# Patient Record
Sex: Male | Born: 1940
Health system: Southern US, Community
[De-identification: ages and names within clinical notes are randomized; demographics above are authoritative.]

## PROBLEM LIST (undated history)

## (undated) DIAGNOSIS — E876 Hypokalemia: Secondary | ICD-10-CM

## (undated) DIAGNOSIS — M1990 Unspecified osteoarthritis, unspecified site: Secondary | ICD-10-CM

## (undated) DIAGNOSIS — Z8679 Personal history of other diseases of the circulatory system: Secondary | ICD-10-CM

## (undated) DIAGNOSIS — I10 Essential (primary) hypertension: Secondary | ICD-10-CM

## (undated) DIAGNOSIS — K759 Inflammatory liver disease, unspecified: Secondary | ICD-10-CM

## (undated) DIAGNOSIS — M109 Gout, unspecified: Secondary | ICD-10-CM

## (undated) DIAGNOSIS — K219 Gastro-esophageal reflux disease without esophagitis: Secondary | ICD-10-CM

## (undated) DIAGNOSIS — G629 Polyneuropathy, unspecified: Secondary | ICD-10-CM

## (undated) DIAGNOSIS — N179 Acute kidney failure, unspecified: Secondary | ICD-10-CM

## (undated) DIAGNOSIS — N189 Chronic kidney disease, unspecified: Secondary | ICD-10-CM

## (undated) DIAGNOSIS — F419 Anxiety disorder, unspecified: Secondary | ICD-10-CM

## (undated) DIAGNOSIS — E119 Type 2 diabetes mellitus without complications: Secondary | ICD-10-CM

## (undated) DIAGNOSIS — M519 Unspecified thoracic, thoracolumbar and lumbosacral intervertebral disc disorder: Secondary | ICD-10-CM

## (undated) DIAGNOSIS — I480 Paroxysmal atrial fibrillation: Secondary | ICD-10-CM

## (undated) DIAGNOSIS — N400 Enlarged prostate without lower urinary tract symptoms: Secondary | ICD-10-CM

## (undated) DIAGNOSIS — G4730 Sleep apnea, unspecified: Secondary | ICD-10-CM

## (undated) DIAGNOSIS — E860 Dehydration: Secondary | ICD-10-CM

## (undated) DIAGNOSIS — D649 Anemia, unspecified: Secondary | ICD-10-CM

## (undated) DIAGNOSIS — E785 Hyperlipidemia, unspecified: Secondary | ICD-10-CM

## (undated) DIAGNOSIS — H269 Unspecified cataract: Secondary | ICD-10-CM

## (undated) DIAGNOSIS — J45909 Unspecified asthma, uncomplicated: Secondary | ICD-10-CM

## (undated) DIAGNOSIS — T7840XA Allergy, unspecified, initial encounter: Secondary | ICD-10-CM

## (undated) DIAGNOSIS — I509 Heart failure, unspecified: Secondary | ICD-10-CM

## (undated) DIAGNOSIS — I2510 Atherosclerotic heart disease of native coronary artery without angina pectoris: Secondary | ICD-10-CM

## (undated) DIAGNOSIS — J449 Chronic obstructive pulmonary disease, unspecified: Secondary | ICD-10-CM

## (undated) DIAGNOSIS — L03211 Cellulitis of face: Secondary | ICD-10-CM

## (undated) DIAGNOSIS — IMO0001 Reserved for inherently not codable concepts without codable children: Secondary | ICD-10-CM

## (undated) DIAGNOSIS — M353 Polymyalgia rheumatica: Secondary | ICD-10-CM

## (undated) DIAGNOSIS — C649 Malignant neoplasm of unspecified kidney, except renal pelvis: Secondary | ICD-10-CM

## (undated) DIAGNOSIS — E669 Obesity, unspecified: Secondary | ICD-10-CM

## (undated) HISTORY — DX: Essential (primary) hypertension: I10

## (undated) HISTORY — PX: CARDIAC CATHETERIZATION: SHX172

## (undated) HISTORY — DX: Anemia, unspecified: D64.9

## (undated) HISTORY — DX: Unspecified thoracic, thoracolumbar and lumbosacral intervertebral disc disorder: M51.9

## (undated) HISTORY — DX: Type 2 diabetes mellitus without complications: E11.9

## (undated) HISTORY — PX: APPENDECTOMY: SHX54

## (undated) HISTORY — DX: Hyperlipidemia, unspecified: E78.5

## (undated) HISTORY — DX: Obesity, unspecified: E66.9

## (undated) HISTORY — DX: Gout, unspecified: M10.9

## (undated) HISTORY — DX: Unspecified asthma, uncomplicated: J45.909

## (undated) HISTORY — DX: Unspecified osteoarthritis, unspecified site: M19.90

## (undated) HISTORY — DX: Allergy, unspecified, initial encounter: T78.40XA

## (undated) HISTORY — DX: Heart failure, unspecified: I50.9

## (undated) HISTORY — DX: Hypokalemia: E87.6

## (undated) HISTORY — DX: Chronic obstructive pulmonary disease, unspecified: J44.9

## (undated) HISTORY — DX: Acute kidney failure, unspecified: N17.9

## (undated) HISTORY — DX: Personal history of other diseases of the circulatory system: Z86.79

## (undated) HISTORY — DX: Gastro-esophageal reflux disease without esophagitis: K21.9

## (undated) HISTORY — PX: TRANSURETHRAL RESECTION OF PROSTATE: SHX73

## (undated) HISTORY — DX: Polyneuropathy, unspecified: G62.9

## (undated) HISTORY — DX: Paroxysmal atrial fibrillation: I48.0

## (undated) HISTORY — DX: Polymyalgia rheumatica: M35.3

## (undated) HISTORY — DX: Benign prostatic hyperplasia without lower urinary tract symptoms: N40.0

## (undated) HISTORY — PX: HERNIA REPAIR: SHX51

## (undated) HISTORY — DX: Dehydration: E86.0

## (undated) HISTORY — DX: Cellulitis of face: L03.211

## (undated) HISTORY — DX: Unspecified cataract: H26.9

## (undated) HISTORY — PX: PROSTATE SURGERY: SHX751

---

## 1995-03-17 HISTORY — PX: CORONARY ARTERY BYPASS GRAFT: SHX141

## 2008-07-01 ENCOUNTER — Emergency Department (HOSPITAL_COMMUNITY): Admission: EM | Admit: 2008-07-01 | Discharge: 2008-07-01 | Payer: Self-pay | Admitting: Emergency Medicine

## 2008-07-02 ENCOUNTER — Emergency Department (HOSPITAL_COMMUNITY): Admission: EM | Admit: 2008-07-02 | Discharge: 2008-07-02 | Payer: Self-pay | Admitting: Emergency Medicine

## 2008-11-02 ENCOUNTER — Emergency Department (HOSPITAL_COMMUNITY): Admission: EM | Admit: 2008-11-02 | Discharge: 2008-11-02 | Payer: Self-pay | Admitting: Emergency Medicine

## 2008-11-02 ENCOUNTER — Encounter (INDEPENDENT_AMBULATORY_CARE_PROVIDER_SITE_OTHER): Payer: Self-pay | Admitting: Emergency Medicine

## 2008-11-02 ENCOUNTER — Ambulatory Visit: Payer: Self-pay | Admitting: Vascular Surgery

## 2010-06-19 ENCOUNTER — Emergency Department (HOSPITAL_COMMUNITY)
Admission: EM | Admit: 2010-06-19 | Discharge: 2010-06-20 | Disposition: A | Discharge status: Home / Self Care | Payer: Medicare Other | Attending: Emergency Medicine | Admitting: Emergency Medicine

## 2010-06-19 ENCOUNTER — Emergency Department (HOSPITAL_COMMUNITY): Payer: Medicare Other

## 2010-06-19 DIAGNOSIS — I10 Essential (primary) hypertension: Secondary | ICD-10-CM | POA: Insufficient documentation

## 2010-06-19 DIAGNOSIS — M129 Arthropathy, unspecified: Secondary | ICD-10-CM | POA: Insufficient documentation

## 2010-06-19 DIAGNOSIS — IMO0001 Reserved for inherently not codable concepts without codable children: Secondary | ICD-10-CM | POA: Insufficient documentation

## 2010-06-19 DIAGNOSIS — R0602 Shortness of breath: Secondary | ICD-10-CM | POA: Insufficient documentation

## 2010-06-19 DIAGNOSIS — R05 Cough: Secondary | ICD-10-CM | POA: Insufficient documentation

## 2010-06-19 DIAGNOSIS — R0982 Postnasal drip: Secondary | ICD-10-CM | POA: Insufficient documentation

## 2010-06-19 DIAGNOSIS — D869 Sarcoidosis, unspecified: Secondary | ICD-10-CM | POA: Insufficient documentation

## 2010-06-19 DIAGNOSIS — R059 Cough, unspecified: Secondary | ICD-10-CM | POA: Insufficient documentation

## 2010-06-19 DIAGNOSIS — E119 Type 2 diabetes mellitus without complications: Secondary | ICD-10-CM | POA: Insufficient documentation

## 2010-06-20 ENCOUNTER — Emergency Department (HOSPITAL_COMMUNITY): Payer: Medicare Other

## 2010-06-20 LAB — GLUCOSE, CAPILLARY: Glucose-Capillary: 135 mg/dL — ABNORMAL HIGH (ref 70–99)

## 2010-06-21 LAB — COMPREHENSIVE METABOLIC PANEL
ALT: 19 U/L (ref 0–53)
AST: 18 U/L (ref 0–37)
Albumin: 3.7 g/dL (ref 3.5–5.2)
Alkaline Phosphatase: 102 U/L (ref 39–117)
BUN: 15 mg/dL (ref 6–23)
CO2: 27 mEq/L (ref 19–32)
Calcium: 9.2 mg/dL (ref 8.4–10.5)
Chloride: 101 mEq/L (ref 96–112)
Creatinine, Ser: 1.39 mg/dL (ref 0.4–1.5)
GFR calc Af Amer: >60 mL/min (ref >60)
GFR calc non Af Amer: 51 mL/min — ABNORMAL LOW (ref >60)
Glucose, Bld: 228 mg/dL — ABNORMAL HIGH (ref 70–99)
Potassium: 3.5 mEq/L (ref 3.5–5.1)
Sodium: 136 mEq/L (ref 135–145)
Total Bilirubin: 1.1 mg/dL (ref 0.3–1.2)
Total Protein: 7.5 g/dL (ref 6.0–8.3)

## 2010-06-21 LAB — DIFFERENTIAL
Basophils Absolute: 0 10*3/uL (ref 0.0–0.1)
Basophils Relative: 0 % (ref 0–1)
Eosinophils Absolute: 0.2 10*3/uL (ref 0.0–0.7)
Eosinophils Relative: 2 % (ref 0–5)
Lymphocytes Relative: 14 % (ref 12–46)
Lymphs Abs: 1.1 10*3/uL (ref 0.7–4.0)
Monocytes Absolute: 0.7 10*3/uL (ref 0.1–1.0)
Monocytes Relative: 9 % (ref 3–12)
Neutro Abs: 5.8 10*3/uL (ref 1.7–7.7)
Neutrophils Relative %: 75 % (ref 43–77)

## 2010-06-21 LAB — CBC
HCT: 32.8 % — ABNORMAL LOW (ref 39.0–52.0)
Hemoglobin: 11.6 g/dL — ABNORMAL LOW (ref 13.0–17.0)
MCHC: 35.4 g/dL (ref 30.0–36.0)
MCV: 95.4 fL (ref 78.0–100.0)
Platelets: 272 10*3/uL (ref 150–400)
RBC: 3.43 MIL/uL — ABNORMAL LOW (ref 4.22–5.81)
RDW: 12.2 % (ref 11.5–15.5)
WBC: 7.8 10*3/uL (ref 4.0–10.5)

## 2010-06-21 LAB — POCT CARDIAC MARKERS
CKMB, poc: <1 ng/mL — ABNORMAL LOW (ref 1.0–8.0)
Myoglobin, poc: 71.8 ng/mL (ref 12–200)
Troponin i, poc: <0.05 ng/mL (ref 0.00–0.09)

## 2010-06-21 LAB — CK: Total CK: 32 U/L (ref 7–232)

## 2010-06-25 LAB — RAPID URINE DRUG SCREEN, HOSP PERFORMED
Amphetamines: NOT DETECTED
Barbiturates: NOT DETECTED
Benzodiazepines: POSITIVE — AB
Cocaine: NOT DETECTED
Opiates: POSITIVE — AB
Tetrahydrocannabinol: NOT DETECTED

## 2010-06-25 LAB — POCT I-STAT, CHEM 8
BUN: 27 mg/dL — ABNORMAL HIGH (ref 6–23)
Calcium, Ion: 1.14 mmol/L (ref 1.12–1.32)
Chloride: 103 mEq/L (ref 96–112)
Creatinine, Ser: 1.6 mg/dL — ABNORMAL HIGH (ref 0.4–1.5)
Glucose, Bld: 239 mg/dL — ABNORMAL HIGH (ref 70–99)
HCT: 36 % — ABNORMAL LOW (ref 39.0–52.0)
Hemoglobin: 12.2 g/dL — ABNORMAL LOW (ref 13.0–17.0)
Potassium: 3.2 mEq/L — ABNORMAL LOW (ref 3.5–5.1)
Sodium: 138 mEq/L (ref 135–145)
TCO2: 24 mmol/L (ref 0–100)

## 2010-06-25 LAB — GLUCOSE, CAPILLARY
Glucose-Capillary: 195 mg/dL — ABNORMAL HIGH (ref 70–99)
Glucose-Capillary: 219 mg/dL — ABNORMAL HIGH (ref 70–99)

## 2010-06-25 LAB — ETHANOL: Alcohol, Ethyl (B): <5 mg/dL (ref 0–10)

## 2011-01-26 ENCOUNTER — Ambulatory Visit: Payer: Non-veteran care | Attending: Rheumatology

## 2011-01-26 DIAGNOSIS — M2550 Pain in unspecified joint: Secondary | ICD-10-CM | POA: Insufficient documentation

## 2011-01-26 DIAGNOSIS — M542 Cervicalgia: Secondary | ICD-10-CM | POA: Insufficient documentation

## 2011-01-26 DIAGNOSIS — R5381 Other malaise: Secondary | ICD-10-CM | POA: Insufficient documentation

## 2011-01-26 DIAGNOSIS — M25519 Pain in unspecified shoulder: Secondary | ICD-10-CM | POA: Insufficient documentation

## 2011-01-26 DIAGNOSIS — IMO0001 Reserved for inherently not codable concepts without codable children: Secondary | ICD-10-CM | POA: Insufficient documentation

## 2011-01-29 ENCOUNTER — Ambulatory Visit: Payer: Non-veteran care

## 2011-02-09 ENCOUNTER — Ambulatory Visit: Payer: Non-veteran care

## 2011-02-23 ENCOUNTER — Ambulatory Visit: Payer: Non-veteran care | Attending: Rheumatology | Admitting: Physical Therapy

## 2011-02-23 DIAGNOSIS — M25519 Pain in unspecified shoulder: Secondary | ICD-10-CM | POA: Insufficient documentation

## 2011-02-23 DIAGNOSIS — M2550 Pain in unspecified joint: Secondary | ICD-10-CM | POA: Insufficient documentation

## 2011-02-23 DIAGNOSIS — IMO0001 Reserved for inherently not codable concepts without codable children: Secondary | ICD-10-CM | POA: Insufficient documentation

## 2011-02-23 DIAGNOSIS — R5381 Other malaise: Secondary | ICD-10-CM | POA: Insufficient documentation

## 2011-02-23 DIAGNOSIS — M542 Cervicalgia: Secondary | ICD-10-CM | POA: Insufficient documentation

## 2011-02-26 ENCOUNTER — Ambulatory Visit: Payer: Non-veteran care

## 2011-03-11 ENCOUNTER — Ambulatory Visit: Payer: Non-veteran care | Admitting: Physical Therapy

## 2011-03-13 ENCOUNTER — Ambulatory Visit: Payer: Non-veteran care | Admitting: Physical Therapy

## 2011-03-23 ENCOUNTER — Ambulatory Visit: Payer: Non-veteran care | Attending: Rheumatology | Admitting: Physical Therapy

## 2011-03-23 DIAGNOSIS — M25519 Pain in unspecified shoulder: Secondary | ICD-10-CM | POA: Insufficient documentation

## 2011-03-23 DIAGNOSIS — M2550 Pain in unspecified joint: Secondary | ICD-10-CM | POA: Insufficient documentation

## 2011-03-23 DIAGNOSIS — M542 Cervicalgia: Secondary | ICD-10-CM | POA: Insufficient documentation

## 2011-03-23 DIAGNOSIS — IMO0001 Reserved for inherently not codable concepts without codable children: Secondary | ICD-10-CM | POA: Insufficient documentation

## 2011-03-23 DIAGNOSIS — R5381 Other malaise: Secondary | ICD-10-CM | POA: Insufficient documentation

## 2011-03-26 ENCOUNTER — Ambulatory Visit: Payer: Non-veteran care | Admitting: Physical Therapy

## 2011-12-19 ENCOUNTER — Ambulatory Visit: Payer: Medicare Other

## 2011-12-19 ENCOUNTER — Ambulatory Visit (INDEPENDENT_AMBULATORY_CARE_PROVIDER_SITE_OTHER): Payer: Medicare Other | Admitting: Emergency Medicine

## 2011-12-19 VITALS — BP 122/70 | HR 80 | Temp 97.9°F | Resp 18 | Ht 69.5 in | Wt 217.4 lb

## 2011-12-19 DIAGNOSIS — E1169 Type 2 diabetes mellitus with other specified complication: Secondary | ICD-10-CM | POA: Insufficient documentation

## 2011-12-19 DIAGNOSIS — Z794 Long term (current) use of insulin: Secondary | ICD-10-CM | POA: Insufficient documentation

## 2011-12-19 DIAGNOSIS — G629 Polyneuropathy, unspecified: Secondary | ICD-10-CM

## 2011-12-19 DIAGNOSIS — R079 Chest pain, unspecified: Secondary | ICD-10-CM

## 2011-12-19 DIAGNOSIS — I2510 Atherosclerotic heart disease of native coronary artery without angina pectoris: Secondary | ICD-10-CM

## 2011-12-19 DIAGNOSIS — E1142 Type 2 diabetes mellitus with diabetic polyneuropathy: Secondary | ICD-10-CM | POA: Insufficient documentation

## 2011-12-19 DIAGNOSIS — M353 Polymyalgia rheumatica: Secondary | ICD-10-CM

## 2011-12-19 DIAGNOSIS — E139 Other specified diabetes mellitus without complications: Secondary | ICD-10-CM

## 2011-12-19 DIAGNOSIS — M79676 Pain in unspecified toe(s): Secondary | ICD-10-CM

## 2011-12-19 DIAGNOSIS — E119 Type 2 diabetes mellitus without complications: Secondary | ICD-10-CM

## 2011-12-19 DIAGNOSIS — M79609 Pain in unspecified limb: Secondary | ICD-10-CM

## 2011-12-19 HISTORY — DX: Atherosclerotic heart disease of native coronary artery without angina pectoris: I25.10

## 2011-12-19 HISTORY — DX: Type 2 diabetes mellitus with other specified complication: E11.69

## 2011-12-19 HISTORY — DX: Polyneuropathy, unspecified: G62.9

## 2011-12-19 LAB — POCT CBC
Granulocyte percent: 74.6 %G (ref 37–80)
HCT, POC: 43.4 % — AB (ref 43.5–53.7)
Hemoglobin: 13.5 g/dL — AB (ref 14.1–18.1)
Lymph, poc: 2.1 (ref 0.6–3.4)
MCH, POC: 30 pg (ref 27–31.2)
MCHC: 31.1 g/dL — AB (ref 31.8–35.4)
MCV: 96.4 fL (ref 80–97)
MID (cbc): 0.9 (ref 0–0.9)
MPV: 7.4 fL (ref 0–99.8)
POC Granulocyte: 8.8 — AB (ref 2–6.9)
POC LYMPH PERCENT: 17.4 %L (ref 10–50)
POC MID %: 8 %M (ref 0–12)
Platelet Count, POC: 350 10*3/uL (ref 142–424)
RBC: 4.5 M/uL — AB (ref 4.69–6.13)
RDW, POC: 17.6 %
WBC: 11.8 10*3/uL — AB (ref 4.6–10.2)

## 2011-12-19 LAB — GLUCOSE, POCT (MANUAL RESULT ENTRY): POC Glucose: 111 mg/dl — AB (ref 70–99)

## 2011-12-19 LAB — POCT SEDIMENTATION RATE: POCT SED RATE: 39 mm/hr — AB (ref 0–22)

## 2011-12-19 NOTE — Progress Notes (Deleted)
  Subjective:    Patient ID: Jacob [name], male    DOB: 08-08-1940, 71 y.o.   MRN: [name]  HPI    Review of Systems     Objective:   Physical Exam        Assessment & Plan:

## 2011-12-19 NOTE — Progress Notes (Signed)
  Subjective:    Patient ID: Jacob [name], male    DOB: [birth year], 71 y.o.   MRN: [name]  HPI patient presents with a one-week history of intermittent chest discomfort. Patient states he is fine during the day he is fine with exertion but he lays down at night he usually wakes up around 3 AM with a discomfort in his chest this is more of a chest tightness soreness that improves if he gets up and walks around. The pain only occurs when he lays down . He also has had significant pain in his right great toe but has increased his diuretics recently and feels this may be a correlation .    Review of Systems     Objective:   Physical Exam alert cooperative and not in any distress. Chest was clear. Cardiac was regular rate without murmurs rubs or gallops. Abdomen is soft and nontender to there is significant tenderness over the MTP joint of the right great toe  Results for orders placed in visit on 12/19/11  POCT CBC      Component Value Range   WBC 11.8 (*) 4.6 - 10.2 K/uL   Lymph, poc 2.1  0.6 - 3.4   POC LYMPH PERCENT 17.4  10 - 50 %L   MID (cbc) 0.9  0 - 0.9   POC MID % 8.0  0 - 12 %M   POC Granulocyte 8.8 (*) 2 - 6.9   Granulocyte percent 74.6  37 - 80 %G   RBC 4.50 (*) 4.69 - 6.13 M/uL   Hemoglobin 13.5 (*) 14.1 - 18.1 g/dL   HCT, POC 43.4 (*) 43.5 - 53.7 %   MCV 96.4  80 - 97 fL   MCH, POC 30.0  27 - 31.2 pg   MCHC 31.1 (*) 31.8 - 35.4 g/dL   RDW, POC 17.6     Platelet Count, POC 350  142 - 424 K/uL   MPV 7.4  0 - 99.8 fL  GLUCOSE, POCT (MANUAL RESULT ENTRY)      Component Value Range   POC Glucose 111 (*) 70 - 99 mg/dl    [facility] reading (PRIMARY) by  Dr.Alizeh Madril heart size is upper limits of normal rule out pericardial effusion EKG sinus arrhythmia     Assessment & Plan:  Patient symptoms are most consistent with pericarditis. Will discuss with him the possibility of him seeing the cardiologist the first week and having an echo to see if this is the diagnosis. He is going  to increase his prednisone to 20 mg a day. He is going to hold off on his diuretics. I will call him with his uric acid results. I discussed with him the possibility of going ahead to see the cardiologist but he would like to see how he does over the next couple of days and then if he continues to have trouble he would like to see Dr. Tollie Eth

## 2011-12-29 ENCOUNTER — Telehealth: Payer: Self-pay | Admitting: Radiology

## 2011-12-29 NOTE — Telephone Encounter (Signed)
Patient came in for his lab results, he wants so he can take to [place], he did not keep cardiology appt and he feels better does not want this, he advised he is trying to get away from the [place] system but is getting the results to advise them.

## 2012-02-14 ENCOUNTER — Ambulatory Visit (INDEPENDENT_AMBULATORY_CARE_PROVIDER_SITE_OTHER): Payer: Medicare Other | Admitting: Emergency Medicine

## 2012-02-14 VITALS — BP 166/94 | HR 125 | Temp 100.0°F | Resp 17 | Ht 69.0 in | Wt 210.0 lb

## 2012-02-14 DIAGNOSIS — J018 Other acute sinusitis: Secondary | ICD-10-CM

## 2012-02-14 DIAGNOSIS — N3000 Acute cystitis without hematuria: Secondary | ICD-10-CM

## 2012-02-14 DIAGNOSIS — R109 Unspecified abdominal pain: Secondary | ICD-10-CM

## 2012-02-14 DIAGNOSIS — K3189 Other diseases of stomach and duodenum: Secondary | ICD-10-CM

## 2012-02-14 DIAGNOSIS — N3090 Cystitis, unspecified without hematuria: Secondary | ICD-10-CM

## 2012-02-14 DIAGNOSIS — R822 Biliuria: Secondary | ICD-10-CM

## 2012-02-14 LAB — POCT URINALYSIS DIPSTICK
Blood, UA: NEGATIVE
Glucose, UA: NEGATIVE
Ketones, UA: 15
Leukocytes, UA: NEGATIVE
Nitrite, UA: NEGATIVE
Protein, UA: >=300
Spec Grav, UA: 1.025
Urobilinogen, UA: 4
pH, UA: 7

## 2012-02-14 LAB — COMPREHENSIVE METABOLIC PANEL
ALT: 29 U/L (ref 0–53)
AST: 19 U/L (ref 0–37)
Albumin: 3.7 g/dL (ref 3.5–5.2)
Alkaline Phosphatase: 91 U/L (ref 39–117)
BUN: 19 mg/dL (ref 6–23)
CO2: 30 mEq/L (ref 19–32)
Calcium: 9.4 mg/dL (ref 8.4–10.5)
Chloride: 100 mEq/L (ref 96–112)
Creat: 1.47 mg/dL — ABNORMAL HIGH (ref 0.50–1.35)
Glucose, Bld: 167 mg/dL — ABNORMAL HIGH (ref 70–99)
Potassium: 4.4 mEq/L (ref 3.5–5.3)
Sodium: 137 mEq/L (ref 135–145)
Total Bilirubin: 2.1 mg/dL — ABNORMAL HIGH (ref 0.3–1.2)
Total Protein: 6 g/dL (ref 6.0–8.3)

## 2012-02-14 LAB — POCT UA - MICROSCOPIC ONLY
Casts, Ur, LPF, POC: NEGATIVE
Crystals, Ur, HPF, POC: NEGATIVE
Mucus, UA: POSITIVE
Yeast, UA: NEGATIVE

## 2012-02-14 MED ORDER — LEVOFLOXACIN 500 MG PO TABS: 500.0000 mg | ORAL_TABLET | Freq: Every day | ORAL | Status: AC

## 2012-02-14 NOTE — Progress Notes (Signed)
[facility] [address], [place] [address] [phone]  Date:  02/14/2012   Name:  Jacob [name]   DOB:  Aug 27, 1940   MRN:  [name]  PCP:  Youlanda Roys, MD    Chief Complaint: Urinary Tract Infection, Sinusitis, Ear Fullness and Dizziness   History of Present Illness:  Jacob [name] is a 71 y.o. very pleasant male patient who presents with the following:  Multiple complaints.  History of urethral stricture following foley catheterization and has recurrent UTI.  Has dysuria and cloudy urine currently. Has nasal congestion and post nasal drainage  And has purulent nasal drainage.  Has sore throat.  No fever or chills  Non productive cough.  Ear pressure and congestion and dizziness when he blows his nose.  No wheezing or shortness of breath.  No nausea or vomiting.  Patient Active Problem List  Diagnosis  . CAD (coronary artery disease)  . Diabetes 1.5, managed as type 1  . Neuropathy    Past Medical History  Diagnosis Date  . Allergy   . Asthma   . Diabetes mellitus without complication     Past Surgical History  Procedure Date  . Appendectomy   . Hernia repair   . Prostate surgery     History  Substance Use Topics  . Smoking status: Former Research scientist (life sciences)  . Smokeless tobacco: Not on file  . Alcohol Use: Not on file    History reviewed. No pertinent family history.  Allergies  Allergen Reactions  . Metformin And Related     Medication list has been reviewed and updated.  Current Outpatient Prescriptions on File Prior to Visit  Medication Sig Dispense Refill  . amLODipine (NORVASC) 10 MG tablet Take 10 mg by mouth daily.      Marland Kitchen aspirin 81 MG tablet Take 81 mg by mouth daily.      . finasteride (PROSCAR) 5 MG tablet Take 5 mg by mouth daily.      . fluticasone (VERAMYST) 27.5 MCG/SPRAY nasal spray Place 2 sprays into the nose 2 (two) times daily.      . folic acid (FOLVITE) 1 MG tablet Take 1 mg by mouth daily.      Marland Kitchen gabapentin  (NEURONTIN) 300 MG capsule Take 300 mg by mouth 3 (three) times daily.      Marland Kitchen glipiZIDE (GLUCOTROL) 10 MG tablet Take 10 mg by mouth 2 (two) times daily before a meal.      . hydrochlorothiazide (HYDRODIURIL) 25 MG tablet Take 25 mg by mouth daily.      Marland Kitchen HYDROcodone-acetaminophen (NORCO/VICODIN) 5-325 MG per tablet Take 1 tablet by mouth every 6 (six) hours as needed.      . hydroxychloroquine (PLAQUENIL) 200 MG tablet Take by mouth daily.      . INSULIN GLARGINE [place] Inject 100 mg into the skin.      Marland Kitchen losartan (COZAAR) 100 MG tablet Take 100 mg by mouth daily.      . methotrexate (RHEUMATREX) 2.5 MG tablet Take 2.5 mg by mouth once a week. Caution:Chemotherapy. Protect from light.      . montelukast (SINGULAIR) 10 MG tablet Take 10 mg by mouth at bedtime.      . predniSONE (DELTASONE) 5 MG tablet Take 5 mg by mouth daily.      . Tamsulosin HCl (FLOMAX) 0.4 MG CAPS Take by mouth.        Review of Systems:  As per HPI, otherwise negative.  Physical Examination: Filed Vitals:   02/14/12 1105  BP: 166/94  Pulse: 125  Temp: 100 F (37.8 C)  Resp: 17   Filed Vitals:   02/14/12 1105  Height: 5\' 9"  (1.753 m)  Weight: 210 lb (95.255 kg)   Body mass index is 31.01 kg/(m^2). Ideal Body Weight: Weight in (lb) to have BMI = 25: 168.9   GEN: WDWN, NAD, Non-toxic, A & O x 3  No rash or shortness of breath. HEENT: Atraumatic, Normocephalic. Neck supple. No masses, No LAD.  Oropharynx negative Ears and Nose: No external deformity.  TM negative  Purulent nasal drainage CV: RRR, No M/G/R. No JVD. No thrill. No extra heart sounds. PULM: CTA B, no wheezes, crackles, rhonchi. No retractions. No resp. distress. No accessory muscle use. ABD: S, NT, ND, +BS. No rebound. No HSM. EXTR: No c/c/e NEURO Normal gait.  PSYCH: Normally interactive. Conversant. Not depressed or anxious appearing.  Calm demeanor.    Assessment and Plan: Sinusitis Bilirubinuria Cystitis levaquin Labs Urine  culture Follow up in one week   Roselee Culver, MD  Results for orders placed in visit on 02/14/12  POCT UA - MICROSCOPIC ONLY      Component Value Range   WBC, Ur, HPF, POC TNTC     RBC, urine, microscopic TNTC     Bacteria, U Microscopic 1+     Mucus, UA positive     Epithelial cells, urine per micros 3-8     Crystals, Ur, HPF, POC neg     Casts, Ur, LPF, POC neg     Yeast, UA neg    POCT URINALYSIS DIPSTICK      Component Value Range   Color, UA orange     Clarity, UA clear     Glucose, UA neg     Bilirubin, UA moderate     Ketones, UA 15     Spec Grav, UA 1.025     Blood, UA neg     pH, UA 7.0     Protein, UA >=300     Urobilinogen, UA 4.0     Nitrite, UA neg     Leukocytes, UA Negative

## 2012-02-15 ENCOUNTER — Telehealth: Payer: Self-pay

## 2012-02-15 LAB — URINE CULTURE
Colony Count: NO GROWTH
Organism ID, Bacteria: NO GROWTH

## 2012-02-15 NOTE — Telephone Encounter (Signed)
Please review labs. 

## 2012-02-15 NOTE — Telephone Encounter (Signed)
PATIENTS WIFE CALLED IN REGARDS TO LAB RESULTS. PATIENT NOTIFIED THAT IT STILL NEEDS TO BE REVIEWED BY A PHYSICIAN PER [facility]. THANK YOU!

## 2012-02-17 ENCOUNTER — Telehealth: Payer: Self-pay

## 2012-02-17 NOTE — Telephone Encounter (Signed)
Pt is needing to talk with someone about his lab results  Best number to call back [phone]

## 2012-02-18 ENCOUNTER — Telehealth: Payer: Self-pay | Admitting: *Deleted

## 2012-02-18 NOTE — Telephone Encounter (Signed)
Per Dr. Ouida Sills pt should RTC.  Spoke with pt and he will RTC on Sunday

## 2012-02-18 NOTE — Telephone Encounter (Signed)
Per Dr. Ouida Sills labs were okay and blood sugar was a little high.  Pt states that he is having lower abdominal pain.  His urine culture was negative.  He wanted to know if that could be coming from the antibiotic since the urine culture was negative?  His urine looked pretty yucky here in the office.

## 2012-02-18 NOTE — Telephone Encounter (Signed)
Dr Ouida Sills, can you please comment on pt's labs so that we may call him?

## 2012-03-25 ENCOUNTER — Ambulatory Visit (INDEPENDENT_AMBULATORY_CARE_PROVIDER_SITE_OTHER): Payer: Medicare Other | Admitting: Emergency Medicine

## 2012-03-25 VITALS — BP 126/69 | HR 94 | Temp 98.2°F | Resp 16 | Ht 71.0 in | Wt 223.0 lb

## 2012-03-25 DIAGNOSIS — R809 Proteinuria, unspecified: Secondary | ICD-10-CM

## 2012-03-25 DIAGNOSIS — R82998 Other abnormal findings in urine: Secondary | ICD-10-CM

## 2012-03-25 DIAGNOSIS — J329 Chronic sinusitis, unspecified: Secondary | ICD-10-CM

## 2012-03-25 DIAGNOSIS — R8281 Pyuria: Secondary | ICD-10-CM

## 2012-03-25 LAB — POCT URINALYSIS DIPSTICK
Blood, UA: NEGATIVE
Glucose, UA: NEGATIVE
Leukocytes, UA: NEGATIVE
Nitrite, UA: NEGATIVE
Protein, UA: >=300
Spec Grav, UA: >=1.03
Urobilinogen, UA: 1
pH, UA: 5

## 2012-03-25 LAB — POCT UA - MICROSCOPIC ONLY
Casts, Ur, LPF, POC: NEGATIVE
Crystals, Ur, HPF, POC: NEGATIVE
Epithelial cells, urine per micros: NEGATIVE
Mucus, UA: NEGATIVE
Yeast, UA: NEGATIVE

## 2012-03-25 MED ORDER — AMOXICILLIN-POT CLAVULANATE 875-125 MG PO TABS: 1.0000 | ORAL_TABLET | Freq: Two times a day (BID) | ORAL | Status: DC

## 2012-03-25 NOTE — Patient Instructions (Addendum)
Please do not take your methotrexate until you talk to your rheumatologist and get the OK. that you can take methotrexate and Augmentin together.Sinusitis Sinusitis is redness, soreness, and swelling (inflammation) of the paranasal sinuses. Paranasal sinuses are air pockets within the bones of your face (beneath the eyes, the middle of the forehead, or above the eyes). In healthy paranasal sinuses, mucus is able to drain out, and air is able to circulate through them by way of your nose. However, when your paranasal sinuses are inflamed, mucus and air can become trapped. This can allow bacteria and other germs to grow and cause infection. Sinusitis can develop quickly and last only a short time (acute) or continue over a long period (chronic). Sinusitis that lasts for more than 12 weeks is considered chronic.  CAUSES  Causes of sinusitis include:  Allergies.  Structural abnormalities, such as displacement of the cartilage that separates your nostrils (deviated septum), which can decrease the air flow through your nose and sinuses and affect sinus drainage.  Functional abnormalities, such as when the small hairs (cilia) that line your sinuses and help remove mucus do not work properly or are not present. SYMPTOMS  Symptoms of acute and chronic sinusitis are the same. The primary symptoms are pain and pressure around the affected sinuses. Other symptoms include:  Upper toothache.  Earache.  Headache.  Bad breath.  Decreased sense of smell and taste.  A cough, which worsens when you are lying flat.  Fatigue.  Fever.  Thick drainage from your nose, which often is green and may contain pus (purulent).  Swelling and warmth over the affected sinuses. DIAGNOSIS  Your caregiver will perform a physical exam. During the exam, your caregiver may:  Look in your nose for signs of abnormal growths in your nostrils (nasal polyps).  Tap over the affected sinus to check for signs of  infection.  View the inside of your sinuses (endoscopy) with a special imaging device with a light attached (endoscope), which is inserted into your sinuses. If your caregiver suspects that you have chronic sinusitis, one or more of the following tests may be recommended:  Allergy tests.  Nasal culture A sample of mucus is taken from your nose and sent to a lab and screened for bacteria.  Nasal cytology A sample of mucus is taken from your nose and examined by your caregiver to determine if your sinusitis is related to an allergy. TREATMENT  Most cases of acute sinusitis are related to a viral infection and will resolve on their own within 10 days. Sometimes medicines are prescribed to help relieve symptoms (pain medicine, decongestants, nasal steroid sprays, or saline sprays).  However, for sinusitis related to a bacterial infection, your caregiver will prescribe antibiotic medicines. These are medicines that will help kill the bacteria causing the infection.  Rarely, sinusitis is caused by a fungal infection. In theses cases, your caregiver will prescribe antifungal medicine. For some cases of chronic sinusitis, surgery is needed. Generally, these are cases in which sinusitis recurs more than 3 times per year, despite other treatments. HOME CARE INSTRUCTIONS   Drink plenty of water. Water helps thin the mucus so your sinuses can drain more easily.  Use a humidifier.  Inhale steam 3 to 4 times a day (for example, sit in the bathroom with the shower running).  Apply a warm, moist washcloth to your face 3 to 4 times a day, or as directed by your caregiver.  Use saline nasal sprays to help moisten and  clean your sinuses.  Take over-the-counter or prescription medicines for pain, discomfort, or fever only as directed by your caregiver. SEEK IMMEDIATE MEDICAL CARE IF:  You have increasing pain or severe headaches.  You have nausea, vomiting, or drowsiness.  You have swelling around your  face.  You have vision problems.  You have a stiff neck.  You have difficulty breathing. MAKE SURE YOU:   Understand these instructions.  Will watch your condition.  Will get help right away if you are not doing well or get worse. Document Released: 03/02/2005 Document Revised: 05/25/2011 Document Reviewed: 03/17/2011 [facility] Patient Information 2013 [place].

## 2012-03-25 NOTE — Progress Notes (Signed)
  Subjective:    Patient ID: Jacob [name], male    DOB: 06/10/1940, 72 y.o.   MRN: [name]  HPI Pt is complaining of a sinus infection x 1 month. He is getting a lot of mucus out in the mornings. He gets these quite frequently. He is having a lot of nasal drainage and facial pain. In the mornings he has a lot of dried blood from his nose for about an hour, then he gets fresh blood draining out.    Review of Systems     Objective:   Physical Exam HEENT exam reveals nasal congestion with tenderness over the maxillary area. Posterior pharynx is normal his chest is clear.    Results for orders placed in visit on 03/25/12  POCT UA - MICROSCOPIC ONLY      Component Value Range   WBC, Ur, HPF, POC 1-3     RBC, urine, microscopic 1-3     Bacteria, U Microscopic trace     Mucus, UA neg     Epithelial cells, urine per micros neg     Crystals, Ur, HPF, POC neg     Casts, Ur, LPF, POC neg     Yeast, UA neg    POCT URINALYSIS DIPSTICK      Component Value Range   Color, UA yellow     Clarity, UA clear     Glucose, UA neg     Bilirubin, UA small     Ketones, UA trace     Spec Grav, UA >=1.030     Blood, UA neg     pH, UA 5.0     Protein, UA >=300     Urobilinogen, UA 1.0     Nitrite, UA neg     Leukocytes, UA Negative       Assessment & Plan:  Patient has signs and symptoms of acute sinusitis. We'll treat with Augmentin. He is going to check with his rheumatologist about taking methotrexate and Augmentin together.

## 2012-04-13 ENCOUNTER — Ambulatory Visit: Payer: Medicare Other

## 2012-04-13 ENCOUNTER — Ambulatory Visit (INDEPENDENT_AMBULATORY_CARE_PROVIDER_SITE_OTHER): Payer: Medicare Other | Admitting: Family Medicine

## 2012-04-13 VITALS — BP 126/74 | HR 90 | Temp 98.1°F | Resp 16 | Ht 71.0 in | Wt 222.8 lb

## 2012-04-13 DIAGNOSIS — J3489 Other specified disorders of nose and nasal sinuses: Secondary | ICD-10-CM

## 2012-04-13 DIAGNOSIS — M069 Rheumatoid arthritis, unspecified: Secondary | ICD-10-CM

## 2012-04-13 DIAGNOSIS — L0292 Furuncle, unspecified: Secondary | ICD-10-CM

## 2012-04-13 HISTORY — DX: Rheumatoid arthritis, unspecified: M06.9

## 2012-04-13 MED ORDER — DOXYCYCLINE HYCLATE 100 MG PO TABS: 100.0000 mg | ORAL_TABLET | Freq: Two times a day (BID) | ORAL | Status: DC

## 2012-04-13 NOTE — Progress Notes (Signed)
[facility] [address], [place] [address] [phone]  Date:  04/13/2012   Name:  Jacob [name]   DOB:  December 16, 1940   MRN:  [name]  PCP:  Youlanda Roys, MD    Chief Complaint: Facial Pain   History of Present Illness:  Jacob [name] is a 72 y.o. very pleasant male patient who presents with the following:  He is here today with sinus symptoms.  He was here on 1/10 with sinus congestion for about one month, he was treated with augmentin. However, he did not feel that he ever got better, and over the last 2 or 3 days he notes more tenderness in the left side of his face under his left eye.  He also has a "boil" on the right side of his face which has been present for the last 3 days.  He is using hot compresses for this.  He does report a history of MRSA, and has needed to have boils lanced in the past  He does not note any fever.  He does note a slight cough.  He feels that his vision is about the same as normal- he does see an eye doctor regularly.  He notes that he feels sore behind the left eye, but the globe itself seems normal.  He has RA as well.    Patient Active Problem List  Diagnosis  . CAD (coronary artery disease)  . Diabetes 1.5, managed as type 1  . Neuropathy    Past Medical History  Diagnosis Date  . Allergy   . Asthma   . Diabetes mellitus without complication     Past Surgical History  Procedure Date  . Appendectomy   . Hernia repair   . Prostate surgery     History  Substance Use Topics  . Smoking status: Former Research scientist (life sciences)  . Smokeless tobacco: Not on file  . Alcohol Use: Not on file    No family history on file.  Allergies  Allergen Reactions  . Ace Inhibitors   . Amoxicillin   . Augmentin (Amoxicillin-Pot Clavulanate)   . Ciprocinonide (Fluocinolone)   . Flunisolide   . Metformin And Related   . Sertraline   . Sulindac   . Terazosin     Medication list has been reviewed and updated.  Current Outpatient  Prescriptions on File Prior to Visit  Medication Sig Dispense Refill  . amLODipine (NORVASC) 10 MG tablet Take 10 mg by mouth daily.      Marland Kitchen aspirin 81 MG tablet Take 81 mg by mouth daily.      . finasteride (PROSCAR) 5 MG tablet Take 5 mg by mouth daily.      . fluticasone (VERAMYST) 27.5 MCG/SPRAY nasal spray Place 2 sprays into the nose 2 (two) times daily.      . folic acid (FOLVITE) 1 MG tablet Take 1 mg by mouth daily.      Marland Kitchen gabapentin (NEURONTIN) 300 MG capsule Take 300 mg by mouth 3 (three) times daily.      Marland Kitchen glipiZIDE (GLUCOTROL) 10 MG tablet Take 10 mg by mouth 2 (two) times daily before a meal.      . hydrochlorothiazide (HYDRODIURIL) 25 MG tablet Take 25 mg by mouth daily.      Marland Kitchen HYDROcodone-acetaminophen (NORCO/VICODIN) 5-325 MG per tablet Take 1 tablet by mouth every 6 (six) hours as needed.      . hydroxychloroquine (PLAQUENIL) 200 MG tablet Take by mouth daily.      Marland Kitchen  INSULIN GLARGINE [place] Inject 100 mg into the skin.      Marland Kitchen losartan (COZAAR) 100 MG tablet Take 100 mg by mouth daily.      . methotrexate (RHEUMATREX) 2.5 MG tablet Take 2.5 mg by mouth once a week. Caution:Chemotherapy. Protect from light.      . predniSONE (DELTASONE) 5 MG tablet Take 5 mg by mouth daily.      . Tamsulosin HCl (FLOMAX) 0.4 MG CAPS Take by mouth.      Marland Kitchen amoxicillin-clavulanate (AUGMENTIN) 875-125 MG per tablet Take 1 tablet by mouth 2 (two) times daily.  20 tablet  0  . montelukast (SINGULAIR) 10 MG tablet Take 10 mg by mouth at bedtime.        Review of Systems:  As per HPI- otherwise negative.   Physical Examination: Filed Vitals:   04/13/12 0855  BP: 126/74  Pulse: 90  Temp: 98.1 F (36.7 C)  Resp: 16   Filed Vitals:   04/13/12 0855  Height: 5\' 11"  (1.803 m)  Weight: 222 lb 12.8 oz (101.061 kg)   Body mass index is 31.07 kg/(m^2). Ideal Body Weight: Weight in (lb) to have BMI = 25: 178.9   GEN: WDWN, NAD, Non-toxic, A & O x 3 HEENT: Atraumatic, Normocephalic. Neck supple.  No masses, No LAD. Bilateral TM wnl, oropharynx normal.  PEERL,EOMI.   He has no upper teeth- wears a complete maxillary denture.  Notes some tenderness under his left eye over the maxillary sinus.  The globe in normal in appearance.  No tenderness over his temple/ temporal artery There is a small boil along the right jawline.  It is not fluctuant and does not need I and D at this time  Ears and Nose: No external deformity. CV: RRR, No M/G/R. No JVD. No thrill. No extra heart sounds. PULM: CTA B, no wheezes, crackles, rhonchi. No retractions. No resp. distress. No accessory muscle use. EXTR: No c/c/e NEURO Normal gait.  PSYCH: Normally interactive. Conversant. Not depressed or anxious appearing.  Calm demeanor.   [facility] reading (PRIMARY) by  Dr. Lorelei Pont. Sinus film: negative PARANASAL SINUSES - COMPLETE 3 + VIEW  Comparison: None.  Findings: There is a mucoperiosteal thickening involving the maxillary sinuses but no air-fluid levels to suggest acute sinusitis. The frontal, ethmoid and sphenoid sinuses appear clear.  IMPRESSION:  1. Suspect mucoperiosteal thickening involving both maxillary sinuses but no air-fluid levels. 2. The remaining paranasal sinuses appear clear.  Assessment and Plan: 1. Sinus pain  DG Sinuses Complete, doxycycline (VIBRA-TABS) 100 MG tablet  2. Rheumatoid arthritis    3. Boil  doxycycline (VIBRA-TABS) 100 MG tablet   Suspect persistent sinusitis;  Will treat with doxycycline.  However, as his symptoms have been persistent cautioned him that we need to follow- up closely if not better, and consider a CT as the next step. He agreed to call me if not improved by Friday am, and to seek care sooner if worse.    Lamar Blinks, MD

## 2012-04-13 NOTE — Patient Instructions (Addendum)
Please start on the antiobiotic as directed.  If you are not better in the next 2 days please call and we will do a CT scan. Let me know sooner if worse, or if you start to lose any vision in your left eye.

## 2012-05-21 ENCOUNTER — Ambulatory Visit (INDEPENDENT_AMBULATORY_CARE_PROVIDER_SITE_OTHER): Payer: Medicare Other | Admitting: Family Medicine

## 2012-05-21 VITALS — BP 160/80 | HR 100 | Temp 98.5°F | Resp 18 | Ht 71.0 in | Wt 218.0 lb

## 2012-05-21 DIAGNOSIS — E1149 Type 2 diabetes mellitus with other diabetic neurological complication: Secondary | ICD-10-CM

## 2012-05-21 DIAGNOSIS — R112 Nausea with vomiting, unspecified: Secondary | ICD-10-CM

## 2012-05-21 DIAGNOSIS — IMO0001 Reserved for inherently not codable concepts without codable children: Secondary | ICD-10-CM

## 2012-05-21 DIAGNOSIS — E1140 Type 2 diabetes mellitus with diabetic neuropathy, unspecified: Secondary | ICD-10-CM

## 2012-05-21 DIAGNOSIS — E1142 Type 2 diabetes mellitus with diabetic polyneuropathy: Secondary | ICD-10-CM

## 2012-05-21 DIAGNOSIS — R42 Dizziness and giddiness: Secondary | ICD-10-CM

## 2012-05-21 LAB — POCT URINALYSIS DIPSTICK
Blood, UA: NEGATIVE
Glucose, UA: NEGATIVE
Ketones, UA: 15
Leukocytes, UA: NEGATIVE
Nitrite, UA: NEGATIVE
Protein, UA: 300
Spec Grav, UA: >=1.03
Urobilinogen, UA: 2
pH, UA: 6.5

## 2012-05-21 LAB — POCT CBC
Granulocyte percent: 80.3 %G — AB (ref 37–80)
HCT, POC: 44.2 % (ref 43.5–53.7)
Hemoglobin: 13.9 g/dL — AB (ref 14.1–18.1)
Lymph, poc: 1.4 (ref 0.6–3.4)
MCH, POC: 30.5 pg (ref 27–31.2)
MCHC: 31.4 g/dL — AB (ref 31.8–35.4)
MCV: 96.9 fL (ref 80–97)
MID (cbc): 0.6 (ref 0–0.9)
MPV: 7.6 fL (ref 0–99.8)
POC Granulocyte: 8.1 — AB (ref 2–6.9)
POC LYMPH PERCENT: 14 %L (ref 10–50)
POC MID %: 5.7 %M (ref 0–12)
Platelet Count, POC: 285 10*3/uL (ref 142–424)
RBC: 4.56 M/uL — AB (ref 4.69–6.13)
RDW, POC: 15.5 %
WBC: 10.1 10*3/uL (ref 4.6–10.2)

## 2012-05-21 LAB — POCT UA - MICROSCOPIC ONLY
Casts, Ur, LPF, POC: NEGATIVE
Crystals, Ur, HPF, POC: NEGATIVE
Mucus, UA: POSITIVE
Yeast, UA: NEGATIVE

## 2012-05-21 MED ORDER — ONDANSETRON 4 MG PO TBDP
4.0000 mg | ORAL_TABLET | Freq: Once | ORAL | Status: AC
  Administered 2012-05-21: 4 mg via ORAL

## 2012-05-21 MED ORDER — GABAPENTIN 300 MG PO CAPS: 300.0000 mg | ORAL_CAPSULE | Freq: Three times a day (TID) | ORAL | Status: DC

## 2012-05-21 MED ORDER — ONDANSETRON 4 MG PO TBDP: ORAL_TABLET | ORAL | Status: DC

## 2012-05-21 NOTE — Progress Notes (Signed)
Subjective: Patient started getting his Thursday. He had nausea and vomiting. Then nauseated since. He has not been able to eat much but he has been a chronic drinking feels like he is urinating adequately. He is diabetic, and his sugar was 180 this morning. He formally been going to the [place] in [place], but is in the process of transferring his care to Dr. Nyoka Cowden here. He has been out of his gabapentin 4 days, and wondered whether that could of triggered some of his symptoms. He has had some dizziness. Glucose 180 this am  Objective: Pleasant gentleman in no major distress. His TMs are normal. Throat clear. Mucous membranes moist. Neck supple without significant nodes. Chest is clear to auscultation. Heart regular without murmurs gallops or arrhythmias, a little bit tachycardic. Abdomen has bowel sounds present. Is soft. Some generalized upper abdominal tenderness to a mild degree, which he did not realize he had until I palpated him. Extremities normal.  Assessment: Nausea and vomiting Type 2 diabetes mellitus Abrupt discontinuation of gabapentin  Plan: Check CBC and UA Results for orders placed in visit on 05/21/12  POCT CBC      Result Value Range   WBC 10.1  4.6 - 10.2 K/uL   Lymph, poc 1.4  0.6 - 3.4   POC LYMPH PERCENT 14.0  10 - 50 %L   MID (cbc) 0.6  0 - 0.9   POC MID % 5.7  0 - 12 %M   POC Granulocyte 8.1 (*) 2 - 6.9   Granulocyte percent 80.3 (*) 37 - 80 %G   RBC 4.56 (*) 4.69 - 6.13 M/uL   Hemoglobin 13.9 (*) 14.1 - 18.1 g/dL   HCT, POC 44.2  43.5 - 53.7 %   MCV 96.9  80 - 97 fL   MCH, POC 30.5  27 - 31.2 pg   MCHC 31.4 (*) 31.8 - 35.4 g/dL   RDW, POC 15.5     Platelet Count, POC 285  142 - 424 K/uL   MPV 7.6  0 - 99.8 fL  POCT UA - MICROSCOPIC ONLY      Result Value Range   WBC, Ur, HPF, POC 1-5     RBC, urine, microscopic 2-4     Bacteria, U Microscopic 1+     Mucus, UA pos     Epithelial cells, urine per micros 0-1     Crystals, Ur, HPF, POC neg     Casts, Ur, LPF,  POC neg     Yeast, UA neg    POCT URINALYSIS DIPSTICK      Result Value Range   Color, UA yellow     Clarity, UA clear     Glucose, UA neg     Bilirubin, UA small     Ketones, UA 15     Spec Grav, UA >=1.030     Blood, UA neg     pH, UA 6.5     Protein, UA 300     Urobilinogen, UA 2.0     Nitrite, UA neg     Leukocytes, UA Negative     Assessment: Nausea and vomiting Diabetes Dizziness Peripheral neuropathy secondary to diabetes

## 2012-05-21 NOTE — Patient Instructions (Addendum)
Drink plenty of fluids and to you are urinating very frequently. If the dizziness gets worse or other symptoms get worse please return.

## 2012-05-23 ENCOUNTER — Ambulatory Visit (INDEPENDENT_AMBULATORY_CARE_PROVIDER_SITE_OTHER): Payer: Medicare Other | Admitting: Family Medicine

## 2012-05-23 VITALS — BP 130/90 | HR 94 | Temp 98.2°F | Resp 16 | Ht 69.0 in | Wt 219.0 lb

## 2012-05-23 DIAGNOSIS — R51 Headache: Secondary | ICD-10-CM

## 2012-05-23 MED ORDER — KETOROLAC TROMETHAMINE 30 MG/ML IJ SOLN
30.0000 mg | Freq: Once | INTRAMUSCULAR | Status: AC
  Administered 2012-05-23: 30 mg via INTRAMUSCULAR

## 2012-05-23 NOTE — Progress Notes (Addendum)
JAXXEN KORTAN is a 72 y.o. male who presents to Urgent Care today with complaints of headaches:  1.  Headaches:  Present now for 2 days, basically since last seen at Urgent Care for nausea and vomiting.  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   Describes headache as Right sided head, no changes in vision.  Headache focused upon Right eye.  Some mild nausea still but no further vomiting.  No visual auras.  No gait instability or trouble with balance.  Does not have a strong history of headaches. No pulsatile tinnitus.  Has had some sinus congestion which started on Saturday, after leaving Urgent Care here.  Does have some rhinorrhea and ocular drainage from Right eye present today.  Took Tylenol three times today without relief of his headache.   Strong history of RA for which he takes Prednisone, which is tapering down, methotrexate and Plaquenil.  Notably, he states Rheumatologist at [facility] was working him up for PMR, but he now has new Rheumatologist (last 3 months) who changed diagnosis to RA and thus reason for Prednisone taper.  Down to 4 mg daily currently.  Still has many bottles of 1 and 5 mg at home.  Currently denies any shoulder or hip girdle weakness or pain.  Does endorse BL knee pain.  Denies arm pain or jaw claudication.  CBGs have dropped today to 65, which is low for him.  Usually his CBGs are 70 - 140 with occasional 200s.    PMH reviewed.  Past Medical History  Diagnosis Date  . Allergy   . Asthma   . Diabetes mellitus without complication    Past Surgical History  Procedure Laterality Date  . Appendectomy    . Hernia repair    . Prostate surgery      Medications reviewed. Current Outpatient Prescriptions  Medication Sig Dispense Refill  . amLODipine (NORVASC) 10 MG tablet Take 10 mg by mouth daily.      Marland Kitchen aspirin 81 MG tablet Take 81 mg by mouth daily.      . finasteride (PROSCAR) 5 MG tablet Take 5 mg by mouth daily.      .  fluticasone (VERAMYST) 27.5 MCG/SPRAY nasal spray Place 2 sprays into the nose 2 (two) times daily.      . folic acid (FOLVITE) 1 MG tablet Take 1 mg by mouth daily.      Marland Kitchen gabapentin (NEURONTIN) 300 MG capsule Take 1 capsule (300 mg total) by mouth 3 (three) times daily.  90 capsule  3  . glipiZIDE (GLUCOTROL) 10 MG tablet Take 10 mg by mouth 2 (two) times daily before a meal.      . hydrochlorothiazide (HYDRODIURIL) 25 MG tablet Take 25 mg by mouth daily.      Marland Kitchen HYDROcodone-acetaminophen (NORCO/VICODIN) 5-325 MG per tablet Take 1 tablet by mouth every 6 (six) hours as needed.      . hydroxychloroquine (PLAQUENIL) 200 MG tablet Take by mouth daily.      Marland Kitchen losartan (COZAAR) 100 MG tablet Take 100 mg by mouth daily.      . methotrexate (RHEUMATREX) 2.5 MG tablet Take 2.5 mg by mouth once a week. Caution:Chemotherapy. Protect from light.      . ondansetron (ZOFRAN ODT) 4 MG disintegrating tablet Take one every 6 hours as needed for nausea  20 tablet  0  . predniSONE (DELTASONE) 5 MG tablet Take 5 mg by mouth daily.      . Tamsulosin HCl (FLOMAX)  0.4 MG CAPS Take by mouth.      . doxycycline (VIBRA-TABS) 100 MG tablet Take 1 tablet (100 mg total) by mouth 2 (two) times daily.  20 tablet  0  . INSULIN GLARGINE [place] Inject 100 mg into the skin.      . montelukast (SINGULAIR) 10 MG tablet Take 10 mg by mouth at bedtime.       No current facility-administered medications for this visit.    ROS as above otherwise neg.  No chest pain, palpitations, SOB, Fever, Chills.   Physical Exam:  BP 130/90  Pulse 94  Temp(Src) 98.2 F (36.8 C) (Oral)  Resp 16  Ht 5\' 9"  (1.753 m)  Wt 219 lb (99.338 kg)  BMI 32.33 kg/m2  SpO2 98% Gen:  Patient sitting on exam table, appears stated age in no acute distress Head: Normocephalic atraumatic Eyes: EOMI, PERRL, sclera and conjunctiva non-erythematous. Fundoscopy with clear-cup-to disc margins and no paplledema BL.  Ears:  Canals clear bilaterally.  TMs pearly  gray bilaterally without erythema or bulging.   Nose:  Nasal turbinates grossly enlarged bilaterally. Some exudates noted. TTP along Right ethmoid and maxillary sinus.   Mouth: Mucosa membranes moist. Tonsils +2, nonenlarged, non-erythematous. Neck: No cervical lymphadenopathy noted Heart:  RRR, no murmurs auscultated. Pulm:  Clear to auscultation bilaterally with good air movement.  No wheezes or rales noted.   Neuro:  CN II - XII intact.  Strength and sensation 5/5 BL upper and lower extremities.  Sensation intact throughout.  DTRs +2 throughout.  Romberg negative, no pronator drift.  No difficulty ambulating, no trouble with balance.  Speech is coherent, linear and reflects coherent thought process.     Assessment and Plan:  1.  Headache: - Possibly cluster headache, however I am worried by his age and fact that prior Rheumatologist was concerned for PMR, prompting me to increase his Prednisone back to 40 mg burst to treat for possible temporal arteritis. - States he has plenty of Prednisone at home and no need for refill.  Declines methylprednisone shot here - Also with plenty of hydrocodone which he can take for relief of HA.  Has not tried this yet.  - Provided 30 mg of Toradol which improved his headache somewhat after 20 minutes, but pain still present. - Obtaining sed rate and CRP today to assess for inflammation. - No neuro deficits currently or by history. Less likely to be intracranial lesion. - Plan to call patient with results of sed rate and CRP and, if needed, referral urgently to vascular surgeon for temporal artery biopsy.  - If pain does not remit, low threshold for intracranial imaging.

## 2012-05-23 NOTE — Patient Instructions (Signed)
Take 40 mg (8 pills) of the prednisone tonight.    We will call you tomorrow with the lab test results.    If the pain is really bad, you can take the hydrocodone tonight for relief.

## 2012-05-25 ENCOUNTER — Telehealth: Payer: Self-pay | Admitting: Family Medicine

## 2012-05-25 LAB — COMPREHENSIVE METABOLIC PANEL
ALT: 34 U/L (ref 0–53)
AST: 25 U/L (ref 0–37)
Albumin: 4 g/dL (ref 3.5–5.2)
Alkaline Phosphatase: 101 U/L (ref 39–117)
BUN: 15 mg/dL (ref 6–23)
CO2: 33 mEq/L — ABNORMAL HIGH (ref 19–32)
Calcium: 9.3 mg/dL (ref 8.4–10.5)
Chloride: 101 mEq/L (ref 96–112)
Creat: 1.4 mg/dL — ABNORMAL HIGH (ref 0.50–1.35)
Glucose, Bld: 60 mg/dL — ABNORMAL LOW (ref 70–99)
Potassium: 3.8 mEq/L (ref 3.5–5.3)
Sodium: 141 mEq/L (ref 135–145)
Total Bilirubin: 1.2 mg/dL (ref 0.3–1.2)
Total Protein: 6.6 g/dL (ref 6.0–8.3)

## 2012-05-25 LAB — C-REACTIVE PROTEIN: CRP: 6.8 mg/dL — ABNORMAL HIGH (ref <0.60)

## 2012-05-25 LAB — SEDIMENTATION RATE: Sed Rate: 28 mm/hr — ABNORMAL HIGH (ref 0–16)

## 2012-05-25 NOTE — Telephone Encounter (Signed)
Called and spoke with patient on his mobile phone after leaving home message.  Headache resolved later that night, before he took prednisone.  He had some food to eat and this resolved headache and the blurry vision which he was having.  Still on the increased dosage of Prednisone.    I discussed with him that labs showed elevation consistent with inflammation and that I think he should get back in to the Rheumatologist sooner than next month (which is when his next appt is scheduled) as he had such a bad headache.  He is to continue the increased dosage of Prednisone, despite the fact that this improved before taking his prednisone.  Concerns are that previous rheumatologist was concerned that he had PMR and I was concerned for temporal arteritis.  Patient will call Rheum to attempt to reschedule.

## 2012-06-11 ENCOUNTER — Encounter: Payer: Self-pay | Admitting: Emergency Medicine

## 2012-06-11 ENCOUNTER — Ambulatory Visit (INDEPENDENT_AMBULATORY_CARE_PROVIDER_SITE_OTHER): Payer: Medicare Other | Admitting: Emergency Medicine

## 2012-06-11 VITALS — BP 135/68 | HR 81 | Temp 98.5°F | Resp 18 | Wt 223.0 lb

## 2012-06-11 DIAGNOSIS — L0293 Carbuncle, unspecified: Secondary | ICD-10-CM

## 2012-06-11 DIAGNOSIS — L989 Disorder of the skin and subcutaneous tissue, unspecified: Secondary | ICD-10-CM

## 2012-06-11 MED ORDER — MUPIROCIN 2 % EX OINT: TOPICAL_OINTMENT | Freq: Three times a day (TID) | CUTANEOUS | Status: DC

## 2012-06-11 MED ORDER — DOXYCYCLINE HYCLATE 100 MG PO CAPS: 100.0000 mg | ORAL_CAPSULE | Freq: Two times a day (BID) | ORAL | Status: DC

## 2012-06-11 NOTE — Progress Notes (Signed)
  Subjective:    Patient ID: Jacob [name], male    DOB: 1940/10/24, 72 y.o.   MRN: [name]  HPI 72 year old male presents with: lesions on neck and nose. Has past history of lesions but these appeared 2 months ago. Had been using neosporin over the lesions and has not resolved. The [facility] lanced the lesions and suspected MRSA, but did not confirm with testing. He has been dealing with headaches and nausea recently- (followed by rheumatologist at [facility]) but the headaches have ceased.    Review of Systems     Objective:   Physical Exam examination of the face reveals 3 boils on the face. There are 2 on the left side of the face. There is one over the lateral malar area and one at the angle of the jaw. On the right side at gain of the jaw is a 1 x 2 cm abscess area. Patient is alert and cooperative and not ill-appearing        Assessment & Plan:  These appear to be boils probably MRSA. He has a history of temporal arteritis and is currently on chronic prednisone therapy

## 2012-06-11 NOTE — Patient Instructions (Addendum)
Staphylococcal Infections  Staphylococcus aureus (Staph) is a germ that may cause infections, especially on broken skin or wounds. Methicillin is a drug sometimes used to treat Staph infections. If the germ is resistant to methicillin, it is called MRSA. Methicillin and some other drugs may not work to treat the infection. However, there are other antibiotic drugs that may be used that will treat the infection. Your caregiver may do a culture from your wound, skin, or other site. This will tell him/her that you have MRSA present. Sometimes healthy people carry MRSA, but it may also cause an infection.  Staph infections, including MRSA can spread from one person to another by contact with an infected person. You may prevent spreading an MRSA infection to those you live with or others around you by following these steps:  Keep infections and pus or drainage material covered with clean dry bandages. Follow your caregiver's instructions on proper care of the wound. Pus from infected wounds can contain MRSA and spread the germ (bacteria) to others.  Advise your family and other close contacts to wash their hands frequently with soap and warm water. This should be done especially if they change your bandages or touch the infected wound or infectious materials.  Avoid sharing personal items (towels, washcloth, razor, clothing, or uniforms) that may have had contact with the infected wound.  Wash linens and clothes that become soiled, with hot water and laundry detergent. Drying clothes in a hot dryer, rather than air-drying, also helps kill bacteria in clothes.  Tell any healthcare providers who treat you that you have an MRSA infection. In the hospital steps will be taken to prevent the spread of MRSA.  Ask your caregiver about return to school or return to work if you have a Staph or MRSA infection.  If your caregiver has given you a follow-up appointment, it is very important to keep that appointment.  Not keeping the appointment could result in a chronic or permanent injury, pain, and disability. If there is any problem keeping the appointment, you must call back to this facility for assistance. Staph and MRSA infections can become very serious and you should contact your caregiver if your infection gets worse. To fight the infection, follow your doctor's instructions for wound care and take all medicines as prescribed. SEEK MEDICAL CARE IF:   You have increased pus coming from the wound.  You have a fever.  You notice a bad smell coming from the wound or dressing. SEEK IMMEDIATE MEDICAL CARE IF:  You have redness, red streaks, swelling, or increasing pain in the wound. Document Released: 05/23/2002 Document Revised: 05/25/2011 Document Reviewed: 10/17/2007 [facility] Patient Information 2013 [place].

## 2012-06-13 LAB — WOUND CULTURE: Gram Stain: NONE SEEN

## 2012-08-02 ENCOUNTER — Ambulatory Visit: Payer: Medicare Other

## 2012-08-02 ENCOUNTER — Ambulatory Visit (INDEPENDENT_AMBULATORY_CARE_PROVIDER_SITE_OTHER): Payer: Medicare Other | Admitting: Family Medicine

## 2012-08-02 VITALS — BP 160/76 | HR 90 | Temp 98.1°F | Resp 18 | Ht 69.0 in | Wt 220.2 lb

## 2012-08-02 DIAGNOSIS — J441 Chronic obstructive pulmonary disease with (acute) exacerbation: Secondary | ICD-10-CM

## 2012-08-02 MED ORDER — ALBUTEROL SULFATE (2.5 MG/3ML) 0.083% IN NEBU
2.5000 mg | INHALATION_SOLUTION | Freq: Once | RESPIRATORY_TRACT | Status: AC
  Administered 2012-08-02: 2.5 mg via RESPIRATORY_TRACT

## 2012-08-02 MED ORDER — GUAIFENESIN-CODEINE 100-10 MG/5ML PO SYRP: 5.0000 mL | ORAL_SOLUTION | Freq: Three times a day (TID) | ORAL | Status: DC | PRN

## 2012-08-02 MED ORDER — IPRATROPIUM BROMIDE 0.02 % IN SOLN
0.5000 mg | Freq: Once | RESPIRATORY_TRACT | Status: AC
  Administered 2012-08-02: 0.5 mg via RESPIRATORY_TRACT

## 2012-08-02 NOTE — Patient Instructions (Addendum)
Thank you for coming in today. Take 50mg  of prednisone daily for 5 days.  Use the nebulizer every 6 hours for several days and then as needed.  Please come back Monday for follow up.  Call or go to the emergency room if you get worse, have trouble breathing, have chest pains, or palpitations.

## 2012-08-02 NOTE — Progress Notes (Addendum)
Jacob [name] is a 72 y.o. male who presents to [facility] today for cough, wheezing, congestion, sore throat and runny nose for 1.5 weeks. Patient notes he cough productive to clear sputum. He's been using his albuterol nebulizer which has helped a bit. He denies any chest pains palpitations shortness of breath or syncope. This is consistent with prior episodes of COPD exacerbation. He denies any leg swelling fevers or chills additionally.     PMH: Reviewed COPD, CAD, diabetes, polymyalgia rheumatica well controlled on 8 mg of prednisone daily History  Substance Use Topics  . Smoking status: Former Research scientist (life sciences)  . Smokeless tobacco: Not on file  . Alcohol Use: No   ROS as above  Medications reviewed. Current Outpatient Prescriptions  Medication Sig Dispense Refill  . amLODipine (NORVASC) 10 MG tablet Take 10 mg by mouth daily.      . finasteride (PROSCAR) 5 MG tablet Take 5 mg by mouth daily.      . folic acid (FOLVITE) 1 MG tablet Take 1 mg by mouth daily.      Marland Kitchen gabapentin (NEURONTIN) 300 MG capsule Take 1 capsule (300 mg total) by mouth 3 (three) times daily.  90 capsule  3  . glipiZIDE (GLUCOTROL) 10 MG tablet Take 10 mg by mouth 2 (two) times daily before a meal.      . hydrochlorothiazide (HYDRODIURIL) 25 MG tablet Take 25 mg by mouth daily.      Marland Kitchen HYDROcodone-acetaminophen (NORCO/VICODIN) 5-325 MG per tablet Take 1 tablet by mouth every 6 (six) hours as needed.      . hydroxychloroquine (PLAQUENIL) 200 MG tablet Take by mouth daily.      . INSULIN GLARGINE [place] Inject 100 mg into the skin.      Marland Kitchen losartan (COZAAR) 100 MG tablet Take 100 mg by mouth daily.      . methotrexate (RHEUMATREX) 2.5 MG tablet Take 2.5 mg by mouth once a week. Caution:Chemotherapy. Protect from light.      . predniSONE (DELTASONE) 5 MG tablet Take 5 mg by mouth daily.      . Tamsulosin HCl (FLOMAX) 0.4 MG CAPS Take by mouth.      Marland Kitchen aspirin 81 MG tablet Take 81 mg by mouth daily.      Marland Kitchen doxycycline (VIBRAMYCIN) 100 MG  capsule Take 1 capsule (100 mg total) by mouth 2 (two) times daily.  20 capsule  0  . fluticasone (VERAMYST) 27.5 MCG/SPRAY nasal spray Place 2 sprays into the nose 2 (two) times daily.      . mupirocin ointment (BACTROBAN) 2 % Apply topically 3 (three) times daily.  22 g  0  . ondansetron (ZOFRAN ODT) 4 MG disintegrating tablet Take one every 6 hours as needed for nausea  20 tablet  0   No current facility-administered medications for this visit.    Exam:  BP 160/76  Pulse 90  Temp(Src) 98.1 F (36.7 C) (Oral)  Resp 18  Ht 5\' 9"  (1.753 m)  Wt 220 lb 3.2 oz (99.882 kg)  BMI 32.5 kg/m2  SpO2 97% Gen: Well NAD HEENT: EOMI,  MMM Lungs: Normal work of breathing. Diffuse expiratory wheezing throughout with no rales. Rhonchorous breath sounds present.  Heart: RRR no MRG Abd: NABS, NT, ND Exts: Non edematous BL  LE, warm and well perfused.   Patient had no subjective improvement in wheezing with albuterol/Atrovent treatment.   Two-view chest x-ray preliminary results. Increased lung markings with hardware from CABG. No acute infiltrates noted.  Twelve-lead  EKG: Normal sinus rhythm at 94 beats per minute. No significant change from previous EKG. No ST segment elevations.  Assessment and Plan: 72 y.o. male with COPD exacerbation.  Will increase prednisone to 50 mg daily for 5 days. Will continue to use at home albuterol nebulizer. Will use codeine containing cough medicine for cough suppressant. We'll followup in 6 days or sooner if needed. Discussed warning signs or symptoms. Please see discharge instructions. Patient expresses understanding.  I have reviewed and agree with documentation. Robert P. Laney Pastor, M.D.

## 2012-08-08 ENCOUNTER — Ambulatory Visit (INDEPENDENT_AMBULATORY_CARE_PROVIDER_SITE_OTHER): Payer: Medicare Other | Admitting: Family Medicine

## 2012-08-08 ENCOUNTER — Ambulatory Visit: Payer: Medicare Other

## 2012-08-08 VITALS — BP 110/68 | HR 86 | Temp 98.5°F | Resp 17 | Ht 69.5 in | Wt 220.0 lb

## 2012-08-08 DIAGNOSIS — E119 Type 2 diabetes mellitus without complications: Secondary | ICD-10-CM

## 2012-08-08 DIAGNOSIS — J441 Chronic obstructive pulmonary disease with (acute) exacerbation: Secondary | ICD-10-CM

## 2012-08-08 DIAGNOSIS — J209 Acute bronchitis, unspecified: Secondary | ICD-10-CM

## 2012-08-08 LAB — POCT CBC
Granulocyte percent: 78.9 %G (ref 37–80)
HCT, POC: 39.7 % — AB (ref 43.5–53.7)
Hemoglobin: 12.4 g/dL — AB (ref 14.1–18.1)
Lymph, poc: 1.5 (ref 0.6–3.4)
MCH, POC: 30.1 pg (ref 27–31.2)
MCHC: 31.2 g/dL — AB (ref 31.8–35.4)
MCV: 96.4 fL (ref 80–97)
MID (cbc): 0.7 (ref 0–0.9)
MPV: 8 fL (ref 0–99.8)
POC Granulocyte: 8 — AB (ref 2–6.9)
POC LYMPH PERCENT: 14.5 %L (ref 10–50)
POC MID %: 6.6 %M (ref 0–12)
Platelet Count, POC: 294 10*3/uL (ref 142–424)
RBC: 4.12 M/uL — AB (ref 4.69–6.13)
RDW, POC: 15.6 %
WBC: 10.2 10*3/uL (ref 4.6–10.2)

## 2012-08-08 LAB — GLUCOSE, POCT (MANUAL RESULT ENTRY): POC Glucose: 178 mg/dl — AB (ref 70–99)

## 2012-08-08 MED ORDER — IPRATROPIUM BROMIDE 0.02 % IN SOLN
0.5000 mg | Freq: Once | RESPIRATORY_TRACT | Status: AC
  Administered 2012-08-08: 0.5 mg via RESPIRATORY_TRACT

## 2012-08-08 MED ORDER — GUAIFENESIN-CODEINE 100-10 MG/5ML PO SYRP: 5.0000 mL | ORAL_SOLUTION | Freq: Four times a day (QID) | ORAL | Status: DC | PRN

## 2012-08-08 MED ORDER — IPRATROPIUM BROMIDE 0.02 % IN SOLN: 500.0000 ug | Freq: Four times a day (QID) | RESPIRATORY_TRACT | Status: DC

## 2012-08-08 MED ORDER — ALBUTEROL SULFATE (2.5 MG/3ML) 0.083% IN NEBU
2.5000 mg | INHALATION_SOLUTION | Freq: Once | RESPIRATORY_TRACT | Status: AC
  Administered 2012-08-08: 2.5 mg via RESPIRATORY_TRACT

## 2012-08-08 MED ORDER — AZITHROMYCIN 250 MG PO TABS: ORAL_TABLET | ORAL | Status: DC

## 2012-08-08 NOTE — Progress Notes (Signed)
[place], [place]  [address]   [phone]  Subjective:    Patient ID: Jacob [name], male    DOB: [birth year], 72 y.o.   MRN: [id number]  HPI This 72 y.o. male presents for evaluation of SOB.  Evaluated on 08/02/12 by Dr. Dorena Bodo; diagnosed with COPD exacerbation; s/p CXR negative; EKG without acute changes; prescribed Prednisone 50mg  daily x 5 days.  Presenting for recheck.  No better from last week.  Got worse last night.  Coughed all night long.  No fever/chills/sweats.  Mild headache.  +rhinorrhea; +nasal congestion; clear with mild yellow.  Last week, sputum clear; now sputum yellow and thick.  +sore throat; no ear pain; +ear congestion.  +wheezing a lot unchanged from last week; nebulizer four times per day.  Nebulizer only has Albuterol in it at home.  Scheduled for dental surgery tomorrow.  Still SOB; not able to walk around complex; able to walk around apartment without difficulty.  No v/d.  Last treatment at home last night.   Previously maintained on three daily inhalers but continued to worsen; all inhalers stopped one year ago and switched to nebulizer with much improvement.     Review of Systems  Constitutional: Negative for fever, chills, diaphoresis and fatigue.  HENT: Positive for congestion, sore throat, rhinorrhea, voice change and postnasal drip. Negative for ear pain, trouble swallowing and sinus pressure.   Respiratory: Positive for cough, shortness of breath and wheezing.   Cardiovascular: Negative for chest pain, palpitations and leg swelling.  Gastrointestinal: Negative for nausea, vomiting and diarrhea.    Past Medical History  Diagnosis Date  . Allergy   . Asthma   . Diabetes mellitus without complication   . Cataract   . Arthritis     Past Surgical History  Procedure Laterality Date  . Appendectomy    . Hernia repair    . Prostate surgery    . Coronary artery bypass graft      Prior to Admission medications   Medication Sig Start Date  End Date Taking? Authorizing Provider  amLODipine (NORVASC) 10 MG tablet Take 10 mg by mouth daily.   Yes Historical Provider, MD  finasteride (PROSCAR) 5 MG tablet Take 5 mg by mouth daily.   Yes Historical Provider, MD  fluticasone (VERAMYST) 27.5 MCG/SPRAY nasal spray Place 2 sprays into the nose 2 (two) times daily.   Yes Historical Provider, MD  folic acid (FOLVITE) 1 MG tablet Take 1 mg by mouth daily.   Yes Historical Provider, MD  gabapentin (NEURONTIN) 300 MG capsule Take 1 capsule (300 mg total) by mouth 3 (three) times daily. 05/21/12  Yes Posey Boyer, MD  glipiZIDE (GLUCOTROL) 10 MG tablet Take 10 mg by mouth 2 (two) times daily before a meal.   Yes Historical Provider, MD  guaiFENesin-codeine (ROBITUSSIN AC) 100-10 MG/5ML syrup Take 5 mLs by mouth 3 (three) times daily as needed for cough. (sugar free) 08/02/12  Yes Gregor Hams, MD  hydrochlorothiazide (HYDRODIURIL) 25 MG tablet Take 25 mg by mouth daily.   Yes Historical Provider, MD  HYDROcodone-acetaminophen (NORCO/VICODIN) 5-325 MG per tablet Take 1 tablet by mouth every 6 (six) hours as needed.   Yes Historical Provider, MD  hydroxychloroquine (PLAQUENIL) 200 MG tablet Take by mouth daily.   Yes Historical Provider, MD  INSULIN GLARGINE [place] Inject 100 mg into the skin.   Yes Historical Provider, MD  losartan (COZAAR) 100 MG tablet Take 100 mg by mouth daily.  Yes Historical Provider, MD  methotrexate (RHEUMATREX) 2.5 MG tablet Take 2.5 mg by mouth once a week. Caution:Chemotherapy. Protect from light.   Yes Historical Provider, MD  mupirocin ointment (BACTROBAN) 2 % Apply topically 3 (three) times daily. 06/11/12  Yes Darlyne Russian, MD  ondansetron (ZOFRAN ODT) 4 MG disintegrating tablet Take one every 6 hours as needed for nausea 05/21/12  Yes Posey Boyer, MD  predniSONE (DELTASONE) 5 MG tablet Take 5 mg by mouth daily.   Yes Historical Provider, MD  Tamsulosin HCl (FLOMAX) 0.4 MG CAPS Take by mouth.   Yes Historical Provider, MD   aspirin 81 MG tablet Take 81 mg by mouth daily.    Historical Provider, MD    Allergies  Allergen Reactions  . Ace Inhibitors   . Amoxicillin   . Augmentin (Amoxicillin-Pot Clavulanate)   . Ciprocinonide (Fluocinolone)   . Flunisolide   . Metformin And Related   . Sertraline   . Sulindac   . Terazosin     History   Social History  . Marital Status: Married    Spouse Name: N/A    Number of Children: N/A  . Years of Education: N/A   Occupational History  . Not on file.   Social History Main Topics  . Smoking status: Former Research scientist (life sciences)  . Smokeless tobacco: Not on file  . Alcohol Use: No  . Drug Use: No  . Sexually Active: Yes    Birth Control/ Protection: None   Other Topics Concern  . Not on file   Social History Narrative  . No narrative on file    History reviewed. No pertinent family history.     Objective:   Physical Exam  Nursing note reviewed. Constitutional: He is oriented to person, place, and time. He appears well-developed and well-nourished. No distress.  HENT:  Head: Normocephalic and atraumatic.  Right Ear: External ear normal.  Left Ear: External ear normal.  Nose: Nose normal.  Mouth/Throat: Oropharynx is clear and moist.  Eyes: Conjunctivae and EOM are normal. Pupils are equal, round, and reactive to light.  Neck: Normal range of motion. Neck supple.  Cardiovascular: Normal rate, regular rhythm and normal heart sounds.  Exam reveals no gallop and no friction rub.   No murmur heard. No LE edema.  Pulmonary/Chest: He has no wheezes. He has rhonchi. He has no rales.  No tachypnea; no accessory muscle use.  Speaking complete sentences.  Good air movement.  Lymphadenopathy:    He has no cervical adenopathy.  Neurological: He is alert and oriented to person, place, and time.  Skin: He is not diaphoretic.  Psychiatric: He has a normal mood and affect. His behavior is normal.    Results for orders placed in visit on 08/08/12  POCT CBC       Result Value Range   WBC 10.2  4.6 - 10.2 K/uL   Lymph, poc 1.5  0.6 - 3.4   POC LYMPH PERCENT 14.5  10 - 50 %L   MID (cbc) 0.7  0 - 0.9   POC MID % 6.6  0 - 12 %M   POC Granulocyte 8.0 (*) 2 - 6.9   Granulocyte percent 78.9  37 - 80 %G   RBC 4.12 (*) 4.69 - 6.13 M/uL   Hemoglobin 12.4 (*) 14.1 - 18.1 g/dL   HCT, POC 39.7 (*) 43.5 - 53.7 %   MCV 96.4  80 - 97 fL   MCH, POC 30.1  27 - 31.2 pg  MCHC 31.2 (*) 31.8 - 35.4 g/dL   RDW, POC 15.6     Platelet Count, POC 294  142 - 424 K/uL   MPV 8.0  0 - 99.8 fL  GLUCOSE, POCT (MANUAL RESULT ENTRY)      Result Value Range   POC Glucose 178 (*) 70 - 99 mg/dl   [facility] reading (PRIMARY) by  Dr. Tamala Julian.  CXR: NAD; sternotomy wires present.   ALBUTEROL/ATROVENT NEBULIZER ADMINISTERED IN OFFICE.  POST-NEBULIZER PULSE OXIMETRY OF 98%. IMPROVED AIR FLOW AFTER NEBULIZER WITH DECREASED RHONCHI.     Assessment & Plan:  COPD exacerbation - Plan: POCT CBC, POCT glucose (manual entry), DG Chest 2 View, albuterol (PROVENTIL) (2.5 MG/3ML) 0.083% nebulizer solution 2.5 mg, ipratropium (ATROVENT) nebulizer solution 0.5 mg  Acute bronchitis - Plan: POCT CBC, POCT glucose (manual entry), DG Chest 2 View, albuterol (PROVENTIL) (2.5 MG/3ML) 0.083% nebulizer solution 2.5 mg, ipratropium (ATROVENT) nebulizer solution 0.5 mg  Diabetes - Plan: POCT glucose (manual entry)  1. COPD exacerbation: persistent/unchanged from last visit.  Increase Prednisone to 50mg  daily x 5 days ago.  Rx for Atrovent nebulizer to add to Albuterol nebulizer and advised to continue qid.  Advised to delay dental procedure for next two weeks.  RTC if no improvement in one week and sooner if acutely worsens. 2. Acute Bronchitis: New.  Rx for Zithromax 500mg  daily x 5days.  Rx for Robitussin [facility] refill provided.  Cannot tolerate Mucinex DM due to GI side effects. 3. DMII: worsening with higher dose of Prednisone; advised to monitor sugars closely.   Meds ordered this encounter  Medications    . albuterol (PROVENTIL) (2.5 MG/3ML) 0.083% nebulizer solution 2.5 mg    Sig:   . ipratropium (ATROVENT) nebulizer solution 0.5 mg    Sig:   . azithromycin (ZITHROMAX) 250 MG tablet    Sig: Two tablets daily x 5 days    Dispense:  10 tablet    Refill:  0  . ipratropium (ATROVENT) 0.02 % nebulizer solution    Sig: Take 2.5 mLs (500 mcg total) by nebulization 4 (four) times daily.    Dispense:  75 mL    Refill:  12  . guaiFENesin-codeine (ROBITUSSIN AC) 100-10 MG/5ML syrup    Sig: Take 5 mLs by mouth 4 (four) times daily as needed for cough. (sugar free)    Dispense:  240 mL    Refill:  0

## 2012-08-08 NOTE — Patient Instructions (Addendum)
1.  PLEASE RESCHEDULE DENTAL SURGERY FOR TWO WEEKS FROM NOW.   2. RETURN IF NO IMPROVEMENT IN 7-10 DAYS.

## 2012-08-17 ENCOUNTER — Ambulatory Visit (INDEPENDENT_AMBULATORY_CARE_PROVIDER_SITE_OTHER): Payer: Medicare Other | Admitting: Family Medicine

## 2012-08-17 VITALS — BP 138/72 | HR 84 | Temp 98.1°F | Resp 18 | Ht 70.0 in | Wt 219.0 lb

## 2012-08-17 DIAGNOSIS — J309 Allergic rhinitis, unspecified: Secondary | ICD-10-CM

## 2012-08-17 DIAGNOSIS — J209 Acute bronchitis, unspecified: Secondary | ICD-10-CM

## 2012-08-17 DIAGNOSIS — J441 Chronic obstructive pulmonary disease with (acute) exacerbation: Secondary | ICD-10-CM

## 2012-08-17 DIAGNOSIS — E119 Type 2 diabetes mellitus without complications: Secondary | ICD-10-CM

## 2012-08-17 LAB — POCT CBC
Granulocyte percent: 77.3 %G (ref 37–80)
HCT, POC: 41.7 % — AB (ref 43.5–53.7)
Hemoglobin: 12.9 g/dL — AB (ref 14.1–18.1)
Lymph, poc: 1.9 (ref 0.6–3.4)
MCH, POC: 30.2 pg (ref 27–31.2)
MCHC: 30.9 g/dL — AB (ref 31.8–35.4)
MCV: 97.7 fL — AB (ref 80–97)
MID (cbc): 0.7 (ref 0–0.9)
MPV: 7.7 fL (ref 0–99.8)
POC Granulocyte: 9 — AB (ref 2–6.9)
POC LYMPH PERCENT: 16.8 %L (ref 10–50)
POC MID %: 5.9 %M (ref 0–12)
Platelet Count, POC: 238 10*3/uL (ref 142–424)
RBC: 4.27 M/uL — AB (ref 4.69–6.13)
RDW, POC: 17.4 %
WBC: 11.6 10*3/uL — AB (ref 4.6–10.2)

## 2012-08-17 LAB — GLUCOSE, POCT (MANUAL RESULT ENTRY): POC Glucose: 98 mg/dl (ref 70–99)

## 2012-08-17 MED ORDER — PREDNISONE 5 MG PO TABS: 5.0000 mg | ORAL_TABLET | Freq: Every day | ORAL | Status: DC

## 2012-08-17 MED ORDER — IPRATROPIUM BROMIDE 0.03 % NA SOLN: 2.0000 | Freq: Two times a day (BID) | NASAL | Status: DC

## 2012-08-17 MED ORDER — LEVOFLOXACIN 750 MG PO TABS: 750.0000 mg | ORAL_TABLET | Freq: Every day | ORAL | Status: DC

## 2012-08-17 NOTE — Progress Notes (Signed)
[place], [place]  [address]   [phone]  Subjective:    Patient ID: Jacob [name], male    DOB: [birth year], 72 y.o.   MRN: [name]  HPI This 72 y.o. male presents for evaluation of persistent COPD exacerbation.  Prescribed Zithromax, Prednisone 50mg  x 5 days; completed five days ago.  Doing nebulizer four times per day.  No fever/chills/sweats.  Mild headache.  Scratchy throat.  +rhinorrhea/nasal congestion clear.  +wheezing a lot yesterday; +coughing more; slight yellow mucous production large amounts.   Still waking up choking and could not breath.  +PND chronic.   S/p allergy testing but maintained on Prednisone at time of allergy testing.  Using Afrin qhs for past several weeks with improvement of chronic nasal congestion.  No antihistamines currently.  Taking Singulair daily with improvement in some symptoms.  Scheduled for dental procedure on Monday in five days.  No chest pain; +leg swelling; started diuretic a few days ago with improvement.    Sugars increased to 250 with increased Prednisone dose but now improved.   Review of Systems  Constitutional: Negative for fever, chills, diaphoresis and fatigue.  HENT: Positive for congestion, sore throat, rhinorrhea, sneezing, voice change and postnasal drip. Negative for ear pain, trouble swallowing and sinus pressure.   Respiratory: Positive for cough, shortness of breath and wheezing. Negative for stridor.   Cardiovascular: Positive for leg swelling. Negative for chest pain and palpitations.  Endocrine: Negative for cold intolerance, heat intolerance, polydipsia, polyphagia and polyuria.  Neurological: Negative for headaches.   Past Medical History  Diagnosis Date  . Allergy   . Asthma   . Diabetes mellitus without complication   . Cataract   . Arthritis   . Polymyalgia rheumatica     maintained on Prednisone, Plaquenil.  Marland Kitchen COPD (chronic obstructive pulmonary disease)    Current Outpatient Prescriptions on File  Prior to Visit  Medication Sig Dispense Refill  . amLODipine (NORVASC) 10 MG tablet Take 10 mg by mouth daily.      Marland Kitchen aspirin 81 MG tablet Take 81 mg by mouth daily.      Marland Kitchen azithromycin (ZITHROMAX) 250 MG tablet Two tablets daily x 5 days  10 tablet  0  . fluticasone (VERAMYST) 27.5 MCG/SPRAY nasal spray Place 2 sprays into the nose 2 (two) times daily.      . folic acid (FOLVITE) 1 MG tablet Take 1 mg by mouth daily.      Marland Kitchen gabapentin (NEURONTIN) 300 MG capsule Take 1 capsule (300 mg total) by mouth 3 (three) times daily.  90 capsule  3  . glipiZIDE (GLUCOTROL) 10 MG tablet Take 10 mg by mouth 2 (two) times daily before a meal.      . guaiFENesin-codeine (ROBITUSSIN AC) 100-10 MG/5ML syrup Take 5 mLs by mouth 4 (four) times daily as needed for cough. (sugar free)  240 mL  0  . hydrochlorothiazide (HYDRODIURIL) 25 MG tablet Take 25 mg by mouth daily.      Marland Kitchen HYDROcodone-acetaminophen (NORCO/VICODIN) 5-325 MG per tablet Take 1 tablet by mouth every 6 (six) hours as needed.      . hydroxychloroquine (PLAQUENIL) 200 MG tablet Take by mouth daily.      . INSULIN GLARGINE [place] Inject 100 mg into the skin.      Marland Kitchen ipratropium (ATROVENT) 0.02 % nebulizer solution Take 2.5 mLs (500 mcg total) by nebulization 4 (four) times daily.  75 mL  12  . losartan (COZAAR) 100  MG tablet Take 100 mg by mouth daily.      . methotrexate (RHEUMATREX) 2.5 MG tablet Take 2.5 mg by mouth once a week. Caution:Chemotherapy. Protect from light.      . mupirocin ointment (BACTROBAN) 2 % Apply topically 3 (three) times daily.  22 g  0  . Tamsulosin HCl (FLOMAX) 0.4 MG CAPS Take by mouth.      . finasteride (PROSCAR) 5 MG tablet Take 5 mg by mouth daily.      . ondansetron (ZOFRAN ODT) 4 MG disintegrating tablet Take one every 6 hours as needed for nausea  20 tablet  0   No current facility-administered medications on file prior to visit.       Objective:   Physical Exam  Nursing note and vitals reviewed. Constitutional: He  is oriented to person, place, and time. He appears well-developed and well-nourished.  HENT:  Head: Normocephalic and atraumatic.  Right Ear: External ear normal.  Left Ear: External ear normal.  Nose: Nose normal.  Mouth/Throat: Oropharynx is clear and moist. No oropharyngeal exudate.  Eyes: Conjunctivae are normal. Pupils are equal, round, and reactive to light.  Neck: Normal range of motion. Neck supple.  Cardiovascular: Normal rate, regular rhythm and normal heart sounds.   No murmur heard. Trace pitting edema B.  Frequent PVCs  Pulmonary/Chest: Effort normal and breath sounds normal. No respiratory distress. He has no decreased breath sounds. He has no wheezes. He has no rhonchi. He has no rales.  Harsh barking cough with congestion.  Lymphadenopathy:    He has no cervical adenopathy.  Neurological: He is alert and oriented to person, place, and time.  Skin: He is not diaphoretic.  Psychiatric: He has a normal mood and affect. His behavior is normal.    Results for orders placed in visit on 08/17/12  POCT CBC      Result Value Range   WBC 11.6 (*) 4.6 - 10.2 K/uL   Lymph, poc 1.9  0.6 - 3.4   POC LYMPH PERCENT 16.8  10 - 50 %L   MID (cbc) 0.7  0 - 0.9   POC MID % 5.9  0 - 12 %M   POC Granulocyte 9.0 (*) 2 - 6.9   Granulocyte percent 77.3  37 - 80 %G   RBC 4.27 (*) 4.69 - 6.13 M/uL   Hemoglobin 12.9 (*) 14.1 - 18.1 g/dL   HCT, POC 41.7 (*) 43.5 - 53.7 %   MCV 97.7 (*) 80 - 97 fL   MCH, POC 30.2  27 - 31.2 pg   MCHC 30.9 (*) 31.8 - 35.4 g/dL   RDW, POC 17.4     Platelet Count, POC 238  142 - 424 K/uL   MPV 7.7  0 - 99.8 fL  GLUCOSE, POCT (MANUAL RESULT ENTRY)      Result Value Range   POC Glucose 98  70 - 99 mg/dl       Assessment & Plan:  COPD exacerbation - Plan: predniSONE (DELTASONE) 5 MG tablet, POCT CBC  Acute bronchitis - Plan: levofloxacin (LEVAQUIN) 750 MG tablet  Allergic rhinitis - Plan: ipratropium (ATROVENT) 0.03 % nasal spray  Type II or  unspecified type diabetes mellitus without mention of complication, not stated as uncontrolled - Plan: POCT glucose (manual entry)   1.  COPD Exacerbation: recurrent; increase Prednisone to 50mg  daily x 5 days and then 30 mg daily x 5 days and then decrease to 10mg  daily. 2.  Acute Bronchitis:  Recurrent/persistent;  s/p Zithromax; rx for Levaquin provided. 3.  Allergic Rhinitis: worsening; continue Singulair; rx for Atrovent nasal spray. 4. DMII: stable; monitor sugars closely with increased Prednisone dose. 5. PMR:  Stable; chronic Prednisone thus increased risk of infection.  Meds ordered this encounter  Medications  . predniSONE (DELTASONE) 5 MG tablet    Sig: Take 1-2 tablets (5-10 mg total) by mouth daily.    Dispense:  60 tablet    Refill:  3  . levofloxacin (LEVAQUIN) 750 MG tablet    Sig: Take 1 tablet (750 mg total) by mouth daily.    Dispense:  7 tablet    Refill:  0  . ipratropium (ATROVENT) 0.03 % nasal spray    Sig: Place 2 sprays into the nose 2 (two) times daily.    Dispense:  30 mL    Refill:  5

## 2012-08-17 NOTE — Patient Instructions (Addendum)
1.  INCREASE PREDNISONE TO 50MG  DAILY FOR FIVE DAYS THEN 30MG  DAILY FOR FIVE DAYS THEN DECREASE TO 10MG  DAILY.

## 2012-09-25 ENCOUNTER — Other Ambulatory Visit: Payer: Self-pay | Admitting: Family Medicine

## 2012-09-26 ENCOUNTER — Telehealth: Payer: Self-pay

## 2012-10-06 ENCOUNTER — Ambulatory Visit (INDEPENDENT_AMBULATORY_CARE_PROVIDER_SITE_OTHER): Payer: Medicare Other | Admitting: Family Medicine

## 2012-10-06 VITALS — BP 134/62 | HR 104 | Temp 98.0°F | Resp 17 | Ht 69.0 in | Wt 214.0 lb

## 2012-10-06 DIAGNOSIS — I4949 Other premature depolarization: Secondary | ICD-10-CM

## 2012-10-06 DIAGNOSIS — H109 Unspecified conjunctivitis: Secondary | ICD-10-CM

## 2012-10-06 DIAGNOSIS — R000 Tachycardia, unspecified: Secondary | ICD-10-CM

## 2012-10-06 DIAGNOSIS — I493 Ventricular premature depolarization: Secondary | ICD-10-CM

## 2012-10-06 DIAGNOSIS — J0100 Acute maxillary sinusitis, unspecified: Secondary | ICD-10-CM

## 2012-10-06 MED ORDER — LEVOFLOXACIN 750 MG PO TABS: 750.0000 mg | ORAL_TABLET | Freq: Every day | ORAL | Status: DC

## 2012-10-06 MED ORDER — GUAIFENESIN-CODEINE 100-10 MG/5ML PO SYRP
5.0000 mL | ORAL_SOLUTION | Freq: Three times a day (TID) | ORAL | Status: DC | PRN
Start: 2012-10-06 — End: 2013-03-05

## 2012-10-06 MED ORDER — ERYTHROMYCIN 5 MG/GM OP OINT: TOPICAL_OINTMENT | Freq: Every day | OPHTHALMIC | Status: DC

## 2012-10-06 MED ORDER — GUAIFENESIN-CODEINE 100-10 MG/5ML PO SYRP: 5.0000 mL | ORAL_SOLUTION | Freq: Three times a day (TID) | ORAL | Status: DC | PRN

## 2012-10-06 NOTE — Patient Instructions (Addendum)

## 2012-10-06 NOTE — Progress Notes (Signed)
[place], [place]  [address]   [phone]  Subjective:    Patient ID: Jacob [name], male    DOB: [birth year], 72 y.o.   MRN: [name]  HPI This 72 y.o. male presents for evaluation of sinus congestion.  Onset of congestion two weeks ago and worsening.  Using eye wash and using allergy drops.  No fever/chills/sweats.  No headaches; +sinus pressure facial.  Horrible nasal congestion.  Using netti pot wash 2-3 times per day; at nighttime, congestion is horrible.  Drainage bloody thick.  +PND.  +coughing; using cough medication.  Taking Singulair 10mg  daily; Atrovent nasal spray without improvement.  Using Flonase/Veramyst without improvement.  Afrin works really well; has been using Afrin but stopped 4 days; quit using it when had teeth retracted; reused it last week.    Restarted nebulizers last week.  No wheezing; gray mucous and loose.  No SOB.    2. Tachycardia: went to urologist earlier in week; heart rate was 109; cannot feel rapid heart rate.  Denies chest pain,palpitations, SOB, leg swelling, diaphoresis. No history of irregular heart rhythm.  Admits to excessive stress; also recently ill as outlined above.    Review of Systems  Constitutional: Negative for fever, chills, diaphoresis, activity change, appetite change and fatigue.  HENT: Positive for congestion, rhinorrhea, dental problem, postnasal drip and sinus pressure. Negative for ear pain, sore throat, drooling, mouth sores, trouble swallowing and voice change.   Respiratory: Positive for cough. Negative for shortness of breath, wheezing and stridor.   Cardiovascular: Negative for chest pain, palpitations and leg swelling.  Skin: Negative for rash.  Neurological: Negative for dizziness, weakness and light-headedness.    Past Medical History  Diagnosis Date  . Allergy   . Asthma   . Diabetes mellitus without complication   . Cataract   . Arthritis   . Polymyalgia rheumatica     maintained on Prednisone,  Plaquenil.  Marland Kitchen COPD (chronic obstructive pulmonary disease)     Past Surgical History  Procedure Laterality Date  . Appendectomy    . Hernia repair    . Prostate surgery    . Coronary artery bypass graft      Prior to Admission medications   Medication Sig Start Date End Date Taking? Authorizing Provider  amLODipine (NORVASC) 10 MG tablet Take 10 mg by mouth daily.   Yes Historical Provider, MD  aspirin 81 MG tablet Take 81 mg by mouth daily.   Yes Historical Provider, MD  azithromycin (ZITHROMAX) 250 MG tablet Two tablets daily x 5 days 08/08/12  Yes Wardell Honour, MD  CHERATUSSIN AC 100-10 MG/5ML syrup TAKE ONE TEASPOONFUL BY MOUTH 4 TIMES DAILY AS NEEDED FOR COUGH 09/25/12  Yes Wardell Honour, MD  finasteride (PROSCAR) 5 MG tablet Take 5 mg by mouth daily.   Yes Historical Provider, MD  fluticasone (VERAMYST) 27.5 MCG/SPRAY nasal spray Place 2 sprays into the nose 2 (two) times daily.   Yes Historical Provider, MD  folic acid (FOLVITE) 1 MG tablet Take 1 mg by mouth daily.   Yes Historical Provider, MD  gabapentin (NEURONTIN) 300 MG capsule Take 1 capsule (300 mg total) by mouth 3 (three) times daily. 05/21/12  Yes Posey Boyer, MD  glipiZIDE (GLUCOTROL) 10 MG tablet Take 10 mg by mouth 2 (two) times daily before a meal.   Yes Historical Provider, MD  guaiFENesin-codeine (ROBITUSSIN AC) 100-10 MG/5ML syrup Take 5 mLs by mouth 4 (four) times daily as needed  for cough. (sugar free) 08/08/12  Yes Wardell Honour, MD  hydrochlorothiazide (HYDRODIURIL) 25 MG tablet Take 25 mg by mouth daily.   Yes Historical Provider, MD  HYDROcodone-acetaminophen (NORCO/VICODIN) 5-325 MG per tablet Take 1 tablet by mouth every 6 (six) hours as needed.   Yes Historical Provider, MD  hydroxychloroquine (PLAQUENIL) 200 MG tablet Take by mouth daily.   Yes Historical Provider, MD  INSULIN GLARGINE  Inject 100 mg into the skin.   Yes Historical Provider, MD  ipratropium (ATROVENT) 0.02 % nebulizer solution Take  2.5 mLs (500 mcg total) by nebulization 4 (four) times daily. 08/08/12  Yes Wardell Honour, MD  ipratropium (ATROVENT) 0.03 % nasal spray Place 2 sprays into the nose 2 (two) times daily. 08/17/12  Yes Wardell Honour, MD  levofloxacin (LEVAQUIN) 750 MG tablet Take 1 tablet (750 mg total) by mouth daily. 08/17/12  Yes Wardell Honour, MD  losartan (COZAAR) 100 MG tablet Take 100 mg by mouth daily.   Yes Historical Provider, MD  methotrexate (RHEUMATREX) 2.5 MG tablet Take 2.5 mg by mouth once a week. Caution:Chemotherapy. Protect from light.   Yes Historical Provider, MD  mupirocin ointment (BACTROBAN) 2 % Apply topically 3 (three) times daily. 06/11/12  Yes Darlyne Russian, MD  ondansetron (ZOFRAN ODT) 4 MG disintegrating tablet Take one every 6 hours as needed for nausea 05/21/12  Yes Posey Boyer, MD  predniSONE (DELTASONE) 5 MG tablet Take 1-2 tablets (5-10 mg total) by mouth daily. 08/17/12  Yes Wardell Honour, MD  Tamsulosin HCl (FLOMAX) 0.4 MG CAPS Take by mouth.   Yes Historical Provider, MD    Allergies  Allergen Reactions  . Ace Inhibitors   . Amoxicillin   . Augmentin (Amoxicillin-Pot Clavulanate)   . Ciprocinonide (Fluocinolone)   . Flunisolide   . Metformin And Related   . Sertraline   . Sulindac   . Terazosin     History   Social History  . Marital Status: Married    Spouse Name: N/A    Number of Children: N/A  . Years of Education: N/A   Occupational History  . Not on file.   Social History Main Topics  . Smoking status: Former Research scientist (life sciences)  . Smokeless tobacco: Not on file  . Alcohol Use: No  . Drug Use: No  . Sexually Active: Yes    Birth Control/ Protection: None   Other Topics Concern  . Not on file   Social History Narrative  . No narrative on file    History reviewed. No pertinent family history.     Objective:   Physical Exam  Nursing note and vitals reviewed. Constitutional: He is oriented to person, place, and time. He appears well-developed and  well-nourished. No distress.  HENT:  Head: Normocephalic and atraumatic.  Right Ear: External ear normal.  Left Ear: External ear normal.  Nose: Right sinus exhibits maxillary sinus tenderness. Right sinus exhibits no frontal sinus tenderness. Left sinus exhibits maxillary sinus tenderness. Left sinus exhibits no frontal sinus tenderness.  Mouth/Throat: Oropharynx is clear and moist.  Eyes: Conjunctivae are normal. Pupils are equal, round, and reactive to light.  Neck: Normal range of motion. Neck supple. No thyromegaly present.  Cardiovascular: An irregularly irregular rhythm present. Tachycardia present.   Pulmonary/Chest: Effort normal and breath sounds normal. No respiratory distress. He has no wheezes.  Neurological: He is alert and oriented to person, place, and time.  Skin: Skin is warm and dry. He is not diaphoretic.  Psychiatric: He has a normal mood and affect. His behavior is normal.    EKG:  NSR; PVCs, PACs.    Assessment & Plan:  Tachycardia - Plan: EKG 12-Lead  Maxillary sinusitis, acute - Plan: levofloxacin (LEVAQUIN) 750 MG tablet  Conjunctivitis - Plan: erythromycin ophthalmic ointment  PVC's (premature ventricular contractions)   1.  Acute Maxillary Sinusitis:  New. Rx for Levaquin 750mg  one daily x 7 days.  Continue Veramyst, Atrovent, nasal irrigation bid.  Rx for Cheratussin. 2.  Conjunctivitis:  New.  Rx for erythromycin ointment provided. 3.  Tachycardia:  Mild; asymptomatic; currently sick and with stressors. 4.  PVCs:  Asymptomatic.    Meds ordered this encounter  Medications  . levofloxacin (LEVAQUIN) 750 MG tablet    Sig: Take 1 tablet (750 mg total) by mouth daily.    Dispense:  7 tablet    Refill:  0  . erythromycin ophthalmic ointment    Sig: Place into both eyes at bedtime.    Dispense:  3.5 g    Refill:  0  . DISCONTD: guaiFENesin-codeine (CHERATUSSIN AC) 100-10 MG/5ML syrup    Sig: Take 5 mLs by mouth 3 (three) times daily as needed for  cough.    Dispense:  240 mL    Refill:  0  . guaiFENesin-codeine (CHERATUSSIN AC) 100-10 MG/5ML syrup    Sig: Take 5 mLs by mouth 3 (three) times daily as needed for cough.    Dispense:  240 mL    Refill:  0

## 2012-12-26 ENCOUNTER — Ambulatory Visit: Payer: Non-veteran care | Attending: Nurse Practitioner

## 2012-12-26 DIAGNOSIS — M545 Low back pain, unspecified: Secondary | ICD-10-CM | POA: Insufficient documentation

## 2012-12-26 DIAGNOSIS — IMO0001 Reserved for inherently not codable concepts without codable children: Secondary | ICD-10-CM | POA: Insufficient documentation

## 2012-12-26 DIAGNOSIS — R293 Abnormal posture: Secondary | ICD-10-CM | POA: Insufficient documentation

## 2012-12-26 DIAGNOSIS — R5381 Other malaise: Secondary | ICD-10-CM | POA: Insufficient documentation

## 2012-12-29 ENCOUNTER — Ambulatory Visit: Payer: Non-veteran care | Admitting: Physical Therapy

## 2013-01-09 ENCOUNTER — Ambulatory Visit: Payer: Non-veteran care | Admitting: Physical Therapy

## 2013-01-11 ENCOUNTER — Ambulatory Visit: Payer: Non-veteran care | Admitting: Physical Therapy

## 2013-01-12 ENCOUNTER — Ambulatory Visit: Payer: Non-veteran care | Admitting: Physical Therapy

## 2013-01-18 ENCOUNTER — Ambulatory Visit: Payer: Non-veteran care | Attending: Nurse Practitioner | Admitting: Physical Therapy

## 2013-01-18 DIAGNOSIS — IMO0001 Reserved for inherently not codable concepts without codable children: Secondary | ICD-10-CM | POA: Insufficient documentation

## 2013-01-18 DIAGNOSIS — R293 Abnormal posture: Secondary | ICD-10-CM | POA: Insufficient documentation

## 2013-01-18 DIAGNOSIS — M545 Low back pain, unspecified: Secondary | ICD-10-CM | POA: Insufficient documentation

## 2013-01-18 DIAGNOSIS — R5381 Other malaise: Secondary | ICD-10-CM | POA: Insufficient documentation

## 2013-01-23 ENCOUNTER — Other Ambulatory Visit: Payer: Self-pay

## 2013-01-23 ENCOUNTER — Encounter: Payer: Non-veteran care | Admitting: Physical Therapy

## 2013-01-23 DIAGNOSIS — E1140 Type 2 diabetes mellitus with diabetic neuropathy, unspecified: Secondary | ICD-10-CM

## 2013-01-23 DIAGNOSIS — J309 Allergic rhinitis, unspecified: Secondary | ICD-10-CM

## 2013-01-23 MED ORDER — IPRATROPIUM BROMIDE 0.03 % NA SOLN: 2.0000 | Freq: Two times a day (BID) | NASAL | Status: DC

## 2013-01-23 MED ORDER — GABAPENTIN 300 MG PO CAPS: 300.0000 mg | ORAL_CAPSULE | Freq: Three times a day (TID) | ORAL | Status: DC

## 2013-01-25 ENCOUNTER — Ambulatory Visit: Payer: Non-veteran care | Admitting: Physical Therapy

## 2013-01-26 ENCOUNTER — Encounter: Payer: Non-veteran care | Admitting: Physical Therapy

## 2013-02-01 ENCOUNTER — Ambulatory Visit: Payer: Non-veteran care | Admitting: Physical Therapy

## 2013-02-06 ENCOUNTER — Encounter: Payer: Non-veteran care | Admitting: Physical Therapy

## 2013-02-08 ENCOUNTER — Ambulatory Visit: Payer: Non-veteran care | Admitting: Physical Therapy

## 2013-02-10 ENCOUNTER — Other Ambulatory Visit: Payer: Self-pay | Admitting: Family Medicine

## 2013-02-15 ENCOUNTER — Ambulatory Visit: Payer: Non-veteran care | Admitting: Physical Therapy

## 2013-02-22 ENCOUNTER — Ambulatory Visit: Payer: Non-veteran care | Attending: Nurse Practitioner

## 2013-02-22 ENCOUNTER — Encounter: Payer: Non-veteran care | Admitting: Physical Therapy

## 2013-02-22 DIAGNOSIS — M545 Low back pain, unspecified: Secondary | ICD-10-CM | POA: Insufficient documentation

## 2013-02-22 DIAGNOSIS — R5381 Other malaise: Secondary | ICD-10-CM | POA: Insufficient documentation

## 2013-02-22 DIAGNOSIS — R293 Abnormal posture: Secondary | ICD-10-CM | POA: Insufficient documentation

## 2013-02-22 DIAGNOSIS — IMO0001 Reserved for inherently not codable concepts without codable children: Secondary | ICD-10-CM | POA: Insufficient documentation

## 2013-03-01 NOTE — Telephone Encounter (Signed)
error 

## 2013-03-05 ENCOUNTER — Ambulatory Visit (INDEPENDENT_AMBULATORY_CARE_PROVIDER_SITE_OTHER): Payer: Medicare Other | Admitting: Family Medicine

## 2013-03-05 ENCOUNTER — Ambulatory Visit: Payer: Medicare Other

## 2013-03-05 VITALS — BP 140/76 | HR 65 | Temp 98.6°F | Resp 16 | Ht 70.0 in | Wt 224.0 lb

## 2013-03-05 DIAGNOSIS — R0781 Pleurodynia: Secondary | ICD-10-CM

## 2013-03-05 DIAGNOSIS — J441 Chronic obstructive pulmonary disease with (acute) exacerbation: Secondary | ICD-10-CM

## 2013-03-05 DIAGNOSIS — S2341XA Sprain of ribs, initial encounter: Secondary | ICD-10-CM

## 2013-03-05 DIAGNOSIS — R079 Chest pain, unspecified: Secondary | ICD-10-CM

## 2013-03-05 LAB — POCT CBC
Granulocyte percent: 74.2 %G (ref 37–80)
HCT, POC: 45 % (ref 43.5–53.7)
Hemoglobin: 14.2 g/dL (ref 14.1–18.1)
Lymph, poc: 2 (ref 0.6–3.4)
MCH, POC: 30 pg (ref 27–31.2)
MCHC: 31.6 g/dL — AB (ref 31.8–35.4)
MCV: 94.9 fL (ref 80–97)
MID (cbc): 0.7 (ref 0–0.9)
MPV: 7.4 fL (ref 0–99.8)
POC Granulocyte: 7.8 — AB (ref 2–6.9)
POC LYMPH PERCENT: 19.5 %L (ref 10–50)
POC MID %: 6.3 %M (ref 0–12)
Platelet Count, POC: 261 10*3/uL (ref 142–424)
RBC: 4.74 M/uL (ref 4.69–6.13)
RDW, POC: 17.3 %
WBC: 10.5 10*3/uL — AB (ref 4.6–10.2)

## 2013-03-05 LAB — POCT SEDIMENTATION RATE: POCT SED RATE: 62 mm/hr — AB (ref 0–22)

## 2013-03-05 MED ORDER — GUAIFENESIN-CODEINE 100-10 MG/5ML PO SYRP: 5.0000 mL | ORAL_SOLUTION | Freq: Four times a day (QID) | ORAL | Status: DC | PRN

## 2013-03-05 MED ORDER — METHOCARBAMOL 500 MG PO TABS: 500.0000 mg | ORAL_TABLET | Freq: Four times a day (QID) | ORAL | Status: DC | PRN

## 2013-03-05 NOTE — Progress Notes (Addendum)
Subjective:    Patient ID: Jacob [name], male    DOB: August 17, 1940, 72 y.o.   MRN: [name] This chart was scribed for Jacob Honour, MD by Rolanda Lundborg, ED Scribe. This patient was seen in room 10 and the patient's care was started at 2:36 PM.  Chief Complaint  Patient presents with   LUQ pain    2 days     HPI HPI Comments: Jacob [name] is a 72 y.o. male with a h/o DM type II, CAD, and COPD who presents to the [facility] complaining of unchanged left upper rib pain onset 2 days ago, especially when he moves or gets in or out of the car. The pain is worse with deep breathing. He states it feels similar to his pain from Shingles except that it lasts much longer. Pt states he had a cough last week that has resolved. He reports feeling cold the last few days but no chills. He reports numbness and tingling that is baseline. He states both his ankles started swelling one week ago but it resolved. He denies fever, CP, SOB, nausea, vomiting, diarrhea, constipation, urinary symptoms, abdominal pain. He denies sick contacts. He denies sitting in the car for long periods. He denies doing any lifting recently.  Took Tylenol two nights ago and slept a bit better.    Pt states he has stopped taking methotrexate and Plaquenil because he states he does not have arthritis. He denies feeling any different after coming off these medications.  PCP Youlanda Roys, MD  Past Medical History  Diagnosis Date   Allergy    Asthma    Diabetes mellitus without complication    Cataract    Arthritis    Polymyalgia rheumatica     maintained on Prednisone, Plaquenil.   COPD (chronic obstructive pulmonary disease)    Current Outpatient Prescriptions on File Prior to Visit  Medication Sig Dispense Refill   amLODipine (NORVASC) 10 MG tablet Take 10 mg by mouth daily.       erythromycin ophthalmic ointment Place into both eyes at bedtime.  3.5 g  0   finasteride (PROSCAR) 5 MG  tablet Take 5 mg by mouth daily.       fluticasone (VERAMYST) 27.5 MCG/SPRAY nasal spray Place 2 sprays into the nose 2 (two) times daily.       gabapentin (NEURONTIN) 300 MG capsule Take 1 capsule (300 mg total) by mouth 3 (three) times daily. PATIENT NEEDS OFFICE VISIT FOR ADDITIONAL REFILLS  90 capsule  0   glipiZIDE (GLUCOTROL) 10 MG tablet Take 10 mg by mouth 2 (two) times daily before a meal.       hydrochlorothiazide (HYDRODIURIL) 25 MG tablet Take 25 mg by mouth daily.       hydroxychloroquine (PLAQUENIL) 200 MG tablet Take by mouth daily.       INSULIN GLARGINE [place] Inject 100 mg into the skin.       ipratropium (ATROVENT) 0.02 % nebulizer solution Take 2.5 mLs (500 mcg total) by nebulization 4 (four) times daily.  75 mL  12   ipratropium (ATROVENT) 0.03 % nasal spray Place 2 sprays into the nose 2 (two) times daily.  30 mL  6   losartan (COZAAR) 100 MG tablet Take 100 mg by mouth daily.       predniSONE (DELTASONE) 5 MG tablet Take 1-2 tablets (5-10 mg total) by mouth daily.  60 tablet  3   Tamsulosin HCl (FLOMAX) 0.4 MG  CAPS Take by mouth.       No current facility-administered medications on file prior to visit.   Allergies  Allergen Reactions   Ace Inhibitors    Amoxicillin    Augmentin [Amoxicillin-Pot Clavulanate]    Ciprocinonide [Fluocinolone]    Flunisolide    Metformin And Related    Sertraline    Sulindac    Terazosin     Review of Systems  Constitutional: Negative for fever and chills.  Respiratory: Negative for cough.   Cardiovascular: Negative for chest pain and leg swelling.  Gastrointestinal: Negative for nausea, vomiting, abdominal pain, diarrhea, constipation and blood in stool.  Genitourinary: Negative for dysuria, urgency, frequency, hematuria, decreased urine volume, enuresis and difficulty urinating.  Musculoskeletal: Positive for arthralgias (left upper ribs).  Neurological: Positive for numbness (baseline).       Objective:    Physical Exam  Nursing note and vitals reviewed. Constitutional: He is oriented to person, place, and time. He appears well-developed and well-nourished. No distress.  HENT:  Head: Normocephalic and atraumatic.  Eyes: Conjunctivae and EOM are normal. Pupils are equal, round, and reactive to light.  Neck: Neck supple.  Cardiovascular: Normal rate and regular rhythm.   No murmur heard. Pulmonary/Chest: Effort normal and breath sounds normal. No respiratory distress. He has no wheezes. He has no rales.  Abdominal: Soft. Bowel sounds are normal. He exhibits no distension. There is no tenderness. There is no rebound and no guarding.  Musculoskeletal:       Cervical back: He exhibits decreased range of motion. He exhibits no tenderness and no bony tenderness.       Thoracic back: He exhibits normal range of motion, no tenderness and no bony tenderness.       Lumbar back: He exhibits decreased range of motion. He exhibits no tenderness.  +TTP L lateral and anterior ribs distally inferior to nipple region of chest on L.  Pain with laying supine, lateral bending, rotating side to side.  Lymphadenopathy:    He has no cervical adenopathy.  Neurological: He is alert and oriented to person, place, and time.  Skin: Skin is warm and dry. He is not diaphoretic.  Psychiatric: He has a normal mood and affect. His behavior is normal.     Filed Vitals:   03/05/13 1410  BP: 140/76  Pulse: 65  Temp: 98.6 F (37 C)  TempSrc: Axillary  Resp: 16  Height: 5\' 10"  (1.778 m)  Weight: 224 lb (101.606 kg)  SpO2: 98%   [facility] reading (PRIMARY) by [name], MD.  L RIBS WITH CHEST: NAD; +STERNOTOMY WIRES; NO FRACTURE OF RIB.  NO INFILTRATE.   Results for orders placed in visit on 03/05/13  POCT CBC      Result Value Range   WBC 10.5 (*) 4.6 - 10.2 K/uL   Lymph, poc 2.0  0.6 - 3.4   POC LYMPH PERCENT 19.5  10 - 50 %L   MID (cbc) 0.7  0 - 0.9   POC MID % 6.3  0 - 12 %M   POC Granulocyte 7.8 (*) 2 - 6.9     Granulocyte percent 74.2  37 - 80 %G   RBC 4.74  4.69 - 6.13 M/uL   Hemoglobin 14.2  14.1 - 18.1 g/dL   HCT, POC 45.0  43.5 - 53.7 %   MCV 94.9  80 - 97 fL   MCH, POC 30.0  27 - 31.2 pg   MCHC 31.6 (*) 31.8 - 35.4 g/dL  RDW, POC 17.3     Platelet Count, POC 261  142 - 424 K/uL   MPV 7.4  0 - 99.8 fL       Assessment & Plan:   1. Rib pain on left side   2. Sprain, ribs, initial encounter   3. COPD exacerbation    1. L rib pain/strain:  New. Rx or Robaxin provided.  Recommend rest, heat, avoid heavy lifting for two weeks.   2.  COPD: stable; refill of Robitussin AC provided.  Meds ordered this encounter  Medications   methocarbamol (ROBAXIN) 500 MG tablet    Sig: Take 1 tablet (500 mg total) by mouth every 6 (six) hours as needed for muscle spasms.    Dispense:  30 tablet    Refill:  0   guaiFENesin-codeine (ROBITUSSIN AC) 100-10 MG/5ML syrup    Sig: Take 5 mLs by mouth 4 (four) times daily as needed for cough. (sugar free)    Dispense:  240 mL    Refill:  0    I personally performed the services described in this documentation, which was scribed in my presence.  The recorded information has been reviewed and is accurate.  [name], M.D.  [facility] [address] [place], [place]  [id number] [phone] phone [phone] fax

## 2013-03-05 NOTE — Patient Instructions (Signed)
1. Avoid lifting for the next two weeks.   2.  Take Tylenol for pain during the day.   3.  Return if worsens.

## 2013-07-12 ENCOUNTER — Ambulatory Visit (INDEPENDENT_AMBULATORY_CARE_PROVIDER_SITE_OTHER): Payer: Medicare Other | Admitting: Family Medicine

## 2013-07-12 ENCOUNTER — Ambulatory Visit: Payer: Medicare Other

## 2013-07-12 VITALS — BP 132/88 | HR 95 | Temp 98.6°F | Resp 16 | Ht 68.0 in | Wt 221.4 lb

## 2013-07-12 DIAGNOSIS — J0100 Acute maxillary sinusitis, unspecified: Secondary | ICD-10-CM

## 2013-07-12 DIAGNOSIS — R05 Cough: Secondary | ICD-10-CM

## 2013-07-12 DIAGNOSIS — J45901 Unspecified asthma with (acute) exacerbation: Secondary | ICD-10-CM

## 2013-07-12 DIAGNOSIS — J309 Allergic rhinitis, unspecified: Secondary | ICD-10-CM

## 2013-07-12 DIAGNOSIS — E119 Type 2 diabetes mellitus without complications: Secondary | ICD-10-CM

## 2013-07-12 DIAGNOSIS — R059 Cough, unspecified: Secondary | ICD-10-CM

## 2013-07-12 DIAGNOSIS — J441 Chronic obstructive pulmonary disease with (acute) exacerbation: Secondary | ICD-10-CM

## 2013-07-12 LAB — POCT CBC
Granulocyte percent: 81.8 %G — AB (ref 37–80)
HCT, POC: 46.7 % (ref 43.5–53.7)
Hemoglobin: 14.5 g/dL (ref 14.1–18.1)
Lymph, poc: 1 (ref 0.6–3.4)
MCH, POC: 29.7 pg (ref 27–31.2)
MCHC: 31 g/dL — AB (ref 31.8–35.4)
MCV: 95.4 fL (ref 80–97)
MID (cbc): 0.5 (ref 0–0.9)
MPV: 7.6 fL (ref 0–99.8)
POC Granulocyte: 6.5 (ref 2–6.9)
POC LYMPH PERCENT: 12.2 %L (ref 10–50)
POC MID %: 6 %M (ref 0–12)
Platelet Count, POC: 249 10*3/uL (ref 142–424)
RBC: 4.89 M/uL (ref 4.69–6.13)
RDW, POC: 16.4 %
WBC: 8 10*3/uL (ref 4.6–10.2)

## 2013-07-12 LAB — GLUCOSE, POCT (MANUAL RESULT ENTRY): POC Glucose: 166 mg/dl — AB (ref 70–99)

## 2013-07-12 MED ORDER — AMOXICILLIN-POT CLAVULANATE 875-125 MG PO TABS: 1.0000 | ORAL_TABLET | Freq: Two times a day (BID) | ORAL | Status: DC

## 2013-07-12 MED ORDER — GUAIFENESIN-CODEINE 100-10 MG/5ML PO SYRP: 5.0000 mL | ORAL_SOLUTION | Freq: Four times a day (QID) | ORAL | Status: DC | PRN

## 2013-07-12 MED ORDER — PREDNISONE 20 MG PO TABS: ORAL_TABLET | ORAL | Status: DC

## 2013-07-12 NOTE — Patient Instructions (Signed)
1.  Use nebulizer four times daily.

## 2013-07-12 NOTE — Progress Notes (Addendum)
Subjective:    Patient ID: Jacob [name], male    DOB: 11-04-1940, 73 y.o.   MRN: [id number]  HPI This chart was scribed for Sanjay Broadfoot-MD, by Lovena Le Day, Scribe. This patient was seen in room 5 and the patient's care was started at 2:19 PM.  HPI Comments: DREXLER FALCON is a 73 y.o. male who presents to the [facility] for a constant, gradually worsened cough, nasal congestion and wheezing, onset x 5 days ago.   He reports along with this illness he has not been sleeping well b/c he has chronic back pain and has been worried about whether or not he should have back surgery and has a hx of scoliosis, stenosis and disc problems.   Over the past x5 days with his he has been w/a harsh cough. He has clear d/c from his nose, rhinorrhea, watery/itchy eyes although he reports he had a normal allergy test w/an allergist but found out since he was on prednisone while he had the test, it might have affected his results. He has been using his Nebulizer x3 times/day. Saturday night, he had an episode in which he became more SOB associated w/a coughing fit which took about 10 minutes to subside. His wife was in the house and called 911 but did not transport b/c he felt better 10 minutes after it started. He last used his nebulizer this AM about x5 hours ago. He did not use his inhaler during his coughing fit on Saturday night. He reports associated left ear pain, sore throat, sweats and HA. He reports that he did have some chills and dry heaving this morning. He denies fever. He has been using a Neti pot at home for his congestion about 4 or 5 times per day. He also tried using Robitussin last PM w/mild relief as well as nasal spray.  Scant sputum production.  +SOB really walking around.  Cannot tolerate Mucinex.  He is still working and states it helps him stay more active. He checked his BS this AM and it was 93.   Past Medical History  Diagnosis Date  . Allergy   . Asthma   .  Diabetes mellitus without complication   . Cataract   . Arthritis   . Polymyalgia rheumatica     maintained on Prednisone, Plaquenil.  Marland Kitchen COPD (chronic obstructive pulmonary disease)     Allergies  Allergen Reactions  . Ace Inhibitors   . Amoxicillin   . Ciprocinonide [Fluocinolone]   . Flunisolide   . Metformin And Related   . Sertraline   . Sulindac   . Terazosin     Meds ordered this encounter  Medications  . predniSONE (DELTASONE) 20 MG tablet    Sig: Three tablets daily x 2 days then two tablets daily x 5 days then one tablet daily x 5 days    Dispense:  21 tablet    Refill:  0  . amoxicillin-clavulanate (AUGMENTIN) 875-125 MG per tablet    Sig: Take 1 tablet by mouth 2 (two) times daily.    Dispense:  20 tablet    Refill:  0  . guaiFENesin-codeine (ROBITUSSIN AC) 100-10 MG/5ML syrup    Sig: Take 5 mLs by mouth 4 (four) times daily as needed for cough. (sugar free)    Dispense:  480 mL    Refill:  0  . montelukast (SINGULAIR) 10 MG tablet    Sig: Take 10 mg by mouth at bedtime.   History  Social History  . Marital Status: Married    Spouse Name: N/A    Number of Children: N/A  . Years of Education: N/A   Occupational History  . Not on file.   Social History Main Topics  . Smoking status: Former Research scientist (life sciences)  . Smokeless tobacco: Not on file  . Alcohol Use: No  . Drug Use: No  . Sexual Activity: Yes    Birth Control/ Protection: None   Other Topics Concern  . Not on file   Social History Narrative  . No narrative on file    Review of Systems  Constitutional: Positive for chills. Negative for fever.  HENT: Positive for congestion, ear pain, postnasal drip, rhinorrhea, sneezing and sore throat.   Respiratory: Positive for cough, shortness of breath and wheezing. Negative for stridor.   Cardiovascular: Negative for chest pain and leg swelling.  Gastrointestinal: Negative for nausea and vomiting.  Musculoskeletal: Negative for back pain.  Skin: Negative  for rash.  All other systems reviewed and are negative.     Objective:   Physical Exam  Nursing note and vitals reviewed. Constitutional: He is oriented to person, place, and time. He appears well-developed and well-nourished. No distress.  HENT:  Head: Normocephalic and atraumatic.  Right Ear: External ear normal.  Left Ear: External ear normal.  Nose: No mucosal edema or rhinorrhea. Right sinus exhibits no maxillary sinus tenderness and no frontal sinus tenderness. Left sinus exhibits no maxillary sinus tenderness and no frontal sinus tenderness.  Mouth/Throat: Mucous membranes are normal. Oropharyngeal exudate present. No posterior oropharyngeal edema, posterior oropharyngeal erythema or tonsillar abscesses.  Eyes: Conjunctivae are normal. Right eye exhibits no discharge. Left eye exhibits no discharge.  Neck: Normal range of motion.  Cardiovascular: Normal rate, regular rhythm and normal heart sounds.  Exam reveals no gallop and no friction rub.   No murmur heard. Pulmonary/Chest: Effort normal and breath sounds normal. No accessory muscle usage. Not tachypneic. No respiratory distress. He has no wheezes. He has no rales.  Musculoskeletal: Normal range of motion. He exhibits no edema.  Lymphadenopathy:    He has no cervical adenopathy.  Neurological: He is alert and oriented to person, place, and time.  Skin: Skin is warm and dry. He is not diaphoretic.  Psychiatric: He has a normal mood and affect. Thought content normal.   Triage Vitals: BP 132/88  Pulse 95  Temp(Src) 98.6 F (37 C) (Oral)  Resp 16  Ht 5\' 8"  (1.727 m)  Wt 221 lb 6.4 oz (100.426 kg)  BMI 33.67 kg/m2  SpO2 96%     Results for orders placed in visit on 07/12/13  POCT CBC      Result Value Ref Range   WBC 8.0  4.6 - 10.2 K/uL   Lymph, poc 1.0  0.6 - 3.4   POC LYMPH PERCENT 12.2  10 - 50 %L   MID (cbc) 0.5  0 - 0.9   POC MID % 6.0  0 - 12 %M   POC Granulocyte 6.5  2 - 6.9   Granulocyte percent 81.8 (*)  37 - 80 %G   RBC 4.89  4.69 - 6.13 M/uL   Hemoglobin 14.5  14.1 - 18.1 g/dL   HCT, POC 46.7  43.5 - 53.7 %   MCV 95.4  80 - 97 fL   MCH, POC 29.7  27 - 31.2 pg   MCHC 31.0 (*) 31.8 - 35.4 g/dL   RDW, POC 16.4     Platelet Count,  POC 249  142 - 424 K/uL   MPV 7.6  0 - 99.8 fL  GLUCOSE, POCT (MANUAL RESULT ENTRY)      Result Value Ref Range   POC Glucose 166 (*) 70 - 99 mg/dl   [facility] reading (PRIMARY) by  Dr. Tamala Julian. CXR: NAD   Assessment & Plan:  Cough - Plan: DG Chest 2 View, POCT CBC, POCT glucose (manual entry)  Asthma with acute exacerbation - Plan: DG Chest 2 View, POCT CBC, POCT glucose (manual entry)  Type II or unspecified type diabetes mellitus without mention of complication, not stated as uncontrolled - Plan: POCT glucose (manual entry)  COPD exacerbation - Plan: guaiFENesin-codeine (ROBITUSSIN AC) 100-10 MG/5ML syrup  Acute maxillary sinusitis  Allergic rhinitis, cause unspecified  1. Asthma exacerbation:  New.  Rx for Prednisone taper; increase nebulizer to qid use.   2.  Acute sinusitis maxillary:  New. Rx for Augmentin provided; rx for Robitussin with codeine also provided; continue allergy treatment.  3.  Allergic Rhinitis: uncontrolled; continue current allergy treatment. 4. DMII: controlled; monitor sugars closely while taking increased Prednisone taper.  Meds ordered this encounter  Medications  . predniSONE (DELTASONE) 20 MG tablet    Sig: Three tablets daily x 2 days then two tablets daily x 5 days then one tablet daily x 5 days    Dispense:  21 tablet    Refill:  0  . amoxicillin-clavulanate (AUGMENTIN) 875-125 MG per tablet    Sig: Take 1 tablet by mouth 2 (two) times daily.    Dispense:  20 tablet    Refill:  0  . guaiFENesin-codeine (ROBITUSSIN AC) 100-10 MG/5ML syrup    Sig: Take 5 mLs by mouth 4 (four) times daily as needed for cough. (sugar free)    Dispense:  480 mL    Refill:  0  . montelukast (SINGULAIR) 10 MG tablet    Sig: Take 10 mg by  mouth at bedtime.    I personally performed the services described in this documentation, which was scribed in my presence. The recorded information has been reviewed and is accurate.   [name], M.D.  [facility] [address] [place], [place]  [address] [phone] phone [phone] fax

## 2013-07-16 ENCOUNTER — Ambulatory Visit (INDEPENDENT_AMBULATORY_CARE_PROVIDER_SITE_OTHER): Payer: Medicare Other | Admitting: Family Medicine

## 2013-07-16 ENCOUNTER — Ambulatory Visit: Payer: Medicare Other

## 2013-07-16 VITALS — BP 134/70 | HR 72 | Temp 97.9°F | Resp 18 | Ht 68.5 in | Wt 220.8 lb

## 2013-07-16 DIAGNOSIS — J0100 Acute maxillary sinusitis, unspecified: Secondary | ICD-10-CM

## 2013-07-16 DIAGNOSIS — J45901 Unspecified asthma with (acute) exacerbation: Secondary | ICD-10-CM

## 2013-07-16 DIAGNOSIS — R0989 Other specified symptoms and signs involving the circulatory and respiratory systems: Secondary | ICD-10-CM

## 2013-07-16 DIAGNOSIS — R0600 Dyspnea, unspecified: Secondary | ICD-10-CM

## 2013-07-16 DIAGNOSIS — R0609 Other forms of dyspnea: Secondary | ICD-10-CM

## 2013-07-16 DIAGNOSIS — E119 Type 2 diabetes mellitus without complications: Secondary | ICD-10-CM

## 2013-07-16 MED ORDER — ALBUTEROL SULFATE (2.5 MG/3ML) 0.083% IN NEBU
5.0000 mg | INHALATION_SOLUTION | Freq: Once | RESPIRATORY_TRACT | Status: AC
  Administered 2013-07-16: 5 mg via RESPIRATORY_TRACT

## 2013-07-16 MED ORDER — IPRATROPIUM BROMIDE 0.02 % IN SOLN
0.5000 mg | Freq: Once | RESPIRATORY_TRACT | Status: AC
  Administered 2013-07-16: 0.5 mg via RESPIRATORY_TRACT

## 2013-07-16 MED ORDER — FLUTICASONE-SALMETEROL 100-50 MCG/DOSE IN AEPB: 1.0000 | INHALATION_SPRAY | Freq: Two times a day (BID) | RESPIRATORY_TRACT | Status: DC

## 2013-07-16 MED ORDER — ALBUTEROL SULFATE HFA 108 (90 BASE) MCG/ACT IN AERS: 2.0000 | INHALATION_SPRAY | Freq: Four times a day (QID) | RESPIRATORY_TRACT | Status: DC | PRN

## 2013-07-16 NOTE — Progress Notes (Addendum)
Subjective:  This chart was scribed for [name], MD by Donato Schultz, Medical Scribe. This patient was seen in Room 14 and the patient's care was started at 8:40 AM.   Patient ID: Jacob [name], male    DOB: [birth year], 73 y.o.   MRN: [id number]  HPI HPI Comments: Jacob [name] is a 73 y.o. male who presents to the [facility] for a recheck for asthma exacerbation that he was diagnosed with 5 days ago at [facility].  The patient had a chest x-ray performed that was negative, a CBC that was normal.  He was prescribed Prednisone taper, Augmentin, Guaifenesin with codeine, and was told to increase his nebulizer to four times a day.  The patient states that he felt that his symptoms had resolved on Friday but returned yesterday at his grandson's baseball game when he experienced SOB/DOE and watery eyes.  He states that he was unable to sleep last night because due to wheezing.  He states that laying down aggravates his symptoms.  The patient denies nausea, fever, ear pain, vomiting, sore throat, sinus pressure, and diarrhea as associated symptoms.  He lists rhinorrhea consisting of milky, white mucous, cough, wheezing, chest congestion, and nasal congestion as associated symptoms.  The patient states that he is down to 40 mg of Prednisone and is using his nebulizer four times a day.  The patient states that his last nebulizer was last night.  He denies using an inhaler and states that he last used an inhaler 2 years ago; he would like an inhaler to travel with.  The patient states that his sugars are between 200 and 250 since he started taking Prednisone.  The patient's oxygen level was 95% today.  The patient denies taking Advair or Symbicort currently; [facility].  The patient states that his doctor at the [facility] is Dr. Lasandra Beech but he denies having a PCP that he sees regularly.  He states that goes to the [facility] in [place] every 6 months; his  next appointment is 08/03/13.    Walking around the house without DOE.  [place] SOB [place]field.     Past Medical History  Diagnosis Date  . Allergy   . Asthma   . Diabetes mellitus without complication   . Cataract   . Arthritis   . Polymyalgia rheumatica     maintained on Prednisone, Plaquenil.  Marland Kitchen COPD (chronic obstructive pulmonary disease)    Past Surgical History  Procedure Laterality Date  . Appendectomy    . Hernia repair    . Prostate surgery    . Coronary artery bypass graft     History reviewed. No pertinent family history. History   Social History  . Marital Status: Married    Spouse Name: N/A    Number of Children: N/A  . Years of Education: N/A   Occupational History  . Not on file.   Social History Main Topics  . Smoking status: Former Research scientist (life sciences)  . Smokeless tobacco: Not on file  . Alcohol Use: No  . Drug Use: No  . Sexual Activity: Yes    Birth Control/ Protection: None   Other Topics Concern  . Not on file   Social History Narrative  . No narrative on file   Allergies  Allergen Reactions  . Ace Inhibitors   . Amoxicillin   . Ciprocinonide [Fluocinolone]   . Flunisolide   . Metformin And  Related   . Sertraline   . Sulindac   . Terazosin    History   Social History  . Marital Status: Married    Spouse Name: N/A    Number of Children: N/A  . Years of Education: N/A   Occupational History  . Not on file.   Social History Main Topics  . Smoking status: Former Research scientist (life sciences)  . Smokeless tobacco: Not on file  . Alcohol Use: No  . Drug Use: No  . Sexual Activity: Yes    Birth Control/ Protection: None   Other Topics Concern  . Not on file   Social History Narrative  . No narrative on file    Review of Systems  Constitutional: Negative for fever, chills and diaphoresis.  HENT: Positive for congestion, postnasal drip and rhinorrhea. Negative for ear pain, sinus pressure, sore throat and trouble swallowing.   Respiratory:  Positive for cough, shortness of breath and wheezing.   Cardiovascular: Negative for chest pain and leg swelling.  Gastrointestinal: Negative for nausea, vomiting and diarrhea.  Psychiatric/Behavioral: Positive for sleep disturbance.  All other systems reviewed and are negative.    Objective:  Physical Exam  Nursing note and vitals reviewed. Constitutional: He is oriented to person, place, and time. He appears well-developed and well-nourished. No distress.  HENT:  Head: Normocephalic and atraumatic.  Right Ear: External ear normal.  Left Ear: External ear normal.  Mouth/Throat: No oropharyngeal exudate.  Eyes: Conjunctivae and EOM are normal. Pupils are equal, round, and reactive to light.  Neck: Normal range of motion.  Cardiovascular: Normal rate, regular rhythm and normal heart sounds.  Exam reveals no gallop and no friction rub.   No murmur heard. No pitting edema BLE.  Pulmonary/Chest: Effort normal. No respiratory distress. He has wheezes (mild). He has rhonchi (scattered). He has no rales.  Good air movement.    Musculoskeletal: Normal range of motion.  Lymphadenopathy:    He has no cervical adenopathy.  Neurological: He is alert and oriented to person, place, and time.  Skin: Skin is warm and dry. He is not diaphoretic.  Psychiatric: He has a normal mood and affect. His behavior is normal.   Before albuterol treatment, peak flow was 200. (predicted 540). ALBUTEROL 5MG /ATROVENT 0.5MG  NEBULIZER ADMINISTERED IN OFFICE. Post-nebulizer peak flow:  225, 225, 225. [facility] preliminary x-ray report read by Dr. Tamala Julian: CXR: NAD.    BP 134/70  Pulse 72  Temp(Src) 97.9 F (36.6 C) (Oral)  Resp 18  Ht 5' 8.5" (1.74 m)  Wt 220 lb 12.8 oz (100.154 kg)  BMI 33.08 kg/m2  SpO2 95%  PF 225 L/min Assessment & Plan:   Dyspnea - Plan: DG Chest 2 View, albuterol (PROVENTIL) (2.5 MG/3ML) 0.083% nebulizer solution 5 mg, ipratropium (ATROVENT) nebulizer solution 0.5 mg  Asthma with  acute exacerbation - Plan: DG Chest 2 View, albuterol (PROVENTIL) (2.5 MG/3ML) 0.083% nebulizer solution 5 mg, ipratropium (ATROVENT) nebulizer solution 0.5 mg  Diabetes mellitus, type 2  Acute maxillary sinusitis  1. Asthma exacerbation: worsening yesterday when [place].  S/p Albuterol/Atrovent nebulizer in office; rx for Albuterol FHA and Advair diskus provided.  Increase Prednisone to 60mg  today and then resume 40mg  daily.  Continue Augmentin.  RTC for acute worsening.  Continue Singulair daily and Atrovent nasal spray. 2.  Acute sinusitis: improving with Augmentin. 3.  DMII: stalbe; monitor sugars closely with Prednisone taper.  I personally performed the services described in this documentation, which was scribed in my presence.  The recorded  information has been reviewed and is accurate.  [name], M.D.  [facility] [address] [place], [place]  [phone] phone [phone] fax

## 2013-07-27 ENCOUNTER — Ambulatory Visit (INDEPENDENT_AMBULATORY_CARE_PROVIDER_SITE_OTHER): Payer: Medicare Other | Admitting: Family Medicine

## 2013-07-27 VITALS — BP 142/78 | HR 84 | Temp 98.1°F | Resp 18 | Ht 68.0 in | Wt 224.6 lb

## 2013-07-27 DIAGNOSIS — J45901 Unspecified asthma with (acute) exacerbation: Secondary | ICD-10-CM

## 2013-07-27 DIAGNOSIS — M7989 Other specified soft tissue disorders: Secondary | ICD-10-CM

## 2013-07-27 DIAGNOSIS — J0100 Acute maxillary sinusitis, unspecified: Secondary | ICD-10-CM

## 2013-07-27 DIAGNOSIS — I493 Ventricular premature depolarization: Secondary | ICD-10-CM

## 2013-07-27 DIAGNOSIS — J309 Allergic rhinitis, unspecified: Secondary | ICD-10-CM

## 2013-07-27 DIAGNOSIS — I4949 Other premature depolarization: Secondary | ICD-10-CM

## 2013-07-27 LAB — POCT CBC
Granulocyte percent: 85.6 %G — AB (ref 37–80)
HCT, POC: 44.4 % (ref 43.5–53.7)
Hemoglobin: 13.9 g/dL — AB (ref 14.1–18.1)
Lymph, poc: 1.2 (ref 0.6–3.4)
MCH, POC: 29.4 pg (ref 27–31.2)
MCHC: 31.3 g/dL — AB (ref 31.8–35.4)
MCV: 93.9 fL (ref 80–97)
MID (cbc): 0.5 (ref 0–0.9)
MPV: 8.1 fL (ref 0–99.8)
POC Granulocyte: 10 — AB (ref 2–6.9)
POC LYMPH PERCENT: 10.4 %L (ref 10–50)
POC MID %: 4 %M (ref 0–12)
Platelet Count, POC: 241 10*3/uL (ref 142–424)
RBC: 4.73 M/uL (ref 4.69–6.13)
RDW, POC: 16.3 %
WBC: 11.7 10*3/uL — AB (ref 4.6–10.2)

## 2013-07-27 LAB — POCT URINALYSIS DIPSTICK
Bilirubin, UA: NEGATIVE
Glucose, UA: NEGATIVE
Ketones, UA: NEGATIVE
Nitrite, UA: NEGATIVE
Protein, UA: 100
Spec Grav, UA: 1.015
Urobilinogen, UA: 0.2
pH, UA: 7

## 2013-07-27 MED ORDER — DOXYCYCLINE HYCLATE 100 MG PO CAPS: 100.0000 mg | ORAL_CAPSULE | Freq: Two times a day (BID) | ORAL | Status: DC

## 2013-07-27 MED ORDER — FLUTICASONE-SALMETEROL 100-50 MCG/DOSE IN AEPB: 1.0000 | INHALATION_SPRAY | Freq: Two times a day (BID) | RESPIRATORY_TRACT | Status: DC

## 2013-07-27 MED ORDER — FUROSEMIDE 20 MG PO TABS: 20.0000 mg | ORAL_TABLET | Freq: Every day | ORAL | Status: DC | PRN

## 2013-07-27 MED ORDER — MONTELUKAST SODIUM 10 MG PO TABS: 10.0000 mg | ORAL_TABLET | Freq: Every day | ORAL | Status: DC

## 2013-07-27 MED ORDER — CETIRIZINE HCL 10 MG PO TABS: 10.0000 mg | ORAL_TABLET | Freq: Every day | ORAL | Status: DC

## 2013-07-27 NOTE — Progress Notes (Signed)
Subjective:    Patient ID: Jacob [name], male    DOB: 10-22-1940, 73 y.o.   MRN: [name] This chart was scribed for Wardell Honour, MD by Anastasia Pall, ED Scribe. This patient was seen in room 4 and the patient's care was started at 2:23 PM.  07/27/2013  Follow-up and Medication Refill  HPI Jacob [name] is a 73 y.o. male   Pt reports being a little better since his last visit. He states he has not filled his Advair, but sees his [place] MD 05/26. He reports he started using Mucinex, blue box, 4 pills a day without any complications. He states his wheezing started improving some. He reports having finished his Augmentin. He reports using 2 puffs of his nasal spray daily. He does not take Claritin due to unsuccessful results. He denies having tried Zyrtec and Allegra. He is on Prednisone 8 mg.    He states he worked yesterday, went on a breathing machine last night, and this morning felt stopped up with nasal congestion. He reports having a coughing spell this morning that was very productive of thick, lumpy sputum. He states otherwise he coughs only intermittently, mostly at night. He denies any fever, diaphoresis, headache, sore throat, ear pain, rhinorrhea, and chest pain in the past 4 days. He reports having chills intermittently at baseline. He denies any increased SOB or fatigue recently.   He reports increased LE swelling in his ankles, onset 7-8 days ago, worse yesterday. He states he has been out of his fluid pills 9 days ago.   PCP - Youlanda Roys, MD  Review of Systems  Constitutional: Negative for fever, chills, diaphoresis and fatigue.  HENT: Positive for congestion. Negative for ear pain, rhinorrhea, sore throat and trouble swallowing.   Respiratory: Positive for cough (productive thick, lumpy sputum) and wheezing. Negative for chest tightness and shortness of breath.   Cardiovascular: Positive for leg swelling (bilateral ankles). Negative for chest pain.  Neurological: Negative  for headaches.   Past Medical History  Diagnosis Date   Allergy    Asthma    Diabetes mellitus without complication    Cataract    Arthritis    Polymyalgia rheumatica     maintained on Prednisone, Plaquenil.   COPD (chronic obstructive pulmonary disease)    Allergies  Allergen Reactions   Ace Inhibitors    Amoxicillin    Ciprocinonide [Fluocinolone]    Flunisolide    Metformin And Related    Sertraline    Sulindac    Terazosin    Current Outpatient Prescriptions  Medication Sig Dispense Refill   albuterol (PROVENTIL HFA;VENTOLIN HFA) 108 (90 BASE) MCG/ACT inhaler Inhale 2 puffs into the lungs every 6 (six) hours as needed for wheezing or shortness of breath.  1 Inhaler  2   albuterol (PROVENTIL) (2.5 MG/3ML) 0.083% nebulizer solution Take 2.5 mg by nebulization every 6 (six) hours as needed for wheezing or shortness of breath.       amLODipine (NORVASC) 10 MG tablet Take 10 mg by mouth daily.       erythromycin ophthalmic ointment Place into both eyes at bedtime.  3.5 g  0   finasteride (PROSCAR) 5 MG tablet Take 5 mg by mouth daily.       fluticasone (VERAMYST) 27.5 MCG/SPRAY nasal spray Place 2 sprays into the nose 2 (two) times daily.       Fluticasone-Salmeterol (ADVAIR) 100-50 MCG/DOSE AEPB Inhale 1 puff into the lungs 2 (two) times daily.  3 each  3   gabapentin (NEURONTIN) 300 MG capsule Take 1 capsule (300 mg total) by mouth 3 (three) times daily. PATIENT NEEDS OFFICE VISIT FOR ADDITIONAL REFILLS  90 capsule  0   glipiZIDE (GLUCOTROL) 10 MG tablet Take 10 mg by mouth 2 (two) times daily before a meal.       hydrochlorothiazide (HYDRODIURIL) 25 MG tablet Take 25 mg by mouth daily.       hydroxychloroquine (PLAQUENIL) 200 MG tablet Take by mouth daily.       INSULIN GLARGINE [place] Inject 100 mg into the skin.       ipratropium (ATROVENT) 0.02 % nebulizer solution Take 2.5 mLs (500 mcg total) by nebulization 4 (four) times daily.  75 mL  12    ipratropium (ATROVENT) 0.03 % nasal spray Place 2 sprays into the nose 2 (two) times daily.  30 mL  6   losartan (COZAAR) 100 MG tablet Take 100 mg by mouth daily.       methocarbamol (ROBAXIN) 500 MG tablet Take 1 tablet (500 mg total) by mouth every 6 (six) hours as needed for muscle spasms.  30 tablet  0   montelukast (SINGULAIR) 10 MG tablet Take 1 tablet (10 mg total) by mouth at bedtime.  90 tablet  3   predniSONE (DELTASONE) 20 MG tablet Three tablets daily x 2 days then two tablets daily x 5 days then one tablet daily x 5 days  21 tablet  0   predniSONE (DELTASONE) 5 MG tablet Take 1-2 tablets (5-10 mg total) by mouth daily.  60 tablet  3   Tamsulosin HCl (FLOMAX) 0.4 MG CAPS Take by mouth.       amoxicillin-clavulanate (AUGMENTIN) 875-125 MG per tablet Take 1 tablet by mouth 2 (two) times daily.  20 tablet  0   cetirizine (ZYRTEC) 10 MG tablet Take 1 tablet (10 mg total) by mouth daily.  30 tablet  11   doxycycline (VIBRAMYCIN) 100 MG capsule Take 1 capsule (100 mg total) by mouth 2 (two) times daily.  20 capsule  0   furosemide (LASIX) 20 MG tablet Take 1 tablet (20 mg total) by mouth daily as needed.  30 tablet  3   guaiFENesin-codeine (ROBITUSSIN AC) 100-10 MG/5ML syrup Take 5 mLs by mouth 4 (four) times daily as needed for cough. (sugar free)  480 mL  0   No current facility-administered medications for this visit.       Objective:    BP 142/78   Pulse 84   Temp(Src) 98.1 F (36.7 C) (Oral)   Resp 18   Ht 5\' 8"  (1.727 m)   Wt 224 lb 9.6 oz (101.878 kg)   BMI 34.16 kg/m2   SpO2 97% Physical Exam  Nursing note and vitals reviewed. Constitutional: He is oriented to person, place, and time. He appears well-developed and well-nourished. No distress.  HENT:  Head: Normocephalic and atraumatic.  Right Ear: Hearing, tympanic membrane, external ear and ear canal normal.  Left Ear: Hearing, tympanic membrane, external ear and ear canal normal.  Nose: No rhinorrhea.    Mouth/Throat: Uvula is midline, oropharynx is clear and moist and mucous membranes are normal.  Healing eschar bilateral nares.   Eyes: Conjunctivae and EOM are normal. No scleral icterus.  Neck: Normal range of motion. Neck supple. No thyromegaly present.  Cardiovascular: Normal rate, regular rhythm and normal heart sounds.   No murmur heard. Pulmonary/Chest: Effort normal. No respiratory distress. He has no decreased breath sounds. He has wheezes (  scant expiratory wheezes). He has no rhonchi. He has no rales. He exhibits no tenderness.  Musculoskeletal: Normal range of motion. He exhibits edema.  1+ bilateral pitting edema bilateral LE mid calf   Lymphadenopathy:    He has no cervical adenopathy.  Neurological: He is alert and oriented to person, place, and time.  Skin: Skin is warm and dry.  Psychiatric: He has a normal mood and affect. His behavior is normal.   Results for orders placed in visit on 07/27/13  POCT CBC      Result Value Ref Range   WBC 11.7 (*) 4.6 - 10.2 K/uL   Lymph, poc 1.2  0.6 - 3.4   POC LYMPH PERCENT 10.4  10 - 50 %L   MID (cbc) 0.5  0 - 0.9   POC MID % 4.0  0 - 12 %M   POC Granulocyte 10.0 (*) 2 - 6.9   Granulocyte percent 85.6 (*) 37 - 80 %G   RBC 4.73  4.69 - 6.13 M/uL   Hemoglobin 13.9 (*) 14.1 - 18.1 g/dL   HCT, POC 44.4  43.5 - 53.7 %   MCV 93.9  80 - 97 fL   MCH, POC 29.4  27 - 31.2 pg   MCHC 31.3 (*) 31.8 - 35.4 g/dL   RDW, POC 16.3     Platelet Count, POC 241  142 - 424 K/uL   MPV 8.1  0 - 99.8 fL  POCT URINALYSIS DIPSTICK      Result Value Ref Range   Color, UA yellow     Clarity, UA clear     Glucose, UA neg     Bilirubin, UA neg     Ketones, UA neg     Spec Grav, UA 1.015     Blood, UA trace-lysed     pH, UA 7.0     Protein, UA 100     Urobilinogen, UA 0.2     Nitrite, UA neg     Leukocytes, UA Trace      EKG: NSR; NO PVCS OR ARRYTHMIA.  PEAK FLOWS: 450, 450, 450    Assessment & Plan:  Leg swelling - Plan: furosemide  (LASIX) 20 MG tablet, EKG 12-Lead, Comprehensive metabolic panel, POCT CBC, POCT urinalysis dipstick  Asthma with acute exacerbation  Allergic rhinitis, cause unspecified  PVC (premature ventricular contraction)  Acute maxillary sinusitis   1.  Acute maxillary sinusitis:  Improving; s/p Augmentin; rx for Doxycycline provided.  Continue nasal spray. 2.  Asthma exacerbation: improved; rx for Advair and Singulair provided to fill through [place]. 3.  Allergic Rhinitis: recent worsening; continue nasal spray and Singulair; rx for Zyrtec provided.  4.  PVCs: New on exam; NSR on EKG. 5.  Leg swelling: chronic with recent worsening.  Rx for Furosemide 20mg  daily PRN provided; obtain CMET.  EKG stable.  Meds ordered this encounter  Medications   furosemide (LASIX) 20 MG tablet    Sig: Take 1 tablet (20 mg total) by mouth daily as needed.    Dispense:  30 tablet    Refill:  3   doxycycline (VIBRAMYCIN) 100 MG capsule    Sig: Take 1 capsule (100 mg total) by mouth 2 (two) times daily.    Dispense:  20 capsule    Refill:  0   Fluticasone-Salmeterol (ADVAIR) 100-50 MCG/DOSE AEPB    Sig: Inhale 1 puff into the lungs 2 (two) times daily.    Dispense:  3 each    Refill:  3  montelukast (SINGULAIR) 10 MG tablet    Sig: Take 1 tablet (10 mg total) by mouth at bedtime.    Dispense:  90 tablet    Refill:  3   cetirizine (ZYRTEC) 10 MG tablet    Sig: Take 1 tablet (10 mg total) by mouth daily.    Dispense:  30 tablet    Refill:  11    No Follow-up on file.    I personally performed the services described in this documentation, which was scribed in my presence.  The recorded information has been reviewed and is accurate.  [name], M.D.  [facility] [address] [place], [place]  [address] [phone] phone [phone] fax

## 2013-07-28 LAB — COMPREHENSIVE METABOLIC PANEL
ALT: 28 U/L (ref 0–53)
AST: 14 U/L (ref 0–37)
Albumin: 3.6 g/dL (ref 3.5–5.2)
Alkaline Phosphatase: 99 U/L (ref 39–117)
BUN: 24 mg/dL — ABNORMAL HIGH (ref 6–23)
CO2: 33 mEq/L — ABNORMAL HIGH (ref 19–32)
Calcium: 9.5 mg/dL (ref 8.4–10.5)
Chloride: 96 mEq/L (ref 96–112)
Creat: 1.19 mg/dL (ref 0.50–1.35)
Glucose, Bld: 138 mg/dL — ABNORMAL HIGH (ref 70–99)
Potassium: 4.5 mEq/L (ref 3.5–5.3)
Sodium: 137 mEq/L (ref 135–145)
Total Bilirubin: 0.7 mg/dL (ref 0.2–1.2)
Total Protein: 6.6 g/dL (ref 6.0–8.3)

## 2013-09-09 ENCOUNTER — Ambulatory Visit (INDEPENDENT_AMBULATORY_CARE_PROVIDER_SITE_OTHER): Payer: Medicare Other | Admitting: Family Medicine

## 2013-09-09 VITALS — BP 112/74 | HR 59 | Temp 98.0°F | Resp 24 | Ht 69.0 in | Wt 225.1 lb

## 2013-09-09 DIAGNOSIS — M353 Polymyalgia rheumatica: Secondary | ICD-10-CM | POA: Insufficient documentation

## 2013-09-09 DIAGNOSIS — J45901 Unspecified asthma with (acute) exacerbation: Secondary | ICD-10-CM

## 2013-09-09 DIAGNOSIS — E119 Type 2 diabetes mellitus without complications: Secondary | ICD-10-CM

## 2013-09-09 DIAGNOSIS — R109 Unspecified abdominal pain: Secondary | ICD-10-CM

## 2013-09-09 DIAGNOSIS — J3489 Other specified disorders of nose and nasal sinuses: Secondary | ICD-10-CM

## 2013-09-09 DIAGNOSIS — J4531 Mild persistent asthma with (acute) exacerbation: Secondary | ICD-10-CM

## 2013-09-09 DIAGNOSIS — J0100 Acute maxillary sinusitis, unspecified: Secondary | ICD-10-CM

## 2013-09-09 DIAGNOSIS — J0101 Acute recurrent maxillary sinusitis: Secondary | ICD-10-CM

## 2013-09-09 DIAGNOSIS — J340 Abscess, furuncle and carbuncle of nose: Secondary | ICD-10-CM

## 2013-09-09 HISTORY — DX: Polymyalgia rheumatica: M35.3

## 2013-09-09 LAB — POCT UA - MICROSCOPIC ONLY
Bacteria, U Microscopic: NEGATIVE
Casts, Ur, LPF, POC: NEGATIVE
Crystals, Ur, HPF, POC: NEGATIVE
Epithelial cells, urine per micros: NEGATIVE
Mucus, UA: NEGATIVE
RBC, urine, microscopic: NEGATIVE
WBC, Ur, HPF, POC: NEGATIVE
Yeast, UA: NEGATIVE

## 2013-09-09 LAB — CBC WITH DIFFERENTIAL/PLATELET
Basophils Absolute: 0 10*3/uL (ref 0.0–0.1)
Basophils Relative: 0 % (ref 0–1)
Eosinophils Absolute: 0.2 10*3/uL (ref 0.0–0.7)
Eosinophils Relative: 2 % (ref 0–5)
HCT: 40.1 % (ref 39.0–52.0)
Hemoglobin: 13.6 g/dL (ref 13.0–17.0)
Lymphocytes Relative: 13 % (ref 12–46)
Lymphs Abs: 1.6 10*3/uL (ref 0.7–4.0)
MCH: 29.3 pg (ref 26.0–34.0)
MCHC: 33.9 g/dL (ref 30.0–36.0)
MCV: 86.4 fL (ref 78.0–100.0)
Monocytes Absolute: 1 10*3/uL (ref 0.1–1.0)
Monocytes Relative: 8 % (ref 3–12)
Neutro Abs: 9.3 10*3/uL — ABNORMAL HIGH (ref 1.7–7.7)
Neutrophils Relative %: 77 % (ref 43–77)
Platelets: 270 10*3/uL (ref 150–400)
RBC: 4.64 MIL/uL (ref 4.22–5.81)
RDW: 16.4 % — ABNORMAL HIGH (ref 11.5–15.5)
WBC: 12.1 10*3/uL — ABNORMAL HIGH (ref 4.0–10.5)

## 2013-09-09 LAB — COMPREHENSIVE METABOLIC PANEL
ALT: 16 U/L (ref 0–53)
AST: 15 U/L (ref 0–37)
Albumin: 3.7 g/dL (ref 3.5–5.2)
Alkaline Phosphatase: 88 U/L (ref 39–117)
BUN: 20 mg/dL (ref 6–23)
CO2: 32 mEq/L (ref 19–32)
Calcium: 9.4 mg/dL (ref 8.4–10.5)
Chloride: 99 mEq/L (ref 96–112)
Creat: 1.33 mg/dL (ref 0.50–1.35)
Glucose, Bld: 187 mg/dL — ABNORMAL HIGH (ref 70–99)
Potassium: 4.4 mEq/L (ref 3.5–5.3)
Sodium: 141 mEq/L (ref 135–145)
Total Bilirubin: 0.6 mg/dL (ref 0.2–1.2)
Total Protein: 6.8 g/dL (ref 6.0–8.3)

## 2013-09-09 LAB — POCT URINALYSIS DIPSTICK
Bilirubin, UA: NEGATIVE
Blood, UA: NEGATIVE
Glucose, UA: NEGATIVE
Ketones, UA: NEGATIVE
Leukocytes, UA: NEGATIVE
Nitrite, UA: NEGATIVE
Protein, UA: >=300
Spec Grav, UA: 1.015
Urobilinogen, UA: 0.2
pH, UA: 6

## 2013-09-09 MED ORDER — AMOXICILLIN-POT CLAVULANATE 875-125 MG PO TABS: 1.0000 | ORAL_TABLET | Freq: Two times a day (BID) | ORAL | Status: DC

## 2013-09-09 NOTE — Patient Instructions (Signed)
Call in ten days if sinus pressure/pain is not improved with Augmentin therapy.  Sinusitis Sinusitis is redness, soreness, and swelling (inflammation) of the paranasal sinuses. Paranasal sinuses are air pockets within the bones of your face (beneath the eyes, the middle of the forehead, or above the eyes). In healthy paranasal sinuses, mucus is able to drain out, and air is able to circulate through them by way of your nose. However, when your paranasal sinuses are inflamed, mucus and air can become trapped. This can allow bacteria and other germs to grow and cause infection. Sinusitis can develop quickly and last only a short time (acute) or continue over a long period (chronic). Sinusitis that lasts for more than 12 weeks is considered chronic.  CAUSES  Causes of sinusitis include:  Allergies.  Structural abnormalities, such as displacement of the cartilage that separates your nostrils (deviated septum), which can decrease the air flow through your nose and sinuses and affect sinus drainage.  Functional abnormalities, such as when the small hairs (cilia) that line your sinuses and help remove mucus do not work properly or are not present. SYMPTOMS  Symptoms of acute and chronic sinusitis are the same. The primary symptoms are pain and pressure around the affected sinuses. Other symptoms include:  Upper toothache.  Earache.  Headache.  Bad breath.  Decreased sense of smell and taste.  A cough, which worsens when you are lying flat.  Fatigue.  Fever.  Thick drainage from your nose, which often is green and may contain pus (purulent).  Swelling and warmth over the affected sinuses. DIAGNOSIS  Your caregiver will perform a physical exam. During the exam, your caregiver may:  Look in your nose for signs of abnormal growths in your nostrils (nasal polyps).  Tap over the affected sinus to check for signs of infection.  View the inside of your sinuses (endoscopy) with a special  imaging device with a light attached (endoscope), which is inserted into your sinuses. If your caregiver suspects that you have chronic sinusitis, one or more of the following tests may be recommended:  Allergy tests.  Nasal culture--A sample of mucus is taken from your nose and sent to a lab and screened for bacteria.  Nasal cytology--A sample of mucus is taken from your nose and examined by your caregiver to determine if your sinusitis is related to an allergy. TREATMENT  Most cases of acute sinusitis are related to a viral infection and will resolve on their own within 10 days. Sometimes medicines are prescribed to help relieve symptoms (pain medicine, decongestants, nasal steroid sprays, or saline sprays).  However, for sinusitis related to a bacterial infection, your caregiver will prescribe antibiotic medicines. These are medicines that will help kill the bacteria causing the infection.  Rarely, sinusitis is caused by a fungal infection. In theses cases, your caregiver will prescribe antifungal medicine. For some cases of chronic sinusitis, surgery is needed. Generally, these are cases in which sinusitis recurs more than 3 times per year, despite other treatments. HOME CARE INSTRUCTIONS   Drink plenty of water. Water helps thin the mucus so your sinuses can drain more easily.  Use a humidifier.  Inhale steam 3 to 4 times a day (for example, sit in the bathroom with the shower running).  Apply a warm, moist washcloth to your face 3 to 4 times a day, or as directed by your caregiver.  Use saline nasal sprays to help moisten and clean your sinuses.  Take over-the-counter or prescription medicines for pain,  discomfort, or fever only as directed by your caregiver. SEEK IMMEDIATE MEDICAL CARE IF:  You have increasing pain or severe headaches.  You have nausea, vomiting, or drowsiness.  You have swelling around your face.  You have vision problems.  You have a stiff neck.  You  have difficulty breathing. MAKE SURE YOU:   Understand these instructions.  Will watch your condition.  Will get help right away if you are not doing well or get worse. Document Released: 03/02/2005 Document Revised: 05/25/2011 Document Reviewed: 03/17/2011 [facility] Patient Information 2015 [place], [place]. This information is not intended to replace advice given to you by your health care provider. Make sure you discuss any questions you have with your health care provider.

## 2013-09-09 NOTE — Progress Notes (Signed)
Subjective:  This chart was scribed for Jacob Forts, MD by Donato Schultz, Medical Scribe. This patient was seen in Room 3 and the patient's care was started at 11:51 AM.   Patient ID: Jacob [name], male    DOB: 1940/06/05, 73 y.o.   MRN: [id number]  HPI HPI Comments Jacob [name] is a 73 y.o. male with a history of DM and COPD and PMR who presents to the [facility] complaining of constant sinusitis that started 6 days ago.  He states that when he woke up last Sunday morning, his lower lip was covered in fever blisters and the next morning his eyes had a lot of crust in them and his nose was very congested; he was also suffering with abdominal pain; he has suffered with frequent loose stools since onset.    The patient was seen on 5/14 by Dr. Tamala Julian for maxillary sinusitis and was treated with Doxycycline and was prescribed Augmentin on 07/12/13.  He states that the symptoms he is experiencing now are different than those he experienced the last time he came to [facility].  He states that his mucous is tinged with blood when he blows his nose.  The patient states that he has taken Prednisone, Mucinex, and two different nasal spray twice a day with no relief to his symptoms.  He states that his has increased his Prednisone dosage from 8mg  to 10mg .  The patient states that he uses Mupirocin two or three times daily for a persistent ulcer in his L nare x 2 years; Dr. Everlene Farrier prescribed the [place]; he restarted Bactroban this week.  He states that he takes 2 puffs of Symbacort twice a day.  He states that he has not had a breathing treatment today but normally does them 2-4 times a day.  He denies fever, wheezing, chills, diaphoresis, myalgias, sore throat, vomiting, ear pain, nocturia, polyuria, hematuria, dysuria, and rhinorrhea as associated symptoms.  He lists mild cough, mild abdominal pain, diarrhea, and nausea as associated symptoms.  He states that his sugars were 143 this morning.        The patient states that the swelling in his legs has decreased since he was prescribed a fluid pill at his last visit.  He took Lasix x 5 days with resolution of swelling; no current use of Lasix in past month.  Past Medical History  Diagnosis Date  . Allergy   . Asthma   . Diabetes mellitus without complication   . Cataract   . Arthritis   . Polymyalgia rheumatica     maintained on Prednisone, Plaquenil.  Marland Kitchen COPD (chronic obstructive pulmonary disease)    Past Surgical History  Procedure Laterality Date  . Appendectomy    . Hernia repair    . Prostate surgery    . Coronary artery bypass graft     History reviewed. No pertinent family history. History   Social History  . Marital Status: Married    Spouse Name: N/A    Number of Children: N/A  . Years of Education: N/A   Occupational History  . Not on file.   Social History Main Topics  . Smoking status: Former Research scientist (life sciences)  . Smokeless tobacco: Never Used  . Alcohol Use: No  . Drug Use: No  . Sexual Activity: Yes    Birth Control/ Protection: None   Other Topics Concern  . Not on file   Social History Narrative  . No narrative on file  Allergies  Allergen Reactions  . Ace Inhibitors   . Ciprocinonide [Fluocinolone]   . Flunisolide   . Metformin And Related   . Sertraline   . Sulindac   . Terazosin     Review of Systems  Constitutional: Negative for fever, chills, diaphoresis and fatigue.  HENT: Positive for congestion, postnasal drip and sinus pressure. Negative for ear pain, facial swelling, nosebleeds, rhinorrhea, sore throat, trouble swallowing and voice change.   Respiratory: Positive for cough and wheezing. Negative for shortness of breath.   Cardiovascular: Negative for leg swelling.  Gastrointestinal: Positive for nausea and abdominal pain. Negative for vomiting, diarrhea, constipation, blood in stool and anal bleeding.  Endocrine: Negative for polyuria.  Genitourinary: Negative for dysuria,  urgency, frequency, hematuria, enuresis and difficulty urinating.  Musculoskeletal: Negative for myalgias.  Skin: Negative for rash.  Neurological: Positive for headaches. Negative for dizziness and light-headedness.  All other systems reviewed and are negative.    Objective:  Physical Exam  Nursing note and vitals reviewed. Constitutional: He is oriented to person, place, and time. He appears well-developed and well-nourished. No distress.  HENT:  Head: Normocephalic and atraumatic.  Right Ear: Hearing, tympanic membrane, external ear and ear canal normal.  Left Ear: Hearing, tympanic membrane, external ear and ear canal normal.  Nose: Rhinorrhea present. Right sinus exhibits maxillary sinus tenderness. Right sinus exhibits no frontal sinus tenderness. Left sinus exhibits maxillary sinus tenderness. Left sinus exhibits no frontal sinus tenderness.  Mouth/Throat: Oropharynx is clear and moist. No oropharyngeal exudate, posterior oropharyngeal edema, posterior oropharyngeal erythema or tonsillar abscesses.  Ulcer on the septal aspect of the left nare with bloody residual drainage.   Eyes: EOM are normal.  Neck: Normal range of motion. Neck supple.  Cardiovascular: Normal rate, regular rhythm, normal heart sounds and intact distal pulses.  Exam reveals no gallop and no friction rub.   No murmur heard. Pulmonary/Chest: Effort normal. No respiratory distress. He has wheezes in the right upper field, the right middle field, the right lower field, the left upper field, the left middle field and the left lower field. He has no rales.  Scattered expiratory wheezes throughout.   Abdominal: Soft. He exhibits no distension and no mass. There is tenderness in the epigastric area and suprapubic area. There is no rebound and no guarding.  Mild bilateral lower suprapubic and epigastric tenderness.   Musculoskeletal: Normal range of motion. He exhibits no edema.  No lower extremity swelling.   Lymphadenopathy:    He has no cervical adenopathy.  Neurological: He is alert and oriented to person, place, and time.  Skin: Skin is warm and dry. He is not diaphoretic.  Psychiatric: He has a normal mood and affect. His behavior is normal.   Results for orders placed in visit on 09/09/13  POCT URINALYSIS DIPSTICK      Result Value Ref Range   Color, UA yellow     Clarity, UA clear     Glucose, UA neg     Bilirubin, UA neg     Ketones, UA neg     Spec Grav, UA 1.015     Blood, UA neg     pH, UA 6.0     Protein, UA >=300     Urobilinogen, UA 0.2     Nitrite, UA neg     Leukocytes, UA Negative    POCT UA - MICROSCOPIC ONLY      Result Value Ref Range   WBC, Ur, HPF, POC neg  RBC, urine, microscopic neg     Bacteria, U Microscopic neg     Mucus, UA neg     Epithelial cells, urine per micros neg     Crystals, Ur, HPF, POC neg     Casts, Ur, LPF, POC neg     Yeast, UA neg       BP 112/74  Pulse 59  Temp(Src) 98 F (36.7 C) (Oral)  Resp 24  Ht 5\' 9"  (1.753 m)  Wt 225 lb 2 oz (102.116 kg)  BMI 33.23 kg/m2  SpO2 96% Assessment & Plan:  Acute recurrent maxillary sinusitis - Plan: CBC with Differential  Nasal septum ulceration  Type II or unspecified type diabetes mellitus without mention of complication, not stated as uncontrolled  Abdominal pain, unspecified site - Plan: CBC with Differential, POCT urinalysis dipstick, POCT UA - Microscopic Only, Comprehensive metabolic panel  PMR (polymyalgia rheumatica)  Asthma with acute exacerbation, mild persistent  1. Acute recurrent maxillary sinusitis:  New. Rx for Augmentin provided; continue nasal sprays, Mucinex, Zyrtec daily. If no improvement with Augmentin, call for rx for Doxycycline. 2.  Nasal septal ulcer:  New.  L sided; continue Bactroban x 5 days; rx for Augmentin; Doxycycline may be beneficial for MRSA coverage. If persists more than two weeks, recommend E[facility] evaluation at [facility]. 3.  Abdominal pain:  New.  Associated with frequent loose stools.  Obtain CBC, CMET; u/a normal. 4. PMR: worsening this week with increase in daily Prednisone dose from 8mg  to 10mg . 5.  Asthma exacerbation:  New; mild; continue qid Albuterol nebulizer; continue Symbicort.  Meds ordered this encounter  Medications  . amoxicillin-clavulanate (AUGMENTIN) 875-125 MG per tablet    Sig: Take 1 tablet by mouth 2 (two) times daily.    Dispense:  20 tablet    Refill:  0   I personally performed the services described in this documentation, which was scribed in my presence.  The recorded information has been reviewed and is accurate.  Jacob [name], M.D.  [facility] [address] [place], [place]  [address] [phone] phone [phone] fax

## 2013-09-15 ENCOUNTER — Other Ambulatory Visit: Payer: Self-pay | Admitting: Family Medicine

## 2013-11-22 ENCOUNTER — Telehealth: Payer: Self-pay | Admitting: *Deleted

## 2013-11-22 NOTE — Telephone Encounter (Signed)
Phoned to schedule appt for Annual Medicare Wellness Exam & spoke with spouse.  Stated patient doesn't see Dr. Debbe Mounts any longer (saw him at the [place]), but that patient would need to call back/either me call him back at a later time to schedule with Dr. Tamala Julian.  Composing and sending letter to patient

## 2013-11-29 ENCOUNTER — Ambulatory Visit (INDEPENDENT_AMBULATORY_CARE_PROVIDER_SITE_OTHER): Payer: Medicare Other | Admitting: Emergency Medicine

## 2013-11-29 VITALS — BP 146/86 | HR 61 | Temp 97.3°F | Resp 18 | Ht 70.0 in | Wt 230.2 lb

## 2013-11-29 DIAGNOSIS — J0100 Acute maxillary sinusitis, unspecified: Secondary | ICD-10-CM

## 2013-11-29 DIAGNOSIS — J0101 Acute recurrent maxillary sinusitis: Secondary | ICD-10-CM

## 2013-11-29 DIAGNOSIS — R42 Dizziness and giddiness: Secondary | ICD-10-CM

## 2013-11-29 MED ORDER — AMOXICILLIN-POT CLAVULANATE 875-125 MG PO TABS: 1.0000 | ORAL_TABLET | Freq: Two times a day (BID) | ORAL | Status: DC

## 2013-11-29 NOTE — Patient Instructions (Signed)

## 2013-11-29 NOTE — Progress Notes (Signed)
   Subjective:    Patient ID: Jacob [name], male    DOB: 05/20/1940, 73 y.o.   MRN: [name]  HPI 73 year old male here sinus congestion and dizziness. Pt has had the sinus congestion for about a week. Has hx of sinus infections. Has a lot of post nasal drainage. The dizziness has been going on around a month. Minor cough. Mild sore throat. No otalgia. Pt feels he "cannot walk 50 ft without becoming dizzy." Pt says the dizziness has become worse. When he gets dizzy he feels like he is going to pass out. Pt has not passed out from the dizziness. The dizziness always happens while he is being active. Pt feels a "flutter" in his chest. No chest pain. No SOB. No pain in jaw. Dr. Wynonia Lawman is his cardiologist. Pt has not seen his cardiologist in years.      Review of Systems  HENT: Positive for congestion, postnasal drip, rhinorrhea, sinus pressure and sore throat. Negative for ear discharge and ear pain.   Respiratory: Positive for cough. Negative for shortness of breath and wheezing.   Cardiovascular: Negative for chest pain.  Neurological: Positive for dizziness.       Objective:   Physical Exam  Constitutional: He appears well-developed and well-nourished.  HENT:  Right Ear: External ear normal.  Left Ear: External ear normal.  There is significant nasal congestion and puffiness of the nasal sinuses  Eyes: Pupils are equal, round, and reactive to light.  Neck: No thyromegaly present.  Cardiovascular: Normal rate and regular rhythm.  Exam reveals no gallop and no friction rub.   No murmur heard. Pulmonary/Chest: Effort normal and breath sounds normal. He has no wheezes. He has no rales.  Abdominal: Soft. He exhibits no distension. There is no tenderness.    EKG normal sinus rhythm no acute changes      Assessment & Plan:  Would treat sinusitis with Augmentin.  EKG is normal. Referral made to Dr. Wynonia Lawman to be sure he does not have an arrhythmia causing his dizziness with ambulation.  He does not have any true vertiginous symptoms.

## 2014-01-09 ENCOUNTER — Encounter: Payer: Self-pay | Admitting: Family Medicine

## 2014-01-16 ENCOUNTER — Telehealth: Payer: Self-pay

## 2014-01-16 NOTE — Telephone Encounter (Signed)
Spoke to [place]- she is going to have pt come in to have labs drawn first thing in the morning. He will then follow his usual routine with his breakfast/coffee and insulin. Advised her he can bring his breakfast bar with him to have after his blood work is drawn.

## 2014-01-16 NOTE — Telephone Encounter (Signed)
[name] (VIVIAN) WANTS SOMEONE TO CALL HER REGARDING HER HUSBAND'S APPOINTMENT ON NOV. 11, 2015 WITH DR. Tamala Julian. HIS APPOINTMENT IS AT 2:15 THAT AFTERNOON. WILL HE BE ABLE TO TAKE HIS DIABETIC MEDICATION AND GET HIS SHOT THAT MORNING BEFORE HE COMES IN? BEST PHONE [phone] ([place])  [place]

## 2014-01-24 ENCOUNTER — Encounter: Payer: Self-pay | Admitting: Family Medicine

## 2014-01-24 ENCOUNTER — Ambulatory Visit (INDEPENDENT_AMBULATORY_CARE_PROVIDER_SITE_OTHER): Payer: Medicare Other | Admitting: Family Medicine

## 2014-01-24 VITALS — BP 154/70 | HR 73 | Temp 97.8°F | Resp 16 | Ht 70.0 in | Wt 231.0 lb

## 2014-01-24 DIAGNOSIS — E7800 Pure hypercholesterolemia, unspecified: Secondary | ICD-10-CM

## 2014-01-24 DIAGNOSIS — Z1211 Encounter for screening for malignant neoplasm of colon: Secondary | ICD-10-CM

## 2014-01-24 DIAGNOSIS — E1121 Type 2 diabetes mellitus with diabetic nephropathy: Secondary | ICD-10-CM

## 2014-01-24 DIAGNOSIS — I10 Essential (primary) hypertension: Secondary | ICD-10-CM

## 2014-01-24 DIAGNOSIS — J4540 Moderate persistent asthma, uncomplicated: Secondary | ICD-10-CM

## 2014-01-24 DIAGNOSIS — Z0000 Encounter for general adult medical examination without abnormal findings: Secondary | ICD-10-CM

## 2014-01-24 DIAGNOSIS — Z026 Encounter for examination for insurance purposes: Secondary | ICD-10-CM

## 2014-01-24 DIAGNOSIS — I25810 Atherosclerosis of coronary artery bypass graft(s) without angina pectoris: Secondary | ICD-10-CM

## 2014-01-24 DIAGNOSIS — Z23 Encounter for immunization: Secondary | ICD-10-CM

## 2014-01-24 DIAGNOSIS — I5030 Unspecified diastolic (congestive) heart failure: Secondary | ICD-10-CM

## 2014-01-24 DIAGNOSIS — J0101 Acute recurrent maxillary sinusitis: Secondary | ICD-10-CM

## 2014-01-24 DIAGNOSIS — Z125 Encounter for screening for malignant neoplasm of prostate: Secondary | ICD-10-CM

## 2014-01-24 LAB — LIPID PANEL
Cholesterol: 207 mg/dL — ABNORMAL HIGH (ref 0–200)
HDL: 51 mg/dL (ref >39)
LDL Cholesterol: 133 mg/dL — ABNORMAL HIGH (ref 0–99)
Total CHOL/HDL Ratio: 4.1 Ratio
Triglycerides: 114 mg/dL (ref <150)
VLDL: 23 mg/dL (ref 0–40)

## 2014-01-24 LAB — COMPLETE METABOLIC PANEL WITH GFR
ALT: 18 U/L (ref 0–53)
AST: 15 U/L (ref 0–37)
Albumin: 3.4 g/dL — ABNORMAL LOW (ref 3.5–5.2)
Alkaline Phosphatase: 74 U/L (ref 39–117)
BUN: 21 mg/dL (ref 6–23)
CO2: 30 mEq/L (ref 19–32)
Calcium: 8.9 mg/dL (ref 8.4–10.5)
Chloride: 103 mEq/L (ref 96–112)
Creat: 1.36 mg/dL — ABNORMAL HIGH (ref 0.50–1.35)
GFR, Est African American: 59 mL/min — ABNORMAL LOW
GFR, Est Non African American: 51 mL/min — ABNORMAL LOW
Glucose, Bld: 112 mg/dL — ABNORMAL HIGH (ref 70–99)
Potassium: 4.3 mEq/L (ref 3.5–5.3)
Sodium: 143 mEq/L (ref 135–145)
Total Bilirubin: 0.8 mg/dL (ref 0.2–1.2)
Total Protein: 6.1 g/dL (ref 6.0–8.3)

## 2014-01-24 LAB — CBC WITH DIFFERENTIAL/PLATELET
Basophils Absolute: 0 10*3/uL (ref 0.0–0.1)
Basophils Relative: 0 % (ref 0–1)
Eosinophils Absolute: 0.3 10*3/uL (ref 0.0–0.7)
Eosinophils Relative: 3 % (ref 0–5)
HCT: 40.1 % (ref 39.0–52.0)
Hemoglobin: 13.6 g/dL (ref 13.0–17.0)
Lymphocytes Relative: 25 % (ref 12–46)
Lymphs Abs: 2.1 10*3/uL (ref 0.7–4.0)
MCH: 30 pg (ref 26.0–34.0)
MCHC: 33.9 g/dL (ref 30.0–36.0)
MCV: 88.3 fL (ref 78.0–100.0)
Monocytes Absolute: 0.9 10*3/uL (ref 0.1–1.0)
Monocytes Relative: 11 % (ref 3–12)
Neutro Abs: 5.2 10*3/uL (ref 1.7–7.7)
Neutrophils Relative %: 61 % (ref 43–77)
Platelets: 242 10*3/uL (ref 150–400)
RBC: 4.54 MIL/uL (ref 4.22–5.81)
RDW: 16.7 % — ABNORMAL HIGH (ref 11.5–15.5)
WBC: 8.5 10*3/uL (ref 4.0–10.5)

## 2014-01-24 MED ORDER — AMOXICILLIN-POT CLAVULANATE 875-125 MG PO TABS: 1.0000 | ORAL_TABLET | Freq: Two times a day (BID) | ORAL | Status: DC

## 2014-01-24 MED ORDER — GUAIFENESIN-CODEINE 100-10 MG/5ML PO SYRP: 5.0000 mL | ORAL_SOLUTION | Freq: Three times a day (TID) | ORAL | Status: DC | PRN

## 2014-01-24 NOTE — Progress Notes (Signed)
Subjective:    Patient ID: Jacob [name], male    DOB: 03/05/1941, 73 y.o.   MRN: [name]  01/24/2014  Annual Exam; Asthma; Diabetes; and Hypertension   HPI This 73 y.o. male presents for Annual Wellness Exam and Complete Physical Examination and follow-up of chronic medical conditions.   Last physical:    Scheduled tomorrow at [facility] in [place]. Colonoscopy:  7 or 8 years ago; then scheduled last year again but physician cancelled procedure and pt never rescheduled. TDAP:  2015 Pneumovax:  7 or 8 years ago.      [facility]avax:  [facility] won't [facility].   Influenza:  12/14/2013 Eye exam:  2015; Dr. Idolina Primer; +glasses.  +cataracts. Dental exam:  Dentures.  DMII: well controlled; most recent HgbA1c at [place] three months ago; 6.8?  Patient reports good compliance with medication, good tolerance to medication, and good symptom control.    BPH: followed by urology at [facility]; s/p evaluation every six months.  S/p TURP in past.    Asthma:  Major issue for patient.  S/p pulmonology evaluation three years ago; diagnosed with COPD and asthma.   Previously prescribed Spiriva.  Kept getting worse on inhalers.  Went back to [place] on a Saturday; saw a physician.  Started on a pill for asthma.  Went back to see lung specialist who felt was better.  Improved for past year.  Then started declining again and presented to ED.  Has been using nebulizer with Duoneb bid instead of tid or qid.  Compliance with Symbicort.  No previous sleep study.  NO URINARY LEAKAGE; nocturia x 3.  +weak urinary stream.  Sinus congestion:  Onset two weeks ago.  +facial pain B.  +rhinorrhea; +nasal congestion.  No ST or ear pain. +cough; +wheezing at times.    HTN: Patient reports good compliance with medication, good tolerance to medication, and good symptom control.   Denies CP/palptations; +SOB; +leg swelling that is becoming worse.  Denies orthopnea.    Polymyalgia rheumatica: followed by rheumatology through [facility] every four months.  Maintained  on Prednisone.  DDD lumbar spine: followed by ortho at [facility].  Will not undergo surgery.    CAD: just followed up with Dr. Wynonia Lawman after recent visit with Dr. Everlene Farrier; had arrhythmia during that visit.  Underwent echo, stress testing, Holter monitor.  +echo with EF 50%; moderate LVH; grade 2 diastolic dysfunction.  Advanced Directives: yes; DNR/DNI.  Wife is HCPOA.  Children aware of wishes.   Review of Systems See CMA ROS listed.   Past Medical History  Diagnosis Date  . Allergy   . Asthma   . Diabetes mellitus without complication   . Cataract   . Arthritis   . Polymyalgia rheumatica     maintained on Prednisone, Plaquenil. Followed by rhuematology every 4 months/[name].  Marland Kitchen COPD (chronic obstructive pulmonary disease)   . Anemia   . Hypertension   . BPH (benign prostatic hyperplasia)    Past Surgical History  Procedure Laterality Date  . Appendectomy    . Hernia repair    . Coronary artery bypass graft  03/17/1995    Wynonia Lawman; followed every six months.  . Prostate surgery      TURP at [facility].   Allergies  Allergen Reactions  . Ace Inhibitors   . Ciprocinonide [Fluocinolone]   . Flunisolide   . Metformin And Related   . Sertraline   . Sulindac   . Terazosin    Current Outpatient Prescriptions  Medication Sig Dispense Refill  .  albuterol (PROVENTIL HFA;VENTOLIN HFA) 108 (90 BASE) MCG/ACT inhaler Inhale 2 puffs into the lungs every 6 (six) hours as needed for wheezing or shortness of breath. 1 Inhaler 2  . albuterol (PROVENTIL) (2.5 MG/3ML) 0.083% nebulizer solution Take 2.5 mg by nebulization every 6 (six) hours as needed for wheezing or shortness of breath.    Marland Kitchen amLODipine (NORVASC) 10 MG tablet Take 10 mg by mouth daily.    . cetirizine (ZYRTEC) 10 MG tablet Take 1 tablet (10 mg total) by mouth daily. 30 tablet 11  . Cholecalciferol 1000 UNITS capsule Take 1,000 Units by mouth daily.    Marland Kitchen erythromycin ophthalmic ointment Place into both eyes at bedtime. 3.5 g 0  . finasteride  (PROSCAR) 5 MG tablet Take 5 mg by mouth daily.    . fluticasone (VERAMYST) 27.5 MCG/SPRAY nasal spray Place 2 sprays into the nose 2 (two) times daily.    . Fluticasone-Salmeterol (ADVAIR) 100-50 MCG/DOSE AEPB Inhale 1 puff into the lungs 2 (two) times daily. 3 each 3  . furosemide (LASIX) 20 MG tablet Take 1 tablet (20 mg total) by mouth daily as needed. 30 tablet 3  . gabapentin (NEURONTIN) 300 MG capsule Take 1 capsule (300 mg total) by mouth 3 (three) times daily. PATIENT NEEDS OFFICE VISIT FOR ADDITIONAL REFILLS 90 capsule 0  . glipiZIDE (GLUCOTROL) 10 MG tablet Take 10 mg by mouth 2 (two) times daily before a meal.    . hydrochlorothiazide (HYDRODIURIL) 25 MG tablet Take 25 mg by mouth daily.    . INSULIN GLARGINE [place] Inject 100 mg into the skin.    Marland Kitchen ipratropium (ATROVENT) 0.02 % nebulizer solution USE 1 AMPULE IN NEBULIZER 4 TIMES A DAY 90 mL 5  . ipratropium (ATROVENT) 0.03 % nasal spray Place 2 sprays into the nose 2 (two) times daily. 30 mL 6  . losartan (COZAAR) 100 MG tablet Take 100 mg by mouth daily.    . montelukast (SINGULAIR) 10 MG tablet Take 1 tablet (10 mg total) by mouth at bedtime. 90 tablet 3  . oxyCODONE-acetaminophen (PERCOCET/ROXICET) 5-325 MG per tablet Take by mouth every 4 (four) hours as needed for severe pain.    . predniSONE (DELTASONE) 20 MG tablet Three tablets daily x 2 days then two tablets daily x 5 days then one tablet daily x 5 days 21 tablet 0  . predniSONE (DELTASONE) 5 MG tablet Take 1-2 tablets (5-10 mg total) by mouth daily. 60 tablet 3  . Tamsulosin HCl (FLOMAX) 0.4 MG CAPS Take by mouth.    Marland Kitchen amoxicillin-clavulanate (AUGMENTIN) 875-125 MG per tablet Take 1 tablet by mouth 2 (two) times daily. 20 tablet 0  . doxycycline (VIBRAMYCIN) 100 MG capsule Take 1 capsule (100 mg total) by mouth 2 (two) times daily. 20 capsule 0  . guaiFENesin-codeine (ROBITUSSIN AC) 100-10 MG/5ML syrup Take 5 mLs by mouth 3 (three) times daily as needed for cough. 473 mL 0    No current facility-administered medications for this visit.       Objective:    BP 154/70 mmHg  Pulse 73  Temp(Src) 97.8 F (36.6 C) (Oral)  Resp 16  Ht 5\' 10"  (1.778 m)  Wt 231 lb (104.781 kg)  BMI 33.15 kg/m2  SpO2 96% Physical Exam  Constitutional: He is oriented to person, place, and time. He appears well-developed and well-nourished. No distress.  obese  HENT:  Head: Normocephalic and atraumatic.  Right Ear: External ear normal.  Left Ear: External ear normal.  Nose: Mucosal edema  and rhinorrhea present. Right sinus exhibits maxillary sinus tenderness and frontal sinus tenderness. Left sinus exhibits maxillary sinus tenderness and frontal sinus tenderness.  Mouth/Throat: Oropharynx is clear and moist.  Eyes: Conjunctivae and EOM are normal. Pupils are equal, round, and reactive to light.  Neck: Normal range of motion. Neck supple. Carotid bruit is not present. No thyromegaly present.  Cardiovascular: Normal rate, regular rhythm, normal heart sounds and intact distal pulses.  Exam reveals no gallop and no friction rub.   No murmur heard. Pulmonary/Chest: Effort normal and breath sounds normal. He has no wheezes. He has no rales.  Abdominal: Soft. Bowel sounds are normal. He exhibits no distension and no mass. There is no tenderness. There is no rebound and no guarding.  Musculoskeletal:       Right shoulder: Normal.       Left shoulder: Normal.       Cervical back: Normal.  Lymphadenopathy:    He has no cervical adenopathy.  Neurological: He is alert and oriented to person, place, and time. He has normal reflexes. No cranial nerve deficit. He exhibits normal muscle tone. Coordination normal.  Skin: Skin is warm and dry. No rash noted. He is not diaphoretic. No erythema. No pallor.  Psychiatric: He has a normal mood and affect. His behavior is normal. Judgment and thought content normal.        Assessment & Plan:   1. Routine general medical examination at a  health care facility   2. Elevated cholesterol   3. Essential hypertension   4. Screening for prostate cancer   5. Need for prophylactic vaccination against Streptococcus pneumoniae (pneumococcus)   6. Type 2 diabetes mellitus with diabetic nephropathy   7. Acute recurrent maxillary sinusitis   8. Extrinsic asthma, moderate persistent, uncomplicated   9. Coronary artery disease involving coronary bypass graft of native heart without angina pectoris   10. CHF with left ventricular diastolic dysfunction, NYHA class 2   11. Screening for colon cancer      1.  Annual Wellness Examination Subsequent and Complete Physical Examination:  Anticipatory guidance --- weight loss, exercise.  Colonoscopy overdue; hemosure cards provided.  Immunizations reviewed; s/p Prevnar-13 in office.  Moderate fall risk due to lower back pain and neuropathy.  No evidence of depression.  Independent with ADLs.  Has advanced directives; DNR/DNI.  Ambulates independently most of the time. 2.  Hyperlipidemia: stable; obtain labs. 3.  HTN: moderately controlled; obtain labs; continue current medications. 4.  DMII with nephropathy and neuropathy: stable; obtain labs with urine microalbumin; obtain most recent diabetic eye exam from Dr. Idolina Primer.   Continue Gabapentin for neuropathy. 5.  Acute maxillary sinusitis: Recurrent; refill of Augmentin and Robitussin AC provided. 6.  Asthma and COPD: moderately to poorly controlled on Symbicort, Singulair, Duoneb bid.  No longer smoking.  S/p pulmonology evaluation three years ago that was not beneficial to patient; consider referral to pulmonology for second opinion.  S/p recent cardiology consultation with negative stress test by report.  Deconditioning may also be contributing to symptoms of DOE. 7.  CAD: stable; s/p follow-up with Dr. Wynonia Lawman in the past month with 2D-echo and stress testing. 8. CHF diastolic: New.  Recent echo with grade 2 diastolic dysfunction which is etiology to leg  swelling; recommend daily Lasix therapy.    Meds ordered this encounter  Medications  . amoxicillin-clavulanate (AUGMENTIN) 875-125 MG per tablet    Sig: Take 1 tablet by mouth 2 (two) times daily.    Dispense:  20 tablet    Refill:  0  . guaiFENesin-codeine (ROBITUSSIN AC) 100-10 MG/5ML syrup    Sig: Take 5 mLs by mouth 3 (three) times daily as needed for cough.    Dispense:  473 mL    Refill:  0    Return in about 6 months (around 07/25/2014) for recheck.    [name], M.D.  [facility] [address] [place],   [id number] [phone] phone [phone] fax

## 2014-01-24 NOTE — Patient Instructions (Signed)
Keeping you healthy  Get these tests  Blood pressure- Have your blood pressure checked once a year by your healthcare provider.  Normal blood pressure is 120/80  Weight- Have your body mass index (BMI) calculated to screen for obesity.  BMI is a measure of body fat based on height and weight. You can also calculate your own BMI at [email or web address].com/bmi/.  Cholesterol- Have your cholesterol checked every year.  Diabetes- Have your blood sugar checked regularly if you have high blood pressure, high cholesterol, have a family history of diabetes or if you are overweight.  Screening for Colon Cancer- Colonoscopy starting at age 50.  Screening may begin sooner depending on your family history and other health conditions. Follow up colonoscopy as directed by your Gastroenterologist.  Screening for Prostate Cancer- Both blood work (PSA) and a rectal exam help screen for Prostate Cancer.  Screening begins at age 40 with African-American men and at age 50 with Caucasian men.  Screening may begin sooner depending on your family history.  Take these medicines  Aspirin- One aspirin daily can help prevent Heart disease and Stroke.  Flu shot- Every fall.  Tetanus- Every 10 years.  Zostavax- Once after the age of 60 to prevent Shingles.  Pneumonia shot- Once after the age of 65; if you are younger than 65, ask your healthcare provider if you need a Pneumonia shot.  Take these steps  Don't smoke- If you do smoke, talk to your doctor about quitting.  For tips on how to quit, go to www.smokefree.gov or call 1-800-QUIT-NOW.  Be physically active- Exercise 5 days a week for at least 30 minutes.  If you are not already physically active start slow and gradually work up to 30 minutes of moderate physical activity.  Examples of moderate activity include walking briskly, mowing the yard, dancing, swimming, bicycling, etc.  Eat a healthy diet- Eat a variety of healthy food such as fruits, vegetables, low  fat milk, low fat cheese, yogurt, lean meant, poultry, fish, beans, tofu, etc. For more information go to www.thenutritionsource.org  Drink alcohol in moderation- Limit alcohol intake to less than two drinks a day. Never drink and drive.  Dentist- Brush and floss twice daily; visit your dentist twice a year.  Depression- Your emotional health is as important as your physical health. If you're feeling down, or losing interest in things you would normally enjoy please talk to your healthcare provider.  Eye exam- Visit your eye doctor every year.  Safe sex- If you may be exposed to a sexually transmitted infection, use a condom.  Seat belts- Seat belts can save your life; always wear one.  Smoke/Carbon Monoxide detectors- These detectors need to be installed on the appropriate level of your home.  Replace batteries at least once a year.  Skin cancer- When out in the sun, cover up and use sunscreen 15 SPF or higher.  Violence- If anyone is threatening you, please tell your healthcare provider.  Living Will/ Health care power of attorney- Speak with your healthcare provider and family. 

## 2014-01-24 NOTE — Progress Notes (Signed)
   Subjective:    Patient ID: Jacob [name], male    DOB: May 03, 1940, 73 y.o.   MRN: [name]  HPI    Review of Systems  Constitutional: Positive for diaphoresis and fatigue.  HENT: Positive for congestion, sinus pressure and sneezing.   Eyes: Positive for itching.  Respiratory: Positive for cough, shortness of breath, wheezing and stridor.   Cardiovascular: Positive for palpitations and leg swelling.  Gastrointestinal: Negative.   Endocrine: Positive for heat intolerance, polydipsia and polyphagia.  Genitourinary: Negative.   Musculoskeletal: Positive for myalgias, back pain, neck pain and neck stiffness.  Skin: Positive for color change.  Allergic/Immunologic: Positive for environmental allergies.  Neurological: Positive for dizziness, weakness, numbness and headaches.  Hematological: Bruises/bleeds easily.  Psychiatric/Behavioral: Negative.        Objective:   Physical Exam        Assessment & Plan:

## 2014-01-25 ENCOUNTER — Telehealth: Payer: Self-pay | Admitting: *Deleted

## 2014-01-25 LAB — HEMOGLOBIN A1C
Hgb A1c MFr Bld: 7.4 % — ABNORMAL HIGH (ref <5.7)
Mean Plasma Glucose: 166 mg/dL — ABNORMAL HIGH (ref <117)

## 2014-01-25 LAB — TSH: TSH: 3.161 u[IU]/mL (ref 0.350–4.500)

## 2014-01-25 LAB — MICROALBUMIN, URINE: Microalb, Ur: 122.1 mg/dL — ABNORMAL HIGH (ref <2.0)

## 2014-01-25 LAB — PSA, MEDICARE: PSA: 0.57 ng/mL (ref <=4.00)

## 2014-01-25 NOTE — Telephone Encounter (Signed)
Phoned Kathlee Nations at Dr. Tarri Abernethy office to request diabetic eye exam report (patient had informed Dr. Tamala Julian during CPE yesterday he had completed it, etc)....  Since Dr. Tamala Julian isn't listed as a provider, a signed release authorization was requested.  Faxed request at [phone] for most recent DEE

## 2014-01-25 NOTE — Telephone Encounter (Signed)
Lilia Pro from Dr. Trish Fountain office phoned when she received fax & stated patient HAD to sign release form.  Will attempt to contact patient.   Phoned patient's home voicemail & [facility] or stop by with my name, title, location and request for his signature on documents.

## 2014-01-26 NOTE — Telephone Encounter (Signed)
[name] came in & signed release form and release & data request faxed to Dr. Trish Fountain office.   Awaiting fax from optometry

## 2014-01-28 ENCOUNTER — Encounter: Payer: Self-pay | Admitting: Family Medicine

## 2014-01-28 DIAGNOSIS — I5030 Unspecified diastolic (congestive) heart failure: Secondary | ICD-10-CM | POA: Insufficient documentation

## 2014-01-29 NOTE — Telephone Encounter (Signed)
Phoned to follow up with Jacob [name] at Dr. Trish Fountain office---stated she attempted to fax documentation Friday, but it would not transmit-mailed to my attention---should be receiving in the next couple of days.Marland KitchenMarland Kitchen

## 2014-01-31 NOTE — Telephone Encounter (Signed)
Received diabetic eye exam report via [employer], updated health maintenance

## 2014-02-01 LAB — IFOBT (OCCULT BLOOD): IFOBT: NEGATIVE

## 2014-02-04 MED ORDER — LOVASTATIN 20 MG PO TABS: 20.0000 mg | ORAL_TABLET | Freq: Every day | ORAL | Status: DC

## 2014-02-04 NOTE — Addendum Note (Signed)
Addended by: Wardell Honour on: 02/04/2014 01:27 PM   Modules accepted: Orders

## 2014-02-23 ENCOUNTER — Telehealth: Payer: Self-pay

## 2014-02-23 NOTE — Telephone Encounter (Signed)
LMVM for patient to get his flu shot.

## 2014-03-18 ENCOUNTER — Ambulatory Visit: Payer: PRIVATE HEALTH INSURANCE

## 2014-03-22 ENCOUNTER — Encounter: Payer: Self-pay | Admitting: Family Medicine

## 2014-03-22 ENCOUNTER — Ambulatory Visit (INDEPENDENT_AMBULATORY_CARE_PROVIDER_SITE_OTHER): Payer: PRIVATE HEALTH INSURANCE | Admitting: Family Medicine

## 2014-03-22 ENCOUNTER — Ambulatory Visit (INDEPENDENT_AMBULATORY_CARE_PROVIDER_SITE_OTHER): Payer: PRIVATE HEALTH INSURANCE

## 2014-03-22 VITALS — BP 148/82 | HR 82 | Temp 98.2°F | Resp 18 | Ht 70.0 in | Wt 236.2 lb

## 2014-03-22 DIAGNOSIS — J441 Chronic obstructive pulmonary disease with (acute) exacerbation: Secondary | ICD-10-CM

## 2014-03-22 DIAGNOSIS — J0101 Acute recurrent maxillary sinusitis: Secondary | ICD-10-CM

## 2014-03-22 DIAGNOSIS — E1121 Type 2 diabetes mellitus with diabetic nephropathy: Secondary | ICD-10-CM

## 2014-03-22 LAB — POCT CBC
Granulocyte percent: 84.6 %G — AB (ref 37–80)
HCT, POC: 42.3 % — AB (ref 43.5–53.7)
Hemoglobin: 13.7 g/dL — AB (ref 14.1–18.1)
Lymph, poc: 1.4 (ref 0.6–3.4)
MCH, POC: 29.6 pg (ref 27–31.2)
MCHC: 32.4 g/dL (ref 31.8–35.4)
MCV: 91.5 fL (ref 80–97)
MID (cbc): 0.3 (ref 0–0.9)
MPV: 6.8 fL (ref 0–99.8)
POC Granulocyte: 9.1 — AB (ref 2–6.9)
POC LYMPH PERCENT: 12.6 %L (ref 10–50)
POC MID %: 2.8 %M (ref 0–12)
Platelet Count, POC: 227 10*3/uL (ref 142–424)
RBC: 4.62 M/uL — AB (ref 4.69–6.13)
RDW, POC: 16.8 %
WBC: 10.8 10*3/uL — AB (ref 4.6–10.2)

## 2014-03-22 LAB — GLUCOSE, POCT (MANUAL RESULT ENTRY): POC Glucose: 318 mg/dl — AB (ref 70–99)

## 2014-03-22 MED ORDER — PREDNISONE 20 MG PO TABS: ORAL_TABLET | ORAL | Status: DC

## 2014-03-22 MED ORDER — AMOXICILLIN-POT CLAVULANATE 875-125 MG PO TABS: 1.0000 | ORAL_TABLET | Freq: Two times a day (BID) | ORAL | Status: DC

## 2014-03-22 MED ORDER — GUAIFENESIN-CODEINE 100-10 MG/5ML PO SYRP: 5.0000 mL | ORAL_SOLUTION | Freq: Four times a day (QID) | ORAL | Status: DC | PRN

## 2014-03-22 MED ORDER — ALBUTEROL SULFATE (2.5 MG/3ML) 0.083% IN NEBU
5.0000 mg | INHALATION_SOLUTION | Freq: Once | RESPIRATORY_TRACT | Status: AC
  Administered 2014-03-22: 5 mg via RESPIRATORY_TRACT

## 2014-03-22 MED ORDER — METHYLPREDNISOLONE ACETATE 80 MG/ML IJ SUSP
120.0000 mg | Freq: Once | INTRAMUSCULAR | Status: AC
  Administered 2014-03-22: 120 mg via INTRAMUSCULAR

## 2014-03-22 NOTE — Patient Instructions (Signed)
1. Increase nebulizer treatment to four times daily for next week.

## 2014-03-22 NOTE — Progress Notes (Addendum)
Subjective:  This chart was scribed for Jacob Forts, MD by Jacob [name], ED Scribe. This Patient was seen in room 11 and the patients care was started at 5:10 PM   Patient ID: Jacob [name], male    DOB: [birth year], 74 y.o.   MRN: [name]  03/22/2014  Cough; Nasal Congestion; Headache; and Wheezing   HPI   HPI Comments: Jacob [name] is a 74 y.o. male with PMHx of asthma, COPD, diabetes, and polymyalgia rheumatica who presents to the [facility] complaining of cough onset 2 weeks prior. Pt states that he has associated HA, congestion, sore throat, post nasal drip and wheezing. Pt states that he has tried increasing his prednisone to 30mg  daily with no relief. Pt states that he has also used his nebulizer tid with no relief. Pt states that his symptoms are worse at night when trying to sleep. Pt denies fever, chills, ear pain or any new diaphoresis. Pt states that while on prednisone his blood sugar has been ranging from 350-500. Pt states that after lowering his dose of prednisone his sugars have come back down to the 100s.   Denies chest pain, DOE, orthopnea.  Chronic leg swelling is at baseline without recent worsening.    Pt states that he has recently had medications changes to his insulin and metformin. Pt states that adjustments to insulin have been occurring at the [place].   Pt states that his kidney function is borderline. Pt states that he has had a recent increase in protein in his urine. Pt states that his [place] provider and Kidney specialist are having conflicting views on whether he should be on insulin or metformin.  Pt reports that the nephrologist recommends that he restart Metformin so pt is currently taking Metformin 250mg  bid.  Nephrologist recommends decreasing or changing insulin dose.  Due to changes in management, sugars have been fluctuating.   Review of Systems  Constitutional: Negative for fever, chills and diaphoresis.  HENT: Positive for  congestion, postnasal drip, rhinorrhea, sinus pressure and sore throat. Negative for ear pain.   Respiratory: Positive for cough and wheezing. Negative for shortness of breath.   Cardiovascular: Positive for leg swelling. Negative for chest pain and palpitations.  Gastrointestinal: Negative for nausea, vomiting, abdominal pain and rectal pain.  Neurological: Positive for headaches.    Past Medical History  Diagnosis Date  . Allergy   . Asthma   . Diabetes mellitus without complication   . Cataract   . Arthritis   . Polymyalgia rheumatica     maintained on Prednisone, Plaquenil. Followed by rhuematology every 4 months/Jacob [name].  Marland Kitchen COPD (chronic obstructive pulmonary disease)   . Anemia   . Hypertension   . BPH (benign prostatic hyperplasia)    Past Surgical History  Procedure Laterality Date  . Appendectomy    . Hernia repair    . Coronary artery bypass graft  03/17/1995    Jacob [name]; followed every six months.  . Prostate surgery      TURP at [facility].   Allergies  Allergen Reactions  . Ace Inhibitors   . Ciprocinonide [Fluocinolone]   . Flunisolide   . Metformin And Related   . Sertraline   . Sulindac   . Terazosin         Objective:    BP 148/82 mmHg  Pulse 82  Temp(Src) 98.2 F (36.8 C) (Oral)  Resp 18  Ht 5\' 10"  (1.778 m)  Wt 236 lb 3.2 oz (107.14 kg)  BMI 33.89 kg/m2  SpO2 94%   Physical Exam  Constitutional: He is oriented to person, place, and time. He appears well-developed and well-nourished. No distress.  HENT:  Head: Normocephalic and atraumatic.  Right Ear: External ear normal.  Left Ear: External ear normal.  Nose: Mucosal edema and rhinorrhea present. Right sinus exhibits maxillary sinus tenderness and frontal sinus tenderness. Left sinus exhibits maxillary sinus tenderness and frontal sinus tenderness.  Mouth/Throat: No oropharyngeal exudate.  Eyes: Conjunctivae and EOM are normal.  Neck: Neck supple. No tracheal deviation present.  Cardiovascular:  Normal rate, regular rhythm and normal heart sounds.  Exam reveals no gallop and no friction rub.   No murmur heard. Pulmonary/Chest: Effort normal. No respiratory distress. He has wheezes. He has rhonchi.  Diffuse rhonchi and wheezing throughout. moderate air movement.  Musculoskeletal: Normal range of motion. He exhibits edema.  2+ pitting edema bilateral lower extremities.   Neurological: He is alert and oriented to person, place, and time.  Skin: Skin is warm and dry. He is not diaphoretic.  Psychiatric: He has a normal mood and affect. His behavior is normal.  Nursing note and vitals reviewed.  Results for orders placed or performed in visit on 03/22/14  POCT CBC  Result Value Ref Range   WBC 10.8 (A) 4.6 - 10.2 K/uL   Lymph, poc 1.4 0.6 - 3.4   POC LYMPH PERCENT 12.6 10 - 50 %L   MID (cbc) 0.3 0 - 0.9   POC MID % 2.8 0 - 12 %M   POC Granulocyte 9.1 (A) 2 - 6.9   Granulocyte percent 84.6 (A) 37 - 80 %G   RBC 4.62 (A) 4.69 - 6.13 M/uL   Hemoglobin 13.7 (A) 14.1 - 18.1 g/dL   HCT, POC 42.3 (A) 43.5 - 53.7 %   MCV 91.5 80 - 97 fL   MCH, POC 29.6 27 - 31.2 pg   MCHC 32.4 31.8 - 35.4 g/dL   RDW, POC 16.8 %   Platelet Count, POC 227 142 - 424 K/uL   MPV 6.8 0 - 99.8 fL  POCT glucose (manual entry)  Result Value Ref Range   POC Glucose 318 (A) 70 - 99 mg/dl   [facility] reading (PRIMARY) by  Dr. Tamala [name].  CXR: NAD  ALBUTEROL NEBULIZER 5MG  ADMINISTERED.  DEPOMEDROL 120MG  IM ADMINISTERED.    Assessment & Plan:   1. COPD with acute exacerbation   2. Type 2 diabetes mellitus with diabetic nephropathy   3. Acute recurrent maxillary sinusitis     1.  COPD: worsening with acute exacerbation.  S/p Depomedrol and Albuterol nebulizer in office; rx for Prednisone provided; increase Alubterol to qid scheduled for one week and then PRN. 2. Acute maxillary sinusitis:  Recurrent.  Rx for Augmentin provided; rx for cough syrup also provided. 3.  DMII: uncontrolled lately due to acute  infection, prednisone increase, and adjustments in medication; continue SSI to cover elevated sugars. 4. Nephropathy diabetic: New.  Undergoing nephrology consultation at the current time.  Question restarting Metformin and changing insulin; will need to obtain records.   Meds ordered this encounter  Medications  . metFORMIN (GLUCOPHAGE) 500 MG tablet    Sig: Take 250 mg by mouth 2 (two) times daily with a meal.  . albuterol (PROVENTIL) (2.5 MG/3ML) 0.083% nebulizer solution 5 mg    Sig:   . methylPREDNISolone acetate (DEPO-MEDROL) injection 120 mg    Sig:   . amoxicillin-clavulanate (AUGMENTIN) 875-125 MG per tablet    Sig: Take  1 tablet by mouth 2 (two) times daily.    Dispense:  20 tablet    Refill:  0  . predniSONE (DELTASONE) 20 MG tablet    Sig: Three tablets daily x 2 days, then two tablets daily x 5 days then one tablet daily x 3 days    Dispense:  19 tablet    Refill:  0  . guaiFENesin-codeine (ROBITUSSIN AC) 100-10 MG/5ML syrup    Sig: Take 5 mLs by mouth 4 (four) times daily as needed for cough.    Dispense:  473 mL    Refill:  0    SUGAR FREE    No Follow-up on file.     I personally performed the services described in this documentation, which was scribed in my presence. The recorded information has been reviewed and considered.   Kristi Elayne Guerin, M.D. [facility] [address] [place], [place]  [address] [phone] phone [phone] fax

## 2014-03-27 ENCOUNTER — Ambulatory Visit: Payer: Non-veteran care

## 2014-03-29 ENCOUNTER — Ambulatory Visit (INDEPENDENT_AMBULATORY_CARE_PROVIDER_SITE_OTHER): Payer: PRIVATE HEALTH INSURANCE

## 2014-03-29 ENCOUNTER — Ambulatory Visit (INDEPENDENT_AMBULATORY_CARE_PROVIDER_SITE_OTHER): Payer: PRIVATE HEALTH INSURANCE | Admitting: Family Medicine

## 2014-03-29 VITALS — BP 126/88 | HR 73 | Temp 98.0°F | Resp 18 | Ht 70.0 in | Wt 240.2 lb

## 2014-03-29 DIAGNOSIS — J988 Other specified respiratory disorders: Secondary | ICD-10-CM

## 2014-03-29 DIAGNOSIS — R059 Cough, unspecified: Secondary | ICD-10-CM

## 2014-03-29 DIAGNOSIS — I5030 Unspecified diastolic (congestive) heart failure: Secondary | ICD-10-CM

## 2014-03-29 DIAGNOSIS — R05 Cough: Secondary | ICD-10-CM

## 2014-03-29 DIAGNOSIS — M7989 Other specified soft tissue disorders: Secondary | ICD-10-CM

## 2014-03-29 DIAGNOSIS — J22 Unspecified acute lower respiratory infection: Secondary | ICD-10-CM

## 2014-03-29 DIAGNOSIS — N289 Disorder of kidney and ureter, unspecified: Secondary | ICD-10-CM

## 2014-03-29 DIAGNOSIS — E1142 Type 2 diabetes mellitus with diabetic polyneuropathy: Secondary | ICD-10-CM

## 2014-03-29 DIAGNOSIS — J441 Chronic obstructive pulmonary disease with (acute) exacerbation: Secondary | ICD-10-CM

## 2014-03-29 LAB — POCT CBC
Granulocyte percent: 91 %G — AB (ref 37–80)
HCT, POC: 45 % (ref 43.5–53.7)
Hemoglobin: 14.2 g/dL (ref 14.1–18.1)
Lymph, poc: 0.7 (ref 0.6–3.4)
MCH, POC: 29.5 pg (ref 27–31.2)
MCHC: 31.7 g/dL — AB (ref 31.8–35.4)
MCV: 93.2 fL (ref 80–97)
MID (cbc): 0.2 (ref 0–0.9)
MPV: 7 fL (ref 0–99.8)
POC Granulocyte: 9.7 — AB (ref 2–6.9)
POC LYMPH PERCENT: 6.9 %L — AB (ref 10–50)
POC MID %: 2.1 %M (ref 0–12)
Platelet Count, POC: 226 10*3/uL (ref 142–424)
RBC: 4.83 M/uL (ref 4.69–6.13)
RDW, POC: 18 %
WBC: 10.7 10*3/uL — AB (ref 4.6–10.2)

## 2014-03-29 LAB — GLUCOSE, POCT (MANUAL RESULT ENTRY): POC Glucose: 345 mg/dl — AB (ref 70–99)

## 2014-03-29 MED ORDER — FUROSEMIDE 20 MG PO TABS: 20.0000 mg | ORAL_TABLET | Freq: Two times a day (BID) | ORAL | Status: DC | PRN

## 2014-03-29 MED ORDER — LEVOFLOXACIN 750 MG PO TABS: 750.0000 mg | ORAL_TABLET | Freq: Every day | ORAL | Status: DC

## 2014-03-29 NOTE — Progress Notes (Addendum)
Subjective:  This chart was scribed for [name], MD by Mercy Moore, Medial Scribe. This patient was seen in room 12 and the patient's care was started at 5:07 PM.    Patient ID: Jacob [name], male    DOB: 1940/04/22, 74 y.o.   MRN: [name]  03/29/2014  Bronchitis and Asthma   HPI HPI Comments: Jacob [name] is a 74 y.o. male who presents to the [facility] for one week follow-up complaining of worsening symptoms last night after a few days of improvement. Patient was seen one week ago for COPD/asthma exacerbation. Patient discharged with Augmentin, Prednisone and Robitussin with Codeine. Patient reports that he felt that he was choking last night and administered breathing treatment. Patient reports "loosening up" of his chest congestion after his treatments. Patient reports cold chills the following morning, but is uncertain of fever. Patient reports administration of 4 nebulizer treatments per day at home when he is not working and 3 nebulizer treatments/day when he is. Patient reports that he went to work the day that his symptoms worsened and he experienced episode of headache and tinnitus, during which he had to sit/take a short break. Patient reports having to administer several treatments the previous nights; stating that he kept waking up during the night because it was hard for him to breath. Patient reports mild sore throat, ringing ears, chest soreness/tightness with coughing, shortness of breath, and wheezing. Patient reports that his blood sugar level measured at 410 this afternoon. Patient then took 15 more units of insulin after the reading. Patient reports "swelling all over" specifically at his gums, hands and feet for the past few weeks; pt is currently taking Lasix 20mg  once daily; weight has increased in past three weeks. Denies chest pain, palpitations. Patient reports abdminal pain and nausea today, but denies vomiting. Patient attributes  discomfort to the Prednisone.  Head congestion has improved from visit last week.     Review of Systems  Constitutional: Positive for chills and fatigue. Negative for fever and diaphoresis.  HENT: Positive for congestion, postnasal drip, rhinorrhea, tinnitus and voice change. Negative for ear pain, sinus pressure, sore throat and trouble swallowing.   Respiratory: Positive for cough, chest tightness, shortness of breath and wheezing.   Cardiovascular: Positive for leg swelling. Negative for chest pain and palpitations.  Gastrointestinal: Positive for nausea and abdominal pain. Negative for vomiting.  Skin: Negative for rash.  Neurological: Positive for headaches.    Past Medical History  Diagnosis Date  . Allergy   . Asthma   . Diabetes mellitus without complication   . Cataract   . Arthritis   . Polymyalgia rheumatica     maintained on Prednisone, Plaquenil. Followed by rhuematology every 4 months/[name].  Marland Kitchen COPD (chronic obstructive pulmonary disease)   . Anemia   . Hypertension   . BPH (benign prostatic hyperplasia)    Past Surgical History  Procedure Laterality Date  . Appendectomy    . Hernia repair    . Coronary artery bypass graft  03/17/1995    Wynonia Lawman; followed every six months.  . Prostate surgery      TURP at [facility]   Allergies  Allergen Reactions  . Ace Inhibitors   . Ciprocinonide [Fluocinolone]   . Flunisolide   . Metformin And Related   . Sertraline   . Sulindac   . Terazosin    Current Outpatient Prescriptions  Medication Sig Dispense Refill  . albuterol (PROVENTIL HFA;VENTOLIN HFA) 108 (90 BASE)  MCG/ACT inhaler Inhale 2 puffs into the lungs every 6 (six) hours as needed for wheezing or shortness of breath. 1 Inhaler 2  . albuterol (PROVENTIL) (2.5 MG/3ML) 0.083% nebulizer solution Take 2.5 mg by nebulization every 6 (six) hours as needed for wheezing or shortness of breath.    Marland Kitchen amLODipine (NORVASC) 10 MG tablet Take 10 mg by mouth daily.    Marland Kitchen  amoxicillin-clavulanate (AUGMENTIN) 875-125 MG per tablet Take 1 tablet by mouth 2 (two) times daily. 20 tablet 0  . cetirizine (ZYRTEC) 10 MG tablet Take 1 tablet (10 mg total) by mouth daily. 30 tablet 11  . Cholecalciferol 1000 UNITS capsule Take 1,000 Units by mouth daily.    Marland Kitchen doxycycline (VIBRAMYCIN) 100 MG capsule Take 1 capsule (100 mg total) by mouth 2 (two) times daily. 20 capsule 0  . erythromycin ophthalmic ointment Place into both eyes at bedtime. 3.5 g 0  . finasteride (PROSCAR) 5 MG tablet Take 5 mg by mouth daily.    . fluticasone (VERAMYST) 27.5 MCG/SPRAY nasal spray Place 2 sprays into the nose 2 (two) times daily.    . Fluticasone-Salmeterol (ADVAIR) 100-50 MCG/DOSE AEPB Inhale 1 puff into the lungs 2 (two) times daily. 3 each 3  . furosemide (LASIX) 20 MG tablet Take 1 tablet (20 mg total) by mouth 2 (two) times daily as needed. 60 tablet 3  . gabapentin (NEURONTIN) 300 MG capsule Take 1 capsule (300 mg total) by mouth 3 (three) times daily. PATIENT NEEDS OFFICE VISIT FOR ADDITIONAL REFILLS 90 capsule 0  . glipiZIDE (GLUCOTROL) 10 MG tablet Take 10 mg by mouth 2 (two) times daily before a meal.    . guaiFENesin-codeine (ROBITUSSIN AC) 100-10 MG/5ML syrup Take 5 mLs by mouth 4 (four) times daily as needed for cough. 473 mL 0  . hydrochlorothiazide (HYDRODIURIL) 25 MG tablet Take 25 mg by mouth daily.    . INSULIN GLARGINE Hartrandt Inject 100 mg into the skin.    Marland Kitchen ipratropium (ATROVENT) 0.02 % nebulizer solution USE 1 AMPULE IN NEBULIZER 4 TIMES A DAY 90 mL 5  . ipratropium (ATROVENT) 0.03 % nasal spray Place 2 sprays into the nose 2 (two) times daily. 30 mL 6  . losartan (COZAAR) 100 MG tablet Take 100 mg by mouth daily.    Marland Kitchen lovastatin (MEVACOR) 20 MG tablet Take 1 tablet (20 mg total) by mouth at bedtime. 90 tablet 1  . metFORMIN (GLUCOPHAGE) 500 MG tablet Take 250 mg by mouth 2 (two) times daily with a meal.    . montelukast (SINGULAIR) 10 MG tablet Take 1 tablet (10 mg total) by  mouth at bedtime. 90 tablet 3  . oxyCODONE-acetaminophen (PERCOCET/ROXICET) 5-325 MG per tablet Take by mouth every 4 (four) hours as needed for severe pain.    . predniSONE (DELTASONE) 20 MG tablet Three tablets daily x 2 days, then two tablets daily x 5 days then one tablet daily x 3 days 19 tablet 0  . predniSONE (DELTASONE) 5 MG tablet Take 1-2 tablets (5-10 mg total) by mouth daily. 60 tablet 3  . Tamsulosin HCl (FLOMAX) 0.4 MG CAPS Take by mouth.    . levofloxacin (LEVAQUIN) 750 MG tablet Take 1 tablet (750 mg total) by mouth daily. 7 tablet 0   No current facility-administered medications for this visit.       Objective:    BP 126/88 mmHg  Pulse 73  Temp(Src) 98 F (36.7 C) (Oral)  Resp 18  Ht 5\' 10"  (1.778 m)  Wt 240 lb 3.2 oz (108.954 kg)  BMI 34.47 kg/m2  SpO2 93% Physical Exam  Constitutional: He is oriented to person, place, and time. He appears well-developed and well-nourished. No distress.  HENT:  Head: Normocephalic and atraumatic.  Right Ear: External ear normal.  Left Ear: External ear normal.  Nose: Mucosal edema and rhinorrhea present. Right sinus exhibits no maxillary sinus tenderness and no frontal sinus tenderness. Left sinus exhibits no maxillary sinus tenderness and no frontal sinus tenderness.  Mouth/Throat: Oropharynx is clear and moist. No oropharyngeal exudate.  Eyes: EOM are normal.  Neck: Neck supple. No tracheal deviation present.  Cardiovascular: Normal rate, regular rhythm and normal heart sounds.  Exam reveals no gallop and no friction rub.   No murmur heard. Pulmonary/Chest: Effort normal and breath sounds normal. No respiratory distress. He has no wheezes. He has no rales.  Lungs clear bilaterally. Good air movement. No wheezing.   Musculoskeletal: He exhibits edema.  2+ pitting edema to proximal calves bilaterally.  Lymphadenopathy:    He has no cervical adenopathy.  Neurological: He is alert and oriented to person, place, and time.    Skin: Skin is warm and dry. He is not diaphoretic.  Psychiatric: He has a normal mood and affect. His behavior is normal.  Nursing note and vitals reviewed.  Results for orders placed or performed in visit on 03/29/14  Comprehensive metabolic panel  Result Value Ref Range   Sodium 137 135 - 145 mEq/L   Potassium 5.2 3.5 - 5.3 mEq/L   Chloride 99 96 - 112 mEq/L   CO2 29 19 - 32 mEq/L   Glucose, Bld 314 (H) 70 - 99 mg/dL   BUN 32 (H) 6 - 23 mg/dL   Creat 1.41 (H) 0.50 - 1.35 mg/dL   Total Bilirubin 0.7 0.2 - 1.2 mg/dL   Alkaline Phosphatase 78 39 - 117 U/L   AST 13 0 - 37 U/L   ALT 25 0 - 53 U/L   Total Protein 6.1 6.0 - 8.3 g/dL   Albumin 3.4 (L) 3.5 - 5.2 g/dL   Calcium 9.2 8.4 - 10.5 mg/dL  POCT CBC  Result Value Ref Range   WBC 10.7 (A) 4.6 - 10.2 K/uL   Lymph, poc 0.7 0.6 - 3.4   POC LYMPH PERCENT 6.9 (A) 10 - 50 %L   MID (cbc) 0.2 0 - 0.9   POC MID % 2.1 0 - 12 %M   POC Granulocyte 9.7 (A) 2 - 6.9   Granulocyte percent 91.0 (A) 37 - 80 %G   RBC 4.83 4.69 - 6.13 M/uL   Hemoglobin 14.2 14.1 - 18.1 g/dL   HCT, POC 45.0 43.5 - 53.7 %   MCV 93.2 80 - 97 fL   MCH, POC 29.5 27 - 31.2 pg   MCHC 31.7 (A) 31.8 - 35.4 g/dL   RDW, POC 18.0 %   Platelet Count, POC 226 142 - 424 K/uL   MPV 7.0 0 - 99.8 fL  POCT glucose (manual entry)  Result Value Ref Range   POC Glucose 345 (A) 70 - 99 mg/dl   [facility] reading (PRIMARY) by  Dr. Tamala Julian. CXR: NAD  EKG: NSR; no acute changes.    Assessment & Plan:   1. Leg swelling   2. Cough   3. COPD exacerbation   4. Type 2 diabetes mellitus with diabetic polyneuropathy   5. Renal insufficiency   6. Lower respiratory infection   7. CHF with left ventricular diastolic dysfunction,  NYHA class 2     1.  COPD exacerbation: improving from last week; complete Prednisone course and then wean to baseline daily Prednisone dose for PMR.  Continue scheduled nebulizer treatments qid for next week; avoid cold weather exposure which seems to acutely  worsen symptoms. 2.  Lower respiratory infection: persistent with significant sputum production; add Levaquin 750mg  daily. 3.  Leg swelling: worsening; increase Lasix to 20mg  bid for one week and then decrease down to once daily with resolution of swelling.  Stable EKG.  Known Grade II diastolic dysfunction. 4. Renal insufficiency: stable; obtain labs; followed by nephrology at this time for proteinuria. 5. DMII: uncontrolled due to acute infection and increase Prednisone use; continue SSI for elevated readings.    Meds ordered this encounter  Medications  . levofloxacin (LEVAQUIN) 750 MG tablet    Sig: Take 1 tablet (750 mg total) by mouth daily.    Dispense:  7 tablet    Refill:  0  . furosemide (LASIX) 20 MG tablet    Sig: Take 1 tablet (20 mg total) by mouth 2 (two) times daily as needed.    Dispense:  60 tablet    Refill:  3    No Follow-up on file.    I personally performed the services described in this documentation, which was scribed in my presence. The recorded information has been reviewed and considered.   Kemarion Abbey Elayne Guerin, M.D. [facility] [address] [place], [place]  [address] [phone] phone [phone] fax

## 2014-03-29 NOTE — Patient Instructions (Signed)
1. START LEVOFLOXACIN/LEVAQUIN DAILY. 2.  INCREASE FUROSEMIDE 20MG  TO ONE PILL TWICE DAILY WHILE HAVING LEG SWELLING; WHEN LEG SWELLING IMPROVES, DECREASE TO FUROSEMIDE 20MG  ONE PILL DAILY.

## 2014-03-30 LAB — COMPREHENSIVE METABOLIC PANEL
ALT: 25 U/L (ref 0–53)
AST: 13 U/L (ref 0–37)
Albumin: 3.4 g/dL — ABNORMAL LOW (ref 3.5–5.2)
Alkaline Phosphatase: 78 U/L (ref 39–117)
BUN: 32 mg/dL — ABNORMAL HIGH (ref 6–23)
CO2: 29 mEq/L (ref 19–32)
Calcium: 9.2 mg/dL (ref 8.4–10.5)
Chloride: 99 mEq/L (ref 96–112)
Creat: 1.41 mg/dL — ABNORMAL HIGH (ref 0.50–1.35)
Glucose, Bld: 314 mg/dL — ABNORMAL HIGH (ref 70–99)
Potassium: 5.2 mEq/L (ref 3.5–5.3)
Sodium: 137 mEq/L (ref 135–145)
Total Bilirubin: 0.7 mg/dL (ref 0.2–1.2)
Total Protein: 6.1 g/dL (ref 6.0–8.3)

## 2014-04-02 ENCOUNTER — Ambulatory Visit: Payer: Non-veteran care

## 2014-04-16 ENCOUNTER — Ambulatory Visit: Payer: Non-veteran care

## 2014-04-19 ENCOUNTER — Ambulatory Visit: Payer: Non-veteran care

## 2014-04-30 ENCOUNTER — Encounter: Payer: Non-veteran care | Admitting: Physical Therapy

## 2014-05-03 ENCOUNTER — Encounter: Payer: Non-veteran care | Admitting: Physical Therapy

## 2014-05-14 ENCOUNTER — Encounter: Payer: Non-veteran care | Admitting: Physical Therapy

## 2014-05-17 ENCOUNTER — Encounter: Payer: Non-veteran care | Admitting: Physical Therapy

## 2014-05-28 ENCOUNTER — Ambulatory Visit: Payer: No Typology Code available for payment source | Attending: Physician Assistant

## 2014-05-28 ENCOUNTER — Encounter: Payer: Non-veteran care | Admitting: Physical Therapy

## 2014-05-28 DIAGNOSIS — Z79899 Other long term (current) drug therapy: Secondary | ICD-10-CM | POA: Insufficient documentation

## 2014-05-28 DIAGNOSIS — J45909 Unspecified asthma, uncomplicated: Secondary | ICD-10-CM | POA: Insufficient documentation

## 2014-05-28 DIAGNOSIS — M545 Low back pain, unspecified: Secondary | ICD-10-CM

## 2014-05-28 DIAGNOSIS — Z7952 Long term (current) use of systemic steroids: Secondary | ICD-10-CM | POA: Insufficient documentation

## 2014-05-28 DIAGNOSIS — M25659 Stiffness of unspecified hip, not elsewhere classified: Secondary | ICD-10-CM

## 2014-05-28 DIAGNOSIS — J449 Chronic obstructive pulmonary disease, unspecified: Secondary | ICD-10-CM | POA: Diagnosis not present

## 2014-05-28 DIAGNOSIS — E119 Type 2 diabetes mellitus without complications: Secondary | ICD-10-CM | POA: Insufficient documentation

## 2014-05-28 DIAGNOSIS — M353 Polymyalgia rheumatica: Secondary | ICD-10-CM | POA: Insufficient documentation

## 2014-05-28 DIAGNOSIS — I10 Essential (primary) hypertension: Secondary | ICD-10-CM | POA: Diagnosis not present

## 2014-05-28 DIAGNOSIS — R6889 Other general symptoms and signs: Secondary | ICD-10-CM | POA: Diagnosis not present

## 2014-05-28 NOTE — Patient Instructions (Signed)
Perform all exercises below:  Hold _20___ seconds. Repeat _3___ times.  Do __3__ sessions per day. CAUTION: Movement should be gentle, steady and slow.  Perform all exercises below:  Hold _20___ seconds. Repeat _3___ times.  Do __3__ sessions per day. CAUTION: Movement should be gentle, steady and slow.  Knee to Chest  Lying supine, bend involved knee to chest. Perform with each leg.  Copyright  VHI. All rights reserved.   Lumbar Rotation: Caudal - Bilateral (Supine)  Feet and knees together, arms outstretched, rotate knees left, turning head in opposite direction, until stretch is felt.         HIP: Hamstrings - Short Sitting   Rest leg on raised surface. Keep knee straight. Lift chest. Hold _20__ seconds. _3__ reps per set, _3__ sets per day.  Copyright  VHI. All rights reserved.

## 2014-05-28 NOTE — Therapy (Signed)
[facility] [address] [place], [place], [address] Phone: [phone]   Fax:  [phone]  Physical Therapy Evaluation  Patient Details  Name: Jacob DILLAHUNT MRN: [name] Date of Birth: 12-07-1940 Referring Provider:  Desiree Hane, PA  Encounter Date: 05/28/2014      PT End of Session - 05/28/14 0940    Visit Number 1   Number of Visits 10  Medicare   Date for PT Re-Evaluation 07/23/14   Authorization Type VA   Authorization Time Period 05/28/14-07/27/14   Authorization - Visit Number 1   Authorization - Number of Visits 12   PT Start Time [phone]      Past Medical History  Diagnosis Date  . Allergy   . Asthma   . Diabetes mellitus without complication   . Cataract   . Arthritis   . Polymyalgia rheumatica     maintained on Prednisone, Plaquenil. Followed by rhuematology every 4 months/[name].  Marland Kitchen COPD (chronic obstructive pulmonary disease)   . Anemia   . Hypertension   . BPH (benign prostatic hyperplasia)     Past Surgical History  Procedure Laterality Date  . Appendectomy    . Hernia repair    . Coronary artery bypass graft  03/17/1995    Wynonia Lawman; followed every six months.  . Prostate surgery      TURP at [facility].    There were no vitals filed for this visit.  Visit Diagnosis:  Right-sided low back pain without sciatica - Plan: PT plan of care cert/re-cert  Hip stiffness, unspecified laterality - Plan: PT plan of care cert/re-cert  Activity intolerance - Plan: PT plan of care cert/re-cert      Subjective Assessment - 05/28/14 0930    Symptoms Pt is a 74 y.o. male who presents to PT with complaints of LBP and spinal stenosis.  Pt reports that he needs to have decompression surgery and would like to avoid surgery.     Pertinent History no surgical history.  Pt reports that he has been sick and inactive due to breathing difficulties over the past 6 weeks.   Limitations Standing;Walking   How long  can you stand comfortably? 5 minutes max   How long can you walk comfortably? 5 minutes max   Diagnostic tests Pt reports stenosis and DDD- imaging done 3-4 years ago per pt report   Patient Stated Goals stand and walk longer without rest, reduce pain   Currently in Pain? Yes   Pain Score 9   3-10/10 range over the past week   Pain Location Back   Pain Orientation Lower;Right   Pain Type Chronic pain   Pain Radiating Towards both legs   Pain Onset More than a month ago   Aggravating Factors  standing and walking, driving   Pain Relieving Factors sitting, lay on the floor, heat/ice, medication   Effect of Pain on Daily Activities limited standing and walking   Multiple Pain Sites No            [facility] PT Assessment - 05/28/14 0001    Assessment   Medical Diagnosis LBP (M54.5)   Onset Date 05/28/07  chronic since 1976   Prior Therapy many years ago at this clinic   Precautions   Precautions None   Restrictions   Weight Bearing Restrictions No   Balance Screen   Has the patient fallen in the past 6 months No   Has the patient had a decrease in activity level  because of a fear of falling?  No   Is the patient reluctant to leave their home because of a fear of falling?  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   Cognition   Overall Cognitive Status Within Functional Limits for tasks assessed   Observation/Other Assessments   Focus on Therapeutic Outcomes (FOTO)  62% limitaiton   Posture/Postural Control   Posture/Postural Control No significant limitations   ROM / Strength   AROM /  PROM / Strength AROM;PROM;Strength   AROM   Overall AROM Comments Lumbar AROM: flexion is full, extension limited by 90% with pain, sidebending limited by 25% with pain to the Lt.   AROM Assessment Site Lumbar   PROM   Overall PROM  Deficits   Overall PROM Comments Hamstring flexibility limited by 30%, hip IR limited by 25% bilaterally   PROM Assessment Site Hip   Strength   Overall Strength Within functional limits for tasks performed   Overall Strength Comments 4+/5 hip and knee strength bilaterally, DF 5/5   Strength Assessment Site Hip;Knee   Palpation   Palpation Pt with palpable tenderness over bilateral lumbar paraspinals, SI joints and with PA glides L1-5.   Ambulation/Gait   Ambulation/Gait Yes   Ambulation/Gait Assistance 6: Modified independent (Device/Increase time)   Gait Pattern Within Functional Limits   Ambulation Surface Level                   [facility] Adult PT Treatment/Exercise - 05/28/14 0001    Exercises   Exercises Knee/Hip;Lumbar   Lumbar Exercises: Stretches   Active Hamstring Stretch 3 reps;20 seconds   Single Knee to Chest Stretch 3 reps;20 seconds   Lower Trunk Rotation 3 reps;20 seconds   Modalities   Modalities Moist Heat   Moist Heat Therapy   Number Minutes Moist Heat 15 Minutes   Moist Heat Location Other (comment)  Lumbar                 PT Education - 05/28/14 1005    Education provided Yes   Education Details HEP   Person(s) Educated Patient   Methods Explanation;Demonstration;Handout   Comprehension Verbalized understanding;Returned demonstration          PT Short Term Goals - 05/28/14 0953    PT SHORT TERM GOAL #1   Title be independent in initial HEP   Time 4   Period Weeks   Status New   PT SHORT TERM GOAL #2   Title stand and walk for 10 minutes without limitation   Time 4   Period Weeks   Status New   PT SHORT TERM GOAL #3   Title report  < or = to 6/10 LBP with standing and walking   Time 4    Period Weeks   Status New           PT Long Term Goals - 05/28/14 [id number]    PT LONG TERM GOAL #1   Title be independent in final HEP   Time 8   Period  Weeks   Status New   PT LONG TERM GOAL #2   Title reduce FOTO to < or = to 47% limitation   Time 8   Period Weeks   Status New   PT LONG TERM GOAL #4   Title stand and walk for 20 minutes without limitation   Time 8   Period Weeks   Status New   PT LONG TERM GOAL #5   Title report < or  = to 5/10 LBP with standing and walking   Time 8   Period Weeks   Status New               Plan - 2014/06/03 0956    Clinical Impression Statement Pt presents chronic LBP that was exaccerbated by 6 weeks of illness and limited activity.  Pt presents with stiffness in the lumbar spine and hips.  Pt will benefit from core stab, flexibility and modalities for pain to improve standing function.   Pt will benefit from skilled therapeutic intervention in order to improve on the following deficits Decreased range of motion;Difficulty walking;Improper body mechanics;Decreased endurance;Decreased activity tolerance;Pain;Decreased mobility   Rehab Potential Good   PT Frequency 2x / week   PT Duration 8 weeks   PT Treatment/Interventions ADLs/Self Care Home Management;Moist Heat;Therapeutic activities;Therapeutic exercise;Ultrasound;Manual techniques;Cryotherapy;Neuromuscular re-education;Electrical Stimulation   PT Next Visit Plan Body mechanics education, flexibility, endurance tasks, modalities   Consulted and Agree with Plan of Care Patient          G-Codes - Jun 03, 2014 0919    Functional Assessment Tool Used FOTO: 62% limitation   Functional Limitation Other PT subsequent   Other PT Secondary Current Status [id number]) At least 60 percent but less than 80 percent impaired, limited or restricted   Other PT Secondary Goal Status [id number]) At least 40 percent but less than 60 percent impaired, limited or restricted       Problem List Patient  Active Problem List   Diagnosis Date Noted  . CHF with left ventricular diastolic dysfunction, NYHA class 2 01/28/2014  . PMR (polymyalgia rheumatica) 09/09/2013  . COPD exacerbation 08/02/2012  . Rheumatoid arthritis 04/13/2012  . CAD (coronary artery disease) 12/19/2011  . Diabetes mellitus, type 2 12/19/2011  . Neuropathy 12/19/2011    Castulo Scarpelli Jun 03, 2014, 10:19 AM  [facility] [address], [place], [place], [address] Phone: [phone]   Fax:  [phone]

## 2014-05-31 ENCOUNTER — Ambulatory Visit: Payer: No Typology Code available for payment source

## 2014-05-31 ENCOUNTER — Encounter: Payer: Self-pay | Admitting: Physical Therapy

## 2014-05-31 ENCOUNTER — Ambulatory Visit: Payer: No Typology Code available for payment source | Admitting: Physical Therapy

## 2014-05-31 DIAGNOSIS — M545 Low back pain, unspecified: Secondary | ICD-10-CM

## 2014-05-31 DIAGNOSIS — M25659 Stiffness of unspecified hip, not elsewhere classified: Secondary | ICD-10-CM

## 2014-05-31 DIAGNOSIS — R6889 Other general symptoms and signs: Secondary | ICD-10-CM

## 2014-05-31 NOTE — Patient Instructions (Signed)
Posture - Standing   Good posture is important. Avoid slouching and forward head thrust. Maintain curve in low back and align ears over shoulders, hips over ankles.  Pull your belly button in toward your back bone. Posture Tips DO: - stand tall and erect - keep chin tucked in - keep head and shoulders in alignment - check posture regularly in mirror or large window - pull head back against headrest in car seat;  Change your position often.  Sit with lumbar support. DON'T: - slouch or slump while watching TV or reading - sit, stand or lie in one position  for too long;  Sitting is especially hard on the spine so if you sit at a desk/use the computer, then stand up often! Copyright  VHI. All rights reserved.  Posture - Sitting  Sit upright, head facing forward. Try using a roll to support lower back. Keep shoulders relaxed, and avoid rounded back. Keep hips level with knees. Avoid crossing legs for long periods. Copyright  VHI. All rights reserved.  Chronic neck strain can develop because of poor posture and faulty work habits  Postural strain related to slumped sitting and forward head posture is a leading cause of headaches, neck and upper back pain  General strengthening and flexibility exercises are helpful in the treatment of neck pain.  Most importantly, you should learn to correct the posture that may be contributing to chronic pain.   Change positions frequently  Change your work or home environment to improve posture and mechanics.    

## 2014-05-31 NOTE — Therapy (Signed)
[facility] [address], [place], [place], [address] Phone: [phone]   Fax:  [phone]  Physical Therapy Treatment  Patient Details  Name: Jacob [name] MRN: [id number] Date of Birth: Jul 15, 1940 Referring Provider:  Wardell Honour, MD  Encounter Date: 05/31/2014      PT End of Session - 05/31/14 1402    Visit Number 2   Number of Visits 10   Date for PT Re-Evaluation 07/23/14   Authorization Type VA   Authorization Time Period 05/28/14-07/27/14   Authorization - Visit Number 2   Authorization - Number of Visits 12   Activity Tolerance Patient tolerated treatment well   Behavior During Therapy [facility] for tasks assessed/performed      Past Medical History  Diagnosis Date  . Allergy   . Asthma   . Diabetes mellitus without complication   . Cataract   . Arthritis   . Polymyalgia rheumatica     maintained on Prednisone, Plaquenil. Followed by rhuematology every 4 months/[name].  Marland Kitchen COPD (chronic obstructive pulmonary disease)   . Anemia   . Hypertension   . BPH (benign prostatic hyperplasia)     Past Surgical History  Procedure Laterality Date  . Appendectomy    . Hernia repair    . Coronary artery bypass graft  03/17/1995    Wynonia Lawman; followed every six months.  . Prostate surgery      TURP at [facility].    There were no vitals filed for this visit.  Visit Diagnosis:  Right-sided low back pain without sciatica  Hip stiffness, unspecified laterality  Activity intolerance                     [facility] Adult PT Treatment/Exercise - 05/31/14 0001    Lumbar Exercises: Aerobic   UBE (Upper Arm Bike) 6 (3/3) L 1   Lumbar Exercises: Supine   Glut Set 20 reps;5 seconds   Glut Set Limitations weak contraction   Bridge Limitations  x 2 discontinued due to incr of pain   Other Supine Lumbar Exercises Multifidi strength red t-band into bil horizontal abd, over head narrow grip, over head wide grip  using red t-band   Meeks exercise routine    Modalities   Modalities Moist Heat;Electrical Stimulation   Moist Heat Therapy   Number Minutes Moist Heat 15 Minutes   Moist Heat Location Other (comment)  lumbar   Electrical Stimulation   Electrical Stimulation Location Low back   Electrical Stimulation Action IFC   Electrical Stimulation Parameters 80-15-Hz   Electrical Stimulation Goals Pain                PT Education - 05/31/14 1002    Education provided Yes   Education Details [employer] standing and stiting, Meeks overhead Pull narrow grip, wide grip, double arm, Sash,    Methods Explanation;Demonstration;Handout   Comprehension Verbalized understanding;Returned demonstration          PT Short Term Goals - 05/28/14 0953    PT SHORT TERM GOAL #1   Title be independent in initial HEP   Time 4   Period Weeks   Status New   PT SHORT TERM GOAL #2   Title stand and walk for 10 minutes without limitation   Time 4   Period Weeks   Status New   PT SHORT TERM GOAL #3   Title report  < or = to 6/10 LBP with standing and walking   Time 4  Period Weeks   Status New           PT Long Term Goals - 05/28/14 [id number]    PT LONG TERM GOAL #1   Title be independent in final HEP   Time 8   Period Weeks   Status New   PT LONG TERM GOAL #2   Title reduce FOTO to < or = to 47% limitation   Time 8   Period Weeks   Status New   PT LONG TERM GOAL #4   Title stand and walk for 20 minutes without limitation   Time 8   Period Weeks   Status New   PT LONG TERM GOAL #5   Title report < or  = to 5/10 LBP with standing and walking   Time 8   Period Weeks   Status New               Plan - 05/31/14 1402    Clinical Impression Statement Pt with chronic back pain and stiffness in lumbar spine and hips. He will benefit from skilled PT   Pt will benefit from skilled therapeutic intervention in order to improve on the following deficits Decreased range of  motion;Difficulty walking;Improper body mechanics;Decreased endurance;Decreased activity tolerance;Pain;Decreased mobility   Rehab Potential Good   PT Frequency 2x / week   PT Duration 8 weeks   PT Treatment/Interventions ADLs/Self Care Home Management;Moist Heat;Therapeutic activities;Therapeutic exercise;Ultrasound;Manual techniques;Cryotherapy;Neuromuscular re-education;Electrical Stimulation   PT Next Visit Plan Body mechanics education, flexibility, endurance tasks, modalities   Consulted and Agree with Plan of Care Patient        Problem List Patient Active Problem List   Diagnosis Date Noted  . CHF with left ventricular diastolic dysfunction, NYHA class 2 01/28/2014  . PMR (polymyalgia rheumatica) 09/09/2013  . COPD exacerbation 08/02/2012  . Rheumatoid arthritis 04/13/2012  . CAD (coronary artery disease) 12/19/2011  . Diabetes mellitus, type 2 12/19/2011  . Neuropathy 12/19/2011    NAUMANN-HOUEGNIFIO,Mekenna Finau PTA 05/31/2014, 2:05 PM  [facility] [address], [place], [place], [address] Phone: [phone]   Fax:  [phone]

## 2014-06-11 ENCOUNTER — Ambulatory Visit: Payer: No Typology Code available for payment source | Admitting: Physical Therapy

## 2014-06-11 ENCOUNTER — Encounter: Payer: Self-pay | Admitting: Physical Therapy

## 2014-06-11 DIAGNOSIS — M545 Low back pain, unspecified: Secondary | ICD-10-CM

## 2014-06-11 DIAGNOSIS — M25659 Stiffness of unspecified hip, not elsewhere classified: Secondary | ICD-10-CM

## 2014-06-11 DIAGNOSIS — R6889 Other general symptoms and signs: Secondary | ICD-10-CM

## 2014-06-11 NOTE — Therapy (Signed)
[facility] [address] [place], [place], [address] Phone: [phone]   Fax:  [phone]  Physical Therapy Treatment  Patient Details  Name: Jacob [name] MRN: [id number] Date of Birth: 1940/08/19 Referring Provider:  Wardell Honour, MD  Encounter Date: 06/11/2014      PT End of Session - 06/11/14 1348    Visit Number 3   Number of Visits 10   Date for PT Re-Evaluation 07/23/14   Authorization Type VA   Authorization Time Period 05/28/14-07/27/14   Authorization - Number of Visits 12   PT Start Time 0929   PT Stop Time 1033   PT Time Calculation (min) 64 min   Activity Tolerance Patient tolerated treatment well   Behavior During Therapy [facility] for tasks assessed/performed      Past Medical History  Diagnosis Date  . Allergy   . Asthma   . Diabetes mellitus without complication   . Cataract   . Arthritis   . Polymyalgia rheumatica     maintained on Prednisone, Plaquenil. Followed by rhuematology every 4 months/[name].  Marland Kitchen COPD (chronic obstructive pulmonary disease)   . Anemia   . Hypertension   . BPH (benign prostatic hyperplasia)     Past Surgical History  Procedure Laterality Date  . Appendectomy    . Hernia repair    . Coronary artery bypass graft  03/17/1995    Wynonia Lawman; followed every six months.  . Prostate surgery      TURP at [facility].    There were no vitals filed for this visit.  Visit Diagnosis:  No diagnosis found.      Subjective Assessment - 06/11/14 0947    Symptoms Pt is a 74 y.o. male who presents to PT with complaints of LBP and spinal stenosis.  Pt reports that he needs to have decompression surgery and would like to avoid surgery.     Pertinent History no surgical history.  Pt reports that he has been sick and inactive due to breathing difficulties over the past 6 weeks.   Limitations Standing;Walking   How long can you stand comfortably? 5 min max   How long can you walk  comfortably? 5 minutes max   Diagnostic tests Pt reports stenosis and DDD- imaging done 3-4 years ago per pt report   Patient Stated Goals stand and walk longer without rest, reduce pain   Currently in Pain? Yes   Pain Score 8   no complains of pain with supine position   Pain Location Back   Pain Orientation Right   Pain Descriptors / Indicators Cramping  pt describes it as pinching a nerv, have to stop until it resolvest   Pain Radiating Towards both legs, R LE > L LE   Pain Onset More than a month ago   Aggravating Factors  standing, walking and driving   Pain Relieving Factors neutral sitting, supine, heat/ice, medication   Effect of Pain on Daily Activities limits standing and walking, Pt will stop his parttime job April 22 due to not more able to sit painfree for a prolonged time   Multiple Pain Sites No   Multiple Pain Sites No                       [facility] Adult PT Treatment/Exercise - 06/11/14 0001    Lumbar Exercises: Stretches   Active Hamstring Stretch 3 reps;20 seconds  in sitting   Single Knee to Chest Stretch  3 reps;20 seconds   Lower Trunk Rotation 3 reps;20 seconds   Lumbar Exercises: Aerobic   Stationary Bike 10 level 3   Lumbar Exercises: Standing   Other Standing Lumbar Exercises opposite UE/LE sliding UE up and down  for stretching front of body    Lumbar Exercises: Supine   Glut Set 20 reps;5 seconds   Glut Set Limitations weak contraction   Other Supine Lumbar Exercises Multifidi strength red t-band into bil horizontal abd, over head narrow grip, over head wide grip using red t-band   Meeks exercise routine    Modalities   Modalities Moist Heat;Electrical Stimulation   Moist Heat Therapy   Number Minutes Moist Heat 15 Minutes   Moist Heat Location Other (comment)  lumbar   Electrical Stimulation   Electrical Stimulation Location Low back   Electrical Stimulation Action IFC   Electrical Stimulation Parameters 80-150Hz    Electrical  Stimulation Goals Pain                  PT Short Term Goals - 06/11/14 1347    PT SHORT TERM GOAL #1   Title be independent in initial HEP   Time 4   Period Weeks   Status On-going   PT SHORT TERM GOAL #2   Title stand and walk for 10 minutes without limitation   Time 4   Period Weeks   Status On-going   PT SHORT TERM GOAL #3   Title report  < or = to 6/10 LBP with standing and walking   Time 4   Period Weeks   Status On-going           PT Long Term Goals - 06/11/14 1347    PT LONG TERM GOAL #1   Title be independent in final HEP   Time 8   Period Weeks   Status On-going   PT LONG TERM GOAL #2   Title reduce FOTO to < or = to 47% limitation   Time 8   Period Weeks   Status On-going   PT LONG TERM GOAL #4   Title stand and walk for 20 minutes without limitation   Time 8   Period Weeks   Status On-going   PT LONG TERM GOAL #5   Title report < or  = to 5/10 LBP with standing and walking   Time 8   Period Weeks   Status On-going               Plan - 06/11/14 1352    Clinical Impression Statement Pt will benefit from skilled PT. due to chronic back pain and stiffness in lumbar spine and hips.   Pt will benefit from skilled therapeutic intervention in order to improve on the following deficits Decreased range of motion;Difficulty walking;Improper body mechanics;Decreased endurance;Decreased activity tolerance;Pain;Decreased mobility   Rehab Potential Good   PT Frequency 2x / week   PT Duration 8 weeks   PT Treatment/Interventions ADLs/Self Care Home Management;Moist Heat;Therapeutic activities;Therapeutic exercise;Ultrasound;Manual techniques;Cryotherapy;Neuromuscular re-education;Electrical Stimulation   PT Next Visit Plan  flexibility, endurance tasks, modalities   Consulted and Agree with Plan of Care Patient        Problem List Patient Active Problem List   Diagnosis Date Noted  . CHF with left ventricular diastolic dysfunction, NYHA  class 2 01/28/2014  . PMR (polymyalgia rheumatica) 09/09/2013  . COPD exacerbation 08/02/2012  . Rheumatoid arthritis 04/13/2012  . CAD (coronary artery disease) 12/19/2011  . Diabetes mellitus, type 2 12/19/2011  .  Neuropathy 12/19/2011    NAUMANN-HOUEGNIFIO,Nethan Caudillo PTA 06/11/2014, 1:53 PM  [facility] [address], [place], [place], [address] Phone: [phone]   Fax:  [phone]

## 2014-06-14 ENCOUNTER — Encounter: Payer: Self-pay | Admitting: Physical Therapy

## 2014-06-14 ENCOUNTER — Ambulatory Visit: Payer: No Typology Code available for payment source | Admitting: Physical Therapy

## 2014-06-14 DIAGNOSIS — R6889 Other general symptoms and signs: Secondary | ICD-10-CM

## 2014-06-14 DIAGNOSIS — M25659 Stiffness of unspecified hip, not elsewhere classified: Secondary | ICD-10-CM

## 2014-06-14 DIAGNOSIS — M545 Low back pain, unspecified: Secondary | ICD-10-CM

## 2014-06-14 NOTE — Therapy (Signed)
[facility] [address], [place], [place], [address] Phone: [phone]   Fax:  [phone]  Physical Therapy Treatment  Patient Details  Name: Jacob [name] MRN: [name] Date of Birth: 21-Mar-1940 Referring Provider:  Desiree Hane, PA  Encounter Date: 06/14/2014      PT End of Session - 06/14/14 0949    Visit Number 4   Number of Visits 10  VA   Date for PT Re-Evaluation 07/23/14   Authorization Type VA   Authorization - Visit Number 3   Authorization - Number of Visits 12   PT Start Time 0927   PT Stop Time 1029   PT Time Calculation (min) 62 min   Activity Tolerance Patient tolerated treatment well   Behavior During Therapy [facility] for tasks assessed/performed      Past Medical History  Diagnosis Date  . Allergy   . Asthma   . Diabetes mellitus without complication   . Cataract   . Arthritis   . Polymyalgia rheumatica     maintained on Prednisone, Plaquenil. Followed by rhuematology every 4 months/[name].  Marland Kitchen COPD (chronic obstructive pulmonary disease)   . Anemia   . Hypertension   . BPH (benign prostatic hyperplasia)     Past Surgical History  Procedure Laterality Date  . Appendectomy    . Hernia repair    . Coronary artery bypass graft  03/17/1995    Wynonia Lawman; followed every six months.  . Prostate surgery      TURP at [facility].    There were no vitals filed for this visit.  Visit Diagnosis:  Right-sided low back pain without sciatica  Hip stiffness, unspecified laterality  Activity intolerance      Subjective Assessment - 06/14/14 0937    Symptoms Pt with antalgic gait today hip is shifted to left, pain rated as 7/10   Pertinent History no surgical history.  Pt reports that he has been sick and inactive due to breathing difficulties over the past 6 weeks.   Limitations Sitting;Standing   Currently in Pain? Yes   Pain Score 7                        [facility] Adult PT  Treatment/Exercise - 06/14/14 0001    Lumbar Exercises: Stretches   Active Hamstring Stretch 3 reps;20 seconds  in supine using strap, while on MHP   Single Knee to Chest Stretch 3 reps;20 seconds  while on HMP   Lower Trunk Rotation 3 reps;20 seconds  on HMP   Lumbar Exercises: Aerobic   Stationary Bike no bike today due to discomfort   Lumbar Exercises: Standing   Other Standing Lumbar Exercises Mc Kenzie lat shift to R 2 x10 with 5sec hold   Lumbar Exercises: Seated   Other Seated Lumbar Exercises green physioball ant/post lat/med pelvic tilts, posture awarness    Other Seated Lumbar Exercises green ball red t-band horizontal abd, D2 2 x 10 each    Lumbar Exercises: Supine   Glut Set 20 reps;5 seconds   Glut Set Limitations weak contraction   Modalities   Modalities Moist Heat;Electrical Stimulation   Moist Heat Therapy   Number Minutes Moist Heat 20 Minutes   Moist Heat Location Other (comment)  lumbar   Electrical Stimulation   Electrical Stimulation Location Low back   Electrical Stimulation Action IFC   Electrical Stimulation Parameters 80-150   Electrical Stimulation Goals Pain  PT Education - 06/14/14 1018    Education provided Yes   Education Details Lynden Oxford lat shift to right   Person(s) Educated Patient   Methods Explanation;Demonstration;Handout   Comprehension Returned demonstration          PT Short Term Goals - 06/11/14 1347    PT SHORT TERM GOAL #1   Title be independent in initial HEP   Time 4   Period Weeks   Status On-going   PT SHORT TERM GOAL #2   Title stand and walk for 10 minutes without limitation   Time 4   Period Weeks   Status On-going   PT SHORT TERM GOAL #3   Title report  < or = to 6/10 LBP with standing and walking   Time 4   Period Weeks   Status On-going           PT Long Term Goals - 06/11/14 1347    PT LONG TERM GOAL #1   Title be independent in final HEP   Time 8   Period Weeks   Status  On-going   PT LONG TERM GOAL #2   Title reduce FOTO to < or = to 47% limitation   Time 8   Period Weeks   Status On-going   PT LONG TERM GOAL #4   Title stand and walk for 20 minutes without limitation   Time 8   Period Weeks   Status On-going   PT LONG TERM GOAL #5   Title report < or  = to 5/10 LBP with standing and walking   Time 8   Period Weeks   Status On-going               Plan - 06/14/14 0954    Clinical Impression Statement Patient with antalgic gait, pt is reducing his prednisone dosage in accordane with MD (taking since >5 years), what causes all joints to hurt   Pt will benefit from skilled therapeutic intervention in order to improve on the following deficits Decreased range of motion;Difficulty walking;Improper body mechanics;Decreased endurance;Decreased activity tolerance;Pain;Decreased mobility   PT Frequency 2x / week   PT Duration 8 weeks   PT Treatment/Interventions ADLs/Self Care Home Management;Moist Heat;Therapeutic activities;Therapeutic exercise;Ultrasound;Manual techniques;Cryotherapy;Neuromuscular re-education;Electrical Stimulation   PT Next Visit Plan  flexibility, endurance tasks, modalities   Consulted and Agree with Plan of Care Patient        Problem List Patient Active Problem List   Diagnosis Date Noted  . CHF with left ventricular diastolic dysfunction, NYHA class 2 01/28/2014  . PMR (polymyalgia rheumatica) 09/09/2013  . COPD exacerbation 08/02/2012  . Rheumatoid arthritis 04/13/2012  . CAD (coronary artery disease) 12/19/2011  . Diabetes mellitus, type 2 12/19/2011  . Neuropathy 12/19/2011    NAUMANN-HOUEGNIFIO,Ikechukwu Cerny 06/14/2014, 10:33 AM  [facility] [address], [place], [place], [address] Phone: [phone]   Fax:  [phone]

## 2014-06-14 NOTE — Patient Instructions (Addendum)
Wall Lean Stretch   With right  hand against wall, slowly stretch hips toward wall, other arm supporting trunk. Hold _5 up to 20___ seconds. Relax. Repeat __10 or 3 with long holds. Thoracolumbar Side-Bend: Manual (Standing)   Right  hand on hip, other hand at hip crest level, gently press hands together, shifting hips to left . Hold 5 sec or 20___ seconds. Relax. Repeat 10  Or 3  times per set. Do _1 - 3  sets per session. Do __3__ sessions per day.  Copyright  VHI. All rights reserved.

## 2014-06-25 ENCOUNTER — Ambulatory Visit: Payer: Non-veteran care | Attending: Physician Assistant | Admitting: Physical Therapy

## 2014-06-25 ENCOUNTER — Encounter: Payer: Self-pay | Admitting: Physical Therapy

## 2014-06-25 DIAGNOSIS — R6889 Other general symptoms and signs: Secondary | ICD-10-CM | POA: Diagnosis not present

## 2014-06-25 DIAGNOSIS — E119 Type 2 diabetes mellitus without complications: Secondary | ICD-10-CM | POA: Diagnosis not present

## 2014-06-25 DIAGNOSIS — J45909 Unspecified asthma, uncomplicated: Secondary | ICD-10-CM | POA: Insufficient documentation

## 2014-06-25 DIAGNOSIS — M545 Low back pain, unspecified: Secondary | ICD-10-CM

## 2014-06-25 DIAGNOSIS — Z7952 Long term (current) use of systemic steroids: Secondary | ICD-10-CM | POA: Diagnosis not present

## 2014-06-25 DIAGNOSIS — Z79899 Other long term (current) drug therapy: Secondary | ICD-10-CM | POA: Insufficient documentation

## 2014-06-25 DIAGNOSIS — M353 Polymyalgia rheumatica: Secondary | ICD-10-CM | POA: Diagnosis not present

## 2014-06-25 DIAGNOSIS — I10 Essential (primary) hypertension: Secondary | ICD-10-CM | POA: Insufficient documentation

## 2014-06-25 DIAGNOSIS — M25659 Stiffness of unspecified hip, not elsewhere classified: Secondary | ICD-10-CM

## 2014-06-25 DIAGNOSIS — J449 Chronic obstructive pulmonary disease, unspecified: Secondary | ICD-10-CM | POA: Insufficient documentation

## 2014-06-25 NOTE — Therapy (Signed)
[facility] [address] [place], [place], [address] Phone: [phone]   Fax:  [phone]  Physical Therapy Treatment  Patient Details  Name: Jacob [name] MRN: [name] Date of Birth: 12-11-1940 Referring Provider:  Wardell Honour, MD  Encounter Date: 06/25/2014      PT End of Session - 06/25/14 0938    Visit Number 5   Number of Visits 10   Date for PT Re-Evaluation 07/23/14   Authorization Type VA   Authorization Time Period 05/28/14-07/27/14   Authorization - Visit Number 5   Authorization - Number of Visits 12   PT Start Time 0930   PT Stop Time 1032   PT Time Calculation (min) 62 min   Activity Tolerance Patient tolerated treatment well   Behavior During Therapy [facility] for tasks assessed/performed      Past Medical History  Diagnosis Date  . Allergy   . Asthma   . Diabetes mellitus without complication   . Cataract   . Arthritis   . Polymyalgia rheumatica     maintained on Prednisone, Plaquenil. Followed by rhuematology every 4 months/[name].  Marland Kitchen COPD (chronic obstructive pulmonary disease)   . Anemia   . Hypertension   . BPH (benign prostatic hyperplasia)     Past Surgical History  Procedure Laterality Date  . Appendectomy    . Hernia repair    . Coronary artery bypass graft  03/17/1995    Wynonia Lawman; followed every six months.  . Prostate surgery      TURP at [facility].    There were no vitals filed for this visit.  Visit Diagnosis:  Right-sided low back pain without sciatica  Hip stiffness, unspecified laterality  Activity intolerance      Subjective Assessment - 06/25/14 0932    Subjective Weekend was terrible after working last week, low back was hurting up to 8/10, this morning is not bad rated as 4/10, but as the day goes on it gets worse   Pertinent History no surgical history.  Pt reports that he has been sick and inactive due to breathing difficulties over the past 6 weeks.   How long  can you stand comfortably? 5 min max   How long can you walk comfortably? 5 minutes max   Diagnostic tests Pt reports stenosis and DDD- imaging done 3-4 years ago per pt report   Currently in Pain? Yes   Pain Score 4    Pain Location Back   Pain Orientation Right   Pain Descriptors / Indicators Cramping   Pain Type Chronic pain   Pain Radiating Towards buttocks and bil LE   Pain Onset More than a month ago   Aggravating Factors  standing, walking and driving   Pain Relieving Factors neutral sitting, supine, heat/ice, medication   Effect of Pain on Daily Activities llimits stadnding and walking, pt will stop his parttime job on April 22                       [facility] Adult PT Treatment/Exercise - 06/25/14 0001    Lumbar Exercises: Stretches   Active Hamstring Stretch 3 reps;20 seconds  while on HMP   Single Knee to Chest Stretch 3 reps;20 seconds  while on HMP   Lower Trunk Rotation 3 reps;20 seconds  while on HMP   Lumbar Exercises: Aerobic   UBE (Upper Arm Bike) 6 (3/3) L 2   Lumbar Exercises: Standing   Other Standing Lumbar  Exercises Lynden Oxford lat shift to R 3x10 with 5sec hold  sitting down after each set   Lumbar Exercises: Supine   Glut Set 20 reps;5 seconds   Glut Set Limitations weak contraction   Other Supine Lumbar Exercises Multifidi strength 3x10, red t-band into bil horizontal abd, over head narrow grip, over head wide grip using red t-band   while over HMP   Modalities   Modalities Moist Heat;Electrical Stimulation   Moist Heat Therapy   Number Minutes Moist Heat 20 Minutes   Moist Heat Location Other (comment)   Electrical Stimulation   Electrical Stimulation Location Low back   Electrical Stimulation Action IFC   Electrical Stimulation Parameters 80-150   Electrical Stimulation Goals Pain                  PT Short Term Goals - 06/25/14 0946    PT SHORT TERM GOAL #1   Title be independent in initial HEP   Period Weeks   Status  On-going   PT SHORT TERM GOAL #2   Title stand and walk for 10 minutes without limitations   Time 4   Status On-going   PT SHORT TERM GOAL #3   Title report  < or = to 6/10 LBP with standing and walking   Time 4   Period Weeks   Status On-going           PT Long Term Goals - 06/25/14 0949    PT LONG TERM GOAL #1   Title be independent in final HEP   Time 8   Period Weeks   Status On-going   PT LONG TERM GOAL #2   Title reduce FOTO to < or = to 47% limitation   Time 8   Period Weeks   Status On-going   PT LONG TERM GOAL #4   Title stand and walk for 20 minutes without limitation   Time 8   Period Weeks   Status On-going   PT LONG TERM GOAL #5   Title report < or  = to 5/10 LBP with standing and walking   Time 8   Period Weeks   Status On-going               Plan - 06/25/14 0941    Clinical Impression Statement patient with antalgic gait, hip shifting to left side, patient reports had to increase his prednisone dosage to his regular intake.   Pt will benefit from skilled therapeutic intervention in order to improve on the following deficits Decreased range of motion;Difficulty walking;Improper body mechanics;Decreased endurance;Decreased activity tolerance;Pain;Decreased mobility   Rehab Potential Good   PT Frequency 2x / week   PT Duration 8 weeks   PT Treatment/Interventions ADLs/Self Care Home Management;Moist Heat;Therapeutic activities;Therapeutic exercise;Ultrasound;Manual techniques;Cryotherapy;Neuromuscular re-education;Electrical Stimulation   PT Next Visit Plan  continue with Lynden Oxford, lumbar and hip flexibility, modalities   Consulted and Agree with Plan of Care Patient        Problem List Patient Active Problem List   Diagnosis Date Noted  . CHF with left ventricular diastolic dysfunction, NYHA class 2 01/28/2014  . PMR (polymyalgia rheumatica) 09/09/2013  . COPD exacerbation 08/02/2012  . Rheumatoid arthritis 04/13/2012  . CAD (coronary  artery disease) 12/19/2011  . Diabetes mellitus, type 2 12/19/2011  . Neuropathy 12/19/2011    NAUMANN-HOUEGNIFIO,Kay Shippy PTA 06/25/2014, 10:04 AM  [facility] [address], [place], [place], [address] Phone: [phone]   Fax:  [phone]      

## 2014-06-28 ENCOUNTER — Encounter: Payer: Non-veteran care | Admitting: Physical Therapy

## 2014-07-05 ENCOUNTER — Other Ambulatory Visit: Payer: Self-pay | Admitting: Family Medicine

## 2014-07-09 ENCOUNTER — Encounter: Payer: Self-pay | Admitting: Physical Therapy

## 2014-07-09 ENCOUNTER — Ambulatory Visit: Payer: Non-veteran care | Admitting: Physical Therapy

## 2014-07-09 DIAGNOSIS — M545 Low back pain, unspecified: Secondary | ICD-10-CM

## 2014-07-09 DIAGNOSIS — R6889 Other general symptoms and signs: Secondary | ICD-10-CM

## 2014-07-09 DIAGNOSIS — M25659 Stiffness of unspecified hip, not elsewhere classified: Secondary | ICD-10-CM

## 2014-07-09 NOTE — Therapy (Signed)
[facility] [address], [place], [place], [address] Phone: [phone]   Fax:  [phone]  Physical Therapy Treatment  Patient Details  Name: Jacob [name] MRN: [name] Date of Birth: 10-15-1940 Referring Provider:  Wardell Honour, MD  Encounter Date: 07/09/2014      PT End of Session - 07/09/14 0951    Visit Number 6   Number of Visits 10   Date for PT Re-Evaluation 07/23/14   Authorization Type VA   Authorization Time Period 05/28/14-07/27/14   Authorization - Visit Number 6   Authorization - Number of Visits 12   PT Start Time 0931   PT Stop Time 1015   PT Time Calculation (min) 44 min   Activity Tolerance Patient tolerated treatment well   Behavior During Therapy [facility] for tasks assessed/performed      Past Medical History  Diagnosis Date  . Allergy   . Asthma   . Diabetes mellitus without complication   . Cataract   . Arthritis   . Polymyalgia rheumatica     maintained on Prednisone, Plaquenil. Followed by rhuematology every 4 months/[name].  Marland Kitchen COPD (chronic obstructive pulmonary disease)   . Anemia   . Hypertension   . BPH (benign prostatic hyperplasia)     Past Surgical History  Procedure Laterality Date  . Appendectomy    . Hernia repair    . Coronary artery bypass graft  03/17/1995    Wynonia Lawman; followed every six months.  . Prostate surgery      TURP at [facility].    There were no vitals filed for this visit.  Visit Diagnosis:  Right-sided low back pain without sciatica  Hip stiffness, unspecified laterality  Activity intolerance      Subjective Assessment - 07/09/14 0939    Subjective The back feels a litlle bit better   Pertinent History no surgical history.  Pt reports that he has been sick and inactive due to breathing difficulties over the past 6 weeks.   Limitations Sitting;Standing   How long can you stand comfortably? 5 min max   How long can you walk comfortably? 5 minutes  max   Diagnostic tests Pt reports stenosis and DDD- imaging done 3-4 years ago per pt report   Patient Stated Goals stand and walk longer without rest, reduce pain   Currently in Pain? Yes   Pain Score 2    Pain Location Back   Pain Orientation Right   Pain Descriptors / Indicators Cramping   Pain Type Chronic pain   Pain Radiating Towards buttoacks and bil LE   Pain Onset More than a month ago   Pain Frequency Constant   Aggravating Factors  standing walking and driving   Pain Relieving Factors moist heat, neutral position, meds   Effect of Pain on Daily Activities limited standing and walking, pt had his last day with his part time job last week   Multiple Pain Sites No                         [facility] Adult PT Treatment/Exercise - 07/09/14 0001    Lumbar Exercises: Stretches   Active Hamstring Stretch 3 reps;20 seconds   Single Knee to Chest Stretch 3 reps;20 seconds   Lower Trunk Rotation --  not practiced today, pt with severe cough neded to sit up   Lumbar Exercises: Aerobic   UBE (Upper Arm Bike) 6 (3/3) L 2   Lumbar  Exercises: Standing   Other Standing Lumbar Exercises opposite UE/LE sliding UE up and down  each side x 10 each   Other Standing Lumbar Exercises Mc Kenzie lat shift to R 1x10 with 5sec hold   Lumbar Exercises: Seated   Other Seated Lumbar Exercises green physioball ant/post lat/med pelvic tilts, posture awarness   2 x 10 each   Other Seated Lumbar Exercises green ball red t-band horizontal abd, D2 3 x 10 each   with focus on abdominal activation   Lumbar Exercises: Supine   Glut Set 20 reps;5 seconds;Other (comment)  practiced in sitting today, due to cough in supine   Glut Set Limitations weak contraction  practiced in sitting on table and on green physio ball   Other Supine Lumbar Exercises Multifidi strength 1x10, red t-band into bil horizontal abd, over head narrow grip, over head wide grip using red t-band    Modalities   Modalities  --  pt needs to leave early today - no modalities today                  PT Short Term Goals - 07/09/14 0955    PT SHORT TERM GOAL #1   Title be independent in initial HEP   Time 4   Period Weeks   Status On-going   PT SHORT TERM GOAL #2   Title stand and walk for 10 minutes without limitations   Time 4   Period Weeks   Status On-going   PT SHORT TERM GOAL #3   Title report  < or = to 6/10 LBP with standing and walking   Time 4   Period Weeks   Status On-going           PT Long Term Goals - 07/09/14 [id number]    PT LONG TERM GOAL #1   Title be independent in final HEP   Time 8   Period Weeks   Status On-going   PT LONG TERM GOAL #2   Title reduce FOTO to < or = to 47% limitation   Time 8   Period Weeks   Status On-going               Plan - 07/09/14 [id number]    Clinical Impression Statement patient with antalgic gait, hip shifting to left side.   Pt will benefit from skilled therapeutic intervention in order to improve on the following deficits Decreased range of motion;Difficulty walking;Improper body mechanics;Decreased endurance;Decreased activity tolerance;Pain;Decreased mobility   Rehab Potential Good   PT Frequency 2x / week   PT Duration 8 weeks   PT Treatment/Interventions ADLs/Self Care Home Management;Moist Heat;Therapeutic activities;Therapeutic exercise;Ultrasound;Manual techniques;Cryotherapy;Neuromuscular re-education;Electrical Stimulation   PT Next Visit Plan  continue with Lynden Oxford, lumbar and hip flexibility, modalities   Consulted and Agree with Plan of Care Patient        Problem List Patient Active Problem List   Diagnosis Date Noted  . CHF with left ventricular diastolic dysfunction, NYHA class 2 01/28/2014  . PMR (polymyalgia rheumatica) 09/09/2013  . COPD exacerbation 08/02/2012  . Rheumatoid arthritis(714.0) 04/13/2012  . CAD (coronary artery disease) 12/19/2011  . Diabetes mellitus, type 2 12/19/2011  . Neuropathy  12/19/2011    NAUMANN-HOUEGNIFIO,Kiera Hussey PTA 07/09/2014, 10:13 AM  [facility] [address], [place], [place], [address] Phone: [phone]   Fax:  [phone]

## 2014-07-12 ENCOUNTER — Encounter: Payer: Non-veteran care | Admitting: Physical Therapy

## 2014-07-16 ENCOUNTER — Ambulatory Visit: Payer: Non-veteran care | Attending: Physician Assistant | Admitting: Physical Therapy

## 2014-07-16 ENCOUNTER — Encounter: Payer: Self-pay | Admitting: Physical Therapy

## 2014-07-16 DIAGNOSIS — E119 Type 2 diabetes mellitus without complications: Secondary | ICD-10-CM | POA: Insufficient documentation

## 2014-07-16 DIAGNOSIS — J45909 Unspecified asthma, uncomplicated: Secondary | ICD-10-CM | POA: Insufficient documentation

## 2014-07-16 DIAGNOSIS — J449 Chronic obstructive pulmonary disease, unspecified: Secondary | ICD-10-CM | POA: Insufficient documentation

## 2014-07-16 DIAGNOSIS — M545 Low back pain, unspecified: Secondary | ICD-10-CM

## 2014-07-16 DIAGNOSIS — Z79899 Other long term (current) drug therapy: Secondary | ICD-10-CM | POA: Diagnosis not present

## 2014-07-16 DIAGNOSIS — M353 Polymyalgia rheumatica: Secondary | ICD-10-CM | POA: Insufficient documentation

## 2014-07-16 DIAGNOSIS — R6889 Other general symptoms and signs: Secondary | ICD-10-CM | POA: Insufficient documentation

## 2014-07-16 DIAGNOSIS — Z7952 Long term (current) use of systemic steroids: Secondary | ICD-10-CM | POA: Diagnosis not present

## 2014-07-16 DIAGNOSIS — M25659 Stiffness of unspecified hip, not elsewhere classified: Secondary | ICD-10-CM | POA: Insufficient documentation

## 2014-07-16 DIAGNOSIS — I10 Essential (primary) hypertension: Secondary | ICD-10-CM | POA: Insufficient documentation

## 2014-07-16 NOTE — Therapy (Signed)
[facility] [address], [place], [place], [address] Phone: [phone]   Fax:  [phone]  Physical Therapy Treatment  Patient Details  Name: Jacob [name] MRN: [id number] Date of Birth: [birth year] Referring Provider:  Wardell Honour, MD  Encounter Date: 07/16/2014      PT End of Session - 07/16/14 1009    Visit Number 7   Number of Visits 10   Date for PT Re-Evaluation 07/23/14   Authorization Type VA   Authorization Time Period 05/28/14-07/27/14   Authorization - Visit Number 7   Authorization - Number of Visits 12   PT Start Time 0931   PT Stop Time 1025   PT Time Calculation (min) 54 min   Activity Tolerance Patient tolerated treatment well   Behavior During Therapy [facility] for tasks assessed/performed      Past Medical History  Diagnosis Date  . Allergy   . Asthma   . Diabetes mellitus without complication   . Cataract   . Arthritis   . Polymyalgia rheumatica     maintained on Prednisone, Plaquenil. Followed by rhuematology every 4 months/[name].  Marland Kitchen COPD (chronic obstructive pulmonary disease)   . Anemia   . Hypertension   . BPH (benign prostatic hyperplasia)     Past Surgical History  Procedure Laterality Date  . Appendectomy    . Hernia repair    . Coronary artery bypass graft  03/17/1995    Wynonia Lawman; followed every six months.  . Prostate surgery      TURP at [facility].    There were no vitals filed for this visit.  Visit Diagnosis:  Right-sided low back pain without sciatica  Activity intolerance  Hip stiffness, unspecified laterality      Subjective Assessment - 07/16/14 0935    Subjective Still hurts when I walk mainly. Pt coughing a lot due to bronchitis.    Currently in Pain? Yes   Pain Score 3    Pain Location Back  Buttocks area   Pain Orientation Right;Left;Lower   Pain Descriptors / Indicators Aching   Pain Type Chronic pain   Aggravating Factors  Walking and standing   Pain  Relieving Factors Heat, sitting, meds   Multiple Pain Sites No                         [facility] Adult PT Treatment/Exercise - 07/16/14 0001    Lumbar Exercises: Seated   Other Seated Lumbar Exercises Side bend LT stretch 3x 10 sec   Other Seated Lumbar Exercises green ball red t-band horizontal abd, D2 3 x 10 each   with focus on abdominal activation   Lumbar Exercises: Supine   Other Supine Lumbar Exercises Multifidus in sitting; red band lifts 2x 10    Knee/Hip Exercises: Standing   Forward Step Up Both;1 set;10 reps;Hand Hold: 1   Rebounder Wt shift 3 ways with abs contracted   Walking with Sports Cord 20# fwd and bkwdr 10x   Moist Heat Therapy   Number Minutes Moist Heat 15 Minutes   Moist Heat Location --  Lumbar    Electrical Stimulation   Electrical Stimulation Location Lumbar   Electrical Stimulation Action IFC   Electrical Stimulation Parameters 80-150 HZ   Electrical Stimulation Goals Pain                  PT Short Term Goals - 07/16/14 1011    PT SHORT TERM GOAL #1  Title be independent in initial HEP   Time 4   Period Weeks   Status On-going   PT SHORT TERM GOAL #2   Title stand and walk for 10 minutes without limitations   Time 4   Period Weeks   Status On-going  Patient had good day this Saturday, walking for "long time". Will see if this is consistent.    PT SHORT TERM GOAL #3   Title report  < or = to 6/10 LBP with standing and walking   Time 4   Period Weeks   Status On-going  Starting to improve           PT Long Term Goals - 07/09/14 0956    PT LONG TERM GOAL #1   Title be independent in final HEP   Time 8   Period Weeks   Status On-going   PT LONG TERM GOAL #2   Title reduce FOTO to < or = to 47% limitation   Time 8   Period Weeks   Status On-going               Plan - 07/16/14 1010    Clinical Impression Statement Better posture and gait when he doesn't hurt. Looked good today.    Pt will benefit  from skilled therapeutic intervention in order to improve on the following deficits Decreased range of motion;Difficulty walking;Improper body mechanics;Decreased endurance;Decreased activity tolerance;Pain;Decreased mobility   Rehab Potential Good   PT Frequency 2x / week   PT Duration 8 weeks   PT Treatment/Interventions ADLs/Self Care Home Management;Moist Heat;Therapeutic activities;Therapeutic exercise;Ultrasound;Manual techniques;Cryotherapy;Neuromuscular re-education;Electrical Stimulation   PT Next Visit Plan Core stabs, LE strength, modalities   Consulted and Agree with Plan of Care Patient        Problem List Patient Active Problem List   Diagnosis Date Noted  . CHF with left ventricular diastolic dysfunction, NYHA class 2 01/28/2014  . PMR (polymyalgia rheumatica) 09/09/2013  . COPD exacerbation 08/02/2012  . Rheumatoid arthritis(714.0) 04/13/2012  . CAD (coronary artery disease) 12/19/2011  . Diabetes mellitus, type 2 12/19/2011  . Neuropathy 12/19/2011    Cambrie Sonnenfeld, PTA 07/16/2014, 10:13 AM  [facility] [address] [place], [place], [address] Phone: [phone]   Fax:  [phone]

## 2014-07-19 ENCOUNTER — Ambulatory Visit: Payer: Non-veteran care | Admitting: Physical Therapy

## 2014-07-19 ENCOUNTER — Encounter: Payer: Self-pay | Admitting: Physical Therapy

## 2014-07-19 DIAGNOSIS — M545 Low back pain, unspecified: Secondary | ICD-10-CM

## 2014-07-19 DIAGNOSIS — R6889 Other general symptoms and signs: Secondary | ICD-10-CM

## 2014-07-19 DIAGNOSIS — M25659 Stiffness of unspecified hip, not elsewhere classified: Secondary | ICD-10-CM

## 2014-07-19 NOTE — Therapy (Signed)
[facility] [address], [place], [place], [address] Phone: [phone]   Fax:  [phone]  Physical Therapy Treatment  Patient Details  Name: Jacob [name] MRN: [name] Date of Birth: 1941-01-03 Referring Provider:  Wardell Honour, MD  Encounter Date: 07/19/2014      PT End of Session - 07/19/14 1121    Visit Number 8   Number of Visits 10   Date for PT Re-Evaluation 07/23/14   Authorization Type VA   Authorization Time Period 05/28/14-07/27/14   Authorization - Visit Number 8   Authorization - Number of Visits 12   PT Start Time 1100   PT Stop Time 1201   PT Time Calculation (min) 61 min   Activity Tolerance Patient tolerated treatment well   Behavior During Therapy [facility] for tasks assessed/performed      Past Medical History  Diagnosis Date  . Allergy   . Asthma   . Diabetes mellitus without complication   . Cataract   . Arthritis   . Polymyalgia rheumatica     maintained on Prednisone, Plaquenil. Followed by rhuematology every 4 months/[name].  Marland Kitchen COPD (chronic obstructive pulmonary disease)   . Anemia   . Hypertension   . BPH (benign prostatic hyperplasia)     Past Surgical History  Procedure Laterality Date  . Appendectomy    . Hernia repair    . Coronary artery bypass graft  03/17/1995    Wynonia Lawman; followed every six months.  . Prostate surgery      TURP at [facility].    There were no vitals filed for this visit.  Visit Diagnosis:  Right-sided low back pain without sciatica  Activity intolerance  Hip stiffness, unspecified laterality      Subjective Assessment - 07/19/14 1113    Subjective Pt will see MD due to bronchitis, today feeling sore due to arthritis achting up   Pertinent History no surgical history.  Pt reports that he has been sick and inactive due to breathing difficulties over the past 6 weeks.   Limitations Sitting;Standing   How long can you stand comfortably? 5 min max    How long can you walk comfortably? 5 minutes max   Diagnostic tests Pt reports stenosis and DDD- imaging done 3-4 years ago per pt report   Patient Stated Goals stand and walk longer without rest, reduce pain   Currently in Pain? Yes   Pain Score 2    Pain Location Back   Pain Orientation Right;Left;Lower   Pain Descriptors / Indicators Aching   Pain Type Chronic pain   Pain Onset More than a month ago   Aggravating Factors  walking, standing   Pain Relieving Factors heat, sitting, meds   Effect of Pain on Daily Activities limited standing and walking   Multiple Pain Sites Yes  not related to treatment diagnosis, due to arthritis                         [facility] Adult PT Treatment/Exercise - 07/19/14 0001    Lumbar Exercises: Stretches   Active Hamstring Stretch 3 reps;20 seconds   Single Knee to Chest Stretch 3 reps;20 seconds   Double Knee to Chest Stretch 3 reps;20 seconds   Lower Trunk Rotation 3 reps;20 seconds   Lumbar Exercises: Aerobic   UBE (Upper Arm Bike) 6 (3/3) L 2   Lumbar Exercises: Standing   Other Standing Lumbar Exercises opposite UE/LE sliding UE up  and down  each side x 10   Other Standing Lumbar Exercises Mc Kenzie lat shift to R 1x10 with 5sec hold   Lumbar Exercises: Supine   Glut Set 20 reps;5 seconds;Other (comment)  in sitting   Knee/Hip Exercises: Standing   Rebounder Wt shift 3 ways with abs contracted   Walking with Sports Cord 25# fwd/reverse and bkwdr/reverse 10x   Modalities   Modalities Moist Heat   Moist Heat Therapy   Number Minutes Moist Heat 15 Minutes   Moist Heat Location --  back   Electrical Stimulation   Electrical Stimulation Location Lumbar   Electrical Stimulation Action IFC   Electrical Stimulation Parameters 80-150Hz    Electrical Stimulation Goals Pain                  PT Short Term Goals - 07/16/14 1011    PT SHORT TERM GOAL #1   Title be independent in initial HEP   Time 4   Period Weeks    Status On-going   PT SHORT TERM GOAL #2   Title stand and walk for 10 minutes without limitations   Time 4   Period Weeks   Status On-going  Patient had good day this Saturday, walking for "long time". Will see if this is consistent.    PT SHORT TERM GOAL #3   Title report  < or = to 6/10 LBP with standing and walking   Time 4   Period Weeks   Status On-going  Starting to improve           PT Long Term Goals - 07/09/14 0956    PT LONG TERM GOAL #1   Title be independent in final HEP   Time 8   Period Weeks   Status On-going   PT LONG TERM GOAL #2   Title reduce FOTO to < or = to 47% limitation   Time 8   Period Weeks   Status On-going               Plan - 07/19/14 1126    Clinical Impression Statement Pelvic shift present but imporving, pt with swelling in both hands due to arthritis.   Pt will benefit from skilled therapeutic intervention in order to improve on the following deficits Decreased range of motion;Difficulty walking;Improper body mechanics;Decreased endurance;Decreased activity tolerance;Pain;Decreased mobility   Rehab Potential Good   PT Frequency 2x / week   PT Duration 8 weeks   PT Treatment/Interventions ADLs/Self Care Home Management;Moist Heat;Therapeutic activities;Therapeutic exercise;Ultrasound;Manual techniques;Cryotherapy;Neuromuscular re-education;Electrical Stimulation   PT Next Visit Plan D/C after Friday 13th, pt has eyesurgery this month,   PT Home Exercise Plan continue with current exercise program   Consulted and Agree with Plan of Care Patient        Problem List Patient Active Problem List   Diagnosis Date Noted  . CHF with left ventricular diastolic dysfunction, NYHA class 2 01/28/2014  . PMR (polymyalgia rheumatica) 09/09/2013  . COPD exacerbation 08/02/2012  . Rheumatoid arthritis(714.0) 04/13/2012  . CAD (coronary artery disease) 12/19/2011  . Diabetes mellitus, type 2 12/19/2011  . Neuropathy 12/19/2011     NAUMANN-HOUEGNIFIO,Pesach Frisch PTA 07/19/2014, 11:47 AM  [facility] [address], [place], [place], [address] Phone: [phone]   Fax:  [phone]

## 2014-07-23 ENCOUNTER — Ambulatory Visit: Payer: Non-veteran care | Admitting: Physical Therapy

## 2014-07-23 ENCOUNTER — Encounter: Payer: Self-pay | Admitting: Physical Therapy

## 2014-07-23 DIAGNOSIS — M545 Low back pain, unspecified: Secondary | ICD-10-CM

## 2014-07-23 DIAGNOSIS — M25659 Stiffness of unspecified hip, not elsewhere classified: Secondary | ICD-10-CM

## 2014-07-23 DIAGNOSIS — R6889 Other general symptoms and signs: Secondary | ICD-10-CM

## 2014-07-23 NOTE — Therapy (Signed)
[facility] [address], [place], [place], [address] Phone: [phone]   Fax:  [phone]  Physical Therapy Treatment  Patient Details  Name: Jacob [name] MRN: [name] Date of Birth: 1940/10/24 Referring Provider:  Wardell Honour, MD  Encounter Date: 07/23/2014      PT End of Session - 07/23/14 1006    Visit Number 9   Number of Visits 10   Date for PT Re-Evaluation 07/26/14   Authorization Type VA   Authorization Time Period 05/28/14-07/27/14   Authorization - Visit Number 8   Authorization - Number of Visits 12   PT Start Time 0928   PT Stop Time 1026   PT Time Calculation (min) 58 min   Activity Tolerance Patient tolerated treatment well   Behavior During Therapy [facility] for tasks assessed/performed      Past Medical History  Diagnosis Date  . Allergy   . Asthma   . Diabetes mellitus without complication   . Cataract   . Arthritis   . Polymyalgia rheumatica     maintained on Prednisone, Plaquenil. Followed by rhuematology every 4 months/[name].  Marland Kitchen COPD (chronic obstructive pulmonary disease)   . Anemia   . Hypertension   . BPH (benign prostatic hyperplasia)     Past Surgical History  Procedure Laterality Date  . Appendectomy    . Hernia repair    . Coronary artery bypass graft  03/17/1995    Wynonia Lawman; followed every six months.  . Prostate surgery      TURP at [facility].    There were no vitals filed for this visit.  Visit Diagnosis:  Right-sided low back pain without sciatica  Activity intolerance  Hip stiffness, unspecified laterality      Subjective Assessment - 07/23/14 0927    Subjective Pt is now on increased Prenisone and Antibiotica to help with his bronchitis, his back is feeling better today   How long can you stand comfortably? 10 minutes   How long can you walk comfortably? 10 minutes, when using walker or grocery store pt can walk 2minutes ?has been to [facility]   Diagnostic tests  Pt reports stenosis and DDD- imaging done 3-4 years ago per pt report   Patient Stated Goals stand and walk longer without rest, reduce pain   Currently in Pain? Yes   Pain Score 4   no pain with sitting or supine, but with standing or walking without AD    Pain Location Back   Pain Orientation Right;Left;Mid   Pain Descriptors / Indicators Aching   Pain Radiating Towards buttocks and bil LE   Pain Onset More than a month ago   Pain Frequency Constant   Aggravating Factors  walking and standing   Pain Relieving Factors heat, meds, resting in sitting or supine   Effect of Pain on Daily Activities limited standing and walking   Multiple Pain Sites No            [facility] PT Assessment - 07/23/14 0001    Observation/Other Assessments   Focus on Therapeutic Outcomes (FOTO)  53%limitations                     [facility] Adult PT Treatment/Exercise - 07/23/14 0001    Lumbar Exercises: Stretches   Active Hamstring Stretch 3 reps;20 seconds  standing at stairs, and sitting   Single Knee to Chest Stretch 3 reps;20 seconds   Double Knee to Chest Stretch 3 reps;20 seconds  Lower Trunk Rotation 3 reps;20 seconds   Lumbar Exercises: Aerobic   UBE (Upper Arm Bike) 6 (3/3) L 2   Lumbar Exercises: Standing   Other Standing Lumbar Exercises Mc Kenzie lat shift to R 1x10 with 5sec hold   Lumbar Exercises: Supine   Glut Set 10 reps;5 seconds;Other (comment)  in supine and sitting, pt with imporved contraction strength   Other Supine Lumbar Exercises Multifidus in sitting; red band lifts 1x 10   Multifidius in supine red t-band 1x10   Knee/Hip Exercises: Standing   Walking with Sports Cord 25# fwd/reverse and bkwdr/reverse 10x   Modalities   Modalities Moist Heat   Moist Heat Therapy   Number Minutes Moist Heat 15 Minutes   Moist Heat Location Other (comment)  back   Electrical Stimulation   Electrical Stimulation Location Lumbar   Electrical Stimulation Action IFC   Electrical  Stimulation Parameters 80-150Hz    Electrical Stimulation Goals Pain                  PT Short Term Goals - 07/23/14 1004    PT SHORT TERM GOAL #1   Title be independent in initial HEP   Time 4   Period Weeks   Status Achieved   PT SHORT TERM GOAL #2   Title stand and walk for 10 minutes without limitations   Time 4   Period Weeks   Status Achieved   PT SHORT TERM GOAL #3   Title report  < or = to 6/10 LBP with standing and walking   Time 4   Period Weeks   Status Achieved           PT Long Term Goals - 07/23/14 1005    PT LONG TERM GOAL #1   Title be independent in final HEP   Time 8   Period Weeks   Status On-going   PT LONG TERM GOAL #2   Title reduce FOTO to < or = to 47% limitation  as of 07/23/14 pt scored 53%limitatations   Time 8   Period Weeks   Status On-going   PT LONG TERM GOAL #4   Title stand and walk for 20 minutes without limitation  pt able to stand and wlak for 64minutes   Time 8   Period Weeks   Status On-going   PT LONG TERM GOAL #5   Title report < or  = to 5/10 LBP with standing and walking   Time 8   Period Weeks   Status Achieved               Plan - 07/23/14 1008    Clinical Impression Statement Pt with less pelvic shift to left, pt will have MRI due to constant bronchitis and fluid in his lungs, pt with coughing thru PT session   Pt will benefit from skilled therapeutic intervention in order to improve on the following deficits Decreased range of motion;Difficulty walking;Improper body mechanics;Decreased endurance;Decreased activity tolerance;Pain;Decreased mobility   Rehab Potential Good   PT Frequency 2x / week   PT Duration 8 weeks   PT Treatment/Interventions ADLs/Self Care Home Management;Moist Heat;Therapeutic activities;Therapeutic exercise;Ultrasound;Manual techniques;Cryotherapy;Neuromuscular re-education;Electrical Stimulation   PT Next Visit Plan D/C next visti, due to eye surgery coming up   PT Home  Exercise Plan continue with current exercise program   Consulted and Agree with Plan of Care Patient        Problem List Patient Active Problem List   Diagnosis Date Noted  .  CHF with left ventricular diastolic dysfunction, NYHA class 2 01/28/2014  . PMR (polymyalgia rheumatica) 09/09/2013  . COPD exacerbation 08/02/2012  . Rheumatoid arthritis(714.0) 04/13/2012  . CAD (coronary artery disease) 12/19/2011  . Diabetes mellitus, type 2 12/19/2011  . Neuropathy 12/19/2011    NAUMANN-HOUEGNIFIO,Batul Diego PTA 07/23/2014, 10:14 AM  [facility] [address], [place], [place], [address] Phone: [phone]   Fax:  [phone]

## 2014-07-25 ENCOUNTER — Ambulatory Visit: Payer: Medicare Other | Admitting: Family Medicine

## 2014-07-26 ENCOUNTER — Encounter: Payer: Self-pay | Admitting: Physical Therapy

## 2014-07-26 ENCOUNTER — Ambulatory Visit: Payer: Non-veteran care | Admitting: Physical Therapy

## 2014-07-26 DIAGNOSIS — M25659 Stiffness of unspecified hip, not elsewhere classified: Secondary | ICD-10-CM

## 2014-07-26 DIAGNOSIS — M545 Low back pain, unspecified: Secondary | ICD-10-CM

## 2014-07-26 DIAGNOSIS — R6889 Other general symptoms and signs: Secondary | ICD-10-CM

## 2014-07-26 NOTE — Therapy (Signed)
[facility] [address] [place], [place], [address] Phone: [phone]   Fax:  [phone]  Physical Therapy Treatment  Patient Details  Name: Jacob [name] MRN: [id number] Date of Birth: 1941/03/09 Referring Provider:  Desiree Hane, PA  Encounter Date: 07/26/2014      PT End of Session - 07/26/14 1603    Visit Number 10   Date for PT Re-Evaluation 07/26/14   Authorization Type VA   Authorization Time Period 05/28/14-07/27/14   Authorization - Number of Visits 12   PT Start Time 3578   PT Stop Time 1602   PT Time Calculation (min) 32 min   Activity Tolerance Patient tolerated treatment well   Behavior During Therapy [facility] for tasks assessed/performed      Past Medical History  Diagnosis Date  . Allergy   . Asthma   . Diabetes mellitus without complication   . Cataract   . Arthritis   . Polymyalgia rheumatica     maintained on Prednisone, Plaquenil. Followed by rhuematology every 4 months/[name].  Marland Kitchen COPD (chronic obstructive pulmonary disease)   . Anemia   . Hypertension   . BPH (benign prostatic hyperplasia)     Past Surgical History  Procedure Laterality Date  . Appendectomy    . Hernia repair    . Coronary artery bypass graft  03/17/1995    Wynonia Lawman; followed every six months.  . Prostate surgery      TURP at [facility].    There were no vitals filed for this visit.  Visit Diagnosis:  Right-sided low back pain without sciatica  Activity intolerance  Hip stiffness, unspecified laterality      Subjective Assessment - 07/26/14 1532    Subjective June 6th I will have eye surgery.     Pertinent History no surgical history.  Pt reports that he has been sick and inactive due to breathing difficulties over the past 6 weeks.   Limitations Sitting;Standing   How long can you stand comfortably? 10 minutes   How long can you walk comfortably? 10 minutes, when using walker or grocery store pt can walk 59mnutes  ?[facility]  Diagnostic tests Pt reports stenosis and DDD- imaging done 3-4 years ago per pt report   Patient Stated Goals stand and walk longer without rest, reduce pain   Currently in Pain? No/denies            [facility] - [facility]    Assessment   Medical Diagnosis LBP (M54.5)   Onset Date 05/28/07  chronic since 1976   Observation/Other Assessments   Focus on Therapeutic Outcomes (FOTO)  53%limitations   AROM   Overall AROM Comments lumbar ext decreased by 90% due to pain, and left sidebending decreased by 25%   Strength   Overall Strength Comments bil. hip flexion 5/5, bil. knee 5/5                     [facility] Adult PT Treatment/Exercise - 07/26/14 0001    Lumbar Exercises: Stretches   Active Hamstring Stretch 3 reps;20 seconds  standing at stairs, and sitting   Single Knee to Chest Stretch 3 reps;20 seconds   Double Knee to Chest Stretch 3 reps;20 seconds   Lower Trunk Rotation 3 reps;20 seconds   Lumbar Exercises: Aerobic   UBE (Upper Arm Bike) 6 (3/3) L 2   Lumbar Exercises: Standing   Other Standing Lumbar Exercises Mc Kenzie lat shift to  R 1x10 with 5sec hold   Lumbar Exercises: Seated   Other Seated Lumbar Exercises shoulder horizontal abd, diagonal, bil. shoulder flexion with red band 10 times each   Knee/Hip Exercises: Standing   Walking with Sports Cord 25# fwd/reverse and bkwdr/reverse 10x                PT Education - 29-Jul-2014 1603    Education provided Yes   Education Details reviewed patient HEP and her verbally understands   Person(s) Educated Patient   Methods Explanation   Comprehension Verbalized understanding          PT Short Term Goals - 07/23/14 1004    PT SHORT TERM GOAL #1   Title be independent in initial HEP   Time 4   Period Weeks   Status Achieved   PT SHORT TERM GOAL #2   Title stand and walk for 10 minutes without limitations   Time 4   Period Weeks   Status Achieved   PT SHORT  TERM GOAL #3   Title report  < or = to 6/10 LBP with standing and walking   Time 4   Period Weeks   Status Achieved           PT Long Term Goals - 07-29-2014 1545    PT LONG TERM GOAL #1   Title be independent in final HEP   Time 8   Period Weeks   Status Achieved   PT LONG TERM GOAL #2   Title reduce FOTO to < or = to 47% limitation   Time 8   Period Weeks   Status Not Met  53% limitation   PT LONG TERM GOAL #4   Title stand and walk for 20 minutes without limitation   Time 8   Period Weeks   Status Not Met  15 min due to back pain   PT LONG TERM GOAL #5   Title report < or  = to 5/10 LBP with standing and walking   Period Weeks   Status Achieved               Plan - 2014-07-29 1604    Clinical Impression Statement Patient is a 74 year old male with right sided low back pain without sciatica.  Patient has no change with his trunk ROM.  Patient has increased bil. hip and knee strength to 5/5.  Patient has met LTG number 1,and %.  Patient is only able to walk and stand for 15 min due to pain.  Patient FOTO score is 53% limitation instead of 47% limitation.  Patient wanted to be dischaged due to going on vacation then will have have Cataract surgery on the left then 3 weeks later on the right.    Pt will benefit from skilled therapeutic intervention in order to improve on the following deficits Decreased range of motion;Difficulty walking;Improper body mechanics;Decreased endurance;Decreased activity tolerance;Pain;Decreased mobility   Rehab Potential Good   PT Treatment/Interventions ADLs/Self Care Home Management;Moist Heat;Therapeutic activities;Therapeutic exercise;Ultrasound;Manual techniques;Cryotherapy;Neuromuscular re-education;Electrical Stimulation   PT Next Visit Plan Discharge to HEP   PT Home Exercise Plan HEP   Consulted and Agree with Plan of Care Patient          G-Codes - 07-29-14 1535    Functional Assessment Tool Used 53% FOTO limitation    Functional Limitation Other PT subsequent   Other PT Secondary Goal Status (C7893) At least 40 percent but less than 60 percent impaired, limited or restricted  Other PT Secondary Discharge Status [id number]) At least 40 percent but less than 60 percent impaired, limited or restricted      Problem List Patient Active Problem List   Diagnosis Date Noted  . CHF with left ventricular diastolic dysfunction, NYHA class 2 01/28/2014  . PMR (polymyalgia rheumatica) 09/09/2013  . COPD exacerbation 08/02/2012  . Rheumatoid arthritis(714.0) 04/13/2012  . CAD (coronary artery disease) 12/19/2011  . Diabetes mellitus, type 2 12/19/2011  . Neuropathy 12/19/2011    Andrell Bergeson,PT 07/26/2014, 4:13 PM  [facility] [address], [place], [place], [address] Phone: [phone]   Fax:  [phone] PHYSICAL THERAPY DISCHARGE SUMMARY  Visits from Start of Care: 10  Current functional level related to goals / functional outcomes: See above goals for update.  Patient has met LTG # 1 and 5.    Remaining deficits: Patient can only stand or walk for 15 min.  Patient FOTO score is 53% limitation instead of 47% limitation.    Education / Equipment: HEP Plan: Patient agrees to discharge.  Patient goals were partially met. Patient is being discharged due to the patient's request.  Thank you for the referral. Earlie Counts, PT 07/26/2014 4:11 PM ?????

## 2014-08-06 ENCOUNTER — Encounter: Payer: Non-veteran care | Admitting: Physical Therapy

## 2014-08-09 ENCOUNTER — Encounter: Payer: Non-veteran care | Admitting: Physical Therapy

## 2014-11-21 ENCOUNTER — Other Ambulatory Visit: Payer: Self-pay | Admitting: Ophthalmology

## 2014-11-21 MED ORDER — TETRACAINE HCL 0.5 % OP SOLN: 1.0000 [drp] | OPHTHALMIC | Status: AC

## 2014-11-21 NOTE — H&P (Signed)
History & Physical:   DATE:   11-16-14  NAME:  Jacob [name], Jacob [name]      [id number]       HISTORY OF PRESENT ILLNESS: Chief Eye Complaints blurry vision  OD    BS 134mg % this am. POST OP  phaco emulsion cataract extraction  with  intraocular lens implant OS  .  Va is not better, c/o vision is blurry per pt. Patient c/o OU itching and watering. Patient has been using artificial tears QID OU.     HPI: EYES: Reports symptoms of vision disturbances.        LOCATION:   BOTH EYES        QUALITY/COURSE:   Reports condition is worsening.        INTENSITY/SEVERITY:    Reports measurement ( or degree) as mild.      DURATION:   Reports the general length of symptoms to be years.                  ACTIVE PROBLEMS: Dry eye syndrome   ICD10: H04.129  ICD9: 375.15  Onset: 09/11/2014 11:00   Benign prostatic hypertrophy with urinary obstruction   ICD10: N40.1  ICD9: 600.01  Onset: 07/26/2014 10:31  Initial Date:    Polymyalgia rheumatica   ICD10:   ICD9: 725  Onset: 07/26/2014 10:31  Initial Date:    Asthma, intrinsic, stable   ICD10: J45.909  ICD9: 493.10  Onset: 07/26/2014 09:37  Initial Date: 09/11/2014  11:00    Diabetes, Type 2  ICD10: E11.9  ICD9: 250.00  Onset: 07/26/2014 08:44  Initial Date:   Benign hypertension   ICD10: I10  ICD9: 401.1  Onset: 07/26/2014 08:44  Initial Date:    Starter - Active Problems:  SURGERIES: 08/20/14 CE w IOL OS SLT OD 2010  MEDICATIONS: Furosemide (Lasix):   20 mg tablet  SIG-  1 each    in the AM     Simvastatin (Zocor):    40 mg tablet   SIG-   1 each     once a day       Claritin (Loratadine):    10 mg tablet    SIG-   1 each     once a day as needed for allergies              Singulair (Montelukast):   4 mg granule  SIG-  1 tab(s)   once a day (in the evening)    Symbicort  (Budesonide + Formoterol):   160 mcg-4.5 mcg/inh (aerosol with adapter)  SIG-  2 puff(s)   2 times a day    Albuterol (Proventil, Ventolin)  2 mg tablet  SIG-  1 each    every 6 hours    Ipratropium Bromide Nasal: Strength-  SIG-  Dose-  Freq-     Ceftin (Cefuroxime):   250 mg tablet  SIG-  1 tab(s)   2 times a day    Amlodipine (Norvasc):   10 mg tablet  SIG-  1 each   once a day    Losartan Potassium: Strength-  SIG-  Dose-  Freq-     Metformin (Glucophage):   500 mg tablet  SIG-  1 each    2 times a day     Metoprolol  (Lopressor):   50 mg tablet  SIG-  1 each   2 times a day  Allopurinol (Zyloprim):   100 mg tablet  SIG-  1 each    once a day     Aspirin:  81 mg tablet  SIG-  1 each   once a day    Prednisone (Deltasone/Orasone): 10 mg tablet  SIG-  dose pak tab(s)   Freq-     Glipizide (Glucotrol):   5 mg tablet  SIG-  1 each    2 times a day     NovoLog (Insulin) FlexPen:  100 units/mL solution  SIG-   as directed   3 times a day   Finasteride, 5 mg Performed Date/Time: 07/26/2014 08:49 [id number] Ordering Provider: Marylynn Pearson, Jr. Related Dxs-  Order Due:  Consultant:  Modifiers-    Flomax (Tamsulosin):   0.4 mg capsule  SIG-  1 each   once a day    Gabapentin: 300 mg capsule SIG-  Dose-  Freq- ROS:   GEN- Constitutional: HENT: GEN - Endocrine: Reports symptoms of diabetes.    LUNGS/Respiratory:  HEART/Cardiovascular: Reports symptoms of hypertension.    ABD/Gastrointestinal:  GU  Musculoskeletal (BJE): NEURO/Neurological: PSYCH/Psychiatric:    Is the pt oriented to time, place, person? YES   Mood normal     TOBACCO: Never smoker   ICD10: Z87.898 ICD9: V13.89 Onset: 07/26/2014 08:56 Initial Date:   Tobacco use:     Tobacco cessation:  SOCIAL HISTORY: Automotive engineer List - Social History  FAMILY HISTORY   Family History - 1st Degree Relatives:  Son alive and well.  ALLERGIES:Drug Allergies.  No Known.    ADMITTING DIAGNOSIS: Combined forms of age-related cataract, right eye   ICD10: H25.811  ICD9:   Onset: 07/26/2014 10:25  Initial Date:   POOR PUPIL DILATION ON FLOMAX  Puckering of macula, left eye   ICD10: H35.372   ICD9:   Onset: 10/02/2014 10:11  Initial Date:    Primary open-angle glaucoma, moderate stage   ICD10: H40.11X2  ICD9:   Onset: 09/11/2014 12:51  Initial Date:    Pseudophakia   ICD10: Z96.1 ICD9: 446.0 Onset: 08/28/2014 09:59   Dry eye syndrome of bilateral lacrimal glands   ICD10: H04.123  ICD9:   Onset: 07/26/2014 10:43  Initial Date:    Polymyalgia rheumatica   ICD10:   ICD9: 725  Onset: 07/26/2014 10:30  Initial Date:      Diabetes, Type 2  ICD10: E11.9  ICD9: 250.00  Onset: 07/26/2014 10:40  Initial Date:  SURGICAL TREATMENT PLAN: phaco emulsion cataract extraction   intraocular lens implant  OD  Risk and benefits of surgery have been reviewed with the patient and the patient agrees to proceed with the surgical procedure.      refered  to a retina specialist for macula pucker OS      REFRACTION today   start alphagan p  OU TID   GREY TOP USE 2 TIMES A DAY IN THE LEFT EYE discontinue when out  PINK TOP 2 TIMES A DAY IN THE LEFT EYE discontinue when out   STOP GTTS AS THEY RUN OUT    Actions:   Handouts: New Handout, What is a cataract?.    ___________________________ Marylynn Pearson, Brooke Bonito Starter - Inactive Problems:

## 2014-11-26 ENCOUNTER — Encounter (HOSPITAL_COMMUNITY): Payer: Self-pay

## 2014-11-26 ENCOUNTER — Encounter (HOSPITAL_COMMUNITY)
Admission: RE | Admit: 2014-11-26 | Discharge: 2014-11-26 | Disposition: A | Discharge status: Home / Self Care | Payer: No Typology Code available for payment source | Source: Ambulatory Visit | Attending: Ophthalmology | Admitting: Ophthalmology

## 2014-11-26 DIAGNOSIS — H04123 Dry eye syndrome of bilateral lacrimal glands: Secondary | ICD-10-CM | POA: Diagnosis not present

## 2014-11-26 DIAGNOSIS — E119 Type 2 diabetes mellitus without complications: Secondary | ICD-10-CM | POA: Diagnosis not present

## 2014-11-26 DIAGNOSIS — Z951 Presence of aortocoronary bypass graft: Secondary | ICD-10-CM | POA: Diagnosis not present

## 2014-11-26 DIAGNOSIS — Z794 Long term (current) use of insulin: Secondary | ICD-10-CM | POA: Diagnosis not present

## 2014-11-26 DIAGNOSIS — Z7982 Long term (current) use of aspirin: Secondary | ICD-10-CM | POA: Diagnosis not present

## 2014-11-26 DIAGNOSIS — I10 Essential (primary) hypertension: Secondary | ICD-10-CM | POA: Diagnosis not present

## 2014-11-26 DIAGNOSIS — H269 Unspecified cataract: Secondary | ICD-10-CM | POA: Diagnosis present

## 2014-11-26 DIAGNOSIS — I2510 Atherosclerotic heart disease of native coronary artery without angina pectoris: Secondary | ICD-10-CM | POA: Diagnosis not present

## 2014-11-26 DIAGNOSIS — H268 Other specified cataract: Secondary | ICD-10-CM | POA: Diagnosis not present

## 2014-11-26 HISTORY — DX: Inflammatory liver disease, unspecified: K75.9

## 2014-11-26 HISTORY — DX: Chronic kidney disease, unspecified: N18.9

## 2014-11-26 HISTORY — DX: Anxiety disorder, unspecified: F41.9

## 2014-11-26 HISTORY — DX: Sleep apnea, unspecified: G47.30

## 2014-11-26 HISTORY — DX: Atherosclerotic heart disease of native coronary artery without angina pectoris: I25.10

## 2014-11-26 HISTORY — DX: Reserved for inherently not codable concepts without codable children: IMO0001

## 2014-11-26 LAB — CBC
HCT: 45.8 % (ref 39.0–52.0)
Hemoglobin: 15 g/dL (ref 13.0–17.0)
MCH: 30.9 pg (ref 26.0–34.0)
MCHC: 32.8 g/dL (ref 30.0–36.0)
MCV: 94.4 fL (ref 78.0–100.0)
Platelets: 176 10*3/uL (ref 150–400)
RBC: 4.85 MIL/uL (ref 4.22–5.81)
RDW: 15.6 % — ABNORMAL HIGH (ref 11.5–15.5)
WBC: 11.4 10*3/uL — ABNORMAL HIGH (ref 4.0–10.5)

## 2014-11-26 LAB — COMPREHENSIVE METABOLIC PANEL
ALT: 18 U/L (ref 17–63)
AST: 16 U/L (ref 15–41)
Albumin: 3.6 g/dL (ref 3.5–5.0)
Alkaline Phosphatase: 84 U/L (ref 38–126)
Anion gap: 9 (ref 5–15)
BUN: 19 mg/dL (ref 6–20)
CO2: 26 mmol/L (ref 22–32)
Calcium: 9.7 mg/dL (ref 8.9–10.3)
Chloride: 108 mmol/L (ref 101–111)
Creatinine, Ser: 1.27 mg/dL — ABNORMAL HIGH (ref 0.61–1.24)
GFR calc Af Amer: >60 mL/min (ref >60)
GFR calc non Af Amer: 54 mL/min — ABNORMAL LOW (ref >60)
Glucose, Bld: 93 mg/dL (ref 65–99)
Potassium: 4.8 mmol/L (ref 3.5–5.1)
Sodium: 143 mmol/L (ref 135–145)
Total Bilirubin: 0.7 mg/dL (ref 0.3–1.2)
Total Protein: 7.1 g/dL (ref 6.5–8.1)

## 2014-11-26 LAB — GLUCOSE, CAPILLARY: Glucose-Capillary: 94 mg/dL (ref 65–99)

## 2014-11-26 NOTE — Progress Notes (Signed)
Pt. Stated that he last saw Dr. Wynonia Lawman about 3 months ago. Requested OV, EKG and cardiac testing. Received information from Dr. Thurman Coyer office and last OV was November 2015.  Called office to verify and they stated he had an appointment  Scheduled in May but he cancelled it.

## 2014-11-26 NOTE — Pre-Procedure Instructions (Signed)
MEGUEL HELMING  11/26/2014      [id number] [place], [place] [facility] [place] [address] [place] [address] Phone: [phone] Fax: [phone]    Your procedure is scheduled on 11-28-2014  Wednesday   Report to [facility] Admitting at 9:30 A.M.   Call this number if you have problems the morning of surgery:  [phone]   Remember:  Do not eat food or drink liquids after midnight.   Take these medicines the morning of surgery with A SIP OF WATER Albuterol inhaler if needed,veramyst nasal spray   Why is it important to control my blood sugar before and after surgery?   Improving blood sugar levels before and after surgery helps healing and can limit problems.  A way of improving blood sugar control is eating a healthy diet by:  - Eating less sugar and carbohydrates  - Increasing activity/exercise  - Talk with your doctor about reaching your blood sugar goals  High blood sugars (greater than 180 mg/dL) can raise your risk of infections and slow down your recovery so you will need to focus on controlling your diabetes during the weeks before surgery.  Make sure that the doctor who takes care of your diabetes knows about your planned surgery including the date and location.  How do I manage my blood sugars before surgery?   Check your blood sugar at least 4 times a day, 2 days before surgery to make sure that they are not too high or low.   Check your blood sugar the morning of your surgery when you wake up and every 2   hours until you get to the Short-Stay unit.  If your blood sugar is less than 70 mg/dL, you will need to treat for low blood sugar by:  Treat a low blood sugar (less than 70 mg/dL) with 1/2 cup of clear juice (cranberry or apple), 4 glucose tablets, OR glucose gel.  Recheck blood sugar in 15 minutes after treatment (to make sure it is greater than 70 mg/dL).  If blood sugar is not greater than 70 mg/dL on re-check, call  [phone] for further instructions.   Report your blood sugar to the Short-Stay nurse when you get to Short-Stay.  References:  [facility], 2007 "How to Manage your Diabetes Before and After Surgery".  What do I do about my diabetes medications?   Do not take oral diabetes medicines (pills) the morning of surgery.    THE NIGHT BEFORE SURGERY, take 42 units of  Novolog 70/30 Insulin.    THE MORNING OF SURGERY, take 0 units of Novolog 70/30 Insulin.    Do not take other diabetes injectables the day of surgery including Byetta, Victoza, Bydureon, and Trulicity.       For patients with "Insulin Pumps":  Contact your diabetes doctor for specific instructions before surgery.   Decrease basal insulin rates by 20% at midnight the night before surgery.  Note that if your surgery is planned to be longer than 2 hours, your insulin pump will be removed and intravenous (IV) insulin will be started and managed by the nurses and anesthesiologist.  You will be able to restart your insulin pump once you are awake and able to manage it.  Make sure to bring insulin pump supplies to the hospital with you in case your site needs to be changed.           Do not wear jewelry,  Do not wear lotions, powders, or perfumes.  You may not wear deodorant.  Do not shave 48 hours prior to surgery.  Men may shave face and neck.  Do not bring valuables to the hospital.   [facility] is not responsible for any belongings or valuables.  Contacts, dentures or bridgework may not be worn into surgery.  Leave your suitcase in the car.  After surgery it may be brought to your room.    Patients discharged the day of surgery will not be allowed to drive home.    Special instructions:  See attached sheet "Preparing for Surgery" for instructions on CHG shower      Please read over the following fact sheets that you were given. Pain Booklet and Surgical Site  Infection Prevention

## 2014-11-27 LAB — HEMOGLOBIN A1C
Hgb A1c MFr Bld: 7.6 % — ABNORMAL HIGH (ref 4.8–5.6)
Mean Plasma Glucose: 171 mg/dL

## 2014-11-27 NOTE — Progress Notes (Signed)
I spoke with Jacob [name] and informed him of time change and new arrival time of 0800,  Patient stated he will be here.

## 2014-11-27 NOTE — Progress Notes (Signed)
Anesthesia Chart Review: Patient is a 74 year old male scheduled for right eye cataract surgery on 11/28/14 by Dr. Venetia Maxon.  History includes former smoker, CAD s/p CABG ~ '97, DM2, asthma, polymyalgia rheumatica, COPD, anemia, BPH s/p TURP, HTN, OSA, exertional dyspnea, hepatitis (type not specified), CKD, hernia repair. PCP is listed as Dr. [name].  Meds include prednisone, Zocor, Kdur, Lopressor, Singulair, Victoza, lovastatin, losartan, Atrovent, Novology 70/30, Novolin R, glipizide, Neurontin, Lasix, Zyrtec, amlodipine, albuterol, Symbicort, Proscar, Advair.  Cardiologist is Dr. Wynonia Lawman, last visit 01/22/14.   12/14/13 Nuclear stress test (Dr. Wynonia Lawman): normal Lexiscan Myoview scan with no evidence of ischemia or infarction. Mildly reduced quantitative gated SPECT EF of 48% with mild global hypokinesis. Low risk scan. Continue medical management recommended.   12/11/13 Echo (Dr. Wynonia Lawman): moderate concentric LVH with lower limits of normal systolic function, EF [id number]. Moderate LAE. Doppler evidence of grade 2 diastolic dysfunction. Trivial pericardial effusion.   By cardiology notes, 11/2013 event monitor showed PVCs and PACs.  03/29/14 EKG: SR with sinus arrhythmia.   03/29/14 CXR: IMPRESSION: Cardiomegaly without acute cardiopulmonary disease.  Preoperative labs noted. A1C 7.6.   He had cardiology follow-up with testing within the past year. If no acute changes or new CV symptomology then I would anticipate that he could proceed as planned.  [name] Hugh [facility] Short Stay Center/Anesthesiology Phone [phone] 11/27/2014 11:44 AM

## 2014-11-28 ENCOUNTER — Ambulatory Visit (HOSPITAL_COMMUNITY): Payer: No Typology Code available for payment source | Admitting: Vascular Surgery

## 2014-11-28 ENCOUNTER — Ambulatory Visit (HOSPITAL_COMMUNITY)
Admission: RE | Admit: 2014-11-28 | Discharge: 2014-11-28 | Disposition: A | Discharge status: Home / Self Care | Payer: No Typology Code available for payment source | Source: Ambulatory Visit | Attending: Ophthalmology | Admitting: Ophthalmology

## 2014-11-28 ENCOUNTER — Ambulatory Visit (HOSPITAL_COMMUNITY): Payer: No Typology Code available for payment source | Admitting: Certified Registered"

## 2014-11-28 ENCOUNTER — Encounter (HOSPITAL_COMMUNITY)
Admission: RE | Disposition: A | Discharge status: Home / Self Care | Payer: Self-pay | Source: Ambulatory Visit | Attending: Ophthalmology

## 2014-11-28 ENCOUNTER — Encounter (HOSPITAL_COMMUNITY): Payer: Self-pay | Admitting: *Deleted

## 2014-11-28 DIAGNOSIS — H268 Other specified cataract: Secondary | ICD-10-CM | POA: Insufficient documentation

## 2014-11-28 DIAGNOSIS — Z951 Presence of aortocoronary bypass graft: Secondary | ICD-10-CM | POA: Insufficient documentation

## 2014-11-28 DIAGNOSIS — I10 Essential (primary) hypertension: Secondary | ICD-10-CM | POA: Insufficient documentation

## 2014-11-28 DIAGNOSIS — E119 Type 2 diabetes mellitus without complications: Secondary | ICD-10-CM | POA: Insufficient documentation

## 2014-11-28 DIAGNOSIS — Z794 Long term (current) use of insulin: Secondary | ICD-10-CM | POA: Insufficient documentation

## 2014-11-28 DIAGNOSIS — I2510 Atherosclerotic heart disease of native coronary artery without angina pectoris: Secondary | ICD-10-CM | POA: Insufficient documentation

## 2014-11-28 DIAGNOSIS — Z7982 Long term (current) use of aspirin: Secondary | ICD-10-CM | POA: Insufficient documentation

## 2014-11-28 DIAGNOSIS — H04123 Dry eye syndrome of bilateral lacrimal glands: Secondary | ICD-10-CM | POA: Insufficient documentation

## 2014-11-28 HISTORY — PX: CATARACT EXTRACTION W/PHACO: SHX586

## 2014-11-28 HISTORY — DX: Malignant neoplasm of unspecified kidney, except renal pelvis: C64.9

## 2014-11-28 LAB — GLUCOSE, CAPILLARY
Glucose-Capillary: 159 mg/dL — ABNORMAL HIGH (ref 65–99)
Glucose-Capillary: 111 mg/dL — ABNORMAL HIGH (ref 65–99)

## 2014-11-28 SURGERY — PHACOEMULSIFICATION, CATARACT, WITH IOL INSERTION
Anesthesia: Monitor Anesthesia Care | Site: Eye | Laterality: Right

## 2014-11-28 MED ORDER — EPINEPHRINE HCL 1 MG/ML IJ SOLN
INTRAMUSCULAR | Status: AC
  Filled 2014-11-28: qty 1

## 2014-11-28 MED ORDER — ACETYLCHOLINE CHLORIDE 20 MG IO SOLR
INTRAOCULAR | Status: AC
  Filled 2014-11-28: qty 1

## 2014-11-28 MED ORDER — FENTANYL CITRATE (PF) 250 MCG/5ML IJ SOLN
INTRAMUSCULAR | Status: AC
  Filled 2014-11-28: qty 5

## 2014-11-28 MED ORDER — LIDOCAINE-EPINEPHRINE 2 %-1:100000 IJ SOLN
INTRAMUSCULAR | Status: AC
  Filled 2014-11-28: qty 1

## 2014-11-28 MED ORDER — BSS IO SOLN
INTRAOCULAR | Status: AC
  Filled 2014-11-28: qty 15

## 2014-11-28 MED ORDER — NA CHONDROIT SULF-NA HYALURON 40-30 MG/ML IO SOLN
INTRAOCULAR | Status: AC
  Filled 2014-11-28: qty 0.5

## 2014-11-28 MED ORDER — 0.9 % SODIUM CHLORIDE (POUR BTL) OPTIME
TOPICAL | Status: DC | PRN
  Administered 2014-11-28: 200 mL

## 2014-11-28 MED ORDER — LIDOCAINE HCL 2 % IJ SOLN
INTRAMUSCULAR | Status: AC
  Filled 2014-11-28: qty 20

## 2014-11-28 MED ORDER — TOBRAMYCIN 0.3 % OP OINT
TOPICAL_OINTMENT | OPHTHALMIC | Status: DC | PRN
  Administered 2014-11-28: 1 via OPHTHALMIC

## 2014-11-28 MED ORDER — HYDROMORPHONE HCL 1 MG/ML IJ SOLN: 0.2500 mg | INTRAMUSCULAR | Status: DC | PRN

## 2014-11-28 MED ORDER — BSS IO SOLN
INTRAOCULAR | Status: AC
  Filled 2014-11-28: qty 500

## 2014-11-28 MED ORDER — FENTANYL CITRATE (PF) 100 MCG/2ML IJ SOLN
INTRAMUSCULAR | Status: DC | PRN
  Administered 2014-11-28: 50 ug via INTRAVENOUS

## 2014-11-28 MED ORDER — OXYCODONE-ACETAMINOPHEN 5-325 MG PO TABS
1.0000 | ORAL_TABLET | ORAL | Status: AC | PRN
  Administered 2014-11-28: 1 via ORAL

## 2014-11-28 MED ORDER — PROPOFOL 10 MG/ML IV BOLUS
INTRAVENOUS | Status: AC
  Filled 2014-11-28: qty 20

## 2014-11-28 MED ORDER — PROPOFOL INFUSION 10 MG/ML OPTIME
INTRAVENOUS | Status: DC | PRN
  Administered 2014-11-28: 50 ug/kg/min via INTRAVENOUS

## 2014-11-28 MED ORDER — GATIFLOXACIN 0.5 % OP SOLN
1.0000 [drp] | OPHTHALMIC | Status: AC | PRN
  Administered 2014-11-28 (×3): 1 [drp] via OPHTHALMIC
  Filled 2014-11-28: qty 2.5

## 2014-11-28 MED ORDER — NA CHONDROIT SULF-NA HYALURON 40-30 MG/ML IO SOLN
INTRAOCULAR | Status: DC | PRN
  Administered 2014-11-28: 0.5 mL via INTRAOCULAR

## 2014-11-28 MED ORDER — PHENYLEPHRINE HCL 2.5 % OP SOLN
1.0000 [drp] | OPHTHALMIC | Status: AC
  Administered 2014-11-28 (×3): 1 [drp] via OPHTHALMIC
  Filled 2014-11-28: qty 2

## 2014-11-28 MED ORDER — SODIUM HYALURONATE 10 MG/ML IO SOLN
INTRAOCULAR | Status: AC
  Filled 2014-11-28: qty 0.85

## 2014-11-28 MED ORDER — METOPROLOL TARTRATE 12.5 MG HALF TABLET
ORAL_TABLET | ORAL | Status: AC
  Filled 2014-11-28: qty 2

## 2014-11-28 MED ORDER — BUPIVACAINE HCL (PF) 0.75 % IJ SOLN
INTRAMUSCULAR | Status: AC
  Filled 2014-11-28: qty 10

## 2014-11-28 MED ORDER — TOBRAMYCIN-DEXAMETHASONE 0.3-0.1 % OP OINT
TOPICAL_OINTMENT | OPHTHALMIC | Status: AC
  Filled 2014-11-28: qty 3.5

## 2014-11-28 MED ORDER — PROMETHAZINE HCL 25 MG/ML IJ SOLN: 6.2500 mg | INTRAMUSCULAR | Status: DC | PRN

## 2014-11-28 MED ORDER — LIDOCAINE-EPINEPHRINE 2 %-1:100000 IJ SOLN
INTRAMUSCULAR | Status: DC | PRN
  Administered 2014-11-28: 11:00:00 via RETROBULBAR

## 2014-11-28 MED ORDER — OXYCODONE-ACETAMINOPHEN 5-325 MG PO TABS
ORAL_TABLET | ORAL | Status: AC
  Administered 2014-11-28: 1 via ORAL
  Filled 2014-11-28: qty 1

## 2014-11-28 MED ORDER — STERILE WATER FOR IRRIGATION IR SOLN
Status: DC | PRN
  Administered 2014-11-28: 200 mL

## 2014-11-28 MED ORDER — EPINEPHRINE HCL 1 MG/ML IJ SOLN
INTRAOCULAR | Status: DC | PRN
  Administered 2014-11-28: 10:00:00

## 2014-11-28 MED ORDER — ROCURONIUM BROMIDE 50 MG/5ML IV SOLN
INTRAVENOUS | Status: AC
  Filled 2014-11-28: qty 1

## 2014-11-28 MED ORDER — SODIUM HYALURONATE 10 MG/ML IO SOLN
INTRAOCULAR | Status: DC | PRN
  Administered 2014-11-28: 0.85 mL via INTRAOCULAR

## 2014-11-28 MED ORDER — GENTAMICIN SULFATE 40 MG/ML IJ SOLN
INTRAMUSCULAR | Status: AC
  Filled 2014-11-28: qty 2

## 2014-11-28 MED ORDER — HYALURONIDASE HUMAN 150 UNIT/ML IJ SOLN
INTRAMUSCULAR | Status: AC
  Filled 2014-11-28: qty 1

## 2014-11-28 MED ORDER — OXYCODONE HCL 5 MG PO TABS: 5.0000 mg | ORAL_TABLET | ORAL | Status: DC | PRN

## 2014-11-28 MED ORDER — TETRACAINE HCL 0.5 % OP SOLN
OPHTHALMIC | Status: AC
  Filled 2014-11-28: qty 2

## 2014-11-28 MED ORDER — ACETYLCHOLINE CHLORIDE 20 MG IO SOLR
INTRAOCULAR | Status: DC | PRN
  Administered 2014-11-28: 20 mg via INTRAOCULAR

## 2014-11-28 MED ORDER — METOPROLOL TARTRATE 25 MG PO TABS
25.0000 mg | ORAL_TABLET | Freq: Once | ORAL | Status: AC
  Administered 2014-11-28: 25 mg via ORAL
  Filled 2014-11-28: qty 1

## 2014-11-28 MED ORDER — SODIUM CHLORIDE 0.9 % IV SOLN
INTRAVENOUS | Status: DC
  Administered 2014-11-28: 09:00:00 via INTRAVENOUS

## 2014-11-28 MED ORDER — CYCLOPENTOLATE HCL 1 % OP SOLN
1.0000 [drp] | OPHTHALMIC | Status: AC
  Administered 2014-11-28 (×3): 1 [drp] via OPHTHALMIC
  Filled 2014-11-28: qty 2

## 2014-11-28 MED ORDER — TROPICAMIDE 1 % OP SOLN
1.0000 [drp] | OPHTHALMIC | Status: AC
  Administered 2014-11-28 (×3): 1 [drp] via OPHTHALMIC
  Filled 2014-11-28: qty 3

## 2014-11-28 MED ORDER — DEXAMETHASONE SODIUM PHOSPHATE 10 MG/ML IJ SOLN
INTRAMUSCULAR | Status: AC
  Filled 2014-11-28: qty 1

## 2014-11-28 MED ORDER — LIDOCAINE HCL (CARDIAC) 20 MG/ML IV SOLN
INTRAVENOUS | Status: AC
  Filled 2014-11-28: qty 5

## 2014-11-28 MED ORDER — PHENYLEPHRINE 40 MCG/ML (10ML) SYRINGE FOR IV PUSH (FOR BLOOD PRESSURE SUPPORT)
PREFILLED_SYRINGE | INTRAVENOUS | Status: AC
  Filled 2014-11-28: qty 10

## 2014-11-28 MED ORDER — PILOCARPINE HCL 4 % OP SOLN
OPHTHALMIC | Status: AC
  Filled 2014-11-28: qty 15

## 2014-11-28 SURGICAL SUPPLY — 40 items
APL SRG 3 HI ABS STRL LF PLS (MISCELLANEOUS) ×1
APPLICATOR COTTON TIP 6IN STRL (MISCELLANEOUS) ×2 IMPLANT
APPLICATOR DR MATTHEWS STRL (MISCELLANEOUS) ×2 IMPLANT
BLADE KERATOME 2.75 (BLADE) ×2 IMPLANT
CANNULA ANTERIOR CHAMBER 27GA (MISCELLANEOUS) ×2 IMPLANT
CORDS BIPOLAR (ELECTRODE) IMPLANT
COVER MAYO STAND STRL (DRAPES) ×2 IMPLANT
DRAPE OPHTHALMIC 40X48 W POUCH (DRAPES) ×2 IMPLANT
DRAPE RETRACTOR (MISCELLANEOUS) ×2 IMPLANT
GLOVE BIO SURGEON STRL SZ8 (GLOVE) ×2 IMPLANT
GLOVE BIOGEL PI IND STRL 6.5 (GLOVE) IMPLANT
GLOVE BIOGEL PI IND STRL 7.0 (GLOVE) IMPLANT
GLOVE BIOGEL PI INDICATOR 6.5 (GLOVE) ×1
GLOVE BIOGEL PI INDICATOR 7.0 (GLOVE) ×1
GLOVE SURG SS PI 7.0 STRL IVOR (GLOVE) ×1 IMPLANT
GOWN STRL REUS W/ TWL LRG LVL3 (GOWN DISPOSABLE) ×2 IMPLANT
GOWN STRL REUS W/TWL LRG LVL3 (GOWN DISPOSABLE) ×4
KIT BASIN OR (CUSTOM PROCEDURE TRAY) ×2 IMPLANT
KIT ROOM TURNOVER OR (KITS) ×2 IMPLANT
LENS IOL ACRSF IQ PC 22.5 (Intraocular Lens) IMPLANT
LENS IOL ACRYSOF IQ POST 22.5 (Intraocular Lens) ×2 IMPLANT
NDL 18GX1X1/2 (RX/OR ONLY) (NEEDLE) ×1 IMPLANT
NDL 25GX 5/8IN NON SAFETY (NEEDLE) ×1 IMPLANT
NDL FILTER BLUNT 18X1 1/2 (NEEDLE) ×1 IMPLANT
NEEDLE 18GX1X1/2 (RX/OR ONLY) (NEEDLE) ×2 IMPLANT
NEEDLE 25GX 5/8IN NON SAFETY (NEEDLE) ×4 IMPLANT
NEEDLE FILTER BLUNT 18X 1/2SAF (NEEDLE) ×1
NEEDLE FILTER BLUNT 18X1 1/2 (NEEDLE) ×1 IMPLANT
NS IRRIG 1000ML POUR BTL (IV SOLUTION) ×2 IMPLANT
PACK CATARACT CUSTOM (CUSTOM PROCEDURE TRAY) ×2 IMPLANT
PAD ARMBOARD 7.5X6 YLW CONV (MISCELLANEOUS) ×2 IMPLANT
PAK PIK CVS CATARACT (OPHTHALMIC) ×2 IMPLANT
RING MALYGIN (MISCELLANEOUS) ×1 IMPLANT
SUT ETHILON 10 0 CS140 6 (SUTURE) ×1 IMPLANT
SUT SILK 6 0 G 6 (SUTURE) IMPLANT
SYR TB 1ML LUER SLIP (SYRINGE) ×2 IMPLANT
TIP ABS 45DEG FLARED 0.9MM (TIP) ×2 IMPLANT
TOWEL OR 17X26 10 PK STRL BLUE (TOWEL DISPOSABLE) ×2 IMPLANT
WATER STERILE IRR 1000ML POUR (IV SOLUTION) ×2 IMPLANT
WIPE INSTRUMENT VISIWIPE 73X73 (MISCELLANEOUS) ×2 IMPLANT

## 2014-11-28 NOTE — Discharge Instructions (Signed)
The patient should sleep on his back or left side avoid heavy lifting bending or straining. Not rub the eye. Sleep with the plastic eye she'll on the eye. He may remove the eye patch today at 4:00 PM. He should instilled the eyedrops that were given to him at the office this afternoon and use them again tomorrow morning.

## 2014-11-28 NOTE — Interval H&P Note (Signed)
History and Physical Interval Note:  11/28/2014 10:24 AM  Jacob [name]  has presented today for surgery, with the diagnosis of COMBINED FORMS OF AGE RELATED CATARAC  The various methods of treatment have been discussed with the patient and family. After consideration of risks, benefits and other options for treatment, the patient has consented to  Procedure(s): CATARACT EXTRACTION PHACO AND INTRAOCULAR LENS PLACEMENT ([place]) RIGHT  (Right) as a surgical intervention .  The patient's history has been reviewed, patient examined, no change in status, stable for surgery.  I have reviewed the patient's chart and labs.  Questions were answered to the patient's satisfaction.     [name]

## 2014-11-28 NOTE — Interval H&P Note (Signed)
History and Physical Interval Note:  11/28/2014 10:23 AM  Jacob [name]  has presented today for surgery, with the diagnosis of COMBINED FORMS OF AGE RELATED CATARAC  The various methods of treatment have been discussed with the patient and family. After consideration of risks, benefits and other options for treatment, the patient has consented to  Procedure(s): CATARACT EXTRACTION PHACO AND INTRAOCULAR LENS PLACEMENT ([place]) RIGHT  (Right) as a surgical intervention .  The patient's history has been reviewed, patient examined, no change in status, stable for surgery.  I have reviewed the patient's chart and labs.  Questions were answered to the patient's satisfaction.     Vaughan Garfinkle

## 2014-11-28 NOTE — Op Note (Signed)
Preoperative diagnosis: Visually significant complex cataract right eye inpatient on Flomax requires special devices to maintain pupillary dilation Postop diagnosis: Same Procedure: Phacoemulsification with intraocular lens implant Complications: None Anesthesia: 2% Xylocaine with epinephrine in a 50-50 mixture 0.75% Marcaine with ample [place] Assistant: Unknown Foley Procedure: The patient was transported to the operating room where he was given a peribulbar block with the aforementioned local and aesthetic agent. The patient's face was then prepped and draped in the usual sterile fashion. With the operating microscope in position in the surgeon sitting temporally a Weck-Cel sponges used to fixate the globe it was noted pupil was only dilated to 5 mm. A 15 blade was used to enter through superior clear cornea and Viscoat was injected in the anterior chamber with an additional Weck-Cel in place a 2.75 mm keratome blade was used in a stepwise fashion through temporal clear cornea to into the anterior chamber. The mother Luken ring was placed to expand the pupil to 6.25 mm, a bent 25-gauge needle was used to incise anterior capsule and a continuous tear curvilinear capsulorrhexis was formed. BSS was used to hydrodissect the hydrodelineate the nucleus. The nucleus was noted to move within the capsular bag it was noted that the patient's respirations where very deep causing the eye and told head to continue to move throughout the entire case adding additional potential, locations to the surgical procedure. The phacoemulsification unit was then used to remove the epinucleus and the nucleus was sculpted centrally the nucleus was divided into 3 quadrants and then the patient about removed his head all instruments were removed from the eye the Viscoat was injected to elevate the nucleus a fake emulsification unit was then used to remove all nuclear fragments following this the irrigation-aspiration device was then used to  strip cortical fibers from the posterior capsule as the patient continued to move his head back and forth the assistant had to rest her hand on the 4 head and attempted to keep the patient still. The intraocular lens implant was examined and noted to have no defects the lens was an Alcon AcrySof SN 110 WF IQ lens 22.5 dpt SN number [id number] the lens is placed in the lens injector and injected in the eye position with a Kuglen hook it was noted that there was iris prolapse at this point additional viscoelastic was injected the my Luken ring was removed the iris was redeposited within the eye 2 interrupted 10-0 nylon sutures were then placed to secure the incision BSS was injected in the eye as well as Miochol the viscoelastic was removed from the eye. The eye was pressurized and there being no leakage all instruments were removed. A patch and Fox U were placed after Tobradex ointment had been applied to the eye and the patient returned to recovery area in stable condition. Marylynn Pearson Junior M.D.

## 2014-11-28 NOTE — Anesthesia Preprocedure Evaluation (Addendum)
Anesthesia Evaluation  Patient identified by MRN, date of birth, ID band Patient awake    Reviewed: Allergy & Precautions, NPO status , Patient's Chart, lab work & pertinent test results  History of Anesthesia Complications Negative for: history of anesthetic complications  Airway Mallampati: II  TM Distance: >3 FB Neck ROM: Full    Dental  (+) Edentulous Upper, Edentulous Lower, Dental Advisory Given   Pulmonary asthma , sleep apnea and Continuous Positive Airway Pressure Ventilation , COPD, former smoker,    Pulmonary exam normal        Cardiovascular hypertension, Pt. on medications + CAD and + CABG  Normal cardiovascular exam  EF 50% 2015   Neuro/Psych PSYCHIATRIC DISORDERS Anxiety negative neurological ROS     GI/Hepatic Neg liver ROS, (+) Hepatitis -  Endo/Other  diabetes  Renal/GU Renal InsufficiencyRenal disease     Musculoskeletal   Abdominal   Peds  Hematology   Anesthesia Other Findings   Reproductive/Obstetrics                           Anesthesia Physical Anesthesia Plan  ASA: III  Anesthesia Plan: MAC   Post-op Pain Management:    Induction:   Airway Management Planned: Simple Face Mask  Additional Equipment:   Intra-op Plan:   Post-operative Plan:   Informed Consent: I have reviewed the patients History and Physical, chart, labs and discussed the procedure including the risks, benefits and alternatives for the proposed anesthesia with the patient or authorized representative who has indicated his/her understanding and acceptance.   Dental advisory given  Plan Discussed with: Anesthesiologist, Surgeon and CRNA  Anesthesia Plan Comments:         Anesthesia Quick Evaluation

## 2014-11-28 NOTE — Anesthesia Procedure Notes (Signed)
Procedure Name: MAC Date/Time: 11/28/2014 10:40 AM Performed by: Lavell Luster Pre-anesthesia Checklist: Patient identified, Emergency Drugs available, Suction available, Patient being monitored and Timeout performed Patient Re-evaluated:Patient Re-evaluated prior to inductionOxygen Delivery Method: Nasal cannula Preoxygenation: Pre-oxygenation with 100% oxygen Intubation Type: IV induction Placement Confirmation: positive ETCO2 Dental Injury: Teeth and Oropharynx as per pre-operative assessment

## 2014-11-28 NOTE — Transfer of Care (Signed)
Immediate Anesthesia Transfer of Care Note  Patient: Jacob [name]  Procedure(s) Performed: Procedure(s): CATARACT EXTRACTION PHACO AND INTRAOCULAR LENS PLACEMENT (IOC) RIGHT  (Right)  Patient Location: PACU  Anesthesia Type:MAC  Level of Consciousness: awake, alert  and oriented  Airway & Oxygen Therapy: Patient connected to nasal cannula oxygen  Post-op Assessment: Post -op Vital signs reviewed and stable  Post vital signs: stable  Last Vitals:  Filed Vitals:   11/28/14 0811  BP: 127/85  Pulse: 77  Temp: 36.5 C  Resp: 20    Complications: No apparent anesthesia complications

## 2014-11-28 NOTE — H&P (View-Only) (Signed)
History & Physical:   DATE:   11-16-14  NAME:  Jacob [name], Jacob [name]      [id number]       HISTORY OF PRESENT ILLNESS: Chief Eye Complaints blurry vision  OD    BS 134mg % this am. POST OP  phaco emulsion cataract extraction  with  intraocular lens implant OS  .  Va is not better, c/o vision is blurry per pt. Patient c/o OU itching and watering. Patient has been using artificial tears QID OU.     HPI: EYES: Reports symptoms of vision disturbances.        LOCATION:   BOTH EYES        QUALITY/COURSE:   Reports condition is worsening.        INTENSITY/SEVERITY:    Reports measurement ( or degree) as mild.      DURATION:   Reports the general length of symptoms to be years.                  ACTIVE PROBLEMS: Dry eye syndrome   ICD10: H04.129  ICD9: 375.15  Onset: 09/11/2014 11:00   Benign prostatic hypertrophy with urinary obstruction   ICD10: N40.1  ICD9: 600.01  Onset: 07/26/2014 10:31  Initial Date:    Polymyalgia rheumatica   ICD10:   ICD9: 725  Onset: 07/26/2014 10:31  Initial Date:    Asthma, intrinsic, stable   ICD10: J45.909  ICD9: 493.10  Onset: 07/26/2014 09:37  Initial Date: 09/11/2014  11:00    Diabetes, Type 2  ICD10: E11.9  ICD9: 250.00  Onset: 07/26/2014 08:44  Initial Date:   Benign hypertension   ICD10: I10  ICD9: 401.1  Onset: 07/26/2014 08:44  Initial Date:    Starter - Active Problems:  SURGERIES: 08/20/14 CE w IOL OS SLT OD 2010  MEDICATIONS: Furosemide (Lasix):   20 mg tablet  SIG-  1 each    in the AM     Simvastatin (Zocor):    40 mg tablet   SIG-   1 each     once a day       Claritin (Loratadine):    10 mg tablet    SIG-   1 each     once a day as needed for allergies              Singulair (Montelukast):   4 mg granule  SIG-  1 tab(s)   once a day (in the evening)    Symbicort  (Budesonide + Formoterol):   160 mcg-4.5 mcg/inh (aerosol with adapter)  SIG-  2 puff(s)   2 times a day    Albuterol (Proventil, Ventolin)  2 mg tablet  SIG-  1 each    every 6 hours    Ipratropium Bromide Nasal: Strength-  SIG-  Dose-  Freq-     Ceftin (Cefuroxime):   250 mg tablet  SIG-  1 tab(s)   2 times a day    Amlodipine (Norvasc):   10 mg tablet  SIG-  1 each   once a day    Losartan Potassium: Strength-  SIG-  Dose-  Freq-     Metformin (Glucophage):   500 mg tablet  SIG-  1 each    2 times a day     Metoprolol  (Lopressor):   50 mg tablet  SIG-  1 each   2 times a day  Allopurinol (Zyloprim):   100 mg tablet  SIG-  1 each    once a day     Aspirin:  81 mg tablet  SIG-  1 each   once a day    Prednisone (Deltasone/Orasone): 10 mg tablet  SIG-  dose pak tab(s)   Freq-     Glipizide (Glucotrol):   5 mg tablet  SIG-  1 each    2 times a day     NovoLog (Insulin) FlexPen:  100 units/mL solution  SIG-   as directed   3 times a day   Finasteride, 5 mg Performed Date/Time: 07/26/2014 08:49 [id number] Ordering Provider: Marylynn Pearson, Jr. Related Dxs-  Order Due:  Consultant:  Modifiers-    Flomax (Tamsulosin):   0.4 mg capsule  SIG-  1 each   once a day    Gabapentin: 300 mg capsule SIG-  Dose-  Freq- ROS:   GEN- Constitutional: HENT: GEN - Endocrine: Reports symptoms of diabetes.    LUNGS/Respiratory:  HEART/Cardiovascular: Reports symptoms of hypertension.    ABD/Gastrointestinal:  GU  Musculoskeletal (BJE): NEURO/Neurological: PSYCH/Psychiatric:    Is the pt oriented to time, place, person? YES   Mood normal     TOBACCO: Never smoker   ICD10: Z87.898 ICD9: V13.89 Onset: 07/26/2014 08:56 Initial Date:   Tobacco use:     Tobacco cessation:  SOCIAL HISTORY: Automotive engineer List - Social History  FAMILY HISTORY   Family History - 1st Degree Relatives:  Son alive and well.  ALLERGIES:Drug Allergies.  No Known.    ADMITTING DIAGNOSIS: Combined forms of age-related cataract, right eye   ICD10: H25.811  ICD9:   Onset: 07/26/2014 10:25  Initial Date:   POOR PUPIL DILATION ON FLOMAX  Puckering of macula, left eye   ICD10: H35.372   ICD9:   Onset: 10/02/2014 10:11  Initial Date:    Primary open-angle glaucoma, moderate stage   ICD10: H40.11X2  ICD9:   Onset: 09/11/2014 12:51  Initial Date:    Pseudophakia   ICD10: Z96.1 ICD9: 446.0 Onset: 08/28/2014 09:59   Dry eye syndrome of bilateral lacrimal glands   ICD10: H04.123  ICD9:   Onset: 07/26/2014 10:43  Initial Date:    Polymyalgia rheumatica   ICD10:   ICD9: 725  Onset: 07/26/2014 10:30  Initial Date:      Diabetes, Type 2  ICD10: E11.9  ICD9: 250.00  Onset: 07/26/2014 10:40  Initial Date:  SURGICAL TREATMENT PLAN: phaco emulsion cataract extraction   intraocular lens implant  OD  Risk and benefits of surgery have been reviewed with the patient and the patient agrees to proceed with the surgical procedure.      refered  to a retina specialist for macula pucker OS      REFRACTION today   start alphagan p  OU TID   GREY TOP USE 2 TIMES A DAY IN THE LEFT EYE discontinue when out  PINK TOP 2 TIMES A DAY IN THE LEFT EYE discontinue when out   STOP GTTS AS THEY RUN OUT    Actions:   Handouts: New Handout, What is a cataract?.    ___________________________ Marylynn Pearson, Brooke Bonito Starter - Inactive Problems:

## 2014-11-28 NOTE — Anesthesia Postprocedure Evaluation (Signed)
Anesthesia Post Note  Patient: Jacob [name]  Procedure(s) Performed: Procedure(s) (LRB): CATARACT EXTRACTION PHACO AND INTRAOCULAR LENS PLACEMENT (IOC) RIGHT  (Right)  Anesthesia type: MAC  Patient location: PACU  Post pain: Pain level controlled  Post assessment: Patient's Cardiovascular Status Stable  Last Vitals:  Filed Vitals:   11/28/14 1230  BP: 164/83  Pulse: 75  Temp:   Resp:     Post vital signs: Reviewed and stable  Level of consciousness: sedated  Complications: No apparent anesthesia complications

## 2014-11-29 ENCOUNTER — Encounter (HOSPITAL_COMMUNITY): Payer: Self-pay | Admitting: Ophthalmology

## 2015-01-09 ENCOUNTER — Other Ambulatory Visit: Payer: Self-pay | Admitting: Family Medicine

## 2015-02-14 ENCOUNTER — Other Ambulatory Visit: Payer: Self-pay | Admitting: Family Medicine

## 2015-03-04 ENCOUNTER — Other Ambulatory Visit: Payer: Self-pay | Admitting: Family Medicine

## 2015-03-04 NOTE — Telephone Encounter (Signed)
Patient is no longer coming to [facility]

## 2015-03-06 ENCOUNTER — Other Ambulatory Visit: Payer: Self-pay | Admitting: Urology

## 2015-03-06 DIAGNOSIS — N2889 Other specified disorders of kidney and ureter: Secondary | ICD-10-CM

## 2015-03-27 ENCOUNTER — Ambulatory Visit
Admission: RE | Admit: 2015-03-27 | Discharge: 2015-03-27 | Disposition: A | Discharge status: Home / Self Care | Payer: PPO | Source: Ambulatory Visit | Attending: Urology | Admitting: Urology

## 2015-03-27 DIAGNOSIS — N2889 Other specified disorders of kidney and ureter: Secondary | ICD-10-CM | POA: Diagnosis not present

## 2015-03-27 NOTE — Consult Note (Signed)
Chief Complaint: Patient was seen in consultation today for  Chief Complaint  Patient presents with  . Advice Only    Consult for Right Renal Mass     at the request of Herrick,Benjamin W  Referring Physician(s): Herrick,Benjamin W  History of Present Illness: Jacob [name] is a 75 y.o. male with multiple comorbidities including advanced COPD, coronary bypass, and morbid obesity. He underwent a CT of the abdomen and pelvis in May 2016 for other symptoms. This demonstrated a 3 x 3.6 cm solid enhancing right lower pole renal mass compatible with a renal carcinoma. No associated hydronephrosis or obstruction. He denies flank pain, dysuria or hematuria. He currently is asymptomatic. He presents to discuss treatment options including percutaneous image guided cryoablation for renal cell carcinoma.  Past Medical History  Diagnosis Date  . Allergy   . Asthma   . Diabetes mellitus without complication   . Cataract   . Arthritis   . Polymyalgia rheumatica     maintained on Prednisone, Plaquenil. Followed by rhuematology every 4 months/[name].  Marland Kitchen COPD (chronic obstructive pulmonary disease)   . Anemia   . Hypertension   . BPH (benign prostatic hyperplasia)   . Sleep apnea     CPAP   Trying to use  . Shortness of breath dyspnea     with exertion  . Anxiety   . Chronic kidney disease     chronic  kidney failure  kidney function at 42%  . Coronary artery disease     CABG  7 bypasses  . Hepatitis     many years ago  . Cancer of kidney     Past Surgical History  Procedure Laterality Date  . Appendectomy    . Hernia repair    . Prostate surgery      TURP at [facility].  Marland Kitchen Coronary artery bypass graft  03/17/1995    Wynonia Lawman; followed every six months.  . Cataract extraction w/phaco Right 11/28/2014    Procedure: CATARACT EXTRACTION PHACO AND INTRAOCULAR LENS PLACEMENT (IOC) RIGHT ;  Surgeon: Marylynn Pearson, MD;  Location: [place];  Service: Ophthalmology;  Laterality: Right;     Allergies: Ace inhibitors; Ciprocinonide; Flunisolide; Metformin and related; Sertraline; Sulindac; and Terazosin  Medications: Prior to Admission medications   Medication Sig Start Date End Date Taking? Authorizing Provider  albuterol (PROVENTIL HFA;VENTOLIN HFA) 108 (90 BASE) MCG/ACT inhaler Inhale 2 puffs into the lungs every 6 (six) hours as needed for wheezing or shortness of breath. 07/16/13  Yes Wardell Honour, MD  allopurinol (ZYLOPRIM) 300 MG tablet Take 300 mg by mouth daily with lunch.   Yes Historical Provider, MD  amLODipine (NORVASC) 10 MG tablet Take 10 mg by mouth daily with supper.    Yes Historical Provider, MD  aspirin EC 81 MG tablet Take 81 mg by mouth daily with lunch.   Yes Historical Provider, MD  budesonide-formoterol (SYMBICORT) 160-4.5 MCG/ACT inhaler Inhale 2 puffs into the lungs 2 (two) times daily.   Yes Historical Provider, MD  calcitRIOL (ROCALTROL) 0.25 MCG capsule Take 0.25 mcg by mouth every Monday, Wednesday, and Friday.   Yes Historical Provider, MD  finasteride (PROSCAR) 5 MG tablet Take 5 mg by mouth at bedtime.    Yes Historical Provider, MD  furosemide (LASIX) 20 MG tablet Take 1 tablet (20 mg total) by mouth 2 (two) times daily. , as needed. NO MORE REFILLS WITHOUT OFFICE VISIT - 2ND NOTICE 02/18/15  Yes Wardell Honour,  MD  gabapentin (NEURONTIN) 300 MG capsule Take 1 capsule (300 mg total) by mouth 3 (three) times daily. PATIENT NEEDS OFFICE VISIT FOR ADDITIONAL REFILLS Patient taking differently: Take 300 mg by mouth at bedtime.  01/23/13  Yes Wendie Agreste, MD  insulin aspart protamine- aspart (NOVOLOG MIX 70/30) (70-30) 100 UNIT/ML injection Inject 60-70 Units into the skin 2 (two) times daily with a meal. Inject 70 units subcutaneously with breakfast and 60 units with supper   Yes Historical Provider, MD  ipratropium-albuterol (DUONEB) 0.5-2.5 (3) MG/3ML SOLN Take 3 mLs by nebulization every 6 (six) hours as needed (shortness of breath/wheezing).  Reported on 03/27/2015   Yes Historical Provider, MD  Liraglutide (VICTOZA) 18 MG/3ML SOPN Inject 1.2 mg into the skin daily at 3 pm.    Yes Historical Provider, MD  losartan (COZAAR) 100 MG tablet Take 100 mg by mouth at bedtime.    Yes Historical Provider, MD  metoprolol (LOPRESSOR) 50 MG tablet Take 25 mg by mouth 2 (two) times daily.   Yes Historical Provider, MD  montelukast (SINGULAIR) 10 MG tablet Take 1 tablet (10 mg total) by mouth at bedtime. 07/27/13  Yes Wardell Honour, MD  Multiple Vitamins-Minerals (PRESERVISION AREDS 2 PO) Take by mouth.   Yes Historical Provider, MD  potassium chloride SA (K-DUR,KLOR-CON) 20 MEQ tablet Take 20 mEq by mouth daily.   Yes Historical Provider, MD  risedronate (ACTONEL) 35 MG tablet Take 35 mg by mouth every 7 (seven) days. with water on empty stomach, nothing by mouth or lie down for next 30 minutes.   Yes Historical Provider, MD  simvastatin (ZOCOR) 80 MG tablet Take 40 mg by mouth daily with supper.   Yes Historical Provider, MD  sodium chloride 0.9 % nebulizer solution Take 3 mLs by nebulization every 6 (six) hours as needed for wheezing.   Yes Historical Provider, MD  Tamsulosin HCl (FLOMAX) 0.4 MG CAPS Take 0.4 mg by mouth at bedtime.    Yes Historical Provider, MD  cetirizine (ZYRTEC) 10 MG tablet Take 1 tablet (10 mg total) by mouth daily. Patient not taking: Reported on 11/26/2014 07/27/13   Wardell Honour, MD  Fluticasone-Salmeterol (ADVAIR) 100-50 MCG/DOSE AEPB Inhale 1 puff into the lungs 2 (two) times daily. Patient not taking: Reported on 11/26/2014 07/27/13   Wardell Honour, MD  glipiZIDE (GLUCOTROL) 10 MG tablet Take 10 mg by mouth 2 (two) times daily before a meal. Reported on 03/27/2015    Historical Provider, MD  guaiFENesin-codeine (ROBITUSSIN AC) 100-10 MG/5ML syrup Take 5 mLs by mouth 4 (four) times daily as needed for cough. Patient not taking: Reported on 05/28/2014 03/22/14   Wardell Honour, MD  insulin regular (NOVOLIN R,HUMULIN R) 100  units/mL injection Inject 10-15 Units into the skin 3 (three) times daily as needed for high blood sugar (CBG >300). Reported on 03/27/2015    Historical Provider, MD  ipratropium (ATROVENT) 0.02 % nebulizer solution USE 1 AMPULE IN NEBULIZER 4 TIMES A DAY Patient not taking: Reported on 11/26/2014 09/15/13   Wardell Honour, MD  ipratropium (ATROVENT) 0.03 % nasal spray Place 2 sprays into the nose 2 (two) times daily. Patient taking differently: Place 2 sprays into the nose 3 (three) times daily.  01/23/13   Wardell Honour, MD  lovastatin (MEVACOR) 20 MG tablet Take 1 tablet (20 mg total) by mouth at bedtime. Patient not taking: Reported on 05/28/2014 02/04/14   Wardell Honour, MD  oxyCODONE-acetaminophen (PERCOCET/ROXICET) 5-325 MG per tablet  Take 1 tablet by mouth 2 (two) times daily as needed for severe pain. Reported on 03/27/2015    Historical Provider, MD  predniSONE (DELTASONE) 1 MG tablet Take 4 mg by mouth See admin instructions. Take  #4 1 mg tablets (4 mg)with #2 5 mg tablets (10 mg) by mouth daily with breakfast for a 14 mg dose until 12/01/14, taper down by 1 mg every 7 days until discontinued by MD    Historical Provider, MD  predniSONE (DELTASONE) 20 MG tablet Three tablets daily x 2 days, then two tablets daily x 5 days then one tablet daily x 3 days Patient not taking: Reported on 05/28/2014 03/22/14   Wardell Honour, MD  predniSONE (DELTASONE) 5 MG tablet Take 1-2 tablets (5-10 mg total) by mouth daily. Patient taking differently: Take 10 mg by mouth See admin instructions. Take #2 5 mg tablets (10 mg) with  #4 1 mg tablets (4 mg) by mouth daily with breakfast for a 14 mg dose until 12/01/14, taper down by 1 mg every 7 days until discontinued by MD 08/17/12   Wardell Honour, MD  vitamin C (ASCORBIC ACID) 500 MG tablet Take 500 mg by mouth daily with lunch. Reported on 03/27/2015    Historical Provider, MD     No family history on file.  Social History   Social History  . Marital Status:  Married    Spouse Name: N/A  . Number of Children: N/A  . Years of Education: N/A   Occupational History  . Driver    Social History Main Topics  . Smoking status: Former Smoker    Quit date: 03/16/1957  . Smokeless tobacco: Never Used  . Alcohol Use: No  . Drug Use: No  . Sexual Activity: Yes    Birth Control/ Protection: None   Other Topics Concern  . Not on file   Social History Narrative   Marital status: Married x 55 years.      Children: 3 children; 5 grandchildren; 6 gg.      Lives: with wife.        Employment: retired; [employer].  Working every other week; 45 hours.      Tobacco:  Quit at age 64.      Alcohol:  None      Exercise: none      ADLs: indepedent with ADLs;  Uses cane and has a walker.  Drives.      Advanced Directives:  +Living Will.  DNR/DNI.  Wife is HCPOA.     Review of Systems: A 12 point ROS discussed and pertinent positives are indicated in the HPI above.  All other systems are negative.  Review of Systems  Constitutional: Negative for fever, diaphoresis, activity change, fatigue and unexpected weight change.  Respiratory: Negative for chest tightness.   Cardiovascular: Negative for chest pain.  Gastrointestinal: Negative for abdominal pain and abdominal distention.  Genitourinary: Negative for dysuria, frequency, hematuria and flank pain.    Vital Signs: BP 148/85 mmHg  Pulse 73  Temp(Src) 97.9 F (36.6 C) (Oral)  Resp 14  Ht 5\' 11"  (1.803 m)  Wt 241 lb (109.317 kg)  BMI 33.63 kg/m2  SpO2 98%  Physical Exam  Constitutional: He appears well-developed and well-nourished. No distress.  Elderly male in no distress. He walks with a cane. Moderately obese.  Cardiovascular: Normal rate and regular rhythm.   No murmur heard. Pulmonary/Chest: Effort normal. No respiratory distress. He has wheezes. He has no rales.  He exhibits no tenderness.  Abdominal: Soft. Bowel sounds are normal. He exhibits no distension. There  is no tenderness.  Obese abdominal body habitus.  Skin: Skin is warm and dry. No rash noted. He is not diaphoretic. No erythema.  Psychiatric: He has a normal mood and affect. His behavior is normal. Judgment and thought content normal.    Mallampati Score:   3  Imaging: No results found.  Labs:  CBC:  Recent Labs  03/29/14 1737 11/26/14 1425  WBC 10.7* 11.4*  HGB 14.2 15.0  HCT 45.0 45.8  PLT  --  176    COAGS: No results for input(s): INR, APTT in the last 8760 hours.  BMP:  Recent Labs  03/29/14 1812 11/26/14 1425  NA 137 143  K 5.2 4.8  CL 99 108  CO2 29 26  GLUCOSE 314* 93  BUN 32* 19  CALCIUM 9.2 9.7  CREATININE 1.41* 1.27*  GFRNONAA  --  54*  GFRAA  --  >60    LIVER FUNCTION TESTS:  Recent Labs  03/29/14 1812 11/26/14 1425  BILITOT 0.7 0.7  AST 13 16  ALT 25 18  ALKPHOS 78 84  PROT 6.1 7.1  ALBUMIN 3.4* 3.6    TUMOR MARKERS: No results for input(s): AFPTM, CEA, CA199, CHROMGRNA in the last 8760 hours.  Assessment and Plan:  75 year old male with significant comorbidities including COPD and coronary artery disease. Because of this, he has poor exercise intolerance. He was found to have a solid enhancing right renal mass compatible with a right renal cell carcinoma. This was diagnosed in May 2016 on a CT related to other symptoms. Lesion measures 3 x 3.6 cm. It is slightly exophytic in the right kidney lower pole and posterior lateral in position. The lesion is amenable to percutaneous CT-guided cryoablation. The procedure, risks, benefits and alternatives were reviewed. This does require anesthesia. After our discussion I think they have a clear understanding of the procedure and risks. He would like to proceed with the treatment. This will be scheduled in the next few weeks at [facility].  Plan: 3.6 cm right renal cell carcinoma. Schedule for CT-guided cryoablation under general anesthesia  hospital in the next few  weeks.  Thank you for this interesting consult.  I greatly enjoyed meeting HEARLD MICHALK and look forward to participating in their care.  A copy of this report was sent to the requesting provider on this date.  SignedGreggory Keen 03/27/2015, 10:39 AM   I spent a total of  30 Minutes   in face to face in clinical consultation, greater than 50% of which was counseling/coordinating care for this patient with a right renal carcinoma.

## 2015-04-05 ENCOUNTER — Other Ambulatory Visit: Payer: Self-pay | Admitting: Radiology

## 2015-05-06 ENCOUNTER — Encounter (HOSPITAL_COMMUNITY): Payer: PPO

## 2015-05-10 ENCOUNTER — Encounter (HOSPITAL_COMMUNITY): Admission: RE | Payer: Self-pay | Source: Ambulatory Visit

## 2015-05-10 ENCOUNTER — Ambulatory Visit (HOSPITAL_COMMUNITY): Admission: RE | Admit: 2015-05-10 | Payer: PPO | Source: Ambulatory Visit | Admitting: Interventional Radiology

## 2015-05-10 ENCOUNTER — Other Ambulatory Visit (HOSPITAL_COMMUNITY): Payer: PPO

## 2015-05-10 SURGERY — RADIO FREQUENCY ABLATION
Anesthesia: General | Laterality: Right

## 2015-07-12 DIAGNOSIS — N2889 Other specified disorders of kidney and ureter: Secondary | ICD-10-CM | POA: Insufficient documentation

## 2015-07-12 DIAGNOSIS — I10 Essential (primary) hypertension: Secondary | ICD-10-CM | POA: Insufficient documentation

## 2015-07-12 HISTORY — DX: Essential (primary) hypertension: I10

## 2015-07-12 HISTORY — DX: Other specified disorders of kidney and ureter: N28.89

## 2015-07-23 ENCOUNTER — Encounter (HOSPITAL_COMMUNITY): Payer: Self-pay

## 2015-07-23 ENCOUNTER — Emergency Department (HOSPITAL_COMMUNITY)
Admission: EM | Admit: 2015-07-23 | Discharge: 2015-07-24 | Disposition: A | Discharge status: Home / Self Care | Payer: PPO | Attending: Emergency Medicine | Admitting: Emergency Medicine

## 2015-07-23 DIAGNOSIS — Y998 Other external cause status: Secondary | ICD-10-CM | POA: Insufficient documentation

## 2015-07-23 DIAGNOSIS — Y9389 Activity, other specified: Secondary | ICD-10-CM | POA: Insufficient documentation

## 2015-07-23 DIAGNOSIS — M5126 Other intervertebral disc displacement, lumbar region: Secondary | ICD-10-CM | POA: Diagnosis not present

## 2015-07-23 DIAGNOSIS — E119 Type 2 diabetes mellitus without complications: Secondary | ICD-10-CM | POA: Insufficient documentation

## 2015-07-23 DIAGNOSIS — I2510 Atherosclerotic heart disease of native coronary artery without angina pectoris: Secondary | ICD-10-CM | POA: Diagnosis not present

## 2015-07-23 DIAGNOSIS — Y9289 Other specified places as the place of occurrence of the external cause: Secondary | ICD-10-CM | POA: Diagnosis not present

## 2015-07-23 DIAGNOSIS — R110 Nausea: Secondary | ICD-10-CM | POA: Diagnosis not present

## 2015-07-23 DIAGNOSIS — J449 Chronic obstructive pulmonary disease, unspecified: Secondary | ICD-10-CM | POA: Diagnosis not present

## 2015-07-23 DIAGNOSIS — R251 Tremor, unspecified: Secondary | ICD-10-CM | POA: Diagnosis not present

## 2015-07-23 DIAGNOSIS — N189 Chronic kidney disease, unspecified: Secondary | ICD-10-CM | POA: Insufficient documentation

## 2015-07-23 DIAGNOSIS — S3992XA Unspecified injury of lower back, initial encounter: Secondary | ICD-10-CM | POA: Insufficient documentation

## 2015-07-23 DIAGNOSIS — W1839XA Other fall on same level, initial encounter: Secondary | ICD-10-CM | POA: Insufficient documentation

## 2015-07-23 DIAGNOSIS — S8992XA Unspecified injury of left lower leg, initial encounter: Secondary | ICD-10-CM | POA: Diagnosis not present

## 2015-07-23 DIAGNOSIS — I129 Hypertensive chronic kidney disease with stage 1 through stage 4 chronic kidney disease, or unspecified chronic kidney disease: Secondary | ICD-10-CM | POA: Insufficient documentation

## 2015-07-23 DIAGNOSIS — M47816 Spondylosis without myelopathy or radiculopathy, lumbar region: Secondary | ICD-10-CM | POA: Diagnosis not present

## 2015-07-23 DIAGNOSIS — C649 Malignant neoplasm of unspecified kidney, except renal pelvis: Secondary | ICD-10-CM

## 2015-07-23 HISTORY — DX: Malignant neoplasm of unspecified kidney, except renal pelvis: C64.9

## 2015-07-23 MED ORDER — ONDANSETRON 4 MG PO TBDP
ORAL_TABLET | ORAL | Status: AC
  Filled 2015-07-23: qty 1

## 2015-07-23 MED ORDER — ONDANSETRON 4 MG PO TBDP
4.0000 mg | ORAL_TABLET | Freq: Once | ORAL | Status: AC | PRN
  Administered 2015-07-23: 4 mg via ORAL

## 2015-07-23 NOTE — ED Notes (Signed)
Pt reports he fell last Saturday and is having pain to lower back and left leg. He states the pain is so bad it feels like he has lost feeling. Pt able to feel this RN touch his left leg and left foot and he is able to wiggle his toes.  Pt noted to have tremors. He reports nausea as well.

## 2015-07-23 NOTE — ED Notes (Signed)
Pt was called three times, without a response. ER waiting room, ER peds and ER sub-waiting were checked. Pt will be called again.

## 2015-07-24 ENCOUNTER — Inpatient Hospital Stay (HOSPITAL_COMMUNITY)
Admission: AD | Admit: 2015-07-24 | Discharge: 2015-08-09 | DRG: 518 | Disposition: A | Discharge status: Skilled Nursing Facility | Payer: PPO | Source: Other Acute Inpatient Hospital | Attending: Neurosurgery | Admitting: Neurosurgery

## 2015-07-24 DIAGNOSIS — J449 Chronic obstructive pulmonary disease, unspecified: Secondary | ICD-10-CM | POA: Diagnosis present

## 2015-07-24 DIAGNOSIS — R058 Other specified cough: Secondary | ICD-10-CM

## 2015-07-24 DIAGNOSIS — J690 Pneumonitis due to inhalation of food and vomit: Secondary | ICD-10-CM | POA: Diagnosis not present

## 2015-07-24 DIAGNOSIS — Z85528 Personal history of other malignant neoplasm of kidney: Secondary | ICD-10-CM

## 2015-07-24 DIAGNOSIS — I2510 Atherosclerotic heart disease of native coronary artery without angina pectoris: Secondary | ICD-10-CM | POA: Diagnosis present

## 2015-07-24 DIAGNOSIS — N401 Enlarged prostate with lower urinary tract symptoms: Secondary | ICD-10-CM | POA: Diagnosis present

## 2015-07-24 DIAGNOSIS — E669 Obesity, unspecified: Secondary | ICD-10-CM | POA: Diagnosis present

## 2015-07-24 DIAGNOSIS — K5900 Constipation, unspecified: Secondary | ICD-10-CM | POA: Diagnosis not present

## 2015-07-24 DIAGNOSIS — E11649 Type 2 diabetes mellitus with hypoglycemia without coma: Secondary | ICD-10-CM | POA: Diagnosis not present

## 2015-07-24 DIAGNOSIS — E860 Dehydration: Secondary | ICD-10-CM | POA: Diagnosis present

## 2015-07-24 DIAGNOSIS — I255 Ischemic cardiomyopathy: Secondary | ICD-10-CM | POA: Diagnosis not present

## 2015-07-24 DIAGNOSIS — E1122 Type 2 diabetes mellitus with diabetic chronic kidney disease: Secondary | ICD-10-CM | POA: Diagnosis present

## 2015-07-24 DIAGNOSIS — Z4789 Encounter for other orthopedic aftercare: Secondary | ICD-10-CM | POA: Diagnosis not present

## 2015-07-24 DIAGNOSIS — N179 Acute kidney failure, unspecified: Secondary | ICD-10-CM | POA: Diagnosis present

## 2015-07-24 DIAGNOSIS — Z7952 Long term (current) use of systemic steroids: Secondary | ICD-10-CM

## 2015-07-24 DIAGNOSIS — R0602 Shortness of breath: Secondary | ICD-10-CM

## 2015-07-24 DIAGNOSIS — G4733 Obstructive sleep apnea (adult) (pediatric): Secondary | ICD-10-CM | POA: Diagnosis present

## 2015-07-24 DIAGNOSIS — Y95 Nosocomial condition: Secondary | ICD-10-CM | POA: Diagnosis not present

## 2015-07-24 DIAGNOSIS — N184 Chronic kidney disease, stage 4 (severe): Secondary | ICD-10-CM | POA: Diagnosis present

## 2015-07-24 DIAGNOSIS — M545 Low back pain: Secondary | ICD-10-CM | POA: Diagnosis present

## 2015-07-24 DIAGNOSIS — M5416 Radiculopathy, lumbar region: Secondary | ICD-10-CM

## 2015-07-24 DIAGNOSIS — R338 Other retention of urine: Secondary | ICD-10-CM | POA: Diagnosis present

## 2015-07-24 DIAGNOSIS — I25810 Atherosclerosis of coronary artery bypass graft(s) without angina pectoris: Secondary | ICD-10-CM | POA: Diagnosis not present

## 2015-07-24 DIAGNOSIS — R1312 Dysphagia, oropharyngeal phase: Secondary | ICD-10-CM | POA: Diagnosis not present

## 2015-07-24 DIAGNOSIS — M353 Polymyalgia rheumatica: Secondary | ICD-10-CM | POA: Diagnosis present

## 2015-07-24 DIAGNOSIS — D649 Anemia, unspecified: Secondary | ICD-10-CM | POA: Diagnosis not present

## 2015-07-24 DIAGNOSIS — D860 Sarcoidosis of lung: Secondary | ICD-10-CM | POA: Diagnosis present

## 2015-07-24 DIAGNOSIS — I517 Cardiomegaly: Secondary | ICD-10-CM | POA: Diagnosis not present

## 2015-07-24 DIAGNOSIS — Z888 Allergy status to other drugs, medicaments and biological substances status: Secondary | ICD-10-CM

## 2015-07-24 DIAGNOSIS — I248 Other forms of acute ischemic heart disease: Secondary | ICD-10-CM | POA: Diagnosis present

## 2015-07-24 DIAGNOSIS — R05 Cough: Secondary | ICD-10-CM

## 2015-07-24 DIAGNOSIS — W1839XA Other fall on same level, initial encounter: Secondary | ICD-10-CM | POA: Diagnosis present

## 2015-07-24 DIAGNOSIS — Z9889 Other specified postprocedural states: Secondary | ICD-10-CM | POA: Diagnosis not present

## 2015-07-24 DIAGNOSIS — M5126 Other intervertebral disc displacement, lumbar region: Secondary | ICD-10-CM | POA: Diagnosis present

## 2015-07-24 DIAGNOSIS — Z01810 Encounter for preprocedural cardiovascular examination: Secondary | ICD-10-CM | POA: Diagnosis not present

## 2015-07-24 DIAGNOSIS — F419 Anxiety disorder, unspecified: Secondary | ICD-10-CM | POA: Diagnosis present

## 2015-07-24 DIAGNOSIS — M109 Gout, unspecified: Secondary | ICD-10-CM | POA: Diagnosis present

## 2015-07-24 DIAGNOSIS — Z8709 Personal history of other diseases of the respiratory system: Secondary | ICD-10-CM | POA: Diagnosis not present

## 2015-07-24 DIAGNOSIS — J189 Pneumonia, unspecified organism: Secondary | ICD-10-CM | POA: Diagnosis not present

## 2015-07-24 DIAGNOSIS — R4182 Altered mental status, unspecified: Secondary | ICD-10-CM

## 2015-07-24 DIAGNOSIS — I129 Hypertensive chronic kidney disease with stage 1 through stage 4 chronic kidney disease, or unspecified chronic kidney disease: Secondary | ICD-10-CM | POA: Diagnosis present

## 2015-07-24 DIAGNOSIS — M4806 Spinal stenosis, lumbar region: Secondary | ICD-10-CM | POA: Diagnosis present

## 2015-07-24 DIAGNOSIS — R0902 Hypoxemia: Secondary | ICD-10-CM | POA: Diagnosis not present

## 2015-07-24 DIAGNOSIS — Z87891 Personal history of nicotine dependence: Secondary | ICD-10-CM

## 2015-07-24 DIAGNOSIS — R000 Tachycardia, unspecified: Secondary | ICD-10-CM | POA: Diagnosis not present

## 2015-07-24 DIAGNOSIS — E785 Hyperlipidemia, unspecified: Secondary | ICD-10-CM | POA: Diagnosis present

## 2015-07-24 DIAGNOSIS — R296 Repeated falls: Secondary | ICD-10-CM | POA: Diagnosis present

## 2015-07-24 DIAGNOSIS — I5030 Unspecified diastolic (congestive) heart failure: Secondary | ICD-10-CM

## 2015-07-24 DIAGNOSIS — M961 Postlaminectomy syndrome, not elsewhere classified: Secondary | ICD-10-CM | POA: Diagnosis not present

## 2015-07-24 DIAGNOSIS — J188 Other pneumonia, unspecified organism: Secondary | ICD-10-CM | POA: Diagnosis not present

## 2015-07-24 DIAGNOSIS — Z6832 Body mass index (BMI) 32.0-32.9, adult: Secondary | ICD-10-CM | POA: Diagnosis not present

## 2015-07-24 DIAGNOSIS — Z862 Personal history of diseases of the blood and blood-forming organs and certain disorders involving the immune mechanism: Secondary | ICD-10-CM

## 2015-07-24 DIAGNOSIS — Z452 Encounter for adjustment and management of vascular access device: Secondary | ICD-10-CM

## 2015-07-24 DIAGNOSIS — N289 Disorder of kidney and ureter, unspecified: Secondary | ICD-10-CM | POA: Diagnosis not present

## 2015-07-24 DIAGNOSIS — M48061 Spinal stenosis, lumbar region without neurogenic claudication: Secondary | ICD-10-CM | POA: Diagnosis present

## 2015-07-24 DIAGNOSIS — Z419 Encounter for procedure for purposes other than remedying health state, unspecified: Secondary | ICD-10-CM

## 2015-07-24 DIAGNOSIS — Z01811 Encounter for preprocedural respiratory examination: Secondary | ICD-10-CM

## 2015-07-24 DIAGNOSIS — Z7951 Long term (current) use of inhaled steroids: Secondary | ICD-10-CM

## 2015-07-24 DIAGNOSIS — R2681 Unsteadiness on feet: Secondary | ICD-10-CM | POA: Diagnosis not present

## 2015-07-24 DIAGNOSIS — Z79899 Other long term (current) drug therapy: Secondary | ICD-10-CM

## 2015-07-24 DIAGNOSIS — Z794 Long term (current) use of insulin: Secondary | ICD-10-CM

## 2015-07-24 DIAGNOSIS — M47816 Spondylosis without myelopathy or radiculopathy, lumbar region: Secondary | ICD-10-CM | POA: Diagnosis not present

## 2015-07-24 DIAGNOSIS — R262 Difficulty in walking, not elsewhere classified: Secondary | ICD-10-CM | POA: Diagnosis not present

## 2015-07-24 DIAGNOSIS — M6281 Muscle weakness (generalized): Secondary | ICD-10-CM | POA: Diagnosis not present

## 2015-07-24 DIAGNOSIS — Z7982 Long term (current) use of aspirin: Secondary | ICD-10-CM

## 2015-07-24 DIAGNOSIS — R748 Abnormal levels of other serum enzymes: Secondary | ICD-10-CM | POA: Diagnosis not present

## 2015-07-24 DIAGNOSIS — Z951 Presence of aortocoronary bypass graft: Secondary | ICD-10-CM

## 2015-07-24 HISTORY — DX: Other intervertebral disc displacement, lumbar region: M51.26

## 2015-07-24 MED ORDER — CYCLOSPORINE 0.05 % OP EMUL
1.0000 [drp] | Freq: Two times a day (BID) | OPHTHALMIC | Status: DC
  Administered 2015-07-25 – 2015-08-09 (×31): 1 [drp] via OPHTHALMIC
  Filled 2015-07-24 (×32): qty 1

## 2015-07-24 MED ORDER — PILOCARPINE HCL 2 % OP SOLN
1.0000 [drp] | Freq: Three times a day (TID) | OPHTHALMIC | Status: DC
  Administered 2015-07-25 – 2015-08-09 (×39): 1 [drp] via OPHTHALMIC
  Filled 2015-07-24 (×2): qty 15

## 2015-07-24 MED ORDER — ASPIRIN EC 81 MG PO TBEC
81.0000 mg | DELAYED_RELEASE_TABLET | Freq: Every day | ORAL | Status: DC
  Administered 2015-07-25 – 2015-08-09 (×16): 81 mg via ORAL
  Filled 2015-07-24 (×16): qty 1

## 2015-07-24 MED ORDER — RISEDRONATE SODIUM 150 MG PO TABS: 150.0000 mg | ORAL_TABLET | ORAL | Status: DC

## 2015-07-24 MED ORDER — POTASSIUM CHLORIDE IN NACL 20-0.9 MEQ/L-% IV SOLN
INTRAVENOUS | Status: DC
  Administered 2015-07-25 – 2015-07-31 (×5): via INTRAVENOUS
  Filled 2015-07-24 (×17): qty 1000

## 2015-07-24 MED ORDER — ONDANSETRON HCL 4 MG/2ML IJ SOLN
4.0000 mg | Freq: Four times a day (QID) | INTRAMUSCULAR | Status: DC | PRN
  Administered 2015-08-02 – 2015-08-03 (×2): 4 mg via INTRAVENOUS
  Filled 2015-07-24 (×2): qty 2

## 2015-07-24 MED ORDER — ACETAMINOPHEN 325 MG PO TABS
650.0000 mg | ORAL_TABLET | Freq: Four times a day (QID) | ORAL | Status: DC | PRN
  Administered 2015-07-31 – 2015-08-03 (×2): 650 mg via ORAL
  Filled 2015-07-24 (×2): qty 2

## 2015-07-24 MED ORDER — LOSARTAN POTASSIUM 50 MG PO TABS
100.0000 mg | ORAL_TABLET | Freq: Every day | ORAL | Status: DC
  Administered 2015-07-25 – 2015-08-03 (×10): 100 mg via ORAL
  Filled 2015-07-24 (×11): qty 2

## 2015-07-24 MED ORDER — PREDNISONE 5 MG PO TABS: 5.0000 mg | ORAL_TABLET | Freq: Every day | ORAL | Status: DC

## 2015-07-24 MED ORDER — PROSIGHT PO TABS
2.0000 | ORAL_TABLET | Freq: Two times a day (BID) | ORAL | Status: DC
  Administered 2015-07-25 – 2015-08-09 (×29): 2 via ORAL
  Filled 2015-07-24 (×31): qty 2

## 2015-07-24 MED ORDER — ONDANSETRON HCL 4 MG PO TABS: 4.0000 mg | ORAL_TABLET | Freq: Four times a day (QID) | ORAL | Status: DC | PRN

## 2015-07-24 MED ORDER — ALBUTEROL SULFATE HFA 108 (90 BASE) MCG/ACT IN AERS: 2.0000 | INHALATION_SPRAY | Freq: Four times a day (QID) | RESPIRATORY_TRACT | Status: DC | PRN

## 2015-07-24 MED ORDER — PREDNISONE 1 MG PO TABS
2.0000 mg | ORAL_TABLET | Freq: Every day | ORAL | Status: DC
  Filled 2015-07-24: qty 2

## 2015-07-24 MED ORDER — ASPIRIN EC 81 MG PO TBEC: 81.0000 mg | DELAYED_RELEASE_TABLET | Freq: Every day | ORAL | Status: DC

## 2015-07-24 MED ORDER — WHITE PETROLATUM GEL
Status: AC
  Administered 2015-07-24: 23:00:00
  Filled 2015-07-24: qty 1

## 2015-07-24 MED ORDER — AMLODIPINE BESYLATE 10 MG PO TABS
10.0000 mg | ORAL_TABLET | Freq: Every day | ORAL | Status: DC
  Administered 2015-07-25 – 2015-08-08 (×14): 10 mg via ORAL
  Filled 2015-07-24 (×14): qty 1

## 2015-07-24 MED ORDER — SODIUM CHLORIDE 0.9% FLUSH: 3.0000 mL | INTRAVENOUS | Status: DC | PRN

## 2015-07-24 MED ORDER — MAGNESIUM CITRATE PO SOLN
1.0000 | Freq: Once | ORAL | Status: AC | PRN
  Administered 2015-07-27: 1 via ORAL
  Filled 2015-07-24: qty 296

## 2015-07-24 MED ORDER — OXYCODONE HCL 5 MG PO TABS
5.0000 mg | ORAL_TABLET | ORAL | Status: DC | PRN
  Administered 2015-07-25 – 2015-08-09 (×39): 5 mg via ORAL
  Filled 2015-07-24 (×40): qty 1

## 2015-07-24 MED ORDER — FINASTERIDE 5 MG PO TABS
5.0000 mg | ORAL_TABLET | Freq: Every day | ORAL | Status: DC
  Administered 2015-07-25 – 2015-08-08 (×16): 5 mg via ORAL
  Filled 2015-07-24 (×16): qty 1

## 2015-07-24 MED ORDER — HYDROMORPHONE HCL 1 MG/ML IJ SOLN
0.5000 mg | INTRAMUSCULAR | Status: DC | PRN
  Administered 2015-07-25 – 2015-08-02 (×11): 0.5 mg via INTRAVENOUS
  Filled 2015-07-24 (×12): qty 1

## 2015-07-24 MED ORDER — IPRATROPIUM-ALBUTEROL 0.5-2.5 (3) MG/3ML IN SOLN
3.0000 mL | Freq: Four times a day (QID) | RESPIRATORY_TRACT | Status: DC | PRN
  Administered 2015-07-25: 3 mL via RESPIRATORY_TRACT
  Filled 2015-07-24: qty 3

## 2015-07-24 MED ORDER — TIZANIDINE HCL 4 MG PO TABS
4.0000 mg | ORAL_TABLET | Freq: Three times a day (TID) | ORAL | Status: DC | PRN
  Administered 2015-07-25 – 2015-08-07 (×13): 4 mg via ORAL
  Filled 2015-07-24 (×15): qty 1

## 2015-07-24 MED ORDER — GABAPENTIN 300 MG PO CAPS
300.0000 mg | ORAL_CAPSULE | Freq: Three times a day (TID) | ORAL | Status: DC
  Administered 2015-07-25 – 2015-08-09 (×46): 300 mg via ORAL
  Filled 2015-07-24 (×46): qty 1

## 2015-07-24 MED ORDER — LIRAGLUTIDE 18 MG/3ML ~~LOC~~ SOPN
1.2000 mg | PEN_INJECTOR | Freq: Every day | SUBCUTANEOUS | Status: DC
  Administered 2015-07-28 – 2015-08-09 (×9): 1.2 mg via SUBCUTANEOUS

## 2015-07-24 MED ORDER — METOPROLOL TARTRATE 25 MG PO TABS
25.0000 mg | ORAL_TABLET | Freq: Two times a day (BID) | ORAL | Status: DC
  Administered 2015-07-25 – 2015-08-03 (×19): 25 mg via ORAL
  Filled 2015-07-24 (×19): qty 1

## 2015-07-24 MED ORDER — SODIUM CHLORIDE 0.9 % IN NEBU
3.0000 mL | INHALATION_SOLUTION | Freq: Four times a day (QID) | RESPIRATORY_TRACT | Status: DC | PRN
Start: 2015-07-24 — End: 2015-07-24

## 2015-07-24 MED ORDER — ALLOPURINOL 100 MG PO TABS
300.0000 mg | ORAL_TABLET | Freq: Every day | ORAL | Status: DC
Start: 2015-07-25 — End: 2015-08-09
  Administered 2015-07-25 – 2015-08-09 (×14): 300 mg via ORAL
  Filled 2015-07-24 (×14): qty 3

## 2015-07-24 MED ORDER — ACETAMINOPHEN 650 MG RE SUPP: 650.0000 mg | Freq: Four times a day (QID) | RECTAL | Status: DC | PRN

## 2015-07-24 MED ORDER — PREDNISONE 5 MG PO TABS
7.0000 mg | ORAL_TABLET | Freq: Every day | ORAL | Status: DC
  Administered 2015-07-25 – 2015-07-30 (×6): 7 mg via ORAL
  Filled 2015-07-24 (×7): qty 2

## 2015-07-24 MED ORDER — INSULIN ASPART 100 UNIT/ML ~~LOC~~ SOLN
0.0000 [IU] | Freq: Three times a day (TID) | SUBCUTANEOUS | Status: DC
  Administered 2015-07-25: 20 [IU] via SUBCUTANEOUS
  Administered 2015-07-25 – 2015-07-26 (×2): 15 [IU] via SUBCUTANEOUS
  Administered 2015-07-26 (×2): 11 [IU] via SUBCUTANEOUS
  Administered 2015-07-28: 7 [IU] via SUBCUTANEOUS
  Administered 2015-07-30: 10 [IU] via SUBCUTANEOUS
  Administered 2015-07-31: 7 [IU] via SUBCUTANEOUS
  Administered 2015-07-31: 20 [IU] via SUBCUTANEOUS
  Administered 2015-07-31: 4 [IU] via SUBCUTANEOUS
  Administered 2015-08-01: 7 [IU] via SUBCUTANEOUS
  Administered 2015-08-01: 4 [IU] via SUBCUTANEOUS
  Administered 2015-08-02 – 2015-08-03 (×3): 3 [IU] via SUBCUTANEOUS
  Administered 2015-08-04: 20 [IU] via SUBCUTANEOUS

## 2015-07-24 MED ORDER — MOMETASONE FURO-FORMOTEROL FUM 200-5 MCG/ACT IN AERO
2.0000 | INHALATION_SPRAY | Freq: Two times a day (BID) | RESPIRATORY_TRACT | Status: DC
  Administered 2015-07-25 – 2015-07-27 (×5): 2 via RESPIRATORY_TRACT
  Filled 2015-07-24: qty 8.8

## 2015-07-24 MED ORDER — SODIUM CHLORIDE 0.9 % IV SOLN: 250.0000 mL | INTRAVENOUS | Status: DC | PRN

## 2015-07-24 MED ORDER — BISACODYL 10 MG RE SUPP
10.0000 mg | Freq: Every day | RECTAL | Status: DC | PRN
  Administered 2015-07-28 – 2015-08-05 (×3): 10 mg via RECTAL
  Filled 2015-07-24 (×3): qty 1

## 2015-07-24 MED ORDER — SODIUM CHLORIDE 0.9% FLUSH
3.0000 mL | Freq: Two times a day (BID) | INTRAVENOUS | Status: DC
  Administered 2015-07-25 – 2015-07-31 (×5): 3 mL via INTRAVENOUS

## 2015-07-24 MED ORDER — MONTELUKAST SODIUM 10 MG PO TABS
10.0000 mg | ORAL_TABLET | Freq: Every day | ORAL | Status: DC
  Administered 2015-07-25 – 2015-08-08 (×16): 10 mg via ORAL
  Filled 2015-07-24 (×16): qty 1

## 2015-07-24 MED ORDER — POTASSIUM CHLORIDE CRYS ER 20 MEQ PO TBCR
20.0000 meq | EXTENDED_RELEASE_TABLET | Freq: Every day | ORAL | Status: DC
  Administered 2015-07-25 – 2015-07-30 (×5): 20 meq via ORAL
  Filled 2015-07-24 (×6): qty 1

## 2015-07-24 MED ORDER — ATORVASTATIN CALCIUM 40 MG PO TABS
40.0000 mg | ORAL_TABLET | Freq: Every day | ORAL | Status: DC
  Administered 2015-07-25 – 2015-08-08 (×14): 40 mg via ORAL
  Filled 2015-07-24 (×14): qty 1

## 2015-07-24 MED ORDER — TAMSULOSIN HCL 0.4 MG PO CAPS
0.4000 mg | ORAL_CAPSULE | Freq: Every day | ORAL | Status: DC
  Administered 2015-07-25 – 2015-08-08 (×15): 0.4 mg via ORAL
  Filled 2015-07-24 (×16): qty 1

## 2015-07-24 MED ORDER — SENNOSIDES-DOCUSATE SODIUM 8.6-50 MG PO TABS
1.0000 | ORAL_TABLET | Freq: Every evening | ORAL | Status: DC | PRN
  Administered 2015-07-26 – 2015-07-31 (×2): 1 via ORAL
  Filled 2015-07-24 (×2): qty 1

## 2015-07-24 MED ORDER — CALCITRIOL 0.25 MCG PO CAPS
0.2500 ug | ORAL_CAPSULE | ORAL | Status: DC
  Administered 2015-07-26 – 2015-08-09 (×7): 0.25 ug via ORAL
  Filled 2015-07-24 (×7): qty 1

## 2015-07-24 MED ORDER — INSULIN ASPART PROT & ASPART (70-30 MIX) 100 UNIT/ML ~~LOC~~ SUSP
75.0000 [IU] | Freq: Two times a day (BID) | SUBCUTANEOUS | Status: DC
  Administered 2015-07-25 (×2): 75 [IU] via SUBCUTANEOUS
  Filled 2015-07-24: qty 10

## 2015-07-24 NOTE — H&P (Signed)
Jacob [name] is an 75 y.o. male.   Chief Complaint: pain weakness lower extremities, lower back HPI: whom has been displaying lower extremity weakness since last week ~tuesday. Had a kidney ablation this past Monday for ca. Has fallen a few times in the past week. [place] this morning while in the bathroom lodging himself between the toilet and bathtub. His daughter extricated him thought it took an hour. Taken to the [place], then transferred to [facility]. Mri showed severe lumbar stenosis with associated L3/4 hnp. Plan had been to transfer him to [facility] for surgery. However family had wanted him transferred to [facility] for any procedures.  Past Medical History  Diagnosis Date  . Allergy   . Asthma   . Diabetes mellitus without complication ([place])   . Cataract   . Arthritis   . Polymyalgia rheumatica ([facility])     maintained on Prednisone, Plaquenil. Followed by rhuematology every 4 months/[name].  Marland Kitchen COPD (chronic obstructive pulmonary disease) ([place])   . Anemia   . Hypertension   . BPH (benign prostatic hyperplasia)   . Sleep apnea     CPAP   Trying to use  . Shortness of breath dyspnea     with exertion  . Anxiety   . Chronic kidney disease     chronic  kidney failure  kidney function at 42%  . Coronary artery disease     CABG  7 bypasses  . Hepatitis     many years ago  . Cancer of kidney [facility])     Past Surgical History  Procedure Laterality Date  . Appendectomy    . Hernia repair    . Prostate surgery      TURP at [facility].  Marland Kitchen Coronary artery bypass graft  03/17/1995    Wynonia Lawman; followed every six months.  . Cataract extraction w/phaco Right 11/28/2014    Procedure: CATARACT EXTRACTION PHACO AND INTRAOCULAR LENS PLACEMENT (IOC) RIGHT ;  Surgeon: Marylynn Pearson, MD;  Location: [place];  Service: Ophthalmology;  Laterality: Right;    No family history on file. Social History:  reports that he quit smoking about 58 years ago. He has never used smokeless tobacco. He reports that he does not  drink alcohol or use illicit drugs.  Allergies:  Allergies  Allergen Reactions  . Ace Inhibitors Other (See Comments)    Probably nausea and vomiting per patient   . Ciprocinonide [Fluocinolone] Other (See Comments)    Probably nausea and vomiting per patient  . Flunisolide Other (See Comments)    Probably nausea and vomiting per patient   . Metformin And Related Other (See Comments)    Probably nausea and vomiting per patient   . Sertraline Other (See Comments)    Probably nausea and vomiting per patient   . Sulindac Other (See Comments)    Probably nausea and vomiting per patient   . Terazosin Other (See Comments)    Probably nausea and vomiting per patient     Medications Prior to Admission  Medication Sig Dispense Refill  . albuterol (PROVENTIL HFA;VENTOLIN HFA) 108 (90 BASE) MCG/ACT inhaler Inhale 2 puffs into the lungs every 6 (six) hours as needed for wheezing or shortness of breath. 1 Inhaler 2  . allopurinol (ZYLOPRIM) 300 MG tablet Take 300 mg by mouth daily with lunch.    Marland Kitchen amLODipine (NORVASC) 10 MG tablet Take 10 mg by mouth daily with supper.     Marland Kitchen aspirin EC 81 MG tablet Take 81 mg by mouth daily  with lunch.    . Brinzolamide-Brimonidine (SIMBRINZA) 1-0.2 % SUSP Apply 1 drop to eye 2 (two) times daily.    . budesonide-formoterol (SYMBICORT) 160-4.5 MCG/ACT inhaler Inhale 2 puffs into the lungs 2 (two) times daily.    . calcitRIOL (ROCALTROL) 0.25 MCG capsule Take 0.25 mcg by mouth every Monday, Wednesday, and Friday.    . cetirizine (ZYRTEC) 10 MG tablet Take 1 tablet (10 mg total) by mouth daily. (Patient not taking: Reported on 11/26/2014) 30 tablet 11  . cycloSPORINE (RESTASIS) 0.05 % ophthalmic emulsion Place 1 drop into both eyes 2 (two) times daily.    . finasteride (PROSCAR) 5 MG tablet Take 5 mg by mouth at bedtime.     . Fluticasone-Salmeterol (ADVAIR) 100-50 MCG/DOSE AEPB Inhale 1 puff into the lungs 2 (two) times daily. (Patient not taking: Reported on  11/26/2014) 3 each 3  . furosemide (LASIX) 20 MG tablet Take 1 tablet (20 mg total) by mouth 2 (two) times daily. , as needed. NO MORE REFILLS WITHOUT OFFICE VISIT - 2ND NOTICE (Patient not taking: Reported on 04/25/2015) 30 tablet 0  . gabapentin (NEURONTIN) 300 MG capsule Take 1 capsule (300 mg total) by mouth 3 (three) times daily. PATIENT NEEDS OFFICE VISIT FOR ADDITIONAL REFILLS (Patient taking differently: Take 300 mg by mouth 3 (three) times daily. ) 90 capsule 0  . insulin aspart protamine- aspart (NOVOLOG MIX 70/30) (70-30) 100 UNIT/ML injection Inject 75 Units into the skin 2 (two) times daily.     Marland Kitchen ipratropium-albuterol (DUONEB) 0.5-2.5 (3) MG/3ML SOLN Take 3 mLs by nebulization every 6 (six) hours as needed (shortness of breath/wheezing). Reported on 03/27/2015    . Liraglutide (VICTOZA) 18 MG/3ML SOPN Inject 1.2 mg into the skin daily at 3 pm.     . losartan (COZAAR) 100 MG tablet Take 100 mg by mouth at bedtime.     . metoprolol (LOPRESSOR) 50 MG tablet Take 25 mg by mouth 2 (two) times daily.    . montelukast (SINGULAIR) 10 MG tablet Take 1 tablet (10 mg total) by mouth at bedtime. 90 tablet 3  . Multiple Vitamins-Minerals (PRESERVISION AREDS 2 PO) Take 1 tablet by mouth 2 (two) times daily.     . pilocarpine (PILOCAR) 2 % ophthalmic solution Place 1 drop into both eyes 3 (three) times daily.    . potassium chloride SA (K-DUR,KLOR-CON) 20 MEQ tablet Take 20 mEq by mouth daily.    . predniSONE (DELTASONE) 1 MG tablet Take 2 mg by mouth daily with breakfast.    . predniSONE (DELTASONE) 5 MG tablet Take 5 mg by mouth daily with breakfast.    . risedronate (ACTONEL) 150 MG tablet Take 150 mg by mouth every 30 (thirty) days. 18 th of every month.    . simvastatin (ZOCOR) 80 MG tablet Take 40 mg by mouth daily with supper.    . sodium chloride 0.9 % nebulizer solution Take 3 mLs by nebulization every 6 (six) hours as needed for wheezing.    . Tamsulosin HCl (FLOMAX) 0.4 MG CAPS Take 0.4 mg by  mouth at bedtime.     Marland Kitchen tiZANidine (ZANAFLEX) 4 MG capsule Take 4 mg by mouth 3 (three) times daily as needed for muscle spasms.      No results found for this or any previous visit (from the past 48 hour(s)). No results found.  Review of Systems  HENT: Negative.   Eyes: Negative.   Respiratory: Positive for shortness of breath and wheezing.   Cardiovascular: Negative.  Gastrointestinal: Negative.   Genitourinary:       Renal cell cA  Musculoskeletal: Positive for back pain and falls.  Skin: Negative.   Neurological: Positive for focal weakness and weakness.  Endo/Heme/Allergies: Bruises/bleeds easily.  Psychiatric/Behavioral: Negative.     Blood pressure 179/99, pulse 124, temperature 98.6 F (37 C), temperature source Oral, resp. rate 18, height 5\' 11"  (1.803 m), weight 105.824 kg (233 lb 4.8 oz), SpO2 97 %. Physical Exam  Constitutional: He is oriented to person, place, and time. He appears well-developed and well-nourished.  HENT:  Head: Normocephalic and atraumatic.  Right Ear: External ear normal.  Left Ear: External ear normal.  Eyes: Conjunctivae and EOM are normal. Pupils are equal, round, and reactive to light.  Neck: Normal range of motion. Neck supple.  Cardiovascular: Normal rate and regular rhythm.   Respiratory: Effort normal.  GI: Soft. Bowel sounds are normal.  Musculoskeletal: Normal range of motion.  Neurological: He is alert and oriented to person, place, and time. He displays no atrophy and no tremor. A sensory deficit is present. No cranial nerve deficit. He exhibits normal muscle tone. He displays no seizure activity. He displays no Babinski's sign on the right side. He displays no Babinski's sign on the left side.  Weakness left lower extremity greater than right All muscle groups tested, hip flexors, extensors, quadriceps, hamstrings, dorsiflexors, and plantar flexors. 4- on left, 4+ on right.  Normal strength in upper extremities.  Mildly decreased  proprioception right great toe, normal in upper extremities Light touch intact No clonus, no hoffman's Gait not assessed.     Assessment/Plan Mr. Riofrio has by his daughter's report has fairly severe COPD. I have requested that she gather his records from the [place], and epic may have more information. But I will have pulmonary consulted to assess his fitness for surgery. This would be a lumbar laminectomy and discetomy to decompress the spinal canal at L3/4  Bentlie Withem L, MD 07/24/2015, 11:32 PM

## 2015-07-24 NOTE — Progress Notes (Addendum)
Patient arrived to 5C02 AAOx4. Pt's daughter at bedside and claims patient fell last evening in the bathroom between the toilet and the wall. Patient lethargic at this time. Patient came with Foley catheter in place, right AC IV. He is on 2L o2 and complaining of 10/10 pain in his back. Vitals taken and MD paged. Waiting for orders. Will continue to monitor. Aadya Kindler, Rande Brunt, RN   In report I was told patient has last received 4mg  Morphine at 2030.

## 2015-07-25 ENCOUNTER — Inpatient Hospital Stay (HOSPITAL_COMMUNITY): Payer: PPO

## 2015-07-25 ENCOUNTER — Encounter (HOSPITAL_COMMUNITY): Payer: Self-pay | Admitting: *Deleted

## 2015-07-25 LAB — CBC WITH DIFFERENTIAL/PLATELET
Basophils Absolute: 0 10*3/uL (ref 0.0–0.1)
Basophils Relative: 0 %
Eosinophils Absolute: 0 10*3/uL (ref 0.0–0.7)
Eosinophils Relative: 0 %
HCT: 41.6 % (ref 39.0–52.0)
Hemoglobin: 13.7 g/dL (ref 13.0–17.0)
Lymphocytes Relative: 4 %
Lymphs Abs: 0.5 10*3/uL — ABNORMAL LOW (ref 0.7–4.0)
MCH: 30.6 pg (ref 26.0–34.0)
MCHC: 32.9 g/dL (ref 30.0–36.0)
MCV: 92.9 fL (ref 78.0–100.0)
Monocytes Absolute: 0.3 10*3/uL (ref 0.1–1.0)
Monocytes Relative: 2 %
Neutro Abs: 11.9 10*3/uL — ABNORMAL HIGH (ref 1.7–7.7)
Neutrophils Relative %: 94 %
Platelets: 177 10*3/uL (ref 150–400)
RBC: 4.48 MIL/uL (ref 4.22–5.81)
RDW: 15.5 % (ref 11.5–15.5)
WBC: 12.7 10*3/uL — ABNORMAL HIGH (ref 4.0–10.5)

## 2015-07-25 LAB — COMPREHENSIVE METABOLIC PANEL
ALT: 17 U/L (ref 17–63)
AST: 23 U/L (ref 15–41)
Albumin: 3.4 g/dL — ABNORMAL LOW (ref 3.5–5.0)
Alkaline Phosphatase: 73 U/L (ref 38–126)
Anion gap: 12 (ref 5–15)
BUN: 26 mg/dL — ABNORMAL HIGH (ref 6–20)
CO2: 24 mmol/L (ref 22–32)
Calcium: 10.1 mg/dL (ref 8.9–10.3)
Chloride: 101 mmol/L (ref 101–111)
Creatinine, Ser: 1.91 mg/dL — ABNORMAL HIGH (ref 0.61–1.24)
GFR calc Af Amer: 38 mL/min — ABNORMAL LOW (ref >60)
GFR calc non Af Amer: 33 mL/min — ABNORMAL LOW (ref >60)
Glucose, Bld: 361 mg/dL — ABNORMAL HIGH (ref 65–99)
Potassium: 4.7 mmol/L (ref 3.5–5.1)
Sodium: 137 mmol/L (ref 135–145)
Total Bilirubin: 1.9 mg/dL — ABNORMAL HIGH (ref 0.3–1.2)
Total Protein: 6.9 g/dL (ref 6.5–8.1)

## 2015-07-25 LAB — GLUCOSE, CAPILLARY
Glucose-Capillary: 420 mg/dL — ABNORMAL HIGH (ref 65–99)
Glucose-Capillary: 421 mg/dL — ABNORMAL HIGH (ref 65–99)
Glucose-Capillary: 309 mg/dL — ABNORMAL HIGH (ref 65–99)
Glucose-Capillary: 309 mg/dL — ABNORMAL HIGH (ref 65–99)
Glucose-Capillary: 388 mg/dL — ABNORMAL HIGH (ref 65–99)

## 2015-07-25 LAB — APTT: aPTT: 31 seconds (ref 24–37)

## 2015-07-25 LAB — PROTIME-INR
INR: 1.06 (ref 0.00–1.49)
Prothrombin Time: 14 seconds (ref 11.6–15.2)

## 2015-07-25 MED ORDER — INSULIN ASPART PROT & ASPART (70-30 MIX) 100 UNIT/ML ~~LOC~~ SUSP
80.0000 [IU] | Freq: Two times a day (BID) | SUBCUTANEOUS | Status: DC
  Administered 2015-07-26 – 2015-08-01 (×8): 80 [IU] via SUBCUTANEOUS
  Filled 2015-07-25 (×4): qty 10

## 2015-07-25 NOTE — Progress Notes (Signed)
Patient ID: Jacob [name], male   DOB: [birth year], 75 y.o.   MRN: [name] BP 154/83 mmHg  Pulse 77  Temp(Src) 98.2 F (36.8 C) (Oral)  Resp 18  Ht 5\' 11"  (1.803 m)  Wt 105.824 kg (233 lb 4.8 oz)  BMI 32.55 kg/m2  SpO2 100% Alert and oriented x 4 Pulmonary has started their evaluation Jacob [name] is comfortable today May be out of bed.  Remains weak lower extremities

## 2015-07-25 NOTE — Consult Note (Signed)
Name: Jacob [name] MRN: [name] DOB: 10-Feb-1941    ADMISSION DATE:  07/24/2015 CONSULTATION DATE:  5/11  REFERRING MD :  Dr. Christella [name]  CHIEF COMPLAINT:  fall  BRIEF PATIENT DESCRIPTION: 75 year old male with COPD, pulmonary sarcoid, and OSA admitted to [facility] after fall. Requires lumbar laminectomy and discectomy. Pulmonary consulted for pre-op assessment.   SIGNIFICANT EVENTS    STUDIES:  MRI L spine 5/9 > Multilevel degenerative changes of the spine with varying degrees of central canal and foraminal stenosis as described above. Most notably there is marked central canal stenosis L3/L4 secondary to disc bulge and superimposed disc extrusion. Additionally, moderate central canal stenosis L4/L5 with a right foraminal disc extrusion resulting in moderate right foraminal stenosis. L5/S1 small central disc extrusion with mild to moderate central canal stenosis.   HISTORY OF PRESENT ILLNESS:  75 year old male with PMH as below, which patient reports includes COPD, Pulmonary sarcoidosis by biopsy years ago and he has never had a flare, and OSA for which he had been trying CPAP. At baseline (before starting to have back pain due to present complaint about 1.5 weeks ago) he was able to ambulate around house a grocery store without difficulty. He does not wear oxygen at home. He does take prednisone, but this is for polymyalgia rheumatica, and not any pulmonary issue. 05/2014 he was diagnosed with renal carcinoma, for which he underwent biopsy and ablation 07/23/2015. During this process, a physician apparently told him that because of his lung disease he would never come off of the ventilator.   Approximately 5/2 he began to experience lower extremity weakness which has caused him to fall a few times in that one week period. 5/10 he fell in between the bathroom and toilet, which took him an hour to get up from with his daughter's help. They presented to [facility] then to [facility] for MRI, which discovered  several areas of spinal stenosis, most notably L3-S1. Family requested that he be transferred to [facility] for any procedures. He was seen by neurosurgery who are recommending lumbar laminectomy and discetomy to decompress the spinal canal at L3/4. PCCM has been consulted for pre-op evaluation.    PAST MEDICAL HISTORY :   has a past medical history of Allergy; Asthma; Diabetes mellitus without complication ([name]); Cataract; Arthritis; Polymyalgia rheumatica (HCC); COPD (chronic obstructive pulmonary disease) ([place]); Anemia; Hypertension; BPH (benign prostatic hyperplasia); Sleep apnea; Shortness of breath dyspnea; Anxiety; Chronic kidney disease; Coronary artery disease; Hepatitis; and Cancer of kidney ([place]).  has past surgical history that includes Appendectomy; Hernia repair; Prostate surgery; Coronary artery bypass graft (03/17/1995); and Cataract extraction w/PHACO (Right, 11/28/2014). Prior to Admission medications   Medication Sig Start Date End Date Taking? Authorizing Provider  albuterol (PROVENTIL HFA;VENTOLIN HFA) 108 (90 BASE) MCG/ACT inhaler Inhale 2 puffs into the lungs every 6 (six) hours as needed for wheezing or shortness of breath. 07/16/13  Yes Wardell Honour, MD  allopurinol (ZYLOPRIM) 300 MG tablet Take 300 mg by mouth daily with lunch.   Yes Historical Provider, MD  amLODipine (NORVASC) 10 MG tablet Take 10 mg by mouth daily with supper.    Yes Historical Provider, MD  aspirin EC 81 MG tablet Take 81 mg by mouth daily with lunch.   Yes Historical Provider, MD  Brinzolamide-Brimonidine [facility]) 1-0.2 % SUSP Place 1 drop into both eyes 2 (two) times daily.    Yes Historical Provider, MD  budesonide-formoterol (SYMBICORT) 160-4.5 MCG/ACT inhaler Inhale 2 puffs into the  lungs 2 (two) times daily.   Yes Historical Provider, MD  calcitRIOL (ROCALTROL) 0.25 MCG capsule Take 0.25 mcg by mouth every Monday, Wednesday, and Friday.   Yes Historical Provider, MD  cycloSPORINE (RESTASIS) 0.05 %  ophthalmic emulsion Place 1 drop into both eyes 2 (two) times daily.   Yes Historical Provider, MD  finasteride (PROSCAR) 5 MG tablet Take 5 mg by mouth at bedtime.    Yes Historical Provider, MD  gabapentin (NEURONTIN) 300 MG capsule Take 1 capsule (300 mg total) by mouth 3 (three) times daily. PATIENT NEEDS OFFICE VISIT FOR ADDITIONAL REFILLS Patient taking differently: Take 300 mg by mouth 3 (three) times daily.  01/23/13  Yes Wendie Agreste, MD  insulin aspart protamine- aspart (NOVOLOG MIX 70/30) (70-30) 100 UNIT/ML injection Inject 75 Units into the skin 2 (two) times daily before a meal.    Yes Historical Provider, MD  ipratropium-albuterol (DUONEB) 0.5-2.5 (3) MG/3ML SOLN Take 3 mLs by nebulization every 6 (six) hours as needed (shortness of breath/wheezing). Reported on 03/27/2015   Yes Historical Provider, MD  Liraglutide (VICTOZA) 18 MG/3ML SOPN Inject 1.2 mg into the skin daily at 3 pm.    Yes Historical Provider, MD  losartan (COZAAR) 100 MG tablet Take 100 mg by mouth every morning.    Yes Historical Provider, MD  metoprolol (LOPRESSOR) 50 MG tablet Take 25 mg by mouth 2 (two) times daily.   Yes Historical Provider, MD  montelukast (SINGULAIR) 10 MG tablet Take 1 tablet (10 mg total) by mouth at bedtime. 07/27/13  Yes Wardell Honour, MD  Multiple Vitamins-Minerals (PRESERVISION AREDS 2 PO) Take 1 tablet by mouth 2 (two) times daily.    Yes Historical Provider, MD  pilocarpine (PILOCAR) 2 % ophthalmic solution Place 1 drop into both eyes 3 (three) times daily.   Yes Historical Provider, MD  potassium chloride SA (K-DUR,KLOR-CON) 20 MEQ tablet Take 20 mEq by mouth daily.   Yes Historical Provider, MD  predniSONE (DELTASONE) 1 MG tablet Take 2 mg by mouth daily with breakfast. IN CONJUNCTION WITH ONE 5 MG TABLET TO EQUAL A TOTAL OF 7 MILLIGRAMS   Yes Historical Provider, MD  predniSONE (DELTASONE) 5 MG tablet Take 5 mg by mouth daily with breakfast. IN CONJUNCTION WITH TWO 1 MG TABLETS TO  EQUAL A TOTAL OF 7 MILLIGRAMS   Yes Historical Provider, MD  simvastatin (ZOCOR) 80 MG tablet Take 40 mg by mouth daily with supper.   Yes Historical Provider, MD  sodium chloride 0.9 % nebulizer solution Take 3 mLs by nebulization every 6 (six) hours as needed for wheezing.   Yes Historical Provider, MD  Tamsulosin HCl (FLOMAX) 0.4 MG CAPS Take 0.4 mg by mouth at bedtime.    Yes Historical Provider, MD  tiZANidine (ZANAFLEX) 4 MG capsule Take 4 mg by mouth 3 (three) times daily as needed for muscle spasms.   Yes Historical Provider, MD  furosemide (LASIX) 20 MG tablet Take 1 tablet (20 mg total) by mouth 2 (two) times daily. , as needed. NO MORE REFILLS WITHOUT OFFICE VISIT - 2ND NOTICE Patient not taking: Reported on 04/25/2015 02/18/15   Wardell Honour, MD  oxyCODONE (ROXICODONE) 15 MG immediate release tablet Take 15 mg by mouth every 4 (four) hours as needed.    Historical Provider, MD   Allergies  Allergen Reactions  . Ace Inhibitors Other (See Comments)    Probably nausea and vomiting per patient   . Actonel [Risedronate] Nausea And Vomiting  . Ciprocinonide [  Fluocinolone] Other (See Comments)    Probably nausea and vomiting per patient  . Flunisolide Other (See Comments)    Probably nausea and vomiting per patient   . Metformin And Related Other (See Comments)    Probably nausea and vomiting per patient   . Sertraline Other (See Comments)    Probably nausea and vomiting per patient   . Sulindac Other (See Comments)    Probably nausea and vomiting per patient   . Terazosin Other (See Comments)    Probably nausea and vomiting per patient     FAMILY HISTORY:  family history is not on file. SOCIAL HISTORY:  reports that he quit smoking about 58 years ago. He has never used smokeless tobacco. He reports that he does not drink alcohol or use illicit drugs.  REVIEW OF SYSTEMS:   Bolds are positive  Constitutional: weight loss, gain, night sweats, Fevers, chills, fatigue .    HEENT: headaches, Sore throat, sneezing, nasal congestion, post nasal drip, Difficulty swallowing, Tooth/dental problems, visual complaints visual changes, ear ache CV:  chest pain, radiates: ,Orthopnea, PND, swelling in lower extremities, dizziness, palpitations, syncope.  GI  heartburn, indigestion, abdominal pain, nausea, vomiting, diarrhea, change in bowel habits, loss of appetite, bloody stools.  Resp: cough, productive: , hemoptysis, dyspnea, chest pain, pleuritic.  Skin: rash or itching or icterus GU: dysuria, change in color of urine, urgency or frequency. flank pain, hematuria  MS: joint pain or swelling. decreased range of motion  Psych: change in mood or affect. depression or anxiety.  Neuro: difficulty with speech, weakness, numbness, ataxia   SUBJECTIVE:   VITAL SIGNS: Temp:  [97.8 F (36.6 C)-98.8 F (37.1 C)] 98.2 F (36.8 C) (05/11 1713) Pulse Rate:  [72-124] 77 (05/11 1713) Resp:  [14-18] 18 (05/11 1713) BP: (135-179)/(64-99) 154/83 mmHg (05/11 1713) SpO2:  [96 %-100 %] 100 % (05/11 1713) Weight:  [105.824 kg (233 lb 4.8 oz)] 105.824 kg (233 lb 4.8 oz) (05/10 2217)  PHYSICAL EXAMINATION: General:  Elderly male in NAD Neuro:  Mild confusion which he attributes to pain medication HEENT:  [place]/AT, PERRL, no JVD Cardiovascular:  RRR, no MRG Lungs:  Clear bilateral breath sounds Abdomen:  Soft, non-tender, non-distended Musculoskeletal:  No acute deformity Skin:  Grossly intact   Recent Labs Lab 07/25/15 0005  NA 137  K 4.7  CL 101  CO2 24  BUN 26*  CREATININE 1.91*  GLUCOSE 361*    Recent Labs Lab 07/25/15 0005  HGB 13.7  HCT 41.6  WBC 12.7*  PLT 177   No results found.  ASSESSMENT / PLAN:  Discussion: It is difficult to truly determine the extent of his pulmonary disease. Available records from [place] do not contain pulmonology reports.  We have no PFTs to go off of. He reports COPD and asthma. Unclear whether or not he actually has COPD because he  only smoked for 6 years in his teens. A remote history of sarcoidosis without ever having a flare, and hospital records indicate OSA. At baseline it sounds as though the patient is not overly limited by pulmonary symptoms. Will obtain spirometry and CXR to better assess. I have explained this to the patient and his family.   COPD without acute exacerbation Pulmonary sarcoidosis  Asthma without acute exacerbation Obstructive sleep apnea - keep SpO2 > 88% - Nocturnal CPAP as tolerated - Assess CXR - Assess spirometry - Continue home Symbicort, prn albuterol, prn duoneb - Family attempting to obtain PFT results from OSH  Severe lumbar  stenosis  - per neurosurgery   Georgann Housekeeper, AGACNP-BC [facility] Pulmonology/Critical Care Pager [phone] or [phone]  07/25/2015 7:19 PM

## 2015-07-25 NOTE — Progress Notes (Signed)
Results for KIMI, KARSTENS (MRN [id number]) as of 07/25/2015 12:58  Ref. Range 07/25/2015 06:04 07/25/2015 06:46 07/25/2015 11:35  Glucose-Capillary Latest Ref Range: 65-99 mg/dL 420 (H) 421 (H) 309 (H)  Noted that blood sugars continue to be elevated. Recommend increasing 70/30 insulin to 80 units BID and continuing Novolog RESISTANT scale TID and adding Novolog HS scale. If patient has surgery, may need to start on glucostabilizer for surgery and change to Lantus for transition off drip until patient is able to eat.  Lantus dose in comparison to home 70/30 dose would be 40 units BID. Will continue to monitor blood sugars while in the hospital. Harvel Ricks RN BSN CDE

## 2015-07-25 NOTE — Care Management Note (Signed)
Case Management Note  Patient Details  Name: Jacob [name] MRN: [id number] Date of Birth: May 10, 1940  Subjective/Objective:  Pt admitted with a fall and herniated nucleus pulposus. He is from home with his wife.                   Action/Plan: Awaiting pulmonary consult for possible surgery. CM following for discharge disposition.   Expected Discharge Date:                  Expected Discharge Plan:     In-House Referral:     Discharge planning Services     Post Acute Care Choice:    Choice offered to:     DME Arranged:    DME Agency:     HH Arranged:    HH Agency:     Status of Service:  In process, will continue to follow  Medicare Important Message Given:    Date Medicare IM Given:    Medicare IM give by:    Date Additional Medicare IM Given:    Additional Medicare Important Message give by:     If discussed at [place] of Stay Meetings, dates discussed:    Additional Comments:  Pollie Friar, RN 07/25/2015, 10:44 AM

## 2015-07-25 NOTE — Progress Notes (Signed)
CBG 420 this am. MD paged. Kinnie Kaupp, Rande Brunt, RN

## 2015-07-26 ENCOUNTER — Other Ambulatory Visit: Payer: Self-pay

## 2015-07-26 ENCOUNTER — Inpatient Hospital Stay (HOSPITAL_COMMUNITY): Payer: PPO

## 2015-07-26 DIAGNOSIS — I255 Ischemic cardiomyopathy: Secondary | ICD-10-CM

## 2015-07-26 LAB — GLUCOSE, CAPILLARY
Glucose-Capillary: 327 mg/dL — ABNORMAL HIGH (ref 65–99)
Glucose-Capillary: 266 mg/dL — ABNORMAL HIGH (ref 65–99)
Glucose-Capillary: 181 mg/dL — ABNORMAL HIGH (ref 65–99)
Glucose-Capillary: 157 mg/dL — ABNORMAL HIGH (ref 65–99)

## 2015-07-26 LAB — SPIROMETRY WITH GRAPH
FEF 25-75 Pre: 2.6 L/sec
FEF2575-%Pred-Pre: 113 %
FEV1-%Pred-Pre: 62 %
FEV1-Pre: 1.98 L
FEV1FVC-%Pred-Pre: 118 %
FEV6-%Pred-Pre: 56 %
FEV6-Pre: 2.32 L
FEV6FVC-%Pred-Pre: 106 %
FVC-%Pred-Pre: 52 %
FVC-Pre: 2.32 L
Pre FEV1/FVC ratio: 85 %
Pre FEV6/FVC Ratio: 100 %

## 2015-07-26 MED ORDER — ALBUTEROL SULFATE (2.5 MG/3ML) 0.083% IN NEBU: 2.5000 mg | INHALATION_SOLUTION | RESPIRATORY_TRACT | Status: DC | PRN

## 2015-07-26 MED ORDER — IPRATROPIUM-ALBUTEROL 0.5-2.5 (3) MG/3ML IN SOLN
3.0000 mL | Freq: Four times a day (QID) | RESPIRATORY_TRACT | Status: DC
  Administered 2015-07-26 (×3): 3 mL via RESPIRATORY_TRACT
  Filled 2015-07-26: qty 3
  Filled 2015-07-26: qty 6

## 2015-07-26 MED ORDER — IPRATROPIUM-ALBUTEROL 0.5-2.5 (3) MG/3ML IN SOLN
3.0000 mL | Freq: Three times a day (TID) | RESPIRATORY_TRACT | Status: DC
  Administered 2015-07-27: 3 mL via RESPIRATORY_TRACT
  Filled 2015-07-26: qty 3

## 2015-07-26 NOTE — Progress Notes (Signed)
Patient refusing CPAP.

## 2015-07-26 NOTE — Progress Notes (Signed)
Patient ID: Jacob [name], male   DOB: 1940-06-23, 74 y.o.   MRN: [name] BP 105/50 mmHg  Pulse 57  Temp(Src) 98.2 F (36.8 C) (Oral)  Resp 18  Ht 5\' 11"  (1.803 m)  Wt 105.824 kg (233 lb 4.8 oz)  BMI 32.55 kg/m2  SpO2 98% Appreciate pulmonary and their quick response Will plan on or early next week. No neurologic changes. Weak in lower extremities.

## 2015-07-26 NOTE — Progress Notes (Signed)
CM met with the patient and he states that his VA insurance should be the primary insurance for the visit. Card copied and faxed to the financial counseling department. CM will continue to follow.

## 2015-07-26 NOTE — Progress Notes (Signed)
  Echocardiogram 2D Echocardiogram has been performed.  Jacob [name] 07/26/2015, 12:15 PM

## 2015-07-27 ENCOUNTER — Inpatient Hospital Stay (HOSPITAL_COMMUNITY): Payer: PPO

## 2015-07-27 DIAGNOSIS — Z01811 Encounter for preprocedural respiratory examination: Secondary | ICD-10-CM

## 2015-07-27 DIAGNOSIS — G4733 Obstructive sleep apnea (adult) (pediatric): Secondary | ICD-10-CM

## 2015-07-27 DIAGNOSIS — I25810 Atherosclerosis of coronary artery bypass graft(s) without angina pectoris: Secondary | ICD-10-CM

## 2015-07-27 DIAGNOSIS — E1142 Type 2 diabetes mellitus with diabetic polyneuropathy: Secondary | ICD-10-CM

## 2015-07-27 DIAGNOSIS — Z794 Long term (current) use of insulin: Secondary | ICD-10-CM

## 2015-07-27 DIAGNOSIS — R748 Abnormal levels of other serum enzymes: Secondary | ICD-10-CM

## 2015-07-27 DIAGNOSIS — N289 Disorder of kidney and ureter, unspecified: Secondary | ICD-10-CM

## 2015-07-27 DIAGNOSIS — M5126 Other intervertebral disc displacement, lumbar region: Secondary | ICD-10-CM

## 2015-07-27 HISTORY — DX: Obstructive sleep apnea (adult) (pediatric): G47.33

## 2015-07-27 LAB — CBC WITH DIFFERENTIAL/PLATELET
Basophils Absolute: 0 10*3/uL (ref 0.0–0.1)
Basophils Relative: 0 %
Eosinophils Absolute: 0.1 10*3/uL (ref 0.0–0.7)
Eosinophils Relative: 1 %
HCT: 38.5 % — ABNORMAL LOW (ref 39.0–52.0)
Hemoglobin: 12.2 g/dL — ABNORMAL LOW (ref 13.0–17.0)
Lymphocytes Relative: 9 %
Lymphs Abs: 0.8 10*3/uL (ref 0.7–4.0)
MCH: 30.3 pg (ref 26.0–34.0)
MCHC: 31.7 g/dL (ref 30.0–36.0)
MCV: 95.5 fL (ref 78.0–100.0)
Monocytes Absolute: 0.8 10*3/uL (ref 0.1–1.0)
Monocytes Relative: 8 %
Neutro Abs: 7.7 10*3/uL (ref 1.7–7.7)
Neutrophils Relative %: 82 %
Platelets: 204 10*3/uL (ref 150–400)
RBC: 4.03 MIL/uL — ABNORMAL LOW (ref 4.22–5.81)
RDW: 15.6 % — ABNORMAL HIGH (ref 11.5–15.5)
WBC: 9.3 10*3/uL (ref 4.0–10.5)

## 2015-07-27 LAB — GLUCOSE, CAPILLARY
Glucose-Capillary: 75 mg/dL (ref 65–99)
Glucose-Capillary: 78 mg/dL (ref 65–99)
Glucose-Capillary: 73 mg/dL (ref 65–99)
Glucose-Capillary: 88 mg/dL (ref 65–99)

## 2015-07-27 LAB — BASIC METABOLIC PANEL
Anion gap: 8 (ref 5–15)
BUN: 37 mg/dL — ABNORMAL HIGH (ref 6–20)
CO2: 24 mmol/L (ref 22–32)
Calcium: 9 mg/dL (ref 8.9–10.3)
Chloride: 105 mmol/L (ref 101–111)
Creatinine, Ser: 1.71 mg/dL — ABNORMAL HIGH (ref 0.61–1.24)
GFR calc Af Amer: 43 mL/min — ABNORMAL LOW (ref >60)
GFR calc non Af Amer: 37 mL/min — ABNORMAL LOW (ref >60)
Glucose, Bld: 148 mg/dL — ABNORMAL HIGH (ref 65–99)
Potassium: 4.3 mmol/L (ref 3.5–5.1)
Sodium: 137 mmol/L (ref 135–145)

## 2015-07-27 LAB — ECHOCARDIOGRAM COMPLETE
Height: 71 in
Weight: 3732.8 oz

## 2015-07-27 LAB — MAGNESIUM: Magnesium: 1.9 mg/dL (ref 1.7–2.4)

## 2015-07-27 LAB — TROPONIN I
Troponin I: 0.14 ng/mL — ABNORMAL HIGH (ref <0.031)
Troponin I: 0.14 ng/mL — ABNORMAL HIGH (ref <0.031)

## 2015-07-27 LAB — PHOSPHORUS: Phosphorus: 3.2 mg/dL (ref 2.5–4.6)

## 2015-07-27 MED ORDER — IPRATROPIUM-ALBUTEROL 0.5-2.5 (3) MG/3ML IN SOLN
3.0000 mL | Freq: Four times a day (QID) | RESPIRATORY_TRACT | Status: DC
  Administered 2015-07-27 – 2015-07-29 (×9): 3 mL via RESPIRATORY_TRACT
  Filled 2015-07-27 (×7): qty 3

## 2015-07-27 MED ORDER — ALPRAZOLAM 0.5 MG PO TABS
0.5000 mg | ORAL_TABLET | Freq: Three times a day (TID) | ORAL | Status: DC | PRN
  Administered 2015-07-28 – 2015-07-30 (×6): 0.5 mg via ORAL
  Filled 2015-07-27 (×7): qty 1

## 2015-07-27 MED ORDER — BUDESONIDE 0.25 MG/2ML IN SUSP
0.2500 mg | Freq: Two times a day (BID) | RESPIRATORY_TRACT | Status: DC
  Administered 2015-07-27 – 2015-08-09 (×22): 0.25 mg via RESPIRATORY_TRACT
  Filled 2015-07-27 (×23): qty 2

## 2015-07-27 MED ORDER — IPRATROPIUM-ALBUTEROL 0.5-2.5 (3) MG/3ML IN SOLN: 3.0000 mL | RESPIRATORY_TRACT | Status: DC | PRN

## 2015-07-27 NOTE — Consult Note (Signed)
Reason for Consult: Preop cardiovascular evaluation  Requesting Physician: Christella Noa  Cardiologist: Wynonia Lawman  HPI: This is a 75 y.o. male with a past medical history significant for coronary artery disease requiring 7 vessel bypass surgery almost 20 years ago, but without any new requirements for revascularization since then. He bears a diagnosis of diastolic dysfunction and has treated obstructive sleep apnea as well as a history of pulmonary sarcoidosis and chronic lung disease. He has severe lumbar spine stenosis and will require surgical correction on Monday.   He also has polymyalgia rheumatica on low-dose prednisone, type 2 diabetes mellitus with neuropathy, hyperlipidemia systemic hypertension, chronic kidney disease and prostatism. He had a recent ablation procedure for renal cancer.  He denies angina pectoris in several years. He had some atypical chest pain about 5 years ago and underwent cardiac catheterization at the [facility]. He reports that "all his grafts were open". Within the last couple of years he has had a "chemical stress test" with Dr. Wynonia Lawman that showed good results. He has preserved left ventricular systolic function. Diastolic dysfunction was reported on one of his echocardiograms but he has not had overt congestive heart failure. His echocardiogram today shows normal left ventricular regional wall motion, normal left ventricular ejection fraction and does not really show major signs of diastolic dysfunction other than left atrial enlargement. He is not able to perform intense physical activity due to his back problems. He also has some degree of exertional dyspnea which is attributed merrily to his lung problems.  Electrocardiogram shows sinus rhythm with generalized low voltage, probably due to obesity, questionable inferior Q waves (in lead III and F but not in lead 2) and delayed anterior R-wave progression. There are no repolarization abnormalities to suggest  ischemia. Cardiac enzymes have been checked (chest pain was not a complaint) and show a "plateau" and minimal elevation in cardiac troponin I, not consistent with an acute coronary syndrome.  PMHx:  Past Medical History  Diagnosis Date  . Allergy   . Asthma   . Diabetes mellitus without complication ([place])   . Cataract   . Arthritis   . Polymyalgia rheumatica (HCC)     maintained on Prednisone, Plaquenil. Followed by rhuematology every 4 months/[name].  Marland Kitchen COPD (chronic obstructive pulmonary disease) ([place])   . Anemia   . Hypertension   . BPH (benign prostatic hyperplasia)   . Sleep apnea     CPAP   Trying to use  . Shortness of breath dyspnea     with exertion  . Anxiety   . Chronic kidney disease     chronic  kidney failure  kidney function at 42%  . Coronary artery disease     CABG  7 bypasses  . Hepatitis     many years ago  . Cancer of kidney [facility])    Past Surgical History  Procedure Laterality Date  . Appendectomy    . Hernia repair    . Prostate surgery      TURP at [facility].  Marland Kitchen Coronary artery bypass graft  03/17/1995    Wynonia Lawman; followed every six months.  . Cataract extraction w/phaco Right 11/28/2014    Procedure: CATARACT EXTRACTION PHACO AND INTRAOCULAR LENS PLACEMENT (IOC) RIGHT ;  Surgeon: Marylynn Pearson, MD;  Location: [facility];  Service: Ophthalmology;  Laterality: Right;    FAMHx: History reviewed. No pertinent family history of premature cardiac illness, heart failure, serious arrhythmia, etc.  SOCHx:  reports that he quit smoking about 56  years ago. He has never used smokeless tobacco. He reports that he does not drink alcohol or use illicit drugs.  ALLERGIES: Allergies  Allergen Reactions  . Ace Inhibitors Other (See Comments)    Probably nausea and vomiting per patient   . Actonel [Risedronate] Nausea And Vomiting  . Ciprocinonide [Fluocinolone] Other (See Comments)    Probably nausea and vomiting per patient  . Flunisolide Other (See Comments)    Probably  nausea and vomiting per patient   . Metformin And Related Other (See Comments)    Probably nausea and vomiting per patient   . Sertraline Other (See Comments)    Probably nausea and vomiting per patient   . Sulindac Other (See Comments)    Probably nausea and vomiting per patient   . Terazosin Other (See Comments)    Probably nausea and vomiting per patient     ROS: Pertinent items noted in HPI and remainder of comprehensive ROS otherwise negative.   HOME MEDICATIONS: Prescriptions prior to admission  Medication Sig Dispense Refill Last Dose  . albuterol (PROVENTIL HFA;VENTOLIN HFA) 108 (90 BASE) MCG/ACT inhaler Inhale 2 puffs into the lungs every 6 (six) hours as needed for wheezing or shortness of breath. 1 Inhaler 2 07/20/2015 at am  . allopurinol (ZYLOPRIM) 300 MG tablet Take 300 mg by mouth daily with lunch.   07/22/2015 at am  . amLODipine (NORVASC) 10 MG tablet Take 10 mg by mouth daily with supper.    07/22/2015 at am  . aspirin EC 81 MG tablet Take 81 mg by mouth daily with lunch.   07/22/2015 at am  . Brinzolamide-Brimonidine [facility]) 1-0.2 % SUSP Place 1 drop into both eyes 2 (two) times daily.    07/22/2015 at am  . budesonide-formoterol (SYMBICORT) 160-4.5 MCG/ACT inhaler Inhale 2 puffs into the lungs 2 (two) times daily.   07/22/2015 at am  . calcitRIOL (ROCALTROL) 0.25 MCG capsule Take 0.25 mcg by mouth every Monday, Wednesday, and Friday.   07/22/2015 at am  . cycloSPORINE (RESTASIS) 0.05 % ophthalmic emulsion Place 1 drop into both eyes 2 (two) times daily.   07/22/2015 at am  . finasteride (PROSCAR) 5 MG tablet Take 5 mg by mouth at bedtime.    07/21/2015 at pm  . gabapentin (NEURONTIN) 300 MG capsule Take 1 capsule (300 mg total) by mouth 3 (three) times daily. PATIENT NEEDS OFFICE VISIT FOR ADDITIONAL REFILLS (Patient taking differently: Take 300 mg by mouth 3 (three) times daily. ) 90 capsule 0 07/22/2015 at am  . insulin aspart protamine- aspart (NOVOLOG MIX 70/30) (70-30) 100  UNIT/ML injection Inject 75 Units into the skin 2 (two) times daily before a meal.    07/22/2015 at am  . ipratropium-albuterol (DUONEB) 0.5-2.5 (3) MG/3ML SOLN Take 3 mLs by nebulization every 6 (six) hours as needed (shortness of breath/wheezing). Reported on 03/27/2015   07/22/2015 at am  . Liraglutide (VICTOZA) 18 MG/3ML SOPN Inject 1.2 mg into the skin daily at 3 pm.    07/22/2015 at 1500  . losartan (COZAAR) 100 MG tablet Take 100 mg by mouth every morning.    07/22/2015 at am  . metoprolol (LOPRESSOR) 50 MG tablet Take 25 mg by mouth 2 (two) times daily.   07/22/2015 at am  . montelukast (SINGULAIR) 10 MG tablet Take 1 tablet (10 mg total) by mouth at bedtime. 90 tablet 3 07/22/2015 at pm  . Multiple Vitamins-Minerals (PRESERVISION AREDS 2 PO) Take 1 tablet by mouth 2 (two) times daily.  07/22/2015 at am  . pilocarpine (PILOCAR) 2 % ophthalmic solution Place 1 drop into both eyes 3 (three) times daily.   07/22/2015 at am  . potassium chloride SA (K-DUR,KLOR-CON) 20 MEQ tablet Take 20 mEq by mouth daily.   07/22/2015 at am  . predniSONE (DELTASONE) 1 MG tablet Take 2 mg by mouth daily with breakfast. IN CONJUNCTION WITH ONE 5 MG TABLET TO EQUAL A TOTAL OF 7 MILLIGRAMS   07/22/2015 at am  . predniSONE (DELTASONE) 5 MG tablet Take 5 mg by mouth daily with breakfast. IN CONJUNCTION WITH TWO 1 MG TABLETS TO EQUAL A TOTAL OF 7 MILLIGRAMS   07/22/2015 at am  . simvastatin (ZOCOR) 80 MG tablet Take 40 mg by mouth daily with supper.   07/21/2015 at pm  . sodium chloride 0.9 % nebulizer solution Take 3 mLs by nebulization every 6 (six) hours as needed for wheezing.   07/22/2015 at am  . Tamsulosin HCl (FLOMAX) 0.4 MG CAPS Take 0.4 mg by mouth at bedtime.    07/21/2015 at pm  . tiZANidine (ZANAFLEX) 4 MG capsule Take 4 mg by mouth 3 (three) times daily as needed for muscle spasms.   07/22/2015 at pm  . furosemide (LASIX) 20 MG tablet Take 1 tablet (20 mg total) by mouth 2 (two) times daily. , as needed. NO MORE REFILLS WITHOUT OFFICE  VISIT - 2ND NOTICE (Patient not taking: Reported on 04/25/2015) 30 tablet 0 Taking  . oxyCODONE (ROXICODONE) 15 MG immediate release tablet Take 15 mg by mouth every 4 (four) hours as needed.       HOSPITAL MEDICATIONS: I have reviewed the patient's current medications.  VITALS: Blood pressure 149/71, pulse 73, temperature 98.1 F (36.7 C), temperature source Oral, resp. rate 18, height 5\' 11"  (1.803 m), weight 105.824 kg (233 lb 4.8 oz), SpO2 98 %.  PHYSICAL EXAM:  General: Alert, oriented x3, no distress Head: no evidence of trauma, PERRL, EOMI, no exophtalmos or lid lag, no myxedema, no xanthelasma; normal ears, nose and oropharynx Neck: Normal jugular venous pulsations and no hepatojugular reflux; brisk carotid pulses without delay and no carotid bruits Chest: clear to auscultation, no signs of consolidation by percussion or palpation, normal fremitus, symmetrical and full respiratory excursions Cardiovascular: normal position and quality of the apical impulse, regular rhythm, normal first heart sound and normal second heart sound, no rubs or gallops, no murmur Abdomen: no tenderness or distention, no masses by palpation, no abnormal pulsatility or arterial bruits, normal bowel sounds, no hepatosplenomegaly Extremities: no clubbing, cyanosis;  no edema; 2+ radial, ulnar and brachial pulses bilaterally; 2+ right femoral, posterior tibial and dorsalis pedis pulses; 2+ left femoral, posterior tibial and dorsalis pedis pulses; no subclavian or femoral bruits Neurological: grossly nonfocal   LABS  CBC  Recent Labs  07/25/15 0005  WBC 12.7*  NEUTROABS 11.9*  HGB 13.7  HCT 41.6  MCV 92.9  PLT 123XX123   Basic Metabolic Panel  Recent Labs  07/25/15 0005  NA 137  K 4.7  CL 101  CO2 24  GLUCOSE 361*  BUN 26*  CREATININE 1.91*  CALCIUM 10.1   Liver Function Tests  Recent Labs  07/25/15 0005  AST 23  ALT 17  ALKPHOS 73  BILITOT 1.9*  PROT 6.9  ALBUMIN 3.4*   No results  for input(s): LIPASE, AMYLASE in the last 72 hours. Cardiac Enzymes  Recent Labs  07/27/15 0309 07/27/15 0650  TROPONINI 0.14* 0.14*     IMAGING: Dg Chest [facility]  1 View  07/25/2015  CLINICAL DATA:  Preop respiratory exam for lumbar spine disc herniation. Coronary artery disease, COPD, and chronic kidney disease. EXAM: PORTABLE CHEST 1 VIEW COMPARISON:  03/29/2014 FINDINGS: Mild cardiomegaly stable. No evidence pulmonary infiltrate or edema. No evidence pleural effusion. Tiny calcified granuloma in the right lung base remains stable. Prior CABG again noted. IMPRESSION: Stable mild cardiomegaly.  No active lung disease. Electronically Signed   By: Earle Gell M.D.   On: 07/25/2015 21:55   Mr Outside Films Spine  07/26/2015  This examination belongs to an outside facility and is stored here for comparison purposes only.  Contact the originating outside institution for any associated report or interpretation.   ECG: Normal sinus rhythm, nondiagnostic Q waves in lead III and F, but not in lead 2, delayed R-wave progression across anterior precordium and generalized low voltage  TELEMETRY: NSR  IMPRESSION/RECOMMENDATION: 1. Low to moderate risk for perioperative cardiovascular complications. His cardiac risk is slightly increased due to the presence of coronary artery disease and multiple comorbid conditions including age, diabetes mellitus and renal insufficiency. Conversely, he does not have any high-risk features based on his symptom review, ECG and echocardiogram. He reports a relatively recent nuclear perfusion study that was "normal".  I do not think it is necessary to perform additional cardiac testing before the planned surgery. It is important that his metoprolol be continued without interruption throughout the perioperative period. 2. Abnormal troponin is a marginal abnormality and without the typical "rise and fall" of a true coronary event. I don't think he has had a recent acute  coronary syndrome. While the abnormal troponin may have adverse prognostic applications in the long run, I don't think it will change our current management. 3. HTN the pressure is slightly increased but this can be educated to pain and anxiety. Continue his usual antihypertensive medications, with particular care to avoid discontinuation of his beta blocker 4. Acute on chronic renal insufficiency is likely due to relative hypovolemia after his fall and should be monitored carefully. Avoid diuretics and nonsteroidal anti-inflammatory drugs 5. Obstructive sleep apnea/sarcoidosis/COPD please refer to Dr. Golden Pop notes 6. Chronic steroid use/possible adrenal suppression Hypotension in the postoperative period could be due to relative adrenal insufficiency and he may require increased doses of hydrocortisone.  Time Spent Directly with Patient: 40 minutes  Sanda Klein, MD, [facility] [phone] office [phone] pager   07/27/2015, 3:43 PM

## 2015-07-27 NOTE — Consult Note (Signed)
Name: Jacob [name] MRN: [id number] DOB: [birth year]    ADMISSION DATE:  07/24/2015 CONSULTATION DATE:  5/11  REFERRING MD :  Dr. Christella Noa  CHIEF COMPLAINT:  fall  BRIEF PATIENT DESCRIPTION: 75 year old male with COPD, pulmonary sarcoid, and OSA admitted to [facility] after fall. Requires lumbar laminectomy and discectomy. Pulmonary consulted for pre-op assessment.   SIGNIFICANT EVENTS    STUDIES:  MRI L spine 5/9 > Multilevel degenerative changes of the spine with varying degrees of central canal and foraminal stenosis as described above. Most notably there is marked central canal stenosis L3/L4 secondary to disc bulge and superimposed disc extrusion. Additionally, moderate central canal stenosis L4/L5 with a right foraminal disc extrusion resulting in moderate right foraminal stenosis. L5/S1 small central disc extrusion with mild to moderate central canal stenosis.   BRIEF:  75 year old male with PMH as below, which patient reports includes COPD, Pulmonary sarcoidosis by biopsy years ago and he has never had a flare, and OSA for which he had been trying CPAP. At baseline (before starting to have back pain due to present complaint about 1.5 weeks ago) he was able to ambulate around house a grocery store without difficulty. He does not wear oxygen at home. He does take prednisone, but this is for polymyalgia rheumatica, and not any pulmonary issue. 05/2014 he was diagnosed with renal carcinoma, for which he underwent biopsy and ablation 07/23/2015. During this process, a physician apparently told him that because of his lung disease he would never come off of the ventilator.   Approximately 5/2 he began to experience lower extremity weakness which has caused him to fall a few times in that one week period. 5/10 he fell in between the bathroom and toilet, which took him an hour to get up from with his daughter's help. They presented to [facility] then to [facility] for MRI, which discovered several areas of spinal  stenosis, most notably L3-S1. Family requested that he be transferred to [facility] for any procedures. He was seen by neurosurgery who are recommending lumbar laminectomy and discetomy to decompress the spinal canal at L3/4. PCCM has been consulted for pre-op evaluation.   SUBJECTIVE:  07/27/15 - feels that without scheduled nebs he has more dyspnea. Deemed Moderate RISK for postop resp compications from spinal surgery. Has mild trop leak. EKG pending. Repeat labs 07/27/2015 pending. Arlyce Harman 5/12 - fe1 2L/62% Ratio 85 - restrictive pattern  VITAL SIGNS: Temp:  [97.3 F (36.3 C)-98.2 F (36.8 C)] 98.1 F (36.7 C) (05/13 1020) Pulse Rate:  [57-97] 80 (05/13 1020) Resp:  [18] 18 (05/13 1020) BP: (95-124)/(42-74) 116/58 mmHg (05/13 1020) SpO2:  [95 %-100 %] 96 % (05/13 1020)  PHYSICAL EXAMINATION: General:  Elderly male in NAD Neuro:  Oriented x3. Alert HEENT:  [place]/AT, PERRL, no JVD Cardiovascular:  RRR, no MRG Lungs:  Clear bilateral breath sounds Abdomen:  Soft, non-tender, non-distended Musculoskeletal:  No acute deformity Skin:  Grossly intact  PULMONARY No results for input(s): PHART, PCO2ART, PO2ART, HCO3, TCO2, O2SAT in the last 168 hours.  Invalid input(s): PCO2, PO2  CBC  Recent Labs Lab 07/25/15 0005  HGB 13.7  HCT 41.6  WBC 12.7*  PLT 177    COAGULATION  Recent Labs Lab 07/25/15 0005  INR 1.06    CARDIAC   Recent Labs Lab 07/27/15 0309 07/27/15 0650  TROPONINI 0.14* 0.14*   No results for input(s): PROBNP in the last 168 hours.   CHEMISTRY  Recent Labs Lab 07/25/15 0005  NA 137  K 4.7  CL 101  CO2 24  GLUCOSE 361*  BUN 26*  CREATININE 1.91*  CALCIUM 10.1   Estimated Creatinine Clearance: 41.4 mL/min (by C-G formula based on Cr of 1.91).   LIVER  Recent Labs Lab 07/25/15 0005  AST 23  ALT 17  ALKPHOS 73  BILITOT 1.9*  PROT 6.9  ALBUMIN 3.4*  INR 1.06     INFECTIOUS No results for input(s): LATICACIDVEN, PROCALCITON in the  last 168 hours.   ENDOCRINE CBG (last 3)   Recent Labs  07/26/15 2259 07/27/15 0614 07/27/15 1150  GLUCAP 157* 75 78         IMAGING x48h  - image(s) personally visualized  -   highlighted in bold Dg Chest Port 1 View  07/25/2015  CLINICAL DATA:  Preop respiratory exam for lumbar spine disc herniation. Coronary artery disease, COPD, and chronic kidney disease. EXAM: PORTABLE CHEST 1 VIEW COMPARISON:  03/29/2014 FINDINGS: Mild cardiomegaly stable. No evidence pulmonary infiltrate or edema. No evidence pleural effusion. Tiny calcified granuloma in the right lung base remains stable. Prior CABG again noted. IMPRESSION: Stable mild cardiomegaly.  No active lung disease. Electronically Signed   By: Earle Gell M.D.   On: 07/25/2015 21:55   Mr Outside Films Spine  07/26/2015  This examination belongs to an outside facility and is stored here for comparison purposes only.  Contact the originating outside institution for any associated report or interpretation.       ASSESSMENT / PLAN: Severe lumbar stenosis  - per neurosurgery  Preoperative resp exam  - moderate rrisk for complications - main would be HCAP, Pulmnary edema and DVT/PE - optimize sleep apnea and "copd" mgmt with nebs  Restrictive v Obsst Lung patter  - chagne to nebs - continue singulair - get HRCT chst  Troponin leak  - get EKG  - Dr Caryl Comes cards called for peropartive cardiac assessment - await echo results   Renal insuff  - rechec bmet 07/27/2015    PCCM will round from remote 07/28/15   Dr. Brand Males, M.D., F.C.C.P Pulmonary and Critical Care Medicine Staff Physician [place] [facility] Pager: [phone], If no answer or between  15:00h - 7:00h: call [phone]  07/27/2015 12:36 PM

## 2015-07-27 NOTE — Progress Notes (Signed)
Patient ID: Jacob [name], male   DOB: 07/21/1940, 75 y.o.   MRN: [name] Doing well condition of back and left leg pain  Neurologic stable baseline weaknes 5/5 left iliopsoas and quad  Plan OR per Dr. Cyndy Freeze Monday

## 2015-07-27 NOTE — Progress Notes (Signed)
Remote access chart review     Recent Labs Lab 07/27/15 0309  TROPONINI 0.14*    Trop high  Plan  - await echo report  - repeat troponins 07/27/2015 - get 12 lead -   - depending on this cards consult pre-op might be needed  Dr. Brand Males, M.D., [facility].C.P Pulmonary and Critical Care Medicine Staff Physician [place] [facility] Pager: [phone], If no answer or between  15:00h - 7:00h: call [phone]  07/27/2015 6:43 AM

## 2015-07-27 NOTE — Progress Notes (Addendum)
Patient called RN to to the room, he reported having panic attacks, he states that he has panic attacks frequently, the last time was when he's wife fell and and when the scan showed that she broke her neck. He said that  he has been to mental health counseling at the [place] [facility]-states that he thinks the counseling was "stupid", he states that the people in counseling were "crazy kids with PTSD".  Patient is very tearful this moment, he states that he is worried about the surgery but knows that "I have no choice, I cant live like this without the surgery".   He requested cold towels, cold towels given to patient; applied and re-applied to neck, head and face. RN sits with patient and encouraged him to share what he was thinking as he continued to weep. Patient states that he worried because everything might go wrong with his surgery. He is encouraged to think positive. When sked if he had any questions for the doctor about the surgery, he said "No, I just need to turn my mind around". Family members walks in the room this moment-Will continue to monitor.   **Patient reported that Xanax worked for him in the past** *MD notified, PRN xanax ordered*

## 2015-07-27 NOTE — Progress Notes (Signed)
Patient stated he doesn't want to wear the hospital provided CPAP .  Patient stated he would have his family bring in the home CPAP and mask.  RT advised patient that pulmonary doctor wants him to wear the CPAP.  Pt staed he would have to bring it in tomorrow and if he changed his mind throughout the night he would advise the RN and Respiratory would come and apply hospital provided CPAP.

## 2015-07-28 LAB — GLUCOSE, CAPILLARY
Glucose-Capillary: 54 mg/dL — ABNORMAL LOW (ref 65–99)
Glucose-Capillary: 141 mg/dL — ABNORMAL HIGH (ref 65–99)
Glucose-Capillary: 46 mg/dL — ABNORMAL LOW (ref 65–99)
Glucose-Capillary: 211 mg/dL — ABNORMAL HIGH (ref 65–99)
Glucose-Capillary: 193 mg/dL — ABNORMAL HIGH (ref 65–99)
Glucose-Capillary: 177 mg/dL — ABNORMAL HIGH (ref 65–99)

## 2015-07-28 LAB — CBC WITH DIFFERENTIAL/PLATELET
Basophils Absolute: 0 10*3/uL (ref 0.0–0.1)
Basophils Relative: 0 %
Eosinophils Absolute: 0.2 10*3/uL (ref 0.0–0.7)
Eosinophils Relative: 2 %
HCT: 37.7 % — ABNORMAL LOW (ref 39.0–52.0)
Hemoglobin: 12.4 g/dL — ABNORMAL LOW (ref 13.0–17.0)
Lymphocytes Relative: 17 %
Lymphs Abs: 1.8 10*3/uL (ref 0.7–4.0)
MCH: 31.5 pg (ref 26.0–34.0)
MCHC: 32.9 g/dL (ref 30.0–36.0)
MCV: 95.7 fL (ref 78.0–100.0)
Monocytes Absolute: 1 10*3/uL (ref 0.1–1.0)
Monocytes Relative: 10 %
Neutro Abs: 7.1 10*3/uL (ref 1.7–7.7)
Neutrophils Relative %: 71 %
Platelets: 206 10*3/uL (ref 150–400)
RBC: 3.94 MIL/uL — ABNORMAL LOW (ref 4.22–5.81)
RDW: 15.5 % (ref 11.5–15.5)
WBC: 10 10*3/uL (ref 4.0–10.5)

## 2015-07-28 LAB — BASIC METABOLIC PANEL
Anion gap: 9 (ref 5–15)
BUN: 33 mg/dL — ABNORMAL HIGH (ref 6–20)
CO2: 27 mmol/L (ref 22–32)
Calcium: 9.2 mg/dL (ref 8.9–10.3)
Chloride: 110 mmol/L (ref 101–111)
Creatinine, Ser: 1.54 mg/dL — ABNORMAL HIGH (ref 0.61–1.24)
GFR calc Af Amer: 49 mL/min — ABNORMAL LOW (ref >60)
GFR calc non Af Amer: 42 mL/min — ABNORMAL LOW (ref >60)
Glucose, Bld: 55 mg/dL — ABNORMAL LOW (ref 65–99)
Potassium: 4 mmol/L (ref 3.5–5.1)
Sodium: 146 mmol/L — ABNORMAL HIGH (ref 135–145)

## 2015-07-28 LAB — PHOSPHORUS: Phosphorus: 3.1 mg/dL (ref 2.5–4.6)

## 2015-07-28 LAB — MAGNESIUM: Magnesium: 2.3 mg/dL (ref 1.7–2.4)

## 2015-07-28 MED ORDER — FLEET ENEMA 7-19 GM/118ML RE ENEM
1.0000 | ENEMA | Freq: Once | RECTAL | Status: AC
Start: 2015-07-28 — End: 2015-08-05
  Administered 2015-08-05: 1 via RECTAL
  Filled 2015-07-28: qty 1

## 2015-07-28 MED ORDER — DEXTROSE 50 % IV SOLN
INTRAVENOUS | Status: AC
  Filled 2015-07-28: qty 50

## 2015-07-28 MED ORDER — DEXTROSE 50 % IV SOLN
25.0000 mL | Freq: Once | INTRAVENOUS | Status: AC
  Administered 2015-07-28: 25 mL via INTRAVENOUS

## 2015-07-28 NOTE — Progress Notes (Signed)
Patient's daughter brought patient's home CPAP machine in.  No tears or any damage noted.  Gave patient bottle of sterile H20 for when he was ready to use his CPAP.  RN aware.

## 2015-07-28 NOTE — Progress Notes (Signed)
Patient has home CPAP and states he doesn't need any assistance applying.  RT setup home CPAP and mask for patient and added sterile  water to chamber.  RT will continue to monitor

## 2015-07-28 NOTE — Progress Notes (Signed)
No acute events Constipation AVSS Awake and alert Neuro stable Escalate bowel stim OR tomorrow

## 2015-07-28 NOTE — Progress Notes (Signed)
[id number] am:   CBG 54, asymptomatic at time, graham crackers and 4 ounces of orange juice administered. [id number] am:   CBG  46, diaphoretic and anxious, 25 ml D50 administered. Request for pain medication deferred at present time due to hypoglycemia  0728 am    CBG 141

## 2015-07-29 ENCOUNTER — Other Ambulatory Visit: Payer: Self-pay | Admitting: Neurosurgery

## 2015-07-29 LAB — GLUCOSE, CAPILLARY
Glucose-Capillary: 39 mg/dL — CL (ref 65–99)
Glucose-Capillary: 48 mg/dL — ABNORMAL LOW (ref 65–99)
Glucose-Capillary: 75 mg/dL (ref 65–99)
Glucose-Capillary: 94 mg/dL (ref 65–99)
Glucose-Capillary: 167 mg/dL — ABNORMAL HIGH (ref 65–99)
Glucose-Capillary: 221 mg/dL — ABNORMAL HIGH (ref 65–99)

## 2015-07-29 LAB — CBC WITH DIFFERENTIAL/PLATELET
Basophils Absolute: 0 10*3/uL (ref 0.0–0.1)
Basophils Relative: 0 %
Eosinophils Absolute: 0.3 10*3/uL (ref 0.0–0.7)
Eosinophils Relative: 3 %
HCT: 39.4 % (ref 39.0–52.0)
Hemoglobin: 12.6 g/dL — ABNORMAL LOW (ref 13.0–17.0)
Lymphocytes Relative: 13 %
Lymphs Abs: 1.3 10*3/uL (ref 0.7–4.0)
MCH: 30.5 pg (ref 26.0–34.0)
MCHC: 32 g/dL (ref 30.0–36.0)
MCV: 95.4 fL (ref 78.0–100.0)
Monocytes Absolute: 1.1 10*3/uL — ABNORMAL HIGH (ref 0.1–1.0)
Monocytes Relative: 12 %
Neutro Abs: 6.8 10*3/uL (ref 1.7–7.7)
Neutrophils Relative %: 72 %
Platelets: 185 10*3/uL (ref 150–400)
RBC: 4.13 MIL/uL — ABNORMAL LOW (ref 4.22–5.81)
RDW: 15.8 % — ABNORMAL HIGH (ref 11.5–15.5)
WBC: 9.4 10*3/uL (ref 4.0–10.5)

## 2015-07-29 LAB — BASIC METABOLIC PANEL
Anion gap: 8 (ref 5–15)
BUN: 25 mg/dL — ABNORMAL HIGH (ref 6–20)
CO2: 26 mmol/L (ref 22–32)
Calcium: 9 mg/dL (ref 8.9–10.3)
Chloride: 107 mmol/L (ref 101–111)
Creatinine, Ser: 1.49 mg/dL — ABNORMAL HIGH (ref 0.61–1.24)
GFR calc Af Amer: 51 mL/min — ABNORMAL LOW (ref >60)
GFR calc non Af Amer: 44 mL/min — ABNORMAL LOW (ref >60)
Glucose, Bld: 52 mg/dL — ABNORMAL LOW (ref 65–99)
Potassium: 4.1 mmol/L (ref 3.5–5.1)
Sodium: 141 mmol/L (ref 135–145)

## 2015-07-29 LAB — PHOSPHORUS: Phosphorus: 3 mg/dL (ref 2.5–4.6)

## 2015-07-29 LAB — MAGNESIUM: Magnesium: 2.2 mg/dL (ref 1.7–2.4)

## 2015-07-29 MED ORDER — IPRATROPIUM-ALBUTEROL 0.5-2.5 (3) MG/3ML IN SOLN
3.0000 mL | Freq: Three times a day (TID) | RESPIRATORY_TRACT | Status: DC
  Administered 2015-07-30: 3 mL via RESPIRATORY_TRACT
  Filled 2015-07-29: qty 3

## 2015-07-29 MED ORDER — DEXTROSE 50 % IV SOLN: 25.0000 mL | Freq: Once | INTRAVENOUS | Status: DC

## 2015-07-29 MED ORDER — IPRATROPIUM-ALBUTEROL 0.5-2.5 (3) MG/3ML IN SOLN
3.0000 mL | Freq: Four times a day (QID) | RESPIRATORY_TRACT | Status: DC | PRN
  Administered 2015-08-03 – 2015-08-08 (×2): 3 mL via RESPIRATORY_TRACT
  Filled 2015-07-29 (×3): qty 3

## 2015-07-29 MED ORDER — DEXTROSE 50 % IV SOLN
INTRAVENOUS | Status: AC
  Administered 2015-07-29: 25 mL
  Filled 2015-07-29: qty 50

## 2015-07-29 NOTE — Progress Notes (Signed)
Name: Jacob [name] MRN: [id number] DOB: [birth year]    ADMISSION DATE:  07/24/2015 CONSULTATION DATE:  5/11  REFERRING MD :  Dr. Christella Noa  CHIEF COMPLAINT:  fall  BRIEF PATIENT DESCRIPTION: 75 year old male with COPD, pulmonary sarcoid, and OSA admitted to [facility] after fall. Requires lumbar laminectomy and discectomy. Pulmonary consulted for pre-op assessment.   SIGNIFICANT EVENTS    STUDIES:  MRI L spine 5/9 > Multilevel degenerative changes of the spine with varying degrees of central canal and foraminal stenosis as described above. Most notably there is marked central canal stenosis L3/L4 secondary to disc bulge and superimposed disc extrusion. Additionally, moderate central canal stenosis L4/L5 with a right foraminal disc extrusion resulting in moderate right foraminal stenosis  SUBJECTIVE:  Awaiting surgery   VITAL SIGNS: Temp:  [97.9 F (36.6 C)-98.4 F (36.9 C)] 97.9 F (36.6 C) (05/15 0435) Pulse Rate:  [78-102] 88 (05/15 0435) Resp:  [16-20] 20 (05/15 0435) BP: (130-170)/(61-85) 144/61 mmHg (05/15 0435) SpO2:  [92 %-98 %] 92 % (05/15 0720)  PHYSICAL EXAMINATION: General:  Elderly male in NAD Neuro:  Oriented x3. Alert HEENT:  [place]/AT, PERRL, no JVD Cardiovascular:  RRR, no MRG Lungs:  Clear bilateral breath sounds Abdomen:  Soft, non-tender, non-distended Musculoskeletal:  No acute deformity Skin:  Grossly intact  PULMONARY No results for input(s): PHART, PCO2ART, PO2ART, HCO3, TCO2, O2SAT in the last 168 hours.  Invalid input(s): PCO2, PO2  CBC  Recent Labs Lab 07/27/15 1812 07/28/15 0405 07/29/15 0317  HGB 12.2* 12.4* 12.6*  HCT 38.5* 37.7* 39.4  WBC 9.3 10.0 9.4  PLT 204 206 185    COAGULATION  Recent Labs Lab 07/25/15 0005  INR 1.06    CARDIAC    Recent Labs Lab 07/27/15 0309 07/27/15 0650  TROPONINI 0.14* 0.14*   No results for input(s): PROBNP in the last 168 hours.   CHEMISTRY  Recent Labs Lab 07/25/15 0005  07/27/15 1812 07/28/15 0405 07/29/15 0317  NA 137 137 146* 141  K 4.7 4.3 4.0 4.1  CL 101 105 110 107  CO2 24 24 27 26   GLUCOSE 361* 148* 55* 52*  BUN 26* 37* 33* 25*  CREATININE 1.91* 1.71* 1.54* 1.49*  CALCIUM 10.1 9.0 9.2 9.0  MG  --  1.9 2.3 2.2  PHOS  --  3.2 3.1 3.0   Estimated Creatinine Clearance: 53 mL/min (by C-G formula based on Cr of 1.49).   LIVER  Recent Labs Lab 07/25/15 0005  AST 23  ALT 17  ALKPHOS 73  BILITOT 1.9*  PROT 6.9  ALBUMIN 3.4*  INR 1.06     INFECTIOUS No results for input(s): LATICACIDVEN, PROCALCITON in the last 168 hours.   ENDOCRINE CBG (last 3)   Recent Labs  07/29/15 0718 07/29/15 0759 07/29/15 1136  GLUCAP 48* 75 94         IMAGING x48h  - image(s) personally visualized  -   highlighted in bold Ct Chest High Resolution  07/28/2015  CLINICAL DATA:  75 year old male with history of sarcoidosis and COPD. History of asthma. EXAM: CT CHEST WITHOUT CONTRAST TECHNIQUE: Multidetector CT imaging of the chest was performed following the standard protocol without intravenous contrast. High resolution imaging of the lungs, as well as inspiratory and expiratory imaging, was performed. COMPARISON:  Chest CT 06/20/2010. FINDINGS: Mediastinum/Lymph Nodes: Heart size is mildly enlarged. There is no significant pericardial fluid, thickening or pericardial calcification. There is atherosclerosis of the thoracic aorta, the great vessels of the mediastinum  and the coronary arteries, including calcified atherosclerotic plaque in the left main, left anterior descending, left circumflex and right coronary arteries. Status post median sternotomy for CABG, including LIMA to the LAD. No pathologically enlarged mediastinal or hilar lymph nodes. Please note that accurate exclusion of hilar adenopathy is limited on noncontrast CT scans. Esophagus is unremarkable in appearance. No axillary lymphadenopathy. Lungs/Pleura: Large calcified granuloma with  surrounding tiny calcified granulomas in the base of the right lower lobe. High-resolution images demonstrate linear areas of architectural distortion in the dependent portions of the lower lobes of the lungs bilaterally which are new compared to the prior study. This is associated with some very mild cylindrical bronchiectasis in the basal segments of the lower lobes dependently. There are no other more widespread areas of ground-glass attenuation, subpleural reticulation, traction bronchiectasis or frank honeycombing. Inspiratory and expiratory imaging demonstrates mild air trapping, indicative of small airways disease. No acute consolidative airspace disease. No pleural effusions. No suspicious appearing pulmonary nodules or masses. Upper abdomen: A tiny calcified granuloma in the right lobe of the liver. Atherosclerosis. Musculoskeletal: There are no aggressive appearing lytic or blastic lesions noted in the visualized portions of the skeleton. IMPRESSION: 1. Interval development of what appear to be areas of post infectious or inflammatory scarring in the lower lobes of the lungs bilaterally. This is associated with some very mild cylindrical bronchiectasis in the basal segments of the lower lobes of the lungs as well. 2. The above imaging findings are not typical imaging manifestations of sarcoidosis. Additionally, there is no lymphadenopathy or other typical manifestations of sarcoidosis in the thorax. 3. No evidence to suggest other interstitial lung disease. 4. Mild air trapping, indicative of mild small airways disease. 5. Small calcified granulomas in the right lower lobe and right lobe of the liver again noted. 6. Atherosclerosis, including left main and 3 vessel coronary artery disease. Status post median sternotomy for CABG, including LIMA to the LAD. Electronically Signed   By: Vinnie Langton M.D.   On: 07/28/2015 07:14        ASSESSMENT / PLAN: Severe lumbar stenosis  - per  neurosurgery  Restrictive Lung disease  H/o pulmonary Sarcoid OSA ->moderate risk for complications - main would be HCAP, Pulmnary edema and DVT/PE -> CT findings showing new cylindrical BTX in dependent bases w/ calcified granuloma in right LL.  Plan - cont nebs - continue Singulair - CPAP at HS  Troponin leak  -post op tele and BB  Renal insuff-->improved Plan Renal dose meds    Erick Colace ACNP-BC [place] Pager # [phone] OR # [phone] if no answer   07/29/2015 11:50 AM

## 2015-07-29 NOTE — Care Management Note (Signed)
Case Management Note  Patient Details  Name: Jacob [name] MRN: [name] Date of Birth: March 13, 1941  Subjective/Objective:                    Action/Plan: Plan is for patient to go to OR today. CM following for discharge needs.   Expected Discharge Date:                  Expected Discharge Plan:     In-House Referral:     Discharge planning Services     Post Acute Care Choice:    Choice offered to:     DME Arranged:    DME Agency:     HH Arranged:    HH Agency:     Status of Service:  In process, will continue to follow  Medicare Important Message Given:    Date Medicare IM Given:    Medicare IM give by:    Date Additional Medicare IM Given:    Additional Medicare Important Message give by:     If discussed at [place] of Stay Meetings, dates discussed:    Additional Comments:  Pollie Friar, RN 07/29/2015, 11:14 AM

## 2015-07-29 NOTE — Progress Notes (Addendum)
Inpatient Diabetes Program Recommendations  AACE/ADA: New Consensus Statement on Inpatient Glycemic Control (2015)  Target Ranges:  Prepandial:   less than 140 mg/dL      Peak postprandial:   less than 180 mg/dL (1-2 hours)      Critically ill patients:  140 - 180 mg/dL   Review of Glycemic ControlResults for KEIVEN, ACHUFF (MRN [name]) as of 07/29/2015 14:59  Ref. Range 07/28/2015 06:43 07/28/2015 07:28 07/28/2015 11:58 07/28/2015 16:42 07/28/2015 21:46 07/29/2015 06:17 07/29/2015 07:18 07/29/2015 07:59 07/29/2015 11:36  Glucose-Capillary Latest Ref Range: 65-99 mg/dL 46 (L) 141 (H) 211 (H) 193 (H) 177 (H) 39 (LL) 48 (L) 75 94   Diabetes history: Type 2 diabetes Outpatient Diabetes medications: Novolog 70/30 75 units bid Current orders for Inpatient glycemic control:  Novolog 70/30 80 units bid, Novolog resistant tid with meals  Inpatient Diabetes Program Recommendations:    Spoke with RN and she states that patient's wife reports patient has been having increased hypoglycemia events at home prior to admit.  He is NPO for surgery today. Blood sugar was low this morning.  Consider d/c of Novolog 70/30 while patient is in the hospital to prevent hypoglycemia.  Consider weight based insulin regimen instead. May consider Lantus 30 units (0.3 units/kg) daily and Novolog meal coverage 6 units tid with meals (hold if patient eats less than 50%).   Thanks, Adah Perl, RN, BC-ADM Inpatient Diabetes Coordinator Pager [phone] (8a-5p)

## 2015-07-29 NOTE — Progress Notes (Addendum)
Hypoglycemic Event  CBG: 39  Treatment: D50 IV 25 mL  Symptoms: Hungry  Follow-up CBG: Time: 0705 CBG Result: 48  Possible Reasons for Event: Inadequate meal intake  Comments/MD notified: N/A    Jacob [name]    Repeat hypoglycemic protocol in 15 mins

## 2015-07-29 NOTE — Care Management Note (Signed)
Case Management Note  Patient Details  Name: RAHKIM RABALAIS MRN: [id number] Date of Birth: [birth year]  Subjective/Objective:                    Action/Plan: CM met with patient and his family. Patient states his hospital stay should be under his [employer] insurance. CM made copies of his card and faxed them to financial counseling. Will continue to follow.   Expected Discharge Date:                  Expected Discharge Plan:     In-House Referral:     Discharge planning Services     Post Acute Care Choice:    Choice offered to:     DME Arranged:    DME Agency:     HH Arranged:    HH Agency:     Status of Service:  In process, will continue to follow  Medicare Important Message Given:    Date Medicare IM Given:    Medicare IM give by:    Date Additional Medicare IM Given:    Additional Medicare Important Message give by:     If discussed at [place] of Stay Meetings, dates discussed:    Additional Comments:  Pollie Friar, RN 07/29/2015, 11:15 AM

## 2015-07-29 NOTE — Progress Notes (Signed)
Hypoglycemic Event  CBG: 75   Treatment: D50 IV 25 mL  Symptoms: None  Follow-up CBG: Time:n/a CBG Result:n/a  Possible Reasons for Event: Inadequate meal intake  Comments/MD notified:none    Jacob [name]

## 2015-07-29 NOTE — Progress Notes (Signed)
Subjective:  Well known to me.  No c/o SOB or chest pain.  For surgery later today.  Cufrrently up on bedside commode and shaking.   Objective:  Vital Signs in the last 24 hours: BP 144/61 mmHg  Pulse 88  Temp(Src) 97.9 F (36.6 C) (Oral)  Resp 20  Ht 5\' 11"  (1.803 m)  Wt 105.824 kg (233 lb 4.8 oz)  BMI 32.55 kg/m2  SpO2 92%  Physical Exam: Tremulous appearing WM in NAD Lungs:  Clear Cardiac:  Regular rhythm, normal S1 and S2, no S3 Abdomen:  Soft, nontender, no masses Extremities:  No edema present  Intake/Output from previous day: 05/14 0701 - 05/15 0700 In: -  Out: 2700 [Urine:2700]  Weight Filed Weights   07/24/15 2217  Weight: 105.824 kg (233 lb 4.8 oz)    Lab Results: Basic Metabolic Panel:  Recent Labs  07/28/15 0405 07/29/15 0317  NA 146* 141  K 4.0 4.1  CL 110 107  CO2 27 26  GLUCOSE 55* 52*  BUN 33* 25*  CREATININE 1.54* 1.49*   CBC:  Recent Labs  07/28/15 0405 07/29/15 0317  WBC 10.0 9.4  NEUTROABS 7.1 6.8  HGB 12.4* 12.6*  HCT 37.7* 39.4  MCV 95.7 95.4  PLT 206 185    Recent Labs  07/27/15 0309 07/27/15 0650  TROPONINI 0.14* 0.14*   Assessment/Plan:  Should be OK for surgery later today.  Keep on telemetry post op and continue beta blocker.      Kerry Hough  MD [facility]  07/29/2015, 9:17 AM

## 2015-07-29 NOTE — Progress Notes (Signed)
Patient ID: Jacob [name], male   DOB: [birth year], 75 y.o.   MRN: [id number] BP 139/83 mmHg  Pulse 105  Temp(Src) 98.6 F (37 C) (Oral)  Resp 21  Ht 5\' 11"  (1.803 m)  Wt 105.824 kg (233 lb 4.8 oz)  BMI 32.55 kg/m2  SpO2 94% Alert and oriented x 4 Speech clear and fluent Due to emergency case we will have to postpone the case until tomorrow.

## 2015-07-30 ENCOUNTER — Inpatient Hospital Stay (HOSPITAL_COMMUNITY): Payer: PPO | Admitting: Certified Registered"

## 2015-07-30 ENCOUNTER — Inpatient Hospital Stay (HOSPITAL_COMMUNITY): Payer: PPO

## 2015-07-30 ENCOUNTER — Encounter (HOSPITAL_COMMUNITY)
Admission: AD | Disposition: A | Discharge status: Skilled Nursing Facility | Payer: Self-pay | Source: Other Acute Inpatient Hospital | Attending: Neurosurgery

## 2015-07-30 ENCOUNTER — Encounter (HOSPITAL_COMMUNITY): Payer: Self-pay | Admitting: Certified Registered Nurse Anesthetist

## 2015-07-30 HISTORY — PX: LUMBAR LAMINECTOMY/DECOMPRESSION MICRODISCECTOMY: SHX5026

## 2015-07-30 LAB — GLUCOSE, CAPILLARY
Glucose-Capillary: 288 mg/dL — ABNORMAL HIGH (ref 65–99)
Glucose-Capillary: 319 mg/dL — ABNORMAL HIGH (ref 65–99)
Glucose-Capillary: 327 mg/dL — ABNORMAL HIGH (ref 65–99)
Glucose-Capillary: 216 mg/dL — ABNORMAL HIGH (ref 65–99)

## 2015-07-30 LAB — BASIC METABOLIC PANEL
Anion gap: 12 (ref 5–15)
BUN: 26 mg/dL — ABNORMAL HIGH (ref 6–20)
CO2: 26 mmol/L (ref 22–32)
Calcium: 9 mg/dL (ref 8.9–10.3)
Chloride: 100 mmol/L — ABNORMAL LOW (ref 101–111)
Creatinine, Ser: 1.63 mg/dL — ABNORMAL HIGH (ref 0.61–1.24)
GFR calc Af Amer: 46 mL/min — ABNORMAL LOW (ref >60)
GFR calc non Af Amer: 40 mL/min — ABNORMAL LOW (ref >60)
Glucose, Bld: 310 mg/dL — ABNORMAL HIGH (ref 65–99)
Potassium: 4.5 mmol/L (ref 3.5–5.1)
Sodium: 138 mmol/L (ref 135–145)

## 2015-07-30 LAB — CBC WITH DIFFERENTIAL/PLATELET
Basophils Absolute: 0 10*3/uL (ref 0.0–0.1)
Basophils Relative: 0 %
Eosinophils Absolute: 0.3 10*3/uL (ref 0.0–0.7)
Eosinophils Relative: 3 %
HCT: 39.6 % (ref 39.0–52.0)
Hemoglobin: 12.7 g/dL — ABNORMAL LOW (ref 13.0–17.0)
Lymphocytes Relative: 10 %
Lymphs Abs: 0.9 10*3/uL (ref 0.7–4.0)
MCH: 30.7 pg (ref 26.0–34.0)
MCHC: 32.1 g/dL (ref 30.0–36.0)
MCV: 95.7 fL (ref 78.0–100.0)
Monocytes Absolute: 0.9 10*3/uL (ref 0.1–1.0)
Monocytes Relative: 10 %
Neutro Abs: 7.7 10*3/uL (ref 1.7–7.7)
Neutrophils Relative %: 77 %
Platelets: 190 10*3/uL (ref 150–400)
RBC: 4.14 MIL/uL — ABNORMAL LOW (ref 4.22–5.81)
RDW: 15.9 % — ABNORMAL HIGH (ref 11.5–15.5)
WBC: 9.9 10*3/uL (ref 4.0–10.5)

## 2015-07-30 LAB — PHOSPHORUS: Phosphorus: 3.1 mg/dL (ref 2.5–4.6)

## 2015-07-30 LAB — MAGNESIUM: Magnesium: 2 mg/dL (ref 1.7–2.4)

## 2015-07-30 SURGERY — LUMBAR LAMINECTOMY/DECOMPRESSION MICRODISCECTOMY 1 LEVEL
Anesthesia: General | Site: Back

## 2015-07-30 MED ORDER — PHENYLEPHRINE HCL 10 MG/ML IJ SOLN
INTRAMUSCULAR | Status: DC | PRN
  Administered 2015-07-30: 80 ug via INTRAVENOUS
  Administered 2015-07-30: 120 ug via INTRAVENOUS
  Administered 2015-07-30 (×3): 80 ug via INTRAVENOUS

## 2015-07-30 MED ORDER — BUPIVACAINE HCL (PF) 0.5 % IJ SOLN
INTRAMUSCULAR | Status: DC | PRN
  Administered 2015-07-30: 30 mL

## 2015-07-30 MED ORDER — SUGAMMADEX SODIUM 200 MG/2ML IV SOLN
INTRAVENOUS | Status: DC | PRN
  Administered 2015-07-30: 200 mg via INTRAVENOUS

## 2015-07-30 MED ORDER — SODIUM CHLORIDE 0.9% FLUSH: 3.0000 mL | INTRAVENOUS | Status: DC | PRN

## 2015-07-30 MED ORDER — FENTANYL CITRATE (PF) 250 MCG/5ML IJ SOLN
INTRAMUSCULAR | Status: AC
  Filled 2015-07-30: qty 5

## 2015-07-30 MED ORDER — EPHEDRINE SULFATE 50 MG/ML IJ SOLN
INTRAMUSCULAR | Status: DC | PRN
  Administered 2015-07-30: 10 mg via INTRAVENOUS

## 2015-07-30 MED ORDER — ONDANSETRON HCL 4 MG/2ML IJ SOLN
INTRAMUSCULAR | Status: AC
  Filled 2015-07-30: qty 2

## 2015-07-30 MED ORDER — PROPOFOL 10 MG/ML IV BOLUS
INTRAVENOUS | Status: AC
  Filled 2015-07-30: qty 20

## 2015-07-30 MED ORDER — ONDANSETRON HCL 4 MG/2ML IJ SOLN
INTRAMUSCULAR | Status: DC | PRN
  Administered 2015-07-30: 4 mg via INTRAVENOUS

## 2015-07-30 MED ORDER — OXYCODONE HCL 5 MG/5ML PO SOLN: 5.0000 mg | Freq: Once | ORAL | Status: DC | PRN

## 2015-07-30 MED ORDER — SUCCINYLCHOLINE CHLORIDE 20 MG/ML IJ SOLN
INTRAMUSCULAR | Status: DC | PRN
  Administered 2015-07-30: 100 mg via INTRAVENOUS

## 2015-07-30 MED ORDER — INSULIN ASPART 100 UNIT/ML ~~LOC~~ SOLN
SUBCUTANEOUS | Status: AC
  Filled 2015-07-30: qty 1

## 2015-07-30 MED ORDER — PHENYLEPHRINE HCL 10 MG/ML IJ SOLN
10.0000 mg | INTRAVENOUS | Status: DC | PRN
  Administered 2015-07-30: 20 ug/min via INTRAVENOUS

## 2015-07-30 MED ORDER — 0.9 % SODIUM CHLORIDE (POUR BTL) OPTIME
TOPICAL | Status: DC | PRN
  Administered 2015-07-30: 1000 mL

## 2015-07-30 MED ORDER — FENTANYL CITRATE (PF) 100 MCG/2ML IJ SOLN
INTRAMUSCULAR | Status: DC | PRN
  Administered 2015-07-30 (×2): 50 ug via INTRAVENOUS

## 2015-07-30 MED ORDER — OXYCODONE HCL 5 MG PO TABS: 5.0000 mg | ORAL_TABLET | Freq: Once | ORAL | Status: DC | PRN

## 2015-07-30 MED ORDER — OXYCODONE-ACETAMINOPHEN 5-325 MG PO TABS
1.0000 | ORAL_TABLET | ORAL | Status: DC | PRN
  Administered 2015-07-31: 1 via ORAL
  Administered 2015-07-31: 2 via ORAL
  Administered 2015-07-31 – 2015-08-01 (×3): 1 via ORAL
  Filled 2015-07-30 (×4): qty 1
  Filled 2015-07-30: qty 2

## 2015-07-30 MED ORDER — LIDOCAINE HCL (CARDIAC) 20 MG/ML IV SOLN
INTRAVENOUS | Status: DC | PRN
  Administered 2015-07-30: 60 mg via INTRAVENOUS

## 2015-07-30 MED ORDER — PHENOL 1.4 % MT LIQD: 1.0000 | OROMUCOSAL | Status: DC | PRN

## 2015-07-30 MED ORDER — PHENYLEPHRINE 40 MCG/ML (10ML) SYRINGE FOR IV PUSH (FOR BLOOD PRESSURE SUPPORT)
PREFILLED_SYRINGE | INTRAVENOUS | Status: AC
  Filled 2015-07-30: qty 10

## 2015-07-30 MED ORDER — HYDROMORPHONE HCL 1 MG/ML IJ SOLN: 0.2500 mg | INTRAMUSCULAR | Status: DC | PRN

## 2015-07-30 MED ORDER — CEFAZOLIN SODIUM 1 G IJ SOLR
INTRAMUSCULAR | Status: DC | PRN
  Administered 2015-07-30: 2 g via INTRAMUSCULAR

## 2015-07-30 MED ORDER — DEXAMETHASONE SODIUM PHOSPHATE 4 MG/ML IJ SOLN
4.0000 mg | Freq: Four times a day (QID) | INTRAMUSCULAR | Status: AC
Start: 2015-07-31 — End: 2015-07-31
  Administered 2015-07-30: 4 mg via INTRAVENOUS
  Filled 2015-07-30: qty 1

## 2015-07-30 MED ORDER — SODIUM CHLORIDE 0.9 % IV SOLN: 250.0000 mL | INTRAVENOUS | Status: DC

## 2015-07-30 MED ORDER — ROCURONIUM BROMIDE 50 MG/5ML IV SOLN
INTRAVENOUS | Status: AC
  Filled 2015-07-30: qty 1

## 2015-07-30 MED ORDER — THROMBIN 5000 UNITS EX SOLR
CUTANEOUS | Status: DC | PRN
  Administered 2015-07-30 (×2): 5000 [IU] via TOPICAL

## 2015-07-30 MED ORDER — MENTHOL 3 MG MT LOZG: 1.0000 | LOZENGE | OROMUCOSAL | Status: DC | PRN

## 2015-07-30 MED ORDER — SODIUM CHLORIDE 0.9 % IJ SOLN
INTRAMUSCULAR | Status: AC
  Filled 2015-07-30: qty 10

## 2015-07-30 MED ORDER — ARTIFICIAL TEARS OP OINT
TOPICAL_OINTMENT | OPHTHALMIC | Status: DC | PRN
  Administered 2015-07-30: 1 via OPHTHALMIC

## 2015-07-30 MED ORDER — SUGAMMADEX SODIUM 200 MG/2ML IV SOLN
INTRAVENOUS | Status: AC
Start: 2015-07-30 — End: 2015-07-30
  Filled 2015-07-30: qty 2

## 2015-07-30 MED ORDER — LIDOCAINE-EPINEPHRINE 0.5 %-1:200000 IJ SOLN
INTRAMUSCULAR | Status: DC | PRN
  Administered 2015-07-30: 13 mL via INTRADERMAL

## 2015-07-30 MED ORDER — LACTATED RINGERS IV SOLN
INTRAVENOUS | Status: DC | PRN
  Administered 2015-07-30 (×2): via INTRAVENOUS

## 2015-07-30 MED ORDER — VECURONIUM BROMIDE 10 MG IV SOLR
INTRAVENOUS | Status: DC | PRN
  Administered 2015-07-30: 4 mg via INTRAVENOUS
  Administered 2015-07-30: 2 mg via INTRAVENOUS

## 2015-07-30 MED ORDER — SODIUM CHLORIDE 0.9% FLUSH
3.0000 mL | Freq: Two times a day (BID) | INTRAVENOUS | Status: DC
  Administered 2015-08-02: 3 mL via INTRAVENOUS

## 2015-07-30 MED ORDER — BISACODYL 5 MG PO TBEC
5.0000 mg | DELAYED_RELEASE_TABLET | Freq: Every day | ORAL | Status: DC | PRN
  Administered 2015-08-03 – 2015-08-05 (×2): 5 mg via ORAL
  Filled 2015-07-30 (×2): qty 1

## 2015-07-30 MED ORDER — PROPOFOL 10 MG/ML IV BOLUS
INTRAVENOUS | Status: DC | PRN
Start: 2015-07-30 — End: 2015-07-30
  Administered 2015-07-30: 100 mg via INTRAVENOUS

## 2015-07-30 MED ORDER — ALBUTEROL SULFATE HFA 108 (90 BASE) MCG/ACT IN AERS
INHALATION_SPRAY | RESPIRATORY_TRACT | Status: DC | PRN
  Administered 2015-07-30: 8 via RESPIRATORY_TRACT

## 2015-07-30 MED ORDER — PHENYLEPHRINE HCL 10 MG/ML IJ SOLN
INTRAMUSCULAR | Status: AC
  Filled 2015-07-30: qty 1

## 2015-07-30 MED ORDER — VECURONIUM BROMIDE 10 MG IV SOLR
INTRAVENOUS | Status: AC
  Filled 2015-07-30: qty 10

## 2015-07-30 MED ORDER — ONDANSETRON HCL 4 MG/2ML IJ SOLN: 4.0000 mg | Freq: Four times a day (QID) | INTRAMUSCULAR | Status: DC | PRN

## 2015-07-30 MED ORDER — HEMOSTATIC AGENTS (NO CHARGE) OPTIME
TOPICAL | Status: DC | PRN
  Administered 2015-07-30: 1 via TOPICAL

## 2015-07-30 MED ORDER — MAGNESIUM CITRATE PO SOLN
1.0000 | Freq: Once | ORAL | Status: AC | PRN
  Administered 2015-08-02: 1 via ORAL
  Filled 2015-07-30 (×2): qty 296

## 2015-07-30 MED ORDER — DEXAMETHASONE 4 MG PO TABS
4.0000 mg | ORAL_TABLET | Freq: Four times a day (QID) | ORAL | Status: AC
  Administered 2015-07-31 (×2): 4 mg via ORAL
  Filled 2015-07-30 (×2): qty 1

## 2015-07-30 MED ORDER — ALBUTEROL SULFATE HFA 108 (90 BASE) MCG/ACT IN AERS
INHALATION_SPRAY | RESPIRATORY_TRACT | Status: AC
  Filled 2015-07-30: qty 6.7

## 2015-07-30 SURGICAL SUPPLY — 52 items
APL SKNCLS STERI-STRIP NONHPOA (GAUZE/BANDAGES/DRESSINGS)
BAG DECANTER FOR FLEXI CONT (MISCELLANEOUS) ×2 IMPLANT
BENZOIN TINCTURE PRP APPL 2/3 (GAUZE/BANDAGES/DRESSINGS) IMPLANT
BLADE CLIPPER SURG (BLADE) IMPLANT
BUR MATCHSTICK NEURO 3.0 LAGG (BURR) ×2 IMPLANT
CANISTER SUCT 3000ML PPV (MISCELLANEOUS) ×2 IMPLANT
DECANTER SPIKE VIAL GLASS SM (MISCELLANEOUS) ×2 IMPLANT
DRAPE LAPAROTOMY 100X72X124 (DRAPES) ×2 IMPLANT
DRAPE MICROSCOPE LEICA (MISCELLANEOUS) ×2 IMPLANT
DRAPE POUCH INSTRU U-SHP 10X18 (DRAPES) ×2 IMPLANT
DRAPE SURG 17X23 STRL (DRAPES) ×2 IMPLANT
DURAPREP 26ML APPLICATOR (WOUND CARE) ×2 IMPLANT
ELECT REM PT RETURN 9FT ADLT (ELECTROSURGICAL) ×2
ELECTRODE REM PT RTRN 9FT ADLT (ELECTROSURGICAL) ×1 IMPLANT
GAUZE SPONGE 4X4 12PLY STRL (GAUZE/BANDAGES/DRESSINGS) IMPLANT
GAUZE SPONGE 4X4 16PLY XRAY LF (GAUZE/BANDAGES/DRESSINGS) IMPLANT
GLOVE BIO SURGEON STRL SZ 6.5 (GLOVE) ×3 IMPLANT
GLOVE BIOGEL PI IND STRL 6.5 (GLOVE) IMPLANT
GLOVE BIOGEL PI INDICATOR 6.5 (GLOVE) ×1
GLOVE ECLIPSE 6.5 STRL STRAW (GLOVE) ×2 IMPLANT
GLOVE ECLIPSE 8.0 STRL XLNG CF (GLOVE) ×1 IMPLANT
GLOVE EXAM NITRILE LRG STRL (GLOVE) IMPLANT
GLOVE EXAM NITRILE MD LF STRL (GLOVE) IMPLANT
GLOVE EXAM NITRILE XL STR (GLOVE) IMPLANT
GLOVE EXAM NITRILE XS STR PU (GLOVE) IMPLANT
GLOVE INDICATOR 8.5 STRL (GLOVE) ×1 IMPLANT
GOWN STRL REUS W/ TWL LRG LVL3 (GOWN DISPOSABLE) ×2 IMPLANT
GOWN STRL REUS W/ TWL XL LVL3 (GOWN DISPOSABLE) IMPLANT
GOWN STRL REUS W/TWL 2XL LVL3 (GOWN DISPOSABLE) ×1 IMPLANT
GOWN STRL REUS W/TWL LRG LVL3 (GOWN DISPOSABLE) ×4
GOWN STRL REUS W/TWL XL LVL3 (GOWN DISPOSABLE)
KIT BASIN OR (CUSTOM PROCEDURE TRAY) ×2 IMPLANT
KIT ROOM TURNOVER OR (KITS) ×2 IMPLANT
LIQUID BAND (GAUZE/BANDAGES/DRESSINGS) ×2 IMPLANT
NDL HYPO 25X1 1.5 SAFETY (NEEDLE) ×1 IMPLANT
NDL SPNL 18GX3.5 QUINCKE PK (NEEDLE) IMPLANT
NEEDLE HYPO 25X1 1.5 SAFETY (NEEDLE) ×2 IMPLANT
NEEDLE SPNL 18GX3.5 QUINCKE PK (NEEDLE) IMPLANT
NS IRRIG 1000ML POUR BTL (IV SOLUTION) ×2 IMPLANT
PACK LAMINECTOMY NEURO (CUSTOM PROCEDURE TRAY) ×2 IMPLANT
PAD ARMBOARD 7.5X6 YLW CONV (MISCELLANEOUS) ×6 IMPLANT
RUBBERBAND STERILE (MISCELLANEOUS) ×4 IMPLANT
SPONGE LAP 4X18 X RAY DECT (DISPOSABLE) IMPLANT
SPONGE SURGIFOAM ABS GEL SZ50 (HEMOSTASIS) ×2 IMPLANT
STRIP CLOSURE SKIN 1/2X4 (GAUZE/BANDAGES/DRESSINGS) IMPLANT
SUT VIC AB 0 CT1 18XCR BRD8 (SUTURE) ×1 IMPLANT
SUT VIC AB 0 CT1 8-18 (SUTURE) ×4
SUT VIC AB 2-0 CT1 18 (SUTURE) ×2 IMPLANT
SUT VIC AB 3-0 SH 8-18 (SUTURE) ×2 IMPLANT
TOWEL OR 17X24 6PK STRL BLUE (TOWEL DISPOSABLE) ×2 IMPLANT
TOWEL OR 17X26 10 PK STRL BLUE (TOWEL DISPOSABLE) ×2 IMPLANT
WATER STERILE IRR 1000ML POUR (IV SOLUTION) ×2 IMPLANT

## 2015-07-30 NOTE — Progress Notes (Signed)
Subjective:  Surgery postponed yesterday.  Currently no c/o SOB or chest pain   Objective:  Vital Signs in the last 24 hours: BP 150/74 mmHg  Pulse 103  Temp(Src) 98.4 F (36.9 C) (Oral)  Resp 20  Ht 5\' 11"  (1.803 m)  Wt 105.824 kg (233 lb 4.8 oz)  BMI 32.55 kg/m2  SpO2 93%  Physical Exam: Pleasant WM in NAD Lungs:  Clear Cardiac:  Regular rhythm, normal S1 and S2, no S3 Abdomen:  Soft, nontender, no masses Extremities:  No edema present  Intake/Output from previous day: 05/15 0701 - 05/16 0700 In: -  Out: 2202 [Urine:2200; Stool:2]  Weight Filed Weights   07/24/15 2217  Weight: 105.824 kg (233 lb 4.8 oz)    Lab Results: Basic Metabolic Panel:  Recent Labs  07/29/15 0317 07/30/15 0635  NA 141 138  K 4.1 4.5  CL 107 100*  CO2 26 26  GLUCOSE 52* 310*  BUN 25* 26*  CREATININE 1.49* 1.63*   CBC:  Recent Labs  07/29/15 0317 07/30/15 0635  WBC 9.4 9.9  NEUTROABS 6.8 7.7  HGB 12.6* 12.7*  HCT 39.4 39.6  MCV 95.4 95.7  PLT 185 190    Assessment/Plan: Cardiac stable for surgery still.  Mild increase in creatinine need to watch Troponin elevation nonspecific   Rec:  Keep on telemetry post op and continue beta blocker.      Kerry Hough  MD [facility]  07/30/2015, 1:11 PM

## 2015-07-30 NOTE — Anesthesia Procedure Notes (Signed)
Procedure Name: Intubation Date/Time: 07/30/2015 6:42 PM Performed by: Rejeana Brock L Pre-anesthesia Checklist: Patient identified, Timeout performed, Emergency Drugs available, Suction available and Patient being monitored Patient Re-evaluated:Patient Re-evaluated prior to inductionOxygen Delivery Method: Circle system utilized Preoxygenation: Pre-oxygenation with 100% oxygen Intubation Type: IV induction Ventilation: Mask ventilation without difficulty and Oral airway inserted - appropriate to patient size Laryngoscope Size: Mac and 4 Grade View: Grade I Tube type: Oral Tube size: 7.5 mm Number of attempts: 1 Airway Equipment and Method: Stylet Placement Confirmation: ETT inserted through vocal cords under direct vision,  positive ETCO2 and breath sounds checked- equal and bilateral Secured at: 22 cm Tube secured with: Tape Dental Injury: Teeth and Oropharynx as per pre-operative assessment

## 2015-07-30 NOTE — Anesthesia Preprocedure Evaluation (Signed)
Anesthesia Evaluation  Patient identified by MRN, date of birth, ID band Patient awake    Reviewed: Allergy & Precautions, NPO status , Patient's Chart, lab work & pertinent test results  Airway Mallampati: II   Neck ROM: full    Dental   Pulmonary shortness of breath, asthma , sleep apnea , COPD, former smoker,    breath sounds clear to auscultation       Cardiovascular hypertension, + CAD and + CABG   Rhythm:regular Rate:Normal  CABG x32yrs ago.  No recent CP.  Recent stress test no ischemia.  Cleared by cardiology.   Neuro/Psych PSYCHIATRIC DISORDERS Anxiety    GI/Hepatic (+) Hepatitis -  Endo/Other  diabetes, Type 2obese  Renal/GU Renal InsufficiencyRenal disease     Musculoskeletal  (+) Arthritis ,   Abdominal   Peds  Hematology   Anesthesia Other Findings   Reproductive/Obstetrics                             Anesthesia Physical Anesthesia Plan  ASA: III  Anesthesia Plan: General   Post-op Pain Management:    Induction: Intravenous  Airway Management Planned: Oral ETT  Additional Equipment:   Intra-op Plan:   Post-operative Plan: Extubation in OR  Informed Consent: I have reviewed the patients History and Physical, chart, labs and discussed the procedure including the risks, benefits and alternatives for the proposed anesthesia with the patient or authorized representative who has indicated his/her understanding and acceptance.     Plan Discussed with: CRNA, Anesthesiologist and Surgeon  Anesthesia Plan Comments:         Anesthesia Quick Evaluation

## 2015-07-30 NOTE — Progress Notes (Signed)
CM spoke with [place] CSW at [place], [place] and informed her of the patients admission and surgery scheduled for today. Jacob [name] stated they were aware of his admission and to [facility] any discharge needs. CM will continue to follow for d/c needs.

## 2015-07-30 NOTE — Op Note (Signed)
07/24/2015 - 07/30/2015  9:24 PM  PATIENT:  Jacob [name]  75 y.o. male with severe pain and weakness bilaterally lower extremities. MRI shows severe lumbar stenosis and a large HNP at L3/4 bilaterally. I have recommended operative decompression to relieve the spinal canal.   PRE-OPERATIVE DIAGNOSIS:  Lumbar Herniated Nucleus Pulposus, Lumbar three-four  POST-OPERATIVE DIAGNOSIS:  Lumbar Herniated Nucleus Pulposus, Lumbar three-four  PROCEDURE:  Procedure(s): LUMBAR LAMINECTOMY DISCECTOMY L3/4 bilateral  SURGEON:   Surgeon(s): Ashok Pall, MD Kristeen Miss, MD  ASSISTANTS:Elsner, Mallie Mussel  ANESTHESIA:   general  EBL:  Total I/O In: 1600 [I.V.:1600] Out: 360 [Urine:60; Blood:300]  BLOOD ADMINISTERED:none  CELL SAVER GIVEN:none  COUNT:per nursing  DRAINS: none   SPECIMEN:  No Specimen  DICTATION: [name] was taken to the operating room, intubated and placed under a general anesthetic without difficulty. He was positioned prone on a Wilson frame with all pressure points padded. His back was prepped and draped in a sterile manner. I opened the skin with a 10 blade and carried the dissection down to the thoracolumbar fascia. I used both sharp dissection and the monopolar cautery to expose the lamina of L3, and L4 bilaterally. I confirmed my location with an intraoperative xray.  I used the drill, Kerrison punches, and curettes to perform a semihemilaminectomies of L3. We used the punches to remove the ligamentum flavum to expose the thecal sac. I brought the microscope into the operative field and with Dr.Elsner's assistance we started our decompression of the spinal canal, thecal sac and L3, and L4 root(s). I cauterized epidural veins overlying the disc space then divided them sharply. I opened the disc space bilaterally with a 15 blade and proceeded with the discectomy. We used pituitary rongeurs, curettes, and other instruments to remove disc material. After the discectomy was  completed we inspected the L4 nerve roots and felt they were well decompressed. I explored rostrally, laterally, medially, and caudally and was satisfied with the decompression. I irrigated the wound, then closed in layers. I approximated the thoracolumbar fascia, subcutaneous, and subcuticular planes with vicryl sutures. I used dermabond for a sterile dressing.   PLAN OF CARE: Admit to inpatient   PATIENT DISPOSITION:  PACU - hemodynamically stable.   Delay start of Pharmacological VTE agent (>24hrs) due to surgical blood loss or risk of bleeding:  yes

## 2015-07-30 NOTE — Progress Notes (Signed)
Name: Jacob [name] MRN: [name] DOB: 1940-10-20    ADMISSION DATE:  07/24/2015 CONSULTATION DATE:  5/11  REFERRING MD :  Dr. Christella Noa  CHIEF COMPLAINT:  fall  BRIEF PATIENT DESCRIPTION: 75 year old male with COPD, pulmonary sarcoid, and OSA admitted to [facility] after fall. Requires lumbar laminectomy and discectomy. Pulmonary consulted for pre-op assessment.   SIGNIFICANT EVENTS    STUDIES:  MRI L spine 5/9 > Multilevel degenerative changes of the spine with varying degrees of central canal and foraminal stenosis as described above. Most notably there is marked central canal stenosis L3/L4 secondary to disc bulge and superimposed disc extrusion. Additionally, moderate central canal stenosis L4/L5 with a right foraminal disc extrusion resulting in moderate right foraminal stenosis  SUBJECTIVE:  Awaiting surgery   VITAL SIGNS: Temp:  [98.4 F (36.9 C)-99.1 F (37.3 C)] 98.4 F (36.9 C) (05/16 0910) Pulse Rate:  [80-105] 103 (05/16 0910) Resp:  [18-21] 20 (05/16 0910) BP: (130-159)/(66-86) 150/74 mmHg (05/16 0910) SpO2:  [92 %-99 %] 93 % (05/16 0922) Room air   PHYSICAL EXAMINATION: General:  Elderly male in NAD, up in chair  Neuro:  Oriented x3. Alert HEENT:  [place]/AT, PERRL, no JVD Cardiovascular:  RRR, no MRG Lungs:  Faint basilar rales, no wheeze. No accessory use  Abdomen:  Soft, non-tender, non-distended Musculoskeletal:  No acute deformity Skin:  Grossly intact  PULMONARY No results for input(s): PHART, PCO2ART, PO2ART, HCO3, TCO2, O2SAT in the last 168 hours.  Invalid input(s): PCO2, PO2  CBC  Recent Labs Lab 07/28/15 0405 07/29/15 0317 07/30/15 0635  HGB 12.4* 12.6* 12.7*  HCT 37.7* 39.4 39.6  WBC 10.0 9.4 9.9  PLT 206 185 190    COAGULATION  Recent Labs Lab 07/25/15 0005  INR 1.06    CARDIAC    Recent Labs Lab 07/27/15 0309 07/27/15 0650  TROPONINI 0.14* 0.14*   No results for input(s): PROBNP in the last 168  hours.   CHEMISTRY  Recent Labs Lab 07/25/15 0005 07/27/15 1812 07/28/15 0405 07/29/15 0317 07/30/15 0635  NA 137 137 146* 141 138  K 4.7 4.3 4.0 4.1 4.5  CL 101 105 110 107 100*  CO2 24 24 27 26 26   GLUCOSE 361* 148* 55* 52* 310*  BUN 26* 37* 33* 25* 26*  CREATININE 1.91* 1.71* 1.54* 1.49* 1.63*  CALCIUM 10.1 9.0 9.2 9.0 9.0  MG  --  1.9 2.3 2.2 2.0  PHOS  --  3.2 3.1 3.0 3.1   Estimated Creatinine Clearance: 48.5 mL/min (by C-G formula based on Cr of 1.63).   LIVER  Recent Labs Lab 07/25/15 0005  AST 23  ALT 17  ALKPHOS 73  BILITOT 1.9*  PROT 6.9  ALBUMIN 3.4*  INR 1.06     INFECTIOUS No results for input(s): LATICACIDVEN, PROCALCITON in the last 168 hours.   ENDOCRINE CBG (last 3)   Recent Labs  07/29/15 1616 07/29/15 2120 07/30/15 0550  GLUCAP 167* 221* 288*         IMAGING x48h  - image(s) personally visualized  -   highlighted in bold No results found.      ASSESSMENT / PLAN: Severe lumbar stenosis -->for surgery 5/16 - per neurosurgery  Restrictive Lung disease  H/o pulmonary Sarcoid OSA ->moderate risk for complications - main would be HCAP, Pulmonary edema and DVT/PE -> CT findings showing new cylindrical BTX in dependent bases w/ calcified granuloma in right LL.  Plan - cont nebs - add IS and flutter  -  continue Singulair - CPAP at HS  Troponin leak  -post op tele and BB  Renal insuff-->improved Plan Renal dose meds   We will be available as needed.    Erick Colace ACNP-BC [place] Pager # [phone] OR # [phone] if no answer  07/30/2015 10:59 AM

## 2015-07-30 NOTE — Transfer of Care (Signed)
Immediate Anesthesia Transfer of Care Note  Patient: Jacob [name]  Procedure(s) Performed: Procedure(s) with comments: LUMBAR LAMINECTOMY DISCECTOMY  (N/A) - LUMBAR LAMINECTOMY DISCECTOMY   Patient Location: PACU  Anesthesia Type:General  Level of Consciousness: awake  Airway & Oxygen Therapy: Patient Spontanous Breathing and Patient connected to face mask oxygen  Post-op Assessment: Report given to RN, Post -op Vital signs reviewed and stable and Patient moving all extremities X 4  Post vital signs: Reviewed and stable  Last Vitals:  Filed Vitals:   07/30/15 2145 07/30/15 2205  BP: 141/78 142/72  Pulse: 93 92  Temp:  36.6 C  Resp: 18 16    Last Pain:  Filed Vitals:   07/30/15 2208  PainSc: 0-No pain         Complications: No apparent anesthesia complications

## 2015-07-31 ENCOUNTER — Encounter (HOSPITAL_COMMUNITY): Payer: Self-pay | Admitting: Neurosurgery

## 2015-07-31 LAB — GLUCOSE, CAPILLARY
Glucose-Capillary: 353 mg/dL — ABNORMAL HIGH (ref 65–99)
Glucose-Capillary: 213 mg/dL — ABNORMAL HIGH (ref 65–99)
Glucose-Capillary: 171 mg/dL — ABNORMAL HIGH (ref 65–99)
Glucose-Capillary: 90 mg/dL (ref 65–99)

## 2015-07-31 LAB — POCT I-STAT 4, (NA,K, GLUC, HGB,HCT)
Glucose, Bld: 299 mg/dL — ABNORMAL HIGH (ref 65–99)
HCT: 33 % — ABNORMAL LOW (ref 39.0–52.0)
Hemoglobin: 11.2 g/dL — ABNORMAL LOW (ref 13.0–17.0)
Potassium: 5.3 mmol/L — ABNORMAL HIGH (ref 3.5–5.1)
Sodium: 137 mmol/L (ref 135–145)

## 2015-07-31 MED ORDER — SODIUM CHLORIDE 0.9% FLUSH
10.0000 mL | INTRAVENOUS | Status: DC | PRN
Start: 2015-07-31 — End: 2015-08-09
  Administered 2015-08-01: 20 mL
  Administered 2015-08-02 – 2015-08-06 (×2): 10 mL
  Filled 2015-07-31 (×3): qty 40

## 2015-07-31 MED ORDER — DIAZEPAM 5 MG PO TABS
5.0000 mg | ORAL_TABLET | Freq: Four times a day (QID) | ORAL | Status: DC | PRN
  Administered 2015-08-01 – 2015-08-09 (×9): 5 mg via ORAL
  Filled 2015-07-31 (×9): qty 1

## 2015-07-31 MED ORDER — SODIUM CHLORIDE 0.9 % IV SOLN
INTRAVENOUS | Status: DC
  Administered 2015-08-01: 02:00:00 via INTRAVENOUS

## 2015-07-31 NOTE — Progress Notes (Signed)
OT Cancellation Note  Patient Details Name: Jacob [name] MRN: [id number] DOB: [birth year]   Cancelled Treatment:     pt lethargic and will assess at a more appropriate time. Family requesting SNF placement. Please notify SW of need for placement  Vonita Moss   OTR/L Pager: [phone] Office: [phone] .  07/31/2015, 1:47 PM

## 2015-07-31 NOTE — Evaluation (Signed)
Physical Therapy Evaluation Patient Details Name: Jacob [name] MRN: [name] DOB: November 29, 1940 Today's Date: 07/31/2015   History of Present Illness  Jacob [name] 75 y.o. male with severe pain and weakness bilaterally lower extremities. MRI shows severe lumbar stenosis and a large HNP at L3/4 bilaterally. Pt underwent L3/4 bilateral lumbar laminectomy on 5/16.  Clinical Impression  Pt extremely lethargic, most likely due to recent administration of dilaudid. Pt required maximal assist to attain EOB sitting. Unable to advance to OOB mobility this date due to lethargy. Pt to benefit from ST-SNF to achieve safe mod I level of function to return home with spouse.    Follow Up Recommendations SNF;Supervision/Assistance - 24 hour    Equipment Recommendations  None recommended by PT    Recommendations for Other Services       Precautions / Restrictions Precautions Precautions: Fall;Back Precaution Booklet Issued: No Precaution Comments: pt extremely lethargic and unable to comperhend explanation of precautions Restrictions Weight Bearing Restrictions: No      Mobility  Bed Mobility Overal bed mobility: +2 for physical assistance;Needs Assistance Bed Mobility: Rolling;Sidelying to Sit;Sit to Sidelying Rolling: Max assist Sidelying to sit: Max assist     Sit to sidelying: Max assist;+2 for physical assistance General bed mobility comments: extremely labored, decreased voluntary efforts due to delayed processing due to lethargy from medication  Transfers                 General transfer comment: unable due to pt's lethargy and inability to even sit EOB  Ambulation/Gait                Stairs            Wheelchair Mobility    Modified Rankin (Stroke Patients Only)       Balance Overall balance assessment: Needs assistance Sitting-balance support: Feet supported;Single extremity supported Sitting balance-Leahy Scale: Poor Sitting balance -  Comments: pt unable to maintain midline, pt leaning posteriorly and to the Right, mod/maxA to maintain EOB balance                                     Pertinent Vitals/Pain Pain Assessment: 0-10 Pain Score: 5  Pain Location: operative site Pain Intervention(s): Premedicated before session (pt received dilaudid 45 min PTA)    Home Living Family/patient expects to be discharged to:: Skilled nursing facility Living Arrangements: Spouse/significant other               Additional Comments: per granddaughter pt lives with spouse in retirement community but its not handicapped accessible and there is no assistance    Prior Function Level of Independence: Needs assistance   Gait / Transfers Assistance Needed: began using RW in the last few months  ADL's / Homemaking Assistance Needed: indep  Comments: was driving     Hand Dominance   Dominant Hand: Right    Extremity/Trunk Assessment   Upper Extremity Assessment: Generalized weakness           Lower Extremity Assessment: Generalized weakness      Cervical / Trunk Assessment:  (recent back surgery)  Communication   Communication: Expressive difficulties (due to lethargy)  Cognition Arousal/Alertness: Lethargic;Suspect due to medications (just received dilaudid) Behavior During Therapy: Flat affect Overall Cognitive Status:  (pt extremely lethargic but able to answer questions )  General Comments General comments (skin integrity, edema, etc.): bilat LE with forearm edema    Exercises        Assessment/Plan    PT Assessment Patient needs continued PT services  PT Diagnosis Difficulty walking;Generalized weakness   PT Problem List Decreased strength;Decreased range of motion;Decreased activity tolerance;Decreased balance;Decreased coordination;Decreased mobility;Decreased cognition;Decreased knowledge of use of DME;Decreased safety awareness  PT Treatment  Interventions DME instruction;Gait training;Stair training;Functional mobility training;Therapeutic activities;Therapeutic exercise;Balance training   PT Goals (Current goals can be found in the Care Plan section) Acute Rehab PT Goals Patient Stated Goal: didn't state PT Goal Formulation: With patient/family Time For Goal Achievement: 08/07/15 Potential to Achieve Goals: Good    Frequency Min 5X/week   Barriers to discharge Decreased caregiver support spouse home but unable to physically assist pt    Co-evaluation               End of Session   Activity Tolerance: Patient limited by lethargy Patient left: in bed;with call bell/phone within reach;with bed alarm set;with family/visitor present;with nursing/sitter in room Nurse Communication: Mobility status         Time: [id number] PT Time Calculation (min) (ACUTE ONLY): 23 min   Charges:   PT Evaluation $PT Eval Moderate Complexity: 1 Procedure PT Treatments $Therapeutic Activity: 8-22 mins   PT G Codes:        Kingsley Callander 07/31/2015, 12:14 PM   Kittie Plater, PT, DPT Pager #: [phone] Office #: [phone]

## 2015-08-01 LAB — BASIC METABOLIC PANEL
Anion gap: 11 (ref 5–15)
BUN: 27 mg/dL — ABNORMAL HIGH (ref 6–20)
CO2: 25 mmol/L (ref 22–32)
Calcium: 8.5 mg/dL — ABNORMAL LOW (ref 8.9–10.3)
Chloride: 102 mmol/L (ref 101–111)
Creatinine, Ser: 1.54 mg/dL — ABNORMAL HIGH (ref 0.61–1.24)
GFR calc Af Amer: 49 mL/min — ABNORMAL LOW (ref >60)
GFR calc non Af Amer: 42 mL/min — ABNORMAL LOW (ref >60)
Glucose, Bld: 154 mg/dL — ABNORMAL HIGH (ref 65–99)
Potassium: 4 mmol/L (ref 3.5–5.1)
Sodium: 138 mmol/L (ref 135–145)

## 2015-08-01 LAB — GLUCOSE, CAPILLARY
Glucose-Capillary: 111 mg/dL — ABNORMAL HIGH (ref 65–99)
Glucose-Capillary: 208 mg/dL — ABNORMAL HIGH (ref 65–99)
Glucose-Capillary: 190 mg/dL — ABNORMAL HIGH (ref 65–99)
Glucose-Capillary: 29 mg/dL — CL (ref 65–99)
Glucose-Capillary: 110 mg/dL — ABNORMAL HIGH (ref 65–99)

## 2015-08-01 MED ORDER — DEXTROSE 50 % IV SOLN
INTRAVENOUS | Status: AC
  Administered 2015-08-01: 50 mL
  Filled 2015-08-01: qty 50

## 2015-08-01 MED ORDER — SODIUM CHLORIDE 0.9 % IV SOLN
INTRAVENOUS | Status: DC
  Administered 2015-08-01: 21:00:00 via INTRAVENOUS

## 2015-08-01 NOTE — Progress Notes (Signed)
Hypoglycemic Event  CBG: 29  Treatment: D50 IV 50 mL   Symptoms: None  Follow-up CBG: Time:2250 CBG Result:110  Possible Reasons for Event: Medication regimen: Yes  Comments/MD notified:Yes    Lisabeth Devoid

## 2015-08-01 NOTE — Evaluation (Signed)
Occupational Therapy Evaluation Patient Details Name: Jacob [name] MRN: [name] DOB: 1940/04/28 Today's Date: 08/01/2015    History of Present Illness DELMAR BOTH 75 y.o. male with severe pain and weakness bilaterally lower extremities. MRI shows severe lumbar stenosis and a large HNP at L3/4 bilaterally. Pt underwent L3/4 bilateral lumbar laminectomy on 5/16.   Clinical Impression   Pt reports he was independent with ADLs PTA. Currently pt overall min assist for functional mobility, min guard for ADLs in sitting, and mod-max assist for LB ADLs. Pt fatigues very quickly with short distance functional mobility in room and requires max verbal cues throughout for posture. Recommend SNF for follow up in order to maximize independence and safety with ADLs and functional mobility prior to return home. Pt would benefit from continued skilled OT to address established goals.    Follow Up Recommendations  SNF;Supervision/Assistance - 24 hour    Equipment Recommendations  Other (comment) (TBD at SNF)    Recommendations for Other Services       Precautions / Restrictions Precautions Precautions: Fall;Back Precaution Comments: Pt able to recall 1/3 back precautions. Reviewed all precautions with pt. Restrictions Weight Bearing Restrictions: No      Mobility Bed Mobility Overal bed mobility: Needs Assistance Bed Mobility: Rolling;Sidelying to Sit;Sit to Sidelying Rolling: Min assist Sidelying to sit: Min assist;HOB elevated     Sit to sidelying: Min assist;HOB elevated General bed mobility comments: HOB elevated with use of bed rail. Increased time required. Max +2 to pull up in bed.  Transfers Overall transfer level: Needs assistance Equipment used: Rolling walker (2 wheeled) Transfers: Sit to/from Stand Sit to Stand: Min assist Stand pivot transfers: Min assist       General transfer comment: Min assist to boost up from EOB and for balance in standing. VCs for hand  placement and technique.    Balance Overall balance assessment: Needs assistance Sitting-balance support: Feet supported;No upper extremity supported Sitting balance-Leahy Scale: Good     Standing balance support: Bilateral upper extremity supported Standing balance-Leahy Scale: Poor Standing balance comment: RW for support                            ADL Overall ADL's : Needs assistance/impaired Eating/Feeding: Set up;Sitting   Grooming: Min guard;Sitting   Upper Body Bathing: Min guard;Sitting   Lower Body Bathing: Moderate assistance;Sit to/from stand   Upper Body Dressing : Min guard;Sitting   Lower Body Dressing: Maximal assistance;Sit to/from stand   Toilet Transfer: Minimal assistance;Ambulation;BSC;RW Toilet Transfer Details (indicate cue type and reason): Simulated by sit to stand from chair Toileting- Clothing Manipulation and Hygiene: Maximal assistance;Sit to/from stand       Functional mobility during ADLs: Minimal assistance;Rolling walker General ADL Comments: Pt fatigues quickly with short distance ambulation in room; consistent verbal cues for standing up tall and leaning anteriorly during mobility. Discussed short term SNF placement; pt is agreeable,     Vision     Perception     Praxis      Pertinent Vitals/Pain Pain Assessment: 0-10 Pain Score: 5  Pain Location: back Pain Descriptors / Indicators: Aching;Grimacing;Guarding;Operative site guarding Pain Intervention(s): Limited activity within patient's tolerance;Monitored during session     Hand Dominance Right   Extremity/Trunk Assessment Upper Extremity Assessment Upper Extremity Assessment: Generalized weakness   Lower Extremity Assessment Lower Extremity Assessment: Defer to PT evaluation   Cervical / Trunk Assessment Cervical / Trunk Assessment: Other exceptions Cervical /  Trunk Exceptions: s/p lumbar sx   Communication Communication Communication: No difficulties    Cognition Arousal/Alertness: Awake/alert Behavior During Therapy: WFL for tasks assessed/performed Overall Cognitive Status: Within Functional Limits for tasks assessed       Memory: Decreased recall of precautions             General Comments       Exercises       Shoulder Instructions      Home Living Family/patient expects to be discharged to:: Skilled nursing facility                                        Prior Functioning/Environment Level of Independence: Independent with assistive device(s)  Gait / Transfers Assistance Needed: began using RW in the last few months          OT Diagnosis: Generalized weakness;Acute pain   OT Problem List: Decreased strength;Decreased activity tolerance;Impaired balance (sitting and/or standing);Decreased safety awareness;Decreased knowledge of use of DME or AE;Decreased knowledge of precautions;Obesity;Pain   OT Treatment/Interventions: Self-care/ADL training;Energy conservation;DME and/or AE instruction;Therapeutic activities;Patient/family education;Balance training    OT Goals(Current goals can be found in the care plan section) Acute Rehab OT Goals Patient Stated Goal: increase mobility/independence OT Goal Formulation: With patient Time For Goal Achievement: 08/15/15 Potential to Achieve Goals: Good ADL Goals Pt Will Perform Grooming: with supervision;standing Pt Will Perform Lower Body Bathing: with supervision;sit to/from stand (with or without AE) Pt Will Perform Lower Body Dressing: with supervision;sit to/from stand (with or without AE) Pt Will Transfer to Toilet: with supervision;ambulating;bedside commode (BSC over toilet) Pt Will Perform Toileting - Clothing Manipulation and hygiene: with supervision;sit to/from stand (with or without AE) Additional ADL Goal #1: Pt will independently verbally recall 3/3 back precautions and maintain during ADL.  OT Frequency: Min 2X/week   Barriers to D/C:  Decreased caregiver support  wife unable to assist physically at home       Co-evaluation              End of Session Equipment Utilized During Treatment: Gait belt;Rolling walker Nurse Communication: Mobility status  Activity Tolerance: Patient limited by fatigue Patient left: in bed;with call bell/phone within reach;with bed alarm set;with family/visitor present   Time: [id number] OT Time Calculation (min): 25 min Charges:  OT General Charges $OT Visit: 1 Procedure OT Evaluation $OT Eval Moderate Complexity: 1 Procedure OT Treatments $Therapeutic Activity: 8-22 mins G-Codes:     Binnie Kand M.S., OTR/L [phone]  08/01/2015, 3:24 PM

## 2015-08-01 NOTE — Progress Notes (Signed)
Patient ID: Jacob [name], male   DOB: 02-12-1941, 75 y.o.   MRN: [name] BP 151/62 mmHg  Pulse 74  Temp(Src) 98.4 F (36.9 C) (Oral)  Resp 16  Ht 5\' 11"  (1.803 m)  Wt 105.824 kg (233 lb 4.8 oz)  BMI 32.55 kg/m2  SpO2 98% Alert and oriented x 4 Moving all extremities well Wound is clean dry and without signs of infection Complaining of spasms in lower extremities Has valium and tizanidine ordered Labs ordered tomorrow Continue pt, ot.

## 2015-08-01 NOTE — Anesthesia Postprocedure Evaluation (Signed)
Anesthesia Post Note  Patient: Jacob [name]  Procedure(s) Performed: Procedure(s) (LRB): LUMBAR LAMINECTOMY DISCECTOMY  (N/A)  Patient location during evaluation: PACU Anesthesia Type: General Level of consciousness: awake and alert Pain management: pain level controlled Vital Signs Assessment: post-procedure vital signs reviewed and stable Respiratory status: spontaneous breathing, nonlabored ventilation, respiratory function stable and patient connected to nasal cannula oxygen Cardiovascular status: blood pressure returned to baseline and stable Postop Assessment: no signs of nausea or vomiting Anesthetic complications: no    Last Vitals:  Filed Vitals:   08/01/15 0136 08/01/15 0537  BP: 135/76 148/79  Pulse: 78 82  Temp: 36.4 C 36.3 C  Resp: 16 16    Last Pain:  Filed Vitals:   08/01/15 0538  PainSc: 7                  Tyiana Hill S

## 2015-08-01 NOTE — Progress Notes (Signed)
Subjective:  Tolerated OR OK.  Currently no c/o SOB or chest pain. Only c/o is back pain.   Objective:  Vital Signs in the last 24 hours: BP 148/79 mmHg  Pulse 82  Temp(Src) 97.4 F (36.3 C) (Oral)  Resp 16  Ht 5\' 11"  (1.803 m)  Wt 105.824 kg (233 lb 4.8 oz)  BMI 32.55 kg/m2  SpO2 97%  Physical Exam: Pleasant WM in NAD Lungs:  Clear Cardiac:  Regular rhythm, normal S1 and S2, no S3  Extremities:  No edema present  Intake/Output from previous day: 05/17 0701 - 05/18 0700 In: 600  Out: -   Weight Filed Weights   07/24/15 2217  Weight: 105.824 kg (233 lb 4.8 oz)    Lab Results: Basic Metabolic Panel:  Recent Labs  07/30/15 0635 07/30/15 1936  NA 138 137  K 4.5 5.3*  CL 100*  --   CO2 26  --   GLUCOSE 310* 299*  BUN 26*  --   CREATININE 1.63*  --    CBC:  Recent Labs  07/30/15 0635 07/30/15 1936  WBC 9.9  --   NEUTROABS 7.7  --   HGB 12.7* 11.2*  HCT 39.6 33.0*  MCV 95.7  --   PLT 190  --     Assessment/Plan: 1. CAD stable and tolerated OR well 2. Mild renal insufficiency needs recheck  Rec:  Recheck renal function.  Might reduce IV fluids some since taking pos.  Check EKG.    Kerry Hough  MD [facility] Cardiology  08/01/2015, 8:40 AM

## 2015-08-01 NOTE — Progress Notes (Signed)
Physical Therapy Treatment Patient Details Name: Jacob [name] MRN: [name] DOB: April 19, 1940 Today's Date: 08/01/2015    History of Present Illness Jacob [name] 75 y.o. male with severe pain and weakness bilaterally lower extremities. MRI shows severe lumbar stenosis and a large HNP at L3/4 bilaterally. Pt underwent L3/4 bilateral lumbar laminectomy on 5/16.    PT Comments    Pt more alert today and able to participate. He was sitting in recliner upon arrival. Pt reported his left knee buckled this AM while ambulating to recliner from the bathroom this AM resulting in a near fall. This made him fearful of ambulating increased distances. Pt stood with RW and completed 10 minisquats and 10 reps of marching in place with min guard assist. He would report he felt his legs getting weak, requiring sitting rest breaks. Depending on length of acute care stay, pt may progress to being able to return home with HHPT. At this time, SNF is still the appropriate d/c plan.    Follow Up Recommendations  SNF;Supervision/Assistance - 24 hour     Equipment Recommendations  None recommended by PT    Recommendations for Other Services       Precautions / Restrictions Precautions Precautions: Fall;Back Precaution Comments: Educated pt on 3/3 back precautions. Reviewed handout.    Mobility  Bed Mobility Overal bed mobility: Needs Assistance   Rolling: Min assist Sidelying to sit: Min assist       General bed mobility comments: +rail, pt more alert and able to participate today.  Transfers Overall transfer level: Needs assistance Equipment used: Rolling walker (2 wheeled) Transfers: Sit to/from [facility] Sit to Stand: Min assist Stand pivot transfers: Min assist       General transfer comment: verbal cues for sequencing, safety and hand placement  Ambulation/Gait Ambulation/Gait assistance: Min assist;+2 safety/equipment Ambulation Distance (Feet): 10  Feet Assistive device: Rolling walker (2 wheeled) Gait Pattern/deviations: Step-through pattern;Decreased stride length Gait velocity: decreased Gait velocity interpretation: Below normal speed for age/gender General Gait Details: Chair follow for safety due to knee buckling   Stairs            Wheelchair Mobility    Modified Rankin (Stroke Patients Only)       Balance Overall balance assessment: Needs assistance Sitting-balance support: No upper extremity supported;Feet supported Sitting balance-Leahy Scale: Good     Standing balance support: Bilateral upper extremity supported;During functional activity Standing balance-Leahy Scale: Poor Standing balance comment: Pt heavily reliant on RW during stance.                    Cognition Arousal/Alertness: Awake/alert Behavior During Therapy: WFL for tasks assessed/performed Overall Cognitive Status: Within Functional Limits for tasks assessed       Memory: Decreased recall of precautions              Exercises      General Comments        Pertinent Vitals/Pain Pain Assessment: 0-10 Pain Score: 5  Pain Location: sx site Pain Descriptors / Indicators: Sore Pain Intervention(s): Monitored during session    Home Living                      Prior Function            PT Goals (current goals can now be found in the care plan section) Acute Rehab PT Goals Patient Stated Goal: home PT Goal Formulation: With patient Time For Goal Achievement: 08/07/15 Potential  to Achieve Goals: Good Progress towards PT goals: Progressing toward goals    Frequency  Min 5X/week    PT Plan Current plan remains appropriate    Co-evaluation             End of Session Equipment Utilized During Treatment: Gait belt Activity Tolerance: Patient tolerated treatment well Patient left: in chair;with call bell/phone within reach;with chair alarm set     Time: [id number] PT Time Calculation (min)  (ACUTE ONLY): 25 min  Charges:  $Gait Training: 8-22 mins $Therapeutic Activity: 8-22 mins                    G Codes:      Lorriane Shire 08/01/2015, 11:59 AM

## 2015-08-02 LAB — GLUCOSE, CAPILLARY
Glucose-Capillary: 75 mg/dL (ref 65–99)
Glucose-Capillary: 53 mg/dL — ABNORMAL LOW (ref 65–99)
Glucose-Capillary: 120 mg/dL — ABNORMAL HIGH (ref 65–99)
Glucose-Capillary: 77 mg/dL (ref 65–99)
Glucose-Capillary: 64 mg/dL — ABNORMAL LOW (ref 65–99)
Glucose-Capillary: 70 mg/dL (ref 65–99)
Glucose-Capillary: 130 mg/dL — ABNORMAL HIGH (ref 65–99)
Glucose-Capillary: 42 mg/dL — CL (ref 65–99)
Glucose-Capillary: 77 mg/dL (ref 65–99)

## 2015-08-02 LAB — BASIC METABOLIC PANEL
Anion gap: 9 (ref 5–15)
BUN: 23 mg/dL — ABNORMAL HIGH (ref 6–20)
CO2: 29 mmol/L (ref 22–32)
Calcium: 8.4 mg/dL — ABNORMAL LOW (ref 8.9–10.3)
Chloride: 104 mmol/L (ref 101–111)
Creatinine, Ser: 1.51 mg/dL — ABNORMAL HIGH (ref 0.61–1.24)
GFR calc Af Amer: 50 mL/min — ABNORMAL LOW (ref >60)
GFR calc non Af Amer: 43 mL/min — ABNORMAL LOW (ref >60)
Glucose, Bld: 60 mg/dL — ABNORMAL LOW (ref 65–99)
Potassium: 3.7 mmol/L (ref 3.5–5.1)
Sodium: 142 mmol/L (ref 135–145)

## 2015-08-02 MED ORDER — DEXTROSE 50 % IV SOLN
INTRAVENOUS | Status: AC
  Administered 2015-08-02: 25 mL
  Filled 2015-08-02: qty 50

## 2015-08-02 MED ORDER — DEXTROSE-NACL 5-0.9 % IV SOLN
INTRAVENOUS | Status: DC
  Administered 2015-08-02 – 2015-08-03 (×2): via INTRAVENOUS

## 2015-08-02 MED ORDER — INSULIN ASPART PROT & ASPART (70-30 MIX) 100 UNIT/ML ~~LOC~~ SUSP
40.0000 [IU] | Freq: Two times a day (BID) | SUBCUTANEOUS | Status: DC
  Administered 2015-08-02 – 2015-08-05 (×4): 40 [IU] via SUBCUTANEOUS
  Filled 2015-08-02: qty 10

## 2015-08-02 MED ORDER — DEXTROSE 50 % IV SOLN
INTRAVENOUS | Status: AC
  Administered 2015-08-02: 50 mL
  Filled 2015-08-02: qty 50

## 2015-08-02 NOTE — Progress Notes (Signed)
Physical Therapy Treatment Patient Details Name: Jacob [name] MRN: [id number] DOB: May 11, 1940 Today's Date: 08/02/2015    History of Present Illness Jacob [name] 75 y.o. male with severe pain and weakness bilaterally lower extremities. MRI shows severe lumbar stenosis and a large HNP at L3/4 bilaterally. Pt underwent L3/4 bilateral lumbar laminectomy on 5/16.    PT Comments    Pt reports he is not feeling well and is ill-appearing, reporting nausea and fatigue. Pt agreeable to OOB to chair. Unable to progress gait today.  Follow Up Recommendations  SNF;Supervision/Assistance - 24 hour     Equipment Recommendations  None recommended by PT    Recommendations for Other Services       Precautions / Restrictions Precautions Precautions: Fall;Back Precaution Comments: Reviewed 3/3 back precautions.    Mobility  Bed Mobility     Rolling: Min assist Sidelying to sit: HOB elevated;Mod assist       General bed mobility comments: increased time to complete at above assist level  Transfers   Equipment used: Rolling walker (2 wheeled)   Sit to Stand: +2 safety/equipment;Min assist Stand pivot transfers: +2 safety/equipment;Mod assist       General transfer comment: increased time to complete. Verbal cues for hand placement and sequencing.  Ambulation/Gait             General Gait Details: unable to address ambulation due to pt not feeling well.   Stairs            Wheelchair Mobility    Modified Rankin (Stroke Patients Only)       Balance                                    Cognition Arousal/Alertness: Awake/alert Behavior During Therapy: WFL for tasks assessed/performed Overall Cognitive Status: Within Functional Limits for tasks assessed       Memory: Decreased recall of precautions              Exercises      General Comments        Pertinent Vitals/Pain Pain Assessment: 0-10 Pain Score: 5  Pain Location:  back Pain Descriptors / Indicators: Sore;Grimacing;Guarding;Operative site guarding Pain Intervention(s): Limited activity within patient's tolerance;Monitored during session;Repositioned;Premedicated before session    Home Living                      Prior Function            PT Goals (current goals can now be found in the care plan section) Acute Rehab PT Goals Patient Stated Goal: increase mobility/independence PT Goal Formulation: With patient Time For Goal Achievement: 08/07/15 Potential to Achieve Goals: Good Progress towards PT goals: Progressing toward goals    Frequency  Min 5X/week    PT Plan Current plan remains appropriate    Co-evaluation             End of Session Equipment Utilized During Treatment: Gait belt Activity Tolerance: Patient limited by fatigue Patient left: in chair;with call bell/phone within reach;with chair alarm set     Time: [id number] PT Time Calculation (min) (ACUTE ONLY): 22 min  Charges:  $Therapeutic Activity: 8-22 mins                    G Codes:      Lorriane Shire 08/02/2015, 12:37 PM

## 2015-08-02 NOTE — Clinical Social Work Note (Addendum)
CSW met with patient and his family to discuss SNF placement.  Patient has [employer] ([id number]) as his main insurance and [employer] as his secondary insurance.  Patient's family gave [facility] admissions copy of his insurance card to update in the system.  CSW informed patient and his family that his [employer] insurance should pay for SNF.  Patient and his family would like a SNF in [facility], patient has been faxed out to SNFs awaiting bed offers.  CSW to continue to follow patient's progress.  Jones Broom. [place], MSW, [place] 08/02/2015 6:07 PM

## 2015-08-02 NOTE — Clinical Social Work Note (Signed)
Clinical Social Work Assessment  Patient Details  Name: Jacob [name] MRN: [name] Date of Birth: Aug 10, 1940  Date of referral:  08/02/15               Reason for consult:  Facility Placement                Permission sought to share information with:  Family Supports Permission granted to share information::  Yes, Verbal Permission Granted  Name::     Cadarius, Ryals [phone] or [phone] or Dove,Debra Daughter [phone]  Agency::  SNF admissions  Relationship::     Contact Information:     Housing/Transportation Living arrangements for the past 2 months:  [place] of Information:  Patient, Spouse, Adult Children Patient Interpreter Needed:  None Criminal Activity/Legal Involvement Pertinent to Current Situation/Hospitalization:  No - Comment as needed Significant Relationships:  Adult Children, Spouse Lives with:  Spouse Do you feel safe going back to the place where you live?  No (Patient needs some short term rehab before he can return back home.) Need for family participation in patient care:  Yes (Comment) (Patient requests to have family help with decision making.)  Care giving concerns:  Patient and his family feel he needs some short term rehab before he can go home.   Social Worker assessment / plan: Patient is a 75 year old male who is married and lives with his wife.  [name] and daughter were at bedside, patient states he has never been to SNF before for short term rehab.  CSW explained to patient and his family what to expect when going to SNF for rehab.  CSW explained to patient and his family about how insurance will pay for stay at [facility].  Patient's family would like a SNF in [place] area.  Patient does have [employer] insurance along with [facility].  Patient's [employer] advantage is the primary insurance plan.  CSW asked family if they had any questions and they said no.  CSW explained to them what the role is of CSW  to help find placement for patient.  Employment status:  Retired Forensic scientist:  IT sales professional, Programmer, applications PT Recommendations:  [place] / Referral to community resources:     Patient/Family's Response to care:  Patient and family in agreement to going to SNF for short term rehab.  Patient/Family's Understanding of and Emotional Response to Diagnosis, Current Treatment, and Prognosis:  Patient and family agreeable to SNF for rehab.  Emotional Assessment Appearance:  Appears stated age Attitude/Demeanor/Rapport:    Affect (typically observed):  Appropriate, Pleasant, Calm, Stable Orientation:  Oriented to Self, Oriented to Place, Oriented to  Time, Oriented to Situation Alcohol / Substance use:  Not Applicable Psych involvement (Current and /or in the community):  No (Comment)  Discharge Needs  Concerns to be addressed:  Lack of Support Readmission within the last 30 days:  No Current discharge risk:  Lack of support system Barriers to Discharge:  Ship broker, Continued Medical Work up   Anell Barr 08/02/2015, 5:54 PM

## 2015-08-02 NOTE — Clinical Social Work Placement (Signed)
   CLINICAL SOCIAL WORK PLACEMENT  NOTE  Date:  08/02/2015  Patient Details  Name: Jacob [name] MRN: [name] Date of Birth: [birth year]  Clinical Social Work is seeking post-discharge placement for this patient at the [place] level of care (*CSW will initial, date and re-position this form in  chart as items are completed):  Yes   Patient/family provided with [facility] Work Department's list of facilities offering this level of care within the geographic area requested by the patient (or if unable, by the patient's family).  Yes   Patient/family informed of their freedom to choose among providers that offer the needed level of care, that participate in Medicare, Medicaid or managed care program needed by the patient, have an available bed and are willing to accept the patient.  Yes   Patient/family informed of [facility]'s ownership interest in [facility] and [facility], as well as of the fact that they are under no obligation to receive care at these facilities.  PASRR submitted to EDS on 08/02/15     PASRR number received on 08/02/15     Existing PASRR number confirmed on       FL2 transmitted to all facilities in geographic area requested by pt/family on 08/02/15     FL2 transmitted to all facilities within larger geographic area on 08/02/15     Patient informed that his/her managed care company has contracts with or will negotiate with certain facilities, including the following:            Patient/family informed of bed offers received.  Patient chooses bed at       Physician recommends and patient chooses bed at      Patient to be transferred to   on  .  Patient to be transferred to facility by       Patient family notified on   of transfer.  Name of family member notified:        PHYSICIAN Please sign FL2     Additional Comment:    _______________________________________________ Ross Ludwig,  LCSWA 08/02/2015, 5:58 PM

## 2015-08-02 NOTE — Progress Notes (Signed)
Hypoglycemic Event  CBG: 53  Treatment: D50 IV 50 mL  Symptoms: None  Follow-up CBG: [id number] CBG Result:120  Possible Reasons for Event: Medication regimen: and and Change in activity  Comments/MD notified:yes    Adjei-Sakyi, Zelphia [place]

## 2015-08-02 NOTE — Progress Notes (Addendum)
Inpatient Diabetes Program Recommendations  AACE/ADA: New Consensus Statement on Inpatient Glycemic Control (2015)  Target Ranges:  Prepandial:   less than 140 mg/dL      Peak postprandial:   less than 180 mg/dL (1-2 hours)      Critically ill patients:  140 - 180 mg/dL   Results for Jacob [name], Jacob [name] (MRN [name]) as of 08/02/2015 08:34  Ref. Range 08/01/2015 06:32 08/01/2015 11:06 08/01/2015 16:23 08/01/2015 22:16 08/01/2015 22:55 08/02/2015 02:03 08/02/2015 04:52 08/02/2015 05:53  Glucose-Capillary Latest Ref Range: 65-99 mg/dL 111 (H) 208 (H) 190 (H) 29 (LL) 110 (H) 75 53 (L) 120 (H)   Review of Glycemic Control  Diabetes history: DM 2 Outpatient Diabetes medications: 70/30 75 units BID, Victoza 1.2 Daily Current orders for Inpatient glycemic control: 70/30 80 units BID  Inpatient Diabetes Program Recommendations: Insulin - Basal: Patient had hypoglycemia yesterday of 29 and 50's this am. Please consider decreasing 70/30 dose to 40 Units BID. Note Patient only had one dose of 70/30 yesterday  Thanks,  Tama Headings RN, MSN, [facility] Inpatient Diabetes Coordinator Team Pager [phone] (8a-5p)

## 2015-08-02 NOTE — Progress Notes (Signed)
Pt with no signs of hypoglycemia. Pt BS was checked by tech and reported to nurse 64 at 12:44pm, this nurse administered orange juice, crackers, and lunch then rechecked BS was 70.  Pt family at bedside. Pt with no noted distress or signs of hypoglycemia. When Md arrived to unit, this nurse notified him that BS was low prior to lunch along with intervention. This nurse then suggested to Md if pt can be put on D5. New order placed by Md.  Md aware of diabetic coordinator recommendation.  BS at this time 130. Pt sitting up encouraged to eat dinner. No family at bedside.  Md was also notified that family requested call with concerns related to pain medication. Will continue to monitor.

## 2015-08-02 NOTE — NC FL2 (Signed)
[place] LEVEL OF CARE SCREENING TOOL     IDENTIFICATION  Patient Name: Jacob [name] Birthdate: 04-08-1940 Sex: male Admission Date (Current Location): 07/24/2015  [facility] and [place] Number:  Herbalist and Address:  [facility], [place] [address], [place], [place] [address]      Provider Number: [id number]  Attending Physician Name and Address:  Ashok Pall, MD  Relative Name and Phone Number:  Anirudh, Givins [phone] or [phone] or Dove,Debra Daughter [phone]    Current Level of Care: Hospital Recommended Level of Care: [place] Prior Approval Number:    Date Approved/Denied:   PASRR Number: [id number]  Discharge Plan: SNF    Current Diagnoses: Patient Active Problem List   Diagnosis Date Noted  . Preoperative respiratory examination 07/27/2015  . OSA (obstructive sleep apnea) 07/27/2015  . Renal insufficiency 07/27/2015  . HNP (herniated nucleus pulposus), lumbar 07/24/2015  . Right kidney mass   . CHF with left ventricular diastolic dysfunction, NYHA class 2 ([place]) 01/28/2014  . PMR (polymyalgia rheumatica) ([place]) 09/09/2013  . Rheumatoid arthritis(714.0) 04/13/2012  . CAD (coronary artery disease) 12/19/2011  . Diabetes mellitus, type 2 ([place]) 12/19/2011  . Neuropathy ([place]) 12/19/2011    Orientation RESPIRATION BLADDER Height & Weight     Self, Time, Situation, Place    Continent Weight: 233 lb 4.8 oz (105.824 kg) Height:  5\' 11"  (180.3 cm)  BEHAVIORAL SYMPTOMS/MOOD NEUROLOGICAL BOWEL NUTRITION STATUS      Continent Diet (Carb Modified)  AMBULATORY STATUS COMMUNICATION OF NEEDS Skin   Limited Assist Verbally Surgical wounds                       Personal Care Assistance Level of Assistance  Bathing, Dressing Bathing Assistance: Limited assistance   Dressing Assistance: Limited assistance     Functional Limitations Info  Sight, Hearing, Speech Sight Info:  Adequate Hearing Info: Adequate Speech Info: Adequate    SPECIAL CARE FACTORS FREQUENCY  PT (By licensed PT), OT (By licensed OT)     PT Frequency: 5x a week OT Frequency: 5x a week            Contractures      Additional Factors Info  Allergies, Code Status, Insulin Sliding Scale Code Status Info: Full Code Allergies Info: ACE INHIBITORS, ACTONEL, CIPROCINONIDE, FLUNISOLIDE, METFORMIN AND RELATED, SERTRALINE, SULINDAC, TERAZOSIN   Insulin Sliding Scale Info: 3x a day       Current Medications (08/02/2015):  This is the current hospital active medication list Current Facility-Administered Medications  Medication Dose Route Frequency Provider Last Rate Last Dose  . 0.9 %  sodium chloride infusion  250 mL Intravenous Continuous Ashok Pall, MD   250 mL at 07/30/15 2230  . acetaminophen (TYLENOL) tablet 650 mg  650 mg Oral Q6H PRN Ashok Pall, MD   650 mg at 07/31/15 2226   Or  . acetaminophen (TYLENOL) suppository 650 mg  650 mg Rectal Q6H PRN Ashok Pall, MD      . allopurinol (ZYLOPRIM) tablet 300 mg  300 mg Oral Q lunch Ashok Pall, MD   300 mg at 08/02/15 1107  . amLODipine (NORVASC) tablet 10 mg  10 mg Oral Q supper Ashok Pall, MD   10 mg at 08/02/15 1744  . aspirin EC tablet 81 mg  81 mg Oral Daily Ashok Pall, MD   81 mg at 08/02/15 1000  . atorvastatin (LIPITOR) tablet 40 mg  40 mg  Oral q1800 Ashok Pall, MD   40 mg at 08/02/15 1744  . bisacodyl (DULCOLAX) EC tablet 5 mg  5 mg Oral Daily PRN Ashok Pall, MD      . bisacodyl (DULCOLAX) suppository 10 mg  10 mg Rectal Daily PRN Ashok Pall, MD   10 mg at 07/28/15 1701  . budesonide (PULMICORT) nebulizer solution 0.25 mg  0.25 mg Nebulization BID Brand Males, MD   0.25 mg at 08/02/15 0912  . calcitRIOL (ROCALTROL) capsule 0.25 mcg  0.25 mcg Oral Q M,W,F Ashok Pall, MD   0.25 mcg at 08/02/15 1000  . cycloSPORINE (RESTASIS) 0.05 % ophthalmic emulsion 1 drop  1 drop Both Eyes BID Ashok Pall, MD   1 drop at  08/02/15 1000  . dextrose 5 %-0.9 % sodium chloride infusion   Intravenous Continuous Ashok Pall, MD 50 mL/hr at 08/02/15 1545    . diazepam (VALIUM) tablet 5 mg  5 mg Oral Q6H PRN Ashok Pall, MD   5 mg at 08/02/15 0315  . finasteride (PROSCAR) tablet 5 mg  5 mg Oral QHS Ashok Pall, MD   5 mg at 08/01/15 2241  . gabapentin (NEURONTIN) capsule 300 mg  300 mg Oral TID Ashok Pall, MD   300 mg at 08/02/15 1600  . insulin aspart (novoLOG) injection 0-20 Units  0-20 Units Subcutaneous TID WC Ashok Pall, MD   3 Units at 08/02/15 1744  . insulin aspart protamine- aspart (NOVOLOG MIX 70/30) injection 40 Units  40 Units Subcutaneous BID WC Ashok Pall, MD   40 Units at 08/02/15 1745  . ipratropium-albuterol (DUONEB) 0.5-2.5 (3) MG/3ML nebulizer solution 3 mL  3 mL Nebulization Q6H PRN Ashok Pall, MD      . Liraglutide SOPN 1.2 mg  1.2 mg Subcutaneous Q1500 Ashok Pall, MD   1.2 mg at 08/02/15 1502  . losartan (COZAAR) tablet 100 mg  100 mg Oral QHS Ashok Pall, MD   100 mg at 08/01/15 2243  . menthol-cetylpyridinium (CEPACOL) lozenge 3 mg  1 lozenge Oral PRN Ashok Pall, MD       Or  . phenol (CHLORASEPTIC) mouth spray 1 spray  1 spray Mouth/Throat PRN Ashok Pall, MD      . metoprolol tartrate (LOPRESSOR) tablet 25 mg  25 mg Oral BID Ashok Pall, MD   25 mg at 08/02/15 1000  . montelukast (SINGULAIR) tablet 10 mg  10 mg Oral QHS Ashok Pall, MD   10 mg at 08/01/15 2243  . multivitamin (PROSIGHT) tablet 2 tablet  2 tablet Oral BID Ashok Pall, MD   2 tablet at 08/02/15 1000  . ondansetron (ZOFRAN) tablet 4 mg  4 mg Oral Q6H PRN Ashok Pall, MD       Or  . ondansetron (ZOFRAN) injection 4 mg  4 mg Intravenous Q6H PRN Ashok Pall, MD      . oxyCODONE (Oxy IR/ROXICODONE) immediate release tablet 5 mg  5 mg Oral Q4H PRN Ashok Pall, MD   5 mg at 08/02/15 0315  . pilocarpine (PILOCAR) 2 % ophthalmic solution 1 drop  1 drop Both Eyes TID Ashok Pall, MD   1 drop at 08/02/15 1510  .  senna-docusate (Senokot-S) tablet 1 tablet  1 tablet Oral QHS PRN Ashok Pall, MD   1 tablet at 07/31/15 2226  . sodium chloride flush (NS) 0.9 % injection 10-40 mL  10-40 mL Intracatheter PRN Ashok Pall, MD   10 mL at 08/02/15 0551  . sodium chloride flush (NS) 0.9 %  injection 3 mL  3 mL Intravenous Q12H Ashok Pall, MD   3 mL at 07/30/15 2230  . sodium chloride flush (NS) 0.9 % injection 3 mL  3 mL Intravenous PRN Ashok Pall, MD      . sodium phosphate (FLEET) 7-19 GM/118ML enema 1 enema  1 enema Rectal Once Kevan Ny Ditty, MD      . tamsulosin [facility]) capsule 0.4 mg  0.4 mg Oral QHS Ashok Pall, MD   0.4 mg at 08/01/15 2243  . tiZANidine (ZANAFLEX) tablet 4 mg  4 mg Oral TID PRN Ashok Pall, MD   4 mg at 08/02/15 [id number]     Discharge Medications: Please see discharge summary for a list of discharge medications.  Relevant Imaging Results:  Relevant Lab Results:   Additional Information SSN [id number] Patient has [employer] as his primary insurance member# [id number] [employer] as his secondary insurance, I have copy of his insurance card  [place], Jones Broom, LCSWA

## 2015-08-02 NOTE — Progress Notes (Signed)
Patient ID: Jacob [name], male   DOB: 25-Aug-1940, 75 y.o.   MRN: [id number] BP 164/82 mmHg  Pulse 90  Temp(Src) 98 F (36.7 C) (Oral)  Resp 20  Ht 5\' 11"  (1.803 m)  Wt 105.824 kg (233 lb 4.8 oz)  BMI 32.55 kg/m2  SpO2 96% Patient with hypoglycemia. I was not notified on either occasion in the past 24 hours.  Have changed the 70/30 per diabetes coordinator recommendation In chair currently. Declined pt earlier, felt too bad to participate.  Have stopped all iv narcotics, he has oxycodone, valium and tizanidine order for pain and  Will change iv to d5ns.

## 2015-08-02 NOTE — Progress Notes (Signed)
CBG 42. Alert and oriented.  Administered 33ml of Dextrose 50%. Rechecked 77. Will continue to monitor.

## 2015-08-03 ENCOUNTER — Inpatient Hospital Stay (HOSPITAL_COMMUNITY): Payer: PPO

## 2015-08-03 ENCOUNTER — Encounter (HOSPITAL_COMMUNITY): Payer: Self-pay | Admitting: Internal Medicine

## 2015-08-03 DIAGNOSIS — J189 Pneumonia, unspecified organism: Secondary | ICD-10-CM | POA: Diagnosis not present

## 2015-08-03 DIAGNOSIS — R4182 Altered mental status, unspecified: Secondary | ICD-10-CM

## 2015-08-03 DIAGNOSIS — J449 Chronic obstructive pulmonary disease, unspecified: Secondary | ICD-10-CM | POA: Insufficient documentation

## 2015-08-03 LAB — BLOOD GAS, ARTERIAL
Acid-Base Excess: 2.3 mmol/L — ABNORMAL HIGH (ref 0.0–2.0)
Bicarbonate: 26.7 mEq/L — ABNORMAL HIGH (ref 20.0–24.0)
Drawn by: 105521
O2 Content: 4 L/min
O2 Saturation: 90.9 %
Patient temperature: 98.6
TCO2: 28 mmol/L (ref 0–100)
pCO2 arterial: 44.4 mmHg (ref 35.0–45.0)
pH, Arterial: 7.396 (ref 7.350–7.450)
pO2, Arterial: 59.8 mmHg — ABNORMAL LOW (ref 80.0–100.0)

## 2015-08-03 LAB — COMPREHENSIVE METABOLIC PANEL
ALT: 19 U/L (ref 17–63)
AST: 19 U/L (ref 15–41)
Albumin: 2.4 g/dL — ABNORMAL LOW (ref 3.5–5.0)
Alkaline Phosphatase: 82 U/L (ref 38–126)
Anion gap: 9 (ref 5–15)
BUN: 19 mg/dL (ref 6–20)
CO2: 28 mmol/L (ref 22–32)
Calcium: 8.3 mg/dL — ABNORMAL LOW (ref 8.9–10.3)
Chloride: 103 mmol/L (ref 101–111)
Creatinine, Ser: 1.73 mg/dL — ABNORMAL HIGH (ref 0.61–1.24)
GFR calc Af Amer: 43 mL/min — ABNORMAL LOW (ref >60)
GFR calc non Af Amer: 37 mL/min — ABNORMAL LOW (ref >60)
Glucose, Bld: 135 mg/dL — ABNORMAL HIGH (ref 65–99)
Potassium: 3.9 mmol/L (ref 3.5–5.1)
Sodium: 140 mmol/L (ref 135–145)
Total Bilirubin: 1.5 mg/dL — ABNORMAL HIGH (ref 0.3–1.2)
Total Protein: 5.7 g/dL — ABNORMAL LOW (ref 6.5–8.1)

## 2015-08-03 LAB — CBC WITH DIFFERENTIAL/PLATELET
Basophils Absolute: 0 10*3/uL (ref 0.0–0.1)
Basophils Relative: 0 %
Eosinophils Absolute: 0.3 10*3/uL (ref 0.0–0.7)
Eosinophils Relative: 2 %
HCT: 37.1 % — ABNORMAL LOW (ref 39.0–52.0)
Hemoglobin: 11.6 g/dL — ABNORMAL LOW (ref 13.0–17.0)
Lymphocytes Relative: 8 %
Lymphs Abs: 1 10*3/uL (ref 0.7–4.0)
MCH: 29.7 pg (ref 26.0–34.0)
MCHC: 31.3 g/dL (ref 30.0–36.0)
MCV: 95.1 fL (ref 78.0–100.0)
Monocytes Absolute: 0.9 10*3/uL (ref 0.1–1.0)
Monocytes Relative: 7 %
Neutro Abs: 10.7 10*3/uL — ABNORMAL HIGH (ref 1.7–7.7)
Neutrophils Relative %: 83 %
Platelets: 249 10*3/uL (ref 150–400)
RBC: 3.9 MIL/uL — ABNORMAL LOW (ref 4.22–5.81)
RDW: 15.3 % (ref 11.5–15.5)
WBC: 12.9 10*3/uL — ABNORMAL HIGH (ref 4.0–10.5)

## 2015-08-03 LAB — GLUCOSE, CAPILLARY
Glucose-Capillary: 65 mg/dL (ref 65–99)
Glucose-Capillary: 133 mg/dL — ABNORMAL HIGH (ref 65–99)
Glucose-Capillary: 93 mg/dL (ref 65–99)
Glucose-Capillary: 116 mg/dL — ABNORMAL HIGH (ref 65–99)
Glucose-Capillary: 139 mg/dL — ABNORMAL HIGH (ref 65–99)
Glucose-Capillary: 125 mg/dL — ABNORMAL HIGH (ref 65–99)
Glucose-Capillary: 187 mg/dL — ABNORMAL HIGH (ref 65–99)
Glucose-Capillary: 209 mg/dL — ABNORMAL HIGH (ref 65–99)

## 2015-08-03 LAB — BRAIN NATRIURETIC PEPTIDE: B Natriuretic Peptide: 265.8 pg/mL — ABNORMAL HIGH (ref 0.0–100.0)

## 2015-08-03 LAB — TROPONIN I
Troponin I: 0.04 ng/mL — ABNORMAL HIGH
Troponin I: 0.42 ng/mL — ABNORMAL HIGH

## 2015-08-03 LAB — TSH: TSH: 1.2 u[IU]/mL (ref 0.350–4.500)

## 2015-08-03 LAB — CORTISOL: Cortisol, Plasma: 18.5 ug/dL

## 2015-08-03 LAB — SEDIMENTATION RATE: Sed Rate: 78 mm/hr — ABNORMAL HIGH (ref 0–16)

## 2015-08-03 LAB — VITAMIN B12: Vitamin B-12: 415 pg/mL (ref 180–914)

## 2015-08-03 MED ORDER — METHYLPREDNISOLONE SODIUM SUCC 125 MG IJ SOLR
60.0000 mg | Freq: Two times a day (BID) | INTRAMUSCULAR | Status: AC
  Administered 2015-08-03 – 2015-08-04 (×2): 60 mg via INTRAVENOUS
  Filled 2015-08-03 (×2): qty 2

## 2015-08-03 MED ORDER — VANCOMYCIN HCL 10 G IV SOLR
2000.0000 mg | Freq: Once | INTRAVENOUS | Status: AC
  Administered 2015-08-03: 2000 mg via INTRAVENOUS
  Filled 2015-08-03: qty 2000

## 2015-08-03 MED ORDER — PIPERACILLIN-TAZOBACTAM 3.375 G IVPB
3.3750 g | Freq: Three times a day (TID) | INTRAVENOUS | Status: DC
  Administered 2015-08-03 – 2015-08-06 (×8): 3.375 g via INTRAVENOUS
  Filled 2015-08-03 (×11): qty 50

## 2015-08-03 MED ORDER — METOPROLOL TARTRATE 50 MG PO TABS
50.0000 mg | ORAL_TABLET | Freq: Two times a day (BID) | ORAL | Status: DC
  Administered 2015-08-03 – 2015-08-09 (×12): 50 mg via ORAL
  Filled 2015-08-03 (×12): qty 1

## 2015-08-03 MED ORDER — SENNA 8.6 MG PO TABS
1.0000 | ORAL_TABLET | Freq: Every day | ORAL | Status: DC
  Administered 2015-08-03 – 2015-08-09 (×6): 8.6 mg via ORAL
  Filled 2015-08-03 (×6): qty 1

## 2015-08-03 MED ORDER — LEVOFLOXACIN IN D5W 500 MG/100ML IV SOLN
500.0000 mg | INTRAVENOUS | Status: DC
  Administered 2015-08-03: 500 mg via INTRAVENOUS
  Filled 2015-08-03 (×2): qty 100

## 2015-08-03 MED ORDER — DEXTROSE 50 % IV SOLN
INTRAVENOUS | Status: AC
  Administered 2015-08-03: 25 mL
  Filled 2015-08-03: qty 50

## 2015-08-03 MED ORDER — PREDNISONE 5 MG PO TABS
10.0000 mg | ORAL_TABLET | Freq: Every day | ORAL | Status: DC
  Administered 2015-08-04 – 2015-08-05 (×2): 10 mg via ORAL
  Filled 2015-08-03 (×2): qty 2

## 2015-08-03 MED ORDER — VANCOMYCIN HCL 10 G IV SOLR
1250.0000 mg | INTRAVENOUS | Status: DC
  Administered 2015-08-04 – 2015-08-05 (×2): 1250 mg via INTRAVENOUS
  Filled 2015-08-03 (×3): qty 1250

## 2015-08-03 MED ORDER — IPRATROPIUM-ALBUTEROL 0.5-2.5 (3) MG/3ML IN SOLN: 3.0000 mL | Freq: Four times a day (QID) | RESPIRATORY_TRACT | Status: DC

## 2015-08-03 MED ORDER — PIPERACILLIN-TAZOBACTAM 3.375 G IVPB 30 MIN
3.3750 g | Freq: Once | INTRAVENOUS | Status: AC
  Administered 2015-08-03: 3.375 g via INTRAVENOUS
  Filled 2015-08-03: qty 50

## 2015-08-03 MED ORDER — IPRATROPIUM-ALBUTEROL 0.5-2.5 (3) MG/3ML IN SOLN
3.0000 mL | Freq: Two times a day (BID) | RESPIRATORY_TRACT | Status: DC
  Administered 2015-08-03 – 2015-08-09 (×11): 3 mL via RESPIRATORY_TRACT
  Filled 2015-08-03 (×12): qty 3

## 2015-08-03 MED ORDER — BISACODYL 10 MG RE SUPP: 10.0000 mg | Freq: Every day | RECTAL | Status: DC | PRN

## 2015-08-03 NOTE — Progress Notes (Signed)
PT Cancellation Note  Patient Details Name: Jacob [name] MRN: [name] DOB: [birth year]   Cancelled Treatment:    Reason Eval/Treat Not Completed: Patient not medically ready--Per RN. PT will check on pt later as time allows.    [place] April, PTA Pager: [phone]   08/03/2015, 11:54 AM

## 2015-08-03 NOTE — Progress Notes (Signed)
Patient ID: Jacob [name], male   DOB: 1940/09/08, 75 y.o.   MRN: [name] Patient with SOB. Not lucid. Bilateral wheezing. Episodes of hypoglicemia. Spoke with son who lives in [place], [place]. Hospitalist service called for help.

## 2015-08-03 NOTE — Consult Note (Signed)
TRH H&P   Patient Demographics:    Jacob [name], is a 75 y.o. male  MRN: [id number]   DOB - 22-Oct-1940  Admit Date - 07/24/2015  Outpatient Primary MD for the patient is [name], MD  Referring MD/NP/PA:  Leeroy Cha (neurosurgery)  No chief complaint on file.     HPI:    Jacob [name]  is a 75 y.o. male, w recent back surgery  apparently altered mental status this am. Pt is less responsive and has had difficulty with hypoglycemia over the past 24 hours.  Neurosurgery requested that we evaluate the patient for AMS as well as hypoglycemia.  CXR shows ? Multifocal pneumonia.  Abg confirms hypoxia.  He has had poor po intake over the past 24 hrs and this may explain his hypoglycemia.      Review of systems:    In addition to the HPI above, No Fever-chills, No Headache, No changes with Vision or hearing, No problems swallowing food or Liquids, No Chest pain, or  Shortness of Breath,  + cough No Abdominal pain, No Nausea or Vommitting, Bowel movements are regular, No Blood in stool or Urine, No dysuria, No new skin rashes or bruises, No new joints pains-aches,  No new weakness, tingling, numbness in any extremity, No recent weight gain or loss, No polyuria, polydypsia or polyphagia, No significant Mental Stressors.  A full 10 point Review of Systems was done, except as stated above, all other Review of Systems were negative.   With Past History of the following :    Past Medical History  Diagnosis Date  . Allergy   . Asthma   . Diabetes mellitus without complication ([place])   . Cataract   . Arthritis   . Polymyalgia rheumatica (HCC)     maintained on Prednisone, Plaquenil. Followed by rhuematology every 4 months/Krysia Zahradnik.  Marland Kitchen COPD (chronic obstructive pulmonary disease) ([place])   . Anemia   . Hypertension   . BPH (benign prostatic hyperplasia)   . Sleep apnea    CPAP   Trying to use  . Shortness of breath dyspnea     with exertion  . Anxiety   . Chronic kidney disease     chronic  kidney failure  kidney function at 42%  . Coronary artery disease     CABG  7 bypasses  . Hepatitis     many years ago  . Cancer of kidney [facility])       Past Surgical History  Procedure Laterality Date  . Appendectomy    . Hernia repair    . Prostate surgery      [facility]URP at [facility].  Marland Kitchen Coronary artery bypass graft  03/17/1995    Wynonia Lawman; followed every six months.  . Cataract extraction w/phaco Right 11/28/2014    Procedure: CATARACT EXTRACTION PHACO AND INTRAOCULAR LENS PLACEMENT (IOC) RIGHT ;  Surgeon: Marylynn Pearson, MD;  Location:  [facility] OR;  Service: Ophthalmology;  Laterality: Right;  . Lumbar laminectomy/decompression microdiscectomy N/A 07/30/2015    Procedure: LUMBAR LAMINECTOMY DISCECTOMY ;  Surgeon: Ashok Pall, MD;  Location: [facility];  Service: Neurosurgery;  Laterality: N/A;  LUMBAR LAMINECTOMY DISCECTOMY       Social History:     Social History  Substance Use Topics  . Smoking status: Former Smoker    Quit date: 03/16/1957  . Smokeless tobacco: Never Used  . Alcohol Use: No       Family History :    History reviewed. No pertinent family history.    Home Medications:   Prior to Admission medications   Medication Sig Start Date End Date Taking? Authorizing Provider  albuterol (PROVENTIL HFA;VENTOLIN HFA) 108 (90 BASE) MCG/ACT inhaler Inhale 2 puffs into the lungs every 6 (six) hours as needed for wheezing or shortness of breath. 07/16/13  Yes Wardell Honour, MD  allopurinol (ZYLOPRIM) 300 MG tablet Take 300 mg by mouth daily with lunch.   Yes Historical Provider, MD  amLODipine (NORVASC) 10 MG tablet Take 10 mg by mouth daily with supper.    Yes Historical Provider, MD  aspirin EC 81 MG tablet Take 81 mg by mouth daily with lunch.   Yes Historical Provider, MD  Brinzolamide-Brimonidine [facility]) 1-0.2 % SUSP Place 1 drop into both eyes 2 (two)  times daily.    Yes Historical Provider, MD  budesonide-formoterol (SYMBICORT) 160-4.5 MCG/ACT inhaler Inhale 2 puffs into the lungs 2 (two) times daily.   Yes Historical Provider, MD  calcitRIOL (ROCALTROL) 0.25 MCG capsule Take 0.25 mcg by mouth every Monday, Wednesday, and Friday.   Yes Historical Provider, MD  cycloSPORINE (RESTASIS) 0.05 % ophthalmic emulsion Place 1 drop into both eyes 2 (two) times daily.   Yes Historical Provider, MD  finasteride (PROSCAR) 5 MG tablet Take 5 mg by mouth at bedtime.    Yes Historical Provider, MD  gabapentin (NEURONTIN) 300 MG capsule Take 1 capsule (300 mg total) by mouth 3 (three) times daily. PATIENT NEEDS OFFICE VISIT FOR ADDITIONAL REFILLS Patient taking differently: Take 300 mg by mouth 3 (three) times daily.  01/23/13  Yes Wendie Agreste, MD  insulin aspart protamine- aspart (NOVOLOG MIX 70/30) (70-30) 100 UNIT/ML injection Inject 75 Units into the skin 2 (two) times daily before a meal.    Yes Historical Provider, MD  ipratropium-albuterol (DUONEB) 0.5-2.5 (3) MG/3ML SOLN Take 3 mLs by nebulization every 6 (six) hours as needed (shortness of breath/wheezing). Reported on 03/27/2015   Yes Historical Provider, MD  Liraglutide (VICTOZA) 18 MG/3ML SOPN Inject 1.2 mg into the skin daily at 3 pm.    Yes Historical Provider, MD  losartan (COZAAR) 100 MG tablet Take 100 mg by mouth every morning.    Yes Historical Provider, MD  metoprolol (LOPRESSOR) 50 MG tablet Take 25 mg by mouth 2 (two) times daily.   Yes Historical Provider, MD  montelukast (SINGULAIR) 10 MG tablet Take 1 tablet (10 mg total) by mouth at bedtime. 07/27/13  Yes Wardell Honour, MD  Multiple Vitamins-Minerals (PRESERVISION AREDS 2 PO) Take 1 tablet by mouth 2 (two) times daily.    Yes Historical Provider, MD  pilocarpine (PILOCAR) 2 % ophthalmic solution Place 1 drop into both eyes 3 (three) times daily.   Yes Historical Provider, MD  potassium chloride SA (K-DUR,KLOR-CON) 20 MEQ tablet Take  20 mEq by mouth daily.   Yes Historical Provider, MD  predniSONE (DELTASONE) 1 MG tablet Take 2  mg by mouth daily with breakfast. IN CONJUNCTION WITH ONE 5 MG TABLET TO EQUAL A TOTAL OF 7 MILLIGRAMS   Yes Historical Provider, MD  predniSONE (DELTASONE) 5 MG tablet Take 5 mg by mouth daily with breakfast. IN CONJUNCTION WITH TWO 1 MG TABLETS TO EQUAL A TOTAL OF 7 MILLIGRAMS   Yes Historical Provider, MD  simvastatin (ZOCOR) 80 MG tablet Take 40 mg by mouth daily with supper.   Yes Historical Provider, MD  sodium chloride 0.9 % nebulizer solution Take 3 mLs by nebulization every 6 (six) hours as needed for wheezing.   Yes Historical Provider, MD  Tamsulosin HCl (FLOMAX) 0.4 MG CAPS Take 0.4 mg by mouth at bedtime.    Yes Historical Provider, MD  tiZANidine (ZANAFLEX) 4 MG capsule Take 4 mg by mouth 3 (three) times daily as needed for muscle spasms.   Yes Historical Provider, MD  furosemide (LASIX) 20 MG tablet Take 1 tablet (20 mg total) by mouth 2 (two) times daily. , as needed. NO MORE REFILLS WITHOUT OFFICE VISIT - 2ND NOTICE Patient not taking: Reported on 04/25/2015 02/18/15   Wardell Honour, MD  oxyCODONE (ROXICODONE) 15 MG immediate release tablet Take 15 mg by mouth every 4 (four) hours as needed.    Historical Provider, MD     Allergies:     Allergies  Allergen Reactions  . Ace Inhibitors Other (See Comments)    Probably nausea and vomiting per patient   . Actonel [Risedronate] Nausea And Vomiting  . Ciprocinonide [Fluocinolone] Other (See Comments)    Probably nausea and vomiting per patient  . Flunisolide Other (See Comments)    Probably nausea and vomiting per patient   . Metformin And Related Other (See Comments)    Probably nausea and vomiting per patient   . Sertraline Other (See Comments)    Probably nausea and vomiting per patient   . Sulindac Other (See Comments)    Probably nausea and vomiting per patient   . Terazosin Other (See Comments)    Probably nausea and  vomiting per patient      Physical Exam:   Vitals  Blood pressure 147/66, pulse 116, temperature 99.5 F (37.5 C), temperature source Oral, resp. rate 20, height 5' 11"  (1.803 m), weight 105.824 kg (233 lb 4.8 oz), SpO2 92 %.   1. General axox3,  lying in bed in NAD,  Able to follow commands  2. Normal affect and insight, Not Suicidal or Homicidal, Awake Alert, Oriented X 3.  3. No F.N deficits, ALL C.Nerves Intact, Strength 5/5 all 4 extremities, Sensation intact all 4 extremities, Plantars down going.  4. Ears and Eyes appear Normal, Conjunctivae clear, PERRLA. Moist Oral Mucosa.  5. Supple Neck, No JVD, No cervical lymphadenopathy appriciated, No Carotid Bruits.  6. Symmetrical Chest wall movement, Good air movement bilaterally, slight crackle left lung base, no wheeze  7. RRR, No Gallops, Rubs or Murmurs, No Parasternal Heave.  8. Positive Bowel Sounds, Abdomen Soft, No tenderness, No organomegaly appriciated,No rebound -guarding or rigidity.  9.  No Cyanosis, Normal Skin Turgor, No Skin Rash or Bruise. Incision c/d/i,   10. Good muscle tone,  joints appear normal , no effusions, Normal ROM.  11. No Palpable Lymph Nodes in Neck or Axillae   Data Review:    CBC  Recent Labs Lab 07/27/15 1812 07/28/15 0405 07/29/15 0317 07/30/15 0635 07/30/15 1936 08/03/15 1030  WBC 9.3 10.0 9.4 9.9  --  12.9*  HGB 12.2* 12.4* 12.6* 12.7*  11.2* 11.6*  HCT 38.5* 37.7* 39.4 39.6 33.0* 37.1*  PLT 204 206 185 190  --  249  MCV 95.5 95.7 95.4 95.7  --  95.1  MCH 30.3 31.5 30.5 30.7  --  29.7  MCHC 31.7 32.9 32.0 32.1  --  31.3  RDW 15.6* 15.5 15.8* 15.9*  --  15.3  LYMPHSABS 0.8 1.8 1.3 0.9  --  PENDING  MONOABS 0.8 1.0 1.1* 0.9  --  PENDING  EOSABS 0.1 0.2 0.3 0.3  --  PENDING  BASOSABS 0.0 0.0 0.0 0.0  --  PENDING   ------------------------------------------------------------------------------------------------------------------  Chemistries   Recent Labs Lab  07/27/15 1812 07/28/15 0405 07/29/15 0317 07/30/15 0635 07/30/15 1936 08/01/15 0927 08/02/15 0439  NA 137 146* 141 138 137 138 142  K 4.3 4.0 4.1 4.5 5.3* 4.0 3.7  CL 105 110 107 100*  --  102 104  CO2 24 27 26 26   --  25 29  GLUCOSE 148* 55* 52* 310* 299* 154* 60*  BUN 37* 33* 25* 26*  --  27* 23*  CREATININE 1.71* 1.54* 1.49* 1.63*  --  1.54* 1.51*  CALCIUM 9.0 9.2 9.0 9.0  --  8.5* 8.4*  MG 1.9 2.3 2.2 2.0  --   --   --    ------------------------------------------------------------------------------------------------------------------ estimated creatinine clearance is 52.3 mL/min (by C-G formula based on Cr of 1.51). ------------------------------------------------------------------------------------------------------------------ No results for input(s): TSH, T4TOTAL, T3FREE, THYROIDAB in the last 72 hours.  Invalid input(s): FREET3  Coagulation profile No results for input(s): INR, PROTIME in the last 168 hours. ------------------------------------------------------------------------------------------------------------------- No results for input(s): DDIMER in the last 72 hours. -------------------------------------------------------------------------------------------------------------------  Cardiac Enzymes No results for input(s): CKMB, TROPONINI, MYOGLOBIN in the last 168 hours.  Invalid input(s): CK ------------------------------------------------------------------------------------------------------------------ No results found for: BNP   ---------------------------------------------------------------------------------------------------------------  Urinalysis    Component Value Date/Time   BILIRUBINUR neg 09/09/2013 1233   PROTEINUR >=300 09/09/2013 1233   UROBILINOGEN 0.2 09/09/2013 1233   NITRITE neg 09/09/2013 1233   LEUKOCYTESUR Negative 09/09/2013 1233     ----------------------------------------------------------------------------------------------------------------   Imaging Results:    Dg Chest Port 1 View  08/03/2015  CLINICAL DATA:  Productive cough EXAM: PORTABLE CHEST 1 VIEW COMPARISON:  07/30/2015 FINDINGS: Cardiomegaly with pulmonary vascular congestion. Patchy opacities in the right upper lobe, left lower lobe, and right infrahilar region. Differential considerations include multifocal pneumonia versus asymmetric interstitial edema. No definite pleural effusions.  No pneumothorax. Postsurgical changes related to prior CABG. Right IJ venous catheter terminates in the mid SVC. Median sternotomy. IMPRESSION: Cardiomegaly with pulmonary vascular congestion. Multifocal patchy opacities, suspicious for multifocal pneumonia, less likely asymmetric interstitial edema. Electronically Signed   By: Julian Hy M.D.   On: 08/03/2015 11:13      Assessment & Plan:    Active Problems:   HNP (herniated nucleus pulposus), lumbar   Preoperative respiratory examination   OSA (obstructive sleep apnea)   Renal insufficiency   Altered mental status   HCAP (healthcare-associated pneumonia)    1. AMS secondary to Hcap Blood culture x2 Cbc, cmp, b12, esr, tsh, abg Check CT brain, check CXR Start vanco , zosyn iv pharmacy to dose Continue on levaquin  2. Tachycardia,  Trop i q6h x3,  Check tsh, consider echocardiogram if persistent  3. Hypoglycemia Hold novolog 70/30 Cont ssi,  Cont D51/2 ns    DVT Prophylaxis  SCD  AM Labs Ordered, also please review Full Orders  Family Communication: Admission, patients condition and plan of care including tests  being ordered have been discussed with the patient  who indicate understanding and agree with the plan and Code Status.  Code Status Full code  Condition GUARDED     Admission status: inpatient  Time spent in minutes : 45   Jani Gravel M.D on 08/03/2015 at 11:32 AM  Between  7am to 7pm - Pager - [phone]. After 7pm go to [email or web address] - password [facility]  [facility] - Office  [phone]

## 2015-08-03 NOTE — Progress Notes (Signed)
Subjective:  Not seen yesterday.  Evidently became hypoglycemic and also confused ? Narcotics.  Low O2.  Not currently SOB and no chest pain. EKG earlier today shows sinus tachycardia and ST depression laterally   Objective:  Vital Signs in the last 24 hours: BP 145/57 mmHg  Pulse 102  Temp(Src) 99.1 F (37.3 C) (Oral)  Resp 20  Ht 5\' 11"  (1.803 m)  Wt 105.824 kg (233 lb 4.8 oz)  BMI 32.55 kg/m2  SpO2 92%  Physical Exam: Pleasant WM in NAD, currently oriented Lungs:  Clear Cardiac:  Regular rhythm, normal S1 and S2, no S3  Extremities:  No edema present  Intake/Output from previous day: 05/19 0701 - 05/20 0700 In: 60 [P.O.:60] Out: 1300 [Urine:1300]  Weight Filed Weights   07/24/15 2217  Weight: 105.824 kg (233 lb 4.8 oz)    Lab Results: Basic Metabolic Panel:  Recent Labs  08/02/15 0439 08/03/15 1030  NA 142 140  K 3.7 3.9  CL 104 103  CO2 29 28  GLUCOSE 60* 135*  BUN 23* 19  CREATININE 1.51* 1.73*   CBC:  Recent Labs  08/03/15 1030  WBC 12.9*  NEUTROABS 10.7*  HGB 11.6*  HCT 37.1*  MCV 95.1  PLT 249    Assessment/Plan: 1. Hypoglycemia 2. Tachycardia with new ST depression ? Dehydration 3. CAD with prior CABG 4. Worsening renal insufficiency  5.  Possible adrenal isufficiency was not started back on Prednisone that he had been on  Prior to admission  Rec:  Recheck renal function.  Consider dose of solumedrol.  Increase beta blocker and cycle troponin.     Kerry Hough  MD [facility]  08/03/2015, 2:32 PM

## 2015-08-03 NOTE — Progress Notes (Signed)
Pharmacy Antibiotic Note  Jacob [name] is a 75 y.o. male admitted on 07/24/2015 with pneumonia.  Pharmacy has been consulted for vancomycin/zosyn dosing. Pt also on levofloxacin  Plan: Vancomycin 2000 mg x 1 then 1250 mg q 24h Zosyn IV 3.375g x 1, then Zosyn IV 3.375g q8h (4hr infusion) Continue levoflxoacin F/u renal function, clinical course, VT as SS  Height: 5\' 11"  (180.3 cm) Weight: 233 lb 4.8 oz (105.824 kg) IBW/kg (Calculated) : 75.3  Temp (24hrs), Avg:99 F (37.2 C), Min:98 F (36.7 C), Max:99.9 F (37.7 C)   Recent Labs Lab 07/27/15 1812 07/28/15 0405 07/29/15 0317 07/30/15 0635 08/01/15 0927 08/02/15 0439 08/03/15 1030  WBC 9.3 10.0 9.4 9.9  --   --  12.9*  CREATININE 1.71* 1.54* 1.49* 1.63* 1.54* 1.51* 1.73*    Estimated Creatinine Clearance: 45.7 mL/min (by C-G formula based on Cr of 1.73).    Allergies  Allergen Reactions  . Ace Inhibitors Other (See Comments)    Probably nausea and vomiting per patient   . Actonel [Risedronate] Nausea And Vomiting  . Ciprocinonide [Fluocinolone] Other (See Comments)    Probably nausea and vomiting per patient  . Flunisolide Other (See Comments)    Probably nausea and vomiting per patient   . Metformin And Related Other (See Comments)    Probably nausea and vomiting per patient   . Sertraline Other (See Comments)    Probably nausea and vomiting per patient   . Sulindac Other (See Comments)    Probably nausea and vomiting per patient   . Terazosin Other (See Comments)    Probably nausea and vomiting per patient     Antimicrobials this admission: 5/20 vanc 5/20 zosyn 5/20 levofloxacin  Microbiology results: BCx >> sent  Thank you for allowing pharmacy to be a part of this patient's care.  Angela Burke, PharmD Pharmacy Resident Pager: [phone] 08/03/2015 11:52 AM

## 2015-08-03 NOTE — Progress Notes (Addendum)
CBG 65. Oriented. Easily arousal from sleep. Administered 67ml of 50% Dextrose. Rechecked 133. Will continue to monitor. MD made aware.

## 2015-08-03 NOTE — Procedures (Signed)
Pt refuses to wear cpap will inform RT if anything changes. RT will continue to monitor.

## 2015-08-04 ENCOUNTER — Inpatient Hospital Stay (HOSPITAL_COMMUNITY): Payer: PPO

## 2015-08-04 DIAGNOSIS — Z8709 Personal history of other diseases of the respiratory system: Secondary | ICD-10-CM

## 2015-08-04 LAB — GLUCOSE, CAPILLARY
Glucose-Capillary: 375 mg/dL — ABNORMAL HIGH (ref 65–99)
Glucose-Capillary: 344 mg/dL — ABNORMAL HIGH (ref 65–99)
Glucose-Capillary: 291 mg/dL — ABNORMAL HIGH (ref 65–99)
Glucose-Capillary: 252 mg/dL — ABNORMAL HIGH (ref 65–99)

## 2015-08-04 LAB — COMPREHENSIVE METABOLIC PANEL
ALT: 21 U/L (ref 17–63)
AST: 18 U/L (ref 15–41)
Albumin: 1.9 g/dL — ABNORMAL LOW (ref 3.5–5.0)
Alkaline Phosphatase: 71 U/L (ref 38–126)
Anion gap: 12 (ref 5–15)
BUN: 33 mg/dL — ABNORMAL HIGH (ref 6–20)
CO2: 26 mmol/L (ref 22–32)
Calcium: 8 mg/dL — ABNORMAL LOW (ref 8.9–10.3)
Chloride: 97 mmol/L — ABNORMAL LOW (ref 101–111)
Creatinine, Ser: 2.06 mg/dL — ABNORMAL HIGH (ref 0.61–1.24)
GFR calc Af Amer: 35 mL/min — ABNORMAL LOW (ref >60)
GFR calc non Af Amer: 30 mL/min — ABNORMAL LOW (ref >60)
Glucose, Bld: 393 mg/dL — ABNORMAL HIGH (ref 65–99)
Potassium: 4.5 mmol/L (ref 3.5–5.1)
Sodium: 135 mmol/L (ref 135–145)
Total Bilirubin: 1.6 mg/dL — ABNORMAL HIGH (ref 0.3–1.2)
Total Protein: 5 g/dL — ABNORMAL LOW (ref 6.5–8.1)

## 2015-08-04 LAB — CBC WITH DIFFERENTIAL/PLATELET
Basophils Absolute: 0 10*3/uL (ref 0.0–0.1)
Basophils Relative: 0 %
Eosinophils Absolute: 0 10*3/uL (ref 0.0–0.7)
Eosinophils Relative: 0 %
HCT: 32.3 % — ABNORMAL LOW (ref 39.0–52.0)
Hemoglobin: 10.2 g/dL — ABNORMAL LOW (ref 13.0–17.0)
Lymphocytes Relative: 4 %
Lymphs Abs: 0.5 10*3/uL — ABNORMAL LOW (ref 0.7–4.0)
MCH: 29.3 pg (ref 26.0–34.0)
MCHC: 31.6 g/dL (ref 30.0–36.0)
MCV: 92.8 fL (ref 78.0–100.0)
Monocytes Absolute: 0.1 10*3/uL (ref 0.1–1.0)
Monocytes Relative: 1 %
Neutro Abs: 10.5 10*3/uL — ABNORMAL HIGH (ref 1.7–7.7)
Neutrophils Relative %: 95 %
Platelets: 241 10*3/uL (ref 150–400)
RBC: 3.48 MIL/uL — ABNORMAL LOW (ref 4.22–5.81)
RDW: 15 % (ref 11.5–15.5)
WBC: 11.1 10*3/uL — ABNORMAL HIGH (ref 4.0–10.5)

## 2015-08-04 LAB — RPR: RPR Ser Ql: NONREACTIVE

## 2015-08-04 LAB — TROPONIN I: Troponin I: 0.32 ng/mL — ABNORMAL HIGH (ref <0.031)

## 2015-08-04 MED ORDER — PHENOL 1.4 % MT LIQD
1.0000 | OROMUCOSAL | Status: DC | PRN
Start: 2015-08-04 — End: 2015-08-09
  Filled 2015-08-04: qty 177

## 2015-08-04 MED ORDER — INSULIN ASPART 100 UNIT/ML ~~LOC~~ SOLN: 3.0000 [IU] | Freq: Three times a day (TID) | SUBCUTANEOUS | Status: DC

## 2015-08-04 MED ORDER — INSULIN ASPART 100 UNIT/ML ~~LOC~~ SOLN
0.0000 [IU] | Freq: Three times a day (TID) | SUBCUTANEOUS | Status: DC
  Administered 2015-08-04: 11 [IU] via SUBCUTANEOUS
  Administered 2015-08-04: 15 [IU] via SUBCUTANEOUS
  Administered 2015-08-05: 11 [IU] via SUBCUTANEOUS
  Administered 2015-08-05: 7 [IU] via SUBCUTANEOUS
  Administered 2015-08-05 – 2015-08-06 (×2): 3 [IU] via SUBCUTANEOUS
  Administered 2015-08-06: 4 [IU] via SUBCUTANEOUS
  Administered 2015-08-07: 7 [IU] via SUBCUTANEOUS
  Administered 2015-08-08: 4 [IU] via SUBCUTANEOUS
  Administered 2015-08-08 – 2015-08-09 (×3): 7 [IU] via SUBCUTANEOUS
  Administered 2015-08-09: 3 [IU] via SUBCUTANEOUS

## 2015-08-04 MED ORDER — SODIUM CHLORIDE 0.9 % IV SOLN
INTRAVENOUS | Status: DC
  Administered 2015-08-04: 11:00:00 via INTRAVENOUS

## 2015-08-04 MED ORDER — INSULIN ASPART 100 UNIT/ML ~~LOC~~ SOLN
0.0000 [IU] | Freq: Every day | SUBCUTANEOUS | Status: DC
  Administered 2015-08-04 – 2015-08-07 (×2): 3 [IU] via SUBCUTANEOUS
  Administered 2015-08-08: 2 [IU] via SUBCUTANEOUS

## 2015-08-04 NOTE — Progress Notes (Signed)
Patient ID: Jacob [name], male   DOB: 02/01/1941, 75 y.o.   MRN: [name] Awake, clinically much better. Help of hospitalist and cardiology service appreciated. Dr Christella Noa will be back tomorrow

## 2015-08-04 NOTE — Progress Notes (Signed)
Pt refuses CPAP for tonight. RT will continue to monitor.

## 2015-08-04 NOTE — Progress Notes (Signed)
Subjective:  Much better today. Mild elevation of trop that may represent demand ischemia.  No chest pain.  Had sweats last night.  Given solumedrol yesterday since off of prednisone.   Objective:  Vital Signs in the last 24 hours: BP 104/57 mmHg  Pulse 84  Temp(Src) 98.1 F (36.7 C) (Oral)  Resp 18  Ht 5\' 11"  (1.803 m)  Wt 105.824 kg (233 lb 4.8 oz)  BMI 32.55 kg/m2  SpO2 98%  Physical Exam: Pleasant WM in NAD, currently oriented Lungs:  Clear Cardiac:  Regular rhythm, normal S1 and S2, no S3  Extremities:  No edema present  Intake/Output from previous day: 05/20 0701 - 05/21 0700 In: -  Out: 900 [Urine:900]  Weight Filed Weights   07/24/15 2217  Weight: 105.824 kg (233 lb 4.8 oz)    Lab Results: Basic Metabolic Panel:  Recent Labs  08/03/15 1030 08/04/15 0635  NA 140 135  K 3.9 4.5  CL 103 97*  CO2 28 26  GLUCOSE 135* 393*  BUN 19 33*  CREATININE 1.73* 2.06*   CBC:  Recent Labs  08/03/15 1030 08/04/15 0635  WBC 12.9* 11.1*  NEUTROABS 10.7* 10.5*  HGB 11.6* 10.2*  HCT 37.1* 32.3*  MCV 95.1 92.8  PLT 249 241    Assessment/Plan: 1. Hypoglycemia resolved 2. Tachycardia with new ST depression ? Dehydration -  No new EKG will order 3. CAD with prior CABG 4. Worsening renal insufficiency  5.  Possible adrenal isufficiency was not started back on Prednisone that he had been on  Prior to admission back on it now 6. pmeumonia 7. Demand ischemia  Rec:  ARB held.  Check EKG and place on telemetry.    Kerry Hough  MD [facility]  08/04/2015, 10:36 AM

## 2015-08-04 NOTE — Progress Notes (Signed)
PT refused Flutter Valve- states he has one at home and would prefer to have a family member bring it to him at the hospital.

## 2015-08-04 NOTE — Progress Notes (Signed)
Speech Language Pathology Treatment:    Patient Details Name: Jacob [name] MRN: [id number] DOB: 02/18/1941 Today's Date: 08/04/2015 Time:  -       Swallow assessment orders received. To be seen 5/22    Orbie Pyo Kendal Raffo M.Ed [employer] [phone]

## 2015-08-04 NOTE — Progress Notes (Signed)
Consult PROGRESS NOTE                                                                                                                                                                                                             Patient Demographics:    Jacob [name], is a 75 y.o. male, DOB - 22-Sep-1940, GR:[id number]  Admit date - 07/24/2015   Admitting Physician Ashok Pall, MD  Outpatient Primary MD for the patient is SMITH,KRISTI, MD  LOS - 11  No chief complaint on file.      Brief Narrative  Jacob [name] is a 76 y.o. male,  With history of asthma, type 2 diabetes mellitus on insulin, COPD, obstructive sleep apnea not compliant with CPAP, CAD, EKG stage IV baseline creatinine close to 1.7-1.9, BPH, hypertension, polymyalgia rheumatica on steroids who was admitted to neurosurgery on 07/24/2015 for a L3-4 laminectomy and discectomy by neurosurgery. Hospitalist team was consulted on 08/03/2015 when patient became less responsive with evidence of hypoglycemia and HCAP. Of note cardiology and pulmonary have also seen the patient this admission.   Subjective:    Jacob [name] today has, No headache, No chest pain, No abdominal pain - No Nausea, No new weakness tingling or numbness, No Cough - SOB.     Assessment  & Plan :    Hospitalist consult progress note    1. HCAP. Could have aspirated when he was less responsive due to hypoglycemia, continue empiric antibiotics which currently are vancomycin and Zosyn, have stopped Levaquin, if clinically improves we'll taper down rapidly and gave him only on Unasyn/Augmentin. Continue supportive care with oxygen, nebulizer treatments and flutter valve for pulmonary toiletry. We will have speech therapy evaluate him one time although this morning mentation is much better and I do not think he has any risk  for going aspiration.   2. DM type II with hypoglycemia. Likely due to lack of prednisone, note he is chronically on prednisone along with poor oral intake. Hypoglycemia has resolved, he has been replaced on prednisone, continue Lantus along with sliding scale and monitor CBGs.  Lab Results  Component Value Date   HGBA1C 7.6* 11/26/2014   CBG (last 3)   Recent Labs  08/03/15 1909 08/03/15 2103 08/04/15 0634  GLUCAP 187* 209* 375*    3.History of CAD status post CABG. Currently chest pain-free, cardiology following,  continue aspirin, statin and beta blocker for secondary prevention. Troponin mildly elevated but flat trend and with non-ACS pattern. Cardiology on board. Likely mild demand ischemia from pneumonia.  4. Polymyalgia rheumatica. Chronically on prednisone which has been resumed, if blood pressure drops will consider stress dose steroids.  5. ARF on CKD stage IV. Baseline creatinine anywhere between 1.7 and 1.9. Hold ARB,  repeat BMP in the morning.  6. Lumbar discectomy. Defer management to primary team which is neurosurgery.  7. Dyslipidemia. Continue home dose statin.  8. Gout. On home dose allopurinol.  9. BPH with history of urinary retention requiring self cath intermittently. On Flomax. Since his operation he has been on Foley catheter. We'll defer removal of Foley catheter to primary team.      DVT Prophylaxis  : Have ordered SCDs, chemical prophylaxis deferred to primary team which is neurosurgery    Lab Results  Component Value Date   PLT 241 08/04/2015    Inpatient Medications  Scheduled Meds: . allopurinol  300 mg Oral Q lunch  . amLODipine  10 mg Oral Q supper  . aspirin EC  81 mg Oral Daily  . atorvastatin  40 mg Oral q1800  . budesonide (PULMICORT) nebulizer solution  0.25 mg Nebulization BID  . calcitRIOL  0.25 mcg Oral Q M,W,F  . cycloSPORINE  1 drop Both Eyes BID  . finasteride  5 mg Oral QHS  . gabapentin  300 mg Oral TID  . insulin  aspart  0-20 Units Subcutaneous TID WC  . insulin aspart  0-5 Units Subcutaneous QHS  . insulin aspart protamine- aspart  40 Units Subcutaneous BID WC  . ipratropium-albuterol  3 mL Nebulization BID  . Liraglutide  1.2 mg Subcutaneous Q1500  . metoprolol  50 mg Oral BID  . montelukast  10 mg Oral QHS  . multivitamin  2 tablet Oral BID  . pilocarpine  1 drop Both Eyes TID  . piperacillin-tazobactam (ZOSYN)  IV  3.375 g Intravenous Q8H  . predniSONE  10 mg Oral Q breakfast  . senna  1 tablet Oral Daily  . sodium phosphate  1 enema Rectal Once  . tamsulosin  0.4 mg Oral QHS  . vancomycin  1,250 mg Intravenous Q24H   Continuous Infusions:  PRN Meds:.acetaminophen **OR** [DISCONTINUED] acetaminophen, bisacodyl, bisacodyl, diazepam, ipratropium-albuterol, [DISCONTINUED] ondansetron **OR** ondansetron (ZOFRAN) IV, oxyCODONE, senna-docusate, sodium chloride flush, tiZANidine  Antibiotics  :    Anti-infectives    Start     Dose/Rate Route Frequency Ordered Stop   08/04/15 1200  vancomycin (VANCOCIN) 1,250 mg in sodium chloride 0.9 % 250 mL IVPB     1,250 mg 166.7 mL/hr over 90 Minutes Intravenous Every 24 hours 08/03/15 1152     08/03/15 2200  piperacillin-tazobactam (ZOSYN) IVPB 3.375 g     3.375 g 12.5 mL/hr over 240 Minutes Intravenous Every 8 hours 08/03/15 1152     08/03/15 1200  levofloxacin (LEVAQUIN) IVPB 500 mg  Status:  Discontinued     500 mg 100 mL/hr over 60 Minutes Intravenous Every 24 hours 08/03/15 1130 08/04/15 0807   08/03/15 1200  vancomycin (VANCOCIN) 2,000 mg in sodium chloride 0.9 % 500 mL IVPB     2,000 mg 250 mL/hr over 120 Minutes Intravenous  Once 08/03/15 1152 08/03/15 1436   08/03/15 1200  piperacillin-tazobactam (ZOSYN) IVPB 3.375 g     3.375 g 100 mL/hr over 30 Minutes Intravenous  Once 08/03/15 1152 08/03/15 1306  Objective:   Filed Vitals:   08/04/15 0100 08/04/15 0519 08/04/15 0914 08/04/15 0930  BP: 107/64 105/59  104/57  Pulse: 94 87  84   Temp: 97.5 F (36.4 C) 97.7 F (36.5 C)  98.1 F (36.7 C)  TempSrc: Oral Oral  Oral  Resp: 20 20  18   Height:      Weight:      SpO2: 97% 95% 98% 98%    Wt Readings from Last 3 Encounters:  07/24/15 105.824 kg (233 lb 4.8 oz)  03/27/15 109.317 kg (241 lb)  11/28/14 107.956 kg (238 lb)     Intake/Output Summary (Last 24 hours) at 08/04/15 1022 Last data filed at 08/03/15 1636  Gross per 24 hour  Intake      0 ml  Output    900 ml  Net   -900 ml     Physical Exam  Awake Alert, Oriented X 3, No new F.N deficits, Normal affect [place].AT,PERRAL Supple Neck,No JVD, No cervical lymphadenopathy appriciated.  Symmetrical Chest wall movement, Good air movement bilaterally, Few rales RRR,No Gallops,Rubs or new Murmurs, No Parasternal Heave +ve B.Sounds, Abd Soft, No tenderness, No organomegaly appriciated, No rebound - guarding or rigidity. Has a Foley catheter. No Cyanosis, Clubbing or edema, No new Rash or bruise      Data Review:    CBC  Recent Labs Lab 07/29/15 0317 07/30/15 0635 07/30/15 1936 08/03/15 1030 08/04/15 0635  WBC 9.4 9.9  --  12.9* 11.1*  HGB 12.6* 12.7* 11.2* 11.6* 10.2*  HCT 39.4 39.6 33.0* 37.1* 32.3*  PLT 185 190  --  249 241  MCV 95.4 95.7  --  95.1 92.8  MCH 30.5 30.7  --  29.7 29.3  MCHC 32.0 32.1  --  31.3 31.6  RDW 15.8* 15.9*  --  15.3 15.0  LYMPHSABS 1.3 0.9  --  1.0 0.5*  MONOABS 1.1* 0.9  --  0.9 0.1  EOSABS 0.3 0.3  --  0.3 0.0  BASOSABS 0.0 0.0  --  0.0 0.0    Chemistries   Recent Labs Lab 07/29/15 0317 07/30/15 0635 07/30/15 1936 08/01/15 0927 08/02/15 0439 08/03/15 1030 08/04/15 0635  NA 141 138 137 138 142 140 135  K 4.1 4.5 5.3* 4.0 3.7 3.9 4.5  CL 107 100*  --  102 104 103 97*  CO2 26 26  --  25 29 28 26   GLUCOSE 52* 310* 299* 154* 60* 135* 393*  BUN 25* 26*  --  27* 23* 19 33*  CREATININE 1.49* 1.63*  --  1.54* 1.51* 1.73* 2.06*  CALCIUM 9.0 9.0  --  8.5* 8.4* 8.3* 8.0*  MG 2.2 2.0  --   --   --   --   --     AST  --   --   --   --   --  19 18  ALT  --   --   --   --   --  19 21  ALKPHOS  --   --   --   --   --  82 71  BILITOT  --   --   --   --   --  1.5* 1.6*   ------------------------------------------------------------------------------------------------------------------ No results for input(s): CHOL, HDL, LDLCALC, TRIG, CHOLHDL, LDLDIRECT in the last 72 hours.  Lab Results  Component Value Date   HGBA1C 7.6* 11/26/2014   ------------------------------------------------------------------------------------------------------------------  Recent Labs  08/03/15 1045  TSH 1.200   ------------------------------------------------------------------------------------------------------------------  Recent Labs  08/03/15 1030  VITAMINB12 415    Coagulation profile No results for input(s): INR, PROTIME in the last 168 hours.  No results for input(s): DDIMER in the last 72 hours.  Cardiac Enzymes  Recent Labs Lab 08/03/15 1203 08/03/15 1830 08/04/15 0032  TROPONINI 0.04* 0.42* 0.32*   ------------------------------------------------------------------------------------------------------------------    Component Value Date/Time   BNP 265.8* 08/03/2015 1203    Micro Results No results found for this or any previous visit (from the past 240 hour(s)).  Radiology Reports X-ray Chest Pa Or Ap  07/30/2015  CLINICAL DATA:  Central line placement EXAM: CHEST 1 VIEW COMPARISON:  07/25/2015 FINDINGS: Postoperative changes in the mediastinum. Placement of a right central venous catheter with tip over the upper SVC region. The catheter demonstrate somewhat lateral orientation but this is likely due to patient rotation. No pneumothorax. Cardiac enlargement without significant vascular congestion. No focal airspace disease or consolidation in the lungs. No blunting of costophrenic angles. Calcified and tortuous aorta. Calcified granuloma in the right lung base. IMPRESSION: Right central  venous catheter projects over the upper SVC region. No pneumothorax. Cardiac enlargement. Electronically Signed   By: Lucienne Capers M.D.   On: 07/30/2015 21:57   Dg Lumbar Spine 2-3 Views  07/30/2015  CLINICAL DATA:  Lumbar laminectomy and decompression microdiskectomy L1 level EXAM: LUMBAR SPINE - 2-3 VIEW COMPARISON:  Portable exam 1852 hours labeled as #1 is compared to MR of 07/24/2015 FINDINGS: No numbering system is identified on MR. Current cross-table lateral radiographic exam is therefore labeled assuming 5 lumbar vertebra. This numbering system should be correlated with any numbering system utilized on previous imaging to ensure consistency. Metallic probe via dorsal approach projects dorsal to the L4-L5 disc space at the inferior margin of the L4 spinous process. IMPRESSION: Dorsal localization of the inferior aspect of the spinous process of L4, dorsal to the L4-L5 disc space level. Electronically Signed   By: Lavonia Dana M.D.   On: 07/30/2015 21:55   Ct Head Wo Contrast  08/03/2015  CLINICAL DATA:  Is altered mental status.  Recent back surgery. EXAM: CT HEAD WITHOUT CONTRAST TECHNIQUE: Contiguous axial images were obtained from the base of the skull through the vertex without intravenous contrast. COMPARISON:  None. FINDINGS: Ventricles are normal in size, for this patient's age, and normal in configuration. There are no parenchymal masses or mass effect. There is no evidence of a cortical infarct. Mild periventricular white matter hypoattenuation is noted consistent with chronic microvascular ischemic change. Small focal hypoattenuation area in the left base a ganglia suggests old lacune infarct. There are no extra-axial masses or abnormal fluid collections. There is no intracranial hemorrhage. Visualized sinuses and mastoid air cells are clear. IMPRESSION: 1. No acute intracranial abnormalities. 2. Age related volume loss. Mild chronic microvascular ischemic change. Electronically Signed    By: Lajean Manes M.D.   On: 08/03/2015 15:16   Ct Chest High Resolution  07/28/2015  CLINICAL DATA:  75 year old male with history of sarcoidosis and COPD. History of asthma. EXAM: CT CHEST WITHOUT CONTRAST TECHNIQUE: Multidetector CT imaging of the chest was performed following the standard protocol without intravenous contrast. High resolution imaging of the lungs, as well as inspiratory and expiratory imaging, was performed. COMPARISON:  Chest CT 06/20/2010. FINDINGS: Mediastinum/Lymph Nodes: Heart size is mildly enlarged. There is no significant pericardial fluid, thickening or pericardial calcification. There is atherosclerosis of the thoracic aorta, the great vessels of the mediastinum and the coronary arteries, including calcified atherosclerotic plaque in the left  main, left anterior descending, left circumflex and right coronary arteries. Status post median sternotomy for CABG, including LIMA to the LAD. No pathologically enlarged mediastinal or hilar lymph nodes. Please note that accurate exclusion of hilar adenopathy is limited on noncontrast CT scans. Esophagus is unremarkable in appearance. No axillary lymphadenopathy. Lungs/Pleura: Large calcified granuloma with surrounding tiny calcified granulomas in the base of the right lower lobe. High-resolution images demonstrate linear areas of architectural distortion in the dependent portions of the lower lobes of the lungs bilaterally which are new compared to the prior study. This is associated with some very mild cylindrical bronchiectasis in the basal segments of the lower lobes dependently. There are no other more widespread areas of ground-glass attenuation, subpleural reticulation, traction bronchiectasis or frank honeycombing. Inspiratory and expiratory imaging demonstrates mild air trapping, indicative of small airways disease. No acute consolidative airspace disease. No pleural effusions. No suspicious appearing pulmonary nodules or masses.  Upper abdomen: A tiny calcified granuloma in the right lobe of the liver. Atherosclerosis. Musculoskeletal: There are no aggressive appearing lytic or blastic lesions noted in the visualized portions of the skeleton. IMPRESSION: 1. Interval development of what appear to be areas of post infectious or inflammatory scarring in the lower lobes of the lungs bilaterally. This is associated with some very mild cylindrical bronchiectasis in the basal segments of the lower lobes of the lungs as well. 2. The above imaging findings are not typical imaging manifestations of sarcoidosis. Additionally, there is no lymphadenopathy or other typical manifestations of sarcoidosis in the thorax. 3. No evidence to suggest other interstitial lung disease. 4. Mild air trapping, indicative of mild small airways disease. 5. Small calcified granulomas in the right lower lobe and right lobe of the liver again noted. 6. Atherosclerosis, including left main and 3 vessel coronary artery disease. Status post median sternotomy for CABG, including LIMA to the LAD. Electronically Signed   By: Vinnie Langton M.D.   On: 07/28/2015 07:14   Dg Chest Port 1 View  08/03/2015  CLINICAL DATA:  Productive cough EXAM: PORTABLE CHEST 1 VIEW COMPARISON:  07/30/2015 FINDINGS: Cardiomegaly with pulmonary vascular congestion. Patchy opacities in the right upper lobe, left lower lobe, and right infrahilar region. Differential considerations include multifocal pneumonia versus asymmetric interstitial edema. No definite pleural effusions.  No pneumothorax. Postsurgical changes related to prior CABG. Right IJ venous catheter terminates in the mid SVC. Median sternotomy. IMPRESSION: Cardiomegaly with pulmonary vascular congestion. Multifocal patchy opacities, suspicious for multifocal pneumonia, less likely asymmetric interstitial edema. Electronically Signed   By: Julian Hy M.D.   On: 08/03/2015 11:13   Dg Chest Port 1 View  07/25/2015  CLINICAL  DATA:  Preop respiratory exam for lumbar spine disc herniation. Coronary artery disease, COPD, and chronic kidney disease. EXAM: PORTABLE CHEST 1 VIEW COMPARISON:  03/29/2014 FINDINGS: Mild cardiomegaly stable. No evidence pulmonary infiltrate or edema. No evidence pleural effusion. Tiny calcified granuloma in the right lung base remains stable. Prior CABG again noted. IMPRESSION: Stable mild cardiomegaly.  No active lung disease. Electronically Signed   By: Earle Gell M.D.   On: 07/25/2015 21:55   Mr Outside Films Spine  07/26/2015  This examination belongs to an outside facility and is stored here for comparison purposes only.  Contact the originating outside institution for any associated report or interpretation.   Time Spent in minutes  30   Lala Lund K M.D on 08/04/2015 at 10:22 AM  Between 7am to 7pm - Pager - [phone]  After 7pm go  to [email or web address] - password [facility]  [facility] -  Office  [phone]

## 2015-08-04 NOTE — Progress Notes (Signed)
Physical Therapy Treatment Patient Details Name: Jacob [name] MRN: [name] DOB: 29-Aug-1940 Today's Date: 08/04/2015    History of Present Illness Jacob [name] 75 y.o. male with severe pain and weakness bilaterally lower extremities. MRI shows severe lumbar stenosis and a large HNP at L3/4 bilaterally. Pt underwent L3/4 bilateral lumbar laminectomy on 5/16. Bout with AMS, fever 5/20; Noted pna on chest x-ray    PT Comments    Much improved activity tolerance, and able to get up bed to chair; Used supplemental O2 with O2 sats staying at or above 92%;  Continue to recommend post-acute rehab to maximize independence and safety with mobility prior to dc home.  Follow Up Recommendations  SNF;Supervision/Assistance - 24 hour     Equipment Recommendations  None recommended by PT    Recommendations for Other Services       Precautions / Restrictions Precautions Precautions: Fall;Back Precaution Booklet Issued: No Precaution Comments: Reviewed 3/3 back precautions.    Mobility  Bed Mobility Overal bed mobility: Needs Assistance Bed Mobility: Rolling;Sidelying to Sit Rolling: Min assist Sidelying to sit: HOB elevated;Mod assist       General bed mobility comments: increased time to complete at above assist level  Transfers Overall transfer level: Needs assistance Equipment used: Rolling walker (2 wheeled) Transfers: Sit to/from Stand Sit to Stand: +2 safety/equipment;Min assist         General transfer comment: Cues for prepositioning, safety, and hand placement; Overall good rise, min assist to steady; heavy cues to control descent from stand to sit with good results  Ambulation/Gait Ambulation/Gait assistance: Min guard;+2 safety/equipment Ambulation Distance (Feet):  (Pivotal steps bed to chair) Assistive device: Rolling walker (2 wheeled) Gait Pattern/deviations: Shuffle     General Gait Details: Unsteady, with heavy use of RW for stability   Stairs            Wheelchair Mobility    Modified Rankin (Stroke Patients Only)       Balance     Sitting balance-Leahy Scale: Good       Standing balance-Leahy Scale: Poor                      Cognition Arousal/Alertness: Awake/alert Behavior During Therapy: WFL for tasks assessed/performed Overall Cognitive Status: Within Functional Limits for tasks assessed       Memory: Decreased recall of precautions              Exercises      General Comments        Pertinent Vitals/Pain Pain Assessment: Faces Faces Pain Scale: Hurts little more Pain Location: Reports leg spasms are bothering hime th emost Pain Descriptors / Indicators: Spasm Pain Intervention(s): Limited activity within patient's tolerance;Monitored during session;Repositioned    Home Living                      Prior Function            PT Goals (current goals can now be found in the care plan section) Acute Rehab PT Goals Patient Stated Goal: increase mobility/independence PT Goal Formulation: With patient Time For Goal Achievement: 08/07/15 Potential to Achieve Goals: Good Progress towards PT goals: Progressing toward goals (slowly-but good session after bout with AMS yesterday)    Frequency  Min 5X/week    PT Plan Current plan remains appropriate    Co-evaluation             End of Session Equipment Utilized During  Treatment: Gait belt Activity Tolerance: Patient tolerated treatment well Patient left: in chair;with call bell/phone within reach;with chair alarm set     Time: [id number] PT Time Calculation (min) (ACUTE ONLY): 18 min  Charges:  $Therapeutic Activity: 8-22 mins                    G Codes:      Quin Hoop 08/04/2015, 9:51 AM  Roney Marion, [place] Pager [phone] Office [phone]

## 2015-08-05 ENCOUNTER — Inpatient Hospital Stay (HOSPITAL_COMMUNITY): Payer: PPO

## 2015-08-05 LAB — CBC
HCT: 31.2 % — ABNORMAL LOW (ref 39.0–52.0)
Hemoglobin: 10 g/dL — ABNORMAL LOW (ref 13.0–17.0)
MCH: 29.8 pg (ref 26.0–34.0)
MCHC: 32.1 g/dL (ref 30.0–36.0)
MCV: 92.9 fL (ref 78.0–100.0)
Platelets: 256 10*3/uL (ref 150–400)
RBC: 3.36 MIL/uL — ABNORMAL LOW (ref 4.22–5.81)
RDW: 14.7 % (ref 11.5–15.5)
WBC: 19.1 10*3/uL — ABNORMAL HIGH (ref 4.0–10.5)

## 2015-08-05 LAB — BASIC METABOLIC PANEL
Anion gap: 8 (ref 5–15)
BUN: 42 mg/dL — ABNORMAL HIGH (ref 6–20)
CO2: 28 mmol/L (ref 22–32)
Calcium: 8.2 mg/dL — ABNORMAL LOW (ref 8.9–10.3)
Chloride: 100 mmol/L — ABNORMAL LOW (ref 101–111)
Creatinine, Ser: 2.02 mg/dL — ABNORMAL HIGH (ref 0.61–1.24)
GFR calc Af Amer: 35 mL/min — ABNORMAL LOW (ref >60)
GFR calc non Af Amer: 31 mL/min — ABNORMAL LOW (ref >60)
Glucose, Bld: 273 mg/dL — ABNORMAL HIGH (ref 65–99)
Potassium: 3.9 mmol/L (ref 3.5–5.1)
Sodium: 136 mmol/L (ref 135–145)

## 2015-08-05 LAB — GLUCOSE, CAPILLARY
Glucose-Capillary: 283 mg/dL — ABNORMAL HIGH (ref 65–99)
Glucose-Capillary: 234 mg/dL — ABNORMAL HIGH (ref 65–99)
Glucose-Capillary: 126 mg/dL — ABNORMAL HIGH (ref 65–99)
Glucose-Capillary: 144 mg/dL — ABNORMAL HIGH (ref 65–99)
Glucose-Capillary: 128 mg/dL — ABNORMAL HIGH (ref 65–99)

## 2015-08-05 LAB — HEMOGLOBIN A1C
Hgb A1c MFr Bld: 7.4 % — ABNORMAL HIGH (ref 4.8–5.6)
Mean Plasma Glucose: 166 mg/dL

## 2015-08-05 MED ORDER — INSULIN ASPART 100 UNIT/ML ~~LOC~~ SOLN
5.0000 [IU] | Freq: Three times a day (TID) | SUBCUTANEOUS | Status: DC
  Administered 2015-08-05 – 2015-08-09 (×8): 5 [IU] via SUBCUTANEOUS

## 2015-08-05 MED ORDER — INSULIN ASPART PROT & ASPART (70-30 MIX) 100 UNIT/ML ~~LOC~~ SUSP
50.0000 [IU] | Freq: Two times a day (BID) | SUBCUTANEOUS | Status: DC
  Administered 2015-08-05 – 2015-08-06 (×2): 50 [IU] via SUBCUTANEOUS

## 2015-08-05 MED ORDER — FLEET ENEMA 7-19 GM/118ML RE ENEM
1.0000 | ENEMA | Freq: Every day | RECTAL | Status: DC | PRN
  Filled 2015-08-05: qty 1

## 2015-08-05 MED ORDER — INSULIN ASPART PROT & ASPART (70-30 MIX) 100 UNIT/ML ~~LOC~~ SUSP
10.0000 [IU] | Freq: Once | SUBCUTANEOUS | Status: AC
  Administered 2015-08-05: 10 [IU] via SUBCUTANEOUS

## 2015-08-05 MED ORDER — INSULIN ASPART 100 UNIT/ML ~~LOC~~ SOLN: 7.0000 [IU] | Freq: Three times a day (TID) | SUBCUTANEOUS | Status: DC

## 2015-08-05 MED ORDER — PREDNISONE 5 MG PO TABS
7.0000 mg | ORAL_TABLET | Freq: Every day | ORAL | Status: DC
  Administered 2015-08-06 – 2015-08-09 (×4): 7 mg via ORAL
  Filled 2015-08-05 (×4): qty 2

## 2015-08-05 NOTE — Clinical Social Work Note (Signed)
Clinical Social Worker met with patient at bedside in regards to post-acute placement for SNF. CSW presented bed offers and explained that SNF will likely be covered by [employer] ([employer]) and not [facility] (not sure if patient understands).  Patient gave CSW permission to contact pt's dtr, Hilda Blades. Pt's dtr, Hilda Blades contacted CSW in regards to insurance and confirmed that pt does have H[employer]. Pt and family interested in [facility]. CSW has contacted facility representative who has reported that currently she does not have a Medicare bed available. CSW shared information with patient's dtr who stated she plans to contact [facility] as patient is a former employee and follow up with CSW later today.  No further concerns reported by family at this time.   CSW remains available as needed.   Glendon Axe, MSW, [place] [phone] 08/05/2015 12:18 PM

## 2015-08-05 NOTE — Progress Notes (Signed)
Physical Therapy Treatment Patient Details Name: Jacob [name] MRN: [id number] DOB: August 21, 1940 Today's Date: 08/05/2015    History of Present Illness ANDREAUS TEZAK 75 y.o. male with severe pain and weakness bilaterally lower extremities. MRI shows severe lumbar stenosis and a large HNP at L3/4 bilaterally. Pt underwent L3/4 bilateral lumbar laminectomy on 5/16. Bout with AMS, fever 5/20; Noted pna on chest x-ray    PT Comments    Pt able to tolerate ambulation this date. Pt con't to have bilat LE weakness and is at increased risk of falling. PT to follow acutely to progress ambulation tolerance.  Follow Up Recommendations  SNF;Supervision/Assistance - 24 hour     Equipment Recommendations  None recommended by PT    Recommendations for Other Services       Precautions / Restrictions Precautions Precautions: Fall;Back Precaution Booklet Issued: Yes (comment) Precaution Comments: able to recall 2/3, re-educated pt  Restrictions Weight Bearing Restrictions: No    Mobility  Bed Mobility Overal bed mobility: Needs Assistance Bed Mobility: Rolling;Sidelying to Sit;Sit to Supine Rolling: Min assist Sidelying to sit: Min assist;Mod assist   Sit to supine: Mod assist   General bed mobility comments: assist for trunk elevation and LE management back into bed but pt able to initiate transfer and assist  Transfers Overall transfer level: Needs assistance Equipment used: Rolling walker (2 wheeled) Transfers: Sit to/from Stand Sit to Stand: Min assist         General transfer comment: v/c's to push up from bed  Ambulation/Gait Ambulation/Gait assistance: Min assist;+2 safety/equipment (with close chair follow) Ambulation Distance (Feet): 30 Feet (x2) Assistive device: Rolling walker (2 wheeled) Gait Pattern/deviations: Step-through pattern;Decreased stride length Gait velocity: dec Gait velocity interpretation: Below normal speed for age/gender General Gait Details:  unsteady, L LE weakness/instability, pt constantly stating "i just feel like they're going to give out"   Stairs            Wheelchair Mobility    Modified Rankin (Stroke Patients Only)       Balance Overall balance assessment: Needs assistance Sitting-balance support: Feet supported Sitting balance-Leahy Scale: Good     Standing balance support: Bilateral upper extremity supported Standing balance-Leahy Scale: Poor Standing balance comment: relies heavily on RW for support                    Cognition Arousal/Alertness: Awake/alert Behavior During Therapy: WFL for tasks assessed/performed Overall Cognitive Status: Within Functional Limits for tasks assessed                      Exercises General Exercises - Lower Extremity Ankle Circles/Pumps: AROM;Both;10 reps;Seated Long Arc Quad: AROM;Both;Seated;20 reps Hip Flexion/Marching: AROM;10 reps;Seated;Left    General Comments        Pertinent Vitals/Pain Pain Assessment: 0-10 Pain Score: 2  Pain Location: L LE "feels like a toothache" from hip to knee Pain Intervention(s): Monitored during session    Home Living                      Prior Function            PT Goals (current goals can now be found in the care plan section) Acute Rehab PT Goals Patient Stated Goal: "get my bowels going" Progress towards PT goals: Progressing toward goals    Frequency  Min 5X/week    PT Plan Current plan remains appropriate    Co-evaluation  End of Session Equipment Utilized During Treatment: Gait belt Activity Tolerance: Patient tolerated treatment well Patient left: in bed;with call bell/phone within reach;with bed alarm set;with nursing/sitter in room     Time: 1416-1445 PT Time Calculation (min) (ACUTE ONLY): 29 min  Charges:  $Gait Training: 8-22 mins $Therapeutic Exercise: 8-22 mins                    G Codes:      Kingsley Callander 08/05/2015, 3:10 PM    Kittie Plater, PT, DPT Pager #: [phone] Office #: [phone]

## 2015-08-05 NOTE — Care Management Note (Signed)
Case Management Note  Patient Details  Name: Jacob [name] MRN: [name] Date of Birth: October 17, 1940  Subjective/Objective:                    Action/Plan: Patient with HCAP on IV antibiotics. CM following for d/c needs.   Expected Discharge Date:                  Expected Discharge Plan:     In-House Referral:     Discharge planning Services     Post Acute Care Choice:    Choice offered to:     DME Arranged:    DME Agency:     HH Arranged:    HH Agency:     Status of Service:  In process, will continue to follow  Medicare Important Message Given:    Date Medicare IM Given:    Medicare IM give by:    Date Additional Medicare IM Given:    Additional Medicare Important Message give by:     If discussed at [place] of Stay Meetings, dates discussed:    Additional Comments:  Pollie Friar, RN 08/05/2015, 10:33 AM

## 2015-08-05 NOTE — Evaluation (Signed)
Clinical/Bedside Swallow Evaluation Patient Details  Name: ZIKORA DANKERS MRN: [name] Date of Birth: Sep 18, 1940  Today's Date: 08/05/2015 Time: SLP Start Time (ACUTE ONLY): 0940 SLP Stop Time (ACUTE ONLY): 0951 SLP Time Calculation (min) (ACUTE ONLY): 11 min  Past Medical History:  Past Medical History  Diagnosis Date  . Allergy   . Asthma   . Diabetes mellitus without complication ([place])   . Cataract   . Arthritis   . Polymyalgia rheumatica (HCC)     maintained on Prednisone, Plaquenil. Followed by rhuematology every 4 months/[name].  Marland Kitchen COPD (chronic obstructive pulmonary disease) ([place])   . Anemia   . Hypertension   . BPH (benign prostatic hyperplasia)   . Sleep apnea     CPAP   Trying to use  . Shortness of breath dyspnea     with exertion  . Anxiety   . Chronic kidney disease     chronic  kidney failure  kidney function at 42%  . Coronary artery disease     CABG  7 bypasses  . Hepatitis     many years ago  . Cancer of kidney [facility])    Past Surgical History:  Past Surgical History  Procedure Laterality Date  . Appendectomy    . Hernia repair    . Prostate surgery      TURP at [facility].  Marland Kitchen Coronary artery bypass graft  03/17/1995    Wynonia Lawman; followed every six months.  . Cataract extraction w/phaco Right 11/28/2014    Procedure: CATARACT EXTRACTION PHACO AND INTRAOCULAR LENS PLACEMENT (IOC) RIGHT ;  Surgeon: Marylynn Pearson, MD;  Location: [place];  Service: Ophthalmology;  Laterality: Right;  . Lumbar laminectomy/decompression microdiscectomy N/A 07/30/2015    Procedure: LUMBAR LAMINECTOMY DISCECTOMY ;  Surgeon: Ashok Pall, MD;  Location: [place] [facility];  Service: Neurosurgery;  Laterality: N/A;  LUMBAR LAMINECTOMY DISCECTOMY    HPI:  75 y.o. male with severe pain and weakness bilaterally lower extremities. MRI shows severe lumbar stenosis and a large HNP at L3/4 bilaterally. Pt underwent L3/4 bilateral lumbar laminectomy on 5/16. Bout with AMS, fever 5/20. PMH: DM, polymyalgia  rheumatica, COPD and GERD. Noted pna on chest x-ray and ST consulted.   Assessment / Plan / Recommendation Clinical Impression  Pt reports having a barium esophagram several months back at the [place] and found "esophagus is small". Both oral and pharyngeal phases of swallow are within functional limits. Suspect primary esophageal dysphagia. Explained mildly increased aspiration risk with pt's having COPD. Educated re: reflux precautions. He reports plans for second esophagram before this admission "because the doctor never followed up from the first test." No further ST needed.     Aspiration Risk   (mild-mod)    Diet Recommendation Regular;Thin liquid   Liquid Administration via: Cup;Straw Medication Administration: Whole meds with liquid Supervision: Patient able to self feed Compensations: Slow rate;Small sips/bites Postural Changes: Seated upright at 90 degrees;Remain upright for at least 30 minutes after po intake    Other  Recommendations Oral Care Recommendations: Oral care BID   Follow up Recommendations  None    Frequency and Duration            Prognosis        Swallow Study   General HPI: 75 y.o. male with severe pain and weakness bilaterally lower extremities. MRI shows severe lumbar stenosis and a large HNP at L3/4 bilaterally. Pt underwent L3/4 bilateral lumbar laminectomy on 5/16. Bout with AMS, fever 5/20. PMH: DM, polymyalgia rheumatica, COPD  and GERD. Noted pna on chest x-ray and ST consulted. Type of Study: Bedside Swallow Evaluation Previous Swallow Assessment:  (none) Diet Prior to this Study: Regular;Thin liquids Temperature Spikes Noted: No Respiratory Status: Room air History of Recent Intubation: No Behavior/Cognition: Alert;Cooperative;Pleasant mood Oral Cavity Assessment: Within Functional Limits Oral Care Completed by SLP: No Oral Cavity - Dentition: Dentures, top;Dentures, bottom Vision: Functional for self-feeding Self-Feeding Abilities: Able to  feed self Patient Positioning: Upright in bed Baseline Vocal Quality: Normal Volitional Cough: Strong Volitional Swallow: Able to elicit    Oral/Motor/Sensory Function Overall Oral Motor/Sensory Function: Within functional limits   Ice Chips Ice chips: Not tested   Thin Liquid Thin Liquid: Within functional limits Presentation: Cup;Straw    Nectar Thick Nectar Thick Liquid: Not tested   Honey Thick Honey Thick Liquid: Not tested   Puree Puree: Not tested   Solid   GO   Solid: Within functional limits        Houston Siren 08/05/2015,10:03 AM  Orbie Pyo Colvin Caroli[employer] [phone]

## 2015-08-05 NOTE — Progress Notes (Signed)
Consult PROGRESS NOTE                                                                                                                                                                                                             Patient Demographics:    Jacob [name], is a 75 y.o. male, DOB - Oct 01, 1940, [id number]  Admit date - 07/24/2015   Admitting Physician Ashok Pall, MD  Outpatient Primary MD for the patient is SMITH,KRISTI, MD  LOS - 12  No chief complaint on file.      Brief Narrative  Jacob [name] is a 75 y.o. male,  With history of asthma, type 2 diabetes mellitus on insulin, COPD, obstructive sleep apnea not compliant with CPAP, CAD, EKG stage IV baseline creatinine close to 1.7-1.9, BPH, hypertension, polymyalgia rheumatica on steroids who was admitted to neurosurgery on 07/24/2015 for a L3-4 laminectomy and discectomy by neurosurgery. Hospitalist team was consulted on 08/03/2015 when patient became less responsive with evidence of hypoglycemia and HCAP. Of note cardiology and pulmonary have also seen the patient this admission.   Subjective:    [name] today has, No headache, No chest pain, No abdominal pain - No Nausea, No new weakness tingling or numbness, No Cough - SOB.     Assessment  & Plan :    Hospitalist consult progress note    1. HCAP. Could have aspirated when he was less responsive due to hypoglycemia, continue empiric antibiotics which currently are vancomycin and Zosyn, have stopped Levaquin, Since still has leukocytosis on 08/05/2015 Will check another chest x-ray, however clinically better, leukocytosis could have been due to recent IV steroid exposure.   Continue supportive care with oxygen, nebulizer treatments and flutter valve for pulmonary toiletry. We will have speech therapy evaluate him one time  although this morning mentation is much better and I do not think he has any risk for going aspiration.   2. DM type II with hypoglycemia. Likely due to lack of prednisone, note he is chronically on prednisone along with poor oral intake. Hypoglycemia has resolved and now sugars are on the higher side, he has been replaced on prednisone, have adjusted his 70/30 does along with added pre-meal NovoLog sliding scale.  Lab Results  Component Value Date   HGBA1C 7.4* 08/04/2015   CBG (last 3)   Recent Labs  08/04/15  1622 08/04/15 2212 08/05/15 0653  GLUCAP 291* 252* 283*    3.History of CAD status post CABG. Currently chest pain-free, cardiology following, continue aspirin, statin and beta blocker for secondary prevention. Troponin mildly elevated but flat trend and with non-ACS pattern. Cardiology on board. Likely mild demand ischemia from pneumonia.  4. Polymyalgia rheumatica. Chronically on prednisone which has been resumed, if blood pressure drops will consider stress dose steroids.  5. ARF on CKD stage IV. Baseline creatinine anywhere between 1.7 and 1.9. ARB was discontinued on 08/04/2015,  repeat BMP in the morning.  6. Lumbar discectomy. Defer management to primary team which is neurosurgery.  7. Dyslipidemia. Continue home dose statin.  8. Gout. On home dose allopurinol.  9. BPH with history of urinary retention requiring self cath intermittently. On Flomax. Since his operation he has been on Foley catheter. We'll defer removal of Foley catheter to primary team.      DVT Prophylaxis  : Have ordered SCDs, chemical prophylaxis deferred to primary team which is neurosurgery    Lab Results  Component Value Date   PLT 256 08/05/2015    Inpatient Medications  Scheduled Meds: . allopurinol  300 mg Oral Q lunch  . amLODipine  10 mg Oral Q supper  . aspirin EC  81 mg Oral Daily  . atorvastatin  40 mg Oral q1800  . budesonide (PULMICORT) nebulizer solution  0.25 mg  Nebulization BID  . calcitRIOL  0.25 mcg Oral Q M,W,F  . cycloSPORINE  1 drop Both Eyes BID  . finasteride  5 mg Oral QHS  . gabapentin  300 mg Oral TID  . insulin aspart  0-20 Units Subcutaneous TID WC  . insulin aspart  0-5 Units Subcutaneous QHS  . insulin aspart  5 Units Subcutaneous TID WC  . insulin aspart protamine- aspart  10 Units Subcutaneous Once  . insulin aspart protamine- aspart  50 Units Subcutaneous BID WC  . ipratropium-albuterol  3 mL Nebulization BID  . Liraglutide  1.2 mg Subcutaneous Q1500  . metoprolol  50 mg Oral BID  . montelukast  10 mg Oral QHS  . multivitamin  2 tablet Oral BID  . pilocarpine  1 drop Both Eyes TID  . piperacillin-tazobactam (ZOSYN)  IV  3.375 g Intravenous Q8H  . [START ON 08/06/2015] predniSONE  7 mg Oral Q breakfast  . senna  1 tablet Oral Daily  . sodium phosphate  1 enema Rectal Once  . tamsulosin  0.4 mg Oral QHS  . vancomycin  1,250 mg Intravenous Q24H   Continuous Infusions:  PRN Meds:.acetaminophen **OR** [DISCONTINUED] acetaminophen, bisacodyl, bisacodyl, diazepam, ipratropium-albuterol, [DISCONTINUED] ondansetron **OR** ondansetron (ZOFRAN) IV, oxyCODONE, phenol, senna-docusate, sodium chloride flush, tiZANidine  Antibiotics  :    Anti-infectives    Start     Dose/Rate Route Frequency Ordered Stop   08/04/15 1200  vancomycin (VANCOCIN) 1,250 mg in sodium chloride 0.9 % 250 mL IVPB     1,250 mg 166.7 mL/hr over 90 Minutes Intravenous Every 24 hours 08/03/15 1152     08/03/15 2200  piperacillin-tazobactam (ZOSYN) IVPB 3.375 g     3.375 g 12.5 mL/hr over 240 Minutes Intravenous Every 8 hours 08/03/15 1152     08/03/15 1200  levofloxacin (LEVAQUIN) IVPB 500 mg  Status:  Discontinued     500 mg 100 mL/hr over 60 Minutes Intravenous Every 24 hours 08/03/15 1130 08/04/15 0807   08/03/15 1200  vancomycin (VANCOCIN) 2,000 mg in sodium chloride 0.9 % 500 mL IVPB  2,000 mg 250 mL/hr over 120 Minutes Intravenous  Once 08/03/15 1152  08/03/15 1436   08/03/15 1200  piperacillin-tazobactam (ZOSYN) IVPB 3.375 g     3.375 g 100 mL/hr over 30 Minutes Intravenous  Once 08/03/15 1152 08/03/15 1306         Objective:   Filed Vitals:   08/04/15 2130 08/05/15 0138 08/05/15 0700 08/05/15 0957  BP: 130/66 127/68 144/80 108/61  Pulse: 93 88 90 81  Temp: 98 F (36.7 C) 97.7 F (36.5 C) 98 F (36.7 C) 97.5 F (36.4 C)  TempSrc: Oral Oral Oral Oral  Resp: 18 18 18 18   Height:      Weight:      SpO2: 99% 94% 98% 99%    Wt Readings from Last 3 Encounters:  07/24/15 105.824 kg (233 lb 4.8 oz)  03/27/15 109.317 kg (241 lb)  11/28/14 107.956 kg (238 lb)     Intake/Output Summary (Last 24 hours) at 08/05/15 1116 Last data filed at 08/05/15 0827  Gross per 24 hour  Intake    480 ml  Output    700 ml  Net   -220 ml     Physical Exam  Awake Alert, Oriented X 3, No new F.N deficits, Normal affect Arnold.AT,PERRAL Supple Neck,No JVD, No cervical lymphadenopathy appriciated.  Symmetrical Chest wall movement, Good air movement bilaterally, Few rales RRR,No Gallops,Rubs or new Murmurs, No Parasternal Heave +ve B.Sounds, Abd Soft, No tenderness, No organomegaly appriciated, No rebound - guarding or rigidity. Has a Foley catheter. No Cyanosis, Clubbing or edema, No new Rash or bruise      Data Review:    CBC  Recent Labs Lab 07/30/15 0635 07/30/15 1936 08/03/15 1030 08/04/15 0635 08/05/15 0512  WBC 9.9  --  12.9* 11.1* 19.1*  HGB 12.7* 11.2* 11.6* 10.2* 10.0*  HCT 39.6 33.0* 37.1* 32.3* 31.2*  PLT 190  --  249 241 256  MCV 95.7  --  95.1 92.8 92.9  MCH 30.7  --  29.7 29.3 29.8  MCHC 32.1  --  31.3 31.6 32.1  RDW 15.9*  --  15.3 15.0 14.7  LYMPHSABS 0.9  --  1.0 0.5*  --   MONOABS 0.9  --  0.9 0.1  --   EOSABS 0.3  --  0.3 0.0  --   BASOSABS 0.0  --  0.0 0.0  --     Chemistries   Recent Labs Lab 07/30/15 0635  08/01/15 0927 08/02/15 0439 08/03/15 1030 08/04/15 0635 08/05/15 0512  NA 138  < >  138 142 140 135 136  K 4.5  < > 4.0 3.7 3.9 4.5 3.9  CL 100*  --  102 104 103 97* 100*  CO2 26  --  25 29 28 26 28   GLUCOSE 310*  < > 154* 60* 135* 393* 273*  BUN 26*  --  27* 23* 19 33* 42*  CREATININE 1.63*  --  1.54* 1.51* 1.73* 2.06* 2.02*  CALCIUM 9.0  --  8.5* 8.4* 8.3* 8.0* 8.2*  MG 2.0  --   --   --   --   --   --   AST  --   --   --   --  19 18  --   ALT  --   --   --   --  19 21  --   ALKPHOS  --   --   --   --  82 71  --   BILITOT  --   --   --   --  1.5* 1.6*  --   < > = values in this interval not displayed. ------------------------------------------------------------------------------------------------------------------ No results for input(s): CHOL, HDL, LDLCALC, TRIG, CHOLHDL, LDLDIRECT in the last 72 hours.  Lab Results  Component Value Date   HGBA1C 7.4* 08/04/2015   ------------------------------------------------------------------------------------------------------------------  Recent Labs  08/03/15 1045  TSH 1.200   ------------------------------------------------------------------------------------------------------------------  Recent Labs  08/03/15 1030  VITAMINB12 415    Coagulation profile No results for input(s): INR, PROTIME in the last 168 hours.  No results for input(s): DDIMER in the last 72 hours.  Cardiac Enzymes  Recent Labs Lab 08/03/15 1203 08/03/15 1830 08/04/15 0032  TROPONINI 0.04* 0.42* 0.32*   ------------------------------------------------------------------------------------------------------------------    Component Value Date/Time   BNP 265.8* 08/03/2015 1203    Micro Results Recent Results (from the past 240 hour(s))  Culture, blood (routine x 2)     Status: None (Preliminary result)   Collection Time: 08/03/15 12:05 PM  Result Value Ref Range Status   Specimen Description BLOOD LEFT ANTECUBITAL  Final   Special Requests BOTTLES DRAWN AEROBIC AND ANAEROBIC 10CC  Final   Culture NO GROWTH < 24 HOURS  Final    Report Status PENDING  Incomplete  Culture, blood (routine x 2)     Status: None (Preliminary result)   Collection Time: 08/03/15 12:12 PM  Result Value Ref Range Status   Specimen Description BLOOD RIGHT ANTECUBITAL  Final   Special Requests BOTTLES DRAWN AEROBIC AND ANAEROBIC 10CC  Final   Culture NO GROWTH < 24 HOURS  Final   Report Status PENDING  Incomplete    Radiology Reports X-ray Chest Pa Or Ap  07/30/2015  CLINICAL DATA:  Central line placement EXAM: CHEST 1 VIEW COMPARISON:  07/25/2015 FINDINGS: Postoperative changes in the mediastinum. Placement of a right central venous catheter with tip over the upper SVC region. The catheter demonstrate somewhat lateral orientation but this is likely due to patient rotation. No pneumothorax. Cardiac enlargement without significant vascular congestion. No focal airspace disease or consolidation in the lungs. No blunting of costophrenic angles. Calcified and tortuous aorta. Calcified granuloma in the right lung base. IMPRESSION: Right central venous catheter projects over the upper SVC region. No pneumothorax. Cardiac enlargement. Electronically Signed   By: Lucienne Capers M.D.   On: 07/30/2015 21:57   Dg Lumbar Spine 2-3 Views  07/30/2015  CLINICAL DATA:  Lumbar laminectomy and decompression microdiskectomy L1 level EXAM: LUMBAR SPINE - 2-3 VIEW COMPARISON:  Portable exam 1852 hours labeled as #1 is compared to MR of 07/24/2015 FINDINGS: No numbering system is identified on MR. Current cross-table lateral radiographic exam is therefore labeled assuming 5 lumbar vertebra. This numbering system should be correlated with any numbering system utilized on previous imaging to ensure consistency. Metallic probe via dorsal approach projects dorsal to the L4-L5 disc space at the inferior margin of the L4 spinous process. IMPRESSION: Dorsal localization of the inferior aspect of the spinous process of L4, dorsal to the L4-L5 disc space level. Electronically  Signed   By: Lavonia Dana M.D.   On: 07/30/2015 21:55   Ct Head Wo Contrast  08/03/2015  CLINICAL DATA:  Is altered mental status.  Recent back surgery. EXAM: CT HEAD WITHOUT CONTRAST TECHNIQUE: Contiguous axial images were obtained from the base of the skull through the vertex without intravenous contrast. COMPARISON:  None. FINDINGS: Ventricles are normal in size, for this patient's age, and normal in configuration. There are no parenchymal masses or mass effect. There is no evidence of a  cortical infarct. Mild periventricular white matter hypoattenuation is noted consistent with chronic microvascular ischemic change. Small focal hypoattenuation area in the left base a ganglia suggests old lacune infarct. There are no extra-axial masses or abnormal fluid collections. There is no intracranial hemorrhage. Visualized sinuses and mastoid air cells are clear. IMPRESSION: 1. No acute intracranial abnormalities. 2. Age related volume loss. Mild chronic microvascular ischemic change. Electronically Signed   By: Lajean Manes M.D.   On: 08/03/2015 15:16   Ct Chest High Resolution  07/28/2015  CLINICAL DATA:  75 year old male with history of sarcoidosis and COPD. History of asthma. EXAM: CT CHEST WITHOUT CONTRAST TECHNIQUE: Multidetector CT imaging of the chest was performed following the standard protocol without intravenous contrast. High resolution imaging of the lungs, as well as inspiratory and expiratory imaging, was performed. COMPARISON:  Chest CT 06/20/2010. FINDINGS: Mediastinum/Lymph Nodes: Heart size is mildly enlarged. There is no significant pericardial fluid, thickening or pericardial calcification. There is atherosclerosis of the thoracic aorta, the great vessels of the mediastinum and the coronary arteries, including calcified atherosclerotic plaque in the left main, left anterior descending, left circumflex and right coronary arteries. Status post median sternotomy for CABG, including LIMA to the LAD.  No pathologically enlarged mediastinal or hilar lymph nodes. Please note that accurate exclusion of hilar adenopathy is limited on noncontrast CT scans. Esophagus is unremarkable in appearance. No axillary lymphadenopathy. Lungs/Pleura: Large calcified granuloma with surrounding tiny calcified granulomas in the base of the right lower lobe. High-resolution images demonstrate linear areas of architectural distortion in the dependent portions of the lower lobes of the lungs bilaterally which are new compared to the prior study. This is associated with some very mild cylindrical bronchiectasis in the basal segments of the lower lobes dependently. There are no other more widespread areas of ground-glass attenuation, subpleural reticulation, traction bronchiectasis or frank honeycombing. Inspiratory and expiratory imaging demonstrates mild air trapping, indicative of small airways disease. No acute consolidative airspace disease. No pleural effusions. No suspicious appearing pulmonary nodules or masses. Upper abdomen: A tiny calcified granuloma in the right lobe of the liver. Atherosclerosis. Musculoskeletal: There are no aggressive appearing lytic or blastic lesions noted in the visualized portions of the skeleton. IMPRESSION: 1. Interval development of what appear to be areas of post infectious or inflammatory scarring in the lower lobes of the lungs bilaterally. This is associated with some very mild cylindrical bronchiectasis in the basal segments of the lower lobes of the lungs as well. 2. The above imaging findings are not typical imaging manifestations of sarcoidosis. Additionally, there is no lymphadenopathy or other typical manifestations of sarcoidosis in the thorax. 3. No evidence to suggest other interstitial lung disease. 4. Mild air trapping, indicative of mild small airways disease. 5. Small calcified granulomas in the right lower lobe and right lobe of the liver again noted. 6. Atherosclerosis, including  left main and 3 vessel coronary artery disease. Status post median sternotomy for CABG, including LIMA to the LAD. Electronically Signed   By: Vinnie Langton M.D.   On: 07/28/2015 07:14   Dg Chest Port 1 View  08/04/2015  CLINICAL DATA:  Shortness of breath EXAM: PORTABLE CHEST 1 VIEW COMPARISON:  Chest radiograph from one day prior. FINDINGS: Right internal jugular central venous catheter terminates in the upper third of the superior vena cava. Sternotomy wires appear aligned and intact. CABG clips overlie the mediastinum. Stable cardiomediastinal silhouette with mild cardiomegaly. No pneumothorax. No pleural effusion. Calcified right basilar granuloma is stable. Mild residual  pulmonary edema, nearly resolved. Mild bibasilar atelectasis. IMPRESSION: 1. Stable mild cardiomegaly with nearly resolved mild residual pulmonary edema. 2. Mild bibasilar atelectasis. Electronically Signed   By: Ilona Sorrel M.D.   On: 08/04/2015 11:53   Dg Chest Port 1 View  08/03/2015  CLINICAL DATA:  Productive cough EXAM: PORTABLE CHEST 1 VIEW COMPARISON:  07/30/2015 FINDINGS: Cardiomegaly with pulmonary vascular congestion. Patchy opacities in the right upper lobe, left lower lobe, and right infrahilar region. Differential considerations include multifocal pneumonia versus asymmetric interstitial edema. No definite pleural effusions.  No pneumothorax. Postsurgical changes related to prior CABG. Right IJ venous catheter terminates in the mid SVC. Median sternotomy. IMPRESSION: Cardiomegaly with pulmonary vascular congestion. Multifocal patchy opacities, suspicious for multifocal pneumonia, less likely asymmetric interstitial edema. Electronically Signed   By: Julian Hy M.D.   On: 08/03/2015 11:13   Dg Chest Port 1 View  07/25/2015  CLINICAL DATA:  Preop respiratory exam for lumbar spine disc herniation. Coronary artery disease, COPD, and chronic kidney disease. EXAM: PORTABLE CHEST 1 VIEW COMPARISON:  03/29/2014  FINDINGS: Mild cardiomegaly stable. No evidence pulmonary infiltrate or edema. No evidence pleural effusion. Tiny calcified granuloma in the right lung base remains stable. Prior CABG again noted. IMPRESSION: Stable mild cardiomegaly.  No active lung disease. Electronically Signed   By: Earle Gell M.D.   On: 07/25/2015 21:55   Mr Outside Films Spine  07/26/2015  This examination belongs to an outside facility and is stored here for comparison purposes only.  Contact the originating outside institution for any associated report or interpretation.   Time Spent in minutes  30   Eavan Gonterman K M.D on 08/05/2015 at 11:16 AM  Between 7am to 7pm - Pager - [phone]  After 7pm go to [email or web address] - password [facility]  [facility] -  Office  [phone]

## 2015-08-05 NOTE — Progress Notes (Signed)
Patient ID: Jacob [name], male   DOB: April 11, 1940, 75 y.o.   MRN: [name] BP 108/61 mmHg  Pulse 81  Temp(Src) 97.5 F (36.4 C) (Oral)  Resp 18  Ht 5\' 11"  (1.803 m)  Wt 105.824 kg (233 lb 4.8 oz)  BMI 32.55 kg/m2  SpO2 99% Doing better today, O2 sats improved Moving all extremities CBG (last 3)   Recent Labs  08/04/15 1622 08/04/15 2212 08/05/15 0653  GLUCAP 291* 252* 283*    cbg's running high, but preferable to hypoglycemia. Neurologically stable, wound is clean, no signs of infection.

## 2015-08-05 NOTE — Progress Notes (Signed)
Inpatient Diabetes Program Recommendations  AACE/ADA: New Consensus Statement on Inpatient Glycemic Control (2015)  Target Ranges:  Prepandial:   less than 140 mg/dL      Peak postprandial:   less than 180 mg/dL (1-2 hours)      Critically ill patients:  140 - 180 mg/dL   Results for Jacob [name], Jacob [name] (MRN [name]) as of 08/05/2015 11:43  Ref. Range 08/04/2015 06:34 08/04/2015 11:23 08/04/2015 16:22 08/04/2015 22:12  Glucose-Capillary Latest Ref Range: 65-99 mg/dL 375 (H) 344 (H) 291 (H) 252 (H)   Results for Jacob [name], Jacob [name] (MRN [name]) as of 08/05/2015 11:43  Ref. Range 08/05/2015 06:53  Glucose-Capillary Latest Ref Range: 65-99 mg/dL 283 (H)    Home DM Meds: 70/30 insulin- 75 units bidwc       Victoza 1.2 mg daily  Current Insulin Orders: 70/30 insulin- 50 units bidwc      Novolog Resistant Correction Scale/ SSI (0-20 units) TID AC + HS      Novolog 5 units tidwc      Victoza 1.2 mg daily      -Note patient with consistently elevated glucose levels.  Last dose IV Solumedrol given yesterday at ~3am.  Patient now getting Prednisone 7 mg daily.  -Ate 100% of breakfast this AM.  -Also note 70/30 insulin increased this AM (will get increased dose tonight at supper) and Novolog 5 units tidwc (meal coverage) also started today.     MD- Please consider discontinuation of Novolog 5 units tidwc (meal coverage) as meal coverage not recommended to be given concurrently with 70/30 insulin.    Recommend further upward adjustment of 70/30 insulin instead with concurrent use of Novolog Correction Scale/ SSI to help determine how much more 70/30 insulin patient will need.      --Will follow patient during hospitalization--  Wyn Quaker RN, MSN, CDE Diabetes Coordinator Inpatient Glycemic Control Team Team Pager: [phone] (8a-5p)

## 2015-08-05 NOTE — Progress Notes (Signed)
Subjective:  No complaints of shortness of breath or chest pain.  Complains of pain involving his right upper thigh  Objective:  Vital Signs in the last 24 hours: BP 144/80 mmHg  Pulse 90  Temp(Src) 98 F (36.7 C) (Oral)  Resp 18  Ht 5\' 11"  (1.803 m)  Wt 105.824 kg (233 lb 4.8 oz)  BMI 32.55 kg/m2  SpO2 98%  Physical Exam: Pleasant WM in NAD, currently oriented Lungs:  Clear Cardiac:  Regular rhythm, normal S1 and S2, no S3 Extremities:  No edema present  Intake/Output from previous day: 05/21 0701 - 05/22 0700 In: -  Out: 700 [Urine:700]  Weight Filed Weights   07/24/15 2217  Weight: 105.824 kg (233 lb 4.8 oz)    Lab Results: Basic Metabolic Panel:  Recent Labs  08/04/15 0635 08/05/15 0512  NA 135 136  K 4.5 3.9  CL 97* 100*  CO2 26 28  GLUCOSE 393* 273*  BUN 33* 42*  CREATININE 2.06* 2.02*   CBC:  Recent Labs  08/03/15 1030 08/04/15 0635 08/05/15 0512  WBC 12.9* 11.1* 19.1*  NEUTROABS 10.7* 10.5*  --   HGB 11.6* 10.2* 10.0*  HCT 37.1* 32.3* 31.2*  MCV 95.1 92.8 92.9  PLT 249 241 256    Assessment/Plan: 1. Demand ischemia-his previous tachycardia and ST depression has resolved on his recent EKG 2. CAD with prior CABG 3.  Acute on chronic renal failure 4.  Aspiration pneumonia 5.  Restarted on stress doses of steroids and will cut back to his home dose now  Rec:  Continue rehabilitation and treatment for pneumonia.  Reduce prednisone to home dose.   Kerry Hough  MD [facility]  08/05/2015, 8:44 AM

## 2015-08-05 NOTE — Progress Notes (Signed)
Small bowel movement from suppository this morning; administered fleet enema with moderate results this afternoon; patient passing gas now and stool was hard formed; continue with treatment for constipation.

## 2015-08-05 NOTE — Clinical Social Work Note (Signed)
Patient is not medically stable for d/c. CSW to present bed offers for SNF placement. Patient has reported that [employer] is primary insurance. Per [facility], patient is not service connected.   CSW remains available as needed.  Glendon Axe, MSW, LCSWA [phone] 08/05/2015 11:35 AM

## 2015-08-06 LAB — BASIC METABOLIC PANEL
Anion gap: 6 (ref 5–15)
BUN: 37 mg/dL — ABNORMAL HIGH (ref 6–20)
CO2: 30 mmol/L (ref 22–32)
Calcium: 8.6 mg/dL — ABNORMAL LOW (ref 8.9–10.3)
Chloride: 104 mmol/L (ref 101–111)
Creatinine, Ser: 1.65 mg/dL — ABNORMAL HIGH (ref 0.61–1.24)
GFR calc Af Amer: 45 mL/min — ABNORMAL LOW (ref >60)
GFR calc non Af Amer: 39 mL/min — ABNORMAL LOW (ref >60)
Glucose, Bld: 106 mg/dL — ABNORMAL HIGH (ref 65–99)
Potassium: 3.6 mmol/L (ref 3.5–5.1)
Sodium: 140 mmol/L (ref 135–145)

## 2015-08-06 LAB — CBC
HCT: 29.9 % — ABNORMAL LOW (ref 39.0–52.0)
Hemoglobin: 9.5 g/dL — ABNORMAL LOW (ref 13.0–17.0)
MCH: 29.5 pg (ref 26.0–34.0)
MCHC: 31.8 g/dL (ref 30.0–36.0)
MCV: 92.9 fL (ref 78.0–100.0)
Platelets: 292 10*3/uL (ref 150–400)
RBC: 3.22 MIL/uL — ABNORMAL LOW (ref 4.22–5.81)
RDW: 14.8 % (ref 11.5–15.5)
WBC: 15.8 10*3/uL — ABNORMAL HIGH (ref 4.0–10.5)

## 2015-08-06 LAB — GLUCOSE, CAPILLARY
Glucose-Capillary: 108 mg/dL — ABNORMAL HIGH (ref 65–99)
Glucose-Capillary: 169 mg/dL — ABNORMAL HIGH (ref 65–99)
Glucose-Capillary: 148 mg/dL — ABNORMAL HIGH (ref 65–99)
Glucose-Capillary: 46 mg/dL — ABNORMAL LOW (ref 65–99)
Glucose-Capillary: 49 mg/dL — ABNORMAL LOW (ref 65–99)
Glucose-Capillary: 60 mg/dL — ABNORMAL LOW (ref 65–99)
Glucose-Capillary: 76 mg/dL (ref 65–99)

## 2015-08-06 MED ORDER — DOCUSATE SODIUM 100 MG PO CAPS
200.0000 mg | ORAL_CAPSULE | Freq: Two times a day (BID) | ORAL | Status: DC
  Administered 2015-08-06 – 2015-08-09 (×7): 200 mg via ORAL
  Filled 2015-08-06 (×7): qty 2

## 2015-08-06 MED ORDER — POLYETHYLENE GLYCOL 3350 17 G PO PACK
17.0000 g | PACK | Freq: Two times a day (BID) | ORAL | Status: DC
  Administered 2015-08-06 – 2015-08-08 (×6): 17 g via ORAL
  Filled 2015-08-06 (×7): qty 1

## 2015-08-06 MED ORDER — AMOXICILLIN-POT CLAVULANATE 500-125 MG PO TABS
1.0000 | ORAL_TABLET | Freq: Two times a day (BID) | ORAL | Status: DC
  Administered 2015-08-06 – 2015-08-09 (×7): 500 mg via ORAL
  Filled 2015-08-06 (×7): qty 1

## 2015-08-06 MED ORDER — BISACODYL 10 MG RE SUPP
10.0000 mg | Freq: Every day | RECTAL | Status: DC
  Administered 2015-08-07: 10 mg via RECTAL
  Filled 2015-08-06 (×2): qty 1

## 2015-08-06 NOTE — Consult Note (Signed)
   [name]   08/06/2015  Jacob [name] 14-Sep-1940 [name]  Patient screened for potential [employer] [facility] Management services. Initially the patient was listed as Jacob [name] for benefits.  Patient is eligible for [place] with his [facility] plan. Patient is a s/p Lumbar Laminectomy with HCAP. This is the patient's first admission in 6 months.  Electronic medical record reveals patient's discharge plan is to a skilled nursing facility for rehab and medications.  Currently,  there were no identifiable [facility] care management needs at this time for community as the patient will be going to rehab.  If patient's post hospital needs change or disposition changes with community needs, please place a [facility] Care Management consult. For questions please contact:   Jacob Brood, RN BSN [facility] Liaison  [phone] business mobile phone Toll free office [phone]

## 2015-08-06 NOTE — Progress Notes (Signed)
Patient is prone to hypoglycemia which he is not symptomatic but does state that when he is symptomatic it is severe, may consider a consult with a diabetic coordinator to determine if current glycemic control is appropriate.

## 2015-08-06 NOTE — Progress Notes (Signed)
Physical Therapy Treatment Patient Details Name: Jacob [name] MRN: [name] DOB: 05-24-1940 Today's Date: 08/06/2015    History of Present Illness Jacob [name] 75 y.o. male with severe pain and weakness bilaterally lower extremities. MRI shows severe lumbar stenosis and a large HNP at L3/4 bilaterally. Pt underwent L3/4 bilateral lumbar laminectomy on 5/16. Bout with AMS, fever 5/20; Noted pna on chest x-ray    PT Comments    Pt with improved ambulation tolerance but continues to have bilat LE L >R weakness and numbness limiting ambulation tolerance. Pt remains appropriate for SNF upon d/c.  Follow Up Recommendations  SNF;Supervision/Assistance - 24 hour     Equipment Recommendations  None recommended by PT    Recommendations for Other Services       Precautions / Restrictions Precautions Precautions: Fall;Back Precaution Booklet Issued: Yes (comment) Precaution Comments: re-educated pt on precautions, only able to recall 1/3 Restrictions Weight Bearing Restrictions: No    Mobility  Bed Mobility Overal bed mobility: Needs Assistance Bed Mobility: Rolling;Sidelying to Sit Rolling: Supervision Sidelying to sit: Min guard       General bed mobility comments: increased time, v/c's for sequencing, minimal assist for trunk  Transfers Overall transfer level: Needs assistance Equipment used: Rolling walker (2 wheeled) Transfers: Sit to/from Stand Sit to Stand: Min assist         General transfer comment: v/c's to push up from bed  Ambulation/Gait Ambulation/Gait assistance: Min assist;+2 safety/equipment Ambulation Distance (Feet): 30 Feet (x1, 50x1) Assistive device: Rolling walker (2 wheeled) Gait Pattern/deviations: Step-to pattern;Decreased stride length;Shuffle Gait velocity: dec Gait velocity interpretation: Below normal speed for age/gender General Gait Details: bilat knee weakness, pt with increased L LE pain   Stairs            Wheelchair  Mobility    Modified Rankin (Stroke Patients Only)       Balance Overall balance assessment: Needs assistance Sitting-balance support: Feet supported;No upper extremity supported Sitting balance-Leahy Scale: Good     Standing balance support: No upper extremity supported;During functional activity Standing balance-Leahy Scale: Fair Standing balance comment: pt attempted to pull up undwear in standing pt required assist to both complete task and maintain balance                    Cognition Arousal/Alertness: Awake/alert Behavior During Therapy: WFL for tasks assessed/performed Overall Cognitive Status: Within Functional Limits for tasks assessed                      Exercises General Exercises - Lower Extremity Ankle Circles/Pumps: AROM;Both;10 reps;Seated Gluteal Sets: AROM;Both;10 reps;Seated Long Arc Quad: AROM;Both;Seated;20 reps Hip Flexion/Marching: AROM;10 reps;Seated;Left    General Comments        Pertinent Vitals/Pain Pain Assessment: 0-10 Pain Score: 2  Pain Location: L LE Pain Intervention(s): Monitored during session    Home Living                      Prior Function            PT Goals (current goals can now be found in the care plan section) Progress towards PT goals: Progressing toward goals    Frequency  Min 5X/week    PT Plan Current plan remains appropriate    Co-evaluation             End of Session Equipment Utilized During Treatment: Gait belt Activity Tolerance: Patient tolerated treatment well Patient left: in chair;with  call bell/phone within reach;with chair alarm set;with family/visitor present     Time: [id number] PT Time Calculation (min) (ACUTE ONLY): 24 min  Charges:  $Gait Training: 8-22 mins $Therapeutic Exercise: 8-22 mins                    G Codes:      Kingsley Callander 08/06/2015, 2:45 PM   Kittie Plater, PT, DPT Pager #: [phone] Office #: [phone]

## 2015-08-06 NOTE — Progress Notes (Signed)
Patient ID: Jacob [name], male   DOB: 06/05/1940, 75 y.o.   MRN: [name] BP 160/73 mmHg  Pulse 81  Temp(Src) 98 F (36.7 C) (Oral)  Resp 18  Ht 5\' 11"  (1.803 m)  Wt 105.824 kg (233 lb 4.8 oz)  BMI 32.55 kg/m2  SpO2 95% Alert and oriented x 4 Moving well, much better than last week. Do believe that in 24-48 hours he would be a good candidate for rehab, not snf.  Wound looks good. Continue with PT

## 2015-08-06 NOTE — Progress Notes (Signed)
Subjective:  No complaints of shortness of breath or chest pain.  Plans are to go to rehab for a while.  Renal function improved.  Objective:  Vital Signs in the last 24 hours: BP 134/68 mmHg  Pulse 75  Temp(Src) 98 F (36.7 C) (Oral)  Resp 20  Ht 5\' 11"  (1.803 m)  Wt 105.824 kg (233 lb 4.8 oz)  BMI 32.55 kg/m2  SpO2 95%  Physical Exam: Pleasant WM in NAD, currently oriented Lungs:  Clear Cardiac:  Regular rhythm, normal S1 and S2, no S3 Extremities:  No edema present  Intake/Output from previous day: 05/22 0701 - 05/23 0700 In: 480 [P.O.:480] Out: 1375 [Urine:1375]  Weight Filed Weights   07/24/15 2217  Weight: 105.824 kg (233 lb 4.8 oz)    Lab Results: Basic Metabolic Panel:  Recent Labs  08/05/15 0512 08/06/15 0445  NA 136 140  K 3.9 3.6  CL 100* 104  CO2 28 30  GLUCOSE 273* 106*  BUN 42* 37*  CREATININE 2.02* 1.65*   CBC:  Recent Labs  08/03/15 1030 08/04/15 0635 08/05/15 0512 08/06/15 0445  WBC 12.9* 11.1* 19.1* 15.8*  NEUTROABS 10.7* 10.5*  --   --   HGB 11.6* 10.2* 10.0* 9.5*  HCT 37.1* 32.3* 31.2* 29.9*  MCV 95.1 92.8 92.9 92.9  PLT 249 241 256 292    Assessment/Plan: 1. Demand ischemia-his previous tachycardia and ST depression has resolved on his recent EKG 2. CAD with prior CABG 3.  Acute on chronic renal failure improved today.  4.  Aspiration pneumonia better  Rec:  OK from card viewpoint to go to rehab.  Needs catheter out as he does SIC at home. Needs central line out prior to d/c. Continue prior cardiac meds.    Kerry Hough  MD [facility]  08/06/2015, 8:58 AM

## 2015-08-06 NOTE — Progress Notes (Signed)
Hypoglycemic Event  CBG: 49  Treatment: 28 grams of carbohydrate (5 bite size kit katz), repeated once   Symptoms: Patient is not symptomatic Follow-up CBG: Time: 2130, 2200 CBG Result:60, 76  Possible Reasons for Event: Poor dietary intake, possibly to high of insulin dose  Comments/MD notified: Still hypoglycemic will give a another 28 grams of carbohydrate, patient was ok after that  Azell Der

## 2015-08-06 NOTE — Clinical Social Work Note (Signed)
CSW awaiting bed offer from [facility]. Family aware no bed currently available at [facility]. Family's first preference: [facility], facility rep making bed arrangements.   CSW remains available as needed.   Glendon Axe, MSW, LCSWA [phone] 08/06/2015 4:26 PM

## 2015-08-06 NOTE — Progress Notes (Signed)
Consult PROGRESS NOTE                                                                                                                                                                                                             Patient Demographics:    Jacob [name], is a 75 y.o. male, DOB - 03-23-1940, GR:[id number]  Admit date - 07/24/2015   Admitting Physician Ashok Pall, MD  Outpatient Primary MD for the patient is SMITH,KRISTI, MD  LOS - 13  No chief complaint on file.      Brief Narrative  Jacob [name] is a 75 y.o. male,  With history of asthma, type 2 diabetes mellitus on insulin, COPD, obstructive sleep apnea not compliant with CPAP, CAD, EKG stage IV baseline creatinine close to 1.7-1.9, BPH, hypertension, polymyalgia rheumatica on steroids who was admitted to neurosurgery on 07/24/2015 for a L3-4 laminectomy and discectomy by neurosurgery. Hospitalist team was consulted on 08/03/2015 when patient became less responsive with evidence of hypoglycemia and HCAP. Of note cardiology and pulmonary have also seen the patient this admission.   Subjective:    Jacob [name] today has, No headache, No chest pain, No abdominal pain - No Nausea, No new weakness tingling or numbness, No Cough - SOB.     Assessment  & Plan :    Hospitalist consult progress note    1. HCAP. Could have aspirated when he was less responsive due to hypoglycemia, improved with empiric antibiotics which currently are vancomycin and Zosyn, clinically much better, leukocytosis could have been due to recent IV steroid exposure + infection. Will taper to Oral Augmentin for 4 more days, supportive Rx with Nebs as needed, continue Flutter valve.    2. DM type II with hypoglycemia. Likely due to lack of prednisone, note he is chronically on prednisone along with poor oral  intake. Hypoglycemia has resolved and now sugars are on the higher side, he has been replaced on prednisone, have adjusted his 70/30 does along with added pre-meal NovoLog sliding scale.  Lab Results  Component Value Date   HGBA1C 7.4* 08/04/2015   CBG (last 3)   Recent Labs  08/05/15 1807 08/05/15 2234 08/06/15 0606  GLUCAP 144* 128* 108*    3.History of CAD status post CABG. Currently chest pain-free, cardiology following, continue aspirin, statin and beta blocker for secondary prevention. Troponin mildly  elevated but flat trend and with non-ACS pattern. Cardiology on board. Likely mild demand ischemia from pneumonia.  4. Polymyalgia rheumatica. Chronically on prednisone which has been resumed, if blood pressure drops will consider stress dose steroids.  5. ARF on CKD stage IV. Baseline creatinine anywhere between 1.7 and 1.9. ARB was discontinued on 08/04/2015, at baseline now.  6. Lumbar discectomy. Defer management to primary team which is neurosurgery.  7. Dyslipidemia. Continue home dose statin.  8. Gout. On home dose allopurinol.  9. BPH with history of urinary retention requiring self cath intermittently. On Flomax. Since his operation he has been on Foley catheter. We'll defer removal of Foley catheter to primary team.      DVT Prophylaxis  : Have ordered SCDs, chemical prophylaxis deferred to primary team which is neurosurgery    Lab Results  Component Value Date   PLT 292 08/06/2015    Inpatient Medications  Scheduled Meds: . allopurinol  300 mg Oral Q lunch  . amLODipine  10 mg Oral Q supper  . amoxicillin-clavulanate  1 tablet Oral Q8H  . aspirin EC  81 mg Oral Daily  . atorvastatin  40 mg Oral q1800  . bisacodyl  10 mg Rectal Daily  . budesonide (PULMICORT) nebulizer solution  0.25 mg Nebulization BID  . calcitRIOL  0.25 mcg Oral Q M,W,F  . cycloSPORINE  1 drop Both Eyes BID  . docusate sodium  200 mg Oral BID  . finasteride  5 mg Oral QHS  .  gabapentin  300 mg Oral TID  . insulin aspart  0-20 Units Subcutaneous TID WC  . insulin aspart  0-5 Units Subcutaneous QHS  . insulin aspart  5 Units Subcutaneous TID WC  . insulin aspart protamine- aspart  50 Units Subcutaneous BID WC  . ipratropium-albuterol  3 mL Nebulization BID  . Liraglutide  1.2 mg Subcutaneous Q1500  . metoprolol  50 mg Oral BID  . montelukast  10 mg Oral QHS  . multivitamin  2 tablet Oral BID  . pilocarpine  1 drop Both Eyes TID  . polyethylene glycol  17 g Oral BID  . predniSONE  7 mg Oral Q breakfast  . senna  1 tablet Oral Daily  . tamsulosin  0.4 mg Oral QHS   Continuous Infusions:  PRN Meds:.acetaminophen **OR** [DISCONTINUED] acetaminophen, diazepam, ipratropium-albuterol, [DISCONTINUED] ondansetron **OR** ondansetron (ZOFRAN) IV, oxyCODONE, phenol, senna-docusate, sodium chloride flush, sodium phosphate, tiZANidine  Antibiotics  :    Anti-infectives    Start     Dose/Rate Route Frequency Ordered Stop   08/06/15 0945  amoxicillin-clavulanate (AUGMENTIN) 500-125 MG per tablet 500 mg     1 tablet Oral Every 8 hours 08/06/15 0939     08/04/15 1200  vancomycin (VANCOCIN) 1,250 mg in sodium chloride 0.9 % 250 mL IVPB  Status:  Discontinued     1,250 mg 166.7 mL/hr over 90 Minutes Intravenous Every 24 hours 08/03/15 1152 08/06/15 0939   08/03/15 2200  piperacillin-tazobactam (ZOSYN) IVPB 3.375 g  Status:  Discontinued     3.375 g 12.5 mL/hr over 240 Minutes Intravenous Every 8 hours 08/03/15 1152 08/06/15 0939   08/03/15 1200  levofloxacin (LEVAQUIN) IVPB 500 mg  Status:  Discontinued     500 mg 100 mL/hr over 60 Minutes Intravenous Every 24 hours 08/03/15 1130 08/04/15 0807   08/03/15 1200  vancomycin (VANCOCIN) 2,000 mg in sodium chloride 0.9 % 500 mL IVPB     2,000 mg 250 mL/hr over 120 Minutes Intravenous  Once 08/03/15 1152 08/03/15 1436   08/03/15 1200  piperacillin-tazobactam (ZOSYN) IVPB 3.375 g     3.375 g 100 mL/hr over 30 Minutes  Intravenous  Once 08/03/15 1152 08/03/15 1306         Objective:   Filed Vitals:   08/05/15 2143 08/05/15 2152 08/06/15 0103 08/06/15 0511  BP:  132/63 122/67 134/68  Pulse:  85 86 75  Temp:  97.5 F (36.4 C) 97.8 F (36.6 C) 98 F (36.7 C)  TempSrc:  Oral Oral Oral  Resp:  20 20 20   Height:      Weight:      SpO2: 98% 96% 96% 95%    Wt Readings from Last 3 Encounters:  07/24/15 105.824 kg (233 lb 4.8 oz)  03/27/15 109.317 kg (241 lb)  11/28/14 107.956 kg (238 lb)     Intake/Output Summary (Last 24 hours) at 08/06/15 0939 Last data filed at 08/06/15 0825  Gross per 24 hour  Intake    250 ml  Output    725 ml  Net   -475 ml     Physical Exam  Awake Alert, Oriented X 3, No new F.N deficits, Normal affect [place].AT,PERRAL Supple Neck,No JVD, No cervical lymphadenopathy appriciated.  Symmetrical Chest wall movement, Good air movement bilaterally, No rales RRR,No Gallops,Rubs or new Murmurs, No Parasternal Heave, L IJ C Line. +ve B.Sounds, Abd Soft, No tenderness, No organomegaly appriciated, No rebound - guarding or rigidity. Has a Foley catheter. No Cyanosis, Clubbing or edema, No new Rash or bruise      Data Review:    CBC  Recent Labs Lab 07/30/15 1936 08/03/15 1030 08/04/15 0635 08/05/15 0512 08/06/15 0445  WBC  --  12.9* 11.1* 19.1* 15.8*  HGB 11.2* 11.6* 10.2* 10.0* 9.5*  HCT 33.0* 37.1* 32.3* 31.2* 29.9*  PLT  --  249 241 256 292  MCV  --  95.1 92.8 92.9 92.9  MCH  --  29.7 29.3 29.8 29.5  MCHC  --  31.3 31.6 32.1 31.8  RDW  --  15.3 15.0 14.7 14.8  LYMPHSABS  --  1.0 0.5*  --   --   MONOABS  --  0.9 0.1  --   --   EOSABS  --  0.3 0.0  --   --   BASOSABS  --  0.0 0.0  --   --     Chemistries   Recent Labs Lab 08/02/15 0439 08/03/15 1030 08/04/15 0635 08/05/15 0512 08/06/15 0445  NA 142 140 135 136 140  K 3.7 3.9 4.5 3.9 3.6  CL 104 103 97* 100* 104  CO2 29 28 26 28 30   GLUCOSE 60* 135* 393* 273* 106*  BUN 23* 19 33* 42* 37*   CREATININE 1.51* 1.73* 2.06* 2.02* 1.65*  CALCIUM 8.4* 8.3* 8.0* 8.2* 8.6*  AST  --  19 18  --   --   ALT  --  19 21  --   --   ALKPHOS  --  82 71  --   --   BILITOT  --  1.5* 1.6*  --   --    ------------------------------------------------------------------------------------------------------------------ No results for input(s): CHOL, HDL, LDLCALC, TRIG, CHOLHDL, LDLDIRECT in the last 72 hours.  Lab Results  Component Value Date   HGBA1C 7.4* 08/04/2015   ------------------------------------------------------------------------------------------------------------------  Recent Labs  08/03/15 1045  TSH 1.200   ------------------------------------------------------------------------------------------------------------------  Recent Labs  08/03/15 1030  VITAMINB12 415    Coagulation profile No results for input(s):  INR, PROTIME in the last 168 hours.  No results for input(s): DDIMER in the last 72 hours.  Cardiac Enzymes  Recent Labs Lab 08/03/15 1203 08/03/15 1830 08/04/15 0032  TROPONINI 0.04* 0.42* 0.32*   ------------------------------------------------------------------------------------------------------------------    Component Value Date/Time   BNP 265.8* 08/03/2015 1203    Micro Results Recent Results (from the past 240 hour(s))  Culture, blood (routine x 2)     Status: None (Preliminary result)   Collection Time: 08/03/15 12:05 PM  Result Value Ref Range Status   Specimen Description BLOOD LEFT ANTECUBITAL  Final   Special Requests BOTTLES DRAWN AEROBIC AND ANAEROBIC 10CC  Final   Culture NO GROWTH 2 DAYS  Final   Report Status PENDING  Incomplete  Culture, blood (routine x 2)     Status: None (Preliminary result)   Collection Time: 08/03/15 12:12 PM  Result Value Ref Range Status   Specimen Description BLOOD RIGHT ANTECUBITAL  Final   Special Requests BOTTLES DRAWN AEROBIC AND ANAEROBIC 10CC  Final   Culture NO GROWTH 2 DAYS  Final   Report  Status PENDING  Incomplete    Radiology Reports X-ray Chest Pa Or Ap  07/30/2015  CLINICAL DATA:  Central line placement EXAM: CHEST 1 VIEW COMPARISON:  07/25/2015 FINDINGS: Postoperative changes in the mediastinum. Placement of a right central venous catheter with tip over the upper SVC region. The catheter demonstrate somewhat lateral orientation but this is likely due to patient rotation. No pneumothorax. Cardiac enlargement without significant vascular congestion. No focal airspace disease or consolidation in the lungs. No blunting of costophrenic angles. Calcified and tortuous aorta. Calcified granuloma in the right lung base. IMPRESSION: Right central venous catheter projects over the upper SVC region. No pneumothorax. Cardiac enlargement. Electronically Signed   By: Lucienne Capers M.D.   On: 07/30/2015 21:57   Dg Chest 2 View  08/05/2015  CLINICAL DATA:  Community acquired pneumonia EXAM: CHEST  2 VIEW COMPARISON:  08/04/2015 FINDINGS: Cardiomegaly again noted. Right IJ central line is unchanged in position. Status post median sternotomy. There is patchy residual right perihilar asymmetric edema or infiltrate. Bony thorax is stable. Degenerative changes mid and lower thoracic spine. IMPRESSION: Right perihilar residual patchy asymmetric edema or infiltrate. No pulmonary edema. Status post CABG. Stable right IJ central line position PICC Electronically Signed   By: Lahoma Crocker M.D.   On: 08/05/2015 13:15   Dg Lumbar Spine 2-3 Views  07/30/2015  CLINICAL DATA:  Lumbar laminectomy and decompression microdiskectomy L1 level EXAM: LUMBAR SPINE - 2-3 VIEW COMPARISON:  Portable exam 1852 hours labeled as #1 is compared to MR of 07/24/2015 FINDINGS: No numbering system is identified on MR. Current cross-table lateral radiographic exam is therefore labeled assuming 5 lumbar vertebra. This numbering system should be correlated with any numbering system utilized on previous imaging to ensure consistency.  Metallic probe via dorsal approach projects dorsal to the L4-L5 disc space at the inferior margin of the L4 spinous process. IMPRESSION: Dorsal localization of the inferior aspect of the spinous process of L4, dorsal to the L4-L5 disc space level. Electronically Signed   By: Lavonia Dana M.D.   On: 07/30/2015 21:55   Ct Head Wo Contrast  08/03/2015  CLINICAL DATA:  Is altered mental status.  Recent back surgery. EXAM: CT HEAD WITHOUT CONTRAST TECHNIQUE: Contiguous axial images were obtained from the base of the skull through the vertex without intravenous contrast. COMPARISON:  None. FINDINGS: Ventricles are normal in size, for this patient's age,  and normal in configuration. There are no parenchymal masses or mass effect. There is no evidence of a cortical infarct. Mild periventricular white matter hypoattenuation is noted consistent with chronic microvascular ischemic change. Small focal hypoattenuation area in the left base a ganglia suggests old lacune infarct. There are no extra-axial masses or abnormal fluid collections. There is no intracranial hemorrhage. Visualized sinuses and mastoid air cells are clear. IMPRESSION: 1. No acute intracranial abnormalities. 2. Age related volume loss. Mild chronic microvascular ischemic change. Electronically Signed   By: Lajean Manes M.D.   On: 08/03/2015 15:16   Ct Chest High Resolution  07/28/2015  CLINICAL DATA:  75 year old male with history of sarcoidosis and COPD. History of asthma. EXAM: CT CHEST WITHOUT CONTRAST TECHNIQUE: Multidetector CT imaging of the chest was performed following the standard protocol without intravenous contrast. High resolution imaging of the lungs, as well as inspiratory and expiratory imaging, was performed. COMPARISON:  Chest CT 06/20/2010. FINDINGS: Mediastinum/Lymph Nodes: Heart size is mildly enlarged. There is no significant pericardial fluid, thickening or pericardial calcification. There is atherosclerosis of the thoracic aorta,  the great vessels of the mediastinum and the coronary arteries, including calcified atherosclerotic plaque in the left main, left anterior descending, left circumflex and right coronary arteries. Status post median sternotomy for CABG, including LIMA to the LAD. No pathologically enlarged mediastinal or hilar lymph nodes. Please note that accurate exclusion of hilar adenopathy is limited on noncontrast CT scans. Esophagus is unremarkable in appearance. No axillary lymphadenopathy. Lungs/Pleura: Large calcified granuloma with surrounding tiny calcified granulomas in the base of the right lower lobe. High-resolution images demonstrate linear areas of architectural distortion in the dependent portions of the lower lobes of the lungs bilaterally which are new compared to the prior study. This is associated with some very mild cylindrical bronchiectasis in the basal segments of the lower lobes dependently. There are no other more widespread areas of ground-glass attenuation, subpleural reticulation, traction bronchiectasis or frank honeycombing. Inspiratory and expiratory imaging demonstrates mild air trapping, indicative of small airways disease. No acute consolidative airspace disease. No pleural effusions. No suspicious appearing pulmonary nodules or masses. Upper abdomen: A tiny calcified granuloma in the right lobe of the liver. Atherosclerosis. Musculoskeletal: There are no aggressive appearing lytic or blastic lesions noted in the visualized portions of the skeleton. IMPRESSION: 1. Interval development of what appear to be areas of post infectious or inflammatory scarring in the lower lobes of the lungs bilaterally. This is associated with some very mild cylindrical bronchiectasis in the basal segments of the lower lobes of the lungs as well. 2. The above imaging findings are not typical imaging manifestations of sarcoidosis. Additionally, there is no lymphadenopathy or other typical manifestations of sarcoidosis  in the thorax. 3. No evidence to suggest other interstitial lung disease. 4. Mild air trapping, indicative of mild small airways disease. 5. Small calcified granulomas in the right lower lobe and right lobe of the liver again noted. 6. Atherosclerosis, including left main and 3 vessel coronary artery disease. Status post median sternotomy for CABG, including LIMA to the LAD. Electronically Signed   By: Vinnie Langton M.D.   On: 07/28/2015 07:14   Dg Chest Port 1 View  08/04/2015  CLINICAL DATA:  Shortness of breath EXAM: PORTABLE CHEST 1 VIEW COMPARISON:  Chest radiograph from one day prior. FINDINGS: Right internal jugular central venous catheter terminates in the upper third of the superior vena cava. Sternotomy wires appear aligned and intact. CABG clips overlie the mediastinum. Stable  cardiomediastinal silhouette with mild cardiomegaly. No pneumothorax. No pleural effusion. Calcified right basilar granuloma is stable. Mild residual pulmonary edema, nearly resolved. Mild bibasilar atelectasis. IMPRESSION: 1. Stable mild cardiomegaly with nearly resolved mild residual pulmonary edema. 2. Mild bibasilar atelectasis. Electronically Signed   By: Ilona Sorrel M.D.   On: 08/04/2015 11:53   Dg Chest Port 1 View  08/03/2015  CLINICAL DATA:  Productive cough EXAM: PORTABLE CHEST 1 VIEW COMPARISON:  07/30/2015 FINDINGS: Cardiomegaly with pulmonary vascular congestion. Patchy opacities in the right upper lobe, left lower lobe, and right infrahilar region. Differential considerations include multifocal pneumonia versus asymmetric interstitial edema. No definite pleural effusions.  No pneumothorax. Postsurgical changes related to prior CABG. Right IJ venous catheter terminates in the mid SVC. Median sternotomy. IMPRESSION: Cardiomegaly with pulmonary vascular congestion. Multifocal patchy opacities, suspicious for multifocal pneumonia, less likely asymmetric interstitial edema. Electronically Signed   By: Julian Hy M.D.   On: 08/03/2015 11:13   Dg Chest Port 1 View  07/25/2015  CLINICAL DATA:  Preop respiratory exam for lumbar spine disc herniation. Coronary artery disease, COPD, and chronic kidney disease. EXAM: PORTABLE CHEST 1 VIEW COMPARISON:  03/29/2014 FINDINGS: Mild cardiomegaly stable. No evidence pulmonary infiltrate or edema. No evidence pleural effusion. Tiny calcified granuloma in the right lung base remains stable. Prior CABG again noted. IMPRESSION: Stable mild cardiomegaly.  No active lung disease. Electronically Signed   By: Earle Gell M.D.   On: 07/25/2015 21:55   Mr Outside Films Spine  07/26/2015  This examination belongs to an outside facility and is stored here for comparison purposes only.  Contact the originating outside institution for any associated report or interpretation.   Time Spent in minutes  30   Lala Lund K M.D on 08/06/2015 at 9:39 AM  Between 7am to 7pm - Pager - [phone]  After 7pm go to [email or web address] - password [facility]  [facility] -  Office  [phone]

## 2015-08-07 DIAGNOSIS — M48061 Spinal stenosis, lumbar region without neurogenic claudication: Secondary | ICD-10-CM | POA: Diagnosis present

## 2015-08-07 DIAGNOSIS — M4806 Spinal stenosis, lumbar region: Principal | ICD-10-CM

## 2015-08-07 DIAGNOSIS — M5416 Radiculopathy, lumbar region: Secondary | ICD-10-CM

## 2015-08-07 DIAGNOSIS — J189 Pneumonia, unspecified organism: Secondary | ICD-10-CM

## 2015-08-07 DIAGNOSIS — R4182 Altered mental status, unspecified: Secondary | ICD-10-CM

## 2015-08-07 HISTORY — DX: Spinal stenosis, lumbar region without neurogenic claudication: M48.061

## 2015-08-07 LAB — BASIC METABOLIC PANEL
Anion gap: 9 (ref 5–15)
BUN: 27 mg/dL — ABNORMAL HIGH (ref 6–20)
CO2: 30 mmol/L (ref 22–32)
Calcium: 8.9 mg/dL (ref 8.9–10.3)
Chloride: 101 mmol/L (ref 101–111)
Creatinine, Ser: 1.56 mg/dL — ABNORMAL HIGH (ref 0.61–1.24)
GFR calc Af Amer: 48 mL/min — ABNORMAL LOW (ref >60)
GFR calc non Af Amer: 42 mL/min — ABNORMAL LOW (ref >60)
Glucose, Bld: 91 mg/dL (ref 65–99)
Potassium: 3.5 mmol/L (ref 3.5–5.1)
Sodium: 140 mmol/L (ref 135–145)

## 2015-08-07 LAB — GLUCOSE, CAPILLARY
Glucose-Capillary: 112 mg/dL — ABNORMAL HIGH (ref 65–99)
Glucose-Capillary: 105 mg/dL — ABNORMAL HIGH (ref 65–99)
Glucose-Capillary: 245 mg/dL — ABNORMAL HIGH (ref 65–99)
Glucose-Capillary: 252 mg/dL — ABNORMAL HIGH (ref 65–99)

## 2015-08-07 MED ORDER — POTASSIUM CHLORIDE CRYS ER 20 MEQ PO TBCR
20.0000 meq | EXTENDED_RELEASE_TABLET | Freq: Once | ORAL | Status: AC
  Administered 2015-08-07: 20 meq via ORAL
  Filled 2015-08-07: qty 1

## 2015-08-07 MED ORDER — PANTOPRAZOLE SODIUM 40 MG PO TBEC
40.0000 mg | DELAYED_RELEASE_TABLET | Freq: Every day | ORAL | Status: DC
  Administered 2015-08-07 – 2015-08-09 (×3): 40 mg via ORAL
  Filled 2015-08-07 (×3): qty 1

## 2015-08-07 MED ORDER — INSULIN ASPART PROT & ASPART (70-30 MIX) 100 UNIT/ML ~~LOC~~ SUSP
10.0000 [IU] | Freq: Two times a day (BID) | SUBCUTANEOUS | Status: DC
  Administered 2015-08-08 – 2015-08-09 (×2): 10 [IU] via SUBCUTANEOUS

## 2015-08-07 MED ORDER — GLUCERNA SHAKE PO LIQD
237.0000 mL | Freq: Three times a day (TID) | ORAL | Status: DC
  Administered 2015-08-07 – 2015-08-09 (×7): 237 mL via ORAL
  Filled 2015-08-07 (×10): qty 237

## 2015-08-07 MED ORDER — ALUM & MAG HYDROXIDE-SIMETH 200-200-20 MG/5ML PO SUSP
15.0000 mL | Freq: Four times a day (QID) | ORAL | Status: DC | PRN
  Administered 2015-08-07: 15 mL via ORAL
  Filled 2015-08-07: qty 30

## 2015-08-07 MED ORDER — GLUCERNA PO LIQD: 237.0000 mL | Freq: Three times a day (TID) | ORAL | Status: DC

## 2015-08-07 MED ORDER — INSULIN ASPART PROT & ASPART (70-30 MIX) 100 UNIT/ML ~~LOC~~ SUSP: 35.0000 [IU] | Freq: Two times a day (BID) | SUBCUTANEOUS | Status: DC

## 2015-08-07 MED ORDER — SUCRALFATE 1 GM/10ML PO SUSP
1.0000 g | Freq: Three times a day (TID) | ORAL | Status: DC
Start: 2015-08-07 — End: 2015-08-09
  Administered 2015-08-07 – 2015-08-09 (×9): 1 g via ORAL
  Filled 2015-08-07 (×8): qty 10

## 2015-08-07 MED ORDER — MAGIC MOUTHWASH
5.0000 mL | Freq: Four times a day (QID) | ORAL | Status: DC
  Administered 2015-08-07 – 2015-08-09 (×9): 5 mL via ORAL
  Filled 2015-08-07 (×4): qty 5

## 2015-08-07 NOTE — Progress Notes (Signed)
Physical Therapy Note    08/07/15 1200  PT Visit Information  Last PT Received On 08/07/15  General Comments  General comments (skin integrity, edema, etc.) Spoke extensively with patient regarding CIR vs [facility]. Pt very upset due to being in 10/10 pain. Educated pt on needing to ask RN for pain medication, pt agreed he waited to long and shouldve taken pain medicine during the night. Family very concerned about pt staying here due to length of commute. Patient reports "I want to go to your rehab because Dr. Christella Noa says its the best"  Discussed expectations of CIR vs SNF. Pt currently in too much pain to attempt ambulation. PT to return this PM to attempt ambulation once pt has had pain medication.   PT Time Calculation  PT Start Time (ACUTE ONLY) 1146  PT Stop Time (ACUTE ONLY) 1212  PT Time Calculation (min) (ACUTE ONLY) 26 min  PT General Charges  $$ ACUTE PT VISIT 1 Procedure  PT Treatments  $Self Care/Home Management 8-22    Kittie Plater, PT, DPT Pager #: [phone] Office #: [phone]

## 2015-08-07 NOTE — Progress Notes (Signed)
Inquired to patient if he had a predisposing activity before pain increased. Per reports, patient has not been asking for pain medication and refuses when offered. Pt states he does not believe that activity caused pain to increase, just that he is "stubborn and won't ask for medication". Will restart IV and request IV pain medications. Wendee Copp

## 2015-08-07 NOTE — Progress Notes (Signed)
Inpatient Diabetes Program Recommendations  AACE/ADA: New Consensus Statement on Inpatient Glycemic Control (2015)  Target Ranges:  Prepandial:   less than 140 mg/dL      Peak postprandial:   less than 180 mg/dL (1-2 hours)      Critically ill patients:  140 - 180 mg/dL   Results for Jacob [name], Jacob [name] (MRN [id number]) as of 08/07/2015 09:24  Ref. Range 08/06/2015 17:03 08/06/2015 20:51 08/06/2015 20:53 08/06/2015 20:58 08/06/2015 21:35 08/06/2015 22:00 08/07/2015 06:32  Glucose-Capillary Latest Ref Range: 65-99 mg/dL 148 (H) 49 (L) 17 (LL) 46 (L) 60 (L) 76 112 (H)   Review of Glycemic Control  Diabetes history: Dm 2 Outpatient Diabetes medications: Victoza 1.2 mg Daily, 70/30 75 units BID Current orders for Inpatient glycemic control: 70/30 50 units BID, Novolog Resistant + HS scale + Novolog 5 units TID.  Inpatient Diabetes Program Recommendations: Insulin - Basal: Patient had hypoglycemia yesterday of 17 mg/dl. Please consider decreasing 70/30 dose to 35 units BID. Noted patient was on IV solumedrol and now PO prednisone dose is decreasing.  Thanks,  Tama Headings RN, MSN, [facility] Inpatient Diabetes Coordinator Team Pager [phone] (8a-5p)

## 2015-08-07 NOTE — Progress Notes (Signed)
Physical Therapy Treatment Patient Details Name: Jacob [name] MRN: [id number] DOB: Mar 31, 1940 Today's Date: 08/07/2015    History of Present Illness ELLINGTON KANEKO 75 y.o. male with severe pain and weakness bilaterally lower extremities. MRI shows severe lumbar stenosis and a large HNP at L3/4 bilaterally. Pt underwent L3/4 bilateral lumbar laminectomy on 5/16. Bout with AMS, fever 5/20; Noted pna on chest x-ray    PT Comments    Pt progressing well towards goals. Remains to have bilat LE weakness and onset of L radiating pain with amb. Spoke extensively regarding d/c plan. Pt desires to go to [facility]. Anticipate pt will need increased time to progress to safe mod I level of function and agree ST-SNF is appropriate. Acute PT to con't to follow.  Follow Up Recommendations  SNF;Supervision/Assistance - 24 hour     Equipment Recommendations  None recommended by PT    Recommendations for Other Services       Precautions / Restrictions Precautions Precautions: Fall;Back Precaution Booklet Issued: Yes (comment) Precaution Comments: re-educated pt on precautions, poor recall Restrictions Weight Bearing Restrictions: No    Mobility  Bed Mobility Overal bed mobility: Needs Assistance           Sit to sidelying: Min assist General bed mobility comments: assist for LEs to adhere to precautions and decrease strain on back  Transfers Overall transfer level: Needs assistance Equipment used: Rolling walker (2 wheeled) Transfers: Sit to/from Stand Sit to Stand: Min guard         General transfer comment: v/c's to push up from bed  Ambulation/Gait Ambulation/Gait assistance: Min assist;+2 safety/equipment Ambulation Distance (Feet): 20 Feet (x1, 40x1, 15x1) Assistive device: Rolling walker (2 wheeled) Gait Pattern/deviations: Step-through pattern (decreased step height) Gait velocity: dec   General Gait Details: bilat knee weakness, pt with increased L LE pain,  required freq seated rest breaks   Stairs            Wheelchair Mobility    Modified Rankin (Stroke Patients Only)       Balance Overall balance assessment: Needs assistance         Standing balance support: During functional activity Standing balance-Leahy Scale: Fair                      Cognition Arousal/Alertness: Awake/alert Behavior During Therapy: WFL for tasks assessed/performed Overall Cognitive Status: Within Functional Limits for tasks assessed                      Exercises      General Comments        Pertinent Vitals/Pain Pain Assessment: 0-10 Pain Score: 3  Pain Location: low back and L LE Pain Intervention(s): Monitored during session    Home Living                      Prior Function            PT Goals (current goals can now be found in the care plan section) Progress towards PT goals: Progressing toward goals    Frequency  Min 5X/week    PT Plan Current plan remains appropriate    Co-evaluation             End of Session Equipment Utilized During Treatment: Gait belt Activity Tolerance: Patient tolerated treatment well Patient left: in bed;with call bell/phone within reach;with bed alarm set     Time: [id number] PT Time Calculation (  min) (ACUTE ONLY): 29 min  Charges:  $Gait Training: 23-37 mins                    G Codes:      Kingsley Callander 08/07/2015, 4:46 PM   Kittie Plater, PT, DPT Pager #: [phone] Office #: [phone]

## 2015-08-07 NOTE — Progress Notes (Signed)
Subjective:  He looks worse this morning and his overall complaining of pain in his back and lower legs.  States he has not been given pain medicine and looks miserable.  No complaints of shortness of breath or chest pain.  Objective:  Vital Signs in the last 24 hours: BP 164/80 mmHg  Pulse 87  Temp(Src) 98.9 F (37.2 C) (Oral)  Resp 20  Ht 5\' 11"  (1.803 m)  Wt 105.824 kg (233 lb 4.8 oz)  BMI 32.55 kg/m2  SpO2 98%  Physical Exam: Pleasant WM in NAD, currently oriented, appears uncomfortable and complaining of pain Lungs:  Clear Cardiac:  Regular rhythm, normal S1 and S2, no S3 Extremities:  No edema present  Intake/Output from previous day: 05/23 0701 - 05/24 0700 In: 250 [P.O.:250] Out: 475 [Urine:475]  Weight Filed Weights   07/24/15 2217  Weight: 105.824 kg (233 lb 4.8 oz)    Lab Results: Basic Metabolic Panel:  Recent Labs  08/06/15 0445 08/07/15 0604  NA 140 140  K 3.6 3.5  CL 104 101  CO2 30 30  GLUCOSE 106* 91  BUN 37* 27*  CREATININE 1.65* 1.56*   CBC:  Recent Labs  08/05/15 0512 08/06/15 0445  WBC 19.1* 15.8*  HGB 10.0* 9.5*  HCT 31.2* 29.9*  MCV 92.9 92.9  PLT 256 292    Assessment/Plan: 1. Demand ischemia-his previous tachycardia and ST depression has resolved on his recent EKG 2. CAD with prior CABG 3.  Acute on chronic renal failure improved today.  4.  Aspiration pneumonia better 5.  Significant postoperative pain not adequately controlled  Rec:  Cardiac status is stable and his renal function is improving.  Telemetry shows him to be in sinus rhythm.  Needs adequate pain control and rehabilitation.  Kerry Hough  MD [facility] Cardiology  08/07/2015, 9:13 AM

## 2015-08-07 NOTE — Progress Notes (Signed)
CSW will continue to follow for possible SNF transfer when stable.  Per MD note hopeful for CIR placement if pt can tolerate.  Domenica Reamer, [place] Social Worker [phone]

## 2015-08-07 NOTE — Progress Notes (Signed)
Consult PROGRESS NOTE                                                                                                                                                                                                             Patient Demographics:    Jacob [name], is a 75 y.o. male, DOB - Jul 27, 1940, [id number]  Admit date - 07/24/2015   Admitting Physician Ashok Pall, MD  Outpatient Primary MD for the patient is SMITH,KRISTI, MD  LOS - 14  No chief complaint on file.      Brief Narrative  Jacob [name] is a 75 y.o. male,  With history of asthma, type 2 diabetes mellitus on insulin, COPD, obstructive sleep apnea not compliant with CPAP, CAD, EKG stage IV baseline creatinine close to 1.7-1.9, BPH, hypertension, polymyalgia rheumatica on steroids who was admitted to neurosurgery on 07/24/2015 for a L3-4 laminectomy and discectomy by neurosurgery. Hospitalist team was consulted on 08/03/2015 when patient became less responsive with evidence of hypoglycemia and HCAP. Of note cardiology and pulmonary have also seen the patient this admission.   Subjective:    Jacob [name] today has, No headache, No chest pain, No abdominal pain - No Nausea, No new weakness tingling or numbness, No Cough - SOB, Had an episode of hypoglycemia yesterday evening.   Assessment  & Plan :    Hospitalist consult progress note    1. HCAP. Could have aspirated when he was less responsive due to hypoglycemia, improved with empiric antibiotics which currently are vancomycin and Zosyn, clinically much better, leukocytosis could have been due to recent IV steroid exposure + infection. Will taper to Oral Augmentin for 3 more days, supportive Rx with Nebs as needed, continue Flutter valve.    2. DM type II with hypoglycemia. Likely due to lack of prednisone, note he is  chronically on prednisone along with poor oral intake, he has been replaced on prednisone, and with significant hypoglycemia yesterday evening, will decrease his Novolin 70/30 to 35 twice a day, continue with insulin sliding scale and pre-meal coverage.  Lab Results  Component Value Date   HGBA1C 7.4* 08/04/2015   CBG (last 3)   Recent Labs  08/06/15 2200 08/07/15 0632 08/07/15 1116  GLUCAP 76 112* 105*    3.History of CAD status post CABG. Currently chest pain-free, cardiology following, continue aspirin, statin and beta  blocker for secondary prevention. Troponin mildly elevated but flat trend and with non-ACS pattern. Cardiology on board. Likely mild demand ischemia from pneumonia.  4. Polymyalgia rheumatica. Chronically on prednisone which has been resumed, if blood pressure drops will consider stress dose steroids.  5. ARF on CKD stage IV. Baseline creatinine anywhere between 1.7 and 1.9. ARB was discontinued on 08/04/2015, at baseline now.  6. Lumbar discectomy. Defer management to primary team which is neurosurgery.  7. Dyslipidemia. Continue home dose statin.  8. Gout. On home dose allopurinol.  9. BPH with history of urinary retention requiring self cath intermittently. On Flomax.      DVT Prophylaxis  : Have ordered SCDs, chemical prophylaxis deferred to primary team which is neurosurgery    Lab Results  Component Value Date   PLT 292 08/06/2015    Inpatient Medications  Scheduled Meds: . allopurinol  300 mg Oral Q lunch  . amLODipine  10 mg Oral Q supper  . amoxicillin-clavulanate  1 tablet Oral Q12H  . aspirin EC  81 mg Oral Daily  . atorvastatin  40 mg Oral q1800  . bisacodyl  10 mg Rectal Daily  . budesonide (PULMICORT) nebulizer solution  0.25 mg Nebulization BID  . calcitRIOL  0.25 mcg Oral Q M,W,F  . cycloSPORINE  1 drop Both Eyes BID  . docusate sodium  200 mg Oral BID  . finasteride  5 mg Oral QHS  . gabapentin  300 mg Oral TID  . insulin aspart   0-20 Units Subcutaneous TID WC  . insulin aspart  0-5 Units Subcutaneous QHS  . insulin aspart  5 Units Subcutaneous TID WC  . insulin aspart protamine- aspart  35 Units Subcutaneous BID WC  . ipratropium-albuterol  3 mL Nebulization BID  . Liraglutide  1.2 mg Subcutaneous Q1500  . metoprolol  50 mg Oral BID  . montelukast  10 mg Oral QHS  . multivitamin  2 tablet Oral BID  . pilocarpine  1 drop Both Eyes TID  . polyethylene glycol  17 g Oral BID  . predniSONE  7 mg Oral Q breakfast  . senna  1 tablet Oral Daily  . tamsulosin  0.4 mg Oral QHS   Continuous Infusions:  PRN Meds:.acetaminophen **OR** [DISCONTINUED] acetaminophen, diazepam, ipratropium-albuterol, [DISCONTINUED] ondansetron **OR** ondansetron (ZOFRAN) IV, oxyCODONE, phenol, senna-docusate, sodium chloride flush, sodium phosphate, tiZANidine  Antibiotics  :    Anti-infectives    Start     Dose/Rate Route Frequency Ordered Stop   08/06/15 1000  amoxicillin-clavulanate (AUGMENTIN) 500-125 MG per tablet 500 mg     1 tablet Oral Every 12 hours 08/06/15 0939     08/04/15 1200  vancomycin (VANCOCIN) 1,250 mg in sodium chloride 0.9 % 250 mL IVPB  Status:  Discontinued     1,250 mg 166.7 mL/hr over 90 Minutes Intravenous Every 24 hours 08/03/15 1152 08/06/15 0939   08/03/15 2200  piperacillin-tazobactam (ZOSYN) IVPB 3.375 g  Status:  Discontinued     3.375 g 12.5 mL/hr over 240 Minutes Intravenous Every 8 hours 08/03/15 1152 08/06/15 0939   08/03/15 1200  levofloxacin (LEVAQUIN) IVPB 500 mg  Status:  Discontinued     500 mg 100 mL/hr over 60 Minutes Intravenous Every 24 hours 08/03/15 1130 08/04/15 0807   08/03/15 1200  vancomycin (VANCOCIN) 2,000 mg in sodium chloride 0.9 % 500 mL IVPB     2,000 mg 250 mL/hr over 120 Minutes Intravenous  Once 08/03/15 1152 08/03/15 1436   08/03/15 1200  piperacillin-tazobactam (ZOSYN)  IVPB 3.375 g     3.375 g 100 mL/hr over 30 Minutes Intravenous  Once 08/03/15 1152 08/03/15 1306          Objective:   Filed Vitals:   08/06/15 2100 08/06/15 2109 08/07/15 0103 08/07/15 0537  BP: 132/62  141/76 164/80  Pulse: 85  80 87  Temp: 97.3 F (36.3 C)  98.6 F (37 C) 98.9 F (37.2 C)  TempSrc: Oral  Oral Oral  Resp: 18  18 20   Height:      Weight:      SpO2: 96% 96% 98% 98%    Wt Readings from Last 3 Encounters:  07/24/15 105.824 kg (233 lb 4.8 oz)  03/27/15 109.317 kg (241 lb)  11/28/14 107.956 kg (238 lb)     Intake/Output Summary (Last 24 hours) at 08/07/15 1126 Last data filed at 08/06/15 1800  Gross per 24 hour  Intake      0 ml  Output     75 ml  Net    -75 ml     Physical Exam  Awake Alert, Oriented X 3, No new F.N deficits, Normal affect Staples.AT,PERRAL Supple Neck,No JVD, No cervical lymphadenopathy appriciated.  Symmetrical Chest wall movement, Good air movement bilaterally, No rales RRR,No Gallops,Rubs or new Murmurs, No Parasternal Heave,  +ve B.Sounds, Abd Soft, No tenderness, No organomegaly appriciated, No rebound - guarding or rigidity.  No Cyanosis, Clubbing or edema, No new Rash or bruise      Data Review:    CBC  Recent Labs Lab 08/03/15 1030 08/04/15 0635 08/05/15 0512 08/06/15 0445  WBC 12.9* 11.1* 19.1* 15.8*  HGB 11.6* 10.2* 10.0* 9.5*  HCT 37.1* 32.3* 31.2* 29.9*  PLT 249 241 256 292  MCV 95.1 92.8 92.9 92.9  MCH 29.7 29.3 29.8 29.5  MCHC 31.3 31.6 32.1 31.8  RDW 15.3 15.0 14.7 14.8  LYMPHSABS 1.0 0.5*  --   --   MONOABS 0.9 0.1  --   --   EOSABS 0.3 0.0  --   --   BASOSABS 0.0 0.0  --   --     Chemistries   Recent Labs Lab 08/03/15 1030 08/04/15 0635 08/05/15 0512 08/06/15 0445 08/07/15 0604  NA 140 135 136 140 140  K 3.9 4.5 3.9 3.6 3.5  CL 103 97* 100* 104 101  CO2 28 26 28 30 30   GLUCOSE 135* 393* 273* 106* 91  BUN 19 33* 42* 37* 27*  CREATININE 1.73* 2.06* 2.02* 1.65* 1.56*  CALCIUM 8.3* 8.0* 8.2* 8.6* 8.9  AST 19 18  --   --   --   ALT 19 21  --   --   --   ALKPHOS 82 71  --   --   --   BILITOT  1.5* 1.6*  --   --   --    ------------------------------------------------------------------------------------------------------------------ No results for input(s): CHOL, HDL, LDLCALC, TRIG, CHOLHDL, LDLDIRECT in the last 72 hours.  Lab Results  Component Value Date   HGBA1C 7.4* 08/04/2015   ------------------------------------------------------------------------------------------------------------------ No results for input(s): TSH, T4TOTAL, T3FREE, THYROIDAB in the last 72 hours.  Invalid input(s): FREET3 ------------------------------------------------------------------------------------------------------------------ No results for input(s): VITAMINB12, FOLATE, FERRITIN, TIBC, IRON, RETICCTPCT in the last 72 hours.  Coagulation profile No results for input(s): INR, PROTIME in the last 168 hours.  No results for input(s): DDIMER in the last 72 hours.  Cardiac Enzymes  Recent Labs Lab 08/03/15 1203 08/03/15 1830 08/04/15 0032  TROPONINI 0.04* 0.42* 0.32*   ------------------------------------------------------------------------------------------------------------------  Component Value Date/Time   BNP 265.8* 08/03/2015 1203    Micro Results Recent Results (from the past 240 hour(s))  Culture, blood (routine x 2)     Status: None (Preliminary result)   Collection Time: 08/03/15 12:05 PM  Result Value Ref Range Status   Specimen Description BLOOD LEFT ANTECUBITAL  Final   Special Requests BOTTLES DRAWN AEROBIC AND ANAEROBIC 10CC  Final   Culture NO GROWTH 3 DAYS  Final   Report Status PENDING  Incomplete  Culture, blood (routine x 2)     Status: None (Preliminary result)   Collection Time: 08/03/15 12:12 PM  Result Value Ref Range Status   Specimen Description BLOOD RIGHT ANTECUBITAL  Final   Special Requests BOTTLES DRAWN AEROBIC AND ANAEROBIC 10CC  Final   Culture NO GROWTH 3 DAYS  Final   Report Status PENDING  Incomplete    Radiology Reports X-ray Chest  Pa Or Ap  07/30/2015  CLINICAL DATA:  Central line placement EXAM: CHEST 1 VIEW COMPARISON:  07/25/2015 FINDINGS: Postoperative changes in the mediastinum. Placement of a right central venous catheter with tip over the upper SVC region. The catheter demonstrate somewhat lateral orientation but this is likely due to patient rotation. No pneumothorax. Cardiac enlargement without significant vascular congestion. No focal airspace disease or consolidation in the lungs. No blunting of costophrenic angles. Calcified and tortuous aorta. Calcified granuloma in the right lung base. IMPRESSION: Right central venous catheter projects over the upper SVC region. No pneumothorax. Cardiac enlargement. Electronically Signed   By: Lucienne Capers M.D.   On: 07/30/2015 21:57   Dg Chest 2 View  08/05/2015  CLINICAL DATA:  Community acquired pneumonia EXAM: CHEST  2 VIEW COMPARISON:  08/04/2015 FINDINGS: Cardiomegaly again noted. Right IJ central line is unchanged in position. Status post median sternotomy. There is patchy residual right perihilar asymmetric edema or infiltrate. Bony thorax is stable. Degenerative changes mid and lower thoracic spine. IMPRESSION: Right perihilar residual patchy asymmetric edema or infiltrate. No pulmonary edema. Status post CABG. Stable right IJ central line position PICC Electronically Signed   By: Lahoma Crocker M.D.   On: 08/05/2015 13:15   Dg Lumbar Spine 2-3 Views  07/30/2015  CLINICAL DATA:  Lumbar laminectomy and decompression microdiskectomy L1 level EXAM: LUMBAR SPINE - 2-3 VIEW COMPARISON:  Portable exam 1852 hours labeled as #1 is compared to MR of 07/24/2015 FINDINGS: No numbering system is identified on MR. Current cross-table lateral radiographic exam is therefore labeled assuming 5 lumbar vertebra. This numbering system should be correlated with any numbering system utilized on previous imaging to ensure consistency. Metallic probe via dorsal approach projects dorsal to the L4-L5  disc space at the inferior margin of the L4 spinous process. IMPRESSION: Dorsal localization of the inferior aspect of the spinous process of L4, dorsal to the L4-L5 disc space level. Electronically Signed   By: Lavonia Dana M.D.   On: 07/30/2015 21:55   Ct Head Wo Contrast  08/03/2015  CLINICAL DATA:  Is altered mental status.  Recent back surgery. EXAM: CT HEAD WITHOUT CONTRAST TECHNIQUE: Contiguous axial images were obtained from the base of the skull through the vertex without intravenous contrast. COMPARISON:  None. FINDINGS: Ventricles are normal in size, for this patient's age, and normal in configuration. There are no parenchymal masses or mass effect. There is no evidence of a cortical infarct. Mild periventricular white matter hypoattenuation is noted consistent with chronic microvascular ischemic change. Small focal hypoattenuation area in the left base a  ganglia suggests old lacune infarct. There are no extra-axial masses or abnormal fluid collections. There is no intracranial hemorrhage. Visualized sinuses and mastoid air cells are clear. IMPRESSION: 1. No acute intracranial abnormalities. 2. Age related volume loss. Mild chronic microvascular ischemic change. Electronically Signed   By: Lajean Manes M.D.   On: 08/03/2015 15:16   Ct Chest High Resolution  07/28/2015  CLINICAL DATA:  75 year old male with history of sarcoidosis and COPD. History of asthma. EXAM: CT CHEST WITHOUT CONTRAST TECHNIQUE: Multidetector CT imaging of the chest was performed following the standard protocol without intravenous contrast. High resolution imaging of the lungs, as well as inspiratory and expiratory imaging, was performed. COMPARISON:  Chest CT 06/20/2010. FINDINGS: Mediastinum/Lymph Nodes: Heart size is mildly enlarged. There is no significant pericardial fluid, thickening or pericardial calcification. There is atherosclerosis of the thoracic aorta, the great vessels of the mediastinum and the coronary arteries,  including calcified atherosclerotic plaque in the left main, left anterior descending, left circumflex and right coronary arteries. Status post median sternotomy for CABG, including LIMA to the LAD. No pathologically enlarged mediastinal or hilar lymph nodes. Please note that accurate exclusion of hilar adenopathy is limited on noncontrast CT scans. Esophagus is unremarkable in appearance. No axillary lymphadenopathy. Lungs/Pleura: Large calcified granuloma with surrounding tiny calcified granulomas in the base of the right lower lobe. High-resolution images demonstrate linear areas of architectural distortion in the dependent portions of the lower lobes of the lungs bilaterally which are new compared to the prior study. This is associated with some very mild cylindrical bronchiectasis in the basal segments of the lower lobes dependently. There are no other more widespread areas of ground-glass attenuation, subpleural reticulation, traction bronchiectasis or frank honeycombing. Inspiratory and expiratory imaging demonstrates mild air trapping, indicative of small airways disease. No acute consolidative airspace disease. No pleural effusions. No suspicious appearing pulmonary nodules or masses. Upper abdomen: A tiny calcified granuloma in the right lobe of the liver. Atherosclerosis. Musculoskeletal: There are no aggressive appearing lytic or blastic lesions noted in the visualized portions of the skeleton. IMPRESSION: 1. Interval development of what appear to be areas of post infectious or inflammatory scarring in the lower lobes of the lungs bilaterally. This is associated with some very mild cylindrical bronchiectasis in the basal segments of the lower lobes of the lungs as well. 2. The above imaging findings are not typical imaging manifestations of sarcoidosis. Additionally, there is no lymphadenopathy or other typical manifestations of sarcoidosis in the thorax. 3. No evidence to suggest other interstitial lung  disease. 4. Mild air trapping, indicative of mild small airways disease. 5. Small calcified granulomas in the right lower lobe and right lobe of the liver again noted. 6. Atherosclerosis, including left main and 3 vessel coronary artery disease. Status post median sternotomy for CABG, including LIMA to the LAD. Electronically Signed   By: Vinnie Langton M.D.   On: 07/28/2015 07:14   Dg Chest Port 1 View  08/04/2015  CLINICAL DATA:  Shortness of breath EXAM: PORTABLE CHEST 1 VIEW COMPARISON:  Chest radiograph from one day prior. FINDINGS: Right internal jugular central venous catheter terminates in the upper third of the superior vena cava. Sternotomy wires appear aligned and intact. CABG clips overlie the mediastinum. Stable cardiomediastinal silhouette with mild cardiomegaly. No pneumothorax. No pleural effusion. Calcified right basilar granuloma is stable. Mild residual pulmonary edema, nearly resolved. Mild bibasilar atelectasis. IMPRESSION: 1. Stable mild cardiomegaly with nearly resolved mild residual pulmonary edema. 2. Mild bibasilar atelectasis. Electronically  Signed   By: Ilona Sorrel M.D.   On: 08/04/2015 11:53   Dg Chest Port 1 View  08/03/2015  CLINICAL DATA:  Productive cough EXAM: PORTABLE CHEST 1 VIEW COMPARISON:  07/30/2015 FINDINGS: Cardiomegaly with pulmonary vascular congestion. Patchy opacities in the right upper lobe, left lower lobe, and right infrahilar region. Differential considerations include multifocal pneumonia versus asymmetric interstitial edema. No definite pleural effusions.  No pneumothorax. Postsurgical changes related to prior CABG. Right IJ venous catheter terminates in the mid SVC. Median sternotomy. IMPRESSION: Cardiomegaly with pulmonary vascular congestion. Multifocal patchy opacities, suspicious for multifocal pneumonia, less likely asymmetric interstitial edema. Electronically Signed   By: Julian Hy M.D.   On: 08/03/2015 11:13   Dg Chest Port 1  View  07/25/2015  CLINICAL DATA:  Preop respiratory exam for lumbar spine disc herniation. Coronary artery disease, COPD, and chronic kidney disease. EXAM: PORTABLE CHEST 1 VIEW COMPARISON:  03/29/2014 FINDINGS: Mild cardiomegaly stable. No evidence pulmonary infiltrate or edema. No evidence pleural effusion. Tiny calcified granuloma in the right lung base remains stable. Prior CABG again noted. IMPRESSION: Stable mild cardiomegaly.  No active lung disease. Electronically Signed   By: Earle Gell M.D.   On: 07/25/2015 21:55   Mr Outside Films Spine  07/26/2015  This examination belongs to an outside facility and is stored here for comparison purposes only.  Contact the originating outside institution for any associated report or interpretation.   Time Spent in minutes  25 minutes   Ferguson Gertner M.D on 08/07/2015 at 11:26 AM  Between 7am to 7pm - Pager - [phone]  After 7pm go to [email or web address] - password [facility]  [facility] -  Office  [phone]

## 2015-08-07 NOTE — Progress Notes (Addendum)
Patients pain is not being controled with current OXY IR 5 mg, he has gotten up several times tonight to use bathroom and around 0430 he was in severe pain I have also given him a 5 mg valium. He has no IV access and in report I was told attending was aware but his Zofran is IV medication no PO order he is having spells of nausea this morning. If he does not go to CIR today perhaps review medications. Will continue to monitor.

## 2015-08-07 NOTE — Progress Notes (Signed)
Patient ID: SUMTER GOZMAN, male   DOB: May 27, 1940, 75 y.o.   MRN: [name] BP 139/60 mmHg  Pulse 93  Temp(Src) 98 F (36.7 C) (Oral)  Resp 20  Ht 5\' 11"  (1.803 m)  Wt 105.824 kg (233 lb 4.8 oz)  BMI 32.55 kg/m2  SpO2 96% Moves all extremities Will work on rehab-states he is ok with a snf.  Wound is clean, dry, without signs of infection.

## 2015-08-07 NOTE — Progress Notes (Signed)
Pt refusing IV pain medication. Notified Dr. Lacy Duverney office that IV pain medication order no longer needed. Pt and family spoke to charge nurse and are now asking for oral pain medication around the clock. Will administer as patient requests as long as MD orders are followed. Wendee Copp

## 2015-08-07 NOTE — Consult Note (Signed)
Physical Medicine and Rehabilitation Consult  Reason for Consult: Lumbar stenosis Referring Physician: Dr. Waldron Labs   HPI: Jacob [name] is a 75 y.o. male with history of CAD s/p CABG, OSA, OSA, COPD, pulmonary sarcoidosis, renal cancer s/p recent ablation, PMR on chronic steriods and back pain with lumbar stenosis with L3/4 HNP who has had problems with gait and multiple falls. He was admitted via OSH on 07/24/15 with recurrent fall, back pain and BLE weakness.  PCCM and cardiology consulted for pre-op clearance and felt that he would be moderate risk for pulmonary complications. He elected to undergo Lumbar lam with diskectomy  L3/4 by Dr. Christella Noa on 07/30/15.  Post op has been limited by pain and has had poor po intake. He has been refusing CPAP use and developed and developed lethargy with MS changes on 05/19 due to hypoglycemia and hypoxia.  He was found to have HCAP and placed on broad spectrum antibiotics for treatment.  Speech therapy consulted for input on swallow and aspiration/reflux  precautions reinforced due history of primary esophageal dysphagia and high risk due to COPD hx.  Therapy ongoing and patient continues to be limited BLE weakness and anxiety. MD recommending CIR for follow up therapy.   Patient states that he was having pulmonary issues for about a month  Prior to hospital admission.  Review of Systems  HENT: Negative for hearing loss.   Eyes: Negative for blurred vision and double vision.  Respiratory: Positive for hemoptysis and shortness of breath. Negative for cough.   Cardiovascular: Negative for chest pain and palpitations.  Gastrointestinal: Positive for heartburn, nausea and constipation.  Genitourinary: Negative for dysuria and urgency.  Musculoskeletal: Positive for myalgias, back pain and falls.       Back pain radiating to hips and BLE  Skin: Negative for itching and rash.  Neurological: Positive for sensory change and weakness. Negative for  dizziness, tingling and headaches.  Psychiatric/Behavioral: The patient has insomnia (due to pain). The patient is not nervous/anxious.       Past Medical History  Diagnosis Date  . Allergy   . Asthma   . Diabetes mellitus without complication ([place])   . Cataract   . Arthritis   . Polymyalgia rheumatica ([facility])     maintained on Prednisone, Plaquenil. Followed by rhuematology every 4 months/[name].  Marland Kitchen COPD (chronic obstructive pulmonary disease) ([place])   . Anemia   . Hypertension   . BPH (benign prostatic hyperplasia)   . Sleep apnea     CPAP   Trying to use  . Shortness of breath dyspnea     with exertion  . Anxiety   . Chronic kidney disease     chronic  kidney failure  kidney function at 42%  . Coronary artery disease     CABG  7 bypasses  . Hepatitis     many years ago  . Cancer of kidney [facility])     Past Surgical History  Procedure Laterality Date  . Appendectomy    . Hernia repair    . Prostate surgery      TURP at [facility].  Marland Kitchen Coronary artery bypass graft  03/17/1995    Wynonia Lawman; followed every six months.  . Cataract extraction w/phaco Right 11/28/2014    Procedure: CATARACT EXTRACTION PHACO AND INTRAOCULAR LENS PLACEMENT (IOC) RIGHT ;  Surgeon: Marylynn Pearson, MD;  Location: [place];  Service: Ophthalmology;  Laterality: Right;  . Lumbar laminectomy/decompression microdiscectomy N/A 07/30/2015    Procedure: LUMBAR  LAMINECTOMY DISCECTOMY ;  Surgeon: Ashok Pall, MD;  Location: [place] NEUR[facility] ORS;  Service: Neurosurgery;  Laterality: N/A;  LUMBAR LAMINECTOMY DISCECTOMY     History reviewed. No pertinent family history.    Social History:  Married. Lives in an independent living community. Wife unable to drive due to recent cardiac issues. He reports that he quit smoking about 58 years ago. He has never used smokeless tobacco. He reports that he does not drink alcohol or use illicit drugs.    Allergies  Allergen Reactions  . Ace Inhibitors Other (See Comments)    Probably nausea and  vomiting per patient   . Actonel [Risedronate] Nausea And Vomiting  . Ciprocinonide [Fluocinolone] Other (See Comments)    Probably nausea and vomiting per patient  . Flunisolide Other (See Comments)    Probably nausea and vomiting per patient   . Metformin And Related Other (See Comments)    Probably nausea and vomiting per patient   . Sertraline Other (See Comments)    Probably nausea and vomiting per patient   . Sulindac Other (See Comments)    Probably nausea and vomiting per patient   . Terazosin Other (See Comments)    Probably nausea and vomiting per patient     Medications Prior to Admission  Medication Sig Dispense Refill  . albuterol (PROVENTIL HFA;VENTOLIN HFA) 108 (90 BASE) MCG/ACT inhaler Inhale 2 puffs into the lungs every 6 (six) hours as needed for wheezing or shortness of breath. 1 Inhaler 2  . allopurinol (ZYLOPRIM) 300 MG tablet Take 300 mg by mouth daily with lunch.    Marland Kitchen amLODipine (NORVASC) 10 MG tablet Take 10 mg by mouth daily with supper.     Marland Kitchen aspirin EC 81 MG tablet Take 81 mg by mouth daily with lunch.    . Brinzolamide-Brimonidine (SIMBRINZA) 1-0.2 % SUSP Place 1 drop into both eyes 2 (two) times daily.     . budesonide-formoterol (SYMBICORT) 160-4.5 MCG/ACT inhaler Inhale 2 puffs into the lungs 2 (two) times daily.    . calcitRIOL (ROCALTROL) 0.25 MCG capsule Take 0.25 mcg by mouth every Monday, Wednesday, and Friday.    . cycloSPORINE (RESTASIS) 0.05 % ophthalmic emulsion Place 1 drop into both eyes 2 (two) times daily.    . finasteride (PROSCAR) 5 MG tablet Take 5 mg by mouth at bedtime.     . gabapentin (NEURONTIN) 300 MG capsule Take 1 capsule (300 mg total) by mouth 3 (three) times daily. PATIENT NEEDS OFFICE VISIT FOR ADDITIONAL REFILLS (Patient taking differently: Take 300 mg by mouth 3 (three) times daily. ) 90 capsule 0  . insulin aspart protamine- aspart (NOVOLOG MIX 70/30) (70-30) 100 UNIT/ML injection Inject 75 Units into the skin 2 (two)  times daily before a meal.     . ipratropium-albuterol (DUONEB) 0.5-2.5 (3) MG/3ML SOLN Take 3 mLs by nebulization every 6 (six) hours as needed (shortness of breath/wheezing). Reported on 03/27/2015    . Liraglutide (VICTOZA) 18 MG/3ML SOPN Inject 1.2 mg into the skin daily at 3 pm.     . losartan (COZAAR) 100 MG tablet Take 100 mg by mouth every morning.     . metoprolol (LOPRESSOR) 50 MG tablet Take 25 mg by mouth 2 (two) times daily.    . montelukast (SINGULAIR) 10 MG tablet Take 1 tablet (10 mg total) by mouth at bedtime. 90 tablet 3  . Multiple Vitamins-Minerals (PRESERVISION AREDS 2 PO) Take 1 tablet by mouth 2 (two) times daily.     Marland Kitchen  pilocarpine (PILOCAR) 2 % ophthalmic solution Place 1 drop into both eyes 3 (three) times daily.    . potassium chloride SA (K-DUR,KLOR-CON) 20 MEQ tablet Take 20 mEq by mouth daily.    . predniSONE (DELTASONE) 1 MG tablet Take 2 mg by mouth daily with breakfast. IN CONJUNCTION WITH ONE 5 MG TABLET TO EQUAL A TOTAL OF 7 MILLIGRAMS    . predniSONE (DELTASONE) 5 MG tablet Take 5 mg by mouth daily with breakfast. IN CONJUNCTION WITH TWO 1 MG TABLETS TO EQUAL A TOTAL OF 7 MILLIGRAMS    . simvastatin (ZOCOR) 80 MG tablet Take 40 mg by mouth daily with supper.    . sodium chloride 0.9 % nebulizer solution Take 3 mLs by nebulization every 6 (six) hours as needed for wheezing.    . Tamsulosin HCl (FLOMAX) 0.4 MG CAPS Take 0.4 mg by mouth at bedtime.     Marland Kitchen tiZANidine (ZANAFLEX) 4 MG capsule Take 4 mg by mouth 3 (three) times daily as needed for muscle spasms.    . furosemide (LASIX) 20 MG tablet Take 1 tablet (20 mg total) by mouth 2 (two) times daily. , as needed. NO MORE REFILLS WITHOUT OFFICE VISIT - 2ND NOTICE (Patient not taking: Reported on 04/25/2015) 30 tablet 0  . oxyCODONE (ROXICODONE) 15 MG immediate release tablet Take 15 mg by mouth every 4 (four) hours as needed.      Home: Home Living Family/patient expects to be discharged to:: Skilled nursing  facility Living Arrangements: Spouse/significant other Additional Comments: per granddaughter pt lives with spouse in retirement community but its not handicapped accessible and there is no assistance  Functional History: Prior Function Level of Independence: Independent with assistive device(s) Gait / Transfers Assistance Needed: began using RW in the last few months ADL's / Homemaking Assistance Needed: indep Comments: was driving Functional Status:  Mobility: Bed Mobility Overal bed mobility: Needs Assistance Bed Mobility: Rolling, Sidelying to Sit Rolling: Supervision Sidelying to sit: Min guard Sit to supine: Mod assist Sit to sidelying: Min assist, HOB elevated General bed mobility comments: increased time, v/c's for sequencing, minimal assist for trunk Transfers Overall transfer level: Needs assistance Equipment used: Rolling walker (2 wheeled) Transfers: Sit to/from Stand Sit to Stand: Min assist Stand pivot transfers: +2 safety/equipment, Mod assist General transfer comment: v/c's to push up from bed Ambulation/Gait Ambulation/Gait assistance: Min assist, +2 safety/equipment Ambulation Distance (Feet): 30 Feet (x1, 50x1) Assistive device: Rolling walker (2 wheeled) Gait Pattern/deviations: Step-to pattern, Decreased stride length, Shuffle General Gait Details: bilat knee weakness, pt with increased L LE pain Gait velocity: dec Gait velocity interpretation: Below normal speed for age/gender    ADL: ADL Overall ADL's : Needs assistance/impaired Eating/Feeding: Set up, Sitting Grooming: Min guard, Sitting Upper Body Bathing: Min guard, Sitting Lower Body Bathing: Moderate assistance, Sit to/from stand Upper Body Dressing : Min guard, Sitting Lower Body Dressing: Maximal assistance, Sit to/from stand Toilet Transfer: Minimal assistance, Ambulation, BSC, RW Toilet Transfer Details (indicate cue type and reason): Simulated by sit to stand from chair Toileting-  Clothing Manipulation and Hygiene: Maximal assistance, Sit to/from stand Functional mobility during ADLs: Minimal assistance, Rolling walker General ADL Comments: Pt fatigues quickly with short distance ambulation in room; consistent verbal cues for standing up tall and leaning anteriorly during mobility. Discussed short term SNF placement; pt is agreeable,  Cognition: Cognition Overall Cognitive Status: Within Functional Limits for tasks assessed Orientation Level: Oriented X4 Cognition Arousal/Alertness: Awake/alert Behavior During Therapy: WFL for tasks assessed/performed Overall Cognitive  Status: Within Functional Limits for tasks assessed Memory: Decreased recall of precautions   Blood pressure 164/80, pulse 87, temperature 98.9 F (37.2 C), temperature source Oral, resp. rate 20, height 5\' 11"  (1.803 m), weight 105.824 kg (233 lb 4.8 oz), SpO2 98 %. Physical Exam  Nursing note and vitals reviewed. Constitutional: He is oriented to person, place, and time. He appears well-developed and well-nourished. No distress.  HENT:  Head: Normocephalic and atraumatic.  Eyes: Conjunctivae and EOM are normal. Pupils are equal, round, and reactive to light.  Neck: Normal range of motion. Neck supple. No tracheal deviation present. No thyromegaly present.  Cardiovascular: Normal rate and regular rhythm.   Respiratory: Effort normal and breath sounds normal. No respiratory distress. He has no wheezes.  GI: Soft. Bowel sounds are normal. He exhibits no distension. There is no tenderness.  Musculoskeletal: He exhibits no edema or tenderness.  Neurological: He is alert and oriented to person, place, and time.  Speech clear. Able to follow basic commands without difficulty. No pain with SLR.  Skin: Skin is warm and dry. He is not diaphoretic.  Psychiatric: He has a normal mood and affect. His speech is normal and behavior is normal. Cognition and memory are normal.    Results for orders placed or  performed during the hospital encounter of 07/24/15 (from the past 24 hour(s))  Glucose, capillary     Status: Abnormal   Collection Time: 08/06/15 11:31 AM  Result Value Ref Range   Glucose-Capillary 169 (H) 65 - 99 mg/dL   Comment 1 Notify RN   Glucose, capillary     Status: Abnormal   Collection Time: 08/06/15  5:03 PM  Result Value Ref Range   Glucose-Capillary 148 (H) 65 - 99 mg/dL   Comment 1 Notify RN    Comment 2 Document in Chart   Glucose, capillary     Status: Abnormal   Collection Time: 08/06/15  8:51 PM  Result Value Ref Range   Glucose-Capillary 49 (L) 65 - 99 mg/dL   Comment 1 Notify RN    Comment 2 Document in Chart   Glucose, capillary     Status: Abnormal   Collection Time: 08/06/15  8:53 PM  Result Value Ref Range   Glucose-Capillary 17 (LL) 65 - 99 mg/dL   Comment 1 Notify RN    Comment 2 Document in Chart   Glucose, capillary     Status: Abnormal   Collection Time: 08/06/15  8:58 PM  Result Value Ref Range   Glucose-Capillary 46 (L) 65 - 99 mg/dL   Comment 1 Notify RN    Comment 2 Document in Chart   Glucose, capillary     Status: Abnormal   Collection Time: 08/06/15  9:35 PM  Result Value Ref Range   Glucose-Capillary 60 (L) 65 - 99 mg/dL   Comment 1 Notify RN    Comment 2 Document in Chart   Glucose, capillary     Status: None   Collection Time: 08/06/15 10:00 PM  Result Value Ref Range   Glucose-Capillary 76 65 - 99 mg/dL  Basic metabolic panel     Status: Abnormal   Collection Time: 08/07/15  6:04 AM  Result Value Ref Range   Sodium 140 135 - 145 mmol/L   Potassium 3.5 3.5 - 5.1 mmol/L   Chloride 101 101 - 111 mmol/L   CO2 30 22 - 32 mmol/L   Glucose, Bld 91 65 - 99 mg/dL   BUN 27 (H) 6 - 20  mg/dL   Creatinine, Ser 1.56 (H) 0.61 - 1.24 mg/dL   Calcium 8.9 8.9 - 10.3 mg/dL   GFR calc non Af Amer 42 (L) >60 mL/min   GFR calc Af Amer 48 (L) >60 mL/min   Anion gap 9 5 - 15  Glucose, capillary     Status: Abnormal   Collection Time: 08/07/15   6:32 AM  Result Value Ref Range   Glucose-Capillary 112 (H) 65 - 99 mg/dL   Comment 1 Notify RN    Comment 2 Document in Chart    Dg Chest 2 View  08/05/2015  CLINICAL DATA:  Community acquired pneumonia EXAM: CHEST  2 VIEW COMPARISON:  08/04/2015 FINDINGS: Cardiomegaly again noted. Right IJ central line is unchanged in position. Status post median sternotomy. There is patchy residual right perihilar asymmetric edema or infiltrate. Bony thorax is stable. Degenerative changes mid and lower thoracic spine. IMPRESSION: Right perihilar residual patchy asymmetric edema or infiltrate. No pulmonary edema. Status post CABG. Stable right IJ central line position PICC Electronically Signed   By: Lahoma Crocker M.D.   On: 08/05/2015 13:15    Assessment/Plan: Diagnosis: Lumbar radiculopathy status post  L3-L4 laminectomy and discectomy on 07/30/2015 1. Does the need for close, 24 hr/day medical supervision in concert with the patient's rehab needs make it unreasonable for this patient to be served in a less intensive setting? Yes 2. Co-Morbidities requiring supervision/potential complications: Severe COPD,, CAD, CABG history, pulmonary sarcoid, polymyalgia rheumatica on chronic steroids 3. Due to bladder management, bowel management, safety, skin/wound care, disease management, medication administration, pain management and patient education, does the patient require 24 hr/day rehab nursing? Yes 4. Does the patient require coordinated care of a physician, rehab nurse, PT (1-2 hrs/day, 5 days/week) and OT (1-2 hrs/day, 5 days/week) to address physical and functional deficits in the context of the above medical diagnosis(es)? Yes Addressing deficits in the following areas: balance, endurance, locomotion, strength, transferring, bowel/bladder control, bathing, dressing, feeding, grooming and toileting 5. Can the patient actively participate in an intensive therapy program of at least 3 hrs of therapy per day at least  5 days per week? Yes 6. The potential for patient to make measurable gains while on inpatient rehab is excellent 7. Anticipated functional outcomes upon discharge from inpatient rehab are modified independent  with PT, modified independent with OT, n/a with SLP. 8. Estimated rehab length of stay to reach the above functional goals is: 7d 9. Does the patient have adequate social supports and living environment to accommodate these discharge functional goals? Yes 10. Anticipated D/C setting: Home 11. Anticipated post D/C treatments: [place] therapy 12. Overall Rehab/Functional Prognosis: excellent  RECOMMENDATIONS: This patient's condition is appropriate for continued rehabilitative care in the following setting: CIR Patient has agreed to participate in recommended program. Yes Note that insurance prior authorization may be required for reimbursement for recommended care.  Comment: Patient was severely deconditioned after pulmonary issues evenprior to admission Needs more intensive medical monitoring during rehabilitative process. Has had pain issues. Needs to be off of IV pain medication prior to rehabilitation.    08/07/2015

## 2015-08-08 ENCOUNTER — Other Ambulatory Visit: Payer: Self-pay

## 2015-08-08 LAB — GLUCOSE, CAPILLARY
Glucose-Capillary: 17 mg/dL — CL (ref 65–99)
Glucose-Capillary: 219 mg/dL — ABNORMAL HIGH (ref 65–99)
Glucose-Capillary: 223 mg/dL — ABNORMAL HIGH (ref 65–99)
Glucose-Capillary: 194 mg/dL — ABNORMAL HIGH (ref 65–99)
Glucose-Capillary: 237 mg/dL — ABNORMAL HIGH (ref 65–99)

## 2015-08-08 LAB — CULTURE, BLOOD (ROUTINE X 2)
Culture: NO GROWTH
Culture: NO GROWTH

## 2015-08-08 MED ORDER — RISAQUAD PO CAPS
2.0000 | ORAL_CAPSULE | Freq: Every day | ORAL | Status: DC
  Administered 2015-08-08 – 2015-08-09 (×2): 2 via ORAL
  Filled 2015-08-08 (×2): qty 2

## 2015-08-08 NOTE — Progress Notes (Signed)
Rehab admissions - I met with patient.  He wants to go to [facility].  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  Case manager and social worker are aware of patient request to go to [facility] SNF.  He does not want to consider inpatient rehab at this time.  Call me for questions.  #[phone]

## 2015-08-08 NOTE — Progress Notes (Signed)
Patient ID: Jacob [name], male   DOB: 1940/07/31, 75 y.o.   MRN: [name] BP 149/75 mmHg  Pulse 90  Temp(Src) 98.2 F (36.8 C) (Oral)  Resp 18  Ht 5\' 11"  (1.803 m)  Wt 105.824 kg (233 lb 4.8 oz)  BMI 32.55 kg/m2  SpO2 96% Alert and oriented x 4, speech is clear and fluent Moving all extremities well Will discharge tomorrow.

## 2015-08-08 NOTE — Progress Notes (Signed)
Consult PROGRESS NOTE                                                                                                                                                                                                             Patient Demographics:    Jacob [name], is a 75 y.o. male, DOB - Dec 08, 1940, [id number]  Admit date - 07/24/2015   Admitting Physician Ashok Pall, MD  Outpatient Primary MD for the patient is SMITH,KRISTI, MD  LOS - 15  No chief complaint on file.      Brief Narrative  Jacob [name] is a 75 y.o. male,  With history of asthma, type 2 diabetes mellitus on insulin, COPD, obstructive sleep apnea not compliant with CPAP, CAD, EKG stage IV baseline creatinine close to 1.7-1.9, BPH, hypertension, polymyalgia rheumatica on steroids who was admitted to neurosurgery on 07/24/2015 for a L3-4 laminectomy and discectomy by neurosurgery. Hospitalist team was consulted on 08/03/2015 when patient became less responsive with evidence of hypoglycemia and HCAP. Of note cardiology and pulmonary have also seen the patient this admission.   Subjective:    Jacob [name] has, No headache, No chest pain, No abdominal pain - No Nausea, No new weakness tingling or numbness, No Cough - SOB, Had an episode of hypoglycemia yesterday evening.   Assessment  & Plan :    Hospitalist consult progress note    1. HCAP. Could have aspirated when he was less responsive due to hypoglycemia, improved with empiric antibiotics which currently are vancomycin and Zosyn, clinically much better, leukocytosis could have been due to recent IV steroid exposure + infection. Will taper to Oral Augmentin for 2 more days, supportive Rx with Nebs as needed, continue Flutter valve.    2. DM type II with hypoglycemia. Likely due to lack of prednisone, note he is  chronically on prednisone along with poor oral intake, he has been replaced on prednisone, Novolin 70/30 has been lowered by primary team to 10 units twice a day, CBG slightly elevated this a.m in low 200's range, but it appears he did not receive his Novolin 70/30 this a.m., discussed with nurse, she will give now.  Lab Results  Component Value Date   HGBA1C 7.4* 08/04/2015   CBG (last 3)   Recent Labs  08/07/15 2153 08/08/15 0620 08/08/15 1056  GLUCAP 252* 219* 223*  3.History of CAD status post CABG. Currently chest pain-free, cardiology following, continue aspirin, statin and beta blocker for secondary prevention. Troponin mildly elevated but flat trend and with non-ACS pattern. Cardiology on board. Likely mild demand ischemia from pneumonia.  4. Polymyalgia rheumatica. Chronically on prednisone which has been resumed, if blood pressure drops will consider stress dose steroids.  5. ARF on CKD stage IV. Baseline creatinine anywhere between 1.7 and 1.9. ARB was discontinued on 08/04/2015, at baseline now.  6. Lumbar discectomy. Defer management to primary team which is neurosurgery.  7. Dyslipidemia. Continue home dose statin.  8. Gout. On home dose allopurinol.  9. BPH with history of urinary retention requiring self cath intermittently. On Flomax.      DVT Prophylaxis  : Have ordered SCDs, chemical prophylaxis deferred to primary team which is neurosurgery    Lab Results  Component Value Date   PLT 292 08/06/2015    Inpatient Medications  Scheduled Meds: . acidophilus  2 capsule Oral Daily  . allopurinol  300 mg Oral Q lunch  . amLODipine  10 mg Oral Q supper  . amoxicillin-clavulanate  1 tablet Oral Q12H  . aspirin EC  81 mg Oral Daily  . atorvastatin  40 mg Oral q1800  . bisacodyl  10 mg Rectal Daily  . budesonide (PULMICORT) nebulizer solution  0.25 mg Nebulization BID  . calcitRIOL  0.25 mcg Oral Q M,W,F  . cycloSPORINE  1 drop Both Eyes BID  . docusate  sodium  200 mg Oral BID  . feeding supplement (GLUCERNA SHAKE)  237 mL Oral TID BM  . finasteride  5 mg Oral QHS  . gabapentin  300 mg Oral TID  . insulin aspart  0-20 Units Subcutaneous TID WC  . insulin aspart  0-5 Units Subcutaneous QHS  . insulin aspart  5 Units Subcutaneous TID WC  . insulin aspart protamine- aspart  10 Units Subcutaneous BID WC  . ipratropium-albuterol  3 mL Nebulization BID  . Liraglutide  1.2 mg Subcutaneous Q1500  . magic mouthwash  5 mL Oral QID  . metoprolol  50 mg Oral BID  . montelukast  10 mg Oral QHS  . multivitamin  2 tablet Oral BID  . pantoprazole  40 mg Oral Daily  . pilocarpine  1 drop Both Eyes TID  . polyethylene glycol  17 g Oral BID  . predniSONE  7 mg Oral Q breakfast  . senna  1 tablet Oral Daily  . sucralfate  1 g Oral TID WC & HS  . tamsulosin  0.4 mg Oral QHS   Continuous Infusions:  PRN Meds:.acetaminophen **OR** [DISCONTINUED] acetaminophen, alum & mag hydroxide-simeth, diazepam, ipratropium-albuterol, [DISCONTINUED] ondansetron **OR** ondansetron (ZOFRAN) IV, oxyCODONE, phenol, senna-docusate, sodium chloride flush, sodium phosphate, tiZANidine  Antibiotics  :    Anti-infectives    Start     Dose/Rate Route Frequency Ordered Stop   08/06/15 1000  amoxicillin-clavulanate (AUGMENTIN) 500-125 MG per tablet 500 mg     1 tablet Oral Every 12 hours 08/06/15 0939     08/04/15 1200  vancomycin (VANCOCIN) 1,250 mg in sodium chloride 0.9 % 250 mL IVPB  Status:  Discontinued     1,250 mg 166.7 mL/hr over 90 Minutes Intravenous Every 24 hours 08/03/15 1152 08/06/15 0939   08/03/15 2200  piperacillin-tazobactam (ZOSYN) IVPB 3.375 g  Status:  Discontinued     3.375 g 12.5 mL/hr over 240 Minutes Intravenous Every 8 hours 08/03/15 1152 08/06/15 0939   08/03/15 1200  levofloxacin (LEVAQUIN)  IVPB 500 mg  Status:  Discontinued     500 mg 100 mL/hr over 60 Minutes Intravenous Every 24 hours 08/03/15 1130 08/04/15 0807   08/03/15 1200  vancomycin  (VANCOCIN) 2,000 mg in sodium chloride 0.9 % 500 mL IVPB     2,000 mg 250 mL/hr over 120 Minutes Intravenous  Once 08/03/15 1152 08/03/15 1436   08/03/15 1200  piperacillin-tazobactam (ZOSYN) IVPB 3.375 g     3.375 g 100 mL/hr over 30 Minutes Intravenous  Once 08/03/15 1152 08/03/15 1306         Objective:   Filed Vitals:   08/08/15 0514 08/08/15 0636 08/08/15 0823 08/08/15 0917  BP:  160/73  152/77  Pulse: 78 86  89  Temp:  98.7 F (37.1 C)  97.7 F (36.5 C)  TempSrc:  Oral  Oral  Resp: 18 20  18   Height:      Weight:      SpO2:  96% 95% 96%    Wt Readings from Last 3 Encounters:  07/24/15 105.824 kg (233 lb 4.8 oz)  03/27/15 109.317 kg (241 lb)  11/28/14 107.956 kg (238 lb)     Intake/Output Summary (Last 24 hours) at 08/08/15 1124 Last data filed at 08/08/15 0840  Gross per 24 hour  Intake    360 ml  Output    500 ml  Net   -140 ml     Physical Exam  Awake Alert, Oriented X 3,  Supple Neck,No JVD,  Symmetrical Chest wall movement, Good air movement bilaterally,  RRR,No Gallops,Rubs or new Murmurs,   +ve B.Sounds, Abd Soft, No tenderness, No organomegaly appriciated, No rebound - guarding or rigidity.  No Cyanosis, Clubbing or edema, No new Rash or bruise      Data Review:    CBC  Recent Labs Lab 08/03/15 1030 08/04/15 0635 08/05/15 0512 08/06/15 0445  WBC 12.9* 11.1* 19.1* 15.8*  HGB 11.6* 10.2* 10.0* 9.5*  HCT 37.1* 32.3* 31.2* 29.9*  PLT 249 241 256 292  MCV 95.1 92.8 92.9 92.9  MCH 29.7 29.3 29.8 29.5  MCHC 31.3 31.6 32.1 31.8  RDW 15.3 15.0 14.7 14.8  LYMPHSABS 1.0 0.5*  --   --   MONOABS 0.9 0.1  --   --   EOSABS 0.3 0.0  --   --   BASOSABS 0.0 0.0  --   --     Chemistries   Recent Labs Lab 08/03/15 1030 08/04/15 0635 08/05/15 0512 08/06/15 0445 08/07/15 0604  NA 140 135 136 140 140  K 3.9 4.5 3.9 3.6 3.5  CL 103 97* 100* 104 101  CO2 28 26 28 30 30   GLUCOSE 135* 393* 273* 106* 91  BUN 19 33* 42* 37* 27*  CREATININE  1.73* 2.06* 2.02* 1.65* 1.56*  CALCIUM 8.3* 8.0* 8.2* 8.6* 8.9  AST 19 18  --   --   --   ALT 19 21  --   --   --   ALKPHOS 82 71  --   --   --   BILITOT 1.5* 1.6*  --   --   --    ------------------------------------------------------------------------------------------------------------------ No results for input(s): CHOL, HDL, LDLCALC, TRIG, CHOLHDL, LDLDIRECT in the last 72 hours.  Lab Results  Component Value Date   HGBA1C 7.4* 08/04/2015   ------------------------------------------------------------------------------------------------------------------ No results for input(s): TSH, T4TOTAL, T3FREE, THYROIDAB in the last 72 hours.  Invalid input(s): FREET3 ------------------------------------------------------------------------------------------------------------------ No results for input(s): VITAMINB12, FOLATE, FERRITIN, TIBC, IRON, RETICCTPCT in the  last 72 hours.  Coagulation profile No results for input(s): INR, PROTIME in the last 168 hours.  No results for input(s): DDIMER in the last 72 hours.  Cardiac Enzymes  Recent Labs Lab 08/03/15 1203 08/03/15 1830 08/04/15 0032  TROPONINI 0.04* 0.42* 0.32*   ------------------------------------------------------------------------------------------------------------------    Component Value Date/Time   BNP 265.8* 08/03/2015 1203    Micro Results Recent Results (from the past 240 hour(s))  Culture, blood (routine x 2)     Status: None (Preliminary result)   Collection Time: 08/03/15 12:05 PM  Result Value Ref Range Status   Specimen Description BLOOD LEFT ANTECUBITAL  Final   Special Requests BOTTLES DRAWN AEROBIC AND ANAEROBIC 10CC  Final   Culture NO GROWTH 4 DAYS  Final   Report Status PENDING  Incomplete  Culture, blood (routine x 2)     Status: None (Preliminary result)   Collection Time: 08/03/15 12:12 PM  Result Value Ref Range Status   Specimen Description BLOOD RIGHT ANTECUBITAL  Final   Special  Requests BOTTLES DRAWN AEROBIC AND ANAEROBIC 10CC  Final   Culture NO GROWTH 4 DAYS  Final   Report Status PENDING  Incomplete    Radiology Reports X-ray Chest Pa Or Ap  07/30/2015  CLINICAL DATA:  Central line placement EXAM: CHEST 1 VIEW COMPARISON:  07/25/2015 FINDINGS: Postoperative changes in the mediastinum. Placement of a right central venous catheter with tip over the upper SVC region. The catheter demonstrate somewhat lateral orientation but this is likely due to patient rotation. No pneumothorax. Cardiac enlargement without significant vascular congestion. No focal airspace disease or consolidation in the lungs. No blunting of costophrenic angles. Calcified and tortuous aorta. Calcified granuloma in the right lung base. IMPRESSION: Right central venous catheter projects over the upper SVC region. No pneumothorax. Cardiac enlargement. Electronically Signed   By: Lucienne Capers M.D.   On: 07/30/2015 21:57   Dg Chest 2 View  08/05/2015  CLINICAL DATA:  Community acquired pneumonia EXAM: CHEST  2 VIEW COMPARISON:  08/04/2015 FINDINGS: Cardiomegaly again noted. Right IJ central line is unchanged in position. Status post median sternotomy. There is patchy residual right perihilar asymmetric edema or infiltrate. Bony thorax is stable. Degenerative changes mid and lower thoracic spine. IMPRESSION: Right perihilar residual patchy asymmetric edema or infiltrate. No pulmonary edema. Status post CABG. Stable right IJ central line position PICC Electronically Signed   By: Lahoma Crocker M.D.   On: 08/05/2015 13:15   Dg Lumbar Spine 2-3 Views  07/30/2015  CLINICAL DATA:  Lumbar laminectomy and decompression microdiskectomy L1 level EXAM: LUMBAR SPINE - 2-3 VIEW COMPARISON:  Portable exam 1852 hours labeled as #1 is compared to MR of 07/24/2015 FINDINGS: No numbering system is identified on MR. Current cross-table lateral radiographic exam is therefore labeled assuming 5 lumbar vertebra. This numbering system  should be correlated with any numbering system utilized on previous imaging to ensure consistency. Metallic probe via dorsal approach projects dorsal to the L4-L5 disc space at the inferior margin of the L4 spinous process. IMPRESSION: Dorsal localization of the inferior aspect of the spinous process of L4, dorsal to the L4-L5 disc space level. Electronically Signed   By: Lavonia Dana M.D.   On: 07/30/2015 21:55   Ct Head Wo Contrast  08/03/2015  CLINICAL DATA:  Is altered mental status.  Recent back surgery. EXAM: CT HEAD WITHOUT CONTRAST TECHNIQUE: Contiguous axial images were obtained from the base of the skull through the vertex without intravenous contrast. COMPARISON:  None.  FINDINGS: Ventricles are normal in size, for this patient's age, and normal in configuration. There are no parenchymal masses or mass effect. There is no evidence of a cortical infarct. Mild periventricular white matter hypoattenuation is noted consistent with chronic microvascular ischemic change. Small focal hypoattenuation area in the left base a ganglia suggests old lacune infarct. There are no extra-axial masses or abnormal fluid collections. There is no intracranial hemorrhage. Visualized sinuses and mastoid air cells are clear. IMPRESSION: 1. No acute intracranial abnormalities. 2. Age related volume loss. Mild chronic microvascular ischemic change. Electronically Signed   By: Lajean Manes M.D.   On: 08/03/2015 15:16   Ct Chest High Resolution  07/28/2015  CLINICAL DATA:  75 year old male with history of sarcoidosis and COPD. History of asthma. EXAM: CT CHEST WITHOUT CONTRAST TECHNIQUE: Multidetector CT imaging of the chest was performed following the standard protocol without intravenous contrast. High resolution imaging of the lungs, as well as inspiratory and expiratory imaging, was performed. COMPARISON:  Chest CT 06/20/2010. FINDINGS: Mediastinum/Lymph Nodes: Heart size is mildly enlarged. There is no significant  pericardial fluid, thickening or pericardial calcification. There is atherosclerosis of the thoracic aorta, the great vessels of the mediastinum and the coronary arteries, including calcified atherosclerotic plaque in the left main, left anterior descending, left circumflex and right coronary arteries. Status post median sternotomy for CABG, including LIMA to the LAD. No pathologically enlarged mediastinal or hilar lymph nodes. Please note that accurate exclusion of hilar adenopathy is limited on noncontrast CT scans. Esophagus is unremarkable in appearance. No axillary lymphadenopathy. Lungs/Pleura: Large calcified granuloma with surrounding tiny calcified granulomas in the base of the right lower lobe. High-resolution images demonstrate linear areas of architectural distortion in the dependent portions of the lower lobes of the lungs bilaterally which are new compared to the prior study. This is associated with some very mild cylindrical bronchiectasis in the basal segments of the lower lobes dependently. There are no other more widespread areas of ground-glass attenuation, subpleural reticulation, traction bronchiectasis or frank honeycombing. Inspiratory and expiratory imaging demonstrates mild air trapping, indicative of small airways disease. No acute consolidative airspace disease. No pleural effusions. No suspicious appearing pulmonary nodules or masses. Upper abdomen: A tiny calcified granuloma in the right lobe of the liver. Atherosclerosis. Musculoskeletal: There are no aggressive appearing lytic or blastic lesions noted in the visualized portions of the skeleton. IMPRESSION: 1. Interval development of what appear to be areas of post infectious or inflammatory scarring in the lower lobes of the lungs bilaterally. This is associated with some very mild cylindrical bronchiectasis in the basal segments of the lower lobes of the lungs as well. 2. The above imaging findings are not typical imaging  manifestations of sarcoidosis. Additionally, there is no lymphadenopathy or other typical manifestations of sarcoidosis in the thorax. 3. No evidence to suggest other interstitial lung disease. 4. Mild air trapping, indicative of mild small airways disease. 5. Small calcified granulomas in the right lower lobe and right lobe of the liver again noted. 6. Atherosclerosis, including left main and 3 vessel coronary artery disease. Status post median sternotomy for CABG, including LIMA to the LAD. Electronically Signed   By: Vinnie Langton M.D.   On: 07/28/2015 07:14   Dg Chest Port 1 View  08/04/2015  CLINICAL DATA:  Shortness of breath EXAM: PORTABLE CHEST 1 VIEW COMPARISON:  Chest radiograph from one day prior. FINDINGS: Right internal jugular central venous catheter terminates in the upper third of the superior vena cava. Sternotomy wires  appear aligned and intact. CABG clips overlie the mediastinum. Stable cardiomediastinal silhouette with mild cardiomegaly. No pneumothorax. No pleural effusion. Calcified right basilar granuloma is stable. Mild residual pulmonary edema, nearly resolved. Mild bibasilar atelectasis. IMPRESSION: 1. Stable mild cardiomegaly with nearly resolved mild residual pulmonary edema. 2. Mild bibasilar atelectasis. Electronically Signed   By: Ilona Sorrel M.D.   On: 08/04/2015 11:53   Dg Chest Port 1 View  08/03/2015  CLINICAL DATA:  Productive cough EXAM: PORTABLE CHEST 1 VIEW COMPARISON:  07/30/2015 FINDINGS: Cardiomegaly with pulmonary vascular congestion. Patchy opacities in the right upper lobe, left lower lobe, and right infrahilar region. Differential considerations include multifocal pneumonia versus asymmetric interstitial edema. No definite pleural effusions.  No pneumothorax. Postsurgical changes related to prior CABG. Right IJ venous catheter terminates in the mid SVC. Median sternotomy. IMPRESSION: Cardiomegaly with pulmonary vascular congestion. Multifocal patchy opacities,  suspicious for multifocal pneumonia, less likely asymmetric interstitial edema. Electronically Signed   By: Julian Hy M.D.   On: 08/03/2015 11:13   Dg Chest Port 1 View  07/25/2015  CLINICAL DATA:  Preop respiratory exam for lumbar spine disc herniation. Coronary artery disease, COPD, and chronic kidney disease. EXAM: PORTABLE CHEST 1 VIEW COMPARISON:  03/29/2014 FINDINGS: Mild cardiomegaly stable. No evidence pulmonary infiltrate or edema. No evidence pleural effusion. Tiny calcified granuloma in the right lung base remains stable. Prior CABG again noted. IMPRESSION: Stable mild cardiomegaly.  No active lung disease. Electronically Signed   By: Earle Gell M.D.   On: 07/25/2015 21:55   Mr [employer] Spine  07/26/2015  This examination belongs to an outside facility and is stored here for comparison purposes only.  Contact the originating outside institution for any associated report or interpretation.   Time Spent in minutes  20 minutes   ELGERGAWY, DAWOOD M.D on 08/08/2015 at 11:24 AM  Between 7am to 7pm - Pager - [phone]  After 7pm go to [email or web address] - password [facility]  [facility] -  Office  [phone]

## 2015-08-08 NOTE — Progress Notes (Signed)
Patient refused home CPAP for tonight.

## 2015-08-08 NOTE — Clinical Social Work Note (Signed)
CSW spoke with patient and his family, they have agreed to go to [facility] for SNF for short term rehab.  CSW notified [facility] and they can accept patient on Friday once he is medically ready for discharge and orders have been received.  CSW to continue to follow patient's progress throughout discharge planning.  Jones Broom. [place], MSW, [place] 08/08/2015 4:03 PM

## 2015-08-08 NOTE — Care Management Note (Signed)
Case Management Note  Patient Details  Name: Jacob [name] MRN: [name] Date of Birth: [birth year]  Subjective/Objective:                    Action/Plan: Plan is for patient to d/c to SNF. CM continuing to follow for d/c needs.   Expected Discharge Date:                  Expected Discharge Plan:     In-House Referral:     Discharge planning Services     Post Acute Care Choice:    Choice offered to:     DME Arranged:    DME Agency:     HH Arranged:    HH Agency:     Status of Service:  In process, will continue to follow  Medicare Important Message Given:    Date Medicare IM Given:    Medicare IM give by:    Date Additional Medicare IM Given:    Additional Medicare Important Message give by:     If discussed at [place] of Stay Meetings, dates discussed:    Additional Comments:  Pollie Friar, RN 08/08/2015, 2:04 PM

## 2015-08-08 NOTE — Progress Notes (Signed)
Subjective:  Feels much better this morning.  Pain is under better control.  Had some mild dyspnea last night and was given a breathing treatment.  Breathing is fine this morning and no shortness of breath or chest pain.  Objective:  Vital Signs in the last 24 hours: BP 160/73 mmHg  Pulse 86  Temp(Src) 98.7 F (37.1 C) (Oral)  Resp 20  Ht 5\' 11"  (1.803 m)  Wt 105.824 kg (233 lb 4.8 oz)  BMI 32.55 kg/m2  SpO2 95%  Physical Exam: Pleasant WM in NAD, currently in no acute distress Lungs:  Clear Cardiac:  Regular rhythm, normal S1 and S2, no S3 Extremities:  No edema present  Intake/Output from previous day: 05/24 0701 - 05/25 0700 In: 240 [P.O.:240] Out: 500 [Urine:500]  Weight Filed Weights   07/24/15 2217  Weight: 105.824 kg (233 lb 4.8 oz)    Lab Results: Basic Metabolic Panel:  Recent Labs  08/06/15 0445 08/07/15 0604  NA 140 140  K 3.6 3.5  CL 104 101  CO2 30 30  GLUCOSE 106* 91  BUN 37* 27*  CREATININE 1.65* 1.56*   CBC:  Recent Labs  08/06/15 0445  WBC 15.8*  HGB 9.5*  HCT 29.9*  MCV 92.9  PLT 292    Assessment/Plan: 1. Demand ischemia-his previous tachycardia and ST depression has resolved on his recent EKG 2. CAD with prior CABG 3.  Acute on chronic renal failure improved today.  4.  Aspiration pneumonia better 5.  Significant postoperative pain Better controlled    Cardiac status is stable and his renal function is improving.  Awaiting transfer to rehabilitation   W. Doristine Church  MD [facility]  08/08/2015, 9:18 AM

## 2015-08-08 NOTE — Progress Notes (Signed)
Physical Therapy Treatment Patient Details Name: Jacob [name] MRN: [id number] DOB: June 23, 1940 Today's Date: 08/08/2015    History of Present Illness GLENN POLKINGHORN 75 y.o. male with severe pain and weakness bilaterally lower extremities. MRI shows severe lumbar stenosis and a large HNP at L3/4 bilaterally. Pt underwent L3/4 bilateral lumbar laminectomy on 5/16. Bout with AMS, fever 5/20; Noted pna on chest x-ray    PT Comments    Patient progressing well towards PT goals. Improved ambulation distance today but continues to require frequent seated rest breaks due to BLE weakness. Cues to recall back precautions. Eager to return to PLOF. Appropriate for ST SNF. Will follow acutely.   Follow Up Recommendations  SNF;Supervision/Assistance - 24 hour     Equipment Recommendations  None recommended by PT    Recommendations for Other Services       Precautions / Restrictions Precautions Precautions: Fall;Back Precaution Booklet Issued: No Precaution Comments: re-educated pt on precautions, poor recall Restrictions Weight Bearing Restrictions: No    Mobility  Bed Mobility               General bed mobility comments: UP in chair upon PT arrival.  Transfers Overall transfer level: Needs assistance Equipment used: Rolling walker (2 wheeled) Transfers: Sit to/from Stand Sit to Stand: Min guard         General transfer comment: Good demo of technique. Stood from chair x3.  Ambulation/Gait Ambulation/Gait assistance: Min assist;+2 safety/equipment Ambulation Distance (Feet): 75 Feet (+ 50' + 85') Assistive device: Rolling walker (2 wheeled) Gait Pattern/deviations: Step-through pattern;Decreased stride length;Trunk flexed Gait velocity: dec Gait velocity interpretation: Below normal speed for age/gender General Gait Details: Cues to increase stride length; BLE weakness requiring 3 seated rest breaks. + dizziness.   Stairs            Wheelchair Mobility     Modified Rankin (Stroke Patients Only)       Balance Overall balance assessment: Needs assistance Sitting-balance support: Feet supported;No upper extremity supported Sitting balance-Leahy Scale: Good     Standing balance support: During functional activity Standing balance-Leahy Scale: Fair                      Cognition Arousal/Alertness: Awake/alert Behavior During Therapy: WFL for tasks assessed/performed Overall Cognitive Status: Within Functional Limits for tasks assessed       Memory: Decreased recall of precautions              Exercises      General Comments        Pertinent Vitals/Pain Pain Assessment: Faces Faces Pain Scale: Hurts a little bit Pain Location: back Pain Descriptors / Indicators: Sore Pain Intervention(s): Monitored during session    Home Living                      Prior Function            PT Goals (current goals can now be found in the care plan section) Progress towards PT goals: Progressing toward goals    Frequency  Min 5X/week    PT Plan Current plan remains appropriate    Co-evaluation             End of Session Equipment Utilized During Treatment: Gait belt Activity Tolerance: Patient tolerated treatment well Patient left: in chair;with call bell/phone within reach;with chair alarm set;Other (comment) (RT in room.)     Time: 0800-0823 PT Time Calculation (min) (ACUTE ONLY): 23  min  Charges:  $Gait Training: 23-37 mins                    G Codes:      Lacie Draft 08/08/2015, 8:49 AM Wray Kearns, PT, DPT [phone]

## 2015-08-08 NOTE — Consult Note (Signed)
   [facility] CM Inpatient Consult   08/08/2015  Jacob [name] [birth year] [name]  Follow up - Referral received.  Patient evaluated for community based chronic disease management services with [facility] as a benefit of patient's Health Team [employer]. Spoke with patient at bedside to explain [place] [facility] services. Consent form signed and copy of information given in folder.  Patient is planning to go to [facility] for his rehab.  He states his wife broke her neck a week prior to him being admitted and they both now are unable to drive but "is hopeful that by the time he returns home their transportation will be figured out."  Patient will receive post rehab follow up call and will be evaluated for monthly home visits for assessments and disease process education.  Left contact information and [facility] literature at bedside.  Of note, [facility] Care Management services does not replace or interfere with any services that are arranged by inpatient case management or social work.  For additional questions or referrals please contact:    Natividad Brood, RN BSN [facility] Liaison  [phone] business mobile phone Toll free office [phone]

## 2015-08-09 DIAGNOSIS — R2681 Unsteadiness on feet: Secondary | ICD-10-CM | POA: Diagnosis not present

## 2015-08-09 DIAGNOSIS — N289 Disorder of kidney and ureter, unspecified: Secondary | ICD-10-CM | POA: Diagnosis not present

## 2015-08-09 DIAGNOSIS — M961 Postlaminectomy syndrome, not elsewhere classified: Secondary | ICD-10-CM | POA: Diagnosis not present

## 2015-08-09 DIAGNOSIS — R262 Difficulty in walking, not elsewhere classified: Secondary | ICD-10-CM | POA: Diagnosis not present

## 2015-08-09 DIAGNOSIS — Z4789 Encounter for other orthopedic aftercare: Secondary | ICD-10-CM | POA: Diagnosis not present

## 2015-08-09 DIAGNOSIS — R1312 Dysphagia, oropharyngeal phase: Secondary | ICD-10-CM | POA: Diagnosis not present

## 2015-08-09 DIAGNOSIS — J189 Pneumonia, unspecified organism: Secondary | ICD-10-CM | POA: Diagnosis not present

## 2015-08-09 DIAGNOSIS — M6281 Muscle weakness (generalized): Secondary | ICD-10-CM | POA: Diagnosis not present

## 2015-08-09 DIAGNOSIS — M4806 Spinal stenosis, lumbar region: Secondary | ICD-10-CM | POA: Diagnosis not present

## 2015-08-09 LAB — GLUCOSE, CAPILLARY
Glucose-Capillary: 207 mg/dL — ABNORMAL HIGH (ref 65–99)
Glucose-Capillary: 135 mg/dL — ABNORMAL HIGH (ref 65–99)

## 2015-08-09 MED ORDER — RISAQUAD PO CAPS: 2.0000 | ORAL_CAPSULE | Freq: Every day | ORAL | Status: DC

## 2015-08-09 MED ORDER — OXYCODONE-ACETAMINOPHEN 5-325 MG PO TABS: 1.0000 | ORAL_TABLET | Freq: Four times a day (QID) | ORAL | Status: DC | PRN

## 2015-08-09 MED ORDER — GLUCERNA SHAKE PO LIQD: 237.0000 mL | Freq: Three times a day (TID) | ORAL | Status: DC

## 2015-08-09 MED ORDER — INSULIN ASPART PROT & ASPART (70-30 MIX) 100 UNIT/ML ~~LOC~~ SUSP: SUBCUTANEOUS | Status: DC

## 2015-08-09 MED ORDER — CYCLOBENZAPRINE HCL 10 MG PO TABS: 10.0000 mg | ORAL_TABLET | Freq: Three times a day (TID) | ORAL | Status: DC | PRN

## 2015-08-09 MED ORDER — AMOXICILLIN-POT CLAVULANATE 500-125 MG PO TABS: 1.0000 | ORAL_TABLET | Freq: Two times a day (BID) | ORAL | Status: DC

## 2015-08-09 MED ORDER — IPRATROPIUM-ALBUTEROL 0.5-2.5 (3) MG/3ML IN SOLN: 3.0000 mL | Freq: Four times a day (QID) | RESPIRATORY_TRACT | Status: DC | PRN

## 2015-08-09 MED ORDER — DOCUSATE SODIUM 100 MG PO CAPS: 200.0000 mg | ORAL_CAPSULE | Freq: Two times a day (BID) | ORAL | Status: DC

## 2015-08-09 MED ORDER — ACETAMINOPHEN 325 MG PO TABS: 650.0000 mg | ORAL_TABLET | Freq: Four times a day (QID) | ORAL | Status: DC | PRN

## 2015-08-09 MED ORDER — OXYCODONE HCL 5 MG PO TABS: 5.0000 mg | ORAL_TABLET | ORAL | Status: DC | PRN

## 2015-08-09 MED ORDER — METOPROLOL TARTRATE 50 MG PO TABS: 50.0000 mg | ORAL_TABLET | Freq: Two times a day (BID) | ORAL | Status: DC

## 2015-08-09 MED ORDER — INSULIN ASPART 100 UNIT/ML ~~LOC~~ SOLN: 0.0000 [IU] | Freq: Three times a day (TID) | SUBCUTANEOUS | Status: DC

## 2015-08-09 NOTE — Care Management Note (Signed)
Case Management Note  Patient Details  Name: Jacob [name] MRN: [id number] Date of Birth: 22-Apr-1940  Subjective/Objective:                    Action/Plan: Plan is for patient to discharge to [facility] today. No further needs per CM.   Expected Discharge Date:                  Expected Discharge Plan:  Skilled Nursing Facility  In-House Referral:  Clinical Social Work  Discharge planning Services  CM Consult  Post Acute Care Choice:    Choice offered to:     DME Arranged:    DME Agency:     HH Arranged:    Singer Agency:     Status of Service:  Completed, signed off  Medicare Important Message Given:    Date Medicare IM Given:    Medicare IM give by:    Date Additional Medicare IM Given:    Additional Medicare Important Message give by:     If discussed at [place] of Stay Meetings, dates discussed:    Additional Comments:  Pollie Friar, RN 08/09/2015, 11:54 AM

## 2015-08-09 NOTE — Progress Notes (Signed)
Consult PROGRESS NOTE                                                                                                                                                                                                             Patient Demographics:    Jacob [name], is a 75 y.o. male, DOB - [birth year], [id number]  Admit date - 07/24/2015   Admitting Physician Ashok Pall, MD  Outpatient Primary MD for the patient is SMITH,KRISTI, MD  LOS - 16  No chief complaint on file.      Brief Narrative  Jacob [name] is a 75 y.o. male,  With history of asthma, type 2 diabetes mellitus on insulin, COPD, obstructive sleep apnea not compliant with CPAP, CAD, EKG stage IV baseline creatinine close to 1.7-1.9, BPH, hypertension, polymyalgia rheumatica on steroids who was admitted to neurosurgery on 07/24/2015 for a L3-4 laminectomy and discectomy by neurosurgery. Hospitalist team was consulted on 08/03/2015 when patient became less responsive with evidence of hypoglycemia and HCAP. Of note cardiology and pulmonary have also seen the patient this admission. - agent has been improving, plan is to discharge [name]   Subjective:    Jacob [name] has, No headache, No chest pain, No abdominal pain - No Nausea, No new weakness tingling or numbness, No Cough - SOB, No further hypoglycemic episodes, reports poor appetite, but was able to drink 3 Glucerna yesterday.   Assessment  & Plan :    Hospitalist consult progress note    1. HCAP. Could have aspirated when he was less responsive due to hypoglycemia, improved with empiric antibiotics which currently are vancomycin and Zosyn, clinically much better, leukocytosis could have been due to recent IV steroid exposure + infection. - Continue Augmentin for one more day on discharge, continue probiotics , and will  discharge him with incentive spirometry and flutter valve.    2. DM type II with hypoglycemia.  - Patient is on large dose Novolin 70/30 at home (50-75 units BID), with recurrent episodes of hypoglycemia during hospital stay, requiring much lower dose, I think this is most likely related to his poor appetite, but it seems is picking up, he drank 3 Glucerna yesterday, would discharge on Novolin 70/30 , 15 units Qam, Novolin 70/30 10 units QPM , and insulin sliding scale.  Lab Results  Component Value Date   HGBA1C  7.4* 08/04/2015   CBG (last 3)   Recent Labs  08/08/15 1639 08/08/15 2139 08/09/15 0614  GLUCAP 194* 237* 207*    3.History of CAD status post CABG. Currently chest pain-free, cardiology following, continue aspirin, statin and beta blocker for secondary prevention. Troponin mildly elevated but flat trend and with non-ACS pattern. Cardiology on board. Likely mild demand ischemia from pneumonia.  4. Polymyalgia rheumatica. Chronically on prednisone which has been resumed,  5. ARF on CKD stage IV. Baseline creatinine anywhere between 1.7 and 1.9. ARB was discontinued on 08/04/2015, at baseline now, continue to hold ARB on discharge.  6. Lumbar discectomy. Defer management to primary team which is neurosurgery.  7. Dyslipidemia. Continue home dose statin.  8. Gout. On home dose allopurinol.  9. BPH with history of urinary retention requiring self cath intermittently. On Flomax.      DVT Prophylaxis  : Have ordered SCDs, chemical prophylaxis deferred to primary team which is neurosurgery    Lab Results  Component Value Date   PLT 292 08/06/2015    Inpatient Medications  Scheduled Meds: . acidophilus  2 capsule Oral Daily  . allopurinol  300 mg Oral Q lunch  . amLODipine  10 mg Oral Q supper  . amoxicillin-clavulanate  1 tablet Oral Q12H  . aspirin EC  81 mg Oral Daily  . atorvastatin  40 mg Oral q1800  . bisacodyl  10 mg Rectal Daily  . budesonide (PULMICORT)  nebulizer solution  0.25 mg Nebulization BID  . calcitRIOL  0.25 mcg Oral Q M,W,F  . cycloSPORINE  1 drop Both Eyes BID  . docusate sodium  200 mg Oral BID  . feeding supplement (GLUCERNA SHAKE)  237 mL Oral TID BM  . finasteride  5 mg Oral QHS  . gabapentin  300 mg Oral TID  . insulin aspart  0-20 Units Subcutaneous TID WC  . insulin aspart  0-5 Units Subcutaneous QHS  . insulin aspart  5 Units Subcutaneous TID WC  . insulin aspart protamine- aspart  10 Units Subcutaneous BID WC  . ipratropium-albuterol  3 mL Nebulization BID  . Liraglutide  1.2 mg Subcutaneous Q1500  . magic mouthwash  5 mL Oral QID  . metoprolol  50 mg Oral BID  . montelukast  10 mg Oral QHS  . multivitamin  2 tablet Oral BID  . pantoprazole  40 mg Oral Daily  . pilocarpine  1 drop Both Eyes TID  . polyethylene glycol  17 g Oral BID  . predniSONE  7 mg Oral Q breakfast  . senna  1 tablet Oral Daily  . sucralfate  1 g Oral TID WC & HS  . tamsulosin  0.4 mg Oral QHS   Continuous Infusions:  PRN Meds:.acetaminophen **OR** [DISCONTINUED] acetaminophen, alum & mag hydroxide-simeth, diazepam, ipratropium-albuterol, [DISCONTINUED] ondansetron **OR** ondansetron (ZOFRAN) IV, oxyCODONE, phenol, senna-docusate, sodium chloride flush, sodium phosphate, tiZANidine  Antibiotics  :    Anti-infectives    Start     Dose/Rate Route Frequency Ordered Stop   08/06/15 1000  amoxicillin-clavulanate (AUGMENTIN) 500-125 MG per tablet 500 mg     1 tablet Oral Every 12 hours 08/06/15 0939     08/04/15 1200  vancomycin (VANCOCIN) 1,250 mg in sodium chloride 0.9 % 250 mL IVPB  Status:  Discontinued     1,250 mg 166.7 mL/hr over 90 Minutes Intravenous Every 24 hours 08/03/15 1152 08/06/15 0939   08/03/15 2200  piperacillin-tazobactam (ZOSYN) IVPB 3.375 g  Status:  Discontinued  3.375 g 12.5 mL/hr over 240 Minutes Intravenous Every 8 hours 08/03/15 1152 08/06/15 0939   08/03/15 1200  levofloxacin (LEVAQUIN) IVPB 500 mg  Status:   Discontinued     500 mg 100 mL/hr over 60 Minutes Intravenous Every 24 hours 08/03/15 1130 08/04/15 0807   08/03/15 1200  vancomycin (VANCOCIN) 2,000 mg in sodium chloride 0.9 % 500 mL IVPB     2,000 mg 250 mL/hr over 120 Minutes Intravenous  Once 08/03/15 1152 08/03/15 1436   08/03/15 1200  piperacillin-tazobactam (ZOSYN) IVPB 3.375 g     3.375 g 100 mL/hr over 30 Minutes Intravenous  Once 08/03/15 1152 08/03/15 1306         Objective:   Filed Vitals:   08/09/15 0515 08/09/15 0925 08/09/15 0926 08/09/15 0938  BP: 141/70   120/44  Pulse: 90   79  Temp: 98.9 F (37.2 C)   98.5 F (36.9 C)  TempSrc: Oral   Oral  Resp: 20   20  Height:      Weight:      SpO2: 92% 97% 97% 98%    Wt Readings from Last 3 Encounters:  07/24/15 105.824 kg (233 lb 4.8 oz)  03/27/15 109.317 kg (241 lb)  11/28/14 107.956 kg (238 lb)    No intake or output data in the 24 hours ending 08/09/15 1037   Physical Exam  Awake Alert, Oriented X 3,  Supple Neck,No JVD,  Symmetrical Chest wall movement, Good air movement bilaterally,  RRR,No Gallops,Rubs or new Murmurs,   +ve B.Sounds, Abd Soft, No tenderness, No organomegaly appriciated, No rebound - guarding or rigidity.  No Cyanosis, Clubbing or edema, No new Rash or bruise      Data Review:    CBC  Recent Labs Lab 08/03/15 1030 08/04/15 0635 08/05/15 0512 08/06/15 0445  WBC 12.9* 11.1* 19.1* 15.8*  HGB 11.6* 10.2* 10.0* 9.5*  HCT 37.1* 32.3* 31.2* 29.9*  PLT 249 241 256 292  MCV 95.1 92.8 92.9 92.9  MCH 29.7 29.3 29.8 29.5  MCHC 31.3 31.6 32.1 31.8  RDW 15.3 15.0 14.7 14.8  LYMPHSABS 1.0 0.5*  --   --   MONOABS 0.9 0.1  --   --   EOSABS 0.3 0.0  --   --   BASOSABS 0.0 0.0  --   --     Chemistries   Recent Labs Lab 08/03/15 1030 08/04/15 0635 08/05/15 0512 08/06/15 0445 08/07/15 0604  NA 140 135 136 140 140  K 3.9 4.5 3.9 3.6 3.5  CL 103 97* 100* 104 101  CO2 28 26 28 30 30   GLUCOSE 135* 393* 273* 106* 91  BUN 19  33* 42* 37* 27*  CREATININE 1.73* 2.06* 2.02* 1.65* 1.56*  CALCIUM 8.3* 8.0* 8.2* 8.6* 8.9  AST 19 18  --   --   --   ALT 19 21  --   --   --   ALKPHOS 82 71  --   --   --   BILITOT 1.5* 1.6*  --   --   --    ------------------------------------------------------------------------------------------------------------------ No results for input(s): CHOL, HDL, LDLCALC, TRIG, CHOLHDL, LDLDIRECT in the last 72 hours.  Lab Results  Component Value Date   HGBA1C 7.4* 08/04/2015   ------------------------------------------------------------------------------------------------------------------ No results for input(s): TSH, T4TOTAL, T3FREE, THYROIDAB in the last 72 hours.  Invalid input(s): FREET3 ------------------------------------------------------------------------------------------------------------------ No results for input(s): VITAMINB12, FOLATE, FERRITIN, TIBC, IRON, RETICCTPCT in the last 72 hours.  Coagulation profile No results  for input(s): INR, PROTIME in the last 168 hours.  No results for input(s): DDIMER in the last 72 hours.  Cardiac Enzymes  Recent Labs Lab 08/03/15 1203 08/03/15 1830 08/04/15 0032  TROPONINI 0.04* 0.42* 0.32*   ------------------------------------------------------------------------------------------------------------------    Component Value Date/Time   BNP 265.8* 08/03/2015 1203    Micro Results Recent Results (from the past 240 hour(s))  Culture, blood (routine x 2)     Status: None   Collection Time: 08/03/15 12:05 PM  Result Value Ref Range Status   Specimen Description BLOOD LEFT ANTECUBITAL  Final   Special Requests BOTTLES DRAWN AEROBIC AND ANAEROBIC 10CC  Final   Culture NO GROWTH 5 DAYS  Final   Report Status 08/08/2015 FINAL  Final  Culture, blood (routine x 2)     Status: None   Collection Time: 08/03/15 12:12 PM  Result Value Ref Range Status   Specimen Description BLOOD RIGHT ANTECUBITAL  Final   Special Requests BOTTLES  DRAWN AEROBIC AND ANAEROBIC 10CC  Final   Culture NO GROWTH 5 DAYS  Final   Report Status 08/08/2015 FINAL  Final    Radiology Reports X-ray Chest Pa Or Ap  07/30/2015  CLINICAL DATA:  Central line placement EXAM: CHEST 1 VIEW COMPARISON:  07/25/2015 FINDINGS: Postoperative changes in the mediastinum. Placement of a right central venous catheter with tip over the upper SVC region. The catheter demonstrate somewhat lateral orientation but this is likely due to patient rotation. No pneumothorax. Cardiac enlargement without significant vascular congestion. No focal airspace disease or consolidation in the lungs. No blunting of costophrenic angles. Calcified and tortuous aorta. Calcified granuloma in the right lung base. IMPRESSION: Right central venous catheter projects over the upper SVC region. No pneumothorax. Cardiac enlargement. Electronically Signed   By: Lucienne Capers M.D.   On: 07/30/2015 21:57   Dg Chest 2 View  08/05/2015  CLINICAL DATA:  Community acquired pneumonia EXAM: CHEST  2 VIEW COMPARISON:  08/04/2015 FINDINGS: Cardiomegaly again noted. Right IJ central line is unchanged in position. Status post median sternotomy. There is patchy residual right perihilar asymmetric edema or infiltrate. Bony thorax is stable. Degenerative changes mid and lower thoracic spine. IMPRESSION: Right perihilar residual patchy asymmetric edema or infiltrate. No pulmonary edema. Status post CABG. Stable right IJ central line position PICC Electronically Signed   By: Lahoma Crocker M.D.   On: 08/05/2015 13:15   Dg Lumbar Spine 2-3 Views  07/30/2015  CLINICAL DATA:  Lumbar laminectomy and decompression microdiskectomy L1 level EXAM: LUMBAR SPINE - 2-3 VIEW COMPARISON:  Portable exam 1852 hours labeled as #1 is compared to MR of 07/24/2015 FINDINGS: No numbering system is identified on MR. Current cross-table lateral radiographic exam is therefore labeled assuming 5 lumbar vertebra. This numbering system should be  correlated with any numbering system utilized on previous imaging to ensure consistency. Metallic probe via dorsal approach projects dorsal to the L4-L5 disc space at the inferior margin of the L4 spinous process. IMPRESSION: Dorsal localization of the inferior aspect of the spinous process of L4, dorsal to the L4-L5 disc space level. Electronically Signed   By: Lavonia Dana M.D.   On: 07/30/2015 21:55   Ct Head Wo Contrast  08/03/2015  CLINICAL DATA:  Is altered mental status.  Recent back surgery. EXAM: CT HEAD WITHOUT CONTRAST TECHNIQUE: Contiguous axial images were obtained from the base of the skull through the vertex without intravenous contrast. COMPARISON:  None. FINDINGS: Ventricles are normal in size, for this patient's age,  and normal in configuration. There are no parenchymal masses or mass effect. There is no evidence of a cortical infarct. Mild periventricular white matter hypoattenuation is noted consistent with chronic microvascular ischemic change. Small focal hypoattenuation area in the left base a ganglia suggests old lacune infarct. There are no extra-axial masses or abnormal fluid collections. There is no intracranial hemorrhage. Visualized sinuses and mastoid air cells are clear. IMPRESSION: 1. No acute intracranial abnormalities. 2. Age related volume loss. Mild chronic microvascular ischemic change. Electronically Signed   By: Lajean Manes M.D.   On: 08/03/2015 15:16   Ct Chest High Resolution  07/28/2015  CLINICAL DATA:  75 year old male with history of sarcoidosis and COPD. History of asthma. EXAM: CT CHEST WITHOUT CONTRAST TECHNIQUE: Multidetector CT imaging of the chest was performed following the standard protocol without intravenous contrast. High resolution imaging of the lungs, as well as inspiratory and expiratory imaging, was performed. COMPARISON:  Chest CT 06/20/2010. FINDINGS: Mediastinum/Lymph Nodes: Heart size is mildly enlarged. There is no significant pericardial fluid,  thickening or pericardial calcification. There is atherosclerosis of the thoracic aorta, the great vessels of the mediastinum and the coronary arteries, including calcified atherosclerotic plaque in the left main, left anterior descending, left circumflex and right coronary arteries. Status post median sternotomy for CABG, including LIMA to the LAD. No pathologically enlarged mediastinal or hilar lymph nodes. Please note that accurate exclusion of hilar adenopathy is limited on noncontrast CT scans. Esophagus is unremarkable in appearance. No axillary lymphadenopathy. Lungs/Pleura: Large calcified granuloma with surrounding tiny calcified granulomas in the base of the right lower lobe. High-resolution images demonstrate linear areas of architectural distortion in the dependent portions of the lower lobes of the lungs bilaterally which are new compared to the prior study. This is associated with some very mild cylindrical bronchiectasis in the basal segments of the lower lobes dependently. There are no other more widespread areas of ground-glass attenuation, subpleural reticulation, traction bronchiectasis or frank honeycombing. Inspiratory and expiratory imaging demonstrates mild air trapping, indicative of small airways disease. No acute consolidative airspace disease. No pleural effusions. No suspicious appearing pulmonary nodules or masses. Upper abdomen: A tiny calcified granuloma in the right lobe of the liver. Atherosclerosis. Musculoskeletal: There are no aggressive appearing lytic or blastic lesions noted in the visualized portions of the skeleton. IMPRESSION: 1. Interval development of what appear to be areas of post infectious or inflammatory scarring in the lower lobes of the lungs bilaterally. This is associated with some very mild cylindrical bronchiectasis in the basal segments of the lower lobes of the lungs as well. 2. The above imaging findings are not typical imaging manifestations of sarcoidosis.  Additionally, there is no lymphadenopathy or other typical manifestations of sarcoidosis in the thorax. 3. No evidence to suggest other interstitial lung disease. 4. Mild air trapping, indicative of mild small airways disease. 5. Small calcified granulomas in the right lower lobe and right lobe of the liver again noted. 6. Atherosclerosis, including left main and 3 vessel coronary artery disease. Status post median sternotomy for CABG, including LIMA to the LAD. Electronically Signed   By: Vinnie Langton M.D.   On: 07/28/2015 07:14   Dg Chest Port 1 View  08/04/2015  CLINICAL DATA:  Shortness of breath EXAM: PORTABLE CHEST 1 VIEW COMPARISON:  Chest radiograph from one day prior. FINDINGS: Right internal jugular central venous catheter terminates in the upper third of the superior vena cava. Sternotomy wires appear aligned and intact. CABG clips overlie the mediastinum. Stable  cardiomediastinal silhouette with mild cardiomegaly. No pneumothorax. No pleural effusion. Calcified right basilar granuloma is stable. Mild residual pulmonary edema, nearly resolved. Mild bibasilar atelectasis. IMPRESSION: 1. Stable mild cardiomegaly with nearly resolved mild residual pulmonary edema. 2. Mild bibasilar atelectasis. Electronically Signed   By: Ilona Sorrel M.D.   On: 08/04/2015 11:53   Dg Chest Port 1 View  08/03/2015  CLINICAL DATA:  Productive cough EXAM: PORTABLE CHEST 1 VIEW COMPARISON:  07/30/2015 FINDINGS: Cardiomegaly with pulmonary vascular congestion. Patchy opacities in the right upper lobe, left lower lobe, and right infrahilar region. Differential considerations include multifocal pneumonia versus asymmetric interstitial edema. No definite pleural effusions.  No pneumothorax. Postsurgical changes related to prior CABG. Right IJ venous catheter terminates in the mid SVC. Median sternotomy. IMPRESSION: Cardiomegaly with pulmonary vascular congestion. Multifocal patchy opacities, suspicious for multifocal  pneumonia, less likely asymmetric interstitial edema. Electronically Signed   By: Julian Hy M.D.   On: 08/03/2015 11:13   Dg Chest Port 1 View  07/25/2015  CLINICAL DATA:  Preop respiratory exam for lumbar spine disc herniation. Coronary artery disease, COPD, and chronic kidney disease. EXAM: PORTABLE CHEST 1 VIEW COMPARISON:  03/29/2014 FINDINGS: Mild cardiomegaly stable. No evidence pulmonary infiltrate or edema. No evidence pleural effusion. Tiny calcified granuloma in the right lung base remains stable. Prior CABG again noted. IMPRESSION: Stable mild cardiomegaly.  No active lung disease. Electronically Signed   By: Earle Gell M.D.   On: 07/25/2015 21:55   Mr Outside Films Spine  07/26/2015  This examination belongs to an outside facility and is stored here for comparison purposes only.  Contact the originating outside institution for any associated report or interpretation.   Time Spent in minutes  25 minutes   Lucille Witts M.D on 08/09/2015 at 10:37 AM  Between 7am to 7pm - Pager - [phone]  After 7pm go to [email or web address] - password [facility]  [facility] -  Office  [phone]

## 2015-08-09 NOTE — Discharge Summary (Signed)
Physician Discharge Summary  Patient ID: Jacob [name] MRN: [name] DOB/AGE: Aug 07, 1940 75 y.o.  Admit date: 07/24/2015 Discharge date: 08/09/2015  Admission Diagnoses:LumbAr stenosis, lumbAr hnp with radiculopathy  Discharge Diagnoses:  Active Problems:   HNP (herniated nucleus pulposus), lumbar   Preoperative respiratory examination   OSA (obstructive sleep apnea)   Renal insufficiency   Altered mental status   HCAP (healthcare-associated pneumonia)   History of COPD   Spinal stenosis of lumbar region with radiculopathy   Discharged Condition: fair  Hospital Course: Jacob [name] was transferred from [facility] secondary to weakness and pain in the lower extremities. He had been admitted there after falling in the bathtub the day of his admission and was evaluated with an MRI showing a large hnp at L3/4 and severe lumbar stenosis. He had a history of severe COPD, and multiple other medical problems. He was seen and evaluated by the pulmonary service, and cleared for surgery. He underwent aLUMBAR LAMINECTOMY DISCECTOMY L3/4 bilateral. His postoperative course was quite rocky. He was clearly improved neurologically but deteriorated overall. He was seen by Cardiology, and the medical service. He was placed back on steroids, developed pneumonia for which he was treated, and ARF. He is significantly better now. He is working with pt and ot. His wound is clean, dry, and without signs of infection. He can ambulate with assistance, but continues to need PT.    Treatments: surgery: LUMBAR LAMINECTOMY DISCECTOMY L3/4 bilateral   Discharge Exam: Blood pressure 137/52, pulse 85, temperature 98.6 F (37 C), temperature source Oral, resp. rate 20, height 5\' 11"  (1.803 m), weight 105.824 kg (233 lb 4.8 oz), SpO2 95 %. General appearance: alert, cooperative, appears older than stated age and mild distress Neurologic: Mental status: Alert, oriented, thought content appropriate Cranial  nerves: normal Motor: weak in the lower extremities 4/5  Disposition: 01-Home or Self Care Lumbar Herniated Nucleus Pulposus, Lumbar three-four    Medication List    STOP taking these medications        furosemide 20 MG tablet  Commonly known as:  LASIX     losartan 100 MG tablet  Commonly known as:  COZAAR      TAKE these medications        acetaminophen 325 MG tablet  Commonly known as:  TYLENOL  Take 2 tablets (650 mg total) by mouth every 6 (six) hours as needed for mild pain (or Fever >/= 101).     acidophilus Caps capsule  Take 2 capsules by mouth daily.     albuterol 108 (90 Base) MCG/ACT inhaler  Commonly known as:  PROVENTIL HFA;VENTOLIN HFA  Inhale 2 puffs into the lungs every 6 (six) hours as needed for wheezing or shortness of breath.     allopurinol 300 MG tablet  Commonly known as:  ZYLOPRIM  Take 300 mg by mouth daily with lunch.     amLODipine 10 MG tablet  Commonly known as:  NORVASC  Take 10 mg by mouth daily with supper.     amoxicillin-clavulanate 500-125 MG tablet  Commonly known as:  AUGMENTIN  Take 1 tablet (500 mg total) by mouth every 12 (twelve) hours. This take on 5/26, 5/27, then stop after that     aspirin EC 81 MG tablet  Take 81 mg by mouth daily with lunch.     budesonide-formoterol 160-4.5 MCG/ACT inhaler  Commonly known as:  SYMBICORT  Inhale 2 puffs into the lungs 2 (two) times daily.     calcitRIOL 0.25 MCG  capsule  Commonly known as:  ROCALTROL  Take 0.25 mcg by mouth every Monday, Wednesday, and Friday.     cyclobenzaprine 10 MG tablet  Commonly known as:  FLEXERIL  Take 1 tablet (10 mg total) by mouth 3 (three) times daily as needed for muscle spasms.     cycloSPORINE 0.05 % ophthalmic emulsion  Commonly known as:  RESTASIS  Place 1 drop into both eyes 2 (two) times daily.     docusate sodium 100 MG capsule  Commonly known as:  COLACE  Take 2 capsules (200 mg total) by mouth 2 (two) times daily.     feeding  supplement (GLUCERNA SHAKE) Liqd  Take 237 mLs by mouth 3 (three) times daily between meals.     finasteride 5 MG tablet  Commonly known as:  PROSCAR  Take 5 mg by mouth at bedtime.     gabapentin 300 MG capsule  Commonly known as:  NEURONTIN  Take 1 capsule (300 mg total) by mouth 3 (three) times daily. PATIENT NEEDS OFFICE VISIT FOR ADDITIONAL REFILLS     insulin aspart 100 UNIT/ML injection  Commonly known as:  novoLOG  Inject 0-20 Units into the skin 3 (three) times daily with meals.     insulin aspart protamine- aspart (70-30) 100 UNIT/ML injection  Commonly known as:  NOVOLOG MIX 70/30  Please take 15 units subcutaneous every morning, and 10 units subcutaneous every evening.     ipratropium-albuterol 0.5-2.5 (3) MG/3ML Soln  Commonly known as:  DUONEB  Take 3 mLs by nebulization every 6 (six) hours as needed (shortness of breath/wheezing). Reported on 03/27/2015     ipratropium-albuterol 0.5-2.5 (3) MG/3ML Soln  Commonly known as:  DUONEB  Take 3 mLs by nebulization every 6 (six) hours as needed.     metoprolol 50 MG tablet  Commonly known as:  LOPRESSOR  Take 1 tablet (50 mg total) by mouth 2 (two) times daily.     montelukast 10 MG tablet  Commonly known as:  SINGULAIR  Take 1 tablet (10 mg total) by mouth at bedtime.     oxyCODONE 5 MG immediate release tablet  Commonly known as:  Oxy IR/ROXICODONE  Take 1 tablet (5 mg total) by mouth every 4 (four) hours as needed for severe pain.     pilocarpine 2 % ophthalmic solution  Commonly known as:  PILOCAR  Place 1 drop into both eyes 3 (three) times daily.     potassium chloride SA 20 MEQ tablet  Commonly known as:  K-DUR,KLOR-CON  Take 20 mEq by mouth daily.     predniSONE 5 MG tablet  Commonly known as:  DELTASONE  Take 5 mg by mouth daily with breakfast. IN CONJUNCTION WITH TWO 1 MG TABLETS TO EQUAL A TOTAL OF 7 MILLIGRAMS     predniSONE 1 MG tablet  Commonly known as:  DELTASONE  Take 2 mg by mouth daily  with breakfast. IN CONJUNCTION WITH ONE 5 MG TABLET TO EQUAL A TOTAL OF 7 MILLIGRAMS     PRESERVISION AREDS 2 PO  Take 1 tablet by mouth 2 (two) times daily.     SIMBRINZA 1-0.2 % Susp  Generic drug:  Brinzolamide-Brimonidine  Place 1 drop into both eyes 2 (two) times daily.     simvastatin 80 MG tablet  Commonly known as:  ZOCOR  Take 40 mg by mouth daily with supper.     sodium chloride 0.9 % nebulizer solution  Take 3 mLs by nebulization every 6 (six) hours  as needed for wheezing.     tamsulosin 0.4 MG Caps capsule  Commonly known as:  FLOMAX  Take 0.4 mg by mouth at bedtime.     tiZANidine 4 MG capsule  Commonly known as:  ZANAFLEX  Take 4 mg by mouth 3 (three) times daily as needed for muscle spasms.     VICTOZA 18 MG/3ML Sopn  Generic drug:  Liraglutide  Inject 1.2 mg into the skin daily at 3 pm.           Follow-up Information    Follow up with Nithya Meriweather L, MD In 2 weeks.   Specialty:  Neurosurgery   Why:  after discharge from rehab. please call the office to make an appointment   Contact information:   [address] [place] [address] [phone]       Signed: Winfield Cunas 08/09/2015, 2:26 PM

## 2015-08-09 NOTE — Progress Notes (Signed)
Occupational Therapy Treatment Patient Details Name: BUCHANAN HOUSEN MRN: [id number] DOB: 13-Jan-1941 Today's Date: 08/09/2015    History of present illness ELYA REUTHER 75 y.o. male with severe pain and weakness bilaterally lower extremities. MRI shows severe lumbar stenosis and a large HNP at L3/4 bilaterally. Pt underwent L3/4 bilateral lumbar laminectomy on 5/16. Bout with AMS, fever 5/20; Noted pna on chest x-ray   OT comments  Pt making gradual progress toward OT goals; limited by pain and fatigue. Pt currently min guard initially with functional mobility, then min assist with bil LE fatigue. Pt able to complete grooming activities standing at the sink with min guard assist but required verbal cues throughout for posture and maintaining back precautions. D/c plan remains appropriate. Will continue to follow acutely.   Follow Up Recommendations  SNF;Supervision/Assistance - 24 hour    Equipment Recommendations  Other (comment) (TBD at SNF)    Recommendations for Other Services      Precautions / Restrictions Precautions Precautions: Fall;Back Precaution Booklet Issued: No Precaution Comments: Pt able to recall 2/3 back precautions; reviewed all. Difficulty maintaining precautions during functional activities; consistent VCs required. Restrictions Weight Bearing Restrictions: No       Mobility Bed Mobility               General bed mobility comments: Pt OOB in chair upon arrival.  Transfers Overall transfer level: Needs assistance Equipment used: Rolling walker (2 wheeled) Transfers: Sit to/from Stand Sit to Stand: Min guard         General transfer comment: Min guard for safety; no physical assist required. Sit to stand from chair x 1. VCs for hand placement.    Balance Overall balance assessment: Needs assistance Sitting-balance support: Feet supported;No upper extremity supported Sitting balance-Leahy Scale: Good     Standing balance support: No upper  extremity supported;During functional activity Standing balance-Leahy Scale: Fair                     ADL Overall ADL's : Needs assistance/impaired     Grooming: Min guard;Standing;Oral care;Wash/dry face Grooming Details (indicate cue type and reason): Cueing to maintain back precautions; pt bending with grooming activities, resting forearms on sink.                             Functional mobility during ADLs: Min guard;Minimal assistance;Rolling walker (Min guard initially, Min assist with fatigue) General ADL Comments: Continues to fatigue quickly with significant bil LE weakness with fatigue. Consistent cues required for back precautions during functional activities.      Vision                     Perception     Praxis      Cognition   Behavior During Therapy: WFL for tasks assessed/performed Overall Cognitive Status: Within Functional Limits for tasks assessed       Memory: Decreased recall of precautions               Extremity/Trunk Assessment               Exercises     Shoulder Instructions       General Comments      Pertinent Vitals/ Pain       Pain Assessment: 0-10 Pain Score: 7  Pain Location: back Pain Descriptors / Indicators: Aching;Grimacing Pain Intervention(s): Monitored during session;Patient requesting pain meds-RN notified  Home Living  Prior Functioning/Environment              Frequency Min 2X/week     Progress Toward Goals  OT Goals(current goals can now be found in the care plan section)  Progress towards OT goals: Progressing toward goals  Acute Rehab OT Goals Patient Stated Goal: d/c to SNF today OT Goal Formulation: With patient  Plan Discharge plan remains appropriate    Co-evaluation                 End of Session Equipment Utilized During Treatment: Gait belt;Rolling walker   Activity Tolerance Patient  limited by pain;Patient limited by fatigue   Patient Left in chair;with call bell/phone within reach   Nurse Communication Patient requests pain meds;Mobility status        Time: [id number] OT Time Calculation (min): 18 min  Charges: OT General Charges $OT Visit: 1 Procedure OT Treatments $Self Care/Home Management : 8-22 mins  Binnie Kand M.S., OTR/L Pager: [phone]  08/09/2015, 9:20 AM

## 2015-08-09 NOTE — Clinical Social Work Note (Addendum)
Patient to be d/c'ed today to [facility].  Patient and family agreeable to plans will transport via personal car RN to call report to [facility] [phone].  Evette Cristal, MSW, [place]

## 2015-08-09 NOTE — Discharge Instructions (Signed)
.  kc °

## 2015-08-09 NOTE — Progress Notes (Signed)
D/C orders received, pt for D/C'd to SNF today.  IV and telemetry D/C.  Rx and D/C instructions given with verbalized understanding.  Family at bedside to assist with D/C.  Staff brought pt downstairs via wheelchair.

## 2015-08-13 ENCOUNTER — Encounter: Payer: Self-pay | Admitting: Adult Health

## 2015-08-13 ENCOUNTER — Non-Acute Institutional Stay (SKILLED_NURSING_FACILITY): Payer: PPO | Admitting: Adult Health

## 2015-08-13 DIAGNOSIS — E1142 Type 2 diabetes mellitus with diabetic polyneuropathy: Secondary | ICD-10-CM

## 2015-08-13 DIAGNOSIS — M5416 Radiculopathy, lumbar region: Secondary | ICD-10-CM

## 2015-08-13 DIAGNOSIS — G4733 Obstructive sleep apnea (adult) (pediatric): Secondary | ICD-10-CM | POA: Diagnosis not present

## 2015-08-13 DIAGNOSIS — I25810 Atherosclerosis of coronary artery bypass graft(s) without angina pectoris: Secondary | ICD-10-CM | POA: Diagnosis not present

## 2015-08-13 DIAGNOSIS — N184 Chronic kidney disease, stage 4 (severe): Secondary | ICD-10-CM

## 2015-08-13 DIAGNOSIS — Z794 Long term (current) use of insulin: Secondary | ICD-10-CM

## 2015-08-13 DIAGNOSIS — M109 Gout, unspecified: Secondary | ICD-10-CM

## 2015-08-13 DIAGNOSIS — M353 Polymyalgia rheumatica: Secondary | ICD-10-CM

## 2015-08-13 DIAGNOSIS — R1084 Generalized abdominal pain: Secondary | ICD-10-CM | POA: Diagnosis not present

## 2015-08-13 DIAGNOSIS — M4806 Spinal stenosis, lumbar region: Secondary | ICD-10-CM

## 2015-08-13 DIAGNOSIS — N400 Enlarged prostate without lower urinary tract symptoms: Secondary | ICD-10-CM

## 2015-08-13 DIAGNOSIS — J449 Chronic obstructive pulmonary disease, unspecified: Secondary | ICD-10-CM | POA: Diagnosis not present

## 2015-08-13 DIAGNOSIS — E785 Hyperlipidemia, unspecified: Secondary | ICD-10-CM

## 2015-08-13 DIAGNOSIS — K5901 Slow transit constipation: Secondary | ICD-10-CM

## 2015-08-13 DIAGNOSIS — I10 Essential (primary) hypertension: Secondary | ICD-10-CM | POA: Diagnosis not present

## 2015-08-13 DIAGNOSIS — G629 Polyneuropathy, unspecified: Secondary | ICD-10-CM

## 2015-08-13 DIAGNOSIS — M48061 Spinal stenosis, lumbar region without neurogenic claudication: Secondary | ICD-10-CM

## 2015-08-13 DIAGNOSIS — E43 Unspecified severe protein-calorie malnutrition: Secondary | ICD-10-CM

## 2015-08-13 DIAGNOSIS — J189 Pneumonia, unspecified organism: Secondary | ICD-10-CM

## 2015-08-13 DIAGNOSIS — D62 Acute posthemorrhagic anemia: Secondary | ICD-10-CM

## 2015-08-13 DIAGNOSIS — R5381 Other malaise: Secondary | ICD-10-CM | POA: Diagnosis not present

## 2015-08-13 DIAGNOSIS — J45909 Unspecified asthma, uncomplicated: Secondary | ICD-10-CM

## 2015-08-13 DIAGNOSIS — E876 Hypokalemia: Secondary | ICD-10-CM

## 2015-08-13 NOTE — Progress Notes (Signed)
Patient ID: Jacob [name], male   DOB: 07-Sep-1940, 75 y.o.   MRN: [id number]    DATE:  08/13/2015   MRN:  [id number]  BIRTHDAY: 1941-01-29  Facility:  Nursing Home Location:  [place] Room Number: [id number]  LEVEL OF CARE:  SNF [phone])  Contact Information    Name Relation Home Work Mobile   [place] Spouse [phone]  [phone]   [facility] Daughter   [phone]   [place] Pierini Daughter   [phone]       Code Status History    Date Active Date Inactive Code Status Order ID Comments User Context   07/24/2015 11:27 PM 08/09/2015  6:22 PM Full Code [id number]  Ashok Pall, MD Inpatient    Advance Directive Documentation        Most Recent Value   Type of Advance Directive  Out of facility DNR (pink MOST or yellow form)   Pre-existing out of facility DNR order (yellow form or pink MOST form)     "MOST" Form in Place?         Chief Complaint  Patient presents with  . Hospitalization Follow-up    HISTORY OF PRESENT ILLNESS:  This is a 75 year old male who has been admitted to [facility] on 08/09/15 from [facility]. He has PMH of OSA, COPD, CAD, diabetes mellitus, neuropathy, PMR and CHF. He had a fall in the back tab and was transferred to [facility]. MRI showed a large herniated nucleus pulposus at L3-4 and severe lumbar stenosis. He was then transferred to [facility] secondary to weakness and pain in the lower extremities. He had lumbar laminectomy discectomy L3-4 bilateral on 07/30/15. His hospital stay was complicated by pneumonia for which she was placed on steroids and ARF.  He is seen in his room today and complained of not moving his bowels since Friday, 4 days ago. He verbalized that his NovoLog mix 70/30 dosage is not the one that he takes at home and wanted to be on his home dosage. He said that his pain is well controlled.  He has been admitted for a short-term rehabilitation.  PAST  MEDICAL HISTORY:  Past Medical History  Diagnosis Date  . Allergy   . Asthma   . Diabetes mellitus without complication ([place])   . Cataract   . Arthritis   . Polymyalgia rheumatica (HCC)     maintained on Prednisone, Plaquenil. Followed by rhuematology every 4 months/[name].  Marland Kitchen COPD (chronic obstructive pulmonary disease) ([place])   . Anemia   . Hypertension   . BPH (benign prostatic hyperplasia)   . Sleep apnea     CPAP   Trying to use  . Shortness of breath dyspnea     with exertion  . Anxiety   . Chronic kidney disease     chronic  kidney failure  kidney function at 42%  . Coronary artery disease     CABG  7 bypasses  . Hepatitis     many years ago  . Cancer of kidney [facility])      CURRENT MEDICATIONS: Reviewed  Patient's Medications  New Prescriptions   No medications on file  Previous Medications   ACETAMINOPHEN (TYLENOL) 325 MG TABLET    Take 2 tablets (650 mg total) by mouth every 6 (six) hours as needed for mild pain (or Fever >/= 101).   ACIDOPHILUS (RISAQUAD) CAPS CAPSULE    Take 2 capsules by mouth daily.   ALBUTEROL (PROVENTIL HFA;VENTOLIN  HFA) 108 (90 BASE) MCG/ACT INHALER    Inhale 2 puffs into the lungs every 6 (six) hours as needed for wheezing or shortness of breath.   ALLOPURINOL (ZYLOPRIM) 300 MG TABLET    Take 300 mg by mouth daily with lunch.   AMLODIPINE (NORVASC) 10 MG TABLET    Take 10 mg by mouth daily with supper.    ASPIRIN EC 81 MG TABLET    Take 81 mg by mouth daily with lunch.   BRINZOLAMIDE-BRIMONIDINE (SIMBRINZA) 1-0.2 % SUSP    Place 1 drop into both eyes 2 (two) times daily.    BUDESONIDE-FORMOTEROL (SYMBICORT) 160-4.5 MCG/ACT INHALER    Inhale 2 puffs into the lungs 2 (two) times daily.   CALCITRIOL (ROCALTROL) 0.25 MCG CAPSULE    Take 0.25 mcg by mouth every Monday, Wednesday, and Friday.   CYCLOBENZAPRINE (FLEXERIL) 10 MG TABLET    Take 1 tablet (10 mg total) by mouth 3 (three) times daily as needed for muscle spasms.   CYCLOSPORINE (RESTASIS)  0.05 % OPHTHALMIC EMULSION    Place 1 drop into both eyes 2 (two) times daily.   DOCUSATE SODIUM (COLACE) 100 MG CAPSULE    Take 100 mg by mouth 2 (two) times daily.   FEEDING SUPPLEMENT, GLUCERNA SHAKE, (GLUCERNA SHAKE) LIQD    Take 237 mLs by mouth 3 (three) times daily between meals.   FINASTERIDE (PROSCAR) 5 MG TABLET    Take 5 mg by mouth at bedtime.    GABAPENTIN (NEURONTIN) 300 MG CAPSULE    Take 1 capsule (300 mg total) by mouth 3 (three) times daily. PATIENT NEEDS OFFICE VISIT FOR ADDITIONAL REFILLS   INSULIN LISPRO (HUMALOG) 100 UNIT/ML INJECTION    Inject 0-6 Units into the skin 3 (three) times daily before meals. 0-200 = 0 units, 201-250 = 2 units, 251-300 = 4 units, 301-350 = 6 units, 350 or greater notify provider   IPRATROPIUM-ALBUTEROL (DUONEB) 0.5-2.5 (3) MG/3ML SOLN    Take 3 mLs by nebulization every 6 (six) hours as needed (shortness of breath/wheezing). Reported on 03/27/2015   LIRAGLUTIDE (VICTOZA) 18 MG/3ML SOPN    Inject 1.2 mg into the skin daily at 3 pm.    METOPROLOL (LOPRESSOR) 50 MG TABLET    Take 1 tablet (50 mg total) by mouth 2 (two) times daily.   MONTELUKAST (SINGULAIR) 10 MG TABLET    Take 1 tablet (10 mg total) by mouth at bedtime.   MULTIPLE VITAMINS-MINERALS (PRESERVISION AREDS 2 PO)    Take 1 tablet by mouth 2 (two) times daily.    OXYCODONE (OXY IR/ROXICODONE) 5 MG IMMEDIATE RELEASE TABLET    Take 1 tablet (5 mg total) by mouth every 4 (four) hours as needed for severe pain.   PILOCARPINE (PILOCAR) 2 % OPHTHALMIC SOLUTION    Place 1 drop into both eyes 3 (three) times daily.   POTASSIUM CHLORIDE SA (K-DUR,KLOR-CON) 20 MEQ TABLET    Take 20 mEq by mouth daily.   PREDNISONE (DELTASONE) 1 MG TABLET    Take 2 mg by mouth daily with breakfast. IN CONJUNCTION WITH ONE 5 MG TABLET TO EQUAL A TOTAL OF 7 MILLIGRAMS   PREDNISONE (DELTASONE) 5 MG TABLET    Take 5 mg by mouth daily with breakfast. IN CONJUNCTION WITH TWO 1 MG TABLETS TO EQUAL A TOTAL OF 7 MILLIGRAMS    SIMVASTATIN (ZOCOR) 80 MG TABLET    Take 80 mg by mouth daily with supper.    SODIUM CHLORIDE 0.9 % NEBULIZER SOLUTION  Take 3 mLs by nebulization every 6 (six) hours as needed for wheezing.   TAMSULOSIN HCL (FLOMAX) 0.4 MG CAPS    Take 0.4 mg by mouth at bedtime.    TIZANIDINE (ZANAFLEX) 4 MG CAPSULE    Take 4 mg by mouth 3 (three) times daily as needed for muscle spasms.  Modified Medications   No medications on file  Discontinued Medications   AMOXICILLIN-CLAVULANATE (AUGMENTIN) 500-125 MG TABLET    Take 1 tablet (500 mg total) by mouth every 12 (twelve) hours. This take on 5/26, 5/27, then stop after that   DOCUSATE SODIUM (COLACE) 100 MG CAPSULE    Take 2 capsules (200 mg total) by mouth 2 (two) times daily.   INSULIN ASPART (NOVOLOG) 100 UNIT/ML INJECTION    Inject 0-20 Units into the skin 3 (three) times daily with meals.   INSULIN ASPART PROTAMINE- ASPART (NOVOLOG MIX 70/30) (70-30) 100 UNIT/ML INJECTION    Please take 15 units subcutaneous every morning, and 10 units subcutaneous every evening.   IPRATROPIUM-ALBUTEROL (DUONEB) 0.5-2.5 (3) MG/3ML SOLN    Take 3 mLs by nebulization every 6 (six) hours as needed.   OXYCODONE-ACETAMINOPHEN (ROXICET) 5-325 MG TABLET    Take 1 tablet by mouth every 6 (six) hours as needed for severe pain.     Allergies  Allergen Reactions  . Ace Inhibitors Other (See Comments)    Probably nausea and vomiting per patient   . Actonel [Risedronate] Nausea And Vomiting  . Ciprocinonide [Fluocinolone] Other (See Comments)    Probably nausea and vomiting per patient  . Flunisolide Other (See Comments)    Probably nausea and vomiting per patient   . Metformin And Related Other (See Comments)    Probably nausea and vomiting per patient   . Sertraline Other (See Comments)    Probably nausea and vomiting per patient   . Sulindac Other (See Comments)    Probably nausea and vomiting per patient   . Terazosin Other (See Comments)    Probably nausea and  vomiting per patient      REVIEW OF SYSTEMS:  GENERAL: no change in appetite, no fatigue, no weight changes, no fever, chills or weakness EYES: Denies change in vision, dry eyes, eye pain, itching or discharge EARS: Denies change in hearing, ringing in ears, or earache NOSE: Denies nasal congestion or epistaxis MOUTH and THROAT: Denies oral discomfort, gingival pain or bleeding, pain from teeth or hoarseness   RESPIRATORY: no cough, SOB, DOE, wheezing, hemoptysis CARDIAC: no chest pain, or palpitations, +edema GI: no abdominal pain, diarrhea, heart burn, nausea or vomiting, +constipation GU: Denies dysuria, frequency, hematuria, incontinence, or discharge PSYCHIATRIC: Denies feeling of depression or anxiety. No report of hallucinations, insomnia, paranoia, or agitation    PHYSICAL EXAMINATION  GENERAL APPEARANCE: Well nourished. In no acute distress. Obese SKIN:  Midline lower back incision is covered with honeycomb dressing, dry, no redness HEAD: Normal in size and contour. No evidence of trauma EYES: Lids open and close normally. No blepharitis, entropion or ectropion. PERRL. Conjunctivae are clear and sclerae are white. Lenses are without opacity EARS: Pinnae are normal. Patient hears normal voice tunes of the examiner MOUTH and THROAT: Lips are without lesions. Oral mucosa is moist and without lesions. Tongue is normal in shape, size, and color and without lesions NECK: supple, trachea midline, no neck masses, no thyroid tenderness, no thyromegaly LYMPHATICS: no LAN in the neck, no supraclavicular LAN RESPIRATORY: breathing is even & unlabored, BS CTAB CARDIAC: RRR, no murmur,no extra  heart sounds, BLE edema 2+ GI: abdomen soft, normal BS, no masses, no tenderness, no hepatomegaly, no splenomegaly EXTREMITIES:  Able to move X 4 extremities PSYCHIATRIC: Alert and oriented X 3. Affect and behavior are appropriate  LABS/RADIOLOGY: Labs reviewed: Basic Metabolic Panel:  Recent  Labs  07/28/15 0405 07/29/15 0317 07/30/15 0635  08/05/15 0512 08/06/15 0445 08/07/15 0604  NA 146* 141 138  < > 136 140 140  K 4.0 4.1 4.5  < > 3.9 3.6 3.5  CL 110 107 100*  < > 100* 104 101  CO2 27 26 26   < > 28 30 30   GLUCOSE 55* 52* 310*  < > 273* 106* 91  BUN 33* 25* 26*  < > 42* 37* 27*  CREATININE 1.54* 1.49* 1.63*  < > 2.02* 1.65* 1.56*  CALCIUM 9.2 9.0 9.0  < > 8.2* 8.6* 8.9  MG 2.3 2.2 2.0  --   --   --   --   PHOS 3.1 3.0 3.1  --   --   --   --   < > = values in this interval not displayed. Liver Function Tests:  Recent Labs  07/25/15 0005 08/03/15 1030 08/04/15 0635  AST 23 19 18   ALT 17 19 21   ALKPHOS 73 82 71  BILITOT 1.9* 1.5* 1.6*  PROT 6.9 5.7* 5.0*  ALBUMIN 3.4* 2.4* 1.9*   CBC:  Recent Labs  07/30/15 0635  08/03/15 1030 08/04/15 0635 08/05/15 0512 08/06/15 0445  WBC 9.9  --  12.9* 11.1* 19.1* 15.8*  NEUTROABS 7.7  --  10.7* 10.5*  --   --   HGB 12.7*  < > 11.6* 10.2* 10.0* 9.5*  HCT 39.6  < > 37.1* 32.3* 31.2* 29.9*  MCV 95.7  --  95.1 92.8 92.9 92.9  PLT 190  --  249 241 256 292  < > = values in this interval not displayed.  Cardiac Enzymes:  Recent Labs  08/03/15 1203 08/03/15 1830 08/04/15 0032  TROPONINI 0.04* 0.42* 0.32*   CBG:  Recent Labs  08/08/15 2139 08/09/15 0614 08/09/15 1113  GLUCAP 237* 207* 135*      X-ray Chest Pa Or Ap  07/30/2015  CLINICAL DATA:  Central line placement EXAM: CHEST 1 VIEW COMPARISON:  07/25/2015 FINDINGS: Postoperative changes in the mediastinum. Placement of a right central venous catheter with tip over the upper SVC region. The catheter demonstrate somewhat lateral orientation but this is likely due to patient rotation. No pneumothorax. Cardiac enlargement without significant vascular congestion. No focal airspace disease or consolidation in the lungs. No blunting of costophrenic angles. Calcified and tortuous aorta. Calcified granuloma in the right lung base. IMPRESSION: Right central  venous catheter projects over the upper SVC region. No pneumothorax. Cardiac enlargement. Electronically Signed   By: Lucienne Capers M.D.   On: 07/30/2015 21:57   Dg Chest 2 View  08/05/2015  CLINICAL DATA:  Community acquired pneumonia EXAM: CHEST  2 VIEW COMPARISON:  08/04/2015 FINDINGS: Cardiomegaly again noted. Right IJ central line is unchanged in position. Status post median sternotomy. There is patchy residual right perihilar asymmetric edema or infiltrate. Bony thorax is stable. Degenerative changes mid and lower thoracic spine. IMPRESSION: Right perihilar residual patchy asymmetric edema or infiltrate. No pulmonary edema. Status post CABG. Stable right IJ central line position PICC Electronically Signed   By: Lahoma Crocker M.D.   On: 08/05/2015 13:15   Dg Lumbar Spine 2-3 Views  07/30/2015  CLINICAL DATA:  Lumbar laminectomy and  decompression microdiskectomy L1 level EXAM: LUMBAR SPINE - 2-3 VIEW COMPARISON:  Portable exam 1852 hours labeled as #1 is compared to MR of 07/24/2015 FINDINGS: No numbering system is identified on MR. Current cross-table lateral radiographic exam is therefore labeled assuming 5 lumbar vertebra. This numbering system should be correlated with any numbering system utilized on previous imaging to ensure consistency. Metallic probe via dorsal approach projects dorsal to the L4-L5 disc space at the inferior margin of the L4 spinous process. IMPRESSION: Dorsal localization of the inferior aspect of the spinous process of L4, dorsal to the L4-L5 disc space level. Electronically Signed   By: Lavonia Dana M.D.   On: 07/30/2015 21:55   Ct Head Wo Contrast  08/03/2015  CLINICAL DATA:  Is altered mental status.  Recent back surgery. EXAM: CT HEAD WITHOUT CONTRAST TECHNIQUE: Contiguous axial images were obtained from the base of the skull through the vertex without intravenous contrast. COMPARISON:  None. FINDINGS: Ventricles are normal in size, for this patient's age, and normal in  configuration. There are no parenchymal masses or mass effect. There is no evidence of a cortical infarct. Mild periventricular white matter hypoattenuation is noted consistent with chronic microvascular ischemic change. Small focal hypoattenuation area in the left base a ganglia suggests old lacune infarct. There are no extra-axial masses or abnormal fluid collections. There is no intracranial hemorrhage. Visualized sinuses and mastoid air cells are clear. IMPRESSION: 1. No acute intracranial abnormalities. 2. Age related volume loss. Mild chronic microvascular ischemic change. Electronically Signed   By: Lajean Manes M.D.   On: 08/03/2015 15:16   Ct Chest High Resolution  07/28/2015  CLINICAL DATA:  75 year old male with history of sarcoidosis and COPD. History of asthma. EXAM: CT CHEST WITHOUT CONTRAST TECHNIQUE: Multidetector CT imaging of the chest was performed following the standard protocol without intravenous contrast. High resolution imaging of the lungs, as well as inspiratory and expiratory imaging, was performed. COMPARISON:  Chest CT 06/20/2010. FINDINGS: Mediastinum/Lymph Nodes: Heart size is mildly enlarged. There is no significant pericardial fluid, thickening or pericardial calcification. There is atherosclerosis of the thoracic aorta, the great vessels of the mediastinum and the coronary arteries, including calcified atherosclerotic plaque in the left main, left anterior descending, left circumflex and right coronary arteries. Status post median sternotomy for CABG, including LIMA to the LAD. No pathologically enlarged mediastinal or hilar lymph nodes. Please note that accurate exclusion of hilar adenopathy is limited on noncontrast CT scans. Esophagus is unremarkable in appearance. No axillary lymphadenopathy. Lungs/Pleura: Large calcified granuloma with surrounding tiny calcified granulomas in the base of the right lower lobe. High-resolution images demonstrate linear areas of architectural  distortion in the dependent portions of the lower lobes of the lungs bilaterally which are new compared to the prior study. This is associated with some very mild cylindrical bronchiectasis in the basal segments of the lower lobes dependently. There are no other more widespread areas of ground-glass attenuation, subpleural reticulation, traction bronchiectasis or frank honeycombing. Inspiratory and expiratory imaging demonstrates mild air trapping, indicative of small airways disease. No acute consolidative airspace disease. No pleural effusions. No suspicious appearing pulmonary nodules or masses. Upper abdomen: A tiny calcified granuloma in the right lobe of the liver. Atherosclerosis. Musculoskeletal: There are no aggressive appearing lytic or blastic lesions noted in the visualized portions of the skeleton. IMPRESSION: 1. Interval development of what appear to be areas of post infectious or inflammatory scarring in the lower lobes of the lungs bilaterally. This is associated with some  very mild cylindrical bronchiectasis in the basal segments of the lower lobes of the lungs as well. 2. The above imaging findings are not typical imaging manifestations of sarcoidosis. Additionally, there is no lymphadenopathy or other typical manifestations of sarcoidosis in the thorax. 3. No evidence to suggest other interstitial lung disease. 4. Mild air trapping, indicative of mild small airways disease. 5. Small calcified granulomas in the right lower lobe and right lobe of the liver again noted. 6. Atherosclerosis, including left main and 3 vessel coronary artery disease. Status post median sternotomy for CABG, including LIMA to the LAD. Electronically Signed   By: Vinnie Langton M.D.   On: 07/28/2015 07:14   Dg Chest Port 1 View  08/04/2015  CLINICAL DATA:  Shortness of breath EXAM: PORTABLE CHEST 1 VIEW COMPARISON:  Chest radiograph from one day prior. FINDINGS: Right internal jugular central venous catheter terminates  in the upper third of the superior vena cava. Sternotomy wires appear aligned and intact. CABG clips overlie the mediastinum. Stable cardiomediastinal silhouette with mild cardiomegaly. No pneumothorax. No pleural effusion. Calcified right basilar granuloma is stable. Mild residual pulmonary edema, nearly resolved. Mild bibasilar atelectasis. IMPRESSION: 1. Stable mild cardiomegaly with nearly resolved mild residual pulmonary edema. 2. Mild bibasilar atelectasis. Electronically Signed   By: Ilona Sorrel M.D.   On: 08/04/2015 11:53   Dg Chest Port 1 View  08/03/2015  CLINICAL DATA:  Productive cough EXAM: PORTABLE CHEST 1 VIEW COMPARISON:  07/30/2015 FINDINGS: Cardiomegaly with pulmonary vascular congestion. Patchy opacities in the right upper lobe, left lower lobe, and right infrahilar region. Differential considerations include multifocal pneumonia versus asymmetric interstitial edema. No definite pleural effusions.  No pneumothorax. Postsurgical changes related to prior CABG. Right IJ venous catheter terminates in the mid SVC. Median sternotomy. IMPRESSION: Cardiomegaly with pulmonary vascular congestion. Multifocal patchy opacities, suspicious for multifocal pneumonia, less likely asymmetric interstitial edema. Electronically Signed   By: Julian Hy M.D.   On: 08/03/2015 11:13   Dg Chest Port 1 View  07/25/2015  CLINICAL DATA:  Preop respiratory exam for lumbar spine disc herniation. Coronary artery disease, COPD, and chronic kidney disease. EXAM: PORTABLE CHEST 1 VIEW COMPARISON:  03/29/2014 FINDINGS: Mild cardiomegaly stable. No evidence pulmonary infiltrate or edema. No evidence pleural effusion. Tiny calcified granuloma in the right lung base remains stable. Prior CABG again noted. IMPRESSION: Stable mild cardiomegaly.  No active lung disease. Electronically Signed   By: Earle Gell M.D.   On: 07/25/2015 21:55   Mr Outside Films Spine  07/26/2015  This examination belongs to an outside  facility and is stored here for comparison purposes only.  Contact the originating outside institution for any associated report or interpretation.   ASSESSMENT/PLAN:  Physical deconditioning - for rehabilitation  Lumbar stenosis with Herniated nucleus pulposus @ L3-4 S/P laminectomy and discectomy - for rehabilitation; continue tizanidine 4 mg 1 capsule by mouth 3 times a day when necessary for muscle spasm, discontinue Cyclobenzaprine ; oxycodone 5 mg IR 1 tab by mouth every 4 hours when necessary; follow-up with Dr. Ashok Pall, neurosurgeon  Gout - continue allopurinol 300 mg 1 tab by mouth daily  Hypertension - continue amlodipine 10 mg 1 tab by mouth daily, Lopressor 50 mg 1 tab by mouth twice a day  HCAP - recently finished Augmentin 500-125 mg 1 tab by mouth every 12 hours on 5/26 and 5/27  COPD - no SOB; continue albuterol 2 puffs in the lungs every 6 hours when necessary, Symbicort 160-4 0.5 g/ACT  inhaler 2 puffs into the lungs twice a day  Asthma - continue Singulair 10 mg 1 tab by mouth daily at bedtime  Hypokalemia - continue KCL 20 meq 1 tab by mouth daily Lab Results  Component Value Date   K 3.5 08/07/2015    Polymyalgia rheumatica - continue prednisone 5 mg + prednisone 2 mg = 7 mg PO daily  Hyperlipidemia - start Zocor 40 mg 1 tab by mouth daily at bedtime Lab Results  Component Value Date   CHOL 207* 01/24/2014   HDL 51 01/24/2014   LDLCALC 133* 01/24/2014   TRIG 114 01/24/2014   CHOLHDL 4.1 01/24/2014    BPH - continue Flomax 0.4 mg 1 capsule by mouth daily at bedtime and Proscar 5 mg 1 tab by mouth daily at bedtime  Diabetes mellitus, type II with neuropathy - continue Victoza 18 mg/3 mL inject 1.2 mg into the skin daily at 3 PM, change NovoLog mix 70/30 40 units subcutaneous every morning and supper and [place] sliding scale subcutaneous 3 times a day Lab Results  Component Value Date   HGBA1C 7.4* 08/04/2015    Constipation - start senna S2  tabs by mouth twice a day, MiraLAX 17 g by mouth twice a day, lactulose 10 g/15 mL give 20 g/30 mL by mouth twice a day 3 days then when necessary and discontinue Colace  Protein calorie malnutrition, severe - ALB 1.9; start Procel 2 scoops by mouth twice a day   Anemia, acute blood loss - hemoglobin 9.5; check CBC  Chronic kidney disease, stage IV - creatinine 1.56; check BMP; continue Rocaltrol 0.25 g 1 capsule by mouth every Monday, Wednesday and Friday  Neuropathy - continue Neurontin 300 mg 1 capsule by mouth 3 times a day  OSA - continue CPAP at at bedtime  CAD - stable; continue aspirin 81 mg daily      Goals of care:  Short-term rehabilitation     Durenda Age, NP [place] [phone]

## 2015-08-14 ENCOUNTER — Non-Acute Institutional Stay (SKILLED_NURSING_FACILITY): Payer: PPO | Admitting: Internal Medicine

## 2015-08-14 ENCOUNTER — Encounter: Payer: Self-pay | Admitting: Internal Medicine

## 2015-08-14 DIAGNOSIS — Z8709 Personal history of other diseases of the respiratory system: Secondary | ICD-10-CM

## 2015-08-14 DIAGNOSIS — M353 Polymyalgia rheumatica: Secondary | ICD-10-CM | POA: Diagnosis not present

## 2015-08-14 DIAGNOSIS — IMO0001 Reserved for inherently not codable concepts without codable children: Secondary | ICD-10-CM

## 2015-08-14 DIAGNOSIS — N400 Enlarged prostate without lower urinary tract symptoms: Secondary | ICD-10-CM | POA: Diagnosis not present

## 2015-08-14 DIAGNOSIS — G4733 Obstructive sleep apnea (adult) (pediatric): Secondary | ICD-10-CM | POA: Diagnosis not present

## 2015-08-14 DIAGNOSIS — E1322 Other specified diabetes mellitus with diabetic chronic kidney disease: Secondary | ICD-10-CM

## 2015-08-14 DIAGNOSIS — D62 Acute posthemorrhagic anemia: Secondary | ICD-10-CM

## 2015-08-14 DIAGNOSIS — J189 Pneumonia, unspecified organism: Secondary | ICD-10-CM

## 2015-08-14 DIAGNOSIS — G629 Polyneuropathy, unspecified: Secondary | ICD-10-CM | POA: Diagnosis not present

## 2015-08-14 DIAGNOSIS — M1A9XX0 Chronic gout, unspecified, without tophus (tophi): Secondary | ICD-10-CM

## 2015-08-14 DIAGNOSIS — R5381 Other malaise: Secondary | ICD-10-CM

## 2015-08-14 DIAGNOSIS — N184 Chronic kidney disease, stage 4 (severe): Secondary | ICD-10-CM

## 2015-08-14 DIAGNOSIS — N189 Chronic kidney disease, unspecified: Secondary | ICD-10-CM

## 2015-08-14 DIAGNOSIS — E876 Hypokalemia: Secondary | ICD-10-CM

## 2015-08-14 DIAGNOSIS — K5901 Slow transit constipation: Secondary | ICD-10-CM

## 2015-08-14 DIAGNOSIS — M5126 Other intervertebral disc displacement, lumbar region: Secondary | ICD-10-CM | POA: Diagnosis not present

## 2015-08-14 DIAGNOSIS — R1310 Dysphagia, unspecified: Secondary | ICD-10-CM

## 2015-08-14 DIAGNOSIS — E46 Unspecified protein-calorie malnutrition: Secondary | ICD-10-CM

## 2015-08-14 DIAGNOSIS — I129 Hypertensive chronic kidney disease with stage 1 through stage 4 chronic kidney disease, or unspecified chronic kidney disease: Secondary | ICD-10-CM

## 2015-08-14 DIAGNOSIS — D72829 Elevated white blood cell count, unspecified: Secondary | ICD-10-CM

## 2015-08-14 DIAGNOSIS — I10 Essential (primary) hypertension: Secondary | ICD-10-CM

## 2015-08-14 LAB — CBC AND DIFFERENTIAL
HCT: 33 % — AB (ref 41–53)
Hemoglobin: 10.1 g/dL — AB (ref 13.5–17.5)
Neutrophils Absolute: 6 /uL
Platelets: 341 10*3/uL (ref 150–399)
WBC: 8.6 10^3/mL

## 2015-08-14 LAB — BASIC METABOLIC PANEL
BUN: 19 mg/dL (ref 4–21)
Creatinine: 1.7 mg/dL — AB (ref 0.6–1.3)
Glucose: 86 mg/dL
Potassium: 4.6 mmol/L (ref 3.4–5.3)
Sodium: 142 mmol/L (ref 137–147)

## 2015-08-14 NOTE — Progress Notes (Signed)
LOCATION: [facility]  PCP: [name], MD   Code Status: DNR  Goals of care: Advanced Directive information Advanced Directives 08/13/2015  Does patient have an advance directive? Yes  Type of Advance Directive Out of facility DNR (pink MOST or yellow form)  Does patient want to make changes to advanced directive? No - Patient declined  Copy of advanced directive(s) in chart? Yes  Would patient like information on creating an advanced directive? -     Extended Emergency Contact Information Primary Emergency Contact: Rhines,Vivian Address: [address]          [place], [place] [address] [place] Phone: [phone] Mobile Phone: [phone] Relation: Spouse Secondary Emergency Contact: Dove,Debra Address: [address]          [place], [place] [address] [place] of [place] Mobile Phone: [phone] Relation: Daughter   Allergies  Allergen Reactions  . Ace Inhibitors Other (See Comments)    Probably nausea and vomiting per patient   . Actonel [Risedronate] Nausea And Vomiting  . Ciprocinonide [Fluocinolone] Other (See Comments)    Probably nausea and vomiting per patient  . Flunisolide Other (See Comments)    Probably nausea and vomiting per patient   . Metformin And Related Other (See Comments)    Probably nausea and vomiting per patient   . Sertraline Other (See Comments)    Probably nausea and vomiting per patient   . Sulindac Other (See Comments)    Probably nausea and vomiting per patient   . Terazosin Other (See Comments)    Probably nausea and vomiting per patient     Chief Complaint  Patient presents with  . New Admit To SNF    New Admission     HPI:  Patient is a 75 y.o. male seen today for short term rehabilitation post hospital admission from 07/24/15-08/09/15 post fall with large herniated nucleus pulposus at L3-4 with severe lumbar stenosis and radiculopathy. He underwent lumbar laminectomy and disectomy at L3-4.  Post operative course was complicated with pneumonia, COPD exacerbation and acute renal failure. He was started on antibiotics and steroids and his losartan and and lasix were discontinued. He has PMH of OSA, COPD, CAD, diabetes mellitus, neuropathy, PMR and CHF.    Review of Systems:  Constitutional: Negative for fever, chills, diaphoresis. Feels weak and tired.  HENT: Negative for headache, congestion, nasal discharge, hearing loss, sore throat. Positive for difficulty swallowing.   Eyes: Negative for blurred vision, double vision and discharge.  Respiratory: Negative for shortness of breath and wheezing. Positive for cough with yellow phlegm.   Cardiovascular: Negative for chest pain, palpitations, leg swelling.  Gastrointestinal: Negative for heartburn, nausea, vomiting, abdominal pain, diarrhea and constipation. Positive for poor appetite. Last bowel movement was yesterday.   Genitourinary: Negative for dysuria and flank pain. Self cath himself once a month.  Musculoskeletal: Negative for back pain, fall in the facility.  Skin: Negative for itching, rash.  Neurological: Positive for dizziness. Psychiatric/Behavioral: Negative for depression    Past Medical History  Diagnosis Date  . Allergy   . Asthma   . Diabetes mellitus without complication ([facility])   . Cataract   . Arthritis   . Polymyalgia rheumatica (H[facility])     maintained on Prednisone, Plaquenil. Followed by rhuematology every 4 months/[name].  Marland Kitchen COPD (chronic obstructive pulmonary disease) ([facility])   . Anemia   . Hypertension   . BPH (benign prostatic hyperplasia)   . Sleep apnea     CPAP  Trying to use  . Shortness of breath dyspnea     with exertion  . Anxiety   . Chronic kidney disease     chronic  kidney failure  kidney function at 42%  . Coronary artery disease     CABG  7 bypasses  . Hepatitis     many years ago  . Cancer of kidney [facility])    Past Surgical History  Procedure Laterality Date  . Appendectomy    .  Hernia repair    . Prostate surgery      TURP at [facility].  Marland Kitchen Coronary artery bypass graft  03/17/1995    Wynonia Lawman; followed every six months.  . Cataract extraction w/phaco Right 11/28/2014    Procedure: CATARACT EXTRACTION PHACO AND INTRAOCULAR LENS PLACEMENT (IOC) RIGHT ;  Surgeon: Marylynn Pearson, MD;  Location: [facility];  Service: Ophthalmology;  Laterality: Right;  . Lumbar laminectomy/decompression microdiscectomy N/A 07/30/2015    Procedure: LUMBAR LAMINECTOMY DISCECTOMY ;  Surgeon: Ashok Pall, MD;  Location: [facility];  Service: Neurosurgery;  Laterality: N/A;  LUMBAR LAMINECTOMY DISCECTOMY    Social History:   reports that he quit smoking about 58 years ago. He has never used smokeless tobacco. He reports that he does not drink alcohol or use illicit drugs.  No family history on file.  Medications:   Medication List       This list is accurate as of: 08/14/15  2:39 PM.  Always use your most recent med list.               acetaminophen 325 MG tablet  Commonly known as:  TYLENOL  Take 2 tablets (650 mg total) by mouth every 6 (six) hours as needed for mild pain (or Fever >/= 101).     acidophilus Caps capsule  Take 2 capsules by mouth daily.     albuterol 108 (90 Base) MCG/ACT inhaler  Commonly known as:  PROVENTIL HFA;VENTOLIN HFA  Inhale 2 puffs into the lungs every 6 (six) hours as needed for wheezing or shortness of breath.     allopurinol 300 MG tablet  Commonly known as:  ZYLOPRIM  Take 300 mg by mouth daily with lunch.     amLODipine 10 MG tablet  Commonly known as:  NORVASC  Take 10 mg by mouth daily with supper.     aspirin EC 81 MG tablet  Take 81 mg by mouth daily with lunch.     bisacodyl 10 MG suppository  Commonly known as:  DULCOLAX  Place 10 mg rectally as needed for moderate constipation (Stop date 08/15/15).     budesonide-formoterol 160-4.5 MCG/ACT inhaler  Commonly known as:  SYMBICORT  Inhale 2 puffs into the lungs 2 (two) times daily.      calcitRIOL 0.25 MCG capsule  Commonly known as:  ROCALTROL  Take 0.25 mcg by mouth every Monday, Wednesday, and Friday.     cyclobenzaprine 10 MG tablet  Commonly known as:  FLEXERIL  Take 1 tablet (10 mg total) by mouth 3 (three) times daily as needed for muscle spasms.     cycloSPORINE 0.05 % ophthalmic emulsion  Commonly known as:  RESTASIS  Place 1 drop into both eyes 2 (two) times daily.     feeding supplement (GLUCERNA SHAKE) Liqd  Take 237 mLs by mouth 3 (three) times daily between meals.     finasteride 5 MG tablet  Commonly known as:  PROSCAR  Take 5 mg by mouth at bedtime.  gabapentin 300 MG capsule  Commonly known as:  NEURONTIN  Take 1 capsule (300 mg total) by mouth 3 (three) times daily. PATIENT NEEDS OFFICE VISIT FOR ADDITIONAL REFILLS     HUMALOG 100 UNIT/ML injection  Generic drug:  insulin lispro  Inject 20 Units into the skin 3 (three) times daily before meals. 0-200 = 0 units, 201-250 = 2 units, 251-300 = 4 units, 301-350 = 6 units, 350 or greater notify provider     insulin aspart protamine- aspart (70-30) 100 UNIT/ML injection  Commonly known as:  NOVOLOG MIX 70/30  Inject 40 Units into the skin daily with breakfast.     ipratropium-albuterol 0.5-2.5 (3) MG/3ML Soln  Commonly known as:  DUONEB  Take 3 mLs by nebulization every 6 (six) hours as needed (shortness of breath/wheezing). Reported on 03/27/2015     lactulose 10 GM/15ML solution  Commonly known as:  CHRONULAC  Take by mouth 2 (two) times daily. Take 30 mL     magnesium hydroxide 400 MG/5ML suspension  Commonly known as:  MILK OF MAGNESIA  Take 45 mLs by mouth daily as needed for mild constipation (Stop date 08/15/15).     metoprolol 50 MG tablet  Commonly known as:  LOPRESSOR  Take 1 tablet (50 mg total) by mouth 2 (two) times daily.     montelukast 10 MG tablet  Commonly known as:  SINGULAIR  Take 1 tablet (10 mg total) by mouth at bedtime.     oxyCODONE 5 MG immediate release tablet    Commonly known as:  Oxy IR/ROXICODONE  Take 1 tablet (5 mg total) by mouth every 4 (four) hours as needed for severe pain.     pilocarpine 2 % ophthalmic solution  Commonly known as:  PILOCAR  Place 1 drop into both eyes 3 (three) times daily.     polyethylene glycol packet  Commonly known as:  MIRALAX / GLYCOLAX  Take 17 g by mouth 2 (two) times daily.     potassium chloride SA 20 MEQ tablet  Commonly known as:  K-DUR,KLOR-CON  Take 20 mEq by mouth daily.     predniSONE 5 MG tablet  Commonly known as:  DELTASONE  Take 5 mg by mouth daily with breakfast. IN CONJUNCTION WITH TWO 1 MG TABLETS TO EQUAL A TOTAL OF 7 MILLIGRAMS     predniSONE 1 MG tablet  Commonly known as:  DELTASONE  Take 2 mg by mouth daily with breakfast. IN CONJUNCTION WITH ONE 5 MG TABLET TO EQUAL A TOTAL OF 7 MILLIGRAMS     PRESERVISION AREDS 2 PO  Take 1 tablet by mouth 2 (two) times daily.     PROCEL Powd  Take 2 scoop by mouth 2 (two) times daily.     senna 8.6 MG tablet  Commonly known as:  SENOKOT  Take 2 tablets by mouth 2 (two) times daily.     SIMBRINZA 1-0.2 % Susp  Generic drug:  Brinzolamide-Brimonidine  Place 1 drop into both eyes 2 (two) times daily.     simvastatin 40 MG tablet  Commonly known as:  ZOCOR  Take 40 mg by mouth daily.     sodium chloride 0.9 % nebulizer solution  Take 3 mLs by nebulization every 6 (six) hours as needed for wheezing.     tamsulosin 0.4 MG Caps capsule  Commonly known as:  FLOMAX  Take 0.4 mg by mouth at bedtime.     tiZANidine 4 MG capsule  Commonly known as:  ZANAFLEX  Take 4 mg by mouth 3 (three) times daily as needed for muscle spasms.     VICTOZA 18 MG/3ML Sopn  Generic drug:  Liraglutide  Inject 1.2 mg into the skin daily at 3 pm.        Immunizations: Immunization History  Administered Date(s) Administered  . PPD Test 08/09/2015  . Pneumococcal Conjugate-13 01/24/2014     Physical Exam: Filed Vitals:   08/14/15 1425  BP: 141/64   Pulse: 80  Temp: 98.7 F (37.1 C)  TempSrc: Oral  Resp: 20  Height: 5\' 11"  (1.803 m)  Weight: 233 lb (105.688 kg)  SpO2: 96%   Body mass index is 32.51 kg/(m^2).  General- elderly male, obese, in no acute distress Head- normocephalic, atraumatic Nose- no maxillary or frontal sinus tenderness, no nasal discharge Throat- moist mucus membrane  Eyes- PERRLA, EOMI, no pallor, no icterus, no discharge, normal conjunctiva, normal sclera Neck- no cervical lymphadenopathy Cardiovascular- normal s1,s2, no murmur, trace leg edema Respiratory- bilateral poor air movement, + wheeze, no rhonchi, no crackles, no use of accessory muscles Abdomen- bowel sounds present, soft, non tender Musculoskeletal- able to move all 4 extremities, generalized weakness Neurological- alert and oriented to person, place and time Skin- warm and dry, surgical incision to lumbar region with honeycomb dressing Psychiatry- normal mood and affect    Labs reviewed: Basic Metabolic Panel:  Recent Labs  07/28/15 0405 07/29/15 0317 07/30/15 0635  08/05/15 0512 08/06/15 0445 08/07/15 0604  NA 146* 141 138  < > 136 140 140  K 4.0 4.1 4.5  < > 3.9 3.6 3.5  CL 110 107 100*  < > 100* 104 101  CO2 27 26 26   < > 28 30 30   GLUCOSE 55* 52* 310*  < > 273* 106* 91  BUN 33* 25* 26*  < > 42* 37* 27*  CREATININE 1.54* 1.49* 1.63*  < > 2.02* 1.65* 1.56*  CALCIUM 9.2 9.0 9.0  < > 8.2* 8.6* 8.9  MG 2.3 2.2 2.0  --   --   --   --   PHOS 3.1 3.0 3.1  --   --   --   --   < > = values in this interval not displayed. Liver Function Tests:  Recent Labs  07/25/15 0005 08/03/15 1030 08/04/15 0635  AST 23 19 18   ALT 17 19 21   ALKPHOS 73 82 71  BILITOT 1.9* 1.5* 1.6*  PROT 6.9 5.7* 5.0*  ALBUMIN 3.4* 2.4* 1.9*   No results for input(s): LIPASE, AMYLASE in the last 8760 hours. No results for input(s): AMMONIA in the last 8760 hours. CBC:  Recent Labs  07/30/15 0635  08/03/15 1030 08/04/15 0635 08/05/15 0512  08/06/15 0445  WBC 9.9  --  12.9* 11.1* 19.1* 15.8*  NEUTROABS 7.7  --  10.7* 10.5*  --   --   HGB 12.7*  < > 11.6* 10.2* 10.0* 9.5*  HCT 39.6  < > 37.1* 32.3* 31.2* 29.9*  MCV 95.7  --  95.1 92.8 92.9 92.9  PLT 190  --  249 241 256 292  < > = values in this interval not displayed. Cardiac Enzymes:  Recent Labs  08/03/15 1203 08/03/15 1830 08/04/15 0032  TROPONINI 0.04* 0.42* 0.32*   BNP: Invalid input(s): POCBNP CBG:  Recent Labs  08/08/15 2139 08/09/15 0614 08/09/15 1113  GLUCAP 237* 207* 135*    Radiological Exams: X-ray Chest Pa Or Ap  07/30/2015  CLINICAL DATA:  Central line placement EXAM: CHEST  1 VIEW COMPARISON:  07/25/2015 FINDINGS: Postoperative changes in the mediastinum. Placement of a right central venous catheter with tip over the upper SVC region. The catheter demonstrate somewhat lateral orientation but this is likely due to patient rotation. No pneumothorax. Cardiac enlargement without significant vascular congestion. No focal airspace disease or consolidation in the lungs. No blunting of costophrenic angles. Calcified and tortuous aorta. Calcified granuloma in the right lung base. IMPRESSION: Right central venous catheter projects over the upper SVC region. No pneumothorax. Cardiac enlargement. Electronically Signed   By: Lucienne Capers M.D.   On: 07/30/2015 21:57   Dg Chest 2 View  08/05/2015  CLINICAL DATA:  Community acquired pneumonia EXAM: CHEST  2 VIEW COMPARISON:  08/04/2015 FINDINGS: Cardiomegaly again noted. Right IJ central line is unchanged in position. Status post median sternotomy. There is patchy residual right perihilar asymmetric edema or infiltrate. Bony thorax is stable. Degenerative changes mid and lower thoracic spine. IMPRESSION: Right perihilar residual patchy asymmetric edema or infiltrate. No pulmonary edema. Status post CABG. Stable right IJ central line position PICC Electronically Signed   By: Lahoma Crocker M.D.   On: 08/05/2015 13:15    Dg Lumbar Spine 2-3 Views  07/30/2015  CLINICAL DATA:  Lumbar laminectomy and decompression microdiskectomy L1 level EXAM: LUMBAR SPINE - 2-3 VIEW COMPARISON:  Portable exam 1852 hours labeled as #1 is compared to MR of 07/24/2015 FINDINGS: No numbering system is identified on MR. Current cross-table lateral radiographic exam is therefore labeled assuming 5 lumbar vertebra. This numbering system should be correlated with any numbering system utilized on previous imaging to ensure consistency. Metallic probe via dorsal approach projects dorsal to the L4-L5 disc space at the inferior margin of the L4 spinous process. IMPRESSION: Dorsal localization of the inferior aspect of the spinous process of L4, dorsal to the L4-L5 disc space level. Electronically Signed   By: Lavonia Dana M.D.   On: 07/30/2015 21:55   Ct Head Wo Contrast  08/03/2015  CLINICAL DATA:  Is altered mental status.  Recent back surgery. EXAM: CT HEAD WITHOUT CONTRAST TECHNIQUE: Contiguous axial images were obtained from the base of the skull through the vertex without intravenous contrast. COMPARISON:  None. FINDINGS: Ventricles are normal in size, for this patient's age, and normal in configuration. There are no parenchymal masses or mass effect. There is no evidence of a cortical infarct. Mild periventricular white matter hypoattenuation is noted consistent with chronic microvascular ischemic change. Small focal hypoattenuation area in the left base a ganglia suggests old lacune infarct. There are no extra-axial masses or abnormal fluid collections. There is no intracranial hemorrhage. Visualized sinuses and mastoid air cells are clear. IMPRESSION: 1. No acute intracranial abnormalities. 2. Age related volume loss. Mild chronic microvascular ischemic change. Electronically Signed   By: Lajean Manes M.D.   On: 08/03/2015 15:16   Ct Chest High Resolution  07/28/2015  CLINICAL DATA:  75 year old male with history of sarcoidosis and COPD.  History of asthma. EXAM: CT CHEST WITHOUT CONTRAST TECHNIQUE: Multidetector CT imaging of the chest was performed following the standard protocol without intravenous contrast. High resolution imaging of the lungs, as well as inspiratory and expiratory imaging, was performed. COMPARISON:  Chest CT 06/20/2010. FINDINGS: Mediastinum/Lymph Nodes: Heart size is mildly enlarged. There is no significant pericardial fluid, thickening or pericardial calcification. There is atherosclerosis of the thoracic aorta, the great vessels of the mediastinum and the coronary arteries, including calcified atherosclerotic plaque in the left main, left anterior descending, left circumflex and  right coronary arteries. Status post median sternotomy for CABG, including LIMA to the LAD. No pathologically enlarged mediastinal or hilar lymph nodes. Please note that accurate exclusion of hilar adenopathy is limited on noncontrast CT scans. Esophagus is unremarkable in appearance. No axillary lymphadenopathy. Lungs/Pleura: Large calcified granuloma with surrounding tiny calcified granulomas in the base of the right lower lobe. High-resolution images demonstrate linear areas of architectural distortion in the dependent portions of the lower lobes of the lungs bilaterally which are new compared to the prior study. This is associated with some very mild cylindrical bronchiectasis in the basal segments of the lower lobes dependently. There are no other more widespread areas of ground-glass attenuation, subpleural reticulation, traction bronchiectasis or frank honeycombing. Inspiratory and expiratory imaging demonstrates mild air trapping, indicative of small airways disease. No acute consolidative airspace disease. No pleural effusions. No suspicious appearing pulmonary nodules or masses. Upper abdomen: A tiny calcified granuloma in the right lobe of the liver. Atherosclerosis. Musculoskeletal: There are no aggressive appearing lytic or blastic  lesions noted in the visualized portions of the skeleton. IMPRESSION: 1. Interval development of what appear to be areas of post infectious or inflammatory scarring in the lower lobes of the lungs bilaterally. This is associated with some very mild cylindrical bronchiectasis in the basal segments of the lower lobes of the lungs as well. 2. The above imaging findings are not typical imaging manifestations of sarcoidosis. Additionally, there is no lymphadenopathy or other typical manifestations of sarcoidosis in the thorax. 3. No evidence to suggest other interstitial lung disease. 4. Mild air trapping, indicative of mild small airways disease. 5. Small calcified granulomas in the right lower lobe and right lobe of the liver again noted. 6. Atherosclerosis, including left main and 3 vessel coronary artery disease. Status post median sternotomy for CABG, including LIMA to the LAD. Electronically Signed   By: Vinnie Langton M.D.   On: 07/28/2015 07:14   Dg Chest Port 1 View  08/04/2015  CLINICAL DATA:  Shortness of breath EXAM: PORTABLE CHEST 1 VIEW COMPARISON:  Chest radiograph from one day prior. FINDINGS: Right internal jugular central venous catheter terminates in the upper third of the superior vena cava. Sternotomy wires appear aligned and intact. CABG clips overlie the mediastinum. Stable cardiomediastinal silhouette with mild cardiomegaly. No pneumothorax. No pleural effusion. Calcified right basilar granuloma is stable. Mild residual pulmonary edema, nearly resolved. Mild bibasilar atelectasis. IMPRESSION: 1. Stable mild cardiomegaly with nearly resolved mild residual pulmonary edema. 2. Mild bibasilar atelectasis. Electronically Signed   By: Ilona Sorrel M.D.   On: 08/04/2015 11:53   Dg Chest Port 1 View  08/03/2015  CLINICAL DATA:  Productive cough EXAM: PORTABLE CHEST 1 VIEW COMPARISON:  07/30/2015 FINDINGS: Cardiomegaly with pulmonary vascular congestion. Patchy opacities in the right upper lobe,  left lower lobe, and right infrahilar region. Differential considerations include multifocal pneumonia versus asymmetric interstitial edema. No definite pleural effusions.  No pneumothorax. Postsurgical changes related to prior CABG. Right IJ venous catheter terminates in the mid SVC. Median sternotomy. IMPRESSION: Cardiomegaly with pulmonary vascular congestion. Multifocal patchy opacities, suspicious for multifocal pneumonia, less likely asymmetric interstitial edema. Electronically Signed   By: Julian Hy M.D.   On: 08/03/2015 11:13   Dg Chest Port 1 View  07/25/2015  CLINICAL DATA:  Preop respiratory exam for lumbar spine disc herniation. Coronary artery disease, COPD, and chronic kidney disease. EXAM: PORTABLE CHEST 1 VIEW COMPARISON:  03/29/2014 FINDINGS: Mild cardiomegaly stable. No evidence pulmonary infiltrate or edema. No evidence  pleural effusion. Tiny calcified granuloma in the right lung base remains stable. Prior CABG again noted. IMPRESSION: Stable mild cardiomegaly.  No active lung disease. Electronically Signed   By: Earle Gell M.D.   On: 07/25/2015 21:55   Mr Outside Films Spine  07/26/2015  This examination belongs to an outside facility and is stored here for comparison purposes only.  Contact the originating outside institution for any associated report or interpretation.   Assessment/Plan  Physical deconditioning Will have him work with physical therapy and occupational therapy team to help with gait training and muscle strengthening exercises.fall precautions. Skin care. Encourage to be out of bed.   Lumbar stenosis with radiculopathy S/p laminectomy and discectomy. Continue oxyIR 5 mg q4h prn pain and tizanidine 4 mg tid prn muscle spasm. Back precautions. Has follow up with neurosurgery. Will have patient work with PT/OT as tolerated to regain strength and restore function.  Fall precautions are in place.  Protein calorie malnutrition Monitor po intake. Continue  glucerna  Leukocytosis Was treated for pneumonia. Currently on prednisone. This could be contributing some. Afebrile. Check cbc  Blood loss anemia Post op, monitor cbc  HCAP Add incentive spirometer. Has completed his augmentin course.  Dysphagia To work with SLP team, aspiration precautions  DM with ckd Monitor cbg. Continue novolog mix 70/30 40 u bid. Continue victoza. D/c SSI per pt request Lab Results  Component Value Date   HGBA1C 7.4* 08/04/2015    HTN Stable, monitor BP. Continue amlodipine and lopressor. Monitor bmp  COPD Add duoneb q6h prn. Continue symbicort and prednisone  Constipation Had bowel movement yesterday. D/c lactulose. Continue senokot s and miralax for now  PMR Stable, continue chronic prednisone 7 mg daily  ckd stage 4 Monitor bmp, continue calcitriol  Gout continue allopurinol 300 mg daily  Neuropathy Continue neurontin and change to 300 mg bid form tid for now  Hypokalemia continue KCL 20 meq daily  BPH continue Flomax and Proscar   OSA Continue CPAP   Goals of care: short term rehabilitation   Labs/tests ordered: cbc, cmp  Family/ staff Communication: reviewed care plan with patient and nursing supervisor    Blanchie Serve, MD Internal Medicine [place], [place] [address] Cell Phone (Monday-Friday 8 am - 5 pm): [phone] On Call: [phone] and follow prompts after 5 pm and on weekends Office Phone: [phone] Office Fax: [phone]

## 2015-08-15 DIAGNOSIS — R2681 Unsteadiness on feet: Secondary | ICD-10-CM | POA: Diagnosis not present

## 2015-08-15 DIAGNOSIS — J189 Pneumonia, unspecified organism: Secondary | ICD-10-CM | POA: Diagnosis not present

## 2015-08-15 DIAGNOSIS — M6281 Muscle weakness (generalized): Secondary | ICD-10-CM | POA: Diagnosis not present

## 2015-08-15 DIAGNOSIS — R488 Other symbolic dysfunctions: Secondary | ICD-10-CM | POA: Diagnosis not present

## 2015-08-15 DIAGNOSIS — M961 Postlaminectomy syndrome, not elsewhere classified: Secondary | ICD-10-CM | POA: Diagnosis not present

## 2015-08-15 DIAGNOSIS — R262 Difficulty in walking, not elsewhere classified: Secondary | ICD-10-CM | POA: Diagnosis not present

## 2015-08-15 DIAGNOSIS — R1312 Dysphagia, oropharyngeal phase: Secondary | ICD-10-CM | POA: Diagnosis not present

## 2015-08-15 DIAGNOSIS — M4806 Spinal stenosis, lumbar region: Secondary | ICD-10-CM | POA: Diagnosis not present

## 2015-08-15 DIAGNOSIS — Z4789 Encounter for other orthopedic aftercare: Secondary | ICD-10-CM | POA: Diagnosis not present

## 2015-08-20 LAB — BASIC METABOLIC PANEL
BUN: 19 mg/dL (ref 4–21)
Creatinine: 1.6 mg/dL — AB (ref 0.6–1.3)
Glucose: 59 mg/dL
Potassium: 4.2 mmol/L (ref 3.4–5.3)
Sodium: 143 mmol/L (ref 137–147)

## 2015-08-20 LAB — CBC AND DIFFERENTIAL
HCT: 39 % — AB (ref 41–53)
Hemoglobin: 12.1 g/dL — AB (ref 13.5–17.5)
Neutrophils Absolute: 5 /uL
Platelets: 322 10*3/uL (ref 150–399)
WBC: 7.8 10^3/mL

## 2015-08-21 ENCOUNTER — Encounter: Payer: Self-pay | Admitting: Adult Health

## 2015-08-21 NOTE — Progress Notes (Signed)
Patient ID: Jacob [name], male   DOB: 23-Oct-1940, 75 y.o.   MRN: [name]   This encounter was created in error - please disregard.

## 2015-08-23 ENCOUNTER — Telehealth: Payer: Self-pay | Admitting: *Deleted

## 2015-08-23 DIAGNOSIS — I129 Hypertensive chronic kidney disease with stage 1 through stage 4 chronic kidney disease, or unspecified chronic kidney disease: Secondary | ICD-10-CM | POA: Diagnosis not present

## 2015-08-23 DIAGNOSIS — M961 Postlaminectomy syndrome, not elsewhere classified: Secondary | ICD-10-CM | POA: Diagnosis not present

## 2015-08-23 DIAGNOSIS — N189 Chronic kidney disease, unspecified: Secondary | ICD-10-CM | POA: Diagnosis not present

## 2015-08-23 DIAGNOSIS — I2510 Atherosclerotic heart disease of native coronary artery without angina pectoris: Secondary | ICD-10-CM | POA: Diagnosis not present

## 2015-08-23 DIAGNOSIS — E43 Unspecified severe protein-calorie malnutrition: Secondary | ICD-10-CM | POA: Diagnosis not present

## 2015-08-23 DIAGNOSIS — J449 Chronic obstructive pulmonary disease, unspecified: Secondary | ICD-10-CM | POA: Diagnosis not present

## 2015-08-23 DIAGNOSIS — Z8701 Personal history of pneumonia (recurrent): Secondary | ICD-10-CM | POA: Diagnosis not present

## 2015-08-23 DIAGNOSIS — M5126 Other intervertebral disc displacement, lumbar region: Secondary | ICD-10-CM | POA: Diagnosis not present

## 2015-08-23 DIAGNOSIS — E1122 Type 2 diabetes mellitus with diabetic chronic kidney disease: Secondary | ICD-10-CM | POA: Diagnosis not present

## 2015-08-23 DIAGNOSIS — M353 Polymyalgia rheumatica: Secondary | ICD-10-CM | POA: Diagnosis not present

## 2015-08-23 DIAGNOSIS — M4806 Spinal stenosis, lumbar region: Secondary | ICD-10-CM | POA: Diagnosis not present

## 2015-08-23 DIAGNOSIS — G4733 Obstructive sleep apnea (adult) (pediatric): Secondary | ICD-10-CM | POA: Diagnosis not present

## 2015-08-23 DIAGNOSIS — Z951 Presence of aortocoronary bypass graft: Secondary | ICD-10-CM | POA: Diagnosis not present

## 2015-08-23 DIAGNOSIS — R1312 Dysphagia, oropharyngeal phase: Secondary | ICD-10-CM | POA: Diagnosis not present

## 2015-08-23 DIAGNOSIS — M4805 Spinal stenosis, thoracolumbar region: Secondary | ICD-10-CM | POA: Diagnosis not present

## 2015-08-23 DIAGNOSIS — Z483 Aftercare following surgery for neoplasm: Secondary | ICD-10-CM | POA: Diagnosis not present

## 2015-08-23 NOTE — Telephone Encounter (Signed)
[facility] called about patients blood sugar.  He stated that patient called and said that blood sugar was 40 and later on it was 200.  He has not been here in over a year.  Would you like for him to come in for and OV?

## 2015-08-26 NOTE — Telephone Encounter (Signed)
SPoke with wife, he does have a PCP at the [facility].

## 2015-08-26 NOTE — Telephone Encounter (Signed)
Call patient for update.  He also receives care from the [facility] system.  Has he established with another primary care office other than [name] who is outside of the [facility] system or is he still receiving most care from the [facility]?

## 2015-08-28 ENCOUNTER — Encounter (HOSPITAL_COMMUNITY): Payer: Self-pay | Admitting: Internal Medicine

## 2015-08-28 ENCOUNTER — Inpatient Hospital Stay (HOSPITAL_COMMUNITY)
Admission: AD | Admit: 2015-08-28 | Discharge: 2015-09-09 | DRG: 856 | Disposition: A | Discharge status: Skilled Nursing Facility | Payer: PPO | Source: Other Acute Inpatient Hospital | Attending: Internal Medicine | Admitting: Internal Medicine

## 2015-08-28 ENCOUNTER — Telehealth: Payer: Self-pay

## 2015-08-28 ENCOUNTER — Telehealth: Payer: Self-pay | Admitting: Internal Medicine

## 2015-08-28 DIAGNOSIS — I129 Hypertensive chronic kidney disease with stage 1 through stage 4 chronic kidney disease, or unspecified chronic kidney disease: Secondary | ICD-10-CM | POA: Diagnosis not present

## 2015-08-28 DIAGNOSIS — M4626 Osteomyelitis of vertebra, lumbar region: Secondary | ICD-10-CM | POA: Diagnosis present

## 2015-08-28 DIAGNOSIS — E1142 Type 2 diabetes mellitus with diabetic polyneuropathy: Secondary | ICD-10-CM

## 2015-08-28 DIAGNOSIS — Z7982 Long term (current) use of aspirin: Secondary | ICD-10-CM

## 2015-08-28 DIAGNOSIS — M5126 Other intervertebral disc displacement, lumbar region: Secondary | ICD-10-CM | POA: Diagnosis present

## 2015-08-28 DIAGNOSIS — I25810 Atherosclerosis of coronary artery bypass graft(s) without angina pectoris: Secondary | ICD-10-CM | POA: Diagnosis not present

## 2015-08-28 DIAGNOSIS — E876 Hypokalemia: Secondary | ICD-10-CM | POA: Diagnosis present

## 2015-08-28 DIAGNOSIS — Z8614 Personal history of Methicillin resistant Staphylococcus aureus infection: Secondary | ICD-10-CM

## 2015-08-28 DIAGNOSIS — Z6829 Body mass index (BMI) 29.0-29.9, adult: Secondary | ICD-10-CM

## 2015-08-28 DIAGNOSIS — Z888 Allergy status to other drugs, medicaments and biological substances status: Secondary | ICD-10-CM

## 2015-08-28 DIAGNOSIS — R4587 Impulsiveness: Secondary | ICD-10-CM | POA: Diagnosis present

## 2015-08-28 DIAGNOSIS — E1122 Type 2 diabetes mellitus with diabetic chronic kidney disease: Secondary | ICD-10-CM | POA: Diagnosis present

## 2015-08-28 DIAGNOSIS — M4806 Spinal stenosis, lumbar region: Secondary | ICD-10-CM | POA: Diagnosis present

## 2015-08-28 DIAGNOSIS — J449 Chronic obstructive pulmonary disease, unspecified: Secondary | ICD-10-CM

## 2015-08-28 DIAGNOSIS — Z8701 Personal history of pneumonia (recurrent): Secondary | ICD-10-CM | POA: Diagnosis not present

## 2015-08-28 DIAGNOSIS — G4733 Obstructive sleep apnea (adult) (pediatric): Secondary | ICD-10-CM | POA: Diagnosis present

## 2015-08-28 DIAGNOSIS — I130 Hypertensive heart and chronic kidney disease with heart failure and stage 1 through stage 4 chronic kidney disease, or unspecified chronic kidney disease: Secondary | ICD-10-CM | POA: Diagnosis present

## 2015-08-28 DIAGNOSIS — N184 Chronic kidney disease, stage 4 (severe): Secondary | ICD-10-CM | POA: Diagnosis present

## 2015-08-28 DIAGNOSIS — M545 Low back pain, unspecified: Secondary | ICD-10-CM

## 2015-08-28 DIAGNOSIS — Y838 Other surgical procedures as the cause of abnormal reaction of the patient, or of later complication, without mention of misadventure at the time of the procedure: Secondary | ICD-10-CM | POA: Diagnosis present

## 2015-08-28 DIAGNOSIS — E119 Type 2 diabetes mellitus without complications: Secondary | ICD-10-CM | POA: Diagnosis not present

## 2015-08-28 DIAGNOSIS — Z981 Arthrodesis status: Secondary | ICD-10-CM | POA: Diagnosis not present

## 2015-08-28 DIAGNOSIS — E860 Dehydration: Secondary | ICD-10-CM | POA: Diagnosis present

## 2015-08-28 DIAGNOSIS — M069 Rheumatoid arthritis, unspecified: Secondary | ICD-10-CM | POA: Diagnosis present

## 2015-08-28 DIAGNOSIS — R4182 Altered mental status, unspecified: Secondary | ICD-10-CM | POA: Diagnosis not present

## 2015-08-28 DIAGNOSIS — T814XXA Infection following a procedure, initial encounter: Principal | ICD-10-CM | POA: Diagnosis present

## 2015-08-28 DIAGNOSIS — E43 Unspecified severe protein-calorie malnutrition: Secondary | ICD-10-CM | POA: Diagnosis not present

## 2015-08-28 DIAGNOSIS — Z7951 Long term (current) use of inhaled steroids: Secondary | ICD-10-CM | POA: Diagnosis not present

## 2015-08-28 DIAGNOSIS — M5416 Radiculopathy, lumbar region: Secondary | ICD-10-CM

## 2015-08-28 DIAGNOSIS — Z01811 Encounter for preprocedural respiratory examination: Secondary | ICD-10-CM

## 2015-08-28 DIAGNOSIS — N189 Chronic kidney disease, unspecified: Secondary | ICD-10-CM | POA: Diagnosis not present

## 2015-08-28 DIAGNOSIS — Z794 Long term (current) use of insulin: Secondary | ICD-10-CM

## 2015-08-28 DIAGNOSIS — M4646 Discitis, unspecified, lumbar region: Secondary | ICD-10-CM | POA: Diagnosis present

## 2015-08-28 DIAGNOSIS — Z419 Encounter for procedure for purposes other than remedying health state, unspecified: Secondary | ICD-10-CM

## 2015-08-28 DIAGNOSIS — E1169 Type 2 diabetes mellitus with other specified complication: Secondary | ICD-10-CM

## 2015-08-28 DIAGNOSIS — F4323 Adjustment disorder with mixed anxiety and depressed mood: Secondary | ICD-10-CM | POA: Diagnosis present

## 2015-08-28 DIAGNOSIS — M353 Polymyalgia rheumatica: Secondary | ICD-10-CM

## 2015-08-28 DIAGNOSIS — C649 Malignant neoplasm of unspecified kidney, except renal pelvis: Secondary | ICD-10-CM | POA: Diagnosis present

## 2015-08-28 DIAGNOSIS — Z951 Presence of aortocoronary bypass graft: Secondary | ICD-10-CM | POA: Diagnosis not present

## 2015-08-28 DIAGNOSIS — N401 Enlarged prostate with lower urinary tract symptoms: Secondary | ICD-10-CM | POA: Diagnosis present

## 2015-08-28 DIAGNOSIS — Z9889 Other specified postprocedural states: Secondary | ICD-10-CM | POA: Diagnosis not present

## 2015-08-28 DIAGNOSIS — F4329 Adjustment disorder with other symptoms: Secondary | ICD-10-CM | POA: Diagnosis present

## 2015-08-28 DIAGNOSIS — E1140 Type 2 diabetes mellitus with diabetic neuropathy, unspecified: Secondary | ICD-10-CM | POA: Diagnosis present

## 2015-08-28 DIAGNOSIS — Z66 Do not resuscitate: Secondary | ICD-10-CM | POA: Diagnosis present

## 2015-08-28 DIAGNOSIS — Z7952 Long term (current) use of systemic steroids: Secondary | ICD-10-CM

## 2015-08-28 DIAGNOSIS — Z8249 Family history of ischemic heart disease and other diseases of the circulatory system: Secondary | ICD-10-CM | POA: Diagnosis not present

## 2015-08-28 DIAGNOSIS — N2889 Other specified disorders of kidney and ureter: Secondary | ICD-10-CM

## 2015-08-28 DIAGNOSIS — I5030 Unspecified diastolic (congestive) heart failure: Secondary | ICD-10-CM | POA: Diagnosis not present

## 2015-08-28 DIAGNOSIS — Z418 Encounter for other procedures for purposes other than remedying health state: Secondary | ICD-10-CM

## 2015-08-28 DIAGNOSIS — R778 Other specified abnormalities of plasma proteins: Secondary | ICD-10-CM | POA: Diagnosis present

## 2015-08-28 DIAGNOSIS — G629 Polyneuropathy, unspecified: Secondary | ICD-10-CM

## 2015-08-28 DIAGNOSIS — M459 Ankylosing spondylitis of unspecified sites in spine: Secondary | ICD-10-CM

## 2015-08-28 DIAGNOSIS — R338 Other retention of urine: Secondary | ICD-10-CM | POA: Diagnosis present

## 2015-08-28 DIAGNOSIS — R7989 Other specified abnormal findings of blood chemistry: Secondary | ICD-10-CM | POA: Diagnosis present

## 2015-08-28 DIAGNOSIS — I5032 Chronic diastolic (congestive) heart failure: Secondary | ICD-10-CM | POA: Diagnosis present

## 2015-08-28 DIAGNOSIS — Z483 Aftercare following surgery for neoplasm: Secondary | ICD-10-CM | POA: Diagnosis not present

## 2015-08-28 DIAGNOSIS — I2510 Atherosclerotic heart disease of native coronary artery without angina pectoris: Secondary | ICD-10-CM | POA: Diagnosis present

## 2015-08-28 DIAGNOSIS — Z8709 Personal history of other diseases of the respiratory system: Secondary | ICD-10-CM

## 2015-08-28 DIAGNOSIS — Z87891 Personal history of nicotine dependence: Secondary | ICD-10-CM

## 2015-08-28 DIAGNOSIS — M79605 Pain in left leg: Secondary | ICD-10-CM | POA: Diagnosis not present

## 2015-08-28 DIAGNOSIS — J189 Pneumonia, unspecified organism: Secondary | ICD-10-CM

## 2015-08-28 DIAGNOSIS — E118 Type 2 diabetes mellitus with unspecified complications: Secondary | ICD-10-CM | POA: Diagnosis not present

## 2015-08-28 DIAGNOSIS — G9340 Encephalopathy, unspecified: Secondary | ICD-10-CM | POA: Diagnosis present

## 2015-08-28 DIAGNOSIS — M4647 Discitis, unspecified, lumbosacral region: Secondary | ICD-10-CM | POA: Diagnosis not present

## 2015-08-28 DIAGNOSIS — N289 Disorder of kidney and ureter, unspecified: Secondary | ICD-10-CM

## 2015-08-28 DIAGNOSIS — M48061 Spinal stenosis, lumbar region without neurogenic claudication: Secondary | ICD-10-CM | POA: Diagnosis present

## 2015-08-28 DIAGNOSIS — E1165 Type 2 diabetes mellitus with hyperglycemia: Secondary | ICD-10-CM | POA: Diagnosis present

## 2015-08-28 DIAGNOSIS — Z79899 Other long term (current) drug therapy: Secondary | ICD-10-CM

## 2015-08-28 DIAGNOSIS — R1312 Dysphagia, oropharyngeal phase: Secondary | ICD-10-CM | POA: Diagnosis not present

## 2015-08-28 DIAGNOSIS — M961 Postlaminectomy syndrome, not elsewhere classified: Secondary | ICD-10-CM | POA: Diagnosis not present

## 2015-08-28 DIAGNOSIS — R630 Anorexia: Secondary | ICD-10-CM | POA: Diagnosis present

## 2015-08-28 DIAGNOSIS — R945 Abnormal results of liver function studies: Secondary | ICD-10-CM

## 2015-08-28 DIAGNOSIS — F4320 Adjustment disorder, unspecified: Secondary | ICD-10-CM | POA: Diagnosis not present

## 2015-08-28 DIAGNOSIS — M79604 Pain in right leg: Secondary | ICD-10-CM | POA: Diagnosis not present

## 2015-08-28 DIAGNOSIS — M4640 Discitis, unspecified, site unspecified: Secondary | ICD-10-CM | POA: Insufficient documentation

## 2015-08-28 LAB — POCT I-STAT 3, ART BLOOD GAS (G3+)
Acid-base deficit: 2 mmol/L (ref 0.0–2.0)
Bicarbonate: 23.5 mEq/L (ref 20.0–24.0)
O2 Saturation: 97 %
Patient temperature: 97.3
TCO2: 25 mmol/L (ref 0–100)
pCO2 arterial: 39.2 mmHg (ref 35.0–45.0)
pH, Arterial: 7.382 (ref 7.350–7.450)
pO2, Arterial: 93 mmHg (ref 80.0–100.0)

## 2015-08-28 LAB — GLUCOSE, CAPILLARY: Glucose-Capillary: 233 mg/dL — ABNORMAL HIGH (ref 65–99)

## 2015-08-28 MED ORDER — CETYLPYRIDINIUM CHLORIDE 0.05 % MT LIQD
7.0000 mL | Freq: Two times a day (BID) | OROMUCOSAL | Status: DC
  Administered 2015-08-29 – 2015-09-09 (×19): 7 mL via OROMUCOSAL

## 2015-08-28 MED ORDER — INSULIN ASPART 100 UNIT/ML ~~LOC~~ SOLN
0.0000 [IU] | SUBCUTANEOUS | Status: DC
  Administered 2015-08-28 – 2015-08-29 (×2): 3 [IU] via SUBCUTANEOUS

## 2015-08-28 NOTE — H&P (Addendum)
Jacob [name] [id number] DOB: 12-02-1940 DOA: 08/28/2015     PCP: [name], MD  also has received care at [facility] Specialists: Neurosurgery [place].  Patient coming from:   home Lives  With family    Chief Complaint:  Transferred for acute encephalopathy  HPI: Jacob [name] is a 75 y.o. male with medical history significant of lumbar stenosis, COPD, rheumatoid arthritis, hypertension, sleep apnea, CAD, DM 2, neuropathy, PMR,      Presented with worsening encephalopathy for the past 24 hours has been doing worse overall. He was discharged to home on June 8th. On 10th of June he started to get progressively worse. He was seen in office by Dr. Christella Noa and was doing better. But started to threaten family he had psych eval at [facility] and was told to go to PCP. Today they went to ER at [facility] care. He was seen in [place] ED. patient had extensive workup chest x-ray was unremarkable UA was concentrated but no evidence of UTI. CT scan of the head was non-acute. U tox positive for benzos and opioids. Troponin was slightly elevated but no EKG changes suggestive of ischemia. They were planing to do MRI to see what is going on he was given something for anxiety and have been lethargic since.   where he have gone imaging of his back which was inconclusive for infection versus surgical changes but one does not appear to be infected. He was noted to have elevated LFTs ultrasound was done showing no evidence of cholecystitis. Creatinine was found to be 1.3 which is above his baseline. Chest x-ray while in the facility was unremarkable. Patient was accepted in transfer to stepdown to [facility] Family reports that he has not been eating and drinking for past 3-4 days. He has not taken much of his home medications he did have a dose of Percocet this morning and Flexeril.   Regarding pertinent Chronic problems: In May patient had a fall in the bathtub MRI of the back show large herniated nucleus  pulposus at L3-4 with severe lumbar stenosis. He has undergone lumbar laminectomy discectomy at the level L3/4 by Dr. Christella Noa In May this was complicated by pneumonia acute renal failure she was discharged to nursing home [facility] place and 26th of May Blood pressure at baseline controlled with amlodipine and Lopressor While in the nursing home he finished Augmentin for his age Pneumonia She has history of polymyalgia rheumatica for which she takes prednisone 7 mg daily Last echogram done in May 2017 showing preserved EF no evidence of diastolic dysfunction  Hospitalist was called for admission for acute encephalopathy  Review of Systems:    Pertinent positives include: Confusion  Constitutional:  No weight loss, night sweats, Fevers, chills, fatigue, weight loss  HEENT:  No headaches, Difficulty swallowing,Tooth/dental problems,Sore throat,  No sneezing, itching, ear ache, nasal congestion, post nasal drip,  Cardio-vascular:  No chest pain, Orthopnea, PND, anasarca, dizziness, palpitations.no Bilateral lower extremity swelling  GI:  No heartburn, indigestion, abdominal pain, nausea, vomiting, diarrhea, change in bowel habits, loss of appetite, melena, blood in stool, hematemesis Resp:  no shortness of breath at rest. No dyspnea on exertion, No excess mucus, no productive cough, No non-productive cough, No coughing up of blood.No change in color of mucus.No wheezing. Skin:  no rash or lesions. No jaundice GU:  no dysuria, change in color of urine, no urgency or frequency. No straining to urinate.  No flank pain.  Musculoskeletal:  No joint pain or  no joint swelling. No decreased range of motion. No back pain.  Psych:  No change in mood or affect. No depression or anxiety. No memory loss.  Neuro: no localizing neurological complaints, no tingling, no weakness, no double vision, no gait abnormality, no slurred speech   As per HPI otherwise 10 point review of systems negative.   Past  Medical History: Past Medical History  Diagnosis Date  . Allergy   . Asthma   . Diabetes mellitus without complication ([place])   . Cataract   . Arthritis   . Polymyalgia rheumatica (HCC)     maintained on Prednisone, Plaquenil. Followed by rhuematology every 4 months/[name].  Marland Kitchen COPD (chronic obstructive pulmonary disease) ([place])   . Anemia   . Hypertension   . BPH (benign prostatic hyperplasia)   . Sleep apnea     CPAP   Trying to use  . Shortness of breath dyspnea     with exertion  . Anxiety   . Chronic kidney disease     chronic  kidney failure  kidney function at 42%  . Coronary artery disease     CABG  7 bypasses  . Hepatitis     many years ago  . Cancer of kidney [facility])    Past Surgical History  Procedure Laterality Date  . Appendectomy    . Hernia repair    . Prostate surgery      TURP at [facility].  Marland Kitchen Coronary artery bypass graft  03/17/1995    Wynonia Lawman; followed every six months.  . Cataract extraction w/phaco Right 11/28/2014    Procedure: CATARACT EXTRACTION PHACO AND INTRAOCULAR LENS PLACEMENT (IOC) RIGHT ;  Surgeon: Marylynn Pearson, MD;  Location: [place];  Service: Ophthalmology;  Laterality: Right;  . Lumbar laminectomy/decompression microdiscectomy N/A 07/30/2015    Procedure: LUMBAR LAMINECTOMY DISCECTOMY ;  Surgeon: Ashok Pall, MD;  Location: [place] NEURO ORS;  Service: Neurosurgery;  Laterality: N/A;  LUMBAR LAMINECTOMY DISCECTOMY      Social History:  Ambulatory  Walker or cane     reports that he quit smoking about 58 years ago. He has never used smokeless tobacco. He reports that he does not drink alcohol or use illicit drugs.  Allergies:   Allergies  Allergen Reactions  . Ace Inhibitors Other (See Comments)    Probably nausea and vomiting per patient   . Actonel [Risedronate] Nausea And Vomiting  . Ciprocinonide [Fluocinolone] Other (See Comments)    Probably nausea and vomiting per patient  . Flunisolide Other (See Comments)    Probably nausea and vomiting  per patient   . Metformin And Related Other (See Comments)    Probably nausea and vomiting per patient   . Sertraline Other (See Comments)    Probably nausea and vomiting per patient   . Sulindac Other (See Comments)    Probably nausea and vomiting per patient   . Terazosin Other (See Comments)    Probably nausea and vomiting per patient        Family History:    Family History  Problem Relation Age of Onset  . Hypertension Other     Medications: Prior to Admission medications   Medication Sig Start Date End Date Taking? Authorizing Provider  acetaminophen (TYLENOL) 325 MG tablet Take 2 tablets (650 mg total) by mouth every 6 (six) hours as needed for mild pain (or Fever >/= 101). 08/09/15   Silver Huguenin Elgergawy, MD  acidophilus (RISAQUAD) CAPS capsule Take 2 capsules by mouth daily. 08/09/15  Albertine Patricia, MD  albuterol (PROVENTIL HFA;VENTOLIN HFA) 108 (90 BASE) MCG/ACT inhaler Inhale 2 puffs into the lungs every 6 (six) hours as needed for wheezing or shortness of breath. 07/16/13   Wardell Honour, MD  allopurinol (ZYLOPRIM) 300 MG tablet Take 300 mg by mouth daily with lunch.    Historical Provider, MD  amLODipine (NORVASC) 10 MG tablet Take 10 mg by mouth daily with supper.     Historical Provider, MD  aspirin EC 81 MG tablet Take 81 mg by mouth daily with lunch.    Historical Provider, MD  Brinzolamide-Brimonidine [facility]) 1-0.2 % SUSP Place 1 drop into both eyes 2 (two) times daily.     Historical Provider, MD  budesonide-formoterol (SYMBICORT) 160-4.5 MCG/ACT inhaler Inhale 2 puffs into the lungs 2 (two) times daily.    Historical Provider, MD  calcitRIOL (ROCALTROL) 0.25 MCG capsule Take 0.25 mcg by mouth every Monday, Wednesday, and Friday.    Historical Provider, MD  cyclobenzaprine (FLEXERIL) 10 MG tablet Take 1 tablet (10 mg total) by mouth 3 (three) times daily as needed for muscle spasms. 08/09/15   Ashok Pall, MD  cycloSPORINE (RESTASIS) 0.05 % ophthalmic  emulsion Place 1 drop into both eyes 2 (two) times daily.    Historical Provider, MD  feeding supplement, GLUCERNA SHAKE, (GLUCERNA SHAKE) LIQD Take 237 mLs by mouth 3 (three) times daily between meals. 08/09/15   Silver Huguenin Elgergawy, MD  finasteride (PROSCAR) 5 MG tablet Take 5 mg by mouth at bedtime.     Historical Provider, MD  gabapentin (NEURONTIN) 300 MG capsule Take 1 capsule (300 mg total) by mouth 3 (three) times daily. PATIENT NEEDS OFFICE VISIT FOR ADDITIONAL REFILLS 01/23/13   Wendie Agreste, MD  insulin aspart protamine- aspart (NOVOLOG MIX 70/30) (70-30) 100 UNIT/ML injection Inject 40 Units into the skin 2 (two) times daily with a meal. Take a.m. & with supper    Historical Provider, MD  ipratropium-albuterol (DUONEB) 0.5-2.5 (3) MG/3ML SOLN Take 3 mLs by nebulization every 8 (eight) hours. Reported on 03/27/2015    Historical Provider, MD  Liraglutide (VICTOZA) 18 MG/3ML SOPN Inject 1.2 mg into the skin daily at 3 pm.     Historical Provider, MD  metoprolol (LOPRESSOR) 50 MG tablet Take 1 tablet (50 mg total) by mouth 2 (two) times daily. 08/09/15   Silver Huguenin Elgergawy, MD  montelukast (SINGULAIR) 10 MG tablet Take 1 tablet (10 mg total) by mouth at bedtime. 07/27/13   Wardell Honour, MD  Multiple Vitamins-Minerals (PRESERVISION AREDS 2 PO) Take 1 tablet by mouth 2 (two) times daily.     Historical Provider, MD  oxyCODONE (OXY IR/ROXICODONE) 5 MG immediate release tablet Take 1 tablet (5 mg total) by mouth every 4 (four) hours as needed for severe pain. 08/09/15   Silver Huguenin Elgergawy, MD  pilocarpine (PILOCAR) 2 % ophthalmic solution Place 1 drop into both eyes 3 (three) times daily.    Historical Provider, MD  polyethylene glycol (MIRALAX / GLYCOLAX) packet Take 17 g by mouth 2 (two) times daily.    Historical Provider, MD  potassium chloride SA (K-DUR,KLOR-CON) 20 MEQ tablet Take 20 mEq by mouth daily.    Historical Provider, MD  predniSONE (DELTASONE) 1 MG tablet Take 2 mg by mouth daily  with breakfast. IN CONJUNCTION WITH ONE 5 MG TABLET TO EQUAL A TOTAL OF 7 MILLIGRAMS    Historical Provider, MD  predniSONE (DELTASONE) 5 MG tablet Take 5 mg by mouth daily with breakfast.  IN CONJUNCTION WITH TWO 1 MG TABLETS TO EQUAL A TOTAL OF 7 MILLIGRAMS    Historical Provider, MD  sennosides-docusate sodium (SENOKOT-S) 8.6-50 MG tablet Take 2 tablets by mouth 2 (two) times daily.    Historical Provider, MD  simvastatin (ZOCOR) 40 MG tablet Take 40 mg by mouth daily.    Historical Provider, MD  sodium chloride 0.9 % nebulizer solution Take 3 mLs by nebulization every 6 (six) hours as needed for wheezing.    Historical Provider, MD  Tamsulosin HCl (FLOMAX) 0.4 MG CAPS Take 0.4 mg by mouth at bedtime.     Historical Provider, MD  tiZANidine (ZANAFLEX) 4 MG capsule Take 4 mg by mouth 3 (three) times daily as needed for muscle spasms.    Historical Provider, MD    Physical Exam: Patient Vitals for the past 24 hrs:  BP Temp Temp src Pulse Resp SpO2 Height Weight  08/28/15 2325 (!) 160/86 mmHg 97.5 F (36.4 C) Oral (!) 113 (!) 22 100 % - -  08/28/15 2300 (!) 160/86 mmHg - - (!) 114 20 96 % - -  08/28/15 2245 (!) 167/84 mmHg - - (!) 112 19 96 % - -  08/28/15 2230 (!) 170/136 mmHg - - (!) 112 18 98 % - -  08/28/15 2220 (!) 172/92 mmHg 97.3 F (36.3 C) Oral (!) 113 (!) 26 98 % 5\' 11"  (1.803 m) 95.9 kg (211 lb 6.7 oz)  08/28/15 2215 (!) 170/89 mmHg - - (!) 119 (!) 22 96 % - -    1. General:  in No Acute distress 2. Psychological: Somnolent and  not Oriented 3. Head/ENT:   Moist   Mucous Membranes                          Head Non traumatic, neck supple                           Poor Dentition 4. SKIN:  decreased Skin turgor,  Skin clean Dry and intact no rash 5. Heart: Regular rate and rhythm no Murmur, Rub or gallop 6. Lungs:  Clear to auscultation bilaterally, no wheezes or crackles   7. Abdomen: Soft, non-tender, Non distended 8. Lower extremities: no clubbing, cyanosis, or edema 9.  Neurologically Grossly intact, moving all 4 extremities equally 10. MSK: Normal range of motion   body mass index is 29.5 kg/(m^2).  Labs on Admission:   Labs on Admission: I have personally reviewed following labs and imaging studies  CBC: No results for input(s): WBC, NEUTROABS, HGB, HCT, MCV, PLT in the last 168 hours. Basic Metabolic Panel: No results for input(s): NA, K, CL, CO2, GLUCOSE, BUN, CREATININE, CALCIUM, MG, PHOS in the last 168 hours. GFR: Estimated Creatinine Clearance: 47.1 mL/min (by C-G formula based on Cr of 1.6). Liver Function Tests: No results for input(s): AST, ALT, ALKPHOS, BILITOT, PROT, ALBUMIN in the last 168 hours. No results for input(s): LIPASE, AMYLASE in the last 168 hours. No results for input(s): AMMONIA in the last 168 hours. Coagulation Profile: No results for input(s): INR, PROTIME in the last 168 hours. Cardiac Enzymes: No results for input(s): CKTOTAL, CKMB, CKMBINDEX, TROPONINI in the last 168 hours. BNP (last 3 results) No results for input(s): PROBNP in the last 8760 hours. HbA1C: No results for input(s): HGBA1C in the last 72 hours. CBG:  Recent Labs Lab 08/28/15 2241  GLUCAP 233*   Lipid Profile: No  results for input(s): CHOL, HDL, LDLCALC, TRIG, CHOLHDL, LDLDIRECT in the last 72 hours. Thyroid Function Tests: No results for input(s): TSH, T4TOTAL, FREET4, T3FREE, THYROIDAB in the last 72 hours. Anemia Panel: No results for input(s): VITAMINB12, FOLATE, FERRITIN, TIBC, IRON, RETICCTPCT in the last 72 hours. Urine analysis:    Component Value Date/Time   BILIRUBINUR neg 09/09/2013 1233   PROTEINUR >=300 09/09/2013 1233   UROBILINOGEN 0.2 09/09/2013 1233   NITRITE neg 09/09/2013 1233   LEUKOCYTESUR Negative 09/09/2013 1233   Sepsis Labs: @LABRCNTIP (procalcitonin:4,lacticidven:4) )No results found for this or any previous visit (from the past 240 hour(s)).     UA ordered  Lab Results  Component Value Date   HGBA1C  7.4* 08/04/2015    Estimated Creatinine Clearance: 47.1 mL/min (by C-G formula based on Cr of 1.6).  BNP (last 3 results) No results for input(s): PROBNP in the last 8760 hours.   ECG REPORT  Independently reviewed Rate: 113  Rhythm: sinus tachycardia ST&T Change: No acute ischemic changes   QTC 474  Filed Weights   08/28/15 2220  Weight: 95.9 kg (211 lb 6.7 oz)     Cultures:    Component Value Date/Time   SDES BLOOD RIGHT ANTECUBITAL 08/03/2015 1212   SPECREQUEST BOTTLES DRAWN AEROBIC AND ANAEROBIC 10CC 08/03/2015 1212   CULT NO GROWTH 5 DAYS 08/03/2015 1212   REPTSTATUS 08/08/2015 FINAL 08/03/2015 1212     Radiological Exams on Admission: No results found.  Chart has been reviewed    Assessment/Plan   75 y.o. male with medical history significant of lumbar stenosis Status post laminectomy, COPD, rheumatoid arthritis, hypertension, sleep apnea, CAD, DM 2, neuropathy, PMR, Here with acute encephalopathy in a setting of dehydration   Present on Admission:  . Acute encephalopathy  - the setting of dehydration and polypharmacy. We'll hold sedating medications mental status change has been tolerated after patient was given likely benzodiazepine for  MRI administration.  Ammonia level unremarkable ABG showing no evidence of hypercarbia. Outside facility imaging showed no evidence of acute CVA. UA showed no evidence of UTI Will rehydrate and see if patient improves  . CAD (coronary artery disease) - continue home medications. Restart metoprolol if unable to tolerate by mouth we'll switch to IV.  Marland Kitchen CHF with left ventricular diastolic dysfunction, NYHA class 2 ([place]) last echogram was done in May 2017 show no evidence of CHF. Patient at that time appeared to be slightly volume depleted. He is currently appears to be clinically dehydrated. We'll administer IV fluids avoid fluid overload  . OSA (obstructive sleep apnea) patient does not use a C Pap machine at baseline  .  Rheumatoid arthritis ([place]) restart steroids while unable to tolerate by mouth we'll give IV to avoid adrenal insufficiency  . Spinal stenosis of lumbar region with radiculopathy status post laminectomy questionable changes on MRI. Otherwise no evidence of infection. Will notify neurosurgery patient is here the morning  . Renal cancer ([place]) status post ablation, stable   elevated troponin patient wasn't endorsing any chest pain most likely secondary to demand ischemia. We will restart home medications continue to cycle cardiac enzymes. Repeat echogram to evaluate defers any new findings including wall motion abnormality. If continues to rise would need cardiology consult Tachycardia likely secondary to dehydration and not taking home medications improved with labetalol Diabetes mellitus Will order sliding scale and Lantus at 5 units Elevated LFTs outside hospital. Obtain ultrasound that showed no evidence of gallstone disease or cholecystitis possible dehydration related Other plan as  per orders.  DVT prophylaxis:  SCD    Code Status:  FULL CODE  as per  family   Family Communication:   Family   at  Bedside  plan of care was discussed with  Daughter [name] [phone],    Disposition Plan:    Back to current facility when stable                            Consults called: ER provider discussed case with Dr. Ellene Route with Neurosurgery  Admission status:  inpatient     Level of care  SDU      [place] 08/29/2015, 1:35 AM   [facility]  Pager [phone]   after 2 AM please page floor coverage PA If 7AM-7PM, please contact the day team taking care of the patient  [email or web address]  Password [email or web address]

## 2015-08-28 NOTE — Telephone Encounter (Signed)
Arleta Creek with [place] called in to get verbal orders from Dr. Tamala Julian about continuing patient's occupational therapy she needs 1 visit this week and 2 visits next week. Please call her back at [phone]. Thank you

## 2015-08-28 NOTE — Telephone Encounter (Signed)
Called by Jule Ser for transfer: 75 yo M with encephalopathy, surgery on 16th of last month with laminectomy.  Declining since then but much worse over past 24 hours, got huge work work up:  Scan of back = radiology says could be infected or could be just post surgical changes.  No erythema of wound site, does have some fluctuance, I did ask them to give NS a call and heads up about patient.  LFTs elevated with alk phos of 500, slight elevations of AST/ALT (70 something), US gallbladder is negative.  Has CKD stage 3 that actually looks better than baseline today, creat is 1.3 (was 1.6 and 1.7 during surgery last month).  CXR negative.  EDP thinks that most likely all of this is just due to polypharmacy, he personally dosent think wound looks infected, is calling NS and patient transfer to [facility] SDU.

## 2015-08-29 ENCOUNTER — Inpatient Hospital Stay (HOSPITAL_COMMUNITY): Payer: PPO

## 2015-08-29 ENCOUNTER — Telehealth: Payer: Self-pay

## 2015-08-29 ENCOUNTER — Ambulatory Visit (HOSPITAL_COMMUNITY): Payer: PPO

## 2015-08-29 ENCOUNTER — Encounter (HOSPITAL_COMMUNITY): Payer: Self-pay | Admitting: Internal Medicine

## 2015-08-29 DIAGNOSIS — R945 Abnormal results of liver function studies: Secondary | ICD-10-CM

## 2015-08-29 DIAGNOSIS — M5126 Other intervertebral disc displacement, lumbar region: Secondary | ICD-10-CM | POA: Diagnosis not present

## 2015-08-29 DIAGNOSIS — R778 Other specified abnormalities of plasma proteins: Secondary | ICD-10-CM | POA: Diagnosis present

## 2015-08-29 DIAGNOSIS — I5030 Unspecified diastolic (congestive) heart failure: Secondary | ICD-10-CM

## 2015-08-29 DIAGNOSIS — R7989 Other specified abnormal findings of blood chemistry: Secondary | ICD-10-CM

## 2015-08-29 DIAGNOSIS — G9340 Encephalopathy, unspecified: Secondary | ICD-10-CM

## 2015-08-29 DIAGNOSIS — I2510 Atherosclerotic heart disease of native coronary artery without angina pectoris: Secondary | ICD-10-CM | POA: Diagnosis not present

## 2015-08-29 DIAGNOSIS — E118 Type 2 diabetes mellitus with unspecified complications: Secondary | ICD-10-CM | POA: Diagnosis not present

## 2015-08-29 DIAGNOSIS — M459 Ankylosing spondylitis of unspecified sites in spine: Secondary | ICD-10-CM

## 2015-08-29 DIAGNOSIS — R4182 Altered mental status, unspecified: Secondary | ICD-10-CM | POA: Diagnosis not present

## 2015-08-29 DIAGNOSIS — F4323 Adjustment disorder with mixed anxiety and depressed mood: Secondary | ICD-10-CM | POA: Diagnosis not present

## 2015-08-29 HISTORY — DX: Other specified abnormalities of plasma proteins: R77.8

## 2015-08-29 HISTORY — DX: Other specified abnormal findings of blood chemistry: R79.89

## 2015-08-29 HISTORY — DX: Encephalopathy, unspecified: G93.40

## 2015-08-29 LAB — ECHOCARDIOGRAM COMPLETE
E decel time: 155 msec
E/e' ratio: 7.91
FS: 24 % — AB (ref 28–44)
Height: 71 in
IVS/LV PW RATIO, ED: 1.03
LA ID, A-P, ES: 40 mm
LA diam end sys: 40 mm
LA diam index: 1.85 cm/m2
LA vol A4C: 54.4 ml
LV E/e' medial: 7.91
LV E/e'average: 7.91
LV PW d: 11.8 mm — AB (ref 0.6–1.1)
LV dias vol index: 36 mL/m2
LV dias vol: 77 mL (ref 62–150)
LV e' LATERAL: 10.8 cm/s
LV sys vol index: 22 mL/m2
LV sys vol: 47 mL (ref 21–61)
LVOT area: 4.52 cm2
LVOT diameter: 24 mm
MV Dec: 155
MV Peak grad: 3 mmHg
MV pk A vel: 119 m/s
MV pk E vel: 85.4 m/s
Simpson's disk: 39
Stroke v: 30 ml
TAPSE: 14.7 mm
TDI e' lateral: 10.8
TDI e' medial: 9.36
Weight: 3376 oz

## 2015-08-29 LAB — CBC WITH DIFFERENTIAL/PLATELET
Basophils Absolute: 0.1 10*3/uL (ref 0.0–0.1)
Basophils Relative: 1 %
Eosinophils Absolute: 0.5 10*3/uL (ref 0.0–0.7)
Eosinophils Relative: 8 %
HCT: 37.5 % — ABNORMAL LOW (ref 39.0–52.0)
Hemoglobin: 11.8 g/dL — ABNORMAL LOW (ref 13.0–17.0)
Lymphocytes Relative: 21 %
Lymphs Abs: 1.3 10*3/uL (ref 0.7–4.0)
MCH: 29.9 pg (ref 26.0–34.0)
MCHC: 31.5 g/dL (ref 30.0–36.0)
MCV: 95.2 fL (ref 78.0–100.0)
Monocytes Absolute: 0.7 10*3/uL (ref 0.1–1.0)
Monocytes Relative: 11 %
Neutro Abs: 3.8 10*3/uL (ref 1.7–7.7)
Neutrophils Relative %: 59 %
Platelets: 254 10*3/uL (ref 150–400)
RBC: 3.94 MIL/uL — ABNORMAL LOW (ref 4.22–5.81)
RDW: 15 % (ref 11.5–15.5)
WBC: 6.4 10*3/uL (ref 4.0–10.5)

## 2015-08-29 LAB — COMPREHENSIVE METABOLIC PANEL
ALT: 72 U/L — ABNORMAL HIGH (ref 17–63)
ALT: 61 U/L (ref 17–63)
AST: 39 U/L (ref 15–41)
AST: 31 U/L (ref 15–41)
Albumin: 2.8 g/dL — ABNORMAL LOW (ref 3.5–5.0)
Albumin: 2.6 g/dL — ABNORMAL LOW (ref 3.5–5.0)
Alkaline Phosphatase: 461 U/L — ABNORMAL HIGH (ref 38–126)
Alkaline Phosphatase: 407 U/L — ABNORMAL HIGH (ref 38–126)
Anion gap: 11 (ref 5–15)
Anion gap: 9 (ref 5–15)
BUN: 13 mg/dL (ref 6–20)
BUN: 12 mg/dL (ref 6–20)
CO2: 24 mmol/L (ref 22–32)
CO2: 25 mmol/L (ref 22–32)
Calcium: 9.2 mg/dL (ref 8.9–10.3)
Calcium: 9 mg/dL (ref 8.9–10.3)
Chloride: 103 mmol/L (ref 101–111)
Chloride: 106 mmol/L (ref 101–111)
Creatinine, Ser: 1.61 mg/dL — ABNORMAL HIGH (ref 0.61–1.24)
Creatinine, Ser: 1.44 mg/dL — ABNORMAL HIGH (ref 0.61–1.24)
GFR calc Af Amer: 47 mL/min — ABNORMAL LOW (ref >60)
GFR calc Af Amer: 53 mL/min — ABNORMAL LOW (ref >60)
GFR calc non Af Amer: 40 mL/min — ABNORMAL LOW (ref >60)
GFR calc non Af Amer: 46 mL/min — ABNORMAL LOW (ref >60)
Glucose, Bld: 237 mg/dL — ABNORMAL HIGH (ref 65–99)
Glucose, Bld: 184 mg/dL — ABNORMAL HIGH (ref 65–99)
Potassium: 3.5 mmol/L (ref 3.5–5.1)
Potassium: 3.5 mmol/L (ref 3.5–5.1)
Sodium: 138 mmol/L (ref 135–145)
Sodium: 140 mmol/L (ref 135–145)
Total Bilirubin: 1.4 mg/dL — ABNORMAL HIGH (ref 0.3–1.2)
Total Bilirubin: 1.2 mg/dL (ref 0.3–1.2)
Total Protein: 6.3 g/dL — ABNORMAL LOW (ref 6.5–8.1)
Total Protein: 5.9 g/dL — ABNORMAL LOW (ref 6.5–8.1)

## 2015-08-29 LAB — PREALBUMIN: Prealbumin: 11.3 mg/dL — ABNORMAL LOW (ref 18–38)

## 2015-08-29 LAB — GLUCOSE, CAPILLARY
Glucose-Capillary: 203 mg/dL — ABNORMAL HIGH (ref 65–99)
Glucose-Capillary: 223 mg/dL — ABNORMAL HIGH (ref 65–99)
Glucose-Capillary: 284 mg/dL — ABNORMAL HIGH (ref 65–99)
Glucose-Capillary: 375 mg/dL — ABNORMAL HIGH (ref 65–99)

## 2015-08-29 LAB — AMMONIA: Ammonia: 28 umol/L (ref 9–35)

## 2015-08-29 LAB — CBC
HCT: 36.2 % — ABNORMAL LOW (ref 39.0–52.0)
Hemoglobin: 11.3 g/dL — ABNORMAL LOW (ref 13.0–17.0)
MCH: 29.8 pg (ref 26.0–34.0)
MCHC: 31.2 g/dL (ref 30.0–36.0)
MCV: 95.5 fL (ref 78.0–100.0)
Platelets: 250 10*3/uL (ref 150–400)
RBC: 3.79 MIL/uL — ABNORMAL LOW (ref 4.22–5.81)
RDW: 15 % (ref 11.5–15.5)
WBC: 6.3 10*3/uL (ref 4.0–10.5)

## 2015-08-29 LAB — TROPONIN I
Troponin I: 0.05 ng/mL — ABNORMAL HIGH (ref <0.031)
Troponin I: 0.04 ng/mL — ABNORMAL HIGH (ref <0.031)
Troponin I: 0.04 ng/mL — ABNORMAL HIGH (ref <0.031)

## 2015-08-29 LAB — SEDIMENTATION RATE: Sed Rate: 90 mm/hr — ABNORMAL HIGH (ref 0–16)

## 2015-08-29 LAB — TSH: TSH: 1.709 u[IU]/mL (ref 0.350–4.500)

## 2015-08-29 LAB — C-REACTIVE PROTEIN: CRP: 17.1 mg/dL — ABNORMAL HIGH (ref <1.0)

## 2015-08-29 LAB — PHOSPHORUS: Phosphorus: 3.8 mg/dL (ref 2.5–4.6)

## 2015-08-29 LAB — MAGNESIUM: Magnesium: 1.6 mg/dL — ABNORMAL LOW (ref 1.7–2.4)

## 2015-08-29 LAB — MRSA PCR SCREENING: MRSA by PCR: NEGATIVE

## 2015-08-29 MED ORDER — LABETALOL HCL 5 MG/ML IV SOLN
10.0000 mg | INTRAVENOUS | Status: DC | PRN
  Administered 2015-08-29 (×2): 10 mg via INTRAVENOUS
  Filled 2015-08-29 (×2): qty 4

## 2015-08-29 MED ORDER — SODIUM CHLORIDE 0.9 % IV SOLN
INTRAVENOUS | Status: DC
  Administered 2015-08-29 – 2015-08-31 (×2): via INTRAVENOUS
  Administered 2015-09-01: 1000 mL via INTRAVENOUS
  Administered 2015-09-03 – 2015-09-08 (×7): via INTRAVENOUS

## 2015-08-29 MED ORDER — AMLODIPINE BESYLATE 10 MG PO TABS
10.0000 mg | ORAL_TABLET | Freq: Every day | ORAL | Status: DC
  Administered 2015-08-29 – 2015-09-08 (×10): 10 mg via ORAL
  Filled 2015-08-29 (×10): qty 1

## 2015-08-29 MED ORDER — TIZANIDINE HCL 4 MG PO TABS
4.0000 mg | ORAL_TABLET | Freq: Three times a day (TID) | ORAL | Status: DC
  Filled 2015-08-29 (×3): qty 1

## 2015-08-29 MED ORDER — TAMSULOSIN HCL 0.4 MG PO CAPS
0.4000 mg | ORAL_CAPSULE | Freq: Every day | ORAL | Status: DC
  Administered 2015-08-29 – 2015-09-08 (×10): 0.4 mg via ORAL
  Filled 2015-08-29 (×10): qty 1

## 2015-08-29 MED ORDER — LIRAGLUTIDE 18 MG/3ML ~~LOC~~ SOPN
1.2000 mg | PEN_INJECTOR | Freq: Every day | SUBCUTANEOUS | Status: DC
  Filled 2015-08-29: qty 3

## 2015-08-29 MED ORDER — HYDROCODONE-ACETAMINOPHEN 5-325 MG PO TABS: 1.0000 | ORAL_TABLET | ORAL | Status: DC | PRN

## 2015-08-29 MED ORDER — POLYETHYLENE GLYCOL 3350 17 G PO PACK
17.0000 g | PACK | Freq: Two times a day (BID) | ORAL | Status: DC
  Administered 2015-08-29 – 2015-09-09 (×18): 17 g via ORAL
  Filled 2015-08-29 (×18): qty 1

## 2015-08-29 MED ORDER — MOMETASONE FURO-FORMOTEROL FUM 200-5 MCG/ACT IN AERO
2.0000 | INHALATION_SPRAY | Freq: Two times a day (BID) | RESPIRATORY_TRACT | Status: DC
  Administered 2015-08-29 – 2015-09-09 (×21): 2 via RESPIRATORY_TRACT
  Filled 2015-08-29: qty 8.8

## 2015-08-29 MED ORDER — LIRAGLUTIDE 18 MG/3ML ~~LOC~~ SOPN: 1.2000 mg | PEN_INJECTOR | Freq: Every day | SUBCUTANEOUS | Status: DC

## 2015-08-29 MED ORDER — INSULIN ASPART 100 UNIT/ML ~~LOC~~ SOLN
0.0000 [IU] | SUBCUTANEOUS | Status: DC
  Administered 2015-08-29: 11 [IU] via SUBCUTANEOUS
  Administered 2015-08-29: 15 [IU] via SUBCUTANEOUS
  Administered 2015-08-30: 5 [IU] via SUBCUTANEOUS
  Administered 2015-08-30: 3 [IU] via SUBCUTANEOUS

## 2015-08-29 MED ORDER — SENNOSIDES-DOCUSATE SODIUM 8.6-50 MG PO TABS
2.0000 | ORAL_TABLET | Freq: Two times a day (BID) | ORAL | Status: DC
  Administered 2015-08-29 – 2015-09-09 (×21): 2 via ORAL
  Filled 2015-08-29 (×21): qty 2

## 2015-08-29 MED ORDER — METOPROLOL TARTRATE 50 MG PO TABS
50.0000 mg | ORAL_TABLET | Freq: Two times a day (BID) | ORAL | Status: DC
  Administered 2015-08-29 – 2015-09-02 (×8): 50 mg via ORAL
  Filled 2015-08-29 (×8): qty 1

## 2015-08-29 MED ORDER — ASPIRIN EC 81 MG PO TBEC
81.0000 mg | DELAYED_RELEASE_TABLET | Freq: Every day | ORAL | Status: DC
  Administered 2015-08-29 – 2015-09-09 (×10): 81 mg via ORAL
  Filled 2015-08-29 (×12): qty 1

## 2015-08-29 MED ORDER — SODIUM CHLORIDE 0.9% FLUSH
3.0000 mL | Freq: Two times a day (BID) | INTRAVENOUS | Status: DC
  Administered 2015-08-29 – 2015-09-02 (×5): 3 mL via INTRAVENOUS

## 2015-08-29 MED ORDER — GADOBENATE DIMEGLUMINE 529 MG/ML IV SOLN
20.0000 mL | Freq: Once | INTRAVENOUS | Status: AC
  Administered 2015-08-29: 20 mL via INTRAVENOUS

## 2015-08-29 MED ORDER — CALCITRIOL 0.25 MCG PO CAPS
0.2500 ug | ORAL_CAPSULE | ORAL | Status: DC
  Administered 2015-08-30 – 2015-09-09 (×5): 0.25 ug via ORAL
  Filled 2015-08-29 (×7): qty 1

## 2015-08-29 MED ORDER — LIRAGLUTIDE 18 MG/3ML ~~LOC~~ SOPN
1.2000 mg | PEN_INJECTOR | Freq: Every day | SUBCUTANEOUS | Status: DC
Start: 2015-08-30 — End: 2015-09-02
  Administered 2015-08-30 – 2015-09-01 (×3): 1.2 mg via SUBCUTANEOUS
  Filled 2015-08-29: qty 3

## 2015-08-29 MED ORDER — SIMVASTATIN 40 MG PO TABS: 40.0000 mg | ORAL_TABLET | Freq: Every day | ORAL | Status: DC

## 2015-08-29 MED ORDER — IPRATROPIUM-ALBUTEROL 0.5-2.5 (3) MG/3ML IN SOLN
3.0000 mL | Freq: Three times a day (TID) | RESPIRATORY_TRACT | Status: DC
  Administered 2015-08-29 – 2015-09-02 (×12): 3 mL via RESPIRATORY_TRACT
  Filled 2015-08-29 (×13): qty 3

## 2015-08-29 MED ORDER — MIDAZOLAM HCL 2 MG/2ML IJ SOLN
2.0000 mg | Freq: Once | INTRAMUSCULAR | Status: AC | PRN
  Administered 2015-08-29: 2 mg via INTRAVENOUS
  Filled 2015-08-29: qty 2

## 2015-08-29 MED ORDER — ACETAMINOPHEN 650 MG RE SUPP: 650.0000 mg | Freq: Four times a day (QID) | RECTAL | Status: DC | PRN

## 2015-08-29 MED ORDER — ALLOPURINOL 100 MG PO TABS
300.0000 mg | ORAL_TABLET | Freq: Every day | ORAL | Status: DC
  Administered 2015-08-29 – 2015-09-09 (×11): 300 mg via ORAL
  Filled 2015-08-29: qty 1
  Filled 2015-08-29 (×4): qty 3
  Filled 2015-08-29: qty 1
  Filled 2015-08-29: qty 3
  Filled 2015-08-29: qty 1
  Filled 2015-08-29: qty 3
  Filled 2015-08-29 (×3): qty 1

## 2015-08-29 MED ORDER — SODIUM CHLORIDE 0.9 % IV SOLN
INTRAVENOUS | Status: AC
  Administered 2015-08-29: 01:00:00 via INTRAVENOUS

## 2015-08-29 MED ORDER — ONDANSETRON HCL 4 MG/2ML IJ SOLN
4.0000 mg | Freq: Four times a day (QID) | INTRAMUSCULAR | Status: DC | PRN
  Administered 2015-08-30 – 2015-09-02 (×3): 4 mg via INTRAVENOUS
  Filled 2015-08-29 (×2): qty 2

## 2015-08-29 MED ORDER — GLUCERNA SHAKE PO LIQD
237.0000 mL | Freq: Three times a day (TID) | ORAL | Status: DC
  Administered 2015-08-30 – 2015-09-09 (×20): 237 mL via ORAL
  Filled 2015-08-29 (×21): qty 237

## 2015-08-29 MED ORDER — INSULIN GLARGINE 100 UNIT/ML ~~LOC~~ SOLN
5.0000 [IU] | Freq: Every day | SUBCUTANEOUS | Status: DC
  Administered 2015-08-29: 5 [IU] via SUBCUTANEOUS
  Filled 2015-08-29 (×2): qty 0.05

## 2015-08-29 MED ORDER — ACETAMINOPHEN 325 MG PO TABS
650.0000 mg | ORAL_TABLET | Freq: Four times a day (QID) | ORAL | Status: DC | PRN
  Administered 2015-08-29 – 2015-09-04 (×4): 650 mg via ORAL
  Filled 2015-08-29 (×4): qty 2

## 2015-08-29 MED ORDER — ONDANSETRON HCL 4 MG PO TABS
4.0000 mg | ORAL_TABLET | Freq: Four times a day (QID) | ORAL | Status: DC | PRN
  Administered 2015-09-01 – 2015-09-07 (×2): 4 mg via ORAL
  Filled 2015-08-29 (×2): qty 1

## 2015-08-29 MED ORDER — ATORVASTATIN CALCIUM 10 MG PO TABS
20.0000 mg | ORAL_TABLET | Freq: Every day | ORAL | Status: DC
  Administered 2015-08-29 – 2015-09-08 (×10): 20 mg via ORAL
  Filled 2015-08-29: qty 1
  Filled 2015-08-29 (×2): qty 2
  Filled 2015-08-29 (×2): qty 1
  Filled 2015-08-29: qty 2
  Filled 2015-08-29: qty 1
  Filled 2015-08-29: qty 2
  Filled 2015-08-29 (×2): qty 1

## 2015-08-29 MED ORDER — PILOCARPINE HCL 2 % OP SOLN
1.0000 [drp] | Freq: Three times a day (TID) | OPHTHALMIC | Status: DC
  Administered 2015-08-29 – 2015-09-09 (×30): 1 [drp] via OPHTHALMIC
  Filled 2015-08-29 (×2): qty 15

## 2015-08-29 MED ORDER — FINASTERIDE 5 MG PO TABS
5.0000 mg | ORAL_TABLET | Freq: Every day | ORAL | Status: DC
  Administered 2015-08-29 – 2015-09-08 (×10): 5 mg via ORAL
  Filled 2015-08-29 (×10): qty 1

## 2015-08-29 MED ORDER — IPRATROPIUM-ALBUTEROL 0.5-2.5 (3) MG/3ML IN SOLN
3.0000 mL | Freq: Three times a day (TID) | RESPIRATORY_TRACT | Status: DC
  Administered 2015-08-29: 3 mL via RESPIRATORY_TRACT
  Filled 2015-08-29: qty 3

## 2015-08-29 MED ORDER — MONTELUKAST SODIUM 10 MG PO TABS
10.0000 mg | ORAL_TABLET | Freq: Every day | ORAL | Status: DC
  Administered 2015-08-29 – 2015-09-08 (×10): 10 mg via ORAL
  Filled 2015-08-29 (×10): qty 1

## 2015-08-29 MED ORDER — METHYLPREDNISOLONE SODIUM SUCC 40 MG IJ SOLR
10.0000 mg | Freq: Every day | INTRAMUSCULAR | Status: DC
  Administered 2015-08-29 – 2015-08-30 (×2): 10 mg via INTRAVENOUS
  Filled 2015-08-29 (×2): qty 1

## 2015-08-29 MED ORDER — CYCLOSPORINE 0.05 % OP EMUL
1.0000 [drp] | Freq: Two times a day (BID) | OPHTHALMIC | Status: DC
  Administered 2015-08-29 – 2015-09-09 (×21): 1 [drp] via OPHTHALMIC
  Filled 2015-08-29 (×27): qty 1

## 2015-08-29 MED ORDER — SODIUM CHLORIDE 0.9 % IV BOLUS (SEPSIS)
500.0000 mL | Freq: Once | INTRAVENOUS | Status: AC
  Administered 2015-08-29: 500 mL via INTRAVENOUS

## 2015-08-29 MED ORDER — FENTANYL CITRATE (PF) 100 MCG/2ML IJ SOLN
25.0000 ug | Freq: Once | INTRAMUSCULAR | Status: DC | PRN
  Filled 2015-08-29: qty 2

## 2015-08-29 MED ORDER — PREDNISONE 5 MG PO TABS
7.0000 mg | ORAL_TABLET | Freq: Every day | ORAL | Status: DC
  Administered 2015-08-29 – 2015-09-09 (×12): 7 mg via ORAL
  Filled 2015-08-29 (×16): qty 2

## 2015-08-29 MED ORDER — INSULIN GLARGINE 100 UNIT/ML ~~LOC~~ SOLN
10.0000 [IU] | Freq: Every day | SUBCUTANEOUS | Status: DC
  Administered 2015-08-29: 10 [IU] via SUBCUTANEOUS
  Filled 2015-08-29 (×2): qty 0.1

## 2015-08-29 NOTE — Telephone Encounter (Signed)
Verbal OK for occupational therapy for patient yet looks like patient readmitted.

## 2015-08-29 NOTE — Progress Notes (Signed)
PROGRESS NOTE    Jacob [name]  [id number] DOB: 01-12-1941 DOA: 08/28/2015 PCP: [name], MD   Brief Narrative:  75 y.o. WM PMHx Anxiety, Lumbar stenosis S/P laminectomy and Discectomy May 2017, COPD, RA, HTN, OSA, CAD native artery S/P CABG 7 vessel, DM Type 2 uncontrolled with complication, DM neuropathy, PMR, CKD, Cancer of kidney, Hepatitis   Presented with worsening encephalopathy for the past 24 hours has been doing worse overall. He was discharged to home on June 8th. On 10th of June he started to get progressively worse. He was seen in office by Dr. Christella Noa and was doing better. But started to threaten family he had psych eval at [facility] and was told to go to PCP. Today they went to ER at [facility] care. He was seen in [place] ED. patient had extensive workup chest x-ray was unremarkable UA was concentrated but no evidence of UTI. CT scan of the head was non-acute. U tox positive for benzos and opioids. Troponin was slightly elevated but no EKG changes suggestive of ischemia. They were planing to do MRI to see what is going on he was given something for anxiety and have been lethargic since. where he have gone imaging of his back which was inconclusive for infection versus surgical changes but one does not appear to be infected. He was noted to have elevated LFTs ultrasound was done showing no evidence of cholecystitis. Creatinine was found to be 1.3 which is above his baseline. Chest x-ray while in the facility was unremarkable. Patient was accepted in transfer to stepdown to [facility] Family reports that he has not been eating and drinking for past 3-4 days. He has not taken much of his home medications he did have a dose of Percocet this morning and Flexeril.   Regarding pertinent Chronic problems: In May patient had a fall in the bathtub MRI of the back show large herniated nucleus pulposus at L3-4 with severe lumbar stenosis. He has undergone lumbar laminectomy discectomy at  the level L3/4 by Dr. Christella Noa In May this was complicated by pneumonia acute renal failure she was discharged to nursing home [facility] place and 26th of May Blood pressure at baseline controlled with amlodipine and Lopressor While in the nursing home he finished Augmentin for his age Pneumonia She has history of polymyalgia rheumatica for which she takes prednisone 7 mg daily Last echogram done in May 2017 showing preserved EF no evidence of diastolic dysfunction   Assessment & Plan:   Active Problems:   CAD (coronary artery disease)   Diabetes mellitus, type 2 (HCC)   Neuropathy (HCC)   Rheumatoid arthritis ([place])   CHF with left ventricular diastolic dysfunction, NYHA class 2 (HCC)   OSA (obstructive sleep apnea)   Altered mental status   History of COPD   Spinal stenosis of lumbar region with radiculopathy   Renal cancer (HCC)   Acute encephalopathy   Elevated troponin   Elevated LFTs   CAD in native artery   Uncontrolled type 2 diabetes mellitus with complication (HCC)   Rheumatoid arthritis involving vertebra with positive rheumatoid factor ([place])   Spinal stenosis of lumbar region   Acute encephalopathy  - the setting of dehydration and polypharmacy.  -Ammonia level unremarkable ABG unremarkable -MRI brain; nondiagnostic see results below -Hold narcotics, Zanaflex;,all sedating medication.  -if patient experiences pain/muscle spasms contact on call physician for medication -Normal saline 111ml/hr patient's mucous membranes clearly exhibit signs consistent with dehydration  CAD (coronary artery disease)native artery  -Metoprolol 50  mg BID -Labetalol IV PRN   CHF with left ventricular diastolic dysfunction, NYHA class 2  - last echogram was done in May 2017 show no evidence of CHF. -repeat echocardiogram pending  -strict in and out -Daily weight Filed Weights   08/28/15 2220  Weight: 95.9 kg (211 lb 6.7 oz)   OSA (obstructive sleep apnea)  -patient states has  CPAP machine at home but unable to use secondary to claustrophobia. -Titrate O2 to maintain SPO2> 93%    Rheumatoid arthritis  -prednisone 7 mg daily - Spinal stenosis of lumbar region with radiculopathy status post laminectomy  -questionable changes on MRI. Infection vs expected post surgical changes? Will await neurosurgery input. -patient stated had extreme pain at home currently asymptomatic  Renal cancer  -S/P Ablation, stable   Elevated troponin  -asymptomatic most likely secondary to demand ischemia.   Diabetes mellitus type 2 uncontrolled with complications -[id number] units daily -Moderate SSI  Elevated LFTs - outside hospital. Obtained ultrasound that showed no evidence of gallstone disease or cholecystitis possible dehydration related    DVT prophylaxis: SCD Code Status: Full Family Communication; none Disposition Plan: resolution of acute encephalopathy//increased  L-spine pain    Consultants:  Neurosurgery  Pending; spoke with Dr. Ashok Pall office    Procedures/Significant Events:  6/15 MRI brain;negative acute CVA-mild to moderate  Chronic small vessel ischemic disease 6/15 L spine W/WO contrast;-Postoperative changes at the L3-L4 level with superimposed large cephalad disc extrusion causing severe spinal stenosis, as well as abnormal fluid signal in the L3-L4 disc space associated with mild L4 superior endplate fracture with L3 and L4 endplate edema/inflammation.  -Mild associated psoas muscle inflammation. -Discitis/ osteomyelitis at L3-L4?? -Small postoperative fluid collection overlying the L3 spinous process, favor seroma.  Cultures NA  Antimicrobials: NA   Devices    LINES / TUBES:  NA    Continuous Infusions: . sodium chloride       Subjective: 6/15 A/O 3 (does not know where). States recalls threatening his wife as well as threatening to harm himself secondary to excruciating L-spine pain. Does not recall how much additional  medication he used.     Objective: Filed Vitals:   08/29/15 0821 08/29/15 0841 08/29/15 1336 08/29/15 1600  BP:  156/82 157/87 166/97  Pulse:  105 109 114  Temp:  99.2 F (37.3 C) 99 F (37.2 C) 97.4 F (36.3 C)  TempSrc:  Oral Oral Oral  Resp:  20 22 27   Height:      Weight:      SpO2: 100% 98% 98% 99%    Intake/Output Summary (Last 24 hours) at 08/29/15 1634 Last data filed at 08/29/15 0700  Gross per 24 hour  Intake 505.83 ml  Output      0 ml  Net 505.83 ml   Filed Weights   08/28/15 2220  Weight: 95.9 kg (211 lb 6.7 oz)    Examination:  General:A/O 3 (does not know where),sleepy but follows all commands, No acute respiratory distress Eyes: negative scleral hemorrhage, negative anisocoria, negative icterus ENT: Negative Runny nose, negative gingival bleeding, Neck:  Negative scars, masses, torticollis, lymphadenopathy, JVD Lungs: Clear to auscultation bilaterally without wheezes or crackles Cardiovascular: Regular rate and rhythm without murmur gallop or rub normal S1 and S2 Abdomen: negative abdominal pain, nondistended, positive soft, bowel sounds, no rebound, no ascites, no appreciable mass Extremities: No significant cyanosis, clubbing, or edema bilateral lower extremities Skin: well-healed incision medial aspect of LLE Psychiatric:  Negative depression, negative anxiety, negative fatigue,  negative mania  Central nervous system:  Cranial nerves II through XII intact, tongue/uvula midline, all extremities muscle strength 5/5, sensation intact throughout,negative dysarthria, negative expressive aphasia, negative receptive aphasia.      Data Reviewed: Care during the described time interval was provided by me .  I have reviewed this patient's available data, including medical history, events of note, physical examination, and all test results as part of my evaluation. I have personally reviewed and interpreted all radiology studies.  CBC:  Recent Labs Lab  2015-09-08 2312 08/29/15 1034  WBC 6.4 6.3  NEUTROABS 3.8  --   HGB 11.8* 11.3*  HCT 37.5* 36.2*  MCV 95.2 95.5  PLT 254 [id number]   Basic Metabolic Panel:  Recent Labs Lab 09-08-2015 2312 08/29/15 1034  NA 138 140  K 3.5 3.5  CL 103 106  CO2 24 25  GLUCOSE 237* 184*  BUN 13 12  CREATININE 1.61* 1.44*  CALCIUM 9.2 9.0  MG  --  1.6*  PHOS  --  3.8   GFR: Estimated Creatinine Clearance: 52.3 mL/min (by C-G formula based on Cr of 1.44). Liver Function Tests:  Recent Labs Lab September 08, 2015 2312 08/29/15 1034  AST 39 31  ALT 72* 61  ALKPHOS 461* 407*  BILITOT 1.4* 1.2  PROT 6.3* 5.9*  ALBUMIN 2.8* 2.6*   No results for input(s): LIPASE, AMYLASE in the last 168 hours.  Recent Labs Lab 2015/09/08 2313  AMMONIA 28   Coagulation Profile: No results for input(s): INR, PROTIME in the last 168 hours. Cardiac Enzymes:  Recent Labs Lab 2015/09/08 2312 08/29/15 1034  TROPONINI 0.05* 0.04*  0.04*   BNP (last 3 results) No results for input(s): PROBNP in the last 8760 hours. HbA1C: No results for input(s): HGBA1C in the last 72 hours. CBG:  Recent Labs Lab 2015-09-08 2241 08/29/15 0503 08/29/15 1329  GLUCAP 233* 203* 223*   Lipid Profile: No results for input(s): CHOL, HDL, LDLCALC, TRIG, CHOLHDL, LDLDIRECT in the last 72 hours. Thyroid Function Tests:  Recent Labs  08/29/15 1034  TSH 1.709   Anemia Panel: No results for input(s): VITAMINB12, FOLATE, FERRITIN, TIBC, IRON, RETICCTPCT in the last 72 hours. Urine analysis:    Component Value Date/Time   BILIRUBINUR neg 09/09/2013 1233   PROTEINUR >=300 09/09/2013 1233   UROBILINOGEN 0.2 09/09/2013 1233   NITRITE neg 09/09/2013 1233   LEUKOCYTESUR Negative 09/09/2013 1233   Sepsis Labs: @LABRCNTIP (procalcitonin:4,lacticidven:4)  ) Recent Results (from the past 240 hour(s))  MRSA PCR Screening     Status: None   Collection Time: 08/29/15  2:20 PM  Result Value Ref Range Status   MRSA by PCR NEGATIVE NEGATIVE  Final    Comment:        The GeneXpert MRSA Assay (FDA approved for NASAL specimens only), is one component of a comprehensive MRSA colonization surveillance program. It is not intended to diagnose MRSA infection nor to guide or monitor treatment for MRSA infections.          Radiology Studies: Mr Brain Wo Contrast  08/29/2015  CLINICAL DATA:  Altered mental status. Patient threatening himself and others. Lethargy. Evaluation for stroke. EXAM: MRI HEAD WITHOUT CONTRAST TECHNIQUE: Multiplanar, multiecho pulse sequences of the brain and surrounding structures were obtained without intravenous contrast. COMPARISON:  08/03/2015 head CT FINDINGS: The study is mildly motion degraded. There is no evidence of acute infarct, intracranial hemorrhage, mass, midline shift, or extra-axial fluid collection. Cerebral atrophy is within normal limits for age. Patchy cerebral white matter  T2 hyperintensities are nonspecific but compatible with mild-to-moderate chronic small vessel ischemic disease. Prior bilateral cataract extraction is noted. Paranasal sinuses and mastoid air cells are clear. Major intracranial vascular flow voids are preserved. IMPRESSION: 1. No acute intracranial abnormality. 2. Mild-to-moderate chronic small vessel ischemic disease. Electronically Signed   By: Logan Bores M.D.   On: 08/29/2015 13:03   Mr Lumbar Spine W Wo Contrast  08/29/2015  CLINICAL DATA:  75 year old male with lumbar back pain radiating to the left lower extremity status post lumbar spine surgery 07/30/2015. Subsequent encounter. EXAM: MRI LUMBAR SPINE WITHOUT AND WITH CONTRAST TECHNIQUE: Multiplanar and multiecho pulse sequences of the lumbar spine were obtained without and with intravenous contrast. CONTRAST:  56mL MULTIHANCE GADOBENATE DIMEGLUMINE 529 MG/ML IV SOLN COMPARISON:  Outside preoperative lumbar MRI 07/24/2015. Intraoperative radiographs 07/30/2015. FINDINGS: Segmentation:  Normal as seen on the  intraoperative radiographs. Alignment: Interval mild L4 superior endplate deformity with mild (5-10%) loss of L4 vertebral body height. Associated mild endplate edema there are and at the adjacent L3 inferior endplate. Postoperative changes at this level further described below. Stable vertebral height and alignment elsewhere. Vertebrae: Endplate marrow edema at L3 and L4 as stated. Stable and normal bone marrow signal elsewhere. Conus medullaris: Extends to the L1-L2 level and appears normal. No lower thoracic spinal cord signal abnormality. Paraspinal and other soft tissues: Postoperative changes in the posterior paraspinal soft tissues from the L2 spinous process level to the L4 spinous process level. Medial bilateral erector spinae muscle edema and enhancement. Enhancement tracks from the skin surface to the epidural space at L3-L4. There is a a small 15 mm fluid collection overlying the L3 spinous process. See also L3-L4 disc space findings below. No definite prevertebral soft tissue inflammation. There is mild right greater than left medial psoas muscle inflammation and enhancement (series 6, image 1). Disc levels: The disc space levels L1-L2 and above are stable. The L4-L5 and L5-S1 levels are stable. L2-L3:  Stable. L3-L4: Large cephalad disc extrusion with disc fragment encompassing 7 x 11 x 24 mm (AP by transverse by CC). See series 9, image 12 and series 4, image 8. The L3-L4 disc space now appears to be fluid-filled. The endplates are mildly edematous and enhancing. Moderate facet and ligament flavum hypertrophy Re demonstrated. Postoperative changes possibly to the ligament flavum. Subsequent severe spinal stenosis (series 9, image 14). IMPRESSION: 1. Postoperative changes at the L3-L4 level with superimposed large cephalad disc extrusion (series 4, image 8) causing severe spinal stenosis, as well as abnormal fluid signal in the L3-L4 disc space associated with mild L4 superior endplate fracture with L3  and L4 endplate edema/inflammation. Mild associated psoas muscle inflammation. 2. The appearance is such that discitis osteomyelitis at L3-L4 cannot be excluded. 3. There is also a small postoperative fluid collection overlying the L3 spinous process, favor seroma. Electronically Signed   By: Genevie Ann M.D.   On: 08/29/2015 13:39        Scheduled Meds: . allopurinol  300 mg Oral Q lunch  . amLODipine  10 mg Oral Q supper  . antiseptic oral rinse  7 mL Mouth Rinse BID  . aspirin EC  81 mg Oral Q lunch  . atorvastatin  20 mg Oral q1800  . [START ON 08/30/2015] calcitRIOL  0.25 mcg Oral Q M,W,F  . cycloSPORINE  1 drop Both Eyes BID  . feeding supplement (GLUCERNA SHAKE)  237 mL Oral TID BM  . finasteride  5 mg Oral QHS  .  insulin aspart  0-15 Units Subcutaneous Q4H  . insulin glargine  10 Units Subcutaneous QHS  . ipratropium-albuterol  3 mL Nebulization TID  . Liraglutide  1.2 mg Subcutaneous Q1500  . methylPREDNISolone (SOLU-MEDROL) injection  10 mg Intravenous Q breakfast  . metoprolol  50 mg Oral BID  . mometasone-formoterol  2 puff Inhalation BID  . montelukast  10 mg Oral QHS  . pilocarpine  1 drop Both Eyes TID  . polyethylene glycol  17 g Oral BID  . predniSONE  7 mg Oral Q breakfast  . senna-docusate  2 tablet Oral BID  . sodium chloride flush  3 mL Intravenous Q12H  . tamsulosin  0.4 mg Oral QHS   Continuous Infusions: . sodium chloride       LOS: 1 day    Time spent: 40 minutes    WOODS, Geraldo Docker, MD [facility] Pager [phone]   If 7PM-7AM, please contact night-coverage [email or web address] Password [facility] 08/29/2015, 4:34 PM

## 2015-08-29 NOTE — Telephone Encounter (Signed)
Herbert Deaner, physical therapist, is calling to request a verbal order for 1 visit after being discharged from [facility]. Clair Gulling states that someone who works for his office got the order authorized but he wants to confirm. Please call!  [phone]

## 2015-08-29 NOTE — Telephone Encounter (Signed)
Left detailed VM on Jim Hoffman's VM regarding verbal order. Verbal order was given by Dr. Tamala Julian on 08/28/15.

## 2015-08-29 NOTE — Progress Notes (Addendum)
Initial Nutrition Assessment  DOCUMENTATION CODES:   Not applicable  INTERVENTION:   -Glucerna Shake po TID, each supplement provides 220 kcal and 10 grams of protein  NUTRITION DIAGNOSIS:   Predicted suboptimal nutrient intake related to poor appetite, lethargy/confusion as evidenced by percent weight loss.  GOAL:   Patient will meet greater than or equal to 90% of their needs  MONITOR:   PO intake, Supplement acceptance, Labs, Weight trends, Skin, I & O's  REASON FOR ASSESSMENT:   Consult, Malnutrition Screening Tool Assessment of nutrition requirement/status  ASSESSMENT:   75 y.o. male with medical history significant of lumbar stenosis Status post laminectomy, COPD, rheumatoid arthritis, hypertension, sleep apnea, CAD, DM 2, neuropathy, PMR, Here with acute encephalopathy in a setting of dehydration   Pt admitted with acute encephalopathy and dehydration.   Attempted to examine pt x 2, however, pt out of room for procedures at times of visit. Unable to complete Nutrition-Focused physical exam at this time.   Observed breakfast tray at bedside. Solid foods were untouched. Pt consumed approximately 25% of orange juice.   Per H&P, pt has not been eating or drinking over the past 3-4 days PTA. Reviewed wt hx; noted pt has experienced a 9.4% wt loss over the past month and 12.4% wt loss over the past 5 months. UBW appears to be around 240#.    RD suspects pt may have some degree of malnutrition, however, unable to confirm at this time. Due to decreased PO intake, RD will add supplements in attempt to improve nutritional intake.   Labs reviewed: CBGS: 223.   Diet Order:  Diet Carb Modified Fluid consistency:: Thin; Room service appropriate?: Yes  Skin:  Reviewed, no issues  Last BM:  PTA  Height:   Ht Readings from Last 1 Encounters:  08/28/15 5\' 11"  (1.803 m)    Weight:   Wt Readings from Last 1 Encounters:  08/28/15 211 lb 6.7 oz (95.9 kg)    Ideal Body  Weight:  78.2 kg  BMI:  Body mass index is 29.5 kg/(m^2).  Estimated Nutritional Needs:   Kcal:  2100-2300  Protein:  115-130 grams  Fluid:  2.1-2.3 L  EDUCATION NEEDS:   No education needs identified at this time  Loie Jahr A. Jimmye Norman, RD, LDN, CDE Pager: [phone] After hours Pager: [phone]

## 2015-08-29 NOTE — Care Management Note (Signed)
Case Management Note  Patient Details  Name: Jacob [name] MRN: [name] Date of Birth: 1940-04-13  Subjective/Objective:  Pt admitted with altered mental status. He is from home with family. Pt has [place] insurance. CM spoke with [place] at [facility] and informed her of his admission. Pts CSW through the [place] is Anitra Lauth and her pager is [phone] for discharge needs.                    Action/Plan: Awaiting medical work up. CM following for discharge disposition.   Expected Discharge Date:                  Expected Discharge Plan:     In-House Referral:     Discharge planning Services  CM Consult  Post Acute Care Choice:    Choice offered to:     DME Arranged:    DME Agency:     HH Arranged:    HH Agency:     Status of Service:  In process, will continue to follow  Medicare Important Message Given:    Date Medicare IM Given:    Medicare IM give by:    Date Additional Medicare IM Given:    Additional Medicare Important Message give by:     If discussed at [place] of Stay Meetings, dates discussed:    Additional Comments:  Pollie Friar, RN 08/29/2015, 10:52 AM

## 2015-08-29 NOTE — Telephone Encounter (Signed)
Ruth advised

## 2015-08-29 NOTE — Progress Notes (Signed)
  Echocardiogram 2D Echocardiogram has been performed.  [name] M 08/29/2015, 3:49 PM

## 2015-08-30 ENCOUNTER — Inpatient Hospital Stay (HOSPITAL_COMMUNITY): Payer: PPO

## 2015-08-30 DIAGNOSIS — M79604 Pain in right leg: Secondary | ICD-10-CM | POA: Diagnosis not present

## 2015-08-30 DIAGNOSIS — M353 Polymyalgia rheumatica: Secondary | ICD-10-CM

## 2015-08-30 DIAGNOSIS — Z7952 Long term (current) use of systemic steroids: Secondary | ICD-10-CM

## 2015-08-30 DIAGNOSIS — R7989 Other specified abnormal findings of blood chemistry: Secondary | ICD-10-CM | POA: Diagnosis not present

## 2015-08-30 DIAGNOSIS — Z9889 Other specified postprocedural states: Secondary | ICD-10-CM

## 2015-08-30 DIAGNOSIS — I5030 Unspecified diastolic (congestive) heart failure: Secondary | ICD-10-CM | POA: Diagnosis not present

## 2015-08-30 DIAGNOSIS — R4182 Altered mental status, unspecified: Secondary | ICD-10-CM | POA: Diagnosis not present

## 2015-08-30 DIAGNOSIS — M79605 Pain in left leg: Secondary | ICD-10-CM | POA: Diagnosis not present

## 2015-08-30 DIAGNOSIS — M4646 Discitis, unspecified, lumbar region: Secondary | ICD-10-CM

## 2015-08-30 DIAGNOSIS — E119 Type 2 diabetes mellitus without complications: Secondary | ICD-10-CM | POA: Diagnosis not present

## 2015-08-30 DIAGNOSIS — F4323 Adjustment disorder with mixed anxiety and depressed mood: Secondary | ICD-10-CM | POA: Diagnosis not present

## 2015-08-30 DIAGNOSIS — E118 Type 2 diabetes mellitus with unspecified complications: Secondary | ICD-10-CM | POA: Diagnosis not present

## 2015-08-30 DIAGNOSIS — F4320 Adjustment disorder, unspecified: Secondary | ICD-10-CM | POA: Diagnosis not present

## 2015-08-30 DIAGNOSIS — G9340 Encephalopathy, unspecified: Secondary | ICD-10-CM | POA: Diagnosis not present

## 2015-08-30 DIAGNOSIS — I2510 Atherosclerotic heart disease of native coronary artery without angina pectoris: Secondary | ICD-10-CM | POA: Diagnosis not present

## 2015-08-30 DIAGNOSIS — M069 Rheumatoid arthritis, unspecified: Secondary | ICD-10-CM

## 2015-08-30 LAB — CBC
HCT: 34.8 % — ABNORMAL LOW (ref 39.0–52.0)
Hemoglobin: 10.9 g/dL — ABNORMAL LOW (ref 13.0–17.0)
MCH: 29.2 pg (ref 26.0–34.0)
MCHC: 31.3 g/dL (ref 30.0–36.0)
MCV: 93.3 fL (ref 78.0–100.0)
Platelets: 248 10*3/uL (ref 150–400)
RBC: 3.73 MIL/uL — ABNORMAL LOW (ref 4.22–5.81)
RDW: 14.6 % (ref 11.5–15.5)
WBC: 6 10*3/uL (ref 4.0–10.5)

## 2015-08-30 LAB — COMPREHENSIVE METABOLIC PANEL
ALT: 53 U/L (ref 17–63)
AST: 25 U/L (ref 15–41)
Albumin: 2.4 g/dL — ABNORMAL LOW (ref 3.5–5.0)
Alkaline Phosphatase: 384 U/L — ABNORMAL HIGH (ref 38–126)
Anion gap: 8 (ref 5–15)
BUN: 16 mg/dL (ref 6–20)
CO2: 26 mmol/L (ref 22–32)
Calcium: 9.1 mg/dL (ref 8.9–10.3)
Chloride: 106 mmol/L (ref 101–111)
Creatinine, Ser: 1.35 mg/dL — ABNORMAL HIGH (ref 0.61–1.24)
GFR calc Af Amer: 58 mL/min — ABNORMAL LOW (ref >60)
GFR calc non Af Amer: 50 mL/min — ABNORMAL LOW (ref >60)
Glucose, Bld: 228 mg/dL — ABNORMAL HIGH (ref 65–99)
Potassium: 3.5 mmol/L (ref 3.5–5.1)
Sodium: 140 mmol/L (ref 135–145)
Total Bilirubin: 0.8 mg/dL (ref 0.3–1.2)
Total Protein: 5.8 g/dL — ABNORMAL LOW (ref 6.5–8.1)

## 2015-08-30 LAB — TROPONIN I: Troponin I: 0.03 ng/mL (ref <0.031)

## 2015-08-30 LAB — GLUCOSE, CAPILLARY
Glucose-Capillary: 314 mg/dL — ABNORMAL HIGH (ref 65–99)
Glucose-Capillary: 209 mg/dL — ABNORMAL HIGH (ref 65–99)
Glucose-Capillary: 187 mg/dL — ABNORMAL HIGH (ref 65–99)
Glucose-Capillary: 213 mg/dL — ABNORMAL HIGH (ref 65–99)
Glucose-Capillary: 194 mg/dL — ABNORMAL HIGH (ref 65–99)
Glucose-Capillary: 251 mg/dL — ABNORMAL HIGH (ref 65–99)

## 2015-08-30 LAB — HEPATITIS PANEL, ACUTE
HCV Ab: <0.1 s/co ratio (ref 0.0–0.9)
Hep A IgM: NEGATIVE
Hep B C IgM: NEGATIVE
Hepatitis B Surface Ag: NEGATIVE

## 2015-08-30 LAB — MAGNESIUM: Magnesium: 1.7 mg/dL (ref 1.7–2.4)

## 2015-08-30 LAB — HEMOGLOBIN A1C
Hgb A1c MFr Bld: 6.8 % — ABNORMAL HIGH (ref 4.8–5.6)
Mean Plasma Glucose: 148 mg/dL

## 2015-08-30 LAB — PROTIME-INR
INR: 1.02 (ref 0.00–1.49)
Prothrombin Time: 13.6 seconds (ref 11.6–15.2)

## 2015-08-30 MED ORDER — ONDANSETRON HCL 4 MG/2ML IJ SOLN
INTRAMUSCULAR | Status: AC
  Filled 2015-08-30: qty 2

## 2015-08-30 MED ORDER — FENTANYL CITRATE (PF) 100 MCG/2ML IJ SOLN
INTRAMUSCULAR | Status: AC | PRN
  Administered 2015-08-30 (×2): 25 ug via INTRAVENOUS

## 2015-08-30 MED ORDER — FENTANYL CITRATE (PF) 100 MCG/2ML IJ SOLN
INTRAMUSCULAR | Status: AC
  Filled 2015-08-30: qty 2

## 2015-08-30 MED ORDER — VANCOMYCIN HCL IN DEXTROSE 750-5 MG/150ML-% IV SOLN
750.0000 mg | Freq: Two times a day (BID) | INTRAVENOUS | Status: DC
  Administered 2015-08-31 – 2015-09-05 (×11): 750 mg via INTRAVENOUS
  Filled 2015-08-30 (×15): qty 150

## 2015-08-30 MED ORDER — CYCLOBENZAPRINE HCL 10 MG PO TABS
5.0000 mg | ORAL_TABLET | Freq: Three times a day (TID) | ORAL | Status: DC | PRN
  Administered 2015-08-30: 5 mg via ORAL
  Filled 2015-08-30: qty 1

## 2015-08-30 MED ORDER — BUPIVACAINE HCL (PF) 0.25 % IJ SOLN
INTRAMUSCULAR | Status: AC
  Filled 2015-08-30: qty 30

## 2015-08-30 MED ORDER — DEXTROSE 5 % IV SOLN
2.0000 g | INTRAVENOUS | Status: DC
  Administered 2015-08-30 – 2015-09-08 (×9): 2 g via INTRAVENOUS
  Filled 2015-08-30 (×12): qty 2

## 2015-08-30 MED ORDER — MIDAZOLAM HCL 2 MG/2ML IJ SOLN
INTRAMUSCULAR | Status: AC | PRN
  Administered 2015-08-30 (×2): 1 mg via INTRAVENOUS

## 2015-08-30 MED ORDER — INSULIN ASPART 100 UNIT/ML ~~LOC~~ SOLN
0.0000 [IU] | SUBCUTANEOUS | Status: DC
  Administered 2015-08-30: 4 [IU] via SUBCUTANEOUS
  Administered 2015-08-30: 7 [IU] via SUBCUTANEOUS
  Administered 2015-08-30: 11 [IU] via SUBCUTANEOUS
  Administered 2015-08-31: 4 [IU] via SUBCUTANEOUS
  Administered 2015-08-31: 18 [IU] via SUBCUTANEOUS
  Administered 2015-08-31: 3 [IU] via SUBCUTANEOUS
  Administered 2015-08-31: 4 [IU] via SUBCUTANEOUS
  Administered 2015-08-31: 3 [IU] via SUBCUTANEOUS
  Administered 2015-08-31: 7 [IU] via SUBCUTANEOUS
  Administered 2015-09-01: 3 [IU] via SUBCUTANEOUS
  Administered 2015-09-01: 11 [IU] via SUBCUTANEOUS
  Administered 2015-09-01: 3 [IU] via SUBCUTANEOUS
  Administered 2015-09-01: 7 [IU] via SUBCUTANEOUS
  Administered 2015-09-02: 3 [IU] via SUBCUTANEOUS
  Administered 2015-09-02: 7 [IU] via SUBCUTANEOUS
  Administered 2015-09-02 (×2): 3 [IU] via SUBCUTANEOUS
  Administered 2015-09-03 (×4): 4 [IU] via SUBCUTANEOUS
  Administered 2015-09-04 (×2): 7 [IU] via SUBCUTANEOUS
  Administered 2015-09-04: 3 [IU] via SUBCUTANEOUS

## 2015-08-30 MED ORDER — OXYCODONE HCL 5 MG PO TABS
5.0000 mg | ORAL_TABLET | Freq: Four times a day (QID) | ORAL | Status: DC | PRN
  Administered 2015-08-30: 5 mg via ORAL
  Filled 2015-08-30: qty 1

## 2015-08-30 MED ORDER — INSULIN GLARGINE 100 UNIT/ML ~~LOC~~ SOLN
15.0000 [IU] | Freq: Every day | SUBCUTANEOUS | Status: DC
  Administered 2015-08-30 – 2015-08-31 (×2): 15 [IU] via SUBCUTANEOUS
  Filled 2015-08-30 (×3): qty 0.15

## 2015-08-30 MED ORDER — SODIUM CHLORIDE 0.9 % IV SOLN
INTRAVENOUS | Status: AC
  Administered 2015-08-30: 16:00:00 via INTRAVENOUS

## 2015-08-30 MED ORDER — ONDANSETRON HCL 4 MG/2ML IJ SOLN
INTRAMUSCULAR | Status: AC
  Administered 2015-08-30: 4 mg via INTRAVENOUS
  Filled 2015-08-30: qty 2

## 2015-08-30 MED ORDER — VANCOMYCIN HCL IN DEXTROSE 1-5 GM/200ML-% IV SOLN
1000.0000 mg | Freq: Once | INTRAVENOUS | Status: AC
  Administered 2015-08-30: 1000 mg via INTRAVENOUS
  Filled 2015-08-30: qty 200

## 2015-08-30 MED ORDER — MIDAZOLAM HCL 2 MG/2ML IJ SOLN
INTRAMUSCULAR | Status: AC
  Filled 2015-08-30: qty 4

## 2015-08-30 NOTE — Consult Note (Signed)
Chief Complaint: Patient was seen in consultation today for Lumbar 3-4 disc aspiration at the request of Dr Dia Crawford  Referring Physician(s): Dr Dia Crawford  Supervising Physician: Luanne Bras  Patient Status: Inpatient  History of Present Illness: Jacob [name] is a 75 y.o. male   L3-4 diskectomy 07/30/2015 Presented with AMS and back pain 08/29/15 MRI 6/15:  IMPRESSION: 1. Postoperative changes at the L3-L4 level with superimposed large cephalad disc extrusion (series 4, image 8) causing severe spinal stenosis, as well as abnormal fluid signal in the L3-L4 disc space associated with mild L4 superior endplate fracture with L3 and L4 endplate edema/inflammation. Mild associated psoas muscle inflammation. 2. The appearance is such that discitis osteomyelitis at L3-L4 cannot be excluded. 3. There is also a small postoperative fluid collection overlying the L3 spinous process, favor seroma.  Request to perform L3-4 disc aspiration Reviewed with Dr Estanislado Pandy- approves procedure Scheduled now for same   Past Medical History  Diagnosis Date  . Allergy   . Asthma   . Diabetes mellitus without complication ([place])   . Cataract   . Arthritis   . Polymyalgia rheumatica (HCC)     maintained on Prednisone, Plaquenil. Followed by rhuematology every 4 months/[name].  Marland Kitchen COPD (chronic obstructive pulmonary disease) ([place])   . Anemia   . Hypertension   . BPH (benign prostatic hyperplasia)   . Sleep apnea     CPAP   Trying to use  . Shortness of breath dyspnea     with exertion  . Anxiety   . Chronic kidney disease     chronic  kidney failure  kidney function at 42%  . Coronary artery disease     CABG  7 bypasses  . Hepatitis     many years ago  . Cancer of kidney [facility])     Past Surgical History  Procedure Laterality Date  . Appendectomy    . Hernia repair    . Prostate surgery      TURP at [facility].  Marland Kitchen Coronary artery bypass graft  03/17/1995    Wynonia Lawman;  followed every six months.  . Cataract extraction w/phaco Right 11/28/2014    Procedure: CATARACT EXTRACTION PHACO AND INTRAOCULAR LENS PLACEMENT (IOC) RIGHT ;  Surgeon: Marylynn Pearson, MD;  Location: [place];  Service: Ophthalmology;  Laterality: Right;  . Lumbar laminectomy/decompression microdiscectomy N/A 07/30/2015    Procedure: LUMBAR LAMINECTOMY DISCECTOMY ;  Surgeon: Ashok Pall, MD;  Location: [place] [facility];  Service: Neurosurgery;  Laterality: N/A;  LUMBAR LAMINECTOMY DISCECTOMY     Allergies: Ace inhibitors; Actonel; Ciprocinonide; Flunisolide; Metformin and related; Sertraline; Sulindac; and Terazosin  Medications: Prior to Admission medications   Medication Sig Start Date End Date Taking? Authorizing Provider  acetaminophen (TYLENOL) 325 MG tablet Take 2 tablets (650 mg total) by mouth every 6 (six) hours as needed for mild pain (or Fever >/= 101). 08/09/15   Silver Huguenin Elgergawy, MD  acidophilus (RISAQUAD) CAPS capsule Take 2 capsules by mouth daily. 08/09/15   Silver Huguenin Elgergawy, MD  albuterol (PROVENTIL HFA;VENTOLIN HFA) 108 (90 BASE) MCG/ACT inhaler Inhale 2 puffs into the lungs every 6 (six) hours as needed for wheezing or shortness of breath. 07/16/13   Wardell Honour, MD  allopurinol (ZYLOPRIM) 300 MG tablet Take 300 mg by mouth daily with lunch.    Historical Provider, MD  amLODipine (NORVASC) 10 MG tablet Take 10 mg by mouth daily with supper.     Historical Provider, MD  aspirin  EC 81 MG tablet Take 81 mg by mouth daily with lunch.    Historical Provider, MD  Brinzolamide-Brimonidine [facility]) 1-0.2 % SUSP Place 1 drop into both eyes 2 (two) times daily.     Historical Provider, MD  budesonide-formoterol (SYMBICORT) 160-4.5 MCG/ACT inhaler Inhale 2 puffs into the lungs 2 (two) times daily.    Historical Provider, MD  calcitRIOL (ROCALTROL) 0.25 MCG capsule Take 0.25 mcg by mouth every Monday, Wednesday, and Friday.    Historical Provider, MD  cyclobenzaprine (FLEXERIL) 10 MG tablet  Take 1 tablet (10 mg total) by mouth 3 (three) times daily as needed for muscle spasms. 08/09/15   Ashok Pall, MD  cycloSPORINE (RESTASIS) 0.05 % ophthalmic emulsion Place 1 drop into both eyes 2 (two) times daily.    Historical Provider, MD  feeding supplement, GLUCERNA SHAKE, (GLUCERNA SHAKE) LIQD Take 237 mLs by mouth 3 (three) times daily between meals. 08/09/15   Silver Huguenin Elgergawy, MD  finasteride (PROSCAR) 5 MG tablet Take 5 mg by mouth at bedtime.     Historical Provider, MD  gabapentin (NEURONTIN) 300 MG capsule Take 1 capsule (300 mg total) by mouth 3 (three) times daily. PATIENT NEEDS OFFICE VISIT FOR ADDITIONAL REFILLS 01/23/13   Wendie Agreste, MD  insulin aspart protamine- aspart (NOVOLOG MIX 70/30) (70-30) 100 UNIT/ML injection Inject 40 Units into the skin 2 (two) times daily with a meal. Take a.m. & with supper    Historical Provider, MD  ipratropium-albuterol (DUONEB) 0.5-2.5 (3) MG/3ML SOLN Take 3 mLs by nebulization every 8 (eight) hours. Reported on 03/27/2015    Historical Provider, MD  Liraglutide (VICTOZA) 18 MG/3ML SOPN Inject 1.2 mg into the skin daily at 3 pm.     Historical Provider, MD  metoprolol (LOPRESSOR) 50 MG tablet Take 1 tablet (50 mg total) by mouth 2 (two) times daily. 08/09/15   Silver Huguenin Elgergawy, MD  montelukast (SINGULAIR) 10 MG tablet Take 1 tablet (10 mg total) by mouth at bedtime. 07/27/13   Wardell Honour, MD  Multiple Vitamins-Minerals (PRESERVISION AREDS 2 PO) Take 1 tablet by mouth 2 (two) times daily.     Historical Provider, MD  oxyCODONE (OXY IR/ROXICODONE) 5 MG immediate release tablet Take 1 tablet (5 mg total) by mouth every 4 (four) hours as needed for severe pain. 08/09/15   Silver Huguenin Elgergawy, MD  pilocarpine (PILOCAR) 2 % ophthalmic solution Place 1 drop into both eyes 3 (three) times daily.    Historical Provider, MD  polyethylene glycol (MIRALAX / GLYCOLAX) packet Take 17 g by mouth 2 (two) times daily.    Historical Provider, MD  potassium  chloride SA (K-DUR,KLOR-CON) 20 MEQ tablet Take 20 mEq by mouth daily.    Historical Provider, MD  predniSONE (DELTASONE) 1 MG tablet Take 2 mg by mouth daily with breakfast. IN CONJUNCTION WITH ONE 5 MG TABLET TO EQUAL A TOTAL OF 7 MILLIGRAMS    Historical Provider, MD  predniSONE (DELTASONE) 5 MG tablet Take 5 mg by mouth daily with breakfast. IN CONJUNCTION WITH TWO 1 MG TABLETS TO EQUAL A TOTAL OF 7 MILLIGRAMS    Historical Provider, MD  sennosides-docusate sodium (SENOKOT-S) 8.6-50 MG tablet Take 2 tablets by mouth 2 (two) times daily.    Historical Provider, MD  simvastatin (ZOCOR) 40 MG tablet Take 40 mg by mouth daily.    Historical Provider, MD  sodium chloride 0.9 % nebulizer solution Take 3 mLs by nebulization every 6 (six) hours as needed for wheezing.  Historical Provider, MD  Tamsulosin HCl (FLOMAX) 0.4 MG CAPS Take 0.4 mg by mouth at bedtime.     Historical Provider, MD  tiZANidine (ZANAFLEX) 4 MG capsule Take 4 mg by mouth 3 (three) times daily as needed for muscle spasms.    Historical Provider, MD     Family History  Problem Relation Age of Onset  . Hypertension Other     Social History   Social History  . Marital Status: Married    Spouse Name: N/A  . Number of Children: N/A  . Years of Education: N/A   Occupational History  . Driver    Social History Main Topics  . Smoking status: Former Smoker    Quit date: 03/16/1957  . Smokeless tobacco: Never Used  . Alcohol Use: No  . Drug Use: No  . Sexual Activity: Yes    Birth Control/ Protection: None   Other Topics Concern  . None   Social History Narrative   Marital status: Married x 55 years.      Children: 3 children; 5 grandchildren; 6 gg.      Lives: with wife.        Employment: retired; [employer].  Working every other week; 45 hours.      Tobacco:  Quit at age 54.      Alcohol:  None      Exercise: none      ADLs: indepedent with ADLs;  Uses cane and has a walker.  Drives.       Advanced Directives:  +Living Will.  DNR/DNI.  Wife is HCPOA.    Review of Systems: A 12 point ROS discussed and pertinent positives are indicated in the HPI above.  All other systems are negative.  Review of Systems  Constitutional: Positive for activity change and appetite change. Negative for fever.  Musculoskeletal: Positive for back pain and gait problem.  Neurological: Positive for weakness.  Psychiatric/Behavioral: Positive for confusion. Negative for behavioral problems.    Vital Signs: BP 141/70 mmHg  Pulse 84  Temp(Src) 97.3 F (36.3 C) (Oral)  Resp 18  Ht 5\' 11"  (1.803 m)  Wt 215 lb 9.8 oz (97.8 kg)  BMI 30.08 kg/m2  SpO2 96%  Physical Exam  Cardiovascular: Normal rate, regular rhythm and normal heart sounds.   Pulmonary/Chest: Effort normal and breath sounds normal. He has no wheezes.  Abdominal: Soft. Bowel sounds are normal.  Musculoskeletal: Normal range of motion.  Neurological: He is alert.  Skin: Skin is warm and dry.  Psychiatric: He has a normal mood and affect.  Some confusion Consented with wife and daughter via phone  Nursing note and vitals reviewed.   Mallampati Score:  MD Evaluation Airway: WNL Heart: WNL Abdomen: WNL Chest/ Lungs: WNL ASA  Classification: 3 Mallampati/Airway Score: One  Imaging: Dg Chest 2 View  08/05/2015  CLINICAL DATA:  Community acquired pneumonia EXAM: CHEST  2 VIEW COMPARISON:  08/04/2015 FINDINGS: Cardiomegaly again noted. Right IJ central line is unchanged in position. Status post median sternotomy. There is patchy residual right perihilar asymmetric edema or infiltrate. Bony thorax is stable. Degenerative changes mid and lower thoracic spine. IMPRESSION: Right perihilar residual patchy asymmetric edema or infiltrate. No pulmonary edema. Status post CABG. Stable right IJ central line position PICC Electronically Signed   By: Lahoma Crocker M.D.   On: 08/05/2015 13:15   Ct Head Wo Contrast  08/03/2015  CLINICAL DATA:   Is altered mental status.  Recent back surgery. EXAM: CT  HEAD WITHOUT CONTRAST TECHNIQUE: Contiguous axial images were obtained from the base of the skull through the vertex without intravenous contrast. COMPARISON:  None. FINDINGS: Ventricles are normal in size, for this patient's age, and normal in configuration. There are no parenchymal masses or mass effect. There is no evidence of a cortical infarct. Mild periventricular white matter hypoattenuation is noted consistent with chronic microvascular ischemic change. Small focal hypoattenuation area in the left base a ganglia suggests old lacune infarct. There are no extra-axial masses or abnormal fluid collections. There is no intracranial hemorrhage. Visualized sinuses and mastoid air cells are clear. IMPRESSION: 1. No acute intracranial abnormalities. 2. Age related volume loss. Mild chronic microvascular ischemic change. Electronically Signed   By: Lajean Manes M.D.   On: 08/03/2015 15:16   Mr Brain Wo Contrast  08/29/2015  CLINICAL DATA:  Altered mental status. Patient threatening himself and others. Lethargy. Evaluation for stroke. EXAM: MRI HEAD WITHOUT CONTRAST TECHNIQUE: Multiplanar, multiecho pulse sequences of the brain and surrounding structures were obtained without intravenous contrast. COMPARISON:  08/03/2015 head CT FINDINGS: The study is mildly motion degraded. There is no evidence of acute infarct, intracranial hemorrhage, mass, midline shift, or extra-axial fluid collection. Cerebral atrophy is within normal limits for age. Patchy cerebral white matter T2 hyperintensities are nonspecific but compatible with mild-to-moderate chronic small vessel ischemic disease. Prior bilateral cataract extraction is noted. Paranasal sinuses and mastoid air cells are clear. Major intracranial vascular flow voids are preserved. IMPRESSION: 1. No acute intracranial abnormality. 2. Mild-to-moderate chronic small vessel ischemic disease. Electronically Signed    By: Logan Bores M.D.   On: 08/29/2015 13:03   Mr Lumbar Spine W Wo Contrast  08/30/2015  ADDENDUM REPORT: 08/30/2015 09:27 ADDENDUM: Study discussed by telephone with Imelda Pillow, Medical Assitant for Dr. Ashok Pall on 08/30/2015 at 0912 hours. Electronically Signed   By: Genevie Ann M.D.   On: 08/30/2015 09:27  08/30/2015  CLINICAL DATA:  75 year old male with lumbar back pain radiating to the left lower extremity status post lumbar spine surgery 07/30/2015. Subsequent encounter. EXAM: MRI LUMBAR SPINE WITHOUT AND WITH CONTRAST TECHNIQUE: Multiplanar and multiecho pulse sequences of the lumbar spine were obtained without and with intravenous contrast. CONTRAST:  9mL MULTIHANCE GADOBENATE DIMEGLUMINE 529 MG/ML IV SOLN COMPARISON:  Outside preoperative lumbar MRI 07/24/2015. Intraoperative radiographs 07/30/2015. FINDINGS: Segmentation:  Normal as seen on the intraoperative radiographs. Alignment: Interval mild L4 superior endplate deformity with mild (5-10%) loss of L4 vertebral body height. Associated mild endplate edema there are and at the adjacent L3 inferior endplate. Postoperative changes at this level further described below. Stable vertebral height and alignment elsewhere. Vertebrae: Endplate marrow edema at L3 and L4 as stated. Stable and normal bone marrow signal elsewhere. Conus medullaris: Extends to the L1-L2 level and appears normal. No lower thoracic spinal cord signal abnormality. Paraspinal and other soft tissues: Postoperative changes in the posterior paraspinal soft tissues from the L2 spinous process level to the L4 spinous process level. Medial bilateral erector spinae muscle edema and enhancement. Enhancement tracks from the skin surface to the epidural space at L3-L4. There is a a small 15 mm fluid collection overlying the L3 spinous process. See also L3-L4 disc space findings below. No definite prevertebral soft tissue inflammation. There is mild right greater than left medial psoas  muscle inflammation and enhancement (series 6, image 1). Disc levels: The disc space levels L1-L2 and above are stable. The L4-L5 and L5-S1 levels are stable. L2-L3:  Stable. L3-L4: Large cephalad  disc extrusion with disc fragment encompassing 7 x 11 x 24 mm (AP by transverse by CC). See series 9, image 12 and series 4, image 8. The L3-L4 disc space now appears to be fluid-filled. The endplates are mildly edematous and enhancing. Moderate facet and ligament flavum hypertrophy Re demonstrated. Postoperative changes possibly to the ligament flavum. Subsequent severe spinal stenosis (series 9, image 14). IMPRESSION: 1. Postoperative changes at the L3-L4 level with superimposed large cephalad disc extrusion (series 4, image 8) causing severe spinal stenosis, as well as abnormal fluid signal in the L3-L4 disc space associated with mild L4 superior endplate fracture with L3 and L4 endplate edema/inflammation. Mild associated psoas muscle inflammation. 2. The appearance is such that discitis osteomyelitis at L3-L4 cannot be excluded. 3. There is also a small postoperative fluid collection overlying the L3 spinous process, favor seroma. Electronically Signed: By: Genevie Ann M.D. On: 08/29/2015 13:39   Dg Chest Port 1 View  08/04/2015  CLINICAL DATA:  Shortness of breath EXAM: PORTABLE CHEST 1 VIEW COMPARISON:  Chest radiograph from one day prior. FINDINGS: Right internal jugular central venous catheter terminates in the upper third of the superior vena cava. Sternotomy wires appear aligned and intact. CABG clips overlie the mediastinum. Stable cardiomediastinal silhouette with mild cardiomegaly. No pneumothorax. No pleural effusion. Calcified right basilar granuloma is stable. Mild residual pulmonary edema, nearly resolved. Mild bibasilar atelectasis. IMPRESSION: 1. Stable mild cardiomegaly with nearly resolved mild residual pulmonary edema. 2. Mild bibasilar atelectasis. Electronically Signed   By: Ilona Sorrel M.D.   On:  08/04/2015 11:53   Dg Chest Port 1 View  08/03/2015  CLINICAL DATA:  Productive cough EXAM: PORTABLE CHEST 1 VIEW COMPARISON:  07/30/2015 FINDINGS: Cardiomegaly with pulmonary vascular congestion. Patchy opacities in the right upper lobe, left lower lobe, and right infrahilar region. Differential considerations include multifocal pneumonia versus asymmetric interstitial edema. No definite pleural effusions.  No pneumothorax. Postsurgical changes related to prior CABG. Right IJ venous catheter terminates in the mid SVC. Median sternotomy. IMPRESSION: Cardiomegaly with pulmonary vascular congestion. Multifocal patchy opacities, suspicious for multifocal pneumonia, less likely asymmetric interstitial edema. Electronically Signed   By: Julian Hy M.D.   On: 08/03/2015 11:13    Labs:  CBC:  Recent Labs  08/20/15 08/28/15 2312 08/29/15 1034 08/30/15 0310  WBC 7.8 6.4 6.3 6.0  HGB 12.1* 11.8* 11.3* 10.9*  HCT 39* 37.5* 36.2* 34.8*  PLT 322 254 250 248    COAGS:  Recent Labs  07/25/15 0005  INR 1.06  APTT 31    BMP:  Recent Labs  08/07/15 0604  08/20/15 08/28/15 2312 08/29/15 1034 08/30/15 0310  NA 140  < > 143 138 140 140  K 3.5  < > 4.2 3.5 3.5 3.5  CL 101  --   --  103 106 106  CO2 30  --   --  24 25 26   GLUCOSE 91  --   --  237* 184* 228*  BUN 27*  < > 19 13 12 16   CALCIUM 8.9  --   --  9.2 9.0 9.1  CREATININE 1.56*  < > 1.6* 1.61* 1.44* 1.35*  GFRNONAA 42*  --   --  40* 46* 50*  GFRAA 48*  --   --  47* 53* 58*  < > = values in this interval not displayed.  LIVER FUNCTION TESTS:  Recent Labs  08/04/15 0635 08/28/15 2312 08/29/15 1034 08/30/15 0310  BILITOT 1.6* 1.4* 1.2 0.8  AST 18  39 31 25  ALT 21 72* 61 53  ALKPHOS 71 461* 407* 384*  PROT 5.0* 6.3* 5.9* 5.8*  ALBUMIN 1.9* 2.8* 2.6* 2.4*    TUMOR MARKERS: No results for input(s): AFPTM, CEA, CA199, CHROMGRNA in the last 8760 hours.  Assessment and Plan:  Back pain AMS Hx L3-4 discectomy  07/30/15 MR reveals recurrent discitis Scheduled for disc aspiration in IR Risks and Benefits discussed with the patient including, but not limited to bleeding, infection, damage to adjacent structures or low yield requiring additional tests. All of the patient's questions were answered, patient is agreeable to proceed. Consent signed and in chart.   Thank you for this interesting consult.  I greatly enjoyed meeting VERNA EANS and look forward to participating in their care.  A copy of this report was sent to the requesting provider on this date.  Electronically Signed: Monia Sabal A 08/30/2015, 1:25 PM   I spent a total of 40 Minutes    in face to face in clinical consultation, greater than 50% of which was counseling/coordinating care for L3-4 disc aspiration

## 2015-08-30 NOTE — Consult Note (Signed)
[facility]         Reason for Consult: Discitis Osteomyelitis    Referring Physician: [facility]  Active Problems:   CAD (coronary artery disease)   Diabetes mellitus, type 2 ([place])   Neuropathy ([place])   Rheumatoid arthritis ([place])   CHF with left ventricular diastolic dysfunction, NYHA class 2 (HCC)   OSA (obstructive sleep apnea)   Altered mental status   History of COPD   Spinal stenosis of lumbar region with radiculopathy   Renal cancer (HCC)   Acute encephalopathy   Elevated troponin   Elevated LFTs   CAD in native artery   Uncontrolled type 2 diabetes mellitus with complication (HCC)   Rheumatoid arthritis involving vertebra with positive rheumatoid factor ([place])   Spinal stenosis of lumbar region  HPI: Jacob [name] is a 75 y.o. male with PMHx of herniated lumbar disc at L3-L4 s/p lumbar laminectomy and discectomy at L3-L4 on 07/30/15, T2DM, COPd, OSA, h/o rectal cancer, RA, PMR on chronic prednisone and CAD who presented to the ED on 6/14 with worsening encephalopathy and aggression. Given back pain and recent surgery, MRI of the lumbar spine was obtained which showed an abnormal fluid signal in the L3-L4 disc space concerning for discitis osteomyelitis at L3-L4. Patient has had low grade temperatures since admission 99-100, but has been without leukocytosis. CRP elevated at 17.1. Patient was evaluated by neurosurgery who was concerned for discitis and recommended empiric antibiotics and CT guided disc aspiration by IR. ID was consulted for further recommendations.   Patient was seen and examined this afternoon. Patient states he feels well overall. He admits to intermittent nausea and vomiting, but denies fever, chills, abdominal pain, back pain or diarrhea.   Past Medical History  Diagnosis Date  . Allergy   . Asthma   . Diabetes mellitus without complication ([place])   . Cataract   . Arthritis   . Polymyalgia rheumatica (HCC)    maintained on Prednisone, Plaquenil. Followed by rhuematology every 4 months/[name].  Marland Kitchen COPD (chronic obstructive pulmonary disease) ([place])   . Anemia   . Hypertension   . BPH (benign prostatic hyperplasia)   . Sleep apnea     CPAP   Trying to use  . Shortness of breath dyspnea     with exertion  . Anxiety   . Chronic kidney disease     chronic  kidney failure  kidney function at 42%  . Coronary artery disease     CABG  7 bypasses  . Hepatitis     many years ago  . Cancer of kidney ([place])     Allergies:  Allergies  Allergen Reactions  . Ace Inhibitors Other (See Comments)    Probably nausea and vomiting per patient   . Actonel [Risedronate] Nausea And Vomiting  . Ciprocinonide [Fluocinolone] Other (See Comments)    Probably nausea and vomiting per patient  . Flunisolide Other (See Comments)    Probably nausea and vomiting per patient   . Metformin And Related Other (See Comments)    Probably nausea and vomiting per patient   . Sertraline Other (See Comments)    Probably nausea and vomiting per patient   . Sulindac Other (See Comments)    Probably nausea and vomiting per patient   . Terazosin Other (See Comments)    Probably nausea and vomiting per patient     Current antibiotics: None  Cultures: BCx 6/16 pending Body Fluid Cx 6/16 pending  MEDICATIONS: . allopurinol  300 mg Oral Q lunch  . amLODipine  10 mg Oral Q supper  . antiseptic oral rinse  7 mL Mouth Rinse BID  . aspirin EC  81 mg Oral Q lunch  . atorvastatin  20 mg Oral q1800  . bupivacaine (PF)      . calcitRIOL  0.25 mcg Oral Q M,W,F  . cycloSPORINE  1 drop Both Eyes BID  . feeding supplement (GLUCERNA SHAKE)  237 mL Oral TID BM  . fentaNYL      . finasteride  5 mg Oral QHS  . insulin aspart  0-20 Units Subcutaneous Q4H  . insulin glargine  15 Units Subcutaneous QHS  . ipratropium-albuterol  3 mL Nebulization TID  . Liraglutide  1.2 mg Subcutaneous Q1500  . methylPREDNISolone (SOLU-MEDROL)  injection  10 mg Intravenous Q breakfast  . metoprolol  50 mg Oral BID  . midazolam      . mometasone-formoterol  2 puff Inhalation BID  . montelukast  10 mg Oral QHS  . ondansetron      . pilocarpine  1 drop Both Eyes TID  . polyethylene glycol  17 g Oral BID  . predniSONE  7 mg Oral Q breakfast  . senna-docusate  2 tablet Oral BID  . sodium chloride flush  3 mL Intravenous Q12H  . tamsulosin  0.4 mg Oral QHS    Social History  Substance Use Topics  . Smoking status: Former Smoker    Quit date: 03/16/1957  . Smokeless tobacco: Never Used  . Alcohol Use: No    Family History  Problem Relation Age of Onset  . Hypertension Other    Review of Systems: General: Denies fever, chills, fatigue, change in appetite and diaphoresis.  Respiratory: Denies SOB, cough.   Cardiovascular: Denies chest pain and palpitations.  Gastrointestinal: Admits to intermittent nausea, vomiting. Denies abdominal pain, diarrhea Genitourinary: Denies dysuria, urgency, frequency Musculoskeletal: Denies myalgias, back pain.  Skin: Denies rash and wounds.  Neurological: Admits to weakness in bilateral lower extremities L>R. Denies dizziness, headaches, lightheadedness, numbness Psychiatric/Behavioral: Denies mood changes, confusion, nervousness, sleep disturbance and agitation.  OBJECTIVE: Filed Vitals:   08/30/15 1452 08/30/15 1456 08/30/15 1500 08/30/15 1504  BP: 153/80 141/83  143/81  Pulse: 89 86 91 90  Temp:      TempSrc:      Resp: 15 14 18 20   Height:      Weight:      SpO2: 100% 100% 99% 100%   General: Vital signs reviewed.  Patient is elderly, in no acute distress and cooperative with exam.  Head: Normocephalic and atraumatic. Eyes: Conjunctivae normal, no scleral icterus.  Neck: Supple, trachea midline.  Cardiovascular: RRR, S1 normal, S2 normal. Pulmonary/Chest: Inspiratory crackles in left lower lung field, no wheezes, or rhonchi. Abdominal: Soft, non-tender, non-distended, BS  + Extremities: No lower extremity edema bilaterally Neurological: A&O x3, unable to answer all questions appropriately, slow, wandering speech Skin: Warm, dry and intact. No rashes or erythema. Psychiatric: Intermittently tearful on exam. Cognition and memory are abnormal.    LABS: Results for orders placed or performed during the hospital encounter of 08/28/15 (from the past 48 hour(s))  Glucose, capillary     Status: Abnormal   Collection Time: 08/28/15 10:41 PM  Result Value Ref Range   Glucose-Capillary 233 (H) 65 - 99 mg/dL   Comment 1 Capillary Specimen   CBC with Differential/Platelet     Status: Abnormal   Collection Time: 08/28/15 11:12 PM  Result Value Ref Range   WBC 6.4 4.0 - 10.5 K/uL   RBC 3.94 (L) 4.22 - 5.81 MIL/uL   Hemoglobin 11.8 (L) 13.0 - 17.0 g/dL   HCT 37.5 (L) 39.0 - 52.0 %   MCV 95.2 78.0 - 100.0 fL   MCH 29.9 26.0 - 34.0 pg   MCHC 31.5 30.0 - 36.0 g/dL   RDW 15.0 11.5 - 15.5 %   Platelets 254 150 - 400 K/uL   Neutrophils Relative % 59 %   Lymphocytes Relative 21 %   Monocytes Relative 11 %   Eosinophils Relative 8 %   Basophils Relative 1 %   Neutro Abs 3.8 1.7 - 7.7 K/uL   Lymphs Abs 1.3 0.7 - 4.0 K/uL   Monocytes Absolute 0.7 0.1 - 1.0 K/uL   Eosinophils Absolute 0.5 0.0 - 0.7 K/uL   Basophils Absolute 0.1 0.0 - 0.1 K/uL   WBC Morphology ATYPICAL LYMPHOCYTES   Comprehensive metabolic panel     Status: Abnormal   Collection Time: 08/28/15 11:12 PM  Result Value Ref Range   Sodium 138 135 - 145 mmol/L   Potassium 3.5 3.5 - 5.1 mmol/L   Chloride 103 101 - 111 mmol/L   CO2 24 22 - 32 mmol/L   Glucose, Bld 237 (H) 65 - 99 mg/dL   BUN 13 6 - 20 mg/dL   Creatinine, Ser 1.61 (H) 0.61 - 1.24 mg/dL   Calcium 9.2 8.9 - 10.3 mg/dL   Total Protein 6.3 (L) 6.5 - 8.1 g/dL   Albumin 2.8 (L) 3.5 - 5.0 g/dL   AST 39 15 - 41 U/L   ALT 72 (H) 17 - 63 U/L   Alkaline Phosphatase 461 (H) 38 - 126 U/L   Total Bilirubin 1.4 (H) 0.3 - 1.2 mg/dL   GFR calc non  Af Amer 40 (L) >60 mL/min   GFR calc Af Amer 47 (L) >60 mL/min    Comment: (NOTE) The eGFR has been calculated using the CKD EPI equation. This calculation has not been validated in all clinical situations. eGFR's persistently <60 mL/min signify possible Chronic Kidney Disease.    Anion gap 11 5 - 15  Troponin I (q 6hr x 3)     Status: Abnormal   Collection Time: 08/28/15 11:12 PM  Result Value Ref Range   Troponin I 0.05 (H) <0.031 ng/mL    Comment:        PERSISTENTLY INCREASED TROPONIN VALUES IN THE RANGE OF 0.04-0.49 ng/mL CAN BE SEEN IN:       -UNSTABLE ANGINA       -CONGESTIVE HEART FAILURE       -MYOCARDITIS       -CHEST TRAUMA       -ARRYHTHMIAS       -LATE PRESENTING MYOCARDIAL INFARCTION       -COPD   CLINICAL FOLLOW-UP RECOMMENDED.   Ammonia     Status: None   Collection Time: 08/28/15 11:13 PM  Result Value Ref Range   Ammonia 28 9 - 35 umol/L  I-STAT 3, arterial blood gas (G3+)     Status: None   Collection Time: 08/28/15 11:14 PM  Result Value Ref Range   pH, Arterial 7.382 7.350 - 7.450   pCO2 arterial 39.2 35.0 - 45.0 mmHg   pO2, Arterial 93.0 80.0 - 100.0 mmHg   Bicarbonate 23.5 20.0 - 24.0 mEq/L   TCO2 25 0 - 100 mmol/L   O2 Saturation 97.0 %   Acid-base deficit 2.0 0.0 -  2.0 mmol/L   Patient temperature 97.3 F    Collection site RADIAL, ALLEN'S TEST ACCEPTABLE    Drawn by RT    Sample type ARTERIAL   Glucose, capillary     Status: Abnormal   Collection Time: 08/29/15  5:03 AM  Result Value Ref Range   Glucose-Capillary 203 (H) 65 - 99 mg/dL   Comment 1 Capillary Specimen   Troponin I (q 6hr x 3)     Status: Abnormal   Collection Time: 08/29/15 10:34 AM  Result Value Ref Range   Troponin I 0.04 (H) <0.031 ng/mL    Comment:        PERSISTENTLY INCREASED TROPONIN VALUES IN THE RANGE OF 0.04-0.49 ng/mL CAN BE SEEN IN:       -UNSTABLE ANGINA       -CONGESTIVE HEART FAILURE       -MYOCARDITIS       -CHEST TRAUMA       -ARRYHTHMIAS       -LATE  PRESENTING MYOCARDIAL INFARCTION       -COPD   CLINICAL FOLLOW-UP RECOMMENDED.   Troponin I (q 6hr x 3)     Status: Abnormal   Collection Time: 08/29/15 10:34 AM  Result Value Ref Range   Troponin I 0.04 (H) <0.031 ng/mL    Comment:        PERSISTENTLY INCREASED TROPONIN VALUES IN THE RANGE OF 0.04-0.49 ng/mL CAN BE SEEN IN:       -UNSTABLE ANGINA       -CONGESTIVE HEART FAILURE       -MYOCARDITIS       -CHEST TRAUMA       -ARRYHTHMIAS       -LATE PRESENTING MYOCARDIAL INFARCTION       -COPD   CLINICAL FOLLOW-UP RECOMMENDED.   Hemoglobin A1c     Status: Abnormal   Collection Time: 08/29/15 10:34 AM  Result Value Ref Range   Hgb A1c MFr Bld 6.8 (H) 4.8 - 5.6 %    Comment: (NOTE)         Pre-diabetes: 5.7 - 6.4         Diabetes: >6.4         Glycemic control for adults with diabetes: <7.0    Mean Plasma Glucose 148 mg/dL    Comment: (NOTE) Performed At: [facility] [place], [place] [id number] Lindon Romp MD TJ:[id number]   Magnesium     Status: Abnormal   Collection Time: 08/29/15 10:34 AM  Result Value Ref Range   Magnesium 1.6 (L) 1.7 - 2.4 mg/dL  Phosphorus     Status: None   Collection Time: 08/29/15 10:34 AM  Result Value Ref Range   Phosphorus 3.8 2.5 - 4.6 mg/dL  Comprehensive metabolic panel     Status: Abnormal   Collection Time: 08/29/15 10:34 AM  Result Value Ref Range   Sodium 140 135 - 145 mmol/L   Potassium 3.5 3.5 - 5.1 mmol/L   Chloride 106 101 - 111 mmol/L   CO2 25 22 - 32 mmol/L   Glucose, Bld 184 (H) 65 - 99 mg/dL   BUN 12 6 - 20 mg/dL   Creatinine, Ser 1.44 (H) 0.61 - 1.24 mg/dL   Calcium 9.0 8.9 - 10.3 mg/dL   Total Protein 5.9 (L) 6.5 - 8.1 g/dL   Albumin 2.6 (L) 3.5 - 5.0 g/dL   AST 31 15 - 41 U/L   ALT 61 17 - 63 U/L   Alkaline Phosphatase  407 (H) 38 - 126 U/L   Total Bilirubin 1.2 0.3 - 1.2 mg/dL   GFR calc non Af Amer 46 (L) >60 mL/min   GFR calc Af Amer 53 (L) >60 mL/min    Comment: (NOTE) The eGFR  has been calculated using the CKD EPI equation. This calculation has not been validated in all clinical situations. eGFR's persistently <60 mL/min signify possible Chronic Kidney Disease.    Anion gap 9 5 - 15  CBC     Status: Abnormal   Collection Time: 08/29/15 10:34 AM  Result Value Ref Range   WBC 6.3 4.0 - 10.5 K/uL   RBC 3.79 (L) 4.22 - 5.81 MIL/uL   Hemoglobin 11.3 (L) 13.0 - 17.0 g/dL   HCT 36.2 (L) 39.0 - 52.0 %   MCV 95.5 78.0 - 100.0 fL   MCH 29.8 26.0 - 34.0 pg   MCHC 31.2 30.0 - 36.0 g/dL   RDW 15.0 11.5 - 15.5 %   Platelets 250 150 - 400 K/uL  Hepatitis panel, acute     Status: None   Collection Time: 08/29/15 10:34 AM  Result Value Ref Range   Hepatitis B Surface Ag Negative Negative   HCV Ab <0.1 0.0 - 0.9 s/co ratio    Comment: (NOTE)                                  Negative:     < 0.8                             Indeterminate: 0.8 - 0.9                                  Positive:     > 0.9 The CDC recommends that a positive HCV antibody result be followed up with a HCV Nucleic Acid Amplification test ([id number]). Performed At: [facility] [place], [place] [id number] Lindon Romp MD SA:[id number]    Hep A IgM Negative Negative   Hep B C IgM Negative Negative  Prealbumin     Status: Abnormal   Collection Time: 08/29/15 10:34 AM  Result Value Ref Range   Prealbumin 11.3 (L) 18 - 38 mg/dL  Sedimentation rate     Status: Abnormal   Collection Time: 08/29/15 10:34 AM  Result Value Ref Range   Sed Rate 90 (H) 0 - 16 mm/hr  C-reactive protein     Status: Abnormal   Collection Time: 08/29/15 10:34 AM  Result Value Ref Range   CRP 17.1 (H) <1.0 mg/dL  TSH     Status: None   Collection Time: 08/29/15 10:34 AM  Result Value Ref Range   TSH 1.709 0.350 - 4.500 uIU/mL  Glucose, capillary     Status: Abnormal   Collection Time: 08/29/15  1:29 PM  Result Value Ref Range   Glucose-Capillary 223 (H) 65 - 99 mg/dL  MRSA PCR Screening      Status: None   Collection Time: 08/29/15  2:20 PM  Result Value Ref Range   MRSA by PCR NEGATIVE NEGATIVE    Comment:        The GeneXpert MRSA Assay (FDA approved for NASAL specimens only), is one component of a comprehensive MRSA colonization surveillance program. It is not intended to diagnose MRSA  infection nor to guide or monitor treatment for MRSA infections.   Glucose, capillary     Status: Abnormal   Collection Time: 08/29/15  4:28 PM  Result Value Ref Range   Glucose-Capillary 284 (H) 65 - 99 mg/dL   Comment 1 Notify RN   Glucose, capillary     Status: Abnormal   Collection Time: 08/29/15  7:47 PM  Result Value Ref Range   Glucose-Capillary 375 (H) 65 - 99 mg/dL  Glucose, capillary     Status: Abnormal   Collection Time: 08/29/15 11:21 PM  Result Value Ref Range   Glucose-Capillary 314 (H) 65 - 99 mg/dL   Comment 1 Capillary Specimen   Troponin I     Status: None   Collection Time: 08/30/15  3:10 AM  Result Value Ref Range   Troponin I 0.03 <0.031 ng/mL    Comment:        NO INDICATION OF MYOCARDIAL INJURY.   Comprehensive metabolic panel     Status: Abnormal   Collection Time: 08/30/15  3:10 AM  Result Value Ref Range   Sodium 140 135 - 145 mmol/L   Potassium 3.5 3.5 - 5.1 mmol/L   Chloride 106 101 - 111 mmol/L   CO2 26 22 - 32 mmol/L   Glucose, Bld 228 (H) 65 - 99 mg/dL   BUN 16 6 - 20 mg/dL   Creatinine, Ser 1.35 (H) 0.61 - 1.24 mg/dL   Calcium 9.1 8.9 - 10.3 mg/dL   Total Protein 5.8 (L) 6.5 - 8.1 g/dL   Albumin 2.4 (L) 3.5 - 5.0 g/dL   AST 25 15 - 41 U/L   ALT 53 17 - 63 U/L   Alkaline Phosphatase 384 (H) 38 - 126 U/L   Total Bilirubin 0.8 0.3 - 1.2 mg/dL   GFR calc non Af Amer 50 (L) >60 mL/min   GFR calc Af Amer 58 (L) >60 mL/min    Comment: (NOTE) The eGFR has been calculated using the CKD EPI equation. This calculation has not been validated in all clinical situations. eGFR's persistently <60 mL/min signify possible Chronic  Kidney Disease.    Anion gap 8 5 - 15  CBC     Status: Abnormal   Collection Time: 08/30/15  3:10 AM  Result Value Ref Range   WBC 6.0 4.0 - 10.5 K/uL   RBC 3.73 (L) 4.22 - 5.81 MIL/uL   Hemoglobin 10.9 (L) 13.0 - 17.0 g/dL   HCT 34.8 (L) 39.0 - 52.0 %   MCV 93.3 78.0 - 100.0 fL   MCH 29.2 26.0 - 34.0 pg   MCHC 31.3 30.0 - 36.0 g/dL   RDW 14.6 11.5 - 15.5 %   Platelets 248 150 - 400 K/uL  Magnesium     Status: None   Collection Time: 08/30/15  3:10 AM  Result Value Ref Range   Magnesium 1.7 1.7 - 2.4 mg/dL  Glucose, capillary     Status: Abnormal   Collection Time: 08/30/15  4:50 AM  Result Value Ref Range   Glucose-Capillary 209 (H) 65 - 99 mg/dL   Comment 1 Notify RN   Glucose, capillary     Status: Abnormal   Collection Time: 08/30/15  7:22 AM  Result Value Ref Range   Glucose-Capillary 187 (H) 65 - 99 mg/dL   Comment 1 Capillary Specimen   Glucose, capillary     Status: Abnormal   Collection Time: 08/30/15 11:38 AM  Result Value Ref Range   Glucose-Capillary 213 (H)  65 - 99 mg/dL   Comment 1 Capillary Specimen   Protime-INR     Status: None   Collection Time: 08/30/15  1:17 PM  Result Value Ref Range   Prothrombin Time 13.6 11.6 - 15.2 seconds   INR 1.02 0.00 - 1.49    MICRO:  IMAGING: Mr Brain Wo Contrast  08/29/2015  CLINICAL DATA:  Altered mental status. Patient threatening himself and others. Lethargy. Evaluation for stroke. EXAM: MRI HEAD WITHOUT CONTRAST TECHNIQUE: Multiplanar, multiecho pulse sequences of the brain and surrounding structures were obtained without intravenous contrast. COMPARISON:  08/03/2015 head CT FINDINGS: The study is mildly motion degraded. There is no evidence of acute infarct, intracranial hemorrhage, mass, midline shift, or extra-axial fluid collection. Cerebral atrophy is within normal limits for age. Patchy cerebral white matter T2 hyperintensities are nonspecific but compatible with mild-to-moderate chronic small vessel ischemic  disease. Prior bilateral cataract extraction is noted. Paranasal sinuses and mastoid air cells are clear. Major intracranial vascular flow voids are preserved. IMPRESSION: 1. No acute intracranial abnormality. 2. Mild-to-moderate chronic small vessel ischemic disease. Electronically Signed   By: Logan Bores M.D.   On: 08/29/2015 13:03   Mr Lumbar Spine W Wo Contrast  08/30/2015  ADDENDUM REPORT: 08/30/2015 09:27 ADDENDUM: Study discussed by telephone with Imelda Pillow, Medical Assitant for Dr. Ashok Pall on 08/30/2015 at 0912 hours. Electronically Signed   By: Genevie Ann M.D.   On: 08/30/2015 09:27  08/30/2015  CLINICAL DATA:  75 year old male with lumbar back pain radiating to the left lower extremity status post lumbar spine surgery 07/30/2015. Subsequent encounter. EXAM: MRI LUMBAR SPINE WITHOUT AND WITH CONTRAST TECHNIQUE: Multiplanar and multiecho pulse sequences of the lumbar spine were obtained without and with intravenous contrast. CONTRAST:  62m MULTIHANCE GADOBENATE DIMEGLUMINE 529 MG/ML IV SOLN COMPARISON:  Outside preoperative lumbar MRI 07/24/2015. Intraoperative radiographs 07/30/2015. FINDINGS: Segmentation:  Normal as seen on the intraoperative radiographs. Alignment: Interval mild L4 superior endplate deformity with mild (5-10%) loss of L4 vertebral body height. Associated mild endplate edema there are and at the adjacent L3 inferior endplate. Postoperative changes at this level further described below. Stable vertebral height and alignment elsewhere. Vertebrae: Endplate marrow edema at L3 and L4 as stated. Stable and normal bone marrow signal elsewhere. Conus medullaris: Extends to the L1-L2 level and appears normal. No lower thoracic spinal cord signal abnormality. Paraspinal and other soft tissues: Postoperative changes in the posterior paraspinal soft tissues from the L2 spinous process level to the L4 spinous process level. Medial bilateral erector spinae muscle edema and enhancement.  Enhancement tracks from the skin surface to the epidural space at L3-L4. There is a a small 15 mm fluid collection overlying the L3 spinous process. See also L3-L4 disc space findings below. No definite prevertebral soft tissue inflammation. There is mild right greater than left medial psoas muscle inflammation and enhancement (series 6, image 1). Disc levels: The disc space levels L1-L2 and above are stable. The L4-L5 and L5-S1 levels are stable. L2-L3:  Stable. L3-L4: Large cephalad disc extrusion with disc fragment encompassing 7 x 11 x 24 mm (AP by transverse by CC). See series 9, image 12 and series 4, image 8. The L3-L4 disc space now appears to be fluid-filled. The endplates are mildly edematous and enhancing. Moderate facet and ligament flavum hypertrophy Re demonstrated. Postoperative changes possibly to the ligament flavum. Subsequent severe spinal stenosis (series 9, image 14). IMPRESSION: 1. Postoperative changes at the L3-L4 level with superimposed large cephalad disc extrusion (series  4, image 8) causing severe spinal stenosis, as well as abnormal fluid signal in the L3-L4 disc space associated with mild L4 superior endplate fracture with L3 and L4 endplate edema/inflammation. Mild associated psoas muscle inflammation. 2. The appearance is such that discitis osteomyelitis at L3-L4 cannot be excluded. 3. There is also a small postoperative fluid collection overlying the L3 spinous process, favor seroma. Electronically Signed: By: Genevie Ann M.D. On: 08/29/2015 13:39    Assessment/Plan:   Jacob [name] is a 75 yo male with PMHx of a herniated lumbar disc at L3-L4 s/p lumbar laminectomy and discectomy at L3-L4 on 07/30/15, T2DM, RA, PMR on chronic prednisone admitted on 6/14 with encephalopathy and concern for discitis.  Post-Surgical Discitis: Patient underwent a lumbar laminectomy and discectomy at L3-L4 on 07/30/15 for herniated lumbar disc. Given back pain and recent surgery, MRI of the lumbar spine was  obtained which showed an abnormal fluid signal in the L3-L4 disc space concerning for discitis osteomyelitis at L3-L4. Patient has had low grade temperatures since admission 99-100, but has been without leukocytosis. CRP elevated at 17.1. Patient was evaluated by neurosurgery who was concerned for discitis. Patient was seen by IR who plans on CT guided disc aspiration today.  -CT guided disc aspiration and culture -Follow culture results  -Start empiric antibiotics after disc aspiration with vancomycin and ceftriaxone to cover staph, strep and gram-negative bacilli  -Narrow based on culture results -Will likely need 6 weeks of IV therapy  Controlled T2DM: Last HgbA1c 6.8 on 08/29/15.  -Per primary  PMR and RA: Patient is on chronic steroid therapy predisposing him to immunosuppression.   Martyn Malay, DO PGY-2 Internal Medicine Resident Pager # [phone] 08/30/2015 3:24 PM

## 2015-08-30 NOTE — Procedures (Signed)
S/P L3 L4 fluoro guided disc aspiration . Xpasses with retrieval of approx 1.5 ml of thick bloody fluid

## 2015-08-30 NOTE — Care Management Note (Signed)
Case Management Note  Patient Details  Name: Jacob [name] MRN: [name] Date of Birth: May 02, 1940  Subjective/Objective:                    Action/Plan: Patient was [facility] with [facility] after his release from SNF rehab. Mary with [facility] aware of his admission and will follow his progress toward discharge. CM continuing to follow.   Expected Discharge Date:                  Expected Discharge Plan:  [place]  In-House Referral:     Discharge planning Services  CM Consult  Post Acute Care Choice:    Choice offered to:     DME Arranged:    DME Agency:     HH Arranged:    [place] Agency:     Status of Service:  In process, will continue to follow  Medicare Important Message Given:  Yes Date Medicare IM Given:    Medicare IM give by:    Date Additional Medicare IM Given:    Additional Medicare Important Message give by:     If discussed at [place] of Stay Meetings, dates discussed:    Additional Comments:  Pollie Friar, RN 08/30/2015, 10:46 AM

## 2015-08-30 NOTE — Care Management Important Message (Signed)
Important Message  Patient Details  Name: JAEVIN CANAL MRN: [name] Date of Birth: March 15, 1941   Medicare Important Message Given:  Yes    Loann Quill 08/30/2015, 9:19 AM

## 2015-08-30 NOTE — Progress Notes (Signed)
Patient of Dr. Lacy Duverney s/p L3-4 discectomy on 07/30/15.  He has had recurrence of leg pain and altered mental status.  He had an MRI which shows recurrent, cranially extruded disc fragment at L3-4.  He also has fluid in the disc space.  He is moving his legs well.  Given the elevated ESR and CRP as well as the disc space fluid I suspect this may be discitis.  I had a discussion with the patient and his family and offered to re-operate to remove the extruded fragment and to culture the disc space.  They are not enthusiastic about this.  In the absence of being able to disprove discitis I think this warrants empiric antibiotic treatment.  I have spoken with Dr. Sherral Hammers and suggested ID consult.  The family seems to be agreeable to CT guided aspiration of disc space fluid for culture if ID thinks this is necessary.  Please feel free to call with questions.

## 2015-08-30 NOTE — Progress Notes (Signed)
Pharmacy Antibiotic Note  Jacob [name] is a 75 y.o. male admitted on 08/28/2015 with discitis.  Pharmacy has been consulted for Vancomycin and Ceftriaxone dosing.  Plan: Ceftriaxone 2 grams iv Q 24 hours Vancomycin 1 gram iv x 1 dose now then 750 mg iv Q 24 hours  Height: 5\' 11"  (180.3 cm) Weight: 215 lb 9.8 oz (97.8 kg) IBW/kg (Calculated) : 75.3  Temp (24hrs), Avg:97.8 F (36.6 C), Min:97.3 F (36.3 C), Max:98.7 F (37.1 C)   Recent Labs Lab 08/28/15 2312 08/29/15 1034 08/30/15 0310  WBC 6.4 6.3 6.0  CREATININE 1.61* 1.44* 1.35*    Estimated Creatinine Clearance: 56.4 mL/min (by C-G formula based on Cr of 1.35).    Allergies  Allergen Reactions  . Ace Inhibitors Other (See Comments)    Probably nausea and vomiting per patient   . Actonel [Risedronate] Nausea And Vomiting  . Ciprocinonide [Fluocinolone] Other (See Comments)    Probably nausea and vomiting per patient  . Flunisolide Other (See Comments)    Probably nausea and vomiting per patient   . Metformin And Related Other (See Comments)    Probably nausea and vomiting per patient   . Sertraline Other (See Comments)    Probably nausea and vomiting per patient   . Sulindac Other (See Comments)    Probably nausea and vomiting per patient   . Terazosin Other (See Comments)    Probably nausea and vomiting per patient      Thank you for allowing pharmacy to be a part of this patient's care. Anette Guarneri, PharmD [phone]  08/30/2015 3:36 PM

## 2015-08-30 NOTE — Sedation Documentation (Signed)
dsg to back intact, dry

## 2015-08-30 NOTE — Progress Notes (Signed)
PROGRESS NOTE    Jacob [name]  [id number] DOB: 25-Mar-1940 DOA: 08/28/2015 PCP: [name], MD   Brief Narrative:  75 y.o. WM PMHx Anxiety, Lumbar stenosis S/P laminectomy and Discectomy May 2017, COPD, RA, HTN, OSA, CAD native artery S/P CABG 7 vessel, DM Type 2 uncontrolled with complication, DM neuropathy, PMR, CKD, Cancer of kidney, Hepatitis   Presented with worsening encephalopathy for the past 24 hours has been doing worse overall. He was discharged to home on June 8th. On 10th of June he started to get progressively worse. He was seen in office by Dr. Christella Noa and was doing better. But started to threaten family he had psych eval at [facility] and was told to go to PCP. Today they went to ER at [facility] care. He was seen in [place] ED. patient had extensive workup chest x-ray was unremarkable UA was concentrated but no evidence of UTI. CT scan of the head was non-acute. U tox positive for benzos and opioids. Troponin was slightly elevated but no EKG changes suggestive of ischemia. They were planing to do MRI to see what is going on he was given something for anxiety and have been lethargic since. where he have gone imaging of his back which was inconclusive for infection versus surgical changes but one does not appear to be infected. He was noted to have elevated LFTs ultrasound was done showing no evidence of cholecystitis. Creatinine was found to be 1.3 which is above his baseline. Chest x-ray while in the facility was unremarkable. Patient was accepted in transfer to stepdown to [facility] Family reports that he has not been eating and drinking for past 3-4 days. He has not taken much of his home medications he did have a dose of Percocet this morning and Flexeril.   Regarding pertinent Chronic problems: In May patient had a fall in the bathtub MRI of the back show large herniated nucleus pulposus at L3-4 with severe lumbar stenosis. He has undergone lumbar laminectomy discectomy at  the level L3/4 by Dr. Christella Noa In May this was complicated by pneumonia acute renal failure she was discharged to nursing home [facility] place and 26th of May Blood pressure at baseline controlled with amlodipine and Lopressor While in the nursing home he finished Augmentin for his age Pneumonia She has history of polymyalgia rheumatica for which she takes prednisone 7 mg daily Last echogram done in May 2017 showing preserved EF no evidence of diastolic dysfunction   Assessment & Plan:   Active Problems:   CAD (coronary artery disease)   Diabetes mellitus, type 2 (HCC)   Neuropathy (HCC)   Rheumatoid arthritis ([place])   CHF with left ventricular diastolic dysfunction, NYHA class 2 (HCC)   OSA (obstructive sleep apnea)   Altered mental status   History of COPD   Spinal stenosis of lumbar region with radiculopathy   Renal cancer (HCC)   Acute encephalopathy   Elevated troponin   Elevated LFTs   CAD in native artery   Uncontrolled type 2 diabetes mellitus with complication (HCC)   Rheumatoid arthritis involving vertebra with positive rheumatoid factor ([place])   Spinal stenosis of lumbar region   Controlled diabetes mellitus type 2 with complications ([place])   Acute encephalopathy  - the setting of dehydration and polypharmacy.  -Ammonia level,ABG, brain MRI unremarkable -After holding narcotics, Zanaflex;,all sedating medication patient's mentation is clear.  -Normal saline 122ml/hr, still not consuming adequate PO fluids   CAD (coronary artery disease)native artery  -Metoprolol 50 mg BID -Labetalol IV  PRN   CHF with left ventricular diastolic dysfunction, NYHA class 2  - last echogram was done in May 2017 show no evidence of CHF. -repeat Echocardiogram; essentially normal see results below   -strict in and out since admission +3.2 L -Daily weight Filed Weights   08/28/15 2220 08/29/15 1621  Weight: 95.9 kg (211 lb 6.7 oz) 97.8 kg (215 lb 9.8 oz)   OSA (obstructive sleep apnea)   -patient states has CPAP machine at home but unable to use secondary to claustrophobia. -Titrate O2 to maintain SPO2> 93%   Rheumatoid arthritis  -prednisone 7 mg daily  Spinal stenosis of lumbar region with radiculopathy status post laminectomy  -questionable changes on MRI. Infection vs expected post surgical changes? Will await neurosurgery input. -patient stated had extreme pain at home currently asymptomatic -6/16 spoke with Dr[facility] neurosurgery. After reviewing case he also concerned that could be early infection. He Spoke with family offered to perform surgery to ensure no infection, however patient and family declined. Did agree if necessary would allow needle aspiration of fluid. -Requested IR aspirate fluid for culture -spoke with Dr. Carlyle Basques ID concurs with plan recommend starting patient on ceftriaxone and vancomycin once aspirate obtained  Renal cancer  -S/P Ablation, stable   Elevated troponin  -asymptomatic most likely secondary to demand ischemia.   Diabetes mellitus type 2 controlled with complications -[id number] hemoglobin A1c= 6.8 -Increase Lantus15 units daily -Increase Resistant SSI  Elevated LFTs - outside hospital. Obtained ultrasound that showed no evidence of gallstone disease or cholecystitis possible dehydration related  Pain management  -OxyIR 5 mg QID PRN -Flexeril 5 mg TID PRN     DVT prophylaxis: SCD Code Status: Full Family Communication; none Disposition Plan: resolution of acute encephalopathy//increased  L-spine pain    Consultants:  Dr. Kevan Ny Ditty Neurosurgery   Dr. Carlyle Basques ID   Procedures/Significant Events:  6/15 MRI brain;negative acute CVA-mild to moderate  Chronic small vessel ischemic disease 6/15 L spine W/WO contrast;-Postoperative changes at the L3-L4 level with superimposed large cephalad disc extrusion causing severe spinal stenosis, as well as abnormal fluid signal in the L3-L4 disc  space associated with mild L4 superior endplate fracture with L3 and L4 endplate edema/inflammation.  -Mild associated psoas muscle inflammation. -Discitis/ osteomyelitis at L3-L4?? -Small postoperative fluid collection overlying the L3 spinous process, favor seroma. 6/15 echocardiogram;Left ventricle: mildly dilated.-LVEF= 55% - 60%.- (grade 1 diastolic dysfunction).   Cultures 6/16 blood pending  6/16 L-spine aspirated fluid pending    Antimicrobials: Ceftriaxone 6/16>> Vancomycin 6/16>>   Devices    LINES / TUBES:  NA    Continuous Infusions: . sodium chloride 100 mL/hr at 08/30/15 0700  . sodium chloride 50 mL/hr at 08/30/15 1530     Subjective: 6/16 A/O 4, Patient was able to confirm that he and his family spoke with neurosurgery this a.m. and was offered surgery, but declined. Currently rates his pain at 5/10, which is improved from admission.      Objective: Filed Vitals:   08/30/15 1600 08/30/15 1615 08/30/15 1630 08/30/15 1700  BP: 141/71 142/72 143/65 148/74  Pulse: 90 86 92 91  Temp:      TempSrc:      Resp: 18 17 16 15   Height:      Weight:      SpO2: 100% 100% 100% 100%    Intake/Output Summary (Last 24 hours) at 08/30/15 1816 Last data filed at 08/30/15 1800  Gross per 24 hour  Intake [id number]  ml  Output    800 ml  Net 2768.33 ml   Filed Weights   08/28/15 2220 08/29/15 1621  Weight: 95.9 kg (211 lb 6.7 oz) 97.8 kg (215 lb 9.8 oz)    Examination:  General:A/O 4, follows all commands, No acute respiratory distress Eyes: negative scleral hemorrhage, negative anisocoria, negative icterus ENT: Negative Runny nose, negative gingival bleeding, Neck:  Negative scars, masses, torticollis, lymphadenopathy, JVD Lungs: Clear to auscultation bilaterally without wheezes or crackles Cardiovascular: Regular rate and rhythm without murmur gallop or rub normal S1 and S2 Abdomen: negative abdominal pain, nondistended, positive soft, bowel sounds,  no rebound, no ascites, no appreciable mass Extremities: No significant cyanosis, clubbing, or edema bilateral lower extremities Skin: well-healed incision medial aspect of LLE Psychiatric:  Negative depression, negative anxiety, negative fatigue, negative mania  Central nervous system:  Cranial nerves II through XII intact, tongue/uvula midline, all extremities muscle strength 5/5, sensation intact throughout,negative dysarthria, negative expressive aphasia, negative receptive aphasia.      Data Reviewed: Care during the described time interval was provided by me .  I have reviewed this patient's available data, including medical history, events of note, physical examination, and all test results as part of my evaluation. I have personally reviewed and interpreted all radiology studies.  CBC:  Recent Labs Lab 08/28/15 2312 08/29/15 1034 08/30/15 0310  WBC 6.4 6.3 6.0  NEUTROABS 3.8  --   --   HGB 11.8* 11.3* 10.9*  HCT 37.5* 36.2* 34.8*  MCV 95.2 95.5 93.3  PLT 254 250 [id number]   Basic Metabolic Panel:  Recent Labs Lab 08/28/15 2312 08/29/15 1034 08/30/15 0310  NA 138 140 140  K 3.5 3.5 3.5  CL 103 106 106  CO2 24 25 26   GLUCOSE 237* 184* 228*  BUN 13 12 16   CREATININE 1.61* 1.44* 1.35*  CALCIUM 9.2 9.0 9.1  MG  --  1.6* 1.7  PHOS  --  3.8  --    GFR: Estimated Creatinine Clearance: 56.4 mL/min (by C-G formula based on Cr of 1.35). Liver Function Tests:  Recent Labs Lab 08/28/15 2312 08/29/15 1034 08/30/15 0310  AST 39 31 25  ALT 72* 61 53  ALKPHOS 461* 407* 384*  BILITOT 1.4* 1.2 0.8  PROT 6.3* 5.9* 5.8*  ALBUMIN 2.8* 2.6* 2.4*   No results for input(s): LIPASE, AMYLASE in the last 168 hours.  Recent Labs Lab 08/28/15 2313  AMMONIA 28   Coagulation Profile:  Recent Labs Lab 08/30/15 1317  INR 1.02   Cardiac Enzymes:  Recent Labs Lab 08/28/15 2312 08/29/15 1034 08/30/15 0310  TROPONINI 0.05* 0.04*  0.04* 0.03   BNP (last 3 results) No  results for input(s): PROBNP in the last 8760 hours. HbA1C:  Recent Labs  08/29/15 1034  HGBA1C 6.8*   CBG:  Recent Labs Lab 08/29/15 2321 08/30/15 0450 08/30/15 0722 08/30/15 1138 08/30/15 1541  GLUCAP 314* 209* 187* 213* 194*   Lipid Profile: No results for input(s): CHOL, HDL, LDLCALC, TRIG, CHOLHDL, LDLDIRECT in the last 72 hours. Thyroid Function Tests:  Recent Labs  08/29/15 1034  TSH 1.709   Anemia Panel: No results for input(s): VITAMINB12, FOLATE, FERRITIN, TIBC, IRON, RETICCTPCT in the last 72 hours. Urine analysis:    Component Value Date/Time   BILIRUBINUR neg 09/09/2013 1233   PROTEINUR >=300 09/09/2013 1233   UROBILINOGEN 0.2 09/09/2013 1233   NITRITE neg 09/09/2013 1233   LEUKOCYTESUR Negative 09/09/2013 1233   Sepsis Labs: @LABRCNTIP (procalcitonin:4,lacticidven:4)  ) Recent Results (  from the past 240 hour(s))  MRSA PCR Screening     Status: None   Collection Time: 08/29/15  2:20 PM  Result Value Ref Range Status   MRSA by PCR NEGATIVE NEGATIVE Final    Comment:        The GeneXpert MRSA Assay (FDA approved for NASAL specimens only), is one component of a comprehensive MRSA colonization surveillance program. It is not intended to diagnose MRSA infection nor to guide or monitor treatment for MRSA infections.   Body fluid culture     Status: None (Preliminary result)   Collection Time: 08/30/15  5:17 PM  Result Value Ref Range Status   Specimen Description FLUID  Final   Special Requests INTERVERTEBRAL DISC  Final   Gram Stain PENDING  Incomplete   Culture PENDING  Incomplete   Report Status PENDING  Incomplete         Radiology Studies: Mr Brain Wo Contrast  08/29/2015  CLINICAL DATA:  Altered mental status. Patient threatening himself and others. Lethargy. Evaluation for stroke. EXAM: MRI HEAD WITHOUT CONTRAST TECHNIQUE: Multiplanar, multiecho pulse sequences of the brain and surrounding structures were obtained without  intravenous contrast. COMPARISON:  08/03/2015 head CT FINDINGS: The study is mildly motion degraded. There is no evidence of acute infarct, intracranial hemorrhage, mass, midline shift, or extra-axial fluid collection. Cerebral atrophy is within normal limits for age. Patchy cerebral white matter T2 hyperintensities are nonspecific but compatible with mild-to-moderate chronic small vessel ischemic disease. Prior bilateral cataract extraction is noted. Paranasal sinuses and mastoid air cells are clear. Major intracranial vascular flow voids are preserved. IMPRESSION: 1. No acute intracranial abnormality. 2. Mild-to-moderate chronic small vessel ischemic disease. Electronically Signed   By: Logan Bores M.D.   On: 08/29/2015 13:03   Mr Lumbar Spine W Wo Contrast  08/30/2015  ADDENDUM REPORT: 08/30/2015 09:27 ADDENDUM: Study discussed by telephone with Imelda Pillow, Medical Assitant for Dr. Ashok Pall on 08/30/2015 at 0912 hours. Electronically Signed   By: Genevie Ann M.D.   On: 08/30/2015 09:27  08/30/2015  CLINICAL DATA:  75 year old male with lumbar back pain radiating to the left lower extremity status post lumbar spine surgery 07/30/2015. Subsequent encounter. EXAM: MRI LUMBAR SPINE WITHOUT AND WITH CONTRAST TECHNIQUE: Multiplanar and multiecho pulse sequences of the lumbar spine were obtained without and with intravenous contrast. CONTRAST:  42mL MULTIHANCE GADOBENATE DIMEGLUMINE 529 MG/ML IV SOLN COMPARISON:  Outside preoperative lumbar MRI 07/24/2015. Intraoperative radiographs 07/30/2015. FINDINGS: Segmentation:  Normal as seen on the intraoperative radiographs. Alignment: Interval mild L4 superior endplate deformity with mild (5-10%) loss of L4 vertebral body height. Associated mild endplate edema there are and at the adjacent L3 inferior endplate. Postoperative changes at this level further described below. Stable vertebral height and alignment elsewhere. Vertebrae: Endplate marrow edema at L3 and L4 as  stated. Stable and normal bone marrow signal elsewhere. Conus medullaris: Extends to the L1-L2 level and appears normal. No lower thoracic spinal cord signal abnormality. Paraspinal and other soft tissues: Postoperative changes in the posterior paraspinal soft tissues from the L2 spinous process level to the L4 spinous process level. Medial bilateral erector spinae muscle edema and enhancement. Enhancement tracks from the skin surface to the epidural space at L3-L4. There is a a small 15 mm fluid collection overlying the L3 spinous process. See also L3-L4 disc space findings below. No definite prevertebral soft tissue inflammation. There is mild right greater than left medial psoas muscle inflammation and enhancement (series 6, image 1). Disc levels:  The disc space levels L1-L2 and above are stable. The L4-L5 and L5-S1 levels are stable. L2-L3:  Stable. L3-L4: Large cephalad disc extrusion with disc fragment encompassing 7 x 11 x 24 mm (AP by transverse by CC). See series 9, image 12 and series 4, image 8. The L3-L4 disc space now appears to be fluid-filled. The endplates are mildly edematous and enhancing. Moderate facet and ligament flavum hypertrophy Re demonstrated. Postoperative changes possibly to the ligament flavum. Subsequent severe spinal stenosis (series 9, image 14). IMPRESSION: 1. Postoperative changes at the L3-L4 level with superimposed large cephalad disc extrusion (series 4, image 8) causing severe spinal stenosis, as well as abnormal fluid signal in the L3-L4 disc space associated with mild L4 superior endplate fracture with L3 and L4 endplate edema/inflammation. Mild associated psoas muscle inflammation. 2. The appearance is such that discitis osteomyelitis at L3-L4 cannot be excluded. 3. There is also a small postoperative fluid collection overlying the L3 spinous process, favor seroma. Electronically Signed: By: Genevie Ann M.D. On: 08/29/2015 13:39        Scheduled Meds: . allopurinol  300  mg Oral Q lunch  . amLODipine  10 mg Oral Q supper  . antiseptic oral rinse  7 mL Mouth Rinse BID  . aspirin EC  81 mg Oral Q lunch  . atorvastatin  20 mg Oral q1800  . bupivacaine (PF)      . calcitRIOL  0.25 mcg Oral Q M,W,F  . cefTRIAXone (ROCEPHIN)  IV  2 g Intravenous Q24H  . cycloSPORINE  1 drop Both Eyes BID  . feeding supplement (GLUCERNA SHAKE)  237 mL Oral TID BM  . fentaNYL      . finasteride  5 mg Oral QHS  . insulin aspart  0-20 Units Subcutaneous Q4H  . insulin glargine  15 Units Subcutaneous QHS  . ipratropium-albuterol  3 mL Nebulization TID  . Liraglutide  1.2 mg Subcutaneous Q1500  . methylPREDNISolone (SOLU-MEDROL) injection  10 mg Intravenous Q breakfast  . metoprolol  50 mg Oral BID  . midazolam      . mometasone-formoterol  2 puff Inhalation BID  . montelukast  10 mg Oral QHS  . ondansetron      . pilocarpine  1 drop Both Eyes TID  . polyethylene glycol  17 g Oral BID  . predniSONE  7 mg Oral Q breakfast  . senna-docusate  2 tablet Oral BID  . sodium chloride flush  3 mL Intravenous Q12H  . tamsulosin  0.4 mg Oral QHS  . vancomycin  1,000 mg Intravenous Once  . [START ON 08/31/2015] vancomycin  750 mg Intravenous Q12H   Continuous Infusions: . sodium chloride 100 mL/hr at 08/30/15 0700  . sodium chloride 50 mL/hr at 08/30/15 1530     LOS: 2 days    Time spent: 40 minutes    WOODS, Geraldo Docker, MD [facility] Pager [phone]   If 7PM-7AM, please contact night-coverage [email or web address] Password [facility] 08/30/2015, 6:16 PM

## 2015-08-30 NOTE — Progress Notes (Signed)
[facility]  Patient Status: Active (receiving services up to time of hospitalization)  [facility] is providing the following services: RN, PT, OT and ST  If patient discharges after hours, please call [phone].   Jacob [name] 08/30/2015, 11:06 AM

## 2015-08-30 NOTE — Progress Notes (Signed)
Nutrition Follow-up  DOCUMENTATION CODES:   Not applicable  INTERVENTION:   -Continue Glucerna Shake po TID, each supplement provides 220 kcal and 10 grams of protein -Modify diet order to allow chopped meats with extra gravy  NUTRITION DIAGNOSIS:   Predicted suboptimal nutrient intake related to poor appetite, lethargy/confusion as evidenced by percent weight loss.  Ongoing  GOAL:   Patient will meet greater than or equal to 90% of their needs  Progressing  MONITOR:   PO intake, Supplement acceptance, Labs, Weight trends, Skin, I & O's  REASON FOR ASSESSMENT:   Consult, Malnutrition Screening Tool Assessment of nutrition requirement/status  ASSESSMENT:   75 y.o. male with medical history significant of lumbar stenosis Status post laminectomy, COPD, rheumatoid arthritis, hypertension, sleep apnea, CAD, DM 2, neuropathy, PMR, Here with acute encephalopathy in a setting of dehydration   Case discussed with RN. She confirms pt with poor oral intake and refused Glucerna Shake this morning.   Hx obtained from pt, pt wife, and pt daughter at bedside. Pt daughter reports pt has experienced a general decline in health over the past month, since undergoing and recovering from back surgery. She shares that pt was discharged to SNF after previous hospitalization, where he stayed for approximately 2 weeks and then returned home. Pt was making progress with rehab and home health PT up until one week ago, where pt was unable to get up and ambulate.   Pt reports he does not feel like eating. Family shares that pt also has difficulty tolerating tough meats, like dry chicken. Pt was consuming Glucerna supplements PTA and is amenable to continuing here. Meal completion 10-25%. Pt reports he only consumed peaches this morning.   Pt daughter reports pt's UBW is 240# and pt has experienced a 35# wt loss over the past month. Per documented wt hx, pt has experienced a 7.7% wt loss over the past  month, which is significant for time frame.   Nutrition-Focused physical exam completed. Findings are mild fat depletion, no muscle depletion, and no edema.   Labs reviewed: CBGS: 284-375.   Diet Order:  Diet Carb Modified Fluid consistency:: Thin; Room service appropriate?: Yes  Skin:  Reviewed, no issues  Last BM:  PTA  Height:   Ht Readings from Last 1 Encounters:  08/28/15 5\' 11"  (1.803 m)    Weight:   Wt Readings from Last 1 Encounters:  08/29/15 215 lb 9.8 oz (97.8 kg)    Ideal Body Weight:  78.2 kg  BMI:  Body mass index is 30.08 kg/(m^2).  Estimated Nutritional Needs:   Kcal:  2100-2300  Protein:  115-130 grams  Fluid:  2.1-2.3 L  EDUCATION NEEDS:   No education needs identified at this time  Dacey Milberger A. Jimmye Norman, RD, LDN, CDE Pager: [phone] After hours Pager: [phone]

## 2015-08-31 DIAGNOSIS — M79604 Pain in right leg: Secondary | ICD-10-CM

## 2015-08-31 DIAGNOSIS — E119 Type 2 diabetes mellitus without complications: Secondary | ICD-10-CM | POA: Diagnosis not present

## 2015-08-31 DIAGNOSIS — I2510 Atherosclerotic heart disease of native coronary artery without angina pectoris: Secondary | ICD-10-CM | POA: Diagnosis not present

## 2015-08-31 DIAGNOSIS — M353 Polymyalgia rheumatica: Secondary | ICD-10-CM | POA: Diagnosis not present

## 2015-08-31 DIAGNOSIS — F4320 Adjustment disorder, unspecified: Secondary | ICD-10-CM | POA: Diagnosis not present

## 2015-08-31 DIAGNOSIS — Z9889 Other specified postprocedural states: Secondary | ICD-10-CM | POA: Diagnosis not present

## 2015-08-31 DIAGNOSIS — F4323 Adjustment disorder with mixed anxiety and depressed mood: Secondary | ICD-10-CM | POA: Diagnosis not present

## 2015-08-31 DIAGNOSIS — M79605 Pain in left leg: Secondary | ICD-10-CM

## 2015-08-31 DIAGNOSIS — E118 Type 2 diabetes mellitus with unspecified complications: Secondary | ICD-10-CM | POA: Diagnosis not present

## 2015-08-31 DIAGNOSIS — R7989 Other specified abnormal findings of blood chemistry: Secondary | ICD-10-CM | POA: Diagnosis not present

## 2015-08-31 DIAGNOSIS — G9340 Encephalopathy, unspecified: Secondary | ICD-10-CM | POA: Diagnosis not present

## 2015-08-31 DIAGNOSIS — I5030 Unspecified diastolic (congestive) heart failure: Secondary | ICD-10-CM | POA: Diagnosis not present

## 2015-08-31 DIAGNOSIS — R4182 Altered mental status, unspecified: Secondary | ICD-10-CM | POA: Diagnosis not present

## 2015-08-31 DIAGNOSIS — M4646 Discitis, unspecified, lumbar region: Secondary | ICD-10-CM | POA: Diagnosis not present

## 2015-08-31 LAB — GLUCOSE, CAPILLARY
Glucose-Capillary: 104 mg/dL — ABNORMAL HIGH (ref 65–99)
Glucose-Capillary: 115 mg/dL — ABNORMAL HIGH (ref 65–99)
Glucose-Capillary: 145 mg/dL — ABNORMAL HIGH (ref 65–99)
Glucose-Capillary: 162 mg/dL — ABNORMAL HIGH (ref 65–99)
Glucose-Capillary: 196 mg/dL — ABNORMAL HIGH (ref 65–99)
Glucose-Capillary: 227 mg/dL — ABNORMAL HIGH (ref 65–99)
Glucose-Capillary: 359 mg/dL — ABNORMAL HIGH (ref 65–99)

## 2015-08-31 LAB — CBC
HCT: 32.1 % — ABNORMAL LOW (ref 39.0–52.0)
Hemoglobin: 10.2 g/dL — ABNORMAL LOW (ref 13.0–17.0)
MCH: 29.7 pg (ref 26.0–34.0)
MCHC: 31.8 g/dL (ref 30.0–36.0)
MCV: 93.6 fL (ref 78.0–100.0)
Platelets: 244 10*3/uL (ref 150–400)
RBC: 3.43 MIL/uL — ABNORMAL LOW (ref 4.22–5.81)
RDW: 14.6 % (ref 11.5–15.5)
WBC: 8.8 10*3/uL (ref 4.0–10.5)

## 2015-08-31 LAB — COMPREHENSIVE METABOLIC PANEL
ALT: 44 U/L (ref 17–63)
AST: 27 U/L (ref 15–41)
Albumin: 2.4 g/dL — ABNORMAL LOW (ref 3.5–5.0)
Alkaline Phosphatase: 341 U/L — ABNORMAL HIGH (ref 38–126)
Anion gap: 5 (ref 5–15)
BUN: 16 mg/dL (ref 6–20)
CO2: 27 mmol/L (ref 22–32)
Calcium: 9 mg/dL (ref 8.9–10.3)
Chloride: 109 mmol/L (ref 101–111)
Creatinine, Ser: 1.29 mg/dL — ABNORMAL HIGH (ref 0.61–1.24)
GFR calc Af Amer: >60 mL/min (ref >60)
GFR calc non Af Amer: 53 mL/min — ABNORMAL LOW (ref >60)
Glucose, Bld: 163 mg/dL — ABNORMAL HIGH (ref 65–99)
Potassium: 3.5 mmol/L (ref 3.5–5.1)
Sodium: 141 mmol/L (ref 135–145)
Total Bilirubin: 0.6 mg/dL (ref 0.3–1.2)
Total Protein: 5.5 g/dL — ABNORMAL LOW (ref 6.5–8.1)

## 2015-08-31 LAB — MAGNESIUM: Magnesium: 1.7 mg/dL (ref 1.7–2.4)

## 2015-08-31 MED ORDER — SODIUM CHLORIDE 0.9% FLUSH
10.0000 mL | Freq: Two times a day (BID) | INTRAVENOUS | Status: DC
  Administered 2015-08-31: 10 mL
  Administered 2015-08-31: 20 mL
  Administered 2015-09-01 – 2015-09-09 (×6): 10 mL

## 2015-08-31 MED ORDER — SODIUM CHLORIDE 0.9% FLUSH
10.0000 mL | INTRAVENOUS | Status: DC | PRN
  Administered 2015-09-06 – 2015-09-09 (×3): 10 mL
  Filled 2015-08-31 (×3): qty 40

## 2015-08-31 MED ORDER — OXYCODONE HCL 5 MG PO TABS
5.0000 mg | ORAL_TABLET | Freq: Four times a day (QID) | ORAL | Status: DC | PRN
  Administered 2015-08-31 – 2015-09-02 (×5): 5 mg via ORAL
  Filled 2015-08-31 (×6): qty 1

## 2015-08-31 MED ORDER — HYDROCODONE-ACETAMINOPHEN 5-325 MG PO TABS
1.0000 | ORAL_TABLET | ORAL | Status: DC | PRN
  Administered 2015-08-31: 1 via ORAL
  Filled 2015-08-31: qty 1

## 2015-08-31 MED ORDER — CYCLOBENZAPRINE HCL 10 MG PO TABS: 5.0000 mg | ORAL_TABLET | Freq: Four times a day (QID) | ORAL | Status: DC | PRN

## 2015-08-31 NOTE — Progress Notes (Signed)
[facility]    Date of Admission:  08/28/2015   Total days of antibiotics 2        Day 2 ceftriaxone        Day 2 vanco          ID: Jacob [name] is a 75 y.o. male with hx of L3-L4 discecteomy on 07/30/15 who had no difficulty with incision healing, though has had increasing bilateral leg pain with AMS due to pain meds. His repeat MRI showed disc fragment at L3-4 that could account for some of discomfort but also fluid/inflammation in the area that could be early discitis PPD#1 IR guided aspirate on 6/16  Active Problems:   CAD (coronary artery disease)   Diabetes mellitus, type 2 (HCC)   Neuropathy (HCC)   Rheumatoid arthritis ([place])   CHF with left ventricular diastolic dysfunction, NYHA class 2 (HCC)   OSA (obstructive sleep apnea)   Altered mental status   History of COPD   Spinal stenosis of lumbar region with radiculopathy   Renal cancer ([place])   Acute encephalopathy   Elevated troponin   Elevated LFTs   CAD in native artery   Uncontrolled type 2 diabetes mellitus with complication (HCC)   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 He tolerates flexeril and oxycodone at home per his daughter's report, and she states that temazepam/other benzo have made him encephalopathic. He reports remote hx of mrsa infection  Still has bilateral leg pain, no fevers  24hr event: Ir aspirate as well as picc line placement this morning  Medications:  . allopurinol  300 mg Oral Q lunch  . amLODipine  10 mg Oral Q supper  . antiseptic oral rinse  7 mL Mouth Rinse BID  . aspirin EC  81 mg Oral Q lunch  . atorvastatin  20 mg Oral q1800  . calcitRIOL  0.25 mcg Oral Q M,W,F  . cefTRIAXone (ROCEPHIN)  IV  2 g Intravenous Q24H  . cycloSPORINE  1 drop Both Eyes BID    . feeding supplement (GLUCERNA SHAKE)  237 mL Oral TID BM  . finasteride  5 mg Oral QHS  . insulin aspart  0-20 Units Subcutaneous Q4H  . insulin glargine  15 Units Subcutaneous QHS  . ipratropium-albuterol  3 mL Nebulization TID  . Liraglutide  1.2 mg Subcutaneous Q1500  . metoprolol  50 mg Oral BID  . mometasone-formoterol  2 puff Inhalation BID  . montelukast  10 mg Oral QHS  . pilocarpine  1 drop Both Eyes TID  . polyethylene glycol  17 g Oral BID  . predniSONE  7 mg Oral Q breakfast  . senna-docusate  2 tablet Oral BID  . sodium chloride flush  10-40 mL Intracatheter Q12H  . sodium chloride flush  3 mL Intravenous Q12H  . tamsulosin  0.4 mg Oral QHS  . vancomycin  750 mg Intravenous Q12H    Objective: Vital signs in last 24 hours: Temp:  [97.1 F (36.2 C)-98 F (36.7 C)] 97.5 F (36.4 C) (06/17 1151) Pulse Rate:  [84-104] 95 (06/17 0900) Resp:  [14-28] 18 (06/17 0900) BP: (81-158)/(43-94) 132/75 mmHg (06/17 0900) SpO2:  [94 %-100 %] 95 % (06/17 0900) Weight:  [211 lb 6.7 oz (95.9 kg)] 211 lb 6.7 oz (95.9 kg) (  06/17 0500) Physical Exam  Constitutional: He is oriented to person, place, and time. He appears well-developed and well-nourished. No distress.  HENT:  Mouth/Throat: Oropharynx is clear and moist. No oropharyngeal exudate.  Cardiovascular: Normal rate, regular rhythm and normal heart sounds. Exam reveals no gallop and no friction rub.  No murmur heard.  Pulmonary/Chest: Effort normal and breath sounds normal. No respiratory distress. He has no wheezes.  Abdominal: Soft. Bowel sounds are normal. He exhibits no distension. There is no tenderness.  Lymphadenopathy:  He has no cervical adenopathy.  Neurological: He is alert and oriented to person, place, and time. Did limited leg movement, restrained from full exam due to pain Skin: Skin is warm and dry. No rash noted. No erythema.  Psychiatric: He has a normal mood and affect. His behavior is normal.     Lab  Results  Recent Labs  08/30/15 0310 08/31/15 0315  WBC 6.0 8.8  HGB 10.9* 10.2*  HCT 34.8* 32.1*  NA 140 141  K 3.5 3.5  CL 106 109  CO2 26 27  BUN 16 16  CREATININE 1.35* 1.29*   Liver Panel  Recent Labs  08/30/15 0310 08/31/15 0315  PROT 5.8* 5.5*  ALBUMIN 2.4* 2.4*  AST 25 27  ALT 53 44  ALKPHOS 384* 341*  BILITOT 0.8 0.6   Sedimentation Rate  Recent Labs  08/29/15 1034  ESRSEDRATE 90*   C-Reactive Protein  Recent Labs  08/29/15 1034  CRP 17.1*    Microbiology: 6/16 blood cx ngtd 6/16 body fluid ngtd Studies/Results: FINDINGS: Segmentation: Normal as seen on the intraoperative radiographs.  Alignment: Interval mild L4 superior endplate deformity with mild (5-10%) loss of L4 vertebral body height. Associated mild endplate edema there are and at the adjacent L3 inferior endplate. Postoperative changes at this level further described below. Stable vertebral height and alignment elsewhere.  Vertebrae: Endplate marrow edema at L3 and L4 as stated. Stable and normal bone marrow signal elsewhere.  Conus medullaris: Extends to the L1-L2 level and appears normal. No lower thoracic spinal cord signal abnormality.  Paraspinal and other soft tissues: Postoperative changes in the posterior paraspinal soft tissues from the L2 spinous process level to the L4 spinous process level. Medial bilateral erector spinae muscle edema and enhancement. Enhancement tracks from the skin surface to the epidural space at L3-L4. There is a a small 15 mm fluid collection overlying the L3 spinous process. See also L3-L4 disc space findings below.  No definite prevertebral soft tissue inflammation. There is mild right greater than left medial psoas muscle inflammation and enhancement (series 6, image 1).  Disc levels:  The disc space levels L1-L2 and above are stable. The L4-L5 and L5-S1 levels are stable.  L2-L3: Stable.  L3-L4: Large cephalad disc  extrusion with disc fragment encompassing 7 x 11 x 24 mm (AP by transverse by CC). See series 9, image 12 and series 4, image 8. The L3-L4 disc space now appears to be fluid-filled. The endplates are mildly edematous and enhancing. Moderate facet and ligament flavum hypertrophy Re demonstrated. Postoperative changes possibly to the ligament flavum. Subsequent severe spinal stenosis (series 9, image 14).  IMPRESSION: 1. Postoperative changes at the L3-L4 level with superimposed large cephalad disc extrusion (series 4, image 8) causing severe spinal stenosis, as well as abnormal fluid signal in the L3-L4 disc space associated with mild L4 superior endplate fracture with L3 and L4 endplate edema/inflammation. Mild associated psoas muscle inflammation. 2. The appearance is such that discitis osteomyelitis  at L3-L4 cannot be excluded. 3. There is also a small postoperative fluid collection overlying the L3 spinous process, favor seroma.  Assessment/Plan:  possible early discitis/roughly 30 days from initial discectomy = he appeared to have good incisional healing, no signs of drainage or erythema. i wonderi if all the MRI findings could be due to disc fragment. Nonetheless, it would be prudent to cover for early infection. patient is currently on vancomycin and ceftriaxone empirically. We spent majority of the time discussing the merit of having repeat surgery in order to remove disc fragment that is likely causing worsening pain.   Leg/back pain = avoid benzos as much as possible. If still AMS sans benzo, may consider switching to robaxin instead of flexeril.  Will follow up on culture results  Spent 16min with patient and family with greater than 50% of time counseling on post surgical site infection and managment  Persephone Schriever, [facility] Cell: [phone] Pager: [phone]  08/31/2015, 1:29 PM

## 2015-08-31 NOTE — Progress Notes (Signed)
Patient ID: Jacob [name], male   DOB: 1940-08-26, 75 y.o.   MRN: [id number] Subjective:  The patient is alert, he is a bit confused. His wife is at the bedside.  Objective: Vital signs in last 24 hours: Temp:  [97.3 F (36.3 C)-98 F (36.7 C)] 97.9 F (36.6 C) (06/17 0420) Pulse Rate:  [84-104] 84 (06/17 0420) Resp:  [14-28] 15 (06/17 0420) BP: (128-158)/(65-94) 133/76 mmHg (06/17 0420) SpO2:  [94 %-100 %] 100 % (06/17 0420) Weight:  [95.9 kg (211 lb 6.7 oz)] 95.9 kg (211 lb 6.7 oz) (06/17 0500)  Intake/Output from previous day: 06/16 0701 - 06/17 0700 In: 2550 [P.O.:350; I.V.:1850; IV Piggyback:350] Out: 900 [Urine:900] Intake/Output this shift:    Physical exam the patient is alert but confused. He is moving all 4 extremities well. He is in no apparent distress. His dressing is clean and dry.  Lab Results:  Recent Labs  08/30/15 0310 08/31/15 0315  WBC 6.0 8.8  HGB 10.9* 10.2*  HCT 34.8* 32.1*  PLT 248 244   BMET  Recent Labs  08/30/15 0310 08/31/15 0315  NA 140 141  K 3.5 3.5  CL 106 109  CO2 26 27  GLUCOSE 228* 163*  BUN 16 16  CREATININE 1.35* 1.29*  CALCIUM 9.1 9.0    Studies/Results: Mr Brain Wo Contrast  08/29/2015  CLINICAL DATA:  Altered mental status. Patient threatening himself and others. Lethargy. Evaluation for stroke. EXAM: MRI HEAD WITHOUT CONTRAST TECHNIQUE: Multiplanar, multiecho pulse sequences of the brain and surrounding structures were obtained without intravenous contrast. COMPARISON:  08/03/2015 head CT FINDINGS: The study is mildly motion degraded. There is no evidence of acute infarct, intracranial hemorrhage, mass, midline shift, or extra-axial fluid collection. Cerebral atrophy is within normal limits for age. Patchy cerebral white matter T2 hyperintensities are nonspecific but compatible with mild-to-moderate chronic small vessel ischemic disease. Prior bilateral cataract extraction is noted. Paranasal sinuses and mastoid air cells  are clear. Major intracranial vascular flow voids are preserved. IMPRESSION: 1. No acute intracranial abnormality. 2. Mild-to-moderate chronic small vessel ischemic disease. Electronically Signed   By: Logan Bores M.D.   On: 08/29/2015 13:03   Mr Lumbar Spine W Wo Contrast  08/30/2015  ADDENDUM REPORT: 08/30/2015 09:27 ADDENDUM: Study discussed by telephone with Imelda Pillow, Medical Assitant for Dr. Ashok Pall on 08/30/2015 at 0912 hours. Electronically Signed   By: Genevie Ann M.D.   On: 08/30/2015 09:27  08/30/2015  CLINICAL DATA:  75 year old male with lumbar back pain radiating to the left lower extremity status post lumbar spine surgery 07/30/2015. Subsequent encounter. EXAM: MRI LUMBAR SPINE WITHOUT AND WITH CONTRAST TECHNIQUE: Multiplanar and multiecho pulse sequences of the lumbar spine were obtained without and with intravenous contrast. CONTRAST:  65mL MULTIHANCE GADOBENATE DIMEGLUMINE 529 MG/ML IV SOLN COMPARISON:  Outside preoperative lumbar MRI 07/24/2015. Intraoperative radiographs 07/30/2015. FINDINGS: Segmentation:  Normal as seen on the intraoperative radiographs. Alignment: Interval mild L4 superior endplate deformity with mild (5-10%) loss of L4 vertebral body height. Associated mild endplate edema there are and at the adjacent L3 inferior endplate. Postoperative changes at this level further described below. Stable vertebral height and alignment elsewhere. Vertebrae: Endplate marrow edema at L3 and L4 as stated. Stable and normal bone marrow signal elsewhere. Conus medullaris: Extends to the L1-L2 level and appears normal. No lower thoracic spinal cord signal abnormality. Paraspinal and other soft tissues: Postoperative changes in the posterior paraspinal soft tissues from the L2 spinous process level to the L4 spinous  process level. Medial bilateral erector spinae muscle edema and enhancement. Enhancement tracks from the skin surface to the epidural space at L3-L4. There is a a small 15 mm  fluid collection overlying the L3 spinous process. See also L3-L4 disc space findings below. No definite prevertebral soft tissue inflammation. There is mild right greater than left medial psoas muscle inflammation and enhancement (series 6, image 1). Disc levels: The disc space levels L1-L2 and above are stable. The L4-L5 and L5-S1 levels are stable. L2-L3:  Stable. L3-L4: Large cephalad disc extrusion with disc fragment encompassing 7 x 11 x 24 mm (AP by transverse by CC). See series 9, image 12 and series 4, image 8. The L3-L4 disc space now appears to be fluid-filled. The endplates are mildly edematous and enhancing. Moderate facet and ligament flavum hypertrophy Re demonstrated. Postoperative changes possibly to the ligament flavum. Subsequent severe spinal stenosis (series 9, image 14). IMPRESSION: 1. Postoperative changes at the L3-L4 level with superimposed large cephalad disc extrusion (series 4, image 8) causing severe spinal stenosis, as well as abnormal fluid signal in the L3-L4 disc space associated with mild L4 superior endplate fracture with L3 and L4 endplate edema/inflammation. Mild associated psoas muscle inflammation. 2. The appearance is such that discitis osteomyelitis at L3-L4 cannot be excluded. 3. There is also a small postoperative fluid collection overlying the L3 spinous process, favor seroma. Electronically Signed: By: Genevie Ann M.D. On: 08/29/2015 13:39    Assessment/Plan: Postop day #3: Patient's confusion is likely multifactorial and will likely resolve with time. We will continue observation in the ICU.  LOS: 3 days     Kaelea Gathright D 08/31/2015, 7:35 AM

## 2015-08-31 NOTE — Progress Notes (Signed)
Patient ID: Jacob [name], male   DOB: Sep 23, 1940, 75 y.o.   MRN: [id number] Subjective:  The previous note was dictated on the wrong patient so please disregard it. The patient is alert and pleasant. By report he was a bit confused last night after he took the Flexeril.  Objective: Vital signs in last 24 hours: Temp:  [97.3 F (36.3 C)-98 F (36.7 C)] 97.9 F (36.6 C) (06/17 0420) Pulse Rate:  [84-104] 84 (06/17 0420) Resp:  [14-28] 15 (06/17 0420) BP: (128-158)/(65-94) 133/76 mmHg (06/17 0420) SpO2:  [94 %-100 %] 100 % (06/17 0420) Weight:  [95.9 kg (211 lb 6.7 oz)] 95.9 kg (211 lb 6.7 oz) (06/17 0500)  Intake/Output from previous day: 06/16 0701 - 06/17 0700 In: 2550 [P.O.:350; I.V.:1850; IV Piggyback:350] Out: 900 [Urine:900] Intake/Output this shift:    Physical exam patient is alert and oriented. He is moving his lower extremities well.  Lab Results:  Recent Labs  08/30/15 0310 08/31/15 0315  WBC 6.0 8.8  HGB 10.9* 10.2*  HCT 34.8* 32.1*  PLT 248 244   BMET  Recent Labs  08/30/15 0310 08/31/15 0315  NA 140 141  K 3.5 3.5  CL 106 109  CO2 26 27  GLUCOSE 228* 163*  BUN 16 16  CREATININE 1.35* 1.29*  CALCIUM 9.1 9.0    Studies/Results: Mr Brain Wo Contrast  08/29/2015  CLINICAL DATA:  Altered mental status. Patient threatening himself and others. Lethargy. Evaluation for stroke. EXAM: MRI HEAD WITHOUT CONTRAST TECHNIQUE: Multiplanar, multiecho pulse sequences of the brain and surrounding structures were obtained without intravenous contrast. COMPARISON:  08/03/2015 head CT FINDINGS: The study is mildly motion degraded. There is no evidence of acute infarct, intracranial hemorrhage, mass, midline shift, or extra-axial fluid collection. Cerebral atrophy is within normal limits for age. Patchy cerebral white matter T2 hyperintensities are nonspecific but compatible with mild-to-moderate chronic small vessel ischemic disease. Prior bilateral cataract extraction is  noted. Paranasal sinuses and mastoid air cells are clear. Major intracranial vascular flow voids are preserved. IMPRESSION: 1. No acute intracranial abnormality. 2. Mild-to-moderate chronic small vessel ischemic disease. Electronically Signed   By: Logan Bores M.D.   On: 08/29/2015 13:03   Mr Lumbar Spine W Wo Contrast  08/30/2015  ADDENDUM REPORT: 08/30/2015 09:27 ADDENDUM: Study discussed by telephone with Imelda Pillow, Medical Assitant for Dr. Ashok Pall on 08/30/2015 at 0912 hours. Electronically Signed   By: Genevie Ann M.D.   On: 08/30/2015 09:27  08/30/2015  CLINICAL DATA:  75 year old male with lumbar back pain radiating to the left lower extremity status post lumbar spine surgery 07/30/2015. Subsequent encounter. EXAM: MRI LUMBAR SPINE WITHOUT AND WITH CONTRAST TECHNIQUE: Multiplanar and multiecho pulse sequences of the lumbar spine were obtained without and with intravenous contrast. CONTRAST:  49mL MULTIHANCE GADOBENATE DIMEGLUMINE 529 MG/ML IV SOLN COMPARISON:  Outside preoperative lumbar MRI 07/24/2015. Intraoperative radiographs 07/30/2015. FINDINGS: Segmentation:  Normal as seen on the intraoperative radiographs. Alignment: Interval mild L4 superior endplate deformity with mild (5-10%) loss of L4 vertebral body height. Associated mild endplate edema there are and at the adjacent L3 inferior endplate. Postoperative changes at this level further described below. Stable vertebral height and alignment elsewhere. Vertebrae: Endplate marrow edema at L3 and L4 as stated. Stable and normal bone marrow signal elsewhere. Conus medullaris: Extends to the L1-L2 level and appears normal. No lower thoracic spinal cord signal abnormality. Paraspinal and other soft tissues: Postoperative changes in the posterior paraspinal soft tissues from the L2 spinous process  level to the L4 spinous process level. Medial bilateral erector spinae muscle edema and enhancement. Enhancement tracks from the skin surface to the  epidural space at L3-L4. There is a a small 15 mm fluid collection overlying the L3 spinous process. See also L3-L4 disc space findings below. No definite prevertebral soft tissue inflammation. There is mild right greater than left medial psoas muscle inflammation and enhancement (series 6, image 1). Disc levels: The disc space levels L1-L2 and above are stable. The L4-L5 and L5-S1 levels are stable. L2-L3:  Stable. L3-L4: Large cephalad disc extrusion with disc fragment encompassing 7 x 11 x 24 mm (AP by transverse by CC). See series 9, image 12 and series 4, image 8. The L3-L4 disc space now appears to be fluid-filled. The endplates are mildly edematous and enhancing. Moderate facet and ligament flavum hypertrophy Re demonstrated. Postoperative changes possibly to the ligament flavum. Subsequent severe spinal stenosis (series 9, image 14). IMPRESSION: 1. Postoperative changes at the L3-L4 level with superimposed large cephalad disc extrusion (series 4, image 8) causing severe spinal stenosis, as well as abnormal fluid signal in the L3-L4 disc space associated with mild L4 superior endplate fracture with L3 and L4 endplate edema/inflammation. Mild associated psoas muscle inflammation. 2. The appearance is such that discitis osteomyelitis at L3-L4 cannot be excluded. 3. There is also a small postoperative fluid collection overlying the L3 spinous process, favor seroma. Electronically Signed: By: Genevie Ann M.D. On: 08/29/2015 13:39    Assessment/Plan: Discitis: We are awaiting the needle aspiration results. He is being treated empirically with antibiotics.  LOS: 3 days     Vista Sawatzky D 08/31/2015, 7:37 AM

## 2015-08-31 NOTE — Progress Notes (Signed)
Peripherally Inserted Central Catheter/Midline Placement  The IV Nurse has discussed with the patient and/or persons authorized to consent for the patient, the purpose of this procedure and the potential benefits and risks involved with this procedure.  The benefits include less needle sticks, lab draws from the catheter and patient may be discharged home with the catheter.  Risks include, but not limited to, infection, bleeding, blood clot (thrombus formation), and puncture of an artery; nerve damage and irregular heat beat.  Alternatives to this procedure were also discussed.  Also spoke with wife on phone at pt's request.  Wife also in agreement.  PICC/Midline Placement Documentation  PICC Single Lumen 08/31/15 PICC Right Brachial 39 cm 0 cm (Active)  Indication for Insertion or Continuance of Line Home intravenous therapies (PICC only) 08/31/2015  8:52 AM  Exposed Catheter (cm) 0 cm 08/31/2015  8:52 AM  Site Assessment Clean;Dry;Intact 08/31/2015  8:52 AM  Line Status Flushed;Saline locked;Blood return noted 08/31/2015  8:52 AM  Dressing Type Transparent 08/31/2015  8:52 AM  Dressing Status Clean;Dry;Intact 08/31/2015  8:52 AM  Dressing Change Due 09/07/15 08/31/2015  8:52 AM       Gordan Payment 08/31/2015, 8:53 AM

## 2015-08-31 NOTE — Progress Notes (Signed)
PROGRESS NOTE    Jacob [name]  [id number] DOB: [birth year] DOA: 08/28/2015 PCP: [name], MD   Brief Narrative:  75 y.o. WM PMHx Anxiety, Lumbar stenosis S/P laminectomy and Discectomy May 2017, COPD, RA, HTN, OSA, CAD native artery S/P CABG 7 vessel, DM Type 2 uncontrolled with complication, DM neuropathy, PMR, CKD, Cancer of kidney, Hepatitis   Presented with worsening encephalopathy for the past 24 hours has been doing worse overall. He was discharged to home on June 8th. On 10th of June he started to get progressively worse. He was seen in office by Dr. Christella Noa and was doing better. But started to threaten family he had psych eval at [facility] and was told to go to PCP. Today they went to ER at [facility] care. He was seen in [place] ED. patient had extensive workup chest x-ray was unremarkable UA was concentrated but no evidence of UTI. CT scan of the head was non-acute. U tox positive for benzos and opioids. Troponin was slightly elevated but no EKG changes suggestive of ischemia. They were planing to do MRI to see what is going on he was given something for anxiety and have been lethargic since. where he have gone imaging of his back which was inconclusive for infection versus surgical changes but one does not appear to be infected. He was noted to have elevated LFTs ultrasound was done showing no evidence of cholecystitis. Creatinine was found to be 1.3 which is above his baseline. Chest x-ray while in the facility was unremarkable. Patient was accepted in transfer to stepdown to [facility] Family reports that he has not been eating and drinking for past 3-4 days. He has not taken much of his home medications he did have a dose of Percocet this morning and Flexeril.   Regarding pertinent Chronic problems: In May patient had a fall in the bathtub MRI of the back show large herniated nucleus pulposus at L3-4 with severe lumbar stenosis. He has undergone lumbar laminectomy discectomy at  the level L3/4 by Dr. Christella Noa In May this was complicated by pneumonia acute renal failure she was discharged to nursing home [facility] place and 26th of May Blood pressure at baseline controlled with amlodipine and Lopressor While in the nursing home he finished Augmentin for his age Pneumonia She has history of polymyalgia rheumatica for which she takes prednisone 7 mg daily Last echogram done in May 2017 showing preserved EF no evidence of diastolic dysfunction   Assessment & Plan:   Active Problems:   CAD (coronary artery disease)   Diabetes mellitus, type 2 (HCC)   Neuropathy (HCC)   Rheumatoid arthritis ([place])   CHF with left ventricular diastolic dysfunction, NYHA class 2 (HCC)   OSA (obstructive sleep apnea)   Altered mental status   History of COPD   Spinal stenosis of lumbar region with radiculopathy   Renal cancer (HCC)   Acute encephalopathy   Elevated troponin   Elevated LFTs   CAD in native artery   Uncontrolled type 2 diabetes mellitus with complication (HCC)   Rheumatoid arthritis involving vertebra with positive rheumatoid factor ([place])   Spinal stenosis of lumbar region   Controlled diabetes mellitus type 2 with complications (HCC)   Acute encephalopathy  - the setting of dehydration and polypharmacy.  -Resolved -Care for use of narcotics and other stating medication  -Normal saline 52ml/hrs   CAD (coronary artery disease)native artery  -Metoprolol 50 mg BID -Labetalol IV PRN   CHF with left ventricular diastolic dysfunction, NYHA class  2  - last echogram was done in May 2017 show no evidence of CHF. -repeat Echocardiogram; essentially normal see results below   -strict in and out since admission +3.8 L -Daily weight Filed Weights   08/28/15 2220 08/29/15 1621 08/31/15 0500  Weight: 95.9 kg (211 lb 6.7 oz) 97.8 kg (215 lb 9.8 oz) 95.9 kg (211 lb 6.7 oz)   OSA (obstructive sleep apnea)  -patient states has CPAP machine at home but unable to use  secondary to claustrophobia. -Titrate O2 to maintain SPO2> 93%   Rheumatoid arthritis  -prednisone 7 mg daily  Spinal stenosis of lumbar region with radiculopathy status post laminectomy  -questionable changes on MRI. Infection vs expected post surgical changes? Will await neurosurgery input. -patient stated had extreme pain at home currently asymptomatic -6/16 spoke with Dr[facility]Benjamin [facility] neurosurgery. After reviewing case he also concerned that could be early infection. He Spoke with family offered to perform surgery to ensure no infection, however patient and family declined. Did agree if necessary would allow needle aspiration of fluid. -Requested IR aspirate fluid for culture -spoke with Dr. Carlyle Basques ID concurs with plan recommend starting patient on ceftriaxone and vancomycin once aspirate obtained -Per ID 6 weeks antibiotics. PICC line placed  Renal cancer  -S/P Ablation, stable   CKD (baseline Cr~1.49-2.0) Lab Results  Component Value Date   CREATININE 1.29* 08/31/2015   CREATININE 1.35* 08/30/2015   CREATININE 1.44* 08/29/2015  -At baseline  Elevated troponin  -asymptomatic most likely secondary to demand ischemia.   Diabetes mellitus type 2 controlled with complications -[id number] hemoglobin A1c= 6.8 -Increase Lantus15 units daily -Increase Resistant SSI  Elevated LFTs - outside hospital. Obtained ultrasound that showed no evidence of gallstone disease or cholecystitis possible dehydration related  Pain management  -OxyIR 5 mg QID PRN -Flexeril 5 mg TID PRN  Goals of care -Patient has requested that we have a family meeting to discuss short-term/long-term goals of care on Sunday 6/18 at 0900     DVT prophylaxis: SCD Code Status: Full Family Communication; none Disposition Plan: resolution of acute encephalopathy//increased  L-spine pain    Consultants:  Dr. Kevan Ny [facility] Neuro[facility]   Dr. Carlyle Basques ID   Procedures/Significant  Events:  6/15 MRI brain;negative acute CVA-mild to moderate  Chronic small vessel ischemic disease 6/15 L spine W/WO contrast;-Postoperative changes at the L3-L4 level with superimposed large cephalad disc extrusion causing severe spinal stenosis, as well as abnormal fluid signal in the L3-L4 disc space associated with mild L4 superior endplate fracture with L3 and L4 endplate edema/inflammation.  -Mild associated psoas muscle inflammation. -Discitis/ osteomyelitis at L3-L4?? -Small postoperative fluid collection overlying the L3 spinous process, favor seroma. 6/15 echocardiogram;Left ventricle: mildly dilated.-LVEF= 55% - 60%.- (grade 1 diastolic dysfunction).   Cultures 6/16 blood pending  6/16 L-spine aspirated fluid NGTD    Antimicrobials: Ceftriaxone 6/16>> Vancomycin 6/16>>   Devices    LINES / TUBES:  PIC midline 6/17>>    Continuous Infusions: . sodium chloride 1,000 mL (08/31/15 1854)     Subjective: 6/17 A/O 4, Patient appears to be changes mind on neurosurgery  vs long-term antibiotic. Will discuss at goals of care meeting in a.m.     Objective: Filed Vitals:   08/31/15 1435 08/31/15 1600 08/31/15 1624 08/31/15 1633  BP:  147/78  147/78  Pulse:  98    Temp:   97.8 F (36.6 C)   TempSrc:   Oral   Resp:  18    Height:  Weight:      SpO2: 100% 94%      Intake/Output Summary (Last 24 hours) at 08/31/15 1900 Last data filed at 08/31/15 1624  Gross per 24 hour  Intake    950 ml  Output   1400 ml  Net   -450 ml   Filed Weights   08/28/15 2220 08/29/15 1621 08/31/15 0500  Weight: 95.9 kg (211 lb 6.7 oz) 97.8 kg (215 lb 9.8 oz) 95.9 kg (211 lb 6.7 oz)    Examination:  General:A/O 4, follows all commands, No acute respiratory distress Eyes: negative scleral hemorrhage, negative anisocoria, negative icterus ENT: Negative Runny nose, negative gingival bleeding, Neck:  Negative scars, masses, torticollis, lymphadenopathy, JVD Lungs: Clear  to auscultation bilaterally without wheezes or crackles Cardiovascular: Regular rate and rhythm without murmur gallop or rub normal S1 and S2 Abdomen: negative abdominal pain, nondistended, positive soft, bowel sounds, no rebound, no ascites, no appreciable mass Extremities: No significant cyanosis, clubbing, or edema bilateral lower extremities Skin: well-healed incision medial aspect of LLE Psychiatric:  Negative depression, negative anxiety, negative fatigue, negative mania  Central nervous system:  Cranial nerves II through XII intact, tongue/uvula midline, all extremities muscle strength 5/5, sensation intact throughout,negative dysarthria, negative expressive aphasia, negative receptive aphasia.      Data Reviewed: Care during the described time interval was provided by me .  I have reviewed this patient's available data, including medical history, events of note, physical examination, and all test results as part of my evaluation. I have personally reviewed and interpreted all radiology studies.  CBC:  Recent Labs Lab 08/28/15 2312 08/29/15 1034 08/30/15 0310 08/31/15 0315  WBC 6.4 6.3 6.0 8.8  NEUTROABS 3.8  --   --   --   HGB 11.8* 11.3* 10.9* 10.2*  HCT 37.5* 36.2* 34.8* 32.1*  MCV 95.2 95.5 93.3 93.6  PLT 254 250 248 [id number]   Basic Metabolic Panel:  Recent Labs Lab 08/28/15 2312 08/29/15 1034 08/30/15 0310 08/31/15 0315  NA 138 140 140 141  K 3.5 3.5 3.5 3.5  CL 103 106 106 109  CO2 24 25 26 27   GLUCOSE 237* 184* 228* 163*  BUN 13 12 16 16   CREATININE 1.61* 1.44* 1.35* 1.29*  CALCIUM 9.2 9.0 9.1 9.0  MG  --  1.6* 1.7 1.7  PHOS  --  3.8  --   --    GFR: Estimated Creatinine Clearance: 58.4 mL/min (by C-G formula based on Cr of 1.29). Liver Function Tests:  Recent Labs Lab 08/28/15 2312 08/29/15 1034 08/30/15 0310 08/31/15 0315  AST 39 31 25 27   ALT 72* 61 53 44  ALKPHOS 461* 407* 384* 341*  BILITOT 1.4* 1.2 0.8 0.6  PROT 6.3* 5.9* 5.8* 5.5*    ALBUMIN 2.8* 2.6* 2.4* 2.4*   No results for input(s): LIPASE, AMYLASE in the last 168 hours.  Recent Labs Lab 08/28/15 2313  AMMONIA 28   Coagulation Profile:  Recent Labs Lab 08/30/15 1317  INR 1.02   Cardiac Enzymes:  Recent Labs Lab 08/28/15 2312 08/29/15 1034 08/30/15 0310  TROPONINI 0.05* 0.04*  0.04* 0.03   BNP (last 3 results) No results for input(s): PROBNP in the last 8760 hours. HbA1C:  Recent Labs  08/29/15 1034  HGBA1C 6.8*   CBG:  Recent Labs Lab 08/30/15 2355 08/31/15 0419 08/31/15 0823 08/31/15 1149 08/31/15 1621  GLUCAP 227* 145* 104* 115* 196*   Lipid Profile: No results for input(s): CHOL, HDL, LDLCALC, TRIG, CHOLHDL, LDLDIRECT in the  last 72 hours. Thyroid Function Tests:  Recent Labs  08/29/15 1034  TSH 1.709   Anemia Panel: No results for input(s): VITAMINB12, FOLATE, FERRITIN, TIBC, IRON, RETICCTPCT in the last 72 hours. Urine analysis:    Component Value Date/Time   BILIRUBINUR neg 09/09/2013 1233   PROTEINUR >=300 09/09/2013 1233   UROBILINOGEN 0.2 09/09/2013 1233   NITRITE neg 09/09/2013 1233   LEUKOCYTESUR Negative 09/09/2013 1233   Sepsis Labs: @LABRCNTIP (procalcitonin:4,lacticidven:4)  ) Recent Results (from the past 240 hour(s))  MRSA PCR Screening     Status: None   Collection Time: 08/29/15  2:20 PM  Result Value Ref Range Status   MRSA by PCR NEGATIVE NEGATIVE Final    Comment:        The GeneXpert MRSA Assay (FDA approved for NASAL specimens only), is one component of a comprehensive MRSA colonization surveillance program. It is not intended to diagnose MRSA infection nor to guide or monitor treatment for MRSA infections.   Culture, blood (routine x 2)     Status: None (Preliminary result)   Collection Time: 08/30/15  1:25 PM  Result Value Ref Range Status   Specimen Description BLOOD RIGHT ANTECUBITAL  Final   Special Requests IN PEDIATRIC BOTTLE 2CC  Final   Culture NO GROWTH 1 DAY  Final    Report Status PENDING  Incomplete  Culture, blood (routine x 2)     Status: None (Preliminary result)   Collection Time: 08/30/15  1:30 PM  Result Value Ref Range Status   Specimen Description BLOOD RIGHT ANTECUBITAL  Final   Special Requests IN PEDIATRIC BOTTLE 3CC  Final   Culture NO GROWTH 1 DAY  Final   Report Status PENDING  Incomplete  Body fluid culture     Status: None (Preliminary result)   Collection Time: 08/30/15  5:17 PM  Result Value Ref Range Status   Specimen Description FLUID  Final   Special Requests INTERVERTEBRAL DISC  Final   Gram Stain   Final    RARE WBC PRESENT,BOTH PMN AND MONONUCLEAR NO ORGANISMS SEEN    Culture NO GROWTH < 24 HOURS  Final   Report Status PENDING  Incomplete         Radiology Studies: No results found.      Scheduled Meds: . allopurinol  300 mg Oral Q lunch  . amLODipine  10 mg Oral Q supper  . antiseptic oral rinse  7 mL Mouth Rinse BID  . aspirin EC  81 mg Oral Q lunch  . atorvastatin  20 mg Oral q1800  . calcitRIOL  0.25 mcg Oral Q M,W,F  . cefTRIAXone (ROCEPHIN)  IV  2 g Intravenous Q24H  . cycloSPORINE  1 drop Both Eyes BID  . feeding supplement (GLUCERNA SHAKE)  237 mL Oral TID BM  . finasteride  5 mg Oral QHS  . insulin aspart  0-20 Units Subcutaneous Q4H  . insulin glargine  15 Units Subcutaneous QHS  . ipratropium-albuterol  3 mL Nebulization TID  . Liraglutide  1.2 mg Subcutaneous Q1500  . metoprolol  50 mg Oral BID  . mometasone-formoterol  2 puff Inhalation BID  . montelukast  10 mg Oral QHS  . pilocarpine  1 drop Both Eyes TID  . polyethylene glycol  17 g Oral BID  . predniSONE  7 mg Oral Q breakfast  . senna-docusate  2 tablet Oral BID  . sodium chloride flush  10-40 mL Intracatheter Q12H  . sodium chloride flush  3 mL Intravenous Q12H  .  tamsulosin  0.4 mg Oral QHS  . vancomycin  750 mg Intravenous Q12H   Continuous Infusions: . sodium chloride 1,000 mL (08/31/15 1854)     LOS: 3 days     Time spent: 40 minutes    WOODS, Geraldo Docker, MD [facility] Pager [phone]   If 7PM-7AM, please contact night-coverage [email or web address] Password [email or web address] 08/31/2015, 7:00 PM

## 2015-08-31 NOTE — Progress Notes (Signed)
Making rounds on patients found patient sitting at end of bed with all clothes off, pulled IV out of shoulder, all leads off and he stated some young girl took him to the [employer]. "Thanks so much for your help." Replaced clothes, reoriented patient, IV restarted and leads replaced. Lights left on in the room and TV turned on. Patient took Flexeril 5mg  before bed last night. Woke up completely disoriented. Will continue to monitor closely.

## 2015-09-01 DIAGNOSIS — I5030 Unspecified diastolic (congestive) heart failure: Secondary | ICD-10-CM | POA: Diagnosis not present

## 2015-09-01 DIAGNOSIS — R4182 Altered mental status, unspecified: Secondary | ICD-10-CM | POA: Diagnosis not present

## 2015-09-01 DIAGNOSIS — F4323 Adjustment disorder with mixed anxiety and depressed mood: Secondary | ICD-10-CM | POA: Diagnosis not present

## 2015-09-01 DIAGNOSIS — F4329 Adjustment disorder with other symptoms: Secondary | ICD-10-CM

## 2015-09-01 DIAGNOSIS — E118 Type 2 diabetes mellitus with unspecified complications: Secondary | ICD-10-CM | POA: Diagnosis not present

## 2015-09-01 DIAGNOSIS — R7989 Other specified abnormal findings of blood chemistry: Secondary | ICD-10-CM | POA: Diagnosis not present

## 2015-09-01 DIAGNOSIS — I2510 Atherosclerotic heart disease of native coronary artery without angina pectoris: Secondary | ICD-10-CM | POA: Diagnosis not present

## 2015-09-01 DIAGNOSIS — G9340 Encephalopathy, unspecified: Secondary | ICD-10-CM | POA: Diagnosis not present

## 2015-09-01 HISTORY — DX: Adjustment disorder with other symptoms: F43.29

## 2015-09-01 LAB — CBC
HCT: 34 % — ABNORMAL LOW (ref 39.0–52.0)
Hemoglobin: 10.6 g/dL — ABNORMAL LOW (ref 13.0–17.0)
MCH: 29 pg (ref 26.0–34.0)
MCHC: 31.2 g/dL (ref 30.0–36.0)
MCV: 93.2 fL (ref 78.0–100.0)
Platelets: 250 10*3/uL (ref 150–400)
RBC: 3.65 MIL/uL — ABNORMAL LOW (ref 4.22–5.81)
RDW: 14.7 % (ref 11.5–15.5)
WBC: 6.6 10*3/uL (ref 4.0–10.5)

## 2015-09-01 LAB — COMPREHENSIVE METABOLIC PANEL
ALT: 45 U/L (ref 17–63)
AST: 29 U/L (ref 15–41)
Albumin: 2.7 g/dL — ABNORMAL LOW (ref 3.5–5.0)
Alkaline Phosphatase: 355 U/L — ABNORMAL HIGH (ref 38–126)
Anion gap: 8 (ref 5–15)
BUN: 10 mg/dL (ref 6–20)
CO2: 28 mmol/L (ref 22–32)
Calcium: 9.2 mg/dL (ref 8.9–10.3)
Chloride: 104 mmol/L (ref 101–111)
Creatinine, Ser: 1.14 mg/dL (ref 0.61–1.24)
GFR calc Af Amer: >60 mL/min (ref >60)
GFR calc non Af Amer: >60 mL/min (ref >60)
Glucose, Bld: 60 mg/dL — ABNORMAL LOW (ref 65–99)
Potassium: 2.8 mmol/L — ABNORMAL LOW (ref 3.5–5.1)
Sodium: 140 mmol/L (ref 135–145)
Total Bilirubin: 0.7 mg/dL (ref 0.3–1.2)
Total Protein: 5.8 g/dL — ABNORMAL LOW (ref 6.5–8.1)

## 2015-09-01 LAB — POTASSIUM: Potassium: 3.2 mmol/L — ABNORMAL LOW (ref 3.5–5.1)

## 2015-09-01 LAB — GLUCOSE, CAPILLARY
Glucose-Capillary: 45 mg/dL — ABNORMAL LOW (ref 65–99)
Glucose-Capillary: 50 mg/dL — ABNORMAL LOW (ref 65–99)
Glucose-Capillary: 64 mg/dL — ABNORMAL LOW (ref 65–99)
Glucose-Capillary: 98 mg/dL (ref 65–99)
Glucose-Capillary: 121 mg/dL — ABNORMAL HIGH (ref 65–99)
Glucose-Capillary: 134 mg/dL — ABNORMAL HIGH (ref 65–99)
Glucose-Capillary: 254 mg/dL — ABNORMAL HIGH (ref 65–99)
Glucose-Capillary: 225 mg/dL — ABNORMAL HIGH (ref 65–99)

## 2015-09-01 LAB — MAGNESIUM
Magnesium: 1.6 mg/dL — ABNORMAL LOW (ref 1.7–2.4)
Magnesium: 1.7 mg/dL (ref 1.7–2.4)

## 2015-09-01 MED ORDER — POTASSIUM CHLORIDE 10 MEQ/100ML IV SOLN
10.0000 meq | INTRAVENOUS | Status: AC
  Administered 2015-09-01 (×4): 10 meq via INTRAVENOUS
  Filled 2015-09-01 (×4): qty 100

## 2015-09-01 MED ORDER — METHOCARBAMOL 500 MG PO TABS
500.0000 mg | ORAL_TABLET | Freq: Three times a day (TID) | ORAL | Status: DC | PRN
  Filled 2015-09-01 (×2): qty 1

## 2015-09-01 MED ORDER — MAGNESIUM SULFATE IN D5W 1-5 GM/100ML-% IV SOLN
1.0000 g | Freq: Once | INTRAVENOUS | Status: AC
  Administered 2015-09-01: 1 g via INTRAVENOUS
  Filled 2015-09-01: qty 100

## 2015-09-01 MED ORDER — CYCLOBENZAPRINE HCL 10 MG PO TABS
5.0000 mg | ORAL_TABLET | Freq: Three times a day (TID) | ORAL | Status: DC
  Administered 2015-09-01 – 2015-09-02 (×4): 5 mg via ORAL
  Filled 2015-09-01 (×4): qty 1

## 2015-09-01 MED ORDER — MEGESTROL ACETATE 400 MG/10ML PO SUSP
800.0000 mg | Freq: Every day | ORAL | Status: DC
  Administered 2015-09-01 – 2015-09-04 (×4): 800 mg via ORAL
  Filled 2015-09-01 (×5): qty 20

## 2015-09-01 MED ORDER — INSULIN GLARGINE 100 UNIT/ML ~~LOC~~ SOLN
12.0000 [IU] | Freq: Every day | SUBCUTANEOUS | Status: DC
  Administered 2015-09-01: 12 [IU] via SUBCUTANEOUS
  Filled 2015-09-01 (×2): qty 0.12

## 2015-09-01 MED ORDER — LORAZEPAM 0.5 MG PO TABS
0.5000 mg | ORAL_TABLET | Freq: Two times a day (BID) | ORAL | Status: DC
  Administered 2015-09-01 – 2015-09-02 (×3): 0.5 mg via ORAL
  Filled 2015-09-01 (×3): qty 1

## 2015-09-01 MED ORDER — TRAZODONE HCL 100 MG PO TABS
100.0000 mg | ORAL_TABLET | Freq: Every day | ORAL | Status: DC
  Administered 2015-09-01 – 2015-09-08 (×7): 100 mg via ORAL
  Filled 2015-09-01: qty 1
  Filled 2015-09-01 (×2): qty 2
  Filled 2015-09-01 (×2): qty 1
  Filled 2015-09-01: qty 2
  Filled 2015-09-01: qty 1

## 2015-09-01 MED ORDER — MAGNESIUM OXIDE 400 (241.3 MG) MG PO TABS
400.0000 mg | ORAL_TABLET | Freq: Two times a day (BID) | ORAL | Status: AC
  Administered 2015-09-01 (×2): 400 mg via ORAL
  Filled 2015-09-01 (×2): qty 1

## 2015-09-01 MED ORDER — PAROXETINE HCL 20 MG PO TABS
20.0000 mg | ORAL_TABLET | Freq: Every day | ORAL | Status: DC
  Filled 2015-09-01: qty 1

## 2015-09-01 NOTE — Progress Notes (Signed)
Patient c/o back pain radiating to left hip and leg pain. Oxycodone given at 2300 with slight confusion from the medication. Rogue Bussing, NP switched Flexeril to Robaxin to try for releif, however patient decided not to take it from fear of confusion. "I'm meeting with my family today and the physican and I don't want to be confused." Heat packs applied to left hip and patient moving it around from spot to spot. Waiting for K Pad to arrive to floor. Will continue to monitor.

## 2015-09-01 NOTE — Consult Note (Signed)
[facility] Consult   Reason for Consult: Threatening to harm self Referring Physician:  Dr. Clydene Laming Patient Identification: Jacob [name] MRN:  [id number] Principal Diagnosis: Adjustment disorder with mixed emotional features Diagnosis:   Patient Active Problem List   Diagnosis Date Noted  . Adjustment disorder with mixed emotional features [F43.23] 09/01/2015    Priority: High  . Controlled diabetes mellitus type 2 with complications ([place]) [B34.1]   . Acute encephalopathy [G93.40] 08/29/2015  . Elevated troponin [R79.89] 08/29/2015  . Elevated LFTs [R79.89] 08/29/2015  . CAD in native artery [I25.10]   . Uncontrolled type 2 diabetes mellitus with complication (HCC) [P37.9, E11.65]   . Rheumatoid arthritis involving vertebra with positive rheumatoid factor (HCC) [M45.9]   . Spinal stenosis of lumbar region [M48.06]   . Renal cancer ([place]) [C64.9] 08/28/2015  . Spinal stenosis of lumbar region with radiculopathy [M48.06, M54.16] 08/07/2015  . Altered mental status [R41.82] 08/03/2015  . HCAP (healthcare-associated pneumonia) [J18.9] 08/03/2015  . History of COPD [Z87.09]   . Preoperative respiratory examination [Z01.811] 07/27/2015  . OSA (obstructive sleep apnea) [G47.33] 07/27/2015  . Renal insufficiency [N28.9] 07/27/2015  . HNP (herniated nucleus pulposus), lumbar [M51.26] 07/24/2015  . Right kidney mass [N28.89]   . CHF with left ventricular diastolic dysfunction, NYHA class 2 ([place]) [I50.30] 01/28/2014  . PMR (polymyalgia rheumatica) (HCC) [M35.3] 09/09/2013  . Rheumatoid arthritis ([place]) [M06.9] 04/13/2012  . CAD (coronary artery disease) [I25.10] 12/19/2011  . Diabetes mellitus, type 2 ([place]) [E11.9] 12/19/2011  . Neuropathy ([place]) [G62.9] 12/19/2011    Total Time spent with patient: 50 minutes  Subjective:   Jacob [name] is a 75 y.o. male patient admitted with altered mental status.  HPI:  Thanks for asking me to do a psychiatric consultation on Jacob [name],  75 y.o. man with medical history significant of lumbar stenosis, COPD, rheumatoid arthritis, hypertension, sleep apnea, CAD, DM 2, neuropathy, PMR who denies prior history of mental illness. Today, patient is alert and oriented to place, time , person and situation. He reports that he made the statement of threatening to harm himself yesterday out of being frustrated with his medical conditions. He reports recurrent back pain that has not responded to treatment. He endorsed being agitated, irritable and combative. However, he states that he is calm down now. He denies depressive symptoms, SI/HI, psychosis, delusional thinking but reports ongoing difficulty sleeping due to body pain.  Past Psychiatric History: denies  Risk to Self: Is patient at risk for suicide?: No Risk to Others:   Prior Inpatient Therapy:   Prior Outpatient Therapy:    Past Medical History:  Past Medical History  Diagnosis Date  . Allergy   . Asthma   . Diabetes mellitus without complication ([place])   . Cataract   . Arthritis   . Polymyalgia rheumatica (HCC)     maintained on Prednisone, Plaquenil. Followed by rhuematology every 4 months/Jacob [name].  Marland Kitchen COPD (chronic obstructive pulmonary disease) ([place])   . Anemia   . Hypertension   . BPH (benign prostatic hyperplasia)   . Sleep apnea     CPAP   Trying to use  . Shortness of breath dyspnea     with exertion  . Anxiety   . Chronic kidney disease     chronic  kidney failure  kidney function at 42%  . Coronary artery disease     CABG  7 bypasses  . Hepatitis     many years ago  . Cancer of  kidney [facility])     Past Surgical History  Procedure Laterality Date  . Appendectomy    . Hernia repair    . Prostate surgery      TURP at [facility].  Marland Kitchen Coronary artery bypass graft  03/17/1995    Jacob [name]; followed every six months.  . Cataract extraction w/phaco Right 11/28/2014    Procedure: CATARACT EXTRACTION PHACO AND INTRAOCULAR LENS PLACEMENT (IOC) RIGHT ;  Surgeon:  Jacob Pearson, MD;  Location: [facility];  Service: Ophthalmology;  Laterality: Right;  . Lumbar laminectomy/decompression microdiscectomy N/A 07/30/2015    Procedure: LUMBAR LAMINECTOMY DISCECTOMY ;  Surgeon: Jacob Pall, MD;  Location: [facility];  Service: Neurosurgery;  Laterality: N/A;  LUMBAR LAMINECTOMY DISCECTOMY    Family History:  Family History  Problem Relation Age of Onset  . Hypertension Other    Family Psychiatric  History:  Social History:  History  Alcohol Use No     History  Drug Use No    Social History   Social History  . Marital Status: Married    Spouse Name: N/A  . Number of Children: N/A  . Years of Education: N/A   Occupational History  . Driver    Social History Main Topics  . Smoking status: Former Smoker    Quit date: 03/16/1957  . Smokeless tobacco: Never Used  . Alcohol Use: No  . Drug Use: No  . Sexual Activity: Yes    Birth Control/ Protection: None   Other Topics Concern  . None   Social History Narrative   Marital status: Married x 55 years.      Children: 3 children; 5 grandchildren; 6 gg.      Lives: with wife.        Employment: retired; [employer].  Working every other week; 45 hours.      Tobacco:  Quit at age 77.      Alcohol:  None      Exercise: none      ADLs: indepedent with ADLs;  Uses cane and has a walker.  Drives.      Advanced Directives:  +Living Will.  DNR/DNI.  Wife is HCPOA.   Additional Social History:    Allergies:   Allergies  Allergen Reactions  . Ace Inhibitors Other (See Comments)    Probably nausea and vomiting per patient   . Actonel [Risedronate] Nausea And Vomiting  . Ciprocinonide [Fluocinolone] Other (See Comments)    Probably nausea and vomiting per patient  . Flunisolide Other (See Comments)    Probably nausea and vomiting per patient   . Metformin And Related Other (See Comments)    Probably nausea and vomiting per patient   . Sertraline Other (See Comments)     Probably nausea and vomiting per patient   . Sulindac Other (See Comments)    Probably nausea and vomiting per patient   . Terazosin Other (See Comments)    Probably nausea and vomiting per patient     Labs:  Results for orders placed or performed during the hospital encounter of 08/28/15 (from the past 48 hour(s))  Culture, blood (routine x 2)     Status: None (Preliminary result)   Collection Time: 08/30/15  1:30 PM  Result Value Ref Range   Specimen Description BLOOD RIGHT ANTECUBITAL    Special Requests IN PEDIATRIC BOTTLE 3CC    Culture NO GROWTH 1 DAY    Report Status PENDING   Glucose, capillary     Status:  Abnormal   Collection Time: 08/30/15  3:41 PM  Result Value Ref Range   Glucose-Capillary 194 (H) 65 - 99 mg/dL   Comment 1 Capillary Specimen   Body fluid culture     Status: None (Preliminary result)   Collection Time: 08/30/15  5:17 PM  Result Value Ref Range   Specimen Description FLUID    Special Requests INTERVERTEBRAL DISC    Gram Stain      RARE WBC PRESENT,BOTH PMN AND MONONUCLEAR NO ORGANISMS SEEN    Culture NO GROWTH 2 DAYS    Report Status PENDING   Glucose, capillary     Status: Abnormal   Collection Time: 08/30/15  8:20 PM  Result Value Ref Range   Glucose-Capillary 251 (H) 65 - 99 mg/dL  Glucose, capillary     Status: Abnormal   Collection Time: 08/30/15 11:55 PM  Result Value Ref Range   Glucose-Capillary 227 (H) 65 - 99 mg/dL  Comprehensive metabolic panel     Status: Abnormal   Collection Time: 08/31/15  3:15 AM  Result Value Ref Range   Sodium 141 135 - 145 mmol/L   Potassium 3.5 3.5 - 5.1 mmol/L   Chloride 109 101 - 111 mmol/L   CO2 27 22 - 32 mmol/L   Glucose, Bld 163 (H) 65 - 99 mg/dL   BUN 16 6 - 20 mg/dL   Creatinine, Ser 1.29 (H) 0.61 - 1.24 mg/dL   Calcium 9.0 8.9 - 10.3 mg/dL   Total Protein 5.5 (L) 6.5 - 8.1 g/dL   Albumin 2.4 (L) 3.5 - 5.0 g/dL   AST 27 15 - 41 U/L   ALT 44 17 - 63 U/L   Alkaline Phosphatase 341 (H) 38 -  126 U/L   Total Bilirubin 0.6 0.3 - 1.2 mg/dL   GFR calc non Af Amer 53 (L) >60 mL/min   GFR calc Af Amer >60 >60 mL/min    Comment: (NOTE) The eGFR has been calculated using the CKD EPI equation. This calculation has not been validated in all clinical situations. eGFR's persistently <60 mL/min signify possible Chronic Kidney Disease.    Anion gap 5 5 - 15  CBC     Status: Abnormal   Collection Time: 08/31/15  3:15 AM  Result Value Ref Range   WBC 8.8 4.0 - 10.5 K/uL   RBC 3.43 (L) 4.22 - 5.81 MIL/uL   Hemoglobin 10.2 (L) 13.0 - 17.0 g/dL   HCT 32.1 (L) 39.0 - 52.0 %   MCV 93.6 78.0 - 100.0 fL   MCH 29.7 26.0 - 34.0 pg   MCHC 31.8 30.0 - 36.0 g/dL   RDW 14.6 11.5 - 15.5 %   Platelets 244 150 - 400 K/uL  Magnesium     Status: None   Collection Time: 08/31/15  3:15 AM  Result Value Ref Range   Magnesium 1.7 1.7 - 2.4 mg/dL  Glucose, capillary     Status: Abnormal   Collection Time: 08/31/15  4:19 AM  Result Value Ref Range   Glucose-Capillary 145 (H) 65 - 99 mg/dL   Comment 1 Notify RN   Glucose, capillary     Status: Abnormal   Collection Time: 08/31/15  8:23 AM  Result Value Ref Range   Glucose-Capillary 104 (H) 65 - 99 mg/dL   Comment 1 Notify RN   Glucose, capillary     Status: Abnormal   Collection Time: 08/31/15 11:49 AM  Result Value Ref Range   Glucose-Capillary 115 (H) 65 -  99 mg/dL   Comment 1 Notify RN   Glucose, capillary     Status: Abnormal   Collection Time: 08/31/15  4:21 PM  Result Value Ref Range   Glucose-Capillary 196 (H) 65 - 99 mg/dL   Comment 1 Notify RN   Glucose, capillary     Status: Abnormal   Collection Time: 08/31/15  7:37 PM  Result Value Ref Range   Glucose-Capillary 359 (H) 65 - 99 mg/dL  Glucose, capillary     Status: Abnormal   Collection Time: 08/31/15 11:25 PM  Result Value Ref Range   Glucose-Capillary 162 (H) 65 - 99 mg/dL  Glucose, capillary     Status: Abnormal   Collection Time: 09/01/15  3:17 AM  Result Value Ref Range    Glucose-Capillary 45 (L) 65 - 99 mg/dL  Glucose, capillary     Status: Abnormal   Collection Time: 09/01/15  3:40 AM  Result Value Ref Range   Glucose-Capillary 50 (L) 65 - 99 mg/dL  Comprehensive metabolic panel     Status: Abnormal   Collection Time: 09/01/15  4:12 AM  Result Value Ref Range   Sodium 140 135 - 145 mmol/L   Potassium 2.8 (L) 3.5 - 5.1 mmol/L    Comment: DELTA CHECK NOTED   Chloride 104 101 - 111 mmol/L   CO2 28 22 - 32 mmol/L   Glucose, Bld 60 (L) 65 - 99 mg/dL   BUN 10 6 - 20 mg/dL   Creatinine, Ser 1.14 0.61 - 1.24 mg/dL   Calcium 9.2 8.9 - 10.3 mg/dL   Total Protein 5.8 (L) 6.5 - 8.1 g/dL   Albumin 2.7 (L) 3.5 - 5.0 g/dL   AST 29 15 - 41 U/L   ALT 45 17 - 63 U/L   Alkaline Phosphatase 355 (H) 38 - 126 U/L   Total Bilirubin 0.7 0.3 - 1.2 mg/dL   GFR calc non Af Amer >60 >60 mL/min   GFR calc Af Amer >60 >60 mL/min    Comment: (NOTE) The eGFR has been calculated using the CKD EPI equation. This calculation has not been validated in all clinical situations. eGFR's persistently <60 mL/min signify possible Chronic Kidney Disease.    Anion gap 8 5 - 15  CBC     Status: Abnormal   Collection Time: 09/01/15  4:12 AM  Result Value Ref Range   WBC 6.6 4.0 - 10.5 K/uL   RBC 3.65 (L) 4.22 - 5.81 MIL/uL   Hemoglobin 10.6 (L) 13.0 - 17.0 g/dL   HCT 34.0 (L) 39.0 - 52.0 %   MCV 93.2 78.0 - 100.0 fL   MCH 29.0 26.0 - 34.0 pg   MCHC 31.2 30.0 - 36.0 g/dL   RDW 14.7 11.5 - 15.5 %   Platelets 250 150 - 400 K/uL  Magnesium     Status: Abnormal   Collection Time: 09/01/15  4:12 AM  Result Value Ref Range   Magnesium 1.6 (L) 1.7 - 2.4 mg/dL  Glucose, capillary     Status: Abnormal   Collection Time: 09/01/15  4:12 AM  Result Value Ref Range   Glucose-Capillary 64 (L) 65 - 99 mg/dL  Glucose, capillary     Status: None   Collection Time: 09/01/15  5:52 AM  Result Value Ref Range   Glucose-Capillary 98 65 - 99 mg/dL  Glucose, capillary     Status: Abnormal    Collection Time: 09/01/15  8:01 AM  Result Value Ref Range   Glucose-Capillary 121 (H)  65 - 99 mg/dL   Comment 1 Capillary Specimen   Glucose, capillary     Status: Abnormal   Collection Time: 09/01/15 11:46 AM  Result Value Ref Range   Glucose-Capillary 134 (H) 65 - 99 mg/dL   Comment 1 Capillary Specimen     Current Facility-Administered Medications  Medication Dose Route Frequency Provider Last Rate Last Dose  . 0.9 %  sodium chloride infusion   Intravenous Continuous Allie Bossier, MD 50 mL/hr at 09/01/15 1200    . acetaminophen (TYLENOL) tablet 650 mg  650 mg Oral Q6H PRN Toy Baker, MD   650 mg at 09/01/15 1043   Or  . acetaminophen (TYLENOL) suppository 650 mg  650 mg Rectal Q6H PRN Toy Baker, MD      . allopurinol (ZYLOPRIM) tablet 300 mg  300 mg Oral Q lunch Toy Baker, MD   300 mg at 09/01/15 1230  . amLODipine (NORVASC) tablet 10 mg  10 mg Oral Q supper Toy Baker, MD   10 mg at 08/31/15 1633  . antiseptic oral rinse (CPC / CETYLPYRIDINIUM CHLORIDE 0.05%) solution 7 mL  7 mL Mouth Rinse BID Etta Quill, DO   7 mL at 08/31/15 1000  . aspirin EC tablet 81 mg  81 mg Oral Q lunch Toy Baker, MD   81 mg at 09/01/15 1230  . atorvastatin (LIPITOR) tablet 20 mg  20 mg Oral q1800 Allie Bossier, MD   20 mg at 08/31/15 1633  . calcitRIOL (ROCALTROL) capsule 0.25 mcg  0.25 mcg Oral Q M,W,F Toy Baker, MD   0.25 mcg at 08/30/15 1156  . cefTRIAXone (ROCEPHIN) 2 g in dextrose 5 % 50 mL IVPB  2 g Intravenous Q24H Allie Bossier, MD   2 g at 08/31/15 1633  . cyclobenzaprine (FLEXERIL) tablet 5 mg  5 mg Oral TID Allie Bossier, MD   5 mg at 09/01/15 1108  . cycloSPORINE (RESTASIS) 0.05 % ophthalmic emulsion 1 drop  1 drop Both Eyes BID Toy Baker, MD   1 drop at 09/01/15 1053  . feeding supplement (GLUCERNA SHAKE) (GLUCERNA SHAKE) liquid 237 mL  237 mL Oral TID BM Allie Bossier, MD   237 mL at 09/01/15 1038  . finasteride (PROSCAR)  tablet 5 mg  5 mg Oral QHS Toy Baker, MD   5 mg at 08/31/15 2146  . insulin aspart (novoLOG) injection 0-20 Units  0-20 Units Subcutaneous Q4H Allie Bossier, MD   3 Units at 09/01/15 1225  . insulin glargine (LANTUS) injection 15 Units  15 Units Subcutaneous QHS Allie Bossier, MD   15 Units at 08/31/15 2147  . ipratropium-albuterol (DUONEB) 0.5-2.5 (3) MG/3ML nebulizer solution 3 mL  3 mL Nebulization TID Toy Baker, MD   3 mL at 09/01/15 0827  . labetalol (NORMODYNE,TRANDATE) injection 10 mg  10 mg Intravenous Q2H PRN Toy Baker, MD   10 mg at 08/29/15 0504  . Liraglutide SOPN 1.2 mg  1.2 mg Subcutaneous Q1500 Allie Bossier, MD   1.2 mg at 08/31/15 1500  . LORazepam (ATIVAN) tablet 0.5 mg  0.5 mg Oral BID Allie Bossier, MD   0.5 mg at 09/01/15 1108  . magnesium oxide (MAG-OX) tablet 400 mg  400 mg Oral BID Allie Bossier, MD   400 mg at 09/01/15 1043  . megestrol (MEGACE) 400 MG/10ML suspension 800 mg  800 mg Oral Daily Allie Bossier, MD      . metoprolol (LOPRESSOR) tablet  50 mg  50 mg Oral BID Toy Baker, MD   50 mg at 09/01/15 1042  . mometasone-formoterol (DULERA) 200-5 MCG/ACT inhaler 2 puff  2 puff Inhalation BID Toy Baker, MD   2 puff at 09/01/15 0826  . montelukast (SINGULAIR) tablet 10 mg  10 mg Oral QHS Toy Baker, MD   10 mg at 08/31/15 2146  . ondansetron (ZOFRAN) tablet 4 mg  4 mg Oral Q6H PRN Toy Baker, MD   4 mg at 09/01/15 1044   Or  . ondansetron (ZOFRAN) injection 4 mg  4 mg Intravenous Q6H PRN Toy Baker, MD   4 mg at 09/01/15 0540  . oxyCODONE (Oxy IR/ROXICODONE) immediate release tablet 5 mg  5 mg Oral Q6H PRN Allie Bossier, MD   5 mg at 09/01/15 1042  . pilocarpine (PILOCAR) 2 % ophthalmic solution 1 drop  1 drop Both Eyes TID Toy Baker, MD   1 drop at 09/01/15 1050  . polyethylene glycol (MIRALAX / GLYCOLAX) packet 17 g  17 g Oral BID Toy Baker, MD   17 g at 09/01/15 1048  . predniSONE  (DELTASONE) tablet 7 mg  7 mg Oral Q breakfast Allie Bossier, MD   7 mg at 09/01/15 0856  . senna-docusate (Senokot-S) tablet 2 tablet  2 tablet Oral BID Toy Baker, MD   2 tablet at 09/01/15 1042  . sodium chloride flush (NS) 0.9 % injection 10-40 mL  10-40 mL Intracatheter Q12H Allie Bossier, MD   10 mL at 09/01/15 1046  . sodium chloride flush (NS) 0.9 % injection 10-40 mL  10-40 mL Intracatheter PRN Allie Bossier, MD      . sodium chloride flush (NS) 0.9 % injection 3 mL  3 mL Intravenous Q12H Toy Baker, MD   3 mL at 09/01/15 1046  . tamsulosin (FLOMAX) capsule 0.4 mg  0.4 mg Oral QHS Toy Baker, MD   0.4 mg at 08/31/15 2146  . traZODone (DESYREL) tablet 100 mg  100 mg Oral QHS Jhett Fretwell, MD      . vancomycin (VANCOCIN) IVPB 750 mg/150 ml premix  750 mg Intravenous Q12H Allie Bossier, MD   750 mg at 09/01/15 0403    Musculoskeletal: Strength & Muscle Tone: within normal limits Gait & Station: not tested Patient leans: N/A  Psychiatric Specialty Exam: Physical Exam  Psychiatric: His speech is normal. Judgment and thought content normal. His mood appears anxious. He is agitated, aggressive and combative. Cognition and memory are normal.    Review of Systems  Constitutional: Positive for malaise/fatigue.  HENT: Negative.   Eyes: Negative.   Cardiovascular: Negative.   Gastrointestinal: Negative.   Genitourinary: Negative.   Musculoskeletal: Positive for myalgias and back pain.  Skin: Negative.   Psychiatric/Behavioral: The patient is nervous/anxious and has insomnia.     Blood pressure 146/85, pulse 94, temperature 97.8 F (36.6 C), temperature source Oral, resp. rate 15, height 5' 11"  (1.803 m), weight 97.7 kg (215 lb 6.2 oz), SpO2 95 %.Body mass index is 30.05 kg/(m^2).  General Appearance: Casual  Eye Contact:  Good  Speech:  Clear and Coherent  Volume:  Normal  Mood:  Euthymic  Affect:  Appropriate  Thought Process:  Coherent and  Descriptions of Associations: Intact  Orientation:  Full (Time, Place, and Person)  Thought Content:  Negative  Suicidal Thoughts:  No  Homicidal Thoughts:  No  Memory:  Immediate;   Fair Recent;   Good Remote;   Good  Judgement:  Fair  Insight:  Fair  Psychomotor Activity:  Normal  Concentration:  Concentration: Fair and Attention Span: Fair  Recall:  Good  Fund of Knowledge:  Good  Language:  Good  Akathisia:  No  Handed:  Right  AIMS (if indicated):     Assets:  Communication Skills Desire for Improvement  ADL's:  Intact  Cognition:  WNL  Sleep:   poor     Treatment Plan Summary: Plan/Recommendations: -Discontinue Paxil -Start Trazodone 176m qhs for sleep/aggression/depression -Re-consult psych consult service if necessary in the future.  Disposition: No evidence of imminent risk to self or others at present.   Patient does not meet criteria for psychiatric inpatient admission.  ACorena Pilgrim MD 09/01/2015 1:29 PM

## 2015-09-01 NOTE — Progress Notes (Signed)
PROGRESS NOTE    Jacob [name]  [id number] DOB: [birth year] DOA: 08/28/2015 PCP: [name], MD   Brief Narrative:  75 y.o. WM PMHx Anxiety, Lumbar stenosis S/P laminectomy and Discectomy May 2017 by Dr Ashok Pall., COPD, RA, HTN, OSA, CAD native artery S/P CABG 7 vessel, DM Type 2 uncontrolled with complication, DM neuropathy, PMR, CKD, Cancer of kidney, Hepatitis   Presented with worsening encephalopathy for the past 24 hours has been doing worse overall. He was discharged to home on June 8th. On 10th of June he started to get progressively worse. He was seen in office by Dr. Christella Noa and was doing better. But started to threaten family he had psych eval at [facility] and was told to go to PCP. Today they went to ER at [facility] care. He was seen in [place] ED. patient had extensive workup chest x-ray was unremarkable UA was concentrated but no evidence of UTI. CT scan of the head was non-acute. U tox positive for benzos and opioids. Troponin was slightly elevated but no EKG changes suggestive of ischemia. They were planing to do MRI to see what is going on he was given something for anxiety and have been lethargic since. where he have gone imaging of his back which was inconclusive for infection versus surgical changes but one does not appear to be infected. He was noted to have elevated LFTs ultrasound was done showing no evidence of cholecystitis. Creatinine was found to be 1.3 which is above his baseline. Chest x-ray while in the facility was unremarkable. Patient was accepted in transfer to stepdown to [facility] Family reports that he has not been eating and drinking for past 3-4 days. He has not taken much of his home medications he did have a dose of Percocet this morning and Flexeril.   Regarding pertinent Chronic problems: In May patient had a fall in the bathtub MRI of the back show large herniated nucleus pulposus at L3-4 with severe lumbar stenosis. He has undergone lumbar  laminectomy discectomy at the level L3/4 by Dr. Christella Noa In May this was complicated by pneumonia acute renal failure she was discharged to nursing home [facility] and 26th of May Blood pressure at baseline controlled with amlodipine and Lopressor While in the nursing home he finished Augmentin for his age Pneumonia She has history of polymyalgia rheumatica for which she takes prednisone 7 mg daily Last echogram done in May 2017 showing preserved EF no evidence of diastolic dysfunction   Assessment & Plan:   Active Problems:   CAD (coronary artery disease)   Diabetes mellitus, type 2 (HCC)   Neuropathy (HCC)   Rheumatoid arthritis ([place])   CHF with left ventricular diastolic dysfunction, NYHA class 2 (HCC)   OSA (obstructive sleep apnea)   Altered mental status   History of COPD   Spinal stenosis of lumbar region with radiculopathy   Renal cancer (HCC)   Acute encephalopathy   Elevated troponin   Elevated LFTs   CAD in native artery   Uncontrolled type 2 diabetes mellitus with complication (HCC)   Rheumatoid arthritis involving vertebra with positive rheumatoid factor ([place])   Spinal stenosis of lumbar region   Controlled diabetes mellitus type 2 with complications ([place])   Acute encephalopathy  - the setting of dehydration and polypharmacy.  -Resolved -Care when increasing narcotics and other sedating medication  -Normal saline 25ml/hrs   Depression with threatening violence against family and self -Most likely secondary to intractable back pain and recent surgery. Per family  very depressed about current condition especially with wife also being ill. -6/18 consulted psychiatry; pending -Will await psychiatry recommendations  Anxiety -Start Ativan 0.5 mg BID -Start Paxil 20 mg daily  CAD (coronary artery disease)native artery  -Metoprolol 50 mg BID -Labetalol IV PRN   CHF with left ventricular diastolic dysfunction, NYHA class 2  - last echogram was done in May 2017  show no evidence of CHF. -repeat Echocardiogram; essentially normal see results below   -strict in and out since admission + 2.0 L -Daily weight Filed Weights   08/29/15 1621 08/31/15 0500 09/01/15 0400  Weight: 97.8 kg (215 lb 9.8 oz) 95.9 kg (211 lb 6.7 oz) 97.7 kg (215 lb 6.2 oz)   OSA (obstructive sleep apnea)  -patient states has CPAP machine at home but unable to use secondary to claustrophobia. -Titrate O2 to maintain SPO2> 93%   Rheumatoid arthritis  -prednisone 7 mg daily  Spinal stenosis of lumbar region with radiculopathy status post laminectomy  -questionable changes on MRI. Infection vs expected post surgical changes? Will await neurosurgery input. -patient stated had extreme pain at home currently asymptomatic -6/16 spoke with Dr.Benjamin [facility] neurosurgery. After reviewing case he also concerned that could be early infection. He Spoke with family offered to perform surgery to ensure no infection, however patient and family declined. Did agree if necessary would allow needle aspiration of fluid. -Requested IR aspirate fluid for culture -spoke with Dr. Carlyle Basques ID concurs with plan recommend starting patient on ceftriaxone and vancomycin once aspirate obtained -Per ID 6 weeks antibiotics. PICC line placed -Air mattress ensure on rotation at all times -6/18 following family conference requests that Dr Ashok Pall.[facility], speak with them concerning timing possible future surgery. Contact Dr Ashok Pall. Monday 6/19 after 0 900  Renal cancer  -S/P Ablation, stable   CKD (baseline Cr~1.49-2.0) Lab Results  Component Value Date   CREATININE 1.14 09/01/2015   CREATININE 1.29* 08/31/2015   CREATININE 1.35* 08/30/2015  -At baseline  Elevated troponin  -asymptomatic most likely secondary to demand ischemia.   Diabetes mellitus type 2 controlled with complications -[id number] hemoglobin A1c= 6.8 -Decrease Lantus12 units daily -Resistant  SSI  Elevated LFTs - outside hospital. Obtained ultrasound that showed no evidence of gallstone disease or cholecystitis possible dehydration related  Hypokalemia -Potassium goal> 4 -Potassium IV 40 mEq  Hypomagnesemia -Magnesium goal> 2 -Magnesium oxide 400 mg 2  Pain management  -OxyIR 5 mg QID PRN -Flexeril 5 mg TID PRN  Anorexia -Megace 800 mg daily   Goals of care -Patient has requested that we have a family meeting to discuss short-term/long-term goals of care on Sunday 6/18 at 0900     DVT prophylaxis: SCD Code Status: Full Family Communication; none Disposition Plan: resolution of acute encephalopathy//increased  L-spine pain    Consultants:  Dr. Kevan Ny [facility] Neurosurgery   Dr. Carlyle Basques ID   Procedures/Significant Events:  5/16 LUMBAR LAMINECTOMY DISCECTOMY L3/4 bilateral 6/15 MRI brain;negative acute CVA-mild to moderate  Chronic small vessel ischemic disease 6/15 L spine W/WO contrast;-Postoperative changes at the L3-L4 level with superimposed large cephalad disc extrusion causing severe spinal stenosis, as well as abnormal fluid signal in the L3-L4 disc space associated with mild L4 superior endplate fracture with L3 and L4 endplate edema/inflammation.  -Mild associated psoas muscle inflammation. -Discitis/ osteomyelitis at L3-L4?? -Small postoperative fluid collection overlying the L3 spinous process, favor seroma. 6/15 echocardiogram;Left ventricle: mildly dilated.-LVEF= 55% - 60%.- (grade 1 diastolic dysfunction).  Cultures 6/16 blood right AC 2 NGTD pending  6/16 L-spine aspirated fluid NGTD    Antimicrobials: Ceftriaxone 6/16>> Vancomycin 6/16>>   Devices    LINES / TUBES:  PIC midline 6/17>>    Continuous Infusions: . sodium chloride 1,000 mL (08/31/15 1854)     Subjective: 6/18 A/O 4, after speaking with family at this morning's goal of care meeting Patient changed mind on neurosurgery  vs long-term  antibiotic. Would like to see neurosurgeon again to discuss options.      Objective: Filed Vitals:   09/01/15 0800 09/01/15 0802 09/01/15 0826 09/01/15 0827  BP: 164/87     Pulse: 96 97    Temp:  97.7 F (36.5 C)    TempSrc:  Oral    Resp: 19 18    Height:      Weight:      SpO2: 96% 99% 98% 99%    Intake/Output Summary (Last 24 hours) at 09/01/15 0902 Last data filed at 09/01/15 0600  Gross per 24 hour  Intake    600 ml  Output   2400 ml  Net  -1800 ml   Filed Weights   08/29/15 1621 08/31/15 0500 09/01/15 0400  Weight: 97.8 kg (215 lb 9.8 oz) 95.9 kg (211 lb 6.7 oz) 97.7 kg (215 lb 6.2 oz)    Examination:  General:A/O 4, follows all commands, No acute respiratory distress Eyes: negative scleral hemorrhage, negative anisocoria, negative icterus ENT: Negative Runny nose, negative gingival bleeding, Neck:  Negative scars, masses, torticollis, lymphadenopathy, JVD Lungs: Clear to auscultation bilaterally without wheezes or crackles Cardiovascular: Regular rate and rhythm without murmur gallop or rub normal S1 and S2 Abdomen: negative abdominal pain, nondistended, positive soft, bowel sounds, no rebound, no ascites, no appreciable mass Extremities: No significant cyanosis, clubbing, or edema bilateral lower extremities Skin: well-healed incision medial aspect of LLE Psychiatric:  Negative depression, negative anxiety, negative fatigue, negative mania  Central nervous system:  Cranial nerves II through XII intact, tongue/uvula midline, all extremities muscle strength 5/5, sensation intact throughout,negative dysarthria, negative expressive aphasia, negative receptive aphasia.      Data Reviewed: Care during the described time interval was provided by me .  I have reviewed this patient's available data, including medical history, events of note, physical examination, and all test results as part of my evaluation. I have personally reviewed and interpreted all radiology  studies.  CBC:  Recent Labs Lab 08/28/15 2312 08/29/15 1034 08/30/15 0310 08/31/15 0315 09/01/15 0412  WBC 6.4 6.3 6.0 8.8 6.6  NEUTROABS 3.8  --   --   --   --   HGB 11.8* 11.3* 10.9* 10.2* 10.6*  HCT 37.5* 36.2* 34.8* 32.1* 34.0*  MCV 95.2 95.5 93.3 93.6 93.2  PLT 254 250 248 244 [id number]   Basic Metabolic Panel:  Recent Labs Lab 08/28/15 2312 08/29/15 1034 08/30/15 0310 08/31/15 0315 09/01/15 0412  NA 138 140 140 141 140  K 3.5 3.5 3.5 3.5 2.8*  CL 103 106 106 109 104  CO2 24 25 26 27 28   GLUCOSE 237* 184* 228* 163* 60*  BUN 13 12 16 16 10   CREATININE 1.61* 1.44* 1.35* 1.29* 1.14  CALCIUM 9.2 9.0 9.1 9.0 9.2  MG  --  1.6* 1.7 1.7 1.6*  PHOS  --  3.8  --   --   --    GFR: Estimated Creatinine Clearance: 66.8 mL/min (by C-G formula based on Cr of 1.14). Liver Function Tests:  Recent Labs Lab 08/28/15 2312  08/29/15 1034 08/30/15 0310 08/31/15 0315 09/01/15 0412  AST 39 31 25 27 29   ALT 72* 61 53 44 45  ALKPHOS 461* 407* 384* 341* 355*  BILITOT 1.4* 1.2 0.8 0.6 0.7  PROT 6.3* 5.9* 5.8* 5.5* 5.8*  ALBUMIN 2.8* 2.6* 2.4* 2.4* 2.7*   No results for input(s): LIPASE, AMYLASE in the last 168 hours.  Recent Labs Lab 08/28/15 2313  AMMONIA 28   Coagulation Profile:  Recent Labs Lab 08/30/15 1317  INR 1.02   Cardiac Enzymes:  Recent Labs Lab 08/28/15 2312 08/29/15 1034 08/30/15 0310  TROPONINI 0.05* 0.04*  0.04* 0.03   BNP (last 3 results) No results for input(s): PROBNP in the last 8760 hours. HbA1C:  Recent Labs  08/29/15 1034  HGBA1C 6.8*   CBG:  Recent Labs Lab 09/01/15 0317 09/01/15 0340 09/01/15 0412 09/01/15 0552 09/01/15 0801  GLUCAP 45* 50* 64* 98 121*   Lipid Profile: No results for input(s): CHOL, HDL, LDLCALC, TRIG, CHOLHDL, LDLDIRECT in the last 72 hours. Thyroid Function Tests:  Recent Labs  08/29/15 1034  TSH 1.709   Anemia Panel: No results for input(s): VITAMINB12, FOLATE, FERRITIN, TIBC, IRON, RETICCTPCT  in the last 72 hours. Urine analysis:    Component Value Date/Time   BILIRUBINUR neg 09/09/2013 1233   PROTEINUR >=300 09/09/2013 1233   UROBILINOGEN 0.2 09/09/2013 1233   NITRITE neg 09/09/2013 1233   LEUKOCYTESUR Negative 09/09/2013 1233   Sepsis Labs: @LABRCNTIP (procalcitonin:4,lacticidven:4)  ) Recent Results (from the past 240 hour(s))  MRSA PCR Screening     Status: None   Collection Time: 08/29/15  2:20 PM  Result Value Ref Range Status   MRSA by PCR NEGATIVE NEGATIVE Final    Comment:        The GeneXpert MRSA Assay (FDA approved for NASAL specimens only), is one component of a comprehensive MRSA colonization surveillance program. It is not intended to diagnose MRSA infection nor to guide or monitor treatment for MRSA infections.   Culture, blood (routine x 2)     Status: None (Preliminary result)   Collection Time: 08/30/15  1:25 PM  Result Value Ref Range Status   Specimen Description BLOOD RIGHT ANTECUBITAL  Final   Special Requests IN PEDIATRIC BOTTLE 2CC  Final   Culture NO GROWTH 1 DAY  Final   Report Status PENDING  Incomplete  Culture, blood (routine x 2)     Status: None (Preliminary result)   Collection Time: 08/30/15  1:30 PM  Result Value Ref Range Status   Specimen Description BLOOD RIGHT ANTECUBITAL  Final   Special Requests IN PEDIATRIC BOTTLE 3CC  Final   Culture NO GROWTH 1 DAY  Final   Report Status PENDING  Incomplete  Body fluid culture     Status: None (Preliminary result)   Collection Time: 08/30/15  5:17 PM  Result Value Ref Range Status   Specimen Description FLUID  Final   Special Requests INTERVERTEBRAL DISC  Final   Gram Stain   Final    RARE WBC PRESENT,BOTH PMN AND MONONUCLEAR NO ORGANISMS SEEN    Culture NO GROWTH < 24 HOURS  Final   Report Status PENDING  Incomplete         Radiology Studies: No results found.      Scheduled Meds: . allopurinol  300 mg Oral Q lunch  . amLODipine  10 mg Oral Q supper  .  antiseptic oral rinse  7 mL Mouth Rinse BID  . aspirin EC  81 mg  Oral Q lunch  . atorvastatin  20 mg Oral q1800  . calcitRIOL  0.25 mcg Oral Q M,W,F  . cefTRIAXone (ROCEPHIN)  IV  2 g Intravenous Q24H  . cycloSPORINE  1 drop Both Eyes BID  . feeding supplement (GLUCERNA SHAKE)  237 mL Oral TID BM  . finasteride  5 mg Oral QHS  . insulin aspart  0-20 Units Subcutaneous Q4H  . insulin glargine  15 Units Subcutaneous QHS  . ipratropium-albuterol  3 mL Nebulization TID  . Liraglutide  1.2 mg Subcutaneous Q1500  . metoprolol  50 mg Oral BID  . mometasone-formoterol  2 puff Inhalation BID  . montelukast  10 mg Oral QHS  . pilocarpine  1 drop Both Eyes TID  . polyethylene glycol  17 g Oral BID  . potassium chloride  10 mEq Intravenous Q1 Hr x 4  . predniSONE  7 mg Oral Q breakfast  . senna-docusate  2 tablet Oral BID  . sodium chloride flush  10-40 mL Intracatheter Q12H  . sodium chloride flush  3 mL Intravenous Q12H  . tamsulosin  0.4 mg Oral QHS  . vancomycin  750 mg Intravenous Q12H   Continuous Infusions: . sodium chloride 1,000 mL (08/31/15 1854)     LOS: 4 days    Time spent: 40 minutes    WOODS, Geraldo Docker, MD [facility] Pager [phone]   If 7PM-7AM, please contact night-coverage [email or web address] Password [email or web address] 09/01/2015, 9:02 AM

## 2015-09-01 NOTE — Progress Notes (Signed)
Patient ID: Jacob [name], male   DOB: [birth year], 75 y.o.   MRN: [name] Subjective:  The patient is alert and pleasant. He is in no apparent distress.  Objective: Vital signs in last 24 hours: Temp:  [97.5 F (36.4 C)-98 F (36.7 C)] 97.7 F (36.5 C) (06/18 0802) Pulse Rate:  [92-105] 104 (06/18 0900) Resp:  [13-19] 17 (06/18 0900) BP: (138-164)/(78-87) 164/87 mmHg (06/18 0800) SpO2:  [86 %-100 %] 98 % (06/18 0900) Weight:  [97.7 kg (215 lb 6.2 oz)] 97.7 kg (215 lb 6.2 oz) (06/18 0400)  Intake/Output from previous day: 06/17 0701 - 06/18 0700 In: 600 [I.V.:250; IV Piggyback:350] Out: 2400 [Urine:2400] Intake/Output this shift: Total I/O In: 3 [I.V.:3] Out: -   Physical exam the patient is moving his lower extremities well. He is alert and oriented.  Lab Results:  Recent Labs  08/31/15 0315 09/01/15 0412  WBC 8.8 6.6  HGB 10.2* 10.6*  HCT 32.1* 34.0*  PLT 244 250   BMET  Recent Labs  08/31/15 0315 09/01/15 0412  NA 141 140  K 3.5 2.8*  CL 109 104  CO2 27 28  GLUCOSE 163* 60*  BUN 16 10  CREATININE 1.29* 1.14  CALCIUM 9.0 9.2    Studies/Results: No results found.  Assessment/Plan: Lumbar discitis: We are awaiting the needle aspiration results. I have explained to the patient and his family, even if the cultures are negative he will likely be treated empirically with antibiotics. He will need a PICC line and home IV antibiotics arranged.  LOS: 4 days     Jacob [name] D 09/01/2015, 11:22 AM

## 2015-09-02 DIAGNOSIS — Z9889 Other specified postprocedural states: Secondary | ICD-10-CM | POA: Diagnosis not present

## 2015-09-02 DIAGNOSIS — E119 Type 2 diabetes mellitus without complications: Secondary | ICD-10-CM | POA: Diagnosis not present

## 2015-09-02 DIAGNOSIS — F4320 Adjustment disorder, unspecified: Secondary | ICD-10-CM

## 2015-09-02 DIAGNOSIS — M4646 Discitis, unspecified, lumbar region: Secondary | ICD-10-CM | POA: Diagnosis not present

## 2015-09-02 DIAGNOSIS — M79604 Pain in right leg: Secondary | ICD-10-CM | POA: Diagnosis not present

## 2015-09-02 DIAGNOSIS — F4323 Adjustment disorder with mixed anxiety and depressed mood: Secondary | ICD-10-CM | POA: Diagnosis not present

## 2015-09-02 DIAGNOSIS — M353 Polymyalgia rheumatica: Secondary | ICD-10-CM | POA: Diagnosis not present

## 2015-09-02 DIAGNOSIS — M79605 Pain in left leg: Secondary | ICD-10-CM | POA: Diagnosis not present

## 2015-09-02 LAB — COMPREHENSIVE METABOLIC PANEL
ALT: 51 U/L (ref 17–63)
AST: 38 U/L (ref 15–41)
Albumin: 2.8 g/dL — ABNORMAL LOW (ref 3.5–5.0)
Alkaline Phosphatase: 348 U/L — ABNORMAL HIGH (ref 38–126)
Anion gap: 7 (ref 5–15)
BUN: 8 mg/dL (ref 6–20)
CO2: 28 mmol/L (ref 22–32)
Calcium: 9.3 mg/dL (ref 8.9–10.3)
Chloride: 103 mmol/L (ref 101–111)
Creatinine, Ser: 1.31 mg/dL — ABNORMAL HIGH (ref 0.61–1.24)
GFR calc Af Amer: 60 mL/min — ABNORMAL LOW (ref >60)
GFR calc non Af Amer: 52 mL/min — ABNORMAL LOW (ref >60)
Glucose, Bld: 90 mg/dL (ref 65–99)
Potassium: 2.9 mmol/L — ABNORMAL LOW (ref 3.5–5.1)
Sodium: 138 mmol/L (ref 135–145)
Total Bilirubin: 0.7 mg/dL (ref 0.3–1.2)
Total Protein: 6.2 g/dL — ABNORMAL LOW (ref 6.5–8.1)

## 2015-09-02 LAB — GLUCOSE, CAPILLARY
Glucose-Capillary: 102 mg/dL — ABNORMAL HIGH (ref 65–99)
Glucose-Capillary: 127 mg/dL — ABNORMAL HIGH (ref 65–99)
Glucose-Capillary: 136 mg/dL — ABNORMAL HIGH (ref 65–99)
Glucose-Capillary: 142 mg/dL — ABNORMAL HIGH (ref 65–99)
Glucose-Capillary: 143 mg/dL — ABNORMAL HIGH (ref 65–99)
Glucose-Capillary: 211 mg/dL — ABNORMAL HIGH (ref 65–99)
Glucose-Capillary: 57 mg/dL — ABNORMAL LOW (ref 65–99)

## 2015-09-02 LAB — MAGNESIUM: Magnesium: 1.6 mg/dL — ABNORMAL LOW (ref 1.7–2.4)

## 2015-09-02 LAB — CBC
HCT: 36.1 % — ABNORMAL LOW (ref 39.0–52.0)
Hemoglobin: 11.4 g/dL — ABNORMAL LOW (ref 13.0–17.0)
MCH: 29.4 pg (ref 26.0–34.0)
MCHC: 31.6 g/dL (ref 30.0–36.0)
MCV: 93 fL (ref 78.0–100.0)
Platelets: 268 10*3/uL (ref 150–400)
RBC: 3.88 MIL/uL — ABNORMAL LOW (ref 4.22–5.81)
RDW: 14.5 % (ref 11.5–15.5)
WBC: 8.1 10*3/uL (ref 4.0–10.5)

## 2015-09-02 LAB — VITAMIN B12: Vitamin B-12: 941 pg/mL — ABNORMAL HIGH (ref 180–914)

## 2015-09-02 LAB — VANCOMYCIN, TROUGH: Vancomycin Tr: 22 ug/mL — ABNORMAL HIGH (ref 10.0–20.0)

## 2015-09-02 LAB — FOLATE: Folate: 18.6 ng/mL (ref >5.9)

## 2015-09-02 MED ORDER — MORPHINE SULFATE (PF) 2 MG/ML IV SOLN
1.0000 mg | INTRAVENOUS | Status: DC | PRN
  Administered 2015-09-02 – 2015-09-05 (×7): 2 mg via INTRAVENOUS
  Administered 2015-09-06: 1 mg via INTRAVENOUS
  Administered 2015-09-06 – 2015-09-07 (×4): 2 mg via INTRAVENOUS
  Administered 2015-09-07: 1 mg via INTRAVENOUS
  Administered 2015-09-07 – 2015-09-09 (×7): 2 mg via INTRAVENOUS
  Filled 2015-09-02 (×20): qty 1

## 2015-09-02 MED ORDER — INSULIN GLARGINE 100 UNIT/ML ~~LOC~~ SOLN
6.0000 [IU] | Freq: Every day | SUBCUTANEOUS | Status: DC
  Administered 2015-09-02 – 2015-09-07 (×5): 6 [IU] via SUBCUTANEOUS
  Filled 2015-09-02 (×7): qty 0.06

## 2015-09-02 MED ORDER — DEXTROSE 50 % IV SOLN
INTRAVENOUS | Status: AC
  Administered 2015-09-02: 25 mL
  Filled 2015-09-02: qty 50

## 2015-09-02 MED ORDER — METHOCARBAMOL 1000 MG/10ML IJ SOLN
500.0000 mg | Freq: Three times a day (TID) | INTRAVENOUS | Status: DC | PRN
  Administered 2015-09-09: 500 mg via INTRAVENOUS
  Filled 2015-09-02 (×3): qty 5

## 2015-09-02 MED ORDER — TRAMADOL HCL 50 MG PO TABS
50.0000 mg | ORAL_TABLET | Freq: Four times a day (QID) | ORAL | Status: DC | PRN
  Administered 2015-09-03 – 2015-09-09 (×9): 50 mg via ORAL
  Filled 2015-09-02 (×10): qty 1

## 2015-09-02 MED ORDER — POTASSIUM CHLORIDE CRYS ER 20 MEQ PO TBCR
40.0000 meq | EXTENDED_RELEASE_TABLET | Freq: Three times a day (TID) | ORAL | Status: AC
  Administered 2015-09-02 (×3): 40 meq via ORAL
  Filled 2015-09-02 (×3): qty 2

## 2015-09-02 MED ORDER — MAGNESIUM SULFATE 2 GM/50ML IV SOLN
2.0000 g | Freq: Once | INTRAVENOUS | Status: AC
  Administered 2015-09-02: 2 g via INTRAVENOUS
  Filled 2015-09-02: qty 50

## 2015-09-02 MED ORDER — METOPROLOL TARTRATE 100 MG PO TABS
100.0000 mg | ORAL_TABLET | Freq: Two times a day (BID) | ORAL | Status: DC
  Administered 2015-09-02 – 2015-09-09 (×13): 100 mg via ORAL
  Filled 2015-09-02 (×6): qty 1
  Filled 2015-09-02: qty 2
  Filled 2015-09-02 (×2): qty 1
  Filled 2015-09-02: qty 2
  Filled 2015-09-02 (×2): qty 1
  Filled 2015-09-02: qty 2

## 2015-09-02 MED ORDER — IPRATROPIUM-ALBUTEROL 0.5-2.5 (3) MG/3ML IN SOLN: 3.0000 mL | RESPIRATORY_TRACT | Status: DC | PRN

## 2015-09-02 NOTE — Progress Notes (Signed)
Pharmacy Antibiotic Note  Jacob [name] is a 75 y.o. male admitted on 08/28/2015 with discitis.  Pharmacy has been consulted for vancomycin and ceftriaxone dosing.  Vancomycin trough level obtained this AM = 22 slightly over goal of 15-20, but doses given off schedule yesterday, and trough level drawn around 10 hrs after dose given.  Expect "true" trough is in 15-20 range.  Plan: 1. Continue vancomycin 750 mg IV q 12 hrs. 2. Ceftriaxone 2g IV q 24 hrs. 3. F/u culture results to narrow antibiotics if possible.  Height: 5\' 11"  (180.3 cm) Weight: 215 lb (97.523 kg) IBW/kg (Calculated) : 75.3  Temp (24hrs), Avg:98.4 F (36.9 C), Min:97.7 F (36.5 C), Max:99.6 F (37.6 C)   Recent Labs Lab 08/29/15 1034 08/30/15 0310 08/31/15 0315 09/01/15 0412 09/02/15 0630 09/02/15 0644  WBC 6.3 6.0 8.8 6.6  --  8.1  CREATININE 1.44* 1.35* 1.29* 1.14  --  1.31*  VANCOTROUGH  --   --   --   --  22*  --     Estimated Creatinine Clearance: 58 mL/min (by C-G formula based on Cr of 1.31).    Allergies  Allergen Reactions  . Ace Inhibitors Other (See Comments)    Probably nausea and vomiting per patient   . Actonel [Risedronate] Nausea And Vomiting  . Ciprocinonide [Fluocinolone] Other (See Comments)    Probably nausea and vomiting per patient  . Flunisolide Other (See Comments)    Probably nausea and vomiting per patient   . Metformin And Related Other (See Comments)    Probably nausea and vomiting per patient   . Sertraline Other (See Comments)    Probably nausea and vomiting per patient   . Sulindac Other (See Comments)    Probably nausea and vomiting per patient   . Terazosin Other (See Comments)    Probably nausea and vomiting per patient     Antimicrobials this admission: Ceftriaxone 6/16>  Vancomycin 6/16>  Dose adjustments this admission: 6/19 VT = 22 (but doses off schedule a bit, and drawn ~ 10 hrs after dose) > con't 750 mg q 12 hrs  Microbiology results: 6/16 BCx x  2 > NGTD 6/16 Intervertebral disc fluid > NGTD  Thank you for allowing pharmacy to be a part of this patient's care.  Uvaldo Rising, BCPS  Clinical Pharmacist Pager [phone]  09/02/2015 8:28 AM

## 2015-09-02 NOTE — Progress Notes (Signed)
[place] TEAM 1 - Stepdown/ICU TEAM  [name]  Jacob [name] DOB: [birth year] DOA: 08/28/2015 PCP: [name], MD    Brief Narrative:  75yo w/ Hx Anxiety, Lumbar stenosis S/P laminectomy and Discectomy May 2017 by Dr Christella Noa, COPD, RA, HTN, OSA, CAD S/P CABG 7 vessels, DM2 w/ neuropathy, PMR, CKD, Cancer of kidney,and Hepatitiswho presented with worsening encephalopathy for 24 hours. He was discharged to home on June 8th. On 10th of June he began to get confused. He threatened family.  He had psych eval at the [place] and was told to go to his PCP.    He went to the [facility] ED where chest x-ray, UA, and CT scan of the head were unrevealing.  MRI of the back was worrisome for infection versus surgical changes. He was transferred to [facility] SDU for admission.    Subjective: The pt is awake but confused.  He is pleasant, but impulsive.  He was found kneeling beside his bed prior to my arrival, with no evidence of an actual fall.  He can't tell me where he is, but does remember having back surgery.  He can not provide a reliable ROS.    Assessment & Plan:  Acute encephalopathy - threatening violence against family and self -persists - pt is very difficult to manage due to impulsiveness and inability to respond to reason - order sitter - keep in SDU due to very high risk of accidental self harm - complete metabolic w/u - stop flexeril - attempt to avoid benzos - ammonia normal   Lumbar discitis v/s post-op inflammation - Lumbar Spinal stenosis s/p laminectomy  -ID and NS already following -on empiric abx tx w/ plan for 6 weeks of tx -no growth x3 days from IR aspiration of disc space -6/16 Dr. Cyndy Freeze offered to perform surgery to ensure no infection - patient and family declined  CAD   -asymptomatic - no acute findings on EKG 6/15  Chronic Grade 1 Diastolic CHF  -TTE May 0000000 showed no evidence of CHF - repeat TTE this admit stable - no clinical evidence of volume overload    Filed Weights   08/31/15 0500 09/01/15 0400 09/02/15 0500  Weight: 95.9 kg (211 lb 6.7 oz) 97.7 kg (215 lb 6.2 oz) 97.523 kg (215 lb)    OSA  -CPAP at home but unable to use secondary to claustrophobia.  Rheumatoid arthritis  -prednisone 7 mg daily at home   Acute urinary retention - hx of BPH -foley placed 6/19 due to 486cc in bladder w/ inability to void - cont flomax and proscar  Renal cancer  -S/P Ablation  CKD Stage 4 (baseline Cr~1.49-2.0) -creatinine stable  Elevated troponin  -asymptomatic - likely secondary to demand ischemia.   DM2 -6/15 A1c 6.8  Elevated LFTs -resolved - acute hepatitis panel negative   Hypokalemia -replace and follow  Hypomagnesemia -replace and follow    Anorexia - Protein calorie malnutrition  -Megace 800 mg daily  DVT prophylaxis: SCDs Code Status: FULL CODE Family Communication: no family present at time of exam  Disposition Plan: SDU   Consultants:  Neurosurgery ID  Psychiatry  Procedures: 5/16 LUMBAR LAMINECTOMY DISCECTOMY L3/4 bilateral 6/15 MRI brain negative acute CVA-mild to moderate Chronic small vessel ischemic disease 6/15 MRI L spine W/WO contrast Postoperative changes at the L3-L4 level with superimposed large cephalad disc extrusion causing severe spinal stenosis, as well as abnormal fluid signal in the L3-L4 disc space associated with mild L4 superior endplate fracture with L3 and  L4 endplate edema/inflammation - Mild associated psoas muscle inflammation -Discitis/ osteomyelitis at L3-L4?? - Small postoperative fluid collection overlying the L3 spinous process, favor seroma. 6/15 TTE Left ventricle: mildly dilated.-LVEF= 55% - 60%.- (grade 1 diastolic dysfunction) [id number] L3-L4 fluoro guided disc aspiration   Antimicrobials:  Ceftriaxone 6/16 > Vancomycin 6/16 >  Objective: Blood pressure 163/84, pulse 101, temperature 97.3 F (36.3 C), temperature source Oral, resp. rate 19, height 5\' 11"  (1.803 m), weight  97.523 kg (215 lb), SpO2 100 %.  Intake/Output Summary (Last 24 hours) at 09/02/15 0945 Last data filed at 09/02/15 0800  Gross per 24 hour  Intake    713 ml  Output   2400 ml  Net  -1687 ml   Filed Weights   08/31/15 0500 09/01/15 0400 09/02/15 0500  Weight: 95.9 kg (211 lb 6.7 oz) 97.7 kg (215 lb 6.2 oz) 97.523 kg (215 lb)    Examination: General: No acute respiratory distress - alert but confused  Lungs: Clear to auscultation bilaterally without wheezes or crackles Cardiovascular: Regular rate and rhythm without murmur gallop or rub normal S1 and S2 Abdomen: Nontender, nondistended, soft, bowel sounds positive, no rebound, no ascites, no appreciable mass Extremities: No significant cyanosis, clubbing, or edema bilateral lower extremities  CBC:  Recent Labs Lab 08/28/15 2312 08/29/15 1034 08/30/15 0310 08/31/15 0315 09/01/15 0412 09/02/15 0644  WBC 6.4 6.3 6.0 8.8 6.6 8.1  NEUTROABS 3.8  --   --   --   --   --   HGB 11.8* 11.3* 10.9* 10.2* 10.6* 11.4*  HCT 37.5* 36.2* 34.8* 32.1* 34.0* 36.1*  MCV 95.2 95.5 93.3 93.6 93.2 93.0  PLT 254 250 248 244 250 [id number]   Basic Metabolic Panel:  Recent Labs Lab 08/29/15 1034 08/30/15 0310 08/31/15 0315 09/01/15 0412 09/01/15 2221 09/02/15 0644  NA 140 140 141 140  --  138  K 3.5 3.5 3.5 2.8* 3.2* 2.9*  CL 106 106 109 104  --  103  CO2 25 26 27 28   --  28  GLUCOSE 184* 228* 163* 60*  --  90  BUN 12 16 16 10   --  8  CREATININE 1.44* 1.35* 1.29* 1.14  --  1.31*  CALCIUM 9.0 9.1 9.0 9.2  --  9.3  MG 1.6* 1.7 1.7 1.6* 1.7 1.6*  PHOS 3.8  --   --   --   --   --    GFR: Estimated Creatinine Clearance: 58 mL/min (by C-G formula based on Cr of 1.31).  Liver Function Tests:  Recent Labs Lab 08/29/15 1034 08/30/15 0310 08/31/15 0315 09/01/15 0412 09/02/15 0644  AST 31 25 27 29  38  ALT 61 53 44 45 51  ALKPHOS 407* 384* 341* 355* 348*  BILITOT 1.2 0.8 0.6 0.7 0.7  PROT 5.9* 5.8* 5.5* 5.8* 6.2*  ALBUMIN 2.6* 2.4* 2.4*  2.7* 2.8*    Recent Labs Lab 08/28/15 2313  AMMONIA 28    Coagulation Profile:  Recent Labs Lab 08/30/15 1317  INR 1.02    Cardiac Enzymes:  Recent Labs Lab 08/28/15 2312 08/29/15 1034 08/30/15 0310  TROPONINI 0.05* 0.04*  0.04* 0.03    HbA1C: HGB A1C MFR BLD  Date/Time Value Ref Range Status  08/29/2015 10:34 AM 6.8* 4.8 - 5.6 % Final    Comment:    (NOTE)         Pre-diabetes: 5.7 - 6.4         Diabetes: >6.4  Glycemic control for adults with diabetes: <7.0   08/04/2015 08:55 AM 7.4* 4.8 - 5.6 % Final    Comment:    (NOTE)         Pre-diabetes: 5.7 - 6.4         Diabetes: >6.4         Glycemic control for adults with diabetes: <7.0     CBG:  Recent Labs Lab 09/01/15 2126 09/02/15 0013 09/02/15 0526 09/02/15 0833 09/02/15 0854  GLUCAP 225* 127* 143* 73* 102*    Recent Results (from the past 240 hour(s))  MRSA PCR Screening     Status: None   Collection Time: 08/29/15  2:20 PM  Result Value Ref Range Status   MRSA by PCR NEGATIVE NEGATIVE Final    Comment:        The GeneXpert MRSA Assay (FDA approved for NASAL specimens only), is one component of a comprehensive MRSA colonization surveillance program. It is not intended to diagnose MRSA infection nor to guide or monitor treatment for MRSA infections.   Culture, blood (routine x 2)     Status: None (Preliminary result)   Collection Time: 08/30/15  1:25 PM  Result Value Ref Range Status   Specimen Description BLOOD RIGHT ANTECUBITAL  Final   Special Requests IN PEDIATRIC BOTTLE 2CC  Final   Culture NO GROWTH 2 DAYS  Final   Report Status PENDING  Incomplete  Culture, blood (routine x 2)     Status: None (Preliminary result)   Collection Time: 08/30/15  1:30 PM  Result Value Ref Range Status   Specimen Description BLOOD RIGHT ANTECUBITAL  Final   Special Requests IN PEDIATRIC BOTTLE 3CC  Final   Culture NO GROWTH 2 DAYS  Final   Report Status PENDING  Incomplete  Body fluid  culture     Status: None (Preliminary result)   Collection Time: 08/30/15  5:17 PM  Result Value Ref Range Status   Specimen Description FLUID  Final   Special Requests INTERVERTEBRAL DISC  Final   Gram Stain   Final    RARE WBC PRESENT,BOTH PMN AND MONONUCLEAR NO ORGANISMS SEEN    Culture NO GROWTH 2 DAYS  Final   Report Status PENDING  Incomplete     Scheduled Meds: . allopurinol  300 mg Oral Q lunch  . amLODipine  10 mg Oral Q supper  . antiseptic oral rinse  7 mL Mouth Rinse BID  . aspirin EC  81 mg Oral Q lunch  . atorvastatin  20 mg Oral q1800  . calcitRIOL  0.25 mcg Oral Q M,W,F  . cefTRIAXone (ROCEPHIN)  IV  2 g Intravenous Q24H  . cyclobenzaprine  5 mg Oral TID  . cycloSPORINE  1 drop Both Eyes BID  . feeding supplement (GLUCERNA SHAKE)  237 mL Oral TID BM  . finasteride  5 mg Oral QHS  . insulin aspart  0-20 Units Subcutaneous Q4H  . insulin glargine  12 Units Subcutaneous QHS  . ipratropium-albuterol  3 mL Nebulization TID  . Liraglutide  1.2 mg Subcutaneous Q1500  . LORazepam  0.5 mg Oral BID  . magnesium sulfate 1 - 4 g bolus IVPB  2 g Intravenous Once  . megestrol  800 mg Oral Daily  . metoprolol  50 mg Oral BID  . mometasone-formoterol  2 puff Inhalation BID  . montelukast  10 mg Oral QHS  . pilocarpine  1 drop Both Eyes TID  . polyethylene glycol  17 g Oral BID  .  potassium chloride  40 mEq Oral TID  . predniSONE  7 mg Oral Q breakfast  . senna-docusate  2 tablet Oral BID  . sodium chloride flush  10-40 mL Intracatheter Q12H  . sodium chloride flush  3 mL Intravenous Q12H  . tamsulosin  0.4 mg Oral QHS  . traZODone  100 mg Oral QHS  . vancomycin  750 mg Intravenous Q12H   Continuous Infusions: . sodium chloride 1,000 mL (09/01/15 2141)     LOS: 5 days   Time spent: 35 minutes   Cherene Altes, MD [facility] Office  [phone] Pager - Text Page per Shea Evans as per below:  On-Call/Text Page:      [email or web address]      password [email or web address]  If  7PM-7AM, please contact night-coverage [email or web address] Password [email or web address] 09/02/2015, 9:45 AM

## 2015-09-02 NOTE — Progress Notes (Signed)
[facility]  Patient Status:  New pt for [facility] this admission    [facility] is providing the following services:  HHRN and other [place] services provided by [facility]. [facility] will partner with [facility] to support/provide IV ABX in the home. [facility] will be available along with the [place] team to support DC home when ordered.  If patient discharges after hours, please call [phone].   Larry Sierras 09/02/2015, 5:12 PM

## 2015-09-02 NOTE — Progress Notes (Signed)
Diagnosis: Early Lumbar Discitis  Culture Result: NGTD   -- Ace Inhibitors -- Other (See Comments)   --  Probably nausea and vomiting per patient  -- Actonel [Risedronate] -- Nausea And Vomiting  -- Ciprocinonide [Fluocinolone] -- Other (See Comments)   --  Probably nausea and vomiting per patient  -- Flunisolide -- Other (See Comments)   --  Probably nausea and vomiting per patient  -- Metformin And Related -- Other (See Comments)   --  Probably nausea and vomiting per patient  -- Sertraline -- Other (See Comments)   --  Probably nausea and vomiting per patient  -- Sulindac -- Other (See Comments)   --  Probably nausea and vomiting per patient  -- Terazosin -- Other (See Comments)   --  Probably nausea and vomiting per patient  Discharge antibiotics: Vancomycin per pharmacy protocol (goal of 15-20) and Ceftriaxone 2 grams IV daily Aim for Vancomycin trough 15-20 (unless otherwise indicated) Duration: 6 weeks (6/16 until 7/27) End Date: 10/10/15  [facility] Care Per Protocol:  Labs weekly while on IV antibiotics: _x_ CBC with differential _x_ CMP twice a week  _x_ CRP _x_ ESR _x_ Vancomycin trough  Fax weekly labs to [phone]  Martyn Malay, DO PGY-2 Internal Medicine Resident Pager # [phone] 09/02/2015 1:21 PM

## 2015-09-02 NOTE — Progress Notes (Signed)
Spoke w wife and da at home. They cannot care for pt at home w weakness and his confusion. Needs 6 weeks iv antibiotics. Have made sw ref. Will cont to follow.

## 2015-09-02 NOTE — Progress Notes (Signed)
Bladder slightly distended, bladder scan done- 486 ml, MD aware with order to insert foley cath.

## 2015-09-02 NOTE — Progress Notes (Signed)
Cbg-56, refused orange juice, dextrose 50% 25 ml given. Repeat cbg-102 mg/dl. MD aware, continue to monitor.

## 2015-09-02 NOTE — Care Management Important Message (Signed)
Important Message  Patient Details  Name: Jacob [name] MRN: [name] Date of Birth: [birth year]   Medicare Important Message Given:  Yes    Loann Quill 09/02/2015, 10:03 AM

## 2015-09-02 NOTE — Progress Notes (Addendum)
Was found on a kneeling position on the floor holding on the bed by MD resident, after hearing him shouting for help. Denied hitting his head. Claimed" I did not fall." no apparent injury noted. Assisted back to bed with 2 person assist. MD aware. V/s taken bp- 152/78., hr 108, r-16 Continue to monitor. Sitter at bedside.Continue  to monitor.

## 2015-09-02 NOTE — Progress Notes (Signed)
[place] for Infectious Disease    Date of Admission:  08/28/2015    ID: Jacob [name] is a 75 y.o. male with possible early discitis.   Principal Problem:   Adjustment disorder with mixed emotional features Active Problems:   CAD (coronary artery disease)   Diabetes mellitus, type 2 (HCC)   Neuropathy (HCC)   Rheumatoid arthritis ([place])   CHF with left ventricular diastolic dysfunction, NYHA class 2 (HCC)   OSA (obstructive sleep apnea)   Altered mental status   History of COPD   Spinal stenosis of lumbar region with radiculopathy   Renal cancer (HCC)   Acute encephalopathy   Elevated troponin   Elevated LFTs   CAD in native artery   Uncontrolled type 2 diabetes mellitus with complication (HCC)   Rheumatoid arthritis involving vertebra with positive rheumatoid factor ([place])   Spinal stenosis of lumbar region   Controlled diabetes mellitus type 2 with complications ([place])  Subjective: Jacob [name] is a 75 y.o. male with PMHx of herniated lumbar disc at L3-L4 s/p lumbar laminectomy and discectomy at L3-L4 on 07/30/15 with a recent MRI of the lumbar spine concerning for early discitis at L3-L4.   Patient was seen and examined this morning. He complains of severe pain in his low back at a 9/10. He also complains of decreased appetite, nausea, and one episode of vomiting over the weekend. He denies any associated abdominal pain or diarrhea.   Medications:  . allopurinol  300 mg Oral Q lunch  . amLODipine  10 mg Oral Q supper  . antiseptic oral rinse  7 mL Mouth Rinse BID  . aspirin EC  81 mg Oral Q lunch  . atorvastatin  20 mg Oral q1800  . calcitRIOL  0.25 mcg Oral Q M,W,F  . cefTRIAXone (ROCEPHIN)  IV  2 g Intravenous Q24H  . cyclobenzaprine  5 mg Oral TID  . cycloSPORINE  1 drop Both Eyes BID  . feeding supplement (GLUCERNA SHAKE)  237 mL Oral TID BM  . finasteride  5 mg Oral QHS  . insulin aspart  0-20 Units Subcutaneous Q4H  . insulin glargine  12 Units  Subcutaneous QHS  . ipratropium-albuterol  3 mL Nebulization TID  . Liraglutide  1.2 mg Subcutaneous Q1500  . LORazepam  0.5 mg Oral BID  . magnesium sulfate 1 - 4 g bolus IVPB  2 g Intravenous Once  . megestrol  800 mg Oral Daily  . metoprolol  50 mg Oral BID  . mometasone-formoterol  2 puff Inhalation BID  . montelukast  10 mg Oral QHS  . pilocarpine  1 drop Both Eyes TID  . polyethylene glycol  17 g Oral BID  . potassium chloride  40 mEq Oral TID  . predniSONE  7 mg Oral Q breakfast  . senna-docusate  2 tablet Oral BID  . sodium chloride flush  10-40 mL Intracatheter Q12H  . sodium chloride flush  3 mL Intravenous Q12H  . tamsulosin  0.4 mg Oral QHS  . traZODone  100 mg Oral QHS  . vancomycin  750 mg Intravenous Q12H    Objective: Filed Vitals:   09/02/15 0518 09/02/15 0534 09/02/15 0833 09/02/15 0838  BP: 160/98  163/84 163/84  Pulse: 100  105 101  Temp: 99.6 F (37.6 C)   97.3 F (36.3 C)  TempSrc: Axillary   Oral  Resp: 23 24 18 19   Height:      Weight:  SpO2: 94%  95% 100%   General: Vital signs reviewed.  Patient is elderly, in no acute distress and cooperative with exam.  Cardiovascular: Tachycardic, regular rhythm, S1 normal, S2 normal, no murmurs, gallops, or rubs. Pulmonary/Chest: Mild inspiratory crackles in lower lung fields, no wheezing or rhonchi.  Abdominal: Soft, non-tender, non-distended, BS + Back: Post-surgical incision is C/D/I Neurological: Awake, alert, oriented. Answering questions appropriately.  Psychiatric: Normal mood and affect. speech and behavior is normal.   Lab Results  Recent Labs  09/01/15 0412 09/01/15 2221 09/02/15 0644  WBC 6.6  --  8.1  HGB 10.6*  --  11.4*  HCT 34.0*  --  36.1*  NA 140  --  138  K 2.8* 3.2* 2.9*  CL 104  --  103  CO2 28  --  28  BUN 10  --  8  CREATININE 1.14  --  1.31*   Liver Panel  Recent Labs  09/01/15 0412 09/02/15 0644  PROT 5.8* 6.2*  ALBUMIN 2.7* 2.8*  AST 29 38  ALT 45 51    ALKPHOS 355* 348*  BILITOT 0.7 0.7   Microbiology: 6/16 blood cx NGTD 6/16 body fluid NGTD  Antibiotics: Vancomycin: 6/16>> Ceftriaxone: 6/16>>  Assessment/Plan: Jacob [name] is a 75 y.o. male with PMHx of herniated lumbar disc at L3-L4 s/p lumbar laminectomy and discectomy at L3-L4 on 07/30/15 with a recent MRI of the lumbar spine concerning for early discitis at L3-L4.   Possible Post-Surgical Early Discitis: S/p disc aspiration on 6/16. Body fluid culture and blood cultures have been NGTD. Patient remains afebrile, but with one low grade temp this morning at 99.6. No leukocytosis. Differential includes inflammation secondary to disc fragmentation versus infection. PICC line in place. Per patient, NS may take him back for repeat surgery.  -Follow culture results  -Continue vancomycin and ceftriaxone, narrow based on culture results -Will likely need 6 weeks of IV therapy  Adjustment Disorder: Patient was evaluated by psychiatry due to threats to harm himself. Psychiatry feels his symptoms are due to adjustment disorder mostly surrounding his ongoing medical issues and pain. They recommended d/c Paxil and starting trazodone 100 mg QHS for sleep and aggression.  Controlled T2DM: Last HgbA1c 6.8 on 08/29/15.  -Per primary  PMR and RA: Patient is on chronic steroid therapy predisposing him to immunosuppression.   Martyn Malay, DO PGY-2 Internal Medicine Resident Pager # [phone] 09/02/2015 9:03 AM

## 2015-09-02 NOTE — Progress Notes (Signed)
In and out cath. done aseptically, obtained 500 ml amber urine.

## 2015-09-02 NOTE — Progress Notes (Deleted)
Unable to insert 16fr or 14fr foley catheter due to size of urethra. 12fr catheter ordered. Perineal care performed before and after catheter insertion. Foley catheter inserted with ease using sterile technique. Seal broken due to having to use a smaller size catheter, but attached to new tubing and bag using sterile technique. Bag labeled hung at the side of the bed below level of bladder. Will continue to monitor. 

## 2015-09-03 DIAGNOSIS — R4182 Altered mental status, unspecified: Secondary | ICD-10-CM | POA: Diagnosis not present

## 2015-09-03 DIAGNOSIS — R630 Anorexia: Secondary | ICD-10-CM | POA: Diagnosis present

## 2015-09-03 DIAGNOSIS — M79604 Pain in right leg: Secondary | ICD-10-CM | POA: Diagnosis not present

## 2015-09-03 DIAGNOSIS — Z9889 Other specified postprocedural states: Secondary | ICD-10-CM | POA: Diagnosis not present

## 2015-09-03 DIAGNOSIS — E119 Type 2 diabetes mellitus without complications: Secondary | ICD-10-CM | POA: Diagnosis not present

## 2015-09-03 DIAGNOSIS — M79605 Pain in left leg: Secondary | ICD-10-CM | POA: Diagnosis not present

## 2015-09-03 DIAGNOSIS — M4646 Discitis, unspecified, lumbar region: Secondary | ICD-10-CM | POA: Diagnosis not present

## 2015-09-03 DIAGNOSIS — G9340 Encephalopathy, unspecified: Secondary | ICD-10-CM | POA: Diagnosis not present

## 2015-09-03 DIAGNOSIS — E118 Type 2 diabetes mellitus with unspecified complications: Secondary | ICD-10-CM | POA: Diagnosis not present

## 2015-09-03 DIAGNOSIS — M353 Polymyalgia rheumatica: Secondary | ICD-10-CM | POA: Diagnosis not present

## 2015-09-03 DIAGNOSIS — R7989 Other specified abnormal findings of blood chemistry: Secondary | ICD-10-CM | POA: Diagnosis not present

## 2015-09-03 DIAGNOSIS — F4323 Adjustment disorder with mixed anxiety and depressed mood: Secondary | ICD-10-CM | POA: Diagnosis not present

## 2015-09-03 DIAGNOSIS — I2510 Atherosclerotic heart disease of native coronary artery without angina pectoris: Secondary | ICD-10-CM | POA: Diagnosis not present

## 2015-09-03 DIAGNOSIS — F4320 Adjustment disorder, unspecified: Secondary | ICD-10-CM | POA: Diagnosis not present

## 2015-09-03 DIAGNOSIS — I5030 Unspecified diastolic (congestive) heart failure: Secondary | ICD-10-CM | POA: Diagnosis not present

## 2015-09-03 LAB — GLUCOSE, CAPILLARY
Glucose-Capillary: 152 mg/dL — ABNORMAL HIGH (ref 65–99)
Glucose-Capillary: 159 mg/dL — ABNORMAL HIGH (ref 65–99)
Glucose-Capillary: 160 mg/dL — ABNORMAL HIGH (ref 65–99)
Glucose-Capillary: 168 mg/dL — ABNORMAL HIGH (ref 65–99)
Glucose-Capillary: 189 mg/dL — ABNORMAL HIGH (ref 65–99)
Glucose-Capillary: 196 mg/dL — ABNORMAL HIGH (ref 65–99)
Glucose-Capillary: 197 mg/dL — ABNORMAL HIGH (ref 65–99)

## 2015-09-03 LAB — CBC
HCT: 37.4 % — ABNORMAL LOW (ref 39.0–52.0)
Hemoglobin: 11.9 g/dL — ABNORMAL LOW (ref 13.0–17.0)
MCH: 29.9 pg (ref 26.0–34.0)
MCHC: 31.8 g/dL (ref 30.0–36.0)
MCV: 94 fL (ref 78.0–100.0)
Platelets: 270 10*3/uL (ref 150–400)
RBC: 3.98 MIL/uL — ABNORMAL LOW (ref 4.22–5.81)
RDW: 14.5 % (ref 11.5–15.5)
WBC: 9.2 10*3/uL (ref 4.0–10.5)

## 2015-09-03 LAB — BASIC METABOLIC PANEL
Anion gap: 8 (ref 5–15)
BUN: 10 mg/dL (ref 6–20)
CO2: 26 mmol/L (ref 22–32)
Calcium: 9.3 mg/dL (ref 8.9–10.3)
Chloride: 104 mmol/L (ref 101–111)
Creatinine, Ser: 1.26 mg/dL — ABNORMAL HIGH (ref 0.61–1.24)
GFR calc Af Amer: >60 mL/min (ref >60)
GFR calc non Af Amer: 54 mL/min — ABNORMAL LOW (ref >60)
Glucose, Bld: 164 mg/dL — ABNORMAL HIGH (ref 65–99)
Potassium: 4 mmol/L (ref 3.5–5.1)
Sodium: 138 mmol/L (ref 135–145)

## 2015-09-03 LAB — MAGNESIUM: Magnesium: 2.1 mg/dL (ref 1.7–2.4)

## 2015-09-03 NOTE — Progress Notes (Signed)
[place] for Infectious Disease    Date of Admission:  08/28/2015    ID: Jacob [name] is a 75 y.o. male with possible early discitis.   Principal Problem:   Adjustment disorder with mixed emotional features Active Problems:   CAD (coronary artery disease)   Diabetes mellitus, type 2 (HCC)   Neuropathy (HCC)   Rheumatoid arthritis ([place])   CHF with left ventricular diastolic dysfunction, NYHA class 2 (HCC)   OSA (obstructive sleep apnea)   Altered mental status   History of COPD   Spinal stenosis of lumbar region with radiculopathy   Renal cancer (HCC)   Acute encephalopathy   Elevated troponin   Elevated LFTs   CAD in native artery   Uncontrolled type 2 diabetes mellitus with complication (HCC)   Rheumatoid arthritis involving vertebra with positive rheumatoid factor ([place])   Spinal stenosis of lumbar region   Controlled diabetes mellitus type 2 with complications ([place])  Subjective: Jacob [name] is a 75 y.o. male with PMHx of herniated lumbar disc at L3-L4 s/p lumbar laminectomy and discectomy at L3-L4 on 07/30/15 with a recent MRI of the lumbar spine concerning for early discitis at L3-L4.   Patient was seen and examined this morning. Patient continues to have low back pain, but denies fever or chills.   Medications:  . allopurinol  300 mg Oral Q lunch  . amLODipine  10 mg Oral Q supper  . antiseptic oral rinse  7 mL Mouth Rinse BID  . aspirin EC  81 mg Oral Q lunch  . atorvastatin  20 mg Oral q1800  . calcitRIOL  0.25 mcg Oral Q M,W,F  . cefTRIAXone (ROCEPHIN)  IV  2 g Intravenous Q24H  . cycloSPORINE  1 drop Both Eyes BID  . feeding supplement (GLUCERNA SHAKE)  237 mL Oral TID BM  . finasteride  5 mg Oral QHS  . insulin aspart  0-20 Units Subcutaneous Q4H  . insulin glargine  6 Units Subcutaneous QHS  . megestrol  800 mg Oral Daily  . metoprolol  100 mg Oral BID  . mometasone-formoterol  2 puff Inhalation BID  . montelukast  10 mg Oral QHS  .  pilocarpine  1 drop Both Eyes TID  . polyethylene glycol  17 g Oral BID  . predniSONE  7 mg Oral Q breakfast  . senna-docusate  2 tablet Oral BID  . sodium chloride flush  10-40 mL Intracatheter Q12H  . tamsulosin  0.4 mg Oral QHS  . traZODone  100 mg Oral QHS  . vancomycin  750 mg Intravenous Q12H    Objective: Filed Vitals:   09/03/15 0436 09/03/15 0814 09/03/15 0826 09/03/15 1213  BP: 158/82 154/91  139/81  Pulse:  90 93 82  Temp: 97.6 F (36.4 C) 98 F (36.7 C)  98.4 F (36.9 C)  TempSrc: Oral Oral  Oral  Resp: 18 17 16 18   Height:      Weight: 215 lb (97.523 kg)     SpO2: 97% 96% 96% 92%   General: Vital signs reviewed.  Patient is elderly, in no acute distress and cooperative with exam.  Cardiovascular: Regular rate, regular rhythm, S1 normal, S2 normal, no murmurs, gallops, or rubs. Pulmonary/Chest: CTA b/l, no wheezing or rhonchi.  Abdominal: Soft, non-tender, non-distended, BS + Neurological: Oriented to person and time, not place. His thoughts and speech wander and he does not answer questions appropriately  Lab Results  Recent Labs  09/02/15 0644 09/03/15 0245  WBC 8.1 9.2  HGB 11.4* 11.9*  HCT 36.1* 37.4*  NA 138 138  K 2.9* 4.0  CL 103 104  CO2 28 26  BUN 8 10  CREATININE 1.31* 1.26*   Liver Panel  Recent Labs  09/01/15 0412 09/02/15 0644  PROT 5.8* 6.2*  ALBUMIN 2.7* 2.8*  AST 29 38  ALT 45 51  ALKPHOS 355* 348*  BILITOT 0.7 0.7   Microbiology: 6/16 blood cx NGTD 6/16 body fluid: rare WBC present, both PMN and mononuclear, no organisms seen  Antibiotics: Vancomycin: 6/16>> Ceftriaxone: 6/16>>  Assessment/Plan: Jacob [name] is a 75 y.o. male with PMHx of herniated lumbar disc at L3-L4 s/p lumbar laminectomy and discectomy at L3-L4 on 07/30/15 with a recent MRI of the lumbar spine concerning for early discitis at L3-L4.   Possible Post-Surgical Early Discitis: S/p disc aspiration on 6/16. Body fluid culture with rare WBC and blood  cultures have been NGTD. PICC line in place. Patient should receive 6 weeks of ceftriaxone 2 grams IV daily and vancomycin per pharmacy protocol (goal 15-20). -Continue vancomycin and ceftriaxone as above for 6 weeks of IV therapy -ID will sign off  Adjustment Disorder: Patient is on trazodone 100 mg QHS for sleep and aggression.   Controlled T2DM: Last HgbA1c 6.8 on 08/29/15.  -Per primary  PMR and RA: Patient is on chronic steroid therapy predisposing him to immunosuppression.   Martyn Malay, DO PGY-2 Internal Medicine Resident Pager # [phone] 09/03/2015 1:18 PM

## 2015-09-03 NOTE — Clinical Social Work Note (Signed)
Clinical Social Work Assessment  Patient Details  Name: Jacob [name] MRN: [name] Date of Birth: 1940-10-04  Date of referral:  09/03/15               Reason for consult:  Facility Placement                Permission sought to share information with:  Facility Sport and exercise psychologist, Family Supports Permission granted to share information::  Yes, Verbal Permission Granted  Name::     Jacob [name], Jacob [name] [phone] or [phone] or Jacob [name] Daughter [phone]  Agency::  SNF Admissions  Relationship::     Contact Information:     Housing/Transportation Living arrangements for the past 2 months:  [place] of Information:  Patient, Adult Children Patient Interpreter Needed:  None Criminal Activity/Legal Involvement Pertinent to Current Situation/Hospitalization:  No - Comment as needed Significant Relationships:  Adult Children, Spouse Lives with:  Spouse Do you feel safe going back to the place where you live?  No Need for family participation in patient care:  Yes (Comment) (Patient has had some confusion)  Care giving concerns: Patient's family feel they can not handle him at home, and he will need to go to SNF for rehab and IV antibiotics.   Social Worker assessment / plan: Patient is a 75 year old male who is married, and patient is alert and oriented x2.  Patient has some confusion, CSW spoke to patient's daughter to discuss SNF placement.  Patient was at a SNF recently, then he discharged back home, and was readmitted to the hospital after only 3 days.  Patient's family stated they can not handle him at home right now until he has gained some strength again.  Patient's family are familiar with the process of discharge planning and know what to expect.  Patient's family did not have any other questions or concerns.  Employment status:  Retired Nurse, adult, [employer] PT Recommendations:  Not assessed at this  time Information / Referral to community resources:  [place]  Patient/Family's Response to care:  Patient's family is in agreement to having patient go to SNF for rehab and IV antibiotics.  Patient/Family's Understanding of and Emotional Response to Diagnosis, Current Treatment, and Prognosis:  Patient aware of current treatment plan and prognosis.  Emotional Assessment Appearance:  Appears stated age Attitude/Demeanor/Rapport:    Affect (typically observed):  Appropriate, Calm, Stable Orientation:  Oriented to Self, Oriented to Place Alcohol / Substance use:  Not Applicable Psych involvement (Current and /or in the community):  No (Comment)  Discharge Needs  Concerns to be addressed:  Lack of Support Readmission within the last 30 days:  Yes (Patient was discharged on 08-09-15 to [facility]) Current discharge risk:  Lack of support system Barriers to Discharge:  Ship broker, Continued Medical Work up   Anell Barr 09/03/2015, 12:28 PM

## 2015-09-03 NOTE — Progress Notes (Signed)
Patient ID: Jacob [name], male   DOB: 05-31-1940, 75 y.o.   MRN: [name] BP 156/80 mmHg  Pulse 89  Temp(Src) 97.7 F (36.5 C) (Oral)  Resp 18  Ht 5\' 11"  (1.803 m)  Wt 97.523 kg (215 lb)  BMI 30.00 kg/m2  SpO2 96% Alert, confused, following commands Appears ill I believe the scan shows a disc recurrence. This is more likely than this being a discitis. It certainly can be both, but either way the disc needs to be removed. I plan on Friday this week for the OR.  Moving all extremities Perrl, full eom I have spoken with the family and given them the plan.

## 2015-09-03 NOTE — Progress Notes (Signed)
PROGRESS NOTE    Jacob [name]  [id number] DOB: 04/15/1940 DOA: 08/28/2015 PCP: [name], MD   Brief Narrative:  75 y.o. WM PMHx Anxiety, Lumbar stenosis S/P laminectomy and Discectomy May 2017 by Dr Ashok Pall., COPD, RA, HTN, OSA, CAD native artery S/P CABG 7 vessel, DM Type 2 uncontrolled with complication, DM neuropathy, PMR, CKD, Cancer of kidney, Hepatitis   Presented with worsening encephalopathy for the past 24 hours has been doing worse overall. He was discharged to home on June 8th. On 10th of June he started to get progressively worse. He was seen in office by Dr. Christella Noa and was doing better. But started to threaten family he had psych eval at [facility] and was told to go to PCP. Today they went to ER at [facility] care. He was seen in [place] ED. patient had extensive workup chest x-ray was unremarkable UA was concentrated but no evidence of UTI. CT scan of the head was non-acute. U tox positive for benzos and opioids. Troponin was slightly elevated but no EKG changes suggestive of ischemia. They were planing to do MRI to see what is going on he was given something for anxiety and have been lethargic since. where he have gone imaging of his back which was inconclusive for infection versus surgical changes but one does not appear to be infected. He was noted to have elevated LFTs ultrasound was done showing no evidence of cholecystitis. Creatinine was found to be 1.3 which is above his baseline. Chest x-ray while in the facility was unremarkable. Patient was accepted in transfer to stepdown to [facility] Family reports that he has not been eating and drinking for past 3-4 days. He has not taken much of his home medications he did have a dose of Percocet this morning and Flexeril.   Regarding pertinent Chronic problems: In May patient had a fall in the bathtub MRI of the back show large herniated nucleus pulposus at L3-4 with severe lumbar stenosis. He has undergone lumbar  laminectomy discectomy at the level L3/4 by Dr. Christella Noa In May this was complicated by pneumonia acute renal failure she was discharged to nursing home [facility] place and 26th of May Blood pressure at baseline controlled with amlodipine and Lopressor While in the nursing home he finished Augmentin for his age Pneumonia She has history of polymyalgia rheumatica for which she takes prednisone 7 mg daily Last echogram done in May 2017 showing preserved EF no evidence of diastolic dysfunction   Assessment & Plan:   Principal Problem:   Adjustment disorder with mixed emotional features Active Problems:   CAD (coronary artery disease)   Diabetes mellitus, type 2 (HCC)   Neuropathy (HCC)   Rheumatoid arthritis ([place])   CHF with left ventricular diastolic dysfunction, NYHA class 2 (HCC)   OSA (obstructive sleep apnea)   Altered mental status   History of COPD   Spinal stenosis of lumbar region with radiculopathy   Renal cancer ([place])   Acute encephalopathy   Elevated troponin   Elevated LFTs   CAD in native artery   Uncontrolled type 2 diabetes mellitus with complication (HCC)   Rheumatoid arthritis involving vertebra with positive rheumatoid factor ([place])   Spinal stenosis of lumbar region   Controlled diabetes mellitus type 2 with complications ([place])   Anorexia   Acute encephalopathy  - the setting of dehydration and polypharmacy.  -Resolved; occasional waxing and waning -Care when increasing narcotics and other sedating medication  -Normal saline 76ml/hrs   Depression with threatening  violence against family and self -Most likely secondary to intractable back pain and recent surgery. Per family very depressed about current condition especially with wife also being ill. -Trazodone 100 mg QHS per psychiatry  Anxiety  CAD (coronary artery disease)native artery  -Metoprolol 100 mg BID -Labetalol IV PRN   CHF with left ventricular diastolic dysfunction, NYHA class 2  - last  echogram was done in May 2017 show no evidence of CHF. -repeat Echocardiogram; essentially normal see results below   -strict in and out since admission + 1.0 L -Daily weight Filed Weights   09/01/15 0400 09/02/15 0500 09/03/15 0436  Weight: 97.7 kg (215 lb 6.2 oz) 97.523 kg (215 lb) 97.523 kg (215 lb)   OSA (obstructive sleep apnea)  -patient states has CPAP machine at home but unable to use secondary to claustrophobia. -Titrate O2 to maintain SPO2> 93%   Rheumatoid arthritis  -prednisone 7 mg daily  Spinal stenosis of lumbar region with radiculopathy status post laminectomy  -questionable changes on MRI. Infection vs expected post surgical changes? Will await neurosurgery input. -patient stated had extreme pain at home currently asymptomatic -6/16 spoke with Dr.Benjamin [facility] neurosurgery. After reviewing case he also concerned that could be early infection. He Spoke with family offered to perform surgery to ensure no infection, however patient and family declined. Did agree if necessary would allow needle aspiration of fluid. -Requested IR aspirate fluid for culture -spoke with Dr. Carlyle Basques ID concurs with plan recommend starting patient on ceftriaxone and vancomycin once aspirate obtained -Per ID 6 weeks antibiotics. PICC line placed -Air mattress ensure on rotation at all times -Dr Ashok Pall.[facility], To perform surgery on Friday 6/23  Renal cancer  -S/P Ablation, stable   CKD (baseline Cr~1.49-2.0) Lab Results  Component Value Date   CREATININE 1.26* 09/03/2015   CREATININE 1.31* 09/02/2015   CREATININE 1.14 09/01/2015  -At baseline  Elevated troponin  -asymptomatic most likely secondary to demand ischemia.   Diabetes mellitus type 2 controlled with complications -[id number] hemoglobin A1c= 6.8 -Decrease Lantus 6 units daily -Resistant SSI  Elevated LFTs - outside hospital. Obtained ultrasound that showed no evidence of gallstone  disease or cholecystitis possible dehydration related  Hypokalemia -Potassium goal> 4  Hypomagnesemia -Magnesium goal> 2  Pain management  -Methocarbamol 500 mg TID -Morphine PRN  Anorexia -Megace 800 mg daily   Goals of care -Patient has requested that we have a family meeting to discuss short-term/long-term goals of care on Sunday 6/18 at 0900     DVT prophylaxis: SCD Code Status: Full Family Communication; wife and granddaughter present Disposition Plan: resolution of acute encephalopathy//increased  L-spine pain    Consultants:  Dr. Kevan Ny Ditty Neurosurgery   Dr. Carlyle Basques ID Dr. Corena Pilgrim, psychiatry   Procedures/Significant Events:  5/16 LUMBAR LAMINECTOMY DISCECTOMY L3/4 bilateral 6/15 MRI brain;negative acute CVA-mild to moderate  Chronic small vessel ischemic disease 6/15 L spine W/WO contrast;-Postoperative changes at the L3-L4 level with superimposed large cephalad disc extrusion causing severe spinal stenosis, as well as abnormal fluid signal in the L3-L4 disc space associated with mild L4 superior endplate fracture with L3 and L4 endplate edema/inflammation.  -Mild associated psoas muscle inflammation. -Discitis/ osteomyelitis at L3-L4?? -Small postoperative fluid collection overlying the L3 spinous process, favor seroma. 6/15 echocardiogram;Left ventricle: mildly dilated.-LVEF= 55% - 60%.- (grade 1 diastolic dysfunction).   Cultures 6/16 blood right AC 2 NGTD pending  6/16 L-spine aspirated fluid NGTD    Antimicrobials: Ceftriaxone 6/16>> Vancomycin  6/16>>   Devices    LINES / TUBES:  PIC midline 6/17>>    Continuous Infusions: . sodium chloride 50 mL/hr at 09/03/15 1700     Subjective: 6/20 A/O 4, family at bedside all have spoken with neurosurgeon today and our agreement to proceed with surgery on Friday     Objective: Filed Vitals:   09/03/15 1213 09/03/15 1600 09/03/15 1934 09/03/15 1959  BP:  139/81 156/80 151/83   Pulse: 82 89    Temp: 98.4 F (36.9 C) 97.7 F (36.5 C) 97.8 F (36.6 C)   TempSrc: Oral Oral Oral   Resp: 18 18 17    Height:      Weight:      SpO2: 92% 96% 94% 95%    Intake/Output Summary (Last 24 hours) at 09/03/15 2053 Last data filed at 09/03/15 1700  Gross per 24 hour  Intake   1640 ml  Output    800 ml  Net    840 ml   Filed Weights   09/01/15 0400 09/02/15 0500 09/03/15 0436  Weight: 97.7 kg (215 lb 6.2 oz) 97.523 kg (215 lb) 97.523 kg (215 lb)    Examination:  General:A/O 4, follows all commands, No acute respiratory distress Eyes: negative scleral hemorrhage, negative anisocoria, negative icterus ENT: Negative Runny nose, negative gingival bleeding, Neck:  Negative scars, masses, torticollis, lymphadenopathy, JVD Lungs: Clear to auscultation bilaterally without wheezes or crackles Cardiovascular: Regular rate and rhythm without murmur gallop or rub normal S1 and S2 Abdomen: negative abdominal pain, nondistended, positive soft, bowel sounds, no rebound, no ascites, no appreciable mass Extremities: No significant cyanosis, clubbing, or edema bilateral lower extremities Skin: well-healed incision medial aspect of LLE Psychiatric:  Negative depression, negative anxiety, negative fatigue, negative mania  Central nervous system:  Cranial nerves II through XII intact, tongue/uvula midline, all extremities muscle strength 5/5, sensation intact throughout,negative dysarthria, negative expressive aphasia, negative receptive aphasia.      Data Reviewed: Care during the described time interval was provided by me .  I have reviewed this patient's available data, including medical history, events of note, physical examination, and all test results as part of my evaluation. I have personally reviewed and interpreted all radiology studies.  CBC:  Recent Labs Lab 08/28/15 2312  08/30/15 0310 08/31/15 0315 09/01/15 0412 09/02/15 0644  09/03/15 0245  WBC 6.4  < > 6.0 8.8 6.6 8.1 9.2  NEUTROABS 3.8  --   --   --   --   --   --   HGB 11.8*  < > 10.9* 10.2* 10.6* 11.4* 11.9*  HCT 37.5*  < > 34.8* 32.1* 34.0* 36.1* 37.4*  MCV 95.2  < > 93.3 93.6 93.2 93.0 94.0  PLT 254  < > 248 244 250 268 270  < > = values in this interval not displayed. Basic Metabolic Panel:  Recent Labs Lab 08/29/15 1034 08/30/15 0310 08/31/15 0315 09/01/15 0412 09/01/15 2221 09/02/15 0644 09/03/15 0245  NA 140 140 141 140  --  138 138  K 3.5 3.5 3.5 2.8* 3.2* 2.9* 4.0  CL 106 106 109 104  --  103 104  CO2 25 26 27 28   --  28 26  GLUCOSE 184* 228* 163* 60*  --  90 164*  BUN 12 16 16 10   --  8 10  CREATININE 1.44* 1.35* 1.29* 1.14  --  1.31* 1.26*  CALCIUM 9.0 9.1 9.0 9.2  --  9.3 9.3  MG 1.6* 1.7 1.7 1.6*  1.7 1.6* 2.1  PHOS 3.8  --   --   --   --   --   --    GFR: Estimated Creatinine Clearance: 60.3 mL/min (by C-G formula based on Cr of 1.26). Liver Function Tests:  Recent Labs Lab 08/29/15 1034 08/30/15 0310 08/31/15 0315 09/01/15 0412 09/02/15 0644  AST 31 25 27 29  38  ALT 61 53 44 45 51  ALKPHOS 407* 384* 341* 355* 348*  BILITOT 1.2 0.8 0.6 0.7 0.7  PROT 5.9* 5.8* 5.5* 5.8* 6.2*  ALBUMIN 2.6* 2.4* 2.4* 2.7* 2.8*   No results for input(s): LIPASE, AMYLASE in the last 168 hours.  Recent Labs Lab 08/28/15 2313  AMMONIA 28   Coagulation Profile:  Recent Labs Lab 08/30/15 1317  INR 1.02   Cardiac Enzymes:  Recent Labs Lab 08/28/15 2312 08/29/15 1034 08/30/15 0310  TROPONINI 0.05* 0.04*  0.04* 0.03   BNP (last 3 results) No results for input(s): PROBNP in the last 8760 hours. HbA1C: No results for input(s): HGBA1C in the last 72 hours. CBG:  Recent Labs Lab 09/03/15 0812 09/03/15 1213 09/03/15 1607 09/03/15 1732 09/03/15 1936  GLUCAP 160* 196* 159* 168* 189*   Lipid Profile: No results for input(s): CHOL, HDL, LDLCALC, TRIG, CHOLHDL, LDLDIRECT in the last 72 hours. Thyroid Function Tests: No  results for input(s): TSH, T4TOTAL, FREET4, T3FREE, THYROIDAB in the last 72 hours. Anemia Panel:  Recent Labs  09/02/15 1416  VITAMINB12 941*  FOLATE 18.6   Urine analysis:    Component Value Date/Time   BILIRUBINUR neg 09/09/2013 1233   PROTEINUR >=300 09/09/2013 1233   UROBILINOGEN 0.2 09/09/2013 1233   NITRITE neg 09/09/2013 1233   LEUKOCYTESUR Negative 09/09/2013 1233   Sepsis Labs: @LABRCNTIP (procalcitonin:4,lacticidven:4)  ) Recent Results (from the past 240 hour(s))  MRSA PCR Screening     Status: None   Collection Time: 08/29/15  2:20 PM  Result Value Ref Range Status   MRSA by PCR NEGATIVE NEGATIVE Final    Comment:        The GeneXpert MRSA Assay (FDA approved for NASAL specimens only), is one component of a comprehensive MRSA colonization surveillance program. It is not intended to diagnose MRSA infection nor to guide or monitor treatment for MRSA infections.   Culture, blood (routine x 2)     Status: None (Preliminary result)   Collection Time: 08/30/15  1:25 PM  Result Value Ref Range Status   Specimen Description BLOOD RIGHT ANTECUBITAL  Final   Special Requests IN PEDIATRIC BOTTLE 2CC  Final   Culture NO GROWTH 4 DAYS  Final   Report Status PENDING  Incomplete  Culture, blood (routine x 2)     Status: None (Preliminary result)   Collection Time: 08/30/15  1:30 PM  Result Value Ref Range Status   Specimen Description BLOOD RIGHT ANTECUBITAL  Final   Special Requests IN PEDIATRIC BOTTLE 3CC  Final   Culture NO GROWTH 4 DAYS  Final   Report Status PENDING  Incomplete  Body fluid culture     Status: None (Preliminary result)   Collection Time: 08/30/15  5:17 PM  Result Value Ref Range Status   Specimen Description FLUID  Final   Special Requests INTERVERTEBRAL DISC  Final   Gram Stain   Final    RARE WBC PRESENT,BOTH PMN AND MONONUCLEAR NO ORGANISMS SEEN    Culture NO GROWTH 4 DAYS  Final   Report Status PENDING  Incomplete  Radiology Studies: No results found.      Scheduled Meds: . allopurinol  300 mg Oral Q lunch  . amLODipine  10 mg Oral Q supper  . antiseptic oral rinse  7 mL Mouth Rinse BID  . aspirin EC  81 mg Oral Q lunch  . atorvastatin  20 mg Oral q1800  . calcitRIOL  0.25 mcg Oral Q M,W,F  . cefTRIAXone (ROCEPHIN)  IV  2 g Intravenous Q24H  . cycloSPORINE  1 drop Both Eyes BID  . feeding supplement (GLUCERNA SHAKE)  237 mL Oral TID BM  . finasteride  5 mg Oral QHS  . insulin aspart  0-20 Units Subcutaneous Q4H  . insulin glargine  6 Units Subcutaneous QHS  . megestrol  800 mg Oral Daily  . metoprolol  100 mg Oral BID  . mometasone-formoterol  2 puff Inhalation BID  . montelukast  10 mg Oral QHS  . pilocarpine  1 drop Both Eyes TID  . polyethylene glycol  17 g Oral BID  . predniSONE  7 mg Oral Q breakfast  . senna-docusate  2 tablet Oral BID  . sodium chloride flush  10-40 mL Intracatheter Q12H  . tamsulosin  0.4 mg Oral QHS  . traZODone  100 mg Oral QHS  . vancomycin  750 mg Intravenous Q12H   Continuous Infusions: . sodium chloride 50 mL/hr at 09/03/15 1700     LOS: 6 days    Time spent: 40 minutes    WOODS, Geraldo Docker, MD [facility] Pager [phone]   If 7PM-7AM, please contact night-coverage [email or web address] Password [facility] 09/03/2015, 8:53 PM

## 2015-09-03 NOTE — Clinical Social Work Note (Addendum)
CSW received consult for patient needing SNF for IV antbiotics..  CSW contacted patient's daughter to discuss SNF placement, patient's daughter is in agreement to going to SNF again once he is medically ready for discharge.  CSW to work on finding placement for patient in [facility].  CSW notified bedside nurse to see if she can request PT and OT orders, CSW to continue to follow patient's progress.  Jones Broom. [place], MSW, [place] 09/03/2015 11:55 AM

## 2015-09-04 DIAGNOSIS — E118 Type 2 diabetes mellitus with unspecified complications: Secondary | ICD-10-CM

## 2015-09-04 DIAGNOSIS — G9340 Encephalopathy, unspecified: Secondary | ICD-10-CM

## 2015-09-04 DIAGNOSIS — R7989 Other specified abnormal findings of blood chemistry: Secondary | ICD-10-CM | POA: Diagnosis not present

## 2015-09-04 DIAGNOSIS — F4323 Adjustment disorder with mixed anxiety and depressed mood: Secondary | ICD-10-CM | POA: Diagnosis not present

## 2015-09-04 DIAGNOSIS — I2510 Atherosclerotic heart disease of native coronary artery without angina pectoris: Secondary | ICD-10-CM | POA: Diagnosis not present

## 2015-09-04 DIAGNOSIS — M4806 Spinal stenosis, lumbar region: Secondary | ICD-10-CM

## 2015-09-04 DIAGNOSIS — Z794 Long term (current) use of insulin: Secondary | ICD-10-CM

## 2015-09-04 DIAGNOSIS — M4646 Discitis, unspecified, lumbar region: Secondary | ICD-10-CM | POA: Diagnosis not present

## 2015-09-04 DIAGNOSIS — M4647 Discitis, unspecified, lumbosacral region: Secondary | ICD-10-CM | POA: Diagnosis not present

## 2015-09-04 DIAGNOSIS — M4640 Discitis, unspecified, site unspecified: Secondary | ICD-10-CM | POA: Insufficient documentation

## 2015-09-04 LAB — GLUCOSE, CAPILLARY
Glucose-Capillary: 106 mg/dL — ABNORMAL HIGH (ref 65–99)
Glucose-Capillary: 126 mg/dL — ABNORMAL HIGH (ref 65–99)
Glucose-Capillary: 206 mg/dL — ABNORMAL HIGH (ref 65–99)
Glucose-Capillary: 219 mg/dL — ABNORMAL HIGH (ref 65–99)
Glucose-Capillary: 223 mg/dL — ABNORMAL HIGH (ref 65–99)
Glucose-Capillary: 239 mg/dL — ABNORMAL HIGH (ref 65–99)

## 2015-09-04 LAB — CULTURE, BLOOD (ROUTINE X 2)
Culture: NO GROWTH
Culture: NO GROWTH

## 2015-09-04 LAB — BODY FLUID CULTURE: Culture: NO GROWTH

## 2015-09-04 LAB — MAGNESIUM: Magnesium: 2 mg/dL (ref 1.7–2.4)

## 2015-09-04 MED ORDER — INSULIN ASPART 100 UNIT/ML ~~LOC~~ SOLN
0.0000 [IU] | Freq: Three times a day (TID) | SUBCUTANEOUS | Status: DC
  Administered 2015-09-04 – 2015-09-05 (×2): 7 [IU] via SUBCUTANEOUS
  Administered 2015-09-05 (×2): 4 [IU] via SUBCUTANEOUS
  Administered 2015-09-06 (×2): 3 [IU] via SUBCUTANEOUS
  Administered 2015-09-06: 4 [IU] via SUBCUTANEOUS
  Administered 2015-09-07: 3 [IU] via SUBCUTANEOUS
  Administered 2015-09-07 – 2015-09-08 (×3): 4 [IU] via SUBCUTANEOUS
  Administered 2015-09-08: 7 [IU] via SUBCUTANEOUS
  Administered 2015-09-08 – 2015-09-09 (×2): 4 [IU] via SUBCUTANEOUS
  Administered 2015-09-09: 3 [IU] via SUBCUTANEOUS

## 2015-09-04 MED ORDER — INSULIN ASPART 100 UNIT/ML ~~LOC~~ SOLN
0.0000 [IU] | Freq: Every day | SUBCUTANEOUS | Status: DC
  Administered 2015-09-04: 2 [IU] via SUBCUTANEOUS
  Administered 2015-09-07: 3 [IU] via SUBCUTANEOUS

## 2015-09-04 NOTE — Progress Notes (Signed)
Patient ID: GRAYDEN HOLMS, male   DOB: 01-21-1941, 75 y.o.   MRN: [id number] BP 137/75 mmHg  Pulse 81  Temp(Src) 97.6 F (36.4 C) (Oral)  Resp 18  Ht 5\' 11"  (1.803 m)  Wt 94.348 kg (208 lb)  BMI 29.02 kg/m2  SpO2 98% Plan for or on this Friday.  Moving all extremities Confused, fluent Follows commands

## 2015-09-04 NOTE — Progress Notes (Addendum)
PROGRESS NOTE  Jacob [name] [id number] DOB: March 07, 1941 DOA: 08/28/2015 PCP: [name], MD  Brief History:  75 y.o. WM PMHx Anxiety, Lumbar stenosis S/P laminectomy and Discectomy May 2017 by Dr Ashok Pall., COPD, RA, HTN, OSA, CAD native artery S/P CABG 7 vessel, DM Type 2 uncontrolled with complication, DM neuropathy, PMR, CKD, Cancer of kidney, Hepatitis   Presented with worsening encephalopathy for 24 hours  He was discharged to home on June 8th. On 10th of June he started to get progressively worse. He was seen in office by Dr. Christella Noa and was doing better. But he started to threaten family he had psych eval at [name] and was told to go to PCP. He went to ER at [name]. He was seen in [name]. patient had extensive workup chest x-ray was unremarkable UA was concentrated but no evidence of UTI. CT scan of the head was non-acute. U tox positive for benzos and opioids. Troponin was slightly elevated but no EKG changes suggestive of ischemia. MRI was planned. The patient was given an anxiolytic agent. Unfortunately, the patient became lethargic after the anxiolytic agent.MRI of the lumbar spine showed postoperative changes at L3-4 with superimposed cephalad disc extrusion causing severe spinal stenosis as well as abnormal fluid signal and L3-4 disc space. There was concern that this may represent early discitis/osteomyelitis. He was noted to have elevated LFTs ultrasound was done showing no evidence of cholecystitis.  Patient was accepted in transfer to stepdown to [name] reports that he has not been eating and drinking for past 3-4 days PTA. He has not taken much of his home medications he did have a dose of Percocet this morning and Flexeril.   Regarding pertinent Chronic problems: In May patient had a fall in the bathtub MRI of the back show large herniated nucleus pulposus at L3-4 with severe lumbar stenosis. He has undergone lumbar laminectomy discectomy  at the level L3/4 by Dr. Christella Noa In May this was complicated by pneumonia acute renal failure she was discharged to nursing home [name] place and 26th of May Blood pressure at baseline controlled with amlodipine and Lopressor While in the nursing home he finished Augmentin for his age Pneumonia She has history of polymyalgia rheumatica for which she takes prednisone 7 mg daily Last echogram done in May 2017 showing preserved EF no evidence of diastolic dysfunction  Assessment/Plan: Acute encephalopathy  - the setting of dehydration and polypharmacy.  -Resolved; occasional waxing and waning -Care when increasing narcotics and other sedating medication  -Normal saline 51ml/hrs  -d/c megace  Depression with threatening violence against family and self -Most likely secondary to intractable back pain and recent surgery. Per family very depressed about current condition especially with wife also being ill. -Trazodone 100 mg QHS per psychiatry  CAD (coronary artery disease)native artery  -Metoprolol 100 mg BID -Labetalol IV PRN  -no chest pain presently  CHF with left ventricular diastolic dysfunction, NYHA class 2  - last echogram was done in May 2017 show no evidence of CHF. -repeat Echocardiogram; essentially normal see results below  -strict in and out since admission + 1.0 L -Daily weight -08/29/2015 echo EF 55-60%, grade 1 DD   Polymyalgia rheumatica -prednisone 7 mg daily  Spinal stenosis of lumbar region with radiculopathy status post laminectomy  -questionable changes on MRI. Infection vs expected post surgical changes? -patient stated had extreme pain at home was asymptomatic -6/16 spoke with Dr[facility] neurosurgery.  After reviewing case he also concerned that could be early infection. He Spoke with family offered to perform surgery to ensure no infection, however patient and family declined. Did agree if necessary would allow needle aspiration of  fluid. -Requested IR aspirate fluid for culture -spoke with Dr. Carlyle Basques ID concurs with plan recommend starting patient on ceftriaxone and vancomycin once aspirate obtained -Per ID 6 weeks antibiotics. PICC line placed -Air mattress ensure on rotation at all times -Dr Ashok Pall.To perform surgery on Friday 6/23  Renal cancer  -S/P Ablation, stable       CKD stage II-III -Baseline creatinine 1.1-1.4  Elevated troponin  -asymptomatic most likely secondary to demand ischemia.   Diabetes mellitus type 2 controlled with complications -[id number] hemoglobin A1c= 6.8 -Decrease Lantus 6 units daily -Resistant SSI  Elevated LFTs - outside hospital. Obtained ultrasound that showed no evidence of gallstone disease or cholecystitis possible dehydration related  Hypokalemia -Potassium goal> 4  Hypomagnesemia -Magnesium goal> 2  Pain management  -Methocarbamol 500 mg TID -Morphine PRN        Disposition Plan:   Not stable for d/c  Family Communication:   No Family at bedside   Consultants:  ID, Neurosurgery, Dr. Christella Noa  Code Status:  FULL  DVT Prophylaxis:  SCDs   Procedures: As Listed in Progress Note Above  Antibiotics: None    Subjective: Patient denies fevers, chills, headache, chest pain, dyspnea, nausea, vomiting, diarrhea, abdominal pain, dysuria, hematuria   Objective: Filed Vitals:   09/04/15 0536 09/04/15 0900 09/04/15 1030 09/04/15 1400  BP: 173/84 162/81  137/75  Pulse: 85 91  81  Temp: 98 F (36.7 C) 97.8 F (36.6 C)  97.6 F (36.4 C)  TempSrc: Oral Oral  Oral  Resp: 18 20  18   Height:      Weight:      SpO2: 99% 99% 98% 98%    Intake/Output Summary (Last 24 hours) at 09/04/15 1627 Last data filed at 09/04/15 0733  Gross per 24 hour  Intake   1237 ml  Output    950 ml  Net    287 ml   Weight change: 0.907 kg (2 lb) Exam:   General:  Pt is alert, follows commands appropriately, not in acute distress  HEENT: No icterus, No  thrush, No neck mass, [place]/AT  Cardiovascular: RRR, S1/S2, no rubs, no gallops  Respiratory: CTA bilaterally, no wheezing, no crackles, no rhonchi  Abdomen: Soft/+BS, non tender, non distended, no guarding  Extremities: No edema, No lymphangitis, No petechiae, No rashes, no synovitis   Data Reviewed: I have personally reviewed following labs and imaging studies Basic Metabolic Panel:  Recent Labs Lab 08/29/15 1034 08/30/15 0310 08/31/15 0315 09/01/15 0412 09/01/15 2221 09/02/15 0644 09/03/15 0245 09/04/15 0540  NA 140 140 141 140  --  138 138  --   K 3.5 3.5 3.5 2.8* 3.2* 2.9* 4.0  --   CL 106 106 109 104  --  103 104  --   CO2 25 26 27 28   --  28 26  --   GLUCOSE 184* 228* 163* 60*  --  90 164*  --   BUN 12 16 16 10   --  8 10  --   CREATININE 1.44* 1.35* 1.29* 1.14  --  1.31* 1.26*  --   CALCIUM 9.0 9.1 9.0 9.2  --  9.3 9.3  --   MG 1.6* 1.7 1.7 1.6* 1.7 1.6* 2.1 2.0  PHOS 3.8  --   --   --   --   --   --   --  Liver Function Tests:  Recent Labs Lab 08/29/15 1034 08/30/15 0310 08/31/15 0315 09/01/15 0412 09/02/15 0644  AST 31 25 27 29  38  ALT 61 53 44 45 51  ALKPHOS 407* 384* 341* 355* 348*  BILITOT 1.2 0.8 0.6 0.7 0.7  PROT 5.9* 5.8* 5.5* 5.8* 6.2*  ALBUMIN 2.6* 2.4* 2.4* 2.7* 2.8*   No results for input(s): LIPASE, AMYLASE in the last 168 hours.  Recent Labs Lab 08/28/15 2313  AMMONIA 28   Coagulation Profile:  Recent Labs Lab 08/30/15 1317  INR 1.02   CBC:  Recent Labs Lab 08/28/15 2312  08/30/15 0310 08/31/15 0315 09/01/15 0412 09/02/15 0644 09/03/15 0245  WBC 6.4  < > 6.0 8.8 6.6 8.1 9.2  NEUTROABS 3.8  --   --   --   --   --   --   HGB 11.8*  < > 10.9* 10.2* 10.6* 11.4* 11.9*  HCT 37.5*  < > 34.8* 32.1* 34.0* 36.1* 37.4*  MCV 95.2  < > 93.3 93.6 93.2 93.0 94.0  PLT 254  < > 248 244 250 268 270  < > = values in this interval not displayed. Cardiac Enzymes:  Recent Labs Lab 08/28/15 2312 08/29/15 1034 08/30/15 0310    TROPONINI 0.05* 0.04*  0.04* 0.03   BNP: Invalid input(s): POCBNP CBG:  Recent Labs Lab 09/03/15 1936 09/04/15 0034 09/04/15 0337 09/04/15 0723 09/04/15 1144  GLUCAP 189* 223* 106* 126* 219*   HbA1C: No results for input(s): HGBA1C in the last 72 hours. Urine analysis:    Component Value Date/Time   BILIRUBINUR neg 09/09/2013 1233   PROTEINUR >=300 09/09/2013 1233   UROBILINOGEN 0.2 09/09/2013 1233   NITRITE neg 09/09/2013 1233   LEUKOCYTESUR Negative 09/09/2013 1233   Sepsis Labs: @LABRCNTIP (procalcitonin:4,lacticidven:4) ) Recent Results (from the past 240 hour(s))  MRSA PCR Screening     Status: None   Collection Time: 08/29/15  2:20 PM  Result Value Ref Range Status   MRSA by PCR NEGATIVE NEGATIVE Final    Comment:        The GeneXpert MRSA Assay (FDA approved for NASAL specimens only), is one component of a comprehensive MRSA colonization surveillance program. It is not intended to diagnose MRSA infection nor to guide or monitor treatment for MRSA infections.   Culture, blood (routine x 2)     Status: None   Collection Time: 08/30/15  1:25 PM  Result Value Ref Range Status   Specimen Description BLOOD RIGHT ANTECUBITAL  Final   Special Requests IN PEDIATRIC BOTTLE 2CC  Final   Culture NO GROWTH 5 DAYS  Final   Report Status 09/04/2015 FINAL  Final  Culture, blood (routine x 2)     Status: None   Collection Time: 08/30/15  1:30 PM  Result Value Ref Range Status   Specimen Description BLOOD RIGHT ANTECUBITAL  Final   Special Requests IN PEDIATRIC BOTTLE 3CC  Final   Culture NO GROWTH 5 DAYS  Final   Report Status 09/04/2015 FINAL  Final  Body fluid culture     Status: None   Collection Time: 08/30/15  5:17 PM  Result Value Ref Range Status   Specimen Description FLUID  Final   Special Requests INTERVERTEBRAL DISC  Final   Gram Stain   Final    RARE WBC PRESENT,BOTH PMN AND MONONUCLEAR NO ORGANISMS SEEN    Culture No growth aerobically or  anaerobically.  Final   Report Status 09/04/2015 FINAL  Final  Scheduled Meds: . allopurinol  300 mg Oral Q lunch  . amLODipine  10 mg Oral Q supper  . antiseptic oral rinse  7 mL Mouth Rinse BID  . aspirin EC  81 mg Oral Q lunch  . atorvastatin  20 mg Oral q1800  . calcitRIOL  0.25 mcg Oral Q M,W,F  . cefTRIAXone (ROCEPHIN)  IV  2 g Intravenous Q24H  . cycloSPORINE  1 drop Both Eyes BID  . feeding supplement (GLUCERNA SHAKE)  237 mL Oral TID BM  . finasteride  5 mg Oral QHS  . insulin aspart  0-20 Units Subcutaneous Q4H  . insulin glargine  6 Units Subcutaneous QHS  . megestrol  800 mg Oral Daily  . metoprolol  100 mg Oral BID  . mometasone-formoterol  2 puff Inhalation BID  . montelukast  10 mg Oral QHS  . pilocarpine  1 drop Both Eyes TID  . polyethylene glycol  17 g Oral BID  . predniSONE  7 mg Oral Q breakfast  . senna-docusate  2 tablet Oral BID  . sodium chloride flush  10-40 mL Intracatheter Q12H  . tamsulosin  0.4 mg Oral QHS  . traZODone  100 mg Oral QHS  . vancomycin  750 mg Intravenous Q12H   Continuous Infusions: . sodium chloride 50 mL/hr at 09/04/15 0050    Procedures/Studies: Mr Brain Wo Contrast  08/29/2015  CLINICAL DATA:  Altered mental status. Patient threatening himself and others. Lethargy. Evaluation for stroke. EXAM: MRI HEAD WITHOUT CONTRAST TECHNIQUE: Multiplanar, multiecho pulse sequences of the brain and surrounding structures were obtained without intravenous contrast. COMPARISON:  08/03/2015 head CT FINDINGS: The study is mildly motion degraded. There is no evidence of acute infarct, intracranial hemorrhage, mass, midline shift, or extra-axial fluid collection. Cerebral atrophy is within normal limits for age. Patchy cerebral white matter T2 hyperintensities are nonspecific but compatible with mild-to-moderate chronic small vessel ischemic disease. Prior bilateral cataract extraction is noted. Paranasal sinuses and mastoid air cells are clear.  Major intracranial vascular flow voids are preserved. IMPRESSION: 1. No acute intracranial abnormality. 2. Mild-to-moderate chronic small vessel ischemic disease. Electronically Signed   By: Logan Bores M.D.   On: 08/29/2015 13:03   Mr Lumbar Spine W Wo Contrast  08/30/2015  ADDENDUM REPORT: 08/30/2015 09:27 ADDENDUM: Study discussed by telephone with Imelda Pillow, Medical Assitant for Dr. Ashok Pall on 08/30/2015 at 0912 hours. Electronically Signed   By: Genevie Ann M.D.   On: 08/30/2015 09:27  08/30/2015  CLINICAL DATA:  75 year old male with lumbar back pain radiating to the left lower extremity status post lumbar spine surgery 07/30/2015. Subsequent encounter. EXAM: MRI LUMBAR SPINE WITHOUT AND WITH CONTRAST TECHNIQUE: Multiplanar and multiecho pulse sequences of the lumbar spine were obtained without and with intravenous contrast. CONTRAST:  63mL MULTIHANCE GADOBENATE DIMEGLUMINE 529 MG/ML IV SOLN COMPARISON:  Outside preoperative lumbar MRI 07/24/2015. Intraoperative radiographs 07/30/2015. FINDINGS: Segmentation:  Normal as seen on the intraoperative radiographs. Alignment: Interval mild L4 superior endplate deformity with mild (5-10%) loss of L4 vertebral body height. Associated mild endplate edema there are and at the adjacent L3 inferior endplate. Postoperative changes at this level further described below. Stable vertebral height and alignment elsewhere. Vertebrae: Endplate marrow edema at L3 and L4 as stated. Stable and normal bone marrow signal elsewhere. Conus medullaris: Extends to the L1-L2 level and appears normal. No lower thoracic spinal cord signal abnormality. Paraspinal and other soft tissues: Postoperative changes in the posterior paraspinal soft tissues from the L2 spinous  process level to the L4 spinous process level. Medial bilateral erector spinae muscle edema and enhancement. Enhancement tracks from the skin surface to the epidural space at L3-L4. There is a a small 15 mm fluid  collection overlying the L3 spinous process. See also L3-L4 disc space findings below. No definite prevertebral soft tissue inflammation. There is mild right greater than left medial psoas muscle inflammation and enhancement (series 6, image 1). Disc levels: The disc space levels L1-L2 and above are stable. The L4-L5 and L5-S1 levels are stable. L2-L3:  Stable. L3-L4: Large cephalad disc extrusion with disc fragment encompassing 7 x 11 x 24 mm (AP by transverse by CC). See series 9, image 12 and series 4, image 8. The L3-L4 disc space now appears to be fluid-filled. The endplates are mildly edematous and enhancing. Moderate facet and ligament flavum hypertrophy Re demonstrated. Postoperative changes possibly to the ligament flavum. Subsequent severe spinal stenosis (series 9, image 14). IMPRESSION: 1. Postoperative changes at the L3-L4 level with superimposed large cephalad disc extrusion (series 4, image 8) causing severe spinal stenosis, as well as abnormal fluid signal in the L3-L4 disc space associated with mild L4 superior endplate fracture with L3 and L4 endplate edema/inflammation. Mild associated psoas muscle inflammation. 2. The appearance is such that discitis osteomyelitis at L3-L4 cannot be excluded. 3. There is also a small postoperative fluid collection overlying the L3 spinous process, favor seroma. Electronically Signed: By: Genevie Ann M.D. On: 08/29/2015 13:39   Ir Fluoro Guide Ndl Plmt / Bx  09/04/2015  INDICATION: Lumbar discitis. EXAM: IR FLUORO GUIDE NEEDLE PLACEMENT /BIOPSY AT L3-L4 MEDICATIONS: Versed 2 mg IV.  Fentanyl 50 mcg IV. ANESTHESIA/SEDATION: Moderate (conscious) sedation was employed during this procedure. A total of Versed 2 mg and Fentanyl 50 mcg was administered intravenously. Moderate Sedation Time: 13 minutes. The patient's level of consciousness and vital signs were monitored continuously by radiology nursing throughout the procedure under my direct supervision. FLUOROSCOPY  TIME:  Fluoroscopy Time: 2 minutes 36 seconds (207 mGy). COMPLICATIONS: None immediate. PROCEDURE: Informed written consent was obtained from the patient after a thorough discussion of the procedural risks, benefits and alternatives. All questions were addressed. Maximal Sterile Barrier Technique was utilized including caps, mask, sterile gowns, sterile gloves, sterile drape, hand hygiene and skin antiseptic. A timeout was performed prior to the initiation of the procedure. The patient was laid prone on the fluoroscopic table. The skin overlying the lumbar region was then prepped and draped in the usual sterile fashion. Skin entry site at the L3-L4 paraspinous region was then infiltrated with 0.25% bupivacaine and carried into the paraspinous muscles. Thereafter, using biplane intermittent fluoroscopy, a 21 gauge Francine biopsy needle was then advanced into the L3-L4 disc space without difficulty. Using a 20 mL syringe, a fleck of bloody aspirate was obtained with two passes of two separate 21 gauge Francine needles. Approximately 1.5 cc of aspirate was obtained in total. Hemostasis was achieved at the skin entry site. The specimens were then sent for microbiology testing as per request. The patient tolerated the procedure well with no acute complications. IMPRESSION: Status post fluoroscopic guided needle placement with aspiration of the L3-L4 disc space retrieving a total of 1.5 cc of thick bloody aspirate. Electronically Signed   By: Luanne Bras M.D.   On: 08/30/2015 15:23    Shar Paez, DO  [facility] Pager [phone]  If 7PM-7AM, please contact night-coverage [email or web address] Password [email or web address] 09/04/2015, 4:27 PM   LOS: 7 days

## 2015-09-05 DIAGNOSIS — M4646 Discitis, unspecified, lumbar region: Secondary | ICD-10-CM | POA: Diagnosis not present

## 2015-09-05 DIAGNOSIS — M4647 Discitis, unspecified, lumbosacral region: Secondary | ICD-10-CM

## 2015-09-05 DIAGNOSIS — I2510 Atherosclerotic heart disease of native coronary artery without angina pectoris: Secondary | ICD-10-CM | POA: Diagnosis not present

## 2015-09-05 DIAGNOSIS — E118 Type 2 diabetes mellitus with unspecified complications: Secondary | ICD-10-CM | POA: Diagnosis not present

## 2015-09-05 DIAGNOSIS — R7989 Other specified abnormal findings of blood chemistry: Secondary | ICD-10-CM | POA: Diagnosis not present

## 2015-09-05 DIAGNOSIS — M4806 Spinal stenosis, lumbar region: Secondary | ICD-10-CM | POA: Diagnosis not present

## 2015-09-05 DIAGNOSIS — G9340 Encephalopathy, unspecified: Secondary | ICD-10-CM | POA: Diagnosis not present

## 2015-09-05 DIAGNOSIS — F4323 Adjustment disorder with mixed anxiety and depressed mood: Secondary | ICD-10-CM | POA: Diagnosis not present

## 2015-09-05 DIAGNOSIS — E1165 Type 2 diabetes mellitus with hyperglycemia: Secondary | ICD-10-CM

## 2015-09-05 LAB — GLUCOSE, CAPILLARY
Glucose-Capillary: 172 mg/dL — ABNORMAL HIGH (ref 65–99)
Glucose-Capillary: 172 mg/dL — ABNORMAL HIGH (ref 65–99)
Glucose-Capillary: 187 mg/dL — ABNORMAL HIGH (ref 65–99)
Glucose-Capillary: 209 mg/dL — ABNORMAL HIGH (ref 65–99)
Glucose-Capillary: 233 mg/dL — ABNORMAL HIGH (ref 65–99)

## 2015-09-05 LAB — BASIC METABOLIC PANEL
Anion gap: 6 (ref 5–15)
BUN: 14 mg/dL (ref 6–20)
CO2: 27 mmol/L (ref 22–32)
Calcium: 9 mg/dL (ref 8.9–10.3)
Chloride: 104 mmol/L (ref 101–111)
Creatinine, Ser: 1.24 mg/dL (ref 0.61–1.24)
GFR calc Af Amer: >60 mL/min (ref >60)
GFR calc non Af Amer: 55 mL/min — ABNORMAL LOW (ref >60)
Glucose, Bld: 209 mg/dL — ABNORMAL HIGH (ref 65–99)
Potassium: 3.6 mmol/L (ref 3.5–5.1)
Sodium: 137 mmol/L (ref 135–145)

## 2015-09-05 LAB — URINALYSIS, ROUTINE W REFLEX MICROSCOPIC
Bilirubin Urine: NEGATIVE
Glucose, UA: NEGATIVE mg/dL
Hgb urine dipstick: NEGATIVE
Ketones, ur: NEGATIVE mg/dL
Nitrite: NEGATIVE
Protein, ur: >300 mg/dL — AB
Specific Gravity, Urine: 1.015 (ref 1.005–1.030)
pH: 7 (ref 5.0–8.0)

## 2015-09-05 LAB — CBC
HCT: 33.7 % — ABNORMAL LOW (ref 39.0–52.0)
Hemoglobin: 10.8 g/dL — ABNORMAL LOW (ref 13.0–17.0)
MCH: 29.3 pg (ref 26.0–34.0)
MCHC: 32 g/dL (ref 30.0–36.0)
MCV: 91.3 fL (ref 78.0–100.0)
Platelets: 273 10*3/uL (ref 150–400)
RBC: 3.69 MIL/uL — ABNORMAL LOW (ref 4.22–5.81)
RDW: 14.5 % (ref 11.5–15.5)
WBC: 7.7 10*3/uL (ref 4.0–10.5)

## 2015-09-05 LAB — URINE MICROSCOPIC-ADD ON: RBC / HPF: NONE SEEN RBC/hpf (ref 0–5)

## 2015-09-05 LAB — VANCOMYCIN, TROUGH: Vancomycin Tr: 25 ug/mL — ABNORMAL HIGH (ref 10.0–20.0)

## 2015-09-05 LAB — MAGNESIUM: Magnesium: 1.8 mg/dL (ref 1.7–2.4)

## 2015-09-05 MED ORDER — VANCOMYCIN HCL 500 MG IV SOLR
500.0000 mg | Freq: Two times a day (BID) | INTRAVENOUS | Status: DC
  Administered 2015-09-06 – 2015-09-09 (×6): 500 mg via INTRAVENOUS
  Filled 2015-09-05 (×8): qty 500

## 2015-09-05 NOTE — Progress Notes (Signed)
PROGRESS NOTE  Jacob [name] FUX:[id number] DOB: [birth year] DOA: 08/28/2015 PCP: [name], MD  Brief History:  75 y.o. WM PMHx Anxiety, Lumbar stenosis S/P laminectomy and Discectomy May 2017 by Dr Ashok Pall., COPD, RA, HTN, OSA, CAD native artery S/P CABG 7 vessel, DM Type 2 uncontrolled with complication, DM neuropathy, PMR, CKD, Cancer of kidney, Hepatitis   Presented with worsening encephalopathy for 24 hours He was discharged to home on June from SNF. On 10th of June he started to get progressively worse. He was seen in office by Dr. Christella Noa and was doing better. But he started to threaten family he had psych eval at [facility] and was told to go to PCP. He went to ER at [facility] care. He was seen in [place] ED. patient had extensive workup chest x-ray was unremarkable UA was concentrated but no evidence of UTI. CT scan of the head was non-acute. U tox positive for benzos and opioids. Troponin was slightly elevated but no EKG changes suggestive of ischemia. MRI was planned. The patient was given an anxiolytic agent. Unfortunately, the patient became lethargic after the anxiolytic agent.MRI of the lumbar spine showed postoperative changes at L3-4 with superimposed cephalad disc extrusion causing severe spinal stenosis as well as abnormal fluid signal and L3-4 disc space. There was concern that this may represent early discitis/osteomyelitis. He was noted to have elevated LFTs ultrasound was done showing no evidence of cholecystitis. Patient was accepted in transfer to stepdown to [facility]   Regarding pertinent Chronic problems: In May patient had a fall in the bathtub MRI of the back show large herniated nucleus pulposus at L3-4 with severe lumbar stenosis. He has undergone lumbar laminectomy discectomy at the level L3/4 by Dr. Barrie Folk op course was complicated by pneumonia, acute renal failure and elevate troponins. He was d/ced to [facility]. Blood pressure  at baseline controlled with amlodipine and Lopressor While in the nursing home he finished Augmentin for his age Pneumonia She has history of polymyalgia rheumatica for which she takes prednisone 7 mg daily Last echogram done in May 2017 showing preserved EF no evidence of diastolic dysfunction  Assessment/Plan: Acute encephalopathy  -multifactorial including infection, polypharmacy, sundowning and day/night reversion in the setting of dehydration  -continues to have waxing and waning -minimize opioids if possible -Normal saline 52m/hrs  -d/c megace -am ammonia -UA and culture -am CMP  Depression with threatening violence against family and self -Most likely secondary to intractable back pain and recent surgery. Per family very depressed about current condition especially with wife also being ill. -seen by psychiatry on 09/01/15--no need for inpt psychiatric admission -Trazodone 100 mg QHS per psychiatry  CAD (coronary artery disease)native artery  -Metoprolol 100 mg BID -Labetalol IV PRN  -Personally reviewed EKG--no concerning ischemic changes -no chest pain presently  Chronic diastolic CHF  - last echogram was done in May 2017 show no evidence of CHF. -08/29/2015 echocardiogram EF 55-60%, grade 1 DD -strict in and out since admission + 1.0 L -Daily weight -08/29/2015 echo EF 55-60%, grade 1 DD  Polymyalgia rheumatica -prednisone 7 mg daily  Early disciitis of lumbar region with radiculopathy status post laminectomy /spinal stenosis -questionable changes on MRI. Infection vs expected post surgical changes? -patient stated had extreme pain at home was asymptomatic -[facility] After reviewing case he also concerned that could be early infection. He Spoke with family offered to perform surgery  to ensure no infection, however patient and family declined. Did agree if necessary would allow needle aspiration of fluid. -6/16 IR  aspirate fluid for culture--neg -Dr. Carlyle Basques ID  recommend starting patient on ceftriaxone and vancomycin once aspirate obtained -ceftriaxone and vanco D#7 -Per ID 6 weeks antibiotics. PICC line placed -Air mattress ensure on rotation at all times -recheck ESR, CRP in am -Dr Ashok Pall.To perform surgery on Friday 6/23  Renal cancer  -S/P Ablation at [facility], stable       CKD stage II-III -Baseline creatinine 1.1-1.4  Elevated troponin  -asymptomatic most likely secondary to demand ischemia.   Diabetes mellitus type 2 controlled with complications -1/75/10- hemoglobin A1c= 6.8 -Decrease Lantus 6 units daily -Resistant SSI -hold off any changes in insulin regimen as pt will be npo after MN  Elevated LFTs - outside hospital. Obtained ultrasound that showed no evidence of gallstone disease or cholecystitis possible dehydration related  Hypokalemia -repleted  Hypomagnesemia -repleted  Pain management  -Methocarbamol 500 mg TID -Morphine PRN          Disposition Plan: Not stable for d/c  Family Communication: discussed with wife on phone--total time 35 min; >50% spent counseling and coordinating care   Consultants: ID, Neurosurgery, Dr. Christella Noa  Code Status: FULL  DVT Prophylaxis: SCDs   Procedures: As Listed in Progress Note Above  Antibiotics: None     Subjective:   Objective: Filed Vitals:   09/05/15 0139 09/05/15 0500 09/05/15 0548 09/05/15 0900  BP: 157/81  164/66 157/73  Pulse: 70  80 79  Temp: 98.7 F (37.1 C)  98.4 F (36.9 C) 98.9 F (37.2 C)  TempSrc: Oral  Oral Oral  Resp: 18  18   Height:      Weight:  96.163 kg (212 lb)    SpO2: 97%  98% 96%    Intake/Output Summary (Last 24 hours) at 09/05/15 1338 Last data filed at 09/05/15 1019  Gross per 24 hour  Intake    240 ml  Output   2175 ml  Net  -1935 ml   Weight change: -2.268 kg (-5 lb) Exam:   General:  Pt is alert, follows commands appropriately,  not in acute distress  HEENT: No icterus, No thrush, No neck mass, [place]/AT  Cardiovascular: RRR, S1/S2, no rubs, no gallops  Respiratory: CTA bilaterally, no wheezing, no crackles, no rhonchi  Abdomen: Soft/+BS, non tender, non distended, no guarding  Extremities: No edema, No lymphangitis, No petechiae, No rashes, no synovitis   Data Reviewed: I have personally reviewed following labs and imaging studies Basic Metabolic Panel:  Recent Labs Lab 08/31/15 0315 09/01/15 0412 09/01/15 2221 09/02/15 0644 09/03/15 0245 09/04/15 0540 09/05/15 0445  NA 141 140  --  138 138  --  137  K 3.5 2.8* 3.2* 2.9* 4.0  --  3.6  CL 109 104  --  103 104  --  104  CO2 27 28  --  28 26  --  27  GLUCOSE 163* 60*  --  90 164*  --  209*  BUN 16 10  --  8 10  --  14  CREATININE 1.29* 1.14  --  1.31* 1.26*  --  1.24  CALCIUM 9.0 9.2  --  9.3 9.3  --  9.0  MG 1.7 1.6* 1.7 1.6* 2.1 2.0 1.8   Liver Function Tests:  Recent Labs Lab 08/30/15 0310 08/31/15 0315 09/01/15 0412 09/02/15 0644  AST _0 38  ALT 53 44  45 51  ALKPHOS 384* 341* 355* 348*  BILITOT 0.8 0.6 0.7 0.7  PROT 5.8* 5.5* 5.8* 6.2*  ALBUMIN 2.4* 2.4* 2.7* 2.8*   No results for input(s): LIPASE, AMYLASE in the last 168 hours. No results for input(s): AMMONIA in the last 168 hours. Coagulation Profile:  Recent Labs Lab 08/30/15 1317  INR 1.02   CBC:  Recent Labs Lab 08/31/15 0315 09/01/15 0412 09/02/15 0644 09/03/15 0245 09/05/15 0445  WBC 8.8 6.6 8.1 9.2 7.7  HGB 10.2* 10.6* 11.4* 11.9* 10.8*  HCT 32.1* 34.0* 36.1* 37.4* 33.7*  MCV 93.6 93.2 93.0 94.0 91.3  PLT 244 250 268 270 273   Cardiac Enzymes:  Recent Labs Lab 08/30/15 0310  TROPONINI 0.03   BNP: Invalid input(s): POCBNP CBG:  Recent Labs Lab 09/04/15 1631 09/04/15 2117 09/05/15 09/05/15 0544 09/05/15 1156  GLUCAP 206* 239* 233* 209* 187*   HbA1C: No results for input(s): HGBA1C in the last 72 hours. Urine analysis:    Component  Value Date/Time   BILIRUBINUR neg 09/09/2013 1233   PROTEINUR >=300 09/09/2013 1233   UROBILINOGEN 0.2 09/09/2013 1233   NITRITE neg 09/09/2013 1233   LEUKOCYTESUR Negative 09/09/2013 1233   Sepsis Labs: _0 (procalcitonin:4,lacticidven:4) ) Recent Results (from the past 240 hour(s))  MRSA PCR Screening     Status: None   Collection Time: 08/29/15  2:20 PM  Result Value Ref Range Status   MRSA by PCR NEGATIVE NEGATIVE Final    Comment:        The GeneXpert MRSA Assay (FDA approved for NASAL specimens only), is one component of a comprehensive MRSA colonization surveillance program. It is not intended to diagnose MRSA infection nor to guide or monitor treatment for MRSA infections.   Culture, blood (routine x 2)     Status: None   Collection Time: 08/30/15  1:25 PM  Result Value Ref Range Status   Specimen Description BLOOD RIGHT ANTECUBITAL  Final   Special Requests IN PEDIATRIC BOTTLE 2CC  Final   Culture NO GROWTH 5 DAYS  Final   Report Status 09/04/2015 FINAL  Final  Culture, blood (routine x 2)     Status: None   Collection Time: 08/30/15  1:30 PM  Result Value Ref Range Status   Specimen Description BLOOD RIGHT ANTECUBITAL  Final   Special Requests IN PEDIATRIC BOTTLE 3CC  Final   Culture NO GROWTH 5 DAYS  Final   Report Status 09/04/2015 FINAL  Final  Body fluid culture     Status: None   Collection Time: 08/30/15  5:17 PM  Result Value Ref Range Status   Specimen Description FLUID  Final   Special Requests INTERVERTEBRAL DISC  Final   Gram Stain   Final    RARE WBC PRESENT,BOTH PMN AND MONONUCLEAR NO ORGANISMS SEEN    Culture No growth aerobically or anaerobically.  Final   Report Status 09/04/2015 FINAL  Final     Scheduled Meds: . allopurinol  300 mg Oral Q lunch  . amLODipine  10 mg Oral Q supper  . antiseptic oral rinse  7 mL Mouth Rinse BID  . aspirin EC  81 mg Oral Q lunch  . atorvastatin  20 mg Oral q1800  . calcitRIOL  0.25 mcg Oral Q  M,W,F  . cefTRIAXone (ROCEPHIN)  IV  2 g Intravenous Q24H  . cycloSPORINE  1 drop Both Eyes BID  . feeding supplement (GLUCERNA SHAKE)  237 mL Oral TID BM  . finasteride  5 mg Oral  QHS  . insulin aspart  0-20 Units Subcutaneous TID WC  . insulin aspart  0-5 Units Subcutaneous QHS  . insulin glargine  6 Units Subcutaneous QHS  . metoprolol  100 mg Oral BID  . mometasone-formoterol  2 puff Inhalation BID  . montelukast  10 mg Oral QHS  . pilocarpine  1 drop Both Eyes TID  . polyethylene glycol  17 g Oral BID  . predniSONE  7 mg Oral Q breakfast  . senna-docusate  2 tablet Oral BID  . sodium chloride flush  10-40 mL Intracatheter Q12H  . tamsulosin  0.4 mg Oral QHS  . traZODone  100 mg Oral QHS  . vancomycin  750 mg Intravenous Q12H   Continuous Infusions: . sodium chloride 50 mL/hr at 09/04/15 2053    Procedures/Studies: Mr Brain Wo Contrast  08/29/2015  CLINICAL DATA:  Altered mental status. Patient threatening himself and others. Lethargy. Evaluation for stroke. EXAM: MRI HEAD WITHOUT CONTRAST TECHNIQUE: Multiplanar, multiecho pulse sequences of the brain and surrounding structures were obtained without intravenous contrast. COMPARISON:  08/03/2015 head CT FINDINGS: The study is mildly motion degraded. There is no evidence of acute infarct, intracranial hemorrhage, mass, midline shift, or extra-axial fluid collection. Cerebral atrophy is within normal limits for age. Patchy cerebral white matter T2 hyperintensities are nonspecific but compatible with mild-to-moderate chronic small vessel ischemic disease. Prior bilateral cataract extraction is noted. Paranasal sinuses and mastoid air cells are clear. Major intracranial vascular flow voids are preserved. IMPRESSION: 1. No acute intracranial abnormality. 2. Mild-to-moderate chronic small vessel ischemic disease. Electronically Signed   By: Logan Bores M.D.   On: 08/29/2015 13:03   Mr Lumbar Spine W Wo Contrast  08/30/2015  ADDENDUM  REPORT: 08/30/2015 09:27 ADDENDUM: Study discussed by telephone with Imelda Pillow, Medical Assitant for Dr. Ashok Pall on 08/30/2015 at 0912 hours. Electronically Signed   By: Genevie Ann M.D.   On: 08/30/2015 09:27  08/30/2015  CLINICAL DATA:  75 year old male with lumbar back pain radiating to the left lower extremity status post lumbar spine surgery 07/30/2015. Subsequent encounter. EXAM: MRI LUMBAR SPINE WITHOUT AND WITH CONTRAST TECHNIQUE: Multiplanar and multiecho pulse sequences of the lumbar spine were obtained without and with intravenous contrast. CONTRAST:  9m MULTIHANCE GADOBENATE DIMEGLUMINE 529 MG/ML IV SOLN COMPARISON:  Outside preoperative lumbar MRI 07/24/2015. Intraoperative radiographs 07/30/2015. FINDINGS: Segmentation:  Normal as seen on the intraoperative radiographs. Alignment: Interval mild L4 superior endplate deformity with mild (5-10%) loss of L4 vertebral body height. Associated mild endplate edema there are and at the adjacent L3 inferior endplate. Postoperative changes at this level further described below. Stable vertebral height and alignment elsewhere. Vertebrae: Endplate marrow edema at L3 and L4 as stated. Stable and normal bone marrow signal elsewhere. Conus medullaris: Extends to the L1-L2 level and appears normal. No lower thoracic spinal cord signal abnormality. Paraspinal and other soft tissues: Postoperative changes in the posterior paraspinal soft tissues from the L2 spinous process level to the L4 spinous process level. Medial bilateral erector spinae muscle edema and enhancement. Enhancement tracks from the skin surface to the epidural space at L3-L4. There is a a small 15 mm fluid collection overlying the L3 spinous process. See also L3-L4 disc space findings below. No definite prevertebral soft tissue inflammation. There is mild right greater than left medial psoas muscle inflammation and enhancement (series 6, image 1). Disc levels: The disc space levels L1-L2 and  above are stable. The L4-L5 and L5-S1 levels are stable. L2-L3:  Stable.  L3-L4: Large cephalad disc extrusion with disc fragment encompassing 7 x 11 x 24 mm (AP by transverse by CC). See series 9, image 12 and series 4, image 8. The L3-L4 disc space now appears to be fluid-filled. The endplates are mildly edematous and enhancing. Moderate facet and ligament flavum hypertrophy Re demonstrated. Postoperative changes possibly to the ligament flavum. Subsequent severe spinal stenosis (series 9, image 14). IMPRESSION: 1. Postoperative changes at the L3-L4 level with superimposed large cephalad disc extrusion (series 4, image 8) causing severe spinal stenosis, as well as abnormal fluid signal in the L3-L4 disc space associated with mild L4 superior endplate fracture with L3 and L4 endplate edema/inflammation. Mild associated psoas muscle inflammation. 2. The appearance is such that discitis osteomyelitis at L3-L4 cannot be excluded. 3. There is also a small postoperative fluid collection overlying the L3 spinous process, favor seroma. Electronically Signed: By: Genevie Ann M.D. On: 08/29/2015 13:39   Ir Fluoro Guide Ndl Plmt / Bx  09/04/2015  INDICATION: Lumbar discitis. EXAM: IR FLUORO GUIDE NEEDLE PLACEMENT /BIOPSY AT L3-L4 MEDICATIONS: Versed 2 mg IV.  Fentanyl 50 mcg IV. ANESTHESIA/SEDATION: Moderate (conscious) sedation was employed during this procedure. A total of Versed 2 mg and Fentanyl 50 mcg was administered intravenously. Moderate Sedation Time: 13 minutes. The patient's level of consciousness and vital signs were monitored continuously by radiology nursing throughout the procedure under my direct supervision. FLUOROSCOPY TIME:  Fluoroscopy Time: 2 minutes 36 seconds (207 mGy). COMPLICATIONS: None immediate. PROCEDURE: Informed written consent was obtained from the patient after a thorough discussion of the procedural risks, benefits and alternatives. All questions were addressed. Maximal Sterile Barrier  Technique was utilized including caps, mask, sterile gowns, sterile gloves, sterile drape, hand hygiene and skin antiseptic. A timeout was performed prior to the initiation of the procedure. The patient was laid prone on the fluoroscopic table. The skin overlying the lumbar region was then prepped and draped in the usual sterile fashion. Skin entry site at the L3-L4 paraspinous region was then infiltrated with 0.25% bupivacaine and carried into the paraspinous muscles. Thereafter, using biplane intermittent fluoroscopy, a 21 gauge Francine biopsy needle was then advanced into the L3-L4 disc space without difficulty. Using a 20 mL syringe, a fleck of bloody aspirate was obtained with two passes of two separate 21 gauge Francine needles. Approximately 1.5 cc of aspirate was obtained in total. Hemostasis was achieved at the skin entry site. The specimens were then sent for microbiology testing as per request. The patient tolerated the procedure well with no acute complications. IMPRESSION: Status post fluoroscopic guided needle placement with aspiration of the L3-L4 disc space retrieving a total of 1.5 cc of thick bloody aspirate. Electronically Signed   By: Luanne Bras M.D.   On: 08/30/2015 15:23    Demarea Lorey, DO  [facility] Pager [phone]  If 7PM-7AM, please contact night-coverage [email or web address] Password [email or web address] 09/05/2015, 1:38 PM   LOS: 8 days

## 2015-09-05 NOTE — Progress Notes (Signed)
Pharmacy Antibiotic Note  Jacob [name] is a 75 y.o. male admitted on 08/28/2015 with discitis.   Continues on Vancomycin with plans for 6 weeks of therapy Vancomycin trough this evening high at 25  Plan: 1. Hold Vancomycin dose tonight, decrease to 500 mg iv Q 12 hours in AM 2. Ceftriaxone 2g IV q 24 hrs. 3. F/u culture results to narrow antibiotics if possible.  Height: 5\' 11"  (180.3 cm) Weight: 212 lb (96.163 kg) IBW/kg (Calculated) : 75.3  Temp (24hrs), Avg:98.5 F (36.9 C), Min:97.8 F (36.6 C), Max:98.9 F (37.2 C)   Recent Labs Lab 08/31/15 0315 09/01/15 0412 09/02/15 0630 09/02/15 0644 09/03/15 0245 09/05/15 0445 09/05/15 1950  WBC 8.8 6.6  --  8.1 9.2 7.7  --   CREATININE 1.29* 1.14  --  1.31* 1.26* 1.24  --   VANCOTROUGH  --   --  22*  --   --   --  25*    Estimated Creatinine Clearance: 60.9 mL/min (by C-G formula based on Cr of 1.24).    Allergies  Allergen Reactions  . Ace Inhibitors Other (See Comments)    Probably nausea and vomiting per patient   . Actonel [Risedronate] Nausea And Vomiting  . Ciprocinonide [Fluocinolone] Other (See Comments)    Probably nausea and vomiting per patient  . Flunisolide Other (See Comments)    Probably nausea and vomiting per patient   . Metformin And Related Other (See Comments)    Probably nausea and vomiting per patient   . Sertraline Other (See Comments)    Probably nausea and vomiting per patient   . Sulindac Other (See Comments)    Probably nausea and vomiting per patient   . Terazosin Other (See Comments)    Probably nausea and vomiting per patient     Antimicrobials this admission: Ceftriaxone 6/16>  Vancomycin 6/16>  Dose adjustments this admission: 6/19 VT = 22 (but doses off schedule a bit, and drawn ~ 10 hrs after dose) > con't 750 mg q 12 hrs 6/22 VT = 25  Microbiology results: 6/16 BCx x 2 > NGTD 6/16 Intervertebral disc fluid > NGTD  Thank you Anette Guarneri, PharmD [phone]   09/05/2015 8:41 PM

## 2015-09-05 NOTE — Progress Notes (Signed)
Patient ID: Jacob [name], male   DOB: 09/27/1940, 75 y.o.   MRN: [name] BP 141/61 mmHg  Pulse 85  Temp(Src) 98.1 F (36.7 C) (Oral)  Resp 18  Ht 5\' 11"  (1.803 m)  Wt 96.163 kg (212 lb)  BMI 29.58 kg/m2  SpO2 97% Or tomorrow for redo discetomy L3/4

## 2015-09-05 NOTE — Progress Notes (Signed)
   09/05/15 1611  Clinical Encounter Type  Visited With Patient  Visit Type Initial  Referral From Nurse  Spiritual Encounters  Spiritual Needs Emotional;Prayer  Stress Factors  Patient Stress Factors Health changes  Chaplain responded to consult for prayer.  Chaplain found patient to be disoriented and very confused.  Patient stated he had not been fed and his family had not be called.  Patient stated someone dropped him off and his family was looking for him.  Patient shared he had been married 33 years and has never been apart from his wife.  Chaplain offered to pray with patient.  Patient seemed calmer after prayer.

## 2015-09-05 NOTE — Progress Notes (Signed)
Inpatient Diabetes Program Recommendations  AACE/ADA: New Consensus Statement on Inpatient Glycemic Control (2015)  Target Ranges:  Prepandial:   less than 140 mg/dL      Peak postprandial:   less than 180 mg/dL (1-2 hours)      Critically ill patients:  140 - 180 mg/dL   Lab Results  Component Value Date   GLUCAP 187* 09/05/2015   HGBA1C 6.8* 08/29/2015    Review of Glycemic Control:  Results for TAVARI, COVALT (MRN [name]) as of 09/05/2015 13:45  Ref. Range 09/04/2015 07:23 09/04/2015 11:44 09/04/2015 16:31 09/04/2015 21:17 09/05/2015 00:00 09/05/2015 05:44 09/05/2015 11:56  Glucose-Capillary Latest Ref Range: 65-99 mg/dL 126 (H) 219 (H) 206 (H) 239 (H) 233 (H) 209 (H) 187 (H)   Diabetes history: Type 2 diabetes Outpatient Diabetes medications: Novolog 70/30 40 units bid Current orders for Inpatient glycemic control:  Lantus 6 units q HS, Novolog resistant tid with meals and HS  Inpatient Diabetes Program Recommendations:    Consider reducing Novolog correction to moderate.  Also consider increasing Lantus to 12 units daily.  If patient to be NPO, may need q 4 hour sensitive correction instead.    Thanks, Adah Perl, RN, BC-ADM Inpatient Diabetes Coordinator Pager [phone] (8a-5p)

## 2015-09-05 NOTE — Progress Notes (Signed)
Nutrition Follow-up  DOCUMENTATION CODES:   Not applicable  INTERVENTION:  -Continue Glucerna Shake po TID, each supplement provides 220 kcal and 10 grams of protein -RD to continue to monitor  NUTRITION DIAGNOSIS:   Predicted suboptimal nutrient intake related to poor appetite, lethargy/confusion as evidenced by percent weight loss. -ongoing  GOAL:   Patient will meet greater than or equal to 90% of their needs -not meeting  MONITOR:   PO intake, Supplement acceptance, Labs, Weight trends, Skin, I & O's  REASON FOR ASSESSMENT:   Consult, Malnutrition Screening Tool Assessment of nutrition requirement/status  ASSESSMENT:   75 y.o. male with medical history significant of lumbar stenosis Status post laminectomy, COPD, rheumatoid arthritis, hypertension, sleep apnea, CAD, DM 2, neuropathy, PMR, Here with acute encephalopathy in a setting of dehydration   Mr. Bartolomucci was pretty sleepy during my visit. Spoke with his NT/Sitter. Pt continues to have poor PO intake -> did not eat any breakfast. Slept most of the morning. Documented PO intake 26% over past 6 meals.  He is receiving extra sauces/gravy with meats to help him tolerate but hasn't seemed to help his PO intake at this point. Is receiving glucerna shakes but I am concerned with his actual consumption of them. Had one sitting un-opened on the table during my visit. Weight is fluctuating but stable.  Labs and Medications reviewed: CBGs 187-233;  Senokot-S  Diet Order:  Diet Carb Modified Fluid consistency:: Thin; Room service appropriate?: Yes  Skin:  Reviewed, no issues  Last BM:  PTA  Height:   Ht Readings from Last 1 Encounters:  09/04/15 5\' 11"  (1.803 m)    Weight:   Wt Readings from Last 1 Encounters:  09/05/15 212 lb (96.163 kg)    Ideal Body Weight:  78.2 kg  BMI:  Body mass index is 29.58 kg/(m^2).  Estimated Nutritional Needs:   Kcal:  2100-2300  Protein:  115-130 grams  Fluid:   2.1-2.3 L  EDUCATION NEEDS:   No education needs identified at this time  Satira Anis. Cain Fitzhenry, MS, RD LDN Inpatient Clinical Dietitian Pager [phone]

## 2015-09-06 ENCOUNTER — Encounter (HOSPITAL_COMMUNITY)
Admission: AD | Disposition: A | Discharge status: Skilled Nursing Facility | Payer: Self-pay | Source: Other Acute Inpatient Hospital | Attending: Internal Medicine

## 2015-09-06 ENCOUNTER — Inpatient Hospital Stay (HOSPITAL_COMMUNITY): Payer: PPO | Admitting: Certified Registered Nurse Anesthetist

## 2015-09-06 ENCOUNTER — Encounter (HOSPITAL_COMMUNITY): Payer: Self-pay | Admitting: Anesthesiology

## 2015-09-06 ENCOUNTER — Inpatient Hospital Stay (HOSPITAL_COMMUNITY): Payer: PPO

## 2015-09-06 DIAGNOSIS — E118 Type 2 diabetes mellitus with unspecified complications: Secondary | ICD-10-CM | POA: Diagnosis not present

## 2015-09-06 DIAGNOSIS — Z981 Arthrodesis status: Secondary | ICD-10-CM | POA: Diagnosis not present

## 2015-09-06 DIAGNOSIS — F4323 Adjustment disorder with mixed anxiety and depressed mood: Secondary | ICD-10-CM | POA: Diagnosis not present

## 2015-09-06 DIAGNOSIS — M4626 Osteomyelitis of vertebra, lumbar region: Secondary | ICD-10-CM | POA: Diagnosis not present

## 2015-09-06 DIAGNOSIS — I5032 Chronic diastolic (congestive) heart failure: Secondary | ICD-10-CM | POA: Diagnosis not present

## 2015-09-06 DIAGNOSIS — E860 Dehydration: Secondary | ICD-10-CM | POA: Diagnosis not present

## 2015-09-06 DIAGNOSIS — N184 Chronic kidney disease, stage 4 (severe): Secondary | ICD-10-CM | POA: Diagnosis not present

## 2015-09-06 DIAGNOSIS — C649 Malignant neoplasm of unspecified kidney, except renal pelvis: Secondary | ICD-10-CM | POA: Diagnosis not present

## 2015-09-06 DIAGNOSIS — M4647 Discitis, unspecified, lumbosacral region: Secondary | ICD-10-CM | POA: Diagnosis not present

## 2015-09-06 DIAGNOSIS — I2510 Atherosclerotic heart disease of native coronary artery without angina pectoris: Secondary | ICD-10-CM | POA: Diagnosis not present

## 2015-09-06 DIAGNOSIS — I130 Hypertensive heart and chronic kidney disease with heart failure and stage 1 through stage 4 chronic kidney disease, or unspecified chronic kidney disease: Secondary | ICD-10-CM | POA: Diagnosis not present

## 2015-09-06 DIAGNOSIS — G9340 Encephalopathy, unspecified: Secondary | ICD-10-CM | POA: Diagnosis not present

## 2015-09-06 DIAGNOSIS — M4806 Spinal stenosis, lumbar region: Secondary | ICD-10-CM | POA: Diagnosis not present

## 2015-09-06 DIAGNOSIS — D649 Anemia, unspecified: Secondary | ICD-10-CM | POA: Diagnosis not present

## 2015-09-06 DIAGNOSIS — M4646 Discitis, unspecified, lumbar region: Secondary | ICD-10-CM | POA: Diagnosis not present

## 2015-09-06 DIAGNOSIS — E1122 Type 2 diabetes mellitus with diabetic chronic kidney disease: Secondary | ICD-10-CM | POA: Diagnosis not present

## 2015-09-06 DIAGNOSIS — E1165 Type 2 diabetes mellitus with hyperglycemia: Secondary | ICD-10-CM | POA: Diagnosis not present

## 2015-09-06 DIAGNOSIS — T814XXA Infection following a procedure, initial encounter: Secondary | ICD-10-CM | POA: Diagnosis not present

## 2015-09-06 DIAGNOSIS — R7989 Other specified abnormal findings of blood chemistry: Secondary | ICD-10-CM | POA: Diagnosis not present

## 2015-09-06 DIAGNOSIS — M069 Rheumatoid arthritis, unspecified: Secondary | ICD-10-CM | POA: Diagnosis not present

## 2015-09-06 DIAGNOSIS — M5126 Other intervertebral disc displacement, lumbar region: Secondary | ICD-10-CM | POA: Diagnosis not present

## 2015-09-06 DIAGNOSIS — J449 Chronic obstructive pulmonary disease, unspecified: Secondary | ICD-10-CM | POA: Diagnosis not present

## 2015-09-06 HISTORY — PX: LUMBAR LAMINECTOMY/DECOMPRESSION MICRODISCECTOMY: SHX5026

## 2015-09-06 LAB — COMPREHENSIVE METABOLIC PANEL
ALT: 58 U/L (ref 17–63)
AST: 35 U/L (ref 15–41)
Albumin: 2.7 g/dL — ABNORMAL LOW (ref 3.5–5.0)
Alkaline Phosphatase: 245 U/L — ABNORMAL HIGH (ref 38–126)
Anion gap: 8 (ref 5–15)
BUN: 12 mg/dL (ref 6–20)
CO2: 26 mmol/L (ref 22–32)
Calcium: 9.1 mg/dL (ref 8.9–10.3)
Chloride: 105 mmol/L (ref 101–111)
Creatinine, Ser: 1.32 mg/dL — ABNORMAL HIGH (ref 0.61–1.24)
GFR calc Af Amer: 59 mL/min — ABNORMAL LOW (ref >60)
GFR calc non Af Amer: 51 mL/min — ABNORMAL LOW (ref >60)
Glucose, Bld: 168 mg/dL — ABNORMAL HIGH (ref 65–99)
Potassium: 3.4 mmol/L — ABNORMAL LOW (ref 3.5–5.1)
Sodium: 139 mmol/L (ref 135–145)
Total Bilirubin: 0.9 mg/dL (ref 0.3–1.2)
Total Protein: 5.7 g/dL — ABNORMAL LOW (ref 6.5–8.1)

## 2015-09-06 LAB — GLUCOSE, CAPILLARY
Glucose-Capillary: 142 mg/dL — ABNORMAL HIGH (ref 65–99)
Glucose-Capillary: 145 mg/dL — ABNORMAL HIGH (ref 65–99)
Glucose-Capillary: 165 mg/dL — ABNORMAL HIGH (ref 65–99)
Glucose-Capillary: 117 mg/dL — ABNORMAL HIGH (ref 65–99)
Glucose-Capillary: 143 mg/dL — ABNORMAL HIGH (ref 65–99)

## 2015-09-06 LAB — AMMONIA: Ammonia: 13 umol/L (ref 9–35)

## 2015-09-06 LAB — C-REACTIVE PROTEIN: CRP: 0.7 mg/dL (ref <1.0)

## 2015-09-06 LAB — MAGNESIUM: Magnesium: 1.7 mg/dL (ref 1.7–2.4)

## 2015-09-06 LAB — SEDIMENTATION RATE: Sed Rate: 40 mm/hr — ABNORMAL HIGH (ref 0–16)

## 2015-09-06 SURGERY — LUMBAR LAMINECTOMY/DECOMPRESSION MICRODISCECTOMY 1 LEVEL
Anesthesia: General | Site: Back

## 2015-09-06 MED ORDER — LABETALOL HCL 5 MG/ML IV SOLN
INTRAVENOUS | Status: AC
  Administered 2015-09-06: 5 mg via INTRAVENOUS
  Filled 2015-09-06: qty 4

## 2015-09-06 MED ORDER — ROCURONIUM BROMIDE 50 MG/5ML IV SOLN
INTRAVENOUS | Status: AC
  Filled 2015-09-06: qty 1

## 2015-09-06 MED ORDER — LABETALOL HCL 5 MG/ML IV SOLN
INTRAVENOUS | Status: AC
  Filled 2015-09-06: qty 4

## 2015-09-06 MED ORDER — CEFAZOLIN SODIUM-DEXTROSE 2-4 GM/100ML-% IV SOLN
INTRAVENOUS | Status: AC
  Filled 2015-09-06: qty 100

## 2015-09-06 MED ORDER — ARTIFICIAL TEARS OP OINT
TOPICAL_OINTMENT | OPHTHALMIC | Status: AC
  Filled 2015-09-06: qty 3.5

## 2015-09-06 MED ORDER — FENTANYL CITRATE (PF) 100 MCG/2ML IJ SOLN
INTRAMUSCULAR | Status: DC | PRN
  Administered 2015-09-06: 50 ug via INTRAVENOUS
  Administered 2015-09-06: 150 ug via INTRAVENOUS
  Administered 2015-09-06: 50 ug via INTRAVENOUS

## 2015-09-06 MED ORDER — SUGAMMADEX SODIUM 200 MG/2ML IV SOLN
INTRAVENOUS | Status: AC
  Filled 2015-09-06: qty 2

## 2015-09-06 MED ORDER — POTASSIUM CHLORIDE 10 MEQ/100ML IV SOLN: 10.0000 meq | Freq: Once | INTRAVENOUS | Status: DC

## 2015-09-06 MED ORDER — ONDANSETRON HCL 4 MG/2ML IJ SOLN
INTRAMUSCULAR | Status: AC
  Filled 2015-09-06: qty 2

## 2015-09-06 MED ORDER — EPHEDRINE 5 MG/ML INJ
INTRAVENOUS | Status: AC
  Filled 2015-09-06: qty 10

## 2015-09-06 MED ORDER — ROCURONIUM BROMIDE 50 MG/5ML IV SOLN
INTRAVENOUS | Status: AC
  Filled 2015-09-06: qty 2

## 2015-09-06 MED ORDER — FENTANYL CITRATE (PF) 100 MCG/2ML IJ SOLN
25.0000 ug | INTRAMUSCULAR | Status: DC | PRN
  Administered 2015-09-06: 25 ug via INTRAVENOUS
  Administered 2015-09-06: 50 ug via INTRAVENOUS
  Administered 2015-09-06: 25 ug via INTRAVENOUS

## 2015-09-06 MED ORDER — LIDOCAINE 2% (20 MG/ML) 5 ML SYRINGE
INTRAMUSCULAR | Status: AC
  Filled 2015-09-06: qty 10

## 2015-09-06 MED ORDER — SUGAMMADEX SODIUM 200 MG/2ML IV SOLN
INTRAVENOUS | Status: DC | PRN
  Administered 2015-09-06: 200 mg via INTRAVENOUS

## 2015-09-06 MED ORDER — PHENYLEPHRINE HCL 10 MG/ML IJ SOLN
INTRAMUSCULAR | Status: DC | PRN
  Administered 2015-09-06: 80 ug via INTRAVENOUS

## 2015-09-06 MED ORDER — FENTANYL CITRATE (PF) 100 MCG/2ML IJ SOLN
INTRAMUSCULAR | Status: AC
  Filled 2015-09-06: qty 2

## 2015-09-06 MED ORDER — MIDAZOLAM HCL 5 MG/5ML IJ SOLN
INTRAMUSCULAR | Status: DC | PRN
Start: 2015-09-06 — End: 2015-09-06
  Administered 2015-09-06: 1 mg via INTRAVENOUS

## 2015-09-06 MED ORDER — 0.9 % SODIUM CHLORIDE (POUR BTL) OPTIME
TOPICAL | Status: DC | PRN
  Administered 2015-09-06: 1000 mL

## 2015-09-06 MED ORDER — HEMOSTATIC AGENTS (NO CHARGE) OPTIME
TOPICAL | Status: DC | PRN
  Administered 2015-09-06: 1 via TOPICAL

## 2015-09-06 MED ORDER — ONDANSETRON HCL 4 MG/2ML IJ SOLN
INTRAMUSCULAR | Status: DC | PRN
  Administered 2015-09-06: 4 mg via INTRAVENOUS

## 2015-09-06 MED ORDER — LABETALOL HCL 5 MG/ML IV SOLN
5.0000 mg | INTRAVENOUS | Status: AC | PRN
  Administered 2015-09-06 (×4): 5 mg via INTRAVENOUS

## 2015-09-06 MED ORDER — PROPOFOL 10 MG/ML IV BOLUS
INTRAVENOUS | Status: AC
  Filled 2015-09-06: qty 20

## 2015-09-06 MED ORDER — LACTATED RINGERS IV SOLN
INTRAVENOUS | Status: DC | PRN
  Administered 2015-09-06: 19:00:00 via INTRAVENOUS

## 2015-09-06 MED ORDER — MIDAZOLAM HCL 2 MG/2ML IJ SOLN
INTRAMUSCULAR | Status: AC
  Filled 2015-09-06: qty 2

## 2015-09-06 MED ORDER — SODIUM CHLORIDE 0.9 % IV SOLN: INTRAVENOUS | Status: DC | PRN

## 2015-09-06 MED ORDER — FENTANYL CITRATE (PF) 250 MCG/5ML IJ SOLN
INTRAMUSCULAR | Status: AC
  Filled 2015-09-06: qty 5

## 2015-09-06 MED ORDER — LIDOCAINE-EPINEPHRINE 0.5 %-1:200000 IJ SOLN
INTRAMUSCULAR | Status: DC | PRN
  Administered 2015-09-06: 10 mL

## 2015-09-06 MED ORDER — FENTANYL CITRATE (PF) 100 MCG/2ML IJ SOLN
INTRAMUSCULAR | Status: AC
  Administered 2015-09-06: 25 ug via INTRAVENOUS
  Filled 2015-09-06: qty 2

## 2015-09-06 MED ORDER — SODIUM CHLORIDE 0.9 % IV SOLN
10.0000 mg | INTRAVENOUS | Status: DC | PRN
  Administered 2015-09-06: 25 ug/min via INTRAVENOUS

## 2015-09-06 MED ORDER — PHENYLEPHRINE HCL 10 MG/ML IJ SOLN
INTRAMUSCULAR | Status: AC
  Filled 2015-09-06: qty 1

## 2015-09-06 MED ORDER — PROPOFOL 10 MG/ML IV BOLUS
INTRAVENOUS | Status: DC | PRN
Start: 2015-09-06 — End: 2015-09-06
  Administered 2015-09-06 (×2): 100 mg via INTRAVENOUS

## 2015-09-06 MED ORDER — THROMBIN 5000 UNITS EX SOLR
CUTANEOUS | Status: DC | PRN
  Administered 2015-09-06: 10000 [IU] via TOPICAL

## 2015-09-06 MED ORDER — LABETALOL HCL 5 MG/ML IV SOLN
10.0000 mg | INTRAVENOUS | Status: AC | PRN
  Administered 2015-09-06 (×2): 10 mg via INTRAVENOUS

## 2015-09-06 MED ORDER — ROCURONIUM BROMIDE 100 MG/10ML IV SOLN
INTRAVENOUS | Status: DC | PRN
Start: 2015-09-06 — End: 2015-09-06
  Administered 2015-09-06: 50 mg via INTRAVENOUS

## 2015-09-06 MED ORDER — ARTIFICIAL TEARS OP OINT
TOPICAL_OINTMENT | OPHTHALMIC | Status: DC | PRN
  Administered 2015-09-06: 1 via OPHTHALMIC

## 2015-09-06 MED ORDER — SUCCINYLCHOLINE CHLORIDE 200 MG/10ML IV SOSY
PREFILLED_SYRINGE | INTRAVENOUS | Status: AC
  Filled 2015-09-06: qty 10

## 2015-09-06 SURGICAL SUPPLY — 51 items
APL SKNCLS STERI-STRIP NONHPOA (GAUZE/BANDAGES/DRESSINGS)
BAG DECANTER FOR FLEXI CONT (MISCELLANEOUS) ×2 IMPLANT
BENZOIN TINCTURE PRP APPL 2/3 (GAUZE/BANDAGES/DRESSINGS) IMPLANT
BLADE CLIPPER SURG (BLADE) IMPLANT
BUR MATCHSTICK NEURO 3.0 LAGG (BURR) ×2 IMPLANT
CANISTER SUCT 3000ML PPV (MISCELLANEOUS) ×2 IMPLANT
CONT SPEC 4OZ CLIKSEAL STRL BL (MISCELLANEOUS) ×1 IMPLANT
DECANTER SPIKE VIAL GLASS SM (MISCELLANEOUS) ×2 IMPLANT
DRAPE LAPAROTOMY 100X72X124 (DRAPES) ×2 IMPLANT
DRAPE MICROSCOPE LEICA (MISCELLANEOUS) ×2 IMPLANT
DRAPE POUCH INSTRU U-SHP 10X18 (DRAPES) ×2 IMPLANT
DRAPE SURG 17X23 STRL (DRAPES) ×2 IMPLANT
DRSG OPSITE POSTOP 4X6 (GAUZE/BANDAGES/DRESSINGS) ×1 IMPLANT
DURAPREP 26ML APPLICATOR (WOUND CARE) ×2 IMPLANT
ELECT REM PT RETURN 9FT ADLT (ELECTROSURGICAL) ×2
ELECTRODE REM PT RTRN 9FT ADLT (ELECTROSURGICAL) ×1 IMPLANT
GAUZE SPONGE 4X4 12PLY STRL (GAUZE/BANDAGES/DRESSINGS) IMPLANT
GAUZE SPONGE 4X4 16PLY XRAY LF (GAUZE/BANDAGES/DRESSINGS) IMPLANT
GLOVE BIO SURGEON STRL SZ 6.5 (GLOVE) ×1 IMPLANT
GLOVE BIO SURGEON STRL SZ7 (GLOVE) ×1 IMPLANT
GLOVE ECLIPSE 6.5 STRL STRAW (GLOVE) ×2 IMPLANT
GLOVE EXAM NITRILE LRG STRL (GLOVE) IMPLANT
GLOVE EXAM NITRILE MD LF STRL (GLOVE) IMPLANT
GLOVE EXAM NITRILE XL STR (GLOVE) IMPLANT
GLOVE EXAM NITRILE XS STR PU (GLOVE) IMPLANT
GOWN STRL REUS W/ TWL LRG LVL3 (GOWN DISPOSABLE) ×2 IMPLANT
GOWN STRL REUS W/ TWL XL LVL3 (GOWN DISPOSABLE) IMPLANT
GOWN STRL REUS W/TWL 2XL LVL3 (GOWN DISPOSABLE) IMPLANT
GOWN STRL REUS W/TWL LRG LVL3 (GOWN DISPOSABLE) ×6
GOWN STRL REUS W/TWL XL LVL3 (GOWN DISPOSABLE)
KIT BASIN OR (CUSTOM PROCEDURE TRAY) ×2 IMPLANT
KIT ROOM TURNOVER OR (KITS) ×2 IMPLANT
LIQUID BAND (GAUZE/BANDAGES/DRESSINGS) ×2 IMPLANT
NDL HYPO 25X1 1.5 SAFETY (NEEDLE) ×1 IMPLANT
NDL SPNL 18GX3.5 QUINCKE PK (NEEDLE) IMPLANT
NEEDLE HYPO 25X1 1.5 SAFETY (NEEDLE) ×2 IMPLANT
NEEDLE SPNL 18GX3.5 QUINCKE PK (NEEDLE) IMPLANT
NS IRRIG 1000ML POUR BTL (IV SOLUTION) ×2 IMPLANT
PACK LAMINECTOMY NEURO (CUSTOM PROCEDURE TRAY) ×2 IMPLANT
PAD ARMBOARD 7.5X6 YLW CONV (MISCELLANEOUS) ×6 IMPLANT
RUBBERBAND STERILE (MISCELLANEOUS) ×4 IMPLANT
SPONGE LAP 4X18 X RAY DECT (DISPOSABLE) IMPLANT
SPONGE SURGIFOAM ABS GEL SZ50 (HEMOSTASIS) ×2 IMPLANT
STRIP CLOSURE SKIN 1/2X4 (GAUZE/BANDAGES/DRESSINGS) IMPLANT
SUT VIC AB 0 CT1 18XCR BRD8 (SUTURE) ×1 IMPLANT
SUT VIC AB 0 CT1 8-18 (SUTURE) ×2
SUT VIC AB 2-0 CT1 18 (SUTURE) ×2 IMPLANT
SUT VIC AB 3-0 SH 8-18 (SUTURE) ×2 IMPLANT
TOWEL OR 17X24 6PK STRL BLUE (TOWEL DISPOSABLE) ×2 IMPLANT
TOWEL OR 17X26 10 PK STRL BLUE (TOWEL DISPOSABLE) ×2 IMPLANT
WATER STERILE IRR 1000ML POUR (IV SOLUTION) ×2 IMPLANT

## 2015-09-06 NOTE — Clinical Social Work Placement (Signed)
   CLINICAL SOCIAL WORK PLACEMENT  NOTE  Date:  09/06/2015  Patient Details  Name: Jacob [name] MRN: [id number] Date of Birth: 03/28/1940  Clinical Social Work is seeking post-discharge placement for this patient at the [place] level of care (*CSW will initial, date and re-position this form in  chart as items are completed):  Yes   Patient/family provided with [place] Work Department's list of facilities offering this level of care within the geographic area requested by the patient (or if unable, by the patient's family).  Yes   Patient/family informed of their freedom to choose among providers that offer the needed level of care, that participate in Medicare, Medicaid or managed care program needed by the patient, have an available bed and are willing to accept the patient.  Yes   Patient/family informed of [facility]'s ownership interest in [facility] and [facility], as well as of the fact that they are under no obligation to receive care at these facilities.  PASRR submitted to EDS on 09/03/15     PASRR number received on       Existing PASRR number confirmed on 09/03/15     FL2 transmitted to all facilities in geographic area requested by pt/family on 09/06/15     FL2 transmitted to all facilities within larger geographic area on       Patient informed that his/her managed care company has contracts with or will negotiate with certain facilities, including the following:            Patient/family informed of bed offers received.  Patient chooses bed at       Physician recommends and patient chooses bed at      Patient to be transferred to   on  .  Patient to be transferred to facility by       Patient family notified on   of transfer.  Name of family member notified:        PHYSICIAN Please sign FL2     Additional Comment:    _______________________________________________ Ross Ludwig, LCSWA 09/06/2015,  6:20 PM

## 2015-09-06 NOTE — Progress Notes (Signed)
PROGRESS NOTE  Jacob [name] [id number] DOB: April 02, 1940 DOA: 08/28/2015 PCP: [name], MD Brief History:  75 y.o. WM PMHx Anxiety, Lumbar stenosis S/P laminectomy and Discectomy May 2017 by Dr Ashok Pall., COPD, RA, HTN, OSA, CAD native artery S/P CABG 7 vessel, DM Type 2 uncontrolled with complication, DM neuropathy, PMR, CKD, Cancer of kidney, Hepatitis  Presented with worsening encephalopathy for 24 hours He was discharged to home on June 8 from SNF. On 10th of June he started to get progressively worse. He was seen in office by Dr. Christella Noa and was doing better. But he started to threaten family he had psych eval at [facility] and was told to go to PCP. He went to ER at [facility] care. He was seen in [place] ED. patient had extensive workup chest x-ray was unremarkable UA was concentrated but no evidence of UTI. CT scan of the head was non-acute. U tox positive for benzos and opioids. Troponin was slightly elevated but no EKG changes suggestive of ischemia. MRI was planned. The patient was given an anxiolytic agent. Unfortunately, the patient became lethargic after the anxiolytic agent.MRI of the lumbar spine showed postoperative changes at L3-4 with superimposed cephalad disc extrusion causing severe spinal stenosis as well as abnormal fluid signal and L3-4 disc space. There was concern that this may represent early discitis/osteomyelitis. He was noted to have elevated LFTs ultrasound was done showing no evidence of cholecystitis. Patient was accepted in transfer to stepdown to [facility]   Regarding pertinent Chronic problems: In May patient had a fall in the bathtub MRI of the back show large herniated nucleus pulposus at L3-4 with severe lumbar stenosis. He has undergone lumbar laminectomy discectomy at the level L3/4 by Dr. Barrie Folk op course was complicated by pneumonia, acute renal failure and elevate troponins. He was d/ced to [facility] on 08/09/15. Blood pressure at  baseline controlled with amlodipine and Lopressor While in the nursing home he finished Augmentin for his age Pneumonia She has history of polymyalgia rheumatica for which she takes prednisone 7 mg daily Last echogram done in May 2017 showing preserved EF no evidence of diastolic dysfunction Brief History:  Acute encephalopathy  -multifactorial including infection, polypharmacy, sundowning and day/night reversion in the setting of dehydration  -continues to have waxing and waning -minimize opioids if possible -Normal saline 80m/hrs  -d/c megace -TSH 1.709; B12--941 -am ammonia--13 -UA--no pyuria -6/15 MR brain--neg for acute findings  Depression with threatening violence against family and self -Most likely secondary to intractable back pain and recent surgery. Per family very depressed about current condition especially with wife also being ill. -seen by psychiatry on 09/01/15--no need for inpt psychiatric admission -Trazodone 100 mg QHS per psychiatry  CAD (coronary artery disease)native artery  -Metoprolol 100 mg BID -Labetalol IV PRN  -Personally reviewed EKG--no concerning ischemic changes -no chest pain presently  Chronic diastolic CHF  - last echogram was done in May 2017 show no evidence of CHF. -08/29/2015 echocardiogram EF 55-60%, grade 1 DD -strict in and out since admission--neg 2.7L for the admission -Daily weight -08/29/2015 echo EF 55-60%, grade 1 DD  Polymyalgia rheumatica -prednisone 7 mg daily  Early disciitis of lumbar region with radiculopathy status post laminectomy /spinal stenosis -questionable changes on MRI. Infection vs expected post surgical changes? -patient stated had extreme pain at home was asymptomatic -[facility] After reviewing case he also concerned that could be early infection. He  Spoke with family offered to perform surgery to ensure no infection, however patient and family declined. Did  agree if necessary would allow needle aspiration of fluid. -6/16 IR aspirate fluid for culture--neg -Dr. Carlyle Basques ID recommend starting patient on ceftriaxone and vancomycin once aspirate obtained -ceftriaxone and vanco D#8 of 42 -Per ID 6 weeks antibiotics. PICC line placed -Air mattress ensure on rotation at all times -recheck ESR 90-->40 -recheck CRP 17.1-->0.7 -Dr Ashok Pall.To perform surgery on Friday 6/23  Renal cancer  -S/P Ablation at [facility], stable       CKD stage II-III -Baseline creatinine 1.1-1.4  Elevated troponin  -asymptomatic most likely secondary to demand ischemia.   Diabetes mellitus type 2 controlled with complications -3/97/67- hemoglobin A1c= 6.8 -Decrease Lantus 6 units daily -Resistant SSI -hold off any changes in insulin regimen as pt will be npo for surgery  Elevated LFTs - outside hospital. Obtained ultrasound that showed no evidence of gallstone disease or cholecystitis possible dehydration related  Hypokalemia -repleted  Hypomagnesemia -repleted  Pain management  -Methocarbamol 500 mg TID -Morphine PRN                Disposition Plan:   Not stable for d/c Family Communication:   Daughter update on phone    Consultants:  Neurosurgery ([place]), ID [facility])  Code Status:  FULL / DNR  DVT Prophylaxis:  [place] Heparin / [place] Lovenox   Procedures: As Listed in Progress Note Above  Antibiotics: None    Subjective:   Objective: Filed Vitals:   09/06/15 0115 09/06/15 0546 09/06/15 0927 09/06/15 1420  BP: 142/68 136/66 169/76 158/72  Pulse: 76 72 77 85  Temp: 98.1 F (36.7 C) 98.3 F (36.8 C) 98.4 F (36.9 C) 98.6 F (37 C)  TempSrc: Oral Oral Axillary Oral  Resp: 18 20 18 19   Height:      Weight:      SpO2: 97% 98% 98% 98%    Intake/Output Summary (Last 24 hours) at 09/06/15 1627 Last data filed at 09/06/15 1420  Gross per 24 hour  Intake      0 ml  Output   1425 ml  Net  -1425 ml   Weight  change:  Exam:   General:  Pt is alert, follows commands appropriately, not in acute distress  HEENT: No icterus, No thrush, No neck mass, [place]/AT  Cardiovascular: RRR, S1/S2, no rubs, no gallops  Respiratory: CTA bilaterally, no wheezing, no crackles, no rhonchi  Abdomen: Soft/+BS, non tender, non distended, no guarding  Extremities: No edema, No lymphangitis, No petechiae, No rashes, no synovitis   Data Reviewed: I have personally reviewed following labs and imaging studies Basic Metabolic Panel:  Recent Labs Lab 09/01/15 0412 09/01/15 2221 09/02/15 0644 09/03/15 0245 09/04/15 0540 09/05/15 0445 09/06/15 0559  NA 140  --  138 138  --  137 139  K 2.8* 3.2* 2.9* 4.0  --  3.6 3.4*  CL 104  --  103 104  --  104 105  CO2 28  --  28 26  --  27 26  GLUCOSE 60*  --  90 164*  --  209* 168*  BUN 10  --  8 10  --  14 12  CREATININE 1.14  --  1.31* 1.26*  --  1.24 1.32*  CALCIUM 9.2  --  9.3 9.3  --  9.0 9.1  MG 1.6* 1.7 1.6* 2.1 2.0 1.8 1.7   Liver Function Tests:  Recent Labs Lab 08/31/15 0315  09/01/15 0412 09/02/15 0644 09/06/15 0559  AST 27 29 38 35  ALT 44 45 51 58  ALKPHOS 341* 355* 348* 245*  BILITOT 0.6 0.7 0.7 0.9  PROT 5.5* 5.8* 6.2* 5.7*  ALBUMIN 2.4* 2.7* 2.8* 2.7*   No results for input(s): LIPASE, AMYLASE in the last 168 hours.  Recent Labs Lab 09/06/15 0559  AMMONIA 13   Coagulation Profile: No results for input(s): INR, PROTIME in the last 168 hours. CBC:  Recent Labs Lab 08/31/15 0315 09/01/15 0412 09/02/15 0644 09/03/15 0245 09/05/15 0445  WBC 8.8 6.6 8.1 9.2 7.7  HGB 10.2* 10.6* 11.4* 11.9* 10.8*  HCT 32.1* 34.0* 36.1* 37.4* 33.7*  MCV 93.6 93.2 93.0 94.0 91.3  PLT 244 250 268 270 273   Cardiac Enzymes: No results for input(s): CKTOTAL, CKMB, CKMBINDEX, TROPONINI in the last 168 hours. BNP: Invalid input(s): POCBNP CBG:  Recent Labs Lab 09/05/15 1156 09/05/15 1654 09/05/15 2145 09/06/15 0640 09/06/15 1115  GLUCAP 187*  172* 172* 142* 145*   HbA1C: No results for input(s): HGBA1C in the last 72 hours. Urine analysis:    Component Value Date/Time   COLORURINE YELLOW 09/05/2015 2048   APPEARANCEUR CLOUDY* 09/05/2015 2048   LABSPEC 1.015 09/05/2015 2048   PHURINE 7.0 09/05/2015 2048   GLUCOSEU NEGATIVE 09/05/2015 2048   HGBUR NEGATIVE 09/05/2015 2048   BILIRUBINUR NEGATIVE 09/05/2015 2048   BILIRUBINUR neg 09/09/2013 1233   KETONESUR NEGATIVE 09/05/2015 2048   PROTEINUR >300* 09/05/2015 2048   PROTEINUR >=300 09/09/2013 1233   UROBILINOGEN 0.2 09/09/2013 1233   NITRITE NEGATIVE 09/05/2015 2048   NITRITE neg 09/09/2013 1233   LEUKOCYTESUR TRACE* 09/05/2015 2048   Sepsis Labs: @LABRCNTIP (procalcitonin:4,lacticidven:4) ) Recent Results (from the past 240 hour(s))  MRSA PCR Screening     Status: None   Collection Time: 08/29/15  2:20 PM  Result Value Ref Range Status   MRSA by PCR NEGATIVE NEGATIVE Final    Comment:        The GeneXpert MRSA Assay (FDA approved for NASAL specimens only), is one component of a comprehensive MRSA colonization surveillance program. It is not intended to diagnose MRSA infection nor to guide or monitor treatment for MRSA infections.   Culture, blood (routine x 2)     Status: None   Collection Time: 08/30/15  1:25 PM  Result Value Ref Range Status   Specimen Description BLOOD RIGHT ANTECUBITAL  Final   Special Requests IN PEDIATRIC BOTTLE 2CC  Final   Culture NO GROWTH 5 DAYS  Final   Report Status 09/04/2015 FINAL  Final  Culture, blood (routine x 2)     Status: None   Collection Time: 08/30/15  1:30 PM  Result Value Ref Range Status   Specimen Description BLOOD RIGHT ANTECUBITAL  Final   Special Requests IN PEDIATRIC BOTTLE 3CC  Final   Culture NO GROWTH 5 DAYS  Final   Report Status 09/04/2015 FINAL  Final  Body fluid culture     Status: None   Collection Time: 08/30/15  5:17 PM  Result Value Ref Range Status   Specimen Description FLUID  Final    Special Requests INTERVERTEBRAL DISC  Final   Gram Stain   Final    RARE WBC PRESENT,BOTH PMN AND MONONUCLEAR NO ORGANISMS SEEN    Culture No growth aerobically or anaerobically.  Final   Report Status 09/04/2015 FINAL  Final     Scheduled Meds: . allopurinol  300 mg Oral Q lunch  . amLODipine  10 mg  Oral Q supper  . antiseptic oral rinse  7 mL Mouth Rinse BID  . aspirin EC  81 mg Oral Q lunch  . atorvastatin  20 mg Oral q1800  . calcitRIOL  0.25 mcg Oral Q M,W,F  . cefTRIAXone (ROCEPHIN)  IV  2 g Intravenous Q24H  . cycloSPORINE  1 drop Both Eyes BID  . feeding supplement (GLUCERNA SHAKE)  237 mL Oral TID BM  . finasteride  5 mg Oral QHS  . insulin aspart  0-20 Units Subcutaneous TID WC  . insulin aspart  0-5 Units Subcutaneous QHS  . insulin glargine  6 Units Subcutaneous QHS  . metoprolol  100 mg Oral BID  . mometasone-formoterol  2 puff Inhalation BID  . montelukast  10 mg Oral QHS  . pilocarpine  1 drop Both Eyes TID  . polyethylene glycol  17 g Oral BID  . potassium chloride  10 mEq Intravenous Once  . predniSONE  7 mg Oral Q breakfast  . senna-docusate  2 tablet Oral BID  . sodium chloride flush  10-40 mL Intracatheter Q12H  . tamsulosin  0.4 mg Oral QHS  . traZODone  100 mg Oral QHS  . vancomycin  500 mg Intravenous Q12H   Continuous Infusions: . sodium chloride 50 mL/hr at 09/05/15 2132    Procedures/Studies: Mr Brain Wo Contrast  08/29/2015  CLINICAL DATA:  Altered mental status. Patient threatening himself and others. Lethargy. Evaluation for stroke. EXAM: MRI HEAD WITHOUT CONTRAST TECHNIQUE: Multiplanar, multiecho pulse sequences of the brain and surrounding structures were obtained without intravenous contrast. COMPARISON:  08/03/2015 head CT FINDINGS: The study is mildly motion degraded. There is no evidence of acute infarct, intracranial hemorrhage, mass, midline shift, or extra-axial fluid collection. Cerebral atrophy is within normal limits for age. Patchy  cerebral white matter T2 hyperintensities are nonspecific but compatible with mild-to-moderate chronic small vessel ischemic disease. Prior bilateral cataract extraction is noted. Paranasal sinuses and mastoid air cells are clear. Major intracranial vascular flow voids are preserved. IMPRESSION: 1. No acute intracranial abnormality. 2. Mild-to-moderate chronic small vessel ischemic disease. Electronically Signed   By: Logan Bores M.D.   On: 08/29/2015 13:03   Mr Lumbar Spine W Wo Contrast  08/30/2015  ADDENDUM REPORT: 08/30/2015 09:27 ADDENDUM: Study discussed by telephone with Imelda Pillow, Medical Assitant for Dr. Ashok Pall on 08/30/2015 at 0912 hours. Electronically Signed   By: Genevie Ann M.D.   On: 08/30/2015 09:27  08/30/2015  CLINICAL DATA:  75 year old male with lumbar back pain radiating to the left lower extremity status post lumbar spine surgery 07/30/2015. Subsequent encounter. EXAM: MRI LUMBAR SPINE WITHOUT AND WITH CONTRAST TECHNIQUE: Multiplanar and multiecho pulse sequences of the lumbar spine were obtained without and with intravenous contrast. CONTRAST:  29m MULTIHANCE GADOBENATE DIMEGLUMINE 529 MG/ML IV SOLN COMPARISON:  Outside preoperative lumbar MRI 07/24/2015. Intraoperative radiographs 07/30/2015. FINDINGS: Segmentation:  Normal as seen on the intraoperative radiographs. Alignment: Interval mild L4 superior endplate deformity with mild (5-10%) loss of L4 vertebral body height. Associated mild endplate edema there are and at the adjacent L3 inferior endplate. Postoperative changes at this level further described below. Stable vertebral height and alignment elsewhere. Vertebrae: Endplate marrow edema at L3 and L4 as stated. Stable and normal bone marrow signal elsewhere. Conus medullaris: Extends to the L1-L2 level and appears normal. No lower thoracic spinal cord signal abnormality. Paraspinal and other soft tissues: Postoperative changes in the posterior paraspinal soft tissues from  the L2 spinous process level to the  L4 spinous process level. Medial bilateral erector spinae muscle edema and enhancement. Enhancement tracks from the skin surface to the epidural space at L3-L4. There is a a small 15 mm fluid collection overlying the L3 spinous process. See also L3-L4 disc space findings below. No definite prevertebral soft tissue inflammation. There is mild right greater than left medial psoas muscle inflammation and enhancement (series 6, image 1). Disc levels: The disc space levels L1-L2 and above are stable. The L4-L5 and L5-S1 levels are stable. L2-L3:  Stable. L3-L4: Large cephalad disc extrusion with disc fragment encompassing 7 x 11 x 24 mm (AP by transverse by CC). See series 9, image 12 and series 4, image 8. The L3-L4 disc space now appears to be fluid-filled. The endplates are mildly edematous and enhancing. Moderate facet and ligament flavum hypertrophy Re demonstrated. Postoperative changes possibly to the ligament flavum. Subsequent severe spinal stenosis (series 9, image 14). IMPRESSION: 1. Postoperative changes at the L3-L4 level with superimposed large cephalad disc extrusion (series 4, image 8) causing severe spinal stenosis, as well as abnormal fluid signal in the L3-L4 disc space associated with mild L4 superior endplate fracture with L3 and L4 endplate edema/inflammation. Mild associated psoas muscle inflammation. 2. The appearance is such that discitis osteomyelitis at L3-L4 cannot be excluded. 3. There is also a small postoperative fluid collection overlying the L3 spinous process, favor seroma. Electronically Signed: By: Genevie Ann M.D. On: 08/29/2015 13:39   Ir Fluoro Guide Ndl Plmt / Bx  09/04/2015  INDICATION: Lumbar discitis. EXAM: IR FLUORO GUIDE NEEDLE PLACEMENT /BIOPSY AT L3-L4 MEDICATIONS: Versed 2 mg IV.  Fentanyl 50 mcg IV. ANESTHESIA/SEDATION: Moderate (conscious) sedation was employed during this procedure. A total of Versed 2 mg and Fentanyl 50 mcg was  administered intravenously. Moderate Sedation Time: 13 minutes. The patient's level of consciousness and vital signs were monitored continuously by radiology nursing throughout the procedure under my direct supervision. FLUOROSCOPY TIME:  Fluoroscopy Time: 2 minutes 36 seconds (207 mGy). COMPLICATIONS: None immediate. PROCEDURE: Informed written consent was obtained from the patient after a thorough discussion of the procedural risks, benefits and alternatives. All questions were addressed. Maximal Sterile Barrier Technique was utilized including caps, mask, sterile gowns, sterile gloves, sterile drape, hand hygiene and skin antiseptic. A timeout was performed prior to the initiation of the procedure. The patient was laid prone on the fluoroscopic table. The skin overlying the lumbar region was then prepped and draped in the usual sterile fashion. Skin entry site at the L3-L4 paraspinous region was then infiltrated with 0.25% bupivacaine and carried into the paraspinous muscles. Thereafter, using biplane intermittent fluoroscopy, a 21 gauge Francine biopsy needle was then advanced into the L3-L4 disc space without difficulty. Using a 20 mL syringe, a fleck of bloody aspirate was obtained with two passes of two separate 21 gauge Francine needles. Approximately 1.5 cc of aspirate was obtained in total. Hemostasis was achieved at the skin entry site. The specimens were then sent for microbiology testing as per request. The patient tolerated the procedure well with no acute complications. IMPRESSION: Status post fluoroscopic guided needle placement with aspiration of the L3-L4 disc space retrieving a total of 1.5 cc of thick bloody aspirate. Electronically Signed   By: Luanne Bras M.D.   On: 08/30/2015 15:23    Shandrea Lusk, DO  [facility] Pager [phone]  If 7PM-7AM, please contact night-coverage [email or web address] Password [facility] 09/06/2015, 4:27 PM   LOS: 9 days

## 2015-09-06 NOTE — Progress Notes (Signed)
Patient received from PACU at 2200. Patient responds to voice and MAEW. VSS. O2 at 2L/min. Honeycomb dressing to lumbar incision CDI. Family currently at bedside. Will monitor closely overnight.

## 2015-09-06 NOTE — Progress Notes (Signed)
Patient's daughter requested the Hospitalist to call her when he can, MD paged to notify

## 2015-09-06 NOTE — Anesthesia Preprocedure Evaluation (Addendum)
Anesthesia Evaluation  Patient identified by MRN, date of birth, ID band Patient awake    Reviewed: Allergy & Precautions, NPO status , Patient's Chart, lab work & pertinent test results  Airway Mallampati: II  TM Distance: >3 FB Neck ROM: Full    Dental no notable dental hx.    Pulmonary shortness of breath, asthma , sleep apnea , pneumonia, COPD,  COPD inhaler, former smoker,    Pulmonary exam normal breath sounds clear to auscultation       Cardiovascular hypertension, Pt. on medications and Pt. on home beta blockers + CAD and +CHF  Normal cardiovascular exam Rhythm:Regular Rate:Normal  ECG: 08-29-15: ST 105 with PACs  ECHO 08-29-15: Study Conclusions  - Left ventricle: The cavity size was mildly dilated. There was  mild concentric hypertrophy. Systolic function was normal. The  estimated ejection fraction was in the range of 55% to 60%.  Doppler parameters are consistent with abnormal left ventricular  relaxation (grade 1 diastolic dysfunction). - Aortic valve: Transvalvular velocity was within the normal range.  There was no stenosis. There was no regurgitation. - Mitral valve: Transvalvular velocity was within the normal range.  There was no evidence for stenosis. There was no regurgitation. - Right ventricle: The cavity size was normal. Wall thickness was  normal. Systolic function was normal. - Tricuspid valve: There was trivial regurgitation. - Pulmonary arteries: Systolic pressure was within the normal  range. PA peak pressure: 23 mm Hg (S).   Neuro/Psych PSYCHIATRIC DISORDERS Anxiety negative neurological ROS     GI/Hepatic negative GI ROS, (+) Hepatitis -  Endo/Other  diabetes, Type 2, Oral Hypoglycemic Agents  Renal/GU Renal disease  negative genitourinary   Musculoskeletal  (+) Arthritis ,   Abdominal   Peds negative pediatric ROS (+)  Hematology  (+) anemia ,   Anesthesia Other  Findings   Reproductive/Obstetrics negative OB ROS                           Anesthesia Physical Anesthesia Plan  ASA: III  Anesthesia Plan: General   Post-op Pain Management:    Induction: Intravenous  Airway Management Planned: Oral ETT  Additional Equipment:   Intra-op Plan:   Post-operative Plan: Extubation in OR  Informed Consent: I have reviewed the patients History and Physical, chart, labs and discussed the procedure including the risks, benefits and alternatives for the proposed anesthesia with the patient or authorized representative who has indicated his/her understanding and acceptance.   Dental advisory given  Plan Discussed with: CRNA  Anesthesia Plan Comments: (07-30-15:  Airway: MAC 4 times one; grade 1 view.)       Anesthesia Quick Evaluation

## 2015-09-06 NOTE — Progress Notes (Signed)
Called patient's daughter to let her know that patient's surgery will be this afternoon per MD. But no specific at this time.

## 2015-09-06 NOTE — Transfer of Care (Signed)
Immediate Anesthesia Transfer of Care Note  Patient: Jacob [name]  Procedure(s) Performed: Procedure(s): Redo L3/4 Disectomy (N/A)  Patient Location: PACU  Anesthesia Type:General  Level of Consciousness: sedated  Airway & Oxygen Therapy: Patient connected to nasal cannula oxygen  Post-op Assessment: Report given to RN and Post -op Vital signs reviewed and stable  Post vital signs: Reviewed and stable  Last Vitals:  Filed Vitals:   09/06/15 0927 09/06/15 1420  BP: 169/76 158/72  Pulse: 77 85  Temp: 36.9 C 37 C  Resp: 18 19    Last Pain:  Filed Vitals:   09/06/15 1441  PainSc: Asleep      Patients Stated Pain Goal: 2 ([id number])  Complications: No apparent anesthesia complications

## 2015-09-06 NOTE — Anesthesia Procedure Notes (Signed)
Procedure Name: Intubation Date/Time: 09/06/2015 6:36 PM Performed by: Ignacia Bayley Pre-anesthesia Checklist: Patient identified, Emergency Drugs available, Suction available and Patient being monitored Patient Re-evaluated:Patient Re-evaluated prior to inductionOxygen Delivery Method: Circle system utilized Preoxygenation: Pre-oxygenation with 100% oxygen Intubation Type: IV induction Ventilation: Mask ventilation without difficulty Laryngoscope Size: Miller and 2 Grade View: Grade I Tube type: Oral Tube size: 7.5 mm Number of attempts: 1 Airway Equipment and Method: Stylet Placement Confirmation: ETT inserted through vocal cords under direct vision,  positive ETCO2 and breath sounds checked- equal and bilateral Secured at: 21 cm Tube secured with: Tape Dental Injury: Teeth and Oropharynx as per pre-operative assessment

## 2015-09-06 NOTE — Op Note (Signed)
08/28/2015 - 09/06/2015  8:37 PM  PATIENT:  Jacob [name]  75 y.o. male with a recurrent disc herniation at L3/4  PRE-OPERATIVE DIAGNOSIS: recurrent HNP L3/4  POST-OPERATIVE DIAGNOSIS: recurrent HNP L3/4  PROCEDURE:  Procedure(s): Redo L3/4 Disectomy  SURGEON:   Surgeon(s): Ashok Pall, MD  ASSISTANTS:none  ANESTHESIA:   general  EBL:  Total I/O In: 1200 [I.V.:1200] Out: 350 [Blood:350]  BLOOD ADMINISTERED:none  CELL SAVER GIVEN:none  COUNT:per nursing  DRAINS: none   SPECIMEN:  Source of Specimen:  l3/4 disc space  DICTATION: [name] was taken to the operating room, intubated and placed under a general anesthetic without difficulty. He was positioned prone on a Wilson frame with all pressure points padded. His back was prepped and draped in a sterile manner. I opened the skin with a 10 blade and carried the dissection down to the thoracolumbar fascia. I used both sharp dissection and the monopolar cautery to expose the lamina of L3, and L4. I confirmed my location with an intraoperative xray.  I used the drill, Kerrison punches, and curettes to perform a semihemilaminectomy of L3. I used the punches to remove the ligamentum flavum to expose the thecal sac. I brought the microscope into the operative field. I started my decompression of the spinal canal, thecal sac and L4 root(s).I used curettes to remove the scar tissue. I then used the Kerrison punches to remove ligament to expose the thecal sac. I retracted the thecal sac medially and encountered disc material rostral to the disc and in the disc space.  I used pituitary rongeurs, curettes, and other instruments to remove disc material. After the discectomy was completed I inspected the L4 nerve root and felt it was well decompressed. I explored rostrally, laterally, medially, and caudally and was satisfied with the decompression. I irrigated the wound, then closed in layers. I approximated the thoracolumbar fascia,  subcutaneous, and subcuticular planes with vicryl sutures. I used dermabond for a sterile dressing.   PLAN OF CARE: Admit to inpatient   PATIENT DISPOSITION:  PACU - hemodynamically stable.   Delay start of Pharmacological VTE agent (>24hrs) due to surgical blood loss or risk of bleeding:  yes

## 2015-09-06 NOTE — NC FL2 (Signed)
[place] LEVEL OF CARE SCREENING TOOL     IDENTIFICATION  Patient Name: Jacob [name] Birthdate: [birth year] Sex: male Admission Date (Current Location): 08/28/2015  [facility] and [place] Number:  Herbalist and Address:  [facility], [place] [address], [place], [place] [address]      Provider Number: [id number]  Attending Physician Name and Address:  Orson Eva, MD  Relative Name and Phone Number:  Bill, Wied [phone] or [phone] or Dove,Debra Daughter [phone]    Current Level of Care: Hospital Recommended Level of Care: [place] Prior Approval Number:    Date Approved/Denied:   PASRR Number: [id number]  Discharge Plan: SNF    Current Diagnoses: Patient Active Problem List   Diagnosis Date Noted  . Low back pain   . Discitis   . Anorexia   . Adjustment disorder with mixed emotional features 09/01/2015  . Controlled diabetes mellitus type 2 with complications ([place])   . Acute encephalopathy 08/29/2015  . Elevated troponin 08/29/2015  . Elevated LFTs 08/29/2015  . CAD in native artery   . Uncontrolled type 2 diabetes mellitus with complication ([place])   . Rheumatoid arthritis involving vertebra with positive rheumatoid factor ([place])   . Spinal stenosis of lumbar region   . Renal cancer ([place]) 08/28/2015  . Spinal stenosis of lumbar region with radiculopathy 08/07/2015  . Altered mental status 08/03/2015  . HCAP (healthcare-associated pneumonia) 08/03/2015  . History of COPD   . Preoperative respiratory examination 07/27/2015  . OSA (obstructive sleep apnea) 07/27/2015  . Renal insufficiency 07/27/2015  . HNP (herniated nucleus pulposus), lumbar 07/24/2015  . Right kidney mass   . CHF with left ventricular diastolic dysfunction, NYHA class 2 ([place]) 01/28/2014  . PMR (polymyalgia rheumatica) ([place]) 09/09/2013  . Rheumatoid arthritis ([place]) 04/13/2012  . CAD (coronary artery disease)  12/19/2011  . Diabetes mellitus, type 2 ([place]) 12/19/2011  . Neuropathy ([place]) 12/19/2011    Orientation RESPIRATION BLADDER Height & Weight     Self, Place  Normal Continent, Incontinent Weight: 212 lb (96.163 kg) Height:  5\' 11"  (180.3 cm)  BEHAVIORAL SYMPTOMS/MOOD NEUROLOGICAL BOWEL NUTRITION STATUS      Continent Diet  AMBULATORY STATUS COMMUNICATION OF NEEDS Skin   Limited Assist Verbally Surgical wounds                       Personal Care Assistance Level of Assistance  Bathing, Dressing Bathing Assistance: Limited assistance   Dressing Assistance: Limited assistance     Functional Limitations Info  Sight, Hearing, Speech Sight Info: Adequate Hearing Info: Adequate Speech Info: Adequate    SPECIAL CARE FACTORS FREQUENCY                   Contractures      Additional Factors Info  Allergies, Code Status, Insulin Sliding Scale   Allergies Info: ACE INHIBITORS, ACTONEL, CIPROCINONIDE, FLUNISOLIDE, METFORMIN AND RELATED, SERTRALINE, SULINDAC, TERAZOSIN   Insulin Sliding Scale Info: 3x a day       Current Medications (09/06/2015):  This is the current hospital active medication list Current Facility-Administered Medications  Medication Dose Route Frequency Provider Last Rate Last Dose  . 0.9 %  sodium chloride infusion   Intravenous Continuous Allie Bossier, MD 50 mL/hr at 09/05/15 2132    . 0.9 % irrigation (POUR BTL)    PRN Ashok Pall, MD   1,000 mL at 09/06/15 1757  . acetaminophen (TYLENOL) tablet 650  mg  650 mg Oral Q6H PRN Toy Baker, MD   650 mg at 09/04/15 1709   Or  . acetaminophen (TYLENOL) suppository 650 mg  650 mg Rectal Q6H PRN Toy Baker, MD      . allopurinol (ZYLOPRIM) tablet 300 mg  300 mg Oral Q lunch Toy Baker, MD   300 mg at 09/06/15 1154  . amLODipine (NORVASC) tablet 10 mg  10 mg Oral Q supper Toy Baker, MD   10 mg at 09/05/15 1800  . antiseptic oral rinse (CPC / CETYLPYRIDINIUM CHLORIDE 0.05%)  solution 7 mL  7 mL Mouth Rinse BID Etta Quill, DO   7 mL at 09/06/15 0916  . aspirin EC tablet 81 mg  81 mg Oral Q lunch Toy Baker, MD   81 mg at 09/04/15 1216  . atorvastatin (LIPITOR) tablet 20 mg  20 mg Oral q1800 Allie Bossier, MD   20 mg at 09/05/15 1803  . calcitRIOL (ROCALTROL) capsule 0.25 mcg  0.25 mcg Oral Q M,W,F Toy Baker, MD   0.25 mcg at 09/06/15 0916  . ceFAZolin (ANCEF) 2-4 GM/100ML-% IVPB           . cefTRIAXone (ROCEPHIN) 2 g in dextrose 5 % 50 mL IVPB  2 g Intravenous Q24H Allie Bossier, MD   2 g at 09/05/15 1930  . cycloSPORINE (RESTASIS) 0.05 % ophthalmic emulsion 1 drop  1 drop Both Eyes BID Toy Baker, MD   1 drop at 09/06/15 0916  . feeding supplement (GLUCERNA SHAKE) (GLUCERNA SHAKE) liquid 237 mL  237 mL Oral TID BM Allie Bossier, MD   237 mL at 09/05/15 1049  . finasteride (PROSCAR) tablet 5 mg  5 mg Oral QHS Toy Baker, MD   5 mg at 09/05/15 2126  . hemostatic agents    PRN Ashok Pall, MD   1 application at 0000000 [phone]  . insulin aspart (novoLOG) injection 0-20 Units  0-20 Units Subcutaneous TID WC Orson Eva, MD   4 Units at 09/06/15 1733  . insulin aspart (novoLOG) injection 0-5 Units  0-5 Units Subcutaneous QHS Orson Eva, MD   2 Units at 09/04/15 2323  . insulin glargine (LANTUS) injection 6 Units  6 Units Subcutaneous QHS Cherene Altes, MD   6 Units at 09/05/15 2126  . ipratropium-albuterol (DUONEB) 0.5-2.5 (3) MG/3ML nebulizer solution 3 mL  3 mL Nebulization Q4H PRN Cherene Altes, MD      . labetalol (NORMODYNE,TRANDATE) injection 10 mg  10 mg Intravenous Q2H PRN Toy Baker, MD   10 mg at 08/29/15 0504  . lidocaine-EPINEPHrine 0.5 %-1:200000 (with pres) injection    PRN Ashok Pall, MD   10 mL at 09/06/15 1756  . methocarbamol (ROBAXIN) 500 mg in dextrose 5 % 50 mL IVPB  500 mg Intravenous Q8H PRN Cherene Altes, MD      . metoprolol (LOPRESSOR) tablet 100 mg  100 mg Oral BID Cherene Altes, MD    100 mg at 09/06/15 0915  . mometasone-formoterol (DULERA) 200-5 MCG/ACT inhaler 2 puff  2 puff Inhalation BID Toy Baker, MD   2 puff at 09/05/15 2049  . montelukast (SINGULAIR) tablet 10 mg  10 mg Oral QHS Toy Baker, MD   10 mg at 09/05/15 2125  . morphine 2 MG/ML injection 1-2 mg  1-2 mg Intravenous Q3H PRN Cherene Altes, MD   2 mg at 09/06/15 1645  . ondansetron (ZOFRAN) tablet 4 mg  4 mg Oral Q6H  PRN Toy Baker, MD   4 mg at 09/01/15 1044   Or  . ondansetron (ZOFRAN) injection 4 mg  4 mg Intravenous Q6H PRN Toy Baker, MD   4 mg at 09/02/15 1309  . pilocarpine (PILOCAR) 2 % ophthalmic solution 1 drop  1 drop Both Eyes TID Toy Baker, MD   1 drop at 09/06/15 0916  . polyethylene glycol (MIRALAX / GLYCOLAX) packet 17 g  17 g Oral BID Toy Baker, MD   17 g at 09/05/15 2126  . potassium chloride 10 mEq in 100 mL IVPB  10 mEq Intravenous Once Shanon Brow Tat, MD      . predniSONE (DELTASONE) tablet 7 mg  7 mg Oral Q breakfast Allie Bossier, MD   7 mg at 09/06/15 0815  . senna-docusate (Senokot-S) tablet 2 tablet  2 tablet Oral BID Toy Baker, MD   2 tablet at 09/06/15 0915  . sodium chloride flush (NS) 0.9 % injection 10-40 mL  10-40 mL Intracatheter Q12H Allie Bossier, MD   10 mL at 09/03/15 2200  . sodium chloride flush (NS) 0.9 % injection 10-40 mL  10-40 mL Intracatheter PRN Allie Bossier, MD   10 mL at 09/06/15 0559  . tamsulosin (FLOMAX) capsule 0.4 mg  0.4 mg Oral QHS Toy Baker, MD   0.4 mg at 09/05/15 2125  . thrombin spray    PRN Ashok Pall, MD   10,000 Units at 09/06/15 1757  . traMADol (ULTRAM) tablet 50 mg  50 mg Oral Q6H PRN Cherene Altes, MD   50 mg at 09/06/15 1038  . traZODone (DESYREL) tablet 100 mg  100 mg Oral QHS Corena Pilgrim, MD   100 mg at 09/05/15 2125  . vancomycin (VANCOCIN) 500 mg in sodium chloride 0.9 % 100 mL IVPB  500 mg Intravenous Q12H Orson Eva, MD   500 mg at 09/06/15 [id number]    Facility-Administered Medications Ordered in Other Encounters  Medication Dose Route Frequency Provider Last Rate Last Dose  . 0.9 %  sodium chloride infusion    Continuous PRN Ignacia Bayley, CRNA         Discharge Medications: Please see discharge summary for a list of discharge medications.  Relevant Imaging Results:  Relevant Lab Results:   Additional Information SSN [id number] Patient will need 6 weeks of IV antibiotics  Vennesa Bastedo, Jones Broom, LCSWA

## 2015-09-06 NOTE — Anesthesia Postprocedure Evaluation (Signed)
Anesthesia Post Note  Patient: Jacob [name]  Procedure(s) Performed: Procedure(s) (LRB): Redo L3/4 Disectomy (N/A)  Patient location during evaluation: PACU Anesthesia Type: General Level of consciousness: awake and alert and patient cooperative Pain management: pain level controlled Vital Signs Assessment: post-procedure vital signs reviewed and stable Respiratory status: spontaneous breathing, nonlabored ventilation, respiratory function stable and patient connected to nasal cannula oxygen Cardiovascular status: blood pressure returned to baseline and stable Postop Assessment: no signs of nausea or vomiting Anesthetic complications: no    Last Vitals:  Filed Vitals:   09/06/15 2117 09/06/15 2125  BP: 168/91 180/95  Pulse: 82 85  Temp:    Resp: 24 14    Last Pain:  Filed Vitals:   09/06/15 2137  PainSc: 4                  Jered Heiny,E. Geana Walts

## 2015-09-06 NOTE — Care Management Important Message (Signed)
Important Message  Patient Details  Name: Jacob [name] MRN: [name] Date of Birth: [birth year]   Medicare Important Message Given:  Yes    Loann Quill 09/06/2015, 9:52 AM

## 2015-09-07 DIAGNOSIS — M4806 Spinal stenosis, lumbar region: Secondary | ICD-10-CM | POA: Diagnosis not present

## 2015-09-07 DIAGNOSIS — M4647 Discitis, unspecified, lumbosacral region: Secondary | ICD-10-CM | POA: Diagnosis not present

## 2015-09-07 DIAGNOSIS — M4646 Discitis, unspecified, lumbar region: Secondary | ICD-10-CM | POA: Diagnosis not present

## 2015-09-07 DIAGNOSIS — I2510 Atherosclerotic heart disease of native coronary artery without angina pectoris: Secondary | ICD-10-CM

## 2015-09-07 DIAGNOSIS — R7989 Other specified abnormal findings of blood chemistry: Secondary | ICD-10-CM | POA: Diagnosis not present

## 2015-09-07 DIAGNOSIS — E118 Type 2 diabetes mellitus with unspecified complications: Secondary | ICD-10-CM | POA: Diagnosis not present

## 2015-09-07 DIAGNOSIS — F4323 Adjustment disorder with mixed anxiety and depressed mood: Secondary | ICD-10-CM | POA: Diagnosis not present

## 2015-09-07 DIAGNOSIS — M5126 Other intervertebral disc displacement, lumbar region: Secondary | ICD-10-CM

## 2015-09-07 DIAGNOSIS — G9340 Encephalopathy, unspecified: Secondary | ICD-10-CM | POA: Diagnosis not present

## 2015-09-07 LAB — BASIC METABOLIC PANEL
Anion gap: 6 (ref 5–15)
BUN: 15 mg/dL (ref 6–20)
CO2: 26 mmol/L (ref 22–32)
Calcium: 8.8 mg/dL — ABNORMAL LOW (ref 8.9–10.3)
Chloride: 107 mmol/L (ref 101–111)
Creatinine, Ser: 1.22 mg/dL (ref 0.61–1.24)
GFR calc Af Amer: >60 mL/min (ref >60)
GFR calc non Af Amer: 56 mL/min — ABNORMAL LOW (ref >60)
Glucose, Bld: 195 mg/dL — ABNORMAL HIGH (ref 65–99)
Potassium: 3.4 mmol/L — ABNORMAL LOW (ref 3.5–5.1)
Sodium: 139 mmol/L (ref 135–145)

## 2015-09-07 LAB — URINE CULTURE: Culture: >=100000 — AB

## 2015-09-07 LAB — CBC
HCT: 32 % — ABNORMAL LOW (ref 39.0–52.0)
Hemoglobin: 10.3 g/dL — ABNORMAL LOW (ref 13.0–17.0)
MCH: 29.6 pg (ref 26.0–34.0)
MCHC: 32.2 g/dL (ref 30.0–36.0)
MCV: 92 fL (ref 78.0–100.0)
Platelets: 255 10*3/uL (ref 150–400)
RBC: 3.48 MIL/uL — ABNORMAL LOW (ref 4.22–5.81)
RDW: 14.9 % (ref 11.5–15.5)
WBC: 8.6 10*3/uL (ref 4.0–10.5)

## 2015-09-07 LAB — GLUCOSE, CAPILLARY
Glucose-Capillary: 148 mg/dL — ABNORMAL HIGH (ref 65–99)
Glucose-Capillary: 190 mg/dL — ABNORMAL HIGH (ref 65–99)
Glucose-Capillary: 184 mg/dL — ABNORMAL HIGH (ref 65–99)
Glucose-Capillary: 229 mg/dL — ABNORMAL HIGH (ref 65–99)

## 2015-09-07 LAB — MAGNESIUM
Magnesium: 1.7 mg/dL (ref 1.7–2.4)
Magnesium: 1.6 mg/dL — ABNORMAL LOW (ref 1.7–2.4)

## 2015-09-07 NOTE — Progress Notes (Signed)
Marked confusion Moving legs well Incision looks good Medical management of delirium

## 2015-09-07 NOTE — Progress Notes (Signed)
PROGRESS NOTE  Jacob [name]:[id number] DOB: 1940-11-06 DOA: 08/28/2015 PCP: [name], MD  Brief History:  75 y.o. WM PMHx Anxiety, Lumbar stenosis S/P laminectomy and Discectomy Jul 30, 2015 by Dr Ashok Pall., COPD, RA, HTN, OSA, CAD native artery S/P CABG 7 vessel, DM Type 2 uncontrolled with complication, DM neuropathy, PMR, CKD, Cancer of kidney, Hepatitis  Presented with worsening encephalopathy for 24 hours He was discharged to home on June 8 from SNF. On 10th of June he started to get progressively worse. He was seen in office by Dr. Christella Noa and was doing better. But he started to threaten family he had psych eval at [facility] and was told to go to PCP. He went to ER at [facility] care. He was seen in [place] ED. patient had extensive workup chest x-ray was unremarkable UA was concentrated but no evidence of UTI. CT scan of the head was non-acute. U tox positive for benzos and opioids. Troponin was slightly elevated but no EKG changes suggestive of ischemia. MRI was planned. The patient was given an anxiolytic agent. Unfortunately, the patient became lethargic after the anxiolytic agent.MRI of the lumbar spine showed postoperative changes at L3-4 with superimposed cephalad disc extrusion causing severe spinal stenosis as well as abnormal fluid signal and L3-4 disc space. There was concern that this may represent early discitis/osteomyelitis. He was noted to have elevated LFTs ultrasound was done showing no evidence of cholecystitis. Patient was accepted in transfer to stepdown to [facility]   Regarding pertinent Chronic problems: In May patient had a fall in the bathtub MRI of the back show large herniated nucleus pulposus at L3-4 with severe lumbar stenosis. He has undergone lumbar laminectomy discectomy at the level L3/4 by Dr. Barrie Folk op course was complicated by pneumonia, acute renal failure and elevate troponins. He was d/ced to [facility] on 08/09/15. Blood  pressure at baseline controlled with amlodipine and Lopressor While in the nursing home he finished Augmentin for his age Pneumonia She has history of polymyalgia rheumatica for which she takes prednisone 7 mg daily Last echogram done in May 2017 showing preserved EF no evidence of diastolic dysfunction  Assessment/Plan: Acute encephalopathy -Multifactorial including infection, polypharmacy, sundowning and day/night reversion in the setting of dehydration -Patient continues to have waxing and waning symptoms -He is communicative and usually A&O x 2 -Discontinue Megace -TSH 1.709 -Serum B12 941 -Ammonia 13 -Urinalysis no pyuria -08/29/2015 MR brain negative for acute findings -08/03/2015 RPR negative  Depression with aggressive behavior -Previously threatening violence against family and self -Per family very depressed about current condition especially with wife also being ill. -09/01/2015--seen by psychiatry--did not feel patient needed inpatient psychiatric admission--started on trazodone 100 mg daily at bedtime  Coronary artery disease native artery -No anginal symptoms -Continue metoprolol tartrate 100 mg twice a day -Personally reviewed EKG--no concerning ischemic changes -Continue statin -Continue aspirin  Chronic diastolic CHF -Daily weights -08/29/2015 echo EF 55-60%, grade 1 DD -Clinically compensated -Neg 1.9 L since admission  Polymyalgia rheumatica -Continue daily prednisone 7 mg  Herniated nucleus pulposus with spinal stenosis -L3-4 discectomy 07/30/2015 -08/29/2015 MRI lumbar spine--postoperative change versus early discitis -Family initially did not want surgical intervention -08/30/2015 INR aspiration--culture negative -ID consulted--> seen by Dr. Baxter Flattery -PICC line placed and started on ceftriaxone and vancomycin D#9 of 42 -recheck ESR 90-->40 -recheck CRP 17.1-->0.7  Renal cell carcinoma -Status post ablation at [facility] -stable  Diabetes mellitus  type 2 with  hyperglycemia -08/29/15- hemoglobin A1c= 6.8 -Decrease Lantus 6 units daily -Resistant SSI  Elevated LFTs - outside hospital. Obtained ultrasound that showed no evidence of gallstone disease or cholecystitis possible dehydration related  Hypokalemia -repleted  Hypomagnesemia -repleted  Pain management  -Methocarbamol 500 mg TID -Morphine PRN   Disposition Plan:   SNF in 1-2 days when cleared by neurosurgery Family Communication:   Daughter updated on 6/23   Consultants:  Neurosurgery ([place]), ID Baxter Flattery)  Code Status:  FULL  DVT Prophylaxis:  SCDs   Procedures: As Listed in Progress Note Above  Antibiotics: None    Subjective: Patient was not confused, complains of back pain that is relieved by morphine. Denies any fevers, chills, headache, neck pain, chest pain, short of breath, nausea, vomiting, diarrhea, abdominal pain. No dysuria or hematuria. No rashes.  Objective: Filed Vitals:   09/06/15 2143 09/06/15 2208 09/07/15 0112 09/07/15 0505  BP:  168/77 163/81 165/89  Pulse:  88 98 97  Temp: 97.8 F (36.6 C) 97.9 F (36.6 C) 98 F (36.7 C)   TempSrc:  Oral Oral   Resp:  20 20 20   Height:      Weight:    103.465 kg (228 lb 1.6 oz)  SpO2:  100% 100% 95%    Intake/Output Summary (Last 24 hours) at 09/07/15 3818 Last data filed at 09/06/15 2024  Gross per 24 hour  Intake   1200 ml  Output   1100 ml  Net    100 ml   Weight change:  Exam:   General:  Pt is alert, follows commands appropriately, not in acute distress  HEENT: No icterus, No thrush, No neck mass, [place]/AT  Cardiovascular: RRR, S1/S2, no rubs, no gallops  Respiratory: CTA bilaterally, no wheezing, no crackles, no rhonchi  Abdomen: Soft/+BS, non tender, non distended, no guarding  Extremities: No edema, No lymphangitis, No petechiae, No rashes, no synovitis   Data Reviewed: I have personally reviewed following labs and imaging studies Basic Metabolic Panel:  Recent  Labs Lab 09/01/15 0412 09/01/15 2221 09/02/15 0644 09/03/15 0245 09/04/15 0540 09/05/15 0445 09/06/15 0559 09/07/15 0500  NA 140  --  138 138  --  137 139  --   K 2.8* 3.2* 2.9* 4.0  --  3.6 3.4*  --   CL 104  --  103 104  --  104 105  --   CO2 28  --  28 26  --  27 26  --   GLUCOSE 60*  --  90 164*  --  209* 168*  --   BUN 10  --  8 10  --  14 12  --   CREATININE 1.14  --  1.31* 1.26*  --  1.24 1.32*  --   CALCIUM 9.2  --  9.3 9.3  --  9.0 9.1  --   MG 1.6* 1.7 1.6* 2.1 2.0 1.8 1.7 1.7   Liver Function Tests:  Recent Labs Lab 09/01/15 0412 09/02/15 0644 09/06/15 0559  AST 29 38 35  ALT 45 51 58  ALKPHOS 355* 348* 245*  BILITOT 0.7 0.7 0.9  PROT 5.8* 6.2* 5.7*  ALBUMIN 2.7* 2.8* 2.7*   No results for input(s): LIPASE, AMYLASE in the last 168 hours.  Recent Labs Lab 09/06/15 0559  AMMONIA 13   Coagulation Profile: No results for input(s): INR, PROTIME in the last 168 hours. CBC:  Recent Labs Lab 09/01/15 0412 09/02/15 0644 09/03/15 0245 09/05/15 0445  WBC 6.6 8.1 9.2 7.7  HGB 10.6* 11.4* 11.9* 10.8*  HCT 34.0* 36.1* 37.4* 33.7*  MCV 93.2 93.0 94.0 91.3  PLT 250 268 270 273   Cardiac Enzymes: No results for input(s): CKTOTAL, CKMB, CKMBINDEX, TROPONINI in the last 168 hours. BNP: Invalid input(s): POCBNP CBG:  Recent Labs Lab 09/06/15 1115 09/06/15 1635 09/06/15 2113 09/06/15 2206 09/07/15 0708  GLUCAP 145* 165* 117* 143* 148*   HbA1C: No results for input(s): HGBA1C in the last 72 hours. Urine analysis:    Component Value Date/Time   COLORURINE YELLOW 09/05/2015 2048   APPEARANCEUR CLOUDY* 09/05/2015 2048   LABSPEC 1.015 09/05/2015 2048   PHURINE 7.0 09/05/2015 2048   GLUCOSEU NEGATIVE 09/05/2015 2048   HGBUR NEGATIVE 09/05/2015 2048   BILIRUBINUR NEGATIVE 09/05/2015 2048   BILIRUBINUR neg 09/09/2013 1233   KETONESUR NEGATIVE 09/05/2015 2048   PROTEINUR >300* 09/05/2015 2048   PROTEINUR >=300 09/09/2013 1233   UROBILINOGEN 0.2  09/09/2013 1233   NITRITE NEGATIVE 09/05/2015 2048   NITRITE neg 09/09/2013 1233   LEUKOCYTESUR TRACE* 09/05/2015 2048   Sepsis Labs: @LABRCNTIP (procalcitonin:4,lacticidven:4) ) Recent Results (from the past 240 hour(s))  MRSA PCR Screening     Status: None   Collection Time: 08/29/15  2:20 PM  Result Value Ref Range Status   MRSA by PCR NEGATIVE NEGATIVE Final    Comment:        The GeneXpert MRSA Assay (FDA approved for NASAL specimens only), is one component of a comprehensive MRSA colonization surveillance program. It is not intended to diagnose MRSA infection nor to guide or monitor treatment for MRSA infections.   Culture, blood (routine x 2)     Status: None   Collection Time: 08/30/15  1:25 PM  Result Value Ref Range Status   Specimen Description BLOOD RIGHT ANTECUBITAL  Final   Special Requests IN PEDIATRIC BOTTLE 2CC  Final   Culture NO GROWTH 5 DAYS  Final   Report Status 09/04/2015 FINAL  Final  Culture, blood (routine x 2)     Status: None   Collection Time: 08/30/15  1:30 PM  Result Value Ref Range Status   Specimen Description BLOOD RIGHT ANTECUBITAL  Final   Special Requests IN PEDIATRIC BOTTLE 3CC  Final   Culture NO GROWTH 5 DAYS  Final   Report Status 09/04/2015 FINAL  Final  Body fluid culture     Status: None   Collection Time: 08/30/15  5:17 PM  Result Value Ref Range Status   Specimen Description FLUID  Final   Special Requests INTERVERTEBRAL DISC  Final   Gram Stain   Final    RARE WBC PRESENT,BOTH PMN AND MONONUCLEAR NO ORGANISMS SEEN    Culture No growth aerobically or anaerobically.  Final   Report Status 09/04/2015 FINAL  Final  Urine culture     Status: Abnormal   Collection Time: 09/05/15  8:48 PM  Result Value Ref Range Status   Specimen Description URINE, RANDOM  Final   Special Requests NONE  Final   Culture >=100,000 COLONIES/mL YEAST (A)  Final   Report Status 09/07/2015 FINAL  Final  Aerobic/Anaerobic Culture (surgical/deep  wound)     Status: None (Preliminary result)   Collection Time: 09/06/15  7:11 PM  Result Value Ref Range Status   Specimen Description TISSUE  Final   Special Requests LUMBAR PARASPINOUS SAMPLE A  Final   Gram Stain   Final    FEW WBC PRESENT,BOTH PMN AND MONONUCLEAR NO ORGANISMS SEEN    Culture PENDING  Incomplete  Report Status PENDING  Incomplete  Aerobic/Anaerobic Culture (surgical/deep wound)     Status: None (Preliminary result)   Collection Time: 09/06/15  7:39 PM  Result Value Ref Range Status   Specimen Description TISSUE  Final   Special Requests LUMBAR DISK SAMPLE B  Final   Gram Stain   Final    RARE WBC PRESENT, PREDOMINANTLY PMN NO ORGANISMS SEEN    Culture PENDING  Incomplete   Report Status PENDING  Incomplete     Scheduled Meds: . allopurinol  300 mg Oral Q lunch  . amLODipine  10 mg Oral Q supper  . antiseptic oral rinse  7 mL Mouth Rinse BID  . aspirin EC  81 mg Oral Q lunch  . atorvastatin  20 mg Oral q1800  . calcitRIOL  0.25 mcg Oral Q M,W,F  . cefTRIAXone (ROCEPHIN)  IV  2 g Intravenous Q24H  . cycloSPORINE  1 drop Both Eyes BID  . feeding supplement (GLUCERNA SHAKE)  237 mL Oral TID BM  . fentaNYL      . finasteride  5 mg Oral QHS  . insulin aspart  0-20 Units Subcutaneous TID WC  . insulin aspart  0-5 Units Subcutaneous QHS  . insulin glargine  6 Units Subcutaneous QHS  . metoprolol  100 mg Oral BID  . mometasone-formoterol  2 puff Inhalation BID  . montelukast  10 mg Oral QHS  . pilocarpine  1 drop Both Eyes TID  . polyethylene glycol  17 g Oral BID  . potassium chloride  10 mEq Intravenous Once  . predniSONE  7 mg Oral Q breakfast  . senna-docusate  2 tablet Oral BID  . sodium chloride flush  10-40 mL Intracatheter Q12H  . tamsulosin  0.4 mg Oral QHS  . traZODone  100 mg Oral QHS  . vancomycin  500 mg Intravenous Q12H   Continuous Infusions: . sodium chloride 50 mL/hr at 09/07/15 0059    Procedures/Studies: Mr Brain Wo  Contrast  08/29/2015  CLINICAL DATA:  Altered mental status. Patient threatening himself and others. Lethargy. Evaluation for stroke. EXAM: MRI HEAD WITHOUT CONTRAST TECHNIQUE: Multiplanar, multiecho pulse sequences of the brain and surrounding structures were obtained without intravenous contrast. COMPARISON:  08/03/2015 head CT FINDINGS: The study is mildly motion degraded. There is no evidence of acute infarct, intracranial hemorrhage, mass, midline shift, or extra-axial fluid collection. Cerebral atrophy is within normal limits for age. Patchy cerebral white matter T2 hyperintensities are nonspecific but compatible with mild-to-moderate chronic small vessel ischemic disease. Prior bilateral cataract extraction is noted. Paranasal sinuses and mastoid air cells are clear. Major intracranial vascular flow voids are preserved. IMPRESSION: 1. No acute intracranial abnormality. 2. Mild-to-moderate chronic small vessel ischemic disease. Electronically Signed   By: Logan Bores M.D.   On: 08/29/2015 13:03   Mr Lumbar Spine W Wo Contrast  08/30/2015  ADDENDUM REPORT: 08/30/2015 09:27 ADDENDUM: Study discussed by telephone with Imelda Pillow, Medical Assitant for Dr. Ashok Pall on 08/30/2015 at 0912 hours. Electronically Signed   By: Genevie Ann M.D.   On: 08/30/2015 09:27  08/30/2015  CLINICAL DATA:  75 year old male with lumbar back pain radiating to the left lower extremity status post lumbar spine surgery 07/30/2015. Subsequent encounter. EXAM: MRI LUMBAR SPINE WITHOUT AND WITH CONTRAST TECHNIQUE: Multiplanar and multiecho pulse sequences of the lumbar spine were obtained without and with intravenous contrast. CONTRAST:  93m MULTIHANCE GADOBENATE DIMEGLUMINE 529 MG/ML IV SOLN COMPARISON:  Outside preoperative lumbar MRI 07/24/2015. Intraoperative radiographs 07/30/2015. FINDINGS:  Segmentation:  Normal as seen on the intraoperative radiographs. Alignment: Interval mild L4 superior endplate deformity with mild  (5-10%) loss of L4 vertebral body height. Associated mild endplate edema there are and at the adjacent L3 inferior endplate. Postoperative changes at this level further described below. Stable vertebral height and alignment elsewhere. Vertebrae: Endplate marrow edema at L3 and L4 as stated. Stable and normal bone marrow signal elsewhere. Conus medullaris: Extends to the L1-L2 level and appears normal. No lower thoracic spinal cord signal abnormality. Paraspinal and other soft tissues: Postoperative changes in the posterior paraspinal soft tissues from the L2 spinous process level to the L4 spinous process level. Medial bilateral erector spinae muscle edema and enhancement. Enhancement tracks from the skin surface to the epidural space at L3-L4. There is a a small 15 mm fluid collection overlying the L3 spinous process. See also L3-L4 disc space findings below. No definite prevertebral soft tissue inflammation. There is mild right greater than left medial psoas muscle inflammation and enhancement (series 6, image 1). Disc levels: The disc space levels L1-L2 and above are stable. The L4-L5 and L5-S1 levels are stable. L2-L3:  Stable. L3-L4: Large cephalad disc extrusion with disc fragment encompassing 7 x 11 x 24 mm (AP by transverse by CC). See series 9, image 12 and series 4, image 8. The L3-L4 disc space now appears to be fluid-filled. The endplates are mildly edematous and enhancing. Moderate facet and ligament flavum hypertrophy Re demonstrated. Postoperative changes possibly to the ligament flavum. Subsequent severe spinal stenosis (series 9, image 14). IMPRESSION: 1. Postoperative changes at the L3-L4 level with superimposed large cephalad disc extrusion (series 4, image 8) causing severe spinal stenosis, as well as abnormal fluid signal in the L3-L4 disc space associated with mild L4 superior endplate fracture with L3 and L4 endplate edema/inflammation. Mild associated psoas muscle inflammation. 2. The  appearance is such that discitis osteomyelitis at L3-L4 cannot be excluded. 3. There is also a small postoperative fluid collection overlying the L3 spinous process, favor seroma. Electronically Signed: By: Genevie Ann M.D. On: 08/29/2015 13:39   Dg Lumbar Spine 1 View  09/06/2015  CLINICAL DATA:  LUMBAR LAMINECTOMY/DECOMPRESSION MICRODISCECTOMY 3/4 EXAM: LUMBAR SPINE - 1 VIEW COMPARISON:  MR lumbar spine 08/29/2015 FINDINGS: Single lateral intraoperative view of lumbar spine. Using the numbering convention of comparison MRI, there is a current tip probe posterior to the L3 vertebral body. IMPRESSION: Intraoperative view as appear Electronically Signed   By: Suzy Bouchard M.D.   On: 09/06/2015 21:06   Ir Fluoro Guide Ndl Plmt / Bx  09/04/2015  INDICATION: Lumbar discitis. EXAM: IR FLUORO GUIDE NEEDLE PLACEMENT /BIOPSY AT L3-L4 MEDICATIONS: Versed 2 mg IV.  Fentanyl 50 mcg IV. ANESTHESIA/SEDATION: Moderate (conscious) sedation was employed during this procedure. A total of Versed 2 mg and Fentanyl 50 mcg was administered intravenously. Moderate Sedation Time: 13 minutes. The patient's level of consciousness and vital signs were monitored continuously by radiology nursing throughout the procedure under my direct supervision. FLUOROSCOPY TIME:  Fluoroscopy Time: 2 minutes 36 seconds (207 mGy). COMPLICATIONS: None immediate. PROCEDURE: Informed written consent was obtained from the patient after a thorough discussion of the procedural risks, benefits and alternatives. All questions were addressed. Maximal Sterile Barrier Technique was utilized including caps, mask, sterile gowns, sterile gloves, sterile drape, hand hygiene and skin antiseptic. A timeout was performed prior to the initiation of the procedure. The patient was laid prone on the fluoroscopic table. The skin overlying the lumbar region was then  prepped and draped in the usual sterile fashion. Skin entry site at the L3-L4 paraspinous region was then  infiltrated with 0.25% bupivacaine and carried into the paraspinous muscles. Thereafter, using biplane intermittent fluoroscopy, a 21 gauge Francine biopsy needle was then advanced into the L3-L4 disc space without difficulty. Using a 20 mL syringe, a fleck of bloody aspirate was obtained with two passes of two separate 21 gauge Francine needles. Approximately 1.5 cc of aspirate was obtained in total. Hemostasis was achieved at the skin entry site. The specimens were then sent for microbiology testing as per request. The patient tolerated the procedure well with no acute complications. IMPRESSION: Status post fluoroscopic guided needle placement with aspiration of the L3-L4 disc space retrieving a total of 1.5 cc of thick bloody aspirate. Electronically Signed   By: Luanne Bras M.D.   On: 08/30/2015 15:23    Reino Lybbert, DO  [facility] Pager [phone]  If 7PM-7AM, please contact night-coverage [email or web address] Password [email or web address] 09/07/2015, 9:28 AM   LOS: 10 days

## 2015-09-08 DIAGNOSIS — G9340 Encephalopathy, unspecified: Secondary | ICD-10-CM | POA: Diagnosis not present

## 2015-09-08 DIAGNOSIS — F4323 Adjustment disorder with mixed anxiety and depressed mood: Secondary | ICD-10-CM | POA: Diagnosis not present

## 2015-09-08 DIAGNOSIS — M4806 Spinal stenosis, lumbar region: Secondary | ICD-10-CM | POA: Diagnosis not present

## 2015-09-08 DIAGNOSIS — M4647 Discitis, unspecified, lumbosacral region: Secondary | ICD-10-CM | POA: Diagnosis not present

## 2015-09-08 DIAGNOSIS — R7989 Other specified abnormal findings of blood chemistry: Secondary | ICD-10-CM | POA: Diagnosis not present

## 2015-09-08 DIAGNOSIS — I2510 Atherosclerotic heart disease of native coronary artery without angina pectoris: Secondary | ICD-10-CM | POA: Diagnosis not present

## 2015-09-08 DIAGNOSIS — M4646 Discitis, unspecified, lumbar region: Secondary | ICD-10-CM | POA: Diagnosis not present

## 2015-09-08 DIAGNOSIS — E118 Type 2 diabetes mellitus with unspecified complications: Secondary | ICD-10-CM | POA: Diagnosis not present

## 2015-09-08 LAB — MAGNESIUM: Magnesium: 1.7 mg/dL (ref 1.7–2.4)

## 2015-09-08 LAB — CBC
HCT: 31.9 % — ABNORMAL LOW (ref 39.0–52.0)
Hemoglobin: 10.1 g/dL — ABNORMAL LOW (ref 13.0–17.0)
MCH: 30 pg (ref 26.0–34.0)
MCHC: 31.7 g/dL (ref 30.0–36.0)
MCV: 94.7 fL (ref 78.0–100.0)
Platelets: 237 10*3/uL (ref 150–400)
RBC: 3.37 MIL/uL — ABNORMAL LOW (ref 4.22–5.81)
RDW: 14.9 % (ref 11.5–15.5)
WBC: 9.1 10*3/uL (ref 4.0–10.5)

## 2015-09-08 LAB — BASIC METABOLIC PANEL
Anion gap: 7 (ref 5–15)
BUN: 14 mg/dL (ref 6–20)
CO2: 24 mmol/L (ref 22–32)
Calcium: 8.7 mg/dL — ABNORMAL LOW (ref 8.9–10.3)
Chloride: 108 mmol/L (ref 101–111)
Creatinine, Ser: 1.17 mg/dL (ref 0.61–1.24)
GFR calc Af Amer: >60 mL/min (ref >60)
GFR calc non Af Amer: 59 mL/min — ABNORMAL LOW (ref >60)
Glucose, Bld: 191 mg/dL — ABNORMAL HIGH (ref 65–99)
Potassium: 3.4 mmol/L — ABNORMAL LOW (ref 3.5–5.1)
Sodium: 139 mmol/L (ref 135–145)

## 2015-09-08 LAB — GLUCOSE, CAPILLARY
Glucose-Capillary: 178 mg/dL — ABNORMAL HIGH (ref 65–99)
Glucose-Capillary: 210 mg/dL — ABNORMAL HIGH (ref 65–99)
Glucose-Capillary: 192 mg/dL — ABNORMAL HIGH (ref 65–99)
Glucose-Capillary: 189 mg/dL — ABNORMAL HIGH (ref 65–99)

## 2015-09-08 MED ORDER — TRAZODONE HCL 100 MG PO TABS
100.0000 mg | ORAL_TABLET | Freq: Every day | ORAL | Status: DC
Start: 2015-09-08 — End: 2015-11-29

## 2015-09-08 MED ORDER — DEXTROSE 5 % IV SOLN: 2.0000 g | INTRAVENOUS | Status: DC

## 2015-09-08 MED ORDER — MAGNESIUM SULFATE 2 GM/50ML IV SOLN
2.0000 g | Freq: Once | INTRAVENOUS | Status: AC
  Administered 2015-09-08: 2 g via INTRAVENOUS
  Filled 2015-09-08: qty 50

## 2015-09-08 MED ORDER — POTASSIUM CHLORIDE CRYS ER 10 MEQ PO TBCR
10.0000 meq | EXTENDED_RELEASE_TABLET | Freq: Once | ORAL | Status: AC
Start: 2015-09-08 — End: 2015-09-08
  Administered 2015-09-08: 10 meq via ORAL
  Filled 2015-09-08: qty 1

## 2015-09-08 MED ORDER — INSULIN GLARGINE 100 UNIT/ML ~~LOC~~ SOLN
10.0000 [IU] | Freq: Every day | SUBCUTANEOUS | Status: DC
  Administered 2015-09-08: 10 [IU] via SUBCUTANEOUS
  Filled 2015-09-08 (×2): qty 0.1

## 2015-09-08 MED ORDER — TRAMADOL HCL 50 MG PO TABS
50.0000 mg | ORAL_TABLET | Freq: Four times a day (QID) | ORAL | Status: DC | PRN
Start: 2015-09-08 — End: 2015-10-14

## 2015-09-08 MED ORDER — ATORVASTATIN CALCIUM 20 MG PO TABS: 20.0000 mg | ORAL_TABLET | Freq: Every day | ORAL | Status: DC

## 2015-09-08 MED ORDER — OXYCODONE HCL 5 MG PO TABS: 5.0000 mg | ORAL_TABLET | ORAL | Status: DC | PRN

## 2015-09-08 MED ORDER — METOPROLOL TARTRATE 100 MG PO TABS: 100.0000 mg | ORAL_TABLET | Freq: Two times a day (BID) | ORAL | Status: DC

## 2015-09-08 NOTE — Evaluation (Signed)
Physical Therapy Evaluation Patient Details Name: Jacob [name] MRN: [id number] DOB: [birth year] Today's Date: 09/08/2015   History of Present Illness  Jacob [name] is a 75 y.o. male with medical history significant of lumbar stenosis, COPD, rheumatoid arthritis, hypertension, sleep apnea, CAD, DM 2, neuropathy, PMR, admitted for acute encephalopathy. Pt had a L3/4 laminiectomy discectomy done in May and was d/c'd to [facility] and was then d/c'd home on 6/8. He then progressively declined both physically and cognitively. Pt then underwent a redo L3/4 discectomy on 6/23.  Clinical Impression  Pt admitted with above. Pt in sever pain limiting all mobility. Pt unable to tolerate rolling therefor inhibiting transition to EOB or OOB. RN notified and is going to give him morphine. At this time pt unsafe to return home and will most likely need to go back to SNF upon d/c to achieve supervision level of function.    Follow Up Recommendations SNF;Supervision/Assistance - 24 hour    Equipment Recommendations  None recommended by PT    Recommendations for Other Services       Precautions / Restrictions Precautions Precautions: Fall;Back Precaution Booklet Issued: No Precaution Comments: pt unable to comprehend that he had surgery, therefore couldn't process back precautions Restrictions Weight Bearing Restrictions: No      Mobility  Bed Mobility Overal bed mobility: Needs Assistance Bed Mobility: Rolling Rolling: Max assist         General bed mobility comments: pt unable to tolerate rolling, required maxA, pt in tears.  Transfers                    Ambulation/Gait                Stairs            Wheelchair Mobility    Modified Rankin (Stroke Patients Only)       Balance                                             Pertinent Vitals/Pain Pain Assessment: 0-10 Pain Score: 10-Worst pain ever Pain Location: back Pain  Descriptors / Indicators: Grimacing (yelling) Pain Intervention(s): Patient requesting pain meds-RN notified;Repositioned (RN to give pt morphine)    Home Living Family/patient expects to be discharged to:: Skilled nursing facility Living Arrangements: Spouse/significant other               Additional Comments: pt went to [facility] in may after first back surgery and then d/c'd home 6/8.    Prior Function           Comments: unsure how he was doing since d/c'd home 6/8. per chart pt was declining medically so I suspect he required assist     Hand Dominance   Dominant Hand: Right    Extremity/Trunk Assessment   Upper Extremity Assessment: Generalized weakness           Lower Extremity Assessment: Generalized weakness      Cervical / Trunk Assessment: Other exceptions  Communication   Communication: Expressive difficulties  Cognition Arousal/Alertness: Lethargic Behavior During Therapy: Agitated;Anxious Overall Cognitive Status: Impaired/Different from baseline Area of Impairment: Memory;Safety/judgement;Awareness     Memory: Decreased short-term memory   Safety/Judgement: Decreased awareness of safety;Decreased awareness of deficits Awareness: Intellectual (pt unsure why back hurts, didn't remember he had surgery aga)   General Comments: pt in so  much pain inhibiting pt's ability to focus on questions or task at hand    General Comments      Exercises        Assessment/Plan    PT Assessment Patient needs continued PT services  PT Diagnosis Difficulty walking;Acute pain;Generalized weakness   PT Problem List Decreased activity tolerance;Decreased balance;Decreased range of motion;Decreased strength;Decreased mobility;Decreased coordination;Decreased knowledge of use of DME;Decreased safety awareness;Pain  PT Treatment Interventions DME instruction;Gait training;Stair training;Functional mobility training;Therapeutic activities;Therapeutic  exercise;Balance training   PT Goals (Current goals can be found in the Care Plan section) Acute Rehab PT Goals Patient Stated Goal: stop the pain PT Goal Formulation: Patient unable to participate in goal setting Time For Goal Achievement: 09/22/15 Potential to Achieve Goals: Good    Frequency Min 5X/week   Barriers to discharge Decreased caregiver support spouse unable to physically lift    Co-evaluation               End of Session   Activity Tolerance: Patient limited by pain Patient left: in bed;with call bell/phone within reach;with bed alarm set Nurse Communication: Mobility status;Patient requests pain meds         Time: 0726-0742 PT Time Calculation (min) (ACUTE ONLY): 16 min   Charges:   PT Evaluation $PT Eval Moderate Complexity: 1 Procedure     PT G CodesKingsley Callander 09/08/2015, 11:18 AM   Kittie Plater, PT, DPT Pager #: [phone] Office #: [phone]

## 2015-09-08 NOTE — Progress Notes (Signed)
PROGRESS NOTE  Jacob [name] QAS:[id number] DOB: January 05, 1941 DOA: 08/28/2015 PCP: [name], MD  Brief History:  75 y.o. WM PMHx Anxiety, Lumbar stenosis S/P laminectomy and Discectomy Jul 30, 2015 by Dr Ashok Pall., COPD, RA, HTN, OSA, CAD native artery S/P CABG 7 vessel, DM Type 2 uncontrolled with complication, DM neuropathy, PMR, CKD, Cancer of kidney, Hepatitis  Presented with worsening encephalopathy for 24 hours He was discharged to home on June 8 from SNF. On 10th of June he started to get progressively worse. He was seen in office by Dr. Christella Noa and was doing better. But he started to threaten family he had psych eval at [facility] and was told to go to PCP. He went to ER at [facility] care. He was seen in [place] ED. patient had extensive workup chest x-ray was unremarkable UA was concentrated but no evidence of UTI. CT scan of the head was non-acute. U tox positive for benzos and opioids. Troponin was slightly elevated but no EKG changes suggestive of ischemia. MRI was planned. The patient was given an anxiolytic agent. Unfortunately, the patient became lethargic after the anxiolytic agent.MRI of the lumbar spine showed postoperative changes at L3-4 with superimposed cephalad disc extrusion causing severe spinal stenosis as well as abnormal fluid signal and L3-4 disc space. There was concern that this may represent early discitis/osteomyelitis. He was noted to have elevated LFTs ultrasound was done showing no evidence of cholecystitis. Patient was accepted in transfer to stepdown to [facility]   Regarding pertinent Chronic problems: In May patient had a fall in the bathtub MRI of the back show large herniated nucleus pulposus at L3-4 with severe lumbar stenosis. He has undergone lumbar laminectomy discectomy at the level L3/4 by Dr. Barrie Folk op course was complicated by pneumonia, acute renal failure and elevate troponins. He was d/ced to [facility] on 08/09/15. Blood  pressure at baseline controlled with amlodipine and Lopressor While in the nursing home he finished Augmentin for his age Pneumonia She has history of polymyalgia rheumatica for which she takes prednisone 7 mg daily Last echogram done in May 2017 showing preserved EF no evidence of diastolic dysfunction  Assessment/Plan: Acute encephalopathy -Multifactorial including infection, polypharmacy, sundowning and day/night reversion in the setting of dehydration -Patient continues to have waxing and waning symptoms; more alert today -He is communicative and usually A&O x 2 -Discontinue Megace -TSH 1.709 -Serum B12 941 -Ammonia 13 -Urinalysis no pyuria -08/29/2015 MR brain negative for acute findings -08/03/2015 RPR negative  Depression with aggressive behavior -Previously threatening violence against family and self -Per family very depressed about current condition especially with wife also being ill. -09/01/2015--seen by psychiatry--did not feel patient needed inpatient psychiatric admission--started on trazodone 100 mg daily at bedtime  Coronary artery disease native artery -No anginal symptoms -Continue metoprolol tartrate 100 mg twice a day -Personally reviewed EKG--no concerning ischemic changes -Continue statin -Continue aspirin  Chronic diastolic CHF -Daily weights -08/29/2015 echo EF 55-60%, grade 1 DD -Clinically compensated -Neg 2.6 L since admission  Polymyalgia rheumatica -Continue daily prednisone 7 mg  Herniated nucleus pulposus with spinal stenosis/early discitis -L3-4 discectomy 07/30/2015 -08/29/2015 MRI lumbar spine--postoperative change versus early discitis -Family initially did not want surgical intervention -08/30/2015 INR aspiration--culture negative -ID consulted--> seen by Dr. Baxter Flattery -PICC line placed and started on ceftriaxone and vancomycin D#10 of 42 -recheck ESR 90-->40 -recheck CRP 17.1-->0.7  Renal cell carcinoma -Status post ablation at  [facility] -stable  Diabetes  mellitus type 2 with hyperglycemia -08/29/15- hemoglobin A1c= 6.8 -Increase Lantus 10 units daily -Resistant SSI  Elevated LFTs -outside hospital. Obtained ultrasound that showed no evidence of gallstone disease or cholecystitis possible dehydration related  Hypokalemia -repleted -am BMP  Hypomagnesemia -repleted -am mag  Pain management  -Methocarbamol 500 mg TID -Morphine PRN   Disposition Plan: SNF 09/09/15 if cleared by neurosurgery Family Communication: Son and Daughter updated on 6/25--total time 35 min; >50% face to face time spent counseling and coordinating care   Consultants: Neurosurgery ([place]), ID Baxter Flattery)  Code Status: FULL  DVT Prophylaxis: SCDs   Subjective: Patient is less confused today.  complains of back pain but is well controlled. Denies any fevers, chills, chest pain, shortness breath, nausea, vomiting, diarrhea, abdominal pain pain medication or melena.  Objective: Filed Vitals:   09/08/15 0625 09/08/15 0848 09/08/15 1018 09/08/15 1321  BP: 161/72  129/62 128/64  Pulse: 84  91 74  Temp: 99 F (37.2 C)  99.1 F (37.3 C) 99.6 F (37.6 C)  TempSrc: Oral  Oral Oral  Resp: _0 Height:      Weight:      SpO2: 97% 98% 97% 99%    Intake/Output Summary (Last 24 hours) at 09/08/15 1643 Last data filed at 09/08/15 0857  Gross per 24 hour  Intake    480 ml  Output   1175 ml  Net   -695 ml   Weight change: -0.544 kg (-1 lb 3.2 oz) Exam:   General:  Pt is alert, follows commands appropriately, not in acute distress  HEENT: No icterus, No thrush, No neck mass, [place]/AT  Cardiovascular: RRR, S1/S2, no rubs, no gallops  Respiratory:  bibasilar crackles without wheezing. Good air movement.  Abdomen: Soft/+BS, non tender, non distended, no guarding  Extremities: No edema, No lymphangitis, No petechiae, No rashes, no synovitis   Data Reviewed: I have personally reviewed following labs and imaging  studies Basic Metabolic Panel:  Recent Labs Lab 09/03/15 0245  09/05/15 0445 09/06/15 0559 09/07/15 0500 09/07/15 1128 09/08/15 0630  NA 138  --  137 139  --  139 139  K 4.0  --  3.6 3.4*  --  3.4* 3.4*  CL 104  --  104 105  --  107 108  CO2 26  --  27 26  --  26 24  GLUCOSE 164*  --  209* 168*  --  195* 191*  BUN 10  --  14 12  --  15 14  CREATININE 1.26*  --  1.24 1.32*  --  1.22 1.17  CALCIUM 9.3  --  9.0 9.1  --  8.8* 8.7*  MG 2.1  < > 1.8 1.7 1.7 1.6* 1.7  < > = values in this interval not displayed. Liver Function Tests:  Recent Labs Lab 09/02/15 0644 09/06/15 0559  AST 38 35  ALT 51 58  ALKPHOS 348* 245*  BILITOT 0.7 0.9  PROT 6.2* 5.7*  ALBUMIN 2.8* 2.7*   No results for input(s): LIPASE, AMYLASE in the last 168 hours.  Recent Labs Lab 09/06/15 0559  AMMONIA 13   Coagulation Profile: No results for input(s): INR, PROTIME in the last 168 hours. CBC:  Recent Labs Lab 09/02/15 0644 09/03/15 0245 09/05/15 0445 09/07/15 1128 09/08/15 0630  WBC 8.1 9.2 7.7 8.6 9.1  HGB 11.4* 11.9* 10.8* 10.3* 10.1*  HCT 36.1* 37.4* 33.7* 32.0* 31.9*  MCV 93.0 94.0 91.3 92.0 94.7  PLT 268 270 273  255 237   Cardiac Enzymes: No results for input(s): CKTOTAL, CKMB, CKMBINDEX, TROPONINI in the last 168 hours. BNP: Invalid input(s): POCBNP CBG:  Recent Labs Lab 09/07/15 1129 09/07/15 1602 09/07/15 2119 09/08/15 0620 09/08/15 1150  GLUCAP 190* 184* 229* 178* 210*   HbA1C: No results for input(s): HGBA1C in the last 72 hours. Urine analysis:    Component Value Date/Time   COLORURINE YELLOW 09/05/2015 2048   APPEARANCEUR CLOUDY* 09/05/2015 2048   LABSPEC 1.015 09/05/2015 2048   PHURINE 7.0 09/05/2015 2048   GLUCOSEU NEGATIVE 09/05/2015 2048   HGBUR NEGATIVE 09/05/2015 2048   BILIRUBINUR NEGATIVE 09/05/2015 2048   BILIRUBINUR neg 09/09/2013 1233   KETONESUR NEGATIVE 09/05/2015 2048   PROTEINUR >300* 09/05/2015 2048   PROTEINUR >=300 09/09/2013 1233    UROBILINOGEN 0.2 09/09/2013 1233   NITRITE NEGATIVE 09/05/2015 2048   NITRITE neg 09/09/2013 1233   LEUKOCYTESUR TRACE* 09/05/2015 2048   Sepsis Labs: _0 (procalcitonin:4,lacticidven:4) ) Recent Results (from the past 240 hour(s))  Culture, blood (routine x 2)     Status: None   Collection Time: 08/30/15  1:25 PM  Result Value Ref Range Status   Specimen Description BLOOD RIGHT ANTECUBITAL  Final   Special Requests IN PEDIATRIC BOTTLE 2CC  Final   Culture NO GROWTH 5 DAYS  Final   Report Status 09/04/2015 FINAL  Final  Culture, blood (routine x 2)     Status: None   Collection Time: 08/30/15  1:30 PM  Result Value Ref Range Status   Specimen Description BLOOD RIGHT ANTECUBITAL  Final   Special Requests IN PEDIATRIC BOTTLE 3CC  Final   Culture NO GROWTH 5 DAYS  Final   Report Status 09/04/2015 FINAL  Final  Body fluid culture     Status: None   Collection Time: 08/30/15  5:17 PM  Result Value Ref Range Status   Specimen Description FLUID  Final   Special Requests INTERVERTEBRAL DISC  Final   Gram Stain   Final    RARE WBC PRESENT,BOTH PMN AND MONONUCLEAR NO ORGANISMS SEEN    Culture No growth aerobically or anaerobically.  Final   Report Status 09/04/2015 FINAL  Final  Urine culture     Status: Abnormal   Collection Time: 09/05/15  8:48 PM  Result Value Ref Range Status   Specimen Description URINE, RANDOM  Final   Special Requests NONE  Final   Culture >=100,000 COLONIES/mL YEAST (A)  Final   Report Status 09/07/2015 FINAL  Final  Aerobic/Anaerobic Culture (surgical/deep wound)     Status: None (Preliminary result)   Collection Time: 09/06/15  7:11 PM  Result Value Ref Range Status   Specimen Description TISSUE  Final   Special Requests LUMBAR PARASPINOUS SAMPLE A  Final   Gram Stain   Final    FEW WBC PRESENT,BOTH PMN AND MONONUCLEAR NO ORGANISMS SEEN    Culture NO GROWTH 1 DAY  Final   Report Status PENDING  Incomplete  Aerobic/Anaerobic Culture  (surgical/deep wound)     Status: None (Preliminary result)   Collection Time: 09/06/15  7:39 PM  Result Value Ref Range Status   Specimen Description TISSUE  Final   Special Requests LUMBAR DISK SAMPLE B  Final   Gram Stain   Final    RARE WBC PRESENT, PREDOMINANTLY PMN NO ORGANISMS SEEN    Culture NO GROWTH 1 DAY  Final   Report Status PENDING  Incomplete     Scheduled Meds: . allopurinol  300 mg Oral Q lunch  .  amLODipine  10 mg Oral Q supper  . antiseptic oral rinse  7 mL Mouth Rinse BID  . aspirin EC  81 mg Oral Q lunch  . atorvastatin  20 mg Oral q1800  . calcitRIOL  0.25 mcg Oral Q M,W,F  . cefTRIAXone (ROCEPHIN)  IV  2 g Intravenous Q24H  . cycloSPORINE  1 drop Both Eyes BID  . feeding supplement (GLUCERNA SHAKE)  237 mL Oral TID BM  . finasteride  5 mg Oral QHS  . insulin aspart  0-20 Units Subcutaneous TID WC  . insulin aspart  0-5 Units Subcutaneous QHS  . insulin glargine  6 Units Subcutaneous QHS  . metoprolol  100 mg Oral BID  . mometasone-formoterol  2 puff Inhalation BID  . montelukast  10 mg Oral QHS  . pilocarpine  1 drop Both Eyes TID  . polyethylene glycol  17 g Oral BID  . potassium chloride  10 mEq Intravenous Once  . predniSONE  7 mg Oral Q breakfast  . senna-docusate  2 tablet Oral BID  . sodium chloride flush  10-40 mL Intracatheter Q12H  . tamsulosin  0.4 mg Oral QHS  . traZODone  100 mg Oral QHS  . vancomycin  500 mg Intravenous Q12H   Continuous Infusions: . sodium chloride 50 mL/hr at 09/07/15 2249    Procedures/Studies: Mr Brain Wo Contrast  08/29/2015  CLINICAL DATA:  Altered mental status. Patient threatening himself and others. Lethargy. Evaluation for stroke. EXAM: MRI HEAD WITHOUT CONTRAST TECHNIQUE: Multiplanar, multiecho pulse sequences of the brain and surrounding structures were obtained without intravenous contrast. COMPARISON:  08/03/2015 head CT FINDINGS: The study is mildly motion degraded. There is no evidence of acute infarct,  intracranial hemorrhage, mass, midline shift, or extra-axial fluid collection. Cerebral atrophy is within normal limits for age. Patchy cerebral white matter T2 hyperintensities are nonspecific but compatible with mild-to-moderate chronic small vessel ischemic disease. Prior bilateral cataract extraction is noted. Paranasal sinuses and mastoid air cells are clear. Major intracranial vascular flow voids are preserved. IMPRESSION: 1. No acute intracranial abnormality. 2. Mild-to-moderate chronic small vessel ischemic disease. Electronically Signed   By: Logan Bores M.D.   On: 08/29/2015 13:03   Mr Lumbar Spine W Wo Contrast  08/30/2015  ADDENDUM REPORT: 08/30/2015 09:27 ADDENDUM: Study discussed by telephone with Imelda Pillow, Medical Assitant for Dr. Ashok Pall on 08/30/2015 at 0912 hours. Electronically Signed   By: Genevie Ann M.D.   On: 08/30/2015 09:27  08/30/2015  CLINICAL DATA:  75 year old male with lumbar back pain radiating to the left lower extremity status post lumbar spine surgery 07/30/2015. Subsequent encounter. EXAM: MRI LUMBAR SPINE WITHOUT AND WITH CONTRAST TECHNIQUE: Multiplanar and multiecho pulse sequences of the lumbar spine were obtained without and with intravenous contrast. CONTRAST:  41m MULTIHANCE GADOBENATE DIMEGLUMINE 529 MG/ML IV SOLN COMPARISON:  Outside preoperative lumbar MRI 07/24/2015. Intraoperative radiographs 07/30/2015. FINDINGS: Segmentation:  Normal as seen on the intraoperative radiographs. Alignment: Interval mild L4 superior endplate deformity with mild (5-10%) loss of L4 vertebral body height. Associated mild endplate edema there are and at the adjacent L3 inferior endplate. Postoperative changes at this level further described below. Stable vertebral height and alignment elsewhere. Vertebrae: Endplate marrow edema at L3 and L4 as stated. Stable and normal bone marrow signal elsewhere. Conus medullaris: Extends to the L1-L2 level and appears normal. No lower thoracic  spinal cord signal abnormality. Paraspinal and other soft tissues: Postoperative changes in the posterior paraspinal soft tissues from the L2 spinous  process level to the L4 spinous process level. Medial bilateral erector spinae muscle edema and enhancement. Enhancement tracks from the skin surface to the epidural space at L3-L4. There is a a small 15 mm fluid collection overlying the L3 spinous process. See also L3-L4 disc space findings below. No definite prevertebral soft tissue inflammation. There is mild right greater than left medial psoas muscle inflammation and enhancement (series 6, image 1). Disc levels: The disc space levels L1-L2 and above are stable. The L4-L5 and L5-S1 levels are stable. L2-L3:  Stable. L3-L4: Large cephalad disc extrusion with disc fragment encompassing 7 x 11 x 24 mm (AP by transverse by CC). See series 9, image 12 and series 4, image 8. The L3-L4 disc space now appears to be fluid-filled. The endplates are mildly edematous and enhancing. Moderate facet and ligament flavum hypertrophy Re demonstrated. Postoperative changes possibly to the ligament flavum. Subsequent severe spinal stenosis (series 9, image 14). IMPRESSION: 1. Postoperative changes at the L3-L4 level with superimposed large cephalad disc extrusion (series 4, image 8) causing severe spinal stenosis, as well as abnormal fluid signal in the L3-L4 disc space associated with mild L4 superior endplate fracture with L3 and L4 endplate edema/inflammation. Mild associated psoas muscle inflammation. 2. The appearance is such that discitis osteomyelitis at L3-L4 cannot be excluded. 3. There is also a small postoperative fluid collection overlying the L3 spinous process, favor seroma. Electronically Signed: By: Genevie Ann M.D. On: 08/29/2015 13:39   Dg Lumbar Spine 1 View  09/06/2015  CLINICAL DATA:  LUMBAR LAMINECTOMY/DECOMPRESSION MICRODISCECTOMY 3/4 EXAM: LUMBAR SPINE - 1 VIEW COMPARISON:  MR lumbar spine 08/29/2015 FINDINGS:  Single lateral intraoperative view of lumbar spine. Using the numbering convention of comparison MRI, there is a current tip probe posterior to the L3 vertebral body. IMPRESSION: Intraoperative view as appear Electronically Signed   By: Suzy Bouchard M.D.   On: 09/06/2015 21:06   Ir Fluoro Guide Ndl Plmt / Bx  09/04/2015  INDICATION: Lumbar discitis. EXAM: IR FLUORO GUIDE NEEDLE PLACEMENT /BIOPSY AT L3-L4 MEDICATIONS: Versed 2 mg IV.  Fentanyl 50 mcg IV. ANESTHESIA/SEDATION: Moderate (conscious) sedation was employed during this procedure. A total of Versed 2 mg and Fentanyl 50 mcg was administered intravenously. Moderate Sedation Time: 13 minutes. The patient's level of consciousness and vital signs were monitored continuously by radiology nursing throughout the procedure under my direct supervision. FLUOROSCOPY TIME:  Fluoroscopy Time: 2 minutes 36 seconds (207 mGy). COMPLICATIONS: None immediate. PROCEDURE: Informed written consent was obtained from the patient after a thorough discussion of the procedural risks, benefits and alternatives. All questions were addressed. Maximal Sterile Barrier Technique was utilized including caps, mask, sterile gowns, sterile gloves, sterile drape, hand hygiene and skin antiseptic. A timeout was performed prior to the initiation of the procedure. The patient was laid prone on the fluoroscopic table. The skin overlying the lumbar region was then prepped and draped in the usual sterile fashion. Skin entry site at the L3-L4 paraspinous region was then infiltrated with 0.25% bupivacaine and carried into the paraspinous muscles. Thereafter, using biplane intermittent fluoroscopy, a 21 gauge Francine biopsy needle was then advanced into the L3-L4 disc space without difficulty. Using a 20 mL syringe, a fleck of bloody aspirate was obtained with two passes of two separate 21 gauge Francine needles. Approximately 1.5 cc of aspirate was obtained in total. Hemostasis was achieved at  the skin entry site. The specimens were then sent for microbiology testing as per request. The patient tolerated the  procedure well with no acute complications. IMPRESSION: Status post fluoroscopic guided needle placement with aspiration of the L3-L4 disc space retrieving a total of 1.5 cc of thick bloody aspirate. Electronically Signed   By: Luanne Bras M.D.   On: 08/30/2015 15:23    Kyren Knick, DO  [facility] Pager [phone]  If 7PM-7AM, please contact night-coverage [email or web address] Password [facility] 09/08/2015, 4:43 PM   LOS: 11 days

## 2015-09-08 NOTE — Progress Notes (Signed)
CSW contacted patient's daughter Hilda Blades to inform of bed offers. Daughter reports she would like to visit the facilities before making a decision. CSW informed daughter that weekday CSW will follow up regarding bed selection. Daughter was very appreciative of the services provided by CSW. No further concerns to report at this time.   CSW will continue to follow.   Lucius Conn, [place] Worker [facility] Ph: [phone]

## 2015-09-08 NOTE — Progress Notes (Signed)
Overall stable. Confusion improving. Pain well controlled. Denies lower extremity pain.  Afebrile. Vitals are stable.  Progressing well following microdiscectomy. Mobilize.

## 2015-09-08 NOTE — Progress Notes (Signed)
Physical Therapy Treatment Patient Details Name: Jacob [name] MRN: [name] DOB: [birth year] Today's Date: 09/08/2015    History of Present Illness AVEN ESSA is a 75 y.o. male with medical history significant of lumbar stenosis, COPD, rheumatoid arthritis, hypertension, sleep apnea, CAD, DM 2, neuropathy, PMR, admitted for acute encephalopathy. Pt had a L3/4 laminiectomy discectomy done in May and was d/c'd to [facility] and was then d/c'd home on 6/8. He then progressively declined both physically and cognitively. Pt then underwent a redo L3/4 discectomy on 6/23.    PT Comments    PT asked to come assist pt OOB due to pt very restless in the bed and yelling out in pain. Pt did transfer to chair with maxAx2 but con't to be in extreme pain and confused. Cont' to recommend SNF upon d/c.  Follow Up Recommendations  SNF;Supervision/Assistance - 24 hour     Equipment Recommendations  None recommended by PT    Recommendations for Other Services       Precautions / Restrictions Precautions Precautions: Fall;Back Precaution Booklet Issued: No Restrictions Weight Bearing Restrictions: No    Mobility  Bed Mobility Overal bed mobility: Needs Assistance Bed Mobility: Rolling;Sidelying to Sit Rolling: Max assist Sidelying to sit: Max assist       General bed mobility comments: pt requires max directional verbal and tactile cues, maxA for rolling, maxA for trunk elevation and LE management off EOB  Transfers Overall transfer level: Needs assistance Equipment used: Rolling walker (2 wheeled) Transfers: Sit to/from [facility] Sit to Stand: Max assist;+2 physical assistance Stand pivot transfers: Max assist;+2 physical assistance       General transfer comment: max directional v/c's and assist to advance LEs to step towards chair. Pt with posterior lean and increased fear of falling, v/c's for relaxation  Ambulation/Gait                  Stairs            Wheelchair Mobility    Modified Rankin (Stroke Patients Only)       Balance Overall balance assessment: Needs assistance Sitting-balance support: Feet supported;Bilateral upper extremity supported Sitting balance-Leahy Scale: Poor     Standing balance support: Bilateral upper extremity supported Standing balance-Leahy Scale: Poor                      Cognition Arousal/Alertness: Awake/alert (but sleepy) Behavior During Therapy: Restless;Anxious Overall Cognitive Status: Impaired/Different from baseline                      Exercises      General Comments        Pertinent Vitals/Pain Pain Assessment: Faces Faces Pain Scale: Hurts whole lot Pain Location: back Pain Descriptors / Indicators: Grimacing (yelling) Pain Intervention(s):  (RN gave pt pain meds since initial session)    Home Living                      Prior Function            PT Goals (current goals can now be found in the care plan section) Acute Rehab PT Goals Patient Stated Goal: get out of bed Progress towards PT goals: Progressing toward goals    Frequency  Min 5X/week    PT Plan Current plan remains appropriate    Co-evaluation             End of Session Equipment Utilized  During Treatment: Gait belt Activity Tolerance: Patient limited by pain Patient left: in chair;with call bell/phone within reach;with chair alarm set     Time: [id number] PT Time Calculation (min) (ACUTE ONLY): 12 min  Charges:  $Therapeutic Activity: 8-22 mins                    G Codes:      Kingsley Callander 09/08/2015, 11:26 AM   Kittie Plater, PT, DPT Pager #: [phone] Office #: [phone]

## 2015-09-08 NOTE — Discharge Summary (Signed)
Physician Discharge Summary  Jacob [name] GGY:[id number] DOB: January 11, 1941 DOA: 08/28/2015  PCP: [name], MD  Admit date: 08/28/2015 Discharge date: 09/09/15  Admitted From: Home Disposition:  SNF  Recommendations for Outpatient Follow-up:  1. Follow up with PCP in 1-2 weeks 2. Please obtain BMP/CBC once weekly x 3 weeks starting 09/10/15 3. Please check Vancomycin trough on 09/10/15 and adjust vancomycin dose accordingly per your PharmD protocol 4. Continue vancomycin and ceftriaxone through 09/26/15 5. Discontinue PICC line after last dose of antibiotics on 09/26/15 6. Check CRP/ESR every 7 days starting 09/11/15 and last draw on 09/26/15 7. Please monitor CBGs q ac/hs and adjust insulin regimen accordingly  Home Health: NO Equipment/Devices: PICC line placed 08/31/15  Discharge Condition:Stable CODE STATUS:FULL Diet recommendation: Heart Healthy / Carb Modified  Brief/Interim Summary: 75 y.o. WM PMHx Anxiety, Lumbar stenosis S/P laminectomy and Discectomy Jul 30, 2015 by Dr Ashok Pall., COPD, RA, HTN, OSA, CAD native artery S/P CABG 7 vessel, DM Type 2 uncontrolled with complication, DM neuropathy, PMR, CKD, Cancer of kidney, Hepatitis  Presented with worsening encephalopathy for 24 hours He was discharged to home on June 8 from SNF. On 10th of June he started to get progressively worse. He was seen in office by Dr. Christella Noa and was doing better. But he started to threaten family he had psych eval at [facility] and was told to go to PCP. He went to ER at [facility] care. He was seen in [place] ED. patient had extensive workup chest x-ray was unremarkable UA was concentrated but no evidence of UTI. CT scan of the head was non-acute. U tox positive for benzos and opioids. Troponin was slightly elevated but no EKG changes suggestive of ischemia. MRI was planned. The patient was given an anxiolytic agent. Unfortunately, the patient became lethargic after the anxiolytic agent.MRI of the  lumbar spine showed postoperative changes at L3-4 with superimposed cephalad disc extrusion causing severe spinal stenosis as well as abnormal fluid signal and L3-4 disc space. There was concern that this may represent early discitis/osteomyelitis. He was noted to have elevated LFTs ultrasound was done showing no evidence of cholecystitis. Patient was accepted in transfer to stepdown to [facility].  The patient was subsequently transferred to neurotelemetry.  ID was consulted and recommended starting pt on vanco and ceftriaxone x 42 days.  PICC line was placed on 08/31/15.  Neurosurgery continued to follow the patient.  Dr. Christella Noa took patient for redo L3/4 disectomy on 09/06/15.    He did well postoperatively.  His mental status continued to wax and wane.  Workup for reversible causes of delirium was unremarkable.  It is suspected pt likely had a degree of underlying cognitive impairment with recent stressors causing decompensation.   Regarding pertinent Chronic problems: In May patient had a fall in the bathtub MRI of the back show large herniated nucleus pulposus at L3-4 with severe lumbar stenosis. He has undergone lumbar laminectomy discectomy at the level L3/4 by Dr. Barrie Folk op course was complicated by pneumonia, acute renal failure and elevate troponins. He was d/ced to [facility] on 08/09/15. Blood pressure at baseline controlled with amlodipine and Lopressor While in the nursing home he finished Augmentin for his age Pneumonia She has history of polymyalgia rheumatica for which she takes prednisone 7 mg daily Last echogram done in May 2017 showing preserved EF no evidence of diastolic dysfunction  Discharge Diagnoses:  Acute encephalopathy -Multifactorial including infection, polypharmacy, sundowning and day/night reversion in the setting of dehydration -Patient continues  to have waxing and waning symptoms but overally he is less confused and less agitated -He is communicative and  usually A&O x 2 -Discontinue Megace -TSH 1.709 -Serum B12 941 -Ammonia 13 -Urinalysis no pyuria -08/29/2015 MR brain negative for acute findings -08/03/2015 RPR negative  Depression with aggressive behavior -Previously threatening violence against family and self -Per family very depressed about current condition especially with wife also being ill. -09/01/2015--seen by psychiatry--did not feel patient needed inpatient psychiatric admission--started on trazodone 100 mg daily at bedtime  Herniated nucleus pulposus with spinal stenosis/early discitis -L3-4 discectomy 07/30/2015 and 08/27/15 -08/29/2015 MRI lumbar spine--postoperative change versus early discitis -Family initially did not want surgical intervention -08/30/2015 INR aspiration--culture negative -ID consulted--> seen by Dr. Baxter Flattery -09/09/15--case discussed with Dr. Liam Rogers recommended 28 days of IV abx with vancomycin and ceftriaxone and f/u in office in 3 weeks -09/09/15--case discussed with Dr. Peyton Najjar for d/c from neurosurg standpoint -PICC line placed 08/31/15 and started on ceftriaxone and vancomycin D#11 of 28 -recheck ESR 90-->40  -recheck CRP 17.1-->0.7 -09/06/15--L3/4 disectomy redo--Dr. Cabbell--culture negative -last day of vanco and ceftriaxone is 09/26/15  Coronary artery disease native artery -No anginal symptoms -Continue metoprolol tartrate 100 mg twice a day -Personally reviewed EKG--no concerning ischemic changes -Continue statin -Continue aspirin  Chronic diastolic CHF -Daily weights -08/29/2015 echo EF 55-60%, grade 1 DD -Clinically compensated -Neg 2.6 L since admission  Polymyalgia rheumatica -Continue daily prednisone 7 mg  Renal cell carcinoma -Status post ablation at [facility] -stable  Diabetes mellitus type 2 with hyperglycemia -08/29/15- hemoglobin A1c= 6.8 -Increase Lantus 10 units daily during the hospitalization -Resistant SSI -due to decreased po intake, pt will d/c on  decreased dose of 70/30 insulin--20 units bid  Elevated LFTs -outside hospital. Obtained ultrasound that showed no evidence of gallstone disease or cholecystitis possible dehydration related  Hypokalemia -repleted  Hypomagnesemia -repleted  Pain management  -Methocarbamol 500 mg TID -Morphine PRN   Discharge Instructions      Discharge Instructions    Diet - low sodium heart healthy    Complete by:  As directed      Increase activity slowly    Complete by:  As directed             Medication List    STOP taking these medications        cyclobenzaprine 10 MG tablet  Commonly known as:  FLEXERIL     gabapentin 300 MG capsule  Commonly known as:  NEURONTIN     insulin aspart protamine- aspart (70-30) 100 UNIT/ML injection  Commonly known as:  NOVOLOG MIX 70/30  Replaced by:  insulin NPH-regular Human (70-30) 100 UNIT/ML injection     potassium chloride SA 20 MEQ tablet  Commonly known as:  K-DUR,KLOR-CON     simvastatin 40 MG tablet  Commonly known as:  ZOCOR     tiZANidine 4 MG capsule  Commonly known as:  ZANAFLEX      TAKE these medications        acetaminophen 325 MG tablet  Commonly known as:  TYLENOL  Take 2 tablets (650 mg total) by mouth every 6 (six) hours as needed for mild pain (or Fever >/= 101).     acidophilus Caps capsule  Take 2 capsules by mouth daily.     albuterol 108 (90 Base) MCG/ACT inhaler  Commonly known as:  PROVENTIL HFA;VENTOLIN HFA  Inhale 2 puffs into the lungs every 6 (six) hours as needed for wheezing or shortness of breath.  allopurinol 300 MG tablet  Commonly known as:  ZYLOPRIM  Take 300 mg by mouth daily with lunch.     amLODipine 10 MG tablet  Commonly known as:  NORVASC  Take 10 mg by mouth daily with supper.     aspirin EC 81 MG tablet  Take 81 mg by mouth daily with lunch.     atorvastatin 20 MG tablet  Commonly known as:  LIPITOR  Take 1 tablet (20 mg total) by mouth daily at 6 PM.      budesonide-formoterol 160-4.5 MCG/ACT inhaler  Commonly known as:  SYMBICORT  Inhale 2 puffs into the lungs 2 (two) times daily.     calcitRIOL 0.25 MCG capsule  Commonly known as:  ROCALTROL  Take 0.25 mcg by mouth every Monday, Wednesday, and Friday.     cefTRIAXone 2 g in dextrose 5 % 50 mL  Inject 2 g into the vein daily. Last day antibiotics on 09/26/15     cycloSPORINE 0.05 % ophthalmic emulsion  Commonly known as:  RESTASIS  Place 1 drop into both eyes 2 (two) times daily.     feeding supplement (GLUCERNA SHAKE) Liqd  Take 237 mLs by mouth 3 (three) times daily between meals.     finasteride 5 MG tablet  Commonly known as:  PROSCAR  Take 5 mg by mouth at bedtime.     insulin NPH-regular Human (70-30) 100 UNIT/ML injection  Commonly known as:  NOVOLIN 70/30  Inject 20 Units into the skin 2 (two) times daily with a meal.     ipratropium-albuterol 0.5-2.5 (3) MG/3ML Soln  Commonly known as:  DUONEB  Take 3 mLs by nebulization every 8 (eight) hours. Reported on 03/27/2015     metoprolol 100 MG tablet  Commonly known as:  LOPRESSOR  Take 1 tablet (100 mg total) by mouth 2 (two) times daily.     montelukast 10 MG tablet  Commonly known as:  SINGULAIR  Take 1 tablet (10 mg total) by mouth at bedtime.     oxyCODONE 5 MG immediate release tablet  Commonly known as:  Oxy IR/ROXICODONE  Take 1 tablet (5 mg total) by mouth every 4 (four) hours as needed for severe pain.     pilocarpine 2 % ophthalmic solution  Commonly known as:  PILOCAR  Place 1 drop into both eyes 3 (three) times daily.     polyethylene glycol packet  Commonly known as:  MIRALAX / GLYCOLAX  Take 17 g by mouth 2 (two) times daily.     predniSONE 5 MG tablet  Commonly known as:  DELTASONE  Take 5 mg by mouth daily with breakfast. IN CONJUNCTION WITH TWO 1 MG TABLETS TO EQUAL A TOTAL OF 7 MILLIGRAMS     predniSONE 1 MG tablet  Commonly known as:  DELTASONE  Take 2 mg by mouth daily with breakfast. IN  CONJUNCTION WITH ONE 5 MG TABLET TO EQUAL A TOTAL OF 7 MILLIGRAMS     PRESERVISION AREDS 2 PO  Take 1 tablet by mouth 2 (two) times daily.     sennosides-docusate sodium 8.6-50 MG tablet  Commonly known as:  SENOKOT-S  Take 2 tablets by mouth 2 (two) times daily.     SIMBRINZA 1-0.2 % Susp  Generic drug:  Brinzolamide-Brimonidine  Place 1 drop into both eyes 2 (two) times daily.     sodium chloride 0.9 % nebulizer solution  Take 3 mLs by nebulization every 6 (six) hours as needed for wheezing.  tamsulosin 0.4 MG Caps capsule  Commonly known as:  FLOMAX  Take 0.4 mg by mouth at bedtime.     traMADol 50 MG tablet  Commonly known as:  ULTRAM  Take 1 tablet (50 mg total) by mouth every 6 (six) hours as needed for moderate pain.     traZODone 100 MG tablet  Commonly known as:  DESYREL  Take 1 tablet (100 mg total) by mouth at bedtime.     vancomycin 500 mg in sodium chloride 0.9 % 100 mL  Inject 500 mg into the vein every 12 (twelve) hours. Last day antibiotic on 09/26/15     VICTOZA 18 MG/3ML Sopn  Generic drug:  Liraglutide  Inject 1.2 mg into the skin daily at 3 pm.       Follow-up Information    Follow up with Carlyle Basques, MD On 10/14/2015.   Specialty:  Infectious Diseases   Why:  2:30 PM   Contact information:   [address] [place] [address] [phone]       Follow up with CABBELL,KYLE L, MD In 2 weeks.   Specialty:  Neurosurgery   Contact information:   [address] [place] [address] [phone]       Follow up with [name], MD In 2 weeks.   Specialty:  Family Medicine   Contact information:   [address] [place] [address] [phone]      Allergies  Allergen Reactions  . Ace Inhibitors Other (See Comments)    Probably nausea and vomiting per patient   . Actonel [Risedronate] Nausea And Vomiting  . Ciprocinonide [Fluocinolone] Other (See Comments)    Probably nausea and vomiting  per patient  . Flunisolide Other (See Comments)    Probably nausea and vomiting per patient   . Metformin And Related Other (See Comments)    Probably nausea and vomiting per patient   . Sertraline Other (See Comments)    Probably nausea and vomiting per patient   . Sulindac Other (See Comments)    Probably nausea and vomiting per patient   . Terazosin Other (See Comments)    Probably nausea and vomiting per patient     Consultations:  Neurosurgery-Cabbell  ID--Snider  Psychiatry-Akintayo   Procedures/Studies: Mr Brain Wo Contrast  08/29/2015  CLINICAL DATA:  Altered mental status. Patient threatening himself and others. Lethargy. Evaluation for stroke. EXAM: MRI HEAD WITHOUT CONTRAST TECHNIQUE: Multiplanar, multiecho pulse sequences of the brain and surrounding structures were obtained without intravenous contrast. COMPARISON:  08/03/2015 head CT FINDINGS: The study is mildly motion degraded. There is no evidence of acute infarct, intracranial hemorrhage, mass, midline shift, or extra-axial fluid collection. Cerebral atrophy is within normal limits for age. Patchy cerebral white matter T2 hyperintensities are nonspecific but compatible with mild-to-moderate chronic small vessel ischemic disease. Prior bilateral cataract extraction is noted. Paranasal sinuses and mastoid air cells are clear. Major intracranial vascular flow voids are preserved. IMPRESSION: 1. No acute intracranial abnormality. 2. Mild-to-moderate chronic small vessel ischemic disease. Electronically Signed   By: Logan Bores M.D.   On: 08/29/2015 13:03   Mr Lumbar Spine W Wo Contrast  08/30/2015  ADDENDUM REPORT: 08/30/2015 09:27 ADDENDUM: Study discussed by telephone with Imelda Pillow, Medical Assitant for Dr. Ashok Pall on 08/30/2015 at 0912 hours. Electronically Signed   By: Genevie Ann M.D.   On: 08/30/2015 09:27  08/30/2015  CLINICAL DATA:  75 year old male with lumbar back pain radiating to the left lower  extremity status post lumbar spine surgery 07/30/2015. Subsequent encounter. EXAM: MRI LUMBAR SPINE WITHOUT AND WITH CONTRAST TECHNIQUE: Multiplanar and multiecho pulse sequences of the lumbar spine were obtained without and with intravenous contrast. CONTRAST:  85m MULTIHANCE GADOBENATE DIMEGLUMINE 529 MG/ML IV SOLN COMPARISON:  Outside preoperative lumbar MRI 07/24/2015. Intraoperative radiographs 07/30/2015. FINDINGS: Segmentation:  Normal as seen on the intraoperative radiographs. Alignment: Interval mild L4 superior endplate deformity with mild (5-10%) loss of L4 vertebral body height. Associated mild endplate edema there are and at the adjacent L3 inferior endplate. Postoperative changes at this level further described below. Stable vertebral height and alignment elsewhere. Vertebrae: Endplate marrow edema at L3 and L4 as stated. Stable and normal bone marrow signal elsewhere. Conus medullaris: Extends to the L1-L2 level and appears normal. No lower thoracic spinal cord signal abnormality. Paraspinal and other soft tissues: Postoperative changes in the posterior paraspinal soft tissues from the L2 spinous process level to the L4 spinous process level. Medial bilateral erector spinae muscle edema and enhancement. Enhancement tracks from the skin surface to the epidural space at L3-L4. There is a a small 15 mm fluid collection overlying the L3 spinous process. See also L3-L4 disc space findings below. No definite prevertebral soft tissue inflammation. There is mild right greater than left medial psoas muscle inflammation and enhancement (series 6, image 1). Disc levels: The disc space levels L1-L2 and above are stable. The L4-L5 and L5-S1 levels are stable. L2-L3:  Stable. L3-L4: Large cephalad disc extrusion with disc fragment encompassing 7 x 11 x 24 mm (AP by transverse by CC). See series 9, image 12 and series 4, image 8. The L3-L4 disc space now appears to be fluid-filled. The endplates are mildly  edematous and enhancing. Moderate facet and ligament flavum hypertrophy Re demonstrated. Postoperative changes possibly to the ligament flavum. Subsequent severe spinal stenosis (series 9, image 14). IMPRESSION: 1. Postoperative changes at the L3-L4 level with superimposed large cephalad disc extrusion (series 4, image 8) causing severe spinal stenosis, as well as abnormal fluid signal in the L3-L4 disc space associated with mild L4 superior endplate fracture with L3 and L4 endplate edema/inflammation. Mild associated psoas muscle inflammation. 2. The appearance is such that discitis osteomyelitis at L3-L4 cannot be excluded. 3. There is also a small postoperative fluid collection overlying the L3 spinous process, favor seroma. Electronically Signed: By: HGenevie AnnM.D. On: 08/29/2015 13:39   Dg Lumbar Spine 1 View  09/06/2015  CLINICAL DATA:  LUMBAR LAMINECTOMY/DECOMPRESSION MICRODISCECTOMY 3/4 EXAM: LUMBAR SPINE - 1 VIEW COMPARISON:  MR lumbar spine 08/29/2015 FINDINGS: Single lateral intraoperative view of lumbar spine. Using the numbering convention of comparison MRI, there is a current tip probe posterior to the L3 vertebral body. IMPRESSION: Intraoperative view as appear Electronically Signed   By: SSuzy BouchardM.D.   On: 09/06/2015 21:06   Ir Fluoro Guide Ndl Plmt / Bx  09/04/2015  INDICATION: Lumbar discitis. EXAM: IR FLUORO GUIDE NEEDLE PLACEMENT /BIOPSY AT L3-L4 MEDICATIONS: Versed 2 mg IV.  Fentanyl 50 mcg IV. ANESTHESIA/SEDATION: Moderate (conscious) sedation was employed during this procedure. A total of Versed 2 mg and Fentanyl 50 mcg was administered intravenously. Moderate Sedation Time: 13 minutes. The patient's level of consciousness and vital signs were monitored continuously by radiology nursing throughout the procedure under my direct supervision. FLUOROSCOPY TIME:  Fluoroscopy Time: 2 minutes 36 seconds (207 mGy). COMPLICATIONS: None immediate. PROCEDURE: Informed written consent was  obtained from the patient after a thorough discussion of the procedural risks,  benefits and alternatives. All questions were addressed. Maximal Sterile Barrier Technique was utilized including caps, mask, sterile gowns, sterile gloves, sterile drape, hand hygiene and skin antiseptic. A timeout was performed prior to the initiation of the procedure. The patient was laid prone on the fluoroscopic table. The skin overlying the lumbar region was then prepped and draped in the usual sterile fashion. Skin entry site at the L3-L4 paraspinous region was then infiltrated with 0.25% bupivacaine and carried into the paraspinous muscles. Thereafter, using biplane intermittent fluoroscopy, a 21 gauge Francine biopsy needle was then advanced into the L3-L4 disc space without difficulty. Using a 20 mL syringe, a fleck of bloody aspirate was obtained with two passes of two separate 21 gauge Francine needles. Approximately 1.5 cc of aspirate was obtained in total. Hemostasis was achieved at the skin entry site. The specimens were then sent for microbiology testing as per request. The patient tolerated the procedure well with no acute complications. IMPRESSION: Status post fluoroscopic guided needle placement with aspiration of the L3-L4 disc space retrieving a total of 1.5 cc of thick bloody aspirate. Electronically Signed   By: Luanne Bras M.D.   On: 08/30/2015 15:23        Discharge Exam: Filed Vitals:   09/09/15 0513 09/09/15 1114  BP: 131/82 164/71  Pulse: 89 92  Temp: 98.7 F (37.1 C) 98.5 F (36.9 C)  Resp: 18 19   Filed Vitals:   09/09/15 0413 09/09/15 0513 09/09/15 0756 09/09/15 1114  BP:  131/82  164/71  Pulse:  89  92  Temp:  98.7 F (37.1 C)  98.5 F (36.9 C)  TempSrc:  Oral  Oral  Resp:  18  19  Height:      Weight: 99.837 kg (220 lb 1.6 oz)     SpO2:  100% 98% 97%    General: Pt is alert, awake, not in acute distress Cardiovascular: RRR, S1/S2 +, no rubs, no gallops Respiratory:  CTA bilaterally, no wheezing, no rhonchi Abdominal: Soft, NT, ND, bowel sounds + Extremities: no edema, no cyanosis   The results of significant diagnostics from this hospitalization (including imaging, microbiology, ancillary and laboratory) are listed below for reference.    Significant Diagnostic Studies: Mr Brain Wo Contrast  08/29/2015  CLINICAL DATA:  Altered mental status. Patient threatening himself and others. Lethargy. Evaluation for stroke. EXAM: MRI HEAD WITHOUT CONTRAST TECHNIQUE: Multiplanar, multiecho pulse sequences of the brain and surrounding structures were obtained without intravenous contrast. COMPARISON:  08/03/2015 head CT FINDINGS: The study is mildly motion degraded. There is no evidence of acute infarct, intracranial hemorrhage, mass, midline shift, or extra-axial fluid collection. Cerebral atrophy is within normal limits for age. Patchy cerebral white matter T2 hyperintensities are nonspecific but compatible with mild-to-moderate chronic small vessel ischemic disease. Prior bilateral cataract extraction is noted. Paranasal sinuses and mastoid air cells are clear. Major intracranial vascular flow voids are preserved. IMPRESSION: 1. No acute intracranial abnormality. 2. Mild-to-moderate chronic small vessel ischemic disease. Electronically Signed   By: Logan Bores M.D.   On: 08/29/2015 13:03   Mr Lumbar Spine W Wo Contrast  08/30/2015  ADDENDUM REPORT: 08/30/2015 09:27 ADDENDUM: Study discussed by telephone with Imelda Pillow, Medical Assitant for Dr. Ashok Pall on 08/30/2015 at 0912 hours. Electronically Signed   By: Genevie Ann M.D.   On: 08/30/2015 09:27  08/30/2015  CLINICAL DATA:  75 year old male with lumbar back pain radiating to the left lower extremity status post lumbar spine surgery 07/30/2015. Subsequent encounter. EXAM:  MRI LUMBAR SPINE WITHOUT AND WITH CONTRAST TECHNIQUE: Multiplanar and multiecho pulse sequences of the lumbar spine were obtained without and with  intravenous contrast. CONTRAST:  92m MULTIHANCE GADOBENATE DIMEGLUMINE 529 MG/ML IV SOLN COMPARISON:  Outside preoperative lumbar MRI 07/24/2015. Intraoperative radiographs 07/30/2015. FINDINGS: Segmentation:  Normal as seen on the intraoperative radiographs. Alignment: Interval mild L4 superior endplate deformity with mild (5-10%) loss of L4 vertebral body height. Associated mild endplate edema there are and at the adjacent L3 inferior endplate. Postoperative changes at this level further described below. Stable vertebral height and alignment elsewhere. Vertebrae: Endplate marrow edema at L3 and L4 as stated. Stable and normal bone marrow signal elsewhere. Conus medullaris: Extends to the L1-L2 level and appears normal. No lower thoracic spinal cord signal abnormality. Paraspinal and other soft tissues: Postoperative changes in the posterior paraspinal soft tissues from the L2 spinous process level to the L4 spinous process level. Medial bilateral erector spinae muscle edema and enhancement. Enhancement tracks from the skin surface to the epidural space at L3-L4. There is a a small 15 mm fluid collection overlying the L3 spinous process. See also L3-L4 disc space findings below. No definite prevertebral soft tissue inflammation. There is mild right greater than left medial psoas muscle inflammation and enhancement (series 6, image 1). Disc levels: The disc space levels L1-L2 and above are stable. The L4-L5 and L5-S1 levels are stable. L2-L3:  Stable. L3-L4: Large cephalad disc extrusion with disc fragment encompassing 7 x 11 x 24 mm (AP by transverse by CC). See series 9, image 12 and series 4, image 8. The L3-L4 disc space now appears to be fluid-filled. The endplates are mildly edematous and enhancing. Moderate facet and ligament flavum hypertrophy Re demonstrated. Postoperative changes possibly to the ligament flavum. Subsequent severe spinal stenosis (series 9, image 14). IMPRESSION: 1. Postoperative changes  at the L3-L4 level with superimposed large cephalad disc extrusion (series 4, image 8) causing severe spinal stenosis, as well as abnormal fluid signal in the L3-L4 disc space associated with mild L4 superior endplate fracture with L3 and L4 endplate edema/inflammation. Mild associated psoas muscle inflammation. 2. The appearance is such that discitis osteomyelitis at L3-L4 cannot be excluded. 3. There is also a small postoperative fluid collection overlying the L3 spinous process, favor seroma. Electronically Signed: By: HGenevie AnnM.D. On: 08/29/2015 13:39   Dg Lumbar Spine 1 View  09/06/2015  CLINICAL DATA:  LUMBAR LAMINECTOMY/DECOMPRESSION MICRODISCECTOMY 3/4 EXAM: LUMBAR SPINE - 1 VIEW COMPARISON:  MR lumbar spine 08/29/2015 FINDINGS: Single lateral intraoperative view of lumbar spine. Using the numbering convention of comparison MRI, there is a current tip probe posterior to the L3 vertebral body. IMPRESSION: Intraoperative view as appear Electronically Signed   By: SSuzy BouchardM.D.   On: 09/06/2015 21:06   Ir Fluoro Guide Ndl Plmt / Bx  09/04/2015  INDICATION: Lumbar discitis. EXAM: IR FLUORO GUIDE NEEDLE PLACEMENT /BIOPSY AT L3-L4 MEDICATIONS: Versed 2 mg IV.  Fentanyl 50 mcg IV. ANESTHESIA/SEDATION: Moderate (conscious) sedation was employed during this procedure. A total of Versed 2 mg and Fentanyl 50 mcg was administered intravenously. Moderate Sedation Time: 13 minutes. The patient's level of consciousness and vital signs were monitored continuously by radiology nursing throughout the procedure under my direct supervision. FLUOROSCOPY TIME:  Fluoroscopy Time: 2 minutes 36 seconds (207 mGy). COMPLICATIONS: None immediate. PROCEDURE: Informed written consent was obtained from the patient after a thorough discussion of the procedural risks, benefits and alternatives. All questions were addressed. Maximal Sterile Barrier  Technique was utilized including caps, mask, sterile gowns, sterile gloves,  sterile drape, hand hygiene and skin antiseptic. A timeout was performed prior to the initiation of the procedure. The patient was laid prone on the fluoroscopic table. The skin overlying the lumbar region was then prepped and draped in the usual sterile fashion. Skin entry site at the L3-L4 paraspinous region was then infiltrated with 0.25% bupivacaine and carried into the paraspinous muscles. Thereafter, using biplane intermittent fluoroscopy, a 21 gauge Francine biopsy needle was then advanced into the L3-L4 disc space without difficulty. Using a 20 mL syringe, a fleck of bloody aspirate was obtained with two passes of two separate 21 gauge Francine needles. Approximately 1.5 cc of aspirate was obtained in total. Hemostasis was achieved at the skin entry site. The specimens were then sent for microbiology testing as per request. The patient tolerated the procedure well with no acute complications. IMPRESSION: Status post fluoroscopic guided needle placement with aspiration of the L3-L4 disc space retrieving a total of 1.5 cc of thick bloody aspirate. Electronically Signed   By: Luanne Bras M.D.   On: 08/30/2015 15:23     Microbiology: Recent Results (from the past 240 hour(s))  Culture, blood (routine x 2)     Status: None   Collection Time: 08/30/15  1:25 PM  Result Value Ref Range Status   Specimen Description BLOOD RIGHT ANTECUBITAL  Final   Special Requests IN PEDIATRIC BOTTLE 2CC  Final   Culture NO GROWTH 5 DAYS  Final   Report Status 09/04/2015 FINAL  Final  Culture, blood (routine x 2)     Status: None   Collection Time: 08/30/15  1:30 PM  Result Value Ref Range Status   Specimen Description BLOOD RIGHT ANTECUBITAL  Final   Special Requests IN PEDIATRIC BOTTLE 3CC  Final   Culture NO GROWTH 5 DAYS  Final   Report Status 09/04/2015 FINAL  Final  Body fluid culture     Status: None   Collection Time: 08/30/15  5:17 PM  Result Value Ref Range Status   Specimen Description FLUID   Final   Special Requests INTERVERTEBRAL DISC  Final   Gram Stain   Final    RARE WBC PRESENT,BOTH PMN AND MONONUCLEAR NO ORGANISMS SEEN    Culture No growth aerobically or anaerobically.  Final   Report Status 09/04/2015 FINAL  Final  Urine culture     Status: Abnormal   Collection Time: 09/05/15  8:48 PM  Result Value Ref Range Status   Specimen Description URINE, RANDOM  Final   Special Requests NONE  Final   Culture >=100,000 COLONIES/mL YEAST (A)  Final   Report Status 09/07/2015 FINAL  Final  Aerobic/Anaerobic Culture (surgical/deep wound)     Status: None (Preliminary result)   Collection Time: 09/06/15  7:11 PM  Result Value Ref Range Status   Specimen Description TISSUE  Final   Special Requests LUMBAR PARASPINOUS SAMPLE A  Final   Gram Stain   Final    FEW WBC PRESENT,BOTH PMN AND MONONUCLEAR NO ORGANISMS SEEN    Culture NO GROWTH 1 DAY  Final   Report Status PENDING  Incomplete  Aerobic/Anaerobic Culture (surgical/deep wound)     Status: None (Preliminary result)   Collection Time: 09/06/15  7:39 PM  Result Value Ref Range Status   Specimen Description TISSUE  Final   Special Requests LUMBAR DISK SAMPLE B  Final   Gram Stain   Final    RARE WBC PRESENT, PREDOMINANTLY  PMN NO ORGANISMS SEEN    Culture NO GROWTH 1 DAY  Final   Report Status PENDING  Incomplete     Labs: Basic Metabolic Panel:  Recent Labs Lab 09/03/15 0245  09/05/15 0445 09/06/15 0559 09/07/15 0500 09/07/15 1128 09/08/15 0630  NA 138  --  137 139  --  139 139  K 4.0  --  3.6 3.4*  --  3.4* 3.4*  CL 104  --  104 105  --  107 108  CO2 26  --  27 26  --  26 24  GLUCOSE 164*  --  209* 168*  --  195* 191*  BUN 10  --  14 12  --  15 14  CREATININE 1.26*  --  1.24 1.32*  --  1.22 1.17  CALCIUM 9.3  --  9.0 9.1  --  8.8* 8.7*  MG 2.1  < > 1.8 1.7 1.7 1.6* 1.7  < > = values in this interval not displayed. Liver Function Tests:  Recent Labs Lab 09/06/15 0559  AST 35  ALT 58  ALKPHOS  245*  BILITOT 0.9  PROT 5.7*  ALBUMIN 2.7*   No results for input(s): LIPASE, AMYLASE in the last 168 hours.  Recent Labs Lab 09/06/15 0559  AMMONIA 13   CBC:  Recent Labs Lab 09/03/15 0245 09/05/15 0445 09/07/15 1128 09/08/15 0630  WBC 9.2 7.7 8.6 9.1  HGB 11.9* 10.8* 10.3* 10.1*  HCT 37.4* 33.7* 32.0* 31.9*  MCV 94.0 91.3 92.0 94.7  PLT 270 273 255 237   Cardiac Enzymes: No results for input(s): CKTOTAL, CKMB, CKMBINDEX, TROPONINI in the last 168 hours. BNP: Invalid input(s): POCBNP CBG:  Recent Labs Lab 09/08/15 1150 09/08/15 1622 09/08/15 2117 09/09/15 0637 09/09/15 1131  GLUCAP 210* 192* 189* 174* 173*    Time coordinating discharge:  Greater than 30 minutes  Signed:  Carnesha Maravilla, DO [facility] Pager: [phone] 09/09/2015, 12:05 PM

## 2015-09-08 NOTE — Progress Notes (Signed)
Pharmacy Antibiotic Note  Jacob [name] is a 75 y.o. male admitted on 08/28/2015 with discitis.   Continues on Vancomycin with plans for 6 weeks of therapy  Afeb, wbc normal, scr normal. No changes in abx for now.   Plan: 1. Continue Vancomycin 500 mg iv Q 12 hours  2. Ceftriaxone 2g IV q 24 hrs. 3. Will plan on checking vancomycin level in a.m at steady state, unless he discharges then would recommend rechecking bmet/vanc trough early this week  Height: 5\' 11"  (180.3 cm) Weight: 226 lb 14.4 oz (102.921 kg) IBW/kg (Calculated) : 75.3  Temp (24hrs), Avg:98.6 F (37 C), Min:97.9 F (36.6 C), Max:99.1 F (37.3 C)   Recent Labs Lab 09/02/15 0630  09/02/15 0644 09/03/15 0245 09/05/15 0445 09/05/15 1950 09/06/15 0559 09/07/15 1128 09/08/15 0630  WBC  --   --  8.1 9.2 7.7  --   --  8.6 9.1  CREATININE  --   < > 1.31* 1.26* 1.24  --  1.32* 1.22 1.17  VANCOTROUGH 22*  --   --   --   --  25*  --   --   --   < > = values in this interval not displayed.  Estimated Creatinine Clearance: 66.6 mL/min (by C-G formula based on Cr of 1.17).    Allergies  Allergen Reactions  . Ace Inhibitors Other (See Comments)    Probably nausea and vomiting per patient   . Actonel [Risedronate] Nausea And Vomiting  . Ciprocinonide [Fluocinolone] Other (See Comments)    Probably nausea and vomiting per patient  . Flunisolide Other (See Comments)    Probably nausea and vomiting per patient   . Metformin And Related Other (See Comments)    Probably nausea and vomiting per patient   . Sertraline Other (See Comments)    Probably nausea and vomiting per patient   . Sulindac Other (See Comments)    Probably nausea and vomiting per patient   . Terazosin Other (See Comments)    Probably nausea and vomiting per patient     Antimicrobials this admission: Ceftriaxone 6/16>  Vancomycin 6/16>  Dose adjustments this admission: 6/19 VT = 22 (but doses off schedule a bit, and drawn ~ 10 hrs after  dose) > con't 750 mg q 12 hrs 6/22 VT = 25  Microbiology results: 6/16 BCx x 2 > NGF 6/16 Intervertebral disc fluid > NGF 6/22 urine - yeast 6/23 tissue(lumbar)> ngtd  Thank you, Erin Hearing PharmD., BCPS Clinical Pharmacist Pager [phone] 09/08/2015 12:42 PM

## 2015-09-09 ENCOUNTER — Encounter (HOSPITAL_COMMUNITY): Payer: Self-pay | Admitting: Neurosurgery

## 2015-09-09 DIAGNOSIS — M069 Rheumatoid arthritis, unspecified: Secondary | ICD-10-CM | POA: Diagnosis not present

## 2015-09-09 DIAGNOSIS — E118 Type 2 diabetes mellitus with unspecified complications: Secondary | ICD-10-CM | POA: Diagnosis not present

## 2015-09-09 DIAGNOSIS — M4806 Spinal stenosis, lumbar region: Secondary | ICD-10-CM | POA: Diagnosis not present

## 2015-09-09 DIAGNOSIS — F4323 Adjustment disorder with mixed anxiety and depressed mood: Secondary | ICD-10-CM | POA: Diagnosis not present

## 2015-09-09 DIAGNOSIS — C649 Malignant neoplasm of unspecified kidney, except renal pelvis: Secondary | ICD-10-CM | POA: Diagnosis not present

## 2015-09-09 DIAGNOSIS — M353 Polymyalgia rheumatica: Secondary | ICD-10-CM | POA: Diagnosis not present

## 2015-09-09 DIAGNOSIS — G9340 Encephalopathy, unspecified: Secondary | ICD-10-CM | POA: Diagnosis not present

## 2015-09-09 DIAGNOSIS — M6281 Muscle weakness (generalized): Secondary | ICD-10-CM | POA: Diagnosis not present

## 2015-09-09 DIAGNOSIS — E1140 Type 2 diabetes mellitus with diabetic neuropathy, unspecified: Secondary | ICD-10-CM | POA: Diagnosis not present

## 2015-09-09 DIAGNOSIS — I5032 Chronic diastolic (congestive) heart failure: Secondary | ICD-10-CM | POA: Diagnosis not present

## 2015-09-09 DIAGNOSIS — M4647 Discitis, unspecified, lumbosacral region: Secondary | ICD-10-CM | POA: Diagnosis not present

## 2015-09-09 DIAGNOSIS — F331 Major depressive disorder, recurrent, moderate: Secondary | ICD-10-CM | POA: Diagnosis not present

## 2015-09-09 DIAGNOSIS — R41841 Cognitive communication deficit: Secondary | ICD-10-CM | POA: Diagnosis not present

## 2015-09-09 DIAGNOSIS — I10 Essential (primary) hypertension: Secondary | ICD-10-CM | POA: Diagnosis not present

## 2015-09-09 DIAGNOSIS — R278 Other lack of coordination: Secondary | ICD-10-CM | POA: Diagnosis not present

## 2015-09-09 DIAGNOSIS — M5416 Radiculopathy, lumbar region: Secondary | ICD-10-CM

## 2015-09-09 DIAGNOSIS — J441 Chronic obstructive pulmonary disease with (acute) exacerbation: Secondary | ICD-10-CM | POA: Diagnosis not present

## 2015-09-09 DIAGNOSIS — M4646 Discitis, unspecified, lumbar region: Secondary | ICD-10-CM | POA: Diagnosis not present

## 2015-09-09 DIAGNOSIS — R7989 Other specified abnormal findings of blood chemistry: Secondary | ICD-10-CM | POA: Diagnosis not present

## 2015-09-09 DIAGNOSIS — I2510 Atherosclerotic heart disease of native coronary artery without angina pectoris: Secondary | ICD-10-CM | POA: Diagnosis not present

## 2015-09-09 DIAGNOSIS — G4730 Sleep apnea, unspecified: Secondary | ICD-10-CM | POA: Diagnosis not present

## 2015-09-09 LAB — BASIC METABOLIC PANEL
Anion gap: 8 (ref 5–15)
BUN: 15 mg/dL (ref 6–20)
CO2: 26 mmol/L (ref 22–32)
Calcium: 9.4 mg/dL (ref 8.9–10.3)
Chloride: 107 mmol/L (ref 101–111)
Creatinine, Ser: 1.41 mg/dL — ABNORMAL HIGH (ref 0.61–1.24)
GFR calc Af Amer: 55 mL/min — ABNORMAL LOW (ref >60)
GFR calc non Af Amer: 47 mL/min — ABNORMAL LOW (ref >60)
Glucose, Bld: 197 mg/dL — ABNORMAL HIGH (ref 65–99)
Potassium: 3.5 mmol/L (ref 3.5–5.1)
Sodium: 141 mmol/L (ref 135–145)

## 2015-09-09 LAB — MAGNESIUM: Magnesium: 1.9 mg/dL (ref 1.7–2.4)

## 2015-09-09 LAB — GLUCOSE, CAPILLARY
Glucose-Capillary: 174 mg/dL — ABNORMAL HIGH (ref 65–99)
Glucose-Capillary: 173 mg/dL — ABNORMAL HIGH (ref 65–99)
Glucose-Capillary: 167 mg/dL — ABNORMAL HIGH (ref 65–99)

## 2015-09-09 LAB — VANCOMYCIN, TROUGH: Vancomycin Tr: 17 ug/mL (ref 10.0–20.0)

## 2015-09-09 MED ORDER — HEPARIN SOD (PORK) LOCK FLUSH 100 UNIT/ML IV SOLN
250.0000 [IU] | INTRAVENOUS | Status: DC | PRN
  Administered 2015-09-09: 250 [IU]
  Filled 2015-09-09: qty 3

## 2015-09-09 MED ORDER — CEFTRIAXONE SODIUM 2 G IJ SOLR
2.0000 g | INTRAMUSCULAR | Status: DC
Start: 2015-09-09 — End: 2015-10-14

## 2015-09-09 MED ORDER — VANCOMYCIN HCL IN DEXTROSE 1-5 GM/200ML-% IV SOLN
1000.0000 mg | INTRAVENOUS | Status: DC
  Filled 2015-09-09: qty 200

## 2015-09-09 MED ORDER — VANCOMYCIN HCL 500 MG IV SOLR: 500.0000 mg | Freq: Two times a day (BID) | INTRAVENOUS | Status: DC

## 2015-09-09 MED ORDER — INSULIN NPH ISOPHANE & REGULAR (70-30) 100 UNIT/ML ~~LOC~~ SUSP: 20.0000 [IU] | Freq: Two times a day (BID) | SUBCUTANEOUS | Status: DC

## 2015-09-09 MED ORDER — HEPARIN SOD (PORK) LOCK FLUSH 100 UNIT/ML IV SOLN: 250.0000 [IU] | Freq: Every day | INTRAVENOUS | Status: DC

## 2015-09-09 NOTE — Clinical Social Work Note (Signed)
Currently awaiting [facility] SNF authorization.   Clinical Social Worker facilitated patient discharge including contacting patient family and facility to confirm patient discharge plans.  Clinical information faxed to facility and family agreeable with plan.  CSW arranged ambulance transport via [facility] to [facility].  RN to call report prior to discharge.  Clinical Social Worker will sign off for now as social work intervention is no longer needed. Please consult [name] again if new need arises.  Glendon Axe, MSW, LCSWA [phone] 09/09/2015 12:19 PM

## 2015-09-09 NOTE — Progress Notes (Signed)
Pharmacy Antibiotic Note  Jacob [name] is a 75 y.o. male admitted on 08/28/2015 with discitis.   Continues on Vancomycin with plans for 6 weeks of therapy  Afeb, wbc normal, scr up a bit today to 1.41. 6/26 vancomycin level = 17 (14.5 hr level). Was drawn late.  Plan: 1. Adjust Vancomycin to 1000 mg iv Q 24 hours  2. Continue ceftriaxone 2g IV q 24 hrs. 3. Will plan on checking another vancomycin level at new steady state, if remains inpatient. 4. If discharged recommend a BMET and vancomycin trough in 4-5 days  Height: 5\' 11"  (180.3 cm) Weight: 220 lb 1.6 oz (99.837 kg) IBW/kg (Calculated) : 75.3  Temp (24hrs), Avg:98.9 F (37.2 C), Min:98.5 F (36.9 C), Max:99.1 F (37.3 C)   Recent Labs Lab 09/03/15 0245 09/05/15 0445 09/05/15 1950 09/06/15 0559 09/07/15 1128 09/08/15 0630 09/09/15 1315 09/09/15 1316  WBC 9.2 7.7  --   --  8.6 9.1  --   --   CREATININE 1.26* 1.24  --  1.32* 1.22 1.17 1.41*  --   VANCOTROUGH  --   --  25*  --   --   --   --  17    Estimated Creatinine Clearance: 54.5 mL/min (by C-G formula based on Cr of 1.41).    Allergies  Allergen Reactions  . Ace Inhibitors Other (See Comments)    Probably nausea and vomiting per patient   . Actonel [Risedronate] Nausea And Vomiting  . Ciprocinonide [Fluocinolone] Other (See Comments)    Probably nausea and vomiting per patient  . Flunisolide Other (See Comments)    Probably nausea and vomiting per patient   . Metformin And Related Other (See Comments)    Probably nausea and vomiting per patient   . Sertraline Other (See Comments)    Probably nausea and vomiting per patient   . Sulindac Other (See Comments)    Probably nausea and vomiting per patient   . Terazosin Other (See Comments)    Probably nausea and vomiting per patient     Antimicrobials this admission: Ceftriaxone 6/16>  Vancomycin 6/16>  Dose adjustments this admission: 6/19 VT = 22 (but doses off schedule a bit, and drawn ~ 10  hrs after dose) > con't 750 mg q 12 hrs 6/22 VT = 25 6/26 VT = 17(drawn late ~14.5 hr level)  Microbiology results: 6/16 BCx x 2 > NGF 6/16 Intervertebral disc fluid > NGF 6/22 urine - yeast 6/23 tissue(lumbar)> ngtd   Thank you for allowing [name] to participate in this patients care. Jens Som, PharmD Pager: [phone]  09/09/2015 2:56 PM

## 2015-09-09 NOTE — Progress Notes (Signed)
Physical Therapy Treatment Patient Details Name: Jacob [name] MRN: [name] DOB: 1941-01-08 Today's Date: 09/09/2015    History of Present Illness Jacob [name] is a 75 y.o. male with medical history significant of lumbar stenosis, COPD, rheumatoid arthritis, hypertension, sleep apnea, CAD, DM 2, neuropathy, PMR, admitted for acute encephalopathy. Pt had a L3/4 laminiectomy discectomy done in May and was d/c'd to [facility] and was then d/c'd home on 6/8. He then progressively declined both physically and cognitively. Pt then underwent a redo L3/4 discectomy on 6/23.    PT Comments    Patient is making gradual progress toward mobility goals. Continues to demo confusion and disoriented to place. Mod A +2 for sit to stand transfers and worked on pre gait activities with pt limited by bilat LE weakness. Current plan remains appropriate.   Follow Up Recommendations  SNF;Supervision/Assistance - 24 hour     Equipment Recommendations  None recommended by PT    Recommendations for Other Services       Precautions / Restrictions Precautions Precautions: Fall;Back Precaution Booklet Issued: No Precaution Comments: reviewed 3/3 back precuations; pt unable to recall precautions beginning and end of session Restrictions Weight Bearing Restrictions: No    Mobility  Bed Mobility               General bed mobility comments: OOB in chair upon arrival  Transfers Overall transfer level: Needs assistance Equipment used: Rolling walker (2 wheeled) Transfers: Sit to/from Stand Sit to Stand: +2 physical assistance;Mod assist         General transfer comment: X 3 from recliner; assist to power up into standing; pt wanted to sit after short periods of standing due to c/o bilat LE weakness; verbal and tactile cues for hand placement and posture; worked on weight shifting in standing and pt took L step forward and back but fatigued and needed to sit  Ambulation/Gait                  Stairs            Wheelchair Mobility    Modified Rankin (Stroke Patients Only)       Balance   Sitting-balance support: Feet supported;No upper extremity supported Sitting balance-Leahy Scale: Fair     Standing balance support: Bilateral upper extremity supported Standing balance-Leahy Scale: Poor                      Cognition Arousal/Alertness: Awake/alert Behavior During Therapy: WFL for tasks assessed/performed Overall Cognitive Status: Impaired/Different from baseline Area of Impairment: Orientation;Memory;Safety/judgement;Problem solving Orientation Level: Disoriented to;Place   Memory: Decreased recall of precautions   Safety/Judgement: Decreased awareness of safety;Decreased awareness of deficits Awareness: Intellectual Problem Solving: Slow processing;Decreased initiation;Requires verbal cues General Comments: pt did not think he was in the hospital    Exercises      General Comments        Pertinent Vitals/Pain Pain Assessment: 0-10 Pain Score: 10-Worst pain ever Pain Location: back and bilat LE Pain Descriptors / Indicators: Sore Pain Intervention(s): Limited activity within patient's tolerance;Monitored during session;Premedicated before session;Repositioned;RN gave pain meds during session    Home Living                      Prior Function            PT Goals (current goals can now be found in the care plan section) Acute Rehab PT Goals Patient Stated Goal: none stated  Progress towards PT goals: Progressing toward goals    Frequency  Min 5X/week    PT Plan Current plan remains appropriate    Co-evaluation             End of Session Equipment Utilized During Treatment: Gait belt Activity Tolerance: Patient limited by fatigue Patient left: in chair;with call bell/phone within reach;with chair alarm set     Time: 1040-1103 PT Time Calculation (min) (ACUTE ONLY): 23 min  Charges:  $Therapeutic  Activity: 23-37 mins                    G Codes:      Jacob [name], PTA Pager: [phone]   09/09/2015, 12:58 PM

## 2015-09-09 NOTE — Clinical Social Work Note (Signed)
HTA authorization obtained [id number].   CSW to arrange [facility].   Glendon Axe, MSW, LCSWA [phone] 09/09/2015 2:58 PM

## 2015-09-09 NOTE — Consult Note (Signed)
[facility] CM Inpatient Consult   09/09/2015  Jacob [name] 1941-03-16 [id number]  Patient was admitted for altered mental status. Patient is a  75 y.o. Admitted with HX of Anxiety, Lumbar stenosis S/P Laminectomy COPD, RA, HTN, CAD native artery S/P CABG 7 vessel, DM Type 2 uncontrolled with complication, DM neuropathy, with multiple comorbidities per electronic medial record. This is the patient's 2nd admission in the past 3 months. Met with the patient at the bedside.  Patient with some confusion stating he plan to return home.  Chart reveals patient to go to a skilled nursing facility for rehab where patient will have 24 hour supervision planned.  He did state that his wife, "is in no shape to take care of me."  Patient is a Engineer, maintenance (IT) member.  For questions, please contact:  Natividad Brood, RN BSN [facility] Liaison  [phone] business mobile phone Toll free office [phone]

## 2015-09-09 NOTE — Clinical Social Work Placement (Signed)
   CLINICAL SOCIAL WORK PLACEMENT  NOTE  Date:  09/09/2015  Patient Details  Name: Jacob [name] MRN: [name] Date of Birth: Nov 16, 1940  Clinical Social Work is seeking post-discharge placement for this patient at the [place] level of care (*CSW will initial, date and re-position this form in  chart as items are completed):  Yes   Patient/family provided with [place] Work Department's list of facilities offering this level of care within the geographic area requested by the patient (or if unable, by the patient's family).  Yes   Patient/family informed of their freedom to choose among providers that offer the needed level of care, that participate in Medicare, Medicaid or managed care program needed by the patient, have an available bed and are willing to accept the patient.  Yes   Patient/family informed of [facility]'s ownership interest in [facility] and [facility], as well as of the fact that they are under no obligation to receive care at these facilities.  PASRR submitted to EDS on 09/03/15     PASRR number received on       Existing PASRR number confirmed on 09/03/15     FL2 transmitted to all facilities in geographic area requested by pt/family on 09/06/15     FL2 transmitted to all facilities within larger geographic area on       Patient informed that his/her managed care company has contracts with or will negotiate with certain facilities, including the following:        Yes   Patient/family informed of bed offers received.  Patient chooses bed at  [facility] )     Physician recommends and patient chooses bed at      Patient to be transferred to  [facility] ) on 09/09/15.  Patient to be transferred to facility by  [facility] )     Patient family notified on 09/09/15 of transfer.  Name of family member notified:   (Pt's wife and dtr, Hilda Blades)     PHYSICIAN Please sign FL2, Please  prepare priority discharge summary, including medications     Additional Comment:    _______________________________________________ Rozell Searing, LCSW 09/09/2015, 12:19 PM

## 2015-09-09 NOTE — Progress Notes (Signed)
Report called to [facility] facility. RN had no questions at this time. Additional IV's were discontinued. [facility] called for transport and IV team consulted to heparin flush IV before departure.

## 2015-09-09 NOTE — Care Management Note (Signed)
Case Management Note  Patient Details  Name: Jacob [name] MRN: [name] Date of Birth: 25-Aug-1940  Subjective/Objective:                    Action/Plan: Plan is for patient to discharge to [facility] today. No further needs per CM.   Expected Discharge Date:                  Expected Discharge Plan:  [place]  In-House Referral:  Clinical Social Work  Discharge planning Services  CM Consult  Post Acute Care Choice:    Choice offered to:     DME Arranged:  IV pump/equipment DME Agency:  [place]:  RN [facility] Agency:  [place]  Status of Service:  In process, will continue to follow  If discussed at Long Length of Stay Meetings, dates discussed:    Additional Comments:  Pollie Friar, RN 09/09/2015, 12:22 PM

## 2015-09-09 NOTE — Care Management Important Message (Signed)
Important Message  Patient Details  Name: Jacob [name] MRN: [name] Date of Birth: 1940/04/14   Medicare Important Message Given:  Yes    Loann Quill 09/09/2015, 8:56 AM

## 2015-09-10 DIAGNOSIS — R7989 Other specified abnormal findings of blood chemistry: Secondary | ICD-10-CM | POA: Diagnosis not present

## 2015-09-10 DIAGNOSIS — G4733 Obstructive sleep apnea (adult) (pediatric): Secondary | ICD-10-CM | POA: Diagnosis not present

## 2015-09-10 DIAGNOSIS — Z8709 Personal history of other diseases of the respiratory system: Secondary | ICD-10-CM | POA: Diagnosis not present

## 2015-09-10 DIAGNOSIS — I5030 Unspecified diastolic (congestive) heart failure: Secondary | ICD-10-CM | POA: Diagnosis not present

## 2015-09-10 DIAGNOSIS — F4323 Adjustment disorder with mixed anxiety and depressed mood: Secondary | ICD-10-CM | POA: Diagnosis not present

## 2015-09-10 DIAGNOSIS — E1140 Type 2 diabetes mellitus with diabetic neuropathy, unspecified: Secondary | ICD-10-CM | POA: Diagnosis not present

## 2015-09-10 DIAGNOSIS — R4182 Altered mental status, unspecified: Secondary | ICD-10-CM | POA: Diagnosis not present

## 2015-09-10 DIAGNOSIS — N289 Disorder of kidney and ureter, unspecified: Secondary | ICD-10-CM | POA: Diagnosis not present

## 2015-09-10 DIAGNOSIS — G9340 Encephalopathy, unspecified: Secondary | ICD-10-CM | POA: Diagnosis not present

## 2015-09-10 DIAGNOSIS — M868X8 Other osteomyelitis, other site: Secondary | ICD-10-CM | POA: Diagnosis not present

## 2015-09-10 DIAGNOSIS — C649 Malignant neoplasm of unspecified kidney, except renal pelvis: Secondary | ICD-10-CM | POA: Diagnosis not present

## 2015-09-10 DIAGNOSIS — I2510 Atherosclerotic heart disease of native coronary artery without angina pectoris: Secondary | ICD-10-CM | POA: Diagnosis not present

## 2015-09-12 LAB — AEROBIC/ANAEROBIC CULTURE W GRAM STAIN (SURGICAL/DEEP WOUND): Culture: NO GROWTH

## 2015-09-12 LAB — AEROBIC/ANAEROBIC CULTURE (SURGICAL/DEEP WOUND): Culture: NO GROWTH

## 2015-09-14 DIAGNOSIS — J441 Chronic obstructive pulmonary disease with (acute) exacerbation: Secondary | ICD-10-CM | POA: Diagnosis not present

## 2015-09-14 DIAGNOSIS — M6281 Muscle weakness (generalized): Secondary | ICD-10-CM | POA: Diagnosis not present

## 2015-09-14 DIAGNOSIS — F331 Major depressive disorder, recurrent, moderate: Secondary | ICD-10-CM | POA: Diagnosis not present

## 2015-09-14 DIAGNOSIS — I10 Essential (primary) hypertension: Secondary | ICD-10-CM | POA: Diagnosis not present

## 2015-09-14 DIAGNOSIS — E1140 Type 2 diabetes mellitus with diabetic neuropathy, unspecified: Secondary | ICD-10-CM | POA: Diagnosis not present

## 2015-09-14 DIAGNOSIS — M069 Rheumatoid arthritis, unspecified: Secondary | ICD-10-CM | POA: Diagnosis not present

## 2015-09-14 DIAGNOSIS — I2510 Atherosclerotic heart disease of native coronary artery without angina pectoris: Secondary | ICD-10-CM | POA: Diagnosis not present

## 2015-09-14 DIAGNOSIS — C649 Malignant neoplasm of unspecified kidney, except renal pelvis: Secondary | ICD-10-CM | POA: Diagnosis not present

## 2015-09-14 DIAGNOSIS — R4181 Age-related cognitive decline: Secondary | ICD-10-CM | POA: Diagnosis not present

## 2015-09-14 DIAGNOSIS — M4806 Spinal stenosis, lumbar region: Secondary | ICD-10-CM | POA: Diagnosis not present

## 2015-09-14 DIAGNOSIS — M353 Polymyalgia rheumatica: Secondary | ICD-10-CM | POA: Diagnosis not present

## 2015-09-14 DIAGNOSIS — R278 Other lack of coordination: Secondary | ICD-10-CM | POA: Diagnosis not present

## 2015-09-14 DIAGNOSIS — I5032 Chronic diastolic (congestive) heart failure: Secondary | ICD-10-CM | POA: Diagnosis not present

## 2015-09-14 DIAGNOSIS — G4730 Sleep apnea, unspecified: Secondary | ICD-10-CM | POA: Diagnosis not present

## 2015-09-16 DIAGNOSIS — N184 Chronic kidney disease, stage 4 (severe): Secondary | ICD-10-CM | POA: Diagnosis not present

## 2015-09-16 DIAGNOSIS — M8610 Other acute osteomyelitis, unspecified site: Secondary | ICD-10-CM | POA: Diagnosis not present

## 2015-09-16 DIAGNOSIS — I25810 Atherosclerosis of coronary artery bypass graft(s) without angina pectoris: Secondary | ICD-10-CM | POA: Diagnosis not present

## 2015-09-16 DIAGNOSIS — E1129 Type 2 diabetes mellitus with other diabetic kidney complication: Secondary | ICD-10-CM | POA: Diagnosis not present

## 2015-09-16 DIAGNOSIS — I10 Essential (primary) hypertension: Secondary | ICD-10-CM | POA: Diagnosis not present

## 2015-09-16 DIAGNOSIS — J449 Chronic obstructive pulmonary disease, unspecified: Secondary | ICD-10-CM | POA: Diagnosis not present

## 2015-09-24 DIAGNOSIS — Z452 Encounter for adjustment and management of vascular access device: Secondary | ICD-10-CM | POA: Diagnosis not present

## 2015-09-24 DIAGNOSIS — R1110 Vomiting, unspecified: Secondary | ICD-10-CM | POA: Diagnosis not present

## 2015-09-30 ENCOUNTER — Encounter: Payer: Self-pay | Admitting: Neurology

## 2015-09-30 ENCOUNTER — Ambulatory Visit (INDEPENDENT_AMBULATORY_CARE_PROVIDER_SITE_OTHER): Payer: PPO | Admitting: Neurology

## 2015-09-30 VITALS — BP 132/64 | HR 98 | Temp 98.3°F

## 2015-09-30 DIAGNOSIS — R404 Transient alteration of awareness: Secondary | ICD-10-CM | POA: Diagnosis not present

## 2015-09-30 DIAGNOSIS — F32A Depression, unspecified: Secondary | ICD-10-CM

## 2015-09-30 DIAGNOSIS — F329 Major depressive disorder, single episode, unspecified: Secondary | ICD-10-CM

## 2015-09-30 NOTE — Patient Instructions (Signed)
1. Schedule routine EEG 2. Refer to Psychiatry Dr. Casimiro Needle 3. Review pain medications, minimize as much as possible 4. Follow-up in 2 months

## 2015-09-30 NOTE — Progress Notes (Signed)
NEUROLOGY CONSULTATION NOTE  MIZELL MERLOS MRN: [name] DOB: 20-May-1940  Referring provider: Dr. Reynold Bowen Primary care provider: Dr. Reynold Bowen  Reason for consult:  Memory changes  Dear Dr Forde Dandy:  Thank you for your kind referral of Jacob [name] for consultation of the above symptoms. Although his history is well known to you, please allow me to reiterate it for the purpose of our medical record. The patient was accompanied to the clinic by his wife and daughter who also provide collateral information. Records and images were personally reviewed where available.  HISTORY OF PRESENT ILLNESS: This is a 75 year old right-handed man with a history of hypertension, OSA, CAD s/p CABG, CKD, diabetes, COPD, polymyalgia rheumatica, anxiety, lumbar stenosis s/p surgery, presenting for evaluation of cognitive changes that started after his second back surgery per family. Records from his hospital stay were reviewed, he was admitted to [facility] last 08/28/15 for worsening confusion. After his surgery, he was discharged home from the SNF. Two days later, he started getting progressively confused, threatening his family. He was evaluated at the [facility] and told that "nothing is wrong with him," and sent to his PCP. He went to the ER and had an infectious workup which was unremarkable. There was concern for early discitis/osteomyelitis on back imaging and was transferred to [facility] where he was started on antibiotics. He had a redo L3/4 discectomy on 09/06/15, and his family reports that mental status would wax and wane, he would refuse to eat. It was felt that he likely had a underlying cognitive impairment with recent stressors causing decompensation. He was evaluated by Psychiatry due to threat of self-harm and was diagnosed with Adjustment disorder with mixed emotional features. Paxil was discontinued and he was started on Trazodone for sleep/agression/depression.  His family report that prior to his  surgery, he had no cognitive issues, he was "always in charge of everything, taking care of everybody." He was previously in charge of bill payments, driving without getting lost, no difficulties with ADLs. Since the surgery, he does not eat or drink, refuses his medications and physical therapy, and gets very angry particularly at his wife. His family has noticed short-term memory changes, but reports he recognizes all his family. 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 He would have good and bad days, his wife reports he had a really wonderful 4th of July but since then he has been having more difficulties. His daughter reports that he would be completely coherent then suddenly have a "strange look like his brain is shorting out," and would lose it for a while. She has not seen this during the daytime, and has noticed this more at night. His wife reports labile mood, "like turning on a light switch," and he would tell them not to leave him there and that he is in jail. She reports an episode last Saturday when all of a sudden he started gritting his teeth and his eyes got "big and bulky," he kept repeating "don't leave me here, you are not listening," and could not be redirected/consoled. His daughter has noticed that when he is staring off, he is playing with his clothes (?automatisms?). They notice these episodes more when he gets agitated or tired.   He is drowsy in the office today but is arousable, and states  he feels the "end of the world has come" and he is "not happy." He feels that "they are pumping too much medications in me." He started crying at the end of the visit. His daughter feels he is depressed. He reports feeling dizzy but cannot elaborate. He reports a lot of nightmares. He feels confused and states he is sleeping all the time. He  continues to deal with back pain and states he is "hurting all the time," but his family reports pain medications are being minimized, with usually one oxycodone at bedtime or prior to PT. Tramadol is listed on his medications but they report he does not take it. They deny any prior history of alcohol intake, no recent travel, head injuries, or family history of similar symptoms or dementia. He reports his right leg feels weak, and states that he is not doing good with PT. He denies any headaches, vision changes, dysarthria/dysphagia, bowel/bladder dysfunction. He had a normal birth and early development. There is no history of febrile seizures, CNS infections, significant head injury, or brain surgery. No family history of seizures.   Diagnostic Data: I personally reviewed MRI brain without contrast done 08/29/15 which did not show any acute changes. There was mild to moderate chronic microvascular disease.  PAST MEDICAL HISTORY: Past Medical History  Diagnosis Date  . Allergy   . Asthma   . Diabetes mellitus without complication ([place])   . Cataract   . Arthritis   . Polymyalgia rheumatica (HCC)     maintained on Prednisone, Plaquenil. Followed by rhuematology every 4 months/[name].  Marland Kitchen COPD (chronic obstructive pulmonary disease) ([place])   . Anemia   . Hypertension   . BPH (benign prostatic hyperplasia)   . Sleep apnea     CPAP   Trying to use  . Shortness of breath dyspnea     with exertion  . Anxiety   . Chronic kidney disease     chronic  kidney failure  kidney function at 42%  . Coronary artery disease     CABG  7 bypasses  . Hepatitis     many years ago  . Cancer of kidney [facility])     PAST SURGICAL HISTORY: Past Surgical History  Procedure Laterality Date  . Appendectomy    . Hernia repair    . Prostate surgery      TURP at [facility].  Marland Kitchen Coronary artery bypass graft  03/17/1995    Wynonia Lawman; followed every six months.  . Cataract extraction w/phaco Right 11/28/2014    Procedure: CATARACT  EXTRACTION PHACO AND INTRAOCULAR LENS PLACEMENT (IOC) RIGHT ;  Surgeon: Marylynn Pearson, MD;  Location: [place];  Service: Ophthalmology;  Laterality: Right;  . Lumbar laminectomy/decompression microdiscectomy N/A 07/30/2015    Procedure: LUMBAR LAMINECTOMY DISCECTOMY ;  Surgeon: Ashok Pall, MD;  Location: [place] [facility];  Service: Neurosurgery;  Laterality: N/A;  LUMBAR LAMINECTOMY DISCECTOMY   . Lumbar laminectomy/decompression microdiscectomy N/A 09/06/2015    Procedure: Redo L3/4 Disectomy;  Surgeon: Ashok Pall, MD;  Location: [place] [facility];  Service: Neurosurgery;  Laterality: N/A;    MEDICATIONS: Current Outpatient Prescriptions on File Prior to Visit  Medication Sig Dispense Refill  . acetaminophen (TYLENOL) 325 MG tablet Take 2 tablets (650 mg total) by mouth every 6 (six) hours as needed for mild pain (or Fever >/= 101).    Marland Kitchen acidophilus (RISAQUAD) CAPS capsule Take 2 capsules by mouth daily.    Marland Kitchen albuterol (PROVENTIL HFA;VENTOLIN HFA) 108 (90 BASE) MCG/ACT inhaler Inhale 2  puffs into the lungs every 6 (six) hours as needed for wheezing or shortness of breath. 1 Inhaler 2  . allopurinol (ZYLOPRIM) 300 MG tablet Take 300 mg by mouth daily with lunch.    Marland Kitchen amLODipine (NORVASC) 10 MG tablet Take 10 mg by mouth daily with supper.     Marland Kitchen aspirin EC 81 MG tablet Take 81 mg by mouth daily with lunch.    Marland Kitchen atorvastatin (LIPITOR) 20 MG tablet Take 1 tablet (20 mg total) by mouth daily at 6 PM. 30 tablet 1  . Brinzolamide-Brimonidine (SIMBRINZA) 1-0.2 % SUSP Place 1 drop into both eyes 2 (two) times daily.     . budesonide-formoterol (SYMBICORT) 160-4.5 MCG/ACT inhaler Inhale 2 puffs into the lungs 2 (two) times daily.    . calcitRIOL (ROCALTROL) 0.25 MCG capsule Take 0.25 mcg by mouth every Monday, Wednesday, and Friday.    . cefTRIAXone 2 g in dextrose 5 % 50 mL Inject 2 g into the vein daily. Last day antibiotics on 09/26/15 17 vial 0  . cycloSPORINE (RESTASIS) 0.05 % ophthalmic emulsion Place 1 drop  into both eyes 2 (two) times daily.    . feeding supplement, GLUCERNA SHAKE, (GLUCERNA SHAKE) LIQD Take 237 mLs by mouth 3 (three) times daily between meals.  0  . finasteride (PROSCAR) 5 MG tablet Take 5 mg by mouth at bedtime.     . insulin NPH-regular Human (NOVOLIN 70/30) (70-30) 100 UNIT/ML injection Inject 20 Units into the skin 2 (two) times daily with a meal. 10 mL 0  . ipratropium-albuterol (DUONEB) 0.5-2.5 (3) MG/3ML SOLN Take 3 mLs by nebulization every 8 (eight) hours. Reported on 03/27/2015    . Liraglutide (VICTOZA) 18 MG/3ML SOPN Inject 1.2 mg into the skin daily at 3 pm.     . metoprolol (LOPRESSOR) 100 MG tablet Take 1 tablet (100 mg total) by mouth 2 (two) times daily. 60 tablet 1  . montelukast (SINGULAIR) 10 MG tablet Take 1 tablet (10 mg total) by mouth at bedtime. 90 tablet 3  . Multiple Vitamins-Minerals (PRESERVISION AREDS 2 PO) Take 1 tablet by mouth 2 (two) times daily.     Marland Kitchen oxyCODONE (OXY IR/ROXICODONE) 5 MG immediate release tablet Take 1 tablet (5 mg total) by mouth every 4 (four) hours as needed for severe pain. 20 tablet 0  . pilocarpine (PILOCAR) 2 % ophthalmic solution Place 1 drop into both eyes 3 (three) times daily.    . polyethylene glycol (MIRALAX / GLYCOLAX) packet Take 17 g by mouth 2 (two) times daily.    . predniSONE (DELTASONE) 1 MG tablet Take 2 mg by mouth daily with breakfast. IN CONJUNCTION WITH ONE 5 MG TABLET TO EQUAL A TOTAL OF 7 MILLIGRAMS    . predniSONE (DELTASONE) 5 MG tablet Take 5 mg by mouth daily with breakfast. IN CONJUNCTION WITH TWO 1 MG TABLETS TO EQUAL A TOTAL OF 7 MILLIGRAMS    . sennosides-docusate sodium (SENOKOT-S) 8.6-50 MG tablet Take 2 tablets by mouth 2 (two) times daily.    . sodium chloride 0.9 % nebulizer solution Take 3 mLs by nebulization every 6 (six) hours as needed for wheezing.    . Tamsulosin HCl (FLOMAX) 0.4 MG CAPS Take 0.4 mg by mouth at bedtime.     . traMADol (ULTRAM) 50 MG tablet Take 1 tablet (50 mg total) by  mouth every 6 (six) hours as needed for moderate pain. 30 tablet 0  . traZODone (DESYREL) 100 MG tablet Take 1 tablet (100 mg total)  by mouth at bedtime. 30 tablet 0  . vancomycin 500 mg in sodium chloride 0.9 % 100 mL Inject 500 mg into the vein every 12 (twelve) hours. Last day antibiotic on 09/26/15 34 vial 0   No current facility-administered medications on file prior to visit.    ALLERGIES: Allergies  Allergen Reactions  . Ace Inhibitors Other (See Comments)    Probably nausea and vomiting per patient   . Actonel [Risedronate] Nausea And Vomiting  . Ciprocinonide [Fluocinolone] Other (See Comments)    Probably nausea and vomiting per patient  . Flunisolide Other (See Comments)    Probably nausea and vomiting per patient   . Metformin And Related Other (See Comments)    Probably nausea and vomiting per patient   . Sertraline Other (See Comments)    Probably nausea and vomiting per patient   . Sulindac Other (See Comments)    Probably nausea and vomiting per patient   . Terazosin Other (See Comments)    Probably nausea and vomiting per patient     FAMILY HISTORY: Family History  Problem Relation Age of Onset  . Hypertension Other     SOCIAL HISTORY: Social History   Social History  . Marital Status: Married    Spouse Name: N/A  . Number of Children: N/A  . Years of Education: N/A   Occupational History  . Driver    Social History Main Topics  . Smoking status: Former Smoker    Quit date: 03/16/1957  . Smokeless tobacco: Never Used  . Alcohol Use: No  . Drug Use: No  . Sexual Activity: Yes    Birth Control/ Protection: None   Other Topics Concern  . Not on file   Social History Narrative   Marital status: Married x 55 years.      Children: 3 children; 5 grandchildren; 6 gg.      Lives: with wife.        Employment: retired; [employer].  Working every other week; 45 hours.      Tobacco:  Quit at age 64.      Alcohol:  None        Exercise: none      ADLs: indepedent with ADLs;  Uses cane and has a walker.  Drives.      Advanced Directives:  +Living Will.  DNR/DNI.  Wife is HCPOA.    REVIEW OF SYSTEMS: Constitutional: No fevers, chills, or sweats, no generalized fatigue, change in appetite Eyes: No visual changes, double vision, eye pain Ear, nose and throat: No hearing loss, ear pain, nasal congestion, sore throat Cardiovascular: No chest pain, palpitations Respiratory:  No shortness of breath at rest or with exertion, wheezes GastrointestinaI: No nausea, vomiting, diarrhea, abdominal pain, fecal incontinence Genitourinary:  No dysuria, urinary retention or frequency Musculoskeletal:  No neck pain, + back pain Integumentary: No rash, pruritus, skin lesions Neurological: as above Psychiatric: + depression, anxiety. No insomnia Endocrine: No palpitations, fatigue, diaphoresis, +mood swings, change in appetite, no change in weight, increased thirst Hematologic/Lymphatic:  No anemia, purpura, petechiae. Allergic/Immunologic: no itchy/runny eyes, nasal congestion, recent allergic reactions, rashes  PHYSICAL EXAM: Filed Vitals:   09/30/15 0920  BP: 132/64  Pulse: 98  Temp: 98.3 F (36.8 C)   General: No acute distress, sitting on wheelchair, drowsy but arousable. Flat affect, cried/emotional at the end of the visit stating he did not want to go back to the [place] Head:  Normocephalic/atraumatic Eyes: Fundoscopic exam shows bilateral sharp discs,  no vessel changes, exudates, or hemorrhages Neck: supple, no paraspinal tenderness, full range of motion Back: No paraspinal tenderness Heart: regular rate and rhythm Lungs: Clear to auscultation bilaterally. Vascular: No carotid bruits. Skin/Extremities: No rash, no edema Neurological Exam: Mental status: alert and oriented to person, year. Stated month is August (is it July). Kept perseverating on 2017 when asked other orientation questions. No dysarthria or  aphasia, Fund of knowledge is appropriate.  Recent and remote memory are impaired.  Attention and concentration are reduced.    Able to name objects and repeat phrases.  MMSE - Mini Mental State Exam 09/30/2015  Orientation to time 1  Orientation to Place 0  Registration 3  Attention/ Calculation 0  Recall 0  Language- name 2 objects 2  Language- repeat 1  Language- follow 3 step command 1  Language- read & follow direction 1  Write a sentence 0  Copy design 0  Total score 9   Cranial nerves: CN I: not tested CN II: pupils irregular bilaterally, unreactive to light (post-surgical), visual fields intact, fundi unremarkable. CN III, IV, VI:  full range of motion, no nystagmus, no ptosis CN V: facial sensation intact CN VII: upper and lower face symmetric CN VIII: hearing intact to finger rub CN IX, X: gag intact, uvula midline CN XI: sternocleidomastoid and trapezius muscles intact CN XII: tongue midline Bulk & Tone: normal, no fasciculations. Motor: 5/5 throughout with no pronator drift. Sensation: intact to light touch, cold, pin, vibration and joint position sense.  No extinction to double simultaneous stimulation.  Deep Tendon Reflexes: +1 throughout, no ankle clonus Plantar responses: downgoing bilaterally Cerebellar: no incoordination on finger to nose Gait: not tested, sitting on wheelchair drowsy Tremor: none  IMPRESSION: This is a 75 year old right-handed man with a history of hypertension, OSA, CAD s/p CABG, CKD, diabetes, COPD, polymyalgia rheumatica, anxiety, lumbar stenosis s/p surgery, presenting for evaluation of cognitive changes that started after his second back surgery per family. Neurological exam is largely non-focal, however he is drowsy during the visit and could not fully participate with memory testing (MMSE 9/30). The etiology of his current mental status changes is unclear, MRI brain unremarkable, TSH and B12 in hospital normal. There is concern for possible  underlying dementia, however with history given by family, there does not seem any clear history of this. Would repeat memory testing when he is more alert. With flat affect, depression could cause anhedonia, agitation, poor appetite, however with report of staring episodes, seizures are considered but less likely. A routine EEG will be ordered. He will be referred to Psychiatry for evaluation and treatment. His wife asks about returning to home, would recommend continued 24-hour care at SNF at this time. Would also recommend medication review with his PCP, minimizing pain/sedating medications as much as possible. He will follow-up in 2 months.   Thank you for allowing me to participate in the care of this patient. Please do not hesitate to call for any questions or concerns.   Ellouise Newer, M.D.  CC: Dr. Forde Dandy

## 2015-10-01 ENCOUNTER — Inpatient Hospital Stay (HOSPITAL_COMMUNITY)
Admission: EM | Admit: 2015-10-01 | Discharge: 2015-10-14 | DRG: 871 | Disposition: A | Discharge status: Skilled Nursing Facility | Payer: PPO | Attending: Internal Medicine | Admitting: Internal Medicine

## 2015-10-01 ENCOUNTER — Telehealth: Payer: Self-pay | Admitting: Neurology

## 2015-10-01 ENCOUNTER — Emergency Department (HOSPITAL_COMMUNITY): Payer: PPO

## 2015-10-01 ENCOUNTER — Encounter (HOSPITAL_COMMUNITY): Payer: Self-pay

## 2015-10-01 DIAGNOSIS — N179 Acute kidney failure, unspecified: Secondary | ICD-10-CM | POA: Diagnosis present

## 2015-10-01 DIAGNOSIS — E876 Hypokalemia: Secondary | ICD-10-CM | POA: Diagnosis present

## 2015-10-01 DIAGNOSIS — Z7951 Long term (current) use of inhaled steroids: Secondary | ICD-10-CM

## 2015-10-01 DIAGNOSIS — Z951 Presence of aortocoronary bypass graft: Secondary | ICD-10-CM

## 2015-10-01 DIAGNOSIS — R778 Other specified abnormalities of plasma proteins: Secondary | ICD-10-CM | POA: Diagnosis present

## 2015-10-01 DIAGNOSIS — W1830XA Fall on same level, unspecified, initial encounter: Secondary | ICD-10-CM | POA: Diagnosis not present

## 2015-10-01 DIAGNOSIS — I4891 Unspecified atrial fibrillation: Secondary | ICD-10-CM | POA: Diagnosis not present

## 2015-10-01 DIAGNOSIS — Y92239 Unspecified place in hospital as the place of occurrence of the external cause: Secondary | ICD-10-CM | POA: Diagnosis not present

## 2015-10-01 DIAGNOSIS — E1142 Type 2 diabetes mellitus with diabetic polyneuropathy: Secondary | ICD-10-CM

## 2015-10-01 DIAGNOSIS — E119 Type 2 diabetes mellitus without complications: Secondary | ICD-10-CM

## 2015-10-01 DIAGNOSIS — E870 Hyperosmolality and hypernatremia: Secondary | ICD-10-CM | POA: Diagnosis not present

## 2015-10-01 DIAGNOSIS — D709 Neutropenia, unspecified: Secondary | ICD-10-CM | POA: Diagnosis not present

## 2015-10-01 DIAGNOSIS — I248 Other forms of acute ischemic heart disease: Secondary | ICD-10-CM | POA: Diagnosis not present

## 2015-10-01 DIAGNOSIS — J440 Chronic obstructive pulmonary disease with acute lower respiratory infection: Secondary | ICD-10-CM | POA: Diagnosis not present

## 2015-10-01 DIAGNOSIS — Z87891 Personal history of nicotine dependence: Secondary | ICD-10-CM | POA: Diagnosis not present

## 2015-10-01 DIAGNOSIS — Y95 Nosocomial condition: Secondary | ICD-10-CM | POA: Diagnosis present

## 2015-10-01 DIAGNOSIS — M47816 Spondylosis without myelopathy or radiculopathy, lumbar region: Secondary | ICD-10-CM | POA: Diagnosis not present

## 2015-10-01 DIAGNOSIS — M353 Polymyalgia rheumatica: Secondary | ICD-10-CM | POA: Diagnosis present

## 2015-10-01 DIAGNOSIS — J189 Pneumonia, unspecified organism: Secondary | ICD-10-CM | POA: Diagnosis present

## 2015-10-01 DIAGNOSIS — Z9841 Cataract extraction status, right eye: Secondary | ICD-10-CM

## 2015-10-01 DIAGNOSIS — R2681 Unsteadiness on feet: Secondary | ICD-10-CM | POA: Diagnosis not present

## 2015-10-01 DIAGNOSIS — I130 Hypertensive heart and chronic kidney disease with heart failure and stage 1 through stage 4 chronic kidney disease, or unspecified chronic kidney disease: Secondary | ICD-10-CM | POA: Diagnosis not present

## 2015-10-01 DIAGNOSIS — N183 Chronic kidney disease, stage 3 (moderate): Secondary | ICD-10-CM | POA: Diagnosis present

## 2015-10-01 DIAGNOSIS — I481 Persistent atrial fibrillation: Secondary | ICD-10-CM | POA: Diagnosis present

## 2015-10-01 DIAGNOSIS — G0490 Encephalitis and encephalomyelitis, unspecified: Secondary | ICD-10-CM | POA: Diagnosis not present

## 2015-10-01 DIAGNOSIS — A419 Sepsis, unspecified organism: Secondary | ICD-10-CM | POA: Diagnosis not present

## 2015-10-01 DIAGNOSIS — F411 Generalized anxiety disorder: Secondary | ICD-10-CM | POA: Diagnosis not present

## 2015-10-01 DIAGNOSIS — Z452 Encounter for adjustment and management of vascular access device: Secondary | ICD-10-CM | POA: Diagnosis not present

## 2015-10-01 DIAGNOSIS — R1013 Epigastric pain: Secondary | ICD-10-CM | POA: Diagnosis not present

## 2015-10-01 DIAGNOSIS — R410 Disorientation, unspecified: Secondary | ICD-10-CM | POA: Diagnosis not present

## 2015-10-01 DIAGNOSIS — R1310 Dysphagia, unspecified: Secondary | ICD-10-CM | POA: Diagnosis not present

## 2015-10-01 DIAGNOSIS — H04129 Dry eye syndrome of unspecified lacrimal gland: Secondary | ICD-10-CM | POA: Diagnosis not present

## 2015-10-01 DIAGNOSIS — I2510 Atherosclerotic heart disease of native coronary artery without angina pectoris: Secondary | ICD-10-CM | POA: Diagnosis present

## 2015-10-01 DIAGNOSIS — Z79899 Other long term (current) drug therapy: Secondary | ICD-10-CM

## 2015-10-01 DIAGNOSIS — L02212 Cutaneous abscess of back [any part, except buttock]: Secondary | ICD-10-CM | POA: Diagnosis not present

## 2015-10-01 DIAGNOSIS — I10 Essential (primary) hypertension: Secondary | ICD-10-CM | POA: Diagnosis not present

## 2015-10-01 DIAGNOSIS — N189 Chronic kidney disease, unspecified: Secondary | ICD-10-CM | POA: Diagnosis not present

## 2015-10-01 DIAGNOSIS — K224 Dyskinesia of esophagus: Secondary | ICD-10-CM | POA: Diagnosis not present

## 2015-10-01 DIAGNOSIS — F419 Anxiety disorder, unspecified: Secondary | ICD-10-CM | POA: Diagnosis not present

## 2015-10-01 DIAGNOSIS — Z981 Arthrodesis status: Secondary | ICD-10-CM | POA: Diagnosis not present

## 2015-10-01 DIAGNOSIS — G9340 Encephalopathy, unspecified: Secondary | ICD-10-CM | POA: Diagnosis not present

## 2015-10-01 DIAGNOSIS — Z7982 Long term (current) use of aspirin: Secondary | ICD-10-CM

## 2015-10-01 DIAGNOSIS — M6281 Muscle weakness (generalized): Secondary | ICD-10-CM | POA: Diagnosis not present

## 2015-10-01 DIAGNOSIS — I5030 Unspecified diastolic (congestive) heart failure: Secondary | ICD-10-CM | POA: Diagnosis not present

## 2015-10-01 DIAGNOSIS — N390 Urinary tract infection, site not specified: Secondary | ICD-10-CM | POA: Diagnosis not present

## 2015-10-01 DIAGNOSIS — Z794 Long term (current) use of insulin: Secondary | ICD-10-CM

## 2015-10-01 DIAGNOSIS — G4733 Obstructive sleep apnea (adult) (pediatric): Secondary | ICD-10-CM | POA: Diagnosis present

## 2015-10-01 DIAGNOSIS — G9341 Metabolic encephalopathy: Secondary | ICD-10-CM | POA: Diagnosis present

## 2015-10-01 DIAGNOSIS — I959 Hypotension, unspecified: Secondary | ICD-10-CM

## 2015-10-01 DIAGNOSIS — D61818 Other pancytopenia: Secondary | ICD-10-CM | POA: Diagnosis not present

## 2015-10-01 DIAGNOSIS — Z888 Allergy status to other drugs, medicaments and biological substances status: Secondary | ICD-10-CM | POA: Diagnosis not present

## 2015-10-01 DIAGNOSIS — G4700 Insomnia, unspecified: Secondary | ICD-10-CM | POA: Diagnosis present

## 2015-10-01 DIAGNOSIS — L0291 Cutaneous abscess, unspecified: Secondary | ICD-10-CM

## 2015-10-01 DIAGNOSIS — R7989 Other specified abnormal findings of blood chemistry: Secondary | ICD-10-CM

## 2015-10-01 DIAGNOSIS — R32 Unspecified urinary incontinence: Secondary | ICD-10-CM | POA: Diagnosis not present

## 2015-10-01 DIAGNOSIS — E785 Hyperlipidemia, unspecified: Secondary | ICD-10-CM | POA: Diagnosis not present

## 2015-10-01 DIAGNOSIS — R2689 Other abnormalities of gait and mobility: Secondary | ICD-10-CM | POA: Diagnosis not present

## 2015-10-01 DIAGNOSIS — E1122 Type 2 diabetes mellitus with diabetic chronic kidney disease: Secondary | ICD-10-CM | POA: Diagnosis present

## 2015-10-01 DIAGNOSIS — Z961 Presence of intraocular lens: Secondary | ICD-10-CM | POA: Diagnosis present

## 2015-10-01 DIAGNOSIS — Z8249 Family history of ischemic heart disease and other diseases of the circulatory system: Secondary | ICD-10-CM

## 2015-10-01 DIAGNOSIS — I509 Heart failure, unspecified: Secondary | ICD-10-CM | POA: Diagnosis not present

## 2015-10-01 DIAGNOSIS — E1169 Type 2 diabetes mellitus with other specified complication: Secondary | ICD-10-CM

## 2015-10-01 DIAGNOSIS — I480 Paroxysmal atrial fibrillation: Secondary | ICD-10-CM | POA: Diagnosis not present

## 2015-10-01 DIAGNOSIS — R1314 Dysphagia, pharyngoesophageal phase: Secondary | ICD-10-CM | POA: Diagnosis not present

## 2015-10-01 DIAGNOSIS — N19 Unspecified kidney failure: Secondary | ICD-10-CM | POA: Diagnosis not present

## 2015-10-01 DIAGNOSIS — M4646 Discitis, unspecified, lumbar region: Secondary | ICD-10-CM | POA: Diagnosis present

## 2015-10-01 DIAGNOSIS — R4182 Altered mental status, unspecified: Secondary | ICD-10-CM | POA: Diagnosis not present

## 2015-10-01 DIAGNOSIS — J90 Pleural effusion, not elsewhere classified: Secondary | ICD-10-CM | POA: Diagnosis not present

## 2015-10-01 DIAGNOSIS — E11649 Type 2 diabetes mellitus with hypoglycemia without coma: Secondary | ICD-10-CM | POA: Diagnosis present

## 2015-10-01 DIAGNOSIS — K5900 Constipation, unspecified: Secondary | ICD-10-CM | POA: Diagnosis not present

## 2015-10-01 DIAGNOSIS — N400 Enlarged prostate without lower urinary tract symptoms: Secondary | ICD-10-CM | POA: Diagnosis present

## 2015-10-01 DIAGNOSIS — G061 Intraspinal abscess and granuloma: Secondary | ICD-10-CM | POA: Diagnosis not present

## 2015-10-01 DIAGNOSIS — E861 Hypovolemia: Secondary | ICD-10-CM | POA: Diagnosis present

## 2015-10-01 DIAGNOSIS — E860 Dehydration: Secondary | ICD-10-CM | POA: Diagnosis present

## 2015-10-01 DIAGNOSIS — R41841 Cognitive communication deficit: Secondary | ICD-10-CM | POA: Diagnosis not present

## 2015-10-01 DIAGNOSIS — F329 Major depressive disorder, single episode, unspecified: Secondary | ICD-10-CM | POA: Diagnosis present

## 2015-10-01 DIAGNOSIS — F09 Unspecified mental disorder due to known physiological condition: Secondary | ICD-10-CM | POA: Diagnosis not present

## 2015-10-01 DIAGNOSIS — Z09 Encounter for follow-up examination after completed treatment for conditions other than malignant neoplasm: Secondary | ICD-10-CM

## 2015-10-01 DIAGNOSIS — T368X5A Adverse effect of other systemic antibiotics, initial encounter: Secondary | ICD-10-CM | POA: Diagnosis present

## 2015-10-01 DIAGNOSIS — Z7952 Long term (current) use of systemic steroids: Secondary | ICD-10-CM

## 2015-10-01 LAB — I-STAT TROPONIN, ED: Troponin i, poc: 2.03 ng/mL (ref 0.00–0.08)

## 2015-10-01 LAB — CBC WITH DIFFERENTIAL/PLATELET
Basophils Absolute: 0 10*3/uL (ref 0.0–0.1)
Basophils Relative: 0 %
Eosinophils Absolute: 0 10*3/uL (ref 0.0–0.7)
Eosinophils Relative: 0 %
HCT: 31 % — ABNORMAL LOW (ref 39.0–52.0)
Hemoglobin: 10.1 g/dL — ABNORMAL LOW (ref 13.0–17.0)
Lymphocytes Relative: 44 %
Lymphs Abs: 0.7 10*3/uL (ref 0.7–4.0)
MCH: 29.5 pg (ref 26.0–34.0)
MCHC: 32.6 g/dL (ref 30.0–36.0)
MCV: 90.6 fL (ref 78.0–100.0)
Monocytes Absolute: 0.8 10*3/uL (ref 0.1–1.0)
Monocytes Relative: 47 %
Neutro Abs: 0.1 10*3/uL — ABNORMAL LOW (ref 1.7–7.7)
Neutrophils Relative %: 9 %
Platelets: 218 10*3/uL (ref 150–400)
RBC: 3.42 MIL/uL — ABNORMAL LOW (ref 4.22–5.81)
RDW: 15.6 % — ABNORMAL HIGH (ref 11.5–15.5)
WBC: 1.6 10*3/uL — ABNORMAL LOW (ref 4.0–10.5)

## 2015-10-01 LAB — COMPREHENSIVE METABOLIC PANEL
ALT: 14 U/L — ABNORMAL LOW (ref 17–63)
AST: 14 U/L — ABNORMAL LOW (ref 15–41)
Albumin: 2.5 g/dL — ABNORMAL LOW (ref 3.5–5.0)
Alkaline Phosphatase: 117 U/L (ref 38–126)
Anion gap: 9 (ref 5–15)
BUN: 32 mg/dL — ABNORMAL HIGH (ref 6–20)
CO2: 26 mmol/L (ref 22–32)
Calcium: 9.2 mg/dL (ref 8.9–10.3)
Chloride: 114 mmol/L — ABNORMAL HIGH (ref 101–111)
Creatinine, Ser: 2.33 mg/dL — ABNORMAL HIGH (ref 0.61–1.24)
GFR calc Af Amer: 30 mL/min — ABNORMAL LOW (ref >60)
GFR calc non Af Amer: 26 mL/min — ABNORMAL LOW (ref >60)
Glucose, Bld: 159 mg/dL — ABNORMAL HIGH (ref 65–99)
Potassium: 2.9 mmol/L — ABNORMAL LOW (ref 3.5–5.1)
Sodium: 149 mmol/L — ABNORMAL HIGH (ref 135–145)
Total Bilirubin: 1.7 mg/dL — ABNORMAL HIGH (ref 0.3–1.2)
Total Protein: 5.3 g/dL — ABNORMAL LOW (ref 6.5–8.1)

## 2015-10-01 LAB — MAGNESIUM: Magnesium: 1.7 mg/dL (ref 1.7–2.4)

## 2015-10-01 LAB — BRAIN NATRIURETIC PEPTIDE: B Natriuretic Peptide: 1601.7 pg/mL — ABNORMAL HIGH (ref 0.0–100.0)

## 2015-10-01 MED ORDER — DILTIAZEM HCL 25 MG/5ML IV SOLN
10.0000 mg | Freq: Once | INTRAVENOUS | Status: AC
  Administered 2015-10-01: 10 mg via INTRAVENOUS
  Filled 2015-10-01: qty 5

## 2015-10-01 MED ORDER — POTASSIUM CHLORIDE 10 MEQ/100ML IV SOLN
10.0000 meq | Freq: Once | INTRAVENOUS | Status: AC
  Administered 2015-10-02: 10 meq via INTRAVENOUS
  Filled 2015-10-01: qty 100

## 2015-10-01 MED ORDER — SODIUM CHLORIDE 0.9 % IV BOLUS (SEPSIS)
1000.0000 mL | Freq: Once | INTRAVENOUS | Status: AC
  Administered 2015-10-01: 1000 mL via INTRAVENOUS

## 2015-10-01 MED ORDER — VANCOMYCIN HCL IN DEXTROSE 1-5 GM/200ML-% IV SOLN
1000.0000 mg | Freq: Once | INTRAVENOUS | Status: AC
  Administered 2015-10-01: 1000 mg via INTRAVENOUS
  Filled 2015-10-01: qty 200

## 2015-10-01 MED ORDER — SODIUM CHLORIDE 0.9 % IV BOLUS (SEPSIS)
1000.0000 mL | Freq: Once | INTRAVENOUS | Status: AC
  Administered 2015-10-02: 1000 mL via INTRAVENOUS

## 2015-10-01 MED ORDER — PIPERACILLIN-TAZOBACTAM 3.375 G IVPB
3.3750 g | Freq: Once | INTRAVENOUS | Status: AC
  Administered 2015-10-01: 3.375 g via INTRAVENOUS
  Filled 2015-10-01: qty 50

## 2015-10-01 NOTE — ED Provider Notes (Signed)
CSN: [id number]     Arrival date & time 10/01/15  2119 History   First MD Initiated Contact with Patient 10/01/15 2124     Chief Complaint  Patient presents with  . Atrial Fibrillation  . Fever     (Consider location/radiation/quality/duration/timing/severity/associated sxs/prior Treatment) HPI Comments: Patient is a 75 year old male with past medical history of coronary artery disease with CABG, diabetes, COPD, hypertension. He was recently hospitalized after undergoing back surgery. He was felt to have developed a discitis which was treated with a prolonged series of antibiotics through a PICC line. Patient was then sent to a rehabilitation facility. According to the granddaughter, she went to check on him this evening and found him to be more confused. She reports that he has not eaten or drank anything in several days and reports that he has "given up". The patient is unable to add any useful history due to confusion and severity of illness.  Patient is a 75 y.o. male presenting with atrial fibrillation and fever. The history is provided by the patient.  Atrial Fibrillation This is a new problem. The current episode started 3 to 5 hours ago. The problem occurs constantly. The problem has not changed since onset.Nothing aggravates the symptoms. Nothing relieves the symptoms.  Fever   Past Medical History  Diagnosis Date  . Allergy   . Asthma   . Diabetes mellitus without complication ([place])   . Cataract   . Arthritis   . Polymyalgia rheumatica (HCC)     maintained on Prednisone, Plaquenil. Followed by rhuematology every 4 months/[name].  Marland Kitchen COPD (chronic obstructive pulmonary disease) ([place])   . Anemia   . Hypertension   . BPH (benign prostatic hyperplasia)   . Sleep apnea     CPAP   Trying to use  . Shortness of breath dyspnea     with exertion  . Anxiety   . Chronic kidney disease     chronic  kidney failure  kidney function at 42%  . Coronary artery disease     CABG  7  bypasses  . Hepatitis     many years ago  . Cancer of kidney [facility])    Past Surgical History  Procedure Laterality Date  . Appendectomy    . Hernia repair    . Prostate surgery      TURP at [facility].  Marland Kitchen Coronary artery bypass graft  03/17/1995    Wynonia Lawman; followed every six months.  . Cataract extraction w/phaco Right 11/28/2014    Procedure: CATARACT EXTRACTION PHACO AND INTRAOCULAR LENS PLACEMENT (IOC) RIGHT ;  Surgeon: Marylynn Pearson, MD;  Location: [place];  Service: Ophthalmology;  Laterality: Right;  . Lumbar laminectomy/decompression microdiscectomy N/A 07/30/2015    Procedure: LUMBAR LAMINECTOMY DISCECTOMY ;  Surgeon: Ashok Pall, MD;  Location: [place] [facility];  Service: Neurosurgery;  Laterality: N/A;  LUMBAR LAMINECTOMY DISCECTOMY   . Lumbar laminectomy/decompression microdiscectomy N/A 09/06/2015    Procedure: Redo L3/4 Disectomy;  Surgeon: Ashok Pall, MD;  Location: [place] [facility];  Service: Neurosurgery;  Laterality: N/A;   Family History  Problem Relation Age of Onset  . Hypertension Other    Social History  Substance Use Topics  . Smoking status: Former Smoker    Quit date: 03/16/1957  . Smokeless tobacco: Never Used  . Alcohol Use: No    Review of Systems  Unable to perform ROS: Acuity of condition  Constitutional: Positive for fever.      Allergies  Ace inhibitors; Actonel; Ciprocinonide; Flunisolide; Metformin  and related; Sertraline; Sulindac; and Terazosin  Home Medications   Prior to Admission medications   Medication Sig Start Date End Date Taking? Authorizing Provider  acetaminophen (TYLENOL) 325 MG tablet Take 2 tablets (650 mg total) by mouth every 6 (six) hours as needed for mild pain (or Fever >/= 101). 08/09/15   Silver Huguenin Elgergawy, MD  acidophilus (RISAQUAD) CAPS capsule Take 2 capsules by mouth daily. 08/09/15   Silver Huguenin Elgergawy, MD  albuterol (PROVENTIL HFA;VENTOLIN HFA) 108 (90 BASE) MCG/ACT inhaler Inhale 2 puffs into the lungs every 6 (six) hours as  needed for wheezing or shortness of breath. 07/16/13   Wardell Honour, MD  allopurinol (ZYLOPRIM) 300 MG tablet Take 300 mg by mouth daily with lunch.    Historical Provider, MD  amLODipine (NORVASC) 10 MG tablet Take 10 mg by mouth daily with supper.     Historical Provider, MD  aspirin EC 81 MG tablet Take 81 mg by mouth daily with lunch.    Historical Provider, MD  atorvastatin (LIPITOR) 20 MG tablet Take 1 tablet (20 mg total) by mouth daily at 6 PM. 09/08/15   Orson Eva, MD  Brinzolamide-Brimonidine [facility]) 1-0.2 % SUSP Place 1 drop into both eyes 2 (two) times daily.     Historical Provider, MD  budesonide-formoterol (SYMBICORT) 160-4.5 MCG/ACT inhaler Inhale 2 puffs into the lungs 2 (two) times daily.    Historical Provider, MD  calcitRIOL (ROCALTROL) 0.25 MCG capsule Take 0.25 mcg by mouth every Monday, Wednesday, and Friday.    Historical Provider, MD  cefTRIAXone 2 g in dextrose 5 % 50 mL Inject 2 g into the vein daily. Last day antibiotics on 09/26/15 09/09/15   Orson Eva, MD  cycloSPORINE (RESTASIS) 0.05 % ophthalmic emulsion Place 1 drop into both eyes 2 (two) times daily.    Historical Provider, MD  feeding supplement, GLUCERNA SHAKE, (GLUCERNA SHAKE) LIQD Take 237 mLs by mouth 3 (three) times daily between meals. 08/09/15   Silver Huguenin Elgergawy, MD  finasteride (PROSCAR) 5 MG tablet Take 5 mg by mouth at bedtime.     Historical Provider, MD  insulin NPH-regular Human (NOVOLIN 70/30) (70-30) 100 UNIT/ML injection Inject 20 Units into the skin 2 (two) times daily with a meal. 09/09/15   Orson Eva, MD  ipratropium-albuterol (DUONEB) 0.5-2.5 (3) MG/3ML SOLN Take 3 mLs by nebulization every 8 (eight) hours. Reported on 03/27/2015    Historical Provider, MD  Liraglutide (VICTOZA) 18 MG/3ML SOPN Inject 1.2 mg into the skin daily at 3 pm.     Historical Provider, MD  metoprolol (LOPRESSOR) 100 MG tablet Take 1 tablet (100 mg total) by mouth 2 (two) times daily. 09/08/15   Orson Eva, MD  montelukast  (SINGULAIR) 10 MG tablet Take 1 tablet (10 mg total) by mouth at bedtime. 07/27/13   Wardell Honour, MD  Multiple Vitamins-Minerals (PRESERVISION AREDS 2 PO) Take 1 tablet by mouth 2 (two) times daily.     Historical Provider, MD  oxyCODONE (OXY IR/ROXICODONE) 5 MG immediate release tablet Take 1 tablet (5 mg total) by mouth every 4 (four) hours as needed for severe pain. 09/08/15   Orson Eva, MD  pilocarpine (PILOCAR) 2 % ophthalmic solution Place 1 drop into both eyes 3 (three) times daily.    Historical Provider, MD  polyethylene glycol (MIRALAX / GLYCOLAX) packet Take 17 g by mouth 2 (two) times daily.    Historical Provider, MD  predniSONE (DELTASONE) 1 MG tablet Take 2 mg by mouth daily  with breakfast. IN CONJUNCTION WITH ONE 5 MG TABLET TO EQUAL A TOTAL OF 7 MILLIGRAMS    Historical Provider, MD  predniSONE (DELTASONE) 5 MG tablet Take 5 mg by mouth daily with breakfast. IN CONJUNCTION WITH TWO 1 MG TABLETS TO EQUAL A TOTAL OF 7 MILLIGRAMS    Historical Provider, MD  sennosides-docusate sodium (SENOKOT-S) 8.6-50 MG tablet Take 2 tablets by mouth 2 (two) times daily.    Historical Provider, MD  sodium chloride 0.9 % nebulizer solution Take 3 mLs by nebulization every 6 (six) hours as needed for wheezing.    Historical Provider, MD  Tamsulosin HCl (FLOMAX) 0.4 MG CAPS Take 0.4 mg by mouth at bedtime.     Historical Provider, MD  traMADol (ULTRAM) 50 MG tablet Take 1 tablet (50 mg total) by mouth every 6 (six) hours as needed for moderate pain. 09/08/15   Orson Eva, MD  traZODone (DESYREL) 100 MG tablet Take 1 tablet (100 mg total) by mouth at bedtime. 09/08/15   Orson Eva, MD  vancomycin 500 mg in sodium chloride 0.9 % 100 mL Inject 500 mg into the vein every 12 (twelve) hours. Last day antibiotic on 09/26/15 09/09/15   Orson Eva, MD   There were no vitals taken for this visit. Physical Exam  Constitutional: He appears well-developed and well-nourished. No distress.  HENT:  Head: Normocephalic and  atraumatic.  Mouth/Throat: Oropharynx is clear and moist.  Neck: Normal range of motion. Neck supple.  Cardiovascular:  No murmur heard. Heart is irregularly irregular and rapid.  Pulmonary/Chest: Effort normal and breath sounds normal. No respiratory distress. He has no wheezes. He has no rales.  Abdominal: Soft. Bowel sounds are normal. He exhibits no distension. There is no tenderness.  Musculoskeletal: Normal range of motion. He exhibits no edema.  Neurological: Coordination normal.  Patient is awake and alert. He seems disoriented and confused. His speech is difficult to comprehend. He does appear to move all extremities with purpose. The remainder the neurologic exam is difficult to carry out secondary to acuity of condition.  Skin: Skin is warm and dry. He is not diaphoretic.  Nursing note and vitals reviewed.   ED Course  Procedures (including critical care time) Labs Review Labs Reviewed  COMPREHENSIVE METABOLIC PANEL  CBC WITH DIFFERENTIAL/PLATELET  BRAIN NATRIURETIC PEPTIDE  URINALYSIS, ROUTINE W REFLEX MICROSCOPIC (NOT AT [facility])  I-STAT TROPOININ, ED    Imaging Review No results found. I have personally reviewed and evaluated these images and lab results as part of my medical decision-making.   EKG Interpretation   Date/Time:  Tuesday October 01 2015 21:24:15 EDT Ventricular Rate:  168 PR Interval:    QRS Duration: 97 QT Interval:  267 QTC Calculation: 447 R Axis:   11 Text Interpretation:  Atrial fibrillation with rapid V-rate Repolarization  abnormality, prob rate related Baseline wander in lead(s) V4 Confirmed by  Koree Schopf  MD, Ragna Kramlich ([id number]) on 10/01/2015 9:33:51 PM      MDM   Final diagnoses:  None    Patient presents for evaluation of fever and elevated heart rate. He is currently a resident of an extended care facility for rehabilitation following back surgery with the complications of discitis requiring prolonged IV antibiotics. According to the  patient's granddaughter, he has not been eating or drinking and his laboratory studies would seem to suggest this. He is showing acute renal failure and appears clinically dehydrated. He was given normal saline without much improvement in his heart rate. He will be given  additional bolus. If his rate does not improve, he will be given Cardizem for rate control.  He is also found to have an elevated troponin. I am uncertain as to the significance of this as his renal function is up and there is likely a component of demand ischemia due to his elevated heart rate.  He was also found to have what appears to be an infiltrate on the chest x-ray. He will be given Vanco and Zosyn for HCAP.  He will be admitted to the hospitalist service under the care of Dr. Tamala Julian. He has asked that I speak with cardiology. I've spoken with the cardiology fellow Dr. Jeannine Boga who see and evaluate the patient.  CRITICAL CARE Performed by: Veryl Speak Total critical care time: 30 minutes Critical care time was exclusive of separately billable procedures and treating other patients. Critical care was necessary to treat or prevent imminent or life-threatening deterioration. Critical care was time spent personally by me on the following activities: development of treatment plan with patient and/or surrogate as well as nursing, discussions with consultants, evaluation of patient's response to treatment, examination of patient, obtaining history from patient or surrogate, ordering and performing treatments and interventions, ordering and review of laboratory studies, ordering and review of radiographic studies, pulse oximetry and re-evaluation of patient's condition.     Veryl Speak, MD 10/01/15 [id number]

## 2015-10-01 NOTE — ED Notes (Signed)
Pt from [facility]. Pt has a-fib with rate between 70-170 and pt had a fever as of today according to facility of 104.0 pt was given 650 of tylenol prior to coming.

## 2015-10-01 NOTE — H&P (Addendum)
History and Physical    Jacob [name] [id number] DOB: May 14, 1940 DOA: 10/01/2015  Referring MD/NP/PA: Stark Jock PCP: No primary care provider on file.  Patient coming from: [name]  Chief Complaint: Altered mental status  HPI: Jacob [name] is a 75 y.o. male with medical history significant of CAD, CABG, IDDM, COPD, dyslipidemia, RCC, PMR, HTN, CKD, OSA , lumbar stenosis s/p correction, and recently was hospitalized with discitis for which a PICC line was placed for IV antibiotics of vancomycin that he received a [facility] facility; who presented with complaints of being found acutely altered. Patient is unable to provide adequate history and most of history is obtained from the patient's family members who are present at bedside. Patient had chest completed his IV antibiotic course approximately 2 days ago. His granddaughter went to check on him this afternoon at the nursing facility and reported that he was more confused and uterosacral and had not E Nord drink anything in several days. Patient was noted to be in atrial fibrillation with a heart rate of up to 170 with fever documented as high as 104F. Occasional complaint of cramps in his leg and lower back pain. Not been any significant complaints of shortness of breath or chest pain.   ED Course: Upon admission to department patient was evaluated and seen to be febrile with a temperature of 102.52F, heart rates irregular in the 150s to 160s,  blood pressures as low as 83/67, and 2 saturations maintained on room air currently. Initial lab work revealed WBC 1.6, hemoglobin 10.1, platelets 218, sodium 149, potassium 2.9, chloride 114, CO2 26, BUN 32, creatinine 2.33, calcium 9.2, magnesium 1.7, BNP 1601.7, and troponin of 2.03. Patient found to have signs of a left lower lobe pneumonia on chest x-ray and urinalysis was positive for yeast, small leukocytes, 6-30 WBCs, and few bacteria. Sepsis protocol was initiated and  the patient received the recommended fluid bolus and was started on antibiotics of vancomycin and Zosyn. He also received 20 mEq of potassium chloride while in the ED. Cardiology and PCCM evaluated the patient. He was to resume amiodarone drip and it was recommended that TRH admitted to step down.  Review of Systems: As per HPI otherwise 10 point review of systems negative.   Past Medical History  Diagnosis Date  . Allergy   . Asthma   . Diabetes mellitus without complication ([place])   . Cataract   . Arthritis   . Polymyalgia rheumatica (HCC)     maintained on Prednisone, Plaquenil. Followed by rhuematology every 4 months/[name].  Marland Kitchen COPD (chronic obstructive pulmonary disease) ([place])   . Anemia   . Hypertension   . BPH (benign prostatic hyperplasia)   . Sleep apnea     CPAP   Trying to use  . Shortness of breath dyspnea     with exertion  . Anxiety   . Chronic kidney disease     chronic  kidney failure  kidney function at 42%  . Coronary artery disease     CABG  7 bypasses  . Hepatitis     many years ago  . Cancer of kidney [facility])     Past Surgical History  Procedure Laterality Date  . Appendectomy    . Hernia repair    . Prostate surgery      TURP at [facility].  Marland Kitchen Coronary artery bypass graft  03/17/1995    Wynonia Lawman; followed every six months.  . Cataract extraction w/phaco Right 11/28/2014  Procedure: CATARACT EXTRACTION PHACO AND INTRAOCULAR LENS PLACEMENT (IOC) RIGHT ;  Surgeon: Marylynn Pearson, MD;  Location: [place];  Service: Ophthalmology;  Laterality: Right;  . Lumbar laminectomy/decompression microdiscectomy N/A 07/30/2015    Procedure: LUMBAR LAMINECTOMY DISCECTOMY ;  Surgeon: Ashok Pall, MD;  Location: [place] [facility]UR[facility];  Service: Neurosurgery;  Laterality: N/A;  LUMBAR LAMINECTOMY DISCECTOMY   . Lumbar laminectomy/decompression microdiscectomy N/A 09/06/2015    Procedure: Redo L3/4 Disectomy;  Surgeon: Ashok Pall, MD;  Location: [place] NEUR[facility] ORS;  Service: Neurosurgery;  Laterality:  N/A;     reports that he quit smoking about 58 years ago. He has never used smokeless tobacco. He reports that he does not drink alcohol or use illicit drugs.  Allergies  Allergen Reactions  . Ace Inhibitors Other (See Comments)    Probably nausea and vomiting per patient   . Actonel [Risedronate] Nausea And Vomiting  . Ciprocinonide [Fluocinolone] Other (See Comments)    Probably nausea and vomiting per patient  . Flunisolide Other (See Comments)    Probably nausea and vomiting per patient   . Metformin And Related Other (See Comments)    Probably nausea and vomiting per patient   . Sertraline Other (See Comments)    Probably nausea and vomiting per patient   . Sulindac Other (See Comments)    Probably nausea and vomiting per patient   . Terazosin Other (See Comments)    Probably nausea and vomiting per patient     Family History  Problem Relation Age of Onset  . Hypertension Other     Prior to Admission medications   Medication Sig Start Date End Date Taking? Authorizing Provider  albuterol (PROVENTIL HFA;VENTOLIN HFA) 108 (90 BASE) MCG/ACT inhaler Inhale 2 puffs into the lungs every 6 (six) hours as needed for wheezing or shortness of breath. 07/16/13  Yes Wardell Honour, MD  ergocalciferol (VITAMIN D2) 50000 units capsule Take 50,000 Units by mouth See admin instructions. Take one tablet by mouth twice a week on Saturday and Wednesday   Yes Historical Provider, MD  Insulin Detemir (LEVEMIR FLEXPEN) 100 UNIT/ML Pen Inject 10 Units into the skin daily at 10 pm.   Yes Historical Provider, MD  POTASSIUM PO Take 1 tablet by mouth daily. Dose: 40 MEQ   Yes Historical Provider, MD  acetaminophen (TYLENOL) 325 MG tablet Take 2 tablets (650 mg total) by mouth every 6 (six) hours as needed for mild pain (or Fever >/= 101). 08/09/15   Silver Huguenin Elgergawy, MD  acidophilus (RISAQUAD) CAPS capsule Take 2 capsules by mouth daily. 08/09/15   Silver Huguenin Elgergawy, MD  amLODipine (NORVASC) 10  MG tablet Take 10 mg by mouth daily with supper.     Historical Provider, MD  aspirin EC 81 MG tablet Take 81 mg by mouth daily with lunch.    Historical Provider, MD  atorvastatin (LIPITOR) 20 MG tablet Take 1 tablet (20 mg total) by mouth daily at 6 PM. 09/08/15   Orson Eva, MD  Brinzolamide-Brimonidine [facility]) 1-0.2 % SUSP Place 1 drop into both eyes 2 (two) times daily.     Historical Provider, MD  budesonide-formoterol (SYMBICORT) 160-4.5 MCG/ACT inhaler Inhale 2 puffs into the lungs 2 (two) times daily.    Historical Provider, MD  calcitRIOL (ROCALTROL) 0.25 MCG capsule Take 0.25 mcg by mouth every Monday, Wednesday, and Friday.    Historical Provider, MD  cefTRIAXone 2 g in dextrose 5 % 50 mL Inject 2 g into the vein daily. Last day antibiotics  on 09/26/15 09/09/15   Orson Eva, MD  cycloSPORINE (RESTASIS) 0.05 % ophthalmic emulsion Place 1 drop into both eyes 2 (two) times daily.    Historical Provider, MD  feeding supplement, GLUCERNA SHAKE, (GLUCERNA SHAKE) LIQD Take 237 mLs by mouth 3 (three) times daily between meals. 08/09/15   Silver Huguenin Elgergawy, MD  finasteride (PROSCAR) 5 MG tablet Take 5 mg by mouth at bedtime.     Historical Provider, MD  insulin NPH-regular Human (NOVOLIN 70/30) (70-30) 100 UNIT/ML injection Inject 20 Units into the skin 2 (two) times daily with a meal. 09/09/15   Orson Eva, MD  ipratropium-albuterol (DUONEB) 0.5-2.5 (3) MG/3ML SOLN Take 3 mLs by nebulization every 8 (eight) hours. Reported on 03/27/2015    Historical Provider, MD  Liraglutide (VICTOZA) 18 MG/3ML SOPN Inject 1.2 mg into the skin daily at 3 pm.     Historical Provider, MD  metoprolol (LOPRESSOR) 100 MG tablet Take 1 tablet (100 mg total) by mouth 2 (two) times daily. 09/08/15   Orson Eva, MD  montelukast (SINGULAIR) 10 MG tablet Take 1 tablet (10 mg total) by mouth at bedtime. 07/27/13   Wardell Honour, MD  Multiple Vitamins-Minerals (PRESERVISION AREDS 2 PO) Take 1 tablet by mouth 2 (two) times daily.      Historical Provider, MD  oxyCODONE (OXY IR/ROXICODONE) 5 MG immediate release tablet Take 1 tablet (5 mg total) by mouth every 4 (four) hours as needed for severe pain. 09/08/15   Orson Eva, MD  pilocarpine (PILOCAR) 2 % ophthalmic solution Place 1 drop into both eyes 3 (three) times daily.    Historical Provider, MD  polyethylene glycol (MIRALAX / GLYCOLAX) packet Take 17 g by mouth 2 (two) times daily.    Historical Provider, MD  predniSONE (DELTASONE) 1 MG tablet Take 2 mg by mouth daily with breakfast. IN CONJUNCTION WITH ONE 5 MG TABLET TO EQUAL A TOTAL OF 7 MILLIGRAMS    Historical Provider, MD  predniSONE (DELTASONE) 5 MG tablet Take 5 mg by mouth daily with breakfast. IN CONJUNCTION WITH TWO 1 MG TABLETS TO EQUAL A TOTAL OF 7 MILLIGRAMS    Historical Provider, MD  sennosides-docusate sodium (SENOKOT-S) 8.6-50 MG tablet Take 2 tablets by mouth 2 (two) times daily.    Historical Provider, MD  sodium chloride 0.9 % nebulizer solution Take 3 mLs by nebulization every 6 (six) hours as needed for wheezing.    Historical Provider, MD  Tamsulosin HCl (FLOMAX) 0.4 MG CAPS Take 0.4 mg by mouth at bedtime.     Historical Provider, MD  traMADol (ULTRAM) 50 MG tablet Take 1 tablet (50 mg total) by mouth every 6 (six) hours as needed for moderate pain. 09/08/15   Orson Eva, MD  traZODone (DESYREL) 100 MG tablet Take 1 tablet (100 mg total) by mouth at bedtime. 09/08/15   Orson Eva, MD  vancomycin 500 mg in sodium chloride 0.9 % 100 mL Inject 500 mg into the vein every 12 (twelve) hours. Last day antibiotic on 09/26/15 09/09/15   Orson Eva, MD    Physical Exam:   Constitutional: NAD, calm, comfortable Filed Vitals:   10/01/15 2202 10/01/15 2215 10/01/15 2235  BP: 141/94 118/63   Pulse: 168 99   Temp:   102.9 F (39.4 C)  TempSrc:   Rectal  Resp:  39   SpO2:  97%    Eyes: PERRL, lids and conjunctivae normal ENMT: Mucous membranes are moist. Posterior pharynx clear of any exudate or lesions.Normal  dentition.  Neck: normal, supple, no masses, no thyromegaly Respiratory: clear to auscultation bilaterally, no wheezing, no crackles. Normal respiratory effort. No accessory muscle use.  Cardiovascular: Regular rate and rhythm, no murmurs / rubs / gallops. No extremity edema. 2+ pedal pulses. No carotid bruits.  Abdomen: no tenderness, no masses palpated. No hepatosplenomegaly. Bowel sounds positive.  Musculoskeletal: no clubbing / cyanosis. No joint deformity upper and lower extremities. Good ROM, no contractures. Normal muscle tone.  Skin: no rashes, lesions, ulcers. No induration Neurologic: CN 2-12 grossly intact. Sensation intact, DTR normal. Strength 5/5 in all 4.  Psychiatric: Normal judgment and insight. Alert and oriented x 3. Normal mood.     Labs on Admission: I have personally reviewed following labs and imaging studies  CBC:  Recent Labs Lab 10/01/15 2145  WBC 1.6*  NEUTROABS 0.1*  HGB 10.1*  HCT 31.0*  MCV 90.6  PLT 99991111   Basic Metabolic Panel:  Recent Labs Lab 10/01/15 2145  NA 149*  K 2.9*  CL 114*  CO2 26  GLUCOSE 159*  BUN 32*  CREATININE 2.33*  CALCIUM 9.2   GFR: CrCl cannot be calculated (Unknown ideal weight.). Liver Function Tests:  Recent Labs Lab 10/01/15 2145  AST 14*  ALT 14*  ALKPHOS 117  BILITOT 1.7*  PROT 5.3*  ALBUMIN 2.5*   No results for input(s): LIPASE, AMYLASE in the last 168 hours. No results for input(s): AMMONIA in the last 168 hours. Coagulation Profile: No results for input(s): INR, PROTIME in the last 168 hours. Cardiac Enzymes: No results for input(s): CKTOTAL, CKMB, CKMBINDEX, TROPONINI in the last 168 hours. BNP (last 3 results) No results for input(s): PROBNP in the last 8760 hours. HbA1C: No results for input(s): HGBA1C in the last 72 hours. CBG: No results for input(s): GLUCAP in the last 168 hours. Lipid Profile: No results for input(s): CHOL, HDL, LDLCALC, TRIG, CHOLHDL, LDLDIRECT in the last 72  hours. Thyroid Function Tests: No results for input(s): TSH, T4TOTAL, FREET4, T3FREE, THYROIDAB in the last 72 hours. Anemia Panel: No results for input(s): VITAMINB12, FOLATE, FERRITIN, TIBC, IRON, RETICCTPCT in the last 72 hours. Urine analysis:    Component Value Date/Time   COLORURINE YELLOW 09/05/2015 2048   APPEARANCEUR CLOUDY* 09/05/2015 2048   LABSPEC 1.015 09/05/2015 2048   PHURINE 7.0 09/05/2015 2048   GLUCOSEU NEGATIVE 09/05/2015 2048   HGBUR NEGATIVE 09/05/2015 2048   BILIRUBINUR NEGATIVE 09/05/2015 2048   BILIRUBINUR neg 09/09/2013 1233   KETONESUR NEGATIVE 09/05/2015 2048   PROTEINUR >300* 09/05/2015 2048   PROTEINUR >=300 09/09/2013 1233   UROBILINOGEN 0.2 09/09/2013 1233   NITRITE NEGATIVE 09/05/2015 2048   NITRITE neg 09/09/2013 1233   LEUKOCYTESUR TRACE* 09/05/2015 2048   Sepsis Labs: No results found for this or any previous visit (from the past 240 hour(s)).   Radiological Exams on Admission: Dg Chest Port 1 View  10/01/2015  CLINICAL DATA:  Fever EXAM: PORTABLE CHEST 1 VIEW COMPARISON:  Aug 05, 2015 FINDINGS: There is airspace consolidation throughout the left lower lobe with small left effusion. There is a small calcified granuloma in the right base. There is slight atelectasis in the right base. There is cardiomegaly pulmonary vascularity within normal limits. There is atherosclerotic calcification in the aorta. Patient is status post coronary artery bypass grafting. No pneumothorax. No adenopathy. No apparent bone lesions. IMPRESSION: Left lower lobe airspace consolidation consistent with pneumonia. Small left effusion. Mild right base atelectasis. Calcified granuloma right base. Stable cardiac prominence. There is aortic atherosclerosis. Followup PA  and lateral chest radiographs recommended in 3-4 weeks following trial of antibiotic therapy to ensure resolution and exclude underlying malignancy. Electronically Signed   By: Lowella Grip III M.D.   On:  10/01/2015 21:53    EKG: Independently reviewed. New   Assessment/Plan Sepsis: Acute. Patient presents with acute confusion found to have fever of 102.58F, tachycardia, tachypnea, and WBC 1.6. Lactic acid obtained after initial therapies noted to be 1.5.Sepsis protocol initiated patient stated on broad spectrum antibiotics and given at least 3 L of saline IV fluids in the ED. - Admit to stepdown - Follow-up blood and urine cultures  - Empiric antibiotics of vancomycin and Zosyn - Tylenol prn fever - Will need to consider MRI of the lumbar spine once patient more stable given patient's recent history of discitis is completing antibiotics 2 days ago  Acute encephalopathy: Suspect secondary to acute infection - Continue to monitor  Atrial fibrillation: Acute. Patient with a fibrillation with RVR heart rates seen as high as 140-160. Suspect symptoms related to acute infection. - [place] cardiology consultation. Patient sees Dr. Chalmers Cater as a outpatient.   - Amiodarone drip - check TSH - replacing electrolytes. Goal magnesium 2 and potassium 4 - deferring systemic anticoagulation therapy at this time due to recent spinal surgery  Hypotension:Acute. Blood pressure seen as low as 83/62 in the setting of atrial fibrillation with RVR. - IV fluids as tolerated, consider need of IV pressors if Blood pressures do not improve with improvement of heart rates - Hold blood pressure medications of amlodipine  - Given 1 dose of Cortef 100 mg IV 1 dose now with history of steriod use  Elevated troponins: Patient's initial troponin noted be 2.03 on admission suspect secondary to demand ischemia consider patient presenting in atrial fibrillation with RVR. - Continue trending cardiac troponin   Hypokalemia:  Potassium 2.8 on admission. Patient was given 20 meq potassium chloride while in the ED. - Potassium chloride 50 mEq IV  - Recheck BMP in a.m. and replace as needed   Acute kidney injury on  chronic kidney disease: Baseline kidney function have been previously seen round 1.17-1.41. Car on presentation creatinine 2.33 with a BUN of 32. Based off lab work appears to be prerenal in nature.  - IV fluids as tolerated - Place foley cathether for accurate ins and outs  Diastolic dysfunction: Last EF noted to be 55-60% with grade 1 diastolic dysfunction and [id number]. - Strict intake and output  Diabetes mellitus type 2 - Hypoglycemic protocols - CBGs every 4 hours sensitive sliding scale insulin for now -  Restart home regimen when able  Yeast present in urine: UA positive for yeast. - f/u urine culture - Fluconazole 1 dose now   Pancytopenia: WBC 1.6 and hemoglobin 10.1 - repeat CBC in a.m.  Hypernatremia: Sodium 149 on admission. Suspect secondary to significant dehydration. - Follow-up repeat BMP  BPH - Continue Flomax and finasteride - Place Foley catheter in the acute setting of severe illness  DVT prophylaxis: Heparin   Code Status:  Paritial no feeding tubes, no intubation, no shock, no pressures, okay for trial of CPR, ok for CPAP/BiPAP Family Communication: discussed plan with the patient wife over the phone. Disposition Plan:   undetermined Consults called: PCCM and Cardiology Admission status:  Inpatient stepdown  Norval Morton MD [facility] Pager [phone]  If 7PM-7AM, please contact night-coverage [email or web address] Password [facility]  10/01/2015, 10:48 PM

## 2015-10-01 NOTE — Telephone Encounter (Signed)
Jacob [name] 11-08-2040. His wife called regarding the referral she was to set up. They are unable to get him in until 01/03/16. She feels he needs to be sooner due to his mental state and he is losing weight. His number is [phone]. Thank you

## 2015-10-01 NOTE — Telephone Encounter (Signed)
I don't think they will get in any sooner anywhere else, this is a common issue recently with all Psychiatry offices.  Please advise is there is anything you would like for me to do.

## 2015-10-01 NOTE — Consult Note (Signed)
CARDIOLOGY CONSULT NOTE   Patient ID: Jacob [name] MRN: [name] DOB/AGE: 1941/03/01 75 y.o.  Admit date: 10/01/2015  Requesting Physician: Primary Physician:   No primary care provider on file. Primary Cardiologist:   Wynonia Lawman Reason for Consultation:   Afib with RVR, elevated troponin  HPI: Jacob [name] is a 75 y.o. male with a history of CAD s/p CABG, IDDM, dyslipidemia, HTN, OSA, and a recent complicated hospital course 2/2 discitis with a prolonged course of antibiotics and currently residing at a SNF.  He was transferred from that facility to the [facility] ED given his family's concern for AMS and generalized malaise fatigue, and per report the pt was also noted to be febrile prior to transfer.  In the ED the pt was noted to be in atrial fibrillation with a ventricular rate in the 150s, and at the time of my examination his SBP is in the low 100s.  He is only oriented to himself and unable to provide a history, but his wife and children were present at bedside.  They report that Jacob [name] has "not been himself" since his recent hospitalization, notably confused and with poor appetite.  His daughter also reports that he did experience afib with a rapid ventricular rate during his recent hospitalization.  To the best of their knowledge he has not complained of chest pain or dyspnea over recent days.  They are also unaware of any palpitations, sensation of a racing heart, or presyncopal events.  Jacob [name] is currently laying supine, looks acutely ill with mild diaphoresis and c/o back pain.   Past Medical History  Diagnosis Date  . Allergy   . Asthma   . Diabetes mellitus without complication ([place])   . Cataract   . Arthritis   . Polymyalgia rheumatica (HCC)     maintained on Prednisone, Plaquenil. Followed by rhuematology every 4 months/[name].  Marland Kitchen COPD (chronic obstructive pulmonary disease) ([place])   . Anemia   . Hypertension   . BPH (benign prostatic hyperplasia)   . Sleep  apnea     CPAP   Trying to use  . Shortness of breath dyspnea     with exertion  . Anxiety   . Chronic kidney disease     chronic  kidney failure  kidney function at 42%  . Coronary artery disease     CABG  7 bypasses  . Hepatitis     many years ago  . Cancer of kidney [facility])      Past Surgical History  Procedure Laterality Date  . Appendectomy    . Hernia repair    . Prostate surgery      TURP at [facility].  Marland Kitchen Coronary artery bypass graft  03/17/1995    Wynonia Lawman; followed every six months.  . Cataract extraction w/phaco Right 11/28/2014    Procedure: CATARACT EXTRACTION PHACO AND INTRAOCULAR LENS PLACEMENT (IOC) RIGHT ;  Surgeon: Marylynn Pearson, MD;  Location: [place];  Service: Ophthalmology;  Laterality: Right;  . Lumbar laminectomy/decompression microdiscectomy N/A 07/30/2015    Procedure: LUMBAR LAMINECTOMY DISCECTOMY ;  Surgeon: Ashok Pall, MD;  Location: [place] NEURO ORS;  Service: Neurosurgery;  Laterality: N/A;  LUMBAR LAMINECTOMY DISCECTOMY   . Lumbar laminectomy/decompression microdiscectomy N/A 09/06/2015    Procedure: Redo L3/4 Disectomy;  Surgeon: Ashok Pall, MD;  Location: [place] NEURO ORS;  Service: Neurosurgery;  Laterality: N/A;    Allergies  Allergen Reactions  . Ace Inhibitors Other (See Comments)    Probably  nausea and vomiting per patient   . Actonel [Risedronate] Nausea And Vomiting  . Ciprocinonide [Fluocinolone] Other (See Comments)    Probably nausea and vomiting per patient  . Flunisolide Other (See Comments)    Probably nausea and vomiting per patient   . Metformin And Related Other (See Comments)    Probably nausea and vomiting per patient   . Sertraline Other (See Comments)    Probably nausea and vomiting per patient   . Sulindac Other (See Comments)    Probably nausea and vomiting per patient   . Terazosin Other (See Comments)    Probably nausea and vomiting per patient     I have reviewed the patient's current medications . diltiazem  10 mg Intravenous  Once   . piperacillin-tazobactam (ZOSYN)  IV 3.375 g (10/01/15 2242)  . potassium chloride    . potassium chloride    . sodium chloride    . vancomycin 1,000 mg (10/01/15 2244)     Prior to Admission medications   Medication Sig Start Date End Date Taking? Authorizing Provider  acetaminophen (TYLENOL) 325 MG tablet Take 2 tablets (650 mg total) by mouth every 6 (six) hours as needed for mild pain (or Fever >/= 101). 08/09/15  Yes Albertine Patricia, MD  albuterol (PROVENTIL HFA;VENTOLIN HFA) 108 (90 BASE) MCG/ACT inhaler Inhale 2 puffs into the lungs every 6 (six) hours as needed for wheezing or shortness of breath. 07/16/13  Yes Wardell Honour, MD  amLODipine (NORVASC) 10 MG tablet Take 10 mg by mouth daily with supper.    Yes Historical Provider, MD  aspirin EC 81 MG tablet Take 81 mg by mouth daily with lunch.   Yes Historical Provider, MD  atorvastatin (LIPITOR) 20 MG tablet Take 1 tablet (20 mg total) by mouth daily at 6 PM. 09/08/15  Yes Orson Eva, MD  Brinzolamide-Brimonidine [facility]) 1-0.2 % SUSP Place 1 drop into both eyes 2 (two) times daily.    Yes Historical Provider, MD  budesonide-formoterol (SYMBICORT) 160-4.5 MCG/ACT inhaler Inhale 2 puffs into the lungs 2 (two) times daily.   Yes Historical Provider, MD  calcitRIOL (ROCALTROL) 0.25 MCG capsule Take 0.25 mcg by mouth every Monday, Wednesday, and Friday.   Yes Historical Provider, MD  cycloSPORINE (RESTASIS) 0.05 % ophthalmic emulsion Place 1 drop into both eyes 2 (two) times daily.   Yes Historical Provider, MD  ergocalciferol (VITAMIN D2) 50000 units capsule Take 50,000 Units by mouth See admin instructions. Take one tablet by mouth twice a week on Saturday and Wednesday   Yes Historical Provider, MD  feeding supplement, GLUCERNA SHAKE, (GLUCERNA SHAKE) LIQD Take 237 mLs by mouth 3 (three) times daily between meals. 08/09/15  Yes Silver Huguenin Elgergawy, MD  finasteride (PROSCAR) 5 MG tablet Take 5 mg by mouth at bedtime.    Yes  Historical Provider, MD  Insulin Detemir (LEVEMIR FLEXPEN) 100 UNIT/ML Pen Inject 10 Units into the skin daily at 10 pm.   Yes Historical Provider, MD  ipratropium-albuterol (DUONEB) 0.5-2.5 (3) MG/3ML SOLN Take 3 mLs by nebulization every 8 (eight) hours. Reported on 03/27/2015   Yes Historical Provider, MD  metoprolol (LOPRESSOR) 100 MG tablet Take 1 tablet (100 mg total) by mouth 2 (two) times daily. 09/08/15  Yes Orson Eva, MD  montelukast (SINGULAIR) 10 MG tablet Take 1 tablet (10 mg total) by mouth at bedtime. 07/27/13  Yes Wardell Honour, MD  Multiple Vitamins-Minerals (PRESERVISION AREDS 2 PO) Take 1 tablet by mouth 2 (two) times daily.  Yes Historical Provider, MD  oxyCODONE (OXY IR/ROXICODONE) 5 MG immediate release tablet Take 1 tablet (5 mg total) by mouth every 4 (four) hours as needed for severe pain. 09/08/15  Yes Orson Eva, MD  pilocarpine (PILOCAR) 2 % ophthalmic solution Place 1 drop into both eyes 3 (three) times daily.   Yes Historical Provider, MD  polyethylene glycol (MIRALAX / GLYCOLAX) packet Take 17 g by mouth 2 (two) times daily.   Yes Historical Provider, MD  POTASSIUM PO Take 1 tablet by mouth daily. Dose: 40 MEQ   Yes Historical Provider, MD  predniSONE (DELTASONE) 1 MG tablet Take 2 mg by mouth daily with breakfast. IN CONJUNCTION WITH ONE 5 MG TABLET TO EQUAL A TOTAL OF 7 MILLIGRAMS   Yes Historical Provider, MD  predniSONE (DELTASONE) 5 MG tablet Take 5 mg by mouth daily with breakfast. IN CONJUNCTION WITH TWO 1 MG TABLETS TO EQUAL A TOTAL OF 7 MILLIGRAMS   Yes Historical Provider, MD  sennosides-docusate sodium (SENOKOT-S) 8.6-50 MG tablet Take 2 tablets by mouth 2 (two) times daily.   Yes Historical Provider, MD  sodium chloride 0.9 % nebulizer solution Take 3 mLs by nebulization every 6 (six) hours as needed for wheezing.   Yes Historical Provider, MD  Tamsulosin HCl (FLOMAX) 0.4 MG CAPS Take 0.4 mg by mouth at bedtime.    Yes Historical Provider, MD  traMADol (ULTRAM)  50 MG tablet Take 1 tablet (50 mg total) by mouth every 6 (six) hours as needed for moderate pain. 09/08/15  Yes Orson Eva, MD  traZODone (DESYREL) 100 MG tablet Take 1 tablet (100 mg total) by mouth at bedtime. 09/08/15  Yes David Tat, MD  vancomycin 500 mg in sodium chloride 0.9 % 100 mL Inject 500 mg into the vein every 12 (twelve) hours. Last day antibiotic on 09/26/15 09/09/15  Yes Orson Eva, MD  acidophilus (RISAQUAD) CAPS capsule Take 2 capsules by mouth daily. Patient not taking: Reported on 10/01/2015 08/09/15   Silver Huguenin Elgergawy, MD  cefTRIAXone 2 g in dextrose 5 % 50 mL Inject 2 g into the vein daily. Last day antibiotics on 09/26/15 Patient not taking: Reported on 10/01/2015 09/09/15   Orson Eva, MD  insulin NPH-regular Human (NOVOLIN 70/30) (70-30) 100 UNIT/ML injection Inject 20 Units into the skin 2 (two) times daily with a meal. Patient not taking: Reported on 10/01/2015 09/09/15   Orson Eva, MD  Liraglutide (VICTOZA) 18 MG/3ML SOPN Inject 1.2 mg into the skin daily at 3 pm.     Historical Provider, MD     Social History   Social History  . Marital Status: Married    Spouse Name: N/A  . Number of Children: N/A  . Years of Education: N/A   Occupational History  . Driver    Social History Main Topics  . Smoking status: Former Smoker    Quit date: 03/16/1957  . Smokeless tobacco: Never Used  . Alcohol Use: No  . Drug Use: No  . Sexual Activity: Yes    Birth Control/ Protection: None   Other Topics Concern  . Not on file   Social History Narrative   Marital status: Married x 55 years.      Children: 3 children; 5 grandchildren; 6 gg.      Lives: with wife.        Employment: retired; [employer].  Working every other week; 45 hours.      Tobacco:  Quit at age 38.  Alcohol:  None      Exercise: none      ADLs: indepedent with ADLs;  Uses cane and has a walker.  Drives.      Advanced Directives:  +Living Will.  DNR/DNI.  Wife is HCPOA.      Family Status  Relation Status Death Age  . Mother Deceased   . Father Deceased    Family History  Problem Relation Age of Onset  . Hypertension Other     ROS:  Full 14 point review of systems complete and found to be negative unless listed above.  Physical Exam: Blood pressure 132/88, pulse 165, temperature 102.9 F (39.4 C), temperature source Rectal, resp. rate 25, SpO2 97 %.  General: acutely ill-appearing, mildly diaphoretic Head: NCAT, EOMI, dry mucous membranes Lungs: CTA anteriorally, no w/r/c Heart: tachycardic, irregular rhythm, no appreciable m/r/g, no JVD, no LE edema, 2+ peripheral pulses, warm throughout Neck: No carotid bruits. No lymphadenopathy.  JVD. Abdomen: mildly distended, +RUQ TTP, no rebound or guarding Msk: deferred Extremities: No clubbing or cyanosis or edema Neuro: oriented only to self Skin: moist skin  Labs:   Lab Results  Component Value Date   WBC 1.6* 10/01/2015   HGB 10.1* 10/01/2015   HCT 31.0* 10/01/2015   MCV 90.6 10/01/2015   PLT 218 10/01/2015   No results for input(s): INR in the last 72 hours.  Recent Labs Lab 10/01/15 2145  NA 149*  K 2.9*  CL 114*  CO2 26  BUN 32*  CREATININE 2.33*  CALCIUM 9.2  PROT 5.3*  BILITOT 1.7*  ALKPHOS 117  ALT 14*  AST 14*  GLUCOSE 159*  ALBUMIN 2.5*   MAGNESIUM  Date Value Ref Range Status  09/09/2015 1.9 1.7 - 2.4 mg/dL Final   No results for input(s): CKTOTAL, CKMB, TROPONINI in the last 72 hours.  Recent Labs  10/01/15 2138  TROPIPOC 2.03*   No results found for: PROBNP Lab Results  Component Value Date   CHOL 207* 01/24/2014   HDL 51 01/24/2014   LDLCALC 133* 01/24/2014   TRIG 114 01/24/2014   No results found for: DDIMER No results found for: LIPASE, AMYLASE TSH  Date/Time Value Ref Range Status  08/29/2015 10:34 AM 1.709 0.350 - 4.500 uIU/mL Final  01/24/2014 02:25 PM 3.161 0.350 - 4.500 uIU/mL Final   VITAMIN B-12  Date/Time Value Ref Range Status   09/02/2015 02:16 PM 941* 180 - 914 pg/mL Final    Comment:    (NOTE) This assay is not validated for testing neonatal or myeloproliferative syndrome specimens for Vitamin B12 levels.    FOLATE  Date/Time Value Ref Range Status  09/02/2015 02:16 PM 18.6 >5.9 ng/mL Final    Echo: 08/29/15 Study Conclusions  - Left ventricle: The cavity size was mildly dilated. There was  mild concentric hypertrophy. Systolic function was normal. The  estimated ejection fraction was in the range of 55% to 60%.  Doppler parameters are consistent with abnormal left ventricular  relaxation (grade 1 diastolic dysfunction). - Aortic valve: Transvalvular velocity was within the normal range.  There was no stenosis. There was no regurgitation. - Mitral valve: Transvalvular velocity was within the normal range.  There was no evidence for stenosis. There was no regurgitation. - Right ventricle: The cavity size was normal. Wall thickness was  normal. Systolic function was normal. - Tricuspid valve: There was trivial regurgitation. - Pulmonary arteries: Systolic pressure was within the normal  range. PA peak pressure: 23 mm Hg (S).  ECG:  Per my review, atrial fibrillation with rapid ventricular response, 2-49mm ST depression V3-V6, 1-69mm ST elevation in aVR, non-specific Twave changes in diffuse leads  Radiology:  Dg Chest Port 1 View  10/01/2015  CLINICAL DATA:  Fever EXAM: PORTABLE CHEST 1 VIEW COMPARISON:  Aug 05, 2015 FINDINGS: There is airspace consolidation throughout the left lower lobe with small left effusion. There is a small calcified granuloma in the right base. There is slight atelectasis in the right base. There is cardiomegaly pulmonary vascularity within normal limits. There is atherosclerotic calcification in the aorta. Patient is status post coronary artery bypass grafting. No pneumothorax. No adenopathy. No apparent bone lesions. IMPRESSION: Left lower lobe airspace consolidation  consistent with pneumonia. Small left effusion. Mild right base atelectasis. Calcified granuloma right base. Stable cardiac prominence. There is aortic atherosclerosis. Followup PA and lateral chest radiographs recommended in 3-4 weeks following trial of antibiotic therapy to ensure resolution and exclude underlying malignancy. Electronically Signed   By: Lowella Grip III M.D.   On: 10/01/2015 21:53    ASSESSMENT AND PLAN:    JONQUIL TOMSIC is a 75 y.o. male with a history of CAD s/p CABG, IDDM, dyslipidemia, HTN, OSA, and a recent complicated hospital course 2/2 discitis with a prolonged course of antibiotics and currently residing at a SNF who presented with AMS and was noted to be in Afib with RVR and also to have an elevated troponin.  His presentation including fever and leukopenia are concerning for sepsis, with possible sources including his recent spinal manipulation and possibly concurrent PNA.  His atrial fibrillation is likely reactive in relation to his sepsis, and not the primary etiology of his AMS.  He does have rate-related ST depressions and mild elevation in aVR, and in conjunction with his mildly elevated troponin this most likely represents demand ischemia in this setting.  There is a lower clinical suspicion for an acute plaque rupture event.  1) Afib with RVR; likely reactive in the setting of sepsis - given soft BP consider esmolol gtt rather than diltiazem gtt as there is likely to be less BP effect with this medication, with either strategy would aim for goal HR <136mmHg - defer systemic anticoagulation given recent spinal surgery - if acutely hypotension and refractory to IVF then consider DCCV per ACLS protocol  2) Elevated troponin; likely demand ischemia in the setting of sepsis and rapid ventricular rate - trend troponin until peak - repeat ECG once ventricular rate is decreased to assess for improvement in ST depressions - continue ASA 81mg  PO QDAY - defer P2Y12  inhibition or systemic anticoagulation for now given lower clinical concern for acute plaque rupture event - continue high-intensity statin as per below  3) Diastolic Dysfunction; previously noted on TTE without prior documentation of clinical heart failure symptoms.  Now with significantly elevated BNP but without evidence of increased intravascular volume on exam or pulmonary vascular congestion on CXR.  Pt currently requires IVF given concern for sepsis.   4) Sepsis; febrile and leukopenic.  aggressive IVF in ED, management per MICU team with plan for BCx and empiric broad-spectrum antibiotics.  Notable RUQ pain on exam, consider abdominal imaging   5) Dyslipidemia - Atorvastatin 20mg  PO QHS  6) AKI on CKD; likely 2/2 sepsis, management per MICU team  Signed: Clayborne Dana, MD 10/01/2015 11:22 PM

## 2015-10-02 ENCOUNTER — Inpatient Hospital Stay (HOSPITAL_COMMUNITY): Payer: PPO

## 2015-10-02 ENCOUNTER — Telehealth: Payer: Self-pay | Admitting: Neurology

## 2015-10-02 ENCOUNTER — Other Ambulatory Visit (HOSPITAL_COMMUNITY): Payer: Self-pay

## 2015-10-02 DIAGNOSIS — Z951 Presence of aortocoronary bypass graft: Secondary | ICD-10-CM | POA: Diagnosis not present

## 2015-10-02 DIAGNOSIS — I481 Persistent atrial fibrillation: Secondary | ICD-10-CM | POA: Diagnosis present

## 2015-10-02 DIAGNOSIS — G9341 Metabolic encephalopathy: Secondary | ICD-10-CM | POA: Diagnosis present

## 2015-10-02 DIAGNOSIS — E785 Hyperlipidemia, unspecified: Secondary | ICD-10-CM | POA: Diagnosis present

## 2015-10-02 DIAGNOSIS — N184 Chronic kidney disease, stage 4 (severe): Secondary | ICD-10-CM | POA: Diagnosis present

## 2015-10-02 DIAGNOSIS — D61818 Other pancytopenia: Secondary | ICD-10-CM | POA: Diagnosis present

## 2015-10-02 DIAGNOSIS — N189 Chronic kidney disease, unspecified: Secondary | ICD-10-CM

## 2015-10-02 DIAGNOSIS — E1142 Type 2 diabetes mellitus with diabetic polyneuropathy: Secondary | ICD-10-CM | POA: Diagnosis not present

## 2015-10-02 DIAGNOSIS — F419 Anxiety disorder, unspecified: Secondary | ICD-10-CM | POA: Diagnosis not present

## 2015-10-02 DIAGNOSIS — F411 Generalized anxiety disorder: Secondary | ICD-10-CM | POA: Diagnosis not present

## 2015-10-02 DIAGNOSIS — J440 Chronic obstructive pulmonary disease with acute lower respiratory infection: Secondary | ICD-10-CM | POA: Diagnosis present

## 2015-10-02 DIAGNOSIS — N179 Acute kidney failure, unspecified: Secondary | ICD-10-CM | POA: Diagnosis present

## 2015-10-02 DIAGNOSIS — A419 Sepsis, unspecified organism: Secondary | ICD-10-CM | POA: Diagnosis present

## 2015-10-02 DIAGNOSIS — E870 Hyperosmolality and hypernatremia: Secondary | ICD-10-CM | POA: Diagnosis present

## 2015-10-02 DIAGNOSIS — Z794 Long term (current) use of insulin: Secondary | ICD-10-CM | POA: Diagnosis not present

## 2015-10-02 DIAGNOSIS — G9340 Encephalopathy, unspecified: Secondary | ICD-10-CM | POA: Diagnosis not present

## 2015-10-02 DIAGNOSIS — I2510 Atherosclerotic heart disease of native coronary artery without angina pectoris: Secondary | ICD-10-CM | POA: Diagnosis present

## 2015-10-02 DIAGNOSIS — Y95 Nosocomial condition: Secondary | ICD-10-CM | POA: Diagnosis present

## 2015-10-02 DIAGNOSIS — I130 Hypertensive heart and chronic kidney disease with heart failure and stage 1 through stage 4 chronic kidney disease, or unspecified chronic kidney disease: Secondary | ICD-10-CM | POA: Diagnosis present

## 2015-10-02 DIAGNOSIS — G4733 Obstructive sleep apnea (adult) (pediatric): Secondary | ICD-10-CM | POA: Diagnosis present

## 2015-10-02 DIAGNOSIS — I248 Other forms of acute ischemic heart disease: Secondary | ICD-10-CM | POA: Diagnosis present

## 2015-10-02 DIAGNOSIS — M353 Polymyalgia rheumatica: Secondary | ICD-10-CM | POA: Diagnosis present

## 2015-10-02 DIAGNOSIS — Y92239 Unspecified place in hospital as the place of occurrence of the external cause: Secondary | ICD-10-CM | POA: Diagnosis not present

## 2015-10-02 DIAGNOSIS — I4891 Unspecified atrial fibrillation: Secondary | ICD-10-CM | POA: Diagnosis not present

## 2015-10-02 DIAGNOSIS — I480 Paroxysmal atrial fibrillation: Secondary | ICD-10-CM | POA: Diagnosis present

## 2015-10-02 DIAGNOSIS — I5030 Unspecified diastolic (congestive) heart failure: Secondary | ICD-10-CM | POA: Diagnosis present

## 2015-10-02 DIAGNOSIS — Z888 Allergy status to other drugs, medicaments and biological substances status: Secondary | ICD-10-CM | POA: Diagnosis not present

## 2015-10-02 DIAGNOSIS — F09 Unspecified mental disorder due to known physiological condition: Secondary | ICD-10-CM | POA: Diagnosis not present

## 2015-10-02 DIAGNOSIS — I959 Hypotension, unspecified: Secondary | ICD-10-CM | POA: Diagnosis present

## 2015-10-02 DIAGNOSIS — R410 Disorientation, unspecified: Secondary | ICD-10-CM | POA: Diagnosis not present

## 2015-10-02 DIAGNOSIS — Z452 Encounter for adjustment and management of vascular access device: Secondary | ICD-10-CM

## 2015-10-02 DIAGNOSIS — W1830XA Fall on same level, unspecified, initial encounter: Secondary | ICD-10-CM | POA: Diagnosis not present

## 2015-10-02 DIAGNOSIS — R4182 Altered mental status, unspecified: Secondary | ICD-10-CM | POA: Diagnosis present

## 2015-10-02 DIAGNOSIS — G0490 Encephalitis and encephalomyelitis, unspecified: Secondary | ICD-10-CM | POA: Diagnosis present

## 2015-10-02 DIAGNOSIS — E1122 Type 2 diabetes mellitus with diabetic chronic kidney disease: Secondary | ICD-10-CM | POA: Diagnosis present

## 2015-10-02 DIAGNOSIS — Z87891 Personal history of nicotine dependence: Secondary | ICD-10-CM | POA: Diagnosis not present

## 2015-10-02 DIAGNOSIS — Z8249 Family history of ischemic heart disease and other diseases of the circulatory system: Secondary | ICD-10-CM | POA: Diagnosis not present

## 2015-10-02 DIAGNOSIS — L0291 Cutaneous abscess, unspecified: Secondary | ICD-10-CM | POA: Diagnosis not present

## 2015-10-02 DIAGNOSIS — J189 Pneumonia, unspecified organism: Secondary | ICD-10-CM | POA: Diagnosis present

## 2015-10-02 HISTORY — DX: Hypotension, unspecified: I95.9

## 2015-10-02 HISTORY — DX: Chronic kidney disease, stage 4 (severe): N17.9

## 2015-10-02 LAB — URINALYSIS, ROUTINE W REFLEX MICROSCOPIC
Glucose, UA: 250 mg/dL — AB
Ketones, ur: 15 mg/dL — AB
Nitrite: NEGATIVE
Protein, ur: >300 mg/dL — AB
Specific Gravity, Urine: 1.026 (ref 1.005–1.030)
pH: 5.5 (ref 5.0–8.0)

## 2015-10-02 LAB — I-STAT CG4 LACTIC ACID, ED
Lactic Acid, Venous: 1.09 mmol/L (ref 0.5–1.9)
Lactic Acid, Venous: 1.53 mmol/L (ref 0.5–1.9)

## 2015-10-02 LAB — GLUCOSE, CAPILLARY
Glucose-Capillary: 290 mg/dL — ABNORMAL HIGH (ref 65–99)
Glucose-Capillary: 312 mg/dL — ABNORMAL HIGH (ref 65–99)
Glucose-Capillary: 319 mg/dL — ABNORMAL HIGH (ref 65–99)
Glucose-Capillary: 248 mg/dL — ABNORMAL HIGH (ref 65–99)

## 2015-10-02 LAB — CBC
HCT: 28.1 % — ABNORMAL LOW (ref 39.0–52.0)
Hemoglobin: 9.1 g/dL — ABNORMAL LOW (ref 13.0–17.0)
MCH: 29.7 pg (ref 26.0–34.0)
MCHC: 32.4 g/dL (ref 30.0–36.0)
MCV: 91.8 fL (ref 78.0–100.0)
Platelets: 210 10*3/uL (ref 150–400)
RBC: 3.06 MIL/uL — ABNORMAL LOW (ref 4.22–5.81)
RDW: 16 % — ABNORMAL HIGH (ref 11.5–15.5)
WBC: 1.8 10*3/uL — ABNORMAL LOW (ref 4.0–10.5)

## 2015-10-02 LAB — I-STAT ARTERIAL BLOOD GAS, ED
Acid-base deficit: 2 mmol/L (ref 0.0–2.0)
Bicarbonate: 21.5 mEq/L (ref 20.0–24.0)
O2 Saturation: 97 %
Patient temperature: 102.9
TCO2: 22 mmol/L (ref 0–100)
pCO2 arterial: 34 mmHg — ABNORMAL LOW (ref 35.0–45.0)
pH, Arterial: 7.417 (ref 7.350–7.450)
pO2, Arterial: 102 mmHg — ABNORMAL HIGH (ref 80.0–100.0)

## 2015-10-02 LAB — COMPREHENSIVE METABOLIC PANEL
ALT: 13 U/L — ABNORMAL LOW (ref 17–63)
AST: 12 U/L — ABNORMAL LOW (ref 15–41)
Albumin: 2.1 g/dL — ABNORMAL LOW (ref 3.5–5.0)
Alkaline Phosphatase: 99 U/L (ref 38–126)
Anion gap: 11 (ref 5–15)
BUN: 33 mg/dL — ABNORMAL HIGH (ref 6–20)
CO2: 22 mmol/L (ref 22–32)
Calcium: 8.2 mg/dL — ABNORMAL LOW (ref 8.9–10.3)
Chloride: 114 mmol/L — ABNORMAL HIGH (ref 101–111)
Creatinine, Ser: 2.28 mg/dL — ABNORMAL HIGH (ref 0.61–1.24)
GFR calc Af Amer: 31 mL/min — ABNORMAL LOW (ref >60)
GFR calc non Af Amer: 26 mL/min — ABNORMAL LOW (ref >60)
Glucose, Bld: 255 mg/dL — ABNORMAL HIGH (ref 65–99)
Potassium: 3.6 mmol/L (ref 3.5–5.1)
Sodium: 147 mmol/L — ABNORMAL HIGH (ref 135–145)
Total Bilirubin: 1.4 mg/dL — ABNORMAL HIGH (ref 0.3–1.2)
Total Protein: 4.5 g/dL — ABNORMAL LOW (ref 6.5–8.1)

## 2015-10-02 LAB — PATHOLOGIST SMEAR REVIEW

## 2015-10-02 LAB — URINE MICROSCOPIC-ADD ON

## 2015-10-02 LAB — CBG MONITORING, ED: Glucose-Capillary: 261 mg/dL — ABNORMAL HIGH (ref 65–99)

## 2015-10-02 LAB — TROPONIN I
Troponin I: 0.9 ng/mL (ref <0.03)
Troponin I: 1.02 ng/mL (ref <0.03)
Troponin I: 0.86 ng/mL (ref <0.03)

## 2015-10-02 LAB — MAGNESIUM: Magnesium: 1.6 mg/dL — ABNORMAL LOW (ref 1.7–2.4)

## 2015-10-02 LAB — TSH: TSH: 1.219 u[IU]/mL (ref 0.350–4.500)

## 2015-10-02 LAB — PREALBUMIN: Prealbumin: 5.7 mg/dL — ABNORMAL LOW (ref 18–38)

## 2015-10-02 LAB — MRSA PCR SCREENING: MRSA by PCR: POSITIVE — AB

## 2015-10-02 MED ORDER — AMIODARONE HCL IN DEXTROSE 360-4.14 MG/200ML-% IV SOLN
60.0000 mg/h | INTRAVENOUS | Status: AC
  Administered 2015-10-02: 60 mg/h via INTRAVENOUS
  Filled 2015-10-02: qty 200

## 2015-10-02 MED ORDER — LORAZEPAM 2 MG/ML IJ SOLN
1.0000 mg | Freq: Once | INTRAMUSCULAR | Status: AC
  Administered 2015-10-02: 2 mg via INTRAVENOUS
  Filled 2015-10-02: qty 1

## 2015-10-02 MED ORDER — PREDNISONE 5 MG PO TABS
7.0000 mg | ORAL_TABLET | Freq: Every day | ORAL | Status: DC
  Administered 2015-10-02 – 2015-10-14 (×13): 7 mg via ORAL
  Filled 2015-10-02: qty 2
  Filled 2015-10-02 (×2): qty 7
  Filled 2015-10-02 (×2): qty 2
  Filled 2015-10-02: qty 7
  Filled 2015-10-02: qty 2
  Filled 2015-10-02: qty 7
  Filled 2015-10-02: qty 2
  Filled 2015-10-02 (×2): qty 7
  Filled 2015-10-02 (×2): qty 2

## 2015-10-02 MED ORDER — INSULIN ASPART 100 UNIT/ML ~~LOC~~ SOLN
0.0000 [IU] | SUBCUTANEOUS | Status: DC
  Administered 2015-10-02: 5 [IU] via SUBCUTANEOUS
  Administered 2015-10-02: 7 [IU] via SUBCUTANEOUS
  Administered 2015-10-02: 5 [IU] via SUBCUTANEOUS
  Administered 2015-10-02: 7 [IU] via SUBCUTANEOUS
  Administered 2015-10-02 – 2015-10-03 (×3): 3 [IU] via SUBCUTANEOUS
  Administered 2015-10-03 (×2): 5 [IU] via SUBCUTANEOUS
  Filled 2015-10-02: qty 1

## 2015-10-02 MED ORDER — FLUCONAZOLE IN SODIUM CHLORIDE 200-0.9 MG/100ML-% IV SOLN
200.0000 mg | Freq: Once | INTRAVENOUS | Status: AC
Start: 2015-10-02 — End: 2015-10-02
  Administered 2015-10-02: 200 mg via INTRAVENOUS
  Filled 2015-10-02: qty 100

## 2015-10-02 MED ORDER — TAMSULOSIN HCL 0.4 MG PO CAPS
0.4000 mg | ORAL_CAPSULE | Freq: Every day | ORAL | Status: DC
  Administered 2015-10-02 – 2015-10-13 (×12): 0.4 mg via ORAL
  Filled 2015-10-02 (×12): qty 1

## 2015-10-02 MED ORDER — ALBUTEROL SULFATE (2.5 MG/3ML) 0.083% IN NEBU
2.5000 mg | INHALATION_SOLUTION | RESPIRATORY_TRACT | Status: DC | PRN
  Administered 2015-10-11: 2.5 mg via RESPIRATORY_TRACT
  Filled 2015-10-02: qty 3

## 2015-10-02 MED ORDER — DIGOXIN 0.25 MG/ML IJ SOLN
0.2500 mg | Freq: Four times a day (QID) | INTRAMUSCULAR | Status: AC
  Administered 2015-10-02 – 2015-10-03 (×3): 0.25 mg via INTRAVENOUS
  Filled 2015-10-02 (×3): qty 2

## 2015-10-02 MED ORDER — LORAZEPAM 2 MG/ML IJ SOLN
INTRAMUSCULAR | Status: AC
  Administered 2015-10-03: 1 mg via INTRAVENOUS
  Filled 2015-10-02: qty 1

## 2015-10-02 MED ORDER — MUPIROCIN 2 % EX OINT
1.0000 "application " | TOPICAL_OINTMENT | Freq: Two times a day (BID) | CUTANEOUS | Status: AC
  Administered 2015-10-02 – 2015-10-06 (×10): 1 via NASAL
  Filled 2015-10-02 (×2): qty 22

## 2015-10-02 MED ORDER — MAGNESIUM SULFATE IN D5W 1-5 GM/100ML-% IV SOLN
1.0000 g | Freq: Once | INTRAVENOUS | Status: AC
  Administered 2015-10-02: 1 g via INTRAVENOUS
  Filled 2015-10-02: qty 100

## 2015-10-02 MED ORDER — DEXTROSE 5 % IV SOLN
500.0000 mg | INTRAVENOUS | Status: DC
  Administered 2015-10-02 – 2015-10-03 (×2): 500 mg via INTRAVENOUS
  Filled 2015-10-02 (×2): qty 500

## 2015-10-02 MED ORDER — INSULIN GLARGINE 100 UNIT/ML ~~LOC~~ SOLN
10.0000 [IU] | Freq: Every day | SUBCUTANEOUS | Status: DC
  Administered 2015-10-02: 10 [IU] via SUBCUTANEOUS
  Filled 2015-10-02 (×2): qty 0.1

## 2015-10-02 MED ORDER — VANCOMYCIN HCL IN DEXTROSE 1-5 GM/200ML-% IV SOLN
1000.0000 mg | INTRAVENOUS | Status: DC
  Administered 2015-10-03: 1000 mg via INTRAVENOUS
  Filled 2015-10-02 (×2): qty 200

## 2015-10-02 MED ORDER — HYDROCORTISONE NA SUCCINATE PF 100 MG IJ SOLR
100.0000 mg | INTRAMUSCULAR | Status: AC
  Administered 2015-10-02: 100 mg via INTRAVENOUS
  Filled 2015-10-02: qty 2

## 2015-10-02 MED ORDER — IPRATROPIUM BROMIDE 0.02 % IN SOLN
0.5000 mg | RESPIRATORY_TRACT | Status: DC | PRN
Start: 2015-10-02 — End: 2015-10-14

## 2015-10-02 MED ORDER — PIPERACILLIN-TAZOBACTAM 3.375 G IVPB
3.3750 g | Freq: Three times a day (TID) | INTRAVENOUS | Status: DC
  Administered 2015-10-02 – 2015-10-07 (×15): 3.375 g via INTRAVENOUS
  Filled 2015-10-02 (×17): qty 50

## 2015-10-02 MED ORDER — SODIUM CHLORIDE 0.9 % IV SOLN
INTRAVENOUS | Status: DC
  Administered 2015-10-02 – 2015-10-04 (×4): via INTRAVENOUS

## 2015-10-02 MED ORDER — PREDNISONE 5 MG PO TABS: 5.0000 mg | ORAL_TABLET | Freq: Every day | ORAL | Status: DC

## 2015-10-02 MED ORDER — CHLORHEXIDINE GLUCONATE 0.12 % MT SOLN
15.0000 mL | Freq: Two times a day (BID) | OROMUCOSAL | Status: DC
  Administered 2015-10-02 – 2015-10-14 (×23): 15 mL via OROMUCOSAL
  Filled 2015-10-02 (×18): qty 15

## 2015-10-02 MED ORDER — PILOCARPINE HCL 2 % OP SOLN
1.0000 [drp] | Freq: Three times a day (TID) | OPHTHALMIC | Status: DC
  Administered 2015-10-02 – 2015-10-14 (×36): 1 [drp] via OPHTHALMIC
  Filled 2015-10-02 (×3): qty 15

## 2015-10-02 MED ORDER — ONDANSETRON HCL 4 MG PO TABS
4.0000 mg | ORAL_TABLET | Freq: Four times a day (QID) | ORAL | Status: DC | PRN
Start: 2015-10-02 — End: 2015-10-14
  Administered 2015-10-07: 4 mg via ORAL
  Filled 2015-10-02: qty 1

## 2015-10-02 MED ORDER — POTASSIUM CHLORIDE 10 MEQ/100ML IV SOLN
10.0000 meq | INTRAVENOUS | Status: AC
  Administered 2015-10-02 (×4): 10 meq via INTRAVENOUS
  Filled 2015-10-02 (×5): qty 100

## 2015-10-02 MED ORDER — ONDANSETRON HCL 4 MG/2ML IJ SOLN
4.0000 mg | Freq: Four times a day (QID) | INTRAMUSCULAR | Status: DC | PRN
  Administered 2015-10-02 – 2015-10-09 (×4): 4 mg via INTRAVENOUS
  Filled 2015-10-02 (×4): qty 2

## 2015-10-02 MED ORDER — CETYLPYRIDINIUM CHLORIDE 0.05 % MT LIQD
7.0000 mL | Freq: Two times a day (BID) | OROMUCOSAL | Status: DC
  Administered 2015-10-03 – 2015-10-14 (×22): 7 mL via OROMUCOSAL

## 2015-10-02 MED ORDER — HEPARIN SODIUM (PORCINE) 5000 UNIT/ML IJ SOLN
5000.0000 [IU] | Freq: Three times a day (TID) | INTRAMUSCULAR | Status: DC
  Administered 2015-10-02 – 2015-10-08 (×19): 5000 [IU] via SUBCUTANEOUS
  Filled 2015-10-02 (×19): qty 1

## 2015-10-02 MED ORDER — LORAZEPAM 2 MG/ML IJ SOLN
1.0000 mg | Freq: Once | INTRAMUSCULAR | Status: AC
  Administered 2015-10-03: 1 mg via INTRAVENOUS

## 2015-10-02 MED ORDER — CHLORHEXIDINE GLUCONATE CLOTH 2 % EX PADS
6.0000 | MEDICATED_PAD | Freq: Every day | CUTANEOUS | Status: AC
  Administered 2015-10-03 – 2015-10-07 (×5): 6 via TOPICAL

## 2015-10-02 MED ORDER — FLUCONAZOLE IN SODIUM CHLORIDE 200-0.9 MG/100ML-% IV SOLN: 200.0000 mg | INTRAVENOUS | Status: DC

## 2015-10-02 MED ORDER — ACETAMINOPHEN 650 MG RE SUPP: 650.0000 mg | Freq: Four times a day (QID) | RECTAL | Status: DC | PRN

## 2015-10-02 MED ORDER — FENTANYL CITRATE (PF) 100 MCG/2ML IJ SOLN
INTRAMUSCULAR | Status: AC
  Administered 2015-10-02: 25 ug via INTRAVENOUS
  Filled 2015-10-02: qty 2

## 2015-10-02 MED ORDER — PIPERACILLIN-TAZOBACTAM 3.375 G IVPB
3.3750 g | Freq: Three times a day (TID) | INTRAVENOUS | Status: DC
  Filled 2015-10-02 (×2): qty 50

## 2015-10-02 MED ORDER — SODIUM CHLORIDE 0.9% FLUSH
3.0000 mL | Freq: Two times a day (BID) | INTRAVENOUS | Status: DC
  Administered 2015-10-02 – 2015-10-14 (×17): 3 mL via INTRAVENOUS

## 2015-10-02 MED ORDER — AMIODARONE LOAD VIA INFUSION
150.0000 mg | Freq: Once | INTRAVENOUS | Status: AC
  Administered 2015-10-02: 150 mg via INTRAVENOUS
  Filled 2015-10-02: qty 83.34

## 2015-10-02 MED ORDER — FENTANYL CITRATE (PF) 100 MCG/2ML IJ SOLN
12.5000 ug | INTRAMUSCULAR | Status: DC | PRN
  Administered 2015-10-02 (×2): 25 ug via INTRAVENOUS
  Administered 2015-10-02: 12.5 ug via INTRAVENOUS
  Administered 2015-10-02 – 2015-10-07 (×6): 25 ug via INTRAVENOUS
  Filled 2015-10-02 (×8): qty 2

## 2015-10-02 MED ORDER — FINASTERIDE 5 MG PO TABS
5.0000 mg | ORAL_TABLET | Freq: Every day | ORAL | Status: DC
  Administered 2015-10-02 – 2015-10-13 (×12): 5 mg via ORAL
  Filled 2015-10-02 (×12): qty 1

## 2015-10-02 MED ORDER — MOMETASONE FURO-FORMOTEROL FUM 200-5 MCG/ACT IN AERO
2.0000 | INHALATION_SPRAY | Freq: Two times a day (BID) | RESPIRATORY_TRACT | Status: DC
  Administered 2015-10-02 – 2015-10-14 (×20): 2 via RESPIRATORY_TRACT
  Filled 2015-10-02: qty 8.8

## 2015-10-02 MED ORDER — ACETAMINOPHEN 325 MG PO TABS
650.0000 mg | ORAL_TABLET | Freq: Four times a day (QID) | ORAL | Status: DC | PRN
  Administered 2015-10-05: 650 mg via ORAL
  Filled 2015-10-02: qty 2

## 2015-10-02 MED ORDER — CYCLOSPORINE 0.05 % OP EMUL
1.0000 [drp] | Freq: Two times a day (BID) | OPHTHALMIC | Status: DC
  Administered 2015-10-02 – 2015-10-14 (×25): 1 [drp] via OPHTHALMIC
  Filled 2015-10-02 (×27): qty 1

## 2015-10-02 MED ORDER — AMIODARONE HCL IN DEXTROSE 360-4.14 MG/200ML-% IV SOLN
30.0000 mg/h | INTRAVENOUS | Status: DC
  Administered 2015-10-02 – 2015-10-04 (×5): 30 mg/h via INTRAVENOUS
  Filled 2015-10-02 (×6): qty 200

## 2015-10-02 NOTE — Progress Notes (Signed)
Amiodarone Drug - Drug Interaction Consult Note  Recommendations: MONITOR AS MORE MEDS ARE ORDERED. Amiodarone is metabolized by the cytochrome P450 system and therefore has the potential to cause many drug interactions. Amiodarone has an average plasma half-life of 50 days (range 20 to 100 days).   There is potential for drug interactions to occur several weeks or months after stopping treatment and the onset of drug interactions may be slow after initiating amiodarone.   []  Statins: Increased risk of myopathy. Simvastatin- restrict dose to 20mg  daily. Other statins: counsel patients to report any muscle pain or weakness immediately.  []  Anticoagulants: Amiodarone can increase anticoagulant effect. Consider warfarin dose reduction. Patients should be monitored closely and the dose of anticoagulant altered accordingly, remembering that amiodarone levels take several weeks to stabilize.  []  Antiepileptics: Amiodarone can increase plasma concentration of phenytoin, the dose should be reduced. Note that small changes in phenytoin dose can result in large changes in levels. Monitor patient and counsel on signs of toxicity.  []  Beta blockers: increased risk of bradycardia, AV block and myocardial depression. Sotalol - avoid concomitant use.  []   Calcium channel blockers (diltiazem and verapamil): increased risk of bradycardia, AV block and myocardial depression.  []   Cyclosporine: Amiodarone increases levels of cyclosporine. Reduced dose of cyclosporine is recommended.  []  Digoxin dose should be halved when amiodarone is started.  []  Diuretics: increased risk of cardiotoxicity if hypokalemia occurs.  []  Oral hypoglycemic agents (glyburide, glipizide, glimepiride): increased risk of hypoglycemia. Patient's glucose levels should be monitored closely when initiating amiodarone therapy.   []  Drugs that prolong the QT interval:  Torsades de pointes risk may be increased with concurrent use - avoid  if possible.  Monitor QTc, also keep magnesium/potassium WNL if concurrent therapy can't be avoided. Marland Kitchen Antibiotics: e.g. fluoroquinolones, erythromycin. . Antiarrhythmics: e.g. quinidine, procainamide, disopyramide, sotalol. . Antipsychotics: e.g. phenothiazines, haloperidol.  . Lithium, tricyclic antidepressants, and methadone.  Thank You,  Wynona Neat, PharmD, BCPS 10/02/2015 1:29 AM

## 2015-10-02 NOTE — Progress Notes (Signed)
PROGRESS NOTE    Jacob [name]  [id number] DOB: [birth year] DOA: 10/01/2015 PCP: No primary care provider on file.    Brief Narrative: AVIER DALSANTO is a 75 y.o. male with medical history significant of CAD, CABG, IDDM, COPD, dyslipidemia, RCC, PMR, HTN, CKD, OSA , lumbar stenosis s/p correction, and recently was hospitalized with discitis , was on rocephin and vancomycin till 7/13, presents for altered mental status. On arrival to ED, he was found to be febrile, tachycardic, hypotensive, in a fib with RVR, . He was admitted to step down for further management.  Cardiology and PCCM consulted.  PCCM signed off recommending a repeat MRI of the lumbar spine.  Assessment & Plan:   Principal Problem:   Sepsis ([place]) Active Problems:   Diabetes mellitus, type 2 ([place])   CHF with left ventricular diastolic dysfunction, NYHA class 2 (HCC)   Acute encephalopathy   Elevated troponin   Acute kidney injury superimposed on chronic kidney disease (HCC)   Atrial fibrillation with RVR (HCC)   Hypotension  Sepsis of unclear source possibly from health care associated pneumonia vs recurrent discitis.  CXR shows left lobe air space consolidation.  MRI of the lumbar spine ordered to further evaluate the discitis.  He just completed 4 weeks of vancomycin and rocephin on 7/13.  Currently he is on IV vancomycin and IV zosyn, IV azithromycin.  Lactic acid is normalized, trend pro calcitonin.  ID consult will be requested.  Blood cultures and urine cultures done and pending.    Atrial fibrillation with RVR: Rate is better controlled after starting amiodarone gtt.  Serial troponins and Dr Wynonia Lawman on board for further recommendations.  Dig added by cardiology.   Acute kidney injury; Unclear etiology, sepsis, dehydration.  UA shows SMALL LEUKOCYTOSIS with few bacteria and wbc.  Urine cultures done and pending.  US renal ordered and currently on IV fluids.  Monitor renal parameters.     IDDM: Currently npo, will get SLP eval before we start him on carb modified diet.  Resume his lantus and SSI.  CBG (last 3)   Recent Labs  10/02/15 0406 10/02/15 1006 10/02/15 1340  GLUCAP 261* 290* 312*    Nutrition: Start soft diet and get SLP evaluation.   Hypernatremia: Probably from dehydration and monitor renal parameters.   Leukopenia,  Unclear etiology.  Improving.  Monitor.   anemia: Baseline hemoglobin around 10, currently its 9.  Monitor.   COPD: No wheezing heard, resume Symbicort and duo nebs as needed.   PMR:  On  Maintenance dose of prednisone, continue the same.   Hypertension: controlled.    DVT prophylaxis: sq heparin. Code Status: do not intubate only. Family Communication: wife and grand daughter at bedside Disposition Plan: pending further investigation.    Consultants:   Cardiology Dr Wynonia Lawman.  PCCM    Procedures: MRI LUMBAR SPINE.    Antimicrobials:  Vancomycin  Zosyn 7/19 Azithromycin 7/19  Subjective: Reports some back pain.   Objective: Filed Vitals:   10/02/15 0925 10/02/15 0950 10/02/15 1100 10/02/15 1200  BP:  121/75 100/78 105/59  Pulse:  119 129 63  Temp: 97.8 F (36.6 C) 97.8 F (36.6 C)  97.8 F (36.6 C)  TempSrc: Rectal Oral  Axillary  Resp:  16 17 15   SpO2:  97% 99% 100%    Intake/Output Summary (Last 24 hours) at 10/02/15 1513 Last data filed at 10/02/15 0321  Gross per 24 hour  Intake      3 ml  Output      0 ml  Net      3 ml   There were no vitals filed for this visit.  Examination:  General exam: Appears calm and comfortable , not in any distress.  Respiratory system: diminished at bases, no wheezing or rhonchi.  Cardiovascular system: irregular, s1s2, rate controlled.  Gastrointestinal system: Abdomen is nondistended, soft and nontender. No organomegaly or masses felt. Normal bowel sounds heard. Central nervous system: alert on exam, slightly confused. Able to recognize family.   Skin: No rashes, lesions or ulcers     Data Reviewed: I have personally reviewed following labs and imaging studies  CBC:  Recent Labs Lab 10/01/15 2145 10/02/15 0600  WBC 1.6* 1.8*  NEUTROABS 0.1*  --   HGB 10.1* 9.1*  HCT 31.0* 28.1*  MCV 90.6 91.8  PLT 218 [id number]   Basic Metabolic Panel:  Recent Labs Lab 10/01/15 2145 10/02/15 0600  NA 149* 147*  K 2.9* 3.6  CL 114* 114*  CO2 26 22  GLUCOSE 159* 255*  BUN 32* 33*  CREATININE 2.33* 2.28*  CALCIUM 9.2 8.2*  MG 1.7 1.6*   GFR: CrCl cannot be calculated (Unknown ideal weight.). Liver Function Tests:  Recent Labs Lab 10/01/15 2145 10/02/15 0600  AST 14* 12*  ALT 14* 13*  ALKPHOS 117 99  BILITOT 1.7* 1.4*  PROT 5.3* 4.5*  ALBUMIN 2.5* 2.1*   No results for input(s): LIPASE, AMYLASE in the last 168 hours. No results for input(s): AMMONIA in the last 168 hours. Coagulation Profile: No results for input(s): INR, PROTIME in the last 168 hours. Cardiac Enzymes:  Recent Labs Lab 10/02/15 0311 10/02/15 0948  TROPONINI 0.90* 1.02*   BNP (last 3 results) No results for input(s): PROBNP in the last 8760 hours. HbA1C: No results for input(s): HGBA1C in the last 72 hours. CBG:  Recent Labs Lab 10/02/15 0406 10/02/15 1006 10/02/15 1340  GLUCAP 261* 290* 312*   Lipid Profile: No results for input(s): CHOL, HDL, LDLCALC, TRIG, CHOLHDL, LDLDIRECT in the last 72 hours. Thyroid Function Tests:  Recent Labs  10/02/15 0600  TSH 1.219   Anemia Panel: No results for input(s): VITAMINB12, FOLATE, FERRITIN, TIBC, IRON, RETICCTPCT in the last 72 hours. Sepsis Labs:  Recent Labs Lab 10/02/15 0009 10/02/15 0317  LATICACIDVEN 1.53 1.09    Recent Results (from the past 240 hour(s))  Culture, blood (Routine X 2) w Reflex to ID Panel     Status: None (Preliminary result)   Collection Time: 10/01/15 11:40 PM  Result Value Ref Range Status   Specimen Description BLOOD LEFT ARM  Final   Special Requests    Final    BOTTLES DRAWN AEROBIC AND ANAEROBIC 10CC PT ON VANC ZOSYN   Culture PENDING  Incomplete   Report Status PENDING  Incomplete  MRSA PCR Screening     Status: Abnormal   Collection Time: 10/02/15  9:40 AM  Result Value Ref Range Status   MRSA by PCR POSITIVE (A) NEGATIVE Final    Comment:        The GeneXpert MRSA Assay (FDA approved for NASAL specimens only), is one component of a comprehensive MRSA colonization surveillance program. It is not intended to diagnose MRSA infection nor to guide or monitor treatment for MRSA infections. RESULT CALLED TO, READ BACK BY AND VERIFIED WITH: Bernarda Caffey RN 11:45 10/02/15 ([email or web address])          Radiology Studies: Dg Chest Port 1 View  10/01/2015  CLINICAL DATA:  Fever EXAM: PORTABLE CHEST 1 VIEW COMPARISON:  Aug 05, 2015 FINDINGS: There is airspace consolidation throughout the left lower lobe with small left effusion. There is a small calcified granuloma in the right base. There is slight atelectasis in the right base. There is cardiomegaly pulmonary vascularity within normal limits. There is atherosclerotic calcification in the aorta. Patient is status post coronary artery bypass grafting. No pneumothorax. No adenopathy. No apparent bone lesions. IMPRESSION: Left lower lobe airspace consolidation consistent with pneumonia. Small left effusion. Mild right base atelectasis. Calcified granuloma right base. Stable cardiac prominence. There is aortic atherosclerosis. Followup PA and lateral chest radiographs recommended in 3-4 weeks following trial of antibiotic therapy to ensure resolution and exclude underlying malignancy. Electronically Signed   By: Lowella Grip III M.D.   On: 10/01/2015 21:53        Scheduled Meds: . azithromycin  500 mg Intravenous Q24H  . [START ON 10/03/2015] Chlorhexidine Gluconate Cloth  6 each Topical Q0600  . cycloSPORINE  1 drop Both Eyes BID  . digoxin  0.25 mg Intravenous Q6H  . finasteride  5 mg Oral  QHS  . heparin  5,000 Units Subcutaneous Q8H  . insulin aspart  0-9 Units Subcutaneous Q4H  . LORazepam  1-2 mg Intravenous Once  . mupirocin ointment  1 application Nasal BID  . pilocarpine  1 drop Both Eyes TID  . piperacillin-tazobactam (ZOSYN)  IV  3.375 g Intravenous Q8H  . predniSONE  7 mg Oral Q breakfast  . sodium chloride flush  3 mL Intravenous Q12H  . tamsulosin  0.4 mg Oral QHS  . vancomycin  1,000 mg Intravenous Q24H   Continuous Infusions: . sodium chloride 75 mL/hr at 10/02/15 0658  . amiodarone 30 mg/hr (10/02/15 1446)        Time spent: 30 minutes.     Hosie Poisson, MD [facility] Pager [phone]   If 7PM-7AM, please contact night-coverage [email or web address] Password [email or web address] 10/02/2015, 3:13 PM

## 2015-10-02 NOTE — ED Notes (Signed)
RN ensured IV compatibility of all IV medications using micromedex

## 2015-10-02 NOTE — Progress Notes (Signed)
Results for ZAHEER, SZCZESNIAK (MRN [id number]) as of 10/02/2015 13:17  Ref. Range 10/02/2015 04:06 10/02/2015 10:06  Glucose-Capillary Latest Ref Range: 65-99 mg/dL 261 (H) 290 (H)  Noted that blood sugars are greater than 180 mg/dl. Patient takes Levemir 10 units daily at home. Recommend starting Levemir 10 units daily if CBGs continue to be elevated. Will continue to monitor blood sugars while in the hospital. Harvel Ricks RN BSN CDE

## 2015-10-02 NOTE — ED Notes (Signed)
ABG collected  

## 2015-10-02 NOTE — ED Notes (Signed)
Pt transported upstairs to 2C02

## 2015-10-02 NOTE — ED Notes (Signed)
IV team at bedside 

## 2015-10-02 NOTE — Progress Notes (Signed)
Pharmacy Antibiotic Note  Jacob [name] is a 75 y.o. male admitted on 10/01/2015 with AMS/afib/rule out sepsis.  Pharmacy has been consulted for Vancomycin/Zosyn dosing.  Plan: -Vancomycin 1000 mg IV q24h -Zosyn 3.375G IV q8h to be infused over 4 hours -Trend WBC, temp, renal function  -Drug levels as indicated   Temp (24hrs), Avg:102.9 F (39.4 C), Min:102.9 F (39.4 C), Max:102.9 F (39.4 C)   Recent Labs Lab 10/01/15 2145 10/02/15 0009  WBC 1.6*  --   CREATININE 2.33*  --   LATICACIDVEN  --  1.53    CrCl cannot be calculated (Unknown ideal weight.).    Allergies  Allergen Reactions  . Ace Inhibitors Other (See Comments)    Probably nausea and vomiting per patient   . Actonel [Risedronate] Nausea And Vomiting  . Ciprocinonide [Fluocinolone] Other (See Comments)    Probably nausea and vomiting per patient  . Flunisolide Other (See Comments)    Probably nausea and vomiting per patient   . Metformin And Related Other (See Comments)    Probably nausea and vomiting per patient   . Sertraline Other (See Comments)    Probably nausea and vomiting per patient   . Sulindac Other (See Comments)    Probably nausea and vomiting per patient   . Terazosin Other (See Comments)    Probably nausea and vomiting per patient     Narda Bonds 10/02/2015 2:32 AM

## 2015-10-02 NOTE — ED Notes (Signed)
MD at bedside. 

## 2015-10-02 NOTE — Consult Note (Addendum)
Name: Jacob [name] MRN: [name] DOB: 1940/05/08    ADMISSION DATE:  10/01/2015 CONSULTATION DATE:  10/02/2015  REFERRING MD :  Dr. Stark Jock EDP  CHIEF COMPLAINT:  AMS  HISTORY OF PRESENT ILLNESS:  75 year old male with past medical history as below. Recent medical history has been complicated. Initially fell in May of this year and injured his back requiring lumbar laminectomy. Post operative course was complicated by HCAP and AKI. Neurologically and neurovascularly he was recovered, but he was overall deconditioned and discharged [facility] 6[facility]. 6/23 he was re-admitted for AMS secondary to discitis at the operative level. He was treated with long term antibiotics and required re-do laminectomy/discectomy for source control. He was discharged to SNF with PICC in place and IV antibiotics per ID recommendation. 7/18 he again presented from SNF with complaints of AMS. In the emergency department he was found to febrile with WBC 1.9. He was also tachycardic with AF RVR with rates 150-180. Diltiazem caused precipitous drop in BP and was stopped. He was admitted to the hosptilists initially, but with hypotension despite IVF PCCM has been asked to see for further eval.   SIGNIFICANT EVENTS  5/10 admit for back pain, laminectomy 6/23 admit for sepsis r/t discitis, re-do laminectomy.  7/18 admit for AMS, presumed sepsis, source unclear  STUDIES:    PAST MEDICAL HISTORY :   has a past medical history of Allergy; Asthma; Diabetes mellitus without complication ([place]); Cataract; Arthritis; Polymyalgia rheumatica (HCC); COPD (chronic obstructive pulmonary disease) ([place]); Anemia; Hypertension; BPH (benign prostatic hyperplasia); Sleep apnea; Shortness of breath dyspnea; Anxiety; Chronic kidney disease; Coronary artery disease; Hepatitis; and Cancer of kidney ([place]).  has past surgical history that includes Appendectomy; Hernia repair; Prostate surgery; Coronary artery bypass graft (03/17/1995); Cataract extraction  w/PHACO (Right, 11/28/2014); Lumbar laminectomy/decompression microdiscectomy (N/A, 07/30/2015); and Lumbar laminectomy/decompression microdiscectomy (N/A, 09/06/2015). Prior to Admission medications   Medication Sig Start Date End Date Taking? Authorizing Provider  acetaminophen (TYLENOL) 325 MG tablet Take 2 tablets (650 mg total) by mouth every 6 (six) hours as needed for mild pain (or Fever >/= 101). 08/09/15  Yes Albertine Patricia, MD  albuterol (PROVENTIL HFA;VENTOLIN HFA) 108 (90 BASE) MCG/ACT inhaler Inhale 2 puffs into the lungs every 6 (six) hours as needed for wheezing or shortness of breath. 07/16/13  Yes Wardell Honour, MD  amLODipine (NORVASC) 10 MG tablet Take 10 mg by mouth daily with supper.    Yes Historical Provider, MD  aspirin EC 81 MG tablet Take 81 mg by mouth daily with lunch.   Yes Historical Provider, MD  atorvastatin (LIPITOR) 20 MG tablet Take 1 tablet (20 mg total) by mouth daily at 6 PM. 09/08/15  Yes Orson Eva, MD  Brinzolamide-Brimonidine [facility]) 1-0.2 % SUSP Place 1 drop into both eyes 2 (two) times daily.    Yes Historical Provider, MD  budesonide-formoterol (SYMBICORT) 160-4.5 MCG/ACT inhaler Inhale 2 puffs into the lungs 2 (two) times daily.   Yes Historical Provider, MD  calcitRIOL (ROCALTROL) 0.25 MCG capsule Take 0.25 mcg by mouth every Monday, Wednesday, and Friday.   Yes Historical Provider, MD  cycloSPORINE (RESTASIS) 0.05 % ophthalmic emulsion Place 1 drop into both eyes 2 (two) times daily.   Yes Historical Provider, MD  ergocalciferol (VITAMIN D2) 50000 units capsule Take 50,000 Units by mouth See admin instructions. Take one tablet by mouth twice a week on Saturday and Wednesday   Yes Historical Provider, MD  feeding supplement, [place], ([place]) LIQD  Take 237 mLs by mouth 3 (three) times daily between meals. 08/09/15  Yes Silver Huguenin Elgergawy, MD  finasteride (PROSCAR) 5 MG tablet Take 5 mg by mouth at bedtime.    Yes Historical Provider, MD    Insulin Detemir (LEVEMIR FLEXPEN) 100 UNIT/ML Pen Inject 10 Units into the skin daily at 10 pm.   Yes Historical Provider, MD  ipratropium-albuterol (DUONEB) 0.5-2.5 (3) MG/3ML SOLN Take 3 mLs by nebulization every 8 (eight) hours. Reported on 03/27/2015   Yes Historical Provider, MD  metoprolol (LOPRESSOR) 100 MG tablet Take 1 tablet (100 mg total) by mouth 2 (two) times daily. 09/08/15  Yes Orson Eva, MD  montelukast (SINGULAIR) 10 MG tablet Take 1 tablet (10 mg total) by mouth at bedtime. 07/27/13  Yes Wardell Honour, MD  Multiple Vitamins-Minerals (PRESERVISION AREDS 2 PO) Take 1 tablet by mouth 2 (two) times daily.    Yes Historical Provider, MD  oxyCODONE (OXY IR/ROXICODONE) 5 MG immediate release tablet Take 1 tablet (5 mg total) by mouth every 4 (four) hours as needed for severe pain. 09/08/15  Yes Orson Eva, MD  pilocarpine (PILOCAR) 2 % ophthalmic solution Place 1 drop into both eyes 3 (three) times daily.   Yes Historical Provider, MD  polyethylene glycol (MIRALAX / GLYCOLAX) packet Take 17 g by mouth 2 (two) times daily.   Yes Historical Provider, MD  POTASSIUM PO Take 1 tablet by mouth daily. Dose: 40 MEQ   Yes Historical Provider, MD  predniSONE (DELTASONE) 1 MG tablet Take 2 mg by mouth daily with breakfast. IN CONJUNCTION WITH ONE 5 MG TABLET TO EQUAL A TOTAL OF 7 MILLIGRAMS   Yes Historical Provider, MD  predniSONE (DELTASONE) 5 MG tablet Take 5 mg by mouth daily with breakfast. IN CONJUNCTION WITH TWO 1 MG TABLETS TO EQUAL A TOTAL OF 7 MILLIGRAMS   Yes Historical Provider, MD  sennosides-docusate sodium (SENOKOT-S) 8.6-50 MG tablet Take 2 tablets by mouth 2 (two) times daily.   Yes Historical Provider, MD  sodium chloride 0.9 % nebulizer solution Take 3 mLs by nebulization every 6 (six) hours as needed for wheezing.   Yes Historical Provider, MD  Tamsulosin HCl (FLOMAX) 0.4 MG CAPS Take 0.4 mg by mouth at bedtime.    Yes Historical Provider, MD  traMADol (ULTRAM) 50 MG tablet Take 1  tablet (50 mg total) by mouth every 6 (six) hours as needed for moderate pain. 09/08/15  Yes Orson Eva, MD  traZODone (DESYREL) 100 MG tablet Take 1 tablet (100 mg total) by mouth at bedtime. 09/08/15  Yes David Tat, MD  vancomycin 500 mg in sodium chloride 0.9 % 100 mL Inject 500 mg into the vein every 12 (twelve) hours. Last day antibiotic on 09/26/15 09/09/15  Yes Orson Eva, MD  acidophilus (RISAQUAD) CAPS capsule Take 2 capsules by mouth daily. Patient not taking: Reported on 10/01/2015 08/09/15   Silver Huguenin Elgergawy, MD  cefTRIAXone 2 g in dextrose 5 % 50 mL Inject 2 g into the vein daily. Last day antibiotics on 09/26/15 Patient not taking: Reported on 10/01/2015 09/09/15   Orson Eva, MD  insulin NPH-regular Human (NOVOLIN 70/30) (70-30) 100 UNIT/ML injection Inject 20 Units into the skin 2 (two) times daily with a meal. Patient not taking: Reported on 10/01/2015 09/09/15   Orson Eva, MD  Liraglutide (VICTOZA) 18 MG/3ML SOPN Inject 1.2 mg into the skin daily at 3 pm.     Historical Provider, MD   Allergies  Allergen Reactions  . Ace  Inhibitors Other (See Comments)    Probably nausea and vomiting per patient   . Actonel [Risedronate] Nausea And Vomiting  . Ciprocinonide [Fluocinolone] Other (See Comments)    Probably nausea and vomiting per patient  . Flunisolide Other (See Comments)    Probably nausea and vomiting per patient   . Metformin And Related Other (See Comments)    Probably nausea and vomiting per patient   . Sertraline Other (See Comments)    Probably nausea and vomiting per patient   . Sulindac Other (See Comments)    Probably nausea and vomiting per patient   . Terazosin Other (See Comments)    Probably nausea and vomiting per patient     FAMILY HISTORY:  family history includes Hypertension in his other. SOCIAL HISTORY:  reports that he quit smoking about 58 years ago. He has never used smokeless tobacco. He reports that he does not drink alcohol or use illicit  drugs.  REVIEW OF SYSTEMS:   Unable as patient is encephalopathic   SUBJECTIVE:   VITAL SIGNS: Temp:  [102.9 F (39.4 C)] 102.9 F (39.4 C) (07/18 2235) Pulse Rate:  [99-168] 165 (07/19 0030) Resp:  [25-41] 27 (07/19 0030) BP: (100-141)/(56-94) 120/67 mmHg (07/19 0030) SpO2:  [97 %-98 %] 98 % (07/19 0030)  PHYSICAL EXAMINATION: General:  Obese male  Neuro:  Agitated, confused HEENT:  [place]/AT, PERRL, no JVD Cardiovascular:  Tachy, IRIR, rates 150s Lungs:  Clear bilateral breath sounds Abdomen:  Soft, generalized tenderness.  Musculoskeletal: No acute deformity or edema Skin:  Grossly intact   Recent Labs Lab 10/01/15 2145  NA 149*  K 2.9*  CL 114*  CO2 26  BUN 32*  CREATININE 2.33*  GLUCOSE 159*    Recent Labs Lab 10/01/15 2145  HGB 10.1*  HCT 31.0*  WBC 1.6*  PLT 218   Dg Chest Port 1 View  10/01/2015  CLINICAL DATA:  Fever EXAM: PORTABLE CHEST 1 VIEW COMPARISON:  Aug 05, 2015 FINDINGS: There is airspace consolidation throughout the left lower lobe with small left effusion. There is a small calcified granuloma in the right base. There is slight atelectasis in the right base. There is cardiomegaly pulmonary vascularity within normal limits. There is atherosclerotic calcification in the aorta. Patient is status post coronary artery bypass grafting. No pneumothorax. No adenopathy. No apparent bone lesions. IMPRESSION: Left lower lobe airspace consolidation consistent with pneumonia. Small left effusion. Mild right base atelectasis. Calcified granuloma right base. Stable cardiac prominence. There is aortic atherosclerosis. Followup PA and lateral chest radiographs recommended in 3-4 weeks following trial of antibiotic therapy to ensure resolution and exclude underlying malignancy. Electronically Signed   By: Lowella Grip III M.D.   On: 10/01/2015 21:53    ASSESSMENT / PLAN:  1. Sepsis - source unclear at this point. Low suspicion for HCAP based on imaging and  symptoms. Concern for recurrent infection at surgical site.  - Continue broad spectrum antibiotics  - Will need MRI of lumbar spine to better evaluate - Trend procalcitonin  2. Atrial fibrillation with RVR, likely secondary to #1. BP did not tolerate diltiazem - Telemetry monitoring on SDU - Amiodarone infusion with bolus - Hold outpatient antihypertensives  3. Acute metabolic encephalopathy. Also secondary to #1 - Hold sedating medications - Monitor closely on SDU  4. Acute kidney injury - Gentle volume, suspect this will resolve with volume and HR control - Repeat BMP in AM  5. Hypotension - secondary to hypovolemia and AF RVR - Gentle volume -  Not a candidate for pressors based on discussion with wife.   Georgann Housekeeper, AGACNP-BC [place] [facility] Pager [phone] or [phone]  10/02/2015 1:17 AM     STAFF NOTE: Linwood Dibbles, MD FACP have personally reviewed patient's available data, including medical history, events of note, physical examination and test results as part of my evaluation. I have discussed with resident/NP and other care providers such as pharmacist, RN and RRT. In addition, I personally evaluated patient and elicited key findings of: awake, confused, NO egophony or coarse ronchi on left base, back is clean without drainage but is tender, no abdo pain, fever noted, WBC leukopenia concerning for infection, sepsis, lactic less 2 and NO hypotension, fib rvr being driven by fever sepsis, source is unclear to me but I am NOT over whelmed that lung is source, uA noted also a concern as source, clearly he is hypovolemic, would fluid resuscitate, lactic reassuring, abg ordered also reassuring, would continued nosocomial ABX, vanc, zosyn, consider ID consult and MRI back repeat imaging, I have had extensive discussions with family wifeo n phone and son in person at bedside. We discussed patients current circumstances and organ failures. We also  discussed patient's prior wishes under circumstances such as this. Family has decided to NOT perform resuscitation if arrest or any forms life support but to continue current medical support for now. Would also start AMio as he drop BP with cardizem and control temp with pos balance, also with crt rise and dirty UA , would assess renal US, will sign off, call if needed   Lavon Paganini. Titus Mould, MD, [place] Pgr: [facility] 10/02/2015 1:17 AM

## 2015-10-02 NOTE — ED Notes (Signed)
Call wife at the home phone number if she is needed.

## 2015-10-02 NOTE — Telephone Encounter (Signed)
Patient brought to ER for sepsis, hold off Psych referral for depression for now. Thanks

## 2015-10-02 NOTE — Progress Notes (Signed)
Subjective:  Delirious but knows who I am. Admitted with sepsis and found to be in atrial fib with RVR.  BP soft.  Currently on amiodarone  But rate still increased.  Objective:  Vital Signs in the last 24 hours: BP 100/71 mmHg  Pulse 72  Temp(Src) 102.9 F (39.4 C) (Rectal)  Resp 22  SpO2 96%  Physical Exam: ELderly WM awake, but confused Lungs:  Clear Cardiac:  Rapid, irregular rhythm, normal S1 and S2, no S3 Extremities: 1+ edema present  Intake/Output from previous day: 07/18 0701 - 07/19 0700 In: 3 [I.V.:3] Out: -   Weight There were no vitals filed for this visit.  Lab Results: Basic Metabolic Panel:  Recent Labs  10/01/15 2145 10/02/15 0600  NA 149* 147*  K 2.9* 3.6  CL 114* 114*  CO2 26 22  GLUCOSE 159* 255*  BUN 32* 33*  CREATININE 2.33* 2.28*   CBC:  Recent Labs  10/01/15 2145 10/02/15 0600  WBC 1.6* 1.8*  NEUTROABS 0.1*  --   HGB 10.1* 9.1*  HCT 31.0* 28.1*  MCV 90.6 91.8  PLT 218 210   Cardiac Enzymes: Troponin (Point of Care Test)  Recent Labs  10/01/15 2138  TROPIPOC 2.03*   Cardiac Panel (last 3 results)  Recent Labs  10/02/15 0311  TROPONINI 0.90*    Telemetry: atrial fib with RVR still  Assessment/Plan:  1. Atrial fibrillation with RVR on IV amiodarone 2. Elevated troponin initially awaiting trend to determine if demand ischemia 3. CAD with prior CABG 4. Acute on chronic renal failure  REC:  Await troponin curves.  IV dig load  to help rate control.  Consider anticoagulation if no contraindications.        Kerry Hough  MD [facility]  10/02/2015, 9:01 AM

## 2015-10-02 NOTE — ED Notes (Signed)
Jacob [name] [phone] call with questions and needs.

## 2015-10-02 NOTE — Telephone Encounter (Signed)
PT's wife Adonis Huguenin called and said PT was admitted to [facility] and wanted to know if he can have his EEG done there/Dawn CB# [phone]

## 2015-10-02 NOTE — ED Notes (Signed)
RN called Jacob [name] [place], NP - pt crying out stating his legs are cramping; NP suggested calling hospitalist

## 2015-10-03 ENCOUNTER — Inpatient Hospital Stay (HOSPITAL_COMMUNITY): Payer: PPO

## 2015-10-03 ENCOUNTER — Other Ambulatory Visit: Payer: Self-pay

## 2015-10-03 DIAGNOSIS — A419 Sepsis, unspecified organism: Principal | ICD-10-CM

## 2015-10-03 DIAGNOSIS — G9340 Encephalopathy, unspecified: Secondary | ICD-10-CM

## 2015-10-03 LAB — BASIC METABOLIC PANEL
Anion gap: 8 (ref 5–15)
BUN: 38 mg/dL — ABNORMAL HIGH (ref 6–20)
CO2: 23 mmol/L (ref 22–32)
Calcium: 8.4 mg/dL — ABNORMAL LOW (ref 8.9–10.3)
Chloride: 113 mmol/L — ABNORMAL HIGH (ref 101–111)
Creatinine, Ser: 2.36 mg/dL — ABNORMAL HIGH (ref 0.61–1.24)
GFR calc Af Amer: 29 mL/min — ABNORMAL LOW (ref >60)
GFR calc non Af Amer: 25 mL/min — ABNORMAL LOW (ref >60)
Glucose, Bld: 269 mg/dL — ABNORMAL HIGH (ref 65–99)
Potassium: 3.3 mmol/L — ABNORMAL LOW (ref 3.5–5.1)
Sodium: 144 mmol/L (ref 135–145)

## 2015-10-03 LAB — GLUCOSE, CAPILLARY
Glucose-Capillary: 152 mg/dL — ABNORMAL HIGH (ref 65–99)
Glucose-Capillary: 204 mg/dL — ABNORMAL HIGH (ref 65–99)
Glucose-Capillary: 230 mg/dL — ABNORMAL HIGH (ref 65–99)
Glucose-Capillary: 231 mg/dL — ABNORMAL HIGH (ref 65–99)
Glucose-Capillary: 270 mg/dL — ABNORMAL HIGH (ref 65–99)
Glucose-Capillary: 280 mg/dL — ABNORMAL HIGH (ref 65–99)
Glucose-Capillary: 287 mg/dL — ABNORMAL HIGH (ref 65–99)

## 2015-10-03 LAB — URINE CULTURE: Culture: >=100000 — AB

## 2015-10-03 LAB — MAGNESIUM: Magnesium: 1.8 mg/dL (ref 1.7–2.4)

## 2015-10-03 LAB — PROTIME-INR
INR: 1.43 (ref 0.00–1.49)
Prothrombin Time: 17.5 seconds — ABNORMAL HIGH (ref 11.6–15.2)

## 2015-10-03 LAB — TROPONIN I
Troponin I: 0.64 ng/mL (ref <0.03)
Troponin I: 0.57 ng/mL (ref <0.03)

## 2015-10-03 MED ORDER — GADOBENATE DIMEGLUMINE 529 MG/ML IV SOLN
15.0000 mL | Freq: Once | INTRAVENOUS | Status: AC | PRN
  Administered 2015-10-03: 15 mL via INTRAVENOUS

## 2015-10-03 MED ORDER — LIDOCAINE-EPINEPHRINE 1 %-1:100000 IJ SOLN
INTRAMUSCULAR | Status: AC
  Filled 2015-10-03: qty 1

## 2015-10-03 MED ORDER — INSULIN ASPART 100 UNIT/ML ~~LOC~~ SOLN
0.0000 [IU] | Freq: Three times a day (TID) | SUBCUTANEOUS | Status: DC
  Administered 2015-10-03: 8 [IU] via SUBCUTANEOUS
  Administered 2015-10-04 (×2): 3 [IU] via SUBCUTANEOUS
  Administered 2015-10-05: 2 [IU] via SUBCUTANEOUS
  Administered 2015-10-07: 3 [IU] via SUBCUTANEOUS
  Administered 2015-10-07: 1 [IU] via SUBCUTANEOUS
  Administered 2015-10-07: 2 [IU] via SUBCUTANEOUS
  Administered 2015-10-08 – 2015-10-09 (×3): 3 [IU] via SUBCUTANEOUS
  Administered 2015-10-09: 5 [IU] via SUBCUTANEOUS
  Administered 2015-10-10: 3 [IU] via SUBCUTANEOUS

## 2015-10-03 MED ORDER — METOPROLOL TARTRATE 25 MG PO TABS
25.0000 mg | ORAL_TABLET | Freq: Three times a day (TID) | ORAL | Status: DC
  Administered 2015-10-03 – 2015-10-08 (×16): 25 mg via ORAL
  Filled 2015-10-03 (×16): qty 1

## 2015-10-03 MED ORDER — POTASSIUM CHLORIDE CRYS ER 20 MEQ PO TBCR
40.0000 meq | EXTENDED_RELEASE_TABLET | Freq: Every day | ORAL | Status: AC
  Administered 2015-10-03 – 2015-10-04 (×2): 40 meq via ORAL
  Filled 2015-10-03 (×2): qty 2

## 2015-10-03 MED ORDER — INSULIN GLARGINE 100 UNIT/ML ~~LOC~~ SOLN
14.0000 [IU] | Freq: Every day | SUBCUTANEOUS | Status: DC
  Administered 2015-10-03 – 2015-10-04 (×2): 14 [IU] via SUBCUTANEOUS
  Filled 2015-10-03 (×3): qty 0.14

## 2015-10-03 NOTE — Consult Note (Signed)
Chief Complaint: Patient was seen in consultation today for paraspinal collection aspiration Chief Complaint  Patient presents with  . Atrial Fibrillation  . Fever   at the request of Dr Scharlene Gloss Dr Venia Minks  Referring Physician(s): Dr Venia Minks  Supervising Physician: Sandi Mariscal  Patient Status: Inpatient  History of Present Illness: Jacob [name] is a 75 y.o. male   Back pain Encephalitis Lumbar disc surgery in May and June 2017 Has been on antibiotics for almost 1 month Admitted from SNF with AMS MRI 7/19: IMPRESSION: Status post redo interval RIGHT L3 hemilaminectomy for resection of disc extrusion with minimal residual suspected component, ventral epidural hematoma and/or granulation tissue.  1.4 x 2.5 x 3.3 cm rim enhancing paraspinal fluid collection concerning for abscess. 7 x 15 mm fluid collection in RIGHT laminectomy defect equivocal for abscess, and could represent seroma or possibly hematoma.  No convincing evidence of discitis osteomyelitis.  Similar degenerative lumbar spine resulting in mild canal stenosis L3-4 and L4-5. Neural foraminal narrowing L3-4 through L5-S1: Moderate to severe at L3-4 and L5-S1  Request from [facility] and ID for paraspinal collection aspiration Dr Pascal Lux has reviewed imaging and approves procedure   Past Medical History  Diagnosis Date  . Allergy   . Asthma   . Diabetes mellitus without complication ([place])   . Cataract   . Arthritis   . Polymyalgia rheumatica (HCC)     maintained on Prednisone, Plaquenil. Followed by rhuematology every 4 months/[name].  Marland Kitchen COPD (chronic obstructive pulmonary disease) ([place])   . Anemia   . Hypertension   . BPH (benign prostatic hyperplasia)   . Sleep apnea     CPAP   Trying to use  . Shortness of breath dyspnea     with exertion  . Anxiety   . Chronic kidney disease     chronic  kidney failure  kidney function at 42%  . Coronary artery disease     CABG  7 bypasses  .  Hepatitis     many years ago  . Cancer of kidney [facility])     Past Surgical History  Procedure Laterality Date  . Appendectomy    . Hernia repair    . Prostate surgery      TURP at [facility].  Marland Kitchen Coronary artery bypass graft  03/17/1995    Wynonia Lawman; followed every six months.  . Cataract extraction w/phaco Right 11/28/2014    Procedure: CATARACT EXTRACTION PHACO AND INTRAOCULAR LENS PLACEMENT (IOC) RIGHT ;  Surgeon: Marylynn Pearson, MD;  Location: [place];  Service: Ophthalmology;  Laterality: Right;  . Lumbar laminectomy/decompression microdiscectomy N/A 07/30/2015    Procedure: LUMBAR LAMINECTOMY DISCECTOMY ;  Surgeon: Ashok Pall, MD;  Location: [place] NEURO ORS;  Service: Neurosurgery;  Laterality: N/A;  LUMBAR LAMINECTOMY DISCECTOMY   . Lumbar laminectomy/decompression microdiscectomy N/A 09/06/2015    Procedure: Redo L3/4 Disectomy;  Surgeon: Ashok Pall, MD;  Location: [place] NEURO ORS;  Service: Neurosurgery;  Laterality: N/A;    Allergies: Ace inhibitors; Actonel; Ciprocinonide; Flunisolide; Metformin and related; Sertraline; Sulindac; and Terazosin  Medications: Prior to Admission medications   Medication Sig Start Date End Date Taking? Authorizing Provider  acetaminophen (TYLENOL) 325 MG tablet Take 2 tablets (650 mg total) by mouth every 6 (six) hours as needed for mild pain (or Fever >/= 101). 08/09/15  Yes Silver Huguenin Elgergawy, MD  albuterol (PROVENTIL HFA;VENTOLIN HFA) 108 (90 BASE) MCG/ACT inhaler Inhale 2 puffs into the lungs every 6 (six) hours as needed for  wheezing or shortness of breath. 07/16/13  Yes Wardell Honour, MD  amLODipine (NORVASC) 10 MG tablet Take 10 mg by mouth daily with supper.    Yes Historical Provider, MD  aspirin EC 81 MG tablet Take 81 mg by mouth daily with lunch.   Yes Historical Provider, MD  atorvastatin (LIPITOR) 20 MG tablet Take 1 tablet (20 mg total) by mouth daily at 6 PM. 09/08/15  Yes Orson Eva, MD  Brinzolamide-Brimonidine [facility]) 1-0.2 % SUSP Place 1 drop into  both eyes 2 (two) times daily.    Yes Historical Provider, MD  budesonide-formoterol (SYMBICORT) 160-4.5 MCG/ACT inhaler Inhale 2 puffs into the lungs 2 (two) times daily.   Yes Historical Provider, MD  calcitRIOL (ROCALTROL) 0.25 MCG capsule Take 0.25 mcg by mouth every Monday, Wednesday, and Friday.   Yes Historical Provider, MD  cycloSPORINE (RESTASIS) 0.05 % ophthalmic emulsion Place 1 drop into both eyes 2 (two) times daily.   Yes Historical Provider, MD  ergocalciferol (VITAMIN D2) 50000 units capsule Take 50,000 Units by mouth See admin instructions. Take one tablet by mouth twice a week on Saturday and Wednesday   Yes Historical Provider, MD  feeding supplement, GLUCERNA SHAKE, (GLUCERNA SHAKE) LIQD Take 237 mLs by mouth 3 (three) times daily between meals. 08/09/15  Yes Silver Huguenin Elgergawy, MD  finasteride (PROSCAR) 5 MG tablet Take 5 mg by mouth at bedtime.    Yes Historical Provider, MD  Insulin Detemir (LEVEMIR FLEXPEN) 100 UNIT/ML Pen Inject 10 Units into the skin daily at 10 pm.   Yes Historical Provider, MD  ipratropium-albuterol (DUONEB) 0.5-2.5 (3) MG/3ML SOLN Take 3 mLs by nebulization every 8 (eight) hours. Reported on 03/27/2015   Yes Historical Provider, MD  metoprolol (LOPRESSOR) 100 MG tablet Take 1 tablet (100 mg total) by mouth 2 (two) times daily. 09/08/15  Yes Orson Eva, MD  montelukast (SINGULAIR) 10 MG tablet Take 1 tablet (10 mg total) by mouth at bedtime. 07/27/13  Yes Wardell Honour, MD  Multiple Vitamins-Minerals (PRESERVISION AREDS 2 PO) Take 1 tablet by mouth 2 (two) times daily.    Yes Historical Provider, MD  oxyCODONE (OXY IR/ROXICODONE) 5 MG immediate release tablet Take 1 tablet (5 mg total) by mouth every 4 (four) hours as needed for severe pain. 09/08/15  Yes Orson Eva, MD  pilocarpine (PILOCAR) 2 % ophthalmic solution Place 1 drop into both eyes 3 (three) times daily.   Yes Historical Provider, MD  polyethylene glycol (MIRALAX / GLYCOLAX) packet Take 17 g by mouth 2  (two) times daily.   Yes Historical Provider, MD  POTASSIUM PO Take 1 tablet by mouth daily. Dose: 40 MEQ   Yes Historical Provider, MD  predniSONE (DELTASONE) 1 MG tablet Take 2 mg by mouth daily with breakfast. IN CONJUNCTION WITH ONE 5 MG TABLET TO EQUAL A TOTAL OF 7 MILLIGRAMS   Yes Historical Provider, MD  predniSONE (DELTASONE) 5 MG tablet Take 5 mg by mouth daily with breakfast. IN CONJUNCTION WITH TWO 1 MG TABLETS TO EQUAL A TOTAL OF 7 MILLIGRAMS   Yes Historical Provider, MD  sennosides-docusate sodium (SENOKOT-S) 8.6-50 MG tablet Take 2 tablets by mouth 2 (two) times daily.   Yes Historical Provider, MD  sodium chloride 0.9 % nebulizer solution Take 3 mLs by nebulization every 6 (six) hours as needed for wheezing.   Yes Historical Provider, MD  Tamsulosin HCl (FLOMAX) 0.4 MG CAPS Take 0.4 mg by mouth at bedtime.    Yes Historical Provider, MD  traMADol (ULTRAM) 50 MG tablet Take 1 tablet (50 mg total) by mouth every 6 (six) hours as needed for moderate pain. 09/08/15  Yes Orson Eva, MD  traZODone (DESYREL) 100 MG tablet Take 1 tablet (100 mg total) by mouth at bedtime. 09/08/15  Yes David Tat, MD  vancomycin 500 mg in sodium chloride 0.9 % 100 mL Inject 500 mg into the vein every 12 (twelve) hours. Last day antibiotic on 09/26/15 09/09/15  Yes Orson Eva, MD  acidophilus (RISAQUAD) CAPS capsule Take 2 capsules by mouth daily. Patient not taking: Reported on 10/01/2015 08/09/15   Silver Huguenin Elgergawy, MD  cefTRIAXone 2 g in dextrose 5 % 50 mL Inject 2 g into the vein daily. Last day antibiotics on 09/26/15 Patient not taking: Reported on 10/01/2015 09/09/15   Orson Eva, MD  insulin NPH-regular Human (NOVOLIN 70/30) (70-30) 100 UNIT/ML injection Inject 20 Units into the skin 2 (two) times daily with a meal. Patient not taking: Reported on 10/01/2015 09/09/15   Orson Eva, MD  Liraglutide (VICTOZA) 18 MG/3ML SOPN Inject 1.2 mg into the skin daily at 3 pm.     Historical Provider, MD     Family History    Problem Relation Age of Onset  . Hypertension Other     Social History   Social History  . Marital Status: Married    Spouse Name: N/A  . Number of Children: N/A  . Years of Education: N/A   Occupational History  . Driver    Social History Main Topics  . Smoking status: Former Smoker    Quit date: 03/16/1957  . Smokeless tobacco: Never Used  . Alcohol Use: No  . Drug Use: No  . Sexual Activity: Yes    Birth Control/ Protection: None   Other Topics Concern  . None   Social History Narrative   Marital status: Married x 55 years.      Children: 3 children; 5 grandchildren; 6 gg.      Lives: with wife.        Employment: retired; [employer].  Working every other week; 45 hours.      Tobacco:  Quit at age 59.      Alcohol:  None      Exercise: none      ADLs: indepedent with ADLs;  Uses cane and has a walker.  Drives.      Advanced Directives:  +Living Will.  DNR/DNI.  Wife is HCPOA.     Review of Systems: A 12 point ROS discussed and pertinent positives are indicated in the HPI above.  All other systems are negative.  Review of Systems  Constitutional: Positive for fever, activity change and appetite change.  Respiratory: Negative for shortness of breath.   Musculoskeletal: Positive for back pain and gait problem.  Psychiatric/Behavioral: Positive for behavioral problems and confusion.    Vital Signs: BP 141/76 mmHg  Pulse 97  Temp(Src) 98.1 F (36.7 C) (Axillary)  Resp 18  Ht 5\' 11"  (1.803 m)  Wt 195 lb 5.2 oz (88.6 kg)  BMI 27.25 kg/m2  SpO2 100%  Physical Exam  Constitutional:  Groggy Confused Does not follow all commands  Cardiovascular: Normal rate and regular rhythm.   Pulmonary/Chest: Effort normal and breath sounds normal.  Abdominal: Soft. Bowel sounds are normal.  Musculoskeletal:  Does not follow commands moving all 4s  Neurological:  Confused Wife at bedside  Skin: Skin is warm.  Psychiatric:  Consented with  wife  Nursing note  and vitals reviewed.   Mallampati Score:  MD Evaluation Airway: WNL Heart: WNL Abdomen: WNL Chest/ Lungs: WNL ASA  Classification: 3 Mallampati/Airway Score: Two  Imaging: Mr Lumbar Spine W Wo Contrast  10/03/2015  CLINICAL DATA:  Status post lumbar spine surgery September 06, 2015, recent diagnosis of discitis, sepsis. History of diabetes, hypertension. EXAM: MRI LUMBAR SPINE WITHOUT AND WITH CONTRAST TECHNIQUE: Multiplanar and multiecho pulse sequences of the lumbar spine were obtained without and with intravenous contrast. CONTRAST:  32mL MULTIHANCE GADOBENATE DIMEGLUMINE 529 MG/ML IV SOLN COMPARISON:  MRI of the lumbar spine August 29, 2015 and intraoperative radiograph of the lumbar spine September 06, 2015 FINDINGS: SEGMENTATION: For the purposes of this report, the last well-formed intervertebral disc will be described as L5-S1. ALIGNMENT: No malalignment.  Maintenance of the lumbar lordosis. VERTEBRAE:Patient is status post redo RIGHT L3 hemilaminectomy. L4 superior endplate Schmorl's node with acute on chronic discogenic endplate changes and enhancement. Progressed L3-4 disc height loss with minimal residual intradiscal residuals bright STIR signal. Moderate to severe L4-5 and L5-S1 disc height loss and subacute on chronic discogenic endplate changes. CONUS MEDULLARIS: Conus medullaris terminates at L1-2 and demonstrates normal morphology and signal characteristics. Cauda equina is normal. No abnormal cord, leptomeningeal enhancement. PARASPINAL AND SOFT TISSUES: 1.4 x 2.5 x 3.3 cm (transverse by AP by CC) rim enhancing fluid collection within the paraspinal central soft tissues, within 2.1 cm the skin surface at the surgical approach. 7 x 15 mm fluid collection within the RIGHT laminectomy surgical bed extending to the dorsal epidural space. DISC LEVELS: L1-2 and L2-3: No disc bulge, canal stenosis nor neural foraminal narrowing. L3-4: Enhancing RIGHT ventral epidural soft tissue  with 14 mm of superior migration most compatible with hematoma and/or granulation tissue with 3 mm RIGHT central residual versus recurrent disc extrusion. Status post RIGHT laminectomy. Mild canal stenosis with effaced upper RIGHT lateral recess which may affect the traversing RIGHT L3 nerve. Moderate facet arthropathy. Moderate to severe bilateral neural foraminal narrowing. Encroachment upon the exited L3 nerves is similar. L4-5: Small broad-based disc bulge. Moderate facet arthropathy and ligamentum flavum redundancy. Mild canal stenosis and encroachment upon the exited L4 nerves. Mild to moderate RIGHT, mild LEFT neural foraminal narrowing. L5-S1: 5 mm broad-based central disc protrusion and annular bulging may affect the exited RIGHT greater LEFT L5 nerve. Mild facet arthropathy and ligamentum flavum redundancy without canal stenosis. Moderate to severe RIGHT, mild LEFT neural foraminal narrowing. IMPRESSION: Status post redo interval RIGHT L3 hemilaminectomy for resection of disc extrusion with minimal residual suspected component, ventral epidural hematoma and/or granulation tissue. 1.4 x 2.5 x 3.3 cm rim enhancing paraspinal fluid collection concerning for abscess. 7 x 15 mm fluid collection in RIGHT laminectomy defect equivocal for abscess, and could represent seroma or possibly hematoma. No convincing evidence of discitis osteomyelitis. Similar degenerative lumbar spine resulting in mild canal stenosis L3-4 and L4-5. Neural foraminal narrowing L3-4 through L5-S1: Moderate to severe at L3-4 and L5-S1. Electronically Signed   By: Elon Alas M.D.   On: 10/03/2015 00:49   Dg Lumbar Spine 1 View  09/06/2015  CLINICAL DATA:  LUMBAR LAMINECTOMY/DECOMPRESSION MICRODISCECTOMY 3/4 EXAM: LUMBAR SPINE - 1 VIEW COMPARISON:  MR lumbar spine 08/29/2015 FINDINGS: Single lateral intraoperative view of lumbar spine. Using the numbering convention of comparison MRI, there is a current tip probe posterior to the L3  vertebral body. IMPRESSION: Intraoperative view as appear Electronically Signed   By: Suzy Bouchard M.D.   On: 09/06/2015 21:06  US Renal  10/03/2015  CLINICAL DATA:  Acute kidney injury EXAM: RENAL / URINARY TRACT ULTRASOUND COMPLETE COMPARISON:  02/25/2015 outside abdominal CT [facility]) FINDINGS: Right Kidney: Length: 12 cm. No hydronephrosis. There was a right lower pole enhancing mass on 02/25/2015 CT. Interval percutaneous ablation. No findings in this region. Left Kidney: Length: 12 cm. Echogenicity within normal limits. No mass or hydronephrosis visualized. Bladder: Apparent dependent echoes in the bladder appear artifactual. Moderate distension of the bladder without wall thickening. IMPRESSION: 1. No hydronephrosis. 2. No findings in region of treated right renal cell carcinoma. Electronically Signed   By: Monte Fantasia M.D.   On: 10/03/2015 03:46   Dg Chest Port 1 View  10/01/2015  CLINICAL DATA:  Fever EXAM: PORTABLE CHEST 1 VIEW COMPARISON:  Aug 05, 2015 FINDINGS: There is airspace consolidation throughout the left lower lobe with small left effusion. There is a small calcified granuloma in the right base. There is slight atelectasis in the right base. There is cardiomegaly pulmonary vascularity within normal limits. There is atherosclerotic calcification in the aorta. Patient is status post coronary artery bypass grafting. No pneumothorax. No adenopathy. No apparent bone lesions. IMPRESSION: Left lower lobe airspace consolidation consistent with pneumonia. Small left effusion. Mild right base atelectasis. Calcified granuloma right base. Stable cardiac prominence. There is aortic atherosclerosis. Followup PA and lateral chest radiographs recommended in 3-4 weeks following trial of antibiotic therapy to ensure resolution and exclude underlying malignancy. Electronically Signed   By: Lowella Grip III M.D.   On: 10/01/2015 21:53    Labs:  CBC:  Recent Labs  09/07/15 1128  09/08/15 0630 10/01/15 2145 10/02/15 0600  WBC 8.6 9.1 1.6* 1.8*  HGB 10.3* 10.1* 10.1* 9.1*  HCT 32.0* 31.9* 31.0* 28.1*  PLT 255 237 218 210    COAGS:  Recent Labs  07/25/15 0005 08/30/15 1317  INR 1.06 1.02  APTT 31  --     BMP:  Recent Labs  09/09/15 1315 10/01/15 2145 10/02/15 0600 10/03/15 0725  NA 141 149* 147* 144  K 3.5 2.9* 3.6 3.3*  CL 107 114* 114* 113*  CO2 26 26 22 23   GLUCOSE 197* 159* 255* 269*  BUN 15 32* 33* 38*  CALCIUM 9.4 9.2 8.2* 8.4*  CREATININE 1.41* 2.33* 2.28* 2.36*  GFRNONAA 47* 26* 26* 25*  GFRAA 55* 30* 31* 29*    LIVER FUNCTION TESTS:  Recent Labs  09/02/15 0644 09/06/15 0559 10/01/15 2145 10/02/15 0600  BILITOT 0.7 0.9 1.7* 1.4*  AST 38 35 14* 12*  ALT 51 58 14* 13*  ALKPHOS 348* 245* 117 99  PROT 6.2* 5.7* 5.3* 4.5*  ALBUMIN 2.8* 2.7* 2.5* 2.1*    TUMOR MARKERS: No results for input(s): AFPTM, CEA, CA199, CHROMGRNA in the last 8760 hours.  Assessment and Plan:  Lumbar surgery May and June 2017 Discitis and treatment x 1 mo Now with severe worsening back pain Fever AMS MRI reveals paraspinal collection Now scheduled for aspiration  Risks and Benefits discussed with the patient including, but not limited to bleeding, infection, damage to adjacent structures or low yield requiring additional tests. All of the patient's questions were answered, patient is agreeable to proceed. Consent signed and in chart.  Pt has has 1 teaspoon of apple sauce today 30 minutes ago Ok to proceed without sedation and only numbing meds per wife  Thank you for this interesting consult.  I greatly enjoyed meeting DESHON VERHOEVEN and look forward to participating in their care.  A copy of this report was sent to the requesting provider on this date.  Electronically Signed: Monia Sabal A 10/03/2015, 3:15 PM   I spent a total of 40 Minutes    in face to face in clinical consultation, greater than 50% of which was  counseling/coordinating care for paraspinal abscess aspiration

## 2015-10-03 NOTE — Evaluation (Signed)
Clinical/Bedside Swallow Evaluation Patient Details  Name: Jacob [name] MRN: [name] Date of Birth: [birth year]  Today's Date: 10/03/2015 Time: SLP Start Time (ACUTE ONLY): 1000 SLP Stop Time (ACUTE ONLY): 1023 SLP Time Calculation (min) (ACUTE ONLY): 23 min  Past Medical History:  Past Medical History  Diagnosis Date  . Allergy   . Asthma   . Diabetes mellitus without complication ([place])   . Cataract   . Arthritis   . Polymyalgia rheumatica (HCC)     maintained on Prednisone, Plaquenil. Followed by rhuematology every 4 months/[name].  Marland Kitchen COPD (chronic obstructive pulmonary disease) ([place])   . Anemia   . Hypertension   . BPH (benign prostatic hyperplasia)   . Sleep apnea     CPAP   Trying to use  . Shortness of breath dyspnea     with exertion  . Anxiety   . Chronic kidney disease     chronic  kidney failure  kidney function at 42%  . Coronary artery disease     CABG  7 bypasses  . Hepatitis     many years ago  . Cancer of kidney [facility])    Past Surgical History:  Past Surgical History  Procedure Laterality Date  . Appendectomy    . Hernia repair    . Prostate surgery      TURP at [facility].  Marland Kitchen Coronary artery bypass graft  03/17/1995    Wynonia Lawman; followed every six months.  . Cataract extraction w/phaco Right 11/28/2014    Procedure: CATARACT EXTRACTION PHACO AND INTRAOCULAR LENS PLACEMENT (IOC) RIGHT ;  Surgeon: Marylynn Pearson, MD;  Location: [place];  Service: Ophthalmology;  Laterality: Right;  . Lumbar laminectomy/decompression microdiscectomy N/A 07/30/2015    Procedure: LUMBAR LAMINECTOMY DISCECTOMY ;  Surgeon: Ashok Pall, MD;  Location: [place] [facility]URO ORS;  Service: Neurosurgery;  Laterality: N/A;  LUMBAR LAMINECTOMY DISCECTOMY   . Lumbar laminectomy/decompression microdiscectomy N/A 09/06/2015    Procedure: Redo L3/4 Disectomy;  Surgeon: Ashok Pall, MD;  Location: [place] NEURO ORS;  Service: Neurosurgery;  Laterality: N/A;   HPI:  75 y.o. male with medical history significant of  CAD, CABG, IDDM, COPD, dyslipidemia, RCC, PMR, HTN, CKD, OSA , lumbar stenosis s/p correction, and recently was hospitalized with discitis , was on rocephin and vancomycin till 7/13, presents for altered mental status.  Dx sepsis, acute kidney injury.     Assessment / Plan / Recommendation Clinical Impression  Pt with poor oral awareness/anticipation of POs with max verbal/tactile cues needed to attend to and consume food/liquids.  No overt s/s of aspiration, but not sufficiently alert to eat safely.  POs removed manually from mouth.  Daughter reports sedation earlier today in order to tolerate MRI.  Hold food tray for now; SLP will return this afternoon to determine readiness.  Dtr agrees.     Aspiration Risk  Mild aspiration risk    Diet Recommendation   Hold meal tray until more alert  Medication Administration: Whole meds with puree    Other  Recommendations Oral Care Recommendations: Oral care BID   Follow up Recommendations   (tba)    Frequency and Duration min 2x/week  1 week       Prognosis        Swallow Study   General HPI: 75 y.o. male with medical history significant of CAD, CABG, IDDM, COPD, dyslipidemia, RCC, PMR, HTN, CKD, OSA , lumbar stenosis s/p correction, and recently was hospitalized with discitis , was on rocephin and vancomycin till 7/13, presents  for altered mental status.  Dx sepsis, acute kidney injury.   Type of Study: Bedside Swallow Evaluation Previous Swallow Assessment: Bedside swallow eval May 2017 with generally normal findings re: oropharyngeal function, suspected primary esophageal dysphagia. Diet Prior to this Study: Regular;Thin liquids Temperature Spikes Noted: No Respiratory Status: Nasal cannula History of Recent Intubation: No Behavior/Cognition: Lethargic/Drowsy Oral Cavity Assessment: Within Functional Limits Oral Care Completed by SLP: No Oral Cavity - Dentition: Edentulous Vision: Functional for self-feeding Self-Feeding Abilities:  Needs assist Patient Positioning: Upright in bed Baseline Vocal Quality: Normal Volitional Cough: Strong Volitional Swallow: Able to elicit    Oral/Motor/Sensory Function Overall Oral Motor/Sensory Function: Within functional limits   Ice Chips Ice chips: Not tested   Thin Liquid Thin Liquid: Impaired Presentation: Cup Oral Phase Impairments: Poor awareness of bolus;Reduced labial seal Oral Phase Functional Implications: Right anterior spillage;Left anterior spillage;Prolonged oral transit Pharyngeal  Phase Impairments: Suspected delayed Swallow    Nectar Thick Nectar Thick Liquid: Not tested   Honey Thick Honey Thick Liquid: Not tested   Puree Puree: Impaired Presentation: Spoon Oral Phase Impairments: Reduced labial seal;Poor awareness of bolus   Solid   GO   Solid: Not tested       Jacob [name] L. Tivis Ringer, [place] CCC/SLP Pager [phone]  [name] 10/03/2015,10:30 AM

## 2015-10-03 NOTE — Telephone Encounter (Signed)
[name] given instructions per Dr. Delice Lesch.

## 2015-10-03 NOTE — Telephone Encounter (Signed)
Pls let wife know that unless they think he needs it while he is there, I would recommend he have it when he is better from the heart issues and back to baseline so we get a true baseline EEG. Thanks

## 2015-10-03 NOTE — Consult Note (Signed)
   [facility] CM Inpatient Consult   10/03/2015  Jacob [name] Jul 03, 1940 [name]  Patient screened for potential [place] Management services. Patient is eligible for [facility] Care Management services under patient's [employer] Medicare  plan. Chart review reveals patient is confused.  Following for potential needs.   Please place a [facility] Care Management consult or for questions contact:   Natividad Brood, RN BSN [facility] Liaison  [phone] business mobile phone Toll free office [phone]

## 2015-10-03 NOTE — Progress Notes (Addendum)
Subjective:  He is delirious this morning does not know who I am, mumbling speech, blood pressure is under better control.  Objective:  Vital Signs in the last 24 hours: BP 149/75 mmHg  Pulse 126  Temp(Src) 97.6 F (36.4 C) (Oral)  Resp 18  Ht 5\' 11"  (1.803 m)  Wt 88.6 kg (195 lb 5.2 oz)  BMI 27.25 kg/m2  SpO2 100%  Physical Exam: Somewhat confused male mumbling speech, lying in bed but in no acute distress Lungs:  Clear Cardiac:  Rapid irregular rhythm, normal S1 and S2, no S3 Abdomen:  Soft, nontender, no masses Extremities:  No edema present  Intake/Output from previous day: 07/19 0701 - 07/20 0700 In: 568.4 [I.V.:468.4; IV Piggyback:100] Out: -   Weight Filed Weights   10/02/15 0950 10/03/15 0522  Weight: 87.7 kg (193 lb 5.5 oz) 88.6 kg (195 lb 5.2 oz)    Lab Results: Basic Metabolic Panel:  Recent Labs  10/02/15 0600 10/03/15 0725  NA 147* 144  K 3.6 3.3*  CL 114* 113*  CO2 22 23  GLUCOSE 255* 269*  BUN 33* 38*  CREATININE 2.28* 2.36*   CBC:  Recent Labs  10/01/15 2145 10/02/15 0600  WBC 1.6* 1.8*  NEUTROABS 0.1*  --   HGB 10.1* 9.1*  HCT 31.0* 28.1*  MCV 90.6 91.8  PLT 218 210   Cardiac Enzymes: Troponin (Point of Care Test)  Recent Labs  10/01/15 2138  TROPIPOC 2.03*   Cardiac Panel (last 3 results)  Recent Labs  10/02/15 0311 10/02/15 0948 10/02/15 1424  TROPONINI 0.90* 1.02* 0.86*    Telemetry: Atrial fibrillation rate is still somewhat rapid  Assessment/Plan:  1.  Paroxysmal atrial fibrillation rate still not well controlled 2.  Coronary artery disease with previous bypass grafting 3.  Elevated troponin suggestive of demand ischemia likely due to rapid atrial fibrillation and sepsis 4.  Probable sepsis with possible abscess around previous spinal operation site 5.  Delirium  Recommendations:  With improvement in blood pressure we'll try to add beta blocker to her regimen.  Continue intravenous amiodarone.  If blood  pressure continues to be soft I would probably give some additional IV digoxin if the beta blocker does not helped control the rate.     Kerry Hough  MD [facility] Cardiology  10/03/2015, 8:45 AM

## 2015-10-03 NOTE — Progress Notes (Signed)
RN escorted patent to MRI and ultrasound per MD orders. Patient daughter went as well per patient request. Patient was given IV ativan as ordered prior to transport to MRI. Once in MRI and patient had be awaken, patient was confused and moaning with facial grimacing. [place] NP for triad text paged and new order for ativan given. Ativan and fentanyl was given in MRI and patient was then able to allow MRI and ultra sound to be completed. Assigned RN remained with patient the while test were being completed.

## 2015-10-03 NOTE — Progress Notes (Signed)
Inpatient Diabetes Program Recommendations  AACE/ADA: New Consensus Statement on Inpatient Glycemic Control (2015)  Target Ranges:  Prepandial:   less than 140 mg/dL      Peak postprandial:   less than 180 mg/dL (1-2 hours)      Critically ill patients:  140 - 180 mg/dL   Results for Jacob [name], Jacob [name] (MRN [name]) as of 10/03/2015 10:09  Ref. Range 10/02/2015 04:06 10/02/2015 10:06 10/02/2015 13:40 10/02/2015 16:10 10/02/2015 20:42 10/03/2015 01:06 10/03/2015 05:10 10/03/2015 08:04  Glucose-Capillary Latest Ref Range: 65-99 mg/dL 261 (H) 290 (H) 312 (H) 319 (H) 248 (H) 280 (H) 287 (H) 230 (H)   Review of Glycemic Control  Diabetes history: DM2 Outpatient Diabetes medications: Levemir 10 units QHS, Victoza 1.2 mg daily Current orders for Inpatient glycemic control: Lantus 10 units QHS, Novolog 0-9 units Q4H  Inpatient Diabetes Program Recommendations: Insulin - Basal: Please consider increasing Lantus to 15 units QHS. Correction (SSI): Please consider increasing Novolog correction to moderate correction scale.  Thanks, Barnie Alderman, RN, MSN, CDE Diabetes Coordinator Inpatient Diabetes Program [phone] (Team Pager from [place] to [place]) [phone] ([place] office) [phone] [facility] office) [phone] [facility] office)

## 2015-10-03 NOTE — Progress Notes (Signed)
Patient ID: Jacob [name], male   DOB: [birth year], 75 y.o.   MRN: [id number] BP 122/77 mmHg  Pulse 97  Temp(Src) 98.8 F (37.1 C) (Oral)  Resp 21  Ht 5\' 11"  (1.803 m)  Wt 88.6 kg (195 lb 5.2 oz)  BMI 27.25 kg/m2  SpO2 98% Initial gram stain without organisms Wound looks good.  Will follow

## 2015-10-03 NOTE — Procedures (Signed)
Technically successful CT guided aspiration of approximately 2 cc of blood tingled cloudy fluid from fluid collection within the subcutaneous tissues posterior to the Lx operative site.    All aspirated samples sent to the laboratory for analysis.    EBL: None  No immediate post procedural complications.   Ronny Bacon, MD Pager #: [phone]

## 2015-10-03 NOTE — Clinical Documentation Improvement (Signed)
Hospitalist  Can the diagnosis of CKD be further specified?   CKD Stage I - GFR greater than or equal to 90  CKD Stage II - GFR 60-89  CKD Stage III - GFR 30-59  CKD Stage IV - GFR 15-29  CKD Stage V - GFR < 15  ESRD (End Stage Renal Disease)  Other condition  Unable to clinically determine   Supporting Information: : (risk factors, signs and symptoms, diagnostics, treatment) Acute kidney injury on chronic kidney disease per 7/19 progress notes.  Labs:  Bun  Creat  GFR: 7/20:   38     2.36     25 7/19:   33     2.28     26  Please exercise your independent, professional judgment when responding. A specific answer is not anticipated or expected.   Thank You, [place] [phone]

## 2015-10-03 NOTE — Consult Note (Signed)
[place] for Infectious Disease    Date of Admission:  10/01/2015   Total days of antibiotics 2               Reason for Consult: Fever and encephalitis    Referring Physician: Hosie Poisson Primary Care Physician: Not documented  Principal Problem:   Sepsis [facility]) Active Problems:   Acute encephalopathy   Atrial fibrillation with RVR ([place])   CHF with left ventricular diastolic dysfunction, NYHA class 2 (HCC)   Elevated troponin   Acute kidney injury superimposed on chronic kidney disease ([place])   Hypotension   Diabetes mellitus, type 2 ([place])   . antiseptic oral rinse  7 mL Mouth Rinse q12n4p  . chlorhexidine  15 mL Mouth Rinse BID  . Chlorhexidine Gluconate Cloth  6 each Topical Q0600  . cycloSPORINE  1 drop Both Eyes BID  . finasteride  5 mg Oral QHS  . heparin  5,000 Units Subcutaneous Q8H  . insulin aspart  0-9 Units Subcutaneous Q4H  . insulin glargine  10 Units Subcutaneous QHS  . metoprolol tartrate  25 mg Oral TID  . mometasone-formoterol  2 puff Inhalation BID  . mupirocin ointment  1 application Nasal BID  . pilocarpine  1 drop Both Eyes TID  . piperacillin-tazobactam (ZOSYN)  IV  3.375 g Intravenous Q8H  . potassium chloride  40 mEq Oral Daily  . predniSONE  7 mg Oral Q breakfast  . sodium chloride flush  3 mL Intravenous Q12H  . tamsulosin  0.4 mg Oral QHS    Recommendations: 1. Discontinue vancomycin and azithromycin 2. Recommend neurosurgery to evaluate paraspinal fluid collections for possible aspiration  Assessment: Acute encephalopathy: The cause of Jacob [name] fever and confusion is still uncertain at this time. MRI findings of new paraspinal soft tissue fluid collection are concerning for a paraspinal abscess vs hematoma or seroma and aspiration would be of therapeutic and diagnostic value here. He is also at risk for bloodstream infection due to indwelling PICC for 4 weeks if an organism is not susceptible to the treatment he was on.  Meningitis is unlikely with the lack of symptoms at this time. He could have infectious encephalitis but I would not proceed to LP or repeat head imaging until other causes have been investigated. He is on chronic prednisone and plaquenil but not heavily immunosuppressed to risk opportunistic infections. Based on chest imaging and symptoms pneumonia is very unlikely so we can stop empiric treatment for atypical respiratory infection. He has right upper quadrant pain but LFTs do not suggest a biliary obstruction.  Neutropenia: Absolute neutrophil count of 100 after normal values a month ago. His month of antibiotics is the most likely cause since ceftriaxone and less commonly vancomycin can cause neutropenia. If it was checked, CBC monitoring at SNF is not available at this time to know if this decrease was a trend over the treatment course. He is on zosyn now and we can hold empiric vancomycin at this time, alternative MRSA coverage can be started if needed.   HPI: Jacob [name] is a 75 y.o. male with a medical history of diabetes, HTN, OSA, CAD s/p CABG, RCC s/p ablation in May, and herniated lumbar spine disc s/p surgery in May and June who presents from SNF with acute encephalopathy and fever that started yesterday. He was hospitalized in June for his worsening back pain and started on 4 weeks of IV antibiotics for suspect discitis seen  on MRI. No positive cultures were obtained during that workup and he was not febrile on measurement. After repeat surgery he was discharged with a PICC on vancomycin and ceftriaxone empirically that he stopped taking 2 days before this episode. On arrival at the ED he was found to be in Afib with RVR with fever of 102.9. He was started on antiarrhythmics, antibiotics were restarted, and repeat imaging of the chest and MRI of the back obtained.   Review of Systems: Review of Systems  Constitutional: Positive for fever.  Eyes: Negative for photophobia.  Respiratory:  Negative for cough.   Cardiovascular: Positive for leg swelling. Negative for chest pain.  Gastrointestinal: Positive for abdominal pain. Negative for diarrhea and blood in stool.  Genitourinary: Negative for hematuria.  Musculoskeletal: Positive for back pain.  Skin: Negative for itching and rash.  Neurological: Negative for tremors and headaches.  Psychiatric/Behavioral: The patient has insomnia.     Past Medical History  Diagnosis Date  . Allergy   . Asthma   . Diabetes mellitus without complication ([place])   . Cataract   . Arthritis   . Polymyalgia rheumatica (HCC)     maintained on Prednisone, Plaquenil. Followed by rhuematology every 4 months/Jacob [name].  Marland Kitchen COPD (chronic obstructive pulmonary disease) ([place])   . Anemia   . Hypertension   . BPH (benign prostatic hyperplasia)   . Sleep apnea     CPAP   Trying to use  . Shortness of breath dyspnea     with exertion  . Anxiety   . Chronic kidney disease     chronic  kidney failure  kidney function at 42%  . Coronary artery disease     CABG  7 bypasses  . Hepatitis     many years ago  . Cancer of kidney [facility])     Social History  Substance Use Topics  . Smoking status: Former Smoker    Quit date: 03/16/1957  . Smokeless tobacco: Never Used  . Alcohol Use: No    Family History  Problem Relation Age of Onset  . Hypertension Other    Allergies  Allergen Reactions  . Ace Inhibitors Other (See Comments)    Probably nausea and vomiting per patient   . Actonel [Risedronate] Nausea And Vomiting  . Ciprocinonide [Fluocinolone] Other (See Comments)    Probably nausea and vomiting per patient  . Flunisolide Other (See Comments)    Probably nausea and vomiting per patient   . Metformin And Related Other (See Comments)    Probably nausea and vomiting per patient   . Sertraline Other (See Comments)    Probably nausea and vomiting per patient   . Sulindac Other (See Comments)    Probably nausea and vomiting per patient     . Terazosin Other (See Comments)    Probably nausea and vomiting per patient     OBJECTIVE: Blood pressure 149/75, pulse 126, temperature 97.6 F (36.4 C), temperature source Oral, resp. rate 18, height 5\' 11"  (1.803 m), weight 88.6 kg (195 lb 5.2 oz), SpO2 100 %.  Physical Exam GENERAL- Somnolent, arousable to voice and following commands HEENT- Moist oral mucosa, no cervical LN enlargement. CARDIAC- Tachycardic, irregular rhythm, no murmur RESP- CTAB, no wheezes or crackles. ABDOMEN- RUQ TTP, may be mildly distended not tympanic BACK- No erythema or induration, No paraspinal tenderness, no CVA tenderness. NEURO- Moving all extremities to commands EXTREMITIES- Trace pedal edema b/l SKIN- Warm, dry, No rash or lesion PSYCH- Sleepy, oriented   Lab  Results Lab Results  Component Value Date   WBC 1.8* 10/02/2015   HGB 9.1* 10/02/2015   HCT 28.1* 10/02/2015   MCV 91.8 10/02/2015   PLT 210 10/02/2015    Lab Results  Component Value Date   CREATININE 2.36* 10/03/2015   BUN 38* 10/03/2015   NA 144 10/03/2015   K 3.3* 10/03/2015   CL 113* 10/03/2015   CO2 23 10/03/2015    Lab Results  Component Value Date   ALT 13* 10/02/2015   AST 12* 10/02/2015   ALKPHOS 99 10/02/2015   BILITOT 1.4* 10/02/2015     Microbiology: Recent Results (from the past 240 hour(s))  Culture, blood (Routine X 2) w Reflex to ID Panel     Status: None (Preliminary result)   Collection Time: 10/01/15 11:40 PM  Result Value Ref Range Status   Specimen Description BLOOD LEFT ARM  Final   Special Requests   Final    BOTTLES DRAWN AEROBIC AND ANAEROBIC 10CC PT ON VANC ZOSYN   Culture PENDING  Incomplete   Report Status PENDING  Incomplete  MRSA PCR Screening     Status: Abnormal   Collection Time: 10/02/15  9:40 AM  Result Value Ref Range Status   MRSA by PCR POSITIVE (A) NEGATIVE Final    Comment:        The GeneXpert MRSA Assay (FDA approved for NASAL specimens only), is one component of  a comprehensive MRSA colonization surveillance program. It is not intended to diagnose MRSA infection nor to guide or monitor treatment for MRSA infections. RESULT CALLED TO, READ BACK BY AND VERIFIED WITH: Bernarda Caffey RN 11:45 10/02/15 ([email or web address])     Collier [place]ina, MD PGY-II Internal Medicine Resident Pager# [phone] 10/03/2015, 10:27 AM

## 2015-10-03 NOTE — Progress Notes (Signed)
SLP Cancellation Note  Patient Details Name: Jacob [name] MRN: [name] DOB: Dec 10, 1940   Cancelled treatment:       Reason Eval/Treat Not Completed: Patient leaving for procedure or test/unavailable.  Returned for f/u assessment of swallow.  Pt remains lethargic - preparing to go to IR for aspiration of paraspinal abscess.  Will return next date.  HOLD POs, except meds whole in puree, while lethargic.  D/W RN.   [name] 10/03/2015, 3:27 PM

## 2015-10-03 NOTE — Progress Notes (Signed)
PROGRESS NOTE    Jacob [name]  [id number] DOB: 1940-04-28 DOA: 10/01/2015 PCP: No primary care provider on file.    Brief Narrative: Jacob [name] is a 75 y.o. male with medical history significant of CAD, CABG, IDDM, COPD, dyslipidemia, RCC, PMR, HTN, CKD, OSA , lumbar stenosis s/p correction, and recently was hospitalized with discitis,  was on rocephin and vancomycin till 7/13, presents for altered mental status. On arrival to ED, he was found to be febrile, tachycardic, hypotensive, in a fib with RVR, . He was admitted to step down for further management.  Cardiology and PCCM consulted.  PCCM signed off recommending a repeat MRI of the lumbar spine.  Assessment & Plan:   Principal Problem:   Sepsis ([place]) Active Problems:   Diabetes mellitus, type 2 ([place])   CHF with left ventricular diastolic dysfunction, NYHA class 2 (HCC)   Acute encephalopathy   Elevated troponin   Acute kidney injury superimposed on chronic kidney disease ([facility])   Atrial fibrillation with RVR ([facility])   Hypotension  Sepsis of unclear source possibly from health care associated pneumonia vs recurrent discitis.  CXR shows left lobe air space consolidation.  MRI of the lumbar spine ordered to further evaluate the discitis.  It showed 1.4 x 2.5 x 3.3 cm rim enhancing paraspinal fluid collection concerning for abscess. 7 x 15 mm fluid collection in RIGHT laminectomy defect equivocal for abscess, and could represent seroma or possibly hematoma. He just completed 4 weeks of vancomycin and rocephin on 7/13.  Currently he is on IV vancomycin and IV zosyn, IV azithromycin.  Lactic acid is normalized, trend pro calcitonin.  ID consult requested and recommended stopping the vancomycin and azithromycin. Blood cultures and urine cultures done and pending.  Requested neuro surgery consult in view of the abnormal MRI.  IR consulted to see if we can get aspiration of the abscess.    Atrial fibrillation with RVR: Rate is  better controlled after starting amiodarone gtt.  Serial troponins are elevated and peaked at 0.9  and Dr Wynonia Lawman on board for further recommendations.  Dig added by cardiology.    Acute kidney injury; Unclear etiology, sepsis, dehydration.  UA shows SMALL LEUKOCYTOSIS with few bacteria and wbc.  Urine cultures done and pending.  US renal ordered and currently on IV fluids. US renal does not show any hydronephrosis.  Not much improvement with fluids. Pt is incontinent and urine output couldn't be measure appropriately.  Monitor renal parameters.    IDDM: Currently npo, will get SLP eval before we start him on carb modified diet.  Resume his lantus and SSI.  CBG (last 3)   Recent Labs  10/03/15 0510 10/03/15 0804 10/03/15 1206  GLUCAP 287* 230* 231*   Increase the lantus to 14 units and change to mod SSI.   Nutrition: Start soft diet and get SLP evaluation.   Hypernatremia: Probably from dehydration and monitor renal parameters. Resolved with IV fluids.    Leukopenia,  Unclear etiology. Probably from IV antibiotics.  Improving.  Monitor.   anemia: Baseline hemoglobin around 10, currently its 9.  Monitor.   COPD: No wheezing heard, resume Symbicort and duo nebs as needed.   PMR:  On  Maintenance dose of prednisone, continue the same.   Hypertension: controlled.   Hypokalemia: replete as needed. Repeat in am.    DVT prophylaxis: sq heparin. Code Status: do not intubate only. Family Communication: wife and daughter at bedside Disposition Plan: pending further investigation.  Consultants:   Cardiology Dr Wynonia Lawman.  PCCM  ID Dr Linus Salmons  IR  Neuro surgery. Dr Parks Neptune.  Procedures: MRI LUMBAR SPINE.    Antimicrobials:  Vancomycin  Zosyn 7/19 Azithromycin 7/19  Subjective: Lethargic, no new complaints. Family visibly upset about his condition.  Objective: Filed Vitals:   10/03/15 0806 10/03/15 1055 10/03/15 1208 10/03/15 1355  BP: 149/75  156/66 169/90 141/76  Pulse: 126   97  Temp: 97.6 F (36.4 C)  98.1 F (36.7 C)   TempSrc: Oral  Axillary   Resp: 18     Height:      Weight:      SpO2: 100%       Intake/Output Summary (Last 24 hours) at 10/03/15 1507 Last data filed at 10/03/15 0800  Gross per 24 hour  Intake  436.8 ml  Output      0 ml  Net  436.8 ml   Filed Weights   10/02/15 0950 10/03/15 0522  Weight: 87.7 kg (193 lb 5.5 oz) 88.6 kg (195 lb 5.2 oz)    Examination:  General exam: lethargic, mumbling but appears comfortable.  Respiratory system: diminished at bases, no wheezing or rhonchi.  Cardiovascular system: irregular, s1s2, slightly tachycardic.  Gastrointestinal system: Abdomen is nondistended, soft and nontender. No organomegaly or masses felt. Normal bowel sounds heard. Central nervous system: lethargic. slightly confused. Able to recognize family.  Skin: No rashes, lesions or ulcers     Data Reviewed: I have personally reviewed following labs and imaging studies  CBC:  Recent Labs Lab 10/01/15 2145 10/02/15 0600  WBC 1.6* 1.8*  NEUTROABS 0.1*  --   HGB 10.1* 9.1*  HCT 31.0* 28.1*  MCV 90.6 91.8  PLT 218 [id number]   Basic Metabolic Panel:  Recent Labs Lab 10/01/15 2145 10/02/15 0600 10/03/15 0725  NA 149* 147* 144  K 2.9* 3.6 3.3*  CL 114* 114* 113*  CO2 26 22 23   GLUCOSE 159* 255* 269*  BUN 32* 33* 38*  CREATININE 2.33* 2.28* 2.36*  CALCIUM 9.2 8.2* 8.4*  MG 1.7 1.6* 1.8   GFR: Estimated Creatinine Clearance: 28.8 mL/min (by C-G formula based on Cr of 2.36). Liver Function Tests:  Recent Labs Lab 10/01/15 2145 10/02/15 0600  AST 14* 12*  ALT 14* 13*  ALKPHOS 117 99  BILITOT 1.7* 1.4*  PROT 5.3* 4.5*  ALBUMIN 2.5* 2.1*   No results for input(s): LIPASE, AMYLASE in the last 168 hours. No results for input(s): AMMONIA in the last 168 hours. Coagulation Profile: No results for input(s): INR, PROTIME in the last 168 hours. Cardiac Enzymes:  Recent Labs Lab  10/02/15 0311 10/02/15 0948 10/02/15 1424 10/03/15 1022  TROPONINI 0.90* 1.02* 0.86* 0.64*   BNP (last 3 results) No results for input(s): PROBNP in the last 8760 hours. HbA1C: No results for input(s): HGBA1C in the last 72 hours. CBG:  Recent Labs Lab 10/02/15 2042 10/03/15 0106 10/03/15 0510 10/03/15 0804 10/03/15 1206  GLUCAP 248* 280* 287* 230* 231*   Lipid Profile: No results for input(s): CHOL, HDL, LDLCALC, TRIG, CHOLHDL, LDLDIRECT in the last 72 hours. Thyroid Function Tests:  Recent Labs  10/02/15 0600  TSH 1.219   Anemia Panel: No results for input(s): VITAMINB12, FOLATE, FERRITIN, TIBC, IRON, RETICCTPCT in the last 72 hours. Sepsis Labs:  Recent Labs Lab 10/02/15 0009 10/02/15 0317  LATICACIDVEN 1.53 1.09    Recent Results (from the past 240 hour(s))  Culture, blood (Routine X 2) w Reflex to ID Panel  Status: None (Preliminary result)   Collection Time: 10/01/15 11:40 PM  Result Value Ref Range Status   Specimen Description BLOOD LEFT ARM  Final   Special Requests   Final    BOTTLES DRAWN AEROBIC AND ANAEROBIC 10CC PT ON VANC ZOSYN   Culture PENDING  Incomplete   Report Status PENDING  Incomplete  Urine culture     Status: Abnormal   Collection Time: 10/01/15 11:57 PM  Result Value Ref Range Status   Specimen Description URINE, RANDOM  Final   Special Requests NONE  Final   Culture >=100,000 COLONIES/mL YEAST (A)  Final   Report Status 10/03/2015 FINAL  Final  MRSA PCR Screening     Status: Abnormal   Collection Time: 10/02/15  9:40 AM  Result Value Ref Range Status   MRSA by PCR POSITIVE (A) NEGATIVE Final    Comment:        The GeneXpert MRSA Assay (FDA approved for NASAL specimens only), is one component of a comprehensive MRSA colonization surveillance program. It is not intended to diagnose MRSA infection nor to guide or monitor treatment for MRSA infections. RESULT CALLED TO, READ BACK BY AND VERIFIED WITH: Bernarda Caffey RN  11:45 10/02/15 ([email or web address])          Radiology Studies: Mr Lumbar Spine W Wo Contrast  10/03/2015  CLINICAL DATA:  Status post lumbar spine surgery September 06, 2015, recent diagnosis of discitis, sepsis. History of diabetes, hypertension. EXAM: MRI LUMBAR SPINE WITHOUT AND WITH CONTRAST TECHNIQUE: Multiplanar and multiecho pulse sequences of the lumbar spine were obtained without and with intravenous contrast. CONTRAST:  62mL MULTIHANCE GADOBENATE DIMEGLUMINE 529 MG/ML IV SOLN COMPARISON:  MRI of the lumbar spine August 29, 2015 and intraoperative radiograph of the lumbar spine September 06, 2015 FINDINGS: SEGMENTATION: For the purposes of this report, the last well-formed intervertebral disc will be described as L5-S1. ALIGNMENT: No malalignment.  Maintenance of the lumbar lordosis. VERTEBRAE:Patient is status post redo RIGHT L3 hemilaminectomy. L4 superior endplate Schmorl's node with acute on chronic discogenic endplate changes and enhancement. Progressed L3-4 disc height loss with minimal residual intradiscal residuals bright STIR signal. Moderate to severe L4-5 and L5-S1 disc height loss and subacute on chronic discogenic endplate changes. CONUS MEDULLARIS: Conus medullaris terminates at L1-2 and demonstrates normal morphology and signal characteristics. Cauda equina is normal. No abnormal cord, leptomeningeal enhancement. PARASPINAL AND SOFT TISSUES: 1.4 x 2.5 x 3.3 cm (transverse by AP by CC) rim enhancing fluid collection within the paraspinal central soft tissues, within 2.1 cm the skin surface at the surgical approach. 7 x 15 mm fluid collection within the RIGHT laminectomy surgical bed extending to the dorsal epidural space. DISC LEVELS: L1-2 and L2-3: No disc bulge, canal stenosis nor neural foraminal narrowing. L3-4: Enhancing RIGHT ventral epidural soft tissue with 14 mm of superior migration most compatible with hematoma and/or granulation tissue with 3 mm RIGHT central residual versus recurrent disc  extrusion. Status post RIGHT laminectomy. Mild canal stenosis with effaced upper RIGHT lateral recess which may affect the traversing RIGHT L3 nerve. Moderate facet arthropathy. Moderate to severe bilateral neural foraminal narrowing. Encroachment upon the exited L3 nerves is similar. L4-5: Small broad-based disc bulge. Moderate facet arthropathy and ligamentum flavum redundancy. Mild canal stenosis and encroachment upon the exited L4 nerves. Mild to moderate RIGHT, mild LEFT neural foraminal narrowing. L5-S1: 5 mm broad-based central disc protrusion and annular bulging may affect the exited RIGHT greater LEFT L5 nerve. Mild facet arthropathy and ligamentum flavum redundancy  without canal stenosis. Moderate to severe RIGHT, mild LEFT neural foraminal narrowing. IMPRESSION: Status post redo interval RIGHT L3 hemilaminectomy for resection of disc extrusion with minimal residual suspected component, ventral epidural hematoma and/or granulation tissue. 1.4 x 2.5 x 3.3 cm rim enhancing paraspinal fluid collection concerning for abscess. 7 x 15 mm fluid collection in RIGHT laminectomy defect equivocal for abscess, and could represent seroma or possibly hematoma. No convincing evidence of discitis osteomyelitis. Similar degenerative lumbar spine resulting in mild canal stenosis L3-4 and L4-5. Neural foraminal narrowing L3-4 through L5-S1: Moderate to severe at L3-4 and L5-S1. Electronically Signed   By: Elon Alas M.D.   On: 10/03/2015 00:49   US Renal  10/03/2015  CLINICAL DATA:  Acute kidney injury EXAM: RENAL / URINARY TRACT ULTRASOUND COMPLETE COMPARISON:  02/25/2015 outside abdominal CT [facility]) FINDINGS: Right Kidney: Length: 12 cm. No hydronephrosis. There was a right lower pole enhancing mass on 02/25/2015 CT. Interval percutaneous ablation. No findings in this region. Left Kidney: Length: 12 cm. Echogenicity within normal limits. No mass or hydronephrosis visualized. Bladder: Apparent  dependent echoes in the bladder appear artifactual. Moderate distension of the bladder without wall thickening. IMPRESSION: 1. No hydronephrosis. 2. No findings in region of treated right renal cell carcinoma. Electronically Signed   By: Monte Fantasia M.D.   On: 10/03/2015 03:46   Dg Chest Port 1 View  10/01/2015  CLINICAL DATA:  Fever EXAM: PORTABLE CHEST 1 VIEW COMPARISON:  Aug 05, 2015 FINDINGS: There is airspace consolidation throughout the left lower lobe with small left effusion. There is a small calcified granuloma in the right base. There is slight atelectasis in the right base. There is cardiomegaly pulmonary vascularity within normal limits. There is atherosclerotic calcification in the aorta. Patient is status post coronary artery bypass grafting. No pneumothorax. No adenopathy. No apparent bone lesions. IMPRESSION: Left lower lobe airspace consolidation consistent with pneumonia. Small left effusion. Mild right base atelectasis. Calcified granuloma right base. Stable cardiac prominence. There is aortic atherosclerosis. Followup PA and lateral chest radiographs recommended in 3-4 weeks following trial of antibiotic therapy to ensure resolution and exclude underlying malignancy. Electronically Signed   By: Lowella Grip III M.D.   On: 10/01/2015 21:53        Scheduled Meds: . antiseptic oral rinse  7 mL Mouth Rinse q12n4p  . chlorhexidine  15 mL Mouth Rinse BID  . Chlorhexidine Gluconate Cloth  6 each Topical Q0600  . cycloSPORINE  1 drop Both Eyes BID  . finasteride  5 mg Oral QHS  . heparin  5,000 Units Subcutaneous Q8H  . insulin aspart  0-9 Units Subcutaneous Q4H  . insulin glargine  10 Units Subcutaneous QHS  . metoprolol tartrate  25 mg Oral TID  . mometasone-formoterol  2 puff Inhalation BID  . mupirocin ointment  1 application Nasal BID  . pilocarpine  1 drop Both Eyes TID  . piperacillin-tazobactam (ZOSYN)  IV  3.375 g Intravenous Q8H  . potassium chloride  40 mEq  Oral Daily  . predniSONE  7 mg Oral Q breakfast  . sodium chloride flush  3 mL Intravenous Q12H  . tamsulosin  0.4 mg Oral QHS   Continuous Infusions: . sodium chloride 75 mL/hr at 10/02/15 0658  . amiodarone 30 mg/hr (10/03/15 1357)     LOS: 1 day    Time spent: 30 minutes.     Hosie Poisson, MD [facility] Pager [phone]   If 7PM-7AM, please contact night-coverage [email or web address] Password [email or web address]  10/03/2015, 3:07 PM

## 2015-10-04 ENCOUNTER — Inpatient Hospital Stay (HOSPITAL_COMMUNITY): Payer: PPO

## 2015-10-04 LAB — GLUCOSE, CAPILLARY
Glucose-Capillary: 159 mg/dL — ABNORMAL HIGH (ref 65–99)
Glucose-Capillary: 160 mg/dL — ABNORMAL HIGH (ref 65–99)
Glucose-Capillary: 172 mg/dL — ABNORMAL HIGH (ref 65–99)
Glucose-Capillary: 122 mg/dL — ABNORMAL HIGH (ref 65–99)
Glucose-Capillary: 131 mg/dL — ABNORMAL HIGH (ref 65–99)

## 2015-10-04 LAB — COMPREHENSIVE METABOLIC PANEL
ALT: 14 U/L — ABNORMAL LOW (ref 17–63)
AST: 13 U/L — ABNORMAL LOW (ref 15–41)
Albumin: 2.3 g/dL — ABNORMAL LOW (ref 3.5–5.0)
Alkaline Phosphatase: 98 U/L (ref 38–126)
Anion gap: 8 (ref 5–15)
BUN: 32 mg/dL — ABNORMAL HIGH (ref 6–20)
CO2: 21 mmol/L — ABNORMAL LOW (ref 22–32)
Calcium: 8.6 mg/dL — ABNORMAL LOW (ref 8.9–10.3)
Chloride: 118 mmol/L — ABNORMAL HIGH (ref 101–111)
Creatinine, Ser: 2.26 mg/dL — ABNORMAL HIGH (ref 0.61–1.24)
GFR calc Af Amer: 31 mL/min — ABNORMAL LOW (ref >60)
GFR calc non Af Amer: 27 mL/min — ABNORMAL LOW (ref >60)
Glucose, Bld: 174 mg/dL — ABNORMAL HIGH (ref 65–99)
Potassium: 4.1 mmol/L (ref 3.5–5.1)
Sodium: 147 mmol/L — ABNORMAL HIGH (ref 135–145)
Total Bilirubin: 1.1 mg/dL (ref 0.3–1.2)
Total Protein: 5.1 g/dL — ABNORMAL LOW (ref 6.5–8.1)

## 2015-10-04 LAB — CBC
HCT: 31.9 % — ABNORMAL LOW (ref 39.0–52.0)
Hemoglobin: 10.1 g/dL — ABNORMAL LOW (ref 13.0–17.0)
MCH: 29 pg (ref 26.0–34.0)
MCHC: 31.7 g/dL (ref 30.0–36.0)
MCV: 91.7 fL (ref 78.0–100.0)
Platelets: 257 10*3/uL (ref 150–400)
RBC: 3.48 MIL/uL — ABNORMAL LOW (ref 4.22–5.81)
RDW: 15.9 % — ABNORMAL HIGH (ref 11.5–15.5)
WBC: 1.9 10*3/uL — ABNORMAL LOW (ref 4.0–10.5)

## 2015-10-04 LAB — TROPONIN I: Troponin I: 0.51 ng/mL (ref <0.03)

## 2015-10-04 MED ORDER — AMIODARONE HCL 200 MG PO TABS
200.0000 mg | ORAL_TABLET | Freq: Two times a day (BID) | ORAL | Status: DC
  Administered 2015-10-04 – 2015-10-08 (×8): 200 mg via ORAL
  Filled 2015-10-04 (×8): qty 1

## 2015-10-04 MED ORDER — FLUCONAZOLE IN SODIUM CHLORIDE 200-0.9 MG/100ML-% IV SOLN
200.0000 mg | Freq: Once | INTRAVENOUS | Status: AC
Start: 2015-10-04 — End: 2015-10-04
  Administered 2015-10-04: 200 mg via INTRAVENOUS
  Filled 2015-10-04: qty 100

## 2015-10-04 NOTE — Progress Notes (Signed)
Speech Language Pathology Treatment: Dysphagia  Patient Details Name: Jacob [name] MRN: [name] DOB: 12-14-1940 Today's Date: 10/04/2015 Time: 1130-1140 SLP Time Calculation (min) (ACUTE ONLY): 10 min  Assessment / Plan / Recommendation Clinical Impression  Pt remains with delirium; arousable in order to consume water from straw with no overt s/s of aspiration, but refused all other POs.  Speech poorly intelligible; not oriented.  Recommend giving meds whole in puree; continue providing diet tray, but hold if pt is not sufficiently alert.  D/W RN.  Pt afebrile; lung sounds are clear.    HPI HPI: 75 y.o. male with medical history significant of CAD, CABG, IDDM, COPD, dyslipidemia, RCC, PMR, HTN, CKD, OSA , lumbar stenosis s/p correction, and recently was hospitalized with discitis , was on rocephin and vancomycin till 7/13, presents for altered mental status.  Dx sepsis, acute kidney injury.        SLP Plan  Continue with current plan of care     Recommendations  Diet recommendations: Regular;Thin liquid Liquids provided via: Cup;Straw Medication Administration: Whole meds with puree Supervision: Staff to assist with self feeding Compensations: Minimize environmental distractions Postural Changes and/or Swallow Maneuvers: Seated upright 90 degrees             Oral Care Recommendations: Oral care BID Follow up Recommendations: None Plan:Continue SLP     GO               Felica Chargois L. Tivis Ringer, [place] CCC/SLP Pager [phone]  Jacob [name] 10/04/2015, 12:56 PM

## 2015-10-04 NOTE — Progress Notes (Signed)
Family at bedside and would like to talk to MD. Dr. Karleen Hampshire was paged. Awaiting for her to call me back.

## 2015-10-04 NOTE — Progress Notes (Signed)
PROGRESS NOTE    FABIO HORATH  [id number] DOB: Oct 31, 1940 DOA: 10/01/2015 PCP: No primary care provider on file.    Brief Narrative: Jacob [name] is a 75 y.o. male with medical history significant of CAD, CABG, IDDM, COPD, dyslipidemia, RCC, PMR, HTN, CKD, OSA , lumbar stenosis s/p correction, and recently was hospitalized with discitis,  was on rocephin and vancomycin till 7/13, presents for altered mental status. On arrival to ED, he was found to be febrile, tachycardic, hypotensive, in a fib with RVR, . He was admitted to step down for further management.  Cardiology and PCCM consulted.  PCCM signed off recommending a repeat MRI of the lumbar spine. MRI spine showed fluid collection , suspect abscess, NS and IR consulted for further eval.  CT guided aspiration of the fluid collection done on 7/20 and cultures are pending.   Assessment & Plan:   Principal Problem:   Sepsis ([place]) Active Problems:   Diabetes mellitus, type 2 ([place])   CHF with left ventricular diastolic dysfunction, NYHA class 2 (HCC)   Acute encephalopathy   Elevated troponin   Acute kidney injury superimposed on chronic kidney disease (HCC)   Atrial fibrillation with RVR (HCC)   Hypotension  Sepsis of unclear source possibly from health care associated pneumonia vs recurrent discitis.  CXR shows left lobe air space consolidation.  MRI of the lumbar spine ordered to further evaluate the discitis.  It showed 1.4 x 2.5 x 3.3 cm rim enhancing paraspinal fluid collection concerning for abscess. 7 x 15 mm fluid collection in RIGHT laminectomy defect equivocal for abscess, and could represent seroma or possibly hematoma. IR consulted and he underwent CT guided aspiration of the fluid collection  He just completed 4 weeks of vancomycin and rocephin on 7/13.  Currently he is on IV vancomycin and IV zosyn, IV azithromycin. , later transitioned to zosyn alone, ID has discontinued the vancomycin and azithromycin.  Lactic  acid is normalized, trend pro calcitonin.  Blood cultures  and pending, negative so far.  Requested neuro surgery consult in view of the abnormal MRI, suggested probably not an abscess as the cultures have been negative.     Atrial fibrillation with RVR: Rate is better controlled after starting amiodarone gtt.  Serial troponins are elevated and peaked at 0.9  and Dr Wynonia Lawman on board for further recommendations.  Dig added by cardiology.  Amiodarone gtt stopped and changed to po.  Bb added by cardiology. Will nee dto be started on anti coagulation .    Acute kidney injury; Unclear etiology, sepsis, dehydration.  UA shows SMALL LEUKOCYTOSIS with few bacteria and wbc.  Urine cultures done , show yeast, one dose of diflucan ordered.  US renal ordered and currently on IV fluids. US renal does not show any hydronephrosis.  Not much improvement with fluids. Pt is incontinent and urine output couldn't be measure appropriately.  Monitor renal parameters.    IDDM: Currently npo, SLP  EVAL recommended restarting diet when he is more awake.  Resume his lantus and SSI.  CBG (last 3)   Recent Labs  10/03/15 2339 10/04/15 0341 10/04/15 0855  GLUCAP 152* 159* 160*   Increase the lantus to 14 units and change to mod SSI.  No change in management.   Nutrition: Start soft diet and get SLP evaluation.   Hypernatremia: Probably from dehydration and monitor renal parameters.  Poor po intake.    Leukopenia,  Unclear etiology. Probably from IV antibiotics.  Improving.  Monitor.  anemia: Baseline hemoglobin around 10,  Stable. Monitor.   COPD: No wheezing heard, resume Symbicort and duo nebs as needed.   PMR:  On  Maintenance dose of prednisone, continue the same.   Hypertension: controlled. Resume BB.   Hypokalemia: replete as needed. Repeat in am shows improvement.    DVT prophylaxis: sq heparin. Code Status: do not intubate only. Family Communication: wife and daughter  at bedside Disposition Plan: pending further investigation.    Consultants:   Cardiology Dr Wynonia Lawman.  PCCM  ID Dr Linus Salmons  IR  Neuro surgery. Dr Parks Neptune.  Procedures: MRI LUMBAR SPINE.    Antimicrobials:  Vancomycin  Zosyn 7/19 Azithromycin 7/19  Subjective: Confused , mumbling to take the mittens out.   Objective: Filed Vitals:   10/04/15 0500 10/04/15 0744 10/04/15 0855 10/04/15 0902  BP:   155/73 155/73  Pulse:   94 77  Temp:    98.3 F (36.8 C)  TempSrc:    Oral  Resp:    16  Height:      Weight: 90.9 kg (200 lb 6.4 oz)     SpO2:  98%  97%    Intake/Output Summary (Last 24 hours) at 10/04/15 1035 Last data filed at 10/04/15 0933  Gross per 24 hour  Intake  883.6 ml  Output   1100 ml  Net -216.4 ml   Filed Weights   10/02/15 0950 10/03/15 0522 10/04/15 0500  Weight: 87.7 kg (193 lb 5.5 oz) 88.6 kg (195 lb 5.2 oz) 90.9 kg (200 lb 6.4 oz)    Examination:  General exam: lethargic, mumbling, confused, has mittens Respiratory system: diminished at bases, no wheezing or rhonchi.  Cardiovascular system: irregular, s1s2, rate controlled.  Gastrointestinal system: Abdomen is nondistended, soft and nontender. No organomegaly or masses felt. Normal bowel sounds heard. Central nervous system: lethargic. slightly confused. Able to recognize family. Talking with family.  Skin: No rashes, lesions or ulcers     Data Reviewed: I have personally reviewed following labs and imaging studies  CBC:  Recent Labs Lab 10/01/15 2145 10/02/15 0600 10/04/15 0101  WBC 1.6* 1.8* 1.9*  NEUTROABS 0.1*  --   --   HGB 10.1* 9.1* 10.1*  HCT 31.0* 28.1* 31.9*  MCV 90.6 91.8 91.7  PLT 218 210 [id number]   Basic Metabolic Panel:  Recent Labs Lab 10/01/15 2145 10/02/15 0600 10/03/15 0725 10/04/15 0101  NA 149* 147* 144 147*  K 2.9* 3.6 3.3* 4.1  CL 114* 114* 113* 118*  CO2 26 22 23  21*  GLUCOSE 159* 255* 269* 174*  BUN 32* 33* 38* 32*  CREATININE 2.33* 2.28* 2.36*  2.26*  CALCIUM 9.2 8.2* 8.4* 8.6*  MG 1.7 1.6* 1.8  --    GFR: Estimated Creatinine Clearance: 32.6 mL/min (by C-G formula based on Cr of 2.26). Liver Function Tests:  Recent Labs Lab 10/01/15 2145 10/02/15 0600 10/04/15 0101  AST 14* 12* 13*  ALT 14* 13* 14*  ALKPHOS 117 99 98  BILITOT 1.7* 1.4* 1.1  PROT 5.3* 4.5* 5.1*  ALBUMIN 2.5* 2.1* 2.3*   No results for input(s): LIPASE, AMYLASE in the last 168 hours. No results for input(s): AMMONIA in the last 168 hours. Coagulation Profile:  Recent Labs Lab 10/03/15 1925  INR 1.43   Cardiac Enzymes:  Recent Labs Lab 10/02/15 0948 10/02/15 1424 10/03/15 1022 10/03/15 1925 10/04/15 0101  TROPONINI 1.02* 0.86* 0.64* 0.57* 0.51*   BNP (last 3 results) No results for input(s): PROBNP in the last 8760 hours.  HbA1C: No results for input(s): HGBA1C in the last 72 hours. CBG:  Recent Labs Lab 10/03/15 1732 10/03/15 1946 10/03/15 2339 10/04/15 0341 10/04/15 0855  GLUCAP 270* 204* 152* 159* 160*   Lipid Profile: No results for input(s): CHOL, HDL, LDLCALC, TRIG, CHOLHDL, LDLDIRECT in the last 72 hours. Thyroid Function Tests:  Recent Labs  10/02/15 0600  TSH 1.219   Anemia Panel: No results for input(s): VITAMINB12, FOLATE, FERRITIN, TIBC, IRON, RETICCTPCT in the last 72 hours. Sepsis Labs:  Recent Labs Lab 10/02/15 0009 10/02/15 0317  LATICACIDVEN 1.53 1.09    Recent Results (from the past 240 hour(s))  Culture, blood (Routine X 2) w Reflex to ID Panel     Status: None (Preliminary result)   Collection Time: 10/01/15 11:40 PM  Result Value Ref Range Status   Specimen Description BLOOD LEFT ARM  Final   Special Requests   Final    BOTTLES DRAWN AEROBIC AND ANAEROBIC 10CC PT ON VANC ZOSYN   Culture NO GROWTH 1 DAY  Final   Report Status PENDING  Incomplete  Culture, blood (Routine X 2) w Reflex to ID Panel     Status: None (Preliminary result)   Collection Time: 10/01/15 11:45 PM  Result Value Ref  Range Status   Specimen Description BLOOD LEFT HAND  Final   Special Requests IN PEDIATRIC BOTTLE 4CC  Final   Culture NO GROWTH 1 DAY  Final   Report Status PENDING  Incomplete  Urine culture     Status: Abnormal   Collection Time: 10/01/15 11:57 PM  Result Value Ref Range Status   Specimen Description URINE, RANDOM  Final   Special Requests NONE  Final   Culture >=100,000 COLONIES/mL YEAST (A)  Final   Report Status 10/03/2015 FINAL  Final  MRSA PCR Screening     Status: Abnormal   Collection Time: 10/02/15  9:40 AM  Result Value Ref Range Status   MRSA by PCR POSITIVE (A) NEGATIVE Final    Comment:        The GeneXpert MRSA Assay (FDA approved for NASAL specimens only), is one component of a comprehensive MRSA colonization surveillance program. It is not intended to diagnose MRSA infection nor to guide or monitor treatment for MRSA infections. RESULT CALLED TO, READ BACK BY AND VERIFIED WITH: Bernarda Caffey RN 11:45 10/02/15 ([email or web address])   Aerobic/Anaerobic Culture (surgical/deep wound)     Status: None (Preliminary result)   Collection Time: 10/03/15  5:00 PM  Result Value Ref Range Status   Specimen Description ABSCESS BACK  Final   Special Requests NONE  Final   Gram Stain   Final    FEW WBC PRESENT,BOTH PMN AND MONONUCLEAR NO ORGANISMS SEEN    Culture PENDING  Incomplete   Report Status PENDING  Incomplete         Radiology Studies: Mr Lumbar Spine W Wo Contrast  10/03/2015  CLINICAL DATA:  Status post lumbar spine surgery September 06, 2015, recent diagnosis of discitis, sepsis. History of diabetes, hypertension. EXAM: MRI LUMBAR SPINE WITHOUT AND WITH CONTRAST TECHNIQUE: Multiplanar and multiecho pulse sequences of the lumbar spine were obtained without and with intravenous contrast. CONTRAST:  75mL MULTIHANCE GADOBENATE DIMEGLUMINE 529 MG/ML IV SOLN COMPARISON:  MRI of the lumbar spine August 29, 2015 and intraoperative radiograph of the lumbar spine September 06, 2015  FINDINGS: SEGMENTATION: For the purposes of this report, the last well-formed intervertebral disc will be described as L5-S1. ALIGNMENT: No malalignment.  Maintenance of the lumbar lordosis.  VERTEBRAE:Patient is status post redo RIGHT L3 hemilaminectomy. L4 superior endplate Schmorl's node with acute on chronic discogenic endplate changes and enhancement. Progressed L3-4 disc height loss with minimal residual intradiscal residuals bright STIR signal. Moderate to severe L4-5 and L5-S1 disc height loss and subacute on chronic discogenic endplate changes. CONUS MEDULLARIS: Conus medullaris terminates at L1-2 and demonstrates normal morphology and signal characteristics. Cauda equina is normal. No abnormal cord, leptomeningeal enhancement. PARASPINAL AND SOFT TISSUES: 1.4 x 2.5 x 3.3 cm (transverse by AP by CC) rim enhancing fluid collection within the paraspinal central soft tissues, within 2.1 cm the skin surface at the surgical approach. 7 x 15 mm fluid collection within the RIGHT laminectomy surgical bed extending to the dorsal epidural space. DISC LEVELS: L1-2 and L2-3: No disc bulge, canal stenosis nor neural foraminal narrowing. L3-4: Enhancing RIGHT ventral epidural soft tissue with 14 mm of superior migration most compatible with hematoma and/or granulation tissue with 3 mm RIGHT central residual versus recurrent disc extrusion. Status post RIGHT laminectomy. Mild canal stenosis with effaced upper RIGHT lateral recess which may affect the traversing RIGHT L3 nerve. Moderate facet arthropathy. Moderate to severe bilateral neural foraminal narrowing. Encroachment upon the exited L3 nerves is similar. L4-5: Small broad-based disc bulge. Moderate facet arthropathy and ligamentum flavum redundancy. Mild canal stenosis and encroachment upon the exited L4 nerves. Mild to moderate RIGHT, mild LEFT neural foraminal narrowing. L5-S1: 5 mm broad-based central disc protrusion and annular bulging may affect the exited RIGHT  greater LEFT L5 nerve. Mild facet arthropathy and ligamentum flavum redundancy without canal stenosis. Moderate to severe RIGHT, mild LEFT neural foraminal narrowing. IMPRESSION: Status post redo interval RIGHT L3 hemilaminectomy for resection of disc extrusion with minimal residual suspected component, ventral epidural hematoma and/or granulation tissue. 1.4 x 2.5 x 3.3 cm rim enhancing paraspinal fluid collection concerning for abscess. 7 x 15 mm fluid collection in RIGHT laminectomy defect equivocal for abscess, and could represent seroma or possibly hematoma. No convincing evidence of discitis osteomyelitis. Similar degenerative lumbar spine resulting in mild canal stenosis L3-4 and L4-5. Neural foraminal narrowing L3-4 through L5-S1: Moderate to severe at L3-4 and L5-S1. Electronically Signed   By: Elon Alas M.D.   On: 10/03/2015 00:49   US Renal  10/03/2015  CLINICAL DATA:  Acute kidney injury EXAM: RENAL / URINARY TRACT ULTRASOUND COMPLETE COMPARISON:  02/25/2015 outside abdominal CT [facility]) FINDINGS: Right Kidney: Length: 12 cm. No hydronephrosis. There was a right lower pole enhancing mass on 02/25/2015 CT. Interval percutaneous ablation. No findings in this region. Left Kidney: Length: 12 cm. Echogenicity within normal limits. No mass or hydronephrosis visualized. Bladder: Apparent dependent echoes in the bladder appear artifactual. Moderate distension of the bladder without wall thickening. IMPRESSION: 1. No hydronephrosis. 2. No findings in region of treated right renal cell carcinoma. Electronically Signed   By: Monte Fantasia M.D.   On: 10/03/2015 03:46   Ct Aspiration  10/04/2015  INDICATION: History of redo right L3 hemilaminectomy, now with indeterminate fluid collection about the posterior aspect of the operative site. Please perform CT-guided aspiration for diagnostic purposes. EXAM: CT GUIDANCE NEEDLE PLACEMENT COMPARISON:  Fluoroscopic guided L3-L4 disc aspiration -  08/30/2015 MEDICATIONS: The patient is currently admitted to the hospital and receiving intravenous antibiotics. The antibiotics were administered within an appropriate time frame prior to the initiation of the procedure. ANESTHESIA/SEDATION: None CONTRAST:  None COMPLICATIONS: None immediate. PROCEDURE: Informed written consent was obtained from the patient's white after a discussion of the  risks, benefits and alternatives to treatment. The patient was placed left lateral decubitus on the CT gantry and a pre procedural CT was performed re-demonstrating the known abscess/fluid collection within the subcutaneous tissues posterior to the L3 operative site. The procedure was planned. A timeout was performed prior to the initiation of the procedure. The skin overlying the posterior aspect of the lower back was prepped and draped in the usual sterile fashion. The overlying soft tissues were anesthetized with 1% lidocaine with epinephrine. Appropriate trajectory was planned with the use of a 22 gauge spinal needle. An 18 gauge trocar needle was advanced into the fluid collection. Appropriate positioning was confirmed with CT imaging. Approximately 2 cc of blood tinged, slightly cloudy fluid was aspirated. All aspirated fluid was capped and sent to the laboratory for analysis. A dressing was placed. The patient tolerated the procedure well without immediate post procedural complication. IMPRESSION: Title successful CT guided aspiration of approximately 2 cc of blood tinged, slightly cloudy fluid from indeterminate fluid collection posterior to the L3 operative site. Samples were sent to the laboratory as requested by the ordering clinical team. Electronically Signed   By: Sandi Mariscal M.D.   On: 10/04/2015 08:29        Scheduled Meds: . antiseptic oral rinse  7 mL Mouth Rinse q12n4p  . chlorhexidine  15 mL Mouth Rinse BID  . Chlorhexidine Gluconate Cloth  6 each Topical Q0600  . cycloSPORINE  1 drop Both Eyes BID   . finasteride  5 mg Oral QHS  . fluconazole (DIFLUCAN) IV  200 mg Intravenous Once  . heparin  5,000 Units Subcutaneous Q8H  . insulin aspart  0-15 Units Subcutaneous TID WC  . insulin glargine  14 Units Subcutaneous QHS  . metoprolol tartrate  25 mg Oral TID  . mometasone-formoterol  2 puff Inhalation BID  . mupirocin ointment  1 application Nasal BID  . pilocarpine  1 drop Both Eyes TID  . piperacillin-tazobactam (ZOSYN)  IV  3.375 g Intravenous Q8H  . predniSONE  7 mg Oral Q breakfast  . sodium chloride flush  3 mL Intravenous Q12H  . tamsulosin  0.4 mg Oral QHS   Continuous Infusions: . sodium chloride 75 mL/hr at 10/04/15 0527  . amiodarone 30 mg/hr (10/04/15 0154)     LOS: 2 days    Time spent: 30 minutes.     Hosie Poisson, MD [facility] Pager [phone]   If 7PM-7AM, please contact night-coverage [email or web address] Password [facility] 10/04/2015, 10:35 AM

## 2015-10-04 NOTE — Progress Notes (Signed)
Subjective:  He does know he is in the hospital and does know who I am but is still delirious and mumbling at times.  No definite shortness of breath.  He does have a significant issue with possible discitis.  He is being investigated for whether this is an abscess or not.  Atrial fibrillation rate is better controlled today.  Not short of breath.  Objective:  Vital Signs in the last 24 hours: BP 150/75 mmHg  Pulse 83  Temp(Src) 98.5 F (36.9 C) (Oral)  Resp 25  Ht 5\' 11"  (1.803 m)  Wt 90.9 kg (200 lb 6.4 oz)  BMI 27.96 kg/m2  SpO2 98%  Physical Exam: Somewhat confused male mumbling speech, lying in bed but in no acute distress Lungs:  Clear Cardiac:  irregular rhythm, normal S1 and S2, no S3 Abdomen:  Soft, nontender, no masses Extremities: Trace edema present  Intake/Output from previous day: 07/20 0701 - 07/21 0700 In: 1158.7 [I.V.:1008.7; IV Piggyback:150] Out: 800 [Urine:800]  Weight Filed Weights   10/02/15 0950 10/03/15 0522 10/04/15 0500  Weight: 87.7 kg (193 lb 5.5 oz) 88.6 kg (195 lb 5.2 oz) 90.9 kg (200 lb 6.4 oz)    Lab Results: Basic Metabolic Panel:  Recent Labs  10/03/15 0725 10/04/15 0101  NA 144 147*  K 3.3* 4.1  CL 113* 118*  CO2 23 21*  GLUCOSE 269* 174*  BUN 38* 32*  CREATININE 2.36* 2.26*   CBC:  Recent Labs  10/01/15 2145 10/02/15 0600 10/04/15 0101  WBC 1.6* 1.8* 1.9*  NEUTROABS 0.1*  --   --   HGB 10.1* 9.1* 10.1*  HCT 31.0* 28.1* 31.9*  MCV 90.6 91.8 91.7  PLT 218 210 257   Cardiac Enzymes: Troponin (Point of Care Test)  Recent Labs  10/01/15 2138  TROPIPOC 2.03*   Cardiac Panel (last 3 results)  Recent Labs  10/03/15 1022 10/03/15 1925 10/04/15 0101  TROPONINI 0.64* 0.57* 0.51*    Telemetry: Atrial fibrillation rate is still somewhat rapid  Assessment/Plan:  1.  Paroxysmal atrial fibrillation rate Now controlled with the addition of metoprolol-if he is taking by mouth can change him to oral amiodarone.   Would consider anticoagulation if safe from neurosurgical viewpoint and in light of the other workup.   2.  Coronary artery disease with previous bypass grafting 3.  Elevated troponin suggestive of demand ischemia likely due to rapid atrial fibrillation and sepsis 4.  Probable sepsis with possible abscess around previous spinal operation site 5.  Delirium still present but somewhat improved.  Recommendations:  Rate is better controlled at the present time with his atrial fibrillation.  Continue beta blocker.  Consider either discontinuing the amiodarone or changing to oral depending on need for rate control at this time.  Consider anticoagulation.  Kerry Hough  MD [facility]  10/04/2015, 8:44 AM

## 2015-10-04 NOTE — Progress Notes (Signed)
Dr. Vernelle Emerald was making his rounds this morning and he stated to me that he does not think that patient is septic because the culture that was done by IR yesterday is negative and he only had one elevated temp while he was in ED.  I asked him if they fixed the problem prior for him coming to our unit.

## 2015-10-04 NOTE — Progress Notes (Signed)
Patient ID: Jacob [name], male   DOB: 05/02/1940, 75 y.o.   MRN: [id number] BP 173/70 mmHg  Pulse 117  Temp(Src) 97.6 F (36.4 C) (Oral)  Resp 16  Ht 5\' 11"  (1.803 m)  Wt 90.9 kg (200 lb 6.4 oz)  BMI 27.96 kg/m2  SpO2 97% Culture is sterile to date.  No evidence of abscess, no discitis.  Remains confused

## 2015-10-04 NOTE — Progress Notes (Signed)
[place] for Infectious Disease   Reason for visit: Follow up on fever  Interval History: has not had a fever since initial fever 7/18.  Aspiration of fluid collection without pus or organisms.  Urine culture only with yeast.  WBC up to 1.9.    Physical Exam: Constitutional:  Filed Vitals:   10/04/15 1350 10/04/15 1500  BP: 173/70   Pulse: 117   Temp:  97.9 F (36.6 C)  Resp:     patient appears in nad Respiratory: Normal respiratory effort; CTA B Cardiovascular: RRR GI: soft ,nt, nd Neuro:confused  Review of Systems: Unable to be assessed due to mental status  Lab Results  Component Value Date   WBC 1.9* 10/04/2015   HGB 10.1* 10/04/2015   HCT 31.9* 10/04/2015   MCV 91.7 10/04/2015   PLT 257 10/04/2015    Lab Results  Component Value Date   CREATININE 2.26* 10/04/2015   BUN 32* 10/04/2015   NA 147* 10/04/2015   K 4.1 10/04/2015   CL 118* 10/04/2015   CO2 21* 10/04/2015    Lab Results  Component Value Date   ALT 14* 10/04/2015   AST 13* 10/04/2015   ALKPHOS 98 10/04/2015     Microbiology: Recent Results (from the past 240 hour(s))  Culture, blood (Routine X 2) w Reflex to ID Panel     Status: None (Preliminary result)   Collection Time: 10/01/15 11:40 PM  Result Value Ref Range Status   Specimen Description BLOOD LEFT ARM  Final   Special Requests   Final    BOTTLES DRAWN AEROBIC AND ANAEROBIC 10CC PT ON VANC ZOSYN   Culture NO GROWTH 2 DAYS  Final   Report Status PENDING  Incomplete  Culture, blood (Routine X 2) w Reflex to ID Panel     Status: None (Preliminary result)   Collection Time: 10/01/15 11:45 PM  Result Value Ref Range Status   Specimen Description BLOOD LEFT HAND  Final   Special Requests IN PEDIATRIC BOTTLE 4CC  Final   Culture NO GROWTH 2 DAYS  Final   Report Status PENDING  Incomplete  Urine culture     Status: Abnormal   Collection Time: 10/01/15 11:57 PM  Result Value Ref Range Status   Specimen Description URINE,  RANDOM  Final   Special Requests NONE  Final   Culture >=100,000 COLONIES/mL YEAST (A)  Final   Report Status 10/03/2015 FINAL  Final  MRSA PCR Screening     Status: Abnormal   Collection Time: 10/02/15  9:40 AM  Result Value Ref Range Status   MRSA by PCR POSITIVE (A) NEGATIVE Final    Comment:        The GeneXpert MRSA Assay (FDA approved for NASAL specimens only), is one component of a comprehensive MRSA colonization surveillance program. It is not intended to diagnose MRSA infection nor to guide or monitor treatment for MRSA infections. RESULT CALLED TO, READ BACK BY AND VERIFIED WITH: Bernarda Caffey RN 11:45 10/02/15 ([email or web address])   Aerobic/Anaerobic Culture (surgical/deep wound)     Status: None (Preliminary result)   Collection Time: 10/03/15  5:00 PM  Result Value Ref Range Status   Specimen Description ABSCESS BACK  Final   Special Requests NONE  Final   Gram Stain   Final    FEW WBC PRESENT,BOTH PMN AND MONONUCLEAR NO ORGANISMS SEEN    Culture NO GROWTH < 24 HOURS  Final   Report Status PENDING  Incomplete    Impression/Plan:  1. Fever - remains afebrile now.  Continue zosyn for now pending cultures.  Fluid in back does not appear infected on aspiration, cultures remain negative to date.  2. Confusion - intermittent.  Unknown etiology.  Does not appear to be consistent with #1.  May need MRI of head, ? Encephalitis, encephalopathy? Mild hypernatremia, mild uremia.  Possibly a medication effect? 3. Neutropenia - recent and had been on vancomycin and ceftriaxone, both of which can cause this.  Wil continue to hold these. 4. Discitis - he was being treated for discitis but repeat MRI does not suggest it was discitis compared to initial MRI.  I do not feel this needs further treatment.

## 2015-10-05 DIAGNOSIS — I481 Persistent atrial fibrillation: Secondary | ICD-10-CM

## 2015-10-05 DIAGNOSIS — I248 Other forms of acute ischemic heart disease: Secondary | ICD-10-CM

## 2015-10-05 DIAGNOSIS — L0291 Cutaneous abscess, unspecified: Secondary | ICD-10-CM

## 2015-10-05 DIAGNOSIS — N179 Acute kidney failure, unspecified: Secondary | ICD-10-CM | POA: Insufficient documentation

## 2015-10-05 LAB — CBC
HCT: 32 % — ABNORMAL LOW (ref 39.0–52.0)
Hemoglobin: 10 g/dL — ABNORMAL LOW (ref 13.0–17.0)
MCH: 28.6 pg (ref 26.0–34.0)
MCHC: 31.3 g/dL (ref 30.0–36.0)
MCV: 91.4 fL (ref 78.0–100.0)
Platelets: 261 10*3/uL (ref 150–400)
RBC: 3.5 MIL/uL — ABNORMAL LOW (ref 4.22–5.81)
RDW: 15.8 % — ABNORMAL HIGH (ref 11.5–15.5)
WBC: 3.2 10*3/uL — ABNORMAL LOW (ref 4.0–10.5)

## 2015-10-05 LAB — GLUCOSE, CAPILLARY
Glucose-Capillary: 100 mg/dL — ABNORMAL HIGH (ref 65–99)
Glucose-Capillary: 127 mg/dL — ABNORMAL HIGH (ref 65–99)
Glucose-Capillary: 127 mg/dL — ABNORMAL HIGH (ref 65–99)
Glucose-Capillary: 142 mg/dL — ABNORMAL HIGH (ref 65–99)
Glucose-Capillary: 145 mg/dL — ABNORMAL HIGH (ref 65–99)
Glucose-Capillary: 63 mg/dL — ABNORMAL LOW (ref 65–99)

## 2015-10-05 LAB — BASIC METABOLIC PANEL
Anion gap: 6 (ref 5–15)
BUN: 18 mg/dL (ref 6–20)
CO2: 24 mmol/L (ref 22–32)
Calcium: 8.9 mg/dL (ref 8.9–10.3)
Chloride: 116 mmol/L — ABNORMAL HIGH (ref 101–111)
Creatinine, Ser: 1.85 mg/dL — ABNORMAL HIGH (ref 0.61–1.24)
GFR calc Af Amer: 39 mL/min — ABNORMAL LOW (ref >60)
GFR calc non Af Amer: 34 mL/min — ABNORMAL LOW (ref >60)
Glucose, Bld: 157 mg/dL — ABNORMAL HIGH (ref 65–99)
Potassium: 3.6 mmol/L (ref 3.5–5.1)
Sodium: 146 mmol/L — ABNORMAL HIGH (ref 135–145)

## 2015-10-05 MED ORDER — INSULIN GLARGINE 100 UNIT/ML ~~LOC~~ SOLN
10.0000 [IU] | Freq: Every day | SUBCUTANEOUS | Status: DC
  Administered 2015-10-05: 10 [IU] via SUBCUTANEOUS
  Filled 2015-10-05 (×2): qty 0.1

## 2015-10-05 MED ORDER — DEXTROSE 50 % IV SOLN
INTRAVENOUS | Status: AC
  Administered 2015-10-05: 25 mL
  Filled 2015-10-05: qty 50

## 2015-10-05 NOTE — Progress Notes (Signed)
Daughter, Jackelyn Poling, would like an update from MD.

## 2015-10-05 NOTE — Clinical Social Work Note (Signed)
Clinical Social Work Assessment  Patient Details  Name: Jacob [name] MRN: [name] Date of Birth: 22-May-1940  Date of referral:  10/05/15               Reason for consult:  Facility Placement                Permission sought to share information with:  Facility Sport and exercise psychologist, Family Supports Permission granted to share information::  Yes, Verbal Permission Granted  Name::     Adonis Huguenin  Agency::     Relationship::  Spouse  Contact Information:  [phone]  Housing/Transportation Living arrangements for the past 2 months:  [place], [place] of Information:  Spouse Patient Interpreter Needed:  None Criminal Activity/Legal Involvement Pertinent to Current Situation/Hospitalization:  No - Comment as needed Significant Relationships:  Adult Children, Spouse Lives with:  Spouse Do you feel safe going back to the place where you live?  Yes Need for family participation in patient care:  Yes (Comment)  Care giving concerns:  CSW received referral for return to SNF at discharge. Patient is disoriented. CSW spoke with patient's wife. Patient is from [facility] where he was getting short term rehab. She stated she is not pleased with what happens there on the weekend and at night. She would like to take him home if possible and is waiting to see how pt moves when PT works with him. CSW to continue to follow and assist with discharge planning needs.   Social Worker assessment / plan:  Following to see if patient will return home at discharge.   Employment status:  Retired Forensic scientist:  Managed Care PT Recommendations:  Not assessed at this time Information / Referral to community resources:  [place]  Patient/Family's Response to care:  [name] is not pleased with SNF care and her goal is to get patient home.  Patient/Family's Understanding of and Emotional Response to Diagnosis, Current Treatment, and  Prognosis:  Patient is realistic regarding therapy needs. No questions/concerns about plan or treatment.    Emotional Assessment Appearance:  Appears stated age Attitude/Demeanor/Rapport:  Unable to Assess Affect (typically observed):  Unable to Assess Orientation:  Oriented to Self Alcohol / Substance use:  Not Applicable Psych involvement (Current and /or in the community):  No (Comment)  Discharge Needs  Concerns to be addressed:  Care Coordination Readmission within the last 30 days:  No Current discharge risk:  None Barriers to Discharge:  Continued Medical Work up   [employer], [place] 10/05/2015, 11:40 AM

## 2015-10-05 NOTE — Evaluation (Signed)
Physical Therapy Evaluation Patient Details Name: Jacob [name] MRN: [id number] DOB: Jul 22, 1940 Today's Date: 10/05/2015   History of Present Illness  Pt is a 75 y/o M from [place] facility who presented with AMS.  Pt was recently hospitalized with discitis. MRI spine on admission showed fluid collection and fluid collection done 7/20. Admitting dx: sepsis of unclear source. Pt's PMH includes polymyalgia rheumatica, COPD, anemia, anxiety, hepatitis, CABG, lumbar laminectomy/decompression microdiscectomy 09/06/15.      Clinical Impression  Pt admitted with above diagnosis. Pt currently with functional limitations due to the deficits listed below (see PT Problem List). Mr. Kinkade was very lethargic today, keeping his eyes closed unless stimulated.  Unsure of pt's PLOF as from SNF but during last hospital stay he required mod +2 assist for sit>stand transfer.  Unable to attempt transfer today due to generalized weakness (Lt>Rt) and lethargy. Pt will benefit from skilled PT to increase their independence and safety with mobility to allow discharge to the venue listed below.      Follow Up Recommendations SNF;Supervision/Assistance - 24 hour    Equipment Recommendations  Other (comment) (TBD at next venue of care)    Recommendations for Other Services OT consult     Precautions / Restrictions Precautions Precautions: Fall Restrictions Weight Bearing Restrictions: No      Mobility  Bed Mobility               General bed mobility comments: Pt sitting in recliner chair upon PT arrival  Transfers                 General transfer comment: Unable to attempt due to pt's level of arousal.  Ambulation/Gait                Stairs            Wheelchair Mobility    Modified Rankin (Stroke Patients Only)       Balance Overall balance assessment: Needs assistance Sitting-balance support: Bilateral upper extremity supported Sitting balance-Leahy Scale:  Zero Sitting balance - Comments: Pt unable to flex trunk to achieve sitting upright Postural control: Posterior lean                                   Pertinent Vitals/Pain Pain Assessment: Faces Faces Pain Scale: Hurts even more Pain Location: Lt LE hanging off chair upon PT arrival with pt moaning Pain Descriptors / Indicators: Moaning Pain Intervention(s): Repositioned;Limited activity within patient's tolerance;Monitored during session    Home Living Family/patient expects to be discharged to:: Skilled nursing facility                 Additional Comments: pt went to [facility] in may after first back surgery and then d/c'd home 6/8.  Pt admitted for back surgery again at end of June and was d/c to [facility].    Prior Function Level of Independence: Needs assistance   Gait / Transfers Assistance Needed: Per PT note during last hospital stay pt required mod +2 assist for sit>stand transfer.  Pt received today in chair after nursing staff used lift.  Unsure of amount of assist pt required PTA at [facility].  ADL's / Homemaking Assistance Needed: Unsure of assist need at [facility].  Likely needed assist with bathing, dressing.        Hand Dominance   Dominant Hand: Right    Extremity/Trunk Assessment   Upper Extremity Assessment: Generalized  weakness           Lower Extremity Assessment: RLE deficits/detail;LLE deficits/detail RLE Deficits / Details: Strength grossly 3/5 LLE Deficits / Details: Strength grossly 2/5  Cervical / Trunk Assessment: Kyphotic  Communication   Communication: Other (comment) (unintelligible)  Cognition Arousal/Alertness: Lethargic Behavior During Therapy: Flat affect Overall Cognitive Status: No family/caregiver present to determine baseline cognitive functioning                      General Comments General comments (skin integrity, edema, etc.): Pt very lethargic and keeps eyes closed when not  stimulated.    Exercises General Exercises - Lower Extremity Ankle Circles/Pumps: PROM;Both;10 reps;Seated Quad Sets: Strengthening;Both;5 reps;Seated Heel Slides: AAROM;Both;5 reps;Seated Straight Leg Raises: AAROM;Both;5 reps;Seated      Assessment/Plan    PT Assessment Patient needs continued PT services  PT Diagnosis Difficulty walking;Generalized weakness;Altered mental status   PT Problem List Decreased strength;Decreased activity tolerance;Decreased mobility;Decreased balance;Decreased cognition;Decreased knowledge of use of DME;Decreased safety awareness  PT Treatment Interventions DME instruction;Gait training;Functional mobility training;Therapeutic activities;Therapeutic exercise;Balance training;Neuromuscular re-education;Cognitive remediation;Patient/family education;Wheelchair mobility training   PT Goals (Current goals can be found in the Care Plan section) Acute Rehab PT Goals Patient Stated Goal: pt unable to state PT Goal Formulation: Patient unable to participate in goal setting Time For Goal Achievement: 10/19/15 Potential to Achieve Goals: Fair    Frequency Min 2X/week   Barriers to discharge        Co-evaluation               End of Session   Activity Tolerance: Patient limited by lethargy Patient left: in chair;with call bell/phone within reach;with chair alarm set Nurse Communication: Mobility status;Need for lift equipment         Time: [id number] PT Time Calculation (min) (ACUTE ONLY): 18 min   Charges:   PT Evaluation $PT Eval Moderate Complexity: 1 Procedure     PT G Codes:       Collie Siad PT, DPT  Pager: [phone] Phone: [phone] 10/05/2015, 1:41 PM

## 2015-10-05 NOTE — Progress Notes (Signed)
Primary cardiologist: Dr. Landry Corporal  Seen for followup: Atrial fibrillation  Subjective:    Patient in bedside chair. Sleeping initially but wakes up. Complains of back pain.  Objective:   Temp:  [97.8 F (36.6 C)-98.8 F (37.1 C)] 97.8 F (36.6 C) (07/22 0800) Pulse Rate:  [82-117] 89 (07/22 0820) Resp:  [17-27] 22 (07/22 0800) BP: (155-173)/(70-91) 155/87 mmHg (07/22 0820) SpO2:  [97 %-100 %] 100 % (07/22 0800) Last BM Date: 10/03/15  Filed Weights   10/02/15 0950 10/03/15 0522 10/04/15 0500  Weight: 193 lb 5.5 oz (87.7 kg) 195 lb 5.2 oz (88.6 kg) 200 lb 6.4 oz (90.9 kg)    Intake/Output Summary (Last 24 hours) at 10/05/15 1206 Last data filed at 10/05/15 0925  Gross per 24 hour  Intake   1228 ml  Output   1800 ml  Net   -572 ml    Telemetry: Rate-controlled atrial fibrillation.  Exam:  General: Elderly male in no distress.  Lungs: Clear, nonlabored.  Cardiac: Irregularly irregular. No gallop.  Extremities: Trace ankle edema.  Lab Results:  Basic Metabolic Panel:  Recent Labs Lab 10/01/15 2145 10/02/15 0600 10/03/15 0725 10/04/15 0101  NA 149* 147* 144 147*  K 2.9* 3.6 3.3* 4.1  CL 114* 114* 113* 118*  CO2 26 22 23  21*  GLUCOSE 159* 255* 269* 174*  BUN 32* 33* 38* 32*  CREATININE 2.33* 2.28* 2.36* 2.26*  CALCIUM 9.2 8.2* 8.4* 8.6*  MG 1.7 1.6* 1.8  --     Liver Function Tests:  Recent Labs Lab 10/01/15 2145 10/02/15 0600 10/04/15 0101  AST 14* 12* 13*  ALT 14* 13* 14*  ALKPHOS 117 99 98  BILITOT 1.7* 1.4* 1.1  PROT 5.3* 4.5* 5.1*  ALBUMIN 2.5* 2.1* 2.3*    CBC:  Recent Labs Lab 10/01/15 2145 10/02/15 0600 10/04/15 0101  WBC 1.6* 1.8* 1.9*  HGB 10.1* 9.1* 10.1*  HCT 31.0* 28.1* 31.9*  MCV 90.6 91.8 91.7  PLT 218 210 257    Cardiac Enzymes:  Recent Labs Lab 10/03/15 1022 10/03/15 1925 10/04/15 0101  TROPONINI 0.64* 0.57* 0.51*    Echocardiogram 08/29/2015: Study Conclusions  - Left ventricle: The  cavity size was mildly dilated. There was  mild concentric hypertrophy. Systolic function was normal. The  estimated ejection fraction was in the range of 55% to 60%.  Doppler parameters are consistent with abnormal left ventricular  relaxation (grade 1 diastolic dysfunction). - Aortic valve: Transvalvular velocity was within the normal range.  There was no stenosis. There was no regurgitation. - Mitral valve: Transvalvular velocity was within the normal range.  There was no evidence for stenosis. There was no regurgitation. - Right ventricle: The cavity size was normal. Wall thickness was  normal. Systolic function was normal. - Tricuspid valve: There was trivial regurgitation. - Pulmonary arteries: Systolic pressure was within the normal  range. PA peak pressure: 23 mm Hg (S).   Medications:   Scheduled Medications: . amiodarone  200 mg Oral BID  . antiseptic oral rinse  7 mL Mouth Rinse q12n4p  . chlorhexidine  15 mL Mouth Rinse BID  . Chlorhexidine Gluconate Cloth  6 each Topical Q0600  . cycloSPORINE  1 drop Both Eyes BID  . finasteride  5 mg Oral QHS  . heparin  5,000 Units Subcutaneous Q8H  . insulin aspart  0-15 Units Subcutaneous TID WC  . insulin glargine  14 Units Subcutaneous QHS  . metoprolol tartrate  25 mg Oral TID  .  mometasone-formoterol  2 puff Inhalation BID  . mupirocin ointment  1 application Nasal BID  . pilocarpine  1 drop Both Eyes TID  . piperacillin-tazobactam (ZOSYN)  IV  3.375 g Intravenous Q8H  . predniSONE  7 mg Oral Q breakfast  . sodium chloride flush  3 mL Intravenous Q12H  . tamsulosin  0.4 mg Oral QHS     PRN Medications:  acetaminophen **OR** acetaminophen, albuterol, fentaNYL (SUBLIMAZE) injection, ipratropium, ondansetron **OR** ondansetron (ZOFRAN) IV   Assessment:   1. Paroxysmal to persistent atrial fibrillation, currently rate controlled on combination of oral amiodarone and metoprolol. CHADSVASC score is 5.  Anticoagulation recommended per Dr. Wynonia Lawman once patient stable from the perspective of other active comorbidities.  2. Demand ischemia in the setting of known CAD and acute illness. No chest pain.   Plan/Discussion:    Continue amiodarone and metoprolol for now. As outlined above, consider oral anticoagulation ultimately.   Satira Sark, M.D., F.A.C.C.

## 2015-10-05 NOTE — Progress Notes (Signed)
Pharmacy Antibiotic Note  Jacob [name] is a 75 y.o. male admitted on 10/01/2015 with AMS/afib/rule out sepsis.  Pharmacy has been consulted for Zosyn dosing for sepsis, r/o discitis. Back abscess cx ngtd, BCx ngtd, UCx yeast, afeb, WBC low.  Plan: -Continue Zosyn 3.375 g IV q8h to be infused over 4 hours -Trend WBC, temp, renal function  -F/u culture data  Temp (24hrs), Avg:98.2 F (36.8 C), Min:97.6 F (36.4 C), Max:98.8 F (37.1 C)   Recent Labs Lab 10/01/15 2145 10/02/15 0009 10/02/15 0317 10/02/15 0600 10/03/15 0725 10/04/15 0101  WBC 1.6*  --   --  1.8*  --  1.9*  CREATININE 2.33*  --   --  2.28* 2.36* 2.26*  LATICACIDVEN  --  1.53 1.09  --   --   --     Estimated Creatinine Clearance: 32.6 mL/min (by C-G formula based on Cr of 2.26).    Allergies  Allergen Reactions  . Ace Inhibitors Other (See Comments)    Probably nausea and vomiting per patient   . Actonel [Risedronate] Nausea And Vomiting  . Ciprocinonide [Fluocinolone] Other (See Comments)    Probably nausea and vomiting per patient  . Flunisolide Other (See Comments)    Probably nausea and vomiting per patient   . Metformin And Related Other (See Comments)    Probably nausea and vomiting per patient   . Sertraline Other (See Comments)    Probably nausea and vomiting per patient   . Sulindac Other (See Comments)    Probably nausea and vomiting per patient   . Terazosin Other (See Comments)    Probably nausea and vomiting per patient     Renold Genta, PharmD, BCPS Clinical Pharmacist Pager: [phone] 10/05/2015 7:53 AM

## 2015-10-05 NOTE — Progress Notes (Signed)
PROGRESS NOTE    Jacob [name]  [id number] DOB: [birth year] DOA: 10/01/2015 PCP: No primary care provider on file.    Brief Narrative: Jacob [name] is a 75 y.o. male with medical history significant of CAD, CABG, IDDM, COPD, dyslipidemia, RCC, PMR, HTN, CKD, OSA , lumbar stenosis s/p correction, and recently was hospitalized with discitis,  was on rocephin and vancomycin till 7/13, presents for altered mental status. On arrival to ED, he was found to be febrile, tachycardic, hypotensive, in a fib with RVR, . He was admitted to step down for further management.  Cardiology and PCCM consulted.  PCCM signed off recommending a repeat MRI of the lumbar spine. MRI spine showed fluid collection , suspect abscess, NS and IR consulted for further eval.  CT guided aspiration of the fluid collection done on 7/20 and cultures are pending. No organisms seen.   Assessment & Plan:   Principal Problem:   Sepsis ([place]) Active Problems:   Diabetes mellitus, type 2 ([place])   CHF with left ventricular diastolic dysfunction, NYHA class 2 (HCC)   Acute encephalopathy   Elevated troponin   Acute kidney injury superimposed on chronic kidney disease (HCC)   Atrial fibrillation with RVR ([facility])   Hypotension  Sepsis of unclear source possibly from health care associated pneumonia vs recurrent discitis.  CXR shows left lobe air space consolidation.  MRI of the lumbar spine ordered to further evaluate the discitis.  It showed 1.4 x 2.5 x 3.3 cm rim enhancing paraspinal fluid collection concerning for abscess. 7 x 15 mm fluid collection in RIGHT laminectomy defect equivocal for abscess, and could represent seroma or possibly hematoma. IR consulted and he underwent CT guided aspiration of the fluid collection , the aspirate is negative for organisms, few wbc seen and cultures are pending.  He just completed 4 weeks of vancomycin and rocephin on 7/13.  He was started on IV vancomycin and IV zosyn, IV azithromycin  on admission , later transitioned to zosyn alone, ID has discontinued the vancomycin and azithromycin.  Lactic acid is normalized, trend pro calcitonin.  Blood cultures  and pending, negative so far.  Requested neuro surgery consult in view of the abnormal MRI, suggested probably not an abscess as the cultures have been negative.  He is afebrile and repeat labs to check WBC count are pending.  Repeat CXR shows some CHF,and some mild interstitial edema bilaterally, but he does not appear to be fluid overloaded clinically. We have stopped the IV fluids and encourage him to eat.     Atrial fibrillation with RVR: Rate is better controlled after starting amiodarone, transitioned to po amiodarone. Serial troponins are elevated and peaked at 0.9  and Dr Wynonia Lawman on board for further recommendations.  Dig  And beta blocker added by cardiology. Awaiting cardiology recommendations regarding anticoagulation.   Acute kidney injury; Unclear etiology, sepsis, dehydration.  UA shows SMALL LEUKOCYTOSIS with few bacteria and wbc.  Urine cultures done , show yeast, one dose of diflucan ordered.  US renal ordered and currently on IV fluids. US renal does not show any hydronephrosis.  Not much improvement with fluids. Pt is incontinent and urine output couldn't be measure appropriately.  Monitor renal parameters. Repeat labs are pending.    IDDM: SLP  EVAL recommended restarting  Regular diet with thin liquids when he is more awake.  Resume his lantus and SSI.  CBG (last 3)   Recent Labs  10/04/15 2354 10/05/15 0334 10/05/15 0830  GLUCAP 145* 100*  63*   Poor po intake, would decrease the lantus to 10 units because of hislow cbgs.   Nutrition: Regular diet with supplementations.   Hypernatremia: Probably from dehydration and Poor po intake.    Leukopenia,  Unclear etiology. Probably from IV antibiotics.  Improving.  Monitor.   anemia: Baseline hemoglobin around 10,  Stable. Monitor.    COPD: No wheezing heard, resume Symbicort and duo nebs as needed.   PMR:  On  Maintenance dose of prednisone, continue the same.   Hypertension: controlled. Resume BB.   Hypokalemia: replete as needed. Repeat in am shows improvement.    DVT prophylaxis: sq heparin. Code Status: do not intubate only. Family Communication: none at bedside Disposition Plan: pending further work up.   Consultants:   Cardiology Dr Wynonia Lawman.  PCCM  ID Dr Linus Salmons  IR  Neuro surgery. Dr Parks Neptune.  Procedures: MRI LUMBAR SPINE.    Antimicrobials:  Vancomycin  Zosyn 7/19 Azithromycin 7/19  Subjective: Mild confusion, no agitation overnight.  Sitting in the chair .  Objective: Filed Vitals:   10/05/15 0400 10/05/15 0500 10/05/15 0800 10/05/15 0820  BP: 162/72 159/91 155/87 155/87  Pulse:   90 89  Temp:   97.8 F (36.6 C)   TempSrc:   Oral   Resp: 17  22   Height:      Weight:      SpO2: 100%  100%     Intake/Output Summary (Last 24 hours) at 10/05/15 1229 Last data filed at 10/05/15 0925  Gross per 24 hour  Intake   1228 ml  Output   1800 ml  Net   -572 ml   Filed Weights   10/02/15 0950 10/03/15 0522 10/04/15 0500  Weight: 87.7 kg (193 lb 5.5 oz) 88.6 kg (195 lb 5.2 oz) 90.9 kg (200 lb 6.4 oz)    Examination:  General exam: sleeping but arousable. Appears comfortable.  Respiratory system: diminished at bases, no wheezing or rhonchi.  Cardiovascular system: irregular, s1s2, rate controlled.  Gastrointestinal system: Abdomen is nondistended, soft and nontender. No organomegaly or masses felt. Normal bowel sounds heard. Central nervous system: alert but still confused, appear to be at baseline.  Skin: No rashes, lesions or ulcers     Data Reviewed: I have personally reviewed following labs and imaging studies  CBC:  Recent Labs Lab 10/01/15 2145 10/02/15 0600 10/04/15 0101  WBC 1.6* 1.8* 1.9*  NEUTROABS 0.1*  --   --   HGB 10.1* 9.1* 10.1*  HCT 31.0* 28.1*  31.9*  MCV 90.6 91.8 91.7  PLT 218 210 [id number]   Basic Metabolic Panel:  Recent Labs Lab 10/01/15 2145 10/02/15 0600 10/03/15 0725 10/04/15 0101  NA 149* 147* 144 147*  K 2.9* 3.6 3.3* 4.1  CL 114* 114* 113* 118*  CO2 26 22 23  21*  GLUCOSE 159* 255* 269* 174*  BUN 32* 33* 38* 32*  CREATININE 2.33* 2.28* 2.36* 2.26*  CALCIUM 9.2 8.2* 8.4* 8.6*  MG 1.7 1.6* 1.8  --    GFR: Estimated Creatinine Clearance: 32.6 mL/min (by C-G formula based on Cr of 2.26). Liver Function Tests:  Recent Labs Lab 10/01/15 2145 10/02/15 0600 10/04/15 0101  AST 14* 12* 13*  ALT 14* 13* 14*  ALKPHOS 117 99 98  BILITOT 1.7* 1.4* 1.1  PROT 5.3* 4.5* 5.1*  ALBUMIN 2.5* 2.1* 2.3*   No results for input(s): LIPASE, AMYLASE in the last 168 hours. No results for input(s): AMMONIA in the last 168 hours. Coagulation Profile:  Recent Labs Lab 10/03/15 1925  INR 1.43   Cardiac Enzymes:  Recent Labs Lab 10/02/15 0948 10/02/15 1424 10/03/15 1022 10/03/15 1925 10/04/15 0101  TROPONINI 1.02* 0.86* 0.64* 0.57* 0.51*   BNP (last 3 results) No results for input(s): PROBNP in the last 8760 hours. HbA1C: No results for input(s): HGBA1C in the last 72 hours. CBG:  Recent Labs Lab 10/04/15 1611 10/04/15 1946 10/04/15 2354 10/05/15 0334 10/05/15 0830  GLUCAP 122* 131* 145* 100* 63*   Lipid Profile: No results for input(s): CHOL, HDL, LDLCALC, TRIG, CHOLHDL, LDLDIRECT in the last 72 hours. Thyroid Function Tests: No results for input(s): TSH, T4TOTAL, FREET4, T3FREE, THYROIDAB in the last 72 hours. Anemia Panel: No results for input(s): VITAMINB12, FOLATE, FERRITIN, TIBC, IRON, RETICCTPCT in the last 72 hours. Sepsis Labs:  Recent Labs Lab 10/02/15 0009 10/02/15 0317  LATICACIDVEN 1.53 1.09    Recent Results (from the past 240 hour(s))  Culture, blood (Routine X 2) w Reflex to ID Panel     Status: None (Preliminary result)   Collection Time: 10/01/15 11:40 PM  Result Value Ref  Range Status   Specimen Description BLOOD LEFT ARM  Final   Special Requests   Final    BOTTLES DRAWN AEROBIC AND ANAEROBIC 10CC PT ON VANC ZOSYN   Culture NO GROWTH 2 DAYS  Final   Report Status PENDING  Incomplete  Culture, blood (Routine X 2) w Reflex to ID Panel     Status: None (Preliminary result)   Collection Time: 10/01/15 11:45 PM  Result Value Ref Range Status   Specimen Description BLOOD LEFT HAND  Final   Special Requests IN PEDIATRIC BOTTLE 4CC  Final   Culture NO GROWTH 2 DAYS  Final   Report Status PENDING  Incomplete  Urine culture     Status: Abnormal   Collection Time: 10/01/15 11:57 PM  Result Value Ref Range Status   Specimen Description URINE, RANDOM  Final   Special Requests NONE  Final   Culture >=100,000 COLONIES/mL YEAST (A)  Final   Report Status 10/03/2015 FINAL  Final  MRSA PCR Screening     Status: Abnormal   Collection Time: 10/02/15  9:40 AM  Result Value Ref Range Status   MRSA by PCR POSITIVE (A) NEGATIVE Final    Comment:        The GeneXpert MRSA Assay (FDA approved for NASAL specimens only), is one component of a comprehensive MRSA colonization surveillance program. It is not intended to diagnose MRSA infection nor to guide or monitor treatment for MRSA infections. RESULT CALLED TO, READ BACK BY AND VERIFIED WITH: Bernarda Caffey RN 11:45 10/02/15 ([email or web address])   Aerobic/Anaerobic Culture (surgical/deep wound)     Status: None (Preliminary result)   Collection Time: 10/03/15  5:00 PM  Result Value Ref Range Status   Specimen Description ABSCESS BACK  Final   Special Requests NONE  Final   Gram Stain   Final    FEW WBC PRESENT,BOTH PMN AND MONONUCLEAR NO ORGANISMS SEEN    Culture NO GROWTH < 24 HOURS  Final   Report Status PENDING  Incomplete         Radiology Studies: Ct Aspiration  10/04/2015  INDICATION: History of redo right L3 hemilaminectomy, now with indeterminate fluid collection about the posterior aspect of the operative  site. Please perform CT-guided aspiration for diagnostic purposes. EXAM: CT GUIDANCE NEEDLE PLACEMENT COMPARISON:  Fluoroscopic guided L3-L4 disc aspiration - 08/30/2015 MEDICATIONS: The patient is currently admitted to the hospital  and receiving intravenous antibiotics. The antibiotics were administered within an appropriate time frame prior to the initiation of the procedure. ANESTHESIA/SEDATION: None CONTRAST:  None COMPLICATIONS: None immediate. PROCEDURE: Informed written consent was obtained from the patient's white after a discussion of the risks, benefits and alternatives to treatment. The patient was placed left lateral decubitus on the CT gantry and a pre procedural CT was performed re-demonstrating the known abscess/fluid collection within the subcutaneous tissues posterior to the L3 operative site. The procedure was planned. A timeout was performed prior to the initiation of the procedure. The skin overlying the posterior aspect of the lower back was prepped and draped in the usual sterile fashion. The overlying soft tissues were anesthetized with 1% lidocaine with epinephrine. Appropriate trajectory was planned with the use of a 22 gauge spinal needle. An 18 gauge trocar needle was advanced into the fluid collection. Appropriate positioning was confirmed with CT imaging. Approximately 2 cc of blood tinged, slightly cloudy fluid was aspirated. All aspirated fluid was capped and sent to the laboratory for analysis. A dressing was placed. The patient tolerated the procedure well without immediate post procedural complication. IMPRESSION: Title successful CT guided aspiration of approximately 2 cc of blood tinged, slightly cloudy fluid from indeterminate fluid collection posterior to the L3 operative site. Samples were sent to the laboratory as requested by the ordering clinical team. Electronically Signed   By: Sandi Mariscal M.D.   On: 10/04/2015 08:29   Dg Chest Port 1 View  10/04/2015  CLINICAL DATA:   Follow-up pneumonia, per family. EXAM: PORTABLE CHEST 1 VIEW COMPARISON:  Chest x-rays dated 10/01/2015 and 08/05/2015 FINDINGS: Cardiomegaly is stable. Overall cardiomediastinal silhouette is stable in size and configuration. Atherosclerotic changes again noted at the aortic arch. Median sternotomy wires appear intact and stable in alignment, with presumed surgical changes of CABG. Increased opacity is seen within the right upper lung, possibly accentuated by oblique patient positioning. Probable mild interstitial edema bilaterally, similar to older exams. No pleural effusion or pneumothorax seen IMPRESSION: 1. Increased opacity within the right upper lung, most likely confluent edema, possibly accentuated by the oblique patient positioning. If febrile, this could represent developing pneumonia. 2. Cardiomegaly and mild bilateral interstitial edema suggesting mild CHF/volume overload. Electronically Signed   By: Franki Cabot M.D.   On: 10/04/2015 19:38        Scheduled Meds: . amiodarone  200 mg Oral BID  . antiseptic oral rinse  7 mL Mouth Rinse q12n4p  . chlorhexidine  15 mL Mouth Rinse BID  . Chlorhexidine Gluconate Cloth  6 each Topical Q0600  . cycloSPORINE  1 drop Both Eyes BID  . finasteride  5 mg Oral QHS  . heparin  5,000 Units Subcutaneous Q8H  . insulin aspart  0-15 Units Subcutaneous TID WC  . insulin glargine  14 Units Subcutaneous QHS  . metoprolol tartrate  25 mg Oral TID  . mometasone-formoterol  2 puff Inhalation BID  . mupirocin ointment  1 application Nasal BID  . pilocarpine  1 drop Both Eyes TID  . piperacillin-tazobactam (ZOSYN)  IV  3.375 g Intravenous Q8H  . predniSONE  7 mg Oral Q breakfast  . sodium chloride flush  3 mL Intravenous Q12H  . tamsulosin  0.4 mg Oral QHS   Continuous Infusions:     LOS: 3 days    Time spent: 30 minutes.     Hosie Poisson, MD [facility] Pager [phone]   If 7PM-7AM, please contact  night-coverage [email or web address] Password [email or web address]  10/05/2015, 12:29 PM

## 2015-10-06 ENCOUNTER — Inpatient Hospital Stay (HOSPITAL_COMMUNITY): Payer: PPO

## 2015-10-06 ENCOUNTER — Encounter (HOSPITAL_COMMUNITY): Payer: Self-pay | Admitting: Radiology

## 2015-10-06 DIAGNOSIS — R410 Disorientation, unspecified: Secondary | ICD-10-CM

## 2015-10-06 LAB — BASIC METABOLIC PANEL
Anion gap: 8 (ref 5–15)
BUN: 16 mg/dL (ref 6–20)
CO2: 24 mmol/L (ref 22–32)
Calcium: 8.7 mg/dL — ABNORMAL LOW (ref 8.9–10.3)
Chloride: 115 mmol/L — ABNORMAL HIGH (ref 101–111)
Creatinine, Ser: 1.85 mg/dL — ABNORMAL HIGH (ref 0.61–1.24)
GFR calc Af Amer: 39 mL/min — ABNORMAL LOW (ref >60)
GFR calc non Af Amer: 34 mL/min — ABNORMAL LOW (ref >60)
Glucose, Bld: 76 mg/dL (ref 65–99)
Potassium: 3.1 mmol/L — ABNORMAL LOW (ref 3.5–5.1)
Sodium: 147 mmol/L — ABNORMAL HIGH (ref 135–145)

## 2015-10-06 LAB — GLUCOSE, CAPILLARY
Glucose-Capillary: 107 mg/dL — ABNORMAL HIGH (ref 65–99)
Glucose-Capillary: 109 mg/dL — ABNORMAL HIGH (ref 65–99)
Glucose-Capillary: 110 mg/dL — ABNORMAL HIGH (ref 65–99)
Glucose-Capillary: 121 mg/dL — ABNORMAL HIGH (ref 65–99)
Glucose-Capillary: 123 mg/dL — ABNORMAL HIGH (ref 65–99)
Glucose-Capillary: 57 mg/dL — ABNORMAL LOW (ref 65–99)
Glucose-Capillary: 61 mg/dL — ABNORMAL LOW (ref 65–99)
Glucose-Capillary: 92 mg/dL (ref 65–99)

## 2015-10-06 MED ORDER — DEXTROSE 50 % IV SOLN
INTRAVENOUS | Status: AC
  Administered 2015-10-06: 50 mL
  Filled 2015-10-06: qty 50

## 2015-10-06 MED ORDER — LORAZEPAM 2 MG/ML IJ SOLN
1.0000 mg | Freq: Once | INTRAMUSCULAR | Status: AC
  Administered 2015-10-06: 2 mg via INTRAVENOUS
  Filled 2015-10-06: qty 1

## 2015-10-06 MED ORDER — DEXTROSE 5 % IV SOLN
INTRAVENOUS | Status: DC
  Administered 2015-10-06 – 2015-10-07 (×2): via INTRAVENOUS

## 2015-10-06 MED ORDER — DEXTROSE 50 % IV SOLN
INTRAVENOUS | Status: AC
  Administered 2015-10-06: 25 mL
  Filled 2015-10-06: qty 50

## 2015-10-06 MED ORDER — POTASSIUM CHLORIDE CRYS ER 20 MEQ PO TBCR
40.0000 meq | EXTENDED_RELEASE_TABLET | Freq: Two times a day (BID) | ORAL | Status: AC
  Administered 2015-10-06 (×2): 40 meq via ORAL
  Filled 2015-10-06 (×2): qty 2

## 2015-10-06 NOTE — Progress Notes (Signed)
PROGRESS NOTE    Jacob [name]  [id number] DOB: [birth year] DOA: 10/01/2015 PCP: No primary care provider on file.    Brief Narrative: Jacob [name] is a 75 y.o. male with medical history significant of CAD, CABG, IDDM, COPD, dyslipidemia, RCC, PMR, HTN, CKD, OSA , lumbar stenosis s/p correction, and recently was hospitalized with discitis,  was on rocephin and vancomycin till 7/13, presents for altered mental status. On arrival to ED, he was found to be febrile, tachycardic, hypotensive, in a fib with RVR, . He was admitted to step down for further management.  Cardiology and PCCM consulted.  PCCM signed off recommending a repeat MRI of the lumbar spine. MRI spine showed fluid collection , suspect abscess, NS and IR consulted for further eval.  CT guided aspiration of the fluid collection done on 7/20 and cultures are pending. No organisms seen.   Assessment & Plan:   Principal Problem:   Sepsis ([place]) Active Problems:   Diabetes mellitus, type 2 ([place])   CHF with left ventricular diastolic dysfunction, NYHA class 2 (HCC)   Acute encephalopathy   Elevated troponin   Acute kidney injury superimposed on chronic kidney disease (HCC)   Atrial fibrillation with RVR (HCC)   Hypotension   Abscess   AKI (acute kidney injury) ([place])  Sepsis of unclear source possibly from health care associated pneumonia vs recurrent discitis.  CXR shows left lobe air space consolidation.  MRI of the lumbar spine ordered to further evaluate the discitis.  It showed 1.4 x 2.5 x 3.3 cm rim enhancing paraspinal fluid collection concerning for abscess. 7 x 15 mm fluid collection in RIGHT laminectomy defect equivocal for abscess, and could represent seroma or possibly hematoma. IR consulted and he underwent CT guided aspiration of the fluid collection , the aspirate is negative for organisms, few wbc seen and cultures are pending.  He just completed 4 weeks of vancomycin and rocephin on 7/13.  He was started on  IV vancomycin and IV zosyn, IV azithromycin on admission , later transitioned to zosyn alone, ID has discontinued the vancomycin and azithromycin.  Lactic acid is normalized, trend pro calcitonin.  Blood cultures  and pending, negative so far.  Requested neuro surgery consult in view of the abnormal MRI, suggested probably not an abscess as the cultures have been negative.  He is afebrile and repeat labs to check WBC count are pending.  Repeat CXR shows some CHF,and some mild interstitial edema bilaterally, but he does not appear to be fluid overloaded clinically, increased opacity in the right upper lung suspicious for pneumonia.    Atrial fibrillation with RVR: Rate is better controlled after starting amiodarone, transitioned to po amiodarone. Serial troponins are elevated and peaked at 0.9  and Dr Wynonia Lawman on board for further recommendations.  Dig  And beta blocker added by cardiology. Awaiting cardiology recommendations regarding anticoagulation.    Acute kidney injury; Unclear etiology, sepsis, dehydration.  UA shows SMALL LEUKOCYTOSIS with few bacteria and wbc.  Urine cultures done , show yeast, one dose of diflucan ordered.  US renal ordered and currently on IV fluids. US renal does not show any hydronephrosis.  Not much improvement with fluids. Pt is incontinent and urine output couldn't be measure appropriately.  Monitor renal parameters. Repeat labs show improvement.  Baseline creatinine between 1.2 to 1.4, currently it has improved to 1.8.    IDDM: SLP  EVAL recommended restarting  Regular diet with thin liquids when he is more awake. He is more  awake and eating , but he continues to have hypoglycemia episodes, we will stop him lantus and watch him overnight and restart lantus when he is eating more than 50 % of his meals.   CBG (last 3)   Recent Labs  10/06/15 0809 10/06/15 0943 10/06/15 1202  GLUCAP 57* 110* 107*     Nutrition: Regular diet with supplementations.    Hypernatremia: Probably from dehydration and Poor po intake.  Hasn't improved in the last 3 days, will start him on slow dextrose fluids for 12 hours and repeat sodium levels in am.    Leukopenia,  Unclear etiology. Probably from IV antibiotics.  Improving.  Monitor.   anemia: Baseline hemoglobin around 10,  Stable. Monitor.   COPD: No wheezing heard, resume Symbicort and duo nebs as needed.   PMR:  On  Maintenance dose of prednisone, continue the same.   Hypertension: controlled. Resume BB.   Hypokalemia: replete as needed. Repeat in am shows improvement.   Acute encephalopathy with bouts of agitation, unable to walk: Initially thought be secondary to infection and metabolic .  Will get an MRI brain without contrast to rule out CVA.  Will also get an EEG , .   Hypertension: suboptimal.  Add prn hydralazine.      DVT prophylaxis: sq heparin. Code Status: do not intubate only. Family Communication: none at bedside, discussed with wife over the phone on 7/23. Disposition Plan: SNF when he is more stable.    Consultants:   Cardiology Dr Wynonia Lawman.  PCCM  ID Dr Linus Salmons  IR  Neuro surgery. Dr Parks Neptune.  Procedures: MRI LUMBAR SPINE.  MRI brain EEG.   Antimicrobials:  Vancomycin  Zosyn 7/19 Azithromycin 7/19  Subjective: STILL CONFUSED, drinking orange juice.  Objective: Vitals:   10/06/15 0449 10/06/15 0550 10/06/15 0750 10/06/15 1345  BP: (!) 173/78 (!) 165/82 (!) 165/72 (!) 169/76  Pulse: 85 90 79 82  Resp: 16  (!) 23   Temp: 98.6 F (37 C)  97.5 F (36.4 C)   TempSrc: Oral  Oral   SpO2: 97%  99%   Weight: 87.7 kg (193 lb 5.5 oz)     Height:        Intake/Output Summary (Last 24 hours) at 10/06/15 1403 Last data filed at 10/06/15 0949  Gross per 24 hour  Intake              103 ml  Output              675 ml  Net             -572 ml   Filed Weights   10/03/15 0522 10/04/15 0500 10/06/15 0449  Weight: 88.6 kg (195 lb 5.2 oz) 90.9 kg  (200 lb 6.4 oz) 87.7 kg (193 lb 5.5 oz)    Examination:  General exam: sleeping but arousable. Appears comfortable.  Respiratory system: diminished at bases, no wheezing or rhonchi.  Cardiovascular system: irregular, s1s2, rate controlled.  Gastrointestinal system: Abdomen is nondistended, soft and nontender. No organomegaly or masses felt. Normal bowel sounds heard. Central nervous system: alert but still confused, appear to be at baseline.  Skin: No rashes, lesions or ulcers     Data Reviewed: I have personally reviewed following labs and imaging studies  CBC:  Recent Labs Lab 10/01/15 2145 10/02/15 0600 10/04/15 0101 10/05/15 1520  WBC 1.6* 1.8* 1.9* 3.2*  NEUTROABS 0.1*  --   --   --   HGB 10.1* 9.1* 10.1* 10.0*  HCT 31.0* 28.1* 31.9* 32.0*  MCV 90.6 91.8 91.7 91.4  PLT 218 210 257 0000000   Basic Metabolic Panel:  Recent Labs Lab 10/01/15 2145 10/02/15 0600 10/03/15 0725 10/04/15 0101 10/05/15 1520 10/06/15 0603  NA 149* 147* 144 147* 146* 147*  K 2.9* 3.6 3.3* 4.1 3.6 3.1*  CL 114* 114* 113* 118* 116* 115*  CO2 26 22 23  21* 24 24  GLUCOSE 159* 255* 269* 174* 157* 76  BUN 32* 33* 38* 32* 18 16  CREATININE 2.33* 2.28* 2.36* 2.26* 1.85* 1.85*  CALCIUM 9.2 8.2* 8.4* 8.6* 8.9 8.7*  MG 1.7 1.6* 1.8  --   --   --    GFR: Estimated Creatinine Clearance: 36.7 mL/min (by C-G formula based on SCr of 1.85 mg/dL). Liver Function Tests:  Recent Labs Lab 10/01/15 2145 10/02/15 0600 10/04/15 0101  AST 14* 12* 13*  ALT 14* 13* 14*  ALKPHOS 117 99 98  BILITOT 1.7* 1.4* 1.1  PROT 5.3* 4.5* 5.1*  ALBUMIN 2.5* 2.1* 2.3*   No results for input(s): LIPASE, AMYLASE in the last 168 hours. No results for input(s): AMMONIA in the last 168 hours. Coagulation Profile:  Recent Labs Lab 10/03/15 1925  INR 1.43   Cardiac Enzymes:  Recent Labs Lab 10/02/15 0948 10/02/15 1424 10/03/15 1022 10/03/15 1925 10/04/15 0101  TROPONINI 1.02* 0.86* 0.64* 0.57* 0.51*    BNP (last 3 results) No results for input(s): PROBNP in the last 8760 hours. HbA1C: No results for input(s): HGBA1C in the last 72 hours. CBG:  Recent Labs Lab 10/06/15 0415 10/06/15 0443 10/06/15 0809 10/06/15 0943 10/06/15 1202  GLUCAP 61* 109* 57* 110* 107*   Lipid Profile: No results for input(s): CHOL, HDL, LDLCALC, TRIG, CHOLHDL, LDLDIRECT in the last 72 hours. Thyroid Function Tests: No results for input(s): TSH, T4TOTAL, FREET4, T3FREE, THYROIDAB in the last 72 hours. Anemia Panel: No results for input(s): VITAMINB12, FOLATE, FERRITIN, TIBC, IRON, RETICCTPCT in the last 72 hours. Sepsis Labs:  Recent Labs Lab 10/02/15 0009 10/02/15 0317  LATICACIDVEN 1.53 1.09    Recent Results (from the past 240 hour(s))  Culture, blood (Routine X 2) w Reflex to ID Panel     Status: None (Preliminary result)   Collection Time: 10/01/15 11:40 PM  Result Value Ref Range Status   Specimen Description BLOOD LEFT ARM  Final   Special Requests   Final    BOTTLES DRAWN AEROBIC AND ANAEROBIC 10CC PT ON VANC ZOSYN   Culture NO GROWTH 3 DAYS  Final   Report Status PENDING  Incomplete  Culture, blood (Routine X 2) w Reflex to ID Panel     Status: None (Preliminary result)   Collection Time: 10/01/15 11:45 PM  Result Value Ref Range Status   Specimen Description BLOOD LEFT HAND  Final   Special Requests IN PEDIATRIC BOTTLE 4CC  Final   Culture NO GROWTH 3 DAYS  Final   Report Status PENDING  Incomplete  Urine culture     Status: Abnormal   Collection Time: 10/01/15 11:57 PM  Result Value Ref Range Status   Specimen Description URINE, RANDOM  Final   Special Requests NONE  Final   Culture >=100,000 COLONIES/mL YEAST (A)  Final   Report Status 10/03/2015 FINAL  Final  MRSA PCR Screening     Status: Abnormal   Collection Time: 10/02/15  9:40 AM  Result Value Ref Range Status   MRSA by PCR POSITIVE (A) NEGATIVE Final    Comment:  The GeneXpert MRSA Assay (FDA approved for  NASAL specimens only), is one component of a comprehensive MRSA colonization surveillance program. It is not intended to diagnose MRSA infection nor to guide or monitor treatment for MRSA infections. RESULT CALLED TO, READ BACK BY AND VERIFIED WITH: Bernarda Caffey RN 11:45 10/02/15 ([email or web address])   Aerobic/Anaerobic Culture (surgical/deep wound)     Status: None (Preliminary result)   Collection Time: 10/03/15  5:00 PM  Result Value Ref Range Status   Specimen Description ABSCESS BACK  Final   Special Requests NONE  Final   Gram Stain   Final    FEW WBC PRESENT,BOTH PMN AND MONONUCLEAR NO ORGANISMS SEEN    Culture   Final    NO GROWTH 2 DAYS NO ANAEROBES ISOLATED; CULTURE IN PROGRESS FOR 5 DAYS   Report Status PENDING  Incomplete         Radiology Studies: Dg Chest Port 1 View  Result Date: 10/04/2015 CLINICAL DATA:  Follow-up pneumonia, per family. EXAM: PORTABLE CHEST 1 VIEW COMPARISON:  Chest x-rays dated 10/01/2015 and 08/05/2015 FINDINGS: Cardiomegaly is stable. Overall cardiomediastinal silhouette is stable in size and configuration. Atherosclerotic changes again noted at the aortic arch. Median sternotomy wires appear intact and stable in alignment, with presumed surgical changes of CABG. Increased opacity is seen within the right upper lung, possibly accentuated by oblique patient positioning. Probable mild interstitial edema bilaterally, similar to older exams. No pleural effusion or pneumothorax seen IMPRESSION: 1. Increased opacity within the right upper lung, most likely confluent edema, possibly accentuated by the oblique patient positioning. If febrile, this could represent developing pneumonia. 2. Cardiomegaly and mild bilateral interstitial edema suggesting mild CHF/volume overload. Electronically Signed   By: Franki Cabot M.D.   On: 10/04/2015 19:38        Scheduled Meds: . amiodarone  200 mg Oral BID  . antiseptic oral rinse  7 mL Mouth Rinse q12n4p  .  chlorhexidine  15 mL Mouth Rinse BID  . Chlorhexidine Gluconate Cloth  6 each Topical Q0600  . cycloSPORINE  1 drop Both Eyes BID  . finasteride  5 mg Oral QHS  . heparin  5,000 Units Subcutaneous Q8H  . insulin aspart  0-15 Units Subcutaneous TID WC  . metoprolol tartrate  25 mg Oral TID  . mometasone-formoterol  2 puff Inhalation BID  . mupirocin ointment  1 application Nasal BID  . pilocarpine  1 drop Both Eyes TID  . piperacillin-tazobactam (ZOSYN)  IV  3.375 g Intravenous Q8H  . potassium chloride  40 mEq Oral BID  . predniSONE  7 mg Oral Q breakfast  . sodium chloride flush  3 mL Intravenous Q12H  . tamsulosin  0.4 mg Oral QHS   Continuous Infusions: . dextrose 50 mL/hr at 10/06/15 0947     LOS: 4 days    Time spent: 30 minutes.     Hosie Poisson, MD [facility] Pager [phone]   If 7PM-7AM, please contact night-coverage [email or web address] Password [facility] 10/06/2015, 2:03 PM

## 2015-10-06 NOTE — Clinical Social Work Note (Signed)
CSW spoke via phone with patient's wife- Jacob [name] this afternoon. She stated that she is very upset about her husband's condition and wants "consistent" information about his condition. Patient has resided at [facility] but she does not wish him to return there at d/c.  Prior to this- he was recently at [facility] per wife who indicates that he has exhausted his insurance days at this SNF and she cannot afford private pay status.  Discuss options for patient at d/c- she stated that she has discussed desire for inpatient rehab (CIR) with MD and wishes this option for her husband. SW discussed, in detail, the requirements for patient to be admitted to [facility] and he is currently requiring a hoyer lift from bed to chair. Thus, it is very doubtful that he would qualify for CIR and she need to consider other d/c options.  Wife indicated that she believes that her daughter Hilda Blades is going to start a Medicaid application but she is unsure and they both need further information/assistance. She would not allow this SW to call her daughter today to discuss further but stated she would talk to Hilda Blades this evening. She will need to consider either seeking a Medicaid SNF bed or taking patient home. She is also interested in the idea and states she has all DME at home for patient but does not feel she can manage his needs at home in this current condition.  She is hopeful that he will improve.  PT is recommending continued SNF level of care.  Fl2 initiated and placed on chart for MD's signature. SW services to work with wife and daughter to determine decision re: d/c options.    Lorie Phenix. Pauline Good, [place] (weekend coverage)

## 2015-10-06 NOTE — Progress Notes (Signed)
Primary cardiologist: Dr. Landry Corporal  Seen for followup: Atrial fibrillation  Subjective:    Patient still confused, does not endorse any chest pain or palpitations.  Objective:   Temp:  [97.5 F (36.4 C)-98.8 F (37.1 C)] 97.5 F (36.4 C) (07/23 0750) Pulse Rate:  [75-96] 79 (07/23 0750) Resp:  [13-31] 23 (07/23 0750) BP: (91-173)/(56-100) 165/72 (07/23 0750) SpO2:  [97 %-100 %] 99 % (07/23 0750) Weight:  [193 lb 5.5 oz (87.7 kg)] 193 lb 5.5 oz (87.7 kg) (07/23 0449) Last BM Date: 10/05/15  Filed Weights   10/03/15 0522 10/04/15 0500 10/06/15 0449  Weight: 195 lb 5.2 oz (88.6 kg) 200 lb 6.4 oz (90.9 kg) 193 lb 5.5 oz (87.7 kg)    Intake/Output Summary (Last 24 hours) at 10/06/15 1041 Last data filed at 10/06/15 0949  Gross per 24 hour  Intake              203 ml  Output              675 ml  Net             -472 ml    Telemetry: Rate-controlled atrial fibrillation.  Exam:  General: Elderly male in no distress.  Lungs: Clear, nonlabored.  Cardiac: Irregularly irregular. No gallop.  Extremities: Trace ankle edema.  Lab Results:  Basic Metabolic Panel:  Recent Labs Lab 10/01/15 2145 10/02/15 0600 10/03/15 0725 10/04/15 0101 10/05/15 1520 10/06/15 0603  NA 149* 147* 144 147* 146* 147*  K 2.9* 3.6 3.3* 4.1 3.6 3.1*  CL 114* 114* 113* 118* 116* 115*  CO2 26 22 23  21* 24 24  GLUCOSE 159* 255* 269* 174* 157* 76  BUN 32* 33* 38* 32* 18 16  CREATININE 2.33* 2.28* 2.36* 2.26* 1.85* 1.85*  CALCIUM 9.2 8.2* 8.4* 8.6* 8.9 8.7*  MG 1.7 1.6* 1.8  --   --   --     Liver Function Tests:  Recent Labs Lab 10/01/15 2145 10/02/15 0600 10/04/15 0101  AST 14* 12* 13*  ALT 14* 13* 14*  ALKPHOS 117 99 98  BILITOT 1.7* 1.4* 1.1  PROT 5.3* 4.5* 5.1*  ALBUMIN 2.5* 2.1* 2.3*    CBC:  Recent Labs Lab 10/02/15 0600 10/04/15 0101 10/05/15 1520  WBC 1.8* 1.9* 3.2*  HGB 9.1* 10.1* 10.0*  HCT 28.1* 31.9* 32.0*  MCV 91.8 91.7 91.4  PLT 210 257 261     Cardiac Enzymes:  Recent Labs Lab 10/03/15 1022 10/03/15 1925 10/04/15 0101  TROPONINI 0.64* 0.57* 0.51*    Echocardiogram 08/29/2015: Study Conclusions  - Left ventricle: The cavity size was mildly dilated. There was  mild concentric hypertrophy. Systolic function was normal. The  estimated ejection fraction was in the range of 55% to 60%.  Doppler parameters are consistent with abnormal left ventricular  relaxation (grade 1 diastolic dysfunction). - Aortic valve: Transvalvular velocity was within the normal range.  There was no stenosis. There was no regurgitation. - Mitral valve: Transvalvular velocity was within the normal range.  There was no evidence for stenosis. There was no regurgitation. - Right ventricle: The cavity size was normal. Wall thickness was  normal. Systolic function was normal. - Tricuspid valve: There was trivial regurgitation. - Pulmonary arteries: Systolic pressure was within the normal  range. PA peak pressure: 23 mm Hg (S).   Medications:   Scheduled Medications: . amiodarone  200 mg Oral BID  . antiseptic oral rinse  7 mL Mouth Rinse q12n4p  .  chlorhexidine  15 mL Mouth Rinse BID  . Chlorhexidine Gluconate Cloth  6 each Topical Q0600  . cycloSPORINE  1 drop Both Eyes BID  . finasteride  5 mg Oral QHS  . heparin  5,000 Units Subcutaneous Q8H  . insulin aspart  0-15 Units Subcutaneous TID WC  . metoprolol tartrate  25 mg Oral TID  . mometasone-formoterol  2 puff Inhalation BID  . mupirocin ointment  1 application Nasal BID  . pilocarpine  1 drop Both Eyes TID  . piperacillin-tazobactam (ZOSYN)  IV  3.375 g Intravenous Q8H  . potassium chloride  40 mEq Oral BID  . predniSONE  7 mg Oral Q breakfast  . sodium chloride flush  3 mL Intravenous Q12H  . tamsulosin  0.4 mg Oral QHS    PRN Medications: acetaminophen **OR** acetaminophen, albuterol, fentaNYL (SUBLIMAZE) injection, ipratropium, ondansetron **OR** ondansetron (ZOFRAN)  IV   Assessment:   1. Persistent atrial fibrillation, currently rate controlled on combination of oral amiodarone and metoprolol. CHADSVASC score is 5. Anticoagulation recommended per Dr. Wynonia Lawman once patient stable from the perspective of other active comorbidities.  2. Demand ischemia in the setting of known CAD and acute illness. No chest pain.   Plan/Discussion:    No change made to current regimen, cpntinue amiodarone and metoprolol. As outlined above, consider oral anticoagulation ultimately.   Jacob [name], M.D., F.A.C.C.

## 2015-10-06 NOTE — Progress Notes (Signed)
Hypoglycemic Event  CBG: 61  Treatment: dextrose 50% 34ml  Symptoms: none  Follow-up CBG: Time:0440 CBG Result: 109  Possible Reasons for Event: Unknown Comments/MD notified:    Sharan Mcenaney, Blondell Reveal

## 2015-10-06 NOTE — NC FL2 (Signed)
[place] LEVEL OF CARE SCREENING TOOL     IDENTIFICATION  Patient Name: Jacob [name] Birthdate: 1940-05-28 Sex: male Admission Date (Current Location): 10/01/2015  [facility] and [place] Number:  Herbalist and Address:  [facility]. [facility], [place] [address] [place], [place] [address]      Provider Number: [id number]  Attending Physician Name and Address:  Hosie Poisson, MD  Relative Name and Phone Number:  Ronnald, Bido [phone] or [phone] or Dove,Debra Daughter [phone]    Current Level of Care: Hospital Recommended Level of Care: [place] Prior Approval Number:    Date Approved/Denied:   PASRR Number: [id number]  Discharge Plan: SNF    Current Diagnoses: Patient Active Problem List   Diagnosis Date Noted  . Abscess   . AKI (acute kidney injury) ([place])   . Sepsis ([place]) 10/02/2015  . Acute kidney injury superimposed on chronic kidney disease ([place]) 10/02/2015  . Atrial fibrillation with RVR ([place]) 10/02/2015  . Hypotension 10/02/2015  . Anorexia   . Adjustment disorder with mixed emotional features 09/01/2015  . Acute encephalopathy 08/29/2015  . Elevated troponin 08/29/2015  . Renal cancer ([place]) 08/28/2015  . Spinal stenosis of lumbar region with radiculopathy 08/07/2015  . History of COPD   . OSA (obstructive sleep apnea) 07/27/2015  . HNP (herniated nucleus pulposus), lumbar 07/24/2015  . CHF with left ventricular diastolic dysfunction, NYHA class 2 ([place]) 01/28/2014  . PMR (polymyalgia rheumatica) ([place]) 09/09/2013  . Rheumatoid arthritis ([place]) 04/13/2012  . CAD (coronary artery disease) 12/19/2011  . Diabetes mellitus, type 2 ([place]) 12/19/2011  . Neuropathy ([place]) 12/19/2011    Orientation RESPIRATION BLADDER Height & Weight     Self  Normal Incontinent Weight: 193 lb 5.5 oz (87.7 kg) Height:  5\' 11"  (180.3 cm)  BEHAVIORAL SYMPTOMS/MOOD NEUROLOGICAL BOWEL NUTRITION STATUS       Incontinent Diet (Carb Modified)  AMBULATORY STATUS COMMUNICATION OF NEEDS Skin   Extensive Assist Verbally Other (Comment) (Moisture associated areas)                       Personal Care Assistance Level of Assistance  Bathing, Dressing Bathing Assistance: Limited assistance   Dressing Assistance: Limited assistance     Functional Limitations Info    Sight Info: Adequate        SPECIAL CARE FACTORS FREQUENCY  PT (By licensed PT), OT (By licensed OT)     PT Frequency: 5 OT Frequency: 5            Contractures Contractures Info: Not present    Additional Factors Info  Code Status, Allergies (Partial Code) Code Status Info: Partial Code Allergies Info: Ace Inhibiters, Actonel (Risedronate),Ciprocinonide, Flunsolide, Metformin and related, sertraline, Sulindac,Terazosin           Current Medications (10/06/2015):  This is the current hospital active medication list Current Facility-Administered Medications  Medication Dose Route Frequency Provider Last Rate Last Dose  . acetaminophen (TYLENOL) tablet 650 mg  650 mg Oral Q6H PRN Norval Morton, MD   650 mg at 10/05/15 1727   Or  . acetaminophen (TYLENOL) suppository 650 mg  650 mg Rectal Q6H PRN Rondell A Tamala Julian, MD      . albuterol (PROVENTIL) (2.5 MG/3ML) 0.083% nebulizer solution 2.5 mg  2.5 mg Nebulization Q2H PRN Rondell A Tamala Julian, MD      . amiodarone (PACERONE) tablet 200 mg  200 mg Oral BID Hosie Poisson, MD  200 mg at 10/06/15 2000  . antiseptic oral rinse (CPC / CETYLPYRIDINIUM CHLORIDE 0.05%) solution 7 mL  7 mL Mouth Rinse q12n4p Hosie Poisson, MD   7 mL at 10/06/15 1646  . chlorhexidine (PERIDEX) 0.12 % solution 15 mL  15 mL Mouth Rinse BID Hosie Poisson, MD   15 mL at 10/06/15 2144  . Chlorhexidine Gluconate Cloth 2 % PADS 6 each  6 each Topical Q0600 Hosie Poisson, MD   6 each at 10/06/15 0600  . cycloSPORINE (RESTASIS) 0.05 % ophthalmic emulsion 1 drop  1 drop Both Eyes BID Norval Morton, MD   1 drop at  10/06/15 2144  . dextrose 5 % solution   Intravenous Continuous Hosie Poisson, MD 50 mL/hr at 10/06/15 0947    . fentaNYL (SUBLIMAZE) injection 12.5-25 mcg  12.5-25 mcg Intravenous Q2H PRN Norval Morton, MD   25 mcg at 10/06/15 2144  . finasteride (PROSCAR) tablet 5 mg  5 mg Oral QHS Norval Morton, MD   5 mg at 10/06/15 2000  . heparin injection 5,000 Units  5,000 Units Subcutaneous Q8H Norval Morton, MD   5,000 Units at 10/06/15 2144  . insulin aspart (novoLOG) injection 0-15 Units  0-15 Units Subcutaneous TID WC Hosie Poisson, MD   2 Units at 10/05/15 1728  . ipratropium (ATROVENT) nebulizer solution 0.5 mg  0.5 mg Nebulization Q2H PRN Norval Morton, MD      . metoprolol tartrate (LOPRESSOR) tablet 25 mg  25 mg Oral TID Jacolyn Reedy, MD   25 mg at 10/06/15 2000  . mometasone-formoterol (DULERA) 200-5 MCG/ACT inhaler 2 puff  2 puff Inhalation BID Hosie Poisson, MD   2 puff at 10/04/15 1947  . ondansetron (ZOFRAN) tablet 4 mg  4 mg Oral Q6H PRN Norval Morton, MD       Or  . ondansetron (ZOFRAN) injection 4 mg  4 mg Intravenous Q6H PRN Norval Morton, MD   4 mg at 10/06/15 1345  . pilocarpine (PILOCAR) 2 % ophthalmic solution 1 drop  1 drop Both Eyes TID Norval Morton, MD   1 drop at 10/06/15 2144  . piperacillin-tazobactam (ZOSYN) IVPB 3.375 g  3.375 g Intravenous Q8H Romona Curls, RPH   3.375 g at 10/06/15 2144  . predniSONE (DELTASONE) tablet 7 mg  7 mg Oral Q breakfast Norval Morton, MD   7 mg at 10/06/15 0815  . sodium chloride flush (NS) 0.9 % injection 3 mL  3 mL Intravenous Q12H Norval Morton, MD   3 mL at 10/06/15 0949  . tamsulosin (FLOMAX) capsule 0.4 mg  0.4 mg Oral QHS Norval Morton, MD   0.4 mg at 10/06/15 2000     Discharge Medications: Please see discharge summary for a list of discharge medications.  Relevant Imaging Results:  Relevant Lab Results:   Additional Information SSN [place], Lorie Phenix, [place]

## 2015-10-06 NOTE — NC FL2 (Deleted)
[place] LEVEL OF CARE SCREENING TOOL     IDENTIFICATION  Patient Name: Jacob [name] Birthdate: Jan 13, 1941 Sex: male Admission Date (Current Location): 10/01/2015  [facility] and [place] Number:  Herbalist and Address:  [facility] [place]. [facility], [place] [address], [place], [place] [address]      Provider Number: [id number]  Attending Physician Name and Address:  Hosie Poisson, MD  Relative Name and Phone Number:  Marcquise, [place] [phone] or [phone] or Dove,Debra Daughter [phone]    Current Level of Care: Hospital Recommended Level of Care: [place] Prior Approval Number:    Date Approved/Denied:   PASRR Number:    Discharge Plan: SNF    Current Diagnoses: Patient Active Problem List   Diagnosis Date Noted  . Abscess   . AKI (acute kidney injury) ([place])   . Sepsis ([place]) 10/02/2015  . Acute kidney injury superimposed on chronic kidney disease ([place]) 10/02/2015  . Atrial fibrillation with RVR ([place]) 10/02/2015  . Hypotension 10/02/2015  . Anorexia   . Adjustment disorder with mixed emotional features 09/01/2015  . Acute encephalopathy 08/29/2015  . Elevated troponin 08/29/2015  . Renal cancer ([place]) 08/28/2015  . Spinal stenosis of lumbar region with radiculopathy 08/07/2015  . History of COPD   . OSA (obstructive sleep apnea) 07/27/2015  . HNP (herniated nucleus pulposus), lumbar 07/24/2015  . CHF with left ventricular diastolic dysfunction, NYHA class 2 ([place]) 01/28/2014  . PMR (polymyalgia rheumatica) ([place]) 09/09/2013  . Rheumatoid arthritis ([place]) 04/13/2012  . CAD (coronary artery disease) 12/19/2011  . Diabetes mellitus, type 2 ([place]) 12/19/2011  . Neuropathy ([place]) 12/19/2011    Orientation RESPIRATION BLADDER Height & Weight     Self      Weight: 193 lb 5.5 oz (87.7 kg) Height:  5\' 11"  (180.3 cm)  BEHAVIORAL SYMPTOMS/MOOD NEUROLOGICAL BOWEL NUTRITION STATUS           AMBULATORY STATUS  COMMUNICATION OF NEEDS Skin   Extensive Assist                           Personal Care Assistance Level of Assistance  Bathing, Dressing Bathing Assistance: Maximum assistance   Dressing Assistance: Maximum assistance     Functional Limitations Info             SPECIAL CARE FACTORS FREQUENCY                       Contractures      Additional Factors Info                  Current Medications (10/06/2015):  This is the current hospital active medication list Current Facility-Administered Medications  Medication Dose Route Frequency Provider Last Rate Last Dose  . acetaminophen (TYLENOL) tablet 650 mg  650 mg Oral Q6H PRN Norval Morton, MD   650 mg at 10/05/15 1727   Or  . acetaminophen (TYLENOL) suppository 650 mg  650 mg Rectal Q6H PRN Norval Morton, MD      . albuterol (PROVENTIL) (2.5 MG/3ML) 0.083% nebulizer solution 2.5 mg  2.5 mg Nebulization Q2H PRN Norval Morton, MD      . amiodarone (PACERONE) tablet 200 mg  200 mg Oral BID Hosie Poisson, MD   200 mg at 10/06/15 2000  . antiseptic oral rinse (CPC / CETYLPYRIDINIUM CHLORIDE 0.05%) solution 7 mL  7 mL Mouth Rinse q12n4p Hosie Poisson,  MD   7 mL at 10/06/15 1646  . chlorhexidine (PERIDEX) 0.12 % solution 15 mL  15 mL Mouth Rinse BID Hosie Poisson, MD   15 mL at 10/06/15 0946  . Chlorhexidine Gluconate Cloth 2 % PADS 6 each  6 each Topical Q0600 Hosie Poisson, MD   6 each at 10/06/15 0600  . cycloSPORINE (RESTASIS) 0.05 % ophthalmic emulsion 1 drop  1 drop Both Eyes BID Norval Morton, MD   1 drop at 10/06/15 0947  . dextrose 5 % solution   Intravenous Continuous Hosie Poisson, MD 50 mL/hr at 10/06/15 0947    . fentaNYL (SUBLIMAZE) injection 12.5-25 mcg  12.5-25 mcg Intravenous Q2H PRN Norval Morton, MD   25 mcg at 10/06/15 0948  . finasteride (PROSCAR) tablet 5 mg  5 mg Oral QHS Norval Morton, MD   5 mg at 10/06/15 2000  . heparin injection 5,000 Units  5,000 Units Subcutaneous Q8H Norval Morton,  MD   5,000 Units at 10/06/15 1345  . insulin aspart (novoLOG) injection 0-15 Units  0-15 Units Subcutaneous TID WC Hosie Poisson, MD   2 Units at 10/05/15 1728  . ipratropium (ATROVENT) nebulizer solution 0.5 mg  0.5 mg Nebulization Q2H PRN Norval Morton, MD      . metoprolol tartrate (LOPRESSOR) tablet 25 mg  25 mg Oral TID Jacolyn Reedy, MD   25 mg at 10/06/15 2000  . mometasone-formoterol (DULERA) 200-5 MCG/ACT inhaler 2 puff  2 puff Inhalation BID Hosie Poisson, MD   2 puff at 10/04/15 1947  . mupirocin ointment (BACTROBAN) 2 % 1 application  1 application Nasal BID Hosie Poisson, MD   1 application at [id number]  . ondansetron (ZOFRAN) tablet 4 mg  4 mg Oral Q6H PRN Norval Morton, MD       Or  . ondansetron (ZOFRAN) injection 4 mg  4 mg Intravenous Q6H PRN Norval Morton, MD   4 mg at 10/06/15 1345  . pilocarpine (PILOCAR) 2 % ophthalmic solution 1 drop  1 drop Both Eyes TID Norval Morton, MD   1 drop at 10/06/15 1646  . piperacillin-tazobactam (ZOSYN) IVPB 3.375 g  3.375 g Intravenous Q8H Romona Curls, RPH   3.375 g at 10/06/15 1345  . predniSONE (DELTASONE) tablet 7 mg  7 mg Oral Q breakfast Norval Morton, MD   7 mg at 10/06/15 0815  . sodium chloride flush (NS) 0.9 % injection 3 mL  3 mL Intravenous Q12H Norval Morton, MD   3 mL at 10/06/15 0949  . tamsulosin (FLOMAX) capsule 0.4 mg  0.4 mg Oral QHS Norval Morton, MD   0.4 mg at 10/06/15 2000     Discharge Medications: Please see discharge summary for a list of discharge medications.  Relevant Imaging Results:  Relevant Lab Results:   Additional Information    Williemae Area, [place]

## 2015-10-07 ENCOUNTER — Inpatient Hospital Stay (HOSPITAL_COMMUNITY): Payer: PPO

## 2015-10-07 ENCOUNTER — Other Ambulatory Visit: Payer: Self-pay | Admitting: Physician Assistant

## 2015-10-07 ENCOUNTER — Telehealth: Payer: Self-pay | Admitting: Neurology

## 2015-10-07 LAB — BASIC METABOLIC PANEL
Anion gap: 8 (ref 5–15)
BUN: 13 mg/dL (ref 6–20)
CO2: 26 mmol/L (ref 22–32)
Calcium: 8.3 mg/dL — ABNORMAL LOW (ref 8.9–10.3)
Chloride: 110 mmol/L (ref 101–111)
Creatinine, Ser: 1.85 mg/dL — ABNORMAL HIGH (ref 0.61–1.24)
GFR calc Af Amer: 39 mL/min — ABNORMAL LOW (ref >60)
GFR calc non Af Amer: 34 mL/min — ABNORMAL LOW (ref >60)
Glucose, Bld: 121 mg/dL — ABNORMAL HIGH (ref 65–99)
Potassium: 3 mmol/L — ABNORMAL LOW (ref 3.5–5.1)
Sodium: 144 mmol/L (ref 135–145)

## 2015-10-07 LAB — CBC
HCT: 28.2 % — ABNORMAL LOW (ref 39.0–52.0)
Hemoglobin: 9 g/dL — ABNORMAL LOW (ref 13.0–17.0)
MCH: 28.8 pg (ref 26.0–34.0)
MCHC: 31.9 g/dL (ref 30.0–36.0)
MCV: 90.1 fL (ref 78.0–100.0)
Platelets: 259 10*3/uL (ref 150–400)
RBC: 3.13 MIL/uL — ABNORMAL LOW (ref 4.22–5.81)
RDW: 16.2 % — ABNORMAL HIGH (ref 11.5–15.5)
WBC: 5 10*3/uL (ref 4.0–10.5)

## 2015-10-07 LAB — GLUCOSE, CAPILLARY
Glucose-Capillary: 111 mg/dL — ABNORMAL HIGH (ref 65–99)
Glucose-Capillary: 108 mg/dL — ABNORMAL HIGH (ref 65–99)
Glucose-Capillary: 121 mg/dL — ABNORMAL HIGH (ref 65–99)
Glucose-Capillary: 149 mg/dL — ABNORMAL HIGH (ref 65–99)
Glucose-Capillary: 151 mg/dL — ABNORMAL HIGH (ref 65–99)
Glucose-Capillary: 154 mg/dL — ABNORMAL HIGH (ref 65–99)
Glucose-Capillary: 161 mg/dL — ABNORMAL HIGH (ref 65–99)

## 2015-10-07 LAB — CULTURE, BLOOD (ROUTINE X 2)
Culture: NO GROWTH
Culture: NO GROWTH

## 2015-10-07 LAB — MAGNESIUM: Magnesium: 1.4 mg/dL — ABNORMAL LOW (ref 1.7–2.4)

## 2015-10-07 MED ORDER — POTASSIUM CHLORIDE CRYS ER 20 MEQ PO TBCR
40.0000 meq | EXTENDED_RELEASE_TABLET | Freq: Two times a day (BID) | ORAL | Status: AC
  Administered 2015-10-07: 40 meq via ORAL
  Filled 2015-10-07 (×2): qty 2

## 2015-10-07 NOTE — Procedures (Signed)
ELECTROENCEPHALOGRAM REPORT  Date of Study: 10/07/2015  Patient's Name: Jacob [name] MRN: [name] Date of Birth: 11-14-1940  Referring Provider: Dr. Hosie Poisson  Clinical History: This is a 75 year old man with altered mental status.  Medications: acetaminophen (TYLENOL) tablet 650 mg  albuterol (PROVENTIL) (2.5 MG/3ML) 0.083% nebulizer solution 2.5 mg  amiodarone (PACERONE) tablet 200 mg  finasteride (PROSCAR) tablet 5 mg  metoprolol tartrate (LOPRESSOR) tablet 25 mg  predniSONE (DELTASONE) tablet 7 mg   Technical Summary: A multichannel digital EEG recording measured by the international 10-20 system with electrodes applied with paste and impedances below 5000 ohms performed as portable with EKG monitoring in an awake patient.  Hyperventilation and photic stimulation were not performed.  The digital EEG was referentially recorded, reformatted, and digitally filtered in a variety of bipolar and referential montages for optimal display.   Description: The patient is awake during the recording.  During maximal wakefulness, there is a symmetric, medium voltage 7 Hz posterior dominant rhythm that poorly attenuates to eye opening and eye closure. This is admixed with a moderate amount of diffuse 5-6 Hz theta slowing of the waking background. Sleep architecture is not seen. Hyperventilation and photic stimulation were not performed. There is muscle artifact obscuring EEG, however in between artifact, there were no epileptiform discharges or electrographic seizures seen.    EKG lead showed occasional extrasystolic beats.  Impression: This awake EEG is abnormal due to mild diffuse slowing of the waking background.  Clinical Correlation: This study is limited due to muscle artifact obscuring EEG, however in between artifact,there were no epileptiform discharges seen. The above findings indicate diffuse cerebral dysfunction that is non-specific in etiology and can be seen with  hypoxic/ischemic injury, toxic/metabolic encephalopathies, neurodegenerative disorders, or medication effect.  The absence of epileptiform discharges does not rule out a clinical diagnosis of epilepsy.  Clinical correlation is advised.   Ellouise Newer, M.D.

## 2015-10-07 NOTE — Progress Notes (Signed)
Jacob [name] for Infectious Disease   Reason for visit: Follow up on fever  Interval History: has not had a fever since initial fever 7/18.  Aspiration of fluid collection without pus or organisms, culture negative.  WBC up to 5.0    Physical Exam: Constitutional:  Vitals:   10/07/15 0426 10/07/15 0901  BP: (!) 159/88 (!) 160/85  Pulse: 92 83  Resp: (!) 32 (!) 32  Temp: 97.7 F (36.5 C) 98.1 F (36.7 C)   patient appears in nad Respiratory: Normal respiratory effort; CTA B Cardiovascular: RRR GI: soft ,nt, nd Neuro:confused  Review of Systems: Unable to be assessed due to mental status  Lab Results  Component Value Date   WBC 5.0 10/07/2015   HGB 9.0 (L) 10/07/2015   HCT 28.2 (L) 10/07/2015   MCV 90.1 10/07/2015   PLT 259 10/07/2015    Lab Results  Component Value Date   CREATININE 1.85 (H) 10/07/2015   BUN 13 10/07/2015   NA 144 10/07/2015   K 3.0 (L) 10/07/2015   CL 110 10/07/2015   CO2 26 10/07/2015    Lab Results  Component Value Date   ALT 14 (L) 10/04/2015   AST 13 (L) 10/04/2015   ALKPHOS 98 10/04/2015     Microbiology: Recent Results (from the past 240 hour(s))  Culture, blood (Routine X 2) w Reflex to ID Panel     Status: None (Preliminary result)   Collection Time: 10/01/15 11:40 PM  Result Value Ref Range Status   Specimen Description BLOOD LEFT ARM  Final   Special Requests   Final    BOTTLES DRAWN AEROBIC AND ANAEROBIC 10CC PT ON VANC ZOSYN   Culture NO GROWTH 4 DAYS  Final   Report Status PENDING  Incomplete  Culture, blood (Routine X 2) w Reflex to ID Panel     Status: None (Preliminary result)   Collection Time: 10/01/15 11:45 PM  Result Value Ref Range Status   Specimen Description BLOOD LEFT HAND  Final   Special Requests IN PEDIATRIC BOTTLE 4CC  Final   Culture NO GROWTH 4 DAYS  Final   Report Status PENDING  Incomplete  Urine culture     Status: Abnormal   Collection Time: 10/01/15 11:57 PM  Result Value Ref Range Status     Specimen Description URINE, RANDOM  Final   Special Requests NONE  Final   Culture >=100,000 COLONIES/mL YEAST (A)  Final   Report Status 10/03/2015 FINAL  Final  MRSA PCR Screening     Status: Abnormal   Collection Time: 10/02/15  9:40 AM  Result Value Ref Range Status   MRSA by PCR POSITIVE (A) NEGATIVE Final    Comment:        The GeneXpert MRSA Assay (FDA approved for NASAL specimens only), is one component of a comprehensive MRSA colonization surveillance program. It is not intended to diagnose MRSA infection nor to guide or monitor treatment for MRSA infections. RESULT CALLED TO, READ BACK BY AND VERIFIED WITH: Bernarda Caffey RN 11:45 10/02/15 ([email or web address])   Aerobic/Anaerobic Culture (surgical/deep wound)     Status: None (Preliminary result)   Collection Time: 10/03/15  5:00 PM  Result Value Ref Range Status   Specimen Description ABSCESS BACK  Final   Special Requests NONE  Final   Gram Stain   Final    FEW WBC PRESENT,BOTH PMN AND MONONUCLEAR NO ORGANISMS SEEN    Culture   Final    NO GROWTH 2 DAYS  NO ANAEROBES ISOLATED; CULTURE IN PROGRESS FOR 5 DAYS   Report Status PENDING  Incomplete    Impression/Plan:  1. Fever - remains afebrile now.  Fluid in back does not appear infected on aspiration, cultures remain negative to date. I will stop zosyn.  2. Confusion - intermittent.  Unknown etiology.  Does not appear to be consistent with #1.   3. Neutropenia - recent and had been on vancomycin and ceftriaxone, both of which can cause this.  Now WBC wnl confirming likely secondary to vancomycin or ceftriaxone.  If restarted in the future, will need close monitoring.  4. Discitis - he was being treated for discitis but repeat MRI does not suggest it was discitis compared to initial MRI.  I do not feel this needs further treatment.   I will sign off, please call with questions. thanks

## 2015-10-07 NOTE — Progress Notes (Signed)
Speech Language Pathology Treatment: Dysphagia  Patient Details Name: Jacob [name] MRN: [name] DOB: 1940-05-18 Today's Date: 10/07/2015 Time: [id number] SLP Time Calculation (min) (ACUTE ONLY): 12 min  Assessment / Plan / Recommendation Clinical Impression  Pt with persisting confusion, disorientation, but more alert today.  Attempted to assist with breakfast; pt consumed limited bites of pancakes, then refused due to being "sick on my stomach." He is drinking liquids without s/s of aspiration.  Has limited appetite overall and poor initiation/attention due to MS.  Recommend downgrading diet to dysphagia 3; continue thin liquids and meds whole in puree.  There is little else SLP can offer - when he is awake, he appears to be protecting his airway.  Our services will sign off.    HPI HPI: 75 y.o. male with medical history significant of CAD, CABG, IDDM, COPD, dyslipidemia, RCC, PMR, HTN, CKD, OSA , lumbar stenosis s/p correction, and recently was hospitalized with discitis , was on rocephin and vancomycin till 7/13, presents for altered mental status.  Dx sepsis, acute kidney injury.        SLP Plan  Discharge SLP treatment due to (comment) - minimal progress due to mentation.    Recommendations  Diet recommendations: Dysphagia 3 (mechanical soft);Thin liquid Liquids provided via: Cup;Straw Medication Administration: Whole meds with puree Supervision: Staff to assist with self feeding Compensations: Minimize environmental distractions Postural Changes and/or Swallow Maneuvers: Seated upright 90 degrees             Oral Care Recommendations: Oral care BID Follow up Recommendations: None Plan: Discharge SLP treatment due to (comment)     GO               Jacob [name] L. Tivis Ringer, [place] CCC/SLP Pager [phone]  Jacob [name] 10/07/2015, 9:53 AM

## 2015-10-07 NOTE — Progress Notes (Signed)
Report received from Maudie Mercury, [place] for transfer to [phone]

## 2015-10-07 NOTE — Telephone Encounter (Signed)
FYI

## 2015-10-07 NOTE — Progress Notes (Signed)
NURSING PROGRESS NOTE  CHELSEA WICE [name] Transfer Data: 10/07/2015 5:04 PM Attending Provider: Hosie Poisson, MD PCP:No primary care provider on file. Code Status:Partial Code  Jacob [name] is a 75 y.o. male patient transferred from [facility] -No acute distress noted.  -No complaints of shortness of breath.  -No complaints of chest pain.   Patient alert and oriented x2.   Cardiac Monitoring: Box # 2 in place.   Allergies:  Ace inhibitors; Actonel [risedronate]; Ciprocinonide [fluocinolone]; Flunisolide; Metformin and related; Sertraline; Sulindac; and Terazosin  Past Medical History:   has a past medical history of Allergy; Anemia; Anxiety; Arthritis; Asthma; BPH (benign prostatic hyperplasia); Cancer of kidney ([place]); Cataract; Chronic kidney disease; COPD (chronic obstructive pulmonary disease) ([place]); Coronary artery disease; Diabetes mellitus without complication ([place]); Hepatitis; Hypertension; Polymyalgia rheumatica ([place]); Shortness of breath dyspnea; and Sleep apnea.  Past Surgical History:   has a past surgical history that includes Appendectomy; Hernia repair; ostate surgery; Coronary artery bypass graft (03/17/1995); Cataract extraction w/PHACO (Right, 11/28/2014); Lumbar laminectomy/decompression microdiscectomy (N/A, 07/30/2015); and Lumbar laminectomy/decompression microdiscectomy (N/A, 09/06/2015).  Social History:   reports that he quit smoking about 58 years ago. He has never used smokeless tobacco. He reports that he does not drink alcohol or use drugs.  Skin: MASD to groin. Bruising.   Patient/Family orientated to room. Information packet given to patient/family. Admission inpatient armband information verified with patient/family to include name and date of birth and placed on patient arm. Side rails up x 2, fall assessment and education completed with patient/family. Patient/family able to verbalize understanding of risk associated with falls and verbalized understanding to  call for assistance before getting out of bed. Call light within reach. Patient/family able to voice and demonstrate understanding of unit orientation instructions.    Will continue to evaluate and treat per MD orders.

## 2015-10-07 NOTE — Progress Notes (Addendum)
Jacob [name]  [id number] DOB: 1940/12/03 DOA: 10/01/2015 PCP: No primary care provider on file.    Brief Narrative: PHILANDER SHUCK is a 75 y.o. male with medical history significant of CAD, CABG, IDDM, COPD, dyslipidemia, RCC, PMR, HTN, CKD, OSA , lumbar stenosis s/p correction, and recently was hospitalized with discitis,  was on rocephin and vancomycin till 7/13, presents for altered mental status. On arrival to ED, he was found to be febrile, tachycardic, hypotensive, in a fib with RVR, . He was admitted to step down for further management.  Cardiology and PCCM consulted.  PCCM signed off recommending a repeat MRI of the lumbar spine. MRI spine showed fluid collection , suspect abscess, NS and IR consulted for further eval.  CT guided aspiration of the fluid collection done on 7/20 and cultures are pending. No organisms seen.   Assessment & Plan:   Principal Problem:   Sepsis ([place]) Active Problems:   Diabetes mellitus, type 2 ([place])   CHF with left ventricular diastolic dysfunction, NYHA class 2 (HCC)   Acute encephalopathy   Elevated troponin   Acute kidney injury superimposed on chronic kidney disease (HCC)   Atrial fibrillation with RVR (HCC)   Hypotension   Abscess   AKI (acute kidney injury) ([place])  Sepsis of unclear source possibly from health care associated pneumonia vs recurrent discitis.  CXR shows left lobe air space consolidation.  MRI of the lumbar spine ordered to further evaluate the discitis.  It showed 1.4 x 2.5 x 3.3 cm rim enhancing paraspinal fluid collection concerning for abscess. 7 x 15 mm fluid collection in RIGHT laminectomy defect equivocal for abscess, and could represent seroma or possibly hematoma. IR consulted and he underwent CT guided aspiration of the fluid collection , the aspirate is negative for organisms, few wbc seen and cultures have been negative so far.  He just completed 4 weeks of vancomycin and rocephin on 7/13.  He  was started on IV vancomycin and IV zosyn, IV azithromycin on admission , later transitioned to zosyn alone, ID has discontinued the vancomycin and azithromycin. Zosyn discontinued on 7/24. Recommend watching him off antibiotics for 24 more hours and work with PT.  Lactic acid is normalized, trend pro calcitonin.  Blood cultures negative so far.  Requested neuro surgery consult in view of the abnormal MRI, suggested probably not an abscess as the cultures have been negative.  He is afebrile and repeat labs to check WBC count, show normal wbc count.  Repeat CXR shows some CHF,and some mild interstitial edema bilaterally.   Atrial fibrillation with RVR: Rate is better controlled after starting amiodarone, transitioned to po amiodarone. Serial troponins are elevated and peaked at 0.9  and Dr Wynonia Lawman on board for further recommendations.  Dig  And beta blocker added by cardiology. Awaiting cardiology recommendations regarding anticoagulation. No changes in medications.    Acute kidney injury; Unclear etiology, sepsis, dehydration.  UA shows SMALL LEUKOCYTOSIS with few bacteria and wbc.  Urine cultures done , show yeast, one dose of diflucan ordered.  US renal ordered and currently on IV fluids. US renal does not show any hydronephrosis.   Pt is incontinent and urine output couldn't be measure appropriately.  Monitor renal parameters. Repeat labs show improvement.  Baseline creatinine between 1.2 to 1.4, currently it has improved to 1.8 from 2.8.    IDDM: SLP  EVAL recommended restarting  Dysphagia 3 diet.  CBG (last 3)   Recent Labs  10/07/15  0424 10/07/15 0900 10/07/15 1141  GLUCAP 108* 121* 149*     Nutrition: Regular diet with supplementations.   Hypernatremia: Probably from dehydration and Poor po intake.  Resolved after fluid hydration.    Leukopenia,  Unclear etiology. Probably from IV antibiotics.  Resolved.  Anemia: Baseline hemoglobin between 9 to 10,   Stable. Monitor.   COPD: No wheezing heard, resume Symbicort and duo nebs as needed.   PMR:  On  Maintenance dose of prednisone, continue the same.      Hypokalemia: replete as needed. Get magnesium levels.   Acute encephalopathy with bouts of agitation,: Initially thought be secondary to infection and metabolic .  Much improved today.  EEG pending .   Hypertension: suboptimal.  Add prn hydralazine.        DVT prophylaxis: sq heparin. Code Status: do not intubate only. Family Communication: discussed with daughter and wife at bedside.  Disposition Plan: pending PT evaluation. .    Consultants:   Cardiology Dr Wynonia Lawman.  PCCM  ID Dr Linus Salmons  IR  Neuro surgery. Dr Parks Neptune.  Procedures: MRI LUMBAR SPINE.   EEG.   Antimicrobials:  Vancomycin  Zosyn 7/19 Azithromycin 7/19  Subjective:  reports back pain is resolved.  No chest pain or sob.  Objective: Vitals:   10/06/15 2340 10/07/15 0045 10/07/15 0426 10/07/15 0901  BP:  (!) 153/30 (!) 159/88 (!) 160/85  Pulse:   92 83  Resp:  19 (!) 32 (!) 32  Temp: 98.5 F (36.9 C)  97.7 F (36.5 C) 98.1 F (36.7 C)  TempSrc: Oral  Oral Oral  SpO2:  98% 96% 97%  Weight:   88.3 kg (194 lb 10.7 oz)   Height:        Intake/Output Summary (Last 24 hours) at 10/07/15 1424 Last data filed at 10/07/15 1100  Gross per 24 hour  Intake              820 ml  Output              350 ml  Net              470 ml   Filed Weights   10/04/15 0500 10/06/15 0449 10/07/15 0426  Weight: 90.9 kg (200 lb 6.4 oz) 87.7 kg (193 lb 5.5 oz) 88.3 kg (194 lb 10.7 oz)    Examination:  General exam: sleeping but arousable. Not in any distress.   Respiratory system: diminished at bases, no wheezing or rhonchi.  Cardiovascular system: irregular, s1s2, rate controlled.  Gastrointestinal system: Abdomen is nondistended, soft and nontender. No organomegaly or masses felt. Normal bowel sounds heard. Central nervous system: alert but still  confused, appear to be at baseline.  Skin: No rashes, lesions or ulcers     Data Reviewed: I have personally reviewed following labs and imaging studies  CBC:  Recent Labs Lab 10/01/15 2145 10/02/15 0600 10/04/15 0101 10/05/15 1520 10/07/15 0528  WBC 1.6* 1.8* 1.9* 3.2* 5.0  NEUTROABS 0.1*  --   --   --   --   HGB 10.1* 9.1* 10.1* 10.0* 9.0*  HCT 31.0* 28.1* 31.9* 32.0* 28.2*  MCV 90.6 91.8 91.7 91.4 90.1  PLT 218 210 257 261 [id number]   Basic Metabolic Panel:  Recent Labs Lab 10/01/15 2145 10/02/15 0600 10/03/15 0725 10/04/15 0101 10/05/15 1520 10/06/15 0603 10/07/15 0528  NA 149* 147* 144 147* 146* 147* 144  K 2.9* 3.6 3.3* 4.1 3.6 3.1* 3.0*  CL 114* 114* 113* 118*  116* 115* 110  CO2 26 22 23  21* 24 24 26   GLUCOSE 159* 255* 269* 174* 157* 76 121*  BUN 32* 33* 38* 32* 18 16 13   CREATININE 2.33* 2.28* 2.36* 2.26* 1.85* 1.85* 1.85*  CALCIUM 9.2 8.2* 8.4* 8.6* 8.9 8.7* 8.3*  MG 1.7 1.6* 1.8  --   --   --   --    GFR: Estimated Creatinine Clearance: 36.7 mL/min (by C-G formula based on SCr of 1.85 mg/dL). Liver Function Tests:  Recent Labs Lab 10/01/15 2145 10/02/15 0600 10/04/15 0101  AST 14* 12* 13*  ALT 14* 13* 14*  ALKPHOS 117 99 98  BILITOT 1.7* 1.4* 1.1  PROT 5.3* 4.5* 5.1*  ALBUMIN 2.5* 2.1* 2.3*   No results for input(s): LIPASE, AMYLASE in the last 168 hours. No results for input(s): AMMONIA in the last 168 hours. Coagulation Profile:  Recent Labs Lab 10/03/15 1925  INR 1.43   Cardiac Enzymes:  Recent Labs Lab 10/02/15 0948 10/02/15 1424 10/03/15 1022 10/03/15 1925 10/04/15 0101  TROPONINI 1.02* 0.86* 0.64* 0.57* 0.51*   BNP (last 3 results) No results for input(s): PROBNP in the last 8760 hours. HbA1C: No results for input(s): HGBA1C in the last 72 hours. CBG:  Recent Labs Lab 10/06/15 1948 10/06/15 2331 10/07/15 0424 10/07/15 0900 10/07/15 1141  GLUCAP 123* 111* 108* 121* 149*   Lipid Profile: No results for input(s):  CHOL, HDL, LDLCALC, TRIG, CHOLHDL, LDLDIRECT in the last 72 hours. Thyroid Function Tests: No results for input(s): TSH, T4TOTAL, FREET4, T3FREE, THYROIDAB in the last 72 hours. Anemia Panel: No results for input(s): VITAMINB12, FOLATE, FERRITIN, TIBC, IRON, RETICCTPCT in the last 72 hours. Sepsis Labs:  Recent Labs Lab 10/02/15 0009 10/02/15 0317  LATICACIDVEN 1.53 1.09    Recent Results (from the past 240 hour(s))  Culture, blood (Routine X 2) w Reflex to ID Panel     Status: None   Collection Time: 10/01/15 11:40 PM  Result Value Ref Range Status   Specimen Description BLOOD LEFT ARM  Final   Special Requests   Final    BOTTLES DRAWN AEROBIC AND ANAEROBIC 10CC PT ON VANC ZOSYN   Culture NO GROWTH 5 DAYS  Final   Report Status 10/07/2015 FINAL  Final  Culture, blood (Routine X 2) w Reflex to ID Panel     Status: None   Collection Time: 10/01/15 11:45 PM  Result Value Ref Range Status   Specimen Description BLOOD LEFT HAND  Final   Special Requests IN PEDIATRIC BOTTLE 4CC  Final   Culture NO GROWTH 5 DAYS  Final   Report Status 10/07/2015 FINAL  Final  Urine culture     Status: Abnormal   Collection Time: 10/01/15 11:57 PM  Result Value Ref Range Status   Specimen Description URINE, RANDOM  Final   Special Requests NONE  Final   Culture >=100,000 COLONIES/mL YEAST (A)  Final   Report Status 10/03/2015 FINAL  Final  MRSA PCR Screening     Status: Abnormal   Collection Time: 10/02/15  9:40 AM  Result Value Ref Range Status   MRSA by PCR POSITIVE (A) NEGATIVE Final    Comment:        The GeneXpert MRSA Assay (FDA approved for NASAL specimens only), is one component of a comprehensive MRSA colonization surveillance program. It is not intended to diagnose MRSA infection nor to guide or monitor treatment for MRSA infections. RESULT CALLED TO, READ BACK BY AND VERIFIED WITH: Bernarda Caffey RN  11:45 10/02/15 ([email or web address])   Aerobic/Anaerobic Culture (surgical/deep wound)      Status: None (Preliminary result)   Collection Time: 10/03/15  5:00 PM  Result Value Ref Range Status   Specimen Description ABSCESS BACK  Final   Special Requests NONE  Final   Gram Stain   Final    FEW WBC PRESENT,BOTH PMN AND MONONUCLEAR NO ORGANISMS SEEN    Culture   Final    NO GROWTH 2 DAYS NO ANAEROBES ISOLATED; CULTURE IN Jacob FOR 5 DAYS   Report Status PENDING  Incomplete         Radiology Studies: No results found.      Scheduled Meds: . amiodarone  200 mg Oral BID  . antiseptic oral rinse  7 mL Mouth Rinse q12n4p  . chlorhexidine  15 mL Mouth Rinse BID  . cycloSPORINE  1 drop Both Eyes BID  . finasteride  5 mg Oral QHS  . heparin  5,000 Units Subcutaneous Q8H  . insulin aspart  0-15 Units Subcutaneous TID WC  . metoprolol tartrate  25 mg Oral TID  . mometasone-formoterol  2 puff Inhalation BID  . pilocarpine  1 drop Both Eyes TID  . potassium chloride  40 mEq Oral BID  . predniSONE  7 mg Oral Q breakfast  . sodium chloride flush  3 mL Intravenous Q12H  . tamsulosin  0.4 mg Oral QHS   Continuous Infusions: . dextrose 50 mL/hr at 10/07/15 1141     LOS: 5 days    Time spent: 30 minutes.     Hosie Poisson, MD [facility] Pager [phone]   If 7PM-7AM, please contact night-coverage [email or web address] Password [facility] 10/07/2015, 2:24 PM

## 2015-10-07 NOTE — Telephone Encounter (Signed)
Noted  

## 2015-10-07 NOTE — Progress Notes (Signed)
Report called to RN 5w01. Pt was transported to 5w01 and wife was notified.

## 2015-10-07 NOTE — Progress Notes (Signed)
CSW spoke with pt dtr concerning placement needs- dtr feels confident pt will be in hospital for a long time but agreeable to CSW looking into other SNF options for consideration.  CSW also clarified wknd conversation regarding insurance coverage at SNF- [facility] reports that pt still has SNF days left but pt is in copay days and now has an outstanding bill  CSW faxed referral to SNFs for review   Domenica Reamer, [place] Social Worker [phone]

## 2015-10-07 NOTE — Progress Notes (Signed)
Bedside EEG completed.  Pt very anxious, not cooperative, moving.  Results pending.

## 2015-10-08 LAB — BASIC METABOLIC PANEL
Anion gap: 11 (ref 5–15)
BUN: 11 mg/dL (ref 6–20)
CO2: 22 mmol/L (ref 22–32)
Calcium: 8.3 mg/dL — ABNORMAL LOW (ref 8.9–10.3)
Chloride: 110 mmol/L (ref 101–111)
Creatinine, Ser: 1.89 mg/dL — ABNORMAL HIGH (ref 0.61–1.24)
GFR calc Af Amer: 38 mL/min — ABNORMAL LOW (ref >60)
GFR calc non Af Amer: 33 mL/min — ABNORMAL LOW (ref >60)
Glucose, Bld: 109 mg/dL — ABNORMAL HIGH (ref 65–99)
Potassium: 3.6 mmol/L (ref 3.5–5.1)
Sodium: 143 mmol/L (ref 135–145)

## 2015-10-08 LAB — AEROBIC/ANAEROBIC CULTURE W GRAM STAIN (SURGICAL/DEEP WOUND): Culture: NO GROWTH

## 2015-10-08 LAB — CBC
HCT: 32.1 % — ABNORMAL LOW (ref 39.0–52.0)
Hemoglobin: 10.2 g/dL — ABNORMAL LOW (ref 13.0–17.0)
MCH: 29.3 pg (ref 26.0–34.0)
MCHC: 31.8 g/dL (ref 30.0–36.0)
MCV: 92.2 fL (ref 78.0–100.0)
Platelets: 265 10*3/uL (ref 150–400)
RBC: 3.48 MIL/uL — ABNORMAL LOW (ref 4.22–5.81)
RDW: 16.5 % — ABNORMAL HIGH (ref 11.5–15.5)
WBC: 5.4 10*3/uL (ref 4.0–10.5)

## 2015-10-08 LAB — GLUCOSE, CAPILLARY
Glucose-Capillary: 112 mg/dL — ABNORMAL HIGH (ref 65–99)
Glucose-Capillary: 172 mg/dL — ABNORMAL HIGH (ref 65–99)
Glucose-Capillary: 161 mg/dL — ABNORMAL HIGH (ref 65–99)
Glucose-Capillary: 106 mg/dL — ABNORMAL HIGH (ref 65–99)

## 2015-10-08 MED ORDER — AMIODARONE HCL 200 MG PO TABS
200.0000 mg | ORAL_TABLET | Freq: Every day | ORAL | Status: DC
  Administered 2015-10-09 – 2015-10-14 (×6): 200 mg via ORAL
  Filled 2015-10-08 (×6): qty 1

## 2015-10-08 MED ORDER — METOPROLOL TARTRATE 50 MG PO TABS
50.0000 mg | ORAL_TABLET | Freq: Two times a day (BID) | ORAL | Status: DC
  Administered 2015-10-08 – 2015-10-14 (×12): 50 mg via ORAL
  Filled 2015-10-08 (×12): qty 1

## 2015-10-08 MED ORDER — APIXABAN 5 MG PO TABS
5.0000 mg | ORAL_TABLET | Freq: Two times a day (BID) | ORAL | Status: DC
  Administered 2015-10-08 – 2015-10-14 (×13): 5 mg via ORAL
  Filled 2015-10-08 (×13): qty 1

## 2015-10-08 MED ORDER — GLUCERNA SHAKE PO LIQD
237.0000 mL | Freq: Three times a day (TID) | ORAL | Status: DC
  Administered 2015-10-08 – 2015-10-14 (×19): 237 mL via ORAL
  Filled 2015-10-08 (×2): qty 237

## 2015-10-08 NOTE — Progress Notes (Signed)
Physical Therapy Treatment Patient Details Name: Jacob [name] MRN: [id number] DOB: 12-03-1940 Today's Date: 10/08/2015    History of Present Illness Pt is a 75 y/o M from [facility] facility who presented with AMS.  Pt was recently hospitalized with discitis. MRI spine on admission showed fluid collection and fluid collection done 7/20. Admitting dx: sepsis of unclear source. Pt's PMH includes polymyalgia rheumatica, COPD, anemia, anxiety, hepatitis, CABG, lumbar laminectomy/decompression microdiscectomy 09/06/15.      PT Comments    Patient is making progress toward mobility goals with improved bed mobility and sitting balance this session. Tolerated standing EOB X2. Continue to progress as tolerated with anticipated d/c to SNF for further skilled PT services.    Follow Up Recommendations  SNF;Supervision/Assistance - 24 hour     Equipment Recommendations  Other (comment) (TBD at next venue of care)    Recommendations for Other Services OT consult     Precautions / Restrictions Precautions Precautions: Fall Restrictions Weight Bearing Restrictions: No    Mobility  Bed Mobility Overal bed mobility: Needs Assistance Bed Mobility: Supine to Sit;Sit to Supine     Supine to sit: Mod assist Sit to supine: Min assist   General bed mobility comments: cues for technique and hand placement with use of rail and HHA to elevate trunk into sitting and scoot hips to EOB with use of bed pad; min A to elevate bilat LE into bed  Transfers Overall transfer level: Needs assistance Equipment used: Rolling walker (2 wheeled) Transfers: Sit to/from Stand Sit to Stand: Min assist;+2 physical assistance;From elevated surface         General transfer comment: cues for safe hand placement and technique; min A +2 to power up into standing and safe hand placement on RW upon standing; X2 from EOB; pt c/o dizziness upon standing; VSS throughout session  Ambulation/Gait Ambulation/Gait  assistance: Mod assist;+2 safety/equipment Ambulation Distance (Feet):  (sidesteps toward HOB ~51ft) Assistive device: Rolling walker (2 wheeled) Gait Pattern/deviations: Step-to pattern     General Gait Details: cues and assist to weight shift and for balance to achieve steps along EOB   Stairs            Wheelchair Mobility    Modified Rankin (Stroke Patients Only)       Balance Overall balance assessment: Needs assistance   Sitting balance-Leahy Scale: Fair       Standing balance-Leahy Scale: Poor                      Cognition Arousal/Alertness: Awake/alert Behavior During Therapy: Flat affect Overall Cognitive Status: No family/caregiver present to determine baseline cognitive functioning                      Exercises      General Comments General comments (skin integrity, edema, etc.): pt had not eaten lunch and reported not eating breakfast today due to c/o his stomach being upset--RN aware      Pertinent Vitals/Pain Pain Assessment: No/denies pain    Home Living                      Prior Function            PT Goals (current goals can now be found in the care plan section) Acute Rehab PT Goals Patient Stated Goal: be with wife PT Goal Formulation: Patient unable to participate in goal setting Time For Goal Achievement: 10/19/15 Potential to Achieve  Goals: Fair Progress towards PT goals: Progressing toward goals    Frequency  Min 2X/week    PT Plan Current plan remains appropriate    Co-evaluation             End of Session Equipment Utilized During Treatment: Gait belt Activity Tolerance: Patient limited by fatigue Patient left: with call bell/phone within reach;in bed;with bed alarm set     Time: [id number] PT Time Calculation (min) (ACUTE ONLY): 34 min  Charges:  $Therapeutic Activity: 23-37 mins                    G Codes:      [place]ina April, PTA Pager: [phone]   10/08/2015, 3:59 PM

## 2015-10-08 NOTE — Progress Notes (Signed)
Subjective:  Currently on telemetry floor.  Atrial fibrillation rate now controlled.  He is depressed today and still quite weak.  Not short of breath.  He does recognize me today and is not currently confused.  He complains of nausea and no appetite.  Objective:  Vital Signs in the last 24 hours: BP (!) 166/78 (BP Location: Right Arm)   Pulse 90   Temp 98.1 F (36.7 C)   Resp 17   Ht 5\' 11"  (1.803 m)   Wt 89 kg (196 lb 4.8 oz)   SpO2 98%   BMI 27.38 kg/m   Physical Exam: Elderly male sitting at bedside with physical therapy in no acute distress  Lungs:  Clear Cardiac:  irregular rhythm, normal S1 and S2, no S3 Abdomen:  Soft, nontender, no masses Extremities: Trace edema present  Intake/Output from previous day: 07/24 0701 - 07/25 0700 In: 120 [P.O.:120] Out: 1000 [Urine:1000]  Weight Filed Weights   10/06/15 0449 10/07/15 0426 10/07/15 1629  Weight: 87.7 kg (193 lb 5.5 oz) 88.3 kg (194 lb 10.7 oz) 89 kg (196 lb 4.8 oz)    Lab Results: Basic Metabolic Panel:  Recent Labs  10/07/15 0528 10/08/15 0534  NA 144 143  K 3.0* 3.6  CL 110 110  CO2 26 22  GLUCOSE 121* 109*  BUN 13 11  CREATININE 1.85* 1.89*   CBC:  Recent Labs  10/07/15 0528 10/08/15 0534  WBC 5.0 5.4  HGB 9.0* 10.2*  HCT 28.2* 32.1*  MCV 90.1 92.2  PLT 259 265   Telemetry: Atrial fibrillation Rate currently controlled  Assessment/Plan:  1.  Paroxysmal atrial fibrillation -rate is now controlled on oral medication. 2.  Coronary artery disease with previous bypass grafting 3.  Elevated troponin suggestive of demand ischemia likely due to rapid atrial fibrillation and sepsis 4.  Hypertensive heart disease 5.  Nausea possibly due to amiodarone   Recommendations:  Rate is better controlled at the present time with his atrial fibrillation.  I spoke to the hospitalists earlier today.  We have felt he should be anticoagulated and discussed Eliquis as anticoagulation light of his renal  function.  Because of the nausea I would reduce his amiodarone to 200 mg daily and increase metoprolol to 50 mg twice daily now that his pressure is higher.  Kerry Hough  MD [facility] Cardiology  10/08/2015, 1:38 PM

## 2015-10-08 NOTE — Discharge Instructions (Signed)

## 2015-10-08 NOTE — Care Management Important Message (Signed)
Important Message  Patient Details  Name: Jacob [name] MRN: [name] Date of Birth: [birth year]   Medicare Important Message Given:  Yes    Carles Collet, RN 10/08/2015, 12:25 PM

## 2015-10-08 NOTE — Progress Notes (Signed)
Initial Nutrition Assessment  DOCUMENTATION CODES:   Non-severe (moderate) malnutrition in context of chronic illness  INTERVENTION:   -Glucerna Shake po TID, each supplement provides 220 kcal and 10 grams of protein  NUTRITION DIAGNOSIS:   Malnutrition related to chronic illness as evidenced by mild depletion of muscle mass, mild depletion of body fat, energy intake < 75% for > or equal to 1 month, percent weight loss.  GOAL:   Patient will meet greater than or equal to 90% of their needs  MONITOR:   PO intake, Supplement acceptance, Weight trends, Skin, I & O's  REASON FOR ASSESSMENT:   Low [place]    ASSESSMENT:   KHALIB KERNS is a 75 y.o. male with medical history significant of CAD, CABG, IDDM, COPD, dyslipidemia, RCC, PMR, HTN, CKD, OSA , lumbar stenosis s/p correction, and recently was hospitalized with discitis for which a PICC line was placed for IV antibiotics of vancomycin that he received a [place] facility; who presented with complaints of being found acutely altered.  Pt admitted with sepsis and a-fib with RVR.   Pt very confused at time of visit. He reports feeling very poorly. Pt became anxious during periods of interview, despite efforts to reorient pt by this RD. He believes he's at "[facility]" and reports "I went to court, fell asleep, and woke up here".   Pt endorses poor appetite. Meal completion 0-5%. Reviewed records from previous admission last month. Intake has historically poor. Pt reports that he does drink Glucerna supplements; RD to order.   Reviewed wt hx, which revealed wt loss trend at least over the past 6 months. Pt has experienced an 18% wt loss over the past 6 months.   Nutrition-Focused physical exam completed. Findings are mild to modertae fat depletion, mild to moderate muscle depletion, and moderate edema.   Per chart review, noted pt underwent paraspinal collection aspiration on 10/03/15 for possible discitis.    Labs reviewed.   Diet Order:  DIET DYS 3 Room service appropriate? Yes; Fluid consistency: Thin  Skin:  Reviewed, no issues  Last BM:  10/07/15  Height:   Ht Readings from Last 1 Encounters:  10/07/15 5\' 11"  (1.803 m)    Weight:   Wt Readings from Last 1 Encounters:  10/07/15 196 lb 4.8 oz (89 kg)    Ideal Body Weight:  78.2 kg  BMI:  Body mass index is 27.38 kg/m.  Estimated Nutritional Needs:   Kcal:  1800-2000  Protein:  95-105 grams  Fluid:  1.8-2.0 L  EDUCATION NEEDS:   Education needs no appropriate at this time  Romelo Sciandra A. Jimmye Norman, RD, LDN, CDE Pager: [phone] After hours Pager: [phone]

## 2015-10-08 NOTE — Consult Note (Signed)
   [facility] CM Inpatient Consult   10/08/2015  SAJID KNEIFL 1940/09/26 [id number]  Follow up on patient's progress. Patient with staff member currently and crying on and off.  Patient's chart review reveals patient is for skilled nursing post hospital disposition per notes. Admitted with atrial fib.   Natividad Brood, RN BSN [facility] Liaison  [phone] business mobile phone Toll free office [phone]

## 2015-10-08 NOTE — Progress Notes (Signed)
ANTICOAGULATION CONSULT NOTE - Initial Consult  Pharmacy Consult for Eliquis ( apixaban) Indication: atrial fibrillation  Allergies  Allergen Reactions  . Ace Inhibitors Other (See Comments)    Probably nausea and vomiting per patient   . Actonel [Risedronate] Nausea And Vomiting  . Ciprocinonide [Fluocinolone] Other (See Comments)    Probably nausea and vomiting per patient  . Flunisolide Other (See Comments)    Probably nausea and vomiting per patient   . Metformin And Related Other (See Comments)    Probably nausea and vomiting per patient   . Sertraline Other (See Comments)    Probably nausea and vomiting per patient   . Sulindac Other (See Comments)    Probably nausea and vomiting per patient   . Terazosin Other (See Comments)    Probably nausea and vomiting per patient     Patient Measurements: Height: 5\' 11"  (180.3 cm) Weight: 196 lb 4.8 oz (89 kg) IBW/kg (Calculated) : 75.3  Vital Signs: Temp: 98.1 F (36.7 C) (07/25 0628) BP: 166/78 (07/25 0628) Pulse Rate: 90 (07/25 0628)  Labs:  Recent Labs  10/05/15 1520 10/06/15 0603 10/07/15 0528 10/08/15 0534  HGB 10.0*  --  9.0* 10.2*  HCT 32.0*  --  28.2* 32.1*  PLT 261  --  259 265  CREATININE 1.85* 1.85* 1.85* 1.89*    Estimated Creatinine Clearance: 36 mL/min (by C-G formula based on SCr of 1.89 mg/dL).   Medical History: Past Medical History:  Diagnosis Date  . Allergy   . Anemia   . Anxiety   . Arthritis   . Asthma   . BPH (benign prostatic hyperplasia)   . Cancer of kidney ([place])   . Cataract   . Chronic kidney disease    chronic  kidney failure  kidney function at 42%  . COPD (chronic obstructive pulmonary disease) ([place])   . Coronary artery disease    CABG  7 bypasses  . Diabetes mellitus without complication ([place])   . Hepatitis    many years ago  . Hypertension   . Polymyalgia rheumatica (HCC)    maintained on Prednisone, Plaquenil. Followed by rhuematology every 4 months/[name].   . Shortness of breath dyspnea    with exertion  . Sleep apnea    CPAP   Trying to use    Medications:  Scheduled:  . amiodarone  200 mg Oral BID  . antiseptic oral rinse  7 mL Mouth Rinse q12n4p  . chlorhexidine  15 mL Mouth Rinse BID  . cycloSPORINE  1 drop Both Eyes BID  . finasteride  5 mg Oral QHS  . heparin  5,000 Units Subcutaneous Q8H  . insulin aspart  0-15 Units Subcutaneous TID WC  . metoprolol tartrate  25 mg Oral TID  . mometasone-formoterol  2 puff Inhalation BID  . pilocarpine  1 drop Both Eyes TID  . predniSONE  7 mg Oral Q breakfast  . sodium chloride flush  3 mL Intravenous Q12H  . tamsulosin  0.4 mg Oral QHS   Infusions:   PRN: acetaminophen **OR** acetaminophen, albuterol, ipratropium, ondansetron **OR** ondansetron (ZOFRAN) IV  Assessment: RK is a 75 yo male with PMH significant for CAD, CABG, IDDM , COPD, dyslipidemia, HTN, CKD, and a-fib.  Patient is currently taking lopressor and amiodarone for rate control. Pharmacy has been consulted for apixaban dosing for atrial fibrillation.   SCr- 1.89 Age- 75 Weight- 89 kg  Goal of Therapy:  Monitor platelets by anticoagulation protocol: Yes   Plan:  Initiate  apixaban 5 mg BID.  Monitor renal function, s/sx of bleeding.  Monitor CBC. Patient needs education prior to discharge.  Pharmacy will sign-off and monitor peripherally.  [place], [place] Resident [phone] 10/08/2015,10:16 AM

## 2015-10-08 NOTE — Progress Notes (Addendum)
PROGRESS NOTE    Jacob [name]  [id number] DOB: 24-Apr-1940 DOA: 10/01/2015 PCP: No primary care provider on file.    Brief Narrative: Jacob [name] is a 75 y.o. male with medical history significant of CAD, CABG, IDDM, COPD, dyslipidemia, RCC, PMR, HTN, CKD, OSA , lumbar stenosis s/p correction, and recently was hospitalized with discitis,  was on rocephin and vancomycin till 7/13, presents for altered mental status. On arrival to ED, he was found to be febrile, tachycardic, hypotensive, in a fib with RVR, . He was admitted to step down for further management.  Cardiology and PCCM consulted.  PCCM signed off recommending a repeat MRI of the lumbar spine. MRI spine showed fluid collection , suspect abscess, NS and IR consulted for further eval.  CT guided aspiration of the fluid collection done on 7/20 and cultures are pending. No organisms seen.  ID on board to help [name] with antibiotics.  PT repeat eval requested to see if he is eligible for inpatient rehab.  Assessment & Plan:   Principal Problem:   Sepsis ([place]) Active Problems:   Diabetes mellitus, type 2 ([place])   CHF with left ventricular diastolic dysfunction, NYHA class 2 (HCC)   Acute encephalopathy   Elevated troponin   Acute kidney injury superimposed on chronic kidney disease (HCC)   Atrial fibrillation with RVR (HCC)   Hypotension   Abscess   AKI (acute kidney injury) ([place])  Sepsis of unclear source possibly from health care associated pneumonia vs recurrent discitis.  CXR shows left lobe air space consolidation.  MRI of the lumbar spine ordered to further evaluate the discitis.  It showed 1.4 x 2.5 x 3.3 cm rim enhancing paraspinal fluid collection concerning for abscess. 7 x 15 mm fluid collection in RIGHT laminectomy defect equivocal for abscess, and could represent seroma or possibly hematoma. IR consulted and he underwent CT guided aspiration of the fluid collection , the aspirate is negative for organisms, few wbc  seen and cultures have been negative so far. Requested neuro surgery consult in view of the abnormal MRI, suggested probably not an abscess as the cultures have been negative.  Of note he just completed 4 weeks of vancomycin and rocephin on 7/13.  He was started on IV vancomycin and IV zosyn, IV azithromycin on admission , later transitioned to zosyn alone, which was discontinued on 7/24 as all the cultures have been negative and a repeat CXR did not show any consolidation. Recommend watching him off antibiotics and plan for discharge in the 1 to 2 days.  Lactic acid is normalized, trend pro calcitonin.  Blood cultures negative so far.  He is afebrile and repeat labs to check WBC count, show normal wbc count.     Atrial fibrillation with RVR:  NSR today. Currently on amiodarone, dig and beta blocker.  Serial troponins are elevated and peaked at 0.9  and Dr Wynonia Lawman on board for further recommendations.  Dig  And beta blocker added by cardiology. Awaiting cardiology recommendations regarding anticoagulation. Started the patient on Eliquis today after discussing with Dr Wynonia Lawman.    Acute kidney injury on STAGE 3 CKD; Unclear etiology, sepsis, dehydration.  UA shows SMALL LEUKOCYTOSIS with few bacteria and wbc.  Urine cultures done , show yeast, one dose of diflucan ordered.  US renal ordered and currently on IV fluids. US renal does not show any hydronephrosis.   Pt is incontinent and urine output couldn't be measure appropriately.  Monitor renal parameters. Repeat labs show improvement.  Baseline creatinine between 1.2 to 1.4, currently it has improved to 1.8 from 2.8, and has remained at 1.8 the last three days.    IDDM: SLP  EVAL recommended restarting  Dysphagia 3 diet.  CBG (last 3)   Recent Labs  10/07/15 1714 10/07/15 2258 10/08/15 0741  GLUCAP 151* 161* 112*  resume SSI.    Nutrition: Regular diet with supplementations.   Hypernatremia: Probably from dehydration and Poor  po intake.  Resolved after fluid hydration.    Leukopenia,  Unclear etiology. Probably from IV antibiotics.  Resolved.  Anemia: Baseline hemoglobin between 9 to 10,  Stable. Monitor.   COPD: No wheezing heard, resume Symbicort and duo nebs as needed.   PMR:  On  Maintenance dose of prednisone, continue the same.      Hypokalemia: replete as needed. Repeat levels normal.   Acute encephalopathy with bouts of agitation,: Initially thought be secondary to infection and metabolic .  Much improved, no episodes of agitation tonight.  EEG is sub optimal study as pt would not stay still , but it  Showed diffuse cerebral dysfunction, non specific in nature. , no epileptiform discharges seen.  MRI was ordered, but patient could not tolerate it even with ativan. We discontinued the test after discussing with the family.   Hypertension: suboptimal.  Add prn hydralazine.       DVT prophylaxis: sq heparin. Code Status: do not intubate only. Family Communication: none at bedside.  Disposition Plan: pending PT evaluation. . Family requesting to be discharged to inpatient.    Consultants:   Cardiology Dr Wynonia Lawman.  PCCM  ID Dr Linus Salmons  IR  Neuro surgery. Dr Parks Neptune.  Procedures:  MRI LUMBAR SPINE.  EEG.  Antimicrobials:  Vancomycin 7/19 to 7/20/ Zosyn 7/19- 7/24  Azithromycin 7/19  Subjective: Denies any chest pain or sob or back pain.  Objective: Vitals:   10/07/15 2100 10/07/15 2256 10/08/15 0628 10/08/15 0909  BP:  (!) 160/62 (!) 166/78   Pulse: 81 73 90   Resp: 20 18 17    Temp:   98.1 F (36.7 C)   TempSrc:      SpO2: 96% 97% 99% 98%  Weight:      Height:        Intake/Output Summary (Last 24 hours) at 10/08/15 0946 Last data filed at 10/08/15 0851  Gross per 24 hour  Intake              120 ml  Output             1000 ml  Net             -880 ml   Filed Weights   10/06/15 0449 10/07/15 0426 10/07/15 1629  Weight: 87.7 kg (193 lb 5.5 oz) 88.3 kg  (194 lb 10.7 oz) 89 kg (196 lb 4.8 oz)    Examination:  General exam: comfortable, and alert. Respiratory system: diminished at bases, no wheezing or rhonchi.  Cardiovascular system: regular , s1s2, rate controlled.  Gastrointestinal system: Abdomen is nondistended, soft and nontender. No organomegaly or masses felt. Normal bowel sounds heard. Central nervous system: alert but still confused, appear to be at baseline.  Skin: No rashes, lesions or ulcers     Data Reviewed: I have personally reviewed following labs and imaging studies  CBC:  Recent Labs Lab 10/01/15 2145 10/02/15 0600 10/04/15 0101 10/05/15 1520 10/07/15 0528 10/08/15 0534  WBC 1.6* 1.8* 1.9* 3.2* 5.0 5.4  NEUTROABS 0.1*  --   --   --   --   --  HGB 10.1* 9.1* 10.1* 10.0* 9.0* 10.2*  HCT 31.0* 28.1* 31.9* 32.0* 28.2* 32.1*  MCV 90.6 91.8 91.7 91.4 90.1 92.2  PLT 218 210 257 261 259 [id number]   Basic Metabolic Panel:  Recent Labs Lab 10/01/15 2145 10/02/15 0600 10/03/15 0725 10/04/15 0101 10/05/15 1520 10/06/15 0603 10/07/15 0528 10/08/15 0534  NA 149* 147* 144 147* 146* 147* 144 143  K 2.9* 3.6 3.3* 4.1 3.6 3.1* 3.0* 3.6  CL 114* 114* 113* 118* 116* 115* 110 110  CO2 26 22 23  21* 24 24 26 22   GLUCOSE 159* 255* 269* 174* 157* 76 121* 109*  BUN 32* 33* 38* 32* 18 16 13 11   CREATININE 2.33* 2.28* 2.36* 2.26* 1.85* 1.85* 1.85* 1.89*  CALCIUM 9.2 8.2* 8.4* 8.6* 8.9 8.7* 8.3* 8.3*  MG 1.7 1.6* 1.8  --   --   --  1.4*  --    GFR: Estimated Creatinine Clearance: 36 mL/min (by C-G formula based on SCr of 1.89 mg/dL). Liver Function Tests:  Recent Labs Lab 10/01/15 2145 10/02/15 0600 10/04/15 0101  AST 14* 12* 13*  ALT 14* 13* 14*  ALKPHOS 117 99 98  BILITOT 1.7* 1.4* 1.1  PROT 5.3* 4.5* 5.1*  ALBUMIN 2.5* 2.1* 2.3*   No results for input(s): LIPASE, AMYLASE in the last 168 hours. No results for input(s): AMMONIA in the last 168 hours. Coagulation Profile:  Recent Labs Lab 10/03/15 1925    INR 1.43   Cardiac Enzymes:  Recent Labs Lab 10/02/15 0948 10/02/15 1424 10/03/15 1022 10/03/15 1925 10/04/15 0101  TROPONINI 1.02* 0.86* 0.64* 0.57* 0.51*   BNP (last 3 results) No results for input(s): PROBNP in the last 8760 hours. HbA1C: No results for input(s): HGBA1C in the last 72 hours. CBG:  Recent Labs Lab 10/07/15 1141 10/07/15 1558 10/07/15 1714 10/07/15 2258 10/08/15 0741  GLUCAP 149* 154* 151* 161* 112*   Lipid Profile: No results for input(s): CHOL, HDL, LDLCALC, TRIG, CHOLHDL, LDLDIRECT in the last 72 hours. Thyroid Function Tests: No results for input(s): TSH, T4TOTAL, FREET4, T3FREE, THYROIDAB in the last 72 hours. Anemia Panel: No results for input(s): VITAMINB12, FOLATE, FERRITIN, TIBC, IRON, RETICCTPCT in the last 72 hours. Sepsis Labs:  Recent Labs Lab 10/02/15 0009 10/02/15 0317  LATICACIDVEN 1.53 1.09    Recent Results (from the past 240 hour(s))  Culture, blood (Routine X 2) w Reflex to ID Panel     Status: None   Collection Time: 10/01/15 11:40 PM  Result Value Ref Range Status   Specimen Description BLOOD LEFT ARM  Final   Special Requests   Final    BOTTLES DRAWN AEROBIC AND ANAEROBIC 10CC PT ON VANC ZOSYN   Culture NO GROWTH 5 DAYS  Final   Report Status 10/07/2015 FINAL  Final  Culture, blood (Routine X 2) w Reflex to ID Panel     Status: None   Collection Time: 10/01/15 11:45 PM  Result Value Ref Range Status   Specimen Description BLOOD LEFT HAND  Final   Special Requests IN PEDIATRIC BOTTLE 4CC  Final   Culture NO GROWTH 5 DAYS  Final   Report Status 10/07/2015 FINAL  Final  Urine culture     Status: Abnormal   Collection Time: 10/01/15 11:57 PM  Result Value Ref Range Status   Specimen Description URINE, RANDOM  Final   Special Requests NONE  Final   Culture >=100,000 COLONIES/mL YEAST (A)  Final   Report Status 10/03/2015 FINAL  Final  MRSA PCR  Screening     Status: Abnormal   Collection Time: 10/02/15  9:40 AM   Result Value Ref Range Status   MRSA by PCR POSITIVE (A) NEGATIVE Final    Comment:        The GeneXpert MRSA Assay (FDA approved for NASAL specimens only), is one component of a comprehensive MRSA colonization surveillance program. It is not intended to diagnose MRSA infection nor to guide or monitor treatment for MRSA infections. RESULT CALLED TO, READ BACK BY AND VERIFIED WITH: Bernarda Caffey RN 11:45 10/02/15 ([email or web address])   Aerobic/Anaerobic Culture (surgical/deep wound)     Status: None (Preliminary result)   Collection Time: 10/03/15  5:00 PM  Result Value Ref Range Status   Specimen Description ABSCESS BACK  Final   Special Requests NONE  Final   Gram Stain   Final    FEW WBC PRESENT,BOTH PMN AND MONONUCLEAR NO ORGANISMS SEEN    Culture   Final    NO GROWTH 2 DAYS NO ANAEROBES ISOLATED; CULTURE IN PROGRESS FOR 5 DAYS   Report Status PENDING  Incomplete         Radiology Studies: No results found.      Scheduled Meds: . amiodarone  200 mg Oral BID  . antiseptic oral rinse  7 mL Mouth Rinse q12n4p  . chlorhexidine  15 mL Mouth Rinse BID  . cycloSPORINE  1 drop Both Eyes BID  . finasteride  5 mg Oral QHS  . heparin  5,000 Units Subcutaneous Q8H  . insulin aspart  0-15 Units Subcutaneous TID WC  . metoprolol tartrate  25 mg Oral TID  . mometasone-formoterol  2 puff Inhalation BID  . pilocarpine  1 drop Both Eyes TID  . potassium chloride  40 mEq Oral BID  . predniSONE  7 mg Oral Q breakfast  . sodium chloride flush  3 mL Intravenous Q12H  . tamsulosin  0.4 mg Oral QHS   Continuous Infusions:     LOS: 6 days    Time spent: 30 minutes.     Hosie Poisson, MD [facility] Pager [phone]   If 7PM-7AM, please contact night-coverage [email or web address] Password [facility] 10/08/2015, 9:46 AM

## 2015-10-09 DIAGNOSIS — I4891 Unspecified atrial fibrillation: Secondary | ICD-10-CM

## 2015-10-09 DIAGNOSIS — I5030 Unspecified diastolic (congestive) heart failure: Secondary | ICD-10-CM

## 2015-10-09 DIAGNOSIS — N179 Acute kidney failure, unspecified: Secondary | ICD-10-CM

## 2015-10-09 LAB — BASIC METABOLIC PANEL
Anion gap: 13 (ref 5–15)
BUN: 10 mg/dL (ref 6–20)
CO2: 24 mmol/L (ref 22–32)
Calcium: 8.6 mg/dL — ABNORMAL LOW (ref 8.9–10.3)
Chloride: 107 mmol/L (ref 101–111)
Creatinine, Ser: 1.7 mg/dL — ABNORMAL HIGH (ref 0.61–1.24)
GFR calc Af Amer: 44 mL/min — ABNORMAL LOW (ref >60)
GFR calc non Af Amer: 38 mL/min — ABNORMAL LOW (ref >60)
Glucose, Bld: 117 mg/dL — ABNORMAL HIGH (ref 65–99)
Potassium: 3.1 mmol/L — ABNORMAL LOW (ref 3.5–5.1)
Sodium: 144 mmol/L (ref 135–145)

## 2015-10-09 LAB — GLUCOSE, CAPILLARY
Glucose-Capillary: 112 mg/dL — ABNORMAL HIGH (ref 65–99)
Glucose-Capillary: 120 mg/dL — ABNORMAL HIGH (ref 65–99)
Glucose-Capillary: 175 mg/dL — ABNORMAL HIGH (ref 65–99)
Glucose-Capillary: 212 mg/dL — ABNORMAL HIGH (ref 65–99)
Glucose-Capillary: 73 mg/dL (ref 65–99)

## 2015-10-09 MED ORDER — SODIUM CHLORIDE 0.9 % IV SOLN
INTRAVENOUS | Status: DC
  Administered 2015-10-09 – 2015-10-12 (×3): via INTRAVENOUS

## 2015-10-09 MED ORDER — HYDROXYZINE HCL 25 MG PO TABS
25.0000 mg | ORAL_TABLET | Freq: Once | ORAL | Status: AC
  Administered 2015-10-09: 25 mg via ORAL
  Filled 2015-10-09: qty 1

## 2015-10-09 MED ORDER — POTASSIUM CHLORIDE CRYS ER 20 MEQ PO TBCR
40.0000 meq | EXTENDED_RELEASE_TABLET | Freq: Once | ORAL | Status: AC
  Administered 2015-10-09: 40 meq via ORAL
  Filled 2015-10-09: qty 2

## 2015-10-09 MED ORDER — FLUCONAZOLE IN SODIUM CHLORIDE 200-0.9 MG/100ML-% IV SOLN
200.0000 mg | Freq: Once | INTRAVENOUS | Status: AC
  Administered 2015-10-09: 200 mg via INTRAVENOUS
  Filled 2015-10-09: qty 100

## 2015-10-09 NOTE — Care Management Note (Signed)
Case Management Note  Patient Details  Name: [name] MRN: [name] Date of Birth: 1940-11-16  Subjective/Objective:                 Patient from [facility]. IV Abx, cardiology adj meds, still confused. Anticipate DC to SNF Thursday/ Friday as facilitated by CSW.    Action/Plan:   Expected Discharge Date:                  Expected Discharge Plan:  [place]  In-House Referral:  Clinical Social Work  Discharge planning Services  NA  Post Acute Care Choice:  NA Choice offered to:  NA  DME Arranged:  N/A DME Agency:     HH Arranged:  NA HH Agency:  NA  Status of Service:  Completed, signed off  If discussed at [place] of Stay Meetings, dates discussed:    Additional Comments:  Carles Collet, RN 10/09/2015, 3:00 PM

## 2015-10-09 NOTE — Progress Notes (Signed)
CSW spoke with pt wife and dtr on the phone- bed offers left at pt bedside at family request- they will come to hospital later today to review  CSW will continue to follow  Domenica Reamer, [place] Worker [phone]

## 2015-10-09 NOTE — Progress Notes (Signed)
PROGRESS NOTE    Jacob [name]  [id number] DOB: Nov 20, 1940 DOA: 10/01/2015 PCP: No primary care provider on file.    Brief Narrative: Jacob [name] is a 75 y.o. male with medical history significant of CAD, CABG, IDDM, COPD, dyslipidemia, RCC, PMR, HTN, CKD, OSA , lumbar stenosis s/p correction, and recently was hospitalized with discitis,  was on rocephin and vancomycin till 7/13, presents for altered mental status. On arrival to ED, he was found to be febrile, tachycardic, hypotensive, in a fib with RVR, . He was admitted to step down for further management.  Cardiology and PCCM consulted.  PCCM signed off recommending a repeat MRI of the lumbar spine. MRI spine showed fluid collection , suspect abscess, NS and IR consulted for further eval.  CT guided aspiration of the fluid collection done on 7/20 and cultures are pending. No organisms seen.  ID on board to help [name] with antibiotics.  PT repeat eval requested to see if he is eligible for inpatient rehab.  Assessment & Plan:   Principal Problem:   Sepsis ([place]) Active Problems:   Diabetes mellitus, type 2 ([place])   CHF with left ventricular diastolic dysfunction, NYHA class 2 (H[facility])   Acute encephalopathy   Elevated troponin   Acute kidney injury superimposed on chronic kidney disease ([facility])   Atrial fibrillation with RVR ([facility])   Hypotension   Abscess   AKI (acute kidney injury) ([place])  Sepsis of unclear source possibly from health care associated pneumonia vs recurrent discitis.  CXR shows left lobe air space consolidation.  MRI of the lumbar spine ordered to further evaluate the discitis.  It showed 1.4 x 2.5 x 3.3 cm rim enhancing paraspinal fluid collection concerning for abscess. 7 x 15 mm fluid collection in RIGHT laminectomy defect equivocal for abscess, and could represent seroma or possibly hematoma. IR consulted and he underwent CT guided aspiration of the fluid collection , the aspirate is negative for organisms, few wbc  seen and cultures have been negative so far. Requested neuro surgery consult in view of the abnormal MRI, suggested probably not an abscess as the cultures have been negative.  Of note he just completed 4 weeks of vancomycin and rocephin on 7/13.  He was started on IV vancomycin and IV zosyn, IV azithromycin on admission , later transitioned to zosyn alone, which was discontinued on 7/24 as all the cultures have been negative and a repeat CXR did not show any consolidation. Recommend watching him off antibiotics and plan for discharge in the 1 to 2 days.  Lactic acid is normalized, trend pro calcitonin.  Blood cultures negative so far.  He is afebrile and repeat labs to check WBC count, show normal wbc count.     Atrial fibrillation with RVR:  NSR today. Currently on amiodarone, dig and beta blocker.  Serial troponins are elevated and peaked at 0.9  and Dr Wynonia Lawman on board for further recommendations.  Dig  And beta blocker added by cardiology. Awaiting cardiology recommendations regarding anticoagulation. Started the patient on Eliquis today after discussing with Dr Wynonia Lawman.    Acute kidney injury on STAGE 3 CKD; Unclear etiology, sepsis, dehydration.  UA shows SMALL LEUKOCYTOSIS with few bacteria and wbc.  Urine cultures done , show yeast, one dose of diflucan ordered.  US renal ordered and currently on IV fluids. US renal does not show any hydronephrosis.   Pt is incontinent and urine output couldn't be measure appropriately.  Monitor renal parameters. Repeat labs show improvement.  Baseline creatinine between 1.2 to 1.4, currently it has improved to 1.8 from 2.8, and has remained at 1.8 the last three days.    IDDM: SLP  EVAL recommended restarting  Dysphagia 3 diet.  CBG (last 3)   Recent Labs  10/08/15 2224 10/09/15 0306 10/09/15 0720  GLUCAP 106* 112* 120*  resume SSI.    Nutrition: Regular diet with supplementations.   Hypernatremia: Probably from dehydration and Poor  po intake.  Resolved after fluid hydration.    Leukopenia,  Unclear etiology. Probably from IV antibiotics.  Resolved.  Anemia: Baseline hemoglobin between 9 to 10,  Stable. Monitor.   COPD: No wheezing heard, resume Symbicort and duo nebs as needed.   PMR:  On  Maintenance dose of prednisone, continue the same.      Hypokalemia: replete as needed. Repeat levels normal.   Acute encephalopathy with bouts of agitation,: Initially thought be secondary to infection and metabolic .  Much improved, no episodes of agitation tonight.  EEG is sub optimal study as pt would not stay still , but it  Showed diffuse cerebral dysfunction, non specific in nature. , no epileptiform discharges seen.  MRI was ordered, but patient could not tolerate it even with ativan. We discontinued the test after discussing with the family.    Yeast in urine in an immunosuppressed individual (on daily prednisone) with continued confusion, will treat with diflucan  Hypertension: suboptimal.  Add prn hydralazine.       DVT prophylaxis: sq heparin. Code Status: do not intubate only. Family Communication: talked to daughter and wife over the phone o n 7/26  Disposition Plan: pending PT evaluation. . Family reports not happy with the care at previous SNF    Consultants:   Cardiology Dr Wynonia Lawman.  PCCM  ID Dr Linus Salmons  IR  Neuro surgery. Dr Parks Neptune.  Procedures:  MRI LUMBAR SPINE.  EEG.  Antimicrobials:  Vancomycin 7/19 to 7/20/ Zosyn 7/19- 7/24  Azithromycin 7/19  Subjective: Drowsy, falling back to sleep during conversation, reported  Back pain while sitting in chair, better after laying in bed, report feeling agitated, he is oriented to person, place and year, not to month, Denies any chest pain or sob or back pain.  Objective: Vitals:   10/08/15 0909 10/08/15 1447 10/08/15 2026 10/08/15 2142  BP:  (!) 149/70  (!) 160/77  Pulse:  84 89 88  Resp:  20 18   Temp:  98.1 F (36.7 C)      TempSrc:  Oral    SpO2: 98% 100% 98%   Weight:      Height:        Intake/Output Summary (Last 24 hours) at 10/09/15 1148 Last data filed at 10/09/15 0900  Gross per 24 hour  Intake              590 ml  Output              790 ml  Net             -200 ml   Filed Weights   10/06/15 0449 10/07/15 0426 10/07/15 1629  Weight: 87.7 kg (193 lb 5.5 oz) 88.3 kg (194 lb 10.7 oz) 89 kg (196 lb 4.8 oz)    Examination:  General exam: comfortable, and alert. Respiratory system: diminished at bases, no wheezing or rhonchi.  Cardiovascular system: regular , s1s2, rate controlled.  Gastrointestinal system: Abdomen is nondistended, soft and nontender. No organomegaly or masses felt. Normal bowel sounds heard. Central  nervous system: alert but still confused, appear to be at baseline.  Skin: No rashes, lesions or ulcers     Data Reviewed: I have personally reviewed following labs and imaging studies  CBC:  Recent Labs Lab 10/04/15 0101 10/05/15 1520 10/07/15 0528 10/08/15 0534  WBC 1.9* 3.2* 5.0 5.4  HGB 10.1* 10.0* 9.0* 10.2*  HCT 31.9* 32.0* 28.2* 32.1*  MCV 91.7 91.4 90.1 92.2  PLT 257 261 259 [id number]   Basic Metabolic Panel:  Recent Labs Lab 10/03/15 0725  10/05/15 1520 10/06/15 0603 10/07/15 0528 10/08/15 0534 10/09/15 0631  NA 144  < > 146* 147* 144 143 144  K 3.3*  < > 3.6 3.1* 3.0* 3.6 3.1*  CL 113*  < > 116* 115* 110 110 107  CO2 23  < > 24 24 26 22 24   GLUCOSE 269*  < > 157* 76 121* 109* 117*  BUN 38*  < > 18 16 13 11 10   CREATININE 2.36*  < > 1.85* 1.85* 1.85* 1.89* 1.70*  CALCIUM 8.4*  < > 8.9 8.7* 8.3* 8.3* 8.6*  MG 1.8  --   --   --  1.4*  --   --   < > = values in this interval not displayed. GFR: Estimated Creatinine Clearance: 40 mL/min (by C-G formula based on SCr of 1.7 mg/dL). Liver Function Tests:  Recent Labs Lab 10/04/15 0101  AST 13*  ALT 14*  ALKPHOS 98  BILITOT 1.1  PROT 5.1*  ALBUMIN 2.3*   No results for input(s): LIPASE, AMYLASE  in the last 168 hours. No results for input(s): AMMONIA in the last 168 hours. Coagulation Profile:  Recent Labs Lab 10/03/15 1925  INR 1.43   Cardiac Enzymes:  Recent Labs Lab 10/02/15 1424 10/03/15 1022 10/03/15 1925 10/04/15 0101  TROPONINI 0.86* 0.64* 0.57* 0.51*   BNP (last 3 results) No results for input(s): PROBNP in the last 8760 hours. HbA1C: No results for input(s): HGBA1C in the last 72 hours. CBG:  Recent Labs Lab 10/08/15 1159 10/08/15 1632 10/08/15 2224 10/09/15 0306 10/09/15 0720  GLUCAP 172* 161* 106* 112* 120*   Lipid Profile: No results for input(s): CHOL, HDL, LDLCALC, TRIG, CHOLHDL, LDLDIRECT in the last 72 hours. Thyroid Function Tests: No results for input(s): TSH, T4TOTAL, FREET4, T3FREE, THYROIDAB in the last 72 hours. Anemia Panel: No results for input(s): VITAMINB12, FOLATE, FERRITIN, TIBC, IRON, RETICCTPCT in the last 72 hours. Sepsis Labs: No results for input(s): PROCALCITON, LATICACIDVEN in the last 168 hours.  Recent Results (from the past 240 hour(s))  Culture, blood (Routine X 2) w Reflex to ID Panel     Status: None   Collection Time: 10/01/15 11:40 PM  Result Value Ref Range Status   Specimen Description BLOOD LEFT ARM  Final   Special Requests   Final    BOTTLES DRAWN AEROBIC AND ANAEROBIC 10CC PT ON VANC ZOSYN   Culture NO GROWTH 5 DAYS  Final   Report Status 10/07/2015 FINAL  Final  Culture, blood (Routine X 2) w Reflex to ID Panel     Status: None   Collection Time: 10/01/15 11:45 PM  Result Value Ref Range Status   Specimen Description BLOOD LEFT HAND  Final   Special Requests IN PEDIATRIC BOTTLE 4CC  Final   Culture NO GROWTH 5 DAYS  Final   Report Status 10/07/2015 FINAL  Final  Urine culture     Status: Abnormal   Collection Time: 10/01/15 11:57 PM  Result Value Ref  Range Status   Specimen Description URINE, RANDOM  Final   Special Requests NONE  Final   Culture >=100,000 COLONIES/mL YEAST (A)  Final   Report  Status 10/03/2015 FINAL  Final  MRSA PCR Screening     Status: Abnormal   Collection Time: 10/02/15  9:40 AM  Result Value Ref Range Status   MRSA by PCR POSITIVE (A) NEGATIVE Final    Comment:        The GeneXpert MRSA Assay (FDA approved for NASAL specimens only), is one component of a comprehensive MRSA colonization surveillance program. It is not intended to diagnose MRSA infection nor to guide or monitor treatment for MRSA infections. RESULT CALLED TO, READ BACK BY AND VERIFIED WITH: Bernarda Caffey RN 11:45 10/02/15 ([email or web address])   Aerobic/Anaerobic Culture (surgical/deep wound)     Status: None   Collection Time: 10/03/15  5:00 PM  Result Value Ref Range Status   Specimen Description ABSCESS BACK  Final   Special Requests NONE  Final   Gram Stain   Final    FEW WBC PRESENT,BOTH PMN AND MONONUCLEAR NO ORGANISMS SEEN    Culture No growth aerobically or anaerobically.  Final   Report Status 10/08/2015 FINAL  Final         Radiology Studies: No results found.      Scheduled Meds: . amiodarone  200 mg Oral Daily  . antiseptic oral rinse  7 mL Mouth Rinse q12n4p  . apixaban  5 mg Oral BID  . chlorhexidine  15 mL Mouth Rinse BID  . cycloSPORINE  1 drop Both Eyes BID  . feeding supplement (GLUCERNA SHAKE)  237 mL Oral TID BM  . finasteride  5 mg Oral QHS  . fluconazole (DIFLUCAN) IV  200 mg Intravenous Once  . insulin aspart  0-15 Units Subcutaneous TID WC  . metoprolol tartrate  50 mg Oral BID  . mometasone-formoterol  2 puff Inhalation BID  . pilocarpine  1 drop Both Eyes TID  . potassium chloride  40 mEq Oral Once  . predniSONE  7 mg Oral Q breakfast  . sodium chloride flush  3 mL Intravenous Q12H  . tamsulosin  0.4 mg Oral QHS   Continuous Infusions: . sodium chloride 50 mL/hr at 10/09/15 0030     LOS: 7 days    Time spent: 30 minutes. More than 50% time spent on updating and explaining plan of care to family     Brenn Gatton, MD PhD [facility] Pager [phone]  If 7PM-7AM, please contact night-coverage [email or web address] Password [facility] 10/09/2015, 11:48 AM

## 2015-10-09 NOTE — Progress Notes (Signed)
Subjective:   He is currently back in sinus rhythm.  He is lying in the bed today.  Appears less depressed.  No shortness of breath or chest pain.  Is confused and does not know that he is in the hospital.  Objective:  Vital Signs in the last 24 hours: BP (!) 160/77   Pulse 88   Temp 98.1 F (36.7 C) (Oral)   Resp 18   Ht 5\' 11"  (1.803 m)   Wt 89 kg (196 lb 4.8 oz)   SpO2 98%   BMI 27.38 kg/m   Physical Exam: Elderly male sitting at bedside with physical therapy in no acute distress  Lungs:  Clear Cardiac:  irregular rhythm, normal S1 and S2, no S3 Abdomen:  Soft, nontender, no masses Extremities: Trace edema present  Intake/Output from previous day: 07/25 0701 - 07/26 0700 In: 590 [P.O.:240; I.V.:350] Out: 400 [Urine:400]  Weight Filed Weights   10/06/15 0449 10/07/15 0426 10/07/15 1629  Weight: 87.7 kg (193 lb 5.5 oz) 88.3 kg (194 lb 10.7 oz) 89 kg (196 lb 4.8 oz)    Lab Results: Basic Metabolic Panel:  Recent Labs  10/08/15 0534 10/09/15 0631  NA 143 144  K 3.6 3.1*  CL 110 107  CO2 22 24  GLUCOSE 109* 117*  BUN 11 10  CREATININE 1.89* 1.70*   CBC:  Recent Labs  10/07/15 0528 10/08/15 0534  WBC 5.0 5.4  HGB 9.0* 10.2*  HCT 28.2* 32.1*  MCV 90.1 92.2  PLT 259 265   Telemetry: Normal sinus rhythm with occasional PVCs  Assessment/Plan:  1.  Paroxysmal atrial fibrillation -Now back in sinus rhythm.  Amiodarone reduced yesterday.   2.  Coronary artery disease with previous bypass grafting 3.  Elevated troponin suggestive of demand ischemia likely due to rapid atrial fibrillation and sepsis-no additional evaluation needed 4.  Hypertensive heart disease  Recommendations:  Currently on anticoagulation and is back in sinus rhythm on amiodarone 200 mg.  I would continue this.  If nausea is an issue might consider either reducing this further or stopping it.  Metoprolol was increased yesterday.  Kerry Hough  MD  [facility] Cardiology  10/09/2015, 8:47 AM

## 2015-10-10 ENCOUNTER — Other Ambulatory Visit: Payer: Self-pay

## 2015-10-10 ENCOUNTER — Inpatient Hospital Stay (HOSPITAL_COMMUNITY): Payer: PPO

## 2015-10-10 DIAGNOSIS — N189 Chronic kidney disease, unspecified: Secondary | ICD-10-CM

## 2015-10-10 DIAGNOSIS — F411 Generalized anxiety disorder: Secondary | ICD-10-CM

## 2015-10-10 LAB — COMPREHENSIVE METABOLIC PANEL
ALT: 23 U/L (ref 17–63)
AST: 29 U/L (ref 15–41)
Albumin: 2.3 g/dL — ABNORMAL LOW (ref 3.5–5.0)
Alkaline Phosphatase: 132 U/L — ABNORMAL HIGH (ref 38–126)
Anion gap: 11 (ref 5–15)
BUN: 8 mg/dL (ref 6–20)
CO2: 24 mmol/L (ref 22–32)
Calcium: 8.4 mg/dL — ABNORMAL LOW (ref 8.9–10.3)
Chloride: 108 mmol/L (ref 101–111)
Creatinine, Ser: 1.64 mg/dL — ABNORMAL HIGH (ref 0.61–1.24)
GFR calc Af Amer: 46 mL/min — ABNORMAL LOW (ref >60)
GFR calc non Af Amer: 39 mL/min — ABNORMAL LOW (ref >60)
Glucose, Bld: 117 mg/dL — ABNORMAL HIGH (ref 65–99)
Potassium: 3.5 mmol/L (ref 3.5–5.1)
Sodium: 143 mmol/L (ref 135–145)
Total Bilirubin: 1.6 mg/dL — ABNORMAL HIGH (ref 0.3–1.2)
Total Protein: 5.1 g/dL — ABNORMAL LOW (ref 6.5–8.1)

## 2015-10-10 LAB — MAGNESIUM: Magnesium: 1.4 mg/dL — ABNORMAL LOW (ref 1.7–2.4)

## 2015-10-10 LAB — GLUCOSE, CAPILLARY
Glucose-Capillary: 112 mg/dL — ABNORMAL HIGH (ref 65–99)
Glucose-Capillary: 112 mg/dL — ABNORMAL HIGH (ref 65–99)
Glucose-Capillary: 120 mg/dL — ABNORMAL HIGH (ref 65–99)
Glucose-Capillary: 156 mg/dL — ABNORMAL HIGH (ref 65–99)
Glucose-Capillary: 139 mg/dL — ABNORMAL HIGH (ref 65–99)
Glucose-Capillary: 199 mg/dL — ABNORMAL HIGH (ref 65–99)

## 2015-10-10 MED ORDER — MAGNESIUM SULFATE 4 GM/100ML IV SOLN
4.0000 g | Freq: Once | INTRAVENOUS | Status: AC
  Administered 2015-10-10: 4 g via INTRAVENOUS
  Filled 2015-10-10: qty 100

## 2015-10-10 MED ORDER — MEGESTROL ACETATE 400 MG/10ML PO SUSP
200.0000 mg | Freq: Every day | ORAL | Status: DC
  Administered 2015-10-10 – 2015-10-14 (×5): 200 mg via ORAL
  Filled 2015-10-10 (×5): qty 5

## 2015-10-10 MED ORDER — FLUCONAZOLE IN SODIUM CHLORIDE 100-0.9 MG/50ML-% IV SOLN
100.0000 mg | INTRAVENOUS | Status: AC
  Administered 2015-10-10 – 2015-10-13 (×4): 100 mg via INTRAVENOUS
  Filled 2015-10-10 (×6): qty 50

## 2015-10-10 NOTE — Progress Notes (Signed)
   10/10/15 1300  Adult Fall Risk Assessment  Risk Factor Category (scoring not indicated) High fall risk per protocol (document High fall risk)  Age 75  Fall History: Fall within 6 months prior to admission 0  Elimination; Bowel and/or Urine Incontinence 2  Elimination; Bowel and/or Urine Urgency/Frequency 0  Medications: includes PCA/Opiates, Anti-convulsants, Anti-hypertensives, Diuretics, Hypnotics, Laxatives, Sedatives, and Psychotropics 0  Patient Care Equipment 2  Mobility-Assistance 2  Mobility-Gait 2  Mobility-Sensory Deficit 0  Altered awareness of immediate physical environment 1  Impulsiveness 2  Lack of understanding of one's physical/cognitive limitations 4  Total Score 17  Patient's Fall Risk High Fall Risk (>13 points)  Screening for Fall Injury Risk  Risk For Fall Injury- See Row Information  Nurse judgement

## 2015-10-10 NOTE — Progress Notes (Signed)
Physical Therapy Treatment Patient Details Name: KHALEL ELIOPOULOS MRN: [name] DOB: Jan 29, 1941 Today's Date: 10/10/2015    History of Present Illness Pt is a 75 y/o M from [facility] facility who presented with AMS.  Pt was recently hospitalized with discitis. MRI spine on admission showed fluid collection and fluid collection done 7/20. Admitting dx: sepsis of unclear source. Pt's PMH includes polymyalgia rheumatica, COPD, anemia, anxiety, hepatitis, CABG, lumbar laminectomy/decompression microdiscectomy 09/06/15.      PT Comments    Per chart, pt had a fall with nursing earlier today. Spoke with RN who was okay with PT working with pt. Pt initially complaned about not getting out of bed. While assisting pt to EOB, he c/o "dizziness"/"spinning". Sat EOB for several minutes before pt requested to return to bed. Upon pt returning to supine, therapist observed L eye nystagmus. Pt then c/o chest pain. Made RN aware. Feel pt may need  vestibular evaluation/tx. Continue to recommend SNF at discharge.    Follow Up Recommendations  SNF;Supervision/Assistance - 24 hour     Equipment Recommendations   (TBD next venue)    Recommendations for Other Services       Precautions / Restrictions Precautions Precautions: Fall Precaution Comments: pt experiencing "dizziness/spinning" and nystagmus noted as well Restrictions Weight Bearing Restrictions: No    Mobility  Bed Mobility Overal bed mobility: Needs Assistance Bed Mobility: Supine to Sit;Sit to Supine     Supine to sit: Mod assist Sit to supine: Mod assist   General bed mobility comments: Assist for trunk and LEs onto bed. Increased time. Utilized bedpad for scooting, positioning. Observed nystagmus upon pt returning to supine  Transfers                 General transfer comment: NT-pt unable due to dizziness.   Ambulation/Gait                 Stairs            Wheelchair Mobility    Modified Rankin  (Stroke Patients Only)       Balance Overall balance assessment: Needs assistance   Sitting balance-Leahy Scale: Poor Sitting balance - Comments: Assist to stabilize due to dizziness                            Cognition Arousal/Alertness: Awake/alert Behavior During Therapy: WFL for tasks assessed/performed Overall Cognitive Status: No family/caregiver present to determine baseline cognitive functioning       Memory: Decreased short-term memory              Exercises      General Comments        Pertinent Vitals/Pain Pain Assessment: Faces Faces Pain Scale: Hurts little more Pain Location: c/o chest pain once settled back in bed-RN made aware Pain Descriptors / Indicators: Moaning Pain Intervention(s): Repositioned;Limited activity within patient's tolerance;Monitored during session    Home Living                      Prior Function            PT Goals (current goals can now be found in the care plan section) Progress towards PT goals: Not progressing toward goals - comment (limited by dizziness today)    Frequency  Min 2X/week    PT Plan Current plan remains appropriate    Co-evaluation  End of Session   Activity Tolerance:  (Limited by dizziness) Patient left: in bed;with call bell/phone within reach;with bed alarm set     Time: [id number] PT Time Calculation (min) (ACUTE ONLY): 13 min  Charges:  $Therapeutic Activity: 8-22 mins                    G Codes:      Weston Anna, MPT Pager: [phone]

## 2015-10-10 NOTE — Progress Notes (Signed)
Notified patient wife via telephone that patient had an assisted fall with NT, and was not injured and vital signs where stable. Spoke to [employer].

## 2015-10-10 NOTE — Research (Signed)
Apixaban Validation Study (ClinicalTrials.gov Identifier: [id number]) RESEARCH SUBJECT. Purpose: Obtain fresh samples that will be used to assess the performances of STA-Apixaban Calibrator and STA-Apixaban Control in combination with the STA-Liquid Anti-Xa to determine the quantity of apixaban in plasma samples by measurement of its direct anti-Xa activity. Apixaban Validation Study is sponsored by [employer].The fresh samples will collected by completing a one time blood draw on approved patients.   Inclusion Criteria: Must meet AT LEAST one of the following:  weight </= 60kg or >/= 75 years or Hct <39% for male or < 36% for male or renal impairment or co-medication with ASA/NSAIDs, or co-medication with anti-platelet agents.  Apixaban Validation Study Informed Consent   Subject Name: Jacob [name]  This patient, [name], has been consented to the above clinical trial according to FDA regulations, GCP guidelines and [employer]'s SOPs. Because the mental status and emotional capacity of this patient was not adequate to give an informed consent at this time, the informed consent form and study design have been explained to this patient's legally authorized representative, Lala Lund, by this Study Coordinator at 11:30 AM on 10/09/2015. The surrogate demonstrated comprehension of the trial and study requirements/expectations. No study procedures have been initiated before consenting of this patient/surrogate. This surrogate was given sufficient time for reading the consent form. All risks, benefits and options have been thoroughly discussed and all questions were answered per the surrogate's satisfaction. This patient/surrogate was not coerced in any way to participate in this clinical trial. The surrogate has voluntarily signed consent version 2.0 at 10:08 AM on 10/10/2015. A copy of the signed consent form was given to the surrogate and a copy was placed in the subject's medical  record. This surrogate was thanked for this patient's participation in research and contribution to science.  Dennison Mascot, RN Clinical Research Nurse  [place], [facility] Office: [phone]

## 2015-10-10 NOTE — Progress Notes (Signed)
PROGRESS NOTE    Jacob [name]  [id number] DOB: 06-20-1940 DOA: 10/01/2015 PCP: No primary care provider on file.    Brief Narrative: Jacob [name] is a 75 y.o. male with medical history significant of CAD, CABG, IDDM, COPD, dyslipidemia, RCC, PMR, HTN, CKD, OSA , lumbar stenosis s/p correction, and recently was hospitalized with discitis,  was on rocephin and vancomycin till 7/13, presents for altered mental status. On arrival to ED, he was found to be febrile, tachycardic, hypotensive, in a fib with RVR, . He was admitted to step down for further management.  Cardiology and PCCM consulted.  PCCM signed off recommending a repeat MRI of the lumbar spine. MRI spine showed fluid collection , suspect abscess, NS and IR consulted for further eval.  CT guided aspiration of the fluid collection done on 7/20 and cultures are pending. No organisms seen.  ID on board to help [name] with antibiotics.  PT repeat eval requested to see if he is eligible for inpatient rehab.  Assessment & Plan:   Principal Problem:   Sepsis ([place]) Active Problems:   Diabetes mellitus, type 2 ([place])   CHF with left ventricular diastolic dysfunction, NYHA class 2 (HCC)   Acute encephalopathy   Elevated troponin   Acute kidney injury superimposed on chronic kidney disease (HCC)   Atrial fibrillation with RVR (H[facility])   Hypotension   Abscess   AKI (acute kidney injury) ([place])  Sepsis of unclear source possibly from health care associated pneumonia vs recurrent discitis.  CXR shows left lobe air space consolidation.  MRI of the lumbar spine ordered to further evaluate the discitis.  It showed 1.4 x 2.5 x 3.3 cm rim enhancing paraspinal fluid collection concerning for abscess. 7 x 15 mm fluid collection in RIGHT laminectomy defect equivocal for abscess, and could represent seroma or possibly hematoma. IR consulted and he underwent CT guided aspiration of the fluid collection , the aspirate is negative for organisms, few wbc  seen and cultures have been negative so far. Requested neuro surgery consult in view of the abnormal MRI, suggested probably not an abscess as the cultures have been negative.  Of note he just completed 4 weeks of vancomycin and rocephin on 7/13.  He was started on IV vancomycin and IV zosyn, IV azithromycin on admission , later transitioned to zosyn alone, which was discontinued on 7/24 as all the cultures have been negative and a repeat CXR did not show any consolidation. monitor off antibiotics Lactic acid is normalized,  Blood cultures negative so far.  He is afebrile and repeat labs to check WBC count, show normal wbc count.    Acute vs subacute encephalopathy with bouts of agitation, labile mood, crying at times, anxiety/depression: EEG is sub optimal study as pt would not stay still , but it  Showed diffuse cerebral dysfunction, non specific in nature. , no epileptiform discharges seen.  MRI was ordered, but patient could not tolerate it even with ativan. We discontinued the test after discussing with the family.  Last mri brain from 08/29/2015 no acute findings After talking to the families, these symptoms has been wax and wean in the last few months, tsh/b12/rpr/hiv negative, he was seen by neurology for this on 7/17 who recommended minimizing sedative meds and referred to have psych evaluation,  Psych consulted    PAF/Atrial fibrillation with RVR:  Serial troponins are elevated and peaked at 0.9 , cardiology Dr Wynonia Lawman consulted Currently on amiodarone and beta blocker, Started the patient on Eliquis  Converted to NSR   Acute kidney injury on STAGE 3 CKD with solitary kidney (h/o R renal cell carcinoma s/p resection in 07/2015); Possibly from sepsis, dehydration.   US renal does not show any hydronephrosis.   Pt is incontinent and urine output couldn't be measure appropriately.  UA shows SMALL LEUKOCYTOSIS with few bacteria and wbc.  Urine cultures done , show yeast, started diflucan  due to underline immunosuppression Cr improving   IDDM: a1c 6.8 in 08/2015 SLP  EVAL recommended restarting  Dysphagia 3 diet.  CBG (last 3)   Recent Labs  10/10/15 0141 10/10/15 0617 10/10/15 0812  GLUCAP 112* 112* 120*  resume SSI.    Nutrition: Carb modified diet with supplementations.   Hypernatremia: Probably from dehydration and Poor po intake.  Resolved after fluid hydration.    Leukopenia,  Unclear etiology. Probably from IV antibiotics or sepsis.  Resolved.  Anemia: Baseline hemoglobin between 9 to 10,  Stable. Monitor.   COPD: No wheezing heard, resume Symbicort and duo nebs as needed.   PMR:  On  Maintenance dose of prednisone, continue the same.      Hypokalemia/hypomagnesemia: , replace prn to keep k>4, mag >2    Hypertension: suboptimal.  Add prn hydralazine.   Poor appetite, Difficulty swallowing vs patient does not want to swallow, will get dg esophagus, trial of megace  FTT; patient had multiple hospitalization with multiple surgeries and multiple infections in the last few months, has not been getting significant improvement. Will need snf  DVT prophylaxis: sq heparin. Code Status: do not intubate only. Family Communication: talked to daughter and wife over the phone o n 7/26 , talked to wife and daughter in person on 7/27 Disposition Plan: need snf at discharge,  Family reports not happy with the care at previous SNF    Consultants:   Cardiology Dr Wynonia Lawman.  PCCM  ID Dr Linus Salmons  IR  Neuro surgery. Dr Parks Neptune.  Procedures:  MRI LUMBAR SPINE.  EEG.  Antimicrobials:  Vancomycin 7/19 to 7/20 Zosyn 7/19- 7/24  Azithromycin 7/19  Subjective: Drowsy, falling back to sleep during conversation,  Has episodes of agitation and crying spells Per family patient is making more meaning full conversation today, but he still not wanting to eat or drink, he gags when eat or drinks per family  Objective: Vitals:   10/09/15 1929  10/09/15 2104 10/10/15 0528 10/10/15 0831  BP:  (!) 164/68 (!) 168/75   Pulse:  80 79   Resp:  18 18   Temp:  98.1 F (36.7 C) 97.5 F (36.4 C)   TempSrc:  Oral Oral   SpO2: 99% 100% 100% 99%  Weight:      Height:        Intake/Output Summary (Last 24 hours) at 10/10/15 0841 Last data filed at 10/09/15 1300  Gross per 24 hour  Intake                0 ml  Output              390 ml  Net             -390 ml   Filed Weights   10/06/15 0449 10/07/15 0426 10/07/15 1629  Weight: 87.7 kg (193 lb 5.5 oz) 88.3 kg (194 lb 10.7 oz) 89 kg (196 lb 4.8 oz)    Examination:  General exam: crying spells, NAD in between Respiratory system: diminished at bases, no wheezing or rhonchi.  Cardiovascular system: regular , s1s2,  rate controlled.  Gastrointestinal system: Abdomen is nondistended, soft and nontender. No organomegaly or masses felt. Normal bowel sounds heard. Central nervous system: alert, oriented to person and place, not to time, intermittent agitation, crying spells, seems sleepy, drift back to sleep during conversation.  Skin: No rashes, lesions or ulcers     Data Reviewed: I have personally reviewed following labs and imaging studies  CBC:  Recent Labs Lab 10/04/15 0101 10/05/15 1520 10/07/15 0528 10/08/15 0534  WBC 1.9* 3.2* 5.0 5.4  HGB 10.1* 10.0* 9.0* 10.2*  HCT 31.9* 32.0* 28.2* 32.1*  MCV 91.7 91.4 90.1 92.2  PLT 257 261 259 [id number]   Basic Metabolic Panel:  Recent Labs Lab 10/06/15 0603 10/07/15 0528 10/08/15 0534 10/09/15 0631 10/10/15 0504  NA 147* 144 143 144 143  K 3.1* 3.0* 3.6 3.1* 3.5  CL 115* 110 110 107 108  CO2 24 26 22 24 24   GLUCOSE 76 121* 109* 117* 117*  BUN 16 13 11 10 8   CREATININE 1.85* 1.85* 1.89* 1.70* 1.64*  CALCIUM 8.7* 8.3* 8.3* 8.6* 8.4*  MG  --  1.4*  --   --  1.4*   GFR: Estimated Creatinine Clearance: 41.5 mL/min (by C-G formula based on SCr of 1.64 mg/dL). Liver Function Tests:  Recent Labs Lab 10/04/15 0101  10/10/15 0504  AST 13* 29  ALT 14* 23  ALKPHOS 98 132*  BILITOT 1.1 1.6*  PROT 5.1* 5.1*  ALBUMIN 2.3* 2.3*   No results for input(s): LIPASE, AMYLASE in the last 168 hours. No results for input(s): AMMONIA in the last 168 hours. Coagulation Profile:  Recent Labs Lab 10/03/15 1925  INR 1.43   Cardiac Enzymes:  Recent Labs Lab 10/03/15 1022 10/03/15 1925 10/04/15 0101  TROPONINI 0.64* 0.57* 0.51*   BNP (last 3 results) No results for input(s): PROBNP in the last 8760 hours. HbA1C: No results for input(s): HGBA1C in the last 72 hours. CBG:  Recent Labs Lab 10/09/15 1624 10/09/15 2041 10/10/15 0141 10/10/15 0617 10/10/15 0812  GLUCAP 212* 73 112* 112* 120*   Lipid Profile: No results for input(s): CHOL, HDL, LDLCALC, TRIG, CHOLHDL, LDLDIRECT in the last 72 hours. Thyroid Function Tests: No results for input(s): TSH, T4TOTAL, FREET4, T3FREE, THYROIDAB in the last 72 hours. Anemia Panel: No results for input(s): VITAMINB12, FOLATE, FERRITIN, TIBC, IRON, RETICCTPCT in the last 72 hours. Sepsis Labs: No results for input(s): PROCALCITON, LATICACIDVEN in the last 168 hours.  Recent Results (from the past 240 hour(s))  Culture, blood (Routine X 2) w Reflex to ID Panel     Status: None   Collection Time: 10/01/15 11:40 PM  Result Value Ref Range Status   Specimen Description BLOOD LEFT ARM  Final   Special Requests   Final    BOTTLES DRAWN AEROBIC AND ANAEROBIC 10CC PT ON VANC ZOSYN   Culture NO GROWTH 5 DAYS  Final   Report Status 10/07/2015 FINAL  Final  Culture, blood (Routine X 2) w Reflex to ID Panel     Status: None   Collection Time: 10/01/15 11:45 PM  Result Value Ref Range Status   Specimen Description BLOOD LEFT HAND  Final   Special Requests IN PEDIATRIC BOTTLE 4CC  Final   Culture NO GROWTH 5 DAYS  Final   Report Status 10/07/2015 FINAL  Final  Urine culture     Status: Abnormal   Collection Time: 10/01/15 11:57 PM  Result Value Ref Range Status     Specimen Description URINE, RANDOM  Final   Special Requests NONE  Final   Culture >=100,000 COLONIES/mL YEAST (A)  Final   Report Status 10/03/2015 FINAL  Final  MRSA PCR Screening     Status: Abnormal   Collection Time: 10/02/15  9:40 AM  Result Value Ref Range Status   MRSA by PCR POSITIVE (A) NEGATIVE Final    Comment:        The GeneXpert MRSA Assay (FDA approved for NASAL specimens only), is one component of a comprehensive MRSA colonization surveillance program. It is not intended to diagnose MRSA infection nor to guide or monitor treatment for MRSA infections. RESULT CALLED TO, READ BACK BY AND VERIFIED WITH: Bernarda Caffey RN 11:45 10/02/15 ([email or web address])   Aerobic/Anaerobic Culture (surgical/deep wound)     Status: None   Collection Time: 10/03/15  5:00 PM  Result Value Ref Range Status   Specimen Description ABSCESS BACK  Final   Special Requests NONE  Final   Gram Stain   Final    FEW WBC PRESENT,BOTH PMN AND MONONUCLEAR NO ORGANISMS SEEN    Culture No growth aerobically or anaerobically.  Final   Report Status 10/08/2015 FINAL  Final         Radiology Studies: No results found.      Scheduled Meds: . amiodarone  200 mg Oral Daily  . antiseptic oral rinse  7 mL Mouth Rinse q12n4p  . apixaban  5 mg Oral BID  . chlorhexidine  15 mL Mouth Rinse BID  . cycloSPORINE  1 drop Both Eyes BID  . feeding supplement (GLUCERNA SHAKE)  237 mL Oral TID BM  . finasteride  5 mg Oral QHS  . insulin aspart  0-15 Units Subcutaneous TID WC  . magnesium sulfate 1 - 4 g bolus IVPB  4 g Intravenous Once  . metoprolol tartrate  50 mg Oral BID  . mometasone-formoterol  2 puff Inhalation BID  . pilocarpine  1 drop Both Eyes TID  . predniSONE  7 mg Oral Q breakfast  . sodium chloride flush  3 mL Intravenous Q12H  . tamsulosin  0.4 mg Oral QHS   Continuous Infusions: . sodium chloride 50 mL/hr at 10/09/15 1240     LOS: 8 days    Time spent: 30 minutes. More than 50% time  spent on updating and explaining plan of care to family     Catrice Zuleta, MD PhD [facility] Pager [phone]  If 7PM-7AM, please contact night-coverage [email or web address] Password [facility] 10/10/2015, 8:41 AM

## 2015-10-11 DIAGNOSIS — Z794 Long term (current) use of insulin: Secondary | ICD-10-CM

## 2015-10-11 DIAGNOSIS — R627 Adult failure to thrive: Secondary | ICD-10-CM

## 2015-10-11 DIAGNOSIS — E119 Type 2 diabetes mellitus without complications: Secondary | ICD-10-CM

## 2015-10-11 DIAGNOSIS — F419 Anxiety disorder, unspecified: Secondary | ICD-10-CM

## 2015-10-11 DIAGNOSIS — F09 Unspecified mental disorder due to known physiological condition: Secondary | ICD-10-CM

## 2015-10-11 DIAGNOSIS — B377 Candidal sepsis: Secondary | ICD-10-CM

## 2015-10-11 LAB — COMPREHENSIVE METABOLIC PANEL
ALT: 20 U/L (ref 17–63)
AST: 37 U/L (ref 15–41)
Albumin: 2.5 g/dL — ABNORMAL LOW (ref 3.5–5.0)
Alkaline Phosphatase: 139 U/L — ABNORMAL HIGH (ref 38–126)
Anion gap: 12 (ref 5–15)
BUN: 10 mg/dL (ref 6–20)
CO2: 23 mmol/L (ref 22–32)
Calcium: 8.6 mg/dL — ABNORMAL LOW (ref 8.9–10.3)
Chloride: 106 mmol/L (ref 101–111)
Creatinine, Ser: 1.53 mg/dL — ABNORMAL HIGH (ref 0.61–1.24)
GFR calc Af Amer: 50 mL/min — ABNORMAL LOW (ref >60)
GFR calc non Af Amer: 43 mL/min — ABNORMAL LOW (ref >60)
Glucose, Bld: 166 mg/dL — ABNORMAL HIGH (ref 65–99)
Potassium: 3.7 mmol/L (ref 3.5–5.1)
Sodium: 141 mmol/L (ref 135–145)
Total Bilirubin: 1.9 mg/dL — ABNORMAL HIGH (ref 0.3–1.2)
Total Protein: 5.6 g/dL — ABNORMAL LOW (ref 6.5–8.1)

## 2015-10-11 LAB — MAGNESIUM: Magnesium: 2.2 mg/dL (ref 1.7–2.4)

## 2015-10-11 LAB — CBC
HCT: 41.2 % (ref 39.0–52.0)
Hemoglobin: 10.2 g/dL — ABNORMAL LOW (ref 13.0–17.0)
MCH: 26.2 pg (ref 26.0–34.0)
MCHC: 24.8 g/dL — ABNORMAL LOW (ref 30.0–36.0)
MCV: 105.9 fL — ABNORMAL HIGH (ref 78.0–100.0)
Platelets: 233 10*3/uL (ref 150–400)
RBC: 3.89 MIL/uL — ABNORMAL LOW (ref 4.22–5.81)
RDW: 18.5 % — ABNORMAL HIGH (ref 11.5–15.5)
WBC: 6.4 10*3/uL (ref 4.0–10.5)

## 2015-10-11 LAB — GLUCOSE, CAPILLARY
Glucose-Capillary: 158 mg/dL — ABNORMAL HIGH (ref 65–99)
Glucose-Capillary: 239 mg/dL — ABNORMAL HIGH (ref 65–99)
Glucose-Capillary: 311 mg/dL — ABNORMAL HIGH (ref 65–99)
Glucose-Capillary: 83 mg/dL (ref 65–99)

## 2015-10-11 MED ORDER — HYDROXYZINE HCL 25 MG PO TABS
25.0000 mg | ORAL_TABLET | Freq: Three times a day (TID) | ORAL | Status: DC
  Administered 2015-10-11 – 2015-10-14 (×8): 25 mg via ORAL
  Filled 2015-10-11 (×9): qty 1

## 2015-10-11 MED ORDER — TRAZODONE HCL 100 MG PO TABS
100.0000 mg | ORAL_TABLET | Freq: Every day | ORAL | Status: DC
  Administered 2015-10-11 – 2015-10-13 (×3): 100 mg via ORAL
  Filled 2015-10-11 (×3): qty 1

## 2015-10-11 MED ORDER — INSULIN ASPART 100 UNIT/ML ~~LOC~~ SOLN
0.0000 [IU] | Freq: Three times a day (TID) | SUBCUTANEOUS | Status: DC
  Administered 2015-10-11: 11 [IU] via SUBCUTANEOUS
  Administered 2015-10-11: 3 [IU] via SUBCUTANEOUS
  Administered 2015-10-11 – 2015-10-12 (×2): 5 [IU] via SUBCUTANEOUS
  Administered 2015-10-12: 2 [IU] via SUBCUTANEOUS
  Administered 2015-10-13: 15 [IU] via SUBCUTANEOUS
  Administered 2015-10-13: 3 [IU] via SUBCUTANEOUS
  Administered 2015-10-13: 5 [IU] via SUBCUTANEOUS
  Administered 2015-10-14 (×2): 3 [IU] via SUBCUTANEOUS

## 2015-10-11 NOTE — Consult Note (Signed)
[place] [facility]iat[facility] Consult   Reason for Consult:  AMS and anxiety Referring Physician:  Dr. Erlinda Hong Patient Identification: Jacob [name] MRN:  [id number] Principal Diagnosis: Sepsis [facility]) Diagnosis:   Patient Active Problem List   Diagnosis Date Noted  . Abscess [L02.91]   . AKI (acute kidney injury) ([place]) [N17.9]   . Sepsis ([place]) [A41.9] 10/02/2015  . Acute kidney injury superimposed on chronic kidney disease ([place]) [N17.9, N18.9] 10/02/2015  . Atrial fibrillation with RVR ([place]) [I48.91] 10/02/2015  . Hypotension [I95.9] 10/02/2015  . Anorexia [R63.0]   . Adjustment disorder with mixed emotional features [F43.23] 09/01/2015  . Acute encephalopathy [G93.40] 08/29/2015  . Elevated troponin [R79.89] 08/29/2015  . Renal cancer ([place]) [C64.9] 08/28/2015  . Spinal stenosis of lumbar region with radiculopathy [M48.06, M54.16] 08/07/2015  . History of COPD [Z87.09]   . OSA (obstructive sleep apnea) [G47.33] 07/27/2015  . HNP (herniated nucleus pulposus), lumbar [M51.26] 07/24/2015  . CHF with left ventricular diastolic dysfunction, NYHA class 2 ([place]) [I50.30] 01/28/2014  . PMR (polymyalgia rheumatica) (HCC) [M35.3] 09/09/2013  . Rheumatoid arthritis ([place]) [M06.9] 04/13/2012  . CAD (coronary artery disease) [I25.10] 12/19/2011  . Diabetes mellitus, type 2 ([place]) [E11.9] 12/19/2011  . Neuropathy ([place]) [G62.9] 12/19/2011    Total Time spent with patient: 45 minutes  Subjective:   Jacob [name] is a 75 y.o. male patient admitted with altered mental status  HPI:  Jacob [name] is a 75 y.o. male seen, chart reviewed for face-to-face psychiatric consultation and evaluation of increased irritability, depression, restlessness, confusion and he was admitted to the hospital with altered mental status from skilled nursing facility [facility]. Patient stated that he needs to get out of his bed put his clothes and shoe and need to walk around which makes him feel better. Patient is also reported  he has been working with physical therapist, 2 of them trying to transport him to change and he fell on the floor. Patient denies active suicidal ideation, homicidal ideation, intention or plans. Patient has no reported auditory/visual hallucinations, delusions or paranoia.   Medical history: Patient with medical history significant of CAD, CABG, IDDM, COPD, dyslipidemia, RCC, PMR, HTN, CKD, OSA , lumbar stenosis s/p correction, and recently was hospitalized with discitis for which a PICC line was placed for IV antibiotics of vancomycin that he received a [place] facility; who presented with complaints of being found acutely altered. Patient is unable to provide adequate history and most of history is obtained from the patient's family members who are present at bedside. Patient had chest completed his IV antibiotic course approximately 2 days ago. His granddaughter went to check on him this afternoon at the nursing facility and reported that he was more confused and uterosacral and had not E [place] drink anything in several days. Patient was noted to be in atrial fibrillation with a heart rate of up to 170 with fever documented as high as 104F. Occasional complaint of cramps in his leg and lower back pain. Not been any significant complaints of shortness of breath or chest pain.   ED Course: Upon admission to department patient was evaluated and seen to be febrile with a temperature of 102.5F, heart rates irregular in the 150s to 160s,  blood pressures as low as 83/67, and 2 saturations maintained on room air currently. Initial lab work revealed WBC 1.6, hemoglobin 10.1, platelets 218, sodium 149, potassium 2.9, chloride 114, CO2 26, BUN 32, creatinine 2.33, calcium 9.2, magnesium 1.7, BNP 1601.7, and troponin  of 2.03. Patient found to have signs of a left lower lobe pneumonia on chest x-ray and urinalysis was positive for yeast, small leukocytes, 6-30 WBCs, and few bacteria. Sepsis protocol was  initiated and the patient received the recommended fluid bolus and was started on antibiotics of vancomycin and Zosyn. He also received 20 mEq of potassium chloride while in the ED. Cardiology and PCCM evaluated the patient. He was to resume amiodarone drip and it was recommended that TRH admitted to step down.  Past Psychiatric History: Adjustment disorder with mixed emotions, patient has no history of acute psychiatric hospitalization.   Risk to Self: Is patient at risk for suicide?: No Risk to Others:   Prior Inpatient Therapy:   Prior Outpatient Therapy:    Past Medical History:  Past Medical History:  Diagnosis Date  . Allergy   . Anemia   . Anxiety   . Arthritis   . Asthma   . BPH (benign prostatic hyperplasia)   . Cancer of kidney ([place])   . Cataract   . Chronic kidney disease    chronic  kidney failure  kidney function at 42%  . COPD (chronic obstructive pulmonary disease) ([place])   . Coronary artery disease    CABG  7 bypasses  . Diabetes mellitus without complication ([place])   . Hepatitis    many years ago  . Hypertension   . Polymyalgia rheumatica (HCC)    maintained on Prednisone, Plaquenil. Followed by rhuematology every 4 months/[name].  . Shortness of breath dyspnea    with exertion  . Sleep apnea    CPAP   Trying to use    Past Surgical History:  Procedure Laterality Date  . APPENDECTOMY    . CATARACT EXTRACTION W/PHACO Right 11/28/2014   Procedure: CATARACT EXTRACTION PHACO AND INTRAOCULAR LENS PLACEMENT ([place]) RIGHT ;  Surgeon: Marylynn Pearson, MD;  Location: [place];  Service: Ophthalmology;  Laterality: Right;  . CORONARY ARTERY BYPASS GRAFT  03/17/1995   Wynonia Lawman; followed every six months.  Marland Kitchen HERNIA REPAIR    . LUMBAR LAMINECTOMY/DECOMPRESSION MICRODISCECTOMY N/A 07/30/2015   Procedure: LUMBAR LAMINECTOMY DISCECTOMY ;  Surgeon: Ashok Pall, MD;  Location: [place] NEURO ORS;  Service: Neurosurgery;  Laterality: N/A;  LUMBAR LAMINECTOMY DISCECTOMY   . LUMBAR  LAMINECTOMY/DECOMPRESSION MICRODISCECTOMY N/A 09/06/2015   Procedure: Redo L3/4 Disectomy;  Surgeon: Ashok Pall, MD;  Location: [place] NEURO ORS;  Service: Neurosurgery;  Laterality: N/A;  . PROSTATE SURGERY     TURP at [place].   Family History:  Family History  Problem Relation Age of Onset  . Hypertension Other    Family Psychiatric  History: Unknown family history of psychiatric illness Social History:  History  Alcohol Use No     History  Drug Use No    Social History   Social History  . Marital status: Married    Spouse name: N/A  . Number of children: N/A  . Years of education: N/A   Occupational History  . Driver    Social History Main Topics  . Smoking status: Former Smoker    Quit date: 03/16/1957  . Smokeless tobacco: Never Used  . Alcohol use No  . Drug use: No  . Sexual activity: Yes    Birth control/ protection: None   Other Topics Concern  . None   Social History Narrative   Marital status: Married x 55 years.      Children: 3 children; 5 grandchildren; 6 gg.      Lives: with wife.  Employment: retired; [employer].  Working every other week; 45 hours.      Tobacco:  Quit at age 89.      Alcohol:  None      Exercise: none      ADLs: indepedent with ADLs;  Uses cane and has a walker.  Drives.      Advanced Directives:  +Living Will.  DNR/DNI.  Wife is HCPOA.   Additional Social History:    Allergies:   Allergies  Allergen Reactions  . Ace Inhibitors Other (See Comments)    Probably nausea and vomiting per patient   . Actonel [Risedronate] Nausea And Vomiting  . Ciprocinonide [Fluocinolone] Other (See Comments)    Probably nausea and vomiting per patient  . Flunisolide Other (See Comments)    Probably nausea and vomiting per patient   . Metformin And Related Other (See Comments)    Probably nausea and vomiting per patient   . Sertraline Other (See Comments)    Probably nausea and vomiting per patient   . Sulindac  Other (See Comments)    Probably nausea and vomiting per patient   . Terazosin Other (See Comments)    Probably nausea and vomiting per patient     Labs:  Results for orders placed or performed during the hospital encounter of 10/01/15 (from the past 48 hour(s))  Glucose, capillary     Status: Abnormal   Collection Time: 10/09/15  4:24 PM  Result Value Ref Range   Glucose-Capillary 212 (H) 65 - 99 mg/dL  Glucose, capillary     Status: None   Collection Time: 10/09/15  8:41 PM  Result Value Ref Range   Glucose-Capillary 73 65 - 99 mg/dL  Glucose, capillary     Status: Abnormal   Collection Time: 10/10/15  1:41 AM  Result Value Ref Range   Glucose-Capillary 112 (H) 65 - 99 mg/dL  Comprehensive metabolic panel     Status: Abnormal   Collection Time: 10/10/15  5:04 AM  Result Value Ref Range   Sodium 143 135 - 145 mmol/L   Potassium 3.5 3.5 - 5.1 mmol/L   Chloride 108 101 - 111 mmol/L   CO2 24 22 - 32 mmol/L   Glucose, Bld 117 (H) 65 - 99 mg/dL   BUN 8 6 - 20 mg/dL   Creatinine, Ser 1.64 (H) 0.61 - 1.24 mg/dL   Calcium 8.4 (L) 8.9 - 10.3 mg/dL   Total Protein 5.1 (L) 6.5 - 8.1 g/dL   Albumin 2.3 (L) 3.5 - 5.0 g/dL   AST 29 15 - 41 U/L   ALT 23 17 - 63 U/L   Alkaline Phosphatase 132 (H) 38 - 126 U/L   Total Bilirubin 1.6 (H) 0.3 - 1.2 mg/dL   GFR calc non Af Amer 39 (L) >60 mL/min   GFR calc Af Amer 46 (L) >60 mL/min    Comment: (NOTE) The eGFR has been calculated using the CKD EPI equation. This calculation has not been validated in all clinical situations. eGFR's persistently <60 mL/min signify possible Chronic Kidney Disease.    Anion gap 11 5 - 15  Magnesium     Status: Abnormal   Collection Time: 10/10/15  5:04 AM  Result Value Ref Range   Magnesium 1.4 (L) 1.7 - 2.4 mg/dL  Glucose, capillary     Status: Abnormal   Collection Time: 10/10/15  6:17 AM  Result Value Ref Range   Glucose-Capillary 112 (H) 65 - 99 mg/dL  Glucose, capillary  Status: Abnormal    Collection Time: 10/10/15  8:12 AM  Result Value Ref Range   Glucose-Capillary 120 (H) 65 - 99 mg/dL  Glucose, capillary     Status: Abnormal   Collection Time: 10/10/15 12:41 PM  Result Value Ref Range   Glucose-Capillary 156 (H) 65 - 99 mg/dL  Glucose, capillary     Status: Abnormal   Collection Time: 10/10/15  6:34 PM  Result Value Ref Range   Glucose-Capillary 139 (H) 65 - 99 mg/dL  Glucose, capillary     Status: Abnormal   Collection Time: 10/10/15  9:14 PM  Result Value Ref Range   Glucose-Capillary 199 (H) 65 - 99 mg/dL  CBC     Status: Abnormal   Collection Time: 10/11/15  5:45 AM  Result Value Ref Range   WBC 6.4 4.0 - 10.5 K/uL   RBC 3.89 (L) 4.22 - 5.81 MIL/uL   Hemoglobin 10.2 (L) 13.0 - 17.0 g/dL   HCT 41.2 39.0 - 52.0 %   MCV 105.9 (H) 78.0 - 100.0 fL   MCH 26.2 26.0 - 34.0 pg   MCHC 24.8 (L) 30.0 - 36.0 g/dL   RDW 18.5 (H) 11.5 - 15.5 %   Platelets 233 150 - 400 K/uL  Comprehensive metabolic panel     Status: Abnormal   Collection Time: 10/11/15  5:45 AM  Result Value Ref Range   Sodium 141 135 - 145 mmol/L   Potassium 3.7 3.5 - 5.1 mmol/L    Comment: SPECIMEN HEMOLYZED. HEMOLYSIS MAY AFFECT INTEGRITY OF RESULTS.   Chloride 106 101 - 111 mmol/L   CO2 23 22 - 32 mmol/L   Glucose, Bld 166 (H) 65 - 99 mg/dL   BUN 10 6 - 20 mg/dL   Creatinine, Ser 1.53 (H) 0.61 - 1.24 mg/dL   Calcium 8.6 (L) 8.9 - 10.3 mg/dL   Total Protein 5.6 (L) 6.5 - 8.1 g/dL   Albumin 2.5 (L) 3.5 - 5.0 g/dL   AST 37 15 - 41 U/L   ALT 20 17 - 63 U/L   Alkaline Phosphatase 139 (H) 38 - 126 U/L   Total Bilirubin 1.9 (H) 0.3 - 1.2 mg/dL   GFR calc non Af Amer 43 (L) >60 mL/min   GFR calc Af Amer 50 (L) >60 mL/min    Comment: (NOTE) The eGFR has been calculated using the CKD EPI equation. This calculation has not been validated in all clinical situations. eGFR's persistently <60 mL/min signify possible Chronic Kidney Disease.    Anion gap 12 5 - 15  Magnesium     Status: None    Collection Time: 10/11/15  5:45 AM  Result Value Ref Range   Magnesium 2.2 1.7 - 2.4 mg/dL  Glucose, capillary     Status: Abnormal   Collection Time: 10/11/15  7:56 AM  Result Value Ref Range   Glucose-Capillary 158 (H) 65 - 99 mg/dL  Glucose, capillary     Status: Abnormal   Collection Time: 10/11/15 12:32 PM  Result Value Ref Range   Glucose-Capillary 239 (H) 65 - 99 mg/dL    Current Facility-Administered Medications  Medication Dose Route Frequency Provider Last Rate Last Dose  . 0.9 %  sodium chloride infusion   Intravenous Continuous Gardiner Barefoot, NP 50 mL/hr at 10/09/15 1240    . acetaminophen (TYLENOL) tablet 650 mg  650 mg Oral Q6H PRN Norval Morton, MD   650 mg at 10/05/15 1727   Or  . acetaminophen (TYLENOL) suppository  650 mg  650 mg Rectal Q6H PRN Norval Morton, MD      . albuterol (PROVENTIL) (2.5 MG/3ML) 0.083% nebulizer solution 2.5 mg  2.5 mg Nebulization Q2H PRN Norval Morton, MD   2.5 mg at 10/11/15 0158  . amiodarone (PACERONE) tablet 200 mg  200 mg Oral Daily Jacolyn Reedy, MD   200 mg at 10/11/15 0858  . antiseptic oral rinse (CPC / CETYLPYRIDINIUM CHLORIDE 0.05%) solution 7 mL  7 mL Mouth Rinse q12n4p Hosie Poisson, MD   7 mL at 10/11/15 1219  . apixaban (ELIQUIS) tablet 5 mg  5 mg Oral BID Hosie Poisson, MD   5 mg at 10/11/15 0858  . chlorhexidine (PERIDEX) 0.12 % solution 15 mL  15 mL Mouth Rinse BID Hosie Poisson, MD   15 mL at 10/11/15 0854  . cycloSPORINE (RESTASIS) 0.05 % ophthalmic emulsion 1 drop  1 drop Both Eyes BID Norval Morton, MD   1 drop at 10/11/15 0855  . feeding supplement (GLUCERNA SHAKE) (GLUCERNA SHAKE) liquid 237 mL  237 mL Oral TID BM Hosie Poisson, MD   237 mL at 10/11/15 0859  . finasteride (PROSCAR) tablet 5 mg  5 mg Oral QHS Norval Morton, MD   5 mg at 10/10/15 2131  . fluconazole (DIFLUCAN) IVPB 100 mg  100 mg Intravenous Q24H Florencia Reasons, MD   100 mg at 10/11/15 1220  . insulin aspart (novoLOG) injection 0-15 Units  0-15  Units Subcutaneous TID WC Florencia Reasons, MD   3 Units at 10/11/15 0857  . ipratropium (ATROVENT) nebulizer solution 0.5 mg  0.5 mg Nebulization Q2H PRN Norval Morton, MD      . megestrol (MEGACE) 400 MG/10ML suspension 200 mg  200 mg Oral Daily Florencia Reasons, MD   200 mg at 10/11/15 0856  . metoprolol (LOPRESSOR) tablet 50 mg  50 mg Oral BID Jacolyn Reedy, MD   50 mg at 10/11/15 0857  . mometasone-formoterol (DULERA) 200-5 MCG/ACT inhaler 2 puff  2 puff Inhalation BID Hosie Poisson, MD   2 puff at 10/10/15 2023  . ondansetron (ZOFRAN) tablet 4 mg  4 mg Oral Q6H PRN Norval Morton, MD   4 mg at 10/07/15 1339   Or  . ondansetron (ZOFRAN) injection 4 mg  4 mg Intravenous Q6H PRN Norval Morton, MD   4 mg at 10/09/15 2039  . pilocarpine (PILOCAR) 2 % ophthalmic solution 1 drop  1 drop Both Eyes TID Norval Morton, MD   1 drop at 10/11/15 0855  . predniSONE (DELTASONE) tablet 7 mg  7 mg Oral Q breakfast Norval Morton, MD   7 mg at 10/11/15 0857  . sodium chloride flush (NS) 0.9 % injection 3 mL  3 mL Intravenous Q12H Norval Morton, MD   3 mL at 10/11/15 0858  . tamsulosin (FLOMAX) capsule 0.4 mg  0.4 mg Oral QHS Norval Morton, MD   0.4 mg at 10/10/15 2131    Musculoskeletal: Strength & Muscle Tone: decreased Gait & Station: unable to stand Patient leans: N/A  Psychiatric Specialty Exam: Physical Exam as per history and physical   ROS restlessness, agitation, confusion and depression. Patient denied shortness of breath and chest pain  No Fever-chills, No Headache, No changes with Vision or hearing, reports vertigo No problems swallowing food or Liquids, No Chest pain, Cough or Shortness of Breath, No Abdominal pain, No Nausea or Vommitting, Bowel movements are regular, No Blood in stool or  Urine, No dysuria, No new skin rashes or bruises, No new joints pains-aches,  No new weakness, tingling, numbness in any extremity, No recent weight gain or loss, No polyuria, polydypsia or  polyphagia,   A full 10 point Review of Systems was done, except as stated above, all other Review of Systems were negative.  Blood pressure (!) 161/69, pulse 80, temperature 97.9 F (36.6 C), temperature source Oral, resp. rate 18, height 5' 11"  (1.803 m), weight 89 kg (196 lb 4.8 oz), SpO2 100 %.Body mass index is 27.38 kg/m.  General Appearance: Disheveled and Guarded  Eye Contact:  Good  Speech:  Clear and Coherent and Slow  Volume:  Decreased  Mood:  Anxious  Affect:  Appropriate, Congruent and Labile  Thought Process:  Coherent  Orientation:  Full (Time, Place, and Person)  Thought Content:  Rumination and Tangential  Suicidal Thoughts:  No  Homicidal Thoughts:  No  Memory:  Immediate;   Fair Recent;   Poor  Judgement:  Impaired  Insight:  Shallow  Psychomotor Activity:  Restlessness  Concentration:  Concentration: Fair and Attention Span: Poor  Recall:  Poor  Fund of Knowledge:  Fair  Language:  Good  Akathisia:  Negative  Handed:  Right  AIMS (if indicated):     Assets:  Communication Skills Desire for Improvement Financial Resources/Insurance Housing Leisure Time Social Support Transportation  ADL's:  Impaired  Cognition:  Impaired,  Moderate  Sleep:        Treatment Plan Summary:Patient has significant cognitive deficits and poor insight, judgment and impulse components. Patient has a slow deterioration of his cognitive functions and possible dehydration secondary to decreased oral intake while in the nursing home.  Daily contact with patient to assess and evaluate symptoms and progress in treatment and Medication management   Based on my evaluation today patient does not meet criteria for capacity to make his own medical decisions and living arrangements.  We'll start hydroxyzine 25 mg 3 times daily for anxiety and restlessness We'll continue trazodone 100 mg at bedtime for insomnia  Patient will be referred to the geriatric psychiatric outpatient  treatment for medication for cognitive functions when medically stable  Appreciate psychiatric consultation and we sign off as of today Please contact [phone] or [phone] if needs further assistance   Disposition: Patient does not meet criteria for psychiatric inpatient admission. Supportive therapy provided about ongoing stressors.  Ambrose Finland, MD 10/11/2015 12:51 PM

## 2015-10-11 NOTE — Care Management Important Message (Signed)
Important Message  Patient Details  Name: Jacob [name] MRN: [id number] Date of Birth: [birth year]   Medicare Important Message Given:  Yes    Carles Collet, RN 10/11/2015, 9:11 AM

## 2015-10-11 NOTE — Progress Notes (Signed)
PROGRESS NOTE    Jacob [name]  [id number] DOB: 1940-09-28 DOA: 10/01/2015 PCP: No primary care provider on file.    Brief Narrative: Jacob [name] is a 75 y.o. male with medical history significant of CAD, CABG, IDDM, COPD, dyslipidemia, RCC, PMR, HTN, CKD, OSA , lumbar stenosis s/p correction, and recently was hospitalized with discitis,  was on rocephin and vancomycin till 7/13, presents for altered mental status. On arrival to ED, he was found to be febrile, tachycardic, hypotensive, in a fib with RVR, . He was admitted to step down for further management.  Cardiology and PCCM consulted.  PCCM signed off recommending a repeat MRI of the lumbar spine. MRI spine showed fluid collection , suspect abscess, NS and IR consulted for further eval.  CT guided aspiration of the fluid collection done on 7/20 and cultures are pending. No organisms seen.  ID on board to help [name] with antibiotics.  PT repeat eval requested to see if he is eligible for inpatient rehab.  Assessment & Plan:   Principal Problem:   Sepsis ([place]) Active Problems:   Diabetes mellitus, type 2 ([place])   CHF with left ventricular diastolic dysfunction, NYHA class 2 ([facility])   Acute encephalopathy   Elevated troponin   Acute kidney injury superimposed on chronic kidney disease ([facility])   Atrial fibrillation with RVR ([facility])   Hypotension   Abscess   AKI (acute kidney injury) ([place])  Sepsis of unclear source possibly from health care associated pneumonia vs recurrent discitis.  CXR shows left lobe air space consolidation.  MRI of the lumbar spine ordered to further evaluate the discitis.  It showed 1.4 x 2.5 x 3.3 cm rim enhancing paraspinal fluid collection concerning for abscess. 7 x 15 mm fluid collection in RIGHT laminectomy defect equivocal for abscess, and could represent seroma or possibly hematoma. IR consulted and he underwent CT guided aspiration of the fluid collection , the aspirate is negative for organisms, few wbc  seen and cultures have been negative so far. Requested neuro surgery consult in view of the abnormal MRI, suggested probably not an abscess as the cultures have been negative.  Of note he just completed 4 weeks of vancomycin and rocephin on 7/13.  He was started on IV vancomycin and IV zosyn, IV azithromycin on admission , later transitioned to zosyn alone, which was discontinued on 7/24 as all the cultures have been negative and a repeat CXR did not show any consolidation. monitor off antibiotics Lactic acid is normalized,  Blood cultures negative so far.  He is afebrile and repeat labs to check WBC count, show normal wbc count.    Acute vs subacute encephalopathy with bouts of agitation, labile mood, crying at times, anxiety/depression: EEG is sub optimal study as pt would not stay still , but it  Showed diffuse cerebral dysfunction, non specific in nature. , no epileptiform discharges seen.  MRI was ordered, but patient could not tolerate it even with ativan. We discontinued the test after discussing with the family.  Last mri brain from 08/29/2015 no acute findings After talking to the families, these symptoms has been wax and wean in the last few months, ammonia/tsh/b12/rpr/hiv negative, he was seen by neurology for this on 7/17 who recommended minimizing sedative meds and referred to have psych evaluation,  Psych consulted on 7/28 who recommended trial of hydroxyzine for anxiety and trazodone for insomnia    PAF/Atrial fibrillation with RVR:  Serial troponins are elevated and peaked at 0.9 , cardiology Dr  Wynonia Lawman consulted Currently on amiodarone and beta blocker, Started the patient on Eliquis Converted to NSR   Acute kidney injury on STAGE 3 CKD with solitary kidney (h/o R renal cell carcinoma s/p resection in 07/2015); Possibly from sepsis, dehydration.   US renal does not show any hydronephrosis.   Pt is incontinent and urine output couldn't be measure appropriately.  UA shows SMALL  LEUKOCYTOSIS with few bacteria and wbc.  Urine cultures done , show yeast, started diflucan due to underline immunosuppression Cr improving   IDDM: a1c 6.8 in 08/2015 SLP  EVAL recommended restarting  Dysphagia 3 diet.  CBG (last 3)   Recent Labs  10/11/15 0756 10/11/15 1232 10/11/15 1553  GLUCAP 158* 239* 311*  resume SSI.    Nutrition: Carb modified diet with supplementations.   Hypernatremia: Probably from dehydration and Poor po intake.  Resolved after fluid hydration.    Leukopenia,  Unclear etiology. Probably from IV antibiotics or sepsis.  Resolved.  Anemia: Baseline hemoglobin between 9 to 10,  Stable. Monitor.   COPD: No wheezing heard, resume Symbicort and duo nebs as needed.   PMR:  On  Maintenance dose of prednisone, continue the same.      Hypokalemia/hypomagnesemia: , replace prn to keep k>4, mag >2    Hypertension: suboptimal.  Add prn hydralazine.   Poor appetite, Difficulty swallowing vs patient does not want to swallow,DG  Esophagus showed moderate to severe esophageal dysmotility, I have updated daughter and recommended patient to have  Smaller meals and multiple times a day, soft diet recommended trial of megace for appetite stimulation, nutrition supplement  FTT; patient had multiple hospitalization with multiple surgeries and multiple infections in the last few months, has not been getting significant improvement. Will need snf  DVT prophylaxis: sq heparin. Code Status: do not intubate only. Family Communication: talked to daughter and wife over the phone o n 7/26 , talked to wife and daughter in person on 7/27, talked to daughter over the phone on 7/28 Disposition Plan: need snf at discharge,  Family reports not happy with the care at previous SNF    Consultants:   Cardiology Dr Wynonia Lawman.  PCCM  ID Dr Linus Salmons  IR  Neuro surgery. Dr Parks Neptune.  Procedures:  MRI LUMBAR SPINE.  EEG.  Antimicrobials:  Vancomycin 7/19 to  7/20 Zosyn 7/19- 7/24  Azithromycin 7/19  Subjective: Patient is having a very good day today, he is very pleasant, denies any discomfort, states want to get out of bed and work with physical therapy, he is more alert and making meaningful conversation No crying spells, no agitation today  Objective: Vitals:   10/11/15 0201 10/11/15 0505 10/11/15 1259 10/11/15 1545  BP:  (!) 161/69  (!) 157/69  Pulse:  80  75  Resp:  18    Temp:  97.9 F (36.6 C)  98.1 F (36.7 C)  TempSrc:  Oral  Oral  SpO2: 97% 100% 96% 98%  Weight:      Height:        Intake/Output Summary (Last 24 hours) at 10/11/15 1759 Last data filed at 10/11/15 1100  Gross per 24 hour  Intake                0 ml  Output              200 ml  Net             -200 ml   Filed Weights   10/06/15 0449 10/07/15 0426  10/07/15 1629  Weight: 87.7 kg (193 lb 5.5 oz) 88.3 kg (194 lb 10.7 oz) 89 kg (196 lb 4.8 oz)    Examination:  General exam: more alert and interactive, very pleasant ,  NAD  Respiratory system: diminished at bases, no wheezing or rhonchi.  Cardiovascular system: regular , s1s2, rate controlled.  Gastrointestinal system: Abdomen is nondistended, soft and nontender. No organomegaly or masses felt. Normal bowel sounds heard. Central nervous system: more alert, oriented to person and place, know it is 2017 Skin: No rashes, lesions or ulcers     Data Reviewed: I have personally reviewed following labs and imaging studies  CBC:  Recent Labs Lab 10/05/15 1520 10/07/15 0528 10/08/15 0534 10/11/15 0545  WBC 3.2* 5.0 5.4 6.4  HGB 10.0* 9.0* 10.2* 10.2*  HCT 32.0* 28.2* 32.1* 41.2  MCV 91.4 90.1 92.2 105.9*  PLT 261 259 265 0000000   Basic Metabolic Panel:  Recent Labs Lab 10/07/15 0528 10/08/15 0534 10/09/15 0631 10/10/15 0504 10/11/15 0545  NA 144 143 144 143 141  K 3.0* 3.6 3.1* 3.5 3.7  CL 110 110 107 108 106  CO2 26 22 24 24 23   GLUCOSE 121* 109* 117* 117* 166*  BUN 13 11 10 8 10    CREATININE 1.85* 1.89* 1.70* 1.64* 1.53*  CALCIUM 8.3* 8.3* 8.6* 8.4* 8.6*  MG 1.4*  --   --  1.4* 2.2   GFR: Estimated Creatinine Clearance: 44.4 mL/min (by C-G formula based on SCr of 1.53 mg/dL). Liver Function Tests:  Recent Labs Lab 10/10/15 0504 10/11/15 0545  AST 29 37  ALT 23 20  ALKPHOS 132* 139*  BILITOT 1.6* 1.9*  PROT 5.1* 5.6*  ALBUMIN 2.3* 2.5*   No results for input(s): LIPASE, AMYLASE in the last 168 hours. No results for input(s): AMMONIA in the last 168 hours. Coagulation Profile: No results for input(s): INR, PROTIME in the last 168 hours. Cardiac Enzymes: No results for input(s): CKTOTAL, CKMB, CKMBINDEX, TROPONINI in the last 168 hours. BNP (last 3 results) No results for input(s): PROBNP in the last 8760 hours. HbA1C: No results for input(s): HGBA1C in the last 72 hours. CBG:  Recent Labs Lab 10/10/15 1834 10/10/15 2114 10/11/15 0756 10/11/15 1232 10/11/15 1553  GLUCAP 139* 199* 158* 239* 311*   Lipid Profile: No results for input(s): CHOL, HDL, LDLCALC, TRIG, CHOLHDL, LDLDIRECT in the last 72 hours. Thyroid Function Tests: No results for input(s): TSH, T4TOTAL, FREET4, T3FREE, THYROIDAB in the last 72 hours. Anemia Panel: No results for input(s): VITAMINB12, FOLATE, FERRITIN, TIBC, IRON, RETICCTPCT in the last 72 hours. Sepsis Labs: No results for input(s): PROCALCITON, LATICACIDVEN in the last 168 hours.  Recent Results (from the past 240 hour(s))  Culture, blood (Routine X 2) w Reflex to ID Panel     Status: None   Collection Time: 10/01/15 11:40 PM  Result Value Ref Range Status   Specimen Description BLOOD LEFT ARM  Final   Special Requests   Final    BOTTLES DRAWN AEROBIC AND ANAEROBIC 10CC PT ON VANC ZOSYN   Culture NO GROWTH 5 DAYS  Final   Report Status 10/07/2015 FINAL  Final  Culture, blood (Routine X 2) w Reflex to ID Panel     Status: None   Collection Time: 10/01/15 11:45 PM  Result Value Ref Range Status   Specimen  Description BLOOD LEFT HAND  Final   Special Requests IN PEDIATRIC BOTTLE 4CC  Final   Culture NO GROWTH 5 DAYS  Final  Report Status 10/07/2015 FINAL  Final  Urine culture     Status: Abnormal   Collection Time: 10/01/15 11:57 PM  Result Value Ref Range Status   Specimen Description URINE, RANDOM  Final   Special Requests NONE  Final   Culture >=100,000 COLONIES/mL YEAST (A)  Final   Report Status 10/03/2015 FINAL  Final  MRSA PCR Screening     Status: Abnormal   Collection Time: 10/02/15  9:40 AM  Result Value Ref Range Status   MRSA by PCR POSITIVE (A) NEGATIVE Final    Comment:        The GeneXpert MRSA Assay (FDA approved for NASAL specimens only), is one component of a comprehensive MRSA colonization surveillance program. It is not intended to diagnose MRSA infection nor to guide or monitor treatment for MRSA infections. RESULT CALLED TO, READ BACK BY AND VERIFIED WITH: Bernarda Caffey RN 11:45 10/02/15 ([email or web address])   Aerobic/Anaerobic Culture (surgical/deep wound)     Status: None   Collection Time: 10/03/15  5:00 PM  Result Value Ref Range Status   Specimen Description ABSCESS BACK  Final   Special Requests NONE  Final   Gram Stain   Final    FEW WBC PRESENT,BOTH PMN AND MONONUCLEAR NO ORGANISMS SEEN    Culture No growth aerobically or anaerobically.  Final   Report Status 10/08/2015 FINAL  Final         Radiology Studies: Dg Esophagus  Result Date: 10/10/2015 CLINICAL DATA:  Epigastric pain, solid sticking in upper/ mid esophagus EXAM: ESOPHOGRAM/BARIUM SWALLOW TECHNIQUE: Single contrast examination was performed using  thin barium. FLUOROSCOPY TIME:  Radiation Exposure Index (as provided by the fluoroscopic device): 25.1 mGy COMPARISON:  None. FINDINGS: Moderate to severe esophageal dysmotility. Lack of an effective normal primary peristaltic wave. Stasis of contrast in the mid/distal esophagus. No fixed esophageal narrowing or stricture is seen. However, a 13 mm  barium tablet was not administered due to esophageal dysmotility. No definite hiatal hernia. Patient could not be evaluated for reflux. IMPRESSION: Moderate to severe esophageal dysmotility. Electronically Signed   By: Julian Hy M.D.   On: 10/10/2015 17:07       Scheduled Meds: . amiodarone  200 mg Oral Daily  . antiseptic oral rinse  7 mL Mouth Rinse q12n4p  . apixaban  5 mg Oral BID  . chlorhexidine  15 mL Mouth Rinse BID  . cycloSPORINE  1 drop Both Eyes BID  . feeding supplement (GLUCERNA SHAKE)  237 mL Oral TID BM  . finasteride  5 mg Oral QHS  . fluconazole (DIFLUCAN) IV  100 mg Intravenous Q24H  . hydrOXYzine  25 mg Oral TID  . insulin aspart  0-15 Units Subcutaneous TID WC  . megestrol  200 mg Oral Daily  . metoprolol tartrate  50 mg Oral BID  . mometasone-formoterol  2 puff Inhalation BID  . pilocarpine  1 drop Both Eyes TID  . predniSONE  7 mg Oral Q breakfast  . sodium chloride flush  3 mL Intravenous Q12H  . tamsulosin  0.4 mg Oral QHS  . traZODone  100 mg Oral QHS   Continuous Infusions: . sodium chloride 50 mL/hr at 10/09/15 1240     LOS: 9 days    Time spent: 30 minutes. More than 50% time spent on updating and explaining plan of care to family     Trea Carnegie, MD PhD [facility] Pager [phone]  If 7PM-7AM, please contact night-coverage [email or web address] Password [facility] 10/11/2015, 5:59 PM

## 2015-10-12 LAB — CBC
HCT: 28.6 % — ABNORMAL LOW (ref 39.0–52.0)
Hemoglobin: 9 g/dL — ABNORMAL LOW (ref 13.0–17.0)
MCH: 28.6 pg (ref 26.0–34.0)
MCHC: 31.5 g/dL (ref 30.0–36.0)
MCV: 90.8 fL (ref 78.0–100.0)
Platelets: 283 10*3/uL (ref 150–400)
RBC: 3.15 MIL/uL — ABNORMAL LOW (ref 4.22–5.81)
RDW: 16.2 % — ABNORMAL HIGH (ref 11.5–15.5)
WBC: 5.5 10*3/uL (ref 4.0–10.5)

## 2015-10-12 LAB — COMPREHENSIVE METABOLIC PANEL
ALT: 23 U/L (ref 17–63)
AST: 21 U/L (ref 15–41)
Albumin: 2.3 g/dL — ABNORMAL LOW (ref 3.5–5.0)
Alkaline Phosphatase: 118 U/L (ref 38–126)
Anion gap: 11 (ref 5–15)
BUN: 7 mg/dL (ref 6–20)
CO2: 23 mmol/L (ref 22–32)
Calcium: 8.3 mg/dL — ABNORMAL LOW (ref 8.9–10.3)
Chloride: 106 mmol/L (ref 101–111)
Creatinine, Ser: 1.51 mg/dL — ABNORMAL HIGH (ref 0.61–1.24)
GFR calc Af Amer: 50 mL/min — ABNORMAL LOW (ref >60)
GFR calc non Af Amer: 43 mL/min — ABNORMAL LOW (ref >60)
Glucose, Bld: 121 mg/dL — ABNORMAL HIGH (ref 65–99)
Potassium: 2.9 mmol/L — ABNORMAL LOW (ref 3.5–5.1)
Sodium: 140 mmol/L (ref 135–145)
Total Bilirubin: 1.3 mg/dL — ABNORMAL HIGH (ref 0.3–1.2)
Total Protein: 4.9 g/dL — ABNORMAL LOW (ref 6.5–8.1)

## 2015-10-12 LAB — GLUCOSE, CAPILLARY
Glucose-Capillary: 132 mg/dL — ABNORMAL HIGH (ref 65–99)
Glucose-Capillary: 215 mg/dL — ABNORMAL HIGH (ref 65–99)
Glucose-Capillary: 240 mg/dL — ABNORMAL HIGH (ref 65–99)
Glucose-Capillary: 237 mg/dL — ABNORMAL HIGH (ref 65–99)

## 2015-10-12 LAB — HIV ANTIBODY (ROUTINE TESTING W REFLEX): HIV Screen 4th Generation wRfx: NONREACTIVE

## 2015-10-12 MED ORDER — POTASSIUM CHLORIDE CRYS ER 20 MEQ PO TBCR
40.0000 meq | EXTENDED_RELEASE_TABLET | ORAL | Status: AC
  Administered 2015-10-12 (×2): 40 meq via ORAL
  Filled 2015-10-12 (×2): qty 2

## 2015-10-12 NOTE — Progress Notes (Signed)
PROGRESS NOTE    Jacob [name]  [id number] DOB: Nov 05, 1940 DOA: 10/01/2015 PCP: No primary care provider on file.    Brief Narrative: Jacob [name] is a 75 y.o. male with medical history significant of CAD, CABG, IDDM, COPD, dyslipidemia, RCC, PMR, HTN, CKD, OSA , lumbar stenosis s/p correction, and recently was hospitalized with discitis,  was on rocephin and vancomycin till 7/13, presents for altered mental status. On arrival to ED, he was found to be febrile, tachycardic, hypotensive, in a fib with RVR, . He was admitted to step down for further management.  Cardiology and PCCM consulted.  PCCM signed off recommending a repeat MRI of the lumbar spine. MRI spine showed fluid collection , suspect abscess, NS and IR consulted for further eval.  CT guided aspiration of the fluid collection done on 7/20 and cultures are pending. No organisms seen.  ID on board to help [name] with antibiotics.  PT repeat eval requested to see if he is eligible for inpatient rehab.  Assessment & Plan:   Principal Problem:   Sepsis ([place]) Active Problems:   Diabetes mellitus, type 2 ([place])   CHF with left ventricular diastolic dysfunction, NYHA class 2 (HCC)   Acute encephalopathy   Elevated troponin   Acute kidney injury superimposed on chronic kidney disease (HCC)   Atrial fibrillation with RVR (HCC)   Hypotension   Abscess   AKI (acute kidney injury) ([place])  Sepsis of unclear source possibly from health care associated pneumonia vs recurrent discitis.  CXR shows left lobe air space consolidation.  MRI of the lumbar spine ordered to further evaluate the discitis.  It showed 1.4 x 2.5 x 3.3 cm rim enhancing paraspinal fluid collection concerning for abscess. 7 x 15 mm fluid collection in RIGHT laminectomy defect equivocal for abscess, and could represent seroma or possibly hematoma. IR consulted and he underwent CT guided aspiration of the fluid collection , the aspirate is negative for organisms, few wbc  seen and cultures have been negative so far. Requested neuro surgery consult in view of the abnormal MRI, suggested probably not an abscess as the cultures have been negative.  Of note he just completed 4 weeks of vancomycin and rocephin on 7/13.  He was started on IV vancomycin and IV zosyn, IV azithromycin on admission , later transitioned to zosyn alone, which was discontinued on 7/24 as all the cultures have been negative and a repeat CXR did not show any consolidation. monitor off antibiotics Lactic acid is normalized,  Blood cultures negative so far.  He is afebrile and repeat labs to check WBC count, show normal wbc count.    Acute vs subacute encephalopathy with bouts of agitation, labile mood, crying at times, anxiety/depression: EEG is sub optimal study as pt would not stay still , but it  Showed diffuse cerebral dysfunction, non specific in nature. , no epileptiform discharges seen.  MRI was ordered, but patient could not tolerate it even with ativan. We discontinued the test after discussing with the family.  Last mri brain from 08/29/2015 no acute findings After talking to the families, these symptoms has been wax and wean in the last few months, ammonia/tsh/b12/rpr/hiv negative, he was seen by neurology for this on 7/17 who recommended minimizing sedative meds and referred to have psych evaluation,  Psych consulted on 7/28 who recommended trial of hydroxyzine for anxiety and trazodone for insomnia Improving, has been calm during day time since 7/28, still has sun downing which is chronic per wife  PAF/Atrial fibrillation with RVR:  Serial troponins are elevated and peaked at 0.9 , cardiology Dr Wynonia Lawman consulted Currently on amiodarone and beta blocker, Started the patient on Eliquis Converted to NSR Patient will not keep tele on, d/c tele on 7/29   Acute kidney injury on STAGE 3 CKD with solitary kidney (h/o R renal cell carcinoma s/p resection in 07/2015); Possibly from  sepsis, dehydration.   US renal does not show any hydronephrosis.   Pt is incontinent and urine output couldn't be measure appropriately.  UA shows SMALL LEUKOCYTOSIS with few bacteria and wbc.  Urine cultures done , show yeast, started diflucan due to underline immunosuppression Cr improving   IDDM: a1c 6.8 in 08/2015 SLP  EVAL recommended restarting  Dysphagia 3 diet.  CBG (last 3)   Recent Labs  10/11/15 2115 10/12/15 0808 10/12/15 1205  GLUCAP 83 132* 215*  resume SSI.    Nutrition: Carb modified diet with supplementations.   Hypernatremia: Probably from dehydration and Poor po intake.  Resolved after fluid hydration.    Leukopenia,  Unclear etiology. Probably from IV antibiotics or sepsis.  Resolved.  Anemia: Baseline hemoglobin between 9 to 10,  Stable. Monitor.   COPD: No wheezing heard, resume Symbicort and duo nebs as needed.   PMR:  On  Maintenance dose of prednisone, continue the same.      Hypokalemia/hypomagnesemia: , replace prn to keep k>4, mag >2    Hypertension: suboptimal.  Add prn hydralazine.   Poor appetite, Difficulty swallowing vs patient does not want to swallow,DG  Esophagus showed moderate to severe esophageal dysmotility, I have updated daughter and recommended patient to have  Smaller meals and multiple times a day, soft diet recommended trial of megace for appetite stimulation, nutrition supplement  FTT; patient had multiple hospitalization with multiple surgeries and multiple infections in the last few months, has not been getting significant improvement. Will need snf  DVT prophylaxis: sq heparin. Code Status: do not intubate only. Family Communication: talked to daughter and wife over the phone o n 7/26 , talked to wife and daughter in person on 7/27, talked to daughter over the phone on 7/28 Disposition Plan: need snf at discharge,  Family reports not happy with the care at previous SNF    Consultants:   Cardiology Dr  Wynonia Lawman.  PCCM  ID Dr Linus Salmons  IR  Neuro surgery. Dr Parks Neptune.  Procedures:  MRI LUMBAR SPINE.  EEG.  Antimicrobials:  Vancomycin 7/19 to 7/20 Zosyn 7/19- 7/24  Azithromycin 7/19  Subjective: Calm and  pleasant, denies any discomfort, states want to get out of bed and work with physical therapy, he is more alert and making meaningful conversation No crying spells, no agitation today  Objective: Vitals:   10/11/15 1545 10/11/15 2119 10/12/15 0453 10/12/15 1624  BP: (!) 157/69 138/90 (!) 137/58 (!) 168/73  Pulse: 75 75 72 75  Resp:  20 19 18   Temp: 98.1 F (36.7 C) 98.2 F (36.8 C) 98.5 F (36.9 C) 97.9 F (36.6 C)  TempSrc: Oral   Oral  SpO2: 98% 96% 99% 98%  Weight:      Height:        Intake/Output Summary (Last 24 hours) at 10/12/15 1629 Last data filed at 10/11/15 1831  Gross per 24 hour  Intake          3075.83 ml  Output                0 ml  Net  3075.83 ml   Filed Weights   10/06/15 0449 10/07/15 0426 10/07/15 1629  Weight: 87.7 kg (193 lb 5.5 oz) 88.3 kg (194 lb 10.7 oz) 89 kg (196 lb 4.8 oz)    Examination:  General exam: more alert and interactive, very pleasant ,  NAD  Respiratory system: diminished at bases, no wheezing or rhonchi.  Cardiovascular system: regular , s1s2, rate controlled.  Gastrointestinal system: Abdomen is nondistended, soft and nontender. No organomegaly or masses felt. Normal bowel sounds heard. Central nervous system: more alert, oriented to person and place, know it is 2017 Skin: No rashes, lesions or ulcers     Data Reviewed: I have personally reviewed following labs and imaging studies  CBC:  Recent Labs Lab 10/07/15 0528 10/08/15 0534 10/11/15 0545 10/12/15 0422  WBC 5.0 5.4 6.4 5.5  HGB 9.0* 10.2* 10.2* 9.0*  HCT 28.2* 32.1* 41.2 28.6*  MCV 90.1 92.2 105.9* 90.8  PLT 259 265 233 [id number]   Basic Metabolic Panel:  Recent Labs Lab 10/07/15 0528 10/08/15 0534 10/09/15 0631 10/10/15 0504  10/11/15 0545 10/12/15 0422  NA 144 143 144 143 141 140  K 3.0* 3.6 3.1* 3.5 3.7 2.9*  CL 110 110 107 108 106 106  CO2 26 22 24 24 23 23   GLUCOSE 121* 109* 117* 117* 166* 121*  BUN 13 11 10 8 10 7   CREATININE 1.85* 1.89* 1.70* 1.64* 1.53* 1.51*  CALCIUM 8.3* 8.3* 8.6* 8.4* 8.6* 8.3*  MG 1.4*  --   --  1.4* 2.2  --    GFR: Estimated Creatinine Clearance: 45 mL/min (by C-G formula based on SCr of 1.51 mg/dL). Liver Function Tests:  Recent Labs Lab 10/10/15 0504 10/11/15 0545 10/12/15 0422  AST 29 37 21  ALT 23 20 23   ALKPHOS 132* 139* 118  BILITOT 1.6* 1.9* 1.3*  PROT 5.1* 5.6* 4.9*  ALBUMIN 2.3* 2.5* 2.3*   No results for input(s): LIPASE, AMYLASE in the last 168 hours. No results for input(s): AMMONIA in the last 168 hours. Coagulation Profile: No results for input(s): INR, PROTIME in the last 168 hours. Cardiac Enzymes: No results for input(s): CKTOTAL, CKMB, CKMBINDEX, TROPONINI in the last 168 hours. BNP (last 3 results) No results for input(s): PROBNP in the last 8760 hours. HbA1C: No results for input(s): HGBA1C in the last 72 hours. CBG:  Recent Labs Lab 10/11/15 1232 10/11/15 1553 10/11/15 2115 10/12/15 0808 10/12/15 1205  GLUCAP 239* 311* 83 132* 215*   Lipid Profile: No results for input(s): CHOL, HDL, LDLCALC, TRIG, CHOLHDL, LDLDIRECT in the last 72 hours. Thyroid Function Tests: No results for input(s): TSH, T4TOTAL, FREET4, T3FREE, THYROIDAB in the last 72 hours. Anemia Panel: No results for input(s): VITAMINB12, FOLATE, FERRITIN, TIBC, IRON, RETICCTPCT in the last 72 hours. Sepsis Labs: No results for input(s): PROCALCITON, LATICACIDVEN in the last 168 hours.  Recent Results (from the past 240 hour(s))  Aerobic/Anaerobic Culture (surgical/deep wound)     Status: None   Collection Time: 10/03/15  5:00 PM  Result Value Ref Range Status   Specimen Description ABSCESS BACK  Final   Special Requests NONE  Final   Gram Stain   Final    FEW WBC  PRESENT,BOTH PMN AND MONONUCLEAR NO ORGANISMS SEEN    Culture No growth aerobically or anaerobically.  Final   Report Status 10/08/2015 FINAL  Final         Radiology Studies: Dg Esophagus  Result Date: 10/10/2015 CLINICAL DATA:  Epigastric pain, solid sticking in  upper/ mid esophagus EXAM: ESOPHOGRAM/BARIUM SWALLOW TECHNIQUE: Single contrast examination was performed using  thin barium. FLUOROSCOPY TIME:  Radiation Exposure Index (as provided by the fluoroscopic device): 25.1 mGy COMPARISON:  None. FINDINGS: Moderate to severe esophageal dysmotility. Lack of an effective normal primary peristaltic wave. Stasis of contrast in the mid/distal esophagus. No fixed esophageal narrowing or stricture is seen. However, a 13 mm barium tablet was not administered due to esophageal dysmotility. No definite hiatal hernia. Patient could not be evaluated for reflux. IMPRESSION: Moderate to severe esophageal dysmotility. Electronically Signed   By: Julian Hy M.D.   On: 10/10/2015 17:07       Scheduled Meds: . amiodarone  200 mg Oral Daily  . antiseptic oral rinse  7 mL Mouth Rinse q12n4p  . apixaban  5 mg Oral BID  . chlorhexidine  15 mL Mouth Rinse BID  . cycloSPORINE  1 drop Both Eyes BID  . feeding supplement (GLUCERNA SHAKE)  237 mL Oral TID BM  . finasteride  5 mg Oral QHS  . fluconazole (DIFLUCAN) IV  100 mg Intravenous Q24H  . hydrOXYzine  25 mg Oral TID  . insulin aspart  0-15 Units Subcutaneous TID WC  . megestrol  200 mg Oral Daily  . metoprolol tartrate  50 mg Oral BID  . mometasone-formoterol  2 puff Inhalation BID  . pilocarpine  1 drop Both Eyes TID  . predniSONE  7 mg Oral Q breakfast  . sodium chloride flush  3 mL Intravenous Q12H  . tamsulosin  0.4 mg Oral QHS  . traZODone  100 mg Oral QHS   Continuous Infusions: . sodium chloride 50 mL/hr at 10/12/15 0549     LOS: 10 days    Time spent: 30 minutes. More than 50% time spent on updating and explaining plan of  care to family     Mavi Un, MD PhD [facility] Pager [phone]  If 7PM-7AM, please contact night-coverage [email or web address] Password [facility] 10/12/2015, 4:29 PM

## 2015-10-12 NOTE — Progress Notes (Signed)
Notified on call provider in regards to patient continually taking off telemetry leads. Provider stated would bring this to attendings attention.

## 2015-10-13 LAB — COMPREHENSIVE METABOLIC PANEL
ALT: 22 U/L (ref 17–63)
AST: 32 U/L (ref 15–41)
Albumin: 2.4 g/dL — ABNORMAL LOW (ref 3.5–5.0)
Alkaline Phosphatase: 112 U/L (ref 38–126)
Anion gap: 8 (ref 5–15)
BUN: 10 mg/dL (ref 6–20)
CO2: 26 mmol/L (ref 22–32)
Calcium: 8.4 mg/dL — ABNORMAL LOW (ref 8.9–10.3)
Chloride: 106 mmol/L (ref 101–111)
Creatinine, Ser: 1.52 mg/dL — ABNORMAL HIGH (ref 0.61–1.24)
GFR calc Af Amer: 50 mL/min — ABNORMAL LOW (ref >60)
GFR calc non Af Amer: 43 mL/min — ABNORMAL LOW (ref >60)
Glucose, Bld: 193 mg/dL — ABNORMAL HIGH (ref 65–99)
Potassium: 3.7 mmol/L (ref 3.5–5.1)
Sodium: 140 mmol/L (ref 135–145)
Total Bilirubin: 1.3 mg/dL — ABNORMAL HIGH (ref 0.3–1.2)
Total Protein: 4.9 g/dL — ABNORMAL LOW (ref 6.5–8.1)

## 2015-10-13 LAB — GLUCOSE, CAPILLARY
Glucose-Capillary: 188 mg/dL — ABNORMAL HIGH (ref 65–99)
Glucose-Capillary: 249 mg/dL — ABNORMAL HIGH (ref 65–99)
Glucose-Capillary: 353 mg/dL — ABNORMAL HIGH (ref 65–99)
Glucose-Capillary: 316 mg/dL — ABNORMAL HIGH (ref 65–99)

## 2015-10-13 LAB — MAGNESIUM: Magnesium: 1.8 mg/dL (ref 1.7–2.4)

## 2015-10-13 MED ORDER — AMIODARONE HCL 200 MG PO TABS: 200.0000 mg | ORAL_TABLET | Freq: Every day | ORAL | 0 refills | Status: DC

## 2015-10-13 MED ORDER — MEGESTROL ACETATE 40 MG/ML PO SUSP: 200.0000 mg | Freq: Every day | ORAL | 0 refills | Status: DC

## 2015-10-13 MED ORDER — SENNA-DOCUSATE SODIUM 8.6-50 MG PO TABS: 1.0000 | ORAL_TABLET | Freq: Every day | ORAL | 0 refills | Status: DC

## 2015-10-13 MED ORDER — INSULIN ASPART 100 UNIT/ML ~~LOC~~ SOLN
7.0000 [IU] | Freq: Once | SUBCUTANEOUS | Status: AC
  Administered 2015-10-13: 7 [IU] via SUBCUTANEOUS

## 2015-10-13 MED ORDER — APIXABAN 5 MG PO TABS: 5.0000 mg | ORAL_TABLET | Freq: Two times a day (BID) | ORAL | 0 refills | Status: DC

## 2015-10-13 MED ORDER — HYDROXYZINE HCL 25 MG PO TABS: 25.0000 mg | ORAL_TABLET | Freq: Three times a day (TID) | ORAL | 0 refills | Status: DC

## 2015-10-13 MED ORDER — POTASSIUM CHLORIDE ER 20 MEQ PO TBCR
40.0000 meq | EXTENDED_RELEASE_TABLET | ORAL | 0 refills | Status: DC
Start: 2015-10-14 — End: 2015-11-29

## 2015-10-13 MED ORDER — MAGNESIUM OXIDE 400 MG PO TABS: 400.0000 mg | ORAL_TABLET | Freq: Every day | ORAL | 0 refills | Status: DC

## 2015-10-13 MED ORDER — INSULIN ASPART 100 UNIT/ML ~~LOC~~ SOLN: SUBCUTANEOUS | 0 refills | Status: DC

## 2015-10-13 NOTE — Discharge Summary (Addendum)
Discharge Summary  Jacob [name] [id number] DOB: Jan 22, 1941  PCP: No primary care provider on file.  Admit date: 10/01/2015 Discharge date: 10/14/2015  Time spent: >56mins  Recommendations for Outpatient Follow-up:  1. F/u with SNF MD  for hospital discharge follow up, repeat cbc/cmp at follow up 2. F/u with neurology and psychiatry for encephalopathy/anxiety 3. F/u with cardiology Dr Wynonia Lawman for afib  Discharge Diagnoses:  Active Hospital Problems   Diagnosis Date Noted  . Sepsis ([place]) 10/02/2015  . Abscess   . AKI (acute kidney injury) ([place])   . Acute kidney injury superimposed on chronic kidney disease ([place]) 10/02/2015  . Atrial fibrillation with RVR ([place]) 10/02/2015  . Hypotension 10/02/2015  . Acute encephalopathy 08/29/2015  . Elevated troponin 08/29/2015  . CHF with left ventricular diastolic dysfunction, NYHA class 2 ([place]) 01/28/2014  . Diabetes mellitus, type 2 ([place]) 12/19/2011    Resolved Hospital Problems   Diagnosis Date Noted Date Resolved  No resolved problems to display.    Discharge Condition: stable  Diet recommendation: heart healthy/carb modified  Patient has esophageal dysmotility: Need soft diet/thin liquids,  Small meal each time/multiple meals  Patient to sit up at [place] when eating and 1hr after eating  Filed Weights   10/06/15 0449 10/07/15 0426 10/07/15 1629  Weight: 87.7 kg (193 lb 5.5 oz) 88.3 kg (194 lb 10.7 oz) 89 kg (196 lb 4.8 oz)    History of present illness:  Chief Complaint: Altered mental status  HPI: Jacob [name] is a 75 y.o. male with medical history significant of CAD, CABG, IDDM, COPD, dyslipidemia, RCC, PMR, HTN, CKD, OSA , lumbar stenosis s/p correction, and recently was hospitalized with discitis for which a PICC line was placed for IV antibiotics of vancomycin that he received a [facility] facility; who presented with complaints of being found acutely altered. Patient is unable to provide adequate history  and most of history is obtained from the patient's family members who are present at bedside. Patient had chest completed his IV antibiotic course approximately 2 days ago. His granddaughter went to check on him this afternoon at the nursing facility and reported that he was more confused and uterosacral and had not E [place] drink anything in several days. Patient was noted to be in atrial fibrillation with a heart rate of up to 170 with fever documented as high as 104F. Occasional complaint of cramps in his leg and lower back pain. Not been any significant complaints of shortness of breath or chest pain.   ED Course: Upon admission to department patient was evaluated and seen to be febrile with a temperature of 102.48F, heart rates irregular in the 150s to 160s,  blood pressures as low as 83/67, and 2 saturations maintained on room air currently. Initial lab work revealed WBC 1.6, hemoglobin 10.1, platelets 218, sodium 149, potassium 2.9, chloride 114, CO2 26, BUN 32, creatinine 2.33, calcium 9.2, magnesium 1.7, BNP 1601.7, and troponin of 2.03. Patient found to have signs of a left lower lobe pneumonia on chest x-ray and urinalysis was positive for yeast, small leukocytes, 6-30 WBCs, and few bacteria. Sepsis protocol was initiated and the patient received the recommended fluid bolus and was started on antibiotics of vancomycin and Zosyn. He also received 20 mEq of potassium chloride while in the ED. Cardiology and PCCM evaluated the patient. He was to resume amiodarone drip and it was recommended that TRH admitted to step down.  PCCM signed off recommending a repeat MRI of the lumbar  spine. MRI spine showed fluid collection , suspect abscess, NS and IR consulted for further eval.  CT guided aspiration of the fluid collection done on 7/20 and cultures are pending. No organisms seen.  ID on board to help [name] with antibiotics.   Hospital Course:  Principal Problem:   Sepsis ([place]) Active Problems:    Diabetes mellitus, type 2 ([place])   CHF with left ventricular diastolic dysfunction, NYHA class 2 ([facility])   Acute encephalopathy   Elevated troponin   Acute kidney injury superimposed on chronic kidney disease ([facility])   Atrial fibrillation with RVR ([facility])   Hypotension   Abscess   AKI (acute kidney injury) ([place])   Sepsis of unclear source possibly from health care associated pneumonia vs recurrent discitis.  CXR shows left lobe air space consolidation.  MRI of the lumbar spine ordered to further evaluate the discitis.  It showed 1.4 x 2.5 x 3.3 cm rim enhancing paraspinal fluid collection concerning for abscess. 7 x 15 mm fluid collection in RIGHT laminectomy defect equivocal for abscess, and could represent seroma or possibly hematoma. IR consulted and he underwent CT guided aspiration of the fluid collection , the aspirate is negative for organisms, few wbc seen and cultures have been negative so far. Requested neuro surgery consult in view of the abnormal MRI, suggested probably not an abscess as the cultures have been negative.  Of note he just completed 4 weeks of vancomycin and rocephin on 7/13.  He was started on IV vancomycin and IV zosyn, IV azithromycin on admission , later transitioned to zosyn alone, which was discontinued on 7/24 as all the cultures have been negative and a repeat CXR did not show any consolidation. monitor off antibiotics Lactic acid is normalized,  Blood cultures negative. He is afebrile and repeat labs to check WBC count, show normal wbc count.    Acute vs subacute encephalopathy with bouts of agitation, labile mood, crying at times, anxiety/depression: EEG is sub optimal study as pt would not stay still , but it  Showed diffuse cerebral dysfunction, non specific in nature. , no epileptiform discharges seen.  MRI was ordered, but patient could not tolerate it even with ativan. We discontinued the test after discussing with the family.  Last mri brain from  08/29/2015 no acute findings After talking to the families, these symptoms has been wax and wean in the last few months, ammonia/tsh/b12/rpr/hiv negative, he was seen by neurology for this on 7/17 who recommended minimizing sedative meds and referred to have psych evaluation,  Psych consulted on 7/28 who recommended trial of hydroxyzine for anxiety and trazodone for insomnia Improving, has been calm during day time since 7/28, still has sun downing which is chronic per wife Patient is to continue follow with neurology and psychiatry as outpatient basis    PAF/Atrial fibrillation with RVR:  Serial troponins are elevated and peaked at 0.9 , cardiology Dr Wynonia Lawman consulted Currently on amiodarone and beta blocker, Started the patient on Eliquis Converted to NSR Patient will not keep tele on, d/c tele on 7/29 Outpatient cardiology follow up   Acute kidney injury on STAGE 3 CKD with solitary kidney (h/o R renal cell carcinoma s/p resection in 07/2015); Possibly from sepsis, dehydration.   US renal does not show any hydronephrosis.   Pt is incontinent and urine output couldn't be measure appropriately.  UA shows SMALL LEUKOCYTOSIS with few bacteria and wbc.  Urine cultures done , show yeast, started diflucan due to underline immunosuppression Cr improving and  has been stable at 1.5 for the last three days, likely baseline   IDDM: a1c 6.8 in 08/2015 SLP  EVAL recommended restarting  Dysphagia 3 diet.  CBG (last 3)   Recent Labs (last 2 labs)    Recent Labs  10/11/15 2115 10/12/15 0808 10/12/15 1205  GLUCAP 83 132* 215*    resume SSI.    Nutrition: Carb modified diet with supplementations.   Hypernatremia: Probably from dehydration and Poor po intake.  Resolved after fluid hydration.    Leukopenia,  Unclear etiology. Probably from IV antibiotics or sepsis.  Resolved.  Anemia: Baseline hemoglobin between 9 to 10,  Stable. Monitor.   COPD: No wheezing  heard, resume Symbicort and duo nebs as needed.   PMR:  On  Maintenance dose of prednisone, continue the same.     Hypokalemia/hypomagnesemia: , replace prn to keep k>4, mag >2   Hypertension:  Lopressor 100mg  bid, , bp meds need to be continue titrated by snf MD.      Poor appetite, Difficulty swallowing vs patient does not want to swallow,DG  Esophagus showed moderate to severe esophageal dysmotility, I have updated daughter and recommended patient to have  Smaller meals and multiple times a day, soft diet recommended, nutrition supplement  FTT; patient had multiple hospitalization with multiple surgeries and multiple infections in the last few months, has not been getting significant improvement.  snf placement  DVT prophylaxis while in the hospital: sq heparin. Code Status: do not intubate only. Family Communication: talked to daughter and wife over the phone o n 7/26 , talked to wife and daughter in person on 7/27, talked to daughter over the phone on 7/28, talked to wife over the phone on 7/30, talked to son in room on 7/31 Disposition Plan: snf    Consultants:   Cardiology Dr Wynonia Lawman.  PCCM  ID Dr Linus Salmons  IR  Neuro surgery. Dr Parks Neptune.  Procedures:  MRI LUMBAR SPINE.  EEG.  Antimicrobials:  Vancomycin 7/19 to 7/20 Zosyn 7/19- 7/24  Azithromycin 7/19     Discharge Exam: BP (!) 170/78 (BP Location: Left Arm)   Pulse 75   Temp 97.8 F (36.6 C) (Oral)   Resp 16   Ht 5\' 11"  (1.803 m)   Wt 89 kg (196 lb 4.8 oz)   SpO2 99%   BMI 27.38 kg/m    General exam: more alert and interactive, very pleasant ,  NAD  Respiratory system: diminished at bases, no wheezing or rhonchi.  Cardiovascular system: regular , s1s2, rate controlled.  Gastrointestinal system: Abdomen is nondistended, soft and nontender. No organomegaly or masses felt. Normal bowel sounds heard. Central nervous system: more alert and cooperative, oriented to person and place,  states it is 2015 Skin: No rashes, lesions or ulcers   Discharge Instructions You were cared for by a hospitalist during your hospital stay. If you have any questions about your discharge medications or the care you received while you were in the hospital after you are discharged, you can call the unit and asked to speak with the hospitalist on call if the hospitalist that took care of you is not available. Once you are discharged, your primary care physician will handle any further medical issues. Please note that NO REFILLS for any discharge medications will be authorized once you are discharged, as it is imperative that you return to your primary care physician (or establish a relationship with a primary care physician if you do not have one) for your aftercare needs  so that they can reassess your need for medications and monitor your lab values.  Discharge Instructions    Diet - low sodium heart healthy    Complete by:  As directed   Carb modified, soft diet, patient to sit up at 90degree to eat and not to lay down 1hr after eating Encourage small meal each time and multiple times a day   Increase activity slowly    Complete by:  As directed       Medication List    STOP taking these medications   amLODipine 10 MG tablet Commonly known as:  NORVASC   aspirin EC 81 MG tablet   cefTRIAXone 2 g in dextrose 5 % 50 mL   ergocalciferol 50000 units capsule Commonly known as:  VITAMIN D2   insulin NPH-regular Human (70-30) 100 UNIT/ML injection Commonly known as:  NOVOLIN 70/30   LEVEMIR FLEXPEN 100 UNIT/ML Pen Generic drug:  Insulin Detemir   oxyCODONE 5 MG immediate release tablet Commonly known as:  Oxy IR/ROXICODONE   polyethylene glycol packet Commonly known as:  MIRALAX / GLYCOLAX   POTASSIUM PO   traMADol 50 MG tablet Commonly known as:  ULTRAM   vancomycin 500 mg in sodium chloride 0.9 % 100 mL   VICTOZA 18 MG/3ML Sopn Generic drug:  Liraglutide     TAKE these  medications   acetaminophen 325 MG tablet Commonly known as:  TYLENOL Take 2 tablets (650 mg total) by mouth every 6 (six) hours as needed for mild pain (or Fever >/= 101).   acidophilus Caps capsule Take 2 capsules by mouth daily.   albuterol 108 (90 Base) MCG/ACT inhaler Commonly known as:  PROVENTIL HFA;VENTOLIN HFA Inhale 2 puffs into the lungs every 6 (six) hours as needed for wheezing or shortness of breath.   amiodarone 200 MG tablet Commonly known as:  PACERONE Take 1 tablet (200 mg total) by mouth daily.   apixaban 5 MG Tabs tablet Commonly known as:  ELIQUIS Take 1 tablet (5 mg total) by mouth 2 (two) times daily.   atorvastatin 20 MG tablet Commonly known as:  LIPITOR Take 1 tablet (20 mg total) by mouth daily at 6 PM.   budesonide-formoterol 160-4.5 MCG/ACT inhaler Commonly known as:  SYMBICORT Inhale 2 puffs into the lungs 2 (two) times daily.   calcitRIOL 0.25 MCG capsule Commonly known as:  ROCALTROL Take 0.25 mcg by mouth every Monday, Wednesday, and Friday.   cycloSPORINE 0.05 % ophthalmic emulsion Commonly known as:  RESTASIS Place 1 drop into both eyes 2 (two) times daily.   feeding supplement (GLUCERNA SHAKE) Liqd Take 237 mLs by mouth 3 (three) times daily between meals.   finasteride 5 MG tablet Commonly known as:  PROSCAR Take 5 mg by mouth at bedtime.   hydrOXYzine 25 MG tablet Commonly known as:  ATARAX/VISTARIL Take 1 tablet (25 mg total) by mouth 3 (three) times daily.   insulin aspart 100 UNIT/ML injection Commonly known as:  novoLOG Before each meal 3 times a day, 140-199 - 2 units, 200-250 - 4 units, 251-299 - 6 units,  300-349 - 8 units,  350 or above 10 units. Insulin PEN if approved, provide syringes and needles if needed.   ipratropium-albuterol 0.5-2.5 (3) MG/3ML Soln Commonly known as:  DUONEB Take 3 mLs by nebulization every 8 (eight) hours. Reported on 03/27/2015   magnesium oxide 400 MG tablet Commonly known as:   MAG-OX Take 1 tablet (400 mg total) by mouth daily.   metoprolol  100 MG tablet Commonly known as:  LOPRESSOR Take 1 tablet (100 mg total) by mouth 2 (two) times daily.   montelukast 10 MG tablet Commonly known as:  SINGULAIR Take 1 tablet (10 mg total) by mouth at bedtime.   pilocarpine 2 % ophthalmic solution Commonly known as:  PILOCAR Place 1 drop into both eyes 3 (three) times daily.   Potassium Chloride ER 20 MEQ Tbcr Take 40 mEq by mouth every Monday, Wednesday, and Friday.   predniSONE 5 MG tablet Commonly known as:  DELTASONE Take 5 mg by mouth daily with breakfast. IN CONJUNCTION WITH TWO 1 MG TABLETS TO EQUAL A TOTAL OF 7 MILLIGRAMS   predniSONE 1 MG tablet Commonly known as:  DELTASONE Take 2 mg by mouth daily with breakfast. IN CONJUNCTION WITH ONE 5 MG TABLET TO EQUAL A TOTAL OF 7 MILLIGRAMS   PRESERVISION AREDS 2 PO Take 1 tablet by mouth 2 (two) times daily.   sennosides-docusate sodium 8.6-50 MG tablet Commonly known as:  SENOKOT-S Take 1 tablet by mouth at bedtime. What changed:  how much to take  when to take this   SIMBRINZA 1-0.2 % Susp Generic drug:  Brinzolamide-Brimonidine Place 1 drop into both eyes 2 (two) times daily.   sodium chloride 0.9 % nebulizer solution Take 3 mLs by nebulization every 6 (six) hours as needed for wheezing.   tamsulosin 0.4 MG Caps capsule Commonly known as:  FLOMAX Take 0.4 mg by mouth at bedtime.   traZODone 100 MG tablet Commonly known as:  DESYREL Take 1 tablet (100 mg total) by mouth at bedtime.      Allergies  Allergen Reactions  . Ace Inhibitors Other (See Comments)    Probably nausea and vomiting per patient   . Actonel [Risedronate] Nausea And Vomiting  . Ciprocinonide [Fluocinolone] Other (See Comments)    Probably nausea and vomiting per patient  . Flunisolide Other (See Comments)    Probably nausea and vomiting per patient   . Metformin And Related Other (See Comments)    Probably nausea  and vomiting per patient   . Sertraline Other (See Comments)    Probably nausea and vomiting per patient   . Sulindac Other (See Comments)    Probably nausea and vomiting per patient   . Terazosin Other (See Comments)    Probably nausea and vomiting per patient    Follow-up Information    W Tollie Eth, MD Follow up in 1 month(s).   Specialty:  Cardiology Why:  afib Contact information: [address] [place] [address] [phone]        Cameron Sprang, MD .   Specialty:  Neurology Why:  encephalothapy  Contact information: [address] [place] [id number] [phone]        Haskel Schroeder, MD Follow up in 1 month(s).   Specialty:  Psychiatry Why:  anxiety/agitation Contact information: [place] [id number] [phone]        snf physician Follow up in 1 week(s).   Contact information: for hospital discharge follow up           The results of significant diagnostics from this hospitalization (including imaging, microbiology, ancillary and laboratory) are listed below for reference.    Significant Diagnostic Studies: Mr Lumbar Spine W Wo Contrast  Result Date: 10/03/2015 CLINICAL DATA:  Status post lumbar spine surgery September 06, 2015, recent diagnosis of discitis, sepsis. History of diabetes, hypertension. EXAM: MRI LUMBAR SPINE  WITHOUT AND WITH CONTRAST TECHNIQUE: Multiplanar and multiecho pulse sequences of the lumbar spine were obtained without and with intravenous contrast. CONTRAST:  30mL MULTIHANCE GADOBENATE DIMEGLUMINE 529 MG/ML IV SOLN COMPARISON:  MRI of the lumbar spine August 29, 2015 and intraoperative radiograph of the lumbar spine September 06, 2015 FINDINGS: SEGMENTATION: For the purposes of this report, the last well-formed intervertebral disc will be described as L5-S1. ALIGNMENT: No malalignment.  Maintenance of the lumbar lordosis. VERTEBRAE:Patient is status post redo RIGHT L3  hemilaminectomy. L4 superior endplate Schmorl's node with acute on chronic discogenic endplate changes and enhancement. Progressed L3-4 disc height loss with minimal residual intradiscal residuals bright STIR signal. Moderate to severe L4-5 and L5-S1 disc height loss and subacute on chronic discogenic endplate changes. CONUS MEDULLARIS: Conus medullaris terminates at L1-2 and demonstrates normal morphology and signal characteristics. Cauda equina is normal. No abnormal cord, leptomeningeal enhancement. PARASPINAL AND SOFT TISSUES: 1.4 x 2.5 x 3.3 cm (transverse by AP by CC) rim enhancing fluid collection within the paraspinal central soft tissues, within 2.1 cm the skin surface at the surgical approach. 7 x 15 mm fluid collection within the RIGHT laminectomy surgical bed extending to the dorsal epidural space. DISC LEVELS: L1-2 and L2-3: No disc bulge, canal stenosis nor neural foraminal narrowing. L3-4: Enhancing RIGHT ventral epidural soft tissue with 14 mm of superior migration most compatible with hematoma and/or granulation tissue with 3 mm RIGHT central residual versus recurrent disc extrusion. Status post RIGHT laminectomy. Mild canal stenosis with effaced upper RIGHT lateral recess which may affect the traversing RIGHT L3 nerve. Moderate facet arthropathy. Moderate to severe bilateral neural foraminal narrowing. Encroachment upon the exited L3 nerves is similar. L4-5: Small broad-based disc bulge. Moderate facet arthropathy and ligamentum flavum redundancy. Mild canal stenosis and encroachment upon the exited L4 nerves. Mild to moderate RIGHT, mild LEFT neural foraminal narrowing. L5-S1: 5 mm broad-based central disc protrusion and annular bulging may affect the exited RIGHT greater LEFT L5 nerve. Mild facet arthropathy and ligamentum flavum redundancy without canal stenosis. Moderate to severe RIGHT, mild LEFT neural foraminal narrowing. IMPRESSION: Status post redo interval RIGHT L3 hemilaminectomy for  resection of disc extrusion with minimal residual suspected component, ventral epidural hematoma and/or granulation tissue. 1.4 x 2.5 x 3.3 cm rim enhancing paraspinal fluid collection concerning for abscess. 7 x 15 mm fluid collection in RIGHT laminectomy defect equivocal for abscess, and could represent seroma or possibly hematoma. No convincing evidence of discitis osteomyelitis. Similar degenerative lumbar spine resulting in mild canal stenosis L3-4 and L4-5. Neural foraminal narrowing L3-4 through L5-S1: Moderate to severe at L3-4 and L5-S1. Electronically Signed   By: Elon Alas M.D.   On: 10/03/2015 00:49   Dg Esophagus  Result Date: 10/10/2015 CLINICAL DATA:  Epigastric pain, solid sticking in upper/ mid esophagus EXAM: ESOPHOGRAM/BARIUM SWALLOW TECHNIQUE: Single contrast examination was performed using  thin barium. FLUOROSCOPY TIME:  Radiation Exposure Index (as provided by the fluoroscopic device): 25.1 mGy COMPARISON:  None. FINDINGS: Moderate to severe esophageal dysmotility. Lack of an effective normal primary peristaltic wave. Stasis of contrast in the mid/distal esophagus. No fixed esophageal narrowing or stricture is seen. However, a 13 mm barium tablet was not administered due to esophageal dysmotility. No definite hiatal hernia. Patient could not be evaluated for reflux. IMPRESSION: Moderate to severe esophageal dysmotility. Electronically Signed   By: Julian Hy M.D.   On: 10/10/2015 17:07  US Renal  Result Date: 10/03/2015 CLINICAL DATA:  Acute kidney injury EXAM: RENAL /  URINARY TRACT ULTRASOUND COMPLETE COMPARISON:  02/25/2015 outside abdominal CT [facility]) FINDINGS: Right Kidney: Length: 12 cm. No hydronephrosis. There was a right lower pole enhancing mass on 02/25/2015 CT. Interval percutaneous ablation. No findings in this region. Left Kidney: Length: 12 cm. Echogenicity within normal limits. No mass or hydronephrosis visualized. Bladder: Apparent dependent  echoes in the bladder appear artifactual. Moderate distension of the bladder without wall thickening. IMPRESSION: 1. No hydronephrosis. 2. No findings in region of treated right renal cell carcinoma. Electronically Signed   By: Monte Fantasia M.D.   On: 10/03/2015 03:46   Ct Aspiration  Result Date: 10/04/2015 INDICATION: History of redo right L3 hemilaminectomy, now with indeterminate fluid collection about the posterior aspect of the operative site. Please perform CT-guided aspiration for diagnostic purposes. EXAM: CT GUIDANCE NEEDLE PLACEMENT COMPARISON:  Fluoroscopic guided L3-L4 disc aspiration - 08/30/2015 MEDICATIONS: The patient is currently admitted to the hospital and receiving intravenous antibiotics. The antibiotics were administered within an appropriate time frame prior to the initiation of the procedure. ANESTHESIA/SEDATION: None CONTRAST:  None COMPLICATIONS: None immediate. PROCEDURE: Informed written consent was obtained from the patient's white after a discussion of the risks, benefits and alternatives to treatment. The patient was placed left lateral decubitus on the CT gantry and a pre procedural CT was performed re-demonstrating the known abscess/fluid collection within the subcutaneous tissues posterior to the L3 operative site. The procedure was planned. A timeout was performed prior to the initiation of the procedure. The skin overlying the posterior aspect of the lower back was prepped and draped in the usual sterile fashion. The overlying soft tissues were anesthetized with 1% lidocaine with epinephrine. Appropriate trajectory was planned with the use of a 22 gauge spinal needle. An 18 gauge trocar needle was advanced into the fluid collection. Appropriate positioning was confirmed with CT imaging. Approximately 2 cc of blood tinged, slightly cloudy fluid was aspirated. All aspirated fluid was capped and sent to the laboratory for analysis. A dressing was placed. The patient tolerated  the procedure well without immediate post procedural complication. IMPRESSION: Title successful CT guided aspiration of approximately 2 cc of blood tinged, slightly cloudy fluid from indeterminate fluid collection posterior to the L3 operative site. Samples were sent to the laboratory as requested by the ordering clinical team. Electronically Signed   By: Sandi Mariscal M.D.   On: 10/04/2015 08:29   Dg Chest Port 1 View  Result Date: 10/04/2015 CLINICAL DATA:  Follow-up pneumonia, per family. EXAM: PORTABLE CHEST 1 VIEW COMPARISON:  Chest x-rays dated 10/01/2015 and 08/05/2015 FINDINGS: Cardiomegaly is stable. Overall cardiomediastinal silhouette is stable in size and configuration. Atherosclerotic changes again noted at the aortic arch. Median sternotomy wires appear intact and stable in alignment, with presumed surgical changes of CABG. Increased opacity is seen within the right upper lung, possibly accentuated by oblique patient positioning. Probable mild interstitial edema bilaterally, similar to older exams. No pleural effusion or pneumothorax seen IMPRESSION: 1. Increased opacity within the right upper lung, most likely confluent edema, possibly accentuated by the oblique patient positioning. If febrile, this could represent developing pneumonia. 2. Cardiomegaly and mild bilateral interstitial edema suggesting mild CHF/volume overload. Electronically Signed   By: Franki Cabot M.D.   On: 10/04/2015 19:38   Dg Chest Port 1 View  Result Date: 10/01/2015 CLINICAL DATA:  Fever EXAM: PORTABLE CHEST 1 VIEW COMPARISON:  Aug 05, 2015 FINDINGS: There is airspace consolidation throughout the left lower lobe with small left effusion. There is a  small calcified granuloma in the right base. There is slight atelectasis in the right base. There is cardiomegaly pulmonary vascularity within normal limits. There is atherosclerotic calcification in the aorta. Patient is status post coronary artery bypass grafting. No  pneumothorax. No adenopathy. No apparent bone lesions. IMPRESSION: Left lower lobe airspace consolidation consistent with pneumonia. Small left effusion. Mild right base atelectasis. Calcified granuloma right base. Stable cardiac prominence. There is aortic atherosclerosis. Followup PA and lateral chest radiographs recommended in 3-4 weeks following trial of antibiotic therapy to ensure resolution and exclude underlying malignancy. Electronically Signed   By: Lowella Grip III M.D.   On: 10/01/2015 21:53    Microbiology: No results found for this or any previous visit (from the past 240 hour(s)).   Labs: Basic Metabolic Panel:  Recent Labs Lab 10/09/15 0631 10/10/15 0504 10/11/15 0545 10/12/15 0422 10/13/15 0550  NA 144 143 141 140 140  K 3.1* 3.5 3.7 2.9* 3.7  CL 107 108 106 106 106  CO2 24 24 23 23 26   GLUCOSE 117* 117* 166* 121* 193*  BUN 10 8 10 7 10   CREATININE 1.70* 1.64* 1.53* 1.51* 1.52*  CALCIUM 8.6* 8.4* 8.6* 8.3* 8.4*  MG  --  1.4* 2.2  --  1.8   Liver Function Tests:  Recent Labs Lab 10/10/15 0504 10/11/15 0545 10/12/15 0422 10/13/15 0550  AST 29 37 21 32  ALT 23 20 23 22   ALKPHOS 132* 139* 118 112  BILITOT 1.6* 1.9* 1.3* 1.3*  PROT 5.1* 5.6* 4.9* 4.9*  ALBUMIN 2.3* 2.5* 2.3* 2.4*   No results for input(s): LIPASE, AMYLASE in the last 168 hours. No results for input(s): AMMONIA in the last 168 hours. CBC:  Recent Labs Lab 10/08/15 0534 10/11/15 0545 10/12/15 0422  WBC 5.4 6.4 5.5  HGB 10.2* 10.2* 9.0*  HCT 32.1* 41.2 28.6*  MCV 92.2 105.9* 90.8  PLT 265 233 283   Cardiac Enzymes: No results for input(s): CKTOTAL, CKMB, CKMBINDEX, TROPONINI in the last 168 hours. BNP: BNP (last 3 results)  Recent Labs  08/03/15 1203 10/01/15 2147  BNP 265.8* 1,601.7*    ProBNP (last 3 results) No results for input(s): PROBNP in the last 8760 hours.  CBG:  Recent Labs Lab 10/13/15 0822 10/13/15 1232 10/13/15 1727 10/13/15 2120 10/14/15 0824   GLUCAP 188* 249* 353* 316* 171*       Signed:  Clydine Parkison MD, PhD  [facility] 10/14/2015, 11:10 AM

## 2015-10-13 NOTE — Progress Notes (Signed)
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

## 2015-10-13 NOTE — Progress Notes (Signed)
PROGRESS NOTE    ULICES SASSE  [id number] DOB: 17-Aug-1940 DOA: 10/01/2015 PCP: No primary care provider on file.    Brief Narrative: Jacob [name] is a 75 y.o. male with medical history significant of CAD, CABG, IDDM, COPD, dyslipidemia, RCC, PMR, HTN, CKD, OSA , lumbar stenosis s/p correction, and recently was hospitalized with discitis,  was on rocephin and vancomycin till 7/13, presents for altered mental status. On arrival to ED, he was found to be febrile, tachycardic, hypotensive, in a fib with RVR, . He was admitted to step down for further management.  Cardiology and PCCM consulted.  PCCM signed off recommending a repeat MRI of the lumbar spine. MRI spine showed fluid collection , suspect abscess, NS and IR consulted for further eval.  CT guided aspiration of the fluid collection done on 7/20 and cultures are pending. No organisms seen.  ID on board to help [name] with antibiotics.  PT repeat eval requested to see if he is eligible for inpatient rehab.  Assessment & Plan:   Principal Problem:   Sepsis ([place]) Active Problems:   Diabetes mellitus, type 2 ([place])   CHF with left ventricular diastolic dysfunction, NYHA class 2 (HCC)   Acute encephalopathy   Elevated troponin   Acute kidney injury superimposed on chronic kidney disease (HCC)   Atrial fibrillation with RVR (HCC)   Hypotension   Abscess   AKI (acute kidney injury) ([place])  Sepsis of unclear source possibly from health care associated pneumonia vs recurrent discitis.  CXR shows left lobe air space consolidation.  MRI of the lumbar spine ordered to further evaluate the discitis.  It showed 1.4 x 2.5 x 3.3 cm rim enhancing paraspinal fluid collection concerning for abscess. 7 x 15 mm fluid collection in RIGHT laminectomy defect equivocal for abscess, and could represent seroma or possibly hematoma. IR consulted and he underwent CT guided aspiration of the fluid collection , the aspirate is negative for organisms, few wbc  seen and cultures have been negative so far. Requested neuro surgery consult in view of the abnormal MRI, suggested probably not an abscess as the cultures have been negative.  Of note he just completed 4 weeks of vancomycin and rocephin on 7/13.  He was started on IV vancomycin and IV zosyn, IV azithromycin on admission , later transitioned to zosyn alone, which was discontinued on 7/24 as all the cultures have been negative and a repeat CXR did not show any consolidation. monitor off antibiotics Lactic acid is normalized,  Blood cultures negative so far.  He is afebrile and repeat labs to check WBC count, show normal wbc count.    Acute vs subacute encephalopathy with bouts of agitation, labile mood, crying at times, anxiety/depression: EEG is sub optimal study as pt would not stay still , but it  Showed diffuse cerebral dysfunction, non specific in nature. , no epileptiform discharges seen.  MRI was ordered, but patient could not tolerate it even with ativan. We discontinued the test after discussing with the family.  Last mri brain from 08/29/2015 no acute findings After talking to the families, these symptoms has been wax and wean in the last few months, ammonia/tsh/b12/rpr/hiv negative, he was seen by neurology for this on 7/17 who recommended minimizing sedative meds and referred to have psych evaluation,  Psych consulted on 7/28 who recommended trial of hydroxyzine for anxiety and trazodone for insomnia Improving, has been calm during day time since 7/28, still has sun downing which is chronic per wife  PAF/Atrial fibrillation with RVR:  Serial troponins are elevated and peaked at 0.9 , cardiology Dr Wynonia Lawman consulted Currently on amiodarone and beta blocker, Started the patient on Eliquis Converted to NSR Patient will not keep tele on, d/c tele on 7/29   Acute kidney injury on STAGE 3 CKD with solitary kidney (h/o R renal cell carcinoma s/p resection in 07/2015); Possibly from  sepsis, dehydration.   US renal does not show any hydronephrosis.   Pt is incontinent and urine output couldn't be measure appropriately.  UA shows SMALL LEUKOCYTOSIS with few bacteria and wbc.  Urine cultures done , show yeast, started diflucan due to underline immunosuppression Cr improving   IDDM: a1c 6.8 in 08/2015 SLP  EVAL recommended restarting  Dysphagia 3 diet.  CBG (last 3)   Recent Labs  10/12/15 1737 10/12/15 2202 10/13/15 0822  GLUCAP 240* 237* 188*  resume SSI.    Nutrition: Carb modified diet with supplementations.   Hypernatremia: Probably from dehydration and Poor po intake.  Resolved after fluid hydration.    Leukopenia,  Unclear etiology. Probably from IV antibiotics or sepsis.  Resolved.  Anemia: Baseline hemoglobin between 9 to 10,  Stable. Monitor.   COPD: No wheezing heard, resume Symbicort and duo nebs as needed.   PMR:  On  Maintenance dose of prednisone, continue the same.      Hypokalemia/hypomagnesemia: , replace prn to keep k>4, mag >2    Hypertension: suboptimal.  Add prn hydralazine.   Poor appetite, Difficulty swallowing vs patient does not want to swallow,DG  Esophagus showed moderate to severe esophageal dysmotility, I have updated daughter and recommended patient to have  Smaller meals and multiple times a day, soft diet recommended trial of megace for appetite stimulation, nutrition supplement  FTT; patient had multiple hospitalization with multiple surgeries and multiple infections in the last few months, has not been getting significant improvement. Will need snf  DVT prophylaxis: sq heparin. Code Status: do not intubate only. Family Communication: talked to daughter and wife over the phone o n 7/26 , talked to wife and daughter in person on 7/27, talked to daughter over the phone on 7/28, wife over the phone on 7/30 Disposition Plan: need snf at discharge,  Family reports not happy with the care at previous SNF     Consultants:   Cardiology Dr Wynonia Lawman.  PCCM  ID Dr Linus Salmons  IR  Neuro surgery. Dr Parks Neptune.  Procedures:  MRI LUMBAR SPINE.  EEG.  Antimicrobials:  Vancomycin 7/19 to 7/20 Zosyn 7/19- 7/24  Azithromycin 7/19  Subjective: Calm and  pleasant, denies any discomfort,  he is more alert and making meaningful conversation No crying spells, no agitation today, still has sun downing  Objective: Vitals:   10/12/15 2202 10/13/15 0449 10/13/15 0451 10/13/15 0935  BP: (!) 156/43 (!) 179/81    Pulse: 80 67 69   Resp: 18 17    Temp: 97.6 F (36.4 C) 97.6 F (36.4 C)    TempSrc:      SpO2: 100% 97% 97% 96%  Weight:      Height:        Intake/Output Summary (Last 24 hours) at 10/13/15 1053 Last data filed at 10/13/15 0654  Gross per 24 hour  Intake           808.33 ml  Output                0 ml  Net  808.33 ml   Filed Weights   10/06/15 0449 10/07/15 0426 10/07/15 1629  Weight: 87.7 kg (193 lb 5.5 oz) 88.3 kg (194 lb 10.7 oz) 89 kg (196 lb 4.8 oz)    Examination:  General exam: more alert and interactive, very pleasant ,  NAD  Respiratory system: diminished at bases, no wheezing or rhonchi.  Cardiovascular system: regular , s1s2, rate controlled.  Gastrointestinal system: Abdomen is nondistended, soft and nontender. No organomegaly or masses felt. Normal bowel sounds heard. Central nervous system: more alert, oriented to person and place, know it is 2017 Skin: No rashes, lesions or ulcers     Data Reviewed: I have personally reviewed following labs and imaging studies  CBC:  Recent Labs Lab 10/07/15 0528 10/08/15 0534 10/11/15 0545 10/12/15 0422  WBC 5.0 5.4 6.4 5.5  HGB 9.0* 10.2* 10.2* 9.0*  HCT 28.2* 32.1* 41.2 28.6*  MCV 90.1 92.2 105.9* 90.8  PLT 259 265 233 [id number]   Basic Metabolic Panel:  Recent Labs Lab 10/07/15 0528  10/09/15 0631 10/10/15 0504 10/11/15 0545 10/12/15 0422 10/13/15 0550  NA 144  < > 144 143 141 140 140  K  3.0*  < > 3.1* 3.5 3.7 2.9* 3.7  CL 110  < > 107 108 106 106 106  CO2 26  < > 24 24 23 23 26   GLUCOSE 121*  < > 117* 117* 166* 121* 193*  BUN 13  < > 10 8 10 7 10   CREATININE 1.85*  < > 1.70* 1.64* 1.53* 1.51* 1.52*  CALCIUM 8.3*  < > 8.6* 8.4* 8.6* 8.3* 8.4*  MG 1.4*  --   --  1.4* 2.2  --  1.8  < > = values in this interval not displayed. GFR: Estimated Creatinine Clearance: 44.7 mL/min (by C-G formula based on SCr of 1.52 mg/dL). Liver Function Tests:  Recent Labs Lab 10/10/15 0504 10/11/15 0545 10/12/15 0422 10/13/15 0550  AST 29 37 21 32  ALT 23 20 23 22   ALKPHOS 132* 139* 118 112  BILITOT 1.6* 1.9* 1.3* 1.3*  PROT 5.1* 5.6* 4.9* 4.9*  ALBUMIN 2.3* 2.5* 2.3* 2.4*   No results for input(s): LIPASE, AMYLASE in the last 168 hours. No results for input(s): AMMONIA in the last 168 hours. Coagulation Profile: No results for input(s): INR, PROTIME in the last 168 hours. Cardiac Enzymes: No results for input(s): CKTOTAL, CKMB, CKMBINDEX, TROPONINI in the last 168 hours. BNP (last 3 results) No results for input(s): PROBNP in the last 8760 hours. HbA1C: No results for input(s): HGBA1C in the last 72 hours. CBG:  Recent Labs Lab 10/12/15 0808 10/12/15 1205 10/12/15 1737 10/12/15 2202 10/13/15 0822  GLUCAP 132* 215* 240* 237* 188*   Lipid Profile: No results for input(s): CHOL, HDL, LDLCALC, TRIG, CHOLHDL, LDLDIRECT in the last 72 hours. Thyroid Function Tests: No results for input(s): TSH, T4TOTAL, FREET4, T3FREE, THYROIDAB in the last 72 hours. Anemia Panel: No results for input(s): VITAMINB12, FOLATE, FERRITIN, TIBC, IRON, RETICCTPCT in the last 72 hours. Sepsis Labs: No results for input(s): PROCALCITON, LATICACIDVEN in the last 168 hours.  Recent Results (from the past 240 hour(s))  Aerobic/Anaerobic Culture (surgical/deep wound)     Status: None   Collection Time: 10/03/15  5:00 PM  Result Value Ref Range Status   Specimen Description ABSCESS BACK  Final    Special Requests NONE  Final   Gram Stain   Final    FEW WBC PRESENT,BOTH PMN AND MONONUCLEAR NO ORGANISMS SEEN  Culture No growth aerobically or anaerobically.  Final   Report Status 10/08/2015 FINAL  Final         Radiology Studies: No results found.      Scheduled Meds: . amiodarone  200 mg Oral Daily  . antiseptic oral rinse  7 mL Mouth Rinse q12n4p  . apixaban  5 mg Oral BID  . chlorhexidine  15 mL Mouth Rinse BID  . cycloSPORINE  1 drop Both Eyes BID  . feeding supplement (GLUCERNA SHAKE)  237 mL Oral TID BM  . finasteride  5 mg Oral QHS  . fluconazole (DIFLUCAN) IV  100 mg Intravenous Q24H  . hydrOXYzine  25 mg Oral TID  . insulin aspart  0-15 Units Subcutaneous TID WC  . megestrol  200 mg Oral Daily  . metoprolol tartrate  50 mg Oral BID  . mometasone-formoterol  2 puff Inhalation BID  . pilocarpine  1 drop Both Eyes TID  . predniSONE  7 mg Oral Q breakfast  . sodium chloride flush  3 mL Intravenous Q12H  . tamsulosin  0.4 mg Oral QHS  . traZODone  100 mg Oral QHS   Continuous Infusions: . sodium chloride 50 mL/hr at 10/12/15 0549     LOS: 11 days    Time spent: 30 minutes. More than 50% time spent on updating and explaining plan of care to family     Courtnay Petrilla, MD PhD [facility] Pager [phone]  If 7PM-7AM, please contact night-coverage [email or web address] Password [email or web address] 10/13/2015, 10:53 AM

## 2015-10-14 ENCOUNTER — Inpatient Hospital Stay: Payer: Self-pay | Admitting: Internal Medicine

## 2015-10-14 DIAGNOSIS — A419 Sepsis, unspecified organism: Secondary | ICD-10-CM | POA: Diagnosis not present

## 2015-10-14 DIAGNOSIS — I10 Essential (primary) hypertension: Secondary | ICD-10-CM | POA: Diagnosis not present

## 2015-10-14 DIAGNOSIS — I5030 Unspecified diastolic (congestive) heart failure: Secondary | ICD-10-CM | POA: Diagnosis not present

## 2015-10-14 DIAGNOSIS — L0291 Cutaneous abscess, unspecified: Secondary | ICD-10-CM | POA: Diagnosis not present

## 2015-10-14 DIAGNOSIS — R1310 Dysphagia, unspecified: Secondary | ICD-10-CM | POA: Diagnosis not present

## 2015-10-14 DIAGNOSIS — H04129 Dry eye syndrome of unspecified lacrimal gland: Secondary | ICD-10-CM | POA: Diagnosis not present

## 2015-10-14 DIAGNOSIS — I959 Hypotension, unspecified: Secondary | ICD-10-CM | POA: Diagnosis not present

## 2015-10-14 DIAGNOSIS — M4646 Discitis, unspecified, lumbar region: Secondary | ICD-10-CM | POA: Diagnosis not present

## 2015-10-14 DIAGNOSIS — N19 Unspecified kidney failure: Secondary | ICD-10-CM | POA: Diagnosis not present

## 2015-10-14 DIAGNOSIS — N179 Acute kidney failure, unspecified: Secondary | ICD-10-CM | POA: Diagnosis not present

## 2015-10-14 DIAGNOSIS — F411 Generalized anxiety disorder: Secondary | ICD-10-CM | POA: Diagnosis not present

## 2015-10-14 DIAGNOSIS — F419 Anxiety disorder, unspecified: Secondary | ICD-10-CM | POA: Diagnosis not present

## 2015-10-14 DIAGNOSIS — R2681 Unsteadiness on feet: Secondary | ICD-10-CM | POA: Diagnosis not present

## 2015-10-14 DIAGNOSIS — R2689 Other abnormalities of gait and mobility: Secondary | ICD-10-CM | POA: Diagnosis not present

## 2015-10-14 DIAGNOSIS — I509 Heart failure, unspecified: Secondary | ICD-10-CM | POA: Diagnosis not present

## 2015-10-14 DIAGNOSIS — Z981 Arthrodesis status: Secondary | ICD-10-CM | POA: Diagnosis not present

## 2015-10-14 DIAGNOSIS — G9340 Encephalopathy, unspecified: Secondary | ICD-10-CM | POA: Diagnosis not present

## 2015-10-14 DIAGNOSIS — E119 Type 2 diabetes mellitus without complications: Secondary | ICD-10-CM | POA: Diagnosis not present

## 2015-10-14 DIAGNOSIS — E785 Hyperlipidemia, unspecified: Secondary | ICD-10-CM | POA: Diagnosis not present

## 2015-10-14 DIAGNOSIS — I4891 Unspecified atrial fibrillation: Secondary | ICD-10-CM | POA: Diagnosis not present

## 2015-10-14 DIAGNOSIS — K5900 Constipation, unspecified: Secondary | ICD-10-CM | POA: Diagnosis not present

## 2015-10-14 DIAGNOSIS — R1314 Dysphagia, pharyngoesophageal phase: Secondary | ICD-10-CM | POA: Diagnosis not present

## 2015-10-14 DIAGNOSIS — R41841 Cognitive communication deficit: Secondary | ICD-10-CM | POA: Diagnosis not present

## 2015-10-14 DIAGNOSIS — N390 Urinary tract infection, site not specified: Secondary | ICD-10-CM | POA: Diagnosis not present

## 2015-10-14 DIAGNOSIS — M6281 Muscle weakness (generalized): Secondary | ICD-10-CM | POA: Diagnosis not present

## 2015-10-14 DIAGNOSIS — L02212 Cutaneous abscess of back [any part, except buttock]: Secondary | ICD-10-CM | POA: Diagnosis not present

## 2015-10-14 LAB — GLUCOSE, CAPILLARY
Glucose-Capillary: 171 mg/dL — ABNORMAL HIGH (ref 65–99)
Glucose-Capillary: 199 mg/dL — ABNORMAL HIGH (ref 65–99)

## 2015-10-14 MED ORDER — METOPROLOL TARTRATE 100 MG PO TABS: 100.0000 mg | ORAL_TABLET | Freq: Two times a day (BID) | ORAL | Status: DC

## 2015-10-14 MED ORDER — METOPROLOL TARTRATE 50 MG PO TABS
50.0000 mg | ORAL_TABLET | Freq: Once | ORAL | Status: AC
Start: 2015-10-14 — End: 2015-10-14
  Administered 2015-10-14: 50 mg via ORAL
  Filled 2015-10-14: qty 1

## 2015-10-14 NOTE — Clinical Social Work Placement (Signed)
   CLINICAL SOCIAL WORK PLACEMENT  NOTE  Date:  10/14/2015  Patient Details  Name: BARNETT PRIDEAUX MRN: [id number] Date of Birth: 12-20-1940  Clinical Social Work is seeking post-discharge placement for this patient at the [place] level of care (*CSW will initial, date and re-position this form in  chart as items are completed):  Yes   Patient/family provided with [place] Work Department's list of facilities offering this level of care within the geographic area requested by the patient (or if unable, by the patient's family).  Yes   Patient/family informed of their freedom to choose among providers that offer the needed level of care, that participate in Medicare, Medicaid or managed care program needed by the patient, have an available bed and are willing to accept the patient.  Yes   Patient/family informed of [place]'s ownership interest in [facility] and [facility], as well as of the fact that they are under no obligation to receive care at these facilities.  PASRR submitted to EDS on       PASRR number received on       Existing PASRR number confirmed on 10/09/15     FL2 transmitted to all facilities in geographic area requested by pt/family on 10/09/15     FL2 transmitted to all facilities within larger geographic area on       Patient informed that his/her managed care company has contracts with or will negotiate with certain facilities, including the following:        Yes   Patient/family informed of bed offers received.  Patient chooses bed at [facility]     Physician recommends and patient chooses bed at      Patient to be transferred to [facility] on 10/14/15.  Patient to be transferred to facility by [facility]     Patient family notified on 10/14/15 of transfer.  Name of family member notified:  Son at bedside     PHYSICIAN Please sign FL2, Please prepare priority discharge summary, including medications      Additional Comment:    _______________________________________________ Benard Halsted, [place] 10/14/2015, 1:25 PM

## 2015-10-14 NOTE — Progress Notes (Signed)
Physical Therapy Treatment Patient Details Name: Jacob [name] MRN: [id number] DOB: [birth year] Today's Date: 10/14/2015    History of Present Illness Pt is a 75 y/o M from [facility] facility who presented with AMS.  Pt was recently hospitalized with discitis. MRI spine on admission showed fluid collection and fluid collection done 7/20. Admitting dx: sepsis of unclear source. Pt's PMH includes polymyalgia rheumatica, COPD, anemia, anxiety, hepatitis, CABG, lumbar laminectomy/decompression microdiscectomy 09/06/15.      PT Comments    Pt with improved ability to participate in PT this date. Pt with no dizziness or nystagmus with transfers this date either. Pt con't to be confused and required assistx2 for transfers and ambulation. Pt remains appropriate for SNF upon d/c for maximal functional recovery.   Follow Up Recommendations  SNF;Supervision/Assistance - 24 hour     Equipment Recommendations       Recommendations for Other Services       Precautions / Restrictions Precautions Precautions: Fall Restrictions Weight Bearing Restrictions: No    Mobility  Bed Mobility Overal bed mobility: Needs Assistance Bed Mobility: Supine to Sit;Rolling Rolling: Min assist   Supine to sit: Min assist;Mod assist     General bed mobility comments: max directional v/c's, used bed rail, tactile directional cues, modA for trunk elevation. pt with positive lean to the R  Transfers Overall transfer level: Needs assistance Equipment used: Rolling walker (2 wheeled) Transfers: Sit to/from Stand Sit to Stand: Mod assist         General transfer comment: pt with no dizziness at this time. max v/c's for safe hand placement  Ambulation/Gait Ambulation/Gait assistance: Mod assist;+2 safety/equipment Ambulation Distance (Feet): 10 Feet Assistive device: Rolling walker (2 wheeled) Gait Pattern/deviations: Step-to pattern Gait velocity: slow, shuffled Gait velocity interpretation:  Below normal speed for age/gender General Gait Details: modA for walker management, max directional v/c's for sequencing, pt quickly fatigued, 2nd person for chair follow   Stairs            Wheelchair Mobility    Modified Rankin (Stroke Patients Only)       Balance Overall balance assessment: Needs assistance Sitting-balance support: Bilateral upper extremity supported Sitting balance-Leahy Scale: Poor Sitting balance - Comments: pt unable to maintain midline, pt with positive R lateral lean requiring maxA to re-orient to midline Postural control: Right lateral lean   Standing balance-Leahy Scale: Poor                      Cognition Arousal/Alertness: Awake/alert Behavior During Therapy: WFL for tasks assessed/performed Overall Cognitive Status: Impaired/Different from baseline Area of Impairment: Orientation;Safety/judgement Orientation Level: Disoriented to;Time;Situation   Memory: Decreased short-term memory   Safety/Judgement: Decreased awareness of safety;Decreased awareness of deficits     General Comments: pt aware he is at [facility] but thinks the [place] is the [place] and that his house is righ across the street. pt re-oriented to area    Exercises      General Comments        Pertinent Vitals/Pain Pain Assessment: No/denies pain    Home Living Family/patient expects to be discharged to:: Skilled nursing facility               Additional Comments: pt was at [facility] but plans on going to [facility]    Prior Function Level of Independence: Needs assistance          PT Goals (current goals can now be found in the care plan section) Acute Rehab  PT Goals Patient Stated Goal: to sit in chair by the windo Progress towards PT goals: Progressing toward goals    Frequency  Min 2X/week    PT Plan Current plan remains appropriate    Co-evaluation             End of Session Equipment Utilized During Treatment: Gait  belt Activity Tolerance: Patient limited by fatigue Patient left: in chair;with call bell/phone within reach;with chair alarm set     Time: [id number] PT Time Calculation (min) (ACUTE ONLY): 23 min  Charges:  $Gait Training: 8-22 mins $Therapeutic Activity: 8-22 mins                    G Codes:      Kingsley Callander 10/14/2015, 2:22 PM   Kittie Plater, PT, DPT Pager #: [phone] Office #: [phone]

## 2015-10-14 NOTE — Progress Notes (Signed)
Patient will DC to: [facility] Anticipated DC date: 10/14/15 Family notified: Son at bedside Transport by: Domenica Reamer   Per MD patient ready for DC to [facility]. RN, patient, patient's family, and facility notified of DC. RN given number for report. DC packet on chart. Ambulance transport requested for patient.   CSW signing off.  Cedric Fishman, [place] Social Worker [phone]

## 2015-10-14 NOTE — Progress Notes (Deleted)
CRITICAL VALUE ALERT  Critical value received:  troponin  Date of notification:  10/14/15  Time of notification:  0455  Critical value read back: yes  Nurse who received alert:  Bing Plume  MD notified (1st page):  hadack  Time of first page:  0535  MD notified (2nd page): not yet  Time of second page: not yet  Responding MD:  Awaiting response  Time MD responded: still waiting

## 2015-10-14 NOTE — Progress Notes (Signed)
Facility called back and report was given to the supervisor.

## 2015-10-14 NOTE — Progress Notes (Addendum)
Patient was discharged to [facility]) by MD order; discharged instructions  review and sent to facility with care notes and prescriptions; IV DIC; facility was called for report but nobody was available to take report. RN left the phone number; patient will be transported to facility via EMS.

## 2015-10-15 DIAGNOSIS — R410 Disorientation, unspecified: Secondary | ICD-10-CM | POA: Diagnosis not present

## 2015-10-15 DIAGNOSIS — G9340 Encephalopathy, unspecified: Secondary | ICD-10-CM | POA: Diagnosis not present

## 2015-10-15 DIAGNOSIS — G8929 Other chronic pain: Secondary | ICD-10-CM | POA: Diagnosis not present

## 2015-10-15 DIAGNOSIS — I959 Hypotension, unspecified: Secondary | ICD-10-CM | POA: Diagnosis not present

## 2015-10-15 DIAGNOSIS — N179 Acute kidney failure, unspecified: Secondary | ICD-10-CM | POA: Diagnosis not present

## 2015-10-15 DIAGNOSIS — E119 Type 2 diabetes mellitus without complications: Secondary | ICD-10-CM | POA: Diagnosis not present

## 2015-10-15 DIAGNOSIS — F419 Anxiety disorder, unspecified: Secondary | ICD-10-CM | POA: Diagnosis not present

## 2015-10-15 DIAGNOSIS — L02212 Cutaneous abscess of back [any part, except buttock]: Secondary | ICD-10-CM | POA: Diagnosis not present

## 2015-10-15 DIAGNOSIS — R1314 Dysphagia, pharyngoesophageal phase: Secondary | ICD-10-CM | POA: Diagnosis not present

## 2015-10-15 DIAGNOSIS — I10 Essential (primary) hypertension: Secondary | ICD-10-CM | POA: Diagnosis not present

## 2015-10-15 DIAGNOSIS — H04129 Dry eye syndrome of unspecified lacrimal gland: Secondary | ICD-10-CM | POA: Diagnosis not present

## 2015-10-15 DIAGNOSIS — M353 Polymyalgia rheumatica: Secondary | ICD-10-CM | POA: Diagnosis not present

## 2015-10-15 DIAGNOSIS — K5900 Constipation, unspecified: Secondary | ICD-10-CM | POA: Diagnosis not present

## 2015-10-15 DIAGNOSIS — R2681 Unsteadiness on feet: Secondary | ICD-10-CM | POA: Diagnosis not present

## 2015-10-15 DIAGNOSIS — R1310 Dysphagia, unspecified: Secondary | ICD-10-CM | POA: Diagnosis not present

## 2015-10-15 DIAGNOSIS — Z981 Arthrodesis status: Secondary | ICD-10-CM | POA: Diagnosis not present

## 2015-10-15 DIAGNOSIS — A419 Sepsis, unspecified organism: Secondary | ICD-10-CM | POA: Diagnosis not present

## 2015-10-15 DIAGNOSIS — I4891 Unspecified atrial fibrillation: Secondary | ICD-10-CM | POA: Diagnosis not present

## 2015-10-15 DIAGNOSIS — M4646 Discitis, unspecified, lumbar region: Secondary | ICD-10-CM | POA: Diagnosis not present

## 2015-10-15 DIAGNOSIS — N19 Unspecified kidney failure: Secondary | ICD-10-CM | POA: Diagnosis not present

## 2015-10-15 DIAGNOSIS — I509 Heart failure, unspecified: Secondary | ICD-10-CM | POA: Diagnosis not present

## 2015-10-15 DIAGNOSIS — R41841 Cognitive communication deficit: Secondary | ICD-10-CM | POA: Diagnosis not present

## 2015-10-15 DIAGNOSIS — D649 Anemia, unspecified: Secondary | ICD-10-CM | POA: Diagnosis not present

## 2015-10-15 DIAGNOSIS — R2689 Other abnormalities of gait and mobility: Secondary | ICD-10-CM | POA: Diagnosis not present

## 2015-10-15 DIAGNOSIS — E785 Hyperlipidemia, unspecified: Secondary | ICD-10-CM | POA: Diagnosis not present

## 2015-10-15 DIAGNOSIS — M6281 Muscle weakness (generalized): Secondary | ICD-10-CM | POA: Diagnosis not present

## 2015-10-15 DIAGNOSIS — I5030 Unspecified diastolic (congestive) heart failure: Secondary | ICD-10-CM | POA: Diagnosis not present

## 2015-10-15 NOTE — Progress Notes (Deleted)
CRITICAL VALUE ALERT  Critical value received:

## 2015-10-15 NOTE — Progress Notes (Deleted)
CRITICAL VALUE ALERT  Critical value received:   Date of notification:    Time of notification:    Critical value read back:   Nurse who received alert:   MD notified (1st page):    Time of first page:    MD notified (2nd page):   Time of second page:   Responding MD:   Time MD responded:

## 2015-10-21 ENCOUNTER — Telehealth: Payer: Self-pay | Admitting: Neurology

## 2015-10-21 NOTE — Telephone Encounter (Signed)
Patient daughter called and needs to talk to someone about her father favoring rt side please call debra at [phone].

## 2015-10-22 ENCOUNTER — Telehealth: Payer: Self-pay | Admitting: *Deleted

## 2015-10-22 NOTE — Telephone Encounter (Signed)
Patient's daughter called stating that her dad is favoring his right side and has never been like that before.  She is wondering if he could have had a stroke.  Do we need to order anything in order to find out if it was a stroke?  Please advise.

## 2015-10-22 NOTE — Telephone Encounter (Signed)
Note sent to Dr. Delice Lesch to find out if anything needs to be done.

## 2015-10-22 NOTE — Telephone Encounter (Signed)
Gave patient's daughter instructions to call neurosurgeon or take to ER if arm and leg are both weak.  She is now wondering if we could do EEG.  They tried to do one in the hospital but he would not be still.  Please advise.

## 2015-10-22 NOTE — Telephone Encounter (Signed)
Pls let her know I reviewed notes from hospital and the physical therapist had noted he was favoring right side on their evaluation. If this is worse, would be more concerned about his spine giving his an issue again and recommend calling his neurosurgeon. If weaker on right arm AND leg, recommend going to ER. THanks

## 2015-10-25 ENCOUNTER — Other Ambulatory Visit: Payer: Self-pay | Admitting: Emergency Medicine

## 2015-10-25 NOTE — Telephone Encounter (Signed)
Pls schedule patient for routine EEG and let daughter know. Thanks!

## 2015-10-25 NOTE — Telephone Encounter (Signed)
Spoke to wife to schedule EEG she states she will have daughter to call back to schedule.

## 2015-10-29 DIAGNOSIS — E119 Type 2 diabetes mellitus without complications: Secondary | ICD-10-CM | POA: Diagnosis not present

## 2015-10-29 DIAGNOSIS — H8110 Benign paroxysmal vertigo, unspecified ear: Secondary | ICD-10-CM | POA: Diagnosis not present

## 2015-10-29 DIAGNOSIS — R5381 Other malaise: Secondary | ICD-10-CM | POA: Diagnosis not present

## 2015-10-29 DIAGNOSIS — R410 Disorientation, unspecified: Secondary | ICD-10-CM | POA: Diagnosis not present

## 2015-10-29 DIAGNOSIS — I4891 Unspecified atrial fibrillation: Secondary | ICD-10-CM | POA: Diagnosis not present

## 2015-10-29 DIAGNOSIS — N390 Urinary tract infection, site not specified: Secondary | ICD-10-CM | POA: Diagnosis not present

## 2015-10-29 DIAGNOSIS — J449 Chronic obstructive pulmonary disease, unspecified: Secondary | ICD-10-CM | POA: Diagnosis not present

## 2015-10-30 ENCOUNTER — Ambulatory Visit (INDEPENDENT_AMBULATORY_CARE_PROVIDER_SITE_OTHER): Payer: PPO | Admitting: Neurology

## 2015-10-30 DIAGNOSIS — R404 Transient alteration of awareness: Secondary | ICD-10-CM | POA: Diagnosis not present

## 2015-11-04 ENCOUNTER — Telehealth: Payer: Self-pay | Admitting: Neurology

## 2015-11-04 NOTE — Telephone Encounter (Signed)
Jacob [name] 05-02-2040. His wife called regarding his EEG results. Home # is [id number] and cell [phone]. Thank you

## 2015-11-04 NOTE — Telephone Encounter (Signed)
Left VM to return our call

## 2015-11-04 NOTE — Telephone Encounter (Signed)
Pls let her know the EEG did not show any seizure discharges, it was not much different from EEG done in the hospital. Thanks

## 2015-11-04 NOTE — Procedures (Signed)
ELECTROENCEPHALOGRAM REPORT  Date of Study: 10/30/2015  Patient's Name: Jacob [name] MRN: [name] Date of Birth: 07-02-1940  Referring Provider: Dr. Ellouise Newer  Clinical History: This is a 75 year old man with cognitive changes and staring spells.  Medications: Tylenol, Risaquad, Albuterol, Norvasc, Zyloprim, Symbicort, Rocaltrol, Ceftriaxone, Restasis,  ASA, Lipitor, Simbrinza, Rocaltrol, ceftriaxone, Glucerna, Victoza, Lopressor, Singulair, Deltasone, Sennokot  Technical Summary: A multichannel digital EEG recording measured by the international 10-20 system with electrodes applied with paste and impedances below 5000 ohms performed as portable with EKG monitoring in an awake and drowsy patient.  Hyperventilation was not performed. Photic stimulation was performed.  The digital EEG was referentially recorded, reformatted, and digitally filtered in a variety of bipolar and referential montages for optimal display.   Description: The patient is awake and drowsy during the recording.  During maximal wakefulness, there is a symmetric, medium voltage 8 Hz posterior dominant rhythm that attenuates with eye opening. This is admixed with a small amount of diffuse 4-6 Hz theta slowing of the waking background.  During drowsiness, there is an increase in theta slowing of the background. Deeper stages of sleep were not seen.Photic stimulation did not elicit any abnormalities.  There were no epileptiform discharges or electrographic seizures seen.    EKG lead was unremarkable.  Impression: This awake and drowsy EEG is abnormal due to mild diffuse slowing of the waking background.  Clinical Correlation of the above findings indicates mild diffuse cerebral dysfunction that is non-specific in etiology and can be seen with hypoxic/ischemic injury, toxic/metabolic encephalopathies, neurodegenerative disorders, or medication effect.  The absence of epileptiform discharges does not rule out a clinical  diagnosis of epilepsy.  Clinical correlation is advised.   Ellouise Newer, M.D.

## 2015-11-05 NOTE — Telephone Encounter (Signed)
Notified wife of results. Wife is requesting results be sent to Dr Felipa Eth at [facility].

## 2015-11-11 ENCOUNTER — Ambulatory Visit: Payer: Self-pay | Admitting: Neurology

## 2015-11-14 DIAGNOSIS — I2510 Atherosclerotic heart disease of native coronary artery without angina pectoris: Secondary | ICD-10-CM | POA: Diagnosis not present

## 2015-11-14 DIAGNOSIS — Z7901 Long term (current) use of anticoagulants: Secondary | ICD-10-CM | POA: Diagnosis not present

## 2015-11-14 DIAGNOSIS — E784 Other hyperlipidemia: Secondary | ICD-10-CM | POA: Diagnosis not present

## 2015-11-14 DIAGNOSIS — I119 Hypertensive heart disease without heart failure: Secondary | ICD-10-CM | POA: Diagnosis not present

## 2015-11-14 DIAGNOSIS — I480 Paroxysmal atrial fibrillation: Secondary | ICD-10-CM | POA: Diagnosis not present

## 2015-11-14 DIAGNOSIS — E668 Other obesity: Secondary | ICD-10-CM | POA: Diagnosis not present

## 2015-11-14 DIAGNOSIS — E1142 Type 2 diabetes mellitus with diabetic polyneuropathy: Secondary | ICD-10-CM | POA: Diagnosis not present

## 2015-11-14 DIAGNOSIS — I25118 Atherosclerotic heart disease of native coronary artery with other forms of angina pectoris: Secondary | ICD-10-CM | POA: Diagnosis not present

## 2015-11-15 DIAGNOSIS — I10 Essential (primary) hypertension: Secondary | ICD-10-CM | POA: Diagnosis not present

## 2015-11-15 DIAGNOSIS — L02212 Cutaneous abscess of back [any part, except buttock]: Secondary | ICD-10-CM | POA: Diagnosis not present

## 2015-11-15 DIAGNOSIS — R41841 Cognitive communication deficit: Secondary | ICD-10-CM | POA: Diagnosis not present

## 2015-11-15 DIAGNOSIS — K5900 Constipation, unspecified: Secondary | ICD-10-CM | POA: Diagnosis not present

## 2015-11-15 DIAGNOSIS — R1310 Dysphagia, unspecified: Secondary | ICD-10-CM | POA: Diagnosis not present

## 2015-11-15 DIAGNOSIS — E119 Type 2 diabetes mellitus without complications: Secondary | ICD-10-CM | POA: Diagnosis not present

## 2015-11-15 DIAGNOSIS — I4891 Unspecified atrial fibrillation: Secondary | ICD-10-CM | POA: Diagnosis not present

## 2015-11-15 DIAGNOSIS — E785 Hyperlipidemia, unspecified: Secondary | ICD-10-CM | POA: Diagnosis not present

## 2015-11-15 DIAGNOSIS — M4646 Discitis, unspecified, lumbar region: Secondary | ICD-10-CM | POA: Diagnosis not present

## 2015-11-15 DIAGNOSIS — Z792 Long term (current) use of antibiotics: Secondary | ICD-10-CM | POA: Diagnosis not present

## 2015-11-15 DIAGNOSIS — I509 Heart failure, unspecified: Secondary | ICD-10-CM | POA: Diagnosis not present

## 2015-11-15 DIAGNOSIS — N19 Unspecified kidney failure: Secondary | ICD-10-CM | POA: Diagnosis not present

## 2015-11-15 DIAGNOSIS — H04129 Dry eye syndrome of unspecified lacrimal gland: Secondary | ICD-10-CM | POA: Diagnosis not present

## 2015-11-15 DIAGNOSIS — G8929 Other chronic pain: Secondary | ICD-10-CM | POA: Diagnosis not present

## 2015-11-15 DIAGNOSIS — R2689 Other abnormalities of gait and mobility: Secondary | ICD-10-CM | POA: Diagnosis not present

## 2015-11-15 DIAGNOSIS — I959 Hypotension, unspecified: Secondary | ICD-10-CM | POA: Diagnosis not present

## 2015-11-15 DIAGNOSIS — R2681 Unsteadiness on feet: Secondary | ICD-10-CM | POA: Diagnosis not present

## 2015-11-15 DIAGNOSIS — G9340 Encephalopathy, unspecified: Secondary | ICD-10-CM | POA: Diagnosis not present

## 2015-11-15 DIAGNOSIS — I5030 Unspecified diastolic (congestive) heart failure: Secondary | ICD-10-CM | POA: Diagnosis not present

## 2015-11-15 DIAGNOSIS — R1314 Dysphagia, pharyngoesophageal phase: Secondary | ICD-10-CM | POA: Diagnosis not present

## 2015-11-15 DIAGNOSIS — F419 Anxiety disorder, unspecified: Secondary | ICD-10-CM | POA: Diagnosis not present

## 2015-11-15 DIAGNOSIS — N390 Urinary tract infection, site not specified: Secondary | ICD-10-CM | POA: Diagnosis not present

## 2015-11-15 DIAGNOSIS — M6281 Muscle weakness (generalized): Secondary | ICD-10-CM | POA: Diagnosis not present

## 2015-11-15 DIAGNOSIS — A419 Sepsis, unspecified organism: Secondary | ICD-10-CM | POA: Diagnosis not present

## 2015-11-17 DIAGNOSIS — J449 Chronic obstructive pulmonary disease, unspecified: Secondary | ICD-10-CM | POA: Diagnosis not present

## 2015-11-17 DIAGNOSIS — Z981 Arthrodesis status: Secondary | ICD-10-CM | POA: Diagnosis not present

## 2015-11-17 DIAGNOSIS — I959 Hypotension, unspecified: Secondary | ICD-10-CM | POA: Diagnosis not present

## 2015-11-17 DIAGNOSIS — L02212 Cutaneous abscess of back [any part, except buttock]: Secondary | ICD-10-CM | POA: Diagnosis not present

## 2015-11-17 DIAGNOSIS — E109 Type 1 diabetes mellitus without complications: Secondary | ICD-10-CM | POA: Diagnosis not present

## 2015-11-17 DIAGNOSIS — R2681 Unsteadiness on feet: Secondary | ICD-10-CM | POA: Diagnosis not present

## 2015-11-17 DIAGNOSIS — N19 Unspecified kidney failure: Secondary | ICD-10-CM | POA: Diagnosis not present

## 2015-11-17 DIAGNOSIS — R2689 Other abnormalities of gait and mobility: Secondary | ICD-10-CM | POA: Diagnosis not present

## 2015-11-17 DIAGNOSIS — N179 Acute kidney failure, unspecified: Secondary | ICD-10-CM | POA: Diagnosis not present

## 2015-11-17 DIAGNOSIS — R1310 Dysphagia, unspecified: Secondary | ICD-10-CM | POA: Diagnosis not present

## 2015-11-17 DIAGNOSIS — I4891 Unspecified atrial fibrillation: Secondary | ICD-10-CM | POA: Diagnosis not present

## 2015-11-17 DIAGNOSIS — M6281 Muscle weakness (generalized): Secondary | ICD-10-CM | POA: Diagnosis not present

## 2015-11-17 DIAGNOSIS — A419 Sepsis, unspecified organism: Secondary | ICD-10-CM | POA: Diagnosis not present

## 2015-11-17 DIAGNOSIS — M4646 Discitis, unspecified, lumbar region: Secondary | ICD-10-CM | POA: Diagnosis not present

## 2015-11-17 DIAGNOSIS — N390 Urinary tract infection, site not specified: Secondary | ICD-10-CM | POA: Diagnosis not present

## 2015-11-17 DIAGNOSIS — R1314 Dysphagia, pharyngoesophageal phase: Secondary | ICD-10-CM | POA: Diagnosis not present

## 2015-11-17 DIAGNOSIS — I5030 Unspecified diastolic (congestive) heart failure: Secondary | ICD-10-CM | POA: Diagnosis not present

## 2015-11-17 DIAGNOSIS — L0291 Cutaneous abscess, unspecified: Secondary | ICD-10-CM | POA: Diagnosis not present

## 2015-11-17 DIAGNOSIS — M4645 Discitis, unspecified, thoracolumbar region: Secondary | ICD-10-CM | POA: Diagnosis not present

## 2015-11-17 DIAGNOSIS — R41841 Cognitive communication deficit: Secondary | ICD-10-CM | POA: Diagnosis not present

## 2015-11-17 DIAGNOSIS — G9340 Encephalopathy, unspecified: Secondary | ICD-10-CM | POA: Diagnosis not present

## 2015-11-19 ENCOUNTER — Ambulatory Visit: Payer: Self-pay | Admitting: Neurology

## 2015-11-27 ENCOUNTER — Encounter (HOSPITAL_COMMUNITY): Payer: Self-pay | Admitting: Emergency Medicine

## 2015-11-27 ENCOUNTER — Inpatient Hospital Stay (HOSPITAL_COMMUNITY)
Admission: EM | Admit: 2015-11-27 | Discharge: 2015-12-03 | DRG: 291 | Disposition: A | Discharge status: Skilled Nursing Facility | Payer: PPO | Attending: Family Medicine | Admitting: Family Medicine

## 2015-11-27 ENCOUNTER — Emergency Department (HOSPITAL_COMMUNITY): Payer: PPO

## 2015-11-27 DIAGNOSIS — I509 Heart failure, unspecified: Secondary | ICD-10-CM | POA: Diagnosis not present

## 2015-11-27 DIAGNOSIS — G4733 Obstructive sleep apnea (adult) (pediatric): Secondary | ICD-10-CM | POA: Diagnosis present

## 2015-11-27 DIAGNOSIS — I2510 Atherosclerotic heart disease of native coronary artery without angina pectoris: Secondary | ICD-10-CM | POA: Diagnosis present

## 2015-11-27 DIAGNOSIS — I130 Hypertensive heart and chronic kidney disease with heart failure and stage 1 through stage 4 chronic kidney disease, or unspecified chronic kidney disease: Principal | ICD-10-CM | POA: Diagnosis present

## 2015-11-27 DIAGNOSIS — M069 Rheumatoid arthritis, unspecified: Secondary | ICD-10-CM | POA: Diagnosis present

## 2015-11-27 DIAGNOSIS — E876 Hypokalemia: Secondary | ICD-10-CM | POA: Diagnosis not present

## 2015-11-27 DIAGNOSIS — I110 Hypertensive heart disease with heart failure: Secondary | ICD-10-CM | POA: Diagnosis not present

## 2015-11-27 DIAGNOSIS — N184 Chronic kidney disease, stage 4 (severe): Secondary | ICD-10-CM | POA: Diagnosis present

## 2015-11-27 DIAGNOSIS — E119 Type 2 diabetes mellitus without complications: Secondary | ICD-10-CM

## 2015-11-27 DIAGNOSIS — Z7901 Long term (current) use of anticoagulants: Secondary | ICD-10-CM

## 2015-11-27 DIAGNOSIS — M25559 Pain in unspecified hip: Secondary | ICD-10-CM

## 2015-11-27 DIAGNOSIS — M353 Polymyalgia rheumatica: Secondary | ICD-10-CM | POA: Diagnosis present

## 2015-11-27 DIAGNOSIS — I5033 Acute on chronic diastolic (congestive) heart failure: Secondary | ICD-10-CM | POA: Diagnosis not present

## 2015-11-27 DIAGNOSIS — Z951 Presence of aortocoronary bypass graft: Secondary | ICD-10-CM | POA: Diagnosis not present

## 2015-11-27 DIAGNOSIS — Z7952 Long term (current) use of systemic steroids: Secondary | ICD-10-CM

## 2015-11-27 DIAGNOSIS — Z87891 Personal history of nicotine dependence: Secondary | ICD-10-CM | POA: Diagnosis not present

## 2015-11-27 DIAGNOSIS — Z7951 Long term (current) use of inhaled steroids: Secondary | ICD-10-CM

## 2015-11-27 DIAGNOSIS — Z888 Allergy status to other drugs, medicaments and biological substances status: Secondary | ICD-10-CM

## 2015-11-27 DIAGNOSIS — N189 Chronic kidney disease, unspecified: Secondary | ICD-10-CM | POA: Diagnosis present

## 2015-11-27 DIAGNOSIS — N179 Acute kidney failure, unspecified: Secondary | ICD-10-CM | POA: Diagnosis present

## 2015-11-27 DIAGNOSIS — Z85528 Personal history of other malignant neoplasm of kidney: Secondary | ICD-10-CM

## 2015-11-27 DIAGNOSIS — R0602 Shortness of breath: Secondary | ICD-10-CM | POA: Diagnosis not present

## 2015-11-27 DIAGNOSIS — D649 Anemia, unspecified: Secondary | ICD-10-CM | POA: Diagnosis present

## 2015-11-27 DIAGNOSIS — R410 Disorientation, unspecified: Secondary | ICD-10-CM

## 2015-11-27 DIAGNOSIS — Z794 Long term (current) use of insulin: Secondary | ICD-10-CM

## 2015-11-27 DIAGNOSIS — G92 Toxic encephalopathy: Secondary | ICD-10-CM | POA: Diagnosis present

## 2015-11-27 DIAGNOSIS — E1140 Type 2 diabetes mellitus with diabetic neuropathy, unspecified: Secondary | ICD-10-CM | POA: Diagnosis present

## 2015-11-27 DIAGNOSIS — I5043 Acute on chronic combined systolic (congestive) and diastolic (congestive) heart failure: Secondary | ICD-10-CM | POA: Diagnosis present

## 2015-11-27 DIAGNOSIS — Z09 Encounter for follow-up examination after completed treatment for conditions other than malignant neoplasm: Secondary | ICD-10-CM

## 2015-11-27 DIAGNOSIS — I5032 Chronic diastolic (congestive) heart failure: Secondary | ICD-10-CM | POA: Diagnosis present

## 2015-11-27 DIAGNOSIS — Z79899 Other long term (current) drug therapy: Secondary | ICD-10-CM | POA: Diagnosis not present

## 2015-11-27 DIAGNOSIS — E1169 Type 2 diabetes mellitus with other specified complication: Secondary | ICD-10-CM

## 2015-11-27 DIAGNOSIS — E1165 Type 2 diabetes mellitus with hyperglycemia: Secondary | ICD-10-CM | POA: Diagnosis present

## 2015-11-27 DIAGNOSIS — I248 Other forms of acute ischemic heart disease: Secondary | ICD-10-CM | POA: Diagnosis present

## 2015-11-27 DIAGNOSIS — T43215A Adverse effect of selective serotonin and norepinephrine reuptake inhibitors, initial encounter: Secondary | ICD-10-CM | POA: Diagnosis present

## 2015-11-27 DIAGNOSIS — J449 Chronic obstructive pulmonary disease, unspecified: Secondary | ICD-10-CM | POA: Diagnosis present

## 2015-11-27 DIAGNOSIS — E1142 Type 2 diabetes mellitus with diabetic polyneuropathy: Secondary | ICD-10-CM

## 2015-11-27 DIAGNOSIS — E1122 Type 2 diabetes mellitus with diabetic chronic kidney disease: Secondary | ICD-10-CM | POA: Diagnosis present

## 2015-11-27 DIAGNOSIS — J9601 Acute respiratory failure with hypoxia: Secondary | ICD-10-CM | POA: Diagnosis present

## 2015-11-27 HISTORY — DX: Heart failure, unspecified: I50.9

## 2015-11-27 HISTORY — DX: Anemia, unspecified: D64.9

## 2015-11-27 HISTORY — DX: Chronic kidney disease, unspecified: N18.9

## 2015-11-27 HISTORY — DX: Chronic diastolic (congestive) heart failure: I50.32

## 2015-11-27 LAB — CBC WITH DIFFERENTIAL/PLATELET
Basophils Absolute: 0 10*3/uL (ref 0.0–0.1)
Basophils Relative: 0 %
Eosinophils Absolute: 0 10*3/uL (ref 0.0–0.7)
Eosinophils Relative: 1 %
HCT: 29.2 % — ABNORMAL LOW (ref 39.0–52.0)
Hemoglobin: 9 g/dL — ABNORMAL LOW (ref 13.0–17.0)
Lymphocytes Relative: 13 %
Lymphs Abs: 0.8 10*3/uL (ref 0.7–4.0)
MCH: 30.4 pg (ref 26.0–34.0)
MCHC: 30.8 g/dL (ref 30.0–36.0)
MCV: 98.6 fL (ref 78.0–100.0)
Monocytes Absolute: 0.8 10*3/uL (ref 0.1–1.0)
Monocytes Relative: 12 %
Neutro Abs: 4.7 10*3/uL (ref 1.7–7.7)
Neutrophils Relative %: 74 %
Platelets: 270 10*3/uL (ref 150–400)
RBC: 2.96 MIL/uL — ABNORMAL LOW (ref 4.22–5.81)
RDW: 17.7 % — ABNORMAL HIGH (ref 11.5–15.5)
WBC: 6.4 10*3/uL (ref 4.0–10.5)

## 2015-11-27 LAB — BASIC METABOLIC PANEL
Anion gap: 8 (ref 5–15)
BUN: 22 mg/dL — ABNORMAL HIGH (ref 6–20)
CO2: 24 mmol/L (ref 22–32)
Calcium: 9.4 mg/dL (ref 8.9–10.3)
Chloride: 108 mmol/L (ref 101–111)
Creatinine, Ser: 1.85 mg/dL — ABNORMAL HIGH (ref 0.61–1.24)
GFR calc Af Amer: 39 mL/min — ABNORMAL LOW (ref >60)
GFR calc non Af Amer: 34 mL/min — ABNORMAL LOW (ref >60)
Glucose, Bld: 202 mg/dL — ABNORMAL HIGH (ref 65–99)
Potassium: 4.6 mmol/L (ref 3.5–5.1)
Sodium: 140 mmol/L (ref 135–145)

## 2015-11-27 LAB — BRAIN NATRIURETIC PEPTIDE: B Natriuretic Peptide: >4500 pg/mL — ABNORMAL HIGH (ref 0.0–100.0)

## 2015-11-27 LAB — I-STAT TROPONIN, ED: Troponin i, poc: 0.05 ng/mL (ref 0.00–0.08)

## 2015-11-27 MED ORDER — NITROGLYCERIN 2 % TD OINT
0.5000 [in_us] | TOPICAL_OINTMENT | Freq: Once | TRANSDERMAL | Status: AC
  Administered 2015-11-28: 0.5 [in_us] via TOPICAL
  Filled 2015-11-27: qty 1

## 2015-11-27 MED ORDER — FUROSEMIDE 10 MG/ML IJ SOLN
20.0000 mg | Freq: Once | INTRAMUSCULAR | Status: AC
  Administered 2015-11-28: 20 mg via INTRAVENOUS
  Filled 2015-11-27: qty 2

## 2015-11-27 NOTE — ED Provider Notes (Signed)
[facility] DEPT Provider Note   CSN: [id number] Arrival date & time: 11/27/15  2052     History   Chief Complaint Chief Complaint  Patient presents with  . Shortness of Breath  . Weakness    HPI Jacob [name] is a 75 y.o. male.  Patients with history of chronic kidney disease, grade 2 diastolic heart failure not currently on any diuretics, recently in hospital for discitis and subsequently 3 different rehabilitation facilities, CABG on Eliquis, COPD, DM. He was discharged home approximately 10 days ago. Overall he was doing better but was weak and less mobile per the daughter at bedside. Over the past 2 days or so patient has had much worsening shortness of breath, lower extremity edema, orthopnea. No chest pain or abdominal pain. No history of blood clots. Blood sugars have been running in the 250-450 range.       Past Medical History:  Diagnosis Date  . Allergy   . Anemia   . Anxiety   . Arthritis   . Asthma   . BPH (benign prostatic hyperplasia)   . Cancer of kidney ([place])   . Cataract   . Chronic kidney disease    chronic  kidney failure  kidney function at 42%  . COPD (chronic obstructive pulmonary disease) ([place])   . Coronary artery disease    CABG  7 bypasses  . Diabetes mellitus without complication ([place])   . Hepatitis    many years ago  . Hypertension   . Polymyalgia rheumatica (HCC)    maintained on Prednisone, Plaquenil. Followed by rhuematology every 4 months/[name].  . Shortness of breath dyspnea    with exertion  . Sleep apnea    CPAP   Trying to use    Patient Active Problem List   Diagnosis Date Noted  . Abscess   . AKI (acute kidney injury) ([place])   . Sepsis ([place]) 10/02/2015  . Acute kidney injury superimposed on chronic kidney disease ([place]) 10/02/2015  . Atrial fibrillation with RVR ([place]) 10/02/2015  . Hypotension 10/02/2015  . Anorexia   . Adjustment disorder with mixed emotional features 09/01/2015  . Acute encephalopathy 08/29/2015    . Elevated troponin 08/29/2015  . Renal cancer ([place]) 08/28/2015  . Spinal stenosis of lumbar region with radiculopathy 08/07/2015  . History of COPD   . OSA (obstructive sleep apnea) 07/27/2015  . HNP (herniated nucleus pulposus), lumbar 07/24/2015  . CHF with left ventricular diastolic dysfunction, NYHA class 2 ([place]) 01/28/2014  . PMR (polymyalgia rheumatica) ([place]) 09/09/2013  . Rheumatoid arthritis ([place]) 04/13/2012  . CAD (coronary artery disease) 12/19/2011  . Diabetes mellitus, type 2 ([place]) 12/19/2011  . Neuropathy ([place]) 12/19/2011    Past Surgical History:  Procedure Laterality Date  . APPENDECTOMY    . CATARACT EXTRACTION W/PHACO Right 11/28/2014   Procedure: CATARACT EXTRACTION PHACO AND INTRAOCULAR LENS PLACEMENT ([place]) RIGHT ;  Surgeon: Marylynn Pearson, MD;  Location: [facility];  Service: Ophthalmology;  Laterality: Right;  . CORONARY ARTERY BYPASS GRAFT  03/17/1995   Wynonia Lawman; followed every six months.  Marland Kitchen HERNIA REPAIR    . LUMBAR LAMINECTOMY/DECOMPRESSION MICRODISCECTOMY N/A 07/30/2015   Procedure: LUMBAR LAMINECTOMY DISCECTOMY ;  Surgeon: Ashok Pall, MD;  Location: [facility];  Service: Neurosurgery;  Laterality: N/A;  LUMBAR LAMINECTOMY DISCECTOMY   . LUMBAR LAMINECTOMY/DECOMPRESSION MICRODISCECTOMY N/A 09/06/2015   Procedure: Redo L3/4 Disectomy;  Surgeon: Ashok Pall, MD;  Location: [facility];  Service: Neurosurgery;  Laterality: N/A;  . PROSTATE SURGERY  TURP at [facility].       Home Medications    Prior to Admission medications   Medication Sig Start Date End Date Taking? Authorizing Provider  acetaminophen (TYLENOL) 325 MG tablet Take 2 tablets (650 mg total) by mouth every 6 (six) hours as needed for mild pain (or Fever >/= 101). 08/09/15   Silver Huguenin Elgergawy, MD  acidophilus (RISAQUAD) CAPS capsule Take 2 capsules by mouth daily. Patient not taking: Reported on 10/01/2015 08/09/15   Silver Huguenin Elgergawy, MD  albuterol (PROVENTIL HFA;VENTOLIN HFA) 108 (90 BASE) MCG/ACT  inhaler Inhale 2 puffs into the lungs every 6 (six) hours as needed for wheezing or shortness of breath. 07/16/13   Wardell Honour, MD  amiodarone (PACERONE) 200 MG tablet Take 1 tablet (200 mg total) by mouth daily. 10/13/15   Florencia Reasons, MD  apixaban (ELIQUIS) 5 MG TABS tablet Take 1 tablet (5 mg total) by mouth 2 (two) times daily. 10/13/15   Florencia Reasons, MD  atorvastatin (LIPITOR) 20 MG tablet Take 1 tablet (20 mg total) by mouth daily at 6 PM. 09/08/15   Orson Eva, MD  Brinzolamide-Brimonidine [facility]) 1-0.2 % SUSP Place 1 drop into both eyes 2 (two) times daily.     Historical Provider, MD  budesonide-formoterol (SYMBICORT) 160-4.5 MCG/ACT inhaler Inhale 2 puffs into the lungs 2 (two) times daily.    Historical Provider, MD  calcitRIOL (ROCALTROL) 0.25 MCG capsule Take 0.25 mcg by mouth every Monday, Wednesday, and Friday.    Historical Provider, MD  cycloSPORINE (RESTASIS) 0.05 % ophthalmic emulsion Place 1 drop into both eyes 2 (two) times daily.    Historical Provider, MD  feeding supplement, GLUCERNA SHAKE, (GLUCERNA SHAKE) LIQD Take 237 mLs by mouth 3 (three) times daily between meals. 08/09/15   Silver Huguenin Elgergawy, MD  finasteride (PROSCAR) 5 MG tablet Take 5 mg by mouth at bedtime.     Historical Provider, MD  hydrOXYzine (ATARAX/VISTARIL) 25 MG tablet Take 1 tablet (25 mg total) by mouth 3 (three) times daily. 10/13/15   Florencia Reasons, MD  insulin aspart (NOVOLOG) 100 UNIT/ML injection Before each meal 3 times a day, 140-199 - 2 units, 200-250 - 4 units, 251-299 - 6 units,  300-349 - 8 units,  350 or above 10 units. Insulin PEN if approved, provide syringes and needles if needed. 10/13/15   Florencia Reasons, MD  ipratropium-albuterol (DUONEB) 0.5-2.5 (3) MG/3ML SOLN Take 3 mLs by nebulization every 8 (eight) hours. Reported on 03/27/2015    Historical Provider, MD  magnesium oxide (MAG-OX) 400 MG tablet Take 1 tablet (400 mg total) by mouth daily. 10/13/15   Florencia Reasons, MD  metoprolol (LOPRESSOR) 100 MG tablet Take 1  tablet (100 mg total) by mouth 2 (two) times daily. 09/08/15   Orson Eva, MD  montelukast (SINGULAIR) 10 MG tablet Take 1 tablet (10 mg total) by mouth at bedtime. 07/27/13   Wardell Honour, MD  Multiple Vitamins-Minerals (PRESERVISION AREDS 2 PO) Take 1 tablet by mouth 2 (two) times daily.     Historical Provider, MD  pilocarpine (PILOCAR) 2 % ophthalmic solution Place 1 drop into both eyes 3 (three) times daily.    Historical Provider, MD  Potassium Chloride ER 20 MEQ TBCR Take 40 mEq by mouth every Monday, Wednesday, and Friday. 10/14/15   Florencia Reasons, MD  predniSONE (DELTASONE) 1 MG tablet Take 2 mg by mouth daily with breakfast. IN CONJUNCTION WITH ONE 5 MG TABLET TO EQUAL A TOTAL OF 7 MILLIGRAMS  Historical Provider, MD  predniSONE (DELTASONE) 5 MG tablet Take 5 mg by mouth daily with breakfast. IN CONJUNCTION WITH TWO 1 MG TABLETS TO EQUAL A TOTAL OF 7 MILLIGRAMS    Historical Provider, MD  sennosides-docusate sodium (SENOKOT-S) 8.6-50 MG tablet Take 1 tablet by mouth at bedtime. 10/13/15   Florencia Reasons, MD  sodium chloride 0.9 % nebulizer solution Take 3 mLs by nebulization every 6 (six) hours as needed for wheezing.    Historical Provider, MD  Tamsulosin HCl (FLOMAX) 0.4 MG CAPS Take 0.4 mg by mouth at bedtime.     Historical Provider, MD  traZODone (DESYREL) 100 MG tablet Take 1 tablet (100 mg total) by mouth at bedtime. 09/08/15   Orson Eva, MD    Family History Family History  Problem Relation Age of Onset  . Hypertension Other     Social History Social History  Substance Use Topics  . Smoking status: Former Smoker    Quit date: 03/16/1957  . Smokeless tobacco: Never Used  . Alcohol use No     Allergies   Ace inhibitors; Actonel [risedronate]; Ciprocinonide [fluocinolone]; Flunisolide; Metformin and related; Sertraline; Sulindac; and Terazosin   Review of Systems Review of Systems  Constitutional: Positive for fatigue. Negative for diaphoresis and fever.  HENT: Negative for  rhinorrhea and sore throat.   Eyes: Negative for redness.  Respiratory: Positive for cough and shortness of breath. Negative for chest tightness and wheezing.   Cardiovascular: Positive for leg swelling. Negative for chest pain and palpitations.  Gastrointestinal: Negative for abdominal pain, diarrhea, nausea and vomiting.  Genitourinary: Negative for dysuria.  Musculoskeletal: Negative for back pain, myalgias and neck pain.  Skin: Negative for rash.  Neurological: Negative for syncope, light-headedness and headaches.  Psychiatric/Behavioral: The patient is not nervous/anxious.      Physical Exam Updated Vital Signs BP (!) 165/101   Pulse 66   Temp 98.2 F (36.8 C) (Oral)   Resp 23   Ht 5\' 11"  (1.803 m)   Wt 86.2 kg   SpO2 100%   BMI 26.50 kg/m   Physical Exam  Constitutional: He appears well-developed and well-nourished.  HENT:  Head: Normocephalic and atraumatic.  Mouth/Throat: Oropharynx is clear and moist.  Eyes: Conjunctivae are normal. Right eye exhibits no discharge. Left eye exhibits no discharge.  Neck: Normal range of motion. Neck supple. JVD present.  Cardiovascular: Normal rate, regular rhythm and normal heart sounds.   Pulmonary/Chest: Effort normal and breath sounds normal.  Speaking in 2 to 3 word sentence fragments.   Abdominal: Soft. He exhibits no mass. There is no tenderness. There is no guarding.  Musculoskeletal: He exhibits edema (2+ pitting edema bilaterally to knees). He exhibits no tenderness.  Neurological: He is alert.  Skin: Skin is warm and dry.  Psychiatric: He has a normal mood and affect.  Nursing note and vitals reviewed.    ED Treatments / Results  Labs (all labs ordered are listed, but only abnormal results are displayed) Labs Reviewed  BASIC METABOLIC PANEL - Abnormal; Notable for the following:       Result Value   Glucose, Bld 202 (*)    BUN 22 (*)    Creatinine, Ser 1.85 (*)    GFR calc non Af Amer 34 (*)    GFR calc Af  Amer 39 (*)    All other components within normal limits  CBC WITH DIFFERENTIAL/PLATELET - Abnormal; Notable for the following:    RBC 2.96 (*)    Hemoglobin 9.0 (*)  HCT 29.2 (*)    RDW 17.7 (*)    All other components within normal limits  BRAIN NATRIURETIC PEPTIDE - Abnormal; Notable for the following:    B Natriuretic Peptide >4,500.0 (*)    All other components within normal limits  URINALYSIS, ROUTINE W REFLEX MICROSCOPIC (NOT AT [facility])  Randolm Idol, ED    EKG  EKG Interpretation  Date/Time:  Wednesday November 27 2015 21:14:24 EDT Ventricular Rate:  63 PR Interval:    QRS Duration: 87 QT Interval:  433 QTC Calculation: 444 R Axis:   33 Text Interpretation:  Sinus rhythm Borderline repolarization abnormality Abnormal ekg Confirmed by Christy Gentles  MD, DONALD ([id number]) on 11/27/2015 9:44:10 PM       Radiology Dg Chest Portable 1 View  Result Date: 11/27/2015 CLINICAL DATA:  Worsening shortness of breath. EXAM: PORTABLE CHEST 1 VIEW COMPARISON:  Most recent radiographs 10/04/15, CT 07/27/2015 FINDINGS: Patient is post median sternotomy. Stable cardiomegaly. Small left pleural effusion, new from prior. Interstitial edema has increased. Streaky right basilar opacity may be scarring. No confluent airspace disease. No pneumothorax. IMPRESSION: Increased pulmonary edema and development small pleural effusion, suggesting CHF. Electronically Signed   By: Jeb Levering M.D.   On: 11/27/2015 21:37    Procedures Procedures (including critical care time)  Medications Ordered in ED Medications  nitroGLYCERIN (NITROGLYN) 2 % ointment 0.5 inch (not administered)  furosemide (LASIX) injection 20 mg (not administered)     Initial Impression / Assessment and Plan / ED Course  I have reviewed the triage vital signs and the nursing notes.  Pertinent labs & imaging results that were available during my care of the patient were reviewed by me and considered in my medical decision  making (see chart for details).  Clinical Course   Patient seen and examined. Work-up reviewed. Medications ordered. Patient will need admission. Discussed with Dr. Roxanne Mins who will see. Will admit to hospitalist.   Vital signs reviewed and are as follows: BP (!) 165/101   Pulse 66   Temp 98.2 F (36.8 C) (Oral)   Resp 23   Ht 5\' 11"  (1.803 m)   Wt 86.2 kg   SpO2 100%   BMI 26.50 kg/m   11:58 PM Spoke with Dr. Myna Hidalgo who will admit.   Final Clinical Impressions(s) / ED Diagnoses   Final diagnoses:  Acute on chronic congestive heart failure, unspecified congestive heart failure type ([place])   Admit.   New Prescriptions New Prescriptions   No medications on file     Carlisle Cater, PA-C 22/97/98 [id number]    Delora Fuel, MD 94/17/40 [id number]

## 2015-11-27 NOTE — ED Triage Notes (Signed)
Patient arrives to ED via [facility]. EMS reports: Patient from home. Lives with wife. Patient c/o feeling weaker x 2 days, as well as leg/ankle pain and slight shortness of breath. Pitting edema 1-2+.  Patient nonambulatory at home. Recently, at rehab post hospitalization.  L AC - 20 gauge. BP 180/76, Pulse 66, 98% on room air. 381 CBG.

## 2015-11-28 ENCOUNTER — Encounter (HOSPITAL_COMMUNITY): Payer: Self-pay | Admitting: Family Medicine

## 2015-11-28 DIAGNOSIS — M069 Rheumatoid arthritis, unspecified: Secondary | ICD-10-CM

## 2015-11-28 DIAGNOSIS — I2510 Atherosclerotic heart disease of native coronary artery without angina pectoris: Secondary | ICD-10-CM

## 2015-11-28 DIAGNOSIS — I509 Heart failure, unspecified: Secondary | ICD-10-CM

## 2015-11-28 DIAGNOSIS — E119 Type 2 diabetes mellitus without complications: Secondary | ICD-10-CM

## 2015-11-28 DIAGNOSIS — G4733 Obstructive sleep apnea (adult) (pediatric): Secondary | ICD-10-CM

## 2015-11-28 DIAGNOSIS — N179 Acute kidney failure, unspecified: Secondary | ICD-10-CM | POA: Diagnosis not present

## 2015-11-28 DIAGNOSIS — N184 Chronic kidney disease, stage 4 (severe): Secondary | ICD-10-CM | POA: Diagnosis not present

## 2015-11-28 DIAGNOSIS — D649 Anemia, unspecified: Secondary | ICD-10-CM

## 2015-11-28 DIAGNOSIS — Z794 Long term (current) use of insulin: Secondary | ICD-10-CM

## 2015-11-28 DIAGNOSIS — I5033 Acute on chronic diastolic (congestive) heart failure: Secondary | ICD-10-CM | POA: Diagnosis not present

## 2015-11-28 LAB — GLUCOSE, CAPILLARY
Glucose-Capillary: 199 mg/dL — ABNORMAL HIGH (ref 65–99)
Glucose-Capillary: 238 mg/dL — ABNORMAL HIGH (ref 65–99)
Glucose-Capillary: 149 mg/dL — ABNORMAL HIGH (ref 65–99)
Glucose-Capillary: 182 mg/dL — ABNORMAL HIGH (ref 65–99)
Glucose-Capillary: 205 mg/dL — ABNORMAL HIGH (ref 65–99)

## 2015-11-28 LAB — BLOOD GAS, ARTERIAL
Acid-base deficit: 0.3 mmol/L (ref 0.0–2.0)
Bicarbonate: 23.5 mmol/L (ref 20.0–28.0)
Drawn by: 105521
O2 Content: 2 L/min
O2 Saturation: 96.9 %
Patient temperature: 98.6
pCO2 arterial: 36.5 mmHg (ref 32.0–48.0)
pH, Arterial: 7.426 (ref 7.350–7.450)
pO2, Arterial: 85.8 mmHg (ref 83.0–108.0)

## 2015-11-28 LAB — URINALYSIS, ROUTINE W REFLEX MICROSCOPIC
Bilirubin Urine: NEGATIVE
Glucose, UA: NEGATIVE mg/dL
Hgb urine dipstick: NEGATIVE
Ketones, ur: NEGATIVE mg/dL
Leukocytes, UA: NEGATIVE
Nitrite: NEGATIVE
Protein, ur: >300 mg/dL — AB
Specific Gravity, Urine: 1.016 (ref 1.005–1.030)
pH: 6.5 (ref 5.0–8.0)

## 2015-11-28 LAB — TROPONIN I
Troponin I: 0.04 ng/mL (ref <0.03)
Troponin I: 0.04 ng/mL (ref <0.03)
Troponin I: 0.04 ng/mL (ref <0.03)

## 2015-11-28 LAB — URINE MICROSCOPIC-ADD ON: Bacteria, UA: NONE SEEN

## 2015-11-28 LAB — MRSA PCR SCREENING: MRSA by PCR: POSITIVE — AB

## 2015-11-28 MED ORDER — TAMSULOSIN HCL 0.4 MG PO CAPS
0.4000 mg | ORAL_CAPSULE | Freq: Every day | ORAL | Status: DC
  Administered 2015-11-28 – 2015-12-02 (×5): 0.4 mg via ORAL
  Filled 2015-11-28 (×6): qty 1

## 2015-11-28 MED ORDER — PILOCARPINE HCL 2 % OP SOLN
1.0000 [drp] | Freq: Three times a day (TID) | OPHTHALMIC | Status: DC
  Administered 2015-11-29 – 2015-12-03 (×14): 1 [drp] via OPHTHALMIC
  Filled 2015-11-28 (×2): qty 15

## 2015-11-28 MED ORDER — FUROSEMIDE 10 MG/ML IJ SOLN
40.0000 mg | Freq: Two times a day (BID) | INTRAMUSCULAR | Status: DC
  Administered 2015-11-28 – 2015-12-02 (×8): 40 mg via INTRAVENOUS
  Filled 2015-11-28 (×9): qty 4

## 2015-11-28 MED ORDER — LORAZEPAM 2 MG/ML IJ SOLN
0.5000 mg | INTRAMUSCULAR | Status: DC | PRN
  Administered 2015-11-28 – 2015-12-01 (×4): 0.5 mg via INTRAVENOUS
  Filled 2015-11-28 (×5): qty 1

## 2015-11-28 MED ORDER — MOMETASONE FURO-FORMOTEROL FUM 200-5 MCG/ACT IN AERO
2.0000 | INHALATION_SPRAY | Freq: Two times a day (BID) | RESPIRATORY_TRACT | Status: DC
  Administered 2015-11-28 – 2015-12-02 (×7): 2 via RESPIRATORY_TRACT
  Filled 2015-11-28 (×2): qty 8.8

## 2015-11-28 MED ORDER — FINASTERIDE 5 MG PO TABS
5.0000 mg | ORAL_TABLET | Freq: Every day | ORAL | Status: DC
  Administered 2015-11-29 – 2015-12-02 (×4): 5 mg via ORAL
  Filled 2015-11-28 (×5): qty 1

## 2015-11-28 MED ORDER — FUROSEMIDE 10 MG/ML IJ SOLN
20.0000 mg | Freq: Two times a day (BID) | INTRAMUSCULAR | Status: DC
  Administered 2015-11-28: 20 mg via INTRAVENOUS
  Filled 2015-11-28: qty 2

## 2015-11-28 MED ORDER — INSULIN ASPART 100 UNIT/ML ~~LOC~~ SOLN: 0.0000 [IU] | Freq: Three times a day (TID) | SUBCUTANEOUS | Status: DC

## 2015-11-28 MED ORDER — TRAZODONE HCL 100 MG PO TABS: 100.0000 mg | ORAL_TABLET | Freq: Every day | ORAL | Status: DC

## 2015-11-28 MED ORDER — ATORVASTATIN CALCIUM 20 MG PO TABS
20.0000 mg | ORAL_TABLET | Freq: Every day | ORAL | Status: DC
  Administered 2015-11-28 – 2015-12-03 (×6): 20 mg via ORAL
  Filled 2015-11-28 (×6): qty 1

## 2015-11-28 MED ORDER — SODIUM CHLORIDE 0.9 % IV SOLN: 250.0000 mL | INTRAVENOUS | Status: DC | PRN

## 2015-11-28 MED ORDER — TRAZODONE HCL 100 MG PO TABS
100.0000 mg | ORAL_TABLET | Freq: Every day | ORAL | Status: DC
  Administered 2015-11-28 – 2015-11-30 (×3): 100 mg via ORAL
  Filled 2015-11-28 (×4): qty 1

## 2015-11-28 MED ORDER — GLUCERNA SHAKE PO LIQD
237.0000 mL | Freq: Three times a day (TID) | ORAL | Status: DC
  Administered 2015-11-28 – 2015-12-03 (×8): 237 mL via ORAL

## 2015-11-28 MED ORDER — IPRATROPIUM-ALBUTEROL 0.5-2.5 (3) MG/3ML IN SOLN
3.0000 mL | Freq: Three times a day (TID) | RESPIRATORY_TRACT | Status: DC
  Administered 2015-11-28 – 2015-11-29 (×3): 3 mL via RESPIRATORY_TRACT
  Filled 2015-11-28 (×5): qty 3

## 2015-11-28 MED ORDER — MONTELUKAST SODIUM 10 MG PO TABS
10.0000 mg | ORAL_TABLET | Freq: Every day | ORAL | Status: DC
  Administered 2015-11-28 – 2015-12-02 (×6): 10 mg via ORAL
  Filled 2015-11-28 (×6): qty 1

## 2015-11-28 MED ORDER — INSULIN ASPART 100 UNIT/ML ~~LOC~~ SOLN
0.0000 [IU] | Freq: Three times a day (TID) | SUBCUTANEOUS | Status: DC
  Administered 2015-11-28: 3 [IU] via SUBCUTANEOUS
  Administered 2015-11-28: 5 [IU] via SUBCUTANEOUS
  Administered 2015-11-28: 2 [IU] via SUBCUTANEOUS
  Administered 2015-11-29: 5 [IU] via SUBCUTANEOUS
  Administered 2015-11-29: 3 [IU] via SUBCUTANEOUS
  Administered 2015-11-29: 11 [IU] via SUBCUTANEOUS
  Administered 2015-11-30: 8 [IU] via SUBCUTANEOUS
  Administered 2015-11-30 (×2): 5 [IU] via SUBCUTANEOUS
  Administered 2015-12-01: 3 [IU] via SUBCUTANEOUS
  Administered 2015-12-01: 2 [IU] via SUBCUTANEOUS
  Administered 2015-12-02: 8 [IU] via SUBCUTANEOUS
  Administered 2015-12-02: 3 [IU] via SUBCUTANEOUS
  Administered 2015-12-03: 5 [IU] via SUBCUTANEOUS
  Administered 2015-12-03: 3 [IU] via SUBCUTANEOUS
  Administered 2015-12-03: 11 [IU] via SUBCUTANEOUS

## 2015-11-28 MED ORDER — CALCITRIOL 0.25 MCG PO CAPS
0.2500 ug | ORAL_CAPSULE | ORAL | Status: DC
  Administered 2015-11-29 – 2015-12-02 (×2): 0.25 ug via ORAL
  Filled 2015-11-28 (×4): qty 1

## 2015-11-28 MED ORDER — HYDRALAZINE HCL 20 MG/ML IJ SOLN: 10.0000 mg | INTRAMUSCULAR | Status: DC | PRN

## 2015-11-28 MED ORDER — APIXABAN 5 MG PO TABS
5.0000 mg | ORAL_TABLET | Freq: Two times a day (BID) | ORAL | Status: DC
  Administered 2015-11-28 – 2015-12-03 (×12): 5 mg via ORAL
  Filled 2015-11-28 (×13): qty 1

## 2015-11-28 MED ORDER — SODIUM CHLORIDE 0.9% FLUSH: 3.0000 mL | INTRAVENOUS | Status: DC | PRN

## 2015-11-28 MED ORDER — CYCLOSPORINE 0.05 % OP EMUL
1.0000 [drp] | Freq: Two times a day (BID) | OPHTHALMIC | Status: DC
  Administered 2015-11-29 – 2015-12-03 (×9): 1 [drp] via OPHTHALMIC
  Filled 2015-11-28 (×12): qty 1

## 2015-11-28 MED ORDER — ACETAMINOPHEN 325 MG PO TABS
650.0000 mg | ORAL_TABLET | Freq: Four times a day (QID) | ORAL | Status: DC | PRN
  Administered 2015-12-02: 650 mg via ORAL
  Filled 2015-11-28: qty 2

## 2015-11-28 MED ORDER — SODIUM CHLORIDE 0.9% FLUSH
3.0000 mL | Freq: Two times a day (BID) | INTRAVENOUS | Status: DC
  Administered 2015-11-28 – 2015-12-03 (×12): 3 mL via INTRAVENOUS

## 2015-11-28 MED ORDER — ONDANSETRON HCL 4 MG/2ML IJ SOLN: 4.0000 mg | Freq: Four times a day (QID) | INTRAMUSCULAR | Status: DC | PRN

## 2015-11-28 MED ORDER — FUROSEMIDE 10 MG/ML IJ SOLN
20.0000 mg | Freq: Once | INTRAMUSCULAR | Status: AC
  Administered 2015-11-28: 20 mg via INTRAVENOUS
  Filled 2015-11-28: qty 2

## 2015-11-28 MED ORDER — PREDNISONE 1 MG PO TABS
7.0000 mg | ORAL_TABLET | Freq: Every day | ORAL | Status: DC
  Administered 2015-11-28 – 2015-12-03 (×6): 7 mg via ORAL
  Filled 2015-11-28 (×9): qty 2

## 2015-11-28 MED ORDER — HYDROXYZINE HCL 25 MG PO TABS
25.0000 mg | ORAL_TABLET | Freq: Three times a day (TID) | ORAL | Status: DC
  Administered 2015-11-28 – 2015-12-03 (×17): 25 mg via ORAL
  Filled 2015-11-28 (×19): qty 1

## 2015-11-28 MED ORDER — AMIODARONE HCL 200 MG PO TABS
200.0000 mg | ORAL_TABLET | Freq: Every day | ORAL | Status: DC
  Administered 2015-11-28 – 2015-12-03 (×6): 200 mg via ORAL
  Filled 2015-11-28 (×6): qty 1

## 2015-11-28 MED ORDER — SENNOSIDES-DOCUSATE SODIUM 8.6-50 MG PO TABS
1.0000 | ORAL_TABLET | Freq: Every day | ORAL | Status: DC
  Administered 2015-11-29 – 2015-12-02 (×4): 1 via ORAL
  Filled 2015-11-28 (×5): qty 1

## 2015-11-28 MED ORDER — METOPROLOL TARTRATE 100 MG PO TABS
100.0000 mg | ORAL_TABLET | Freq: Two times a day (BID) | ORAL | Status: DC
  Administered 2015-11-28 – 2015-12-03 (×12): 100 mg via ORAL
  Filled 2015-11-28 (×12): qty 1

## 2015-11-28 NOTE — Clinical Social Work Placement (Signed)
   CLINICAL SOCIAL WORK PLACEMENT  NOTE  Date:  11/28/2015  Patient Details  Name: Jacob [name] MRN: [id number] Date of Birth: 1940/12/20  Clinical Social Work is seeking post-discharge placement for this patient at the [place] level of care (*CSW will initial, date and re-position this form in  chart as items are completed):  Yes   Patient/family provided with [place] Work Department's list of facilities offering this level of care within the geographic area requested by the patient (or if unable, by the patient's family).  Yes   Patient/family informed of their freedom to choose among providers that offer the needed level of care, that participate in Medicare, Medicaid or managed care program needed by the patient, have an available bed and are willing to accept the patient.  Yes   Patient/family informed of [place]'s ownership interest in [facility] and [facility], as well as of the fact that they are under no obligation to receive care at these facilities.  PASRR submitted to EDS on 11/28/15     PASRR number received on       Existing PASRR number confirmed on 11/28/15     FL2 transmitted to all facilities in geographic area requested by pt/family on 11/28/15     FL2 transmitted to all facilities within larger geographic area on       Patient informed that his/her managed care company has contracts with or will negotiate with certain facilities, including the following:            Patient/family informed of bed offers received.  Patient chooses bed at       Physician recommends and patient chooses bed at      Patient to be transferred to   on  .  Patient to be transferred to facility by       Patient family notified on   of transfer.  Name of family member notified:        PHYSICIAN Please sign FL2     Additional Comment:    _______________________________________________ Candie Chroman, LCSW 11/28/2015, 2:38  PM

## 2015-11-28 NOTE — ED Notes (Signed)
Attempted to call report x 1  

## 2015-11-28 NOTE — Clinical Social Work Note (Signed)
Clinical Social Work Assessment  Patient Details  Name: Jacob [name] MRN: [id number] Date of Birth: 1941/03/03  Date of referral:  11/28/15               Reason for consult:  Facility Placement, Discharge Planning                Permission sought to share information with:  Facility Sport and exercise psychologist, Family Supports Permission granted to share information::  Yes, Verbal Permission Granted  Name::     Building surveyor::  SNF's  Relationship::  Wife  Contact Information:  [phone]  Housing/Transportation Living arrangements for the past 2 months:  [place] of Information:  Medical Team, Spouse Patient Interpreter Needed:  None Criminal Activity/Legal Involvement Pertinent to Current Situation/Hospitalization:  No - Comment as needed Significant Relationships:  Spouse, Adult Children, Other Family Members Lives with:  Spouse Do you feel safe going back to the place where you live?  Yes Need for family participation in patient care:  Yes (Comment)  Care giving concerns:  PT recommending SNF once medically stable.   Social Worker assessment / plan:  CSW met with patient. Spouse and safety sitter at bedside. Patient not oriented. CSW introduced role and explained that PT recommendations would be discussed. [name] agreeable to SNF and wants him to return to [facility]. Patient has been getting HHPT/OT through [facility] and the [facility] [place] has been paying for it. [facility] social worker is Lind Guest [phone] ext [phone]). CSW called and left voicemail for her. [name] wants [facility] to be [facility] on the day patient discharges from SNF. No further concerns. CSW encouraged patient's wife to contact CSW as needed. CSW will continue to follow patient and his wife for support and facilitate discharge to SNF once medically stable.  Employment status:  Retired Forensic scientist:  Other (Comment Required) (Acupuncturist) PT Recommendations:  [place] / Referral to community resources:  [place]  Patient/Family's Response to care:  Patient not oriented. [name] agreeable to SNF placement. [name] supportive and involved in patient's care. [name] appreciates social work intervention.  Patient/Family's Understanding of and Emotional Response to Diagnosis, Current Treatment, and Prognosis:  Patient not oriented. [name] understands need for rehab prior to returning home. [name] appears happy with hospital care.  Emotional Assessment Appearance:  Appears stated age Attitude/Demeanor/Rapport:  Unable to Assess Affect (typically observed):  Unable to Assess Orientation:    Alcohol / Substance use:  Never Used Psych involvement (Current and /or in the community):  No (Comment)  Discharge Needs  Concerns to be addressed:  Care Coordination Readmission within the last 30 days:  No Current discharge risk:  Cognitively Impaired, Dependent with Mobility Barriers to Discharge:  [place], LCSW 11/28/2015, 2:34 PM

## 2015-11-28 NOTE — Progress Notes (Signed)
Patient is confused this morning thinking that he has been kidnapped and his family does not know where he is. The patient is currently at the nurse's station because he attempts to get up unassisted despite constant redirection. Mrs. Jacob [name] was called this morning to put Jacob [name] at peace. She will check on him later today.

## 2015-11-28 NOTE — Progress Notes (Signed)
Pt states that he does not use CPAP at home and doesn't wish to use tonight.  RN aware, RT to monitor and assess as needed.

## 2015-11-28 NOTE — H&P (Signed)
History and Physical    Jacob [name] OAC:[id number] DOB: 01-Oct-1940 DOA: 11/27/2015  PCP: [facility]   Patient coming from: Home   Chief Complaint: Dyspnea, b/l leg swelling  HPI: Jacob [name] is a 75 y.o. male with medical history significant for  COPD, CAD status post CABG, CKD stage IV, rheumatoid arthritis, and chronic diastolic CHF who presents to the emergency department with 2 days of generalized weakness, bilateral lower extremity edema, and dyspnea. Patient had a prolonged hospitalization in July of this year during which she was managed for discitis and discharged to a rehabilitation facility. He recently returned home to live with his wife and has been noted by family to be quite deconditioned. Over the past 2 days, he is been particularly weak and has been increasingly short of breath. This has progressively worsened over the past 2 days to the point where he is dyspneic between 2-3 words prior to his presentation. Concomitant development of bilateral lower extremity edema is also reported. Patient denies chest pain, palpitations, or cough. He denies headache, change in vision or hearing, loss of coordination, or focal numbness or weakness. There has been no recent change in diet or fluid intake reported, though the patient has recently returned home from a rehabilitation facility. He does not take a diuretic at home.  ED Course: Upon arrival to the ED, patient is found to be afebrile, saturating adequately on 2 L/m of supplemental oxygen, hypertensive at 175/105, and with vitals otherwise stable. EKG demonstrates sinus rhythm with low-voltage QRS and chest x-ray demonstrates increasing pulmonary edema and a small pleural effusion. Chemistry panel is notable for serum creatinine 1.85, up from an apparent baseline of 1.5. CBC features a stable normocytic anemia with hemoglobin of 9.0. Troponin is within the normal limits and BNP exceeds the quantifiable limit and is reported at  >4500. Patient was given a 20 mg IV push of Lasix in the emergency department and 1/2 inch nitroglycerin paste was applied. Patient has begun to diurese. He will be admitted to the telemetry unit for ongoing evaluation and management of dyspnea suspected secondary to acute on chronic diastolic CHF, complicated by acute kidney injury superimposed on CKD stage IV.  Review of Systems:  All other systems reviewed and apart from HPI, are negative.  Past Medical History:  Diagnosis Date  . Allergy   . Anemia   . Anxiety   . Arthritis   . Asthma   . BPH (benign prostatic hyperplasia)   . Cancer of kidney ([place])   . Cataract   . Chronic kidney disease    chronic  kidney failure  kidney function at 42%  . COPD (chronic obstructive pulmonary disease) ([place])   . Coronary artery disease    CABG  7 bypasses  . Diabetes mellitus without complication ([place])   . Hepatitis    many years ago  . Hypertension   . Polymyalgia rheumatica (HCC)    maintained on Prednisone, Plaquenil. Followed by rhuematology every 4 months/[name].  . Shortness of breath dyspnea    with exertion  . Sleep apnea    CPAP   Trying to use    Past Surgical History:  Procedure Laterality Date  . APPENDECTOMY    . CATARACT EXTRACTION W/PHACO Right 11/28/2014   Procedure: CATARACT EXTRACTION PHACO AND INTRAOCULAR LENS PLACEMENT ([place]) RIGHT ;  Surgeon: Marylynn Pearson, MD;  Location: [place];  Service: Ophthalmology;  Laterality: Right;  . CORONARY ARTERY BYPASS GRAFT  03/17/1995   [facility]ynonia Lawman;  followed every six months.  Marland Kitchen HERNIA REPAIR    . LUMBAR LAMINECTOMY/DECOMPRESSION MICRODISCECTOMY N/A 07/30/2015   Procedure: LUMBAR LAMINECTOMY DISCECTOMY ;  Surgeon: Ashok Pall, MD;  Location: [place] [facility];  Service: Neurosurgery;  Laterality: N/A;  LUMBAR LAMINECTOMY DISCECTOMY   . LUMBAR LAMINECTOMY/DECOMPRESSION MICRODISCECTOMY N/A 09/06/2015   Procedure: Redo L3/4 Disectomy;  Surgeon: Ashok Pall, MD;  Location: [place] [facility];  Service:  Neurosurgery;  Laterality: N/A;  . PROSTATE SURGERY     TURP at [place].     reports that he quit smoking about 58 years ago. He has never used smokeless tobacco. He reports that he does not drink alcohol or use drugs.  Allergies  Allergen Reactions  . Ace Inhibitors Other (See Comments)    Probably nausea and vomiting per patient   . Actonel [Risedronate] Nausea And Vomiting  . Ciprocinonide [Fluocinolone] Other (See Comments)    Probably nausea and vomiting per patient  . Flunisolide Other (See Comments)    Probably nausea and vomiting per patient   . Metformin And Related Other (See Comments)    Probably nausea and vomiting per patient   . Sertraline Other (See Comments)    Probably nausea and vomiting per patient   . Sulindac Other (See Comments)    Probably nausea and vomiting per patient   . Terazosin Other (See Comments)    Probably nausea and vomiting per patient     Family History  Problem Relation Age of Onset  . Hypertension Other      Prior to Admission medications   Medication Sig Start Date End Date Taking? Authorizing Provider  acetaminophen (TYLENOL) 325 MG tablet Take 2 tablets (650 mg total) by mouth every 6 (six) hours as needed for mild pain (or Fever >/= 101). 08/09/15   Silver Huguenin Elgergawy, MD  acidophilus (RISAQUAD) CAPS capsule Take 2 capsules by mouth daily. Patient not taking: Reported on 10/01/2015 08/09/15   Silver Huguenin Elgergawy, MD  albuterol (PROVENTIL HFA;VENTOLIN HFA) 108 (90 BASE) MCG/ACT inhaler Inhale 2 puffs into the lungs every 6 (six) hours as needed for wheezing or shortness of breath. 07/16/13   Wardell Honour, MD  amiodarone (PACERONE) 200 MG tablet Take 1 tablet (200 mg total) by mouth daily. 10/13/15   Florencia Reasons, MD  apixaban (ELIQUIS) 5 MG TABS tablet Take 1 tablet (5 mg total) by mouth 2 (two) times daily. 10/13/15   Florencia Reasons, MD  atorvastatin (LIPITOR) 20 MG tablet Take 1 tablet (20 mg total) by mouth daily at 6 PM. 09/08/15   Orson Eva, MD    Brinzolamide-Brimonidine [facility]) 1-0.2 % SUSP Place 1 drop into both eyes 2 (two) times daily.     Historical Provider, MD  budesonide-formoterol (SYMBICORT) 160-4.5 MCG/ACT inhaler Inhale 2 puffs into the lungs 2 (two) times daily.    Historical Provider, MD  calcitRIOL (ROCALTROL) 0.25 MCG capsule Take 0.25 mcg by mouth every Monday, Wednesday, and Friday.    Historical Provider, MD  cycloSPORINE (RESTASIS) 0.05 % ophthalmic emulsion Place 1 drop into both eyes 2 (two) times daily.    Historical Provider, MD  feeding supplement, GLUCERNA SHAKE, (GLUCERNA SHAKE) LIQD Take 237 mLs by mouth 3 (three) times daily between meals. 08/09/15   Silver Huguenin Elgergawy, MD  finasteride (PROSCAR) 5 MG tablet Take 5 mg by mouth at bedtime.     Historical Provider, MD  hydrOXYzine (ATARAX/VISTARIL) 25 MG tablet Take 1 tablet (25 mg total) by mouth 3 (three) times daily. 10/13/15  Florencia Reasons, MD  insulin aspart (NOVOLOG) 100 UNIT/ML injection Before each meal 3 times a day, 140-199 - 2 units, 200-250 - 4 units, 251-299 - 6 units,  300-349 - 8 units,  350 or above 10 units. Insulin PEN if approved, provide syringes and needles if needed. 10/13/15   Florencia Reasons, MD  ipratropium-albuterol (DUONEB) 0.5-2.5 (3) MG/3ML SOLN Take 3 mLs by nebulization every 8 (eight) hours. Reported on 03/27/2015    Historical Provider, MD  magnesium oxide (MAG-OX) 400 MG tablet Take 1 tablet (400 mg total) by mouth daily. 10/13/15   Florencia Reasons, MD  metoprolol (LOPRESSOR) 100 MG tablet Take 1 tablet (100 mg total) by mouth 2 (two) times daily. 09/08/15   Orson Eva, MD  montelukast (SINGULAIR) 10 MG tablet Take 1 tablet (10 mg total) by mouth at bedtime. 07/27/13   Wardell Honour, MD  Multiple Vitamins-Minerals (PRESERVISION AREDS 2 PO) Take 1 tablet by mouth 2 (two) times daily.     Historical Provider, MD  pilocarpine (PILOCAR) 2 % ophthalmic solution Place 1 drop into both eyes 3 (three) times daily.    Historical Provider, MD  Potassium Chloride ER  20 MEQ TBCR Take 40 mEq by mouth every Monday, Wednesday, and Friday. 10/14/15   Florencia Reasons, MD  predniSONE (DELTASONE) 1 MG tablet Take 2 mg by mouth daily with breakfast. IN CONJUNCTION WITH ONE 5 MG TABLET TO EQUAL A TOTAL OF 7 MILLIGRAMS    Historical Provider, MD  predniSONE (DELTASONE) 5 MG tablet Take 5 mg by mouth daily with breakfast. IN CONJUNCTION WITH TWO 1 MG TABLETS TO EQUAL A TOTAL OF 7 MILLIGRAMS    Historical Provider, MD  sennosides-docusate sodium (SENOKOT-S) 8.6-50 MG tablet Take 1 tablet by mouth at bedtime. 10/13/15   Florencia Reasons, MD  sodium chloride 0.9 % nebulizer solution Take 3 mLs by nebulization every 6 (six) hours as needed for wheezing.    Historical Provider, MD  Tamsulosin HCl (FLOMAX) 0.4 MG CAPS Take 0.4 mg by mouth at bedtime.     Historical Provider, MD  traZODone (DESYREL) 100 MG tablet Take 1 tablet (100 mg total) by mouth at bedtime. 09/08/15   Orson Eva, MD    Physical Exam: Vitals:   11/27/15 2215 11/27/15 2245 11/27/15 2345 11/28/15 0015  BP: (!) 165/101 178/96 (!) 183/107   Pulse: 66 63 69 72  Resp: 23 (!) 36 20 (!) 32  Temp:      TempSrc:      SpO2: 100% 97% 99% 100%  Weight:      Height:          Constitutional: In respiratory distress with tachypnea and accessory muscle use, anxious  Eyes: PERTLA, lids and conjunctivae normal ENMT: Mucous membranes are moist. Posterior pharynx clear of any exudate or lesions.   Neck: normal, supple, no masses, no thyromegaly Respiratory: Crackles at both bases and mid-lung zones. Increased WOB, no pallor or cyanosis.  Cardiovascular: S1 & S2 heard, regular rate and rhythm, S3 gallop. Bilateral LE edema to knees.   Abdomen: No distension, no tenderness, no masses palpated. Bowel sounds normal.  Musculoskeletal: no clubbing / cyanosis. No joint deformity upper and lower extremities. Normal muscle tone.  Skin: no significant rashes, lesions, ulcers. Warm, dry, well-perfused. Neurologic: CN 2-12 grossly intact.  Sensation intact, DTR normal. Strength 5/5 in all 4 limbs.  Psychiatric: Normal judgment and insight. Alert and oriented x 3. Very anxious     Labs on Admission: I have personally reviewed  following labs and imaging studies  CBC:  Recent Labs Lab 11/27/15 2137  WBC 6.4  NEUTROABS 4.7  HGB 9.0*  HCT 29.2*  MCV 98.6  PLT 818   Basic Metabolic Panel:  Recent Labs Lab 11/27/15 2137  NA 140  K 4.6  CL 108  CO2 24  GLUCOSE 202*  BUN 22*  CREATININE 1.85*  CALCIUM 9.4   GFR: Estimated Creatinine Clearance: 36.2 mL/min (by C-G formula based on SCr of 1.85 mg/dL (H)). Liver Function Tests: No results for input(s): AST, ALT, ALKPHOS, BILITOT, PROT, ALBUMIN in the last 168 hours. No results for input(s): LIPASE, AMYLASE in the last 168 hours. No results for input(s): AMMONIA in the last 168 hours. Coagulation Profile: No results for input(s): INR, PROTIME in the last 168 hours. Cardiac Enzymes: No results for input(s): CKTOTAL, CKMB, CKMBINDEX, TROPONINI in the last 168 hours. BNP (last 3 results) No results for input(s): PROBNP in the last 8760 hours. HbA1C: No results for input(s): HGBA1C in the last 72 hours. CBG: No results for input(s): GLUCAP in the last 168 hours. Lipid Profile: No results for input(s): CHOL, HDL, LDLCALC, TRIG, CHOLHDL, LDLDIRECT in the last 72 hours. Thyroid Function Tests: No results for input(s): TSH, T4TOTAL, FREET4, T3FREE, THYROIDAB in the last 72 hours. Anemia Panel: No results for input(s): VITAMINB12, FOLATE, FERRITIN, TIBC, IRON, RETICCTPCT in the last 72 hours. Urine analysis:    Component Value Date/Time   COLORURINE YELLOW 11/27/2015 2329   APPEARANCEUR CLEAR 11/27/2015 2329   LABSPEC 1.016 11/27/2015 2329   PHURINE 6.5 11/27/2015 2329   GLUCOSEU NEGATIVE 11/27/2015 2329   HGBUR NEGATIVE 11/27/2015 2329   BILIRUBINUR NEGATIVE 11/27/2015 2329   BILIRUBINUR neg 09/09/2013 1233   KETONESUR NEGATIVE 11/27/2015 2329   PROTEINUR  >300 (A) 11/27/2015 2329   UROBILINOGEN 0.2 09/09/2013 1233   NITRITE NEGATIVE 11/27/2015 2329   LEUKOCYTESUR NEGATIVE 11/27/2015 2329   Sepsis Labs: @LABRCNTIP (procalcitonin:4,lacticidven:4) )No results found for this or any previous visit (from the past 240 hour(s)).   Radiological Exams on Admission: Dg Chest Portable 1 View  Result Date: 11/27/2015 CLINICAL DATA:  Worsening shortness of breath. EXAM: PORTABLE CHEST 1 VIEW COMPARISON:  Most recent radiographs 10/04/15, CT 07/27/2015 FINDINGS: Patient is post median sternotomy. Stable cardiomegaly. Small left pleural effusion, new from prior. Interstitial edema has increased. Streaky right basilar opacity may be scarring. No confluent airspace disease. No pneumothorax. IMPRESSION: Increased pulmonary edema and development small pleural effusion, suggesting CHF. Electronically Signed   By: Jeb Levering M.D.   On: 11/27/2015 21:37    EKG: Independently reviewed. Sinus rhythm, low-voltage QRS  Assessment/Plan  1. Acute on chronic diastolic CHF  - Presents with dyspnea, peripheral edema, found to have BNP >4500, edema on CXR, S3 gallop  - TTE (08/29/15) with EF 55-60%, mildly dilated LV with concentric hypertrophy, grade 1 diastolic dysfunction  - Not using a diuretic at home  - Given 20 mg IV Lasix in ED and has begun to diureses  - Continue Lasix 20 mg IV q12h while SLIV, fluid-restricting diet, following daily wts and I/O's  - Continue beta-blocker as tolerated, no ACE/ARB d/t renal insufficiency  - Daily BMP while diuresing  - TTE ordered   2. CAD  - Hx of CABG 20 yrs ago - No anginal complaints on admission  - No acute ischemic features to EKG and troponin wnl  - Continue Lipitor, Lopressor   3. Insulin dependent DM  - A1c 6.8% in June 2017  - Managed  at home with Novolog 2-10 units TID per sliding-scale  - Check CBG with meals and qHS  - Start a low-intensity sliding-scale correctional and adjust prn   4. AKI  superimposed on CKD stage IV  - SCr 1.85 on admission, up from apparent baseline of 1.5  - Likely due to the marked volume overload and resulting drop in CO  - Will follow daily BMP during diuresis - Hopefully the diuresis will return his hemodynamics to the Frank-Starling curve and improve CO (and renal perfusion)    5. Normocytic anemia  - Hgb 9.0 on admission and stable relative to prior CBC's  - No signs of active blood-loss    6. Rheumatoid arthritis  - Stable, continue current management with prednisone 7 mg qD   7. OSA - Continue CPAP qHS     DVT prophylaxis: Eliquis  Code Status: Partial, no intubation, pressors, or defibrillation Family Communication: Discussed with patient Disposition Plan: Admit to telemetry Consults called: None Admission status: Inpatient   Vianne Bulls, MD [facility] Pager [phone]  If 7PM-7AM, please contact night-coverage [email or web address] Password [facility]  11/28/2015, 12:51 AM

## 2015-11-28 NOTE — NC FL2 (Signed)
[place] LEVEL OF CARE SCREENING TOOL     IDENTIFICATION  Patient Name: Jacob [name] Birthdate: 05-Mar-1941 Sex: male Admission Date (Current Location): 11/27/2015  [facility] and [place] Number:  Herbalist and Address:  [facility], [place] [address], [place], [place] [address]      Provider Number: [id number]  Attending Physician Name and Address:  Mariel Aloe, MD  Relative Name and Phone Number:       Current Level of Care: Hospital Recommended Level of Care: [place] Prior Approval Number:    Date Approved/Denied:   PASRR Number: [id number]  Discharge Plan: SNF    Current Diagnoses: Patient Active Problem List   Diagnosis Date Noted  . Chronic kidney disease (CKD), stage IV (severe) ([place]) 11/27/2015  . Acute on chronic diastolic CHF (congestive heart failure) ([place]) 11/27/2015  . Normocytic anemia 11/27/2015  . Abscess   . AKI (acute kidney injury) ([place])   . Sepsis ([place]) 10/02/2015  . Acute kidney injury superimposed on chronic kidney disease ([place]) 10/02/2015  . Atrial fibrillation with RVR ([place]) 10/02/2015  . Hypotension 10/02/2015  . Anorexia   . Adjustment disorder with mixed emotional features 09/01/2015  . Acute encephalopathy 08/29/2015  . Elevated troponin 08/29/2015  . Renal cancer ([place]) 08/28/2015  . Spinal stenosis of lumbar region with radiculopathy 08/07/2015  . History of COPD   . OSA (obstructive sleep apnea) 07/27/2015  . HNP (herniated nucleus pulposus), lumbar 07/24/2015  . CHF with left ventricular diastolic dysfunction, NYHA class 2 ([place]) 01/28/2014  . PMR (polymyalgia rheumatica) ([place]) 09/09/2013  . Rheumatoid arthritis ([place]) 04/13/2012  . CAD (coronary artery disease) 12/19/2011  . Insulin dependent diabetes mellitus ([place]) 12/19/2011  . Neuropathy ([place]) 12/19/2011    Orientation RESPIRATION BLADDER Height & Weight        O2 (Nasal Canula 2 L) Continent Weight: 196 lb  10.4 oz (89.2 kg) (bedscale) Height:  5\' 11"  (180.3 cm)  BEHAVIORAL SYMPTOMS/MOOD NEUROLOGICAL BOWEL NUTRITION STATUS   (Getting out of bed)  (None) Continent Diet (Heart healthy/carb-modified. Fluid restriction: 1500 mL Fluid )  AMBULATORY STATUS COMMUNICATION OF NEEDS Skin   Extensive Assist Verbally Normal                       Personal Care Assistance Level of Assistance              Functional Limitations Info  Sight, Hearing, Speech Sight Info: Adequate Hearing Info: Adequate Speech Info: Adequate    SPECIAL CARE FACTORS FREQUENCY  PT (By licensed PT), Blood pressure, Diabetic urine testing     PT Frequency: 5 x week              Contractures Contractures Info: Not present    Additional Factors Info  Code Status, Allergies Code Status Info: Partial Allergies Info: Ace Inhibitors, Acetonel (Risedronate), Ciprocinonide Fluocinolone, Flunisolide, Metformin And Related, Sertraline, Sulindac, Terazosin           Current Medications (11/28/2015):  This is the current hospital active medication list Current Facility-Administered Medications  Medication Dose Route Frequency Provider Last Rate Last Dose  . 0.9 %  sodium chloride infusion  250 mL Intravenous PRN Vianne Bulls, MD      . acetaminophen (TYLENOL) tablet 650 mg  650 mg Oral Q6H PRN Vianne Bulls, MD      . amiodarone (PACERONE) tablet 200 mg  200 mg Oral Daily Ilene Qua  Opyd, MD   200 mg at 11/28/15 1025  . apixaban (ELIQUIS) tablet 5 mg  5 mg Oral BID Vianne Bulls, MD   5 mg at 11/28/15 1025  . atorvastatin (LIPITOR) tablet 20 mg  20 mg Oral q1800 Vianne Bulls, MD      . Derrill Memo ON 11/29/2015] calcitRIOL (ROCALTROL) capsule 0.25 mcg  0.25 mcg Oral Q M,W,F Timothy S Opyd, MD      . cycloSPORINE (RESTASIS) 0.05 % ophthalmic emulsion 1 drop  1 drop Both Eyes BID Ilene Qua Opyd, MD      . feeding supplement (GLUCERNA SHAKE) (GLUCERNA SHAKE) liquid 237 mL  237 mL Oral TID BM Ilene Qua Opyd, MD      .  finasteride (PROSCAR) tablet 5 mg  5 mg Oral QHS Timothy S Opyd, MD      . furosemide (LASIX) injection 40 mg  40 mg Intravenous Q12H Mariel Aloe, MD      . hydrALAZINE (APRESOLINE) injection 10 mg  10 mg Intravenous Q4H PRN Vianne Bulls, MD      . hydrOXYzine (ATARAX/VISTARIL) tablet 25 mg  25 mg Oral TID Vianne Bulls, MD   25 mg at 11/28/15 1025  . insulin aspart (novoLOG) injection 0-15 Units  0-15 Units Subcutaneous TID WC Vianne Bulls, MD   2 Units at 11/28/15 1215  . ipratropium-albuterol (DUONEB) 0.5-2.5 (3) MG/3ML nebulizer solution 3 mL  3 mL Nebulization Q8H Timothy S Opyd, MD      . LORazepam (ATIVAN) injection 0.5 mg  0.5 mg Intravenous Q4H PRN Vianne Bulls, MD   0.5 mg at 11/28/15 0221  . metoprolol tartrate (LOPRESSOR) tablet 100 mg  100 mg Oral BID Vianne Bulls, MD   100 mg at 11/28/15 1025  . mometasone-formoterol (DULERA) 200-5 MCG/ACT inhaler 2 puff  2 puff Inhalation BID Ilene Qua Opyd, MD      . montelukast (SINGULAIR) tablet 10 mg  10 mg Oral QHS Vianne Bulls, MD   10 mg at 11/28/15 0234  . ondansetron (ZOFRAN) injection 4 mg  4 mg Intravenous Q6H PRN Ilene Qua Opyd, MD      . pilocarpine (PILOCAR) 2 % ophthalmic solution 1 drop  1 drop Both Eyes TID Ilene Qua Opyd, MD      . predniSONE (DELTASONE) tablet 7 mg  7 mg Oral Q breakfast Vianne Bulls, MD   7 mg at 11/28/15 0836  . senna-docusate (Senokot-S) tablet 1 tablet  1 tablet Oral QHS Timothy S Opyd, MD      . sodium chloride flush (NS) 0.9 % injection 3 mL  3 mL Intravenous Q12H Ilene Qua Opyd, MD   3 mL at 11/28/15 1014  . sodium chloride flush (NS) 0.9 % injection 3 mL  3 mL Intravenous PRN Ilene Qua Opyd, MD      . tamsulosin (FLOMAX) capsule 0.4 mg  0.4 mg Oral QHS Ilene Qua Opyd, MD   0.4 mg at 11/28/15 0234  . traZODone (DESYREL) tablet 100 mg  100 mg Oral QHS Vianne Bulls, MD   100 mg at 11/28/15 0174     Discharge Medications: Please see discharge summary for a list of discharge  medications.  Relevant Imaging Results:  Relevant Lab Results:   Additional Information SS#: [id number]. Currently has a [employer] cabin crew for trying to get out of bed.  Candie Chroman, LCSW

## 2015-11-28 NOTE — Evaluation (Signed)
Physical Therapy Evaluation Patient Details Name: Jacob [name] MRN: [id number] DOB: 1940/12/27 Today's Date: 11/28/2015   History of Present Illness  Jacob [name] is a 75 y.o. male with medical history significant for  COPD, CAD status post CABG, CKD stage IV, rheumatoid arthritis, and chronic diastolic CHF who presents to the emergency department with 2 days of generalized weakness, bilateral lower extremity edema, and dyspnea.   Clinical Impression  Pt admitted with above diagnosis. Pt currently with functional limitations due to the deficits listed below (see PT Problem List). Pt will benefit from skilled PT to increase their independence and safety with mobility to allow discharge to the venue listed below.  Pt requiring MAX A of 2 to stand to RW and very lethargic. HR 67 with transfer.  At this time, recommend SNF.     Follow Up Recommendations SNF;Supervision/Assistance - 24 hour    Equipment Recommendations  None recommended by PT    Recommendations for Other Services       Precautions / Restrictions Precautions Precautions: Fall Restrictions Weight Bearing Restrictions: No      Mobility  Bed Mobility               General bed mobility comments: Pt in recliner upon arrival with sitter present  Transfers Overall transfer level: Needs assistance Equipment used: Rolling walker (2 wheeled) Transfers: Sit to/from Stand Sit to Stand: Max assist;+2 physical assistance         General transfer comment: lean to the R and back in standing.  difficulty getting COG over BOS.  Ambulation/Gait             General Gait Details: unable to attempt  Stairs            Wheelchair Mobility    Modified Rankin (Stroke Patients Only)       Balance Overall balance assessment: Needs assistance         Standing balance support: Bilateral upper extremity supported Standing balance-Leahy Scale: Zero                               Pertinent  Vitals/Pain Pain Assessment: Faces Faces Pain Scale: Hurts even more Pain Location: L low back (pt pointing to it) Pain Descriptors / Indicators: Grimacing Pain Intervention(s): Limited activity within patient's tolerance;Monitored during session;Repositioned    Home Living Family/patient expects to be discharged to:: Skilled nursing facility Living Arrangements: Spouse/significant other               Additional Comments: Per chart he has been at several different rehab facilities and recently d/c home from one of them.     Prior Function Level of Independence: Needs assistance   Gait / Transfers Assistance Needed: Unsure, but based on notes, family reports deconditioned and increasing weakness over last few days.           Hand Dominance        Extremity/Trunk Assessment   Upper Extremity Assessment: Generalized weakness           Lower Extremity Assessment: Generalized weakness         Communication   Communication: Other (comment) (mumbling, but unable to make out what he is saying)  Cognition Arousal/Alertness: Lethargic   Overall Cognitive Status: No family/caregiver present to determine baseline cognitive functioning Area of Impairment: Orientation;Following commands;Safety/judgement;Awareness;Attention Orientation Level: Disoriented to;Place;Time;Situation Current Attention Level: Focused Memory: Decreased short-term memory Following Commands: Follows one step commands  inconsistently Safety/Judgement: Decreased awareness of safety;Decreased awareness of deficits Awareness: Intellectual   General Comments: Pt unable to give any information other than he lived with wife.    General Comments General comments (skin integrity, edema, etc.): Pt very lethargic.  Would wake up briefly then fall asleep.  After standing, he immediately fell back asleep.    Exercises        Assessment/Plan    PT Assessment Patient needs continued PT services  PT  Diagnosis Difficulty walking;Generalized weakness   PT Problem List Decreased strength;Decreased activity tolerance;Decreased balance;Decreased mobility;Decreased cognition;Decreased safety awareness  PT Treatment Interventions DME instruction;Gait training;Functional mobility training;Therapeutic activities;Therapeutic exercise;Patient/family education   PT Goals (Current goals can be found in the Care Plan section) Acute Rehab PT Goals PT Goal Formulation: Patient unable to participate in goal setting Time For Goal Achievement: 12/05/15    Frequency Min 2X/week   Barriers to discharge        Co-evaluation               End of Session Equipment Utilized During Treatment: Gait belt Activity Tolerance: Patient limited by lethargy Patient left: in chair;with nursing/sitter in room;with chair alarm set Nurse Communication: Mobility status         Time: 9379-0240 PT Time Calculation (min) (ACUTE ONLY): 11 min   Charges:   PT Evaluation $PT Eval Moderate Complexity: 1 Procedure     PT G Codes:        Harlo Jaso LUBECK 11/28/2015, 10:40 AM

## 2015-11-28 NOTE — Progress Notes (Addendum)
PROGRESS NOTE    Jacob [name]  FIE:[id number] DOB: 1940-12-05 DOA: 11/27/2015 PCP: [facility]   Brief Narrative:  Jacob [name] is a 75 y.o. with a history of COPD, CAD status post CABG, CAD stage IV, rheumatoid arthritis, chronic diastolic congestive heart failure that presented with dyspnea, lower extremity edema, generalized weakness and found to be in acute heart failure exacerbation.  Assessment & Plan:   Principal Problem:   Acute on chronic diastolic CHF (congestive heart failure) (HCC) Active Problems:   CAD (coronary artery disease)   Insulin dependent diabetes mellitus (HCC)   Rheumatoid arthritis (HCC)   OSA (obstructive sleep apnea)   AKI (acute kidney injury) ([place])   Chronic kidney disease (CKD), stage IV (severe) (HCC)   Normocytic anemia   Acute on chronic diastolic failure Acute hypoxic respiratory failure Patient appears altered today. He is not on the appropriate questioning. This appears to be a change in baseline. Likely secondary to hypoxia -ABG -Increased to Lasix 40 mg twice a day -Continue beta blocker -Continue to ACEi/ARB secondary to renal insufficiency -Repeat BMP in the morning  CAD Chest post CABG 20 years ago. Appears to have no chest pain. EKG without ischemia. Troponin slightly elevated and stable, likely secondary to demand ischemia in setting of acute on chronic heart failure. -Continue atorvastatin -Continue Toprol  Insulin-dependent diabetes mellitus Moderately controlled. Patient is on chronic prednisone -Sliding-scale insulin  AKI on CKD stage IV Slightly above baseline. Will likely improve with diuresis. -BMP in the morning  Normocytic anemia Hemoglobin 9 in stable. No active signs of bleeding  Rheumatoid arthritis -Continue prednisone 7 mg daily  OSA -Continue CPAP daily at bedtime  DVT prophylaxis: Eliquis  Code Status: Partial: No intubation, no pressors, no defibrillation  Family Communication: Son at  bedside  Disposition Plan: Anticipate discharge to SNF when breathing in mental status improved   Consultants:   None  Procedures:  None  Antimicrobials:  None    Subjective: Patient worked some abdominal pain. He states that he is not having trouble breathing.  Objective: Vitals:   11/28/15 0015 11/28/15 0142 11/28/15 0426 11/28/15 1206  BP:  (!) 185/80 (!) 183/89 (!) 153/89  Pulse: 72 73 69 65  Resp: (!) 32 20 (!) 24 18  Temp:  98.5 F (36.9 C) 98.3 F (36.8 C) 97.7 F (36.5 C)  TempSrc:  Oral Oral Oral  SpO2: 100% 99% 100% 100%  Weight:  89.2 kg (196 lb 10.4 oz)    Height:  5\' 11"  (1.803 m)      Intake/Output Summary (Last 24 hours) at 11/28/15 1228 Last data filed at 11/28/15 1125  Gross per 24 hour  Intake              120 ml  Output              400 ml  Net             -280 ml   Filed Weights   11/27/15 2107 11/28/15 0142  Weight: 86.2 kg (190 lb) 89.2 kg (196 lb 10.4 oz)    Examination:  General exam: Appears In slight rest her distress Respiratory system: Clear to auscultation. Respiratory effort increased with subcostal retractions. Cardiovascular system: S1 & S2 heard, RRR. No murmurs Gastrointestinal system: Abdomen is distended, soft and nontender. Normal bowel sounds heard. Central nervous system: Somnolent. Answers questions appropriately. Extremities: Bilateral lower extremity edema. No calf tenderness Skin: No cyanosis. No rashes Psychiatry: Mood & affect  flat    Data Reviewed: I have personally reviewed following labs and imaging studies  CBC:  Recent Labs Lab 11/27/15 2137  WBC 6.4  NEUTROABS 4.7  HGB 9.0*  HCT 29.2*  MCV 98.6  PLT 973   Basic Metabolic Panel:  Recent Labs Lab 11/27/15 2137  NA 140  K 4.6  CL 108  CO2 24  GLUCOSE 202*  BUN 22*  CREATININE 1.85*  CALCIUM 9.4   GFR: Estimated Creatinine Clearance: 36.2 mL/min (by C-G formula based on SCr of 1.85 mg/dL (H)). Liver Function Tests: No results for  input(s): AST, ALT, ALKPHOS, BILITOT, PROT, ALBUMIN in the last 168 hours. No results for input(s): LIPASE, AMYLASE in the last 168 hours. No results for input(s): AMMONIA in the last 168 hours. Coagulation Profile: No results for input(s): INR, PROTIME in the last 168 hours. Cardiac Enzymes:  Recent Labs Lab 11/28/15 0105 11/28/15 0616  TROPONINI 0.04* 0.04*   BNP (last 3 results) No results for input(s): PROBNP in the last 8760 hours. HbA1C: No results for input(s): HGBA1C in the last 72 hours. CBG:  Recent Labs Lab 11/28/15 0431 11/28/15 0611 11/28/15 1138  GLUCAP 199* 238* 149*   Lipid Profile: No results for input(s): CHOL, HDL, LDLCALC, TRIG, CHOLHDL, LDLDIRECT in the last 72 hours. Thyroid Function Tests: No results for input(s): TSH, T4TOTAL, FREET4, T3FREE, THYROIDAB in the last 72 hours. Anemia Panel: No results for input(s): VITAMINB12, FOLATE, FERRITIN, TIBC, IRON, RETICCTPCT in the last 72 hours. Sepsis Labs: No results for input(s): PROCALCITON, LATICACIDVEN in the last 168 hours.  No results found for this or any previous visit (from the past 240 hour(s)).       Radiology Studies: Dg Chest Portable 1 View  Result Date: 11/27/2015 CLINICAL DATA:  Worsening shortness of breath. EXAM: PORTABLE CHEST 1 VIEW COMPARISON:  Most recent radiographs 10/04/15, CT 07/27/2015 FINDINGS: Patient is post median sternotomy. Stable cardiomegaly. Small left pleural effusion, new from prior. Interstitial edema has increased. Streaky right basilar opacity may be scarring. No confluent airspace disease. No pneumothorax. IMPRESSION: Increased pulmonary edema and development small pleural effusion, suggesting CHF. Electronically Signed   By: Jeb Levering M.D.   On: 11/27/2015 21:37        Scheduled Meds: . amiodarone  200 mg Oral Daily  . apixaban  5 mg Oral BID  . atorvastatin  20 mg Oral q1800  . [START ON 11/29/2015] calcitRIOL  0.25 mcg Oral Q M,W,F  .  cycloSPORINE  1 drop Both Eyes BID  . feeding supplement (GLUCERNA SHAKE)  237 mL Oral TID BM  . finasteride  5 mg Oral QHS  . furosemide  40 mg Intravenous Q12H  . hydrOXYzine  25 mg Oral TID  . insulin aspart  0-15 Units Subcutaneous TID WC  . ipratropium-albuterol  3 mL Nebulization Q8H  . metoprolol  100 mg Oral BID  . mometasone-formoterol  2 puff Inhalation BID  . montelukast  10 mg Oral QHS  . pilocarpine  1 drop Both Eyes TID  . predniSONE  7 mg Oral Q breakfast  . senna-docusate  1 tablet Oral QHS  . sodium chloride flush  3 mL Intravenous Q12H  . tamsulosin  0.4 mg Oral QHS  . traZODone  100 mg Oral QHS   Continuous Infusions:    LOS: 1 day     Cordelia Poche [facility] 11/28/2015, 12:28 PM Pager: [phone]  If 7PM-7AM, please contact night-coverage [email or web address] Password [email or web address] 11/28/2015, 12:28 PM

## 2015-11-28 NOTE — Clinical Social Work Note (Addendum)
CSW spoke with Amy at [facility] and asked her to check on SNF/copay days. Patient has been to three SNF's since May.  Dayton Scrape, [place] [phone]  10:51 am Received call back from [employer]. Patient would be going to SNF on day 72/100. Patient has 29 SNF days left.  Dayton Scrape, [place]

## 2015-11-28 NOTE — Consult Note (Signed)
   [facility] CM Inpatient Consult   11/28/2015  Jacob [name] 05-06-1940 [id number]   Patient has been admitted to the hospital and this makes his 4th hospitalization in the past 6 months.  Patient has had admissions to a skilled nursing facility as well.  Chart review reveals the patient is a 75 y.o. male with medical history significant for  COPD, CAD status post CABG, CKD stage IV, rheumatoid arthritis, and chronic diastolic HF who presents to the emergency department with generalized weakness, bilateral lower extremity edema, and dyspnea. Patient had a prolonged hospitalization in July of this year during which he was managed for discitis and discharged to a rehabilitation facility. He recently returned home to live with his wife and has been noted by family to be quite deconditioned. Admitted with increased and worsening weakness and shortness of breath to the point where he is dyspneic between 2-3 words. Concomitant development of bilateral lower extremity edema is also reported. Patient denies chest pain, palpitations, or cough. He denies headache, change in vision or hearing, loss of coordination, or focal numbness or weakness per History and Physical. Came by to see the patient and family and patient was receiving nursing and Respiratory Therapy care.  Will follow up at a more appropriate time. Chart review reveals patient's family is inquiring about more skilled nursing facility time. Patient is eligible through his [employer] plan for community care management services when appropriate.  For questions, please contact:  Natividad Brood, RN BSN [facility] Liaison  [phone] business mobile phone Toll free office [phone]

## 2015-11-29 ENCOUNTER — Other Ambulatory Visit (HOSPITAL_COMMUNITY): Payer: Self-pay

## 2015-11-29 ENCOUNTER — Ambulatory Visit (HOSPITAL_COMMUNITY): Payer: PPO

## 2015-11-29 DIAGNOSIS — E119 Type 2 diabetes mellitus without complications: Secondary | ICD-10-CM | POA: Diagnosis not present

## 2015-11-29 DIAGNOSIS — I509 Heart failure, unspecified: Secondary | ICD-10-CM | POA: Diagnosis not present

## 2015-11-29 DIAGNOSIS — N184 Chronic kidney disease, stage 4 (severe): Secondary | ICD-10-CM | POA: Diagnosis not present

## 2015-11-29 DIAGNOSIS — I5033 Acute on chronic diastolic (congestive) heart failure: Secondary | ICD-10-CM | POA: Diagnosis not present

## 2015-11-29 DIAGNOSIS — N179 Acute kidney failure, unspecified: Secondary | ICD-10-CM | POA: Diagnosis not present

## 2015-11-29 LAB — ECHOCARDIOGRAM COMPLETE
E decel time: 345 msec
E/e' ratio: 8.54
FS: 17 % — AB (ref 28–44)
Height: 71 in
IVS/LV PW RATIO, ED: 0.81
LA ID, A-P, ES: 46 mm
LA diam end sys: 46 mm
LA diam index: 2.24 cm/m2
LA vol A4C: 91.8 ml
LA vol index: 45.5 mL/m2
LA vol: 93.3 mL
LV E/e' medial: 8.54
LV E/e'average: 8.54
LV PW d: 13.8 mm — AB (ref 0.6–1.1)
LV dias vol index: 61 mL/m2
LV dias vol: 126 mL (ref 62–150)
LV e' LATERAL: 8.81 cm/s
LV sys vol index: 38 mL/m2
LV sys vol: 78 mL — AB (ref 21–61)
LVOT SV: 142 mL
LVOT VTI: 26.7 cm
LVOT area: 5.31 cm2
LVOT diameter: 26 mm
LVOT peak vel: 97.5 cm/s
Lateral S' vel: 8.05 cm/s
MV Dec: 345
MV Peak grad: 2 mmHg
MV pk A vel: 65.5 m/s
MV pk E vel: 75.2 m/s
RV sys press: 50 mmHg
Reg peak vel: 297 cm/s
Simpson's disk: 38
Stroke v: 48 ml
TAPSE: 18.5 mm
TDI e' lateral: 8.81
TDI e' medial: 5.55
TR max vel: 297 cm/s
Weight: 2987.2 oz

## 2015-11-29 LAB — GLUCOSE, CAPILLARY
Glucose-Capillary: 168 mg/dL — ABNORMAL HIGH (ref 65–99)
Glucose-Capillary: 248 mg/dL — ABNORMAL HIGH (ref 65–99)
Glucose-Capillary: 313 mg/dL — ABNORMAL HIGH (ref 65–99)
Glucose-Capillary: 214 mg/dL — ABNORMAL HIGH (ref 65–99)

## 2015-11-29 LAB — BASIC METABOLIC PANEL
Anion gap: 11 (ref 5–15)
BUN: 22 mg/dL — ABNORMAL HIGH (ref 6–20)
CO2: 26 mmol/L (ref 22–32)
Calcium: 9.2 mg/dL (ref 8.9–10.3)
Chloride: 104 mmol/L (ref 101–111)
Creatinine, Ser: 1.84 mg/dL — ABNORMAL HIGH (ref 0.61–1.24)
GFR calc Af Amer: 39 mL/min — ABNORMAL LOW (ref >60)
GFR calc non Af Amer: 34 mL/min — ABNORMAL LOW (ref >60)
Glucose, Bld: 169 mg/dL — ABNORMAL HIGH (ref 65–99)
Potassium: 3.9 mmol/L (ref 3.5–5.1)
Sodium: 141 mmol/L (ref 135–145)

## 2015-11-29 MED ORDER — IPRATROPIUM-ALBUTEROL 0.5-2.5 (3) MG/3ML IN SOLN: 3.0000 mL | RESPIRATORY_TRACT | Status: DC | PRN

## 2015-11-29 MED ORDER — CHLORHEXIDINE GLUCONATE CLOTH 2 % EX PADS
6.0000 | MEDICATED_PAD | Freq: Every day | CUTANEOUS | Status: AC
  Administered 2015-11-29 – 2015-12-03 (×5): 6 via TOPICAL

## 2015-11-29 MED ORDER — INSULIN GLARGINE 100 UNIT/ML ~~LOC~~ SOLN
10.0000 [IU] | Freq: Every day | SUBCUTANEOUS | Status: DC
  Administered 2015-11-29 – 2015-12-02 (×4): 10 [IU] via SUBCUTANEOUS
  Filled 2015-11-29 (×8): qty 0.1

## 2015-11-29 MED ORDER — MUPIROCIN 2 % EX OINT
1.0000 "application " | TOPICAL_OINTMENT | Freq: Two times a day (BID) | CUTANEOUS | Status: AC
  Administered 2015-11-29 – 2015-12-03 (×10): 1 via NASAL
  Filled 2015-11-29 (×2): qty 22

## 2015-11-29 NOTE — Progress Notes (Signed)
PROGRESS NOTE    Jacob [name]  IPJ:[id number] DOB: 17-Jul-1940 DOA: 11/27/2015 PCP: [facility]   Brief Narrative:  Jacob [name] is a 75 y.o. with a history of COPD, CAD status post CABG, CAD stage IV, rheumatoid arthritis, chronic diastolic congestive heart failure that presented with dyspnea, lower extremity edema, generalized weakness and found to be in acute heart failure exacerbation.  Assessment & Plan:   Principal Problem:   Acute on chronic diastolic CHF (congestive heart failure) (HCC) Active Problems:   CAD (coronary artery disease)   Insulin dependent diabetes mellitus (HCC)   Rheumatoid arthritis (HCC)   OSA (obstructive sleep apnea)   AKI (acute kidney injury) ([place])   Chronic kidney disease (CKD), stage IV (severe) (HCC)   Normocytic anemia   Acute on chronic diastolic failure Acute hypoxic respiratory failure Patient's mental status transiently improved today. Upon talking with wife, appears patient is almost at baseline. Patient has lost 10 pounds and put out 2.7 L  -continue Lasix 40 mg twice a day; may switch to by mouth regimen tomorrow if physical exam improved -Continue beta blocker -Continue to hold ACEi/ARB secondary to renal insufficiency -Repeat BMP in the morning -Wean oxygen to room air -echocardiogram pending  CAD Chest post CABG 20 years ago. Appears to have no chest pain. EKG without ischemia. Troponin slightly elevated and stable, likely secondary to demand ischemia in setting of acute on chronic heart failure. -Continue atorvastatin -Continue Toprol  Insulin-dependent diabetes mellitus Poorly controlled. Patient is on chronic prednisone. Patient on 70/30 at home -Sliding-scale insulin -Start Lantus 10 units daily  AKI on CKD stage IV Slightly above baseline. Currently stable. Will likely improve with diuresis. -BMP in the morning  Normocytic anemia Hemoglobin 9 in stable. No active signs of bleeding -Repeat CBC in the  morning  Rheumatoid arthritis -Continue prednisone 7 mg daily  OSA -Continue CPAP daily at bedtime  DVT prophylaxis: Eliquis  Code Status: Partial: No intubation, no pressors, no defibrillation  Family Communication: Wife at bedside Disposition Plan: Anticipate discharge to SNF when medically stable  Consultants:   None  Procedures:  None  Antimicrobials:  None    Subjective: Patient states that he feels a lot better. He still has some dyspnea. No chest pain  Objective: Vitals:   11/28/15 2059 11/29/15 0100 11/29/15 0500 11/29/15 1143  BP: (!) 173/70 (!) 167/67 (!) 174/70 (!) 170/66  Pulse:  62 64 70  Resp:  16 16 18   Temp:  98.9 F (37.2 C) 98.4 F (36.9 C) 97.6 F (36.4 C)  TempSrc:  Axillary Oral Oral  SpO2:  99% 100% 99%  Weight:   84.7 kg (186 lb 11.2 oz)   Height:        Intake/Output Summary (Last 24 hours) at 11/29/15 1515 Last data filed at 11/29/15 1457  Gross per 24 hour  Intake              420 ml  Output             3575 ml  Net            -3155 ml   Filed Weights   11/27/15 2107 11/28/15 0142 11/29/15 0500  Weight: 86.2 kg (190 lb) 89.2 kg (196 lb 10.4 oz) 84.7 kg (186 lb 11.2 oz)    Examination:  General exam: Patient appears comfortable Respiratory system: Crackles heard at bases bilaterally. Respiratory effort increased with subcostal retractions. Cardiovascular system: S1 & S2 heard, RRR. No murmurs  Gastrointestinal system: Abdomen is distended, soft and nontender. Normal bowel sounds heard. Central nervous system: Somnolent. Answers questions appropriately. Extremities: Bilateral lower extremity edema. No calf tenderness Skin: No cyanosis. No rashes Psychiatry: Mood & affect flat    Data Reviewed: I have personally reviewed following labs and imaging studies  CBC:  Recent Labs Lab 11/27/15 2137  WBC 6.4  NEUTROABS 4.7  HGB 9.0*  HCT 29.2*  MCV 98.6  PLT 389   Basic Metabolic Panel:  Recent Labs Lab  11/27/15 2137 11/29/15 0442  NA 140 141  K 4.6 3.9  CL 108 104  CO2 24 26  GLUCOSE 202* 169*  BUN 22* 22*  CREATININE 1.85* 1.84*  CALCIUM 9.4 9.2   GFR: Estimated Creatinine Clearance: 36.4 mL/min (by C-G formula based on SCr of 1.84 mg/dL (H)). Liver Function Tests: No results for input(s): AST, ALT, ALKPHOS, BILITOT, PROT, ALBUMIN in the last 168 hours. No results for input(s): LIPASE, AMYLASE in the last 168 hours. No results for input(s): AMMONIA in the last 168 hours. Coagulation Profile: No results for input(s): INR, PROTIME in the last 168 hours. Cardiac Enzymes:  Recent Labs Lab 11/28/15 0105 11/28/15 0616 11/28/15 1242  TROPONINI 0.04* 0.04* 0.04*   BNP (last 3 results) No results for input(s): PROBNP in the last 8760 hours. HbA1C: No results for input(s): HGBA1C in the last 72 hours. CBG:  Recent Labs Lab 11/28/15 1138 11/28/15 1644 11/28/15 2102 11/29/15 0602 11/29/15 1142  GLUCAP 149* 182* 205* 168* 248*   Lipid Profile: No results for input(s): CHOL, HDL, LDLCALC, TRIG, CHOLHDL, LDLDIRECT in the last 72 hours. Thyroid Function Tests: No results for input(s): TSH, T4TOTAL, FREET4, T3FREE, THYROIDAB in the last 72 hours. Anemia Panel: No results for input(s): VITAMINB12, FOLATE, FERRITIN, TIBC, IRON, RETICCTPCT in the last 72 hours. Sepsis Labs: No results for input(s): PROCALCITON, LATICACIDVEN in the last 168 hours.  Recent Results (from the past 240 hour(s))  MRSA PCR Screening     Status: Abnormal   Collection Time: 11/28/15  8:12 PM  Result Value Ref Range Status   MRSA by PCR POSITIVE (A) NEGATIVE Final    Comment:        The GeneXpert MRSA Assay (FDA approved for NASAL specimens only), is one component of a comprehensive MRSA colonization surveillance program. It is not intended to diagnose MRSA infection nor to guide or monitor treatment for MRSA infections. RESULT CALLED TO, READ BACK BY AND VERIFIED WITH: Arville Care RN [id number]  11/28/15 A BROWNING          Radiology Studies: Dg Chest Portable 1 View  Result Date: 11/27/2015 CLINICAL DATA:  Worsening shortness of breath. EXAM: PORTABLE CHEST 1 VIEW COMPARISON:  Most recent radiographs 10/04/15, CT 07/27/2015 FINDINGS: Patient is post median sternotomy. Stable cardiomegaly. Small left pleural effusion, new from prior. Interstitial edema has increased. Streaky right basilar opacity may be scarring. No confluent airspace disease. No pneumothorax. IMPRESSION: Increased pulmonary edema and development small pleural effusion, suggesting CHF. Electronically Signed   By: Jeb Levering M.D.   On: 11/27/2015 21:37        Scheduled Meds: . amiodarone  200 mg Oral Daily  . apixaban  5 mg Oral BID  . atorvastatin  20 mg Oral q1800  . calcitRIOL  0.25 mcg Oral Q M,W,F  . Chlorhexidine Gluconate Cloth  6 each Topical Q0600  . cycloSPORINE  1 drop Both Eyes BID  . feeding supplement (GLUCERNA SHAKE)  237 mL Oral TID BM  .  finasteride  5 mg Oral QHS  . furosemide  40 mg Intravenous Q12H  . hydrOXYzine  25 mg Oral TID  . insulin aspart  0-15 Units Subcutaneous TID WC  . metoprolol  100 mg Oral BID  . mometasone-formoterol  2 puff Inhalation BID  . montelukast  10 mg Oral QHS  . mupirocin ointment  1 application Nasal BID  . pilocarpine  1 drop Both Eyes TID  . predniSONE  7 mg Oral Q breakfast  . senna-docusate  1 tablet Oral QHS  . sodium chloride flush  3 mL Intravenous Q12H  . tamsulosin  0.4 mg Oral QHS  . traZODone  100 mg Oral QHS   Continuous Infusions:    LOS: 2 days     Cordelia Poche [facility] 11/29/2015, 3:15 PM Pager: [phone]  If 7PM-7AM, please contact night-coverage [email or web address] Password [email or web address] 11/29/2015, 3:15 PM

## 2015-11-29 NOTE — Progress Notes (Signed)
Pt refusing CPAP

## 2015-11-29 NOTE — Progress Notes (Signed)
MD, pt's med list was completed last night, pt also takes at home magnesium oxide, Megace, potassium 56mEq, calcitrol, and acidophilus, again all in his med rec, Thanks, Arvella Nigh RN

## 2015-11-29 NOTE — Clinical Social Work Note (Addendum)
CSW met with patient's wife. Discussed concerns of patient's progress. She wants to speak with MD about progress and prognosis and also wants a consult put in to Dr. Wynonia Lawman, patient's cardiologist. MD paged with this information. CSW provided bed offers so far and notified patient's wife that [facility] does not have a male bed at this time. [place] is second choice. That have declined right now but it may be due to patient having a sitter. Will call [facility] once stable for discharge and if patient has been without sitter for 24 hours. Will stay in touch with [facility] as well. [name] did not like her other two options for SNF due to distance from home.  CSW will call [facility] again around lunch time if she has not yet returned yesterday's voicemail.  Dayton Scrape, CSW [phone]  12:00 pm CSW called and left another voicemail for [facility].  Dayton Scrape, [place]  3:44 pm Received call back from [place] CSW. Patient was supposed to have 32-day respite prior to admission. She has spoken with patient's wife and advised that patient go to SNF, preferably [facility] if available under Medicare days. If [facility], he will have to go to a facility in either [place] or [place]. If patient discharges over the weekend, CSW instructed to call her and leave a message with name of facility where he went. Otherwise, [facility]W will follow up with patient's wife on Monday.  Dayton Scrape, [place]

## 2015-11-30 DIAGNOSIS — I2510 Atherosclerotic heart disease of native coronary artery without angina pectoris: Secondary | ICD-10-CM | POA: Diagnosis not present

## 2015-11-30 DIAGNOSIS — I5033 Acute on chronic diastolic (congestive) heart failure: Secondary | ICD-10-CM | POA: Diagnosis not present

## 2015-11-30 DIAGNOSIS — I509 Heart failure, unspecified: Secondary | ICD-10-CM | POA: Diagnosis not present

## 2015-11-30 DIAGNOSIS — N179 Acute kidney failure, unspecified: Secondary | ICD-10-CM | POA: Diagnosis not present

## 2015-11-30 LAB — CBC
HCT: 27.1 % — ABNORMAL LOW (ref 39.0–52.0)
Hemoglobin: 8.3 g/dL — ABNORMAL LOW (ref 13.0–17.0)
MCH: 30.1 pg (ref 26.0–34.0)
MCHC: 30.6 g/dL (ref 30.0–36.0)
MCV: 98.2 fL (ref 78.0–100.0)
Platelets: 275 10*3/uL (ref 150–400)
RBC: 2.76 MIL/uL — ABNORMAL LOW (ref 4.22–5.81)
RDW: 17.2 % — ABNORMAL HIGH (ref 11.5–15.5)
WBC: 6.1 10*3/uL (ref 4.0–10.5)

## 2015-11-30 LAB — BASIC METABOLIC PANEL
Anion gap: 6 (ref 5–15)
BUN: 22 mg/dL — ABNORMAL HIGH (ref 6–20)
CO2: 30 mmol/L (ref 22–32)
Calcium: 9 mg/dL (ref 8.9–10.3)
Chloride: 103 mmol/L (ref 101–111)
Creatinine, Ser: 1.78 mg/dL — ABNORMAL HIGH (ref 0.61–1.24)
GFR calc Af Amer: 41 mL/min — ABNORMAL LOW (ref >60)
GFR calc non Af Amer: 35 mL/min — ABNORMAL LOW (ref >60)
Glucose, Bld: 241 mg/dL — ABNORMAL HIGH (ref 65–99)
Potassium: 3.6 mmol/L (ref 3.5–5.1)
Sodium: 139 mmol/L (ref 135–145)

## 2015-11-30 LAB — GLUCOSE, CAPILLARY
Glucose-Capillary: 202 mg/dL — ABNORMAL HIGH (ref 65–99)
Glucose-Capillary: 206 mg/dL — ABNORMAL HIGH (ref 65–99)
Glucose-Capillary: 277 mg/dL — ABNORMAL HIGH (ref 65–99)
Glucose-Capillary: 173 mg/dL — ABNORMAL HIGH (ref 65–99)

## 2015-11-30 NOTE — Progress Notes (Signed)
Patient is refusing the use of CPAP at this time.

## 2015-11-30 NOTE — Progress Notes (Addendum)
[facility]  PROGRESS NOTE  TRENNON TORBECK JIR:[id number] DOB: [birth year] DOA: 11/27/2015 PCP: [facility]   Brief HPI:   75 y.o. with a history of COPD, CAD status post CABG, CAD stage IV, rheumatoid arthritis, chronic diastolic congestive heart failure that presented with dyspnea, lower extremity edema, generalized weakness and found to be in acute heart failure exacerbation.    Subjective    Patient seen and examined, denies chest pain but continues to complains of shortness of breath.    Assessment/Plan:     1. Acute on chronic systolic and diastolic CHF- BNP significantly elevated greater than 45,00. Net -5.1 liters. Patient currently on Lasix 40 mg IV every 12 hours. Creatinine slowly improving 1.78 today. Reviewed echo and chest x-ray results: Echocardiogram showed EF 75-10% with diastolic dysfunction. Chest x-ray on 11/27/2015 showed worsening pulmonary edema Patient weight 179 pounds today. We'll continue with Lasix and follow BMP in a.m. 2. Coronary artery disease- stable EKG showed no ischemic changes. Troponin mild elevation 0.04. Likely from demand ischemia. Continue metoprolol, atorvastatin 3. Diabetes mellitus- blood sugar in 200s, was started on Lantus 10 units daily yesterday. Change Lantus 20 units subcutaneous daily. Continue sliding scale insulin with NovoLog 4. Acute on CKD stage IV- creatinine is slowly improving with diuresis. Today creatinine 1.78. Follow BMP in a.m. 5. Rheumatoid arthritis- stable, continue prednisone 7 mg by mouth daily 6. Obstructive sleep apnea- stable continue CPAP at bedtime 7. Chronic anemia- patient has normocytic anemia, hemoglobin 8.3 today. His baseline hemoglobin stays around 9-10. Check CBC in a.m.       DVT prophylaxis: Apixaban  Code Status: Partial code: No intubation, pressors or defibrillation  Family Communication: No family present at bedside  Disposition Plan: Skilled nursing facility when medically  stable   Consultants:  None  Procedures:  None  Antibiotics:   Anti-infectives    None       Objective   Vitals:   11/30/15 0406 11/30/15 0746 11/30/15 1125 11/30/15 1559  BP: (!) 183/79  (!) 171/68   Pulse: 60  65   Resp: 18  19   Temp: 99 F (37.2 C)  98.7 F (37.1 C)   TempSrc: Oral  Oral   SpO2: 100% 97% 99% 100%  Weight: 81.4 kg (179 lb 6.4 oz)     Height:        Intake/Output Summary (Last 24 hours) at 11/30/15 1652 Last data filed at 11/30/15 1509  Gross per 24 hour  Intake             1017 ml  Output             2300 ml  Net            -1283 ml   Filed Weights   11/28/15 0142 11/29/15 0500 11/30/15 0406  Weight: 89.2 kg (196 lb 10.4 oz) 84.7 kg (186 lb 11.2 oz) 81.4 kg (179 lb 6.4 oz)     Physical Examination:  General exam: Appears calm and comfortable. Respiratory system: Bibasilar crackles. Respiratory effort normal. Cardiovascular system:  RRR. No  murmurs, rubs, gallops. No pedal edema. GI system: Abdomen is nondistended, soft and nontender. No organomegaly.  Central nervous system. No focal neurological deficits. 5 x 5 power in all extremities. Skin: No rashes, lesions or ulcers. Psychiatry: Alert, oriented x 3.Judgement and insight appear normal. Affect normal.    Data Reviewed: I have personally reviewed following labs and imaging studies  CBG:  Recent Labs Lab 11/29/15 1712  11/29/15 2136 11/30/15 0556 11/30/15 1135 11/30/15 1635  GLUCAP 313* 214* 202* 206* 277*    CBC:  Recent Labs Lab 11/27/15 2137 11/30/15 0215  WBC 6.4 6.1  NEUTROABS 4.7  --   HGB 9.0* 8.3*  HCT 29.2* 27.1*  MCV 98.6 98.2  PLT 270 237    Basic Metabolic Panel:  Recent Labs Lab 11/27/15 2137 11/29/15 0442 11/30/15 0215  NA 140 141 139  K 4.6 3.9 3.6  CL 108 104 103  CO2 24 26 30   GLUCOSE 202* 169* 241*  BUN 22* 22* 22*  CREATININE 1.85* 1.84* 1.78*  CALCIUM 9.4 9.2 9.0    Recent Results (from the past 240 hour(s))  MRSA PCR  Screening     Status: Abnormal   Collection Time: 11/28/15  8:12 PM  Result Value Ref Range Status   MRSA by PCR POSITIVE (A) NEGATIVE Final    Comment:        The GeneXpert MRSA Assay (FDA approved for NASAL specimens only), is one component of a comprehensive MRSA colonization surveillance program. It is not intended to diagnose MRSA infection nor to guide or monitor treatment for MRSA infections. RESULT CALLED TO, READ BACK BY AND VERIFIED WITH: Arville Care RN 2217 11/28/15 A BROWNING      Liver Function Tests: No results for input(s): AST, ALT, ALKPHOS, BILITOT, PROT, ALBUMIN in the last 168 hours. No results for input(s): LIPASE, AMYLASE in the last 168 hours. No results for input(s): AMMONIA in the last 168 hours.  Cardiac Enzymes:  Recent Labs Lab 11/28/15 0105 11/28/15 0616 11/28/15 1242  TROPONINI 0.04* 0.04* 0.04*   BNP (last 3 results)  Recent Labs  08/03/15 1203 10/01/15 2147 11/27/15 2135  BNP 265.8* 1,601.7* >4,500.0*    ProBNP (last 3 results) No results for input(s): PROBNP in the last 8760 hours.    Studies: No results found.  Scheduled Meds: . amiodarone  200 mg Oral Daily  . apixaban  5 mg Oral BID  . atorvastatin  20 mg Oral q1800  . calcitRIOL  0.25 mcg Oral Q M,W,F  . Chlorhexidine Gluconate Cloth  6 each Topical Q0600  . cycloSPORINE  1 drop Both Eyes BID  . feeding supplement (GLUCERNA SHAKE)  237 mL Oral TID BM  . finasteride  5 mg Oral QHS  . furosemide  40 mg Intravenous Q12H  . hydrOXYzine  25 mg Oral TID  . insulin aspart  0-15 Units Subcutaneous TID WC  . insulin glargine  10 Units Subcutaneous QAC supper  . metoprolol  100 mg Oral BID  . mometasone-formoterol  2 puff Inhalation BID  . montelukast  10 mg Oral QHS  . mupirocin ointment  1 application Nasal BID  . pilocarpine  1 drop Both Eyes TID  . predniSONE  7 mg Oral Q breakfast  . senna-docusate  1 tablet Oral QHS  . sodium chloride flush  3 mL Intravenous Q12H  .  tamsulosin  0.4 mg Oral QHS  . traZODone  100 mg Oral QHS    Continuous Infusions:   Time spent: 25 minutes  [facility] Pager [phone]. If 7PM-7AM, please contact night-coverage at [email or web address], Office  [phone]  password [email or web address] 11/30/2015, 4:52 PM  LOS: 3 days

## 2015-12-01 ENCOUNTER — Inpatient Hospital Stay (HOSPITAL_COMMUNITY): Payer: PPO

## 2015-12-01 DIAGNOSIS — N179 Acute kidney failure, unspecified: Secondary | ICD-10-CM | POA: Diagnosis not present

## 2015-12-01 DIAGNOSIS — M25552 Pain in left hip: Secondary | ICD-10-CM | POA: Diagnosis not present

## 2015-12-01 DIAGNOSIS — I501 Left ventricular failure: Secondary | ICD-10-CM | POA: Diagnosis not present

## 2015-12-01 DIAGNOSIS — S79912A Unspecified injury of left hip, initial encounter: Secondary | ICD-10-CM | POA: Diagnosis not present

## 2015-12-01 DIAGNOSIS — R410 Disorientation, unspecified: Secondary | ICD-10-CM | POA: Diagnosis not present

## 2015-12-01 DIAGNOSIS — I5033 Acute on chronic diastolic (congestive) heart failure: Secondary | ICD-10-CM | POA: Diagnosis not present

## 2015-12-01 LAB — GLUCOSE, CAPILLARY
Glucose-Capillary: 179 mg/dL — ABNORMAL HIGH (ref 65–99)
Glucose-Capillary: 112 mg/dL — ABNORMAL HIGH (ref 65–99)
Glucose-Capillary: 185 mg/dL — ABNORMAL HIGH (ref 65–99)
Glucose-Capillary: 147 mg/dL — ABNORMAL HIGH (ref 65–99)

## 2015-12-01 LAB — CBC
HCT: 30.1 % — ABNORMAL LOW (ref 39.0–52.0)
Hemoglobin: 9.3 g/dL — ABNORMAL LOW (ref 13.0–17.0)
MCH: 30.1 pg (ref 26.0–34.0)
MCHC: 30.9 g/dL (ref 30.0–36.0)
MCV: 97.4 fL (ref 78.0–100.0)
Platelets: 278 10*3/uL (ref 150–400)
RBC: 3.09 MIL/uL — ABNORMAL LOW (ref 4.22–5.81)
RDW: 16.6 % — ABNORMAL HIGH (ref 11.5–15.5)
WBC: 7 10*3/uL (ref 4.0–10.5)

## 2015-12-01 LAB — BASIC METABOLIC PANEL
Anion gap: 9 (ref 5–15)
BUN: 19 mg/dL (ref 6–20)
CO2: 32 mmol/L (ref 22–32)
Calcium: 9.3 mg/dL (ref 8.9–10.3)
Chloride: 99 mmol/L — ABNORMAL LOW (ref 101–111)
Creatinine, Ser: 1.81 mg/dL — ABNORMAL HIGH (ref 0.61–1.24)
GFR calc Af Amer: 40 mL/min — ABNORMAL LOW (ref >60)
GFR calc non Af Amer: 35 mL/min — ABNORMAL LOW (ref >60)
Glucose, Bld: 194 mg/dL — ABNORMAL HIGH (ref 65–99)
Potassium: 3.1 mmol/L — ABNORMAL LOW (ref 3.5–5.1)
Sodium: 140 mmol/L (ref 135–145)

## 2015-12-01 MED ORDER — POTASSIUM CHLORIDE CRYS ER 20 MEQ PO TBCR
40.0000 meq | EXTENDED_RELEASE_TABLET | Freq: Two times a day (BID) | ORAL | Status: AC
  Administered 2015-12-01 (×2): 40 meq via ORAL
  Filled 2015-12-01 (×2): qty 2

## 2015-12-01 MED ORDER — MIRTAZAPINE 15 MG PO TABS
15.0000 mg | ORAL_TABLET | Freq: Every day | ORAL | Status: DC
  Administered 2015-12-01 – 2015-12-02 (×2): 15 mg via ORAL
  Filled 2015-12-01 (×2): qty 1

## 2015-12-01 MED ORDER — HALOPERIDOL LACTATE 5 MG/ML IJ SOLN
1.0000 mg | Freq: Four times a day (QID) | INTRAMUSCULAR | Status: DC | PRN
  Administered 2015-12-01: 1 mg via INTRAVENOUS
  Filled 2015-12-01: qty 1

## 2015-12-01 NOTE — Progress Notes (Signed)
[facility]  PROGRESS NOTE  JAARON OLESON FIE:[id number] DOB: Aug 01, 1940 DOA: 11/27/2015 PCP: [facility]   Brief HPI:   75 y.o. with a history of COPD, CAD status post CABG, CAD stage IV, rheumatoid arthritis, chronic diastolic congestive heart failure that presented with dyspnea, lower extremity edema, generalized weakness and found to be in acute heart failure exacerbation.  as per family today, pt is more confused than usual.     Subjective    Patient seen and examined, no new complaints.     Assessment/Plan:     Acute on chronic systolic and diastolic CHF- started on IV lasix 40 mg BID, with appropriate diuresis, with net fluid balance of - 7.3 liters. Creatinine stable around 1.8.  Reviewed echo and chest x-ray results: Echocardiogram showed EF 16-60% with diastolic dysfunction.  Would continue with lasix for 24 hours and get a repeat  CXR today.   Coronary artery disease- stable EKG showed no ischemic changes. Troponin mild elevation 0.04. Likely from demand ischemia. Continue metoprolol, atorvastatin.  Diabetes mellitus-  CBG (last 3)   Recent Labs  11/30/15 2133 12/01/15 0647 12/01/15 1152  GLUCAP 173* 179* 112*    cbgs well controlled. No change in medications.    Acute on CKD stage IV- creatinine is slowly improving with diuresis. Monitor while on lasix.   Rheumatoid arthritis- stable, continue prednisone 7 mg by mouth daily.   Obstructive sleep apnea- stable continue CPAP at bedtime  Chronic anemia- patient has normocytic anemia, hemoglobin 8.3 today. His baseline hemoglobin stays around 9-10. Check CBC in a.m.    Hypokalemia: replete as needed. Repeat in am.   Acute encephalopathy/ Confusion: afebrile, no agitation, lethargic, weak. UA from admission without any infection. CXR  From 9/13 shows pulmonary edema and small effusion, no indication of infection. Unclear what his baseline is, grand daughter over the phone  reports this is  new,that he was not confused yesterday.  He was given trazodone instead of Remeron last night for sleep. No other new medications added.   no pain medications were given.  Will get a repeat CXR, and CT head without contrast for further evaluation. Suspect there might a component of hospital induced delirium, if the CT head is negative.  Will probably need re orientation. Requested  sitter at bedside for safety.         DVT prophylaxis: Apixaban  Code Status: Partial code: No intubation, pressors or defibrillation  Family Communication: No family present at bedside  Disposition Plan: Skilled nursing facility when medically stable   Consultants:  None  Procedures:  None  Antibiotics:   Anti-infectives    None       Objective   Vitals:   11/30/15 2130 12/01/15 0335 12/01/15 0415 12/01/15 1213  BP: (!) 186/82  (!) 177/76 (!) 158/75  Pulse: 64  66 (!) 56  Resp: 16  16 18   Temp: 97.7 F (36.5 C)  98 F (36.7 C) 98 F (36.7 C)  TempSrc: Oral  Oral Oral  SpO2: 100%  95% 96%  Weight:  82.8 kg (182 lb 8.7 oz)    Height:        Intake/Output Summary (Last 24 hours) at 12/01/15 1424 Last data filed at 12/01/15 1028  Gross per 24 hour  Intake              185 ml  Output             2800 ml  Net            -  2615 ml   Filed Weights   11/29/15 0500 11/30/15 0406 12/01/15 0335  Weight: 84.7 kg (186 lb 11.2 oz) 81.4 kg (179 lb 6.4 oz) 82.8 kg (182 lb 8.7 oz)     Physical Examination:  General exam: Appears comfortable, not in any distress.  Respiratory system: Bibasilar crackles. Respiratory effort normal. Cardiovascular system:  RRR. No  murmurs, rubs, gallops. No pedal edema. GI system: Abdomen is nondistended, soft and nontender. No organomegaly.  Central nervous system. Lethargic, mumbling, able to move all extremities.  Skin: No rashes, lesions or ulcers.     Data Reviewed: I have personally reviewed following labs and imaging studies  CBG:  Recent  Labs Lab 11/30/15 1135 11/30/15 1635 11/30/15 2133 12/01/15 0647 12/01/15 1152  GLUCAP 206* 277* 173* 179* 112*    CBC:  Recent Labs Lab 11/27/15 2137 11/30/15 0215 12/01/15 0234  WBC 6.4 6.1 7.0  NEUTROABS 4.7  --   --   HGB 9.0* 8.3* 9.3*  HCT 29.2* 27.1* 30.1*  MCV 98.6 98.2 97.4  PLT 270 275 283    Basic Metabolic Panel:  Recent Labs Lab 11/27/15 2137 11/29/15 0442 11/30/15 0215 12/01/15 0234  NA 140 141 139 140  K 4.6 3.9 3.6 3.1*  CL 108 104 103 99*  CO2 24 26 30  32  GLUCOSE 202* 169* 241* 194*  BUN 22* 22* 22* 19  CREATININE 1.85* 1.84* 1.78* 1.81*  CALCIUM 9.4 9.2 9.0 9.3    Recent Results (from the past 240 hour(s))  MRSA PCR Screening     Status: Abnormal   Collection Time: 11/28/15  8:12 PM  Result Value Ref Range Status   MRSA by PCR POSITIVE (A) NEGATIVE Final    Comment:        The GeneXpert MRSA Assay (FDA approved for NASAL specimens only), is one component of a comprehensive MRSA colonization surveillance program. It is not intended to diagnose MRSA infection nor to guide or monitor treatment for MRSA infections. RESULT CALLED TO, READ BACK BY AND VERIFIED WITH: Arville Care RN 2217 11/28/15 A BROWNING      Liver Function Tests: No results for input(s): AST, ALT, ALKPHOS, BILITOT, PROT, ALBUMIN in the last 168 hours. No results for input(s): LIPASE, AMYLASE in the last 168 hours. No results for input(s): AMMONIA in the last 168 hours.  Cardiac Enzymes:  Recent Labs Lab 11/28/15 0105 11/28/15 0616 11/28/15 1242  TROPONINI 0.04* 0.04* 0.04*   BNP (last 3 results)  Recent Labs  08/03/15 1203 10/01/15 2147 11/27/15 2135  BNP 265.8* 1,601.7* >4,500.0*    ProBNP (last 3 results) No results for input(s): PROBNP in the last 8760 hours.    Studies: No results found.  Scheduled Meds: . amiodarone  200 mg Oral Daily  . apixaban  5 mg Oral BID  . atorvastatin  20 mg Oral q1800  . calcitRIOL  0.25 mcg Oral Q M,W,F  .  Chlorhexidine Gluconate Cloth  6 each Topical Q0600  . cycloSPORINE  1 drop Both Eyes BID  . feeding supplement (GLUCERNA SHAKE)  237 mL Oral TID BM  . finasteride  5 mg Oral QHS  . furosemide  40 mg Intravenous Q12H  . hydrOXYzine  25 mg Oral TID  . insulin aspart  0-15 Units Subcutaneous TID WC  . insulin glargine  10 Units Subcutaneous QAC supper  . metoprolol  100 mg Oral BID  . mometasone-formoterol  2 puff Inhalation BID  . montelukast  10 mg Oral QHS  .  mupirocin ointment  1 application Nasal BID  . pilocarpine  1 drop Both Eyes TID  . potassium chloride  40 mEq Oral BID  . predniSONE  7 mg Oral Q breakfast  . senna-docusate  1 tablet Oral QHS  . sodium chloride flush  3 mL Intravenous Q12H  . tamsulosin  0.4 mg Oral QHS  . traZODone  100 mg Oral QHS    Continuous Infusions:   Time spent: 25 minutes  [facility] Pager [phone] If 7PM-7AM, please contact night-coverage at [email or web address], Office  [phone]  password [email or web address] 12/01/2015, 2:24 PM  LOS: 4 days

## 2015-12-01 NOTE — Progress Notes (Signed)
New attending physician is now Dr. Waldron Labs.

## 2015-12-01 NOTE — Progress Notes (Signed)
Amion paged Dr. Karleen Hampshire to inform her that family is at bedside requesting to speak to her about pt's mental condition.

## 2015-12-01 NOTE — Progress Notes (Signed)
Family requesting 2nd physician opinion.  Informed Dr. Karleen Hampshire and she stated that she would take care of it.

## 2015-12-02 DIAGNOSIS — I5033 Acute on chronic diastolic (congestive) heart failure: Secondary | ICD-10-CM | POA: Diagnosis not present

## 2015-12-02 LAB — GLUCOSE, CAPILLARY
Glucose-Capillary: 118 mg/dL — ABNORMAL HIGH (ref 65–99)
Glucose-Capillary: 195 mg/dL — ABNORMAL HIGH (ref 65–99)
Glucose-Capillary: 269 mg/dL — ABNORMAL HIGH (ref 65–99)
Glucose-Capillary: 282 mg/dL — ABNORMAL HIGH (ref 65–99)

## 2015-12-02 LAB — URINALYSIS, ROUTINE W REFLEX MICROSCOPIC
Bilirubin Urine: NEGATIVE
Glucose, UA: NEGATIVE mg/dL
Hgb urine dipstick: NEGATIVE
Ketones, ur: NEGATIVE mg/dL
Leukocytes, UA: NEGATIVE
Nitrite: POSITIVE — AB
Protein, ur: 100 mg/dL — AB
Specific Gravity, Urine: 1.007 (ref 1.005–1.030)
pH: 7.5 (ref 5.0–8.0)

## 2015-12-02 LAB — URINE MICROSCOPIC-ADD ON: RBC / HPF: NONE SEEN RBC/hpf (ref 0–5)

## 2015-12-02 LAB — BASIC METABOLIC PANEL
Anion gap: 10 (ref 5–15)
BUN: 20 mg/dL (ref 6–20)
CO2: 30 mmol/L (ref 22–32)
Calcium: 9.4 mg/dL (ref 8.9–10.3)
Chloride: 102 mmol/L (ref 101–111)
Creatinine, Ser: 1.81 mg/dL — ABNORMAL HIGH (ref 0.61–1.24)
GFR calc Af Amer: 40 mL/min — ABNORMAL LOW (ref >60)
GFR calc non Af Amer: 35 mL/min — ABNORMAL LOW (ref >60)
Glucose, Bld: 121 mg/dL — ABNORMAL HIGH (ref 65–99)
Potassium: 3.3 mmol/L — ABNORMAL LOW (ref 3.5–5.1)
Sodium: 142 mmol/L (ref 135–145)

## 2015-12-02 MED ORDER — FUROSEMIDE 40 MG PO TABS
40.0000 mg | ORAL_TABLET | Freq: Every day | ORAL | Status: DC
  Administered 2015-12-03: 40 mg via ORAL
  Filled 2015-12-02: qty 1

## 2015-12-02 MED ORDER — POTASSIUM CHLORIDE CRYS ER 20 MEQ PO TBCR
40.0000 meq | EXTENDED_RELEASE_TABLET | Freq: Once | ORAL | Status: AC
  Administered 2015-12-02: 40 meq via ORAL
  Filled 2015-12-02: qty 2

## 2015-12-02 NOTE — Discharge Instructions (Signed)

## 2015-12-02 NOTE — Progress Notes (Signed)
[facility]  PROGRESS NOTE  Jacob [name] CBS:[id number] DOB: 07/05/1940 DOA: 11/27/2015 PCP: [facility]   Brief HPI:   75 y.o. with a history of COPD, CAD status post CABG, CAD stage IV, rheumatoid arthritis, chronic diastolic congestive heart failure that presented with dyspnea, lower extremity edema, generalized weakness and found to be in acute heart failure exacerbation.   Subjective   Grand daughter reported that patient was improved off trazodone. Discussed discontinuing bed side sitter which she was in agreement for given his improvement in condition.   Assessment/Plan:   Acute on chronic systolic and diastolic CHF- started on IV lasix 40 mg BID, with appropriate diuresis, with net fluid balance of - 7.3 liters. Creatinine stable around 1.8.  Reviewed echo and chest x-ray results: Echocardiogram showed EF 84-66% with diastolic dysfunction.  Would continue with lasix for 24 hours and get a repeat  CXR today.   Coronary artery disease- stable EKG showed no ischemic changes. Troponin mild elevation 0.04. Likely from demand ischemia. Continue metoprolol, atorvastatin.   Diabetes mellitus-  CBG (last 3)   Recent Labs  12/01/15 2033 12/02/15 0801 12/02/15 1245  GLUCAP 147* 118* 195*    cbgs well controlled. Continue current regimen.   Acute on CKD stage IV- creatinine is slowly improving with diuresis. Monitor while on lasix.   Rheumatoid arthritis- stable, continue prednisone 7 mg by mouth daily.   Obstructive sleep apnea- stable continue CPAP at bedtime  Chronic anemia- patient has normocytic anemia, hemoglobin 8.3 today. His baseline hemoglobin stays around 9-10. Check CBC in a.m.   Hypokalemia: Will replace orally. Repeat in am.   Acute encephalopathy/ Confusion: Most likely due to trazodone which has been discontinued. But could also be hospital induced delirium. Ct of head negative as cause Will probably need re orientation have asked grand  daughter to try and stay with patient.   DVT prophylaxis: Apixaban  Code Status: Partial code: No intubation, pressors or defibrillation  Family Communication: No family present at bedside  Disposition Plan: Skilled nursing facility when medically stable, most likely next am.   Consultants:  None  Procedures:  None  Antibiotics:   Anti-infectives    None       Objective   Vitals:   12/01/15 2056 12/02/15 0636 12/02/15 0944 12/02/15 1426  BP:    (!) 168/76  Pulse:    68  Resp:    18  Temp:    98.8 F (37.1 C)  TempSrc:    Oral  SpO2: 96%  99% 99%  Weight:  74.9 kg (165 lb 1.6 oz)    Height:        Intake/Output Summary (Last 24 hours) at 12/02/15 1557 Last data filed at 12/02/15 1427  Gross per 24 hour  Intake              240 ml  Output             2800 ml  Net            -2560 ml   Filed Weights   11/30/15 0406 12/01/15 0335 12/02/15 0636  Weight: 81.4 kg (179 lb 6.4 oz) 82.8 kg (182 lb 8.7 oz) 74.9 kg (165 lb 1.6 oz)     Physical Examination:  General exam: Appears comfortable, not in any distress.  Respiratory system: equal chest rise.  Respiratory effort normal. No wheezes Cardiovascular system:  RRR. No  murmurs, rubs, gallops. No pedal edema. GI system: Abdomen is nondistended, soft  and nontender. No organomegaly.  Central nervous system. Somnolent. able to move all extremities.  Skin: No rashes, lesions or ulcers.     Data Reviewed: I have personally reviewed following labs and imaging studies  CBG:  Recent Labs Lab 12/01/15 1152 12/01/15 1657 12/01/15 2033 12/02/15 0801 12/02/15 1245  GLUCAP 112* 185* 147* 118* 195*    CBC:  Recent Labs Lab 11/27/15 2137 11/30/15 0215 12/01/15 0234  WBC 6.4 6.1 7.0  NEUTROABS 4.7  --   --   HGB 9.0* 8.3* 9.3*  HCT 29.2* 27.1* 30.1*  MCV 98.6 98.2 97.4  PLT 270 275 283    Basic Metabolic Panel:  Recent Labs Lab 11/27/15 2137 11/29/15 0442 11/30/15 0215 12/01/15 0234  12/02/15 0238  NA 140 141 139 140 142  K 4.6 3.9 3.6 3.1* 3.3*  CL 108 104 103 99* 102  CO2 24 26 30  32 30  GLUCOSE 202* 169* 241* 194* 121*  BUN 22* 22* 22* 19 20  CREATININE 1.85* 1.84* 1.78* 1.81* 1.81*  CALCIUM 9.4 9.2 9.0 9.3 9.4    Recent Results (from the past 240 hour(s))  MRSA PCR Screening     Status: Abnormal   Collection Time: 11/28/15  8:12 PM  Result Value Ref Range Status   MRSA by PCR POSITIVE (A) NEGATIVE Final    Comment:        The GeneXpert MRSA Assay (FDA approved for NASAL specimens only), is one component of a comprehensive MRSA colonization surveillance program. It is not intended to diagnose MRSA infection nor to guide or monitor treatment for MRSA infections. RESULT CALLED TO, READ BACK BY AND VERIFIED WITH: Arville Care RN 2217 11/28/15 A BROWNING      Liver Function Tests: No results for input(s): AST, ALT, ALKPHOS, BILITOT, PROT, ALBUMIN in the last 168 hours. No results for input(s): LIPASE, AMYLASE in the last 168 hours. No results for input(s): AMMONIA in the last 168 hours.  Cardiac Enzymes:  Recent Labs Lab 11/28/15 0105 11/28/15 0616 11/28/15 1242  TROPONINI 0.04* 0.04* 0.04*   BNP (last 3 results)  Recent Labs  08/03/15 1203 10/01/15 2147 11/27/15 2135  BNP 265.8* 1,601.7* >4,500.0*    ProBNP (last 3 results) No results for input(s): PROBNP in the last 8760 hours.    Studies: Dg Chest 1 View  Result Date: 12/01/2015 CLINICAL DATA:  Pulmonary edema.  Follow-up. EXAM: CHEST 1 VIEW COMPARISON:  11/27/2015 chest radiograph. FINDINGS: Sternotomy wires appear aligned and intact. CABG clips overlie the mediastinum. Stable cardiomediastinal silhouette with mild cardiomegaly and aortic atherosclerosis. No pneumothorax. Trace bilateral pleural effusions, decreased bilaterally. Stable mild pulmonary edema. IMPRESSION: 1. Stable mild congestive heart failure . 2. Trace bilateral pleural effusions, decreased bilaterally.  Electronically Signed   By: Ilona Sorrel M.D.   On: 12/01/2015 16:57   Ct Head Wo Contrast  Result Date: 12/01/2015 CLINICAL DATA:  75 year old male with increasing altered mental status and confusion. Recent falls. EXAM: CT HEAD WITHOUT CONTRAST TECHNIQUE: Contiguous axial images were obtained from the base of the skull through the vertex without intravenous contrast. COMPARISON:  08/03/2015 head CT FINDINGS: Brain: No evidence of acute infarction, hemorrhage, hydrocephalus, extra-axial collection or mass lesion/mass effect. Atrophy, chronic small-vessel white matter ischemic changes and remote left basal ganglia infarcts again noted again identified. Vascular: Intracranial atherosclerotic calcifications noted. Skull: Normal. Negative for fracture or focal lesion. Sinuses/Orbits: No acute finding. Other: None. IMPRESSION: No evidence of acute intracranial abnormality. Atrophy, chronic small-vessel white matter ischemic changes and  remote left basal ganglia infarcts. Electronically Signed   By: Margarette Canada M.D.   On: 12/01/2015 16:10   Dg Hip Unilat With Pelvis 2-3 Views Left  Result Date: 12/01/2015 CLINICAL DATA:  Left hip pain after fall 5 days prior. EXAM: DG HIP (WITH OR WITHOUT PELVIS) 2-3V LEFT COMPARISON:  None. FINDINGS: No fracture or dislocation in the left hip. No pelvic fracture or diastasis. Degenerative changes in the visualized lower lumbar spine. Extensive vascular calcifications throughout the soft tissues. IMPRESSION: No fracture. Electronically Signed   By: Ilona Sorrel M.D.   On: 12/01/2015 16:59    Scheduled Meds: . amiodarone  200 mg Oral Daily  . apixaban  5 mg Oral BID  . atorvastatin  20 mg Oral q1800  . calcitRIOL  0.25 mcg Oral Q M,W,F  . Chlorhexidine Gluconate Cloth  6 each Topical Q0600  . cycloSPORINE  1 drop Both Eyes BID  . feeding supplement (GLUCERNA SHAKE)  237 mL Oral TID BM  . finasteride  5 mg Oral QHS  . furosemide  40 mg Intravenous Q12H  . hydrOXYzine   25 mg Oral TID  . insulin aspart  0-15 Units Subcutaneous TID WC  . insulin glargine  10 Units Subcutaneous QAC supper  . metoprolol  100 mg Oral BID  . mirtazapine  15 mg Oral QHS  . mometasone-formoterol  2 puff Inhalation BID  . montelukast  10 mg Oral QHS  . mupirocin ointment  1 application Nasal BID  . pilocarpine  1 drop Both Eyes TID  . predniSONE  7 mg Oral Q breakfast  . senna-docusate  1 tablet Oral QHS  . sodium chloride flush  3 mL Intravenous Q12H  . tamsulosin  0.4 mg Oral QHS    Continuous Infusions:   Time spent: 30 minutes  Velvet Bathe   [facility] Pager [phone] If 7PM-7AM, please contact night-coverage at [email or web address], Office  [phone]  password [email or web address] 12/02/2015, 3:57 PM  LOS: 5 days

## 2015-12-02 NOTE — Clinical Social Work Note (Signed)
CSW met with patient. Wife at bedside. Discussed that [facility] still has no male beds and no new bed offers. [name] still does not want him to go to two SNF options. Sitter dc'ed this morning. CSW messaged admissions coordinator at [facility] and she will review his information again. [name] also asked about [facility] due to proximity to home. RN present during discussion as well. Discussed patient's wife's concerns about patient discharging when he cannot walk. RN explained that SNF's usually take patients with difficulty walking.  Dayton Scrape, [place]

## 2015-12-02 NOTE — Progress Notes (Signed)
Physical Therapy Treatment Patient Details Name: Jacob [name] MRN: [id number] DOB: 1940-08-17 Today's Date: 12/02/2015    History of Present Illness Jacob [name] is a 75 y.o. male with medical history significant for  COPD, CAD status post CABG, CKD stage IV, rheumatoid arthritis, and chronic diastolic CHF who presents to the emergency department with 2 days of generalized weakness, bilateral lower extremity edema, and dyspnea.     PT Comments    Pt much more awake than last session and able to perform SPT to recliner.  Con't to recommend SNF.  Follow Up Recommendations  SNF;Supervision/Assistance - 24 hour     Equipment Recommendations  None recommended by PT    Recommendations for Other Services       Precautions / Restrictions Precautions Precautions: Fall Restrictions Weight Bearing Restrictions: No    Mobility  Bed Mobility Overal bed mobility: Needs Assistance Bed Mobility: Rolling;Sidelying to Sit Rolling: Mod assist Sidelying to sit: Mod assist       General bed mobility comments: MAX A to scoot to EOB.  Transfers Overall transfer level: Needs assistance Equipment used: Rolling walker (2 wheeled) Transfers: Sit to/from Stand Sit to Stand: Max assist Stand pivot transfers: Mod assist       General transfer comment: Heavy lean to the R and needed A to anterior weight shift to get COG over BOS. Pt able to use RW and perform SPT with MOD A.  Ambulation/Gait             General Gait Details: SPT only   Stairs            Wheelchair Mobility    Modified Rankin (Stroke Patients Only)       Balance Overall balance assessment: Needs assistance;History of Falls Sitting-balance support: Feet supported Sitting balance-Leahy Scale: Fair     Standing balance support: Bilateral upper extremity supported Standing balance-Leahy Scale: Poor                      Cognition Arousal/Alertness: Awake/alert Behavior During Therapy: Flat  affect Overall Cognitive Status: Within Functional Limits for tasks assessed         Following Commands: Follows one step commands consistently;Follows one step commands with increased time Safety/Judgement: Decreased awareness of safety;Decreased awareness of deficits Awareness: Emergent        Exercises      General Comments        Pertinent Vitals/Pain Pain Assessment: No/denies pain    Home Living                      Prior Function            PT Goals (current goals can now be found in the care plan section) Acute Rehab PT Goals PT Goal Formulation: With family Time For Goal Achievement: 12/05/15 Progress towards PT goals: Progressing toward goals    Frequency    Min 3X/week      PT Plan Frequency needs to be updated    Co-evaluation             End of Session Equipment Utilized During Treatment: Gait belt Activity Tolerance: Patient tolerated treatment well Patient left: in chair;with call bell/phone within reach;with chair alarm set;with family/visitor present     Time: 1207-1225 PT Time Calculation (min) (ACUTE ONLY): 18 min  Charges:  $Therapeutic Activity: 8-22 mins  G Codes:      Camdin Hegner LUBECK 12/02/2015, 1:09 PM

## 2015-12-02 NOTE — Progress Notes (Signed)
Pharmacist Heart Failure Core Measure Documentation  Assessment: Jacob [name] has an EF documented as 40-50%  on 11/29/15 by echo.  Rationale: Heart failure patients with left ventricular systolic dysfunction (LVSD) and an EF < 40% should be prescribed an angiotensin converting enzyme inhibitor (ACEI) or angiotensin receptor blocker (ARB) at discharge unless a contraindication is documented in the medical record.  This patient is not currently on an ACEI or ARB for HF.  This note is being placed in the record in order to provide documentation that a contraindication to the use of these agents is present for this encounter.  ACE Inhibitor or Angiotensin Receptor Blocker is contraindicated (specify all that apply)  []   ACEI allergy AND ARB allergy []   Angioedema []   Moderate or severe aortic stenosis []   Hyperkalemia []   Hypotension []   Renal artery stenosis [x]   Worsening renal function, preexisting renal disease or dysfunction   Leodis Sias T 12/02/2015 9:49 AM

## 2015-12-02 NOTE — Progress Notes (Signed)
Patient refusing CPAP for tonight, RT made patient aware that if he changed his mind to call.

## 2015-12-03 DIAGNOSIS — M069 Rheumatoid arthritis, unspecified: Secondary | ICD-10-CM | POA: Diagnosis not present

## 2015-12-03 DIAGNOSIS — I5033 Acute on chronic diastolic (congestive) heart failure: Secondary | ICD-10-CM | POA: Diagnosis not present

## 2015-12-03 DIAGNOSIS — R1312 Dysphagia, oropharyngeal phase: Secondary | ICD-10-CM | POA: Diagnosis not present

## 2015-12-03 DIAGNOSIS — I509 Heart failure, unspecified: Secondary | ICD-10-CM | POA: Diagnosis not present

## 2015-12-03 DIAGNOSIS — I5032 Chronic diastolic (congestive) heart failure: Secondary | ICD-10-CM | POA: Diagnosis not present

## 2015-12-03 DIAGNOSIS — J80 Acute respiratory distress syndrome: Secondary | ICD-10-CM | POA: Diagnosis not present

## 2015-12-03 DIAGNOSIS — R531 Weakness: Secondary | ICD-10-CM | POA: Diagnosis not present

## 2015-12-03 DIAGNOSIS — M6281 Muscle weakness (generalized): Secondary | ICD-10-CM | POA: Diagnosis not present

## 2015-12-03 DIAGNOSIS — Z5189 Encounter for other specified aftercare: Secondary | ICD-10-CM | POA: Diagnosis not present

## 2015-12-03 DIAGNOSIS — R41841 Cognitive communication deficit: Secondary | ICD-10-CM | POA: Diagnosis not present

## 2015-12-03 LAB — BASIC METABOLIC PANEL
Anion gap: 11 (ref 5–15)
BUN: 27 mg/dL — ABNORMAL HIGH (ref 6–20)
CO2: 31 mmol/L (ref 22–32)
Calcium: 9.4 mg/dL (ref 8.9–10.3)
Chloride: 99 mmol/L — ABNORMAL LOW (ref 101–111)
Creatinine, Ser: 2.1 mg/dL — ABNORMAL HIGH (ref 0.61–1.24)
GFR calc Af Amer: 34 mL/min — ABNORMAL LOW (ref >60)
GFR calc non Af Amer: 29 mL/min — ABNORMAL LOW (ref >60)
Glucose, Bld: 252 mg/dL — ABNORMAL HIGH (ref 65–99)
Potassium: 4.9 mmol/L (ref 3.5–5.1)
Sodium: 141 mmol/L (ref 135–145)

## 2015-12-03 LAB — GLUCOSE, CAPILLARY
Glucose-Capillary: 183 mg/dL — ABNORMAL HIGH (ref 65–99)
Glucose-Capillary: 247 mg/dL — ABNORMAL HIGH (ref 65–99)
Glucose-Capillary: 320 mg/dL — ABNORMAL HIGH (ref 65–99)

## 2015-12-03 LAB — RPR: RPR Ser Ql: NONREACTIVE

## 2015-12-03 LAB — URINE CULTURE

## 2015-12-03 LAB — HIV ANTIBODY (ROUTINE TESTING W REFLEX): HIV Screen 4th Generation wRfx: NONREACTIVE

## 2015-12-03 MED ORDER — FUROSEMIDE 20 MG PO TABS: 20.0000 mg | ORAL_TABLET | Freq: Every day | ORAL | 0 refills | Status: DC

## 2015-12-03 MED ORDER — METOPROLOL TARTRATE 50 MG PO TABS: 50.0000 mg | ORAL_TABLET | Freq: Two times a day (BID) | ORAL | Status: DC

## 2015-12-03 MED ORDER — IPRATROPIUM-ALBUTEROL 0.5-2.5 (3) MG/3ML IN SOLN: 3.0000 mL | Freq: Four times a day (QID) | RESPIRATORY_TRACT | 0 refills | Status: DC | PRN

## 2015-12-03 NOTE — Clinical Social Work Note (Addendum)
CSW facilitated patient discharge including contacting patient family and facility to confirm patient discharge plans. Clinical information faxed to facility and family agreeable with plan. CSW will arrange ambulance transport via [facility] to [facility] once insurance authorization obtained. RN to call report prior to discharge [phone]).  CSW will sign off for now as social work intervention is no longer needed. Please consult [name] again if new needs arise.  Dayton Scrape, CSW [phone]  4:36 pm Authorization obtained: [id number]. [facility]AR arranged.  Dayton Scrape, [place]

## 2015-12-03 NOTE — Discharge Summary (Signed)
Physician Discharge Summary  Jacob [name] KCL:[id number] DOB: [birth year] DOA: 11/27/2015  PCP: [facility] date: 11/27/2015 Discharge date: 12/03/2015  Time spent: > 35 minutes  Recommendations for Outpatient Follow-up:  1. Monitor BMP within 1 wk 2. Consider titrating lasix dose pending serum creatinine levels and or edema/sob   Discharge Diagnoses:  Principal Problem:   Acute on chronic diastolic CHF (congestive heart failure) (HCC) Active Problems:   CAD (coronary artery disease)   Insulin dependent diabetes mellitus (HCC)   Rheumatoid arthritis (HCC)   OSA (obstructive sleep apnea)   AKI (acute kidney injury) ([place])   Chronic kidney disease (CKD), stage IV (severe) (HCC)   Normocytic anemia   Discharge Condition: stable  Diet recommendation: heart healthy/diabetic diet  Filed Weights   12/01/15 0335 12/02/15 0636 12/03/15 0548  Weight: 82.8 kg (182 lb 8.7 oz) 74.9 kg (165 lb 1.6 oz) 74.9 kg (165 lb 3.2 oz)    History of present illness:  75 y.o.with a history of COPD, CAD status post CABG, CAD stage IV, rheumatoid arthritis, chronic diastolic congestive heart failure that presented with dyspnea, lower extremity edema, generalized weakness and found to be in acute heart failure exacerbation.  Hospital Course:  Acute on chronic systolic and diastolic CHF- started on IV lasix 40 mg BID, with appropriate diuresis, with net fluid balance of - 7.3 liters.  Reviewed echo and chest x-ray results: Echocardiogram showed EF 49-44% with diastolic dysfunction.   - was on lasix 40 mg po daily but serum creatinine had slight bump. As such will d/c on lasix 20 mg po daily. Recommending reassessing serum creatinine levels and titrating lasix accordingly.  Coronary artery disease- stable EKG showed no ischemic changes. Troponin mild elevation 0.04. Likely from demand ischemia. Continue metoprolol, atorvastatin.   Diabetes mellitus-  CBG (last 3)   Recent Labs (last  2 labs)    Recent Labs  12/01/15 2033 12/02/15 0801 12/02/15 1245  GLUCAP 147* 118* 195*      Continue prior to admission insulin regimen.  Acute on CKD stage IV- creatinine stable. Lasix dose decreased on d/c to 20 mg po daily  Rheumatoid arthritis- stable, continue prednisone 7 mg by mouth daily.   Obstructive sleep apnea- stable continue CPAP at bedtime  Chronic anemia- at baseline on last check.   Hypokalemia: resolved  Acute encephalopathy/ Confusion: Most likely due to trazodone which has been discontinued. But could also be hospital induced delirium. Ct of head negative as cause Pt is more alert and oriented today   Procedures:  Echocardiogram  CT of head  Consultations:  none  Discharge Exam: Vitals:   12/03/15 1126 12/03/15 1500  BP: (!) 175/69 128/66  Pulse: 60 (!) 53  Resp: 18   Temp: 98.4 F (36.9 C)     General: Pt in nad, alert and awake Cardiovascular: rrr, no rubs Respiratory: no increase wob, no wheezes  Discharge Instructions   Discharge Instructions    Call MD for:  extreme fatigue    Complete by:  As directed    Call MD for:  temperature >100.4    Complete by:  As directed    Diet - low sodium heart healthy    Complete by:  As directed    Discharge instructions    Complete by:  As directed    Please be sure to follow-up with her primary care physician at facility. Recommend reassessing BMP in the next 3 days. Patient should have his Lasix dose adjusted should  he develop any further swelling or shortness of breath. Note patient had altered mental status with trazodone as such would avoid   Increase activity slowly    Complete by:  As directed      Current Discharge Medication List    START taking these medications   Details  furosemide (LASIX) 20 MG tablet Take 1 tablet (20 mg total) by mouth daily. Qty: 30 tablet, Refills: 0      CONTINUE these medications which have CHANGED   Details  ipratropium-albuterol  (DUONEB) 0.5-2.5 (3) MG/3ML SOLN Take 3 mLs by nebulization every 6 (six) hours as needed (shortness of breath/wheezing). Reported on 03/27/2015 Qty: 360 mL, Refills: 0    metoprolol (LOPRESSOR) 50 MG tablet Take 1 tablet (50 mg total) by mouth 2 (two) times daily.      CONTINUE these medications which have NOT CHANGED   Details  acetaminophen (TYLENOL) 325 MG tablet Take 2 tablets (650 mg total) by mouth every 6 (six) hours as needed for mild pain (or Fever >/= 101).    ACIDOPHILUS LACTOBACILLUS PO Take 2 tablets by mouth daily.    albuterol (PROVENTIL HFA;VENTOLIN HFA) 108 (90 BASE) MCG/ACT inhaler Inhale 2 puffs into the lungs every 6 (six) hours as needed for wheezing or shortness of breath. Qty: 1 Inhaler, Refills: 2    apixaban (ELIQUIS) 5 MG TABS tablet Take 5 mg by mouth 2 (two) times daily.    atorvastatin (LIPITOR) 20 MG tablet Take 1 tablet (20 mg total) by mouth daily at 6 PM. Qty: 30 tablet, Refills: 1    Brinzolamide-Brimonidine (SIMBRINZA) 1-0.2 % SUSP Place 1 drop into both eyes 2 (two) times daily.     budesonide-formoterol (SYMBICORT) 160-4.5 MCG/ACT inhaler Inhale 2 puffs into the lungs 2 (two) times daily.    calcitRIOL (ROCALTROL) 0.25 MCG capsule Take 0.25 mcg by mouth every Monday, Wednesday, and Friday.    Carboxymethylcellulose Sodium 1 % GEL Place 1 application into both eyes at bedtime.    cycloSPORINE (RESTASIS) 0.05 % ophthalmic emulsion Place 1 drop into both eyes 2 (two) times daily.    finasteride (PROSCAR) 5 MG tablet Take 5 mg by mouth at bedtime.     fluticasone (FLONASE) 50 MCG/ACT nasal spray Place 1 spray into both nostrils daily.    hydrOXYzine (ATARAX/VISTARIL) 25 MG tablet Take 1 tablet (25 mg total) by mouth 3 (three) times daily. Qty: 30 tablet, Refills: 0    insulin aspart protamine- aspart (NOVOLOG MIX 70/30) (70-30) 100 UNIT/ML injection Inject 25 Units into the skin 2 (two) times daily.    Magnesium Oxide 420 MG TABS Take 420 mg by  mouth at bedtime.    mirtazapine (REMERON) 15 MG tablet Take 15 mg by mouth at bedtime.    montelukast (SINGULAIR) 10 MG tablet Take 1 tablet (10 mg total) by mouth at bedtime. Qty: 90 tablet, Refills: 3    Multiple Vitamins-Minerals (PRESERVISION AREDS 2 PO) Take 2 tablets by mouth daily.     naphazoline-pheniramine (NAPHCON-A) 0.025-0.3 % ophthalmic solution Place 1 drop into both eyes 4 (four) times daily as needed for irritation.    nitroGLYCERIN (NITROSTAT) 0.4 MG SL tablet Place 0.4 mg under the tongue every 5 (five) minutes as needed for chest pain.    pilocarpine (PILOCAR) 2 % ophthalmic solution Place 1 drop into both eyes 3 (three) times daily.    !! predniSONE (DELTASONE) 1 MG tablet Take 2 mg by mouth daily with breakfast. IN CONJUNCTION WITH ONE 5 MG TABLET  TO EQUAL A TOTAL OF 7 MILLIGRAMS    !! predniSONE (DELTASONE) 5 MG tablet Take 5 mg by mouth daily with breakfast. IN CONJUNCTION WITH TWO 1 MG TABLETS TO EQUAL A TOTAL OF 7 MILLIGRAMS    sodium chloride (OCEAN) 0.65 % SOLN nasal spray Place 2 sprays into both nostrils 3 (three) times daily.    Tamsulosin HCl (FLOMAX) 0.4 MG CAPS Take 0.4 mg by mouth at bedtime.     amiodarone (PACERONE) 200 MG tablet Take 1 tablet (200 mg total) by mouth daily. Qty: 30 tablet, Refills: 0    feeding supplement, GLUCERNA SHAKE, (GLUCERNA SHAKE) LIQD Take 237 mLs by mouth 3 (three) times daily between meals. Refills: 0     !! - Potential duplicate medications found. Please discuss with provider.    STOP taking these medications     sennosides-docusate sodium (SENOKOT-S) 8.6-50 MG tablet      sodium chloride 0.9 % nebulizer solution        Allergies  Allergen Reactions  . Ace Inhibitors Other (See Comments)    Probably nausea and vomiting per patient   . Actonel [Risedronate] Nausea And Vomiting  . Ciprocinonide [Fluocinolone] Other (See Comments)    Probably nausea and vomiting per patient  . Flunisolide Other (See  Comments)    Probably nausea and vomiting per patient   . Metformin And Related Other (See Comments)    Probably nausea and vomiting per patient   . Sertraline Other (See Comments)    Probably nausea and vomiting per patient   . Sulindac Other (See Comments)    Probably nausea and vomiting per patient   . Terazosin Other (See Comments)    Probably nausea and vomiting per patient    Contact information for after-discharge care    Destination    [facility] .   Specialty:  [place] information: [address] [place] [phone]               The results of significant diagnostics from this hospitalization (including imaging, microbiology, ancillary and laboratory) are listed below for reference.    Significant Diagnostic Studies: Dg Chest 1 View  Result Date: 12/01/2015 CLINICAL DATA:  Pulmonary edema.  Follow-up. EXAM: CHEST 1 VIEW COMPARISON:  11/27/2015 chest radiograph. FINDINGS: Sternotomy wires appear aligned and intact. CABG clips overlie the mediastinum. Stable cardiomediastinal silhouette with mild cardiomegaly and aortic atherosclerosis. No pneumothorax. Trace bilateral pleural effusions, decreased bilaterally. Stable mild pulmonary edema. IMPRESSION: 1. Stable mild congestive heart failure . 2. Trace bilateral pleural effusions, decreased bilaterally. Electronically Signed   By: Ilona Sorrel M.D.   On: 12/01/2015 16:57   Ct Head Wo Contrast  Result Date: 12/01/2015 CLINICAL DATA:  75 year old male with increasing altered mental status and confusion. Recent falls. EXAM: CT HEAD WITHOUT CONTRAST TECHNIQUE: Contiguous axial images were obtained from the base of the skull through the vertex without intravenous contrast. COMPARISON:  08/03/2015 head CT FINDINGS: Brain: No evidence of acute infarction, hemorrhage, hydrocephalus, extra-axial collection or mass lesion/mass effect. Atrophy, chronic  small-vessel white matter ischemic changes and remote left basal ganglia infarcts again noted again identified. Vascular: Intracranial atherosclerotic calcifications noted. Skull: Normal. Negative for fracture or focal lesion. Sinuses/Orbits: No acute finding. Other: None. IMPRESSION: No evidence of acute intracranial abnormality. Atrophy, chronic small-vessel white matter ischemic changes and remote left basal ganglia infarcts. Electronically Signed   By: Margarette Canada M.D.   On: 12/01/2015 16:10   Dg Chest  Portable 1 View  Result Date: 11/27/2015 CLINICAL DATA:  Worsening shortness of breath. EXAM: PORTABLE CHEST 1 VIEW COMPARISON:  Most recent radiographs 10/04/15, CT 07/27/2015 FINDINGS: Patient is post median sternotomy. Stable cardiomegaly. Small left pleural effusion, new from prior. Interstitial edema has increased. Streaky right basilar opacity may be scarring. No confluent airspace disease. No pneumothorax. IMPRESSION: Increased pulmonary edema and development small pleural effusion, suggesting CHF. Electronically Signed   By: Jeb Levering M.D.   On: 11/27/2015 21:37   Dg Hip Unilat With Pelvis 2-3 Views Left  Result Date: 12/01/2015 CLINICAL DATA:  Left hip pain after fall 5 days prior. EXAM: DG HIP (WITH OR WITHOUT PELVIS) 2-3V LEFT COMPARISON:  None. FINDINGS: No fracture or dislocation in the left hip. No pelvic fracture or diastasis. Degenerative changes in the visualized lower lumbar spine. Extensive vascular calcifications throughout the soft tissues. IMPRESSION: No fracture. Electronically Signed   By: Ilona Sorrel M.D.   On: 12/01/2015 16:59    Microbiology: Recent Results (from the past 240 hour(s))  MRSA PCR Screening     Status: Abnormal   Collection Time: 11/28/15  8:12 PM  Result Value Ref Range Status   MRSA by PCR POSITIVE (A) NEGATIVE Final    Comment:        The GeneXpert MRSA Assay (FDA approved for NASAL specimens only), is one component of a comprehensive MRSA  colonization surveillance program. It is not intended to diagnose MRSA infection nor to guide or monitor treatment for MRSA infections. RESULT CALLED TO, READ BACK BY AND VERIFIED WITH: Arville Care RN [id number] 11/28/15 A BROWNING   Culture, Urine     Status: Abnormal   Collection Time: 12/02/15 10:07 AM  Result Value Ref Range Status   Specimen Description URINE, RANDOM  Final   Special Requests NONE  Final   Culture MULTIPLE SPECIES PRESENT, SUGGEST RECOLLECTION (A)  Final   Report Status 12/03/2015 FINAL  Final     Labs: Basic Metabolic Panel:  Recent Labs Lab 11/29/15 0442 11/30/15 0215 12/01/15 0234 12/02/15 0238 12/03/15 0249  NA 141 139 140 142 141  K 3.9 3.6 3.1* 3.3* 4.9  CL 104 103 99* 102 99*  CO2 26 30 32 30 31  GLUCOSE 169* 241* 194* 121* 252*  BUN 22* 22* 19 20 27*  CREATININE 1.84* 1.78* 1.81* 1.81* 2.10*  CALCIUM 9.2 9.0 9.3 9.4 9.4   Liver Function Tests: No results for input(s): AST, ALT, ALKPHOS, BILITOT, PROT, ALBUMIN in the last 168 hours. No results for input(s): LIPASE, AMYLASE in the last 168 hours. No results for input(s): AMMONIA in the last 168 hours. CBC:  Recent Labs Lab 11/27/15 2137 11/30/15 0215 12/01/15 0234  WBC 6.4 6.1 7.0  NEUTROABS 4.7  --   --   HGB 9.0* 8.3* 9.3*  HCT 29.2* 27.1* 30.1*  MCV 98.6 98.2 97.4  PLT 270 275 278   Cardiac Enzymes:  Recent Labs Lab 11/28/15 0105 11/28/15 0616 11/28/15 1242  TROPONINI 0.04* 0.04* 0.04*   BNP: BNP (last 3 results)  Recent Labs  08/03/15 1203 10/01/15 2147 11/27/15 2135  BNP 265.8* 1,601.7* >4,500.0*    ProBNP (last 3 results) No results for input(s): PROBNP in the last 8760 hours.  CBG:  Recent Labs Lab 12/02/15 0801 12/02/15 1245 12/02/15 1706 12/02/15 2126 12/03/15 0644  GLUCAP 118* 195* 269* 282* 183*    Signed:  Velvet Bathe MD.  [facility] 12/03/2015, 3:19 PM

## 2015-12-03 NOTE — Consult Note (Addendum)
   [facility] CM Inpatient Consult   12/03/2015  Jacob [name] 04/12/1940 [id number]   Met with the patient and granddaughter Jacob [name] regarding the benefits of [facility] services. Patient is a 75 y.o.with a history of COPD, CAD status post CABG, CAD stage IV, rheumatoid arthritis, chronic diastolic congestive heart failure that presented with dyspnea, lower extremity edema, generalized weakness and found to be in acute heart failure exacerbation.     Explained that [facility] is a covered benefit of his health [employer] insurance plan at no additional cost. Review information for [facility] Care Management and a folder was provided with contact information.  Explained that [facility] does not interfere with or replace any services arranged by the inpatient care management staff.  Patient request to review the information in the folder before deciding.  Currently the plan is to go to a skilled facility, patient has 29 Medicare days.  Patient's granddaughter speaks of [employer], as well.  Encouraged to call if patient would benefit from care management services for transition from a facility back home.  For questions, please contact:  Natividad Brood, RN BSN [facility] Liaison  [phone] business mobile phone Toll free office [phone]  Addendum:  Late entry 12/03/15 1400:  Message received from inpatient CSW that the patient and family would like [facility] to follow for transition from skilled to community especially since he has limited skilled days.  Will have [facility] CSW assigned.  Patient DC'd to [facility] 12/03/15

## 2015-12-03 NOTE — Clinical Social Work Placement (Signed)
   CLINICAL SOCIAL WORK PLACEMENT  NOTE  Date:  12/03/2015  Patient Details  Name: Jacob [name] MRN: [id number] Date of Birth: 05-16-1940  Clinical Social Work is seeking post-discharge placement for this patient at the [place] level of care (*CSW will initial, date and re-position this form in  chart as items are completed):  Yes   Patient/family provided with [place] Work Department's list of facilities offering this level of care within the geographic area requested by the patient (or if unable, by the patient's family).  Yes   Patient/family informed of their freedom to choose among providers that offer the needed level of care, that participate in Medicare, Medicaid or managed care program needed by the patient, have an available bed and are willing to accept the patient.  Yes   Patient/family informed of [facility]'s ownership interest in [facility] and [facility], as well as of the fact that they are under no obligation to receive care at these facilities.  PASRR submitted to EDS on 11/28/15     PASRR number received on       Existing PASRR number confirmed on 11/28/15     FL2 transmitted to all facilities in geographic area requested by pt/family on 11/28/15     FL2 transmitted to all facilities within larger geographic area on       Patient informed that his/her managed care company has contracts with or will negotiate with certain facilities, including the following:        Yes   Patient/family informed of bed offers received.  Patient chooses bed at [facility]     Physician recommends and patient chooses bed at      Patient to be transferred to [facility] on 12/03/15.  Patient to be transferred to facility by [facility]     Patient family notified on 12/03/15 of transfer.  Name of family member notified:  Adonis Huguenin     PHYSICIAN Please prepare prescriptions     Additional Comment:     _______________________________________________ Candie Chroman, LCSW 12/03/2015, 2:30 PM

## 2015-12-03 NOTE — Clinical Social Work Note (Addendum)
Per MD report, patient medically stable for discharge today. Granddaughter aware and CSW called patient's wife to notify her. [name] stated that she was not signing any discharge paperwork because he was not ready to go yet with the medical team "finding something in his urine." [name] has said several times that the [place] is paying for his stay and they told her not to let the hospital discharge him until he had a bed at the facility he wants. CSW paged MD and he again stated that patient stable for discharge. [facility] still does not have any male beds available and [facility] requires about $160 per day for two weeks up front. This amounts to approximately $2,240. CSW provided list of facilities that are still pending. Patient's granddaughter would like CSW to check with [facility] first and [facility] second. CSW paged [place], Anderson [place] Mischler [phone]). Last week she stated that paging her was the best way to get in touch with her.  Dayton Scrape, [place] [phone]  12:07 pm [facility]. She stated that she did not tell patient's wife that he could not discharge unless he got a bed at the facility they wanted. She told patient's wife to make sure staff knew that he could not go home with her because she could not physically care for him at this time. [facility] CSW also told her that she may have to send him to a facility he does not like and patient's wife expressed understanding about this. CSW got in contact with Clydene Fake admissions coordinator who is reviewing insurance information and will call CSW back with answer regarding bed offer.  Dayton Scrape, [place] [phone]  12:52 pm All SNF referrals that are still pending are not in network with [facility]. CSW faxed out to [facility]. CSW called admissions coordinator. Did not leave voicemail as voicemail is full.  Dayton Scrape, [employer] [phone]  2:15 pm [name], via telephone,  agreeable to [facility]. Facility notified and will contact family regarding paperwork. Insurance authorization start with [employer]  Dayton Scrape, [place] [phone]  2:31 pm CSW paged MD to let him know that patient's family had chosen [facility]. Asked that discharge summary and order be in before 4:30 pm.  4:23 pm [facility] does not have authorization yet but said they would get it and to go ahead and get the patient ready.  Dayton Scrape, [place]

## 2015-12-03 NOTE — Progress Notes (Signed)
Inpatient Diabetes Program Recommendations  AACE/ADA: New Consensus Statement on Inpatient Glycemic Control (2015)  Target Ranges:  Prepandial:   less than 140 mg/dL      Peak postprandial:   less than 180 mg/dL (1-2 hours)      Critically ill patients:  140 - 180 mg/dL   Results for YON, SCHIFFMAN (MRN [id number]) as of 12/03/2015 11:24  Ref. Range 12/02/2015 08:01 12/02/2015 12:45 12/02/2015 17:06 12/02/2015 21:26 12/03/2015 06:44  Glucose-Capillary Latest Ref Range: 65 - 99 mg/dL 118 (H) 195 (H) 269 (H) 282 (H) 183 (H)   Review of Glycemic Control  Current orders for Inpatient glycemic control: Latnus 10 units daily before supper, Novolog 0-15 units TID with meals  Inpatient Diabetes Program Recommendations: Correction (SSI): Please add Novolog BEDTIME correction scale. Insulin - Meal Coverage: Please consider ordering Novolog 3 units TID with meals if patient is eating at least 50% of meal.  Thanks, Barnie Alderman, RN, MSN, CDE Diabetes Coordinator Inpatient Diabetes Program [phone] (Team Pager from [place] to [place]) [phone] ([facility] office) [phone] [facility] office) [phone] [facility] office)

## 2015-12-03 NOTE — Progress Notes (Signed)
Pt has orders to be discharged to [facility]. Report called.  Telemetry box removed. IV removed and site in good condition. Pt stable and waiting for transportation.   Maurene Capes RN

## 2015-12-04 ENCOUNTER — Encounter: Payer: Self-pay | Admitting: Internal Medicine

## 2015-12-04 ENCOUNTER — Encounter: Payer: Self-pay | Admitting: *Deleted

## 2015-12-04 ENCOUNTER — Non-Acute Institutional Stay (SKILLED_NURSING_FACILITY): Payer: PPO | Admitting: Internal Medicine

## 2015-12-04 ENCOUNTER — Other Ambulatory Visit: Payer: Self-pay

## 2015-12-04 DIAGNOSIS — I10 Essential (primary) hypertension: Secondary | ICD-10-CM | POA: Diagnosis not present

## 2015-12-04 DIAGNOSIS — N2581 Secondary hyperparathyroidism of renal origin: Secondary | ICD-10-CM | POA: Diagnosis not present

## 2015-12-04 DIAGNOSIS — I5030 Unspecified diastolic (congestive) heart failure: Secondary | ICD-10-CM | POA: Diagnosis not present

## 2015-12-04 DIAGNOSIS — R5381 Other malaise: Secondary | ICD-10-CM | POA: Diagnosis not present

## 2015-12-04 DIAGNOSIS — N184 Chronic kidney disease, stage 4 (severe): Secondary | ICD-10-CM

## 2015-12-04 DIAGNOSIS — J449 Chronic obstructive pulmonary disease, unspecified: Secondary | ICD-10-CM

## 2015-12-04 DIAGNOSIS — I2510 Atherosclerotic heart disease of native coronary artery without angina pectoris: Secondary | ICD-10-CM

## 2015-12-04 DIAGNOSIS — Z9989 Dependence on other enabling machines and devices: Secondary | ICD-10-CM

## 2015-12-04 DIAGNOSIS — E7849 Other hyperlipidemia: Secondary | ICD-10-CM

## 2015-12-04 DIAGNOSIS — E440 Moderate protein-calorie malnutrition: Secondary | ICD-10-CM | POA: Diagnosis not present

## 2015-12-04 DIAGNOSIS — IMO0001 Reserved for inherently not codable concepts without codable children: Secondary | ICD-10-CM

## 2015-12-04 DIAGNOSIS — D638 Anemia in other chronic diseases classified elsewhere: Secondary | ICD-10-CM

## 2015-12-04 DIAGNOSIS — I5033 Acute on chronic diastolic (congestive) heart failure: Secondary | ICD-10-CM

## 2015-12-04 DIAGNOSIS — M069 Rheumatoid arthritis, unspecified: Secondary | ICD-10-CM | POA: Diagnosis not present

## 2015-12-04 DIAGNOSIS — G9340 Encephalopathy, unspecified: Secondary | ICD-10-CM

## 2015-12-04 DIAGNOSIS — I4891 Unspecified atrial fibrillation: Secondary | ICD-10-CM | POA: Diagnosis not present

## 2015-12-04 DIAGNOSIS — E876 Hypokalemia: Secondary | ICD-10-CM

## 2015-12-04 DIAGNOSIS — Z794 Long term (current) use of insulin: Secondary | ICD-10-CM

## 2015-12-04 DIAGNOSIS — E119 Type 2 diabetes mellitus without complications: Secondary | ICD-10-CM

## 2015-12-04 DIAGNOSIS — E784 Other hyperlipidemia: Secondary | ICD-10-CM | POA: Diagnosis not present

## 2015-12-04 DIAGNOSIS — N400 Enlarged prostate without lower urinary tract symptoms: Secondary | ICD-10-CM

## 2015-12-04 DIAGNOSIS — G4733 Obstructive sleep apnea (adult) (pediatric): Secondary | ICD-10-CM

## 2015-12-04 NOTE — Clinical Social Work Note (Signed)
[name] and son, Jacob [name] came to hospital. [name] is saying patient has no more SNF days left. CSW called [facility] and they will call the facility. [name] states that patient may need long-term care. Her daughter and granddaughter were supposed to start his Medicaid application but it fell through. CSW called financial counseling and it looks like the application isn't even pending due to there not being a temporary ID number. [name] and son do not want patient's granddaughters Susie and Benjamine Mola or patient's daughter Hilda Blades involved due to issues with his hospitalizations in the past. CSW will delete their phone numbers from chart, per wife's request and add new phone numbers for other family members. CSW asked admissions coordinator at [facility] to notify family when male bed becomes available. [facility]aged [facility] to let her know where patient discharged to.  Dayton Scrape, [place]

## 2015-12-04 NOTE — Progress Notes (Signed)
LOCATION: [name]  PCP: [facility]   Code Status: Full Code  Goals of care: Advanced Directive information Advanced Directives 11/27/2015  Does patient have an advance directive? Yes  Type of Advance Directive [place]  Does patient want to make changes to advanced directive? -  Copy of advanced directive(s) in chart? No - copy requested  Would patient like information on creating an advanced directive? -       Extended Emergency Contact Information Primary Emergency Contact: Bellantoni,Vivian Address: [address]          [place], [place] [address] [place] Phone: [phone] Mobile Phone: [phone] Relation: Spouse Secondary Emergency Contact: Ruthy Dick [place] Mobile Phone: [phone] Relation: Son   Allergies  Allergen Reactions  . Ace Inhibitors Other (See Comments)    Probably nausea and vomiting per patient   . Actonel [Risedronate] Nausea And Vomiting  . Ciprocinonide [Fluocinolone] Other (See Comments)    Probably nausea and vomiting per patient  . Flunisolide Other (See Comments)    Probably nausea and vomiting per patient   . Metformin And Related Other (See Comments)    Probably nausea and vomiting per patient   . Sertraline Other (See Comments)    Probably nausea and vomiting per patient   . Sulindac Other (See Comments)    Probably nausea and vomiting per patient   . Terazosin Other (See Comments)    Probably nausea and vomiting per patient     Chief Complaint  Patient presents with  . New Admit To SNF    New Admission     HPI:  Patient is a 75 y.o. male seen today for short term rehabilitation post hospital admission from 11/27/15-12/03/15 with acute on chronic diastolic CHF. He received iv diuresis and was diuresed 7.3 litres. He had acute encephalopathy and his trazodone was discontinued. There was concern for hospital induced delirium. CT head was  negative for acute intracranial abnormalities. He has PMH of CAD, DM, RA, OSA, CKD stage 4 among others. She is seen in her room today.   Review of Systems:  Constitutional: Negative for fever, chills and diaphoresis. Feels weak and tired. HENT: Negative for headache, congestion, nasal discharge Eyes: Negative for blurred vision  Respiratory: Negative for cough, shortness of breath  Cardiovascular: Negative for chest pain Gastrointestinal: Negative for heartburn, nausea, vomiting, abdominal pain. Last bowel movement was yesterday. Genitourinary: Negative for dysuria Musculoskeletal: Negative for back pain, fall in the facility.  Skin: Negative for itching, rash.  Neurological: Negative for dizziness.    Past Medical History:  Diagnosis Date  . Allergy   . Anemia   . Anxiety   . Arthritis   . Asthma   . BPH (benign prostatic hyperplasia)   . Cancer of kidney ([place])   . Cataract   . CHF (congestive heart failure) ([place])   . Chronic kidney disease    chronic  kidney failure  kidney function at 42%  . COPD (chronic obstructive pulmonary disease) ([place])   . Coronary artery disease    CABG  7 bypasses  . Diabetes mellitus without complication ([place])   . Hepatitis    many years ago  . Hypertension   . Polymyalgia rheumatica (HCC)    maintained on Prednisone, Plaquenil. Followed by rhuematology every 4 months/[name].  . Shortness of breath dyspnea    with exertion  . Sleep apnea    CPAP   Trying to use  Past Surgical History:  Procedure Laterality Date  . APPENDECTOMY    . CATARACT EXTRACTION W/PHACO Right 11/28/2014   Procedure: CATARACT EXTRACTION PHACO AND INTRAOCULAR LENS PLACEMENT ([place]) RIGHT ;  Surgeon: Marylynn Pearson, MD;  Location: [facility];  Service: Ophthalmology;  Laterality: Right;  . CORONARY ARTERY BYPASS GRAFT  03/17/1995   Wynonia Lawman; followed every six months.  Marland Kitchen HERNIA REPAIR    . LUMBAR LAMINECTOMY/DECOMPRESSION MICRODISCECTOMY N/A 07/30/2015   Procedure: LUMBAR  LAMINECTOMY DISCECTOMY ;  Surgeon: Ashok Pall, MD;  Location: [facility];  Service: Neurosurgery;  Laterality: N/A;  LUMBAR LAMINECTOMY DISCECTOMY   . LUMBAR LAMINECTOMY/DECOMPRESSION MICRODISCECTOMY N/A 09/06/2015   Procedure: Redo L3/4 Disectomy;  Surgeon: Ashok Pall, MD;  Location: [facility];  Service: Neurosurgery;  Laterality: N/A;  . PROSTATE SURGERY     TURP at [place].   Social History:   reports that he quit smoking about 58 years ago. He has never used smokeless tobacco. He reports that he does not drink alcohol or use drugs.  Family History  Problem Relation Age of Onset  . Hypertension Other     Medications:   Medication List       Accurate as of 12/04/15  2:29 PM. Always use your most recent med list.          acetaminophen 325 MG tablet Commonly known as:  TYLENOL Take 2 tablets (650 mg total) by mouth every 6 (six) hours as needed for mild pain (or Fever >/= 101).   ACIDOPHILUS LACTOBACILLUS PO Take 2 tablets by mouth daily.   albuterol 108 (90 Base) MCG/ACT inhaler Commonly known as:  PROVENTIL HFA;VENTOLIN HFA Inhale 2 puffs into the lungs every 6 (six) hours as needed for wheezing or shortness of breath.   amiodarone 200 MG tablet Commonly known as:  PACERONE Take 1 tablet (200 mg total) by mouth daily.   apixaban 5 MG Tabs tablet Commonly known as:  ELIQUIS Take 5 mg by mouth 2 (two) times daily.   atorvastatin 20 MG tablet Commonly known as:  LIPITOR Take 1 tablet (20 mg total) by mouth daily at 6 PM.   budesonide-formoterol 160-4.5 MCG/ACT inhaler Commonly known as:  SYMBICORT Inhale 2 puffs into the lungs 2 (two) times daily.   calcitRIOL 0.25 MCG capsule Commonly known as:  ROCALTROL Take 0.25 mcg by mouth every Monday, Wednesday, and Friday.   Carboxymethylcellulose Sodium 1 % Gel Place 1 application into both eyes at bedtime.   cycloSPORINE 0.05 % ophthalmic emulsion Commonly known as:  RESTASIS Place 1 drop into both eyes 2 (two)  times daily.   finasteride 5 MG tablet Commonly known as:  PROSCAR Take 5 mg by mouth at bedtime.   fluticasone 50 MCG/ACT nasal spray Commonly known as:  FLONASE Place 1 spray into both nostrils daily.   furosemide 20 MG tablet Commonly known as:  LASIX Take 1 tablet (20 mg total) by mouth daily.   hydrOXYzine 25 MG tablet Commonly known as:  ATARAX/VISTARIL Take 1 tablet (25 mg total) by mouth 3 (three) times daily.   insulin aspart protamine- aspart (70-30) 100 UNIT/ML injection Commonly known as:  NOVOLOG MIX 70/30 Inject 25 Units into the skin 2 (two) times daily.   ipratropium-albuterol 0.5-2.5 (3) MG/3ML Soln Commonly known as:  DUONEB Take 3 mLs by nebulization every 6 (six) hours as needed (shortness of breath/wheezing). Reported on 03/27/2015   magnesium oxide 400 MG tablet Commonly known as:  MAG-OX Take 400 mg by mouth at bedtime.  metoprolol 50 MG tablet Commonly known as:  LOPRESSOR Take 1 tablet (50 mg total) by mouth 2 (two) times daily.   mirtazapine 15 MG tablet Commonly known as:  REMERON Take 15 mg by mouth at bedtime.   montelukast 10 MG tablet Commonly known as:  SINGULAIR Take 1 tablet (10 mg total) by mouth at bedtime.   naphazoline-pheniramine 0.025-0.3 % ophthalmic solution Commonly known as:  NAPHCON-A Place 1 drop into both eyes 4 (four) times daily as needed for irritation.   nitroGLYCERIN 0.4 MG SL tablet Commonly known as:  NITROSTAT Place 0.4 mg under the tongue every 5 (five) minutes as needed for chest pain.   pilocarpine 2 % ophthalmic solution Commonly known as:  PILOCAR Place 1 drop into both eyes 3 (three) times daily.   predniSONE 5 MG tablet Commonly known as:  DELTASONE Take 5 mg by mouth daily with breakfast. IN CONJUNCTION WITH TWO 1 MG TABLETS TO EQUAL A TOTAL OF 7 MILLIGRAMS   predniSONE 1 MG tablet Commonly known as:  DELTASONE Take 2 mg by mouth daily with breakfast. IN CONJUNCTION WITH ONE 5 MG TABLET TO EQUAL  A TOTAL OF 7 MILLIGRAMS   PRESERVISION AREDS 2 PO Take 2 tablets by mouth daily.   SIMBRINZA 1-0.2 % Susp Generic drug:  Brinzolamide-Brimonidine Place 1 drop into both eyes 2 (two) times daily.   sodium chloride 0.65 % Soln nasal spray Commonly known as:  OCEAN Place 2 sprays into both nostrils 3 (three) times daily.   tamsulosin 0.4 MG Caps capsule Commonly known as:  FLOMAX Take 0.4 mg by mouth at bedtime.       Immunizations: Immunization History  Administered Date(s) Administered  . PPD Test 08/09/2015  . Pneumococcal Conjugate-13 01/24/2014     Physical Exam: Vitals:   12/04/15 1423  BP: (!) 147/80  Pulse: 67  Resp: 18  Temp: 97.7 F (36.5 C)  TempSrc: Oral  SpO2: 98%  Weight: 165 lb 3.2 oz (74.9 kg)  Height: 5\' 11"  (1.803 m)   Body mass index is 23.04 kg/m.  General- elderly male, well built, in no acute distress Head- normocephalic, atraumatic Nose- no nasal discharge Throat- moist mucus membrane  Eyes- PERRLA, EOMI, no pallor, no icterus Neck- no cervical lymphadenopathy Cardiovascular- normal s1,s2, no murmur, no leg edema Respiratory- bilateral clear to auscultation, no wheeze, no rhonchi, no crackles, no use of accessory muscles Abdomen- bowel sounds present, soft, non tender Musculoskeletal- able to move all 4 extremities Neurological- alert and oriented to person and place but somewhat confused. Falling asleep in between conversation Skin- warm and dry, bruise to both arms    Labs reviewed: Basic Metabolic Panel:  Recent Labs  07/29/15 0317 07/30/15 0635  08/29/15 1034  10/10/15 0504 10/11/15 0545  10/13/15 0550  12/01/15 0234 12/02/15 0238 12/03/15 0249  NA 141 138  < > 140  < > 143 141  < > 140  < > 140 142 141  K 4.1 4.5  < > 3.5  < > 3.5 3.7  < > 3.7  < > 3.1* 3.3* 4.9  CL 107 100*  < > 106  < > 108 106  < > 106  < > 99* 102 99*  CO2 26 26  < > 25  < > 24 23  < > 26  < > 32 30 31  GLUCOSE 52* 310*  < > 184*  < > 117* 166*   < > 193*  < > 194* 121* 252*  BUN 25* 26*  < > 12  < > 8 10  < > 10  < > 19 20 27*  CREATININE 1.49* 1.63*  < > 1.44*  < > 1.64* 1.53*  < > 1.52*  < > 1.81* 1.81* 2.10*  CALCIUM 9.0 9.0  < > 9.0  < > 8.4* 8.6*  < > 8.4*  < > 9.3 9.4 9.4  MG 2.2 2.0  --  1.6*  < > 1.4* 2.2  --  1.8  --   --   --   --   PHOS 3.0 3.1  --  3.8  --   --   --   --   --   --   --   --   --   < > = values in this interval not displayed. Liver Function Tests:  Recent Labs  10/11/15 0545 10/12/15 0422 10/13/15 0550  AST 37 21 32  ALT 20 23 22   ALKPHOS 139* 118 112  BILITOT 1.9* 1.3* 1.3*  PROT 5.6* 4.9* 4.9*  ALBUMIN 2.5* 2.3* 2.4*   No results for input(s): LIPASE, AMYLASE in the last 8760 hours.  Recent Labs  08/28/15 2313 09/06/15 0559  AMMONIA 28 13   CBC:  Recent Labs  08/28/15 2312  10/01/15 2145  11/27/15 2137 11/30/15 0215 12/01/15 0234  WBC 6.4  < > 1.6*  < > 6.4 6.1 7.0  NEUTROABS 3.8  --  0.1*  --  4.7  --   --   HGB 11.8*  < > 10.1*  < > 9.0* 8.3* 9.3*  HCT 37.5*  < > 31.0*  < > 29.2* 27.1* 30.1*  MCV 95.2  < > 90.6  < > 98.6 98.2 97.4  PLT 254  < > 218  < > 270 275 278  < > = values in this interval not displayed. Cardiac Enzymes:  Recent Labs  11/28/15 0105 11/28/15 0616 11/28/15 1242  TROPONINI 0.04* 0.04* 0.04*   BNP: Invalid input(s): POCBNP CBG:  Recent Labs  12/03/15 0644 12/03/15 1124 12/03/15 1634  GLUCAP 183* 247* 320*   Lab Results  Component Value Date   HGBA1C 6.8 (H) 08/29/2015   Lab Results  Component Value Date   TSH 1.219 10/02/2015     Radiological Exams: Dg Chest 1 View  Result Date: 12/01/2015 CLINICAL DATA:  Pulmonary edema.  Follow-up. EXAM: CHEST 1 VIEW COMPARISON:  11/27/2015 chest radiograph. FINDINGS: Sternotomy wires appear aligned and intact. CABG clips overlie the mediastinum. Stable cardiomediastinal silhouette with mild cardiomegaly and aortic atherosclerosis. No pneumothorax. Trace bilateral pleural effusions, decreased  bilaterally. Stable mild pulmonary edema. IMPRESSION: 1. Stable mild congestive heart failure . 2. Trace bilateral pleural effusions, decreased bilaterally. Electronically Signed   By: Ilona Sorrel M.D.   On: 12/01/2015 16:57   Ct Head Wo Contrast  Result Date: 12/01/2015 CLINICAL DATA:  75 year old male with increasing altered mental status and confusion. Recent falls. EXAM: CT HEAD WITHOUT CONTRAST TECHNIQUE: Contiguous axial images were obtained from the base of the skull through the vertex without intravenous contrast. COMPARISON:  08/03/2015 head CT FINDINGS: Brain: No evidence of acute infarction, hemorrhage, hydrocephalus, extra-axial collection or mass lesion/mass effect. Atrophy, chronic small-vessel white matter ischemic changes and remote left basal ganglia infarcts again noted again identified. Vascular: Intracranial atherosclerotic calcifications noted. Skull: Normal. Negative for fracture or focal lesion. Sinuses/Orbits: No acute finding. Other: None. IMPRESSION: No evidence of acute intracranial abnormality. Atrophy, chronic small-vessel white matter ischemic changes and remote left  basal ganglia infarcts. Electronically Signed   By: Margarette Canada M.D.   On: 12/01/2015 16:10   Dg Chest Portable 1 View  Result Date: 11/27/2015 CLINICAL DATA:  Worsening shortness of breath. EXAM: PORTABLE CHEST 1 VIEW COMPARISON:  Most recent radiographs 10/04/15, CT 07/27/2015 FINDINGS: Patient is post median sternotomy. Stable cardiomegaly. Small left pleural effusion, new from prior. Interstitial edema has increased. Streaky right basilar opacity may be scarring. No confluent airspace disease. No pneumothorax. IMPRESSION: Increased pulmonary edema and development small pleural effusion, suggesting CHF. Electronically Signed   By: Jeb Levering M.D.   On: 11/27/2015 21:37   Dg Hip Unilat With Pelvis 2-3 Views Left  Result Date: 12/01/2015 CLINICAL DATA:  Left hip pain after fall 5 days prior. EXAM: DG HIP  (WITH OR WITHOUT PELVIS) 2-3V LEFT COMPARISON:  None. FINDINGS: No fracture or dislocation in the left hip. No pelvic fracture or diastasis. Degenerative changes in the visualized lower lumbar spine. Extensive vascular calcifications throughout the soft tissues. IMPRESSION: No fracture. Electronically Signed   By: Ilona Sorrel M.D.   On: 12/01/2015 16:59    Assessment/Plan  Physical deconditioning Will have him work with physical therapy and occupational therapy team to help with gait training and muscle strengthening exercises.fall precautions. Skin care. Encourage to be out of bed.   Chronic diastolic CHF S/p iv diuresis in hospital. Breathing stable for now. Continue lasix 20 mg daily and monitor bmp. Monitor daily weight. Continue lopressor 50 mg bid.   Acute encephalopathy Monitor his mental state. Fall precautions. Check tsh, a1c along with cbc and cmp to rule out metabolic cause.   afib Rate controlled. Continue metoprolol 50 mg bid, amiodarone 200 mg daily and apixaban for stroke prophylaxis  CAD Chest pain free. Continue prn NTG with metoprolol and statin  CKD stage 4 Monitor bmp  Protein calorie malnutrition RD consult, monitor po intake, skin care for pressure ulcer prophylaxis. Continue remeron  Anemia of chronic disease Monitor cbc  OSA Continue CPAP at bedtime  Hyperlipidemia Continue her atorvastatin  COPD Breathing stable. Continue singulair, symbicort and prn albuterol/ duoneb  Hypokalemia Monitor bmp and mg with her on lasix  Secondary hyperparathyroidism Continue calcitriol  HTN Monitor BP, continue metoprolol  BPH Continue home regimen finasteride and tamsulosin  RA Continue chronic prednisone, monitor joint pain  IDDM Monitor cbg, check a1c. Continue SSI novolog    Goals of care: short term rehabilitation   Labs/tests ordered: cbc, cmp, mg, a1c, tsh 12/05/15   Family/ staff Communication: reviewed care plan with patient and nursing  supervisor    Blanchie Serve, MD Internal Medicine [place], [place] [address] Cell Phone (Monday-Friday 8 am - 5 pm): [phone] On Call: [phone] and follow prompts after 5 pm and on weekends Office Phone: [phone] Office Fax: [phone]

## 2015-12-05 DIAGNOSIS — E08311 Diabetes mellitus due to underlying condition with unspecified diabetic retinopathy with macular edema: Secondary | ICD-10-CM | POA: Diagnosis not present

## 2015-12-05 DIAGNOSIS — E118 Type 2 diabetes mellitus with unspecified complications: Secondary | ICD-10-CM | POA: Diagnosis not present

## 2015-12-05 DIAGNOSIS — D508 Other iron deficiency anemias: Secondary | ICD-10-CM | POA: Diagnosis not present

## 2015-12-05 LAB — HEPATIC FUNCTION PANEL
ALT: 11 U/L (ref 10–40)
AST: 13 U/L — AB (ref 14–40)
Alkaline Phosphatase: 103 U/L (ref 25–125)
Bilirubin, Total: 1.1 mg/dL

## 2015-12-05 LAB — TSH: TSH: 2.73 u[IU]/mL (ref 0.41–5.90)

## 2015-12-05 LAB — CBC AND DIFFERENTIAL
HCT: 37 % — AB (ref 41–53)
Hemoglobin: 11.4 g/dL — AB (ref 13.5–17.5)
Platelets: 341 10*3/uL (ref 150–399)
WBC: 9.2 10^3/mL

## 2015-12-05 LAB — BASIC METABOLIC PANEL
BUN: 33 mg/dL — AB (ref 4–21)
Creatinine: 2 mg/dL — AB (ref 0.6–1.3)
Glucose: 119 mg/dL
Potassium: 3.7 mmol/L (ref 3.4–5.3)
Sodium: 142 mmol/L (ref 137–147)

## 2015-12-05 LAB — HEMOGLOBIN A1C: Hemoglobin A1C: 7.2

## 2015-12-10 ENCOUNTER — Encounter: Payer: Self-pay | Admitting: *Deleted

## 2015-12-10 ENCOUNTER — Other Ambulatory Visit: Payer: Self-pay | Admitting: *Deleted

## 2015-12-10 NOTE — Patient Outreach (Signed)
Jacob [name]) [name]  12/10/2015  Jacob [name] 1940-09-18 [id number]  CSW was able to make initial contact with patient today to perform the assessment, as well as assess and assist with social work needs and services, when Jacob [name] met with patient at [name], [place] where patient currently resides to receive short-term rehabilitative services.  CSW introduced self, explained role and types of services provided through [name] ([name]).  CSW further explained to patient that CSW works with patient's Primary Care Physician at the [name].  CSW then explained the reason for the visit, indicating that the clinic thought that patient would benefit from social work services and resources to assist with possible discharge planning needs and services from the skilled facility.  CSW obtained two HIPAA compliant identifiers from patient, which included patient's name and date of birth. CSW met with patient, patient's wife, Jacob [name], patient's son, Jacob [name] and Jacob [name], Licensed Clinical Social Curator at [facility]  Jacob [name] indicated that he is currently working on having patient moved to a long-term care assisted living facility in [place], where he and his family currently live. Jacob [name] would like for Jacob [name] to move into the assisted living facility with patient; however, she wishes to return home to live in their independent living apartment in [place].  Jacob [name] admits that she is no longer able to care for patient in the home, hence the reason for the 4 [name] admissions and skilled nursing placements within the last year.  Patient has agreed to consider moving to [place] with his son, but became tearful when talking about having to leave his wife of 80 years. CSW admitted to patient and his family that they will have some very difficult  decisions to make within the next few weeks, but that CSW is willing to assist with any and all discharge planning arrangements for patient.  CSW, along with Mrs. Jacob [name] have agreed to meet with patient and family again on October 10th to finalize discharge planning arrangements. [name], BSW, MSW, LCSW  Licensed Education officer, [facility]  Mailing [place] [address] [place], [place] [address] Physical Address-[address], [place], [place] [address] Toll Free Main # [phone] Fax # [phone] Cell # [phone]  Fax # [phone]  [name]

## 2015-12-15 DIAGNOSIS — R1312 Dysphagia, oropharyngeal phase: Secondary | ICD-10-CM | POA: Diagnosis not present

## 2015-12-15 DIAGNOSIS — R41841 Cognitive communication deficit: Secondary | ICD-10-CM | POA: Diagnosis not present

## 2015-12-15 DIAGNOSIS — M069 Rheumatoid arthritis, unspecified: Secondary | ICD-10-CM | POA: Diagnosis not present

## 2015-12-15 DIAGNOSIS — M6281 Muscle weakness (generalized): Secondary | ICD-10-CM | POA: Diagnosis not present

## 2015-12-15 DIAGNOSIS — I5032 Chronic diastolic (congestive) heart failure: Secondary | ICD-10-CM | POA: Diagnosis not present

## 2015-12-15 DIAGNOSIS — R531 Weakness: Secondary | ICD-10-CM | POA: Diagnosis not present

## 2015-12-19 ENCOUNTER — Non-Acute Institutional Stay (SKILLED_NURSING_FACILITY): Payer: PPO | Admitting: Family

## 2015-12-19 ENCOUNTER — Encounter: Payer: Self-pay | Admitting: Family

## 2015-12-19 DIAGNOSIS — N184 Chronic kidney disease, stage 4 (severe): Secondary | ICD-10-CM | POA: Diagnosis not present

## 2015-12-19 DIAGNOSIS — J449 Chronic obstructive pulmonary disease, unspecified: Secondary | ICD-10-CM | POA: Diagnosis not present

## 2015-12-19 DIAGNOSIS — R531 Weakness: Secondary | ICD-10-CM | POA: Diagnosis not present

## 2015-12-19 DIAGNOSIS — E119 Type 2 diabetes mellitus without complications: Secondary | ICD-10-CM

## 2015-12-19 DIAGNOSIS — Z794 Long term (current) use of insulin: Secondary | ICD-10-CM | POA: Diagnosis not present

## 2015-12-19 DIAGNOSIS — I5030 Unspecified diastolic (congestive) heart failure: Secondary | ICD-10-CM

## 2015-12-19 DIAGNOSIS — IMO0001 Reserved for inherently not codable concepts without codable children: Secondary | ICD-10-CM

## 2015-12-19 DIAGNOSIS — R2681 Unsteadiness on feet: Secondary | ICD-10-CM | POA: Diagnosis not present

## 2015-12-19 DIAGNOSIS — I2510 Atherosclerotic heart disease of native coronary artery without angina pectoris: Secondary | ICD-10-CM | POA: Diagnosis not present

## 2015-12-19 DIAGNOSIS — M353 Polymyalgia rheumatica: Secondary | ICD-10-CM

## 2015-12-19 NOTE — Progress Notes (Signed)
Location:  [facility] Room Number: [place]:  SNF [phone])  Provider: Marlowe Sax FNP-C   PCP: [facility] Patient Care Team: [facility] as PCP - General Juluis Rainier as Consulting Physician (Optometry) Francis Gaines, LCSW as [place] Management  Extended Emergency Contact Information Primary Emergency Contact: Lawless,Vivian Address: [address]          [place],  [address] Johnnette Litter of [place] Phone: [phone] Mobile Phone: [phone] Relation: Spouse Secondary Emergency Contact: Ruthy Dick [place] of [place] Mobile Phone: [phone] Relation: Son  Code Status: Full code  Goals of care:  Advanced Directive information Advanced Directives 12/19/2015  Does patient have an advance directive? No  Type of Advance Directive -  Does patient want to make changes to advanced directive? -  Copy of advanced directive(s) in chart? -  Would patient like information on creating an advanced directive? -     Allergies  Allergen Reactions  . Ace Inhibitors Other (See Comments)    Probably nausea and vomiting per patient   . Actonel [Risedronate] Nausea And Vomiting  . Ciprocinonide [Fluocinolone] Other (See Comments)    Probably nausea and vomiting per patient  . Flunisolide Other (See Comments)    Probably nausea and vomiting per patient   . Metformin And Related Other (See Comments)    Probably nausea and vomiting per patient   . Sertraline Other (See Comments)    Probably nausea and vomiting per patient   . Sulindac Other (See Comments)    Probably nausea and vomiting per patient   . Terazosin Other (See Comments)    Probably nausea and vomiting per patient     Chief Complaint  Patient presents with  . Discharge Note    HPI:  75 y.o. male  [facility] at [facility] for discharge home. He was here for short term  rehabilitation post hospital admission from 11/27/2015-12/03/2015 for acute on chronic CHF and CAD. He was started on IV Lasix 40 mg twice daily with net fluid balance of 7.3 liters. His Echo showed EF 41-74 % with diastolic dysfunction. Lasix was decreased to 20 mg Tablet daily due to high serum CR. EKG showed no ischemic changes though troponin were mildly elevated thought to be likely from demand ischemia. His BB and Statin was continued. He has a medical history of COPD, CAD, RA, OSA, CKD stage 4, anemia among other conditions. He is seen in his room today. He denies any acute issues this visit. His Blood pressure was elevated few days ago during Physical therapy exercise was stopped and patient returned to bed. Patient had skipped his blood pressure medication prior to therapy. B/P within normal range after taking scheduled B/p medication. He has continued to work with PT/OT now stable for discharge home. He will be discharge home with Home health PT/OT to continue with ROM, Exercise, Gait stability and muscle strengthening. He will need [facility] RN for insulin and medication management. He does not require any DME. Home health services will be arranged by facility social worker prior to discharge. Prescription medication will be written x 1 month then patient to follow up with PCP in 1-2 weeks. Facility staff report no new concerns.      Past Medical History:  Diagnosis Date  . Allergy   . Anemia   . Anxiety   . Arthritis   . Asthma   . BPH (benign prostatic  hyperplasia)   . Cancer of kidney ([place])   . Cataract   . CHF (congestive heart failure) ([place])   . Chronic kidney disease    chronic  kidney failure  kidney function at 42%  . COPD (chronic obstructive pulmonary disease) ([place])   . Coronary artery disease    CABG  7 bypasses  . Diabetes mellitus without complication ([place])   . Hepatitis    many years ago  . Hypertension   . Polymyalgia rheumatica (HCC)    maintained on Prednisone, Plaquenil.  Followed by rhuematology every 4 months/[name].  . Shortness of breath dyspnea    with exertion  . Sleep apnea    CPAP   Trying to use    Past Surgical History:  Procedure Laterality Date  . APPENDECTOMY    . CATARACT EXTRACTION W/PHACO Right 11/28/2014   Procedure: CATARACT EXTRACTION PHACO AND INTRAOCULAR LENS PLACEMENT ([place]) RIGHT ;  Surgeon: Marylynn Pearson, MD;  Location: [facility];  Service: Ophthalmology;  Laterality: Right;  . CORONARY ARTERY BYPASS GRAFT  03/17/1995   [place] Lawman; followed every six months.  Marland Kitchen HERNIA REPAIR    . LUMBAR LAMINECTOMY/DECOMPRESSION MICRODISCECTOMY N/A 07/30/2015   Procedure: LUMBAR LAMINECTOMY DISCECTOMY ;  Surgeon: Ashok Pall, MD;  Location: [facility];  Service: Neurosurgery;  Laterality: N/A;  LUMBAR LAMINECTOMY DISCECTOMY   . LUMBAR LAMINECTOMY/DECOMPRESSION MICRODISCECTOMY N/A 09/06/2015   Procedure: Redo L3/4 Disectomy;  Surgeon: Ashok Pall, MD;  Location: [facility];  Service: Neurosurgery;  Laterality: N/A;  . PROSTATE SURGERY     TURP at [place].      reports that he quit smoking about 58 years ago. He has never used smokeless tobacco. He reports that he does not drink alcohol or use drugs. Social History   Social History  . Marital status: Married    Spouse name: N/A  . Number of children: N/A  . Years of education: N/A   Occupational History  . Driver    Social History Main Topics  . Smoking status: Former Smoker    Quit date: 03/16/1957  . Smokeless tobacco: Never Used  . Alcohol use No  . Drug use: No  . Sexual activity: Not Currently    Birth control/ protection: None   Other Topics Concern  . Not on file   Social History Narrative   Marital status: Married x 55 years.      Children: 3 children; 5 grandchildren; 6 gg.      Lives: with wife.        Employment: retired; [employer].  Working every other week; 45 hours.      Tobacco:  Quit at age 77.      Alcohol:  None      Exercise: none      ADLs:  indepedent with ADLs;  Uses cane and has a walker.  Drives.      Advanced Directives:  +Living Will.  DNR/DNI.  Wife is HCPOA.      Allergies  Allergen Reactions  . Ace Inhibitors Other (See Comments)    Probably nausea and vomiting per patient   . Actonel [Risedronate] Nausea And Vomiting  . Ciprocinonide [Fluocinolone] Other (See Comments)    Probably nausea and vomiting per patient  . Flunisolide Other (See Comments)    Probably nausea and vomiting per patient   . Metformin And Related Other (See Comments)    Probably nausea and vomiting per patient   . Sertraline Other (See Comments)  Probably nausea and vomiting per patient   . Sulindac Other (See Comments)    Probably nausea and vomiting per patient   . Terazosin Other (See Comments)    Probably nausea and vomiting per patient     Pertinent  Health Maintenance Due  Topic Date Due  . FOOT EXAM  11/25/1950  . OPHTHALMOLOGY EXAM  11/14/2014  . URINE MICROALBUMIN  01/25/2015  . INFLUENZA VACCINE  10/15/2015  . PNA vac Low Risk Adult (2 of 2 - PPSV23) 03/16/2016 (Originally 01/25/2015)  . HEMOGLOBIN A1C  02/28/2016    Medications:   Medication List       Accurate as of 12/19/15  8:28 AM. Always use your most recent med list.          acetaminophen 325 MG tablet Commonly known as:  TYLENOL Take 2 tablets (650 mg total) by mouth every 6 (six) hours as needed for mild pain (or Fever >/= 101).   ACIDOPHILUS LACTOBACILLUS PO Take 2 tablets by mouth daily.   albuterol 108 (90 Base) MCG/ACT inhaler Commonly known as:  PROVENTIL HFA;VENTOLIN HFA Inhale 2 puffs into the lungs every 6 (six) hours as needed for wheezing or shortness of breath.   amiodarone 200 MG tablet Commonly known as:  PACERONE Take 1 tablet (200 mg total) by mouth daily.   apixaban 5 MG Tabs tablet Commonly known as:  ELIQUIS Take 5 mg by mouth 2 (two) times daily.   atorvastatin 20 MG tablet Commonly known as:  LIPITOR Take 1 tablet  (20 mg total) by mouth daily at 6 PM.   budesonide-formoterol 160-4.5 MCG/ACT inhaler Commonly known as:  SYMBICORT Inhale 2 puffs into the lungs 2 (two) times daily.   calcitRIOL 0.25 MCG capsule Commonly known as:  ROCALTROL Take 0.25 mcg by mouth every Monday, Wednesday, and Friday.   Carboxymethylcellulose Sodium 1 % Gel Place 1 application into both eyes at bedtime.   cycloSPORINE 0.05 % ophthalmic emulsion Commonly known as:  RESTASIS Place 1 drop into both eyes 2 (two) times daily.   finasteride 5 MG tablet Commonly known as:  PROSCAR Take 5 mg by mouth at bedtime.   fluticasone 50 MCG/ACT nasal spray Commonly known as:  FLONASE Place 1 spray into both nostrils daily.   furosemide 20 MG tablet Commonly known as:  LASIX Take 1 tablet (20 mg total) by mouth daily.   hydrOXYzine 25 MG tablet Commonly known as:  ATARAX/VISTARIL Take 1 tablet (25 mg total) by mouth 3 (three) times daily.   insulin aspart protamine- aspart (70-30) 100 UNIT/ML injection Commonly known as:  NOVOLOG MIX 70/30 Inject 25 Units into the skin 2 (two) times daily.   ipratropium-albuterol 0.5-2.5 (3) MG/3ML Soln Commonly known as:  DUONEB Take 3 mLs by nebulization every 6 (six) hours as needed (shortness of breath/wheezing). Reported on 03/27/2015   magnesium oxide 400 MG tablet Commonly known as:  MAG-OX Take 400 mg by mouth at bedtime.   metoprolol 50 MG tablet Commonly known as:  LOPRESSOR Take 1 tablet (50 mg total) by mouth 2 (two) times daily.   mirtazapine 15 MG tablet Commonly known as:  REMERON Take 15 mg by mouth at bedtime.   montelukast 10 MG tablet Commonly known as:  SINGULAIR Take 1 tablet (10 mg total) by mouth at bedtime.   naphazoline-pheniramine 0.025-0.3 % ophthalmic solution Commonly known as:  NAPHCON-A Place 1 drop into both eyes 4 (four) times daily as needed for irritation.   nitroGLYCERIN 0.4 MG SL  tablet Commonly known as:  NITROSTAT Place 0.4 mg under  the tongue every 5 (five) minutes as needed for chest pain.   pilocarpine 2 % ophthalmic solution Commonly known as:  PILOCAR Place 1 drop into both eyes 3 (three) times daily.   predniSONE 5 MG tablet Commonly known as:  DELTASONE Take 5 mg by mouth daily with breakfast. IN CONJUNCTION WITH TWO 1 MG TABLETS TO EQUAL A TOTAL OF 7 MILLIGRAMS   predniSONE 1 MG tablet Commonly known as:  DELTASONE Take 2 mg by mouth daily with breakfast. IN CONJUNCTION WITH ONE 5 MG TABLET TO EQUAL A TOTAL OF 7 MILLIGRAMS   PRESERVISION AREDS 2 PO Take 2 tablets by mouth daily.   SIMBRINZA 1-0.2 % Susp Generic drug:  Brinzolamide-Brimonidine Place 1 drop into both eyes 2 (two) times daily.   sodium chloride 0.65 % Soln nasal spray Commonly known as:  OCEAN Place 2 sprays into both nostrils 3 (three) times daily.   tamsulosin 0.4 MG Caps capsule Commonly known as:  FLOMAX Take 0.4 mg by mouth at bedtime.       Review of Systems  Constitutional: Negative for activity change, appetite change, chills, fatigue and fever.  HENT: Negative for congestion, rhinorrhea, sinus pressure, sneezing and sore throat.   Eyes: Negative for pain, discharge, redness and itching.  Respiratory: Negative for cough, chest tightness, shortness of breath and wheezing.   Cardiovascular: Negative for chest pain, palpitations and leg swelling.  Gastrointestinal: Negative for abdominal distention, abdominal pain, constipation, diarrhea, nausea and vomiting.  Endocrine: Negative.   Genitourinary: Negative for dysuria, flank pain, frequency and urgency.  Musculoskeletal: Positive for gait problem.  Skin: Negative for color change, pallor, rash and wound.  Neurological: Negative for dizziness, seizures, syncope, light-headedness and headaches.  Hematological: Does not bruise/bleed easily.  Psychiatric/Behavioral: Negative for agitation, confusion, hallucinations and sleep disturbance. The patient is not nervous/anxious.      Vitals:   12/19/15 0817  BP: 137/66  Pulse: 61  Resp: 20  Temp: 98.6 F (37 C)  SpO2: 98%  Weight: 170 lb 12.8 oz (77.5 kg)  Height: 5\' 11"  (1.803 m)   Body mass index is 23.82 kg/m. Physical Exam  Constitutional: He is oriented to person, place, and time. He appears well-developed and well-nourished. No distress.  HENT:  Head: Normocephalic.  Mouth/Throat: Oropharynx is clear and moist. No oropharyngeal exudate.  Eyes: Conjunctivae and EOM are normal. Pupils are equal, round, and reactive to light. Right eye exhibits no discharge. Left eye exhibits no discharge. No scleral icterus.  Neck: Normal range of motion. No JVD present. No thyromegaly present.  Cardiovascular: Normal rate, regular rhythm, normal heart sounds and intact distal pulses.  Exam reveals no gallop and no friction rub.   No murmur heard. Pulmonary/Chest: Effort normal and breath sounds normal. No respiratory distress. He has no wheezes. He has no rales.  Abdominal: Soft. Bowel sounds are normal. He exhibits no distension. There is no rebound and no guarding.  Musculoskeletal: He exhibits no edema, tenderness or deformity.  Moves X 4 extremities, Gait unsteady.   Lymphadenopathy:    He has no cervical adenopathy.  Neurological: He is oriented to person, place, and time.  Skin: Skin is warm and dry. No rash noted. No erythema. No pallor.  Psychiatric: He has a normal mood and affect.    Labs reviewed: Basic Metabolic Panel:  Recent Labs  07/29/15 0317 07/30/15 3810  08/29/15 1034  10/10/15 1751 10/11/15 0545  10/13/15 0550  12/01/15 0234 12/02/15 0238 12/03/15 0249 12/05/15  NA 141 138  < > 140  < > 143 141  < > 140  < > 140 142 141 142  K 4.1 4.5  < > 3.5  < > 3.5 3.7  < > 3.7  < > 3.1* 3.3* 4.9 3.7  CL 107 100*  < > 106  < > 108 106  < > 106  < > 99* 102 99*  --   CO2 26 26  < > 25  < > 24 23  < > 26  < > 32 30 31  --   GLUCOSE 52* 310*  < > 184*  < > 117* 166*  < > 193*  < > 194* 121* 252*   --   BUN 25* 26*  < > 12  < > 8 10  < > 10  < > 19 20 27* 33*  CREATININE 1.49* 1.63*  < > 1.44*  < > 1.64* 1.53*  < > 1.52*  < > 1.81* 1.81* 2.10* 2.0*  CALCIUM 9.0 9.0  < > 9.0  < > 8.4* 8.6*  < > 8.4*  < > 9.3 9.4 9.4  --   MG 2.2 2.0  --  1.6*  < > 1.4* 2.2  --  1.8  --   --   --   --   --   PHOS 3.0 3.1  --  3.8  --   --   --   --   --   --   --   --   --   --   < > = values in this interval not displayed. Liver Function Tests:  Recent Labs  10/11/15 0545 10/12/15 0422 10/13/15 0550 12/05/15  AST 37 21 32 13*  ALT 20 23 22 11   ALKPHOS 139* 118 112 103  BILITOT 1.9* 1.3* 1.3*  --   PROT 5.6* 4.9* 4.9*  --   ALBUMIN 2.5* 2.3* 2.4*  --     Recent Labs  08/28/15 2313 09/06/15 0559  AMMONIA 28 13   CBC:  Recent Labs  08/28/15 2312  10/01/15 2145  11/27/15 2137 11/30/15 0215 12/01/15 0234 12/05/15  WBC 6.4  < > 1.6*  < > 6.4 6.1 7.0 9.2  NEUTROABS 3.8  --  0.1*  --  4.7  --   --   --   HGB 11.8*  < > 10.1*  < > 9.0* 8.3* 9.3* 11.4*  HCT 37.5*  < > 31.0*  < > 29.2* 27.1* 30.1* 37*  MCV 95.2  < > 90.6  < > 98.6 98.2 97.4  --   PLT 254  < > 218  < > 270 275 278 341  < > = values in this interval not displayed. Cardiac Enzymes:  Recent Labs  11/28/15 0105 11/28/15 0616 11/28/15 1242  TROPONINI 0.04* 0.04* 0.04*    Recent Labs  12/03/15 0644 12/03/15 1124 12/03/15 1634  GLUCAP 183* 247* 320*    Assessment/Plan:   CHF Status post hospital admission for exacerbation. Treated with IV lasix. Continue Lasix 20 mg Tablet daily. BMP in 1-2 weeks with PCP   CAD Troponin elevated during recent hospital admission thought possible due to demand ischemia. Chest pain free. Continue Metoprolol and Lipitor. Nitro PRN for chest pain.   DM CBG's ranging in the 130's-200's.Continue on Novolog Mix . He will discharge home with  [facility] RN for insulin and medication management. Monitor Hgb A1C  CKD  Continue to monitor BMP  COPD Stable. Continue on symbicort and  Albuterol PRN   Unsteady gait Has completed skilled PT/OT therapy. Will Discharge home with Home health PT/OT to continue with ROM, Exercise, Gait stability and muscle strengthening. He does not require any DME. Fall and safety precaution.   Generalized weakness  Has improved with PT/OT. Will Discharge home with Home health PT/OT to continue with ROM, Exercise, Gait stability and muscle strengthening.   Patient is being discharged with the following home health services:   -PT/OT to continue with ROM, Exercise, Gait stability and muscle strengthening. -[facility] RN for insulin and medication management  Patient is being discharged with the following durable medical equipment:   -He does not require any DME  Patient has been advised to f/u with their PCP in 1-2 weeks to bring them up to date on their rehab stay.  Social services at facility was responsible for arranging this appointment.  Pt was provided with a 30 day supply of prescriptions for medications and refills must be obtained from their PCP.  For controlled substances, a more limited supply may be provided adequate until PCP appointment only.  Future labs/tests needed:  CBC, BMP in 1-2 weeks with PCP

## 2015-12-20 ENCOUNTER — Other Ambulatory Visit: Payer: Self-pay | Admitting: *Deleted

## 2015-12-22 ENCOUNTER — Encounter: Payer: Self-pay | Admitting: *Deleted

## 2015-12-22 NOTE — Patient Outreach (Signed)
[facility]) Care Management  12/22/2015  Jacob [name] 12-15-1940 [id number]  CSW was able to meet with patient today at [facility] where patient currently resides to receive short-term rehabilitative services, to attend patient's Discharge Planning Meeting.  It was determined that patient would be discharged from the skilled facility on Saturday, October 8th, returning to [place] to live with his son, [name], [name].  [name] indicated that he is in the process of trying to find an assisted living facility in [place], suitable for patient and his wife, [name].  Both patient and Mrs. Schlarb are agreeable to this plan and are working toward consolidating their assets and breaking their lease at their current independent living apartment.  Home health physical therapy, nursing and aide services have all been arranged through [facility]. CSW will perform a case closure on patient, as all goals of treatment have been met from social work standpoint and no additional social work needs have been identified at this time.  CSW will fax an update to patient's Primary Care Physician at the [facility], to ensure that they are aware of CSW's involvement with patient's plan of care.  CSW will submit a case closure request to Verlon Setting, Care Management Assistant with [facility], in the form of an [employer].   Nat Christen, BSW, MSW, LCSW  Licensed Education officer, environmental Health System  Mailing [place] [address], [place], [place] [address] Physical Address-[address], [place], [place] [address] Toll Free Main # [phone] Fax # [phone] Cell # [phone]  Fax # [phone]  [email or web address]

## 2015-12-24 ENCOUNTER — Ambulatory Visit: Payer: Self-pay | Admitting: *Deleted

## 2015-12-24 ENCOUNTER — Other Ambulatory Visit: Payer: Self-pay

## 2015-12-24 DIAGNOSIS — I509 Heart failure, unspecified: Secondary | ICD-10-CM

## 2015-12-24 NOTE — Consult Note (Signed)
This Probation officer received a note from [name] regarding her husband, Jacob [name] DOB: April 01, 1940, HIPPA verified.  Wife states patient was discharged from [facility] back to [employer], independent living area. She states the patient discharged to their home on Saturday and she has been awaiting for Nat Christen, social worker to follow up with her for services with [facility].  Chart reviewed and looks apparently the thoughts were that the patient was going to [place] and looks like the case was closed. Hulen Skains the wife back and she states, "NO, No!  We discussed it that we would be leaving this area for another year to move near our son.  I need help now with the pharmacist, social worker and the nurse.  I thought that was clear."  Apologized for any misunderstandings.  Voice mail message left to [place] phone.  Will follow up with referral needs.   Natividad Brood, RN BSN [facility] Liaison  [phone] business mobile phone Toll free office [phone]

## 2015-12-26 ENCOUNTER — Other Ambulatory Visit: Payer: Self-pay | Admitting: *Deleted

## 2015-12-26 NOTE — Patient Outreach (Signed)
[facility]) Care Management  12/26/2015  Jacob [name] 05-11-1940 [id number]   RN spoke with pt who verified credential and provided verbal consent to speak with his spouse Jacob [name]. RN introduced the [facility] services and purpose for today's call. Pt indicated pt has [facility] for PT and nursing. States the agency his also assisting with organizing pt's medication with a pill planner. WIfe states pt no longer in needed of a pharmacy with [facility] (RN will alert CMA). RN inquired on pt's ongoing needs for case management services and was informed of the following:  COPD- Wife indicated this medical condition is controlled with inhalers and ongoing medication. RN reviewed and educated caregiver on the COPD action plan and verified pt remains in the GREEN zone with no reported issues. Caregiver denies any issues with pt's breathing and states pt is walking with PT [facility] and doing well.  DM- Wife reports this is also controlled with his medications and the AM reads over the last two days were 128 today and 97 on yesterday. RN education on SICK DAYS and how other medications may affect his CBG readings (caregiver with understanding). HF- Caregiver reported recent visit to the [place] with pt's provider mailing pt a scale for daily weights but it has not been received yet. RN educated caregiver on the importance of daily weights and generated the plan of care and goals around this medication condition based upon the pt's needs at this time.  After educating caregiver RN able to verify pt remains in the GREEN zone with no swelling, breathing or coughing issues.   Based upon pt's [facility] involved in addressing pt's ongoing medical issues RN has informed caregiver that [facility] would conflict with [facility]Health's involvement however available for community home visit if needed to continue to further education on pt's medical condition and address any needs with available resources in the  community. Caregiver currently receptive to telephone transition of care calls only  at this time. Caregiver requested [facility] involved social worker Jacob [name]) and received to scheduled the next transition of care call for next week. Will follow up at that time and continue to offer home visits if needed for community case management services.  Note RN attempted to review all pt's medications however wife did not wish to completed this at this time and requested on the next call to review the medication list. States the [facility] agency has everything lined up and organized and she needs to obtain the list of medications for review.  Raina Mina, RN Care Management Coordinator [place] Office [phone]

## 2015-12-30 ENCOUNTER — Other Ambulatory Visit: Payer: Self-pay | Admitting: *Deleted

## 2015-12-30 NOTE — Patient Outreach (Signed)
[place] [facility]) Care Management  12/30/2015  COSBY PROBY 10-Oct-1940 [id number]   RN spoke with pt's spouse (primary caregiver) and reintroduced the [facility] program and services. Wife requested RN call back indicating pt was not available to talk and RN offered to follow up the next day. Will completed the transition of care information at that time and attempt to complete the initial assessment if pt remains interested in the [facility] program and services.   Raina Mina, RN Care Management Coordinator [place] Office [phone]

## 2015-12-31 ENCOUNTER — Other Ambulatory Visit: Payer: Self-pay | Admitting: *Deleted

## 2015-12-31 ENCOUNTER — Ambulatory Visit: Payer: Self-pay | Admitting: *Deleted

## 2015-12-31 NOTE — Patient Outreach (Signed)
[facility]) Care Management  12/31/2015  DIXON LUCZAK 12/29/1940 [id number]   RN spoke briefly with pt who requested RN to call back and provided RN permission to speak with his spouse who would not be available to the later part of today. RN will attempt another outreach call today and inquire further.   Raina Mina, RN Care Management Coordinator [place] Office [phone]

## 2016-01-01 ENCOUNTER — Other Ambulatory Visit: Payer: Self-pay | Admitting: *Deleted

## 2016-01-01 NOTE — Patient Outreach (Signed)
[place] [facility]) Care Management  01/01/2016  NAYIB REMER [birth year] [id number]   RN spoke with pt's caregiver wife today Girard Cooter) who is consent on behalf of this pt. Caregiver states pt is doing well with the involved [place] however one pending issues with pt's medication (Eliquis). Currently pt has supplies but in need of a refill. At this time the medication is to expensive ($45 [facility]) but [facility] is working with pt's primary provided at the [place] for a possible remedy. RN offered to assist if need on this matter to avoid pt mission any future dosage (caregiver with understanding). Due to pt's recent HF admission RN inquired on daily weights and monitoring of pt's HF. Caregiver states they are still awaiting delivery of a home scale that was mailed out last week. RN encouraged caregiver to inquire with involved [place] on receiving a scale for daily weights due to his diagnosis of HF (caregiver with inquire). Caregiver is aware if there is a problem to contact his RN for further assistance with weigh scales.   RN reviewed the HF zones and verified pt currently remains in the GREEN zone with no signs or symptoms at this time. States pt is breathing well with no reported issues. RN offered a home visit if needed to further address pt's needs however at this time with all of the involved services caregiver more receptive to the ongoing transition of care calls and will inquired on any needs at that time. RN offered to follow up over the next few weeks with the next call next week. Caregiver very receptive and grateful for the assistance offered today. Will follow up next week and scheduled transition of care call.   Patient was recently discharged from hospital and all medications have been reviewed.  Raina Mina, RN Care Management Coordinator [place] Office [phone]

## 2016-01-02 ENCOUNTER — Other Ambulatory Visit: Payer: Self-pay | Admitting: *Deleted

## 2016-01-02 NOTE — Patient Outreach (Signed)
[place] [facility]) Care Management  01/02/2016  JAAN FISCHEL [birth year] [id number]    RN received a call from pt's spouse indicating pt has received additional medication (Eliquis) and has a sufficient amount for his ongoing administration a prescribed. No further need for [facility] pharmacy to get involved as this matter is resolved. Will follow up as scheduled on 10/23 for ongoing transition of care contact.  Raina Mina, RN Care Management Coordinator [place] Office [phone]

## 2016-01-03 ENCOUNTER — Ambulatory Visit (HOSPITAL_COMMUNITY): Payer: Self-pay | Admitting: Psychiatry

## 2016-01-07 ENCOUNTER — Other Ambulatory Visit: Payer: Self-pay | Admitting: *Deleted

## 2016-01-07 NOTE — Patient Outreach (Signed)
[place] [facility]) Care Management  01/07/2016  Jacob [name] Jul 22, 1940 [id number]   RN spoke with pt's consent caregiver spouse who reports pt is doing "great" with no major issues. Confirms pt has received his renewal for Eliquis and taking all his medications as directed. RN confirmed pt remain sin the GREEN zone related to his COPD and managing his diabetes very well with a CBG this morning at 103. Reports remain good via providers office visits with no acute issues or complaints. Based upon pt's limited office visits encouraged caregiver to check all needed refills on the next visit for medication supplies. Plan of care reviewed and discussed with goals in place for ongoing interventions and RN will re-evaluate over the net few weeks pt's progress and continue to increase pt's knowledge base related. No other inquires or needs at this time.  Raina Mina, RN Care Management Coordinator [place] Office [phone]

## 2016-01-09 ENCOUNTER — Ambulatory Visit: Payer: Self-pay | Admitting: Internal Medicine

## 2016-01-13 ENCOUNTER — Other Ambulatory Visit: Payer: Self-pay | Admitting: *Deleted

## 2016-01-13 NOTE — Patient Outreach (Signed)
[place] [facility]) Care Management  01/13/2016  AJMAL KATHAN September 11, 1940 [id number]   RN spoke with pt's spouse today the consent primary caregiver and reintroduced the [facility] services . Caregiver indicates pt is doing "well". Reports PT/OT/HHRN visiting today with ongoing [facility]health services. Reports pt has received the ordered scales and maintaining a weight over the last few days of 175 lbs.  Reorots pt is managing his diabetes with acute abnormal readings and breathing very good with use of his nebulizer in the morning and at night as recommended. RN continues to extend an invitation for a home visit however caregiver continues to remain receptive at this time to ongoing transition of care calls. RN offered to continue weekly follow up call concerning pt's recovery progress and offered any community resources that maybe needed (none at this time). Will schedule follow up call next week. Wife very grateful for the calls and inquires on her spouse.  Raina Mina, RN Care Management Coordinator [place] Office [phone]

## 2016-01-20 ENCOUNTER — Other Ambulatory Visit: Payer: Self-pay | Admitting: *Deleted

## 2016-01-20 NOTE — Patient Outreach (Signed)
[place] [facility]) Care Management  01/20/2016  Jacob [name] Dec 02, 1940 [id number]   RN spoke with primary caregiver spouse Jacob [name]) who indicates pt is doing well and currently working with [facility] PT services today. States pt is having issues with his balance and this has been the focus of his therapy but continues to improve. Caregiver reports pt's weights have been stable with this AM at 175 lbs and yesterday and last week at 176 lbs with no problems with swelling or symptoms related to HF. RN reviewed the plan of care and goal and verified pt has been attending all medical appointments, taking all prescription medications with enough refills and continues to monitor his HF with daily weights along with recognizing any acute symptoms and both the pt and wife are aware of what to do if acute issues should occur. RN offered to follow up once again next week as he last transition of care and inquired if pt would like to continue ongoing disease management for possible ongoing telephone call from a health coach via [facility] services. Declined at this time however RN will mention once again and discuss as allowed prior to pt's discharged via [facility] services next week.  Jacob Mina, RN Care Management Coordinator [place] Office [phone]

## 2016-01-23 ENCOUNTER — Other Ambulatory Visit: Payer: Self-pay | Admitting: *Deleted

## 2016-01-23 NOTE — Patient Outreach (Signed)
[facility]) Care Management  01/23/2016  Jacob [name] 21-Sep-1940 [id number]   RN spoke with pt's primary caregiver today and inquired on possibly obtaining additional information concerning pt's assessment. Caregiver feels this is not necessary and decline to provide other information requested based upon the initial assessment however continues to be receptive to ongoing transition of care call with one additional call that will take place next week prior to the end of services if pt does not need a health coach. Will further engage and provide this information next week.  Again pt's consented caregiver Jacob [name]) refused to provide additional assessment information concerning the initial assessment.   Jacob Mina, RN Care Management Coordinator [place] Office [phone]

## 2016-01-27 ENCOUNTER — Encounter: Payer: Self-pay | Admitting: *Deleted

## 2016-01-27 ENCOUNTER — Other Ambulatory Visit: Payer: Self-pay | Admitting: *Deleted

## 2016-01-27 NOTE — Patient Outreach (Signed)
[place] [facility]) Care Management  01/27/2016  YONAEL TULLOCH 1941/01/08 [id number]  RN spoke with representative via [place] who verified pt's primary care doctor remains active with the [facility] with a fax number verified at [phone] and the contact was [phone] transferred to the local clinic with the extension #[phone]. Will notify [facility] for EPIC update and send primary care provider case closure letter.   Raina Mina, RN Care Management Coordinator [place] Office [phone]

## 2016-01-27 NOTE — Patient Outreach (Signed)
[place] [facility]) Care Management  01/27/2016  RASHEEM FIGIEL [birth year] [id number]   RN spoke with the primary caregiver who verified pt continues to do well with [facility] (PT). States recent medication changes related to his Diabetes with his 70/30 insulin new dosage with AM dose 35 units and PM 25 units with the new readings around 152-155 on his AM readings.  Caregiver reports pt's continues to do well with managing his HF with the reported readings yesterday at 176 lbs and today 177 lbs. Caregiver reports pt is doing very well and taking all medications, attending all medical appointments with a better understanding of his medical condition. Plan of care discussed along with all goals which have been met as of today with no delays or reported issues.  Based upon the declined for active home visits for community involvement and no need for ongoing telephonic health coach, case will be closed. RN has informed caregiver that the pt's primary provider will be notified that pt will be discharged via [facility] services with all goals met. Caregiver very grateful for the calls.  Raina Mina, RN Care Management Coordinator [place] Office [phone]

## 2016-03-10 DIAGNOSIS — I2510 Atherosclerotic heart disease of native coronary artery without angina pectoris: Secondary | ICD-10-CM | POA: Diagnosis not present

## 2016-03-10 DIAGNOSIS — E784 Other hyperlipidemia: Secondary | ICD-10-CM | POA: Diagnosis not present

## 2016-03-10 DIAGNOSIS — E668 Other obesity: Secondary | ICD-10-CM | POA: Diagnosis not present

## 2016-03-10 DIAGNOSIS — I480 Paroxysmal atrial fibrillation: Secondary | ICD-10-CM | POA: Diagnosis not present

## 2016-03-10 DIAGNOSIS — Z7901 Long term (current) use of anticoagulants: Secondary | ICD-10-CM | POA: Diagnosis not present

## 2016-03-10 DIAGNOSIS — R0602 Shortness of breath: Secondary | ICD-10-CM | POA: Diagnosis not present

## 2016-03-10 DIAGNOSIS — I119 Hypertensive heart disease without heart failure: Secondary | ICD-10-CM | POA: Diagnosis not present

## 2016-03-10 DIAGNOSIS — E1142 Type 2 diabetes mellitus with diabetic polyneuropathy: Secondary | ICD-10-CM | POA: Diagnosis not present

## 2016-03-10 DIAGNOSIS — I5022 Chronic systolic (congestive) heart failure: Secondary | ICD-10-CM | POA: Diagnosis not present

## 2016-04-10 DIAGNOSIS — R0602 Shortness of breath: Secondary | ICD-10-CM | POA: Diagnosis not present

## 2016-04-10 DIAGNOSIS — E784 Other hyperlipidemia: Secondary | ICD-10-CM | POA: Diagnosis not present

## 2016-04-10 DIAGNOSIS — I119 Hypertensive heart disease without heart failure: Secondary | ICD-10-CM | POA: Diagnosis not present

## 2016-04-10 DIAGNOSIS — I5022 Chronic systolic (congestive) heart failure: Secondary | ICD-10-CM | POA: Diagnosis not present

## 2016-04-10 DIAGNOSIS — E668 Other obesity: Secondary | ICD-10-CM | POA: Diagnosis not present

## 2016-04-10 DIAGNOSIS — I480 Paroxysmal atrial fibrillation: Secondary | ICD-10-CM | POA: Diagnosis not present

## 2016-04-10 DIAGNOSIS — E1142 Type 2 diabetes mellitus with diabetic polyneuropathy: Secondary | ICD-10-CM | POA: Diagnosis not present

## 2016-04-10 DIAGNOSIS — I2510 Atherosclerotic heart disease of native coronary artery without angina pectoris: Secondary | ICD-10-CM | POA: Diagnosis not present

## 2016-04-10 DIAGNOSIS — Z7901 Long term (current) use of anticoagulants: Secondary | ICD-10-CM | POA: Diagnosis not present

## 2016-06-29 DIAGNOSIS — E1142 Type 2 diabetes mellitus with diabetic polyneuropathy: Secondary | ICD-10-CM | POA: Diagnosis not present

## 2016-06-29 DIAGNOSIS — I5022 Chronic systolic (congestive) heart failure: Secondary | ICD-10-CM | POA: Diagnosis not present

## 2016-06-29 DIAGNOSIS — I480 Paroxysmal atrial fibrillation: Secondary | ICD-10-CM | POA: Diagnosis not present

## 2016-06-29 DIAGNOSIS — I2510 Atherosclerotic heart disease of native coronary artery without angina pectoris: Secondary | ICD-10-CM | POA: Diagnosis not present

## 2016-06-29 DIAGNOSIS — I119 Hypertensive heart disease without heart failure: Secondary | ICD-10-CM | POA: Diagnosis not present

## 2016-06-29 DIAGNOSIS — E784 Other hyperlipidemia: Secondary | ICD-10-CM | POA: Diagnosis not present

## 2016-06-29 DIAGNOSIS — R0602 Shortness of breath: Secondary | ICD-10-CM | POA: Diagnosis not present

## 2016-06-29 DIAGNOSIS — E668 Other obesity: Secondary | ICD-10-CM | POA: Diagnosis not present

## 2016-06-29 DIAGNOSIS — Z7901 Long term (current) use of anticoagulants: Secondary | ICD-10-CM | POA: Diagnosis not present

## 2016-09-28 DIAGNOSIS — R0602 Shortness of breath: Secondary | ICD-10-CM | POA: Diagnosis not present

## 2016-09-28 DIAGNOSIS — E668 Other obesity: Secondary | ICD-10-CM | POA: Diagnosis not present

## 2016-09-28 DIAGNOSIS — I5022 Chronic systolic (congestive) heart failure: Secondary | ICD-10-CM | POA: Diagnosis not present

## 2016-09-28 DIAGNOSIS — Z7901 Long term (current) use of anticoagulants: Secondary | ICD-10-CM | POA: Diagnosis not present

## 2016-09-28 DIAGNOSIS — I119 Hypertensive heart disease without heart failure: Secondary | ICD-10-CM | POA: Diagnosis not present

## 2016-09-28 DIAGNOSIS — E784 Other hyperlipidemia: Secondary | ICD-10-CM | POA: Diagnosis not present

## 2016-09-28 DIAGNOSIS — I2510 Atherosclerotic heart disease of native coronary artery without angina pectoris: Secondary | ICD-10-CM | POA: Diagnosis not present

## 2016-09-28 DIAGNOSIS — I480 Paroxysmal atrial fibrillation: Secondary | ICD-10-CM | POA: Diagnosis not present

## 2016-09-28 DIAGNOSIS — E1142 Type 2 diabetes mellitus with diabetic polyneuropathy: Secondary | ICD-10-CM | POA: Diagnosis not present

## 2017-01-04 DIAGNOSIS — I480 Paroxysmal atrial fibrillation: Secondary | ICD-10-CM | POA: Diagnosis not present

## 2017-01-04 DIAGNOSIS — E1142 Type 2 diabetes mellitus with diabetic polyneuropathy: Secondary | ICD-10-CM | POA: Diagnosis not present

## 2017-01-04 DIAGNOSIS — R0602 Shortness of breath: Secondary | ICD-10-CM | POA: Diagnosis not present

## 2017-01-04 DIAGNOSIS — I5022 Chronic systolic (congestive) heart failure: Secondary | ICD-10-CM | POA: Diagnosis not present

## 2017-01-04 DIAGNOSIS — E7849 Other hyperlipidemia: Secondary | ICD-10-CM | POA: Diagnosis not present

## 2017-01-04 DIAGNOSIS — I2510 Atherosclerotic heart disease of native coronary artery without angina pectoris: Secondary | ICD-10-CM | POA: Diagnosis not present

## 2017-01-04 DIAGNOSIS — Z7901 Long term (current) use of anticoagulants: Secondary | ICD-10-CM | POA: Diagnosis not present

## 2017-01-04 DIAGNOSIS — E668 Other obesity: Secondary | ICD-10-CM | POA: Diagnosis not present

## 2017-01-04 DIAGNOSIS — I119 Hypertensive heart disease without heart failure: Secondary | ICD-10-CM | POA: Diagnosis not present

## 2017-03-25 DIAGNOSIS — E1142 Type 2 diabetes mellitus with diabetic polyneuropathy: Secondary | ICD-10-CM | POA: Diagnosis not present

## 2017-03-25 DIAGNOSIS — I119 Hypertensive heart disease without heart failure: Secondary | ICD-10-CM | POA: Diagnosis not present

## 2017-03-25 DIAGNOSIS — R0602 Shortness of breath: Secondary | ICD-10-CM | POA: Diagnosis not present

## 2017-03-25 DIAGNOSIS — Z7901 Long term (current) use of anticoagulants: Secondary | ICD-10-CM | POA: Diagnosis not present

## 2017-03-25 DIAGNOSIS — I5022 Chronic systolic (congestive) heart failure: Secondary | ICD-10-CM | POA: Diagnosis not present

## 2017-03-25 DIAGNOSIS — I480 Paroxysmal atrial fibrillation: Secondary | ICD-10-CM | POA: Diagnosis not present

## 2017-03-25 DIAGNOSIS — E668 Other obesity: Secondary | ICD-10-CM | POA: Diagnosis not present

## 2017-03-25 DIAGNOSIS — I2510 Atherosclerotic heart disease of native coronary artery without angina pectoris: Secondary | ICD-10-CM | POA: Diagnosis not present

## 2017-04-17 DIAGNOSIS — Z794 Long term (current) use of insulin: Secondary | ICD-10-CM | POA: Diagnosis not present

## 2017-04-17 DIAGNOSIS — E119 Type 2 diabetes mellitus without complications: Secondary | ICD-10-CM | POA: Diagnosis not present

## 2017-04-17 DIAGNOSIS — I2510 Atherosclerotic heart disease of native coronary artery without angina pectoris: Secondary | ICD-10-CM | POA: Diagnosis not present

## 2017-04-17 DIAGNOSIS — Z79891 Long term (current) use of opiate analgesic: Secondary | ICD-10-CM | POA: Diagnosis not present

## 2017-04-17 DIAGNOSIS — Z87891 Personal history of nicotine dependence: Secondary | ICD-10-CM | POA: Diagnosis not present

## 2017-04-17 DIAGNOSIS — Z7982 Long term (current) use of aspirin: Secondary | ICD-10-CM | POA: Diagnosis not present

## 2017-04-17 DIAGNOSIS — Z79899 Other long term (current) drug therapy: Secondary | ICD-10-CM | POA: Diagnosis not present

## 2017-04-17 DIAGNOSIS — R040 Epistaxis: Secondary | ICD-10-CM | POA: Diagnosis not present

## 2017-04-17 DIAGNOSIS — J449 Chronic obstructive pulmonary disease, unspecified: Secondary | ICD-10-CM | POA: Diagnosis not present

## 2017-04-17 DIAGNOSIS — Z951 Presence of aortocoronary bypass graft: Secondary | ICD-10-CM | POA: Diagnosis not present

## 2017-04-17 DIAGNOSIS — Z7901 Long term (current) use of anticoagulants: Secondary | ICD-10-CM | POA: Diagnosis not present

## 2017-04-17 DIAGNOSIS — C649 Malignant neoplasm of unspecified kidney, except renal pelvis: Secondary | ICD-10-CM | POA: Diagnosis not present

## 2017-04-17 DIAGNOSIS — I10 Essential (primary) hypertension: Secondary | ICD-10-CM | POA: Diagnosis not present

## 2017-04-18 DIAGNOSIS — Z7901 Long term (current) use of anticoagulants: Secondary | ICD-10-CM | POA: Diagnosis not present

## 2017-04-18 DIAGNOSIS — Z7951 Long term (current) use of inhaled steroids: Secondary | ICD-10-CM | POA: Diagnosis not present

## 2017-04-18 DIAGNOSIS — R040 Epistaxis: Secondary | ICD-10-CM | POA: Diagnosis not present

## 2017-04-18 DIAGNOSIS — R0609 Other forms of dyspnea: Secondary | ICD-10-CM | POA: Diagnosis not present

## 2017-04-18 DIAGNOSIS — Z794 Long term (current) use of insulin: Secondary | ICD-10-CM | POA: Diagnosis not present

## 2017-04-18 DIAGNOSIS — I2510 Atherosclerotic heart disease of native coronary artery without angina pectoris: Secondary | ICD-10-CM | POA: Diagnosis not present

## 2017-04-18 DIAGNOSIS — R0600 Dyspnea, unspecified: Secondary | ICD-10-CM | POA: Diagnosis not present

## 2017-04-18 DIAGNOSIS — C649 Malignant neoplasm of unspecified kidney, except renal pelvis: Secondary | ICD-10-CM | POA: Diagnosis not present

## 2017-04-18 DIAGNOSIS — Z87891 Personal history of nicotine dependence: Secondary | ICD-10-CM | POA: Diagnosis not present

## 2017-04-18 DIAGNOSIS — Z79899 Other long term (current) drug therapy: Secondary | ICD-10-CM | POA: Diagnosis not present

## 2017-04-18 DIAGNOSIS — Z85528 Personal history of other malignant neoplasm of kidney: Secondary | ICD-10-CM | POA: Diagnosis not present

## 2017-04-18 DIAGNOSIS — E119 Type 2 diabetes mellitus without complications: Secondary | ICD-10-CM | POA: Diagnosis not present

## 2017-04-18 DIAGNOSIS — Z951 Presence of aortocoronary bypass graft: Secondary | ICD-10-CM | POA: Diagnosis not present

## 2017-04-18 DIAGNOSIS — J449 Chronic obstructive pulmonary disease, unspecified: Secondary | ICD-10-CM | POA: Diagnosis not present

## 2017-10-18 DIAGNOSIS — E668 Other obesity: Secondary | ICD-10-CM | POA: Diagnosis not present

## 2017-10-18 DIAGNOSIS — R0602 Shortness of breath: Secondary | ICD-10-CM | POA: Diagnosis not present

## 2017-10-18 DIAGNOSIS — E1142 Type 2 diabetes mellitus with diabetic polyneuropathy: Secondary | ICD-10-CM | POA: Diagnosis not present

## 2017-10-18 DIAGNOSIS — E785 Hyperlipidemia, unspecified: Secondary | ICD-10-CM | POA: Diagnosis not present

## 2017-10-18 DIAGNOSIS — I5022 Chronic systolic (congestive) heart failure: Secondary | ICD-10-CM | POA: Diagnosis not present

## 2017-10-18 DIAGNOSIS — I480 Paroxysmal atrial fibrillation: Secondary | ICD-10-CM | POA: Diagnosis not present

## 2017-10-18 DIAGNOSIS — I119 Hypertensive heart disease without heart failure: Secondary | ICD-10-CM | POA: Diagnosis not present

## 2017-10-18 DIAGNOSIS — I2510 Atherosclerotic heart disease of native coronary artery without angina pectoris: Secondary | ICD-10-CM | POA: Diagnosis not present

## 2017-10-18 DIAGNOSIS — Z7901 Long term (current) use of anticoagulants: Secondary | ICD-10-CM | POA: Diagnosis not present

## 2017-12-28 ENCOUNTER — Other Ambulatory Visit: Payer: Self-pay

## 2017-12-28 ENCOUNTER — Ambulatory Visit: Payer: No Typology Code available for payment source | Attending: Internal Medicine | Admitting: Physical Therapy

## 2017-12-28 ENCOUNTER — Encounter: Payer: Self-pay | Admitting: Physical Therapy

## 2017-12-28 DIAGNOSIS — M545 Low back pain, unspecified: Secondary | ICD-10-CM

## 2017-12-28 DIAGNOSIS — R2689 Other abnormalities of gait and mobility: Secondary | ICD-10-CM | POA: Diagnosis present

## 2017-12-28 DIAGNOSIS — G8929 Other chronic pain: Secondary | ICD-10-CM | POA: Insufficient documentation

## 2017-12-28 DIAGNOSIS — R2681 Unsteadiness on feet: Secondary | ICD-10-CM | POA: Insufficient documentation

## 2017-12-28 DIAGNOSIS — M6281 Muscle weakness (generalized): Secondary | ICD-10-CM | POA: Insufficient documentation

## 2017-12-28 NOTE — Therapy (Signed)
[facility] [address], [place], [place], [address] Phone: [phone]   Fax:  [phone]  Physical Therapy Evaluation  Patient Details  Name: Jacob [name] MRN: [id number] Date of Birth: 17-May-1940 Referring Provider (PT): Annie Sable, MD   Encounter Date: 12/28/2017  PT End of Session - 12/28/17 1525    Visit Number  1    Date for PT Re-Evaluation  02/28/18    Authorization Type  [place]    Authorization Time Period  12/28/17 to 03/22/17 (per VA approval    PT Start Time  1445    PT Stop Time  1530    PT Time Calculation (min)  45 min    Activity Tolerance  No increased pain;Patient tolerated treatment well    Behavior During Therapy  [facility] for tasks assessed/performed       Past Medical History:  Diagnosis Date  . Allergy   . Anemia   . Anxiety   . Arthritis   . Asthma   . BPH (benign prostatic hyperplasia)   . Cancer of kidney ([place])   . Cataract   . CHF (congestive heart failure) ([place])   . Chronic kidney disease    chronic  kidney failure  kidney function at 42%  . COPD (chronic obstructive pulmonary disease) ([place])   . Coronary artery disease    CABG  7 bypasses  . Diabetes mellitus without complication ([place])   . Hepatitis    many years ago  . Hypertension   . Polymyalgia rheumatica (HCC)    maintained on Prednisone, Plaquenil. Followed by rhuematology every 4 months/[name].  . Shortness of breath dyspnea    with exertion  . Sleep apnea    CPAP   Trying to use    Past Surgical History:  Procedure Laterality Date  . APPENDECTOMY    . CATARACT EXTRACTION W/PHACO Right 11/28/2014   Procedure: CATARACT EXTRACTION PHACO AND INTRAOCULAR LENS PLACEMENT ([place]) RIGHT ;  Surgeon: Marylynn Pearson, MD;  Location: [place];  Service: Ophthalmology;  Laterality: Right;  . CORONARY ARTERY BYPASS GRAFT  03/17/1995   Wynonia Lawman; followed every six months.  Marland Kitchen HERNIA REPAIR    . LUMBAR LAMINECTOMY/DECOMPRESSION  MICRODISCECTOMY N/A 07/30/2015   Procedure: LUMBAR LAMINECTOMY DISCECTOMY ;  Surgeon: Ashok Pall, MD;  Location: [place] NEURO ORS;  Service: Neurosurgery;  Laterality: N/A;  LUMBAR LAMINECTOMY DISCECTOMY   . LUMBAR LAMINECTOMY/DECOMPRESSION MICRODISCECTOMY N/A 09/06/2015   Procedure: Redo L3/4 Disectomy;  Surgeon: Ashok Pall, MD;  Location: [place] NEURO ORS;  Service: Neurosurgery;  Laterality: N/A;  . PROSTATE SURGERY     TURP at [place].    There were no vitals filed for this visit.   Subjective Assessment - 12/28/17 1454    Subjective  Pt reports that he had back surgery back in 2017 which resulted in going back to surgery for a complication. He went to several rehabs and only made so much progress before he had to be discharged home. He still has back pain and he feels unsteady with his gait. He has his wife to help him around the house.     Pertinent History  CABG, lumbar lamiectomy and microdiscectomy, DM with neuropathy    Limitations  House hold activities    Currently in Pain?  Yes    Pain Score  --   unsure    Pain Location  Back    Pain Orientation  Right;Lower    Pain Descriptors / Indicators  Aching;Throbbing  Pain Type  Chronic pain    Pain Radiating Towards  down the Rt outer leg and to the shin    Pain Onset  More than a month ago    Pain Frequency  Constant    Aggravating Factors   being up and moving around alot     Pain Relieving Factors  sitting helps ease the pain     Effect of Pain on Daily Activities  limited activity tolerance          [name] PT Assessment - 12/28/17 0001      Assessment   Medical Diagnosis  other abnormilites    Referring Provider (PT)  Annie Sable, MD    Onset Date/Surgical Date  --   2017   Next MD Visit  unsure     Prior Therapy  LBP 2+ years ago       Precautions   Precautions  None      Restrictions   Weight Bearing Restrictions  No      Balance Screen   Has the patient fallen in the past 6 months  Yes    How many times?  1     Has the patient had a decrease in activity level because of a fear of falling?   Yes    Is the patient reluctant to leave their home because of a fear of falling?   No      Home Film/video editor residence      Prior Function   Level of Independence  Independent with basic ADLs    Vocation Requirements  retired       Designer, fashion/clothing Comments  N/T in BLE stocking glove distribution       Posture/Postural Control   Posture Comments  slouched, forward head and rounded shoulders       ROM / Strength   AROM / PROM / Strength  Strength      Strength   Strength Assessment Site  Hip;Knee;Ankle    Right/Left Hip  Right;Left    Right Hip Flexion  3/5    Right Hip Extension  2/5    Right Hip ABduction  3/5    Left Hip Flexion  3/5    Left Hip Extension  2/5    Left Hip ABduction  3/5    Right/Left Knee  Right;Left    Right Knee Extension  4/5    Left Knee Extension  5/5      Flexibility   Soft Tissue Assessment /Muscle Length  yes    Quadriceps  (+) ely's Lt and Rt       Palpation   Palpation comment  tenderness along Rt greater trochanter,       Special Tests   Other special tests  (+) slump on Rt       Bed Mobility   Bed Mobility  --   ModI all bed mobility and transitions      Transfers   Five time sit to stand comments   17 sec, UE support      Standardized Balance Assessment   Standardized Balance Assessment  Berg Balance Test;Timed Up and Go Test      Berg Balance Test   Sit to Stand  Able to stand  independently using hands    Standing Unsupported  Able to stand 2 minutes with supervision    Sitting with Back Unsupported but Feet Supported on Floor or Stool  Able to sit  safely and securely 2 minutes    Stand to Sit  Controls descent by using hands    Transfers  Able to transfer safely, definite need of hands    Standing Unsupported with Eyes Closed  Able to stand 10 seconds with supervision    Standing Ubsupported with Feet  Together  Needs help to attain position but able to stand for 30 seconds with feet together    From Standing, Reach Forward with Outstretched Arm  Can reach forward >5 cm safely (2")    From Standing Position, Pick up Object from Floor  Unable to pick up shoe, but reaches 2-5 cm (1-2") from shoe and balances independently    From Standing Position, Turn to Look Behind Over each Shoulder  Needs assist to keep from losing balance and falling   looking to Rt   Turn 360 Degrees  Able to turn 360 degrees safely but slowly    Standing Unsupported, Alternately Place Feet on Step/Stool  Needs assistance to keep from falling or unable to try    Standing Unsupported, One Foot in Front  Able to take small step independently and hold 30 seconds    Standing on One Leg  Tries to lift leg/unable to hold 3 seconds but remains standing independently    Total Score  29                Objective measurements completed on examination: See above findings.              PT Education - 12/28/17 1657    Education Details  eval findings/POC; encouraged pt to complete HEP from prior PT until updates can be made     Person(s) Educated  Patient    Methods  Explanation;Verbal cues    Comprehension  Verbalized understanding;Returned demonstration       PT Short Term Goals - 12/28/17 1536      PT SHORT TERM GOAL #1   Title  Pt will be independent with his initial HEP to improve flexibility, strength and balance.     Time  4    Period  Weeks    Status  New    Target Date  01/28/18      PT SHORT TERM GOAL #2   Title  Pt will report walking for atleast 5 min, 3x/week to improve his endurance activity participation.     Time  4    Period  Weeks    Status  New        PT Long Term Goals - 12/28/17 1542      PT LONG TERM GOAL #1   Title  Pt will demo independence with his advanced HEP to futher promote strenght gains after d/c from PT.     Time  8    Period  Weeks    Status  New    Target  Date  02/28/18      PT LONG TERM GOAL #2   Title  Pt will demo improved functional strength and power of the LEs evident by his ability to complete 5x/sit to stand in less than 14 sec with minimal UE use.     Time  8    Period  Weeks    Status  New      PT LONG TERM GOAL #3   Title  Pt will have increase in BLE strength to atleast 4/5 MMT which will improve his ability to maintain upright standing posture throughout the day.  Time  8    Period  Weeks    Status  New      PT LONG TERM GOAL #4   Title  Pt will have atleast 6 points increase in his BERG balance score which will indicate a significant improvement in his balance and decrease fall risk.     Time  8    Period  Weeks    Status  New             Plan - 12/28/17 1623    Clinical Impression Statement  Pt is a pleasant 77 y.o M referred to OPPT with unsteadiness and LE weakness worsening since his back surgery back in 2017. He reports only 1 fall over the past year or so, and typically yses his [facility] throughout the day. He has significant weakness of the LEs (worse on the Rt), as well as limited LE flexibility, poor spine mobility and muscle spasm/tenderness throughout the glutes and paraspinals. His forward flexed posture and preference for sitting indicates a possible directional preference for flexion and this can be further monitored throughout his sessions. He scored 29 out of 56 on the BERG balance test, placing his at a high risk of falling. He would greatly benefit from skilled PT to address his limitations in LE strength, flexibility and improve his mobility and balance to decrease his risk of falling and causing injury to himself.     History and Personal Factors relevant to plan of care:  chronic LBP and gait unsteadiness following Lx surgery in 2017    Clinical Presentation  Evolving    Clinical Presentation due to:  worsening over time     Clinical Decision Making  Moderate    Rehab Potential  Good    PT Frequency   2x / week    PT Duration  8 weeks    PT Treatment/Interventions  ADLs/Self Care Home Management;Moist Heat;Traction;Electrical Stimulation;Cryotherapy;Functional mobility training;Therapeutic activities;Therapeutic exercise;Patient/family education;Manual techniques;Passive range of motion;Neuromuscular re-education;Balance training;Taping;Spinal Manipulations;Dry needling    PT Next Visit Plan  implement LE flexibility (hip flexor) and hip strengthening program; gentle trunk strengthening; static balance activity     PT Home Exercise Plan  next session, pt currently completing his prior program     Consulted and Agree with Plan of Care  Patient       Patient will benefit from skilled therapeutic intervention in order to improve the following deficits and impairments:  Abnormal gait, Decreased activity tolerance, Decreased safety awareness, Decreased strength, Impaired flexibility, Postural dysfunction, Pain, Obesity, Improper body mechanics, Decreased range of motion, Decreased endurance, Decreased balance, Decreased mobility, Difficulty walking, Hypomobility, Increased muscle spasms  Visit Diagnosis: Muscle weakness (generalized) - Plan: PT plan of care cert/re-cert  Unsteadiness on feet - Plan: PT plan of care cert/re-cert  Other abnormalities of gait and mobility - Plan: PT plan of care cert/re-cert  Chronic low back pain, unspecified back pain laterality, unspecified whether sciatica present - Plan: PT plan of care cert/re-cert     Problem List Patient Active Problem List   Diagnosis Date Noted  . Chronic kidney disease (CKD), stage IV (severe) ([place]) 11/27/2015  . Acute on chronic diastolic CHF (congestive heart failure) ([place]) 11/27/2015  . Normocytic anemia 11/27/2015  . AKI (acute kidney injury) ([place])   . Acute kidney injury superimposed on chronic kidney disease ([place]) 10/02/2015  . Atrial fibrillation with RVR ([place]) 10/02/2015  . Hypotension 10/02/2015  . Anorexia   .  Adjustment disorder with mixed emotional features 09/01/2015  .  Acute encephalopathy 08/29/2015  . Elevated troponin 08/29/2015  . Renal cancer ([place]) 08/28/2015  . Spinal stenosis of lumbar region with radiculopathy 08/07/2015  . COPD (chronic obstructive pulmonary disease) ([place])   . OSA (obstructive sleep apnea) 07/27/2015  . HNP (herniated nucleus pulposus), lumbar 07/24/2015  . CHF with left ventricular diastolic dysfunction, NYHA class 2 ([place]) 01/28/2014  . PMR (polymyalgia rheumatica) ([place]) 09/09/2013  . Rheumatoid arthritis ([place]) 04/13/2012  . CAD (coronary artery disease) 12/19/2011  . Insulin dependent diabetes mellitus ([place]) 12/19/2011  . Neuropathy ([place]) 12/19/2011     4:58 PM,12/28/17 Sherol Dade PT, DPT [place] [facility] [address], [place], [place], [address] Phone: [phone]   Fax:  [phone]  Name: Jacob [name] MRN: [id number] Date of Birth: 02/05/1941

## 2018-01-10 ENCOUNTER — Ambulatory Visit: Payer: No Typology Code available for payment source | Admitting: Physical Therapy

## 2018-01-10 ENCOUNTER — Other Ambulatory Visit: Payer: Self-pay | Admitting: Cardiology

## 2018-01-10 DIAGNOSIS — M545 Low back pain, unspecified: Secondary | ICD-10-CM

## 2018-01-10 DIAGNOSIS — M6281 Muscle weakness (generalized): Secondary | ICD-10-CM | POA: Diagnosis not present

## 2018-01-10 DIAGNOSIS — R2689 Other abnormalities of gait and mobility: Secondary | ICD-10-CM

## 2018-01-10 DIAGNOSIS — G8929 Other chronic pain: Secondary | ICD-10-CM

## 2018-01-10 DIAGNOSIS — R2681 Unsteadiness on feet: Secondary | ICD-10-CM

## 2018-01-10 MED ORDER — AMIODARONE HCL 200 MG PO TABS: 100.0000 mg | ORAL_TABLET | Freq: Every day | ORAL | 2 refills | Status: DC

## 2018-01-10 NOTE — Therapy (Signed)
[facility] [address] [place], [place], [address] Phone: [phone]   Fax:  [phone]  Physical Therapy Treatment  Patient Details  Name: Jacob [name] MRN: [id number] Date of Birth: [birth year] Referring Provider (PT): Annie Sable, MD   Encounter Date: 01/10/2018  PT End of Session - 01/10/18 1359    Visit Number  2    Date for PT Re-Evaluation  02/28/18    Authorization Type  VA    Authorization Time Period  12/28/17 to 03/22/17 (per VA approval    PT Start Time  1359    PT Stop Time  1500    PT Time Calculation (min)  61 min    Activity Tolerance  Patient tolerated treatment well    Behavior During Therapy  [facility] for tasks assessed/performed       Past Medical History:  Diagnosis Date  . Allergy   . Anemia   . Anxiety   . Arthritis   . Asthma   . BPH (benign prostatic hyperplasia)   . Cancer of kidney ([place])   . Cataract   . CHF (congestive heart failure) ([place])   . Chronic kidney disease    chronic  kidney failure  kidney function at 42%  . COPD (chronic obstructive pulmonary disease) ([place])   . Coronary artery disease    CABG  7 bypasses  . Diabetes mellitus without complication ([place])   . Hepatitis    many years ago  . Hypertension   . Polymyalgia rheumatica (HCC)    maintained on Prednisone, Plaquenil. Followed by rhuematology every 4 months/[name].  . Shortness of breath dyspnea    with exertion  . Sleep apnea    CPAP   Trying to use    Past Surgical History:  Procedure Laterality Date  . APPENDECTOMY    . CATARACT EXTRACTION W/PHACO Right 11/28/2014   Procedure: CATARACT EXTRACTION PHACO AND INTRAOCULAR LENS PLACEMENT ([place]) RIGHT ;  Surgeon: Marylynn Pearson, MD;  Location: [place];  Service: Ophthalmology;  Laterality: Right;  . CORONARY ARTERY BYPASS GRAFT  03/17/1995   Wynonia Lawman; followed every six months.  Marland Kitchen HERNIA REPAIR    . LUMBAR LAMINECTOMY/DECOMPRESSION MICRODISCECTOMY N/A 07/30/2015    Procedure: LUMBAR LAMINECTOMY DISCECTOMY ;  Surgeon: Ashok Pall, MD;  Location: [place] [facility];  Service: Neurosurgery;  Laterality: N/A;  LUMBAR LAMINECTOMY DISCECTOMY   . LUMBAR LAMINECTOMY/DECOMPRESSION MICRODISCECTOMY N/A 09/06/2015   Procedure: Redo L3/4 Disectomy;  Surgeon: Ashok Pall, MD;  Location: [place] [facility];  Service: Neurosurgery;  Laterality: N/A;  . PROSTATE SURGERY     TURP at [place].    There were no vitals filed for this visit.  Subjective Assessment - 01/10/18 1402    Subjective  Patient reports his back is hurting and getting worse all the time.    Pertinent History  CABG, lumbar lamiectomy and microdiscectomy, DM with neuropathy    Patient Stated Goals  to decrease pain    Currently in Pain?  Yes    Pain Score  8     Pain Location  Back    Pain Orientation  Right    Pain Descriptors / Indicators  Aching;Throbbing    Pain Type  Chronic pain    Pain Onset  More than a month ago                       [facility] Adult PT Treatment/Exercise - 01/10/18 0001      Ambulation/Gait  Gait Comments  amb 1x6 ft with cane in LUE; then 1x 20 feet level ground      Exercises   Exercises  Knee/Hip      Knee/Hip Exercises: Stretches   Active Hamstring Stretch  Both;1 rep;60 seconds    Hip Flexor Stretch  Both;1 rep;60 seconds;Limitations    Hip Flexor Stretch Limitations  used 8inch step to support foot of EOB; also did standing HF stretch in semitandem stance 1x30 sec each    Piriformis Stretch  Both;1 rep;60 seconds   seated figure 4   Other Knee/Hip Stretches  HF stretch standing at chair: backbend with abs engaged and gluteals tight x 10       Knee/Hip Exercises: Aerobic   Nustep  L3 x 5 min      Knee/Hip Exercises: Seated   Sit to Sand  2 sets;5 reps;without UE support   on mat+foam; second set with cane lift OH bil     Manual Therapy   Manual Therapy  Soft tissue mobilization    Soft tissue mobilization  to bil quads during HF stretch                PT Short Term Goals - 12/28/17 1536      PT SHORT TERM GOAL #1   Title  Pt will be independent with his initial HEP to improve flexibility, strength and balance.     Time  4    Period  Weeks    Status  New    Target Date  01/28/18      PT SHORT TERM GOAL #2   Title  Pt will report walking for atleast 5 min, 3x/week to improve his endurance activity participation.     Time  4    Period  Weeks    Status  New        PT Long Term Goals - 12/28/17 1542      PT LONG TERM GOAL #1   Title  Pt will demo independence with his advanced HEP to futher promote strenght gains after d/c from PT.     Time  8    Period  Weeks    Status  New    Target Date  02/28/18      PT LONG TERM GOAL #2   Title  Pt will demo improved functional strength and power of the LEs evident by his ability to complete 5x/sit to stand in less than 14 sec with minimal UE use.     Time  8    Period  Weeks    Status  New      PT LONG TERM GOAL #3   Title  Pt will have increase in BLE strength to atleast 4/5 MMT which will improve his ability to maintain upright standing posture throughout the day.     Time  8    Period  Weeks    Status  New      PT LONG TERM GOAL #4   Title  Pt will have atleast 6 points increase in his BERG balance score which will indicate a significant improvement in his balance and decrease fall risk.     Time  8    Period  Weeks    Status  New            Plan - 01/10/18 1448    Clinical Impression Statement  Pt tolerated treatment fairly well today. We focused on his tight hip flexors attempting varying methods for  stretching. The best for HEP is standing lunge at chair or counter. He tolerated all stretches much better with Transvese Abdominus contraction but needed repeated VCs for this. We also switched his cane to his LUE since he feels weaker and more pain on the RLE. He managed this well.  He had increased pain in back at end of treatment so estim with heat  was applied.    PT Treatment/Interventions  ADLs/Self Care Home Management;Moist Heat;Traction;Electrical Stimulation;Cryotherapy;Functional mobility training;Therapeutic activities;Therapeutic exercise;Patient/family education;Manual techniques;Passive range of motion;Neuromuscular re-education;Balance training;Taping;Spinal Manipulations;Dry needling    PT Next Visit Plan  Please print HEP for pt (see below code); implement LE flexibility (hip flexor) and hip strengthening program; gentle trunk strengthening; static balance activity     PT Home Exercise Plan   Access Code: [email or web address]        Patient will benefit from skilled therapeutic intervention in order to improve the following deficits and impairments:  Abnormal gait, Decreased activity tolerance, Decreased safety awareness, Decreased strength, Impaired flexibility, Postural dysfunction, Pain, Obesity, Improper body mechanics, Decreased range of motion, Decreased endurance, Decreased balance, Decreased mobility, Difficulty walking, Hypomobility, Increased muscle spasms  Visit Diagnosis: Muscle weakness (generalized)  Unsteadiness on feet  Other abnormalities of gait and mobility  Chronic low back pain, unspecified back pain laterality, unspecified whether sciatica present     Problem List Patient Active Problem List   Diagnosis Date Noted  . Chronic kidney disease (CKD), stage IV (severe) ([place]) 11/27/2015  . Acute on chronic diastolic CHF (congestive heart failure) ([place]) 11/27/2015  . Normocytic anemia 11/27/2015  . AKI (acute kidney injury) ([place])   . Acute kidney injury superimposed on chronic kidney disease ([place]) 10/02/2015  . Atrial fibrillation with RVR ([place]) 10/02/2015  . Hypotension 10/02/2015  . Anorexia   . Adjustment disorder with mixed emotional features 09/01/2015  . Acute encephalopathy 08/29/2015  . Elevated troponin 08/29/2015  . Renal cancer ([place]) 08/28/2015  . Spinal stenosis of lumbar region with  radiculopathy 08/07/2015  . COPD (chronic obstructive pulmonary disease) ([place])   . OSA (obstructive sleep apnea) 07/27/2015  . HNP (herniated nucleus pulposus), lumbar 07/24/2015  . CHF with left ventricular diastolic dysfunction, NYHA class 2 ([place]) 01/28/2014  . PMR (polymyalgia rheumatica) ([place]) 09/09/2013  . Rheumatoid arthritis ([place]) 04/13/2012  . CAD (coronary artery disease) 12/19/2011  . Insulin dependent diabetes mellitus ([place]) 12/19/2011  . Neuropathy ([place]) 12/19/2011    Roshan Salamon PT 01/10/2018, 4:04 PM  [facility] [address] [place], [place], [address] Phone: [phone]   Fax:  [phone]  Name: Jacob [name] MRN: [id number] Date of Birth: [birth year]

## 2018-01-10 NOTE — Telephone Encounter (Signed)
Patient was last seen by Dr. Wynonia Lawman on 10/18/17 and is due for follow up in 04/2018. Refill [facility] [place]

## 2018-01-10 NOTE — Telephone Encounter (Signed)
° ° ° °  1. Which medications need to be refilled? (please list name of each medication and dose if known) Amiodarone hcl 200mg   2. Which pharmacy/location (including street and city if local pharmacy) is medication to be sent to? [facility] [address] [place]  3. Do they need a 30 day or 90 day supply? [place]

## 2018-01-11 ENCOUNTER — Other Ambulatory Visit: Payer: Self-pay | Admitting: Cardiology

## 2018-01-11 NOTE — Telephone Encounter (Signed)
Can you contact [facility] and have them not fill amiodarone at this time? Patient has two bottles already and they keep refilling. Dr. Wynonia Lawman changed to 100 mg only so they have been cutting in half and have plenty.    1. Which medications need to be refilled? (please list name of each medication and dose if known) amlodipine 10mg   2. Which pharmacy/location (including street and city if local pharmacy) is medication to be sent to? [facility] [address]  3. Do they need a 30 day or 90 day supply? [place]

## 2018-01-11 NOTE — Telephone Encounter (Signed)
Awaitng Dr. Wendy Poet review.

## 2018-01-12 ENCOUNTER — Ambulatory Visit: Payer: No Typology Code available for payment source

## 2018-01-12 DIAGNOSIS — G8929 Other chronic pain: Secondary | ICD-10-CM

## 2018-01-12 DIAGNOSIS — R2689 Other abnormalities of gait and mobility: Secondary | ICD-10-CM

## 2018-01-12 DIAGNOSIS — M6281 Muscle weakness (generalized): Secondary | ICD-10-CM | POA: Diagnosis not present

## 2018-01-12 DIAGNOSIS — R2681 Unsteadiness on feet: Secondary | ICD-10-CM

## 2018-01-12 DIAGNOSIS — M545 Low back pain, unspecified: Secondary | ICD-10-CM

## 2018-01-12 NOTE — Telephone Encounter (Signed)
Please fill once K reviews.

## 2018-01-12 NOTE — Patient Instructions (Signed)
Access Code: [email or web address]  URL: [email or web address]  Date: 01/12/2018  Prepared by: Sigurd Sos   Program Notes  stand at counter and hold on for safety   Exercises  Standing Hip Flexor Stretch - 3 sets - 60 sec hold - 2x daily - 7x weekly  Seated Long Arc Quad - 10 reps - 2 sets - 5 hold - 3x daily - 7x weekly  Seated March - 10 reps - 3 sets - 3x daily - 7x weekly  Seated Heel Toe Raises - 10 reps - 2 sets - 3x daily - 7x weekly  Seated Isometric Hip Adduction with Ball - 10 reps - 2 sets - 3x daily - 7x weekly

## 2018-01-12 NOTE — Therapy (Signed)
[facility] [address], [place], [place], [address] Phone: [phone]   Fax:  [phone]  Physical Therapy Treatment  Patient Details  Name: Jacob [name] MRN: [id number] Date of Birth: 09/22/1940 Referring Provider (PT): Annie Sable, MD   Encounter Date: 01/12/2018  PT End of Session - 01/12/18 1439    Visit Number  3    Date for PT Re-Evaluation  02/28/18    Authorization Type  VA    Authorization Time Period  12/28/17 to 03/22/17 (per VA approval0 - 15 visit limit    Authorization - Visit Number  3    Authorization - Number of Visits  15    PT Start Time  0240    PT Stop Time  1441    PT Time Calculation (min)  43 min    Activity Tolerance  Patient tolerated treatment well    Behavior During Therapy  [facility] for tasks assessed/performed       Past Medical History:  Diagnosis Date  . Allergy   . Anemia   . Anxiety   . Arthritis   . Asthma   . BPH (benign prostatic hyperplasia)   . Cancer of kidney ([place])   . Cataract   . CHF (congestive heart failure) ([place])   . Chronic kidney disease    chronic  kidney failure  kidney function at 42%  . COPD (chronic obstructive pulmonary disease) ([place])   . Coronary artery disease    CABG  7 bypasses  . Diabetes mellitus without complication ([place])   . Hepatitis    many years ago  . Hypertension   . Polymyalgia rheumatica (HCC)    maintained on Prednisone, Plaquenil. Followed by rhuematology every 4 months/[name].  . Shortness of breath dyspnea    with exertion  . Sleep apnea    CPAP   Trying to use    Past Surgical History:  Procedure Laterality Date  . APPENDECTOMY    . CATARACT EXTRACTION W/PHACO Right 11/28/2014   Procedure: CATARACT EXTRACTION PHACO AND INTRAOCULAR LENS PLACEMENT ([name]) RIGHT ;  Surgeon: Marylynn Pearson, MD;  Location: [facility];  Service: Ophthalmology;  Laterality: Right;  . CORONARY ARTERY BYPASS GRAFT  03/17/1995   Wynonia Lawman; followed every six  months.  Marland Kitchen HERNIA REPAIR    . LUMBAR LAMINECTOMY/DECOMPRESSION MICRODISCECTOMY N/A 07/30/2015   Procedure: LUMBAR LAMINECTOMY DISCECTOMY ;  Surgeon: Ashok Pall, MD;  Location: [place] [facility];  Service: Neurosurgery;  Laterality: N/A;  LUMBAR LAMINECTOMY DISCECTOMY   . LUMBAR LAMINECTOMY/DECOMPRESSION MICRODISCECTOMY N/A 09/06/2015   Procedure: Redo L3/4 Disectomy;  Surgeon: Ashok Pall, MD;  Location: [place] [facility];  Service: Neurosurgery;  Laterality: N/A;  . PROSTATE SURGERY     TURP at [place].    There were no vitals filed for this visit.                    [facility] Adult PT Treatment/Exercise - 01/12/18 0001      Exercises   Exercises  Knee/Hip;Ankle      Knee/Hip Exercises: Stretches   Active Hamstring Stretch  Both;60 seconds;3 reps;20 seconds    Hip Flexor Stretch  Both;2 reps;20 seconds      Knee/Hip Exercises: Aerobic   Nustep  L3 x 8 min   PT present to discuss progress     Knee/Hip Exercises: Standing   Heel Raises  Both;2 sets;10 reps    Hip Abduction  Stengthening;Both;2 sets;10 reps      Knee/Hip  Exercises: Seated   Long Arc Quad  Strengthening;Both;2 sets;10 reps    [employer]  x20     Marching  Strengthening;Both;2 sets;10 reps    Abduction/Adduction   Strengthening;Both;2 sets;10 reps;Limitations    Abd/Adduction Limitations  red band    Sit to [employer]  2 sets;5 reps;without UE support      Ankle Exercises: Seated   Heel Raises  20 reps;Both    Toe Raise  20 reps             PT Education - 01/12/18 1422    Education Details   Access Code: [email or web address]     Person(s) Educated  Patient    Methods  Explanation;Demonstration;Handout    Comprehension  Verbalized understanding;Returned demonstration       PT Short Term Goals - 01/12/18 1406      PT SHORT TERM GOAL #2   Title  Pt will report walking for atleast 5 min, 3x/week to improve his endurance activity participation.     Baseline  in constant pain when I am walking    Time  4    Period   Weeks    Status  On-going        PT Long Term Goals - 12/28/17 1542      PT LONG TERM GOAL #1   Title  Pt will demo independence with his advanced HEP to futher promote strenght gains after d/c from PT.     Time  8    Period  Weeks    Status  New    Target Date  02/28/18      PT LONG TERM GOAL #2   Title  Pt will demo improved functional strength and power of the LEs evident by his ability to complete 5x/sit to stand in less than 14 sec with minimal UE use.     Time  8    Period  Weeks    Status  New      PT LONG TERM GOAL #3   Title  Pt will have increase in BLE strength to atleast 4/5 MMT which will improve his ability to maintain upright standing posture throughout the day.     Time  8    Period  Weeks    Status  New      PT LONG TERM GOAL #4   Title  Pt will have atleast 6 points increase in his BERG balance score which will indicate a significant improvement in his balance and decrease fall risk.     Time  8    Period  Weeks    Status  New            Plan - 01/12/18 1419    Clinical Impression Statement  Pt with significant endurance, balance and strength deficits due to chronic pain and weakness after 2 lumbar surgeries.  Pt uses max UE support to get up/down from a chair.  Pt didn't have established HEP and PT spent session building HEP for seated LE strength.  Pt with increased LBP with standing so limited ability to stand for exercise.  Pt required stand by assistance for all exercises for safety and cueing for technique.  Pt will continue to benefit from skilled PT for strength, balance and endurance activity to improve safety and endurance at home and in the community.      Rehab Potential  Good    PT Frequency  2x / week    PT Duration  8 weeks  PT Treatment/Interventions  ADLs/Self Care Home Management;Moist Heat;Traction;Electrical Stimulation;Cryotherapy;Functional mobility training;Therapeutic activities;Therapeutic exercise;Patient/family  education;Manual techniques;Passive range of motion;Neuromuscular re-education;Balance training;Taping;Spinal Manipulations;Dry needling    PT Next Visit Plan  Review new HEP, implement LE flexibility (hip flexor) and hip strengthening program; gentle trunk strengthening; static balance activity     PT Home Exercise Plan   Access Code: [email or web address]     Consulted and Agree with Plan of Care  Patient       Patient will benefit from skilled therapeutic intervention in order to improve the following deficits and impairments:  Abnormal gait, Decreased activity tolerance, Decreased safety awareness, Decreased strength, Impaired flexibility, Postural dysfunction, Pain, Obesity, Improper body mechanics, Decreased range of motion, Decreased endurance, Decreased balance, Decreased mobility, Difficulty walking, Hypomobility, Increased muscle spasms  Visit Diagnosis: Muscle weakness (generalized)  Unsteadiness on feet  Other abnormalities of gait and mobility  Chronic low back pain, unspecified back pain laterality, unspecified whether sciatica present     Problem List Patient Active Problem List   Diagnosis Date Noted  . Chronic kidney disease (CKD), stage IV (severe) ([place]) 11/27/2015  . Acute on chronic diastolic CHF (congestive heart failure) ([place]) 11/27/2015  . Normocytic anemia 11/27/2015  . AKI (acute kidney injury) ([place])   . Acute kidney injury superimposed on chronic kidney disease ([place]) 10/02/2015  . Atrial fibrillation with RVR ([place]) 10/02/2015  . Hypotension 10/02/2015  . Anorexia   . Adjustment disorder with mixed emotional features 09/01/2015  . Acute encephalopathy 08/29/2015  . Elevated troponin 08/29/2015  . Renal cancer ([place]) 08/28/2015  . Spinal stenosis of lumbar region with radiculopathy 08/07/2015  . COPD (chronic obstructive pulmonary disease) ([place])   . OSA (obstructive sleep apnea) 07/27/2015  . HNP (herniated nucleus pulposus), lumbar 07/24/2015  . CHF with left  ventricular diastolic dysfunction, NYHA class 2 ([place]) 01/28/2014  . PMR (polymyalgia rheumatica) ([place]) 09/09/2013  . Rheumatoid arthritis ([place]) 04/13/2012  . CAD (coronary artery disease) 12/19/2011  . Insulin dependent diabetes mellitus ([place]) 12/19/2011  . Neuropathy ([place]) 12/19/2011     Sigurd Sos, PT 01/12/18 2:42 PM  [facility] [address], [place], [place], [address] Phone: [phone]   Fax:  [phone]  Name: Jacob [name] MRN: [id number] Date of Birth: Jun 23, 1940

## 2018-01-12 NOTE — Telephone Encounter (Signed)
Contacted patient to confirm that this patient needs Amlodipine refilled. He stated that he had found a refill, but will either contact us or Dr. Thurman Coyer office.

## 2018-01-17 ENCOUNTER — Ambulatory Visit: Payer: No Typology Code available for payment source | Attending: Internal Medicine

## 2018-01-17 DIAGNOSIS — M545 Low back pain, unspecified: Secondary | ICD-10-CM

## 2018-01-17 DIAGNOSIS — R2681 Unsteadiness on feet: Secondary | ICD-10-CM | POA: Insufficient documentation

## 2018-01-17 DIAGNOSIS — G8929 Other chronic pain: Secondary | ICD-10-CM | POA: Insufficient documentation

## 2018-01-17 DIAGNOSIS — M6281 Muscle weakness (generalized): Secondary | ICD-10-CM | POA: Insufficient documentation

## 2018-01-17 DIAGNOSIS — R2689 Other abnormalities of gait and mobility: Secondary | ICD-10-CM | POA: Insufficient documentation

## 2018-01-17 NOTE — Therapy (Signed)
[facility] [address], [place], [place], [address] Phone: [phone]   Fax:  [phone]  Physical Therapy Treatment  Patient Details  Name: Jacob [name] MRN: [id number] Date of Birth: 02-09-1941 Referring Provider (PT): Annie Sable, MD   Encounter Date: 01/17/2018  PT End of Session - 01/17/18 1521    Visit Number  4    Date for PT Re-Evaluation  02/28/18    Authorization Type  VA    Authorization Time Period  12/28/17 to 03/22/17 (per VA approval0 - 15 visit limit    Authorization - Visit Number  4    Authorization - Number of Visits  15    PT Start Time  1440    PT Stop Time  1522    PT Time Calculation (min)  42 min    Activity Tolerance  Patient tolerated treatment well    Behavior During Therapy  [facility] for tasks assessed/performed       Past Medical History:  Diagnosis Date  . Allergy   . Anemia   . Anxiety   . Arthritis   . Asthma   . BPH (benign prostatic hyperplasia)   . Cancer of kidney ([place])   . Cataract   . CHF (congestive heart failure) ([place])   . Chronic kidney disease    chronic  kidney failure  kidney function at 42%  . COPD (chronic obstructive pulmonary disease) ([place])   . Coronary artery disease    CABG  7 bypasses  . Diabetes mellitus without complication ([place])   . Hepatitis    many years ago  . Hypertension   . Polymyalgia rheumatica (HCC)    maintained on Prednisone, Plaquenil. Followed by rhuematology every 4 months/[name].  . Shortness of breath dyspnea    with exertion  . Sleep apnea    CPAP   Trying to use    Past Surgical History:  Procedure Laterality Date  . APPENDECTOMY    . CATARACT EXTRACTION W/PHACO Right 11/28/2014   Procedure: CATARACT EXTRACTION PHACO AND INTRAOCULAR LENS PLACEMENT ([place]) RIGHT ;  Surgeon: Marylynn Pearson, MD;  Location: [place];  Service: Ophthalmology;  Laterality: Right;  . CORONARY ARTERY BYPASS GRAFT  03/17/1995   Wynonia Lawman; followed every six  months.  Marland Kitchen HERNIA REPAIR    . LUMBAR LAMINECTOMY/DECOMPRESSION MICRODISCECTOMY N/A 07/30/2015   Procedure: LUMBAR LAMINECTOMY DISCECTOMY ;  Surgeon: Ashok Pall, MD;  Location: [place] [facility];  Service: Neurosurgery;  Laterality: N/A;  LUMBAR LAMINECTOMY DISCECTOMY   . LUMBAR LAMINECTOMY/DECOMPRESSION MICRODISCECTOMY N/A 09/06/2015   Procedure: Redo L3/4 Disectomy;  Surgeon: Ashok Pall, MD;  Location: [place] [facility];  Service: Neurosurgery;  Laterality: N/A;  . PROSTATE SURGERY     TURP at [place].    There were no vitals filed for this visit.  Subjective Assessment - 01/17/18 1446    Subjective  Do you think a floor cycle would be good for me to do at home? [facility] will cover if it would be good for me to do.      Pertinent History  CABG, lumbar lamiectomy and microdiscectomy, DM with neuropathy    Currently in Pain?  Yes    Pain Score  0-No pain                       [facility] Adult PT Treatment/Exercise - 01/17/18 0001      Exercises   Exercises  Knee/Hip;Ankle      Knee/Hip Exercises: Stretches  Active Hamstring Stretch  Both;60 seconds;3 reps;20 seconds    Other Knee/Hip Stretches  seated shoulder flexion 8# 2x7      Knee/Hip Exercises: Aerobic   Nustep  L3 x 8 min   PT present to discuss progress     Knee/Hip Exercises: Standing   Heel Raises  Both;2 sets;10 reps    Hip Abduction  Stengthening;Both;2 sets;10 reps    Hip Extension  Stengthening;Both;2 sets;10 reps      Knee/Hip Exercises: Seated   Long Arc Quad  Strengthening;Both;2 sets;10 reps    Ball Squeeze  x20     Marching  Strengthening;Both;2 sets;10 reps    Abduction/Adduction   Strengthening;Both;2 sets;10 reps;Limitations    Abd/Adduction Limitations  red band    Sit to [employer]  2 sets;5 reps;without UE support      Ankle Exercises: Seated   Heel Raises  20 reps;Both    Toe Raise  20 reps               PT Short Term Goals - 01/12/18 1406      PT SHORT TERM GOAL #2   Title  Pt will report  walking for atleast 5 min, 3x/week to improve his endurance activity participation.     Baseline  in constant pain when I am walking    Time  4    Period  Weeks    Status  On-going        PT Long Term Goals - 12/28/17 1542      PT LONG TERM GOAL #1   Title  Pt will demo independence with his advanced HEP to futher promote strenght gains after d/c from PT.     Time  8    Period  Weeks    Status  New    Target Date  02/28/18      PT LONG TERM GOAL #2   Title  Pt will demo improved functional strength and power of the LEs evident by his ability to complete 5x/sit to stand in less than 14 sec with minimal UE use.     Time  8    Period  Weeks    Status  New      PT LONG TERM GOAL #3   Title  Pt will have increase in BLE strength to atleast 4/5 MMT which will improve his ability to maintain upright standing posture throughout the day.     Time  8    Period  Weeks    Status  New      PT LONG TERM GOAL #4   Title  Pt will have atleast 6 points increase in his BERG balance score which will indicate a significant improvement in his balance and decrease fall risk.     Time  8    Period  Weeks    Status  New            Plan - 01/17/18 1451    Clinical Impression Statement  Pt with significant endurance, balance and strength deficits due to chronic pain and weakness after 2 lumbar surgeries.  Pt has been performing HEP for strength and flexibility at home and demonstrated all correctly today.  Pt requires stand by assistance for safety with exercise in standing today.  Pt with increased pain with standing so limited tolerance for this.  Pt will benefit from skilled PT for balance, strength, endurance and flexibility to improve tolerance for home and work tasks.      Rehab  Potential  Good    PT Frequency  2x / week    PT Duration  8 weeks    PT Treatment/Interventions  ADLs/Self Care Home Management;Moist Heat;Traction;Electrical Stimulation;Cryotherapy;Functional mobility  training;Therapeutic activities;Therapeutic exercise;Patient/family education;Manual techniques;Passive range of motion;Neuromuscular re-education;Balance training;Taping;Spinal Manipulations;Dry needling    PT Next Visit Plan  implement LE flexibility (hip flexor) and hip strengthening program; gentle trunk strengthening; static balance activity     PT Home Exercise Plan   Access Code: [email or web address]     Consulted and Agree with Plan of Care  Patient       Patient will benefit from skilled therapeutic intervention in order to improve the following deficits and impairments:  Abnormal gait, Decreased activity tolerance, Decreased safety awareness, Decreased strength, Impaired flexibility, Postural dysfunction, Pain, Obesity, Improper body mechanics, Decreased range of motion, Decreased endurance, Decreased balance, Decreased mobility, Difficulty walking, Hypomobility, Increased muscle spasms  Visit Diagnosis: Muscle weakness (generalized)  Unsteadiness on feet  Other abnormalities of gait and mobility  Chronic low back pain, unspecified back pain laterality, unspecified whether sciatica present     Problem List Patient Active Problem List   Diagnosis Date Noted  . Chronic kidney disease (CKD), stage IV (severe) ([place]) 11/27/2015  . Acute on chronic diastolic CHF (congestive heart failure) ([place]) 11/27/2015  . Normocytic anemia 11/27/2015  . AKI (acute kidney injury) ([place])   . Acute kidney injury superimposed on chronic kidney disease ([place]) 10/02/2015  . Atrial fibrillation with RVR ([place]) 10/02/2015  . Hypotension 10/02/2015  . Anorexia   . Adjustment disorder with mixed emotional features 09/01/2015  . Acute encephalopathy 08/29/2015  . Elevated troponin 08/29/2015  . Renal cancer ([place]) 08/28/2015  . Spinal stenosis of lumbar region with radiculopathy 08/07/2015  . COPD (chronic obstructive pulmonary disease) ([place])   . OSA (obstructive sleep apnea) 07/27/2015  . HNP (herniated nucleus  pulposus), lumbar 07/24/2015  . CHF with left ventricular diastolic dysfunction, NYHA class 2 ([place]) 01/28/2014  . PMR (polymyalgia rheumatica) ([place]) 09/09/2013  . Rheumatoid arthritis ([place]) 04/13/2012  . CAD (coronary artery disease) 12/19/2011  . Insulin dependent diabetes mellitus ([place]) 12/19/2011  . Neuropathy ([place]) 12/19/2011   Sigurd Sos, PT 01/17/18 3:22 PM   [facility] [address] [place], [place], [address] Phone: [phone]   Fax:  [phone]  Name: JAHMARI ESBENSHADE MRN: [id number] Date of Birth: Sep 30, 1940

## 2018-01-19 ENCOUNTER — Ambulatory Visit: Payer: No Typology Code available for payment source

## 2018-01-19 DIAGNOSIS — M6281 Muscle weakness (generalized): Secondary | ICD-10-CM

## 2018-01-19 DIAGNOSIS — R2689 Other abnormalities of gait and mobility: Secondary | ICD-10-CM

## 2018-01-19 DIAGNOSIS — G8929 Other chronic pain: Secondary | ICD-10-CM

## 2018-01-19 DIAGNOSIS — M545 Low back pain, unspecified: Secondary | ICD-10-CM

## 2018-01-19 DIAGNOSIS — R2681 Unsteadiness on feet: Secondary | ICD-10-CM

## 2018-01-19 NOTE — Therapy (Signed)
[facility] [address] [place], [place], [address] Phone: [phone]   Fax:  [phone]  Physical Therapy Treatment  Patient Details  Name: Jacob [name] MRN: [id number] Date of Birth: [birth year] Referring Provider (PT): Annie Sable, MD   Encounter Date: 01/19/2018  PT End of Session - 01/19/18 1445    Visit Number  5    Date for PT Re-Evaluation  02/28/18    Authorization Type  VA    Authorization Time Period  12/28/17 to 03/22/17 (per VA approval) - 15 visit limit    Authorization - Visit Number  5    Authorization - Number of Visits  15    PT Start Time  1324    PT Stop Time  1443    PT Time Calculation (min)  48 min    Activity Tolerance  Patient tolerated treatment well    Behavior During Therapy  [facility] for tasks assessed/performed       Past Medical History:  Diagnosis Date  . Allergy   . Anemia   . Anxiety   . Arthritis   . Asthma   . BPH (benign prostatic hyperplasia)   . Cancer of kidney ([place])   . Cataract   . CHF (congestive heart failure) ([place])   . Chronic kidney disease    chronic  kidney failure  kidney function at 42%  . COPD (chronic obstructive pulmonary disease) ([place])   . Coronary artery disease    CABG  7 bypasses  . Diabetes mellitus without complication ([place])   . Hepatitis    many years ago  . Hypertension   . Polymyalgia rheumatica (HCC)    maintained on Prednisone, Plaquenil. Followed by rhuematology every 4 months/[name].  . Shortness of breath dyspnea    with exertion  . Sleep apnea    CPAP   Trying to use    Past Surgical History:  Procedure Laterality Date  . APPENDECTOMY    . CATARACT EXTRACTION W/PHACO Right 11/28/2014   Procedure: CATARACT EXTRACTION PHACO AND INTRAOCULAR LENS PLACEMENT ([place]) RIGHT ;  Surgeon: Marylynn Pearson, MD;  Location: [place];  Service: Ophthalmology;  Laterality: Right;  . CORONARY ARTERY BYPASS GRAFT  03/17/1995   Wynonia Lawman; followed every six  months.  Marland Kitchen HERNIA REPAIR    . LUMBAR LAMINECTOMY/DECOMPRESSION MICRODISCECTOMY N/A 07/30/2015   Procedure: LUMBAR LAMINECTOMY DISCECTOMY ;  Surgeon: Ashok Pall, MD;  Location: [place] [facility];  Service: Neurosurgery;  Laterality: N/A;  LUMBAR LAMINECTOMY DISCECTOMY   . LUMBAR LAMINECTOMY/DECOMPRESSION MICRODISCECTOMY N/A 09/06/2015   Procedure: Redo L3/4 Disectomy;  Surgeon: Ashok Pall, MD;  Location: [place] [facility];  Service: Neurosurgery;  Laterality: N/A;  . PROSTATE SURGERY     TURP at [place].    There were no vitals filed for this visit.  Subjective Assessment - 01/19/18 1400    Subjective  I'm doing good.  I am working on my exercises.      Pertinent History  CABG, lumbar lamiectomy and microdiscectomy, DM with neuropathy                       [facility] Adult PT Treatment/Exercise - 01/19/18 0001      Exercises   Exercises  Knee/Hip;Ankle      Knee/Hip Exercises: Stretches   Active Hamstring Stretch  Both;60 seconds;3 reps;20 seconds    Other Knee/Hip Stretches  seated shoulder flexion 8# 2x7      Knee/Hip Exercises: Aerobic   Nustep  L3 x 10 min   PT present to discuss progress     Knee/Hip Exercises: Standing   Heel Raises  Both;2 sets;10 reps    Hip Abduction  Stengthening;Both;2 sets;10 reps    Abduction Limitations  2#    Hip Extension  Stengthening;Both;2 sets;10 reps    Extension Limitations  2#      Knee/Hip Exercises: Seated   Long Arc Quad  Strengthening;Both;2 sets;10 reps    Long Arc Quad Weight  2 lbs.    Ball Squeeze  x20     Marching  Strengthening;Both;2 sets;10 reps    [employer]  2 lbs.    Abduction/Adduction   Strengthening;Both;2 sets;10 reps;Limitations    Abd/Adduction Limitations  red band      Ankle Exercises: Seated   Heel Raises  20 reps;Both               PT Short Term Goals - 01/12/18 1406      PT SHORT TERM GOAL #2   Title  Pt will report walking for atleast 5 min, 3x/week to improve his endurance activity  participation.     Baseline  in constant pain when I am walking    Time  4    Period  Weeks    Status  On-going        PT Long Term Goals - 12/28/17 1542      PT LONG TERM GOAL #1   Title  Pt will demo independence with his advanced HEP to futher promote strenght gains after d/c from PT.     Time  8    Period  Weeks    Status  New    Target Date  02/28/18      PT LONG TERM GOAL #2   Title  Pt will demo improved functional strength and power of the LEs evident by his ability to complete 5x/sit to stand in less than 14 sec with minimal UE use.     Time  8    Period  Weeks    Status  New      PT LONG TERM GOAL #3   Title  Pt will have increase in BLE strength to atleast 4/5 MMT which will improve his ability to maintain upright standing posture throughout the day.     Time  8    Period  Weeks    Status  New      PT LONG TERM GOAL #4   Title  Pt will have atleast 6 points increase in his BERG balance score which will indicate a significant improvement in his balance and decrease fall risk.     Time  8    Period  Weeks    Status  New            Plan - 01/19/18 1420    Clinical Impression Statement  Pt was able to tolerate addition of 2# ankle weights with both seated and standing exercises and additional time on the NuStep.  Pt has been performing strength and flexibility at home.  Pt is limited to standing long periods due to LBP and is able to tolerate short bouts of standing in the clinic with exercise.  Pt requires stand by assistance for safety with standing exercise.  Pt with chronic pain, weakness and endurance deficits and will continue to benefit from skilled PT for strength, balance and endurance.      Rehab Potential  Good    PT Frequency  2x /  week    PT Duration  8 weeks    PT Treatment/Interventions  ADLs/Self Care Home Management;Moist Heat;Traction;Electrical Stimulation;Cryotherapy;Functional mobility training;Therapeutic activities;Therapeutic  exercise;Patient/family education;Manual techniques;Passive range of motion;Neuromuscular re-education;Balance training;Taping;Spinal Manipulations;Dry needling    PT Next Visit Plan   hip strengthening program; gentle trunk strengthening; static balance activity    PT Home Exercise Plan   Access Code: [email or web address]     Consulted and Agree with Plan of Care  Patient       Patient will benefit from skilled therapeutic intervention in order to improve the following deficits and impairments:  Abnormal gait, Decreased activity tolerance, Decreased safety awareness, Decreased strength, Impaired flexibility, Postural dysfunction, Pain, Obesity, Improper body mechanics, Decreased range of motion, Decreased endurance, Decreased balance, Decreased mobility, Difficulty walking, Hypomobility, Increased muscle spasms  Visit Diagnosis: Muscle weakness (generalized)  Unsteadiness on feet  Chronic low back pain, unspecified back pain laterality, unspecified whether sciatica present  Other abnormalities of gait and mobility     Problem List Patient Active Problem List   Diagnosis Date Noted  . Chronic kidney disease (CKD), stage IV (severe) ([place]) 11/27/2015  . Acute on chronic diastolic CHF (congestive heart failure) ([place]) 11/27/2015  . Normocytic anemia 11/27/2015  . AKI (acute kidney injury) ([place])   . Acute kidney injury superimposed on chronic kidney disease ([place]) 10/02/2015  . Atrial fibrillation with RVR ([place]) 10/02/2015  . Hypotension 10/02/2015  . Anorexia   . Adjustment disorder with mixed emotional features 09/01/2015  . Acute encephalopathy 08/29/2015  . Elevated troponin 08/29/2015  . Renal cancer ([place]) 08/28/2015  . Spinal stenosis of lumbar region with radiculopathy 08/07/2015  . COPD (chronic obstructive pulmonary disease) ([place])   . OSA (obstructive sleep apnea) 07/27/2015  . HNP (herniated nucleus pulposus), lumbar 07/24/2015  . CHF with left ventricular diastolic dysfunction, NYHA  class 2 ([place]) 01/28/2014  . PMR (polymyalgia rheumatica) ([place]) 09/09/2013  . Rheumatoid arthritis ([place]) 04/13/2012  . CAD (coronary artery disease) 12/19/2011  . Insulin dependent diabetes mellitus ([place]) 12/19/2011  . Neuropathy ([place]) 12/19/2011    Jacob [name], PT 01/19/18 2:49 PM  [facility] [address] [place], [place], [address] Phone: [phone]   Fax:  [phone]  Name: Jacob [name] MRN: [id number] Date of Birth: 22-Apr-1940

## 2018-01-24 ENCOUNTER — Ambulatory Visit: Payer: No Typology Code available for payment source

## 2018-01-24 DIAGNOSIS — R2689 Other abnormalities of gait and mobility: Secondary | ICD-10-CM

## 2018-01-24 DIAGNOSIS — M545 Low back pain, unspecified: Secondary | ICD-10-CM

## 2018-01-24 DIAGNOSIS — M6281 Muscle weakness (generalized): Secondary | ICD-10-CM

## 2018-01-24 DIAGNOSIS — R2681 Unsteadiness on feet: Secondary | ICD-10-CM

## 2018-01-24 DIAGNOSIS — G8929 Other chronic pain: Secondary | ICD-10-CM

## 2018-01-24 NOTE — Therapy (Signed)
[facility] [address], [name], [place], [address] Phone: [phone]   Fax:  [phone]  Physical Therapy Treatment  Patient Details  Name: [name] MRN: [id number] Date of Birth: 1941/02/28 Referring Provider (PT): Annie Sable, MD   Encounter Date: 01/24/2018  PT End of Session - 01/24/18 1434    Visit Number  6    Date for PT Re-Evaluation  02/28/18    Authorization Type  VA    Authorization Time Period  12/28/17 to 03/22/17 (per VA approval) - 15 visit limit    Authorization - Visit Number  6    Authorization - Number of Visits  15    PT Start Time  0102    PT Stop Time  1436    PT Time Calculation (min)  39 min    Activity Tolerance  Patient tolerated treatment well    Behavior During Therapy  [facility] for tasks assessed/performed       Past Medical History:  Diagnosis Date  . Allergy   . Anemia   . Anxiety   . Arthritis   . Asthma   . BPH (benign prostatic hyperplasia)   . Cancer of kidney ([place])   . Cataract   . CHF (congestive heart failure) ([place])   . Chronic kidney disease    chronic  kidney failure  kidney function at 42%  . COPD (chronic obstructive pulmonary disease) ([place])   . Coronary artery disease    CABG  7 bypasses  . Diabetes mellitus without complication ([place])   . Hepatitis    many years ago  . Hypertension   . Polymyalgia rheumatica (HCC)    maintained on Prednisone, Plaquenil. Followed by rhuematology every 4 months/[name].  . Shortness of breath dyspnea    with exertion  . Sleep apnea    CPAP   Trying to use    Past Surgical History:  Procedure Laterality Date  . APPENDECTOMY    . CATARACT EXTRACTION W/PHACO Right 11/28/2014   Procedure: CATARACT EXTRACTION PHACO AND INTRAOCULAR LENS PLACEMENT ([place]) RIGHT ;  Surgeon: Marylynn Pearson, MD;  Location: [place];  Service: Ophthalmology;  Laterality: Right;  . CORONARY ARTERY BYPASS GRAFT  03/17/1995   Wynonia Lawman; followed every six  months.  Marland Kitchen HERNIA REPAIR    . LUMBAR LAMINECTOMY/DECOMPRESSION MICRODISCECTOMY N/A 07/30/2015   Procedure: LUMBAR LAMINECTOMY DISCECTOMY ;  Surgeon: Ashok Pall, MD;  Location: [place] [facility];  Service: Neurosurgery;  Laterality: N/A;  LUMBAR LAMINECTOMY DISCECTOMY   . LUMBAR LAMINECTOMY/DECOMPRESSION MICRODISCECTOMY N/A 09/06/2015   Procedure: Redo L3/4 Disectomy;  Surgeon: Ashok Pall, MD;  Location: [place] [facility];  Service: Neurosurgery;  Laterality: N/A;  . PROSTATE SURGERY     TURP at [place].    There were no vitals filed for this visit.  Subjective Assessment - 01/24/18 1401    Subjective  I'm not doing well.  I had 2 falls over the weekend.  It happened both times when I was getting up from a chair and my legs gave out bringing me to my knees.      Pertinent History  CABG, lumbar lamiectomy and microdiscectomy, DM with neuropathy    Currently in Pain?  No/denies                       [facility] Adult PT Treatment/Exercise - 01/24/18 0001      Knee/Hip Exercises: Stretches   Active Hamstring Stretch  Both;60 seconds;3 reps;20 seconds  Other Knee/Hip Stretches  seated shoulder flexion 5# 2x10      Knee/Hip Exercises: Aerobic   Nustep  L3 x 10 min   PT present to discuss progress     Knee/Hip Exercises: Standing   Heel Raises  Both;2 sets;10 reps    Hip Abduction  Stengthening;Both;2 sets;10 reps    Abduction Limitations  2#    Hip Extension  Stengthening;Both;2 sets;10 reps    Extension Limitations  2#      Knee/Hip Exercises: Seated   Long Arc Quad  Strengthening;Both;2 sets;10 reps    Long Arc Quad Weight  2 lbs.    Ball Squeeze  x20     Marching  Strengthening;Both;2 sets;10 reps    [employer]  2 lbs.    Abduction/Adduction   Strengthening;Both;2 sets;10 reps;Limitations    Abd/Adduction Limitations  red band      Ankle Exercises: Seated   Heel Raises  20 reps;Both               PT Short Term Goals - 01/12/18 1406      PT SHORT TERM GOAL  #2   Title  Pt will report walking for atleast 5 min, 3x/week to improve his endurance activity participation.     Baseline  in constant pain when I am walking    Time  4    Period  Weeks    Status  On-going        PT Long Term Goals - 12/28/17 1542      PT LONG TERM GOAL #1   Title  Pt will demo independence with his advanced HEP to futher promote strenght gains after d/c from PT.     Time  8    Period  Weeks    Status  New    Target Date  02/28/18      PT LONG TERM GOAL #2   Title  Pt will demo improved functional strength and power of the LEs evident by his ability to complete 5x/sit to stand in less than 14 sec with minimal UE use.     Time  8    Period  Weeks    Status  New      PT LONG TERM GOAL #3   Title  Pt will have increase in BLE strength to atleast 4/5 MMT which will improve his ability to maintain upright standing posture throughout the day.     Time  8    Period  Weeks    Status  New      PT LONG TERM GOAL #4   Title  Pt will have atleast 6 points increase in his BERG balance score which will indicate a significant improvement in his balance and decrease fall risk.     Time  8    Period  Weeks    Status  New            Plan - 01/24/18 1407    Clinical Impression Statement  Pt had 2 falls at home over the past few days when he was coming from sit to stand.  No injury, just lowered to knees.   Pt has been performing strength and flexibility at home as able.   Pt is limited to standing long periods due to LBP and is able to tolerate short bouts of standing in the clinic with exercise.  Pt requires stand by assistance for safety with standing exercise due to balance deficits and recent falls.  Pt tolerated  10 minutes on the NuStep and addition of 2# ankle weights for seated and standing exercises.  Pt with chronic pain, weakness and endurance deficits and will continue to benefit from skilled PT for strength, balance and endurance.       PT Frequency  2x /  week    PT Duration  8 weeks    PT Treatment/Interventions  ADLs/Self Care Home Management;Moist Heat;Traction;Electrical Stimulation;Cryotherapy;Functional mobility training;Therapeutic activities;Therapeutic exercise;Patient/family education;Manual techniques;Passive range of motion;Neuromuscular re-education;Balance training;Taping;Spinal Manipulations;Dry needling    PT Next Visit Plan  strength, endurance, flexibility and balance exercises     PT Home Exercise Plan   Access Code: [email or web address]     Consulted and Agree with Plan of Care  Patient       Patient will benefit from skilled therapeutic intervention in order to improve the following deficits and impairments:  Abnormal gait, Decreased activity tolerance, Decreased safety awareness, Decreased strength, Impaired flexibility, Postural dysfunction, Pain, Obesity, Improper body mechanics, Decreased range of motion, Decreased endurance, Decreased balance, Decreased mobility, Difficulty walking, Hypomobility, Increased muscle spasms  Visit Diagnosis: Muscle weakness (generalized)  Unsteadiness on feet  Chronic low back pain, unspecified back pain laterality, unspecified whether sciatica present  Other abnormalities of gait and mobility     Problem List Patient Active Problem List   Diagnosis Date Noted  . Chronic kidney disease (CKD), stage IV (severe) ([place]) 11/27/2015  . Acute on chronic diastolic CHF (congestive heart failure) ([place]) 11/27/2015  . Normocytic anemia 11/27/2015  . AKI (acute kidney injury) ([place])   . Acute kidney injury superimposed on chronic kidney disease ([place]) 10/02/2015  . Atrial fibrillation with RVR ([place]) 10/02/2015  . Hypotension 10/02/2015  . Anorexia   . Adjustment disorder with mixed emotional features 09/01/2015  . Acute encephalopathy 08/29/2015  . Elevated troponin 08/29/2015  . Renal cancer ([place]) 08/28/2015  . Spinal stenosis of lumbar region with radiculopathy 08/07/2015  . COPD (chronic  obstructive pulmonary disease) ([place])   . OSA (obstructive sleep apnea) 07/27/2015  . HNP (herniated nucleus pulposus), lumbar 07/24/2015  . CHF with left ventricular diastolic dysfunction, NYHA class 2 ([place]) 01/28/2014  . PMR (polymyalgia rheumatica) ([place]) 09/09/2013  . Rheumatoid arthritis ([place]) 04/13/2012  . CAD (coronary artery disease) 12/19/2011  . Insulin dependent diabetes mellitus ([place]) 12/19/2011  . Neuropathy ([place]) 12/19/2011   Sigurd Sos, PT 01/24/18 2:35 PM  [facility] [address] [place], [place], [address] Phone: [phone]   Fax:  [phone]  Name: JOSS MCDILL MRN: [id number] Date of Birth: 1940/09/02

## 2018-01-26 ENCOUNTER — Ambulatory Visit: Payer: No Typology Code available for payment source

## 2018-01-26 DIAGNOSIS — R2689 Other abnormalities of gait and mobility: Secondary | ICD-10-CM

## 2018-01-26 DIAGNOSIS — R2681 Unsteadiness on feet: Secondary | ICD-10-CM

## 2018-01-26 DIAGNOSIS — G8929 Other chronic pain: Secondary | ICD-10-CM

## 2018-01-26 DIAGNOSIS — M6281 Muscle weakness (generalized): Secondary | ICD-10-CM

## 2018-01-26 DIAGNOSIS — M545 Low back pain, unspecified: Secondary | ICD-10-CM

## 2018-01-26 NOTE — Therapy (Signed)
[facility] [address], [place], [place], [address] Phone: [phone]   Fax:  [phone]  Physical Therapy Treatment  Patient Details  Name: [name] MRN: [id number] Date of Birth: June 16, 1940 Referring Provider (PT): Annie Sable, MD   Encounter Date: 01/26/2018  PT End of Session - 01/26/18 1446    Visit Number  7    Date for PT Re-Evaluation  02/28/18    Authorization Type  VA    Authorization Time Period  12/28/17 to 03/22/17 (per VA approval) - 15 visit limit    Authorization - Visit Number  7    Authorization - Number of Visits  15    PT Start Time  5956    PT Stop Time  1444    PT Time Calculation (min)  47 min    Activity Tolerance  Patient tolerated treatment well    Behavior During Therapy  [facility] for tasks assessed/performed       Past Medical History:  Diagnosis Date  . Allergy   . Anemia   . Anxiety   . Arthritis   . Asthma   . BPH (benign prostatic hyperplasia)   . Cancer of kidney ([place])   . Cataract   . CHF (congestive heart failure) ([place])   . Chronic kidney disease    chronic  kidney failure  kidney function at 42%  . COPD (chronic obstructive pulmonary disease) ([place])   . Coronary artery disease    CABG  7 bypasses  . Diabetes mellitus without complication ([place])   . Hepatitis    many years ago  . Hypertension   . Polymyalgia rheumatica (HCC)    maintained on Prednisone, Plaquenil. Followed by rhuematology every 4 months/[name].  . Shortness of breath dyspnea    with exertion  . Sleep apnea    CPAP   Trying to use    Past Surgical History:  Procedure Laterality Date  . APPENDECTOMY    . CATARACT EXTRACTION W/PHACO Right 11/28/2014   Procedure: CATARACT EXTRACTION PHACO AND INTRAOCULAR LENS PLACEMENT ([place]) RIGHT ;  Surgeon: Marylynn Pearson, MD;  Location: [place];  Service: Ophthalmology;  Laterality: Right;  . CORONARY ARTERY BYPASS GRAFT  03/17/1995   Wynonia Lawman; followed every six  months.  Marland Kitchen HERNIA REPAIR    . LUMBAR LAMINECTOMY/DECOMPRESSION MICRODISCECTOMY N/A 07/30/2015   Procedure: LUMBAR LAMINECTOMY DISCECTOMY ;  Surgeon: Ashok Pall, MD;  Location: [place] [facility];  Service: Neurosurgery;  Laterality: N/A;  LUMBAR LAMINECTOMY DISCECTOMY   . LUMBAR LAMINECTOMY/DECOMPRESSION MICRODISCECTOMY N/A 09/06/2015   Procedure: Redo L3/4 Disectomy;  Surgeon: Ashok Pall, MD;  Location: [place] [facility];  Service: Neurosurgery;  Laterality: N/A;  . PROSTATE SURGERY     TURP at [place].    There were no vitals filed for this visit.  Subjective Assessment - 01/26/18 1356    Subjective  My back is still bothering me.  No falls since last session, I have been trying to be careful.      Pertinent History  CABG, lumbar lamiectomy and microdiscectomy, DM with neuropathy    Currently in Pain?  No/denies   Pain only with standing        [facility] PT Assessment - 01/26/18 0001      Transfers   Five time sit to stand comments   17.8 seconds, UE support                   [facility] Adult PT Treatment/Exercise - 01/26/18 0001  Exercises   Exercises  Lumbar      Lumbar Exercises: Stretches   Active Hamstring Stretch  Both;3 reps;20 seconds      Lumbar Exercises: Standing   Other Standing Lumbar Exercises  standing on black foam pad for core activation 3x30 seconds      Lumbar Exercises: Seated   Other Seated Lumbar Exercises  press down on foam roll with core activation x 10    Other Seated Lumbar Exercises  diagnonals with red ball seated with abdominal bracing 2x10 bil each      Knee/Hip Exercises: Stretches   Other Knee/Hip Stretches  seated shoulder flexion 5# 2x10      Knee/Hip Exercises: Aerobic   Nustep  L3 x 10 min   PT present to discuss progress     Knee/Hip Exercises: Standing   Heel Raises  Both;2 sets;10 reps    Hip Abduction  Stengthening;Both;2 sets;10 reps    Abduction Limitations  2#    Hip Extension  Stengthening;Both;2 sets;10 reps    Extension  Limitations  2#      Knee/Hip Exercises: Seated   Long Arc Quad  Strengthening;Both;2 sets;10 reps    Long Arc Quad Weight  2 lbs.    Ball Squeeze  x20     Marching  Strengthening;Both;2 sets;10 reps    [employer]  2 lbs.    Abduction/Adduction   Strengthening;Both;2 sets;10 reps;Limitations    Abd/Adduction Limitations  red band               PT Short Term Goals - 01/26/18 1431      PT SHORT TERM GOAL #1   Title  Pt will be independent with his initial HEP to improve flexibility, strength and balance.     Status  Achieved      PT SHORT TERM GOAL #2   Title  Pt will report walking for atleast 5 min, 3x/week to improve his endurance activity participation.     Baseline  walking up and down hallway 20 minutes at a time    Status  Achieved      PT SHORT TERM GOAL #3   Title  report  < or = to 6/10 LBP with standing and walking    Status  Achieved        PT Long Term Goals - 01/26/18 1432      PT LONG TERM GOAL #1   Title  Pt will demo independence with his advanced HEP to futher promote strenght gains after d/c from PT.     Time  8    Period  Weeks    Status  On-going            Plan - 01/26/18 1417    Clinical Impression Statement  Pt without any falls since the last session.  Pt continues to report increased LBP and limited tolerance when in standing due to pain.  PT session alternated between sitting and standing activity.  PT provided verbal cues for core activation and alignment with standing activity and stand by assistance for safety.  Pt with chronic weakness and instability and will continue to benefit from skilled PT for strength, balance and endurance to improve safety at home and in the community.      Rehab Potential  Good    PT Frequency  2x / week    PT Duration  8 weeks    PT Treatment/Interventions  ADLs/Self Care Home Management;Moist Heat;Traction;Electrical Stimulation;Cryotherapy;Functional mobility training;Therapeutic  activities;Therapeutic exercise;Patient/family education;Manual  techniques;Passive range of motion;Neuromuscular re-education;Balance training;Taping;Spinal Manipulations;Dry needling    PT Next Visit Plan  strength, endurance, flexibility and balance exercises.  Incorporate core strength into exercise.  Test Merrilee Jansky to address LTG    PT Home Exercise Plan   Access Code: [email or web address]     Consulted and Agree with Plan of Care  Patient       Patient will benefit from skilled therapeutic intervention in order to improve the following deficits and impairments:  Abnormal gait, Decreased activity tolerance, Decreased safety awareness, Decreased strength, Impaired flexibility, Postural dysfunction, Pain, Obesity, Improper body mechanics, Decreased range of motion, Decreased endurance, Decreased balance, Decreased mobility, Difficulty walking, Hypomobility, Increased muscle spasms  Visit Diagnosis: Muscle weakness (generalized)  Unsteadiness on feet  Chronic low back pain, unspecified back pain laterality, unspecified whether sciatica present  Other abnormalities of gait and mobility     Problem List Patient Active Problem List   Diagnosis Date Noted  . Chronic kidney disease (CKD), stage IV (severe) ([place]) 11/27/2015  . Acute on chronic diastolic CHF (congestive heart failure) ([place]) 11/27/2015  . Normocytic anemia 11/27/2015  . AKI (acute kidney injury) ([place])   . Acute kidney injury superimposed on chronic kidney disease ([place]) 10/02/2015  . Atrial fibrillation with RVR ([place]) 10/02/2015  . Hypotension 10/02/2015  . Anorexia   . Adjustment disorder with mixed emotional features 09/01/2015  . Acute encephalopathy 08/29/2015  . Elevated troponin 08/29/2015  . Renal cancer ([place]) 08/28/2015  . Spinal stenosis of lumbar region with radiculopathy 08/07/2015  . COPD (chronic obstructive pulmonary disease) ([place])   . OSA (obstructive sleep apnea) 07/27/2015  . HNP (herniated nucleus pulposus), lumbar  07/24/2015  . CHF with left ventricular diastolic dysfunction, NYHA class 2 ([place]) 01/28/2014  . PMR (polymyalgia rheumatica) ([place]) 09/09/2013  . Rheumatoid arthritis ([place]) 04/13/2012  . CAD (coronary artery disease) 12/19/2011  . Insulin dependent diabetes mellitus ([place]) 12/19/2011  . Neuropathy ([place]) 12/19/2011    Sigurd Sos, PT 01/26/18 2:47 PM  [facility] [address] [place], [place], [address] Phone: [phone]   Fax:  [phone]  Name: [name] MRN: [id number] Date of Birth: May 18, 1940

## 2018-01-31 ENCOUNTER — Ambulatory Visit: Payer: No Typology Code available for payment source

## 2018-01-31 DIAGNOSIS — M6281 Muscle weakness (generalized): Secondary | ICD-10-CM

## 2018-01-31 DIAGNOSIS — G8929 Other chronic pain: Secondary | ICD-10-CM

## 2018-01-31 DIAGNOSIS — R2681 Unsteadiness on feet: Secondary | ICD-10-CM

## 2018-01-31 DIAGNOSIS — R2689 Other abnormalities of gait and mobility: Secondary | ICD-10-CM

## 2018-01-31 DIAGNOSIS — M545 Low back pain, unspecified: Secondary | ICD-10-CM

## 2018-01-31 NOTE — Therapy (Signed)
[facility] [address], [place], [place], [address] Phone: [phone]   Fax:  [phone]  Physical Therapy Treatment  Patient Details  Name: Jacob [name] MRN: [id number] Date of Birth: [birth year] Referring Provider (PT): Annie Sable, MD   Encounter Date: 01/31/2018  PT End of Session - 01/31/18 1437    Visit Number  8    Date for PT Re-Evaluation  02/28/18    Authorization Type  VA    Authorization - Visit Number  8    Authorization - Number of Visits  15    PT Start Time  0092    PT Stop Time  1438    PT Time Calculation (min)  42 min    Activity Tolerance  Patient tolerated treatment well    Behavior During Therapy  [facility] for tasks assessed/performed       Past Medical History:  Diagnosis Date  . Allergy   . Anemia   . Anxiety   . Arthritis   . Asthma   . BPH (benign prostatic hyperplasia)   . Cancer of kidney ([place])   . Cataract   . CHF (congestive heart failure) ([place])   . Chronic kidney disease    chronic  kidney failure  kidney function at 42%  . COPD (chronic obstructive pulmonary disease) ([place])   . Coronary artery disease    CABG  7 bypasses  . Diabetes mellitus without complication ([place])   . Hepatitis    many years ago  . Hypertension   . Polymyalgia rheumatica (HCC)    maintained on Prednisone, Plaquenil. Followed by rhuematology every 4 months/[name].  . Shortness of breath dyspnea    with exertion  . Sleep apnea    CPAP   Trying to use    Past Surgical History:  Procedure Laterality Date  . APPENDECTOMY    . CATARACT EXTRACTION W/PHACO Right 11/28/2014   Procedure: CATARACT EXTRACTION PHACO AND INTRAOCULAR LENS PLACEMENT ([place]) RIGHT ;  Surgeon: Marylynn Pearson, MD;  Location: [place] [facility];  Service: Ophthalmology;  Laterality: Right;  . CORONARY ARTERY BYPASS GRAFT  03/17/1995   Wynonia Lawman; followed every six months.  Marland Kitchen HERNIA REPAIR    . LUMBAR LAMINECTOMY/DECOMPRESSION MICRODISCECTOMY N/A  07/30/2015   Procedure: LUMBAR LAMINECTOMY DISCECTOMY ;  Surgeon: Ashok Pall, MD;  Location: [place] [facility];  Service: Neurosurgery;  Laterality: N/A;  LUMBAR LAMINECTOMY DISCECTOMY   . LUMBAR LAMINECTOMY/DECOMPRESSION MICRODISCECTOMY N/A 09/06/2015   Procedure: Redo L3/4 Disectomy;  Surgeon: Ashok Pall, MD;  Location: [place] [facility];  Service: Neurosurgery;  Laterality: N/A;  . PROSTATE SURGERY     TURP at [place].    There were no vitals filed for this visit.                    [facility] Adult PT Treatment/Exercise - 01/31/18 0001      Lumbar Exercises: Standing   Other Standing Lumbar Exercises  standing on black foam pad for core activation 3x30 seconds      Lumbar Exercises: Seated   Other Seated Lumbar Exercises  press down on foam roll with core activation x 10    Other Seated Lumbar Exercises  diagnonals with red ball seated with abdominal bracing 2x10 bil each      Knee/Hip Exercises: Stretches   Active Hamstring Stretch  Both;3 reps;20 seconds      Knee/Hip Exercises: Aerobic   Nustep  L3 x 10 min   PT present to discuss progress  Knee/Hip Exercises: Standing   Heel Raises  Both;2 sets;10 reps    Hip Abduction  Stengthening;Both;2 sets;10 reps    Abduction Limitations  2#    Hip Extension  Stengthening;Both;2 sets;10 reps    Extension Limitations  2#      Knee/Hip Exercises: Seated   Long Arc Quad  Strengthening;Both;2 sets;10 reps    Long Arc Quad Weight  2 lbs.    Ball Squeeze  x20     Marching  Strengthening;Both;2 sets;10 reps    [employer]  2 lbs.    Abduction/Adduction   Strengthening;Both;2 sets;10 reps;Limitations    Abd/Adduction Limitations  red band               PT Short Term Goals - 01/26/18 1431      PT SHORT TERM GOAL #1   Title  Pt will be independent with his initial HEP to improve flexibility, strength and balance.     Status  Achieved      PT SHORT TERM GOAL #2   Title  Pt will report walking for atleast 5 min,  3x/week to improve his endurance activity participation.     Baseline  walking up and down hallway 20 minutes at a time    Status  Achieved      PT SHORT TERM GOAL #3   Title  report  < or = to 6/10 LBP with standing and walking    Status  Achieved        PT Long Term Goals - 01/31/18 1400      PT LONG TERM GOAL #5   Title  report < or  = to 5/10 LBP with standing and walking    Baseline  up to 8/10- this varies    Time  8    Period  Weeks    Status  On-going            Plan - 01/31/18 1411    Clinical Impression Statement  Pt with continued LBP that is rated as 8/10 today when standing.  PT session alternated between sitting and standing activity to allow for break from the pain.  PT provided verbal cues for core activation and alignment with standing activity and some min guard as needed for safety. Pt reported that his legs feel weak today.   Pt with chronic weakness and instability and will continue to benefit from skilled PT for strength, balance and endurance to improve safety at home and in the community.      Rehab Potential  Good    PT Frequency  2x / week    PT Duration  8 weeks    PT Treatment/Interventions  ADLs/Self Care Home Management;Moist Heat;Traction;Electrical Stimulation;Cryotherapy;Functional mobility training;Therapeutic activities;Therapeutic exercise;Patient/family education;Manual techniques;Passive range of motion;Neuromuscular re-education;Balance training;Taping;Spinal Manipulations;Dry needling    PT Next Visit Plan  strength, endurance, flexibility and balance exercises.  Incorporate core strength into exercise.  Test Berg to address LTG-didn't test today due to leg weakness and LBP    PT Home Exercise Plan   Access Code: [email or web address]     Consulted and Agree with Plan of Care  Patient       Patient will benefit from skilled therapeutic intervention in order to improve the following deficits and impairments:  Abnormal gait, Decreased activity  tolerance, Decreased safety awareness, Decreased strength, Impaired flexibility, Postural dysfunction, Pain, Obesity, Improper body mechanics, Decreased range of motion, Decreased endurance, Decreased balance, Decreased mobility, Difficulty walking, Hypomobility, Increased muscle spasms  Visit Diagnosis:  Muscle weakness (generalized)  Unsteadiness on feet  Chronic low back pain, unspecified back pain laterality, unspecified whether sciatica present  Other abnormalities of gait and mobility     Problem List Patient Active Problem List   Diagnosis Date Noted  . Chronic kidney disease (CKD), stage IV (severe) ([place]) 11/27/2015  . Acute on chronic diastolic CHF (congestive heart failure) ([place]) 11/27/2015  . Normocytic anemia 11/27/2015  . AKI (acute kidney injury) ([place])   . Acute kidney injury superimposed on chronic kidney disease ([place]) 10/02/2015  . Atrial fibrillation with RVR ([place]) 10/02/2015  . Hypotension 10/02/2015  . Anorexia   . Adjustment disorder with mixed emotional features 09/01/2015  . Acute encephalopathy 08/29/2015  . Elevated troponin 08/29/2015  . Renal cancer ([place]) 08/28/2015  . Spinal stenosis of lumbar region with radiculopathy 08/07/2015  . COPD (chronic obstructive pulmonary disease) ([place])   . OSA (obstructive sleep apnea) 07/27/2015  . HNP (herniated nucleus pulposus), lumbar 07/24/2015  . CHF with left ventricular diastolic dysfunction, NYHA class 2 ([place]) 01/28/2014  . PMR (polymyalgia rheumatica) ([place]) 09/09/2013  . Rheumatoid arthritis ([place]) 04/13/2012  . CAD (coronary artery disease) 12/19/2011  . Insulin dependent diabetes mellitus ([place]) 12/19/2011  . Neuropathy ([place]) 12/19/2011    Jacob [name], PT 01/31/18 2:40 PM  [facility] [address] [place], [place], [address] Phone: [phone]   Fax:  [phone]  Name: CAMDON SAETERN MRN: [id number] Date of Birth: [birth year]

## 2018-02-02 ENCOUNTER — Ambulatory Visit: Payer: No Typology Code available for payment source

## 2018-02-02 DIAGNOSIS — M545 Low back pain, unspecified: Secondary | ICD-10-CM

## 2018-02-02 DIAGNOSIS — R2681 Unsteadiness on feet: Secondary | ICD-10-CM

## 2018-02-02 DIAGNOSIS — G8929 Other chronic pain: Secondary | ICD-10-CM

## 2018-02-02 DIAGNOSIS — R2689 Other abnormalities of gait and mobility: Secondary | ICD-10-CM

## 2018-02-02 DIAGNOSIS — M6281 Muscle weakness (generalized): Secondary | ICD-10-CM | POA: Diagnosis not present

## 2018-02-02 NOTE — Therapy (Signed)
[facility] [address], [place], [place], [address] Phone: [phone]   Fax:  [phone]  Physical Therapy Treatment  Patient Details  Name: Jacob [name] MRN: [id number] Date of Birth: [birth year] Referring Provider (PT): Annie Sable, MD   Encounter Date: 02/02/2018  PT End of Session - 02/02/18 1439    Visit Number  9    Date for PT Re-Evaluation  02/28/18    Authorization Type  VA    Authorization Time Period  12/28/17 to 03/22/17 (per VA approval) - 15 visit limit    Authorization - Visit Number  9    Authorization - Number of Visits  15    PT Start Time  6269    PT Stop Time  1439    PT Time Calculation (min)  40 min    Activity Tolerance  Patient tolerated treatment well    Behavior During Therapy  [facility] for tasks assessed/performed       Past Medical History:  Diagnosis Date  . Allergy   . Anemia   . Anxiety   . Arthritis   . Asthma   . BPH (benign prostatic hyperplasia)   . Cancer of kidney ([place])   . Cataract   . CHF (congestive heart failure) ([place])   . Chronic kidney disease    chronic  kidney failure  kidney function at 42%  . COPD (chronic obstructive pulmonary disease) ([place])   . Coronary artery disease    CABG  7 bypasses  . Diabetes mellitus without complication ([place])   . Hepatitis    many years ago  . Hypertension   . Polymyalgia rheumatica (HCC)    maintained on Prednisone, Plaquenil. Followed by rhuematology every 4 months/[name].  . Shortness of breath dyspnea    with exertion  . Sleep apnea    CPAP   Trying to use    Past Surgical History:  Procedure Laterality Date  . APPENDECTOMY    . CATARACT EXTRACTION W/PHACO Right 11/28/2014   Procedure: CATARACT EXTRACTION PHACO AND INTRAOCULAR LENS PLACEMENT ([place]) RIGHT ;  Surgeon: Marylynn Pearson, MD;  Location: [facility];  Service: Ophthalmology;  Laterality: Right;  . CORONARY ARTERY BYPASS GRAFT  03/17/1995   Wynonia Lawman; followed every six  months.  Marland Kitchen HERNIA REPAIR    . LUMBAR LAMINECTOMY/DECOMPRESSION MICRODISCECTOMY N/A 07/30/2015   Procedure: LUMBAR LAMINECTOMY DISCECTOMY ;  Surgeon: Ashok Pall, MD;  Location: [place] [facility];  Service: Neurosurgery;  Laterality: N/A;  LUMBAR LAMINECTOMY DISCECTOMY   . LUMBAR LAMINECTOMY/DECOMPRESSION MICRODISCECTOMY N/A 09/06/2015   Procedure: Redo L3/4 Disectomy;  Surgeon: Ashok Pall, MD;  Location: [place] [facility];  Service: Neurosurgery;  Laterality: N/A;  . PROSTATE SURGERY     TURP at [place].    There were no vitals filed for this visit.  Subjective Assessment - 02/02/18 1403    Subjective  My legs feel really weak today.  I am not sure why.  I didn't sleep well last night.      Currently in Pain?  Yes    Pain Score  --   up to 8/10 with standing   Pain Location  Back    Pain Orientation  Right    Pain Descriptors / Indicators  Aching    Pain Type  Chronic pain    Pain Onset  More than a month ago    Pain Frequency  Intermittent    Aggravating Factors   pain only when standing    Pain Relieving  Factors  sitting                       [facility] Adult PT Treatment/Exercise - 02/02/18 0001      Transfers   Five time sit to stand comments   16.35 seconds, UE support       Lumbar Exercises: Standing   Other Standing Lumbar Exercises  standing on black foam pad for core activation 3x30 seconds      Lumbar Exercises: Seated   Other Seated Lumbar Exercises  press down on foam roll with core activation x 10    Other Seated Lumbar Exercises  diagnonals with red ball seated with abdominal bracing 2x10 bil each      Knee/Hip Exercises: Stretches   Active Hamstring Stretch  Both;3 reps;20 seconds      Knee/Hip Exercises: Aerobic   Nustep  L3 x 10 min   PT present to discuss progress and monitor      Knee/Hip Exercises: Standing   Heel Raises  Both;2 sets;10 reps    Hip Abduction  Stengthening;Both;2 sets;10 reps    Abduction Limitations  2#    Hip Extension   Stengthening;Both;2 sets;10 reps    Extension Limitations  2#      Knee/Hip Exercises: Seated   Long Arc Quad  Strengthening;Both;2 sets;10 reps    Long Arc Quad Weight  2 lbs.    Ball Squeeze  x20     Marching  Strengthening;Both;2 sets;10 reps    [employer]  2 lbs.    Abduction/Adduction   Strengthening;Both;2 sets;10 reps;Limitations    Abd/Adduction Limitations  red band               PT Short Term Goals - 02/02/18 1407      PT SHORT TERM GOAL #1   Title  Pt will be independent with his initial HEP to improve flexibility, strength and balance.     Status  Achieved      PT SHORT TERM GOAL #2   Title  Pt will report walking for atleast 5 min, 3x/week to improve his endurance activity participation.     Status  Achieved      PT SHORT TERM GOAL #3   Title  report  < or = to 6/10 LBP with standing and walking    Baseline  8/10    Time  4    Period  Weeks    Status  On-going        PT Long Term Goals - 02/02/18 1407      PT LONG TERM GOAL #2   Title  Pt will demo improved functional strength and power of the LEs evident by his ability to complete 5x/sit to stand in less than 14 sec with minimal UE use.     Baseline  16.35 seconds    Time  8    Period  Weeks    Status  On-going      PT LONG TERM GOAL #5   Title  report < or  = to 5/10 LBP with standing and walking    Baseline  up to 8/10- this varies    Time  8    Period  Weeks    Status  On-going            Plan - 02/02/18 1419    Clinical Impression Statement  Pt reports bil LE weakness today and is not sure of the cause.  Pt is exercising regularly  at home and is walking up and down the hall at home. Pt is limited with standing due to 8/10 LBP.  Pt requires close stand by assistance with standing exercise for safety due to weakness and pain.  Pt with improved time with 5x sit to stand with use of his arms indicating improved balance.  Pt will continue to benefit from skilled PT for strength,  endurance and balance.      Rehab Potential  Good    PT Frequency  2x / week    PT Duration  8 weeks    PT Treatment/Interventions  ADLs/Self Care Home Management;Moist Heat;Traction;Electrical Stimulation;Cryotherapy;Functional mobility training;Therapeutic activities;Therapeutic exercise;Patient/family education;Manual techniques;Passive range of motion;Neuromuscular re-education;Balance training;Taping;Spinal Manipulations;Dry needling    PT Next Visit Plan  strength, endurance, flexibility and balance exercises.  Incorporate core strength into exercise.  Test Berg to address LTG-didn't test today due to leg weakness and LBP    PT Home Exercise Plan   Access Code: [email or web address]     Consulted and Agree with Plan of Care  Patient       Patient will benefit from skilled therapeutic intervention in order to improve the following deficits and impairments:  Abnormal gait, Decreased activity tolerance, Decreased safety awareness, Decreased strength, Impaired flexibility, Postural dysfunction, Pain, Obesity, Improper body mechanics, Decreased range of motion, Decreased endurance, Decreased balance, Decreased mobility, Difficulty walking, Hypomobility, Increased muscle spasms  Visit Diagnosis: Muscle weakness (generalized)  Unsteadiness on feet  Chronic low back pain, unspecified back pain laterality, unspecified whether sciatica present  Other abnormalities of gait and mobility     Problem List Patient Active Problem List   Diagnosis Date Noted  . Chronic kidney disease (CKD), stage IV (severe) ([place]) 11/27/2015  . Acute on chronic diastolic CHF (congestive heart failure) ([place]) 11/27/2015  . Normocytic anemia 11/27/2015  . AKI (acute kidney injury) ([place])   . Acute kidney injury superimposed on chronic kidney disease ([place]) 10/02/2015  . Atrial fibrillation with RVR ([place]) 10/02/2015  . Hypotension 10/02/2015  . Anorexia   . Adjustment disorder with mixed emotional features 09/01/2015  . Acute  encephalopathy 08/29/2015  . Elevated troponin 08/29/2015  . Renal cancer ([place]) 08/28/2015  . Spinal stenosis of lumbar region with radiculopathy 08/07/2015  . COPD (chronic obstructive pulmonary disease) ([employer])   . OSA (obstructive sleep apnea) 07/27/2015  . HNP (herniated nucleus pulposus), lumbar 07/24/2015  . CHF with left ventricular diastolic dysfunction, NYHA class 2 ([place]) 01/28/2014  . PMR (polymyalgia rheumatica) ([place]) 09/09/2013  . Rheumatoid arthritis ([place]) 04/13/2012  . CAD (coronary artery disease) 12/19/2011  . Insulin dependent diabetes mellitus ([place]) 12/19/2011  . Neuropathy ([place]) 12/19/2011   Sigurd Sos, PT 02/02/18 2:41 PM  [facility] [address], [place], [place], [address] Phone: [phone]   Fax:  [phone]  Name: Jacob [name] MRN: [id number] Date of Birth: [birth year]

## 2018-02-09 ENCOUNTER — Ambulatory Visit: Payer: No Typology Code available for payment source

## 2018-02-09 DIAGNOSIS — R2689 Other abnormalities of gait and mobility: Secondary | ICD-10-CM

## 2018-02-09 DIAGNOSIS — M545 Low back pain, unspecified: Secondary | ICD-10-CM

## 2018-02-09 DIAGNOSIS — R2681 Unsteadiness on feet: Secondary | ICD-10-CM

## 2018-02-09 DIAGNOSIS — M6281 Muscle weakness (generalized): Secondary | ICD-10-CM

## 2018-02-09 DIAGNOSIS — G8929 Other chronic pain: Secondary | ICD-10-CM

## 2018-02-09 NOTE — Therapy (Signed)
[facility] [address], [place], [place], [address] Phone: [phone]   Fax:  [phone]  Physical Therapy Treatment  Patient Details  Name: Jacob [name] MRN: [id number] Date of Birth: June 30, 1940 Referring Provider (PT): Annie Sable, MD   Encounter Date: 02/09/2018 Progress Note Reporting Period 12/28/17 to 02/09/18  See note below for Objective Data and Assessment of Progress/Goals.      PT End of Session - 02/09/18 1310    Visit Number  10    Date for PT Re-Evaluation  02/28/18    Authorization - Visit Number  10    Authorization - Number of Visits  15    PT Start Time  1225    PT Stop Time  1309    PT Time Calculation (min)  44 min    Activity Tolerance  Patient tolerated treatment well    Behavior During Therapy  WFL for tasks assessed/performed       Past Medical History:  Diagnosis Date  . Allergy   . Anemia   . Anxiety   . Arthritis   . Asthma   . BPH (benign prostatic hyperplasia)   . Cancer of kidney ([place])   . Cataract   . CHF (congestive heart failure) ([place])   . Chronic kidney disease    chronic  kidney failure  kidney function at 42%  . COPD (chronic obstructive pulmonary disease) ([place])   . Coronary artery disease    CABG  7 bypasses  . Diabetes mellitus without complication ([place])   . Hepatitis    many years ago  . Hypertension   . Polymyalgia rheumatica (HCC)    maintained on Prednisone, Plaquenil. Followed by rhuematology every 4 months/[name].  . Shortness of breath dyspnea    with exertion  . Sleep apnea    CPAP   Trying to use    Past Surgical History:  Procedure Laterality Date  . APPENDECTOMY    . CATARACT EXTRACTION W/PHACO Right 11/28/2014   Procedure: CATARACT EXTRACTION PHACO AND INTRAOCULAR LENS PLACEMENT ([place]) RIGHT ;  Surgeon: Marylynn Pearson, MD;  Location: [place];  Service: Ophthalmology;  Laterality: Right;  . CORONARY ARTERY BYPASS GRAFT  03/17/1995    Wynonia Lawman; followed every six months.  Marland Kitchen HERNIA REPAIR    . LUMBAR LAMINECTOMY/DECOMPRESSION MICRODISCECTOMY N/A 07/30/2015   Procedure: LUMBAR LAMINECTOMY DISCECTOMY ;  Surgeon: Ashok Pall, MD;  Location: [place] [facility];  Service: Neurosurgery;  Laterality: N/A;  LUMBAR LAMINECTOMY DISCECTOMY   . LUMBAR LAMINECTOMY/DECOMPRESSION MICRODISCECTOMY N/A 09/06/2015   Procedure: Redo L3/4 Disectomy;  Surgeon: Ashok Pall, MD;  Location: [place] [facility];  Service: Neurosurgery;  Laterality: N/A;  . PROSTATE SURGERY     TURP at [place].    There were no vitals filed for this visit.  Subjective Assessment - 02/09/18 1235    Subjective  I have been riding my new bike each day.  2 miles     Pertinent History  CABG, lumbar lamiectomy and microdiscectomy, DM with neuropathy    Currently in Pain?  No/denies         [facility] PT Assessment - 02/09/18 0001      Assessment   Medical Diagnosis  other abnormilites of gait and mobility    Referring Provider (PT)  Annie Sable, MD    Onset Date/Surgical Date  --   2017     Strength   Right Hip Flexion  4/5   tested in sitting   Left Hip Flexion  4/5   tested in sitting   Right Knee Extension  4+/5    Left Knee Extension  4+/5      Transfers   Five time sit to stand comments   14.18, UE support                    [facility] Adult PT Treatment/Exercise - 02/09/18 0001      Lumbar Exercises: Standing   Other Standing Lumbar Exercises  standing on black foam pad for core activation 3x30 seconds      Lumbar Exercises: Seated   Other Seated Lumbar Exercises  press down on foam roll with core activation x 10    Other Seated Lumbar Exercises  diagnonals with red ball seated with abdominal bracing 2x10 bil each      Knee/Hip Exercises: Stretches   Active Hamstring Stretch  Both;3 reps;20 seconds      Knee/Hip Exercises: Aerobic   Nustep  L3 x 10 min   PT present to discuss progress and monitor      Knee/Hip Exercises: Standing   Heel Raises   Both;2 sets;10 reps    Hip Abduction  Stengthening;Both;2 sets;10 reps    Abduction Limitations  2#    Hip Extension  Stengthening;Both;2 sets;10 reps    Extension Limitations  2#      Knee/Hip Exercises: Seated   Long Arc Quad  Strengthening;Both;2 sets;10 reps    Long Arc Quad Weight  2 lbs.    Ball Squeeze  x20     Marching  Strengthening;Both;2 sets;10 reps    [employer]  2 lbs.    Abduction/Adduction   Strengthening;Both;2 sets;10 reps;Limitations    Abd/Adduction Limitations  red band               PT Short Term Goals - 02/09/18 1243      PT SHORT TERM GOAL #1   Title  Pt will be independent with his initial HEP to improve flexibility, strength and balance.     Status  Achieved      PT SHORT TERM GOAL #2   Title  Pt will report walking for atleast 5 min, 3x/week to improve his endurance activity participation.     Status  Achieved      PT SHORT TERM GOAL #3   Title  report  < or = to 6/10 LBP with standing and walking    Baseline  8/10- pt with chronic LBP     Status  Deferred        PT Long Term Goals - 02/09/18 1244      PT LONG TERM GOAL #1   Title  Pt will demo independence with his advanced HEP to futher promote strenght gains after d/c from PT.     Time  8    Period  Weeks    Status  On-going      PT LONG TERM GOAL #2   Title  Pt will demo improved functional strength and power of the LEs evident by his ability to complete 5x/sit to stand in less than 14 sec with minimal UE use.     Baseline  14.18 seconds with min UE support    Time  8    Period  Weeks    Status  On-going      PT LONG TERM GOAL #4   Title  Pt will have atleast 6 points increase in his BERG balance score which will indicate a significant improvement in his  balance and decrease fall risk.     Time  8    Period  Weeks    Status  On-going      PT LONG TERM GOAL #5   Title  report < or  = to 5/10 LBP with standing and walking    Status  Deferred            Plan  - 02/09/18 1254    Clinical Impression Statement  Pt is making slow but steady gains regarding LE strength, endurance and balance.  Pt has chronic condition and complex lumbar surgical history so slow progress is expected.  Pt with improved time with 5x sit to stand with min use of UEs indicating improved overall balance.  Pt is walking consistently at home and reports this is easier to go long distances.  Pt is limited to standing long distances due to LBP.  Pt requires stand by assistance with standing exercises for safety and to monitor for LBP.  Pt will continue to benefit from skilled PT for LE strength, balance and stability to improve safety at home and in the community.      Rehab Potential  Good    PT Frequency  2x / week    PT Duration  8 weeks    PT Treatment/Interventions  ADLs/Self Care Home Management;Moist Heat;Traction;Electrical Stimulation;Cryotherapy;Functional mobility training;Therapeutic activities;Therapeutic exercise;Patient/family education;Manual techniques;Passive range of motion;Neuromuscular re-education;Balance training;Taping;Spinal Manipulations;Dry needling    PT Next Visit Plan  strength, endurance, flexibility and balance exercises.  Incorporate core strength into exercise.  Test Berg.      PT Home Exercise Plan   Access Code: [email or web address]     Recommended Other Services  initial certification is signed    Consulted and Agree with Plan of Care  Patient       Patient will benefit from skilled therapeutic intervention in order to improve the following deficits and impairments:  Abnormal gait, Decreased activity tolerance, Decreased safety awareness, Decreased strength, Impaired flexibility, Postural dysfunction, Pain, Obesity, Improper body mechanics, Decreased range of motion, Decreased endurance, Decreased balance, Decreased mobility, Difficulty walking, Hypomobility, Increased muscle spasms  Visit Diagnosis: Muscle weakness (generalized)  Unsteadiness on  feet  Chronic low back pain, unspecified back pain laterality, unspecified whether sciatica present  Other abnormalities of gait and mobility     Problem List Patient Active Problem List   Diagnosis Date Noted  . Chronic kidney disease (CKD), stage IV (severe) ([place]) 11/27/2015  . Acute on chronic diastolic CHF (congestive heart failure) ([place]) 11/27/2015  . Normocytic anemia 11/27/2015  . AKI (acute kidney injury) ([place])   . Acute kidney injury superimposed on chronic kidney disease ([place]) 10/02/2015  . Atrial fibrillation with RVR ([place]) 10/02/2015  . Hypotension 10/02/2015  . Anorexia   . Adjustment disorder with mixed emotional features 09/01/2015  . Acute encephalopathy 08/29/2015  . Elevated troponin 08/29/2015  . Renal cancer ([place]) 08/28/2015  . Spinal stenosis of lumbar region with radiculopathy 08/07/2015  . COPD (chronic obstructive pulmonary disease) ([place])   . OSA (obstructive sleep apnea) 07/27/2015  . HNP (herniated nucleus pulposus), lumbar 07/24/2015  . CHF with left ventricular diastolic dysfunction, NYHA class 2 ([place]) 01/28/2014  . PMR (polymyalgia rheumatica) ([place]) 09/09/2013  . Rheumatoid arthritis ([place]) 04/13/2012  . CAD (coronary artery disease) 12/19/2011  . Insulin dependent diabetes mellitus ([place]) 12/19/2011  . Neuropathy ([place]) 12/19/2011   Sigurd Sos, PT 02/09/18 1:13 PM  [facility] [address] [place],  [place], [address] Phone: [phone]   Fax:  [phone]  Name: Jacob [name] MRN: [id number] Date of Birth: [birth year]

## 2018-02-16 ENCOUNTER — Ambulatory Visit: Payer: No Typology Code available for payment source | Attending: Internal Medicine

## 2018-02-16 DIAGNOSIS — M6281 Muscle weakness (generalized): Secondary | ICD-10-CM | POA: Insufficient documentation

## 2018-02-16 DIAGNOSIS — M545 Low back pain, unspecified: Secondary | ICD-10-CM

## 2018-02-16 DIAGNOSIS — R2689 Other abnormalities of gait and mobility: Secondary | ICD-10-CM | POA: Insufficient documentation

## 2018-02-16 DIAGNOSIS — R2681 Unsteadiness on feet: Secondary | ICD-10-CM | POA: Insufficient documentation

## 2018-02-16 DIAGNOSIS — G8929 Other chronic pain: Secondary | ICD-10-CM | POA: Diagnosis present

## 2018-02-16 NOTE — Therapy (Signed)
[facility] [address], [place], [place], [address] Phone: [phone]   Fax:  [phone]  Physical Therapy Treatment  Patient Details  Name: Jacob [name] MRN: [id number] Date of Birth: 1940/06/24 Referring Provider (PT): Annie Sable, MD   Encounter Date: 02/16/2018  PT End of Session - 02/16/18 1433    Visit Number  11    Date for PT Re-Evaluation  02/28/18    Authorization Type  [place]    Authorization Time Period  12/28/17 to 03/22/17 (per [facility] approval) - 15 visit limit    Authorization - Visit Number  11    Authorization - Number of Visits  15    PT Start Time  5701    PT Stop Time  1444    PT Time Calculation (min)  42 min    Activity Tolerance  Patient tolerated treatment well    Behavior During Therapy  [facility] for tasks assessed/performed       Past Medical History:  Diagnosis Date  . Allergy   . Anemia   . Anxiety   . Arthritis   . Asthma   . BPH (benign prostatic hyperplasia)   . Cancer of kidney ([place])   . Cataract   . CHF (congestive heart failure) ([place])   . Chronic kidney disease    chronic  kidney failure  kidney function at 42%  . COPD (chronic obstructive pulmonary disease) ([place])   . Coronary artery disease    CABG  7 bypasses  . Diabetes mellitus without complication ([place])   . Hepatitis    many years ago  . Hypertension   . Polymyalgia rheumatica (HCC)    maintained on Prednisone, Plaquenil. Followed by rhuematology every 4 months/[name].  . Shortness of breath dyspnea    with exertion  . Sleep apnea    CPAP   Trying to use    Past Surgical History:  Procedure Laterality Date  . APPENDECTOMY    . CATARACT EXTRACTION W/PHACO Right 11/28/2014   Procedure: CATARACT EXTRACTION PHACO AND INTRAOCULAR LENS PLACEMENT ([place]) RIGHT ;  Surgeon: Marylynn Pearson, MD;  Location: [facility];  Service: Ophthalmology;  Laterality: Right;  . CORONARY ARTERY BYPASS GRAFT  03/17/1995   Wynonia Lawman; followed every six  months.  Marland Kitchen HERNIA REPAIR    . LUMBAR LAMINECTOMY/DECOMPRESSION MICRODISCECTOMY N/A 07/30/2015   Procedure: LUMBAR LAMINECTOMY DISCECTOMY ;  Surgeon: Ashok Pall, MD;  Location: [place] [facility];  Service: Neurosurgery;  Laterality: N/A;  LUMBAR LAMINECTOMY DISCECTOMY   . LUMBAR LAMINECTOMY/DECOMPRESSION MICRODISCECTOMY N/A 09/06/2015   Procedure: Redo L3/4 Disectomy;  Surgeon: Ashok Pall, MD;  Location: [place] [facility];  Service: Neurosurgery;  Laterality: N/A;  . PROSTATE SURGERY     TURP at [place].    There were no vitals filed for this visit.  Subjective Assessment - 02/16/18 1358    Subjective  I've been riding my bike 2-3 miles a day.    Pertinent History  CABG, lumbar lamiectomy and microdiscectomy, DM with neuropathy    Currently in Pain?  No/denies         [facility] PT Assessment - 02/16/18 0001      Berg Balance Test   Sit to Stand  Able to stand  independently using hands    Standing Unsupported  Able to stand safely 2 minutes    Sitting with Back Unsupported but Feet Supported on Floor or Stool  Able to sit safely and securely 2 minutes    Stand to Sit  Controls descent by using hands    Transfers  Able to transfer safely, definite need of hands    Standing Unsupported with Eyes Closed  Able to stand 10 seconds with supervision    Standing Ubsupported with Feet Together  Able to place feet together independently and stand for 1 minute with supervision    From Standing, Reach Forward with Outstretched Arm  Can reach forward >5 cm safely (2")    From Standing Position, Pick up Object from [place] to pick up shoe, needs supervision    From Standing Position, Turn to Look Behind Over each Shoulder  Needs supervision when turning    Turn 360 Degrees  Able to turn 360 degrees safely but slowly    Standing Unsupported, Alternately Place Feet on Step/Stool  Able to complete >2 steps/needs minimal assist    Standing Unsupported, One Foot in Front  Able to take small step independently and  hold 30 seconds    Standing on One Leg  Tries to lift leg/unable to hold 3 seconds but remains standing independently    Total Score  35                   [facility] Adult PT Treatment/Exercise - 02/16/18 0001      Lumbar Exercises: Seated   Other Seated Lumbar Exercises  press down on foam roll with core activation x 10      Knee/Hip Exercises: Stretches   Active Hamstring Stretch  Both;3 reps;20 seconds      Knee/Hip Exercises: Aerobic   Nustep  L3 x 10 min   PT present to discuss progress and monitor      Knee/Hip Exercises: Standing   Heel Raises  Both;2 sets;10 reps    Hip Abduction  Stengthening;Both;2 sets;10 reps    Abduction Limitations  2#    Hip Extension  Stengthening;Both;2 sets;10 reps    Extension Limitations  2#      Knee/Hip Exercises: Seated   Long Arc Quad  Strengthening;Both;2 sets;10 reps    Long Arc Quad Weight  2 lbs.    Marching  Strengthening;Both;2 sets;10 reps    Marching Weights  2 lbs.               PT Short Term Goals - 02/09/18 1243      PT SHORT TERM GOAL #1   Title  Pt will be independent with his initial HEP to improve flexibility, strength and balance.     Status  Achieved      PT SHORT TERM GOAL #2   Title  Pt will report walking for atleast 5 min, 3x/week to improve his endurance activity participation.     Status  Achieved      PT SHORT TERM GOAL #3   Title  report  < or = to 6/10 LBP with standing and walking    Baseline  8/10- pt with chronic LBP     Status  Deferred        PT Long Term Goals - 02/16/18 1414      PT LONG TERM GOAL #4   Title  Pt will have atleast 6 points increase in his BERG balance score which will indicate a significant improvement in his balance and decrease fall risk.     Baseline  35/56    Time  --    Period  --    Status  Achieved            Plan -  02/16/18 1418    Clinical Impression Statement  Pt is making slow but steady gains regarding LE strength, endurance and  balance.  Pt has chronic condition and complex lumbar surgical history so slow progress is expected. Pt with improved Berg Score today (35/56) indicating improved overall balance.  Pt is using his floor cycle 2x/day for a total of 2-3 miles each day.   Pt is limited to standing long distances due to LBP.  Pt requires stand by assistance with standing exercises for safety and to monitor for LBP during Berg Balance test and standing exercise.  Pt will continue to benefit from skilled PT for LE strength, balance and stability to improve safety at home and in the community.       Rehab Potential  Good    PT Frequency  2x / week    PT Duration  8 weeks    PT Treatment/Interventions  ADLs/Self Care Home Management;Moist Heat;Traction;Electrical Stimulation;Cryotherapy;Functional mobility training;Therapeutic activities;Therapeutic exercise;Patient/family education;Manual techniques;Passive range of motion;Neuromuscular re-education;Balance training;Taping;Spinal Manipulations;Dry needling    PT Next Visit Plan  strength, endurance, flexibility and balance exercises.  Incorporate core strength into exercise.      PT Home Exercise Plan   Access Code: [email or web address]     Consulted and Agree with Plan of Care  Patient       Patient will benefit from skilled therapeutic intervention in order to improve the following deficits and impairments:  Abnormal gait, Decreased activity tolerance, Decreased safety awareness, Decreased strength, Impaired flexibility, Postural dysfunction, Pain, Obesity, Improper body mechanics, Decreased range of motion, Decreased endurance, Decreased balance, Decreased mobility, Difficulty walking, Hypomobility, Increased muscle spasms  Visit Diagnosis: Muscle weakness (generalized)  Unsteadiness on feet  Chronic low back pain, unspecified back pain laterality, unspecified whether sciatica present  Other abnormalities of gait and mobility     Problem List Patient Active Problem List    Diagnosis Date Noted  . Chronic kidney disease (CKD), stage IV (severe) ([place]) 11/27/2015  . Acute on chronic diastolic CHF (congestive heart failure) ([place]) 11/27/2015  . Normocytic anemia 11/27/2015  . AKI (acute kidney injury) ([place])   . Acute kidney injury superimposed on chronic kidney disease ([place]) 10/02/2015  . Atrial fibrillation with RVR ([place]) 10/02/2015  . Hypotension 10/02/2015  . Anorexia   . Adjustment disorder with mixed emotional features 09/01/2015  . Acute encephalopathy 08/29/2015  . Elevated troponin 08/29/2015  . Renal cancer ([place]) 08/28/2015  . Spinal stenosis of lumbar region with radiculopathy 08/07/2015  . COPD (chronic obstructive pulmonary disease) ([place])   . OSA (obstructive sleep apnea) 07/27/2015  . HNP (herniated nucleus pulposus), lumbar 07/24/2015  . CHF with left ventricular diastolic dysfunction, NYHA class 2 ([place]) 01/28/2014  . PMR (polymyalgia rheumatica) ([place]) 09/09/2013  . Rheumatoid arthritis ([place]) 04/13/2012  . CAD (coronary artery disease) 12/19/2011  . Insulin dependent diabetes mellitus ([place]) 12/19/2011  . Neuropathy ([place]) 12/19/2011    Jacob [name], PT 02/16/18 2:38 PM  [facility] [address], [place], [place], [address] Phone: [phone]   Fax:  [phone]  Name: Jacob [name] MRN: [id number] Date of Birth: February 27, 1941

## 2018-02-17 ENCOUNTER — Encounter: Payer: Self-pay | Admitting: Physical Therapy

## 2018-02-17 ENCOUNTER — Ambulatory Visit: Payer: No Typology Code available for payment source | Admitting: Physical Therapy

## 2018-02-17 DIAGNOSIS — M545 Low back pain, unspecified: Secondary | ICD-10-CM

## 2018-02-17 DIAGNOSIS — R2681 Unsteadiness on feet: Secondary | ICD-10-CM

## 2018-02-17 DIAGNOSIS — M6281 Muscle weakness (generalized): Secondary | ICD-10-CM | POA: Diagnosis not present

## 2018-02-17 DIAGNOSIS — G8929 Other chronic pain: Secondary | ICD-10-CM

## 2018-02-17 DIAGNOSIS — R2689 Other abnormalities of gait and mobility: Secondary | ICD-10-CM

## 2018-02-17 NOTE — Therapy (Signed)
[facility] [address] [place], [place], [address] Phone: [phone]   Fax:  [phone]  Physical Therapy Treatment  Patient Details  Name: Jacob [name] MRN: [id number] Date of Birth: 1940/06/14 Referring Provider (PT): Annie Sable, MD   Encounter Date: 02/17/2018  PT End of Session - 02/17/18 1638    Visit Number  12    Date for PT Re-Evaluation  02/28/18    Authorization Type  [place]    Authorization Time Period  12/28/17 to 03/22/17 (per VA approval) - 15 visit limit    Authorization - Visit Number  12    Authorization - Number of Visits  15    PT Start Time  6734    PT Stop Time  1937   ended early due to pt not feeling well    PT Time Calculation (min)  34 min    Activity Tolerance  Patient tolerated treatment well;No increased pain    Behavior During Therapy  WFL for tasks assessed/performed       Past Medical History:  Diagnosis Date  . Allergy   . Anemia   . Anxiety   . Arthritis   . Asthma   . BPH (benign prostatic hyperplasia)   . Cancer of kidney ([place])   . Cataract   . CHF (congestive heart failure) ([place])   . Chronic kidney disease    chronic  kidney failure  kidney function at 42%  . COPD (chronic obstructive pulmonary disease) ([place])   . Coronary artery disease    CABG  7 bypasses  . Diabetes mellitus without complication ([place])   . Hepatitis    many years ago  . Hypertension   . Polymyalgia rheumatica (HCC)    maintained on Prednisone, Plaquenil. Followed by rhuematology every 4 months/[name].  . Shortness of breath dyspnea    with exertion  . Sleep apnea    CPAP   Trying to use    Past Surgical History:  Procedure Laterality Date  . APPENDECTOMY    . CATARACT EXTRACTION W/PHACO Right 11/28/2014   Procedure: CATARACT EXTRACTION PHACO AND INTRAOCULAR LENS PLACEMENT ([place]) RIGHT ;  Surgeon: Marylynn Pearson, MD;  Location: [facility];  Service: Ophthalmology;  Laterality: Right;  . CORONARY  ARTERY BYPASS GRAFT  03/17/1995   Wynonia Lawman; followed every six months.  Marland Kitchen HERNIA REPAIR    . LUMBAR LAMINECTOMY/DECOMPRESSION MICRODISCECTOMY N/A 07/30/2015   Procedure: LUMBAR LAMINECTOMY DISCECTOMY ;  Surgeon: Ashok Pall, MD;  Location: [facility];  Service: Neurosurgery;  Laterality: N/A;  LUMBAR LAMINECTOMY DISCECTOMY   . LUMBAR LAMINECTOMY/DECOMPRESSION MICRODISCECTOMY N/A 09/06/2015   Procedure: Redo L3/4 Disectomy;  Surgeon: Ashok Pall, MD;  Location: [facility];  Service: Neurosurgery;  Laterality: N/A;  . PROSTATE SURGERY     TURP at [place].    There were no vitals filed for this visit.  Subjective Assessment - 02/17/18 1402    Subjective  Pt reports that he is having a back MRI coming up end of this month. Still has back pain when up doing alot during the day.     Pertinent History  CABG, lumbar lamiectomy and microdiscectomy, DM with neuropathy    Currently in Pain?  No/denies                       [facility] Adult PT Treatment/Exercise - 02/17/18 0001      Knee/Hip Exercises: Aerobic   Nustep  L3 x5 min, PT present  to discuss progress       Knee/Hip Exercises: Standing   Heel Raises  Both;1 set;20 reps    Hip Abduction  Stengthening;Right;Left;2 sets;10 reps    Abduction Limitations  side step/back with yellow TB around feet     Hip Extension  Stengthening;Both;2 sets;10 reps    Extension Limitations  yellow TB around ankles, step back       Knee/Hip Exercises: Seated   Long Arc Quad  Strengthening;Both;2 sets;10 reps    Long Arc Quad Weight  5 lbs.    Marching  Strengthening;Both;2 sets;10 reps    Marching Weights  5 lbs.             PT Education - 02/17/18 1637    Education Details  technique with therex; updated HEP    Person(s) Educated  Patient    Methods  Explanation;Handout    Comprehension  Returned demonstration;Verbalized understanding       PT Short Term Goals - 02/09/18 1243      PT SHORT TERM GOAL #1   Title  Pt will be  independent with his initial HEP to improve flexibility, strength and balance.     Status  Achieved      PT SHORT TERM GOAL #2   Title  Pt will report walking for atleast 5 min, 3x/week to improve his endurance activity participation.     Status  Achieved      PT SHORT TERM GOAL #3   Title  report  < or = to 6/10 LBP with standing and walking    Baseline  8/10- pt with chronic LBP     Status  Deferred        PT Long Term Goals - 02/16/18 1414      PT LONG TERM GOAL #4   Title  Pt will have atleast 6 points increase in his BERG balance score which will indicate a significant improvement in his balance and decrease fall risk.     Baseline  35/56    Time  --    Period  --    Status  Achieved            Plan - 02/17/18 1703    Clinical Impression Statement  Today's session focused primarily on therex to promote LE strength and endurance. Pt was able to complete therex with increase in resistance compared to previous sessions, without reported increase in low back pain. He did have intermittent rest breaks due to increases in resistance and number of standing exercises. Therapist had intentions of completing balance activity, however pt's blood sugar dropped and he required a rest break and recovery end of session. Due to this, session ended early and his HEP update was provided to him. Pt felt good leaving the clinic and reported understanding of HEP updates.     Rehab Potential  Good    PT Frequency  2x / week    PT Duration  8 weeks    PT Treatment/Interventions  ADLs/Self Care Home Management;Moist Heat;Traction;Electrical Stimulation;Cryotherapy;Functional mobility training;Therapeutic activities;Therapeutic exercise;Patient/family education;Manual techniques;Passive range of motion;Neuromuscular re-education;Balance training;Taping;Spinal Manipulations;Dry needling    PT Next Visit Plan  strength progression, balance activity; flexibility exercises.  Incorporate core strength  into exercise.      PT Home Exercise Plan   Access Code: [email or web address]     Consulted and Agree with Plan of Care  Patient       Patient will benefit from skilled therapeutic intervention in order to improve the following  deficits and impairments:  Abnormal gait, Decreased activity tolerance, Decreased safety awareness, Decreased strength, Impaired flexibility, Postural dysfunction, Pain, Obesity, Improper body mechanics, Decreased range of motion, Decreased endurance, Decreased balance, Decreased mobility, Difficulty walking, Hypomobility, Increased muscle spasms  Visit Diagnosis: Muscle weakness (generalized)  Unsteadiness on feet  Chronic low back pain, unspecified back pain laterality, unspecified whether sciatica present  Other abnormalities of gait and mobility     Problem List Patient Active Problem List   Diagnosis Date Noted  . Chronic kidney disease (CKD), stage IV (severe) ([place]) 11/27/2015  . Acute on chronic diastolic CHF (congestive heart failure) ([place]) 11/27/2015  . Normocytic anemia 11/27/2015  . AKI (acute kidney injury) ([place])   . Acute kidney injury superimposed on chronic kidney disease ([place]) 10/02/2015  . Atrial fibrillation with RVR ([place]) 10/02/2015  . Hypotension 10/02/2015  . Anorexia   . Adjustment disorder with mixed emotional features 09/01/2015  . Acute encephalopathy 08/29/2015  . Elevated troponin 08/29/2015  . Renal cancer ([place]) 08/28/2015  . Spinal stenosis of lumbar region with radiculopathy 08/07/2015  . COPD (chronic obstructive pulmonary disease) ([place])   . OSA (obstructive sleep apnea) 07/27/2015  . HNP (herniated nucleus pulposus), lumbar 07/24/2015  . CHF with left ventricular diastolic dysfunction, NYHA class 2 ([place]) 01/28/2014  . PMR (polymyalgia rheumatica) ([place]) 09/09/2013  . Rheumatoid arthritis ([place]) 04/13/2012  . CAD (coronary artery disease) 12/19/2011  . Insulin dependent diabetes mellitus ([place]) 12/19/2011  . Neuropathy ([place])  12/19/2011    \5:07 PM,02/17/18 Haasini Patnaude PT, DPT [facility] [address], [place], [place], [address] Phone: [phone]   Fax:  [phone]  Name: ELOHIM BRUNE MRN: [id number] Date of Birth: December 16, 1940

## 2018-02-21 ENCOUNTER — Ambulatory Visit: Payer: No Typology Code available for payment source

## 2018-02-21 DIAGNOSIS — M545 Low back pain, unspecified: Secondary | ICD-10-CM

## 2018-02-21 DIAGNOSIS — M6281 Muscle weakness (generalized): Secondary | ICD-10-CM | POA: Diagnosis not present

## 2018-02-21 DIAGNOSIS — G8929 Other chronic pain: Secondary | ICD-10-CM

## 2018-02-21 DIAGNOSIS — R2689 Other abnormalities of gait and mobility: Secondary | ICD-10-CM

## 2018-02-21 DIAGNOSIS — R2681 Unsteadiness on feet: Secondary | ICD-10-CM

## 2018-02-21 NOTE — Therapy (Signed)
[facility] [address], [place], [place], [address] Phone: [phone]   Fax:  [phone]  Physical Therapy Treatment  Patient Details  Name: Jacob [name] MRN: [id number] Date of Birth: 1940/09/05 Referring Provider (PT): Annie Sable, MD   Encounter Date: 02/21/2018  PT End of Session - 02/21/18 1429    Visit Number  13    Date for PT Re-Evaluation  02/28/18    Authorization - Visit Number  13    Authorization - Number of Visits  15    PT Start Time  1400    PT Stop Time  5027    PT Time Calculation (min)  34 min    Activity Tolerance  Patient tolerated treatment well;No increased pain    Behavior During Therapy  WFL for tasks assessed/performed       Past Medical History:  Diagnosis Date  . Allergy   . Anemia   . Anxiety   . Arthritis   . Asthma   . BPH (benign prostatic hyperplasia)   . Cancer of kidney ([place])   . Cataract   . CHF (congestive heart failure) ([place])   . Chronic kidney disease    chronic  kidney failure  kidney function at 42%  . COPD (chronic obstructive pulmonary disease) ([name])   . Coronary artery disease    CABG  7 bypasses  . Diabetes mellitus without complication ([place])   . Hepatitis    many years ago  . Hypertension   . Polymyalgia rheumatica (HCC)    maintained on Prednisone, Plaquenil. Followed by rhuematology every 4 months/[name].  . Shortness of breath dyspnea    with exertion  . Sleep apnea    CPAP   Trying to use    Past Surgical History:  Procedure Laterality Date  . APPENDECTOMY    . CATARACT EXTRACTION W/PHACO Right 11/28/2014   Procedure: CATARACT EXTRACTION PHACO AND INTRAOCULAR LENS PLACEMENT ([place]) RIGHT ;  Surgeon: Marylynn Pearson, MD;  Location: [facility];  Service: Ophthalmology;  Laterality: Right;  . CORONARY ARTERY BYPASS GRAFT  03/17/1995   Wynonia Lawman; followed every six months.  Marland Kitchen HERNIA REPAIR    . LUMBAR LAMINECTOMY/DECOMPRESSION MICRODISCECTOMY N/A  07/30/2015   Procedure: LUMBAR LAMINECTOMY DISCECTOMY ;  Surgeon: Ashok Pall, MD;  Location: [facility];  Service: Neurosurgery;  Laterality: N/A;  LUMBAR LAMINECTOMY DISCECTOMY   . LUMBAR LAMINECTOMY/DECOMPRESSION MICRODISCECTOMY N/A 09/06/2015   Procedure: Redo L3/4 Disectomy;  Surgeon: Ashok Pall, MD;  Location: [facility];  Service: Neurosurgery;  Laterality: N/A;  . PROSTATE SURGERY     TURP at [place].    There were no vitals filed for this visit.  Subjective Assessment - 02/21/18 1401    Subjective  My back kept me awake last night.      Currently in Pain?  Yes    Pain Score  5     Pain Orientation  Right    Pain Descriptors / Indicators  Aching    Pain Type  Chronic pain    Pain Onset  More than a month ago    Aggravating Factors   standing, sometimes at night    Pain Relieving Factors  sitting                       [facility] Adult PT Treatment/Exercise - 02/21/18 0001      Exercises   Exercises  Lumbar;Knee/Hip      Lumbar Exercises: Standing  Other Standing Lumbar Exercises  standing on black foam pad for core activation 3x30 seconds      Lumbar Exercises: Seated   Other Seated Lumbar Exercises  press down on foam roll with core activation x 20      Knee/Hip Exercises: Stretches   Active Hamstring Stretch  Both;3 reps;20 seconds      Knee/Hip Exercises: Aerobic   Nustep  L3 x 10 min   PT present to discuss progress and monitor      Knee/Hip Exercises: Standing   Heel Raises  Both;2 sets;10 reps    Hip Abduction  Stengthening;Both;2 sets;10 reps    Abduction Limitations  side step/back with yellow TB around feet     Hip Extension  Stengthening;Both;2 sets;10 reps    Extension Limitations  yellow TB around ankles, step back       Knee/Hip Exercises: Seated   Long Arc Quad  Strengthening;Both;2 sets;10 reps    Long Arc Quad Weight  2 lbs.   pt not able to do 5# today   Marching  Strengthening;Both;2 sets;10 reps    Marching Weights  2 lbs.                PT Short Term Goals - 02/09/18 1243      PT SHORT TERM GOAL #1   Title  Pt will be independent with his initial HEP to improve flexibility, strength and balance.     Status  Achieved      PT SHORT TERM GOAL #2   Title  Pt will report walking for atleast 5 min, 3x/week to improve his endurance activity participation.     Status  Achieved      PT SHORT TERM GOAL #3   Title  report  < or = to 6/10 LBP with standing and walking    Baseline  8/10- pt with chronic LBP     Status  Deferred        PT Long Term Goals - 02/16/18 1414      PT LONG TERM GOAL #4   Title  Pt will have atleast 6 points increase in his BERG balance score which will indicate a significant improvement in his balance and decrease fall risk.     Baseline  35/56    Time  --    Period  --    Status  Achieved            Plan - 02/21/18 1428    Clinical Impression Statement  Pt with increased low back pain that began last night with sleep.  PT reduced ankle weights to 2# as pt was not able to do with 5# today.  Pt is now performing standing hip exercises with yellow band at home.  PT provided frequent verbal and demo cues for technique with this today a he was substituting and straining at low back to perform.  PT will recheck this again next session to ensure correct technique at home.  Pt had improved Berg balance test last week indicating improved overall mobility. Pt with fatigue today so session ended early.  Pt with chronic weakness and will continue to benefit from skilled PT for strength, balance core activation and gait to improve safety at home and in the community.      Rehab Potential  Good    PT Frequency  2x / week    PT Duration  8 weeks    PT Treatment/Interventions  ADLs/Self Care Home Management;Moist Heat;Traction;Electrical Stimulation;Cryotherapy;Functional mobility training;Therapeutic  activities;Therapeutic exercise;Patient/family education;Manual techniques;Passive range  of motion;Neuromuscular re-education;Balance training;Taping;Spinal Manipulations;Dry needling    PT Next Visit Plan  strength progression, balance activity; flexibility exercises.  Incorporate core strength into exercise.      PT Home Exercise Plan   Access Code: [email or web address]     Consulted and Agree with Plan of Care  Patient       Patient will benefit from skilled therapeutic intervention in order to improve the following deficits and impairments:  Abnormal gait, Decreased activity tolerance, Decreased safety awareness, Decreased strength, Impaired flexibility, Postural dysfunction, Pain, Obesity, Improper body mechanics, Decreased range of motion, Decreased endurance, Decreased balance, Decreased mobility, Difficulty walking, Hypomobility, Increased muscle spasms  Visit Diagnosis: Muscle weakness (generalized)  Unsteadiness on feet  Chronic low back pain, unspecified back pain laterality, unspecified whether sciatica present  Other abnormalities of gait and mobility     Problem List Patient Active Problem List   Diagnosis Date Noted  . Chronic kidney disease (CKD), stage IV (severe) ([place]) 11/27/2015  . Acute on chronic diastolic CHF (congestive heart failure) ([place]) 11/27/2015  . Normocytic anemia 11/27/2015  . AKI (acute kidney injury) ([place])   . Acute kidney injury superimposed on chronic kidney disease ([place]) 10/02/2015  . Atrial fibrillation with RVR ([place]) 10/02/2015  . Hypotension 10/02/2015  . Anorexia   . Adjustment disorder with mixed emotional features 09/01/2015  . Acute encephalopathy 08/29/2015  . Elevated troponin 08/29/2015  . Renal cancer ([place]) 08/28/2015  . Spinal stenosis of lumbar region with radiculopathy 08/07/2015  . COPD (chronic obstructive pulmonary disease) ([place])   . OSA (obstructive sleep apnea) 07/27/2015  . HNP (herniated nucleus pulposus), lumbar 07/24/2015  . CHF with left ventricular diastolic dysfunction, NYHA class 2 ([place]) 01/28/2014  . PMR  (polymyalgia rheumatica) ([place]) 09/09/2013  . Rheumatoid arthritis ([place]) 04/13/2012  . CAD (coronary artery disease) 12/19/2011  . Insulin dependent diabetes mellitus ([place]) 12/19/2011  . Neuropathy ([place]) 12/19/2011    Sigurd Sos, PT 02/21/18 2:35 PM  [facility] [address] [place], [place], [address] Phone: [phone]   Fax:  [phone]  Name: SOLOMAN MCKEITHAN MRN: [id number] Date of Birth: October 05, 1940

## 2018-03-02 ENCOUNTER — Ambulatory Visit: Payer: No Typology Code available for payment source

## 2018-03-02 DIAGNOSIS — M6281 Muscle weakness (generalized): Secondary | ICD-10-CM

## 2018-03-02 DIAGNOSIS — M545 Low back pain, unspecified: Secondary | ICD-10-CM

## 2018-03-02 DIAGNOSIS — R2681 Unsteadiness on feet: Secondary | ICD-10-CM

## 2018-03-02 DIAGNOSIS — R2689 Other abnormalities of gait and mobility: Secondary | ICD-10-CM

## 2018-03-02 DIAGNOSIS — G8929 Other chronic pain: Secondary | ICD-10-CM

## 2018-03-02 NOTE — Therapy (Signed)
[facility] [address], [place], [place], [address] Phone: [phone]   Fax:  [phone]  Physical Therapy Treatment  Patient Details  Name: Jacob [name] MRN: [id number] Date of Birth: 1940-03-19 Referring Provider (PT): Annie Sable, MD   Encounter Date: 03/02/2018  PT End of Session - 03/02/18 1654    Visit Number  14    Date for PT Re-Evaluation  03/23/18    Authorization Type  VA    Authorization - Visit Number  14    Authorization - Number of Visits  15    PT Start Time  1618    PT Stop Time  1656    PT Time Calculation (min)  38 min    Activity Tolerance  Patient tolerated treatment well    Behavior During Therapy  [facility] for tasks assessed/performed       Past Medical History:  Diagnosis Date  . Allergy   . Anemia   . Anxiety   . Arthritis   . Asthma   . BPH (benign prostatic hyperplasia)   . Cancer of kidney ([place])   . Cataract   . CHF (congestive heart failure) ([place])   . Chronic kidney disease    chronic  kidney failure  kidney function at 42%  . COPD (chronic obstructive pulmonary disease) ([place])   . Coronary artery disease    CABG  7 bypasses  . Diabetes mellitus without complication ([place])   . Hepatitis    many years ago  . Hypertension   . Polymyalgia rheumatica (HCC)    maintained on Prednisone, Plaquenil. Followed by rhuematology every 4 months/[name].  . Shortness of breath dyspnea    with exertion  . Sleep apnea    CPAP   Trying to use    Past Surgical History:  Procedure Laterality Date  . APPENDECTOMY    . CATARACT EXTRACTION W/PHACO Right 11/28/2014   Procedure: CATARACT EXTRACTION PHACO AND INTRAOCULAR LENS PLACEMENT ([place]) RIGHT ;  Surgeon: Marylynn Pearson, MD;  Location: [place];  Service: Ophthalmology;  Laterality: Right;  . CORONARY ARTERY BYPASS GRAFT  03/17/1995   Wynonia Lawman; followed every six months.  Marland Kitchen HERNIA REPAIR    . LUMBAR LAMINECTOMY/DECOMPRESSION MICRODISCECTOMY  N/A 07/30/2015   Procedure: LUMBAR LAMINECTOMY DISCECTOMY ;  Surgeon: Ashok Pall, MD;  Location: [place] [facility];  Service: Neurosurgery;  Laterality: N/A;  LUMBAR LAMINECTOMY DISCECTOMY   . LUMBAR LAMINECTOMY/DECOMPRESSION MICRODISCECTOMY N/A 09/06/2015   Procedure: Redo L3/4 Disectomy;  Surgeon: Ashok Pall, MD;  Location: [place] [facility];  Service: Neurosurgery;  Laterality: N/A;  . PROSTATE SURGERY     TURP at [place].    There were no vitals filed for this visit.  Subjective Assessment - 03/02/18 1624    Subjective  I am still having LBP.  My MRI is schedule for 03/04/18    Currently in Pain?  Yes    Pain Score  6     Pain Location  Back    Pain Orientation  Right;Lower    Pain Descriptors / Indicators  Aching    Pain Type  Chronic pain    Pain Onset  More than a month ago    Pain Frequency  Intermittent    Aggravating Factors   standing, night time    Pain Relieving Factors  sitting         [facility] PT Assessment - 03/02/18 0001      Assessment   Medical Diagnosis  other abnormilites  Prior Function   Level of Independence  Independent with basic ADLs    Vocation Requirements  retired       Strength   Right Hip Flexion  4/5    Right Hip ABduction  3/5    Left Hip Flexion  4/5    Left Hip ABduction  3/5    Right Knee Extension  4+/5    Left Knee Extension  4+/5      Berg Balance Test   Sit to Stand  Able to stand  independently using hands    Standing Unsupported  Able to stand safely 2 minutes    Sitting with Back Unsupported but Feet Supported on Floor or Stool  Able to sit safely and securely 2 minutes    Stand to Sit  Controls descent by using hands    Transfers  Able to transfer safely, definite need of hands    Standing Unsupported with Eyes Closed  Able to stand 10 seconds with supervision    Standing Ubsupported with Feet Together  Able to place feet together independently and stand for 1 minute with supervision    From Standing, Reach Forward with Outstretched  Arm  Can reach forward >5 cm safely (2")    From Standing Position, Pick up Object from Floor  Able to pick up shoe, needs supervision    From Standing Position, Turn to Look Behind Over each Shoulder  Needs supervision when turning    Turn 360 Degrees  Able to turn 360 degrees safely but slowly    Standing Unsupported, Alternately Place Feet on Step/Stool  Able to complete >2 steps/needs minimal assist    Standing Unsupported, One Foot in Front  Able to take small step independently and hold 30 seconds    Standing on One Leg  Tries to lift leg/unable to hold 3 seconds but remains standing independently    Total Score  35                   [facility] Adult PT Treatment/Exercise - 03/02/18 0001      Ambulation/Gait   Ambulation/Gait  Yes    Ambulation/Gait Assistance  6: Modified independent (Device/Increase time)    Assistive device  Straight cane    Gait Pattern  Step-through pattern    Gait Comments  walk x 2 minutes      Lumbar Exercises: Standing   Other Standing Lumbar Exercises  standing on black foam pad for core activation 3x30 seconds      Lumbar Exercises: Seated   Other Seated Lumbar Exercises  press down on foam roll with core activation x 20      Knee/Hip Exercises: Stretches   Active Hamstring Stretch  Both;3 reps;20 seconds      Knee/Hip Exercises: Aerobic   Nustep  L3 x 10 min   PT present to discuss progress and monitor      Knee/Hip Exercises: Standing   Heel Raises  Both;2 sets;10 reps    Hip Abduction  Stengthening;Both;2 sets;10 reps    Abduction Limitations  2#-standing on black pad    Hip Extension  Stengthening;Both;2 sets;10 reps    Extension Limitations  2#- standing on black pad      Knee/Hip Exercises: Seated   Long Arc Quad  Strengthening;Both;2 sets;10 reps    Long Arc Quad Weight  2 lbs.    Marching  Strengthening;Both;2 sets;10 reps    Marching Weights  2 lbs.  PT Short Term Goals - 02/09/18 1243      PT  SHORT TERM GOAL #1   Title  Pt will be independent with his initial HEP to improve flexibility, strength and balance.     Status  Achieved      PT SHORT TERM GOAL #2   Title  Pt will report walking for atleast 5 min, 3x/week to improve his endurance activity participation.     Status  Achieved      PT SHORT TERM GOAL #3   Title  report  < or = to 6/10 LBP with standing and walking    Baseline  8/10- pt with chronic LBP     Status  Deferred        PT Long Term Goals - 03/02/18 1659      PT LONG TERM GOAL #1   Title  Pt will demo independence with his advanced HEP to futher promote strenght gains after d/c from PT.     Time  3    Period  Weeks    Target Date  03/23/18      PT LONG TERM GOAL #2   Title  Pt will demo improved functional strength and power of the LEs evident by his ability to complete 5x/sit to stand in less than 14 sec with minimal UE use.     Baseline  14.18 seconds with min UE support    Time  3    Period  Weeks    Target Date  03/23/18      PT LONG TERM GOAL #3   Title  Pt will have increase in BLE strength to atleast 4/5 MMT which will improve his ability to maintain upright standing posture throughout the day.     Time  3    Period  Weeks    Status  On-going    Target Date  03/23/18      PT LONG TERM GOAL #4   Title  Pt will have atleast 6 points increase in his BERG balance score which will indicate a significant improvement in his balance and decrease fall risk.     Baseline  35/56    Status  Achieved      PT LONG TERM GOAL #5   Title  report < or  = to 5/10 LBP with standing and walking    Baseline  up to 8/10- this varies    Status  Deferred            Plan - 03/02/18 1658    Clinical Impression Statement  Pt has had flare up of LBP over the past week or so.  Sleep and standing have been significantly limited.  Pt was able to advance to standing on black balance pad with standing hip exercises to improve core activation.  Close stand by  assist was required for safety with this exercise.  Pt was able to walk 2 minutes with standard cane and needed to rest due to LBP. Pt remains active at home with riding his seated cycle and performing HEP.  MRI is scheduled for this week.  Pt will attend 1 more session and then will determine if further visits are needed from [place].      Rehab Potential  Good    PT Frequency  2x / week    PT Duration  8 weeks    PT Treatment/Interventions  ADLs/Self Care Home Management;Moist Heat;Traction;Electrical Stimulation;Cryotherapy;Functional mobility training;Therapeutic activities;Therapeutic exercise;Patient/family education;Manual techniques;Passive range of motion;Neuromuscular re-education;Balance training;Taping;Spinal  Manipulations;Dry needling    PT Next Visit Plan  strength progression, balance activity; flexibility exercises.  Incorporate core strength into exercise.  1 more session and determine if further PT is needed.    PT Home Exercise Plan   Access Code: [email or web address]     Consulted and Agree with Plan of Care  Patient       Patient will benefit from skilled therapeutic intervention in order to improve the following deficits and impairments:  Abnormal gait, Decreased activity tolerance, Decreased safety awareness, Decreased strength, Impaired flexibility, Postural dysfunction, Pain, Obesity, Improper body mechanics, Decreased range of motion, Decreased endurance, Decreased balance, Decreased mobility, Difficulty walking, Hypomobility, Increased muscle spasms  Visit Diagnosis: Muscle weakness (generalized) - Plan: PT plan of care cert/re-cert  Unsteadiness on feet - Plan: PT plan of care cert/re-cert  Chronic low back pain, unspecified back pain laterality, unspecified whether sciatica present - Plan: PT plan of care cert/re-cert  Other abnormalities of gait and mobility - Plan: PT plan of care cert/re-cert     Problem List Patient Active Problem List   Diagnosis Date Noted  . Chronic  kidney disease (CKD), stage IV (severe) ([place]) 11/27/2015  . Acute on chronic diastolic CHF (congestive heart failure) ([place]) 11/27/2015  . Normocytic anemia 11/27/2015  . AKI (acute kidney injury) ([place])   . Acute kidney injury superimposed on chronic kidney disease ([place]) 10/02/2015  . Atrial fibrillation with RVR ([place]) 10/02/2015  . Hypotension 10/02/2015  . Anorexia   . Adjustment disorder with mixed emotional features 09/01/2015  . Acute encephalopathy 08/29/2015  . Elevated troponin 08/29/2015  . Renal cancer ([place]) 08/28/2015  . Spinal stenosis of lumbar region with radiculopathy 08/07/2015  . COPD (chronic obstructive pulmonary disease) ([place])   . OSA (obstructive sleep apnea) 07/27/2015  . HNP (herniated nucleus pulposus), lumbar 07/24/2015  . CHF with left ventricular diastolic dysfunction, NYHA class 2 ([place]) 01/28/2014  . PMR (polymyalgia rheumatica) ([place]) 09/09/2013  . Rheumatoid arthritis ([place]) 04/13/2012  . CAD (coronary artery disease) 12/19/2011  . Insulin dependent diabetes mellitus ([place]) 12/19/2011  . Neuropathy ([place]) 12/19/2011   Sigurd Sos, PT 03/02/18 5:03 PM  [facility] [address], [place], [place], [address] Phone: [phone]   Fax:  [phone]  Name: Jacob [name] MRN: [id number] Date of Birth: [birth year]

## 2018-03-14 ENCOUNTER — Encounter: Payer: Self-pay | Admitting: Physical Therapy

## 2018-03-14 ENCOUNTER — Ambulatory Visit: Payer: No Typology Code available for payment source | Admitting: Physical Therapy

## 2018-03-14 ENCOUNTER — Encounter

## 2018-03-14 DIAGNOSIS — M545 Low back pain, unspecified: Secondary | ICD-10-CM

## 2018-03-14 DIAGNOSIS — M6281 Muscle weakness (generalized): Secondary | ICD-10-CM | POA: Diagnosis not present

## 2018-03-14 DIAGNOSIS — G8929 Other chronic pain: Secondary | ICD-10-CM

## 2018-03-14 DIAGNOSIS — R2681 Unsteadiness on feet: Secondary | ICD-10-CM

## 2018-03-14 DIAGNOSIS — R2689 Other abnormalities of gait and mobility: Secondary | ICD-10-CM

## 2018-03-14 NOTE — Therapy (Signed)
[facility] [address], [place], [place], [address] Phone: [phone]   Fax:  [phone]  Physical Therapy Treatment/re-evaluation  Patient Details  Name: Jacob [name] MRN: [id number] Date of Birth: [birth year] Referring Provider (PT): Annie Sable, MD   Encounter Date: 03/14/2018  PT End of Session - 03/14/18 1521    Visit Number  15    Date for PT Re-Evaluation  05/13/18    Authorization Type  VA    Authorization Time Period  NEW 03/24/18 to 05/13/18    Authorization - Visit Number  --    Authorization - Number of Visits  --    PT Start Time  2440    PT Stop Time  1729    PT Time Calculation (min)  44 min    Activity Tolerance  Patient tolerated treatment well;Patient limited by pain    Behavior During Therapy  [facility] for tasks assessed/performed       Past Medical History:  Diagnosis Date  . Allergy   . Anemia   . Anxiety   . Arthritis   . Asthma   . BPH (benign prostatic hyperplasia)   . Cancer of kidney ([place])   . Cataract   . CHF (congestive heart failure) ([place])   . Chronic kidney disease    chronic  kidney failure  kidney function at 42%  . COPD (chronic obstructive pulmonary disease) ([place])   . Coronary artery disease    CABG  7 bypasses  . Diabetes mellitus without complication ([place])   . Hepatitis    many years ago  . Hypertension   . Polymyalgia rheumatica (HCC)    maintained on Prednisone, Plaquenil. Followed by rhuematology every 4 months/[name].  . Shortness of breath dyspnea    with exertion  . Sleep apnea    CPAP   Trying to use    Past Surgical History:  Procedure Laterality Date  . APPENDECTOMY    . CATARACT EXTRACTION W/PHACO Right 11/28/2014   Procedure: CATARACT EXTRACTION PHACO AND INTRAOCULAR LENS PLACEMENT ([place]) RIGHT ;  Surgeon: Marylynn Pearson, MD;  Location: [place];  Service: Ophthalmology;  Laterality: Right;  . CORONARY ARTERY BYPASS GRAFT  03/17/1995   Wynonia Lawman; followed  every six months.  Marland Kitchen HERNIA REPAIR    . LUMBAR LAMINECTOMY/DECOMPRESSION MICRODISCECTOMY N/A 07/30/2015   Procedure: LUMBAR LAMINECTOMY DISCECTOMY ;  Surgeon: Ashok Pall, MD;  Location: [place] [facility]UR[facility];  Service: Neurosurgery;  Laterality: N/A;  LUMBAR LAMINECTOMY DISCECTOMY   . LUMBAR LAMINECTOMY/DECOMPRESSION MICRODISCECTOMY N/A 09/06/2015   Procedure: Redo L3/4 Disectomy;  Surgeon: Ashok Pall, MD;  Location: [place] [facility]UR[facility];  Service: Neurosurgery;  Laterality: N/A;  . PROSTATE SURGERY     TURP at [place].    There were no vitals filed for this visit.  Subjective Assessment - 03/14/18 1448    Subjective  Pt states that things are going ok. He is having issues with his sugar levels. He is able to walk maybe 445f twice a day and is in alot of pain following this. He has his MRI last Friday and will have to wait until the end of January to hear those results.     Currently in Pain?  No/denies    Pain Onset  More than a month ago         [facility] 0001      Assessment   Medical Diagnosis  other abnormilites of gait and mobility    Referring Provider (PT)  Annie Sable, MD    Onset Date/Surgical Date  --   2017   Next MD Visit  end of January    Prior Therapy  LBP several years       Balance Screen   Has the patient fallen in the past 6 months  No    How many times?  --   several close falls   Has the patient had a decrease in activity level because of a fear of falling?   No    Is the patient reluctant to leave their home because of a fear of falling?   No      Prior Function   Level of Independence  Independent with basic ADLs    Vocation Requirements  retired       Mining engineer Comments  slouched, forward head and rounded shoulders       Strength   Right Hip Flexion  4/5   tested in sitting   Right Hip ABduction  3+/5    Left Hip Flexion  4/5   tested in sitting   Left Hip ABduction  3/5    Right Knee Extension  5/5    Left  Knee Extension  5/5      Palpation   Palpation comment  tender to palpation of Rt QL, Rt lumbar paraspinals, Rt greater trochanter and gluteals      Transfers   Five time sit to stand comments   14 sec, UE on thighs       Berg Balance Test   Sit to Stand  Able to stand  independently using hands    Standing Unsupported  Able to stand safely 2 minutes    Sitting with Back Unsupported but Feet Supported on Floor or Stool  Able to sit safely and securely 2 minutes    Stand to Sit  Sits safely with minimal use of hands    Transfers  Able to transfer safely, definite need of hands    Standing Unsupported with Eyes Closed  Able to stand 10 seconds safely    Standing Ubsupported with Feet Together  Able to place feet together independently and stand 1 minute safely    From Standing, Reach Forward with Outstretched Arm  Can reach forward >5 cm safely (2")    From Standing Position, Pick up Object from Floor  Able to pick up shoe, needs supervision    From Standing Position, Turn to Look Behind Over each Shoulder  Turn sideways only but maintains balance   pain free   Turn 360 Degrees  Needs close supervision or verbal cueing    Standing Unsupported, Alternately Place Feet on Step/Stool  Able to complete >2 steps/needs minimal assist    Standing Unsupported, One Foot in Front  Able to take small step independently and hold 30 seconds    Standing on One Leg  Tries to lift leg/unable to hold 3 seconds but remains standing independently    Total Score  38                   [facility] Adult PT Treatment/Exercise - 03/14/18 0001      Lumbar Exercises: Standing   Other Standing Lumbar Exercises  rows with green TB x10 reps HEP demo              PT Education - 03/14/18 1550    Education Details  updated HEP; discussed goals and progress    Person(s) Educated  Patient  Methods  Explanation;Handout;Verbal cues    Comprehension  Verbalized understanding;Returned demonstration        PT Short Term Goals - 03/14/18 1508      PT SHORT TERM GOAL #1   Title  Pt will be independent with his initial HEP to improve flexibility, strength and balance.     Status  Achieved      PT SHORT TERM GOAL #2   Title  Pt will report walking for atleast 5 min, 3x/week to improve his endurance activity participation.     Status  Achieved      PT SHORT TERM GOAL #3   Title  report  < or = to 6/10 LBP with standing and walking    Baseline  8/10 pain report pt with chronic LBP     Status  Deferred        PT Long Term Goals - 03/14/18 1510      PT LONG TERM GOAL #1   Title  Pt will demo independence with his advanced HEP to futher promote strenght gains after d/c from PT.     Time  8    Period  Weeks    Status  On-going    Target Date  05/05/18      PT LONG TERM GOAL #2   Title  Pt will demo improved functional strength and power of the LEs evident by his ability to complete 5x/sit to stand in less than 14 sec with minimal UE use.     Baseline  14 seconds with min UE support    Time  8    Period  Weeks    Status  Achieved      PT LONG TERM GOAL #3   Title  Pt will have increase in BLE strength to atleast 4/5 MMT which will improve his ability to maintain upright standing posture throughout the day.     Baseline  4/5 MMT knee strength, hip still 3/5 MMT    Time  3    Period  Weeks    Status  Partially Met      PT LONG TERM GOAL #4   Title  Pt will have atleast 6 points increase in his BERG balance score which will indicate a significant improvement in his balance and decrease fall risk.     Baseline  29/56 increased to 39/56     Status  On-going      PT LONG TERM GOAL #5   Title  report < or  = to 5/10 LBP with standing and walking    Baseline  up to 8/10- this varies    Status  Deferred            Plan - 03/14/18 1541    Clinical Impression Statement  Pt was re-evaluated this visit having met his short term goals and making progress towards many of his long  term goals. He is consistently completing his HEP and recently purchased a seated cycle that he is also using regularly. Pt is walking atleast 482f twice a day, however he feels that his standing and walking is still limited. Pt's LE strength is slightly improved with MMT, however his greater improvements are noted functionally with the 5x sit to stand which he is now able to complete in 14 sec with minimal use of the UEs. He denies any falls since beginning PT, with multiple close falls, and his BERG balance score has increased by 10 points since beginning PT. His score of  39 points still places him at a significant risk of falling and causing injury to himself. Pt would continue to benefit from skilled PT due to his remaining limitations in trunk/LE strength, flexibility and decreased proprioception in order to increase his safety with daily activity around the home and community.     Rehab Potential  Good    PT Frequency  2x / week    PT Duration  6 weeks    PT Treatment/Interventions  ADLs/Self Care Home Management;Moist Heat;Traction;Electrical Stimulation;Cryotherapy;Functional mobility training;Therapeutic activities;Therapeutic exercise;Patient/family education;Manual techniques;Passive range of motion;Neuromuscular re-education;Balance training;Taping;Spinal Manipulations;Dry needling    PT Next Visit Plan  hip flexibility; tandem and unstable surfaces activity; trunk strength (rows, pull-downs, etc); trial manual to decrease muscle spasm Rt lumbar paraspinal/QL/hip region    PT Home Exercise Plan   Access Code: [email or web address]     Consulted and Agree with Plan of Care  Patient       Patient will benefit from skilled therapeutic intervention in order to improve the following deficits and impairments:  Abnormal gait, Decreased activity tolerance, Decreased safety awareness, Decreased strength, Impaired flexibility, Postural dysfunction, Pain, Obesity, Improper body mechanics, Decreased range of motion,  Decreased endurance, Decreased balance, Decreased mobility, Difficulty walking, Hypomobility, Increased muscle spasms  Visit Diagnosis: Muscle weakness (generalized)  Unsteadiness on feet  Chronic low back pain, unspecified back pain laterality, unspecified whether sciatica present  Other abnormalities of gait and mobility     Problem List Patient Active Problem List   Diagnosis Date Noted  . Chronic kidney disease (CKD), stage IV (severe) ([place]) 11/27/2015  . Acute on chronic diastolic CHF (congestive heart failure) ([place]) 11/27/2015  . Normocytic anemia 11/27/2015  . AKI (acute kidney injury) ([place])   . Acute kidney injury superimposed on chronic kidney disease ([place]) 10/02/2015  . Atrial fibrillation with RVR ([place]) 10/02/2015  . Hypotension 10/02/2015  . Anorexia   . Adjustment disorder with mixed emotional features 09/01/2015  . Acute encephalopathy 08/29/2015  . Elevated troponin 08/29/2015  . Renal cancer ([place]) 08/28/2015  . Spinal stenosis of lumbar region with radiculopathy 08/07/2015  . COPD (chronic obstructive pulmonary disease) ([place])   . OSA (obstructive sleep apnea) 07/27/2015  . HNP (herniated nucleus pulposus), lumbar 07/24/2015  . CHF with left ventricular diastolic dysfunction, NYHA class 2 ([place]) 01/28/2014  . PMR (polymyalgia rheumatica) ([place]) 09/09/2013  . Rheumatoid arthritis ([place]) 04/13/2012  . CAD (coronary artery disease) 12/19/2011  . Insulin dependent diabetes mellitus ([place]) 12/19/2011  . Neuropathy ([place]) 12/19/2011   3:55 PM,03/14/18 Sherol Dade PT, DPT [place] [facility] [address] [place], [place], [address] Phone: [phone]   Fax:  [phone]  Name: Jacob [name] MRN: [id number] Date of Birth: September 08, 1940

## 2018-03-14 NOTE — Patient Instructions (Signed)
Access Code: [email or web address]  URL: [email or web address]  Date: 03/14/2018  Prepared by: Sherol Dade   Program Notes  stand at counter and hold on for safety   Exercises  Standing Hip Flexor Stretch - 3 sets - 60 sec hold - 2x daily - 7x weekly  Seated March - 10 reps - 3 sets - 3x daily - 7x weekly  Side Stepping with Counter Support - 10 reps - 2 sets - 1x daily - 7x weekly  Seated Hamstring Stretch - 10 reps - 3 sets - 1x daily - 7x weekly  Heel Toe Raises with Unilateral Counter Support - 15 reps - 2 sets - 1x daily - 7x weekly  Standing Row with Anchored Resistance - 10 reps - 2 sets - 1x daily - 7x weekly    [facility] [address], [place], [place] [address] Phone # [phone] Fax [phone]

## 2018-03-17 ENCOUNTER — Ambulatory Visit: Payer: No Typology Code available for payment source | Admitting: Physical Therapy

## 2018-04-03 IMAGING — MR MR HEAD W/O CM
8 of 10 series · 38 of 48 positions shown · non-contrast
Comparison: 08/03/2015 head CT

CLINICAL DATA: Altered mental status. Patient threatening himself
and others. Lethargy. Evaluation for stroke.

EXAM:
MRI HEAD WITHOUT CONTRAST
TECHNIQUE: Multiplanar, multiecho pulse sequences of the brain and surrounding
structures were obtained without intravenous contrast.

[Series 4: DWI · axial · 3.0mm · 1.09mm/px · z∈[-2,+133]mm · 9 of 96 slices shown (1 of 4)]
[im 1/96]
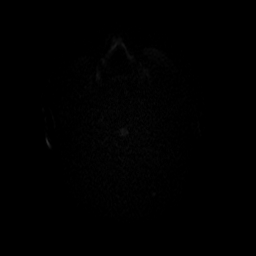
[im 18/96]
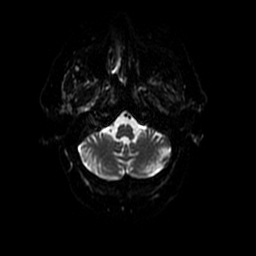
[im 26/96]
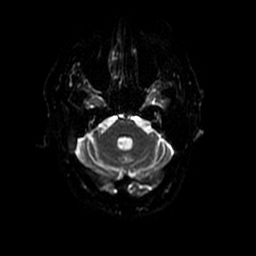
[im 44/96]
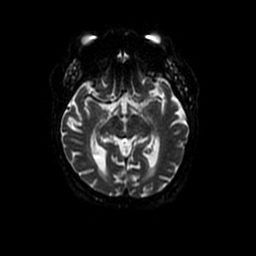
[im 52/96]
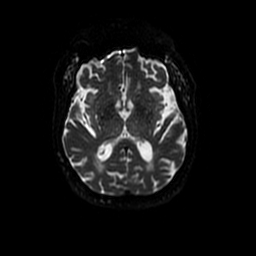
[im 70/96]
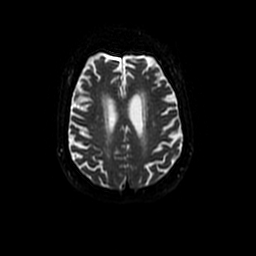
[im 78/96]
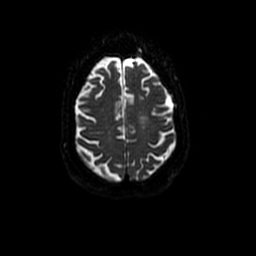
[im 87/96]
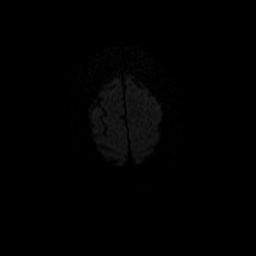
[im 96/96]
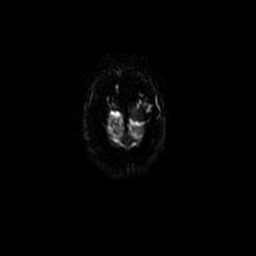

[Series 5: T2 · axial · 5.0mm · 0.45mm/px · z∈[-7,+125]mm · 3 of 24 slices shown (1 of 2)]
[im 1/24]
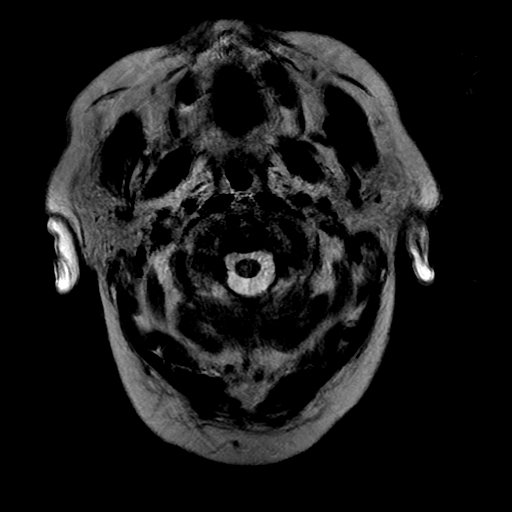
[im 12/24]
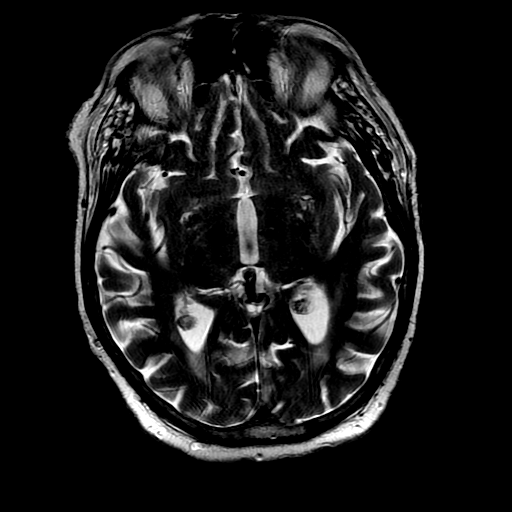
[im 24/24]
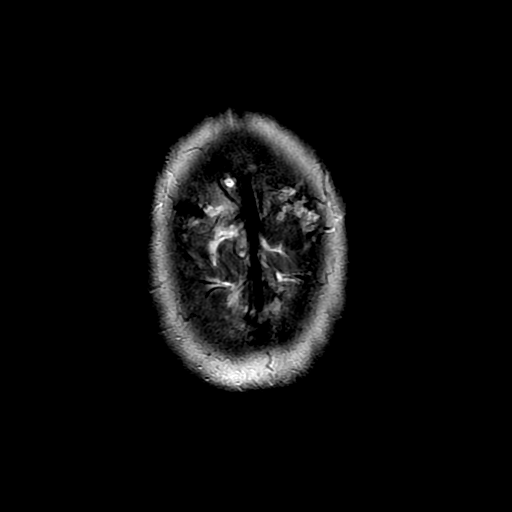

[Series 6: FLAIR · axial · 5.0mm · 0.47mm/px · z∈[-6,+126]mm · 3 of 24 slices shown (1 of 2)]
[im 1/24]
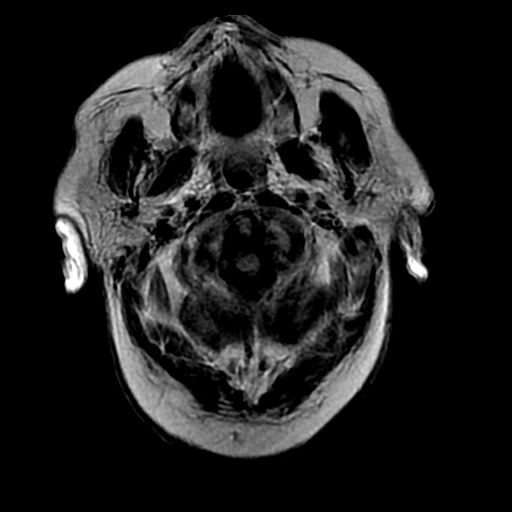
[im 12/24]
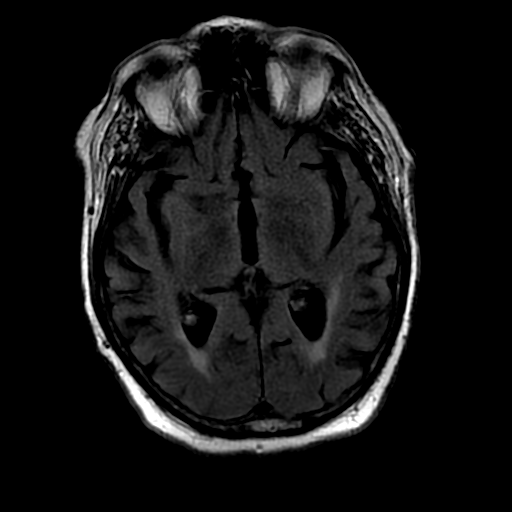
[im 24/24]
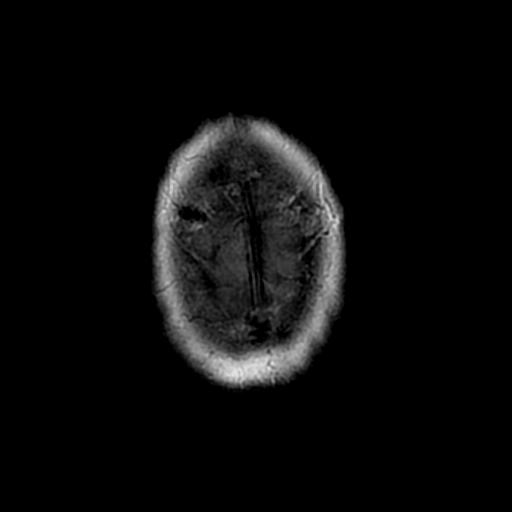

[Series 7: DWI · coronal · 5.0mm · 1.09mm/px · 8 of 68 slices shown (2 of 4)]
[im 1/68]
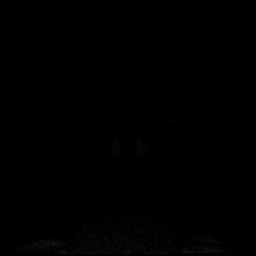
[im 10/68]
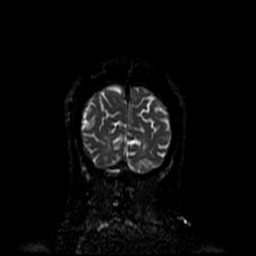
[im 20/68]
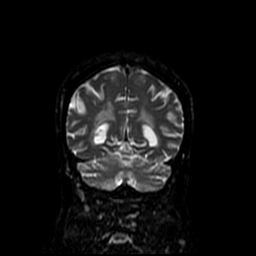
[im 29/68]
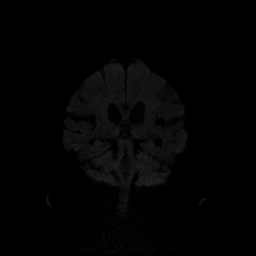
[im 39/68]
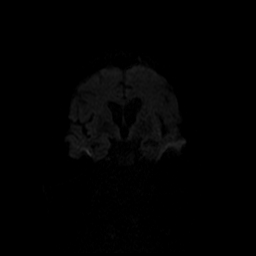
[im 48/68]
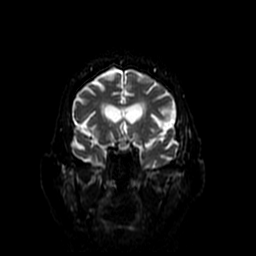
[im 58/68]
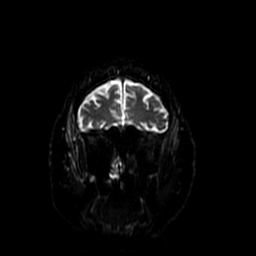
[im 68/68]
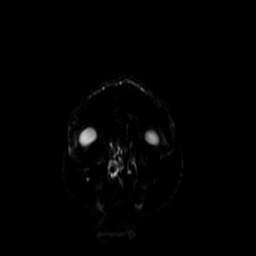

[Series 8: T2 · coronal · 5.0mm · 0.43mm/px · 2 of 29 slices shown (2 of 2)]
[im 1/29]
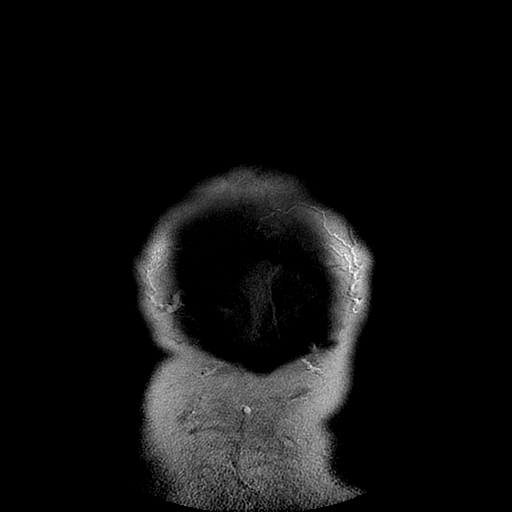
[im 10/29]
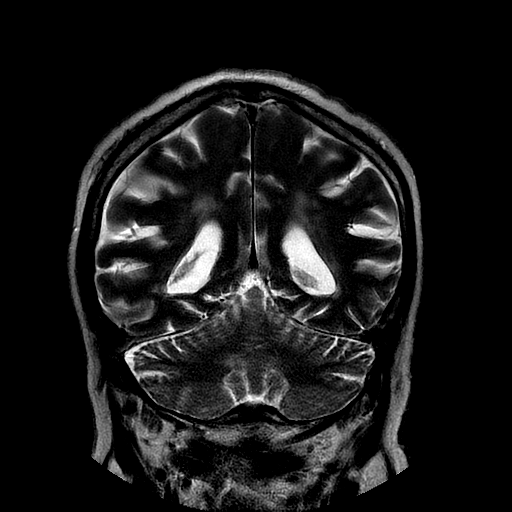

[Series 10: FLAIR · axial · 5.0mm · 0.47mm/px · z∈[-6,+126]mm · 3 of 24 slices shown (2 of 2)]
[im 1/24]
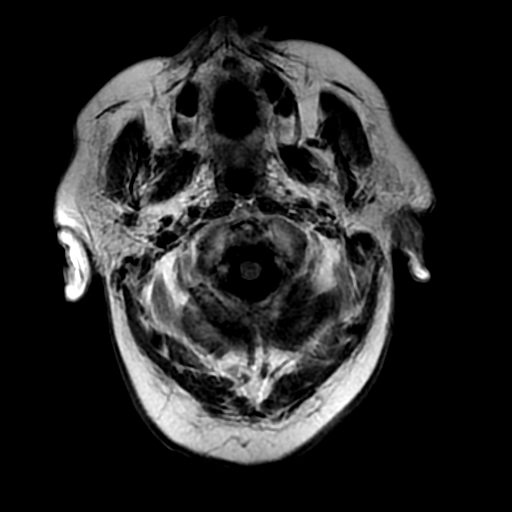
[im 12/24]
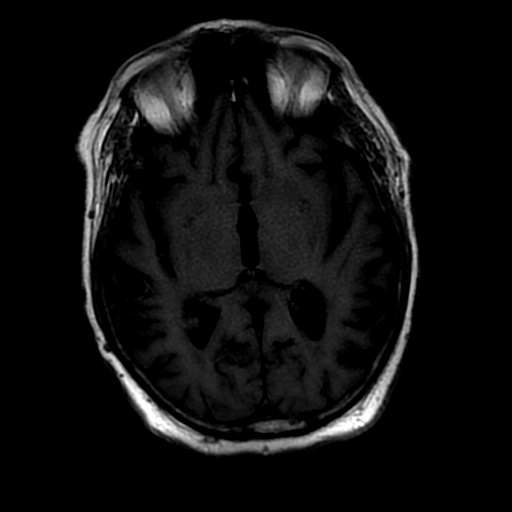
[im 24/24]
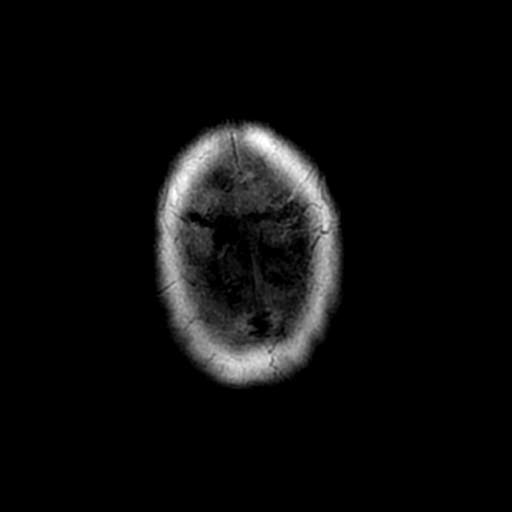

[Series 400: DWI · axial · 3.0mm · 1.09mm/px · z∈[-2,+133]mm · 6 of 48 slices shown (3 of 4)]
[im 1/48]
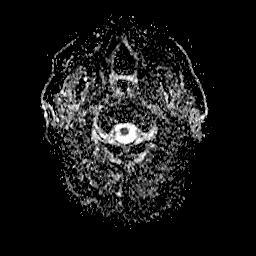
[im 10/48]
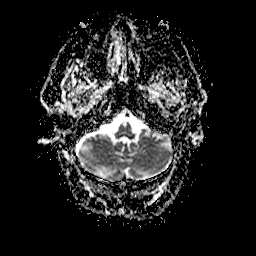
[im 19/48]
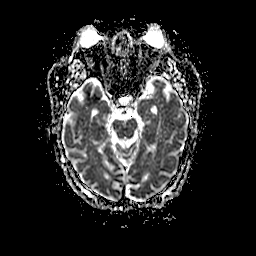
[im 29/48]
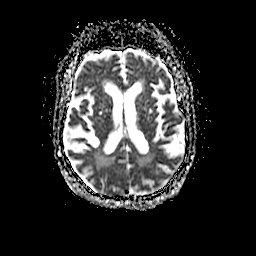
[im 38/48]
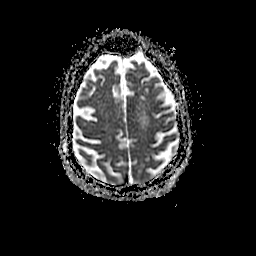
[im 48/48]
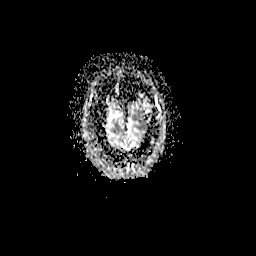

[Series 700: DWI · coronal · 5.0mm · 1.09mm/px · 4 of 34 slices shown (4 of 4)]
[im 1/34]
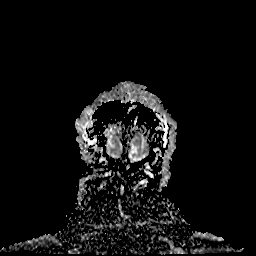
[im 12/34]
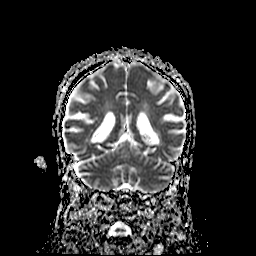
[im 23/34]
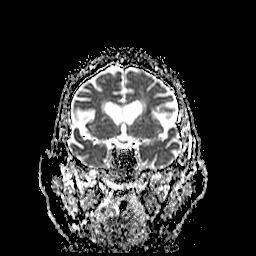
[im 34/34]
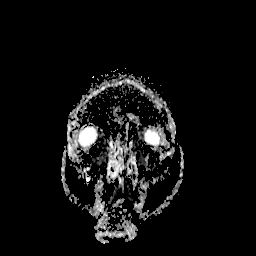

[38 of 48 positions shown; findings below may reference images not displayed]

FINDINGS: The study is mildly motion degraded.

There is no evidence of acute infarct, intracranial hemorrhage,
mass, midline shift, or extra-axial fluid collection. Cerebral
atrophy is within normal limits for age. Patchy cerebral white
matter T2 hyperintensities are nonspecific but compatible with
mild-to-moderate chronic small vessel ischemic disease.

Prior bilateral cataract extraction is noted. Paranasal sinuses and
mastoid air cells are clear. Major intracranial vascular flow voids
are preserved.
IMPRESSION: 1. No acute intracranial abnormality.
2. Mild-to-moderate chronic small vessel ischemic disease.

## 2018-04-04 IMAGING — XA IR FLUORO GUIDE NDL PLMT / BX
1 series · 8 of 8 positions shown · non-contrast
Comparison: none

INDICATION: Lumbar discitis.

[Series 300: dr. (person_name) · 8 of 8 slices shown]
[im 1/8]
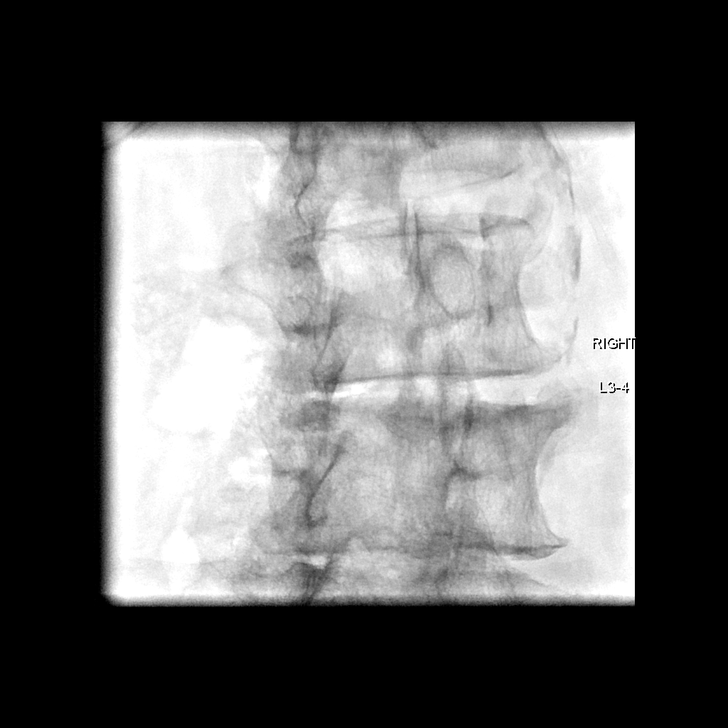
[im 2/8]
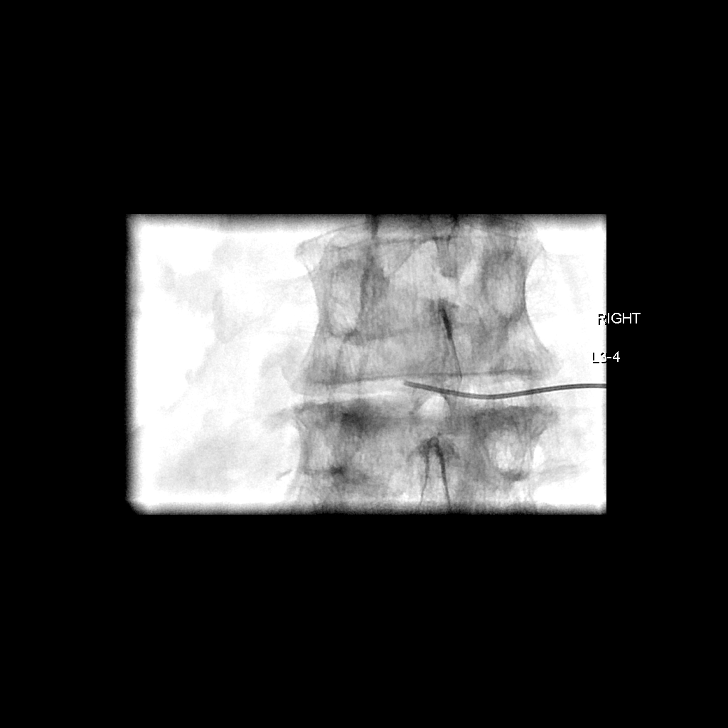
[im 3/8]
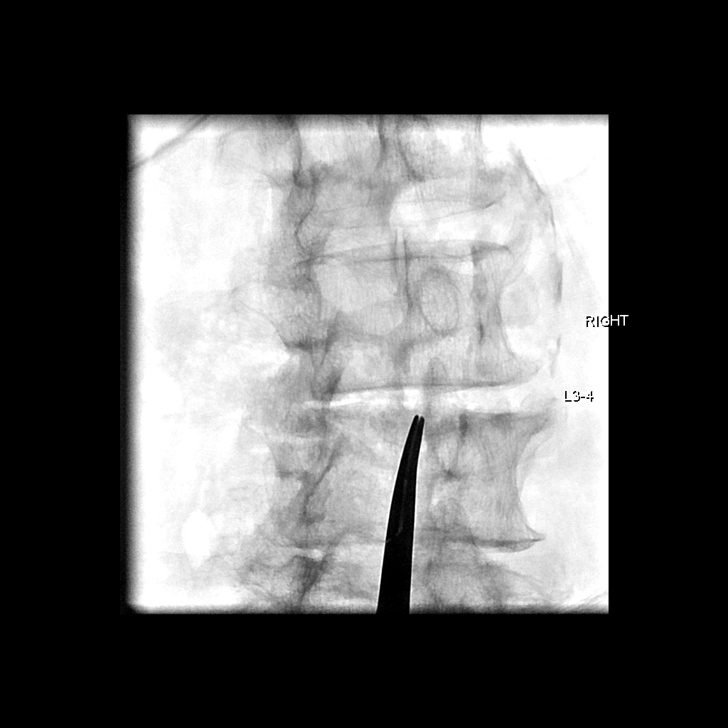
[im 4/8]
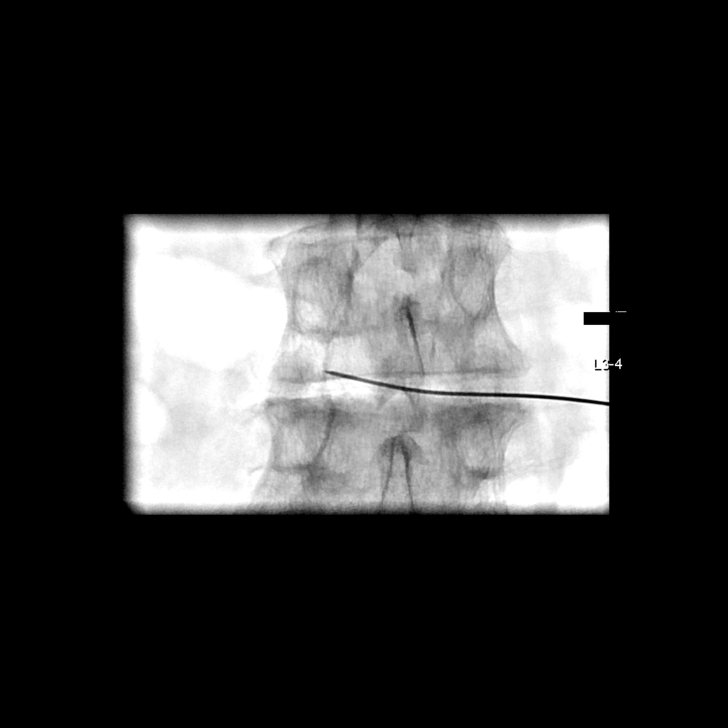
[im 5/8]
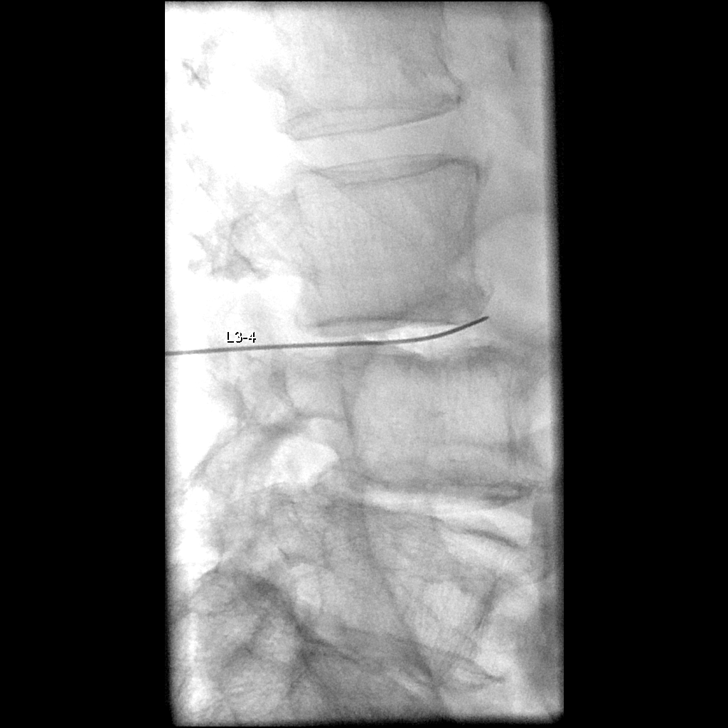
[im 6/8]
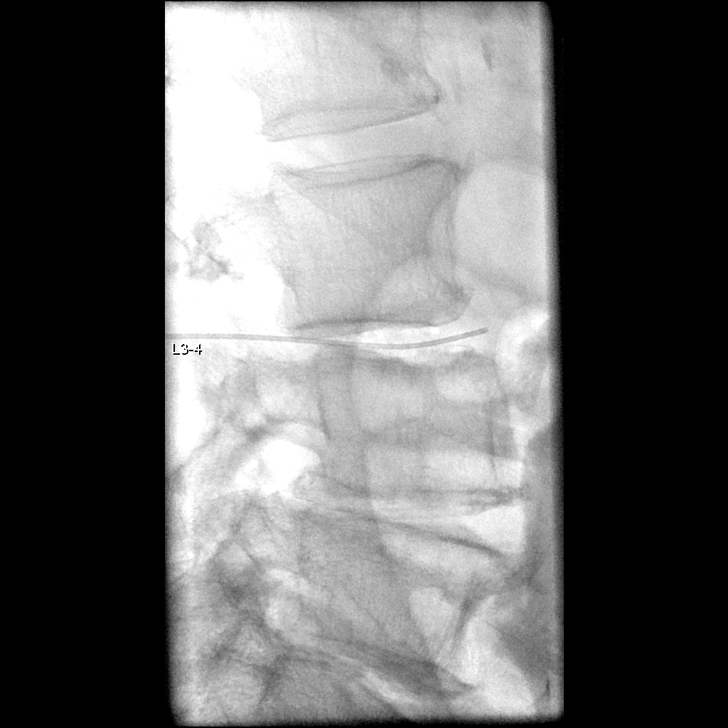
[im 7/8]
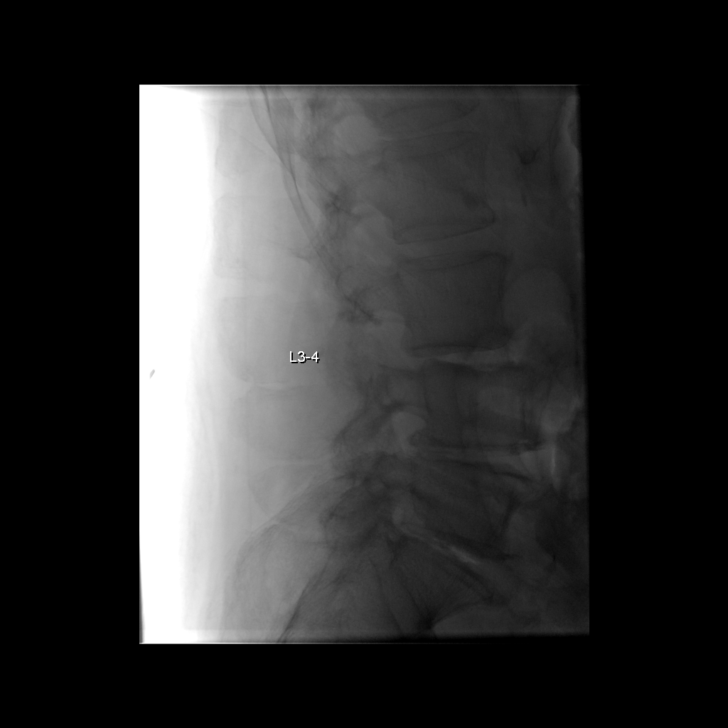
[im 8/8]
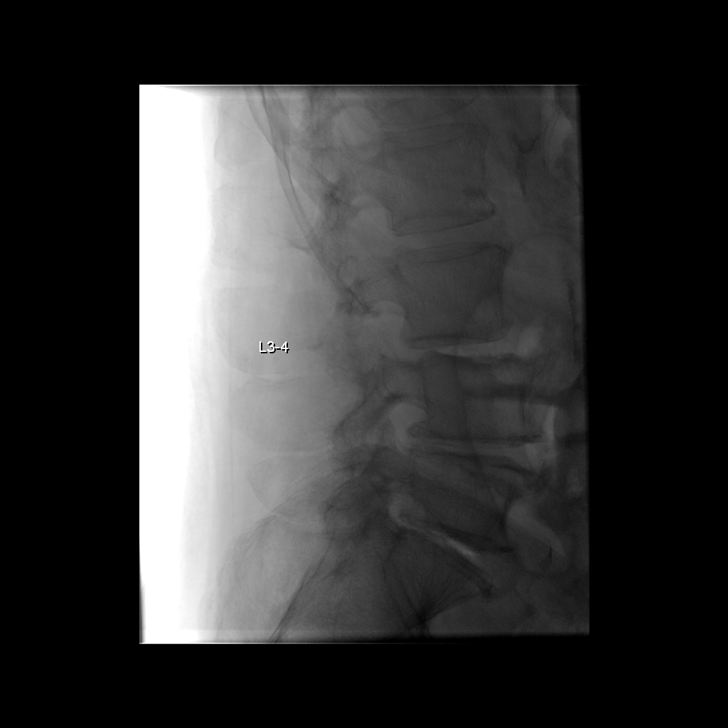

[8 of 8 positions shown; findings below may reference images not displayed]

EXAM:
IR FLUORO GUIDE NEEDLE PLACEMENT /BIOPSY AT L3-L4

MEDICATIONS:
Versed 2 mg IV.  Fentanyl 50 mcg IV.

ANESTHESIA/SEDATION:
Moderate (conscious) sedation was employed during this procedure. A
total of Versed 2 mg and Fentanyl 50 mcg was administered
intravenously.

Moderate Sedation Time: 13 minutes. The patient's level of
consciousness and vital signs were monitored continuously by
radiology nursing throughout the procedure under my direct
supervision.

FLUOROSCOPY TIME:  Fluoroscopy Time: 2 minutes 36 seconds (207 mGy).

COMPLICATIONS:
None immediate.

PROCEDURE:
Informed written consent was obtained from the patient after a
thorough discussion of the procedural risks, benefits and
alternatives. All questions were addressed. Maximal Sterile Barrier
Technique was utilized including caps, mask, sterile gowns, sterile
gloves, sterile drape, hand hygiene and skin antiseptic. A timeout
was performed prior to the initiation of the procedure.

The patient was laid prone on the fluoroscopic table.

The skin overlying the lumbar region was then prepped and draped in
the usual sterile fashion.

Skin entry site at the L3-L4 paraspinous region was then infiltrated
with 0.25% bupivacaine and carried into the paraspinous muscles.

Thereafter, using biplane intermittent fluoroscopy, a 21 gauge
Jersain biopsy needle was then advanced into the L3-L4 disc space
without difficulty. Using a 20 mL syringe, a fleck of bloody
aspirate was obtained with two passes of two separate 21 gauge
Shashi [place]. Approximately 1.5 cc of aspirate was obtained in
total.

Hemostasis was achieved at the skin entry site. The specimens were
then sent for microbiology testing as per request. The patient
tolerated the procedure well with no acute complications.
IMPRESSION: Status post fluoroscopic guided needle placement with aspiration of
the L3-L4 disc space retrieving a total of 1.5 cc of thick bloody
aspirate.

## 2018-04-18 ENCOUNTER — Encounter: Payer: Self-pay | Admitting: *Deleted

## 2018-04-24 DIAGNOSIS — I119 Hypertensive heart disease without heart failure: Secondary | ICD-10-CM

## 2018-04-24 HISTORY — DX: Hypertensive heart disease without heart failure: I11.9

## 2018-04-24 NOTE — Progress Notes (Signed)
Cardiology Office Note:    Date:  04/25/2018   ID:  Jacob [name], DOB [birth year], MRN [id number]  PCP:  [facility]  Cardiologist:  Shirlee More, MD    Referring MD: [facility]    ASSESSMENT:    1. Coronary artery disease of native artery of native heart with stable angina pectoris ([place])   2. Chronic diastolic heart failure ([place])   3. Hypertensive heart disease with chronic diastolic congestive heart failure ([place])   4. Chronic obstructive pulmonary disease, unspecified COPD type ([place])   5. On amiodarone therapy   6. PAF (paroxysmal atrial fibrillation) ([place])   7. Chronic anticoagulation    PLAN:    In order of problems listed above:  1. Stable CAD continue medical treatment this time does not require an ischemia evaluation 2. Stable compensated New York Heart Association class I continue his current loop diuretic 3. Stable blood pressure target continue current treatment occluding beta-blocker diuretic and hydralazine calcium channel blocker 4. Stable managed through the [facility] hospital 5. Stable continue low-dose amiodarone 100 mg/day labs for thyroid liver to follow through the [place] system 6. Stable maintain sinus rhythm continue amiodarone and his Eliquis 7. Continue his anticoagulant   Next appointment: 6 months   Medication Adjustments/Labs and Tests Ordered: Current medicines are reviewed at length with the patient today.  Concerns regarding medicines are outlined above.  No orders of the defined types were placed in this encounter.  No orders of the defined types were placed in this encounter.   Chief Complaint  Patient presents with  . Follow-up    for   . Coronary Artery Disease  . Hypertension  . Congestive Heart Failure  . Atrial Fibrillation  . Anticoagulation    History of Present Illness:    Jacob [name] is a 78 y.o. male with a hx of coronary artery disease with CABG type 2 diabetes hypertension, heart failure chronic  kidney disease COPD and sleep apnea last seen 10/18/2017 at which time he was maintaining sinus rhythm on amiodarone compliant with anticoagulants blood pressure at target and congestive heart failure compensated.  Echocardiogram 11/29/2015 showed mild left ventricular hypertrophy normal left ventricular size ejection fraction 40 to 45% with diffuse hypokinesia moderate left atrial enlargement and mild mitral regurgitation. EF 45% bu MUGA 06/29/16.  He was seen in the emergency department 04/18/2017 at [facility] when he had epistasis and nasal packing.  Compliance with diet, lifestyle and medications: yes Past Medical History:  Diagnosis Date  . Allergy   . Anemia   . Anxiety   . Arthritis   . Asthma   . BPH (benign prostatic hyperplasia)   . Cancer of kidney ([place])   . Cataract   . CHF (congestive heart failure) ([place])   . Chronic kidney disease    chronic  kidney failure  kidney function at 42%  . COPD (chronic obstructive pulmonary disease) ([place])   . Coronary artery disease    CABG  7 bypasses  . Diabetes mellitus without complication ([place])   . GERD (gastroesophageal reflux disease)   . Gout   . Hepatitis    many years ago  . Hyperlipidemia   . Hypertension   . Lumbar disc disease   . Obesity   . Paroxysmal atrial fibrillation ([facility])   . Peripheral neuropathy   . Polymyalgia rheumatica (H[facility])    maintained on Prednisone, Plaquenil. Followed by rhuematology every 4 months/[name].  . Shortness of breath dyspnea  with exertion  . Sleep apnea    CPAP   Trying to use    Past Surgical History:  Procedure Laterality Date  . APPENDECTOMY    . CARDIAC CATHETERIZATION  1998, 2000   left  . CATARACT EXTRACTION W/PHACO Right 11/28/2014   Procedure: CATARACT EXTRACTION PHACO AND INTRAOCULAR LENS PLACEMENT ([place]) RIGHT ;  Surgeon: Marylynn Pearson, MD;  Location: [facility];  Service: Ophthalmology;  Laterality: Right;  . CORONARY ARTERY BYPASS GRAFT  03/17/1995   [facility]; followed every  six months.  Marland Kitchen HERNIA REPAIR    . LUMBAR LAMINECTOMY/DECOMPRESSION MICRODISCECTOMY N/A 07/30/2015   Procedure: LUMBAR LAMINECTOMY DISCECTOMY ;  Surgeon: Ashok Pall, MD;  Location: [facility]S;  Service: Neurosurgery;  Laterality: N/A;  LUMBAR LAMINECTOMY DISCECTOMY   . LUMBAR LAMINECTOMY/DECOMPRESSION MICRODISCECTOMY N/A 09/06/2015   Procedure: Redo L3/4 Disectomy;  Surgeon: Ashok Pall, MD;  Location: [facility];  Service: Neurosurgery;  Laterality: N/A;  . PROSTATE SURGERY     TURP at [facility] [place].  Marland Kitchen TRANSURETHRAL RESECTION OF PROSTATE      Current Medications: Current Meds  Medication Sig  . ACIDOPHILUS LACTOBACILLUS PO Take 2 tablets by mouth daily.  Marland Kitchen albuterol (PROVENTIL HFA;VENTOLIN HFA) 108 (90 BASE) MCG/ACT inhaler Inhale 2 puffs into the lungs every 6 (six) hours as needed for wheezing or shortness of breath.  Marland Kitchen amiodarone (PACERONE) 200 MG tablet Take 0.5 tablets (100 mg total) by mouth daily.  Marland Kitchen amLODipine (NORVASC) 10 MG tablet Take 10 mg by mouth daily.  Marland Kitchen apixaban (ELIQUIS) 5 MG TABS tablet Take 5 mg by mouth 2 (two) times daily.  . budesonide-formoterol (SYMBICORT) 160-4.5 MCG/ACT inhaler Inhale 2 puffs into the lungs 2 (two) times daily.  . calcitRIOL (ROCALTROL) 0.25 MCG capsule Take 0.25 mcg by mouth every Monday, Wednesday, and Friday.  . cycloSPORINE (RESTASIS) 0.05 % ophthalmic emulsion Place 1 drop into both eyes 2 (two) times daily.  . finasteride (PROSCAR) 5 MG tablet Take 5 mg by mouth at bedtime.   . furosemide (LASIX) 20 MG tablet Take 1 tablet (20 mg total) by mouth daily.  . insulin aspart protamine- aspart (NOVOLOG MIX 70/30) (70-30) 100 UNIT/ML injection Inject into the skin as directed.   . magnesium oxide (MAG-OX) 400 MG tablet Take 400 mg by mouth at bedtime.  . metoprolol (LOPRESSOR) 50 MG tablet Take 1 tablet (50 mg total) by mouth 2 (two) times daily.  . montelukast (SINGULAIR) 10 MG tablet Take 1 tablet (10 mg total) by mouth at bedtime.  . Multiple  Vitamins-Minerals (PRESERVISION AREDS 2 PO) Take 2 tablets by mouth daily.   . nitroGLYCERIN (NITROSTAT) 0.4 MG SL tablet Place 0.4 mg under the tongue every 5 (five) minutes as needed for chest pain.  . pantoprazole (PROTONIX) 40 MG tablet Take 40 mg by mouth 2 (two) times daily.   . pilocarpine (PILOCAR) 2 % ophthalmic solution Place 1 drop into both eyes 3 (three) times daily.  . predniSONE (DELTASONE) 5 MG tablet Take 5 mg by mouth daily with breakfast. IN CONJUNCTION WITH TWO 1 MG TABLETS TO EQUAL A TOTAL OF 7 MILLIGRAMS     Allergies:   Ace inhibitors; Actonel [risedronate]; Ciprocinonide [fluocinolone]; Flunisolide; Metformin and related; Sertraline; Sulindac; and Terazosin   Social History   Socioeconomic History  . Marital status: Married    Spouse name: Not on file  . Number of children: Not on file  . Years of education: Not on file  . Highest education level: Not on file  Occupational History  . Occupation: Diplomatic Services operational officer  . Financial resource strain: Not on file  . Food insecurity:    Worry: Not on file    Inability: Not on file  . Transportation needs:    Medical: Not on file    Non-medical: Not on file  Tobacco Use  . Smoking status: Former Smoker    Last attempt to quit: 03/16/1957    Years since quitting: 61.1  . Smokeless tobacco: Never Used  Substance and Sexual Activity  . Alcohol use: No  . Drug use: No  . Sexual activity: Not Currently    Birth control/protection: None  Lifestyle  . Physical activity:    Days per week: Not on file    Minutes per session: Not on file  . Stress: Not on file  Relationships  . Social connections:    Talks on phone: Not on file    Gets together: Not on file    Attends religious service: Not on file    Active member of club or organization: Not on file    Attends meetings of clubs or organizations: Not on file    Relationship status: Not on file  Other Topics Concern  . Not on file  Social History Narrative    Marital status: Married x 55 years.      Children: 3 children; 5 grandchildren; 6 gg.      Lives: with wife.        Employment: retired; [employer].  Working every other week; 45 hours.      Tobacco:  Quit at age 24.      Alcohol:  None      Exercise: none      ADLs: indepedent with ADLs;  Uses cane and has a walker.  Drives.      Advanced Directives:  +Living Will.  DNR/DNI.  Wife is HCPOA.     Family History: The patient's family history includes Diabetes in his brother; Hypertension in an other family member; Kidney failure in his brother. ROS:   Please see the history of present illness.    All other systems reviewed and are negative.  EKGs/Labs/Other Studies Reviewed:    The following studies were reviewed today:  EKG:  EKG ordered today.  The ekg ordered today demonstrates sinus rhythm right bundle branch block  Recent Labs:   10/18/2017 creatinine 2.31 GFR 26 cc stage IV TSH mildly elevated at 5.05 No results found for requested labs within last 8760 hours.  Recent Lipid Panel    Component Value Date/Time   CHOL 207 (H) 01/24/2014 1425   TRIG 114 01/24/2014 1425   HDL 51 01/24/2014 1425   CHOLHDL 4.1 01/24/2014 1425   VLDL 23 01/24/2014 1425   LDLCALC 133 (H) 01/24/2014 1425    Physical Exam:    VS:  There were no vitals taken for this visit.    Wt Readings from Last 3 Encounters:  12/19/15 170 lb 12.8 oz (77.5 kg)  12/04/15 165 lb 3.2 oz (74.9 kg)  12/03/15 165 lb 3.2 oz (74.9 kg)     GEN:  Well nourished, well developed in no acute distress HEENT: Normal NECK: No JVD; No carotid bruits LYMPHATICS: No lymphadenopathy CARDIAC: RRR, no murmurs, rubs, gallops RESPIRATORY:  Clear to auscultation without rales, wheezing or rhonchi  ABDOMEN: Soft, non-tender, non-distended MUSCULOSKELETAL:  No edema; No deformity  SKIN: Warm and dry NEUROLOGIC:  Alert and oriented x 3 PSYCHIATRIC:  Normal affect  Signed, Shirlee More, MD    04/25/2018 1:40 PM    [place]

## 2018-04-25 ENCOUNTER — Encounter: Payer: Self-pay | Admitting: Cardiology

## 2018-04-25 ENCOUNTER — Ambulatory Visit (INDEPENDENT_AMBULATORY_CARE_PROVIDER_SITE_OTHER): Payer: PPO | Admitting: Cardiology

## 2018-04-25 VITALS — BP 120/52 | HR 56 | Ht 71.0 in | Wt 221.0 lb

## 2018-04-25 DIAGNOSIS — I5032 Chronic diastolic (congestive) heart failure: Secondary | ICD-10-CM | POA: Diagnosis not present

## 2018-04-25 DIAGNOSIS — J449 Chronic obstructive pulmonary disease, unspecified: Secondary | ICD-10-CM | POA: Diagnosis not present

## 2018-04-25 DIAGNOSIS — I110 Hypertensive heart disease with heart failure: Secondary | ICD-10-CM | POA: Diagnosis not present

## 2018-04-25 DIAGNOSIS — I480 Paroxysmal atrial fibrillation: Secondary | ICD-10-CM

## 2018-04-25 DIAGNOSIS — I25118 Atherosclerotic heart disease of native coronary artery with other forms of angina pectoris: Secondary | ICD-10-CM | POA: Diagnosis not present

## 2018-04-25 DIAGNOSIS — Z7901 Long term (current) use of anticoagulants: Secondary | ICD-10-CM | POA: Diagnosis not present

## 2018-04-25 DIAGNOSIS — Z79899 Other long term (current) drug therapy: Secondary | ICD-10-CM | POA: Diagnosis not present

## 2018-04-25 NOTE — Patient Instructions (Signed)
Medication Instructions:  Your physician recommends that you continue on your current medications as directed. Please refer to the Current Medication list given to you today.  If you need a refill on your cardiac medications before your next appointment, please call your pharmacy.   Lab work: NONE  Testing/Procedures: An EKG was performed today.  Follow-Up: At [facility], you and your health needs are our priority.  As part of our continuing mission to provide you with exceptional heart care, we have created designated Provider Care Teams.  These Care Teams include your primary Cardiologist (physician) and Advanced Practice Providers (APPs -  Physician Assistants and Nurse Practitioners) who all work together to provide you with the care you need, when you need it. You will need a follow up appointment in 6 months.  Please call our office 2 months in advance to schedule this appointment.   [facility] Provider [place]: Dr. Bettina Gavia, MD

## 2018-05-02 ENCOUNTER — Ambulatory Visit: Payer: No Typology Code available for payment source | Attending: Internal Medicine

## 2018-05-02 DIAGNOSIS — M545 Low back pain, unspecified: Secondary | ICD-10-CM

## 2018-05-02 DIAGNOSIS — R2689 Other abnormalities of gait and mobility: Secondary | ICD-10-CM | POA: Diagnosis present

## 2018-05-02 DIAGNOSIS — R2681 Unsteadiness on feet: Secondary | ICD-10-CM | POA: Insufficient documentation

## 2018-05-02 DIAGNOSIS — G8929 Other chronic pain: Secondary | ICD-10-CM | POA: Diagnosis present

## 2018-05-02 DIAGNOSIS — M6281 Muscle weakness (generalized): Secondary | ICD-10-CM | POA: Diagnosis not present

## 2018-05-02 NOTE — Therapy (Signed)
[facility] [address], [place], [place], [address] Phone: [phone]   Fax:  [phone]  Physical Therapy Treatment  Patient Details  Name: QUASHAUN LAZALDE MRN: [id number] Date of Birth: March 09, 1941 Referring Provider (PT): Annie Sable, MD   Encounter Date: 05/02/2018  PT End of Session - 05/02/18 1221    Visit Number  16    Date for PT Re-Evaluation  05/13/18    Authorization Type  [place]    Authorization Time Period  15 additional from 04/12/18-08/11/18    Authorization - Visit Number  1    Authorization - Number of Visits  15    PT Start Time  1148   pain   PT Stop Time  1220    PT Time Calculation (min)  32 min    Activity Tolerance  Patient limited by pain    Behavior During Therapy  [facility] for tasks assessed/performed       Past Medical History:  Diagnosis Date  . Allergy   . Anemia   . Anxiety   . Arthritis   . Asthma   . BPH (benign prostatic hyperplasia)   . Cancer of kidney ([place])   . Cataract   . CHF (congestive heart failure) ([place])   . Chronic kidney disease    chronic  kidney failure  kidney function at 42%  . COPD (chronic obstructive pulmonary disease) ([place])   . Coronary artery disease    CABG  7 bypasses  . Diabetes mellitus without complication ([place])   . GERD (gastroesophageal reflux disease)   . Gout   . Hepatitis    many years ago  . Hyperlipidemia   . Hypertension   . Lumbar disc disease   . Obesity   . Paroxysmal atrial fibrillation (HCC)   . Peripheral neuropathy   . Polymyalgia rheumatica (HCC)    maintained on Prednisone, Plaquenil. Followed by rhuematology every 4 months/[name].  . Shortness of breath dyspnea    with exertion  . Sleep apnea    CPAP   Trying to use    Past Surgical History:  Procedure Laterality Date  . APPENDECTOMY    . CARDIAC CATHETERIZATION  1998, 2000   left  . CATARACT EXTRACTION W/PHACO Right 11/28/2014   Procedure: CATARACT EXTRACTION PHACO  AND INTRAOCULAR LENS PLACEMENT ([place]) RIGHT ;  Surgeon: Marylynn Pearson, MD;  Location: [facility];  Service: Ophthalmology;  Laterality: Right;  . CORONARY ARTERY BYPASS GRAFT  03/17/1995   Wynonia Lawman; followed every six months.  Marland Kitchen HERNIA REPAIR    . LUMBAR LAMINECTOMY/DECOMPRESSION MICRODISCECTOMY N/A 07/30/2015   Procedure: LUMBAR LAMINECTOMY DISCECTOMY ;  Surgeon: Ashok Pall, MD;  Location: [place] [facility];  Service: Neurosurgery;  Laterality: N/A;  LUMBAR LAMINECTOMY DISCECTOMY   . LUMBAR LAMINECTOMY/DECOMPRESSION MICRODISCECTOMY N/A 09/06/2015   Procedure: Redo L3/4 Disectomy;  Surgeon: Ashok Pall, MD;  Location: [place] [facility];  Service: Neurosurgery;  Laterality: N/A;  . PROSTATE SURGERY     TURP at [place].  Marland Kitchen TRANSURETHRAL RESECTION OF PROSTATE      There were no vitals filed for this visit.  Subjective Assessment - 05/02/18 1144    Subjective  Lapse in treatment since 03/14/18 due to waiting on insurance approval.  I saw Dr Nelva Bush and he said "I have a terrible back".  I am going to get injections on Thursday.      Pertinent History  CABG, lumbar lamiectomy and microdiscectomy, DM with neuropathy    Currently in Pain?  Yes    Pain Score  8     Pain Orientation  Left;Right    Pain Descriptors / Indicators  Aching    Pain Type  Chronic pain    Pain Onset  More than a month ago    Pain Frequency  Intermittent    Aggravating Factors   standing, all activity, night time    Pain Relieving Factors  sitting, rest                       [facility] Adult PT Treatment/Exercise - 05/02/18 0001      Lumbar Exercises: Standing   Other Standing Lumbar Exercises  rows with green TB x10 reps    seated     Knee/Hip Exercises: Aerobic   Nustep  L3 x 10 min   PT present to discuss progress and monitor      Knee/Hip Exercises: Seated   Long Arc Quad  Strengthening;Both;2 sets;10 reps    Long Arc Quad Weight  2 lbs.    Ball Squeeze  x20     Clamshell with [employer]   x20   Marching   Strengthening;Both;2 sets;10 reps    [employer]  2 lbs.               PT Short Term Goals - 03/14/18 1508      PT SHORT TERM GOAL #1   Title  Pt will be independent with his initial HEP to improve flexibility, strength and balance.     Status  Achieved      PT SHORT TERM GOAL #2   Title  Pt will report walking for atleast 5 min, 3x/week to improve his endurance activity participation.     Status  Achieved      PT SHORT TERM GOAL #3   Title  report  < or = to 6/10 LBP with standing and walking    Baseline  8/10 pain report pt with chronic LBP     Status  Deferred        PT Long Term Goals - 03/14/18 1510      PT LONG TERM GOAL #1   Title  Pt will demo independence with his advanced HEP to futher promote strenght gains after d/c from PT.     Time  8    Period  Weeks    Status  On-going    Target Date  05/05/18      PT LONG TERM GOAL #2   Title  Pt will demo improved functional strength and power of the LEs evident by his ability to complete 5x/sit to stand in less than 14 sec with minimal UE use.     Baseline  14 seconds with min UE support    Time  8    Period  Weeks    Status  Achieved      PT LONG TERM GOAL #3   Title  Pt will have increase in BLE strength to atleast 4/5 MMT which will improve his ability to maintain upright standing posture throughout the day.     Baseline  4/5 MMT knee strength, hip still 3/5 MMT    Time  3    Period  Weeks    Status  Partially Met      PT LONG TERM GOAL #4   Title  Pt will have atleast 6 points increase in his BERG balance score which will indicate a significant improvement in his balance and  decrease fall risk.     Baseline  29/56 increased to 39/56     Status  On-going      PT LONG TERM GOAL #5   Title  report < or  = to 5/10 LBP with standing and walking    Baseline  up to 8/10- this varies    Status  Deferred            Plan - 05/02/18 1202    Clinical Impression Statement  Pt with lapse in  treatment awaiting insurance approval.  Pt has had significant LBP especially with standing so he has not been able to do much exercise at home.  He has stayed consistent with riding his seated cycle 3 miles a day.  Exercise was performed seated today due to 8/10 LBP.  Pt will receive injections next week for lumbar pain and PT will advance exercise for gait and strength as tolerated.  PT was present throughout session to monitor for symptoms and safety.      Rehab Potential  Good    PT Frequency  2x / week    PT Duration  6 weeks    PT Treatment/Interventions  ADLs/Self Care Home Management;Moist Heat;Traction;Electrical Stimulation;Cryotherapy;Functional mobility training;Therapeutic activities;Therapeutic exercise;Patient/family education;Manual techniques;Passive range of motion;Neuromuscular re-education;Balance training;Taping;Spinal Manipulations;Dry needling    PT Next Visit Plan  recert needed by 0/35/59.  Exercise as tolerated by pt.  Standing balance exercise as able.      PT Home Exercise Plan   Access Code: [email or web address]     Recommended Other Services  cert and recert is signed    Consulted and Agree with Plan of Care  Patient       Patient will benefit from skilled therapeutic intervention in order to improve the following deficits and impairments:  Abnormal gait, Decreased activity tolerance, Decreased safety awareness, Decreased strength, Impaired flexibility, Postural dysfunction, Pain, Obesity, Improper body mechanics, Decreased range of motion, Decreased endurance, Decreased balance, Decreased mobility, Difficulty walking, Hypomobility, Increased muscle spasms  Visit Diagnosis: Muscle weakness (generalized)  Unsteadiness on feet  Chronic low back pain, unspecified back pain laterality, unspecified whether sciatica present  Other abnormalities of gait and mobility     Problem List Patient Active Problem List   Diagnosis Date Noted  . Hypertensive heart disease 04/24/2018  .  CKD (chronic kidney disease) 11/27/2015  . Chronic diastolic heart failure ([place]) 11/27/2015  . Normocytic anemia 11/27/2015  . AKI (acute kidney injury) ([place])   . Acute kidney injury superimposed on chronic kidney disease ([place]) 10/02/2015  . Atrial fibrillation with RVR ([place]) 10/02/2015  . Hypotension 10/02/2015  . Anorexia   . Adjustment disorder with mixed emotional features 09/01/2015  . Acute encephalopathy 08/29/2015  . Elevated troponin 08/29/2015  . Renal cancer ([place]) 08/28/2015  . Spinal stenosis of lumbar region with radiculopathy 08/07/2015  . COPD (chronic obstructive pulmonary disease) ([place])   . OSA (obstructive sleep apnea) 07/27/2015  . HNP (herniated nucleus pulposus), lumbar 07/24/2015  . PMR (polymyalgia rheumatica) ([place]) 09/09/2013  . Rheumatoid arthritis ([place]) 04/13/2012  . CAD (coronary artery disease) 12/19/2011  . Insulin dependent diabetes mellitus ([place]) 12/19/2011  . Neuropathy ([place]) 12/19/2011    Sigurd Sos, PT 05/02/18 12:28 PM  [facility] [address] [place], [place], [address] Phone: [phone]   Fax:  [phone]  Name: KYI ROMANELLO MRN: [id number] Date of Birth: 18-Apr-1940

## 2018-05-05 DIAGNOSIS — M5416 Radiculopathy, lumbar region: Secondary | ICD-10-CM

## 2018-05-05 HISTORY — DX: Radiculopathy, lumbar region: M54.16

## 2018-05-09 ENCOUNTER — Ambulatory Visit: Payer: No Typology Code available for payment source

## 2018-05-09 DIAGNOSIS — M545 Low back pain, unspecified: Secondary | ICD-10-CM

## 2018-05-09 DIAGNOSIS — M6281 Muscle weakness (generalized): Secondary | ICD-10-CM

## 2018-05-09 DIAGNOSIS — G8929 Other chronic pain: Secondary | ICD-10-CM

## 2018-05-09 DIAGNOSIS — R2689 Other abnormalities of gait and mobility: Secondary | ICD-10-CM

## 2018-05-09 DIAGNOSIS — R2681 Unsteadiness on feet: Secondary | ICD-10-CM

## 2018-05-09 NOTE — Therapy (Signed)
[facility] [address], [place], [place], [address] Phone: [phone]   Fax:  [phone]  Physical Therapy Treatment  Patient Details  Name: Jacob [name] MRN: [id number] Date of Birth: Nov 21, 1940 Referring Provider (PT): Annie Sable, MD   Encounter Date: 05/09/2018  PT End of Session - 05/09/18 1416    Visit Number  17    Date for PT Re-Evaluation  05/13/18    Authorization Type  VA    Authorization Time Period  15 additional from 04/12/18-08/11/18    Authorization - Visit Number  2    Authorization - Number of Visits  15    PT Start Time  0962    PT Stop Time  1412    PT Time Calculation (min)  20 min    Activity Tolerance  Patient limited by pain    Behavior During Therapy  [facility] for tasks assessed/performed       Past Medical History:  Diagnosis Date  . Allergy   . Anemia   . Anxiety   . Arthritis   . Asthma   . BPH (benign prostatic hyperplasia)   . Cancer of kidney ([place])   . Cataract   . CHF (congestive heart failure) ([place])   . Chronic kidney disease    chronic  kidney failure  kidney function at 42%  . COPD (chronic obstructive pulmonary disease) ([place])   . Coronary artery disease    CABG  7 bypasses  . Diabetes mellitus without complication ([place])   . GERD (gastroesophageal reflux disease)   . Gout   . Hepatitis    many years ago  . Hyperlipidemia   . Hypertension   . Lumbar disc disease   . Obesity   . Paroxysmal atrial fibrillation (HCC)   . Peripheral neuropathy   . Polymyalgia rheumatica (HCC)    maintained on Prednisone, Plaquenil. Followed by rhuematology every 4 months/[name].  . Shortness of breath dyspnea    with exertion  . Sleep apnea    CPAP   Trying to use    Past Surgical History:  Procedure Laterality Date  . APPENDECTOMY    . CARDIAC CATHETERIZATION  1998, 2000   left  . CATARACT EXTRACTION W/PHACO Right 11/28/2014   Procedure: CATARACT EXTRACTION PHACO AND  INTRAOCULAR LENS PLACEMENT ([place]) RIGHT ;  Surgeon: Marylynn Pearson, MD;  Location: [facility];  Service: Ophthalmology;  Laterality: Right;  . CORONARY ARTERY BYPASS GRAFT  03/17/1995   Wynonia Lawman; followed every six months.  Marland Kitchen HERNIA REPAIR    . LUMBAR LAMINECTOMY/DECOMPRESSION MICRODISCECTOMY N/A 07/30/2015   Procedure: LUMBAR LAMINECTOMY DISCECTOMY ;  Surgeon: Ashok Pall, MD;  Location: [facility];  Service: Neurosurgery;  Laterality: N/A;  LUMBAR LAMINECTOMY DISCECTOMY   . LUMBAR LAMINECTOMY/DECOMPRESSION MICRODISCECTOMY N/A 09/06/2015   Procedure: Redo L3/4 Disectomy;  Surgeon: Ashok Pall, MD;  Location: [facility];  Service: Neurosurgery;  Laterality: N/A;  . PROSTATE SURGERY     TURP at [place].  Marland Kitchen TRANSURETHRAL RESECTION OF PROSTATE      There were no vitals filed for this visit.  Subjective Assessment - 05/09/18 1355    Subjective  I am really having a hard time today.  I was supposed to have injections last week and I couldn't get them due to infection on my Rt arm.  I will see the MD tomorrow to discuss the infection.      Currently in Pain?  Yes    Pain Score  10-Worst pain  ever    Pain Location  Back    Pain Orientation  Right;Left    Pain Descriptors / Indicators  Aching;Constant    Pain Type  Chronic pain    Pain Radiating Towards  Rt leg    Pain Onset  More than a month ago    Pain Frequency  Intermittent    Aggravating Factors   I hurt all the time, activity, standing    Pain Relieving Factors  sitting, rest                       [facility] Adult PT Treatment/Exercise - 05/09/18 0001      Lumbar Exercises: Standing   Other Standing Lumbar Exercises  rows with green TB x10 reps    seated     Knee/Hip Exercises: Aerobic   Nustep  L1 x 5 minutes   PT present to discuss progress and monitor- stopped early      Knee/Hip Exercises: Seated   Long Arc Quad  Strengthening;Both;2 sets;10 reps    Ball Squeeze  x20     Clamshell with [employer]   x20               PT Short Term Goals - 03/14/18 1508      PT SHORT TERM GOAL #1   Title  Pt will be independent with his initial HEP to improve flexibility, strength and balance.     Status  Achieved      PT SHORT TERM GOAL #2   Title  Pt will report walking for atleast 5 min, 3x/week to improve his endurance activity participation.     Status  Achieved      PT SHORT TERM GOAL #3   Title  report  < or = to 6/10 LBP with standing and walking    Baseline  8/10 pain report pt with chronic LBP     Status  Deferred        PT Long Term Goals - 03/14/18 1510      PT LONG TERM GOAL #1   Title  Pt will demo independence with his advanced HEP to futher promote strenght gains after d/c from PT.     Time  8    Period  Weeks    Status  On-going    Target Date  05/05/18      PT LONG TERM GOAL #2   Title  Pt will demo improved functional strength and power of the LEs evident by his ability to complete 5x/sit to stand in less than 14 sec with minimal UE use.     Baseline  14 seconds with min UE support    Time  8    Period  Weeks    Status  Achieved      PT LONG TERM GOAL #3   Title  Pt will have increase in BLE strength to atleast 4/5 MMT which will improve his ability to maintain upright standing posture throughout the day.     Baseline  4/5 MMT knee strength, hip still 3/5 MMT    Time  3    Period  Weeks    Status  Partially Met      PT LONG TERM GOAL #4   Title  Pt will have atleast 6 points increase in his BERG balance score which will indicate a significant improvement in his balance and decrease fall risk.     Baseline  29/56 increased to 39/56  Status  On-going      PT LONG TERM GOAL #5   Title  report < or  = to 5/10 LBP with standing and walking    Baseline  up to 8/10- this varies    Status  Deferred            Plan - 05/09/18 1402    Clinical Impression Statement  Pt came to the clinic today with significant LBP and wanted to try to exercise as much as  possible.  Pt was supposed to have injection last week and this was rescheduled due to arm infection.  Pt declined modalities today.  Pt was able to perform a short period of gentle exercise today with supervision from PT to monitor for pain and safety.  Weights were eliminated today due to pain.  Pt with chronic pain, gait abnormality and functional strength deficits that will benefit from continued PT.      Rehab Potential  Good    PT Frequency  2x / week    PT Duration  6 weeks    PT Treatment/Interventions  ADLs/Self Care Home Management;Moist Heat;Traction;Electrical Stimulation;Cryotherapy;Functional mobility training;Therapeutic activities;Therapeutic exercise;Patient/family education;Manual techniques;Passive range of motion;Neuromuscular re-education;Balance training;Taping;Spinal Manipulations;Dry needling    PT Next Visit Plan  recert needed by 11/09/39.  Exercise as tolerated by pt.  Standing balance exercise as able.      PT Home Exercise Plan   Access Code: [email or web address]     Consulted and Agree with Plan of Care  Patient       Patient will benefit from skilled therapeutic intervention in order to improve the following deficits and impairments:  Abnormal gait, Decreased activity tolerance, Decreased safety awareness, Decreased strength, Impaired flexibility, Postural dysfunction, Pain, Obesity, Improper body mechanics, Decreased range of motion, Decreased endurance, Decreased balance, Decreased mobility, Difficulty walking, Hypomobility, Increased muscle spasms  Visit Diagnosis: Muscle weakness (generalized)  Unsteadiness on feet  Chronic low back pain, unspecified back pain laterality, unspecified whether sciatica present  Other abnormalities of gait and mobility     Problem List Patient Active Problem List   Diagnosis Date Noted  . Hypertensive heart disease 04/24/2018  . CKD (chronic kidney disease) 11/27/2015  . Chronic diastolic heart failure ([place]) 11/27/2015  . Normocytic  anemia 11/27/2015  . AKI (acute kidney injury) ([place])   . Acute kidney injury superimposed on chronic kidney disease ([place]) 10/02/2015  . Atrial fibrillation with RVR ([place]) 10/02/2015  . Hypotension 10/02/2015  . Anorexia   . Adjustment disorder with mixed emotional features 09/01/2015  . Acute encephalopathy 08/29/2015  . Elevated troponin 08/29/2015  . Renal cancer ([place]) 08/28/2015  . Spinal stenosis of lumbar region with radiculopathy 08/07/2015  . COPD (chronic obstructive pulmonary disease) ([place])   . OSA (obstructive sleep apnea) 07/27/2015  . HNP (herniated nucleus pulposus), lumbar 07/24/2015  . PMR (polymyalgia rheumatica) ([place]) 09/09/2013  . Rheumatoid arthritis ([place]) 04/13/2012  . CAD (coronary artery disease) 12/19/2011  . Insulin dependent diabetes mellitus ([place]) 12/19/2011  . Neuropathy ([place]) 12/19/2011    Sigurd Sos, PT 05/09/18 2:18 PM  [facility] [address], [place], [place], [address] Phone: [phone]   Fax:  [phone]  Name: Jacob [name] MRN: [id number] Date of Birth: [birth year]

## 2018-05-11 ENCOUNTER — Ambulatory Visit: Payer: No Typology Code available for payment source

## 2018-05-11 DIAGNOSIS — R2681 Unsteadiness on feet: Secondary | ICD-10-CM

## 2018-05-11 DIAGNOSIS — G8929 Other chronic pain: Secondary | ICD-10-CM

## 2018-05-11 DIAGNOSIS — M545 Low back pain, unspecified: Secondary | ICD-10-CM

## 2018-05-11 DIAGNOSIS — M6281 Muscle weakness (generalized): Secondary | ICD-10-CM

## 2018-05-11 DIAGNOSIS — R2689 Other abnormalities of gait and mobility: Secondary | ICD-10-CM

## 2018-05-11 NOTE — Therapy (Signed)
[facility] [address], [place], [place], [address] Phone: [phone]   Fax:  [phone]  Physical Therapy Treatment  Patient Details  Name: Jacob [name] MRN: [id number] Date of Birth: 06/03/1940 Referring Provider (PT): Annie Sable, MD   Encounter Date: 05/11/2018  PT End of Session - 05/11/18 1547    Visit Number  18    Date for PT Re-Evaluation  07/06/18    Authorization Type  VA    Authorization Time Period  15 additional from 04/12/18-08/11/18    Authorization - Visit Number  3    Authorization - Number of Visits  15    PT Start Time  5102    PT Stop Time  1540    PT Time Calculation (min)  34 min    Activity Tolerance  Patient limited by pain    Behavior During Therapy  [facility] for tasks assessed/performed       Past Medical History:  Diagnosis Date  . Allergy   . Anemia   . Anxiety   . Arthritis   . Asthma   . BPH (benign prostatic hyperplasia)   . Cancer of kidney ([place])   . Cataract   . CHF (congestive heart failure) ([place])   . Chronic kidney disease    chronic  kidney failure  kidney function at 42%  . COPD (chronic obstructive pulmonary disease) ([place])   . Coronary artery disease    CABG  7 bypasses  . Diabetes mellitus without complication ([place])   . GERD (gastroesophageal reflux disease)   . Gout   . Hepatitis    many years ago  . Hyperlipidemia   . Hypertension   . Lumbar disc disease   . Obesity   . Paroxysmal atrial fibrillation (HCC)   . Peripheral neuropathy   . Polymyalgia rheumatica (HCC)    maintained on Prednisone, Plaquenil. Followed by rhuematology every 4 months/[name].  . Shortness of breath dyspnea    with exertion  . Sleep apnea    CPAP   Trying to use    Past Surgical History:  Procedure Laterality Date  . APPENDECTOMY    . CARDIAC CATHETERIZATION  1998, 2000   left  . CATARACT EXTRACTION W/PHACO Right 11/28/2014   Procedure: CATARACT EXTRACTION PHACO AND  INTRAOCULAR LENS PLACEMENT ([place]) RIGHT ;  Surgeon: Marylynn Pearson, MD;  Location: [place];  Service: Ophthalmology;  Laterality: Right;  . CORONARY ARTERY BYPASS GRAFT  03/17/1995   Wynonia Lawman; followed every six months.  Marland Kitchen HERNIA REPAIR    . LUMBAR LAMINECTOMY/DECOMPRESSION MICRODISCECTOMY N/A 07/30/2015   Procedure: LUMBAR LAMINECTOMY DISCECTOMY ;  Surgeon: Ashok Pall, MD;  Location: [place] [facility];  Service: Neurosurgery;  Laterality: N/A;  LUMBAR LAMINECTOMY DISCECTOMY   . LUMBAR LAMINECTOMY/DECOMPRESSION MICRODISCECTOMY N/A 09/06/2015   Procedure: Redo L3/4 Disectomy;  Surgeon: Ashok Pall, MD;  Location: [place] [facility]UR[facility];  Service: Neurosurgery;  Laterality: N/A;  . PROSTATE SURGERY     TURP at [place].  Marland Kitchen TRANSURETHRAL RESECTION OF PROSTATE      There were no vitals filed for this visit.  Subjective Assessment - 05/11/18 1526    Subjective  I will get a lumbar injection 05/26/2018.  I am hurting so much that it is really hard to do my exercises.      Currently in Pain?  Yes    Pain Score  8     Pain Location  Back    Pain Orientation  Right;Left    Pain  Descriptors / Indicators  Aching;Constant    Pain Type  Chronic pain    Pain Onset  More than a month ago    Pain Frequency  Intermittent    Aggravating Factors   i hurt all the time and I can't get comfortable    Pain Relieving Factors  nothing has been helping me         [facility] PT Assessment - 05/11/18 0001      Assessment   Medical Diagnosis  other abnormilites of gait and mobility    Referring Provider (PT)  Annie Sable, MD    Onset Date/Surgical Date  --   2017   Next MD Visit  05/26/2018   for injections     Balance Screen   Has the patient fallen in the past 6 months  No    How many times?  --   no fall- loss of balance.  Rt leg gave out    Has the patient had a decrease in activity level because of a fear of falling?   Yes    Is the patient reluctant to leave their home because of a fear of falling?   Yes      Prior  Function   Level of Independence  Independent with basic ADLs    Vocation Requirements  retired       Associate Professor   Overall Cognitive Status  Within Functional Limits for tasks assessed      Posture/Postural Control   Posture Comments  slouched, forward head and rounded shoulders       Strength   Right Hip Flexion  3+/5    Left Hip Flexion  4/5    Right Knee Extension  4/5    Left Knee Extension  4+/5    Right/Left Ankle  Right;Left    Right Ankle Dorsiflexion  3-/5    Left Ankle Dorsiflexion  4-/5      Transfers   Five time sit to stand comments   23.6 seconds with use of hands on chair      Ambulation/Gait   Ambulation/Gait  Yes    Ambulation/Gait Assistance  5: Supervision    Assistive device  Rolling walker    Gait Pattern  Step-through pattern;Decreased stride length      Balance   Balance Assessed  Yes      Timed Up and Go Test   TUG  Normal TUG    Normal TUG (seconds)  19.7    TUG Comments  with walker                   [facility] Adult PT Treatment/Exercise - 05/11/18 0001      Transfers   Transfers  Sit to Stand;Stand to Sit    Sit to Stand  With upper extremity assist    Stand to Sit  With upper extremity assist      Lumbar Exercises: Standing   Other Standing Lumbar Exercises  rows with green TB 2x10 reps    seated     Knee/Hip Exercises: Seated   Long Arc Quad  Strengthening;Both;2 sets;10 reps    [employer]  x20     Clamshell with [employer]   x20   Marching  Strengthening;Both;2 sets;10 reps      Ankle Exercises: Seated   Heel Raises  20 reps;Both    Toe Raise  20 reps               PT Short Term  Goals - 03/14/18 1508      PT SHORT TERM GOAL #1   Title  Pt will be independent with his initial HEP to improve flexibility, strength and balance.     Status  Achieved      PT SHORT TERM GOAL #2   Title  Pt will report walking for atleast 5 min, 3x/week to improve his endurance activity participation.     Status  Achieved       PT SHORT TERM GOAL #3   Title  report  < or = to 6/10 LBP with standing and walking    Baseline  8/10 pain report pt with chronic LBP     Status  Deferred        PT Long Term Goals - 05/11/18 1515      PT LONG TERM GOAL #1   Title  Pt will demo independence with his advanced HEP to futher promote strenght gains after d/c from PT.     Baseline  not able to exercise due to significant LBP    Time  8    Period  Weeks    Status  On-going    Target Date  07/06/18      PT LONG TERM GOAL #2   Title  Pt will demo improved functional strength and power of the LEs evident by his ability to complete 5x/sit to stand in less than 14 sec with minimal UE use.     Baseline  max use of UEs- 23 seconds.  LBP is limiting this.    Time  8    Period  Weeks    Status  On-going    Target Date  07/06/18      PT LONG TERM GOAL #3   Title  Pt will have increase in BLE strength to atleast 4/5 MMT which will improve his ability to maintain upright standing posture throughout the day.     Baseline  see MMT    Time  8    Period  Weeks    Status  On-going    Target Date  07/06/18      PT LONG TERM GOAL #4   Title  perform TUG in < or = to 14 seconds to reduce falls risk    Baseline  19.7 seconds with walker    Time  8    Period  Weeks    Status  On-going    Target Date  07/06/18      PT LONG TERM GOAL #5   Title  report < or  = to 5/10 LBP with standing and walking    Baseline  up to 8/10 LBP and Rt LE pain- this varies    Time  8    Period  Weeks    Status  On-going    Target Date  07/06/18            Plan - 05/11/18 1543    Clinical Impression Statement  Pt had a significant lapse in treatment from 03/14/18-05/02/2018 awaiting insurance approval.  Pt has had a recent increase in LBP (7-10/10 constant LBP and Rt LE pain) that has limited his ability to participate in exercise at home.  Pt will receive lumbar injections 05/26/2018 and will return to PT after this.  Pt will resume  exercise at home as he is able to maintain strength and improve safety. 5x sit to stand and TUG time indicate significant falls risk and pt with reduced LE strength today.  Pt will return to PT after his injection to resume PT to address chronic balance, gait and strength deficits.        Rehab Potential  Good    PT Frequency  2x / week    PT Duration  8 weeks    PT Treatment/Interventions  ADLs/Self Care Home Management;Moist Heat;Traction;Electrical Stimulation;Cryotherapy;Functional mobility training;Therapeutic activities;Therapeutic exercise;Patient/family education;Manual techniques;Passive range of motion;Neuromuscular re-education;Balance training;Taping;Spinal Manipulations;Dry needling    PT Next Visit Plan  Pt will return to PT after 05/26/18 to resume strength, gait and balance as able    PT Home Exercise Plan   Access Code: [email or web address]     Consulted and Agree with Plan of Care  Patient       Patient will benefit from skilled therapeutic intervention in order to improve the following deficits and impairments:  Abnormal gait, Decreased activity tolerance, Decreased safety awareness, Decreased strength, Impaired flexibility, Postural dysfunction, Pain, Obesity, Improper body mechanics, Decreased range of motion, Decreased endurance, Decreased balance, Decreased mobility, Difficulty walking, Hypomobility, Increased muscle spasms  Visit Diagnosis: Muscle weakness (generalized) - Plan: PT plan of care cert/re-cert  Unsteadiness on feet - Plan: PT plan of care cert/re-cert  Chronic low back pain, unspecified back pain laterality, unspecified whether sciatica present - Plan: PT plan of care cert/re-cert  Other abnormalities of gait and mobility - Plan: PT plan of care cert/re-cert     Problem List Patient Active Problem List   Diagnosis Date Noted  . Hypertensive heart disease 04/24/2018  . CKD (chronic kidney disease) 11/27/2015  . Chronic diastolic heart failure ([place]) 11/27/2015  .  Normocytic anemia 11/27/2015  . AKI (acute kidney injury) ([place])   . Acute kidney injury superimposed on chronic kidney disease ([place]) 10/02/2015  . Atrial fibrillation with RVR ([place]) 10/02/2015  . Hypotension 10/02/2015  . Anorexia   . Adjustment disorder with mixed emotional features 09/01/2015  . Acute encephalopathy 08/29/2015  . Elevated troponin 08/29/2015  . Renal cancer ([place]) 08/28/2015  . Spinal stenosis of lumbar region with radiculopathy 08/07/2015  . COPD (chronic obstructive pulmonary disease) ([place])   . OSA (obstructive sleep apnea) 07/27/2015  . HNP (herniated nucleus pulposus), lumbar 07/24/2015  . PMR (polymyalgia rheumatica) ([place]) 09/09/2013  . Rheumatoid arthritis ([place]) 04/13/2012  . CAD (coronary artery disease) 12/19/2011  . Insulin dependent diabetes mellitus ([place]) 12/19/2011  . Neuropathy ([place]) 12/19/2011    Jacob [name], PT 05/11/18 3:49 PM  [facility] [address], [place], [place], [address] Phone: [phone]   Fax:  [phone]  Name: ROHITH FAUTH MRN: [id number] Date of Birth: [birth year]

## 2018-05-13 DIAGNOSIS — E113293 Type 2 diabetes mellitus with mild nonproliferative diabetic retinopathy without macular edema, bilateral: Secondary | ICD-10-CM | POA: Diagnosis not present

## 2018-05-13 DIAGNOSIS — H353211 Exudative age-related macular degeneration, right eye, with active choroidal neovascularization: Secondary | ICD-10-CM | POA: Diagnosis not present

## 2018-05-13 DIAGNOSIS — H43811 Vitreous degeneration, right eye: Secondary | ICD-10-CM | POA: Diagnosis not present

## 2018-05-16 ENCOUNTER — Telehealth: Payer: Self-pay | Admitting: Cardiology

## 2018-05-16 MED ORDER — AMIODARONE HCL 100 MG PO TABS: 100.0000 mg | ORAL_TABLET | Freq: Every day | ORAL | 1 refills | Status: DC

## 2018-05-16 MED ORDER — AMLODIPINE BESYLATE 10 MG PO TABS: 10.0000 mg | ORAL_TABLET | Freq: Every day | ORAL | 1 refills | Status: DC

## 2018-05-16 NOTE — Telephone Encounter (Signed)
Refill for amiodarone and amlodipine sent to [facility] [place] as requested.

## 2018-05-16 NOTE — Telephone Encounter (Signed)
°*  STAT* If patient is at the pharmacy, call can be transferred to refill team.   1. Which medications need to be refilled? (please list name of each medication and dose if known) Amiodarone 100mg , Amlodipine 10mg ,  2. Which pharmacy/location (including street and city if local pharmacy) is medication to be sent to? [facility] [address] [place]  3. Do they need a 30 day or 90 day supply? [place]

## 2018-05-30 ENCOUNTER — Ambulatory Visit: Payer: No Typology Code available for payment source | Attending: Internal Medicine

## 2018-05-30 ENCOUNTER — Other Ambulatory Visit: Payer: Self-pay

## 2018-05-30 DIAGNOSIS — R2681 Unsteadiness on feet: Secondary | ICD-10-CM | POA: Diagnosis present

## 2018-05-30 DIAGNOSIS — R2689 Other abnormalities of gait and mobility: Secondary | ICD-10-CM | POA: Diagnosis present

## 2018-05-30 DIAGNOSIS — M6281 Muscle weakness (generalized): Secondary | ICD-10-CM | POA: Diagnosis not present

## 2018-05-30 DIAGNOSIS — G8929 Other chronic pain: Secondary | ICD-10-CM | POA: Diagnosis present

## 2018-05-30 DIAGNOSIS — M545 Low back pain, unspecified: Secondary | ICD-10-CM

## 2018-05-30 NOTE — Therapy (Signed)
[facility] [address], [place], [place], [address] Phone: [phone]   Fax:  [phone]  Physical Therapy Treatment  Patient Details  Name: Jacob [name] MRN: [id number] Date of Birth: 08-25-1940 Referring Provider (PT): Annie Sable, MD   Encounter Date: 05/30/2018  PT End of Session - 05/30/18 1433    Visit Number  19    Date for PT Re-Evaluation  07/06/18    Authorization Type  VA    Authorization Time Period  15 additional from 04/12/18-08/11/18    Authorization - Visit Number  4    Authorization - Number of Visits  15    PT Start Time  1601    PT Stop Time  1435    PT Time Calculation (min)  32 min    Activity Tolerance  Patient limited by fatigue;Patient limited by pain    Behavior During Therapy  [facility] for tasks assessed/performed       Past Medical History:  Diagnosis Date  . Allergy   . Anemia   . Anxiety   . Arthritis   . Asthma   . BPH (benign prostatic hyperplasia)   . Cancer of kidney ([place])   . Cataract   . CHF (congestive heart failure) ([place])   . Chronic kidney disease    chronic  kidney failure  kidney function at 42%  . COPD (chronic obstructive pulmonary disease) ([place])   . Coronary artery disease    CABG  7 bypasses  . Diabetes mellitus without complication ([place])   . GERD (gastroesophageal reflux disease)   . Gout   . Hepatitis    many years ago  . Hyperlipidemia   . Hypertension   . Lumbar disc disease   . Obesity   . Paroxysmal atrial fibrillation (HCC)   . Peripheral neuropathy   . Polymyalgia rheumatica (HCC)    maintained on Prednisone, Plaquenil. Followed by rhuematology every 4 months/[name].  . Shortness of breath dyspnea    with exertion  . Sleep apnea    CPAP   Trying to use    Past Surgical History:  Procedure Laterality Date  . APPENDECTOMY    . CARDIAC CATHETERIZATION  1998, 2000   left  . CATARACT EXTRACTION W/PHACO Right 11/28/2014   Procedure:  CATARACT EXTRACTION PHACO AND INTRAOCULAR LENS PLACEMENT ([place]) RIGHT ;  Surgeon: Marylynn Pearson, MD;  Location: [place];  Service: Ophthalmology;  Laterality: Right;  . CORONARY ARTERY BYPASS GRAFT  03/17/1995   Wynonia Lawman; followed every six months.  Marland Kitchen HERNIA REPAIR    . LUMBAR LAMINECTOMY/DECOMPRESSION MICRODISCECTOMY N/A 07/30/2015   Procedure: LUMBAR LAMINECTOMY DISCECTOMY ;  Surgeon: Ashok Pall, MD;  Location: [place] [facility];  Service: Neurosurgery;  Laterality: N/A;  LUMBAR LAMINECTOMY DISCECTOMY   . LUMBAR LAMINECTOMY/DECOMPRESSION MICRODISCECTOMY N/A 09/06/2015   Procedure: Redo L3/4 Disectomy;  Surgeon: Ashok Pall, MD;  Location: [place] [facility];  Service: Neurosurgery;  Laterality: N/A;  . PROSTATE SURGERY     TURP at [place].  Marland Kitchen TRANSURETHRAL RESECTION OF PROSTATE      There were no vitals filed for this visit.  Subjective Assessment - 05/30/18 1404    Subjective  I did the injection and it helped for a few hours.  My back pain is terrible again.      Currently in Pain?  Yes    Pain Score  8     Pain Location  Back    Pain Orientation  Right    Pain Descriptors /  Indicators  Aching;Constant    Pain Onset  More than a month ago    Pain Frequency  Intermittent    Aggravating Factors   I hurt all the time    Pain Relieving Factors  nothing has helped me                       [facility] Adult PT Treatment/Exercise - 05/30/18 0001      Lumbar Exercises: Standing   Other Standing Lumbar Exercises  rows with green TB 2x10 reps    seated     Knee/Hip Exercises: Aerobic   Nustep  L1 x 5 minutes   PT present to discuss progress and monitor- stopped early      Knee/Hip Exercises: Standing   Heel Raises  Both;2 sets;10 reps    Hip Abduction  Stengthening;Both;2 sets;10 reps    Abduction Limitations  2#    Hip Extension  Stengthening;Both;2 sets;10 reps    Extension Limitations  2#      Knee/Hip Exercises: Seated   Long Arc Quad  Strengthening;Both;2 sets;10 reps    Long Arc  Quad Weight  2 lbs.    Ball Squeeze  x20     Clamshell with [employer]   x20   Marching  Strengthening;Both;2 sets;10 reps    Marching Limitations  2#               PT Short Term Goals - 03/14/18 1508      PT SHORT TERM GOAL #1   Title  Pt will be independent with his initial HEP to improve flexibility, strength and balance.     Status  Achieved      PT SHORT TERM GOAL #2   Title  Pt will report walking for atleast 5 min, 3x/week to improve his endurance activity participation.     Status  Achieved      PT SHORT TERM GOAL #3   Title  report  < or = to 6/10 LBP with standing and walking    Baseline  8/10 pain report pt with chronic LBP     Status  Deferred        PT Long Term Goals - 05/11/18 1515      PT LONG TERM GOAL #1   Title  Pt will demo independence with his advanced HEP to futher promote strenght gains after d/c from PT.     Baseline  not able to exercise due to significant LBP    Time  8    Period  Weeks    Status  On-going    Target Date  07/06/18      PT LONG TERM GOAL #2   Title  Pt will demo improved functional strength and power of the LEs evident by his ability to complete 5x/sit to stand in less than 14 sec with minimal UE use.     Baseline  max use of UEs- 23 seconds.  LBP is limiting this.    Time  8    Period  Weeks    Status  On-going    Target Date  07/06/18      PT LONG TERM GOAL #3   Title  Pt will have increase in BLE strength to atleast 4/5 MMT which will improve his ability to maintain upright standing posture throughout the day.     Baseline  see MMT    Time  8    Period  Weeks  Status  On-going    Target Date  07/06/18      PT LONG TERM GOAL #4   Title  perform TUG in < or = to 14 seconds to reduce falls risk    Baseline  19.7 seconds with walker    Time  8    Period  Weeks    Status  On-going    Target Date  07/06/18      PT LONG TERM GOAL #5   Title  report < or  = to 5/10 LBP with standing and walking     Baseline  up to 8/10 LBP and Rt LE pain- this varies    Time  8    Period  Weeks    Status  On-going    Target Date  07/06/18            Plan - 05/30/18 1416    Clinical Impression Statement  Pt with lapse in treatment since last session.  Pt had injection into lumbar spine 3 weeks ago and denies any relief from it.  Pt with improved ability to participate in exercise today.  Pt required stand by assistance with standing exercise for safety.  Pt with significant endurance limitations and gait abnormality of a chronic nature and will benefit from skilled PT for strength, endurance and balance training for improved safety and independence with mobility.      Rehab Potential  Good    PT Frequency  2x / week    PT Duration  8 weeks    PT Treatment/Interventions  ADLs/Self Care Home Management;Moist Heat;Traction;Electrical Stimulation;Cryotherapy;Functional mobility training;Therapeutic activities;Therapeutic exercise;Patient/family education;Manual techniques;Passive range of motion;Neuromuscular re-education;Balance training;Taping;Spinal Manipulations;Dry needling    PT Next Visit Plan  Strength, gait, balance    PT Home Exercise Plan   Access Code: [email or web address]     Consulted and Agree with Plan of Care  Patient       Patient will benefit from skilled therapeutic intervention in order to improve the following deficits and impairments:  Abnormal gait, Decreased activity tolerance, Decreased safety awareness, Decreased strength, Impaired flexibility, Postural dysfunction, Pain, Obesity, Improper body mechanics, Decreased range of motion, Decreased endurance, Decreased balance, Decreased mobility, Difficulty walking, Hypomobility, Increased muscle spasms  Visit Diagnosis: Muscle weakness (generalized)  Unsteadiness on feet  Chronic low back pain, unspecified back pain laterality, unspecified whether sciatica present  Other abnormalities of gait and mobility     Problem List Patient  Active Problem List   Diagnosis Date Noted  . Hypertensive heart disease 04/24/2018  . CKD (chronic kidney disease) 11/27/2015  . Chronic diastolic heart failure ([place]) 11/27/2015  . Normocytic anemia 11/27/2015  . AKI (acute kidney injury) ([place])   . Acute kidney injury superimposed on chronic kidney disease ([place]) 10/02/2015  . Atrial fibrillation with RVR ([place]) 10/02/2015  . Hypotension 10/02/2015  . Anorexia   . Adjustment disorder with mixed emotional features 09/01/2015  . Acute encephalopathy 08/29/2015  . Elevated troponin 08/29/2015  . Renal cancer ([place]) 08/28/2015  . Spinal stenosis of lumbar region with radiculopathy 08/07/2015  . COPD (chronic obstructive pulmonary disease) ([place])   . OSA (obstructive sleep apnea) 07/27/2015  . HNP (herniated nucleus pulposus), lumbar 07/24/2015  . PMR (polymyalgia rheumatica) ([place]) 09/09/2013  . Rheumatoid arthritis ([place]) 04/13/2012  . CAD (coronary artery disease) 12/19/2011  . Insulin dependent diabetes mellitus ([place]) 12/19/2011  . Neuropathy ([place]) 12/19/2011   Sigurd Sos, PT 05/30/18 2:36 PM  [facility] [address]  [place], [place], [place], [address] Phone: [phone]   Fax:  [phone]  Name: Jacob [name] MRN: [id number] Date of Birth: 02-15-1941

## 2018-06-01 ENCOUNTER — Ambulatory Visit: Payer: No Typology Code available for payment source

## 2018-06-01 ENCOUNTER — Other Ambulatory Visit: Payer: Self-pay

## 2018-06-01 DIAGNOSIS — M6281 Muscle weakness (generalized): Secondary | ICD-10-CM

## 2018-06-01 DIAGNOSIS — R2689 Other abnormalities of gait and mobility: Secondary | ICD-10-CM

## 2018-06-01 DIAGNOSIS — G8929 Other chronic pain: Secondary | ICD-10-CM

## 2018-06-01 DIAGNOSIS — M545 Low back pain, unspecified: Secondary | ICD-10-CM

## 2018-06-01 DIAGNOSIS — R2681 Unsteadiness on feet: Secondary | ICD-10-CM

## 2018-06-01 NOTE — Therapy (Addendum)
[facility] [address], [place], [place], [address] Phone: [phone]   Fax:  [phone]  Physical Therapy Treatment  Patient Details  Name: Jacob [name] MRN: [id number] Date of Birth: 07-27-1940 Referring Provider (PT): Annie Sable, MD   Encounter Date: 06/01/2018 Progress Note Reporting Period 02/17/19 to 06/01/18  See note below for Objective Data and Assessment of Progress/Goals.      PT End of Session - 06/01/18 1428    Visit Number  20    Date for PT Re-Evaluation  07/06/18    Authorization Type  [facility]    Authorization Time Period  15 additional from 04/12/18-08/11/18    Authorization - Visit Number  5    Authorization - Number of Visits  15    PT Start Time  1400    PT Stop Time  1428    PT Time Calculation (min)  28 min    Activity Tolerance  Patient limited by fatigue;Patient limited by pain    Behavior During Therapy  [facility] for tasks assessed/performed       Past Medical History:  Diagnosis Date  . Allergy   . Anemia   . Anxiety   . Arthritis   . Asthma   . BPH (benign prostatic hyperplasia)   . Cancer of kidney ([place])   . Cataract   . CHF (congestive heart failure) ([place])   . Chronic kidney disease    chronic  kidney failure  kidney function at 42%  . COPD (chronic obstructive pulmonary disease) ([place])   . Coronary artery disease    CABG  7 bypasses  . Diabetes mellitus without complication ([place])   . GERD (gastroesophageal reflux disease)   . Gout   . Hepatitis    many years ago  . Hyperlipidemia   . Hypertension   . Lumbar disc disease   . Obesity   . Paroxysmal atrial fibrillation (HCC)   . Peripheral neuropathy   . Polymyalgia rheumatica (HCC)    maintained on Prednisone, Plaquenil. Followed by rhuematology every 4 months/[name].  . Shortness of breath dyspnea    with exertion  . Sleep apnea    CPAP   Trying to use    Past Surgical History:  Procedure Laterality Date  .  APPENDECTOMY    . CARDIAC CATHETERIZATION  1998, 2000   left  . CATARACT EXTRACTION W/PHACO Right 11/28/2014   Procedure: CATARACT EXTRACTION PHACO AND INTRAOCULAR LENS PLACEMENT ([place]) RIGHT ;  Surgeon: Marylynn Pearson, MD;  Location: [facility];  Service: Ophthalmology;  Laterality: Right;  . CORONARY ARTERY BYPASS GRAFT  03/17/1995   Wynonia Lawman; followed every six months.  Marland Kitchen HERNIA REPAIR    . LUMBAR LAMINECTOMY/DECOMPRESSION MICRODISCECTOMY N/A 07/30/2015   Procedure: LUMBAR LAMINECTOMY DISCECTOMY ;  Surgeon: Ashok Pall, MD;  Location: [facility];  Service: Neurosurgery;  Laterality: N/A;  LUMBAR LAMINECTOMY DISCECTOMY   . LUMBAR LAMINECTOMY/DECOMPRESSION MICRODISCECTOMY N/A 09/06/2015   Procedure: Redo L3/4 Disectomy;  Surgeon: Ashok Pall, MD;  Location: [facility];  Service: Neurosurgery;  Laterality: N/A;  . PROSTATE SURGERY     TURP at [place].  Marland Kitchen TRANSURETHRAL RESECTION OF PROSTATE      There were no vitals filed for this visit.  Subjective Assessment - 06/01/18 1357    Subjective  I am afraid the injection made things worse.      Currently in Pain?  Yes    Pain Score  8     Pain Location  Back  Pain Orientation  Right    Pain Descriptors / Indicators  Aching;Constant    Pain Type  Chronic pain         [facility] PT Assessment - 06/01/18 0001      Assessment   Medical Diagnosis  other abnormilites of gait and mobility    Referring Provider (PT)  Annie Sable, MD    Next MD Visit  05/26/2018   for injections     Prior Function   Level of Independence  Independent with basic ADLs    Vocation Requirements  retired       Associate Professor   Overall Cognitive Status  Within Functional Limits for tasks assessed      Strength   Right Hip Flexion  3+/5    Left Hip Flexion  4/5    Right Knee Extension  4/5    Left Knee Extension  4+/5    Right Ankle Dorsiflexion  3-/5    Left Ankle Dorsiflexion  4-/5      Transfers   Transfers  Sit to Stand;Stand to Sit    Sit to Stand  With upper  extremity assist    Five time sit to stand comments   23.6 seconds with use of hands on chair    Stand to Sit  With upper extremity assist      Ambulation/Gait   Ambulation/Gait  Yes    Ambulation/Gait Assistance  5: Supervision    Assistive device  Straight cane    Gait Pattern  Step-through pattern;Decreased stride length                   [facility] Adult PT Treatment/Exercise - 06/01/18 0001      Lumbar Exercises: Standing   Other Standing Lumbar Exercises  rows with green TB 2x10 reps    seated     Knee/Hip Exercises: Aerobic   Nustep  L1 x 6 minutes   PT present to discuss progress and monitor- stopped early      Knee/Hip Exercises: Standing   Hip Abduction  Stengthening;Both;2 sets;10 reps    Abduction Limitations  2#    Hip Extension  Stengthening;Both;2 sets;10 reps    Extension Limitations  2#      Knee/Hip Exercises: Seated   Long Arc Quad  Strengthening;Both;2 sets;10 reps    Long Arc Quad Weight  2 lbs.    Ball Squeeze  x20     Clamshell with [employer]   x20   Marching  Strengthening;Both;2 sets;10 reps    Marching Limitations  2#               PT Short Term Goals - 03/14/18 1508      PT SHORT TERM GOAL #1   Title  Pt will be independent with his initial HEP to improve flexibility, strength and balance.     Status  Achieved      PT SHORT TERM GOAL #2   Title  Pt will report walking for atleast 5 min, 3x/week to improve his endurance activity participation.     Status  Achieved      PT SHORT TERM GOAL #3   Title  report  < or = to 6/10 LBP with standing and walking    Baseline  8/10 pain report pt with chronic LBP     Status  Deferred        PT Long Term Goals - 06/01/18 1433      PT LONG TERM GOAL #1  Title  Pt will demo independence with his advanced HEP to futher promote strenght gains after d/c from PT.     Baseline  using foot cycle and doing strength exercises     Time  8    Period  Weeks    Status  On-going       PT LONG TERM GOAL #2   Title  Pt will demo improved functional strength and power of the LEs evident by his ability to complete 5x/sit to stand in less than 14 sec with minimal UE use.     Baseline  max use of UEs- 23 seconds.  LBP is limiting this.    Time  8    Period  Weeks    Status  On-going      PT LONG TERM GOAL #3   Title  Pt will have increase in BLE strength to atleast 4/5 MMT which will improve his ability to maintain upright standing posture throughout the day.     Baseline  see MMT    Time  8    Period  Weeks    Status  On-going      PT LONG TERM GOAL #4   Title  perform TUG in < or = to 14 seconds to reduce falls risk    Baseline  19.7 seconds with walker    Time  8    Period  Weeks    Status  On-going      PT LONG TERM GOAL #5   Title  report < or  = to 5/10 LBP with standing and walking    Baseline  up to 8/10 LBP and Rt LE pain- this varies    Time  8    Period  Weeks    Status  On-going            Plan - 06/01/18 1406    Clinical Impression Statement  Pt with 8/10 reported LBP today and wanted to try exercising as much as able.  Pt is not able to do anything to relieve the pain.  Pt plans to call MD to discuss lack of pain of reduction with injection.  Pt with reduced ability to stand for exercise and required rest breaks due to pain.  PT monitored pt for pain symptoms with exercise.  Pt would like to continue to PT and will benefit from skilled PT to improve strength to improve stability with gait.      Rehab Potential  Good    PT Frequency  2x / week    PT Duration  8 weeks    PT Treatment/Interventions  ADLs/Self Care Home Management;Moist Heat;Traction;Electrical Stimulation;Cryotherapy;Functional mobility training;Therapeutic activities;Therapeutic exercise;Patient/family education;Manual techniques;Passive range of motion;Neuromuscular re-education;Balance training;Taping;Spinal Manipulations;Dry needling    PT Next Visit Plan  Strength, gait, balance     PT Home Exercise Plan   Access Code: [email or web address]     Consulted and Agree with Plan of Care  Patient       Patient will benefit from skilled therapeutic intervention in order to improve the following deficits and impairments:  Abnormal gait, Decreased activity tolerance, Decreased safety awareness, Decreased strength, Impaired flexibility, Postural dysfunction, Pain, Obesity, Improper body mechanics, Decreased range of motion, Decreased endurance, Decreased balance, Decreased mobility, Difficulty walking, Hypomobility, Increased muscle spasms  Visit Diagnosis: Muscle weakness (generalized)  Unsteadiness on feet  Chronic low back pain, unspecified back pain laterality, unspecified whether sciatica present  Other abnormalities of gait and mobility     Problem List  Patient Active Problem List   Diagnosis Date Noted  . Hypertensive heart disease 04/24/2018  . CKD (chronic kidney disease) 11/27/2015  . Chronic diastolic heart failure ([place]) 11/27/2015  . Normocytic anemia 11/27/2015  . AKI (acute kidney injury) ([place])   . Acute kidney injury superimposed on chronic kidney disease ([place]) 10/02/2015  . Atrial fibrillation with RVR ([place]) 10/02/2015  . Hypotension 10/02/2015  . Anorexia   . Adjustment disorder with mixed emotional features 09/01/2015  . Acute encephalopathy 08/29/2015  . Elevated troponin 08/29/2015  . Renal cancer ([place]) 08/28/2015  . Spinal stenosis of lumbar region with radiculopathy 08/07/2015  . COPD (chronic obstructive pulmonary disease) ([place])   . OSA (obstructive sleep apnea) 07/27/2015  . HNP (herniated nucleus pulposus), lumbar 07/24/2015  . PMR (polymyalgia rheumatica) ([place]) 09/09/2013  . Rheumatoid arthritis ([place]) 04/13/2012  . CAD (coronary artery disease) 12/19/2011  . Insulin dependent diabetes mellitus ([place]) 12/19/2011  . Neuropathy ([place]) 12/19/2011   Sigurd Sos, PT 06/01/18 2:37 PM PHYSICAL THERAPY DISCHARGE SUMMARY  Visits from Start of Care:  20  Current functional level related to goals / functional outcomes: See above for most current status.  Pt agreed to D/C due to clinic closure due to [place].  Pt has comprehensive HEP and performs as able when pain in low back isn't too high.     Remaining deficits: See above.  Pt with chronic pain that limits functional mobility and functional strength and balance deficits.  Pt has HEP.     Education / Equipment: HEP Plan: Patient agrees to discharge.  Patient goals were partially met. Patient is being discharged due to                                                     Clinic is closed due to [place] and pt will perform HEP.  ?????          Sigurd Sos, PT 06/24/18 11:21 AM  [facility] [address], [place], [place], [address] Phone: [phone]   Fax:  [phone]  Name: MAKO PELFREY MRN: [id number] Date of Birth: 1940-09-08

## 2018-06-15 ENCOUNTER — Encounter: Payer: Self-pay | Admitting: Physical Therapy

## 2018-06-24 ENCOUNTER — Telehealth: Payer: Self-pay

## 2018-06-24 NOTE — Telephone Encounter (Signed)
Jacob [name] was contacted today regarding temporary reduction of Outpatient Rehabilitation Services due to concerns for community transmission of COVID-19.  Patient identity was verified.  Assessed if patient needed to be seen in person by clinician (recent fall or acute injury that requires hands on assessment and advice, change in diet order, post-surgical, special cases, etc.).     Patient did not have an acute/special need that requires in person visit. Proceeded with phone call.  Therapist advised the patient to continue to perform his/her HEP and assured he/she had no unanswered questions or concerns at this time.  Pt agreed to D/C at this time to HEP and will as MD for new referral if needed in the furture.    Patient is aware we can be reached by telephone during limited business hours in the meantime. [phone])

## 2018-06-27 ENCOUNTER — Encounter: Payer: Self-pay | Admitting: Physical Therapy

## 2018-07-22 ENCOUNTER — Encounter: Payer: Self-pay | Admitting: Cardiology

## 2018-08-22 DIAGNOSIS — Z7901 Long term (current) use of anticoagulants: Secondary | ICD-10-CM

## 2018-08-22 DIAGNOSIS — Z79899 Other long term (current) drug therapy: Secondary | ICD-10-CM | POA: Insufficient documentation

## 2018-08-22 DIAGNOSIS — I480 Paroxysmal atrial fibrillation: Secondary | ICD-10-CM | POA: Insufficient documentation

## 2018-08-22 HISTORY — DX: Other long term (current) drug therapy: Z79.899

## 2018-08-22 HISTORY — DX: Long term (current) use of anticoagulants: Z79.01

## 2018-08-22 NOTE — Progress Notes (Signed)
Cardiology Office Note:    Date:  08/23/2018   ID:  Jacob [name], DOB [birth year], MRN [id number]  PCP:  [facility]  Cardiologist:  Shirlee More, MD    Referring MD: [facility]    ASSESSMENT:    1. Coronary artery disease of native artery of native heart with stable angina pectoris ([place])   2. Chronic diastolic heart failure ([place])   3. Hypertensive heart disease with chronic diastolic congestive heart failure (HCC)   4. Paroxysmal atrial fibrillation ([place])   5. Chronic anticoagulation   6. On amiodarone therapy   7. Chronic obstructive pulmonary disease, unspecified COPD type ([place])   8. Stage 4 chronic kidney disease (HCC)    PLAN:    In order of problems listed above: His primary problem today is an abnormal chest x-ray with concerns of worsening heart failure pericardial effusion advised echocardiogram at the [facility] hospital be ordered and I will contact the patient with results.  Clinically does not appear to have significant pericardial effusion pericarditis or decompensated heart failure 1. CAD -stable New York Heart Association class I continue medical therapy and resume lipid-lowering treatment 2. Heart failure -chronic diastolic compensated check labs including proBNP and echocardiogram 3. Hypertensive heart disease with chronic diastolic congestive heart failure -stable BP at target CKD stage IV stable 4. PAF -stable maintain amiodarone check liver function thyroid and his anticoagulant 5. High risk medication usage - Amiodarone 6. Chronic anticoagulation -see above 7. COPD -stable after exacerbation responding to antibiotic and steroid   Next appointment: 6 months   Medication Adjustments/Labs and Tests Ordered: Current medicines are reviewed at length with the patient today.  Concerns regarding medicines are outlined above.  No orders of the defined types were placed in this encounter.  No orders of the defined types were placed in this  encounter.   Chief Complaint  Patient presents with  . Follow-up  . Coronary Artery Disease  . Congestive Heart Failure  . Atrial Fibrillation    on amiodarone/anticoagulated  . Hypertension  . Hyperlipidemia  . Chronic Kidney Disease    History of Present Illness:    Jacob [name] is a 78 y.o. male with a hx of CAD s/p CABGx7 1997, DM2, HTN, HF, PAF on amiodarone & chronic anticoagulation, CKD, COPD, OSA last seen by me 04/25/18.  Echo 11/29/15: mild LVH, EF 40-45%, diffuse hyopkinesia, mod LAE, mild MR  Compliance with diet, lifestyle and medications: Yes  He is here on directions from the [place] hospital but a month ago he had a cough some shortness of breath chest x-ray treated with Z-Pak and steroids and recovered subsequent this he had an in person visit was told his chest x-ray is abnormal concerns about fluid around his heart or heart failure and advised to get an echocardiogram report back to my office.  Presently no edema shortness of breath chest pain palpitation or syncope is not having pleuritic chest pain and no fever or chills.  Recently there is been some disagreement between his primary care and nephrologist and his statin was discontinued.  I asked him to resume his high intensity statin 2 days a week with stage IV CKD.  He is unsure if labs are being done to follow his amiodarone and and when to take responsibility to perform it in my office he no longer gets a copy of his lab work mailed to him from the [facility] hospital.  Nephrology tells him his CKD is stable stage IV Past  Medical History:  Diagnosis Date  . Allergy   . Anemia   . Anxiety   . Arthritis   . Asthma   . BPH (benign prostatic hyperplasia)   . Cancer of kidney ([place])   . Cataract   . CHF (congestive heart failure) ([place])   . Chronic kidney disease    chronic  kidney failure  kidney function at 42%  . COPD (chronic obstructive pulmonary disease) ([place])   . Coronary artery disease    CABG  7 bypasses  .  Diabetes mellitus without complication ([place])   . GERD (gastroesophageal reflux disease)   . Gout   . Hepatitis    many years ago  . Hyperlipidemia   . Hypertension   . Lumbar disc disease   . Obesity   . Paroxysmal atrial fibrillation (HCC)   . Peripheral neuropathy   . Polymyalgia rheumatica (HCC)    maintained on Prednisone, Plaquenil. Followed by rhuematology every 4 months/[name].  . Shortness of breath dyspnea    with exertion  . Sleep apnea    CPAP   Trying to use    Past Surgical History:  Procedure Laterality Date  . APPENDECTOMY    . CARDIAC CATHETERIZATION  1998, 2000   left  . CATARACT EXTRACTION W/PHACO Right 11/28/2014   Procedure: CATARACT EXTRACTION PHACO AND INTRAOCULAR LENS PLACEMENT ([place]) RIGHT ;  Surgeon: Marylynn Pearson, MD;  Location: [facility];  Service: Ophthalmology;  Laterality: Right;  . CORONARY ARTERY BYPASS GRAFT  03/17/1995   Wynonia Lawman; followed every six months.  Marland Kitchen HERNIA REPAIR    . LUMBAR LAMINECTOMY/DECOMPRESSION MICRODISCECTOMY N/A 07/30/2015   Procedure: LUMBAR LAMINECTOMY DISCECTOMY ;  Surgeon: Ashok Pall, MD;  Location: [facility];  Service: Neurosurgery;  Laterality: N/A;  LUMBAR LAMINECTOMY DISCECTOMY   . LUMBAR LAMINECTOMY/DECOMPRESSION MICRODISCECTOMY N/A 09/06/2015   Procedure: Redo L3/4 Disectomy;  Surgeon: Ashok Pall, MD;  Location: [facility];  Service: Neurosurgery;  Laterality: N/A;  . PROSTATE SURGERY     TURP at [place].  Marland Kitchen TRANSURETHRAL RESECTION OF PROSTATE      Current Medications: Current Meds  Medication Sig  . albuterol (PROVENTIL HFA;VENTOLIN HFA) 108 (90 BASE) MCG/ACT inhaler Inhale 2 puffs into the lungs every 6 (six) hours as needed for wheezing or shortness of breath.  Marland Kitchen amiodarone (PACERONE) 200 MG tablet Take 100 mg by mouth daily.  Marland Kitchen amLODipine (NORVASC) 10 MG tablet Take 1 tablet (10 mg total) by mouth daily.  Marland Kitchen apixaban (ELIQUIS) 5 MG TABS tablet Take 5 mg by mouth 2 (two) times daily.  . budesonide-formoterol  (SYMBICORT) 160-4.5 MCG/ACT inhaler Inhale 2 puffs into the lungs 2 (two) times daily.  . calcitRIOL (ROCALTROL) 0.25 MCG capsule Take 0.25 mcg by mouth every Monday, Wednesday, and Friday.  . cefUROXime (CEFTIN) 250 MG tablet Take 250 mg by mouth 2 (two) times daily as needed.  . chlorpheniramine (CHLOR-TRIMETON) 4 MG tablet Take 4 mg by mouth at bedtime.  . cyanocobalamin 1000 MCG tablet Take 1,000 mcg by mouth daily.  Marland Kitchen denosumab (PROLIA) 60 MG/ML SOSY injection Inject 60 mg into the skin every 6 (six) months.  . ferrous sulfate 325 (65 FE) MG tablet Take 325 mg by mouth as directed. MWF  . finasteride (PROSCAR) 5 MG tablet Take 5 mg by mouth at bedtime.   . furosemide (LASIX) 40 MG tablet Take 40 mg by mouth daily.  . hydrALAZINE (APRESOLINE) 25 MG tablet Take 25 mg by mouth 2 (two) times daily.  . insulin  aspart protamine- aspart (NOVOLOG MIX 70/30) (70-30) 100 UNIT/ML injection Inject into the skin as directed.   . latanoprost (XALATAN) 0.005 % ophthalmic solution Place 1 drop into both eyes at bedtime.  Marland Kitchen loratadine (CLARITIN) 10 MG tablet Take 10 mg by mouth daily.  . metoprolol tartrate (LOPRESSOR) 100 MG tablet Take 100 mg by mouth 2 (two) times daily.  . montelukast (SINGULAIR) 10 MG tablet Take 1 tablet (10 mg total) by mouth at bedtime.  . Multiple Vitamins-Minerals (PRESERVISION AREDS 2 PO) Take 2 tablets by mouth daily.   . nitroGLYCERIN (NITROSTAT) 0.4 MG SL tablet Place 0.4 mg under the tongue every 5 (five) minutes as needed for chest pain.  Marland Kitchen oxymetazoline (AFRIN) 0.05 % nasal spray Place 2 sprays into both nostrils at bedtime.  . pantoprazole (PROTONIX) 40 MG tablet Take 40 mg by mouth 2 (two) times daily.   . pilocarpine (PILOCAR) 4 % ophthalmic solution Place 1 drop into both eyes 3 (three) times daily.   . predniSONE (DELTASONE) 5 MG tablet Take 5 mg by mouth daily with breakfast.   . pregabalin (LYRICA) 75 MG capsule Take 75 mg by mouth 2 (two) times daily.  Marland Kitchen  SEMAGLUTIDE,0.25 OR 0.5MG /DOS, [place] Inject 1.5 mLs into the skin once a week. 0.5mg /0.364ml  . Tamsulosin HCl (FLOMAX) 0.4 MG CAPS Take 0.4 mg by mouth 2 (two) times daily.      Allergies:   Ace inhibitors; Actonel [risedronate]; Ciprocinonide [fluocinolone]; Flunisolide; Metformin and related; Sertraline; Sulindac; and Terazosin   Social History   Socioeconomic History  . Marital status: Married    Spouse name: Not on file  . Number of children: Not on file  . Years of education: Not on file  . Highest education level: Not on file  Occupational History  . Occupation: Diplomatic Services operational officer  . Financial resource strain: Not on file  . Food insecurity:    Worry: Not on file    Inability: Not on file  . Transportation needs:    Medical: Not on file    Non-medical: Not on file  Tobacco Use  . Smoking status: Former Smoker    Types: Cigarettes    Last attempt to quit: 03/16/1957    Years since quitting: 61.4  . Smokeless tobacco: Never Used  Substance and Sexual Activity  . Alcohol use: No  . Drug use: No  . Sexual activity: Not Currently    Birth control/protection: None  Lifestyle  . Physical activity:    Days per week: Not on file    Minutes per session: Not on file  . Stress: Not on file  Relationships  . Social connections:    Talks on phone: Not on file    Gets together: Not on file    Attends religious service: Not on file    Active member of club or organization: Not on file    Attends meetings of clubs or organizations: Not on file    Relationship status: Not on file  Other Topics Concern  . Not on file  Social History Narrative   Marital status: Married x 55 years.      Children: 3 children; 5 grandchildren; 6 gg.      Lives: with wife.        Employment: retired; [employer].  Working every other week; 45 hours.      Tobacco:  Quit at age 47.      Alcohol:  None      Exercise: none  ADLs: indepedent with ADLs;  Uses cane and has a  walker.  Drives.      Advanced Directives:  +Living Will.  DNR/DNI.  Wife is HCPOA.     Family History: The patient's family history includes Diabetes in his brother; Hypertension in an other family member; Kidney failure in his brother. ROS:   Please see the history of present illness.    All other systems reviewed and are negative.  EKGs/Labs/Other Studies Reviewed:    The following studies were reviewed today:  EKG:  EKG ordered today and personally reviewed.  The ekg ordered today demonstrates [place] RBBB LAD old inferior MI  Recent Labs: Recent Labs:   10/18/2017 creatinine 2.31 GFR 26 cc stage IV TSH mildly elevated at 5.05     Physical Exam:    VS:  BP (!) 142/72 (BP Location: Right Arm, Patient Position: Sitting, Cuff Size: Large)   Pulse 62   Temp (!) 97.5 F (36.4 C)   Ht 5\' 11"  (1.803 m)   Wt 222 lb (100.7 kg)   SpO2 95%   BMI 30.96 kg/m     Wt Readings from Last 3 Encounters:  08/23/18 222 lb (100.7 kg)  04/25/18 221 lb (100.2 kg)  12/19/15 170 lb 12.8 oz (77.5 kg)     GEN:  Well nourished, well developed in no acute distress HEENT: Normal NECK: No JVD; No carotid bruits LYMPHATICS: No lymphadenopathy CARDIAC: RRR, no murmurs, rubs, gallops RESPIRATORY:  Clear to auscultation without rales, wheezing or rhonchi  ABDOMEN: Soft, non-tender, non-distended MUSCULOSKELETAL:  No edema; No deformity  SKIN: Warm and dry NEUROLOGIC:  Alert and oriented x 3 PSYCHIATRIC:  Normal affect    Signed, Shirlee More, MD  08/23/2018 10:37 AM    [place]

## 2018-08-23 ENCOUNTER — Encounter: Payer: Self-pay | Admitting: Cardiology

## 2018-08-23 ENCOUNTER — Other Ambulatory Visit: Payer: Self-pay

## 2018-08-23 ENCOUNTER — Ambulatory Visit (INDEPENDENT_AMBULATORY_CARE_PROVIDER_SITE_OTHER): Payer: PPO | Admitting: Cardiology

## 2018-08-23 VITALS — BP 142/72 | HR 62 | Temp 97.5°F | Ht 71.0 in | Wt 222.0 lb

## 2018-08-23 DIAGNOSIS — N184 Chronic kidney disease, stage 4 (severe): Secondary | ICD-10-CM

## 2018-08-23 DIAGNOSIS — I5032 Chronic diastolic (congestive) heart failure: Secondary | ICD-10-CM

## 2018-08-23 DIAGNOSIS — Z79899 Other long term (current) drug therapy: Secondary | ICD-10-CM | POA: Diagnosis not present

## 2018-08-23 DIAGNOSIS — I110 Hypertensive heart disease with heart failure: Secondary | ICD-10-CM | POA: Diagnosis not present

## 2018-08-23 DIAGNOSIS — Z7901 Long term (current) use of anticoagulants: Secondary | ICD-10-CM | POA: Diagnosis not present

## 2018-08-23 DIAGNOSIS — I25118 Atherosclerotic heart disease of native coronary artery with other forms of angina pectoris: Secondary | ICD-10-CM

## 2018-08-23 DIAGNOSIS — I480 Paroxysmal atrial fibrillation: Secondary | ICD-10-CM

## 2018-08-23 MED ORDER — ATORVASTATIN CALCIUM 20 MG PO TABS: ORAL_TABLET | ORAL | 3 refills | Status: DC

## 2018-08-23 NOTE — Patient Instructions (Signed)
Medication Instructions:  Your physician has recommended you make the following change in your medication:  START: Lipitor 20 mg (1 tab) 2 times a week  If you need a refill on your cardiac medications before your next appointment, please call your pharmacy.   Lab work: Your physician recommends that you return for lab work VZ:CHYIF CMP,Pro BNP,TSH,Lipid   If you have labs (blood work) drawn today and your tests are completely normal, you will receive your results only by: Marland Kitchen MyChart Message (if you have MyChart) OR . A paper copy in the mail If you have any lab test that is abnormal or we need to change your treatment, we will call you to review the results.  Testing/Procedures: Your physician has requested that you have an echocardiogram. Echocardiography is a painless test that uses sound waves to create images of your heart. It provides your doctor with information about the size and shape of your heart and how well your heart's chambers and valves are working. This procedure takes approximately one hour. There are no restrictions for this procedure.  YOUR APPOINTMENT IS June 15,2020 AT 7:15  Follow-Up: At [facility], you and your health needs are our priority.  As part of our continuing mission to provide you with exceptional heart care, we have created designated Provider Care Teams.  These Care Teams include your primary Cardiologist (physician) and Advanced Practice Providers (APPs -  Physician Assistants and Nurse Practitioners) who all work together to provide you with the care you need, when you need it. You will need a follow up appointment in 6 months.   Any Other Special Instructions Will Be Listed Below (If Applicable).   Echocardiogram An echocardiogram is a procedure that uses painless sound waves (ultrasound) to produce an image of the heart. Images from an echocardiogram can provide important information about:  Signs of coronary artery disease (CAD).  Aneurysm  detection. An aneurysm is a weak or damaged part of an artery wall that bulges out from the normal force of blood pumping through the body.  Heart size and shape. Changes in the size or shape of the heart can be associated with certain conditions, including heart failure, aneurysm, and CAD.  Heart muscle function.  Heart valve function.  Signs of a past heart attack.  Fluid buildup around the heart.  Thickening of the heart muscle.  A tumor or infectious growth around the heart valves. Tell a health care provider about:  Any allergies you have.  All medicines you are taking, including vitamins, herbs, eye drops, creams, and over-the-counter medicines.  Any blood disorders you have.  Any surgeries you have had.  Any medical conditions you have.  Whether you are pregnant or may be pregnant. What are the risks? Generally, this is a safe procedure. However, problems may occur, including:  Allergic reaction to dye (contrast) that may be used during the procedure. What happens before the procedure? No specific preparation is needed. You may eat and drink normally. What happens during the procedure?   An IV tube may be inserted into one of your veins.  You may receive contrast through this tube. A contrast is an injection that improves the quality of the pictures from your heart.  A gel will be applied to your chest.  A wand-like tool (transducer) will be moved over your chest. The gel will help to transmit the sound waves from the transducer.  The sound waves will harmlessly bounce off of your heart to allow the heart  images to be captured in real-time motion. The images will be recorded on a computer. The procedure may vary among health care providers and hospitals. What happens after the procedure?  You may return to your normal, everyday life, including diet, activities, and medicines, unless your health care provider tells you not to do that. Summary  An  echocardiogram is a procedure that uses painless sound waves (ultrasound) to produce an image of the heart.  Images from an echocardiogram can provide important information about the size and shape of your heart, heart muscle function, heart valve function, and fluid buildup around your heart.  You do not need to do anything to prepare before this procedure. You may eat and drink normally.  After the echocardiogram is completed, you may return to your normal, everyday life, unless your health care provider tells you not to do that. This information is not intended to replace advice given to you by your health care provider. Make sure you discuss any questions you have with your health care provider. Document Released: 02/28/2000 Document Revised: 04/04/2016 Document Reviewed: 04/04/2016 Elsevier Interactive Patient Education  2019 [employer].

## 2018-08-24 LAB — COMPREHENSIVE METABOLIC PANEL
ALT: 20 IU/L (ref 0–44)
AST: 14 IU/L (ref 0–40)
Albumin/Globulin Ratio: 1.6 (ref 1.2–2.2)
Albumin: 3.7 g/dL (ref 3.7–4.7)
Alkaline Phosphatase: 310 IU/L — ABNORMAL HIGH (ref 39–117)
BUN/Creatinine Ratio: 13 (ref 10–24)
BUN: 34 mg/dL — ABNORMAL HIGH (ref 8–27)
Bilirubin Total: 0.6 mg/dL (ref 0.0–1.2)
CO2: 24 mmol/L (ref 20–29)
Calcium: 9.3 mg/dL (ref 8.6–10.2)
Chloride: 104 mmol/L (ref 96–106)
Creatinine, Ser: 2.56 mg/dL — ABNORMAL HIGH (ref 0.76–1.27)
GFR calc Af Amer: 27 mL/min/{1.73_m2} — ABNORMAL LOW (ref >59)
GFR calc non Af Amer: 23 mL/min/{1.73_m2} — ABNORMAL LOW (ref >59)
Globulin, Total: 2.3 g/dL (ref 1.5–4.5)
Glucose: 103 mg/dL — ABNORMAL HIGH (ref 65–99)
Potassium: 4.6 mmol/L (ref 3.5–5.2)
Sodium: 144 mmol/L (ref 134–144)
Total Protein: 6 g/dL (ref 6.0–8.5)

## 2018-08-24 LAB — TSH: TSH: 5.85 u[IU]/mL — ABNORMAL HIGH (ref 0.450–4.500)

## 2018-08-24 LAB — LIPID PANEL
Chol/HDL Ratio: 3.2 ratio (ref 0.0–5.0)
Cholesterol, Total: 167 mg/dL (ref 100–199)
HDL: 52 mg/dL (ref >39)
LDL Calculated: 94 mg/dL (ref 0–99)
Triglycerides: 104 mg/dL (ref 0–149)
VLDL Cholesterol Cal: 21 mg/dL (ref 5–40)

## 2018-08-24 LAB — PRO B NATRIURETIC PEPTIDE: NT-Pro BNP: 3661 pg/mL — ABNORMAL HIGH (ref 0–486)

## 2018-08-26 ENCOUNTER — Telehealth (HOSPITAL_COMMUNITY): Payer: Self-pay | Admitting: Radiology

## 2018-08-26 NOTE — Telephone Encounter (Signed)
Left message with echo instructions-unable to do preCOVID Screening questions.

## 2018-08-29 ENCOUNTER — Telehealth: Payer: Self-pay | Admitting: *Deleted

## 2018-08-29 ENCOUNTER — Other Ambulatory Visit (HOSPITAL_COMMUNITY): Payer: PPO

## 2018-08-29 ENCOUNTER — Ambulatory Visit (HOSPITAL_COMMUNITY): Payer: PPO | Attending: Cardiovascular Disease

## 2018-08-29 ENCOUNTER — Other Ambulatory Visit: Payer: Self-pay

## 2018-08-29 DIAGNOSIS — I5032 Chronic diastolic (congestive) heart failure: Secondary | ICD-10-CM | POA: Insufficient documentation

## 2018-08-29 DIAGNOSIS — I110 Hypertensive heart disease with heart failure: Secondary | ICD-10-CM | POA: Insufficient documentation

## 2018-08-29 NOTE — Telephone Encounter (Signed)
-----   Message from Richardo Priest, MD sent at 08/29/2018  3:51 PM EDT ----- Normal or stable result  Good result, fumction is normal and valves are good

## 2018-08-29 NOTE — Telephone Encounter (Signed)
Telephone call to patient Left message to call back regarding echo results

## 2018-09-28 LAB — HM DIABETES EYE EXAM

## 2018-10-12 ENCOUNTER — Encounter: Payer: Self-pay | Admitting: Neurology

## 2018-10-17 ENCOUNTER — Ambulatory Visit: Payer: No Typology Code available for payment source | Attending: Specialist

## 2018-10-17 ENCOUNTER — Other Ambulatory Visit: Payer: Self-pay

## 2018-10-17 DIAGNOSIS — M545 Low back pain, unspecified: Secondary | ICD-10-CM

## 2018-10-17 DIAGNOSIS — M6281 Muscle weakness (generalized): Secondary | ICD-10-CM | POA: Diagnosis present

## 2018-10-17 DIAGNOSIS — G8929 Other chronic pain: Secondary | ICD-10-CM | POA: Insufficient documentation

## 2018-10-17 DIAGNOSIS — R2681 Unsteadiness on feet: Secondary | ICD-10-CM | POA: Insufficient documentation

## 2018-10-17 DIAGNOSIS — R2689 Other abnormalities of gait and mobility: Secondary | ICD-10-CM | POA: Diagnosis present

## 2018-10-17 NOTE — Therapy (Signed)
[facility] [address] [place], [place], [address] Phone: [phone]   Fax:  [phone]  Physical Therapy Treatment  Patient Details  Name: Jacob [name] MRN: [id number] Date of Birth: July 08, 1940 Referring Provider (PT): Debby Bud, MD   Encounter Date: 10/17/2018  PT End of Session - 10/17/18 1230    Visit Number  1    Date for PT Re-Evaluation  12/12/18    Authorization Type  [place]: 15 visits 7/15-11/02/2019    Authorization - Visit Number  1    Authorization - Number of Visits  15    PT Start Time  1601    PT Stop Time  1219    PT Time Calculation (min)  35 min    Activity Tolerance  Patient tolerated treatment well    Behavior During Therapy  [facility] for tasks assessed/performed       Past Medical History:  Diagnosis Date  . Allergy   . Anemia   . Anxiety   . Arthritis   . Asthma   . BPH (benign prostatic hyperplasia)   . Cancer of kidney ([place])   . Cataract   . CHF (congestive heart failure) ([place])   . Chronic kidney disease    chronic  kidney failure  kidney function at 42%  . COPD (chronic obstructive pulmonary disease) ([place])   . Coronary artery disease    CABG  7 bypasses  . Diabetes mellitus without complication ([place])   . GERD (gastroesophageal reflux disease)   . Gout   . Hepatitis    many years ago  . Hyperlipidemia   . Hypertension   . Lumbar disc disease   . Obesity   . Paroxysmal atrial fibrillation (HCC)   . Peripheral neuropathy   . Polymyalgia rheumatica (HCC)    maintained on Prednisone, Plaquenil. Followed by rhuematology every 4 months/[name].  . Shortness of breath dyspnea    with exertion  . Sleep apnea    CPAP   Trying to use    Past Surgical History:  Procedure Laterality Date  . APPENDECTOMY    . CARDIAC CATHETERIZATION  1998, 2000   left  . CATARACT EXTRACTION W/PHACO Right 11/28/2014   Procedure: CATARACT EXTRACTION PHACO AND INTRAOCULAR LENS PLACEMENT ([place]) RIGHT  ;  Surgeon: Marylynn Pearson, MD;  Location: [facility];  Service: Ophthalmology;  Laterality: Right;  . CORONARY ARTERY BYPASS GRAFT  03/17/1995   [facility]ynonia Lawman; followed every six months.  Marland Kitchen HERNIA REPAIR    . LUMBAR LAMINECTOMY/DECOMPRESSION MICRODISCECTOMY N/A 07/30/2015   Procedure: LUMBAR LAMINECTOMY DISCECTOMY ;  Surgeon: Ashok Pall, MD;  Location: [facility];  Service: Neurosurgery;  Laterality: N/A;  LUMBAR LAMINECTOMY DISCECTOMY   . LUMBAR LAMINECTOMY/DECOMPRESSION MICRODISCECTOMY N/A 09/06/2015   Procedure: Redo L3/4 Disectomy;  Surgeon: Ashok Pall, MD;  Location: [facility];  Service: Neurosurgery;  Laterality: N/A;  . PROSTATE SURGERY     TURP at [place].  Marland Kitchen TRANSURETHRAL RESECTION OF PROSTATE      There were no vitals filed for this visit.  Subjective Assessment - 10/17/18 1151    Subjective  Pt returns to PT with chronic history of LBP, LE weakness and mobility limitations.  Pt reports that he had MRI and MD is suggesting surgery and he is not aggreeable to this plan.  Pt is managing with injections and medicaiton.  Pt had Rt ankle sprain 5 weeks ago and had limited mobility during that time.    Pertinent History  DM, RA,  spinal stenosis with 2 lumbar surgeries, kidney disease, renal cancer    Limitations  Walking    How long can you stand comfortably?  <10 minutes    How long can you walk comfortably?  350 ft (pt report) or < 10 minutes    Diagnostic tests  MRI: pt reports that MD suggested lumbar surgery due to HNP    Patient Stated Goals  improve mobility, improve strength    Currently in Pain?  Yes    Pain Location  Back         [facility] PT Assessment - 10/17/18 0001      Assessment   Medical Diagnosis  generalized weakness    Referring Provider (PT)  Debby Bud, MD    Onset Date/Surgical Date  --   chronic   Prior Therapy  at this facility ending 04/2020- ended due to COVID quarantine      Precautions   Precautions  Fall      Restrictions   Weight Bearing Restrictions  No       Balance Screen   Has the patient fallen in the past 6 months  Yes    How many times?  2   PT will address balance and mobility   Has the patient had a decrease in activity level because of a fear of falling?   Yes    Is the patient reluctant to leave their home because of a fear of falling?   Yes      [place] residence    Living Arrangements  Spouse/significant other    Type of [place] Access  Level entry    Home Layout  One level    [place] - 2 wheels;Toilet riser;Shower seat;Grab bars - toilet;Grab bars - tub/shower      Prior Function   Level of Independence  Independent with household mobility with device;Independent with transfers;Requires assistive device for independence    Vocation  Retired    Leisure  not much activity due to falls, weakness and LBP      Cognition   Overall Cognitive Status  Within Functional Limits for tasks assessed      Posture/Postural Control   Posture/Postural Control  Postural limitations    Postural Limitations  Forward head;Rounded Shoulders;Flexed trunk      ROM / Strength   AROM / PROM / Strength  AROM;PROM;Strength      AROM   Overall AROM   Due to pain      PROM   Overall PROM   Due to pain      Strength   Overall Strength  Deficits    Overall Strength Comments  UE strength: 4+/5     Strength Assessment Site  Knee;Hip;Ankle    Right/Left Hip  Right;Left    Right Hip Flexion  4-/5    Right Hip ABduction  4/5    Left Hip Flexion  4-/5    Left Hip ABduction  4/5    Right/Left Knee  Right;Left    Right Knee Flexion  4/5    Right Knee Extension  4/5    Left Knee Flexion  4/5    Left Knee Extension  4/5    Right/Left Ankle  Right;Left    Right Ankle Dorsiflexion  3-/5    Left Ankle Dorsiflexion  3-/5      Transfers   Transfers  Sit to Stand;Stand to Sit  Sit to Stand  With upper extremity assist    Five time sit to stand comments   25.83 with max UE  support    Stand to Sit  With upper extremity assist      Ambulation/Gait   Ambulation/Gait  Yes    Assistive device  Rolling walker    Gait Pattern  Step-through pattern;Decreased stride length;Decreased dorsiflexion - right;Decreased dorsiflexion - left;Trunk flexed;Decreased stance time - right    Ambulation Surface  Level                             PT Short Term Goals - 10/17/18 1156      PT SHORT TERM GOAL #1   Title  Pt will be independent with his initial HEP to improve flexibility, strength and balance.     Time  4    Period  Weeks    Status  New    Target Date  11/14/18      PT SHORT TERM GOAL #2   Title  perform 5x sit to stand in < or = to 20 seconds with UE support to improve balance    Baseline  --    Time  4    Status  New    Target Date  11/14/18      PT SHORT TERM GOAL #3   Title  report  < or = to 6/10 LBP with standing and walking    Baseline  --    Time  4    Period  Weeks    Status  New    Target Date  11/14/18        PT Long Term Goals - 10/17/18 1224      PT LONG TERM GOAL #1   Title  Pt will demo independence with his advanced HEP to futher promote strenght gains after d/c from PT.     Baseline  --    Time  8    Period  Weeks    Status  New    Target Date  12/12/18      PT LONG TERM GOAL #2   Title  Pt will demo improved functional strength and power of the LEs evident by his ability to complete 5x/sit to stand in less than 17 sec with minimal UE use.    Baseline  --    Time  8    Period  Weeks    Status  New    Target Date  12/12/18      PT LONG TERM GOAL #3   Title  walk for 3-5 minutes in the clinic with upright posture    Baseline  --    Time  8    Period  Weeks    Status  New    Target Date  12/12/18      PT LONG TERM GOAL #4   Title  report 25% improvement in endurance for standing for ADLs and self-care    Baseline  --    Time  8    Period  Weeks    Status  New    Target Date  12/12/18      PT  LONG TERM GOAL #5   Title  --    Baseline  --            Plan - 10/17/18 1215    Clinical Impression Statement  Pt returns to PT with chronic history of  weakness, falls and LBP. Pt was treated at this clinic prior to [address] shut down and was working on endurance and strength to improve safety.  Pt had an ankle sprain ~5 weeks ago and had limited mobility while this was healing.  Pt has had 2 falls over the past 6 months and 5x sit to stand today was 25.83 seconds with max UE support indicating high falls risk. Pt with chronic LBP and stenosis and rates LBP and rates this pain 8/10 which limits his ability to stand for >5-10 minutes.  Pt demonstrates forward trunk flexion in standing.  Pt with LE weakness especially ankle DF.  Pt with multiple co-morbidities including COPD, rheumatoid arthritis, heart and kidney disease which contribute to his condition.  Pt will benefit from return to skilled PT for progression of strength and endurance to improve function, balance and endurance.    Personal Factors and Comorbidities  Comorbidity 3+    Comorbidities  COPD, spinal stenosis, lumbar surgery x2, rheumatoid arthritis    Examination-Activity Limitations  Locomotion Level;Stand;Transfers;Bathing;Bed Mobility    Examination-Participation Restrictions  Community Activity    Clinical Decision Making  Moderate    Rehab Potential  Good    PT Frequency  2x / week    PT Duration  8 weeks    PT Treatment/Interventions  ADLs/Self Care Home Management;Cryotherapy;Electrical Stimulation;Gait training;Moist Heat;Stair training;Functional mobility training;Therapeutic activities;Therapeutic exercise;Balance training;Neuromuscular re-education;Manual techniques;Patient/family education;Taping    PT Next Visit Plan  LE and UE strength, endurance, balance    PT Home Exercise Plan  Access Code: [email or web address]    Consulted and Agree with Plan of Care  Patient       Patient will benefit from skilled therapeutic  intervention in order to improve the following deficits and impairments:  Abnormal gait, Decreased activity tolerance, Decreased endurance, Decreased mobility, Decreased strength, Difficulty walking, Postural dysfunction, Improper body mechanics, Pain  Visit Diagnosis: 1. Unsteadiness on feet   2. Muscle weakness (generalized)   3. Chronic low back pain, unspecified back pain laterality, unspecified whether sciatica present   4. Other abnormalities of gait and mobility        Problem List Patient Active Problem List   Diagnosis Date Noted  . Paroxysmal atrial fibrillation ([place]) 08/22/2018  . High risk medication use 08/22/2018  . Chronic anticoagulation 08/22/2018  . On amiodarone therapy 08/22/2018  . Hypertensive heart disease 04/24/2018  . CKD (chronic kidney disease) 11/27/2015  . Chronic diastolic heart failure ([place]) 11/27/2015  . Normocytic anemia 11/27/2015  . AKI (acute kidney injury) ([place])   . Acute kidney injury superimposed on chronic kidney disease ([place]) 10/02/2015  . Hypotension 10/02/2015  . Anorexia   . Adjustment disorder with mixed emotional features 09/01/2015  . Acute encephalopathy 08/29/2015  . Elevated troponin 08/29/2015  . Renal cancer ([place]) 08/28/2015  . Spinal stenosis of lumbar region with radiculopathy 08/07/2015  . COPD (chronic obstructive pulmonary disease) ([place])   . OSA (obstructive sleep apnea) 07/27/2015  . HNP (herniated nucleus pulposus), lumbar 07/24/2015  . PMR (polymyalgia rheumatica) ([place]) 09/09/2013  . Rheumatoid arthritis ([place]) 04/13/2012  . CAD (coronary artery disease) 12/19/2011  . Insulin dependent diabetes mellitus ([place]) 12/19/2011  . Neuropathy ([place]) 12/19/2011    Pixie Burgener 10/17/2018, 12:32 PM   [facility] [address] [place], [place], [address] Phone: [phone]   Fax:  [phone]  Name: Jacob [name] MRN: [id number] Date of Birth: 07-02-1940

## 2018-10-19 ENCOUNTER — Encounter: Payer: Self-pay | Admitting: Physical Therapy

## 2018-10-19 ENCOUNTER — Ambulatory Visit: Payer: No Typology Code available for payment source | Admitting: Physical Therapy

## 2018-10-19 ENCOUNTER — Other Ambulatory Visit: Payer: Self-pay

## 2018-10-19 DIAGNOSIS — M545 Low back pain, unspecified: Secondary | ICD-10-CM

## 2018-10-19 DIAGNOSIS — G8929 Other chronic pain: Secondary | ICD-10-CM

## 2018-10-19 DIAGNOSIS — R2689 Other abnormalities of gait and mobility: Secondary | ICD-10-CM

## 2018-10-19 DIAGNOSIS — M6281 Muscle weakness (generalized): Secondary | ICD-10-CM

## 2018-10-19 DIAGNOSIS — R2681 Unsteadiness on feet: Secondary | ICD-10-CM | POA: Diagnosis not present

## 2018-10-19 NOTE — Therapy (Signed)
[facility] [address], [place], [place], [address] Phone: [phone]   Fax:  [phone]  Physical Therapy Treatment  Patient Details  Name: Jacob [name] MRN: [id number] Date of Birth: [birth year] Referring Provider (PT): Debby Bud, MD   Encounter Date: 10/19/2018  PT End of Session - 10/19/18 1145    Visit Number  2    Date for PT Re-Evaluation  12/12/18    Authorization Type  [place]: 15 visits 7/15-11/02/2019    Authorization - Visit Number  2    Authorization - Number of Visits  15    PT Start Time  8416    PT Stop Time  1218    PT Time Calculation (min)  34 min    Activity Tolerance  Patient tolerated treatment well    Behavior During Therapy  [facility] for tasks assessed/performed       Past Medical History:  Diagnosis Date  . Allergy   . Anemia   . Anxiety   . Arthritis   . Asthma   . BPH (benign prostatic hyperplasia)   . Cancer of kidney ([place])   . Cataract   . CHF (congestive heart failure) ([place])   . Chronic kidney disease    chronic  kidney failure  kidney function at 42%  . COPD (chronic obstructive pulmonary disease) ([place])   . Coronary artery disease    CABG  7 bypasses  . Diabetes mellitus without complication ([place])   . GERD (gastroesophageal reflux disease)   . Gout   . Hepatitis    many years ago  . Hyperlipidemia   . Hypertension   . Lumbar disc disease   . Obesity   . Paroxysmal atrial fibrillation (HCC)   . Peripheral neuropathy   . Polymyalgia rheumatica (HCC)    maintained on Prednisone, Plaquenil. Followed by rhuematology every 4 months/[name].  . Shortness of breath dyspnea    with exertion  . Sleep apnea    CPAP   Trying to use    Past Surgical History:  Procedure Laterality Date  . APPENDECTOMY    . CARDIAC CATHETERIZATION  1998, 2000   left  . CATARACT EXTRACTION W/PHACO Right 11/28/2014   Procedure: CATARACT EXTRACTION PHACO AND INTRAOCULAR LENS PLACEMENT ([place]) RIGHT  ;  Surgeon: Marylynn Pearson, MD;  Location: [place];  Service: Ophthalmology;  Laterality: Right;  . CORONARY ARTERY BYPASS GRAFT  03/17/1995   Wynonia Lawman; followed every six months.  Marland Kitchen HERNIA REPAIR    . LUMBAR LAMINECTOMY/DECOMPRESSION MICRODISCECTOMY N/A 07/30/2015   Procedure: LUMBAR LAMINECTOMY DISCECTOMY ;  Surgeon: Ashok Pall, MD;  Location: [place] [facility];  Service: Neurosurgery;  Laterality: N/A;  LUMBAR LAMINECTOMY DISCECTOMY   . LUMBAR LAMINECTOMY/DECOMPRESSION MICRODISCECTOMY N/A 09/06/2015   Procedure: Redo L3/4 Disectomy;  Surgeon: Ashok Pall, MD;  Location: [place] [facility];  Service: Neurosurgery;  Laterality: N/A;  . PROSTATE SURGERY     TURP at [place].  Marland Kitchen TRANSURETHRAL RESECTION OF PROSTATE      There were no vitals filed for this visit.  Subjective Assessment - 10/19/18 1147    Subjective  Back pain as usual and leg. getting my shot next week.    Pertinent History  DM, RA, spinal stenosis with 2 lumbar surgeries, kidney disease, renal cancer    Currently in Pain?  Yes   7/10 back pain   Pain Orientation  Lower    Pain Descriptors / Indicators  Radiating    Multiple Pain Sites  No  [facility] Adult PT Treatment/Exercise - 10/19/18 0001      Neuro Re-ed    Neuro Re-ed Details   Seated rocker board for ankle PF/DF 2 min      Lumbar Exercises: Stretches   Active Hamstring Stretch  Right;Left;2 reps;20 seconds   VC to pull toes back     Lumbar Exercises: Aerobic   Nustep  L3 x 5 min      Lumbar Exercises: Standing   Heel Raises  20 reps    Row  Strengthening;Both;20 reps;Theraband    Theraband Level (Row)  Level 2 (Red)    Other Standing Lumbar Exercises  Hip abduction bil  2x 10   VC for posture   Other Standing Lumbar Exercises  Walking 2 min with RW with emphais on standiing with erect posture.       Lumbar Exercises: Seated   Other Seated Lumbar Exercises  Marching 2x10  0#    Other Seated Lumbar Exercises  Sit to stand: 2x5                PT Short Term Goals - 10/19/18 1159      PT SHORT TERM GOAL #1   Title  Pt will be independent with his initial HEP to improve flexibility, strength and balance.     Time  4    Period  Weeks    Status  Achieved        PT Long Term Goals - 10/17/18 1224      PT LONG TERM GOAL #1   Title  Pt will demo independence with his advanced HEP to futher promote strenght gains after d/c from PT.     Baseline  --    Time  8    Period  Weeks    Status  New    Target Date  12/12/18      PT LONG TERM GOAL #2   Title  Pt will demo improved functional strength and power of the LEs evident by his ability to complete 5x/sit to stand in less than 17 sec with minimal UE use.    Baseline  --    Time  8    Period  Weeks    Status  New    Target Date  12/12/18      PT LONG TERM GOAL #3   Title  walk for 3-5 minutes in the clinic with upright posture    Baseline  --    Time  8    Period  Weeks    Status  New    Target Date  12/12/18      PT LONG TERM GOAL #4   Title  report 25% improvement in endurance for standing for ADLs and self-care    Baseline  --    Time  8    Period  Weeks    Status  New    Target Date  12/12/18      PT LONG TERM GOAL #5   Title  --    Baseline  --            Plan - 10/19/18 1146    Clinical Impression Statement  Pt independnet and compliant with initial HEP, meeting STG. Pt needs frequent moments to catch his breath due to difficuty breathing in his mask. PTA would step away so he can pull mask away and get some air. pt was able to walk 2 min with his RW with erect posture. This was extrememly fatiguing  and ended the session.    Personal Factors and Comorbidities  Comorbidity 3+    Comorbidities  COPD, spinal stenosis, lumbar surgery x2, rheumatoid arthritis    Examination-Activity Limitations  Locomotion Level;Stand;Transfers;Bathing;Bed Mobility    Examination-Participation Restrictions  Community Activity    Rehab Potential  Good     PT Frequency  2x / week    PT Duration  8 weeks    PT Treatment/Interventions  ADLs/Self Care Home Management;Cryotherapy;Electrical Stimulation;Gait training;Moist Heat;Stair training;Functional mobility training;Therapeutic activities;Therapeutic exercise;Balance training;Neuromuscular re-education;Manual techniques;Patient/family education;Taping    PT Next Visit Plan  LE and UE strength, endurance, balance    PT Home Exercise Plan  Access Code: [email or web address]    Consulted and Agree with Plan of Care  Patient       Patient will benefit from skilled therapeutic intervention in order to improve the following deficits and impairments:  Abnormal gait, Decreased activity tolerance, Decreased endurance, Decreased mobility, Decreased strength, Difficulty walking, Postural dysfunction, Improper body mechanics, Pain  Visit Diagnosis: 1. Unsteadiness on feet   2. Muscle weakness (generalized)   3. Chronic low back pain, unspecified back pain laterality, unspecified whether sciatica present   4. Other abnormalities of gait and mobility        Problem List Patient Active Problem List   Diagnosis Date Noted  . Paroxysmal atrial fibrillation ([place]) 08/22/2018  . High risk medication use 08/22/2018  . Chronic anticoagulation 08/22/2018  . On amiodarone therapy 08/22/2018  . Hypertensive heart disease 04/24/2018  . CKD (chronic kidney disease) 11/27/2015  . Chronic diastolic heart failure ([place]) 11/27/2015  . Normocytic anemia 11/27/2015  . AKI (acute kidney injury) ([place])   . Acute kidney injury superimposed on chronic kidney disease ([place]) 10/02/2015  . Hypotension 10/02/2015  . Anorexia   . Adjustment disorder with mixed emotional features 09/01/2015  . Acute encephalopathy 08/29/2015  . Elevated troponin 08/29/2015  . Renal cancer ([place]) 08/28/2015  . Spinal stenosis of lumbar region with radiculopathy 08/07/2015  . COPD (chronic obstructive pulmonary disease) ([place])   . OSA (obstructive  sleep apnea) 07/27/2015  . HNP (herniated nucleus pulposus), lumbar 07/24/2015  . PMR (polymyalgia rheumatica) ([place]) 09/09/2013  . Rheumatoid arthritis ([place]) 04/13/2012  . CAD (coronary artery disease) 12/19/2011  . Insulin dependent diabetes mellitus ([place]) 12/19/2011  . Neuropathy ([place]) 12/19/2011    Leannah Guse, PTA 10/19/2018, 12:19 PM  [facility] [address], [place], [place], [address] Phone: [phone]   Fax:  [phone]  Name: DAYDEN VIVERETTE MRN: [id number] Date of Birth: 09/30/1940

## 2018-10-24 ENCOUNTER — Encounter: Payer: Self-pay | Admitting: Physical Therapy

## 2018-10-24 ENCOUNTER — Other Ambulatory Visit: Payer: Self-pay

## 2018-10-24 ENCOUNTER — Ambulatory Visit: Payer: No Typology Code available for payment source | Admitting: Physical Therapy

## 2018-10-24 DIAGNOSIS — R2689 Other abnormalities of gait and mobility: Secondary | ICD-10-CM

## 2018-10-24 DIAGNOSIS — M545 Low back pain, unspecified: Secondary | ICD-10-CM

## 2018-10-24 DIAGNOSIS — R2681 Unsteadiness on feet: Secondary | ICD-10-CM | POA: Diagnosis not present

## 2018-10-24 DIAGNOSIS — M6281 Muscle weakness (generalized): Secondary | ICD-10-CM

## 2018-10-24 DIAGNOSIS — G8929 Other chronic pain: Secondary | ICD-10-CM

## 2018-10-24 NOTE — Therapy (Signed)
[facility] [address] [place], [place], [address] Phone: [phone]   Fax:  [phone]  Physical Therapy Treatment  Patient Details  Name: Jacob [name] MRN: [id number] Date of Birth: [birth year] Referring Provider (PT): Debby Bud, MD   Encounter Date: 10/24/2018  PT End of Session - 10/24/18 1143    Visit Number  3    Date for PT Re-Evaluation  12/12/18    Authorization Type  VA: 15 visits 7/15-11/02/2019    Authorization - Visit Number  3    Authorization - Number of Visits  15    PT Start Time  1140    PT Stop Time  1220    PT Time Calculation (min)  40 min    Activity Tolerance  Patient tolerated treatment well    Behavior During Therapy  [facility] for tasks assessed/performed       Past Medical History:  Diagnosis Date  . Allergy   . Anemia   . Anxiety   . Arthritis   . Asthma   . BPH (benign prostatic hyperplasia)   . Cancer of kidney ([place])   . Cataract   . CHF (congestive heart failure) ([place])   . Chronic kidney disease    chronic  kidney failure  kidney function at 42%  . COPD (chronic obstructive pulmonary disease) ([place])   . Coronary artery disease    CABG  7 bypasses  . Diabetes mellitus without complication ([place])   . GERD (gastroesophageal reflux disease)   . Gout   . Hepatitis    many years ago  . Hyperlipidemia   . Hypertension   . Lumbar disc disease   . Obesity   . Paroxysmal atrial fibrillation (HCC)   . Peripheral neuropathy   . Polymyalgia rheumatica (HCC)    maintained on Prednisone, Plaquenil. Followed by rhuematology every 4 months/[name].  . Shortness of breath dyspnea    with exertion  . Sleep apnea    CPAP   Trying to use    Past Surgical History:  Procedure Laterality Date  . APPENDECTOMY    . CARDIAC CATHETERIZATION  1998, 2000   left  . CATARACT EXTRACTION W/PHACO Right 11/28/2014   Procedure: CATARACT EXTRACTION PHACO AND INTRAOCULAR LENS PLACEMENT ([place]) RIGHT  ;  Surgeon: Marylynn Pearson, MD;  Location: [place];  Service: Ophthalmology;  Laterality: Right;  . CORONARY ARTERY BYPASS GRAFT  03/17/1995   Wynonia Lawman; followed every six months.  Marland Kitchen HERNIA REPAIR    . LUMBAR LAMINECTOMY/DECOMPRESSION MICRODISCECTOMY N/A 07/30/2015   Procedure: LUMBAR LAMINECTOMY DISCECTOMY ;  Surgeon: Ashok Pall, MD;  Location: [place] [facility];  Service: Neurosurgery;  Laterality: N/A;  LUMBAR LAMINECTOMY DISCECTOMY   . LUMBAR LAMINECTOMY/DECOMPRESSION MICRODISCECTOMY N/A 09/06/2015   Procedure: Redo L3/4 Disectomy;  Surgeon: Ashok Pall, MD;  Location: [place] [facility];  Service: Neurosurgery;  Laterality: N/A;  . PROSTATE SURGERY     TURP at [place].  Marland Kitchen TRANSURETHRAL RESECTION OF PROSTATE      There were no vitals filed for this visit.  Subjective Assessment - 10/24/18 1143    Subjective  I felt ok after the last session, no too tired. My legs are achey today.    Pertinent History  DM, RA, spinal stenosis with 2 lumbar surgeries, kidney disease, renal cancer    Diagnostic tests  MRI: pt reports that MD suggested lumbar surgery due to HNP    Currently in Pain?  --   Legs are achey: 5-6/10  [facility] PT Assessment - 10/24/18 0001      Transfers   Five time sit to stand comments   23 sec                   [facility] Adult PT Treatment/Exercise - 10/24/18 0001      Lumbar Exercises: Aerobic   Nustep  L3 x 10 min   PTA present to monitor pt througout     Lumbar Exercises: Standing   Heel Raises  20 reps    Row  Strengthening;Both;20 reps;Theraband   2 sets   Theraband Level (Row)  Level 2 (Red)    Other Standing Lumbar Exercises  Hip abduction bil  2x 10   VC for posture, 1# wts added   Other Standing Lumbar Exercises  Walking 3:30 min with RW with emphais on standiing with erect posture.    Complete fatigue post time     Lumbar Exercises: Seated   Long Arc Quad on Chair  Strengthening;Both;2 sets;10 reps;Weights   2# visable muscle shaking at end   Other Seated  Lumbar Exercises  Seated clamshells green 2x10     Other Seated Lumbar Exercises  Sit to stand: 2x5               PT Short Term Goals - 10/19/18 1159      PT SHORT TERM GOAL #1   Title  Pt will be independent with his initial HEP to improve flexibility, strength and balance.     Time  4    Period  Weeks    Status  Achieved        PT Long Term Goals - 10/17/18 1224      PT LONG TERM GOAL #1   Title  Pt will demo independence with his advanced HEP to futher promote strenght gains after d/c from PT.     Baseline  --    Time  8    Period  Weeks    Status  New    Target Date  12/12/18      PT LONG TERM GOAL #2   Title  Pt will demo improved functional strength and power of the LEs evident by his ability to complete 5x/sit to stand in less than 17 sec with minimal UE use.    Baseline  --    Time  8    Period  Weeks    Status  New    Target Date  12/12/18      PT LONG TERM GOAL #3   Title  walk for 3-5 minutes in the clinic with upright posture    Baseline  --    Time  8    Period  Weeks    Status  New    Target Date  12/12/18      PT LONG TERM GOAL #4   Title  report 25% improvement in endurance for standing for ADLs and self-care    Baseline  --    Time  8    Period  Weeks    Status  New    Target Date  12/12/18      PT LONG TERM GOAL #5   Title  --    Baseline  --            Plan - 10/24/18 1144    Clinical Impression Statement  Pt reports doing doing much HEP over the weekend as he took some car trips. He was able to walk for  3 min 30 sec consecutively, no breaks tryin ghis best to stand with upright posture. pt did well adding light weights for LE PREs. LE were visably shaking at end of session.    Personal Factors and Comorbidities  Comorbidity 3+    Comorbidities  COPD, spinal stenosis, lumbar surgery x2, rheumatoid arthritis    Examination-Activity Limitations  Locomotion Level;Stand;Transfers;Bathing;Bed Mobility    Examination-Participation  Restrictions  Community Activity    Rehab Potential  Good    PT Frequency  2x / week    PT Duration  8 weeks    PT Treatment/Interventions  ADLs/Self Care Home Management;Cryotherapy;Electrical Stimulation;Gait training;Moist Heat;Stair training;Functional mobility training;Therapeutic activities;Therapeutic exercise;Balance training;Neuromuscular re-education;Manual techniques;Patient/family education;Taping    PT Next Visit Plan  LE and UE strength, endurance, balance    PT Home Exercise Plan  Access Code: [email or web address]    Consulted and Agree with Plan of Care  Patient       Patient will benefit from skilled therapeutic intervention in order to improve the following deficits and impairments:  Abnormal gait, Decreased activity tolerance, Decreased endurance, Decreased mobility, Decreased strength, Difficulty walking, Postural dysfunction, Improper body mechanics, Pain  Visit Diagnosis: 1. Unsteadiness on feet   2. Muscle weakness (generalized)   3. Chronic low back pain, unspecified back pain laterality, unspecified whether sciatica present   4. Other abnormalities of gait and mobility        Problem List Patient Active Problem List   Diagnosis Date Noted  . Paroxysmal atrial fibrillation ([place]) 08/22/2018  . High risk medication use 08/22/2018  . Chronic anticoagulation 08/22/2018  . On amiodarone therapy 08/22/2018  . Hypertensive heart disease 04/24/2018  . CKD (chronic kidney disease) 11/27/2015  . Chronic diastolic heart failure ([place]) 11/27/2015  . Normocytic anemia 11/27/2015  . AKI (acute kidney injury) ([place])   . Acute kidney injury superimposed on chronic kidney disease ([place]) 10/02/2015  . Hypotension 10/02/2015  . Anorexia   . Adjustment disorder with mixed emotional features 09/01/2015  . Acute encephalopathy 08/29/2015  . Elevated troponin 08/29/2015  . Renal cancer ([place]) 08/28/2015  . Spinal stenosis of lumbar region with radiculopathy 08/07/2015  . COPD (chronic  obstructive pulmonary disease) ([place])   . OSA (obstructive sleep apnea) 07/27/2015  . HNP (herniated nucleus pulposus), lumbar 07/24/2015  . PMR (polymyalgia rheumatica) ([place]) 09/09/2013  . Rheumatoid arthritis ([place]) 04/13/2012  . CAD (coronary artery disease) 12/19/2011  . Insulin dependent diabetes mellitus ([place]) 12/19/2011  . Neuropathy ([place]) 12/19/2011    Nashea Chumney, PTA 10/24/2018, 12:19 PM  [facility] [address], [place], [place], [address] Phone: [phone]   Fax:  [phone]  Name: TAGEN MILBY MRN: [id number] Date of Birth: 03-24-1940

## 2018-10-26 ENCOUNTER — Other Ambulatory Visit: Payer: Self-pay

## 2018-10-26 ENCOUNTER — Ambulatory Visit: Payer: No Typology Code available for payment source

## 2018-10-26 DIAGNOSIS — M6281 Muscle weakness (generalized): Secondary | ICD-10-CM

## 2018-10-26 DIAGNOSIS — R2689 Other abnormalities of gait and mobility: Secondary | ICD-10-CM

## 2018-10-26 DIAGNOSIS — R2681 Unsteadiness on feet: Secondary | ICD-10-CM | POA: Diagnosis not present

## 2018-10-26 DIAGNOSIS — G8929 Other chronic pain: Secondary | ICD-10-CM

## 2018-10-26 DIAGNOSIS — M545 Low back pain, unspecified: Secondary | ICD-10-CM

## 2018-10-26 NOTE — Therapy (Addendum)
[facility] [address], [place], [place], [address] Phone: [phone]   Fax:  [phone]  Physical Therapy Treatment  Patient Details  Name: Jacob [name] MRN: [id number] Date of Birth: 04/24/1940 Referring Provider (PT): Debby Bud, MD   Encounter Date: 10/26/2018  PT End of Session - 10/26/18 1221    Visit Number  4    Date for PT Re-Evaluation  12/12/18    Authorization Type  [place]: 15 visits 7/15-11/02/2019    Authorization - Visit Number  4    Authorization - Number of Visits  15    PT Start Time  2376    PT Stop Time  1220    PT Time Calculation (min)  36 min    Activity Tolerance  Patient tolerated treatment well;Patient limited by fatigue    Behavior During Therapy  [facility] for tasks assessed/performed       Past Medical History:  Diagnosis Date  . Allergy   . Anemia   . Anxiety   . Arthritis   . Asthma   . BPH (benign prostatic hyperplasia)   . Cancer of kidney ([place])   . Cataract   . CHF (congestive heart failure) ([place])   . Chronic kidney disease    chronic  kidney failure  kidney function at 42%  . COPD (chronic obstructive pulmonary disease) ([place])   . Coronary artery disease    CABG  7 bypasses  . Diabetes mellitus without complication ([place])   . GERD (gastroesophageal reflux disease)   . Gout   . Hepatitis    many years ago  . Hyperlipidemia   . Hypertension   . Lumbar disc disease   . Obesity   . Paroxysmal atrial fibrillation (HCC)   . Peripheral neuropathy   . Polymyalgia rheumatica (HCC)    maintained on Prednisone, Plaquenil. Followed by rhuematology every 4 months/[name].  . Shortness of breath dyspnea    with exertion  . Sleep apnea    CPAP   Trying to use    Past Surgical History:  Procedure Laterality Date  . APPENDECTOMY    . CARDIAC CATHETERIZATION  1998, 2000   left  . CATARACT EXTRACTION W/PHACO Right 11/28/2014   Procedure: CATARACT EXTRACTION PHACO AND INTRAOCULAR  LENS PLACEMENT ([place]) RIGHT ;  Surgeon: Marylynn Pearson, MD;  Location: [facility];  Service: Ophthalmology;  Laterality: Right;  . CORONARY ARTERY BYPASS GRAFT  03/17/1995   Wynonia Lawman; followed every six months.  Marland Kitchen HERNIA REPAIR    . LUMBAR LAMINECTOMY/DECOMPRESSION MICRODISCECTOMY N/A 07/30/2015   Procedure: LUMBAR LAMINECTOMY DISCECTOMY ;  Surgeon: Ashok Pall, MD;  Location: [facility];  Service: Neurosurgery;  Laterality: N/A;  LUMBAR LAMINECTOMY DISCECTOMY   . LUMBAR LAMINECTOMY/DECOMPRESSION MICRODISCECTOMY N/A 09/06/2015   Procedure: Redo L3/4 Disectomy;  Surgeon: Ashok Pall, MD;  Location: [place] [facility];  Service: Neurosurgery;  Laterality: N/A;  . PROSTATE SURGERY     TURP at [place].  Marland Kitchen TRANSURETHRAL RESECTION OF PROSTATE      There were no vitals filed for this visit.  Subjective Assessment - 10/26/18 1150    Subjective  I got a shot in my back yesterday and it already feels better.    Pertinent History  DM, RA, spinal stenosis with 2 lumbar surgeries, kidney disease, renal cancer    Currently in Pain?  Yes    Pain Score  2     Pain Location  Back    Pain Orientation  Lower  Pain Type  Chronic pain    Pain Onset  More than a month ago    Pain Frequency  Constant    Aggravating Factors   standing and walking    Pain Relieving Factors  siting down                       [facility] Adult PT Treatment/Exercise - 10/26/18 0001      Transfers   Five time sit to stand comments   23 sec      Lumbar Exercises: Stretches   Active Hamstring Stretch  Right;Left;2 reps;20 seconds   VC to pull toes back     Lumbar Exercises: Aerobic   Nustep  L3 x 10 min   PTA present to monitor pt througout     Lumbar Exercises: Standing   Heel Raises  20 reps    Row  Strengthening;Both;20 reps;Theraband   2 sets   Theraband Level (Row)  Level 2 (Red)    Other Standing Lumbar Exercises  Hip abduction bil  2x 10   VC for posture, 2# wts added   Other Standing Lumbar Exercises  Walking 3:30  min with RW with emphais on standiing with erect posture. Significant fatigue and LBP at the end of this   Complete fatigue post time     Lumbar Exercises: Seated   Long Arc Quad on Chair  Strengthening;Both;2 sets;10 reps;Weights   2# visable muscle shaking at end   Other Seated Lumbar Exercises  Seated clamshells green 2x10                PT Short Term Goals - 10/26/18 1152      PT SHORT TERM GOAL #1   Title  Pt will be independent with his initial HEP to improve flexibility, strength and balance.     Status  Achieved      PT SHORT TERM GOAL #3   Title  report  < or = to 6/10 LBP with standing and walking    Baseline  lower today after injection    Time  4    Period  Weeks    Status  On-going        PT Long Term Goals - 10/17/18 1224      PT LONG TERM GOAL #1   Title  Pt will demo independence with his advanced HEP to futher promote strenght gains after d/c from PT.     Baseline  --    Time  8    Period  Weeks    Status  New    Target Date  12/12/18      PT LONG TERM GOAL #2   Title  Pt will demo improved functional strength and power of the LEs evident by his ability to complete 5x/sit to stand in less than 17 sec with minimal UE use.    Baseline  --    Time  8    Period  Weeks    Status  New    Target Date  12/12/18      PT LONG TERM GOAL #3   Title  walk for 3-5 minutes in the clinic with upright posture    Baseline  --    Time  8    Period  Weeks    Status  New    Target Date  12/12/18      PT LONG TERM GOAL #4   Title  report 25% improvement in endurance for  standing for ADLs and self-care    Baseline  --    Time  8    Period  Weeks    Status  New    Target Date  12/12/18      PT LONG TERM GOAL #5   Title  --    Baseline  --            Plan - 10/26/18 1219    Clinical Impression Statement  Pt got a steroid injection yesterday and LBP is reduced today. He was able to walk for 3 min 30 sec consecutively, no breaks with attempt to  stand with upright posture. Pt was able to add ankle weights for exercise this week and demonstrates fatigue at end of each set.  Pt with improved posture with standing exercises and has limited tolerance for this posture due to pain.  Pt was closely monitored for safety and fatigue.  Pt will continue to benefit from skilled PT for strength, endurance and gait.    PT Frequency  2x / week    PT Duration  8 weeks    PT Treatment/Interventions  ADLs/Self Care Home Management;Cryotherapy;Electrical Stimulation;Gait training;Moist Heat;Stair training;Functional mobility training;Therapeutic activities;Therapeutic exercise;Balance training;Neuromuscular re-education;Manual techniques;Patient/family education;Taping    PT Next Visit Plan  LE and UE strength, endurance, balance    PT Home Exercise Plan  Access Code: [email or web address]    Consulted and Agree with Plan of Care  Patient       Patient will benefit from skilled therapeutic intervention in order to improve the following deficits and impairments:  Abnormal gait, Decreased activity tolerance, Decreased endurance, Decreased mobility, Decreased strength, Difficulty walking, Postural dysfunction, Improper body mechanics, Pain  Visit Diagnosis: 1. Muscle weakness (generalized)   2. Chronic low back pain, unspecified back pain laterality, unspecified whether sciatica present   3. Other abnormalities of gait and mobility   4. Unsteadiness on feet        Problem List Patient Active Problem List   Diagnosis Date Noted  . Paroxysmal atrial fibrillation ([place]) 08/22/2018  . High risk medication use 08/22/2018  . Chronic anticoagulation 08/22/2018  . On amiodarone therapy 08/22/2018  . Hypertensive heart disease 04/24/2018  . CKD (chronic kidney disease) 11/27/2015  . Chronic diastolic heart failure ([place]) 11/27/2015  . Normocytic anemia 11/27/2015  . AKI (acute kidney injury) ([place])   . Acute kidney injury superimposed on chronic kidney disease ([place])  10/02/2015  . Hypotension 10/02/2015  . Anorexia   . Adjustment disorder with mixed emotional features 09/01/2015  . Acute encephalopathy 08/29/2015  . Elevated troponin 08/29/2015  . Renal cancer ([place]) 08/28/2015  . Spinal stenosis of lumbar region with radiculopathy 08/07/2015  . COPD (chronic obstructive pulmonary disease) ([place])   . OSA (obstructive sleep apnea) 07/27/2015  . HNP (herniated nucleus pulposus), lumbar 07/24/2015  . PMR (polymyalgia rheumatica) ([place]) 09/09/2013  . Rheumatoid arthritis ([place]) 04/13/2012  . CAD (coronary artery disease) 12/19/2011  . Insulin dependent diabetes mellitus ([place]) 12/19/2011  . Neuropathy ([place]) 12/19/2011   Sigurd Sos, PT 10/26/18 12:23 PM PHYSICAL THERAPY DISCHARGE SUMMARY  Visits from Start of Care: 4   Current functional level related to goals / functional outcomes: Pt attended 4 PT sessions and requested D/C from PT.     Remaining deficits: Chronic endurance, strength and balance deficits.  Chronic LBP   Education / Equipment: HEP  Plan: Patient agrees to discharge.  Patient goals were not met. Patient is being discharged due to the patient's request.  ?????  Sigurd Sos, PT 01/10/19 7:31 AM  [facility] [address], [place], [place], [address] Phone: [phone]   Fax:  [phone]  Name: Jacob [name] MRN: [id number] Date of Birth: [birth year]

## 2018-10-31 ENCOUNTER — Emergency Department (HOSPITAL_COMMUNITY): Payer: No Typology Code available for payment source

## 2018-10-31 ENCOUNTER — Ambulatory Visit: Payer: No Typology Code available for payment source | Admitting: Physical Therapy

## 2018-10-31 ENCOUNTER — Other Ambulatory Visit: Payer: Self-pay

## 2018-10-31 ENCOUNTER — Encounter (HOSPITAL_COMMUNITY): Payer: Self-pay

## 2018-10-31 ENCOUNTER — Emergency Department (HOSPITAL_COMMUNITY)
Admission: EM | Admit: 2018-10-31 | Discharge: 2018-10-31 | Disposition: A | Discharge status: Home / Self Care | Payer: No Typology Code available for payment source | Source: Home / Self Care | Attending: Emergency Medicine | Admitting: Emergency Medicine

## 2018-10-31 DIAGNOSIS — I480 Paroxysmal atrial fibrillation: Secondary | ICD-10-CM | POA: Insufficient documentation

## 2018-10-31 DIAGNOSIS — Z87891 Personal history of nicotine dependence: Secondary | ICD-10-CM | POA: Insufficient documentation

## 2018-10-31 DIAGNOSIS — L03211 Cellulitis of face: Secondary | ICD-10-CM | POA: Insufficient documentation

## 2018-10-31 DIAGNOSIS — Z951 Presence of aortocoronary bypass graft: Secondary | ICD-10-CM | POA: Insufficient documentation

## 2018-10-31 DIAGNOSIS — N179 Acute kidney failure, unspecified: Secondary | ICD-10-CM

## 2018-10-31 DIAGNOSIS — I5032 Chronic diastolic (congestive) heart failure: Secondary | ICD-10-CM | POA: Insufficient documentation

## 2018-10-31 DIAGNOSIS — I130 Hypertensive heart and chronic kidney disease with heart failure and stage 1 through stage 4 chronic kidney disease, or unspecified chronic kidney disease: Secondary | ICD-10-CM | POA: Insufficient documentation

## 2018-10-31 DIAGNOSIS — Z794 Long term (current) use of insulin: Secondary | ICD-10-CM

## 2018-10-31 DIAGNOSIS — E1169 Type 2 diabetes mellitus with other specified complication: Secondary | ICD-10-CM

## 2018-10-31 DIAGNOSIS — J449 Chronic obstructive pulmonary disease, unspecified: Secondary | ICD-10-CM | POA: Insufficient documentation

## 2018-10-31 DIAGNOSIS — N189 Chronic kidney disease, unspecified: Secondary | ICD-10-CM | POA: Insufficient documentation

## 2018-10-31 DIAGNOSIS — I2510 Atherosclerotic heart disease of native coronary artery without angina pectoris: Secondary | ICD-10-CM | POA: Insufficient documentation

## 2018-10-31 DIAGNOSIS — E1142 Type 2 diabetes mellitus with diabetic polyneuropathy: Secondary | ICD-10-CM

## 2018-10-31 DIAGNOSIS — Z8679 Personal history of other diseases of the circulatory system: Secondary | ICD-10-CM | POA: Diagnosis not present

## 2018-10-31 DIAGNOSIS — Z7901 Long term (current) use of anticoagulants: Secondary | ICD-10-CM | POA: Insufficient documentation

## 2018-10-31 DIAGNOSIS — E1122 Type 2 diabetes mellitus with diabetic chronic kidney disease: Secondary | ICD-10-CM | POA: Insufficient documentation

## 2018-10-31 DIAGNOSIS — I10 Essential (primary) hypertension: Secondary | ICD-10-CM | POA: Diagnosis not present

## 2018-10-31 DIAGNOSIS — N184 Chronic kidney disease, stage 4 (severe): Secondary | ICD-10-CM

## 2018-10-31 DIAGNOSIS — Z79899 Other long term (current) drug therapy: Secondary | ICD-10-CM | POA: Insufficient documentation

## 2018-10-31 DIAGNOSIS — Z03818 Encounter for observation for suspected exposure to other biological agents ruled out: Secondary | ICD-10-CM | POA: Diagnosis not present

## 2018-10-31 LAB — CBC
HCT: 34.5 % — ABNORMAL LOW (ref 39.0–52.0)
Hemoglobin: 10.6 g/dL — ABNORMAL LOW (ref 13.0–17.0)
MCH: 31 pg (ref 26.0–34.0)
MCHC: 30.7 g/dL (ref 30.0–36.0)
MCV: 100.9 fL — ABNORMAL HIGH (ref 80.0–100.0)
Platelets: 163 10*3/uL (ref 150–400)
RBC: 3.42 MIL/uL — ABNORMAL LOW (ref 4.22–5.81)
RDW: 14.9 % (ref 11.5–15.5)
WBC: 11 10*3/uL — ABNORMAL HIGH (ref 4.0–10.5)
nRBC: 0 % (ref 0.0–0.2)

## 2018-10-31 LAB — BASIC METABOLIC PANEL
Anion gap: 11 (ref 5–15)
BUN: 32 mg/dL — ABNORMAL HIGH (ref 8–23)
CO2: 25 mmol/L (ref 22–32)
Calcium: 8.2 mg/dL — ABNORMAL LOW (ref 8.9–10.3)
Chloride: 101 mmol/L (ref 98–111)
Creatinine, Ser: 2.63 mg/dL — ABNORMAL HIGH (ref 0.61–1.24)
GFR calc Af Amer: 26 mL/min — ABNORMAL LOW (ref >60)
GFR calc non Af Amer: 22 mL/min — ABNORMAL LOW (ref >60)
Glucose, Bld: 197 mg/dL — ABNORMAL HIGH (ref 70–99)
Potassium: 4.3 mmol/L (ref 3.5–5.1)
Sodium: 137 mmol/L (ref 135–145)

## 2018-10-31 LAB — BRAIN NATRIURETIC PEPTIDE: B Natriuretic Peptide: 1473.5 pg/mL — ABNORMAL HIGH (ref 0.0–100.0)

## 2018-10-31 LAB — LACTIC ACID, PLASMA: Lactic Acid, Venous: 1.4 mmol/L (ref 0.5–1.9)

## 2018-10-31 MED ORDER — MUPIROCIN 2 % EX OINT: 1.0000 "application " | TOPICAL_OINTMENT | Freq: Two times a day (BID) | CUTANEOUS | 0 refills | Status: DC

## 2018-10-31 MED ORDER — CLINDAMYCIN PHOSPHATE 600 MG/50ML IV SOLN
600.0000 mg | Freq: Once | INTRAVENOUS | Status: AC
  Administered 2018-10-31: 600 mg via INTRAVENOUS
  Filled 2018-10-31: qty 50

## 2018-10-31 MED ORDER — LORAZEPAM 2 MG/ML IJ SOLN
1.0000 mg | Freq: Once | INTRAMUSCULAR | Status: AC | PRN
  Administered 2018-10-31: 1 mg via INTRAVENOUS
  Filled 2018-10-31: qty 1

## 2018-10-31 MED ORDER — DOXYCYCLINE HYCLATE 100 MG PO CAPS: 100.0000 mg | ORAL_CAPSULE | Freq: Two times a day (BID) | ORAL | 0 refills | Status: DC

## 2018-10-31 NOTE — ED Provider Notes (Signed)
[facility] EMERGENCY DEPARTMENT Provider Note   CSN: [id number] Arrival date & time: 10/31/18  1110    History   Chief Complaint Chief Complaint  Patient presents with  . Shortness of Breath  . Facial Swelling    HPI Jacob [name] is a 78 y.o. male.     78 year old male with complex medical history listed below brought in by daughter for left side facial infection and shortness of breath.  Patient reports several tender pustules to his face which she has been trying to squeeze and drain at home.  Patient was given antibiotics 1 month ago for same with improvement.  Reports return of facial infection 3 days ago, progressively worsening as patient continues to pick at the areas. Patient went to his PCP at the [facility] in [place] this morning and was advised to go to the ER. Also reports SHOB for several months, history of asthma, COPD, sarcoidosis.  Patient was referred for evaluation with pulmonology however this has not taken place yet.  Patient reports worsening shortness of breath last night, states he did not sleep well last night due to difficulty breathing.  Patient denies any chest pain.  Patient is a history of CHF, daughter at bedside states legs are at about baseline swelling, question increased swelling in the right leg little.  Patient has a history of paroxysmal A. fib, is on Eliquis.  No other complaints or concerns today.     Past Medical History:  Diagnosis Date  . Allergy   . Anemia   . Anxiety   . Arthritis   . Asthma   . BPH (benign prostatic hyperplasia)   . Cancer of kidney ([place])   . Cataract   . CHF (congestive heart failure) ([place])   . Chronic kidney disease    chronic  kidney failure  kidney function at 42%  . COPD (chronic obstructive pulmonary disease) ([place])   . Coronary artery disease    CABG  7 bypasses  . Diabetes mellitus without complication ([place])   . GERD (gastroesophageal reflux disease)   . Gout   . Hepatitis    many  years ago  . Hyperlipidemia   . Hypertension   . Lumbar disc disease   . Obesity   . Paroxysmal atrial fibrillation (HCC)   . Peripheral neuropathy   . Polymyalgia rheumatica (HCC)    maintained on Prednisone, Plaquenil. Followed by rhuematology every 4 months/[name].  . Shortness of breath dyspnea    with exertion  . Sleep apnea    CPAP   Trying to use    Patient Active Problem List   Diagnosis Date Noted  . Paroxysmal atrial fibrillation ([place]) 08/22/2018  . High risk medication use 08/22/2018  . Chronic anticoagulation 08/22/2018  . On amiodarone therapy 08/22/2018  . Hypertensive heart disease 04/24/2018  . CKD (chronic kidney disease) 11/27/2015  . Chronic diastolic heart failure ([place]) 11/27/2015  . Normocytic anemia 11/27/2015  . AKI (acute kidney injury) ([place])   . Acute kidney injury superimposed on chronic kidney disease ([place]) 10/02/2015  . Hypotension 10/02/2015  . Anorexia   . Adjustment disorder with mixed emotional features 09/01/2015  . Acute encephalopathy 08/29/2015  . Elevated troponin 08/29/2015  . Renal cancer ([place]) 08/28/2015  . Spinal stenosis of lumbar region with radiculopathy 08/07/2015  . COPD (chronic obstructive pulmonary disease) ([place])   . OSA (obstructive sleep apnea) 07/27/2015  . HNP (herniated nucleus pulposus), lumbar 07/24/2015  . PMR (polymyalgia rheumatica) ([place]) 09/09/2013  .  Rheumatoid arthritis ([place]) 04/13/2012  . CAD (coronary artery disease) 12/19/2011  . Insulin dependent diabetes mellitus ([place]) 12/19/2011  . Neuropathy ([place]) 12/19/2011    Past Surgical History:  Procedure Laterality Date  . APPENDECTOMY    . CARDIAC CATHETERIZATION  1998, 2000   left  . CATARACT EXTRACTION W/PHACO Right 11/28/2014   Procedure: CATARACT EXTRACTION PHACO AND INTRAOCULAR LENS PLACEMENT ([place]) RIGHT ;  Surgeon: Marylynn Pearson, MD;  Location: [facility];  Service: Ophthalmology;  Laterality: Right;  . CORONARY ARTERY BYPASS GRAFT  03/17/1995   Wynonia Lawman;  followed every six months.  Marland Kitchen HERNIA REPAIR    . LUMBAR LAMINECTOMY/DECOMPRESSION MICRODISCECTOMY N/A 07/30/2015   Procedure: LUMBAR LAMINECTOMY DISCECTOMY ;  Surgeon: Ashok Pall, MD;  Location: [facility];  Service: Neurosurgery;  Laterality: N/A;  LUMBAR LAMINECTOMY DISCECTOMY   . LUMBAR LAMINECTOMY/DECOMPRESSION MICRODISCECTOMY N/A 09/06/2015   Procedure: Redo L3/4 Disectomy;  Surgeon: Ashok Pall, MD;  Location: [facility];  Service: Neurosurgery;  Laterality: N/A;  . PROSTATE SURGERY     TURP at [facility].  Marland Kitchen TRANSURETHRAL RESECTION OF PROSTATE          Home Medications    Prior to Admission medications   Medication Sig Start Date End Date Taking? Authorizing Provider  albuterol (PROVENTIL HFA;VENTOLIN HFA) 108 (90 BASE) MCG/ACT inhaler Inhale 2 puffs into the lungs every 6 (six) hours as needed for wheezing or shortness of breath. 07/16/13   Wardell Honour, MD  amiodarone (PACERONE) 200 MG tablet Take 100 mg by mouth daily.    [provider]  amLODipine (NORVASC) 10 MG tablet Take 1 tablet (10 mg total) by mouth daily. 05/16/18   Richardo Priest, MD  apixaban (ELIQUIS) 5 MG TABS tablet Take 5 mg by mouth 2 (two) times daily.    [provider]  atorvastatin (LIPITOR) 20 MG tablet Take 20mg  (1 tab) 2 days a week 08/23/18   Richardo Priest, MD  budesonide-formoterol [facility]) 160-4.5 MCG/ACT inhaler Inhale 2 puffs into the lungs 2 (two) times daily.    [provider]  calcitRIOL (ROCALTROL) 0.25 MCG capsule Take 0.25 mcg by mouth every Monday, Wednesday, and Friday.    [provider]  cefUROXime (CEFTIN) 250 MG tablet Take 250 mg by mouth 2 (two) times daily as needed.    [provider]  chlorpheniramine (CHLOR-TRIMETON) 4 MG tablet Take 4 mg by mouth at bedtime.    [provider]  cyanocobalamin 1000 MCG tablet Take 1,000 mcg by mouth daily.    [provider]  denosumab (PROLIA) 60 MG/ML [place]Y injection Inject 60 mg into  the skin every 6 (six) months.    [provider]  ferrous sulfate 325 (65 FE) MG tablet Take 325 mg by mouth as directed. MWF    [provider]  finasteride (PROSCAR) 5 MG tablet Take 5 mg by mouth at bedtime.     [provider]  furosemide (LASIX) 40 MG tablet Take 40 mg by mouth daily. 06/05/18   [provider]  hydrALAZINE (APRESOLINE) 25 MG tablet Take 25 mg by mouth 2 (two) times daily.    [provider]  insulin aspart protamine- aspart (NOVOLOG MIX 70/30) (70-30) 100 UNIT/ML injection Inject into the skin as directed.     [provider]  latanoprost (XALATAN) 0.005 % ophthalmic solution Place 1 drop into both eyes at bedtime.    [provider]  loratadine (CLARITIN) 10 MG tablet Take 10 mg by mouth daily.  [provider]  metoprolol tartrate (LOPRESSOR) 100 MG tablet Take 100 mg by mouth 2 (two) times daily.    [provider]  montelukast (SINGULAIR) 10 MG tablet Take 1 tablet (10 mg total) by mouth at bedtime. 07/27/13   Wardell Honour, MD  Multiple Vitamins-Minerals (PRESERVISION AREDS 2 PO) Take 2 tablets by mouth daily.     [provider]  nitroGLYCERIN (NITROSTAT) 0.4 MG SL tablet Place 0.4 mg under the tongue every 5 (five) minutes as needed for chest pain.    [provider]  oxymetazoline (AFRIN) 0.05 % nasal spray Place 2 sprays into both nostrils at bedtime.    [provider]  pantoprazole (PROTONIX) 40 MG tablet Take 40 mg by mouth 2 (two) times daily.     [provider]  pilocarpine (PILOCAR) 4 % ophthalmic solution Place 1 drop into both eyes 3 (three) times daily.     [provider]  predniSONE (DELTASONE) 5 MG tablet Take 5 mg by mouth daily with breakfast.     [provider]  pregabalin (LYRICA) 75 MG capsule Take 100 mg by mouth 2 (two) times daily.     [provider]  SEMAGLUTIDE,0.25 OR 0.5MG /DOS, [place] Inject 1.5 mLs  into the skin once a week. 0.5mg /0.341ml    [provider]  Tamsulosin HCl (FLOMAX) 0.4 MG CAPS Take 0.4 mg by mouth 2 (two) times daily.     [provider]    Family History Family History  Problem Relation Age of Onset  . Hypertension Other   . Diabetes Brother   . Kidney failure Brother     Social History Social History   Tobacco Use  . Smoking status: Former Smoker    Types: Cigarettes    Quit date: 03/16/1957    Years since quitting: 61.6  . Smokeless tobacco: Never Used  Substance Use Topics  . Alcohol use: No  . Drug use: No     Allergies   Ace inhibitors, Actonel [risedronate], Ciprocinonide [fluocinolone], Flunisolide, Metformin and related, Sertraline, Sulindac, and Terazosin   Review of Systems Review of Systems  Constitutional: Positive for appetite change. Negative for chills, diaphoresis and fever.       Sleeping more  HENT: Negative for congestion.   Respiratory: Positive for cough and shortness of breath.   Cardiovascular: Negative for chest pain and leg swelling.  Gastrointestinal: Negative for abdominal pain, constipation, diarrhea, nausea and vomiting.  Genitourinary: Negative for decreased urine volume and difficulty urinating.  Musculoskeletal: Negative for neck pain and neck stiffness.  Skin: Positive for color change.  Neurological: Negative for weakness.  Psychiatric/Behavioral: Negative for confusion.  All other systems reviewed and are negative.    Physical Exam Updated Vital Signs BP 136/66   Pulse 74   Temp 99.6 F (37.6 C) (Oral)   Resp 18   SpO2 92%   Physical Exam Vitals signs and nursing note reviewed.  Constitutional:      General: He is not in acute distress.    Appearance: He is well-developed. He is not diaphoretic.  HENT:     Head: Atraumatic.   Cardiovascular:     Rate and Rhythm: Normal rate and regular rhythm.  Pulmonary:     Effort: Pulmonary effort is normal.     Breath sounds: Examination  of the right-upper field reveals wheezing. Examination of the left-upper field reveals wheezing. Wheezing present. No decreased breath sounds.  Chest:     Chest wall: No tenderness.  Abdominal:     Palpations: Abdomen is soft.     Tenderness: There is no abdominal tenderness.  Musculoskeletal:     Right lower leg: Edema present.     Left lower leg: Edema present.     Comments: Mild edema lower legs  Skin:    General: Skin is warm and dry.     Findings: Erythema present.  Neurological:     Mental Status: He is alert and oriented to person, place, and time.  Psychiatric:        Behavior: Behavior normal.     Media Information    Document Information  Photos    10/31/2018 12:18  Attached To:  Hospital Encounter on 10/31/18  Source Information  [facility]  [facility]    ED Treatments / Results  Labs (all labs ordered are listed, but only abnormal results are displayed) Labs Reviewed  BASIC METABOLIC PANEL - Abnormal; Notable for the following components:      Result Value   Glucose, Bld 197 (*)    BUN 32 (*)    Creatinine, Ser 2.63 (*)    Calcium 8.2 (*)    GFR calc non Af Amer 22 (*)    GFR calc Af Amer 26 (*)    All other components within normal limits  CBC - Abnormal; Notable for the following components:   WBC 11.0 (*)    RBC 3.42 (*)    Hemoglobin 10.6 (*)    HCT 34.5 (*)    MCV 100.9 (*)    All other components within normal limits  LACTIC ACID, PLASMA  BRAIN NATRIURETIC PEPTIDE  LACTIC ACID, PLASMA    EKG EKG Interpretation  Date/Time:  Monday October 31 2018 11:35:53 EDT Ventricular Rate:  69 PR Interval:  222 QRS Duration: 154 QT Interval:  432 QTC Calculation: 462 R Axis:   144 Text Interpretation:  Sinus rhythm with 1st degree A-V block with Premature atrial complexes Right bundle branch block Abnormal ECG new RBBB compared with prior 9/17 Confirmed by Aletta Edouard [phone]) on 10/31/2018 11:59:48 AM   Radiology Dg  Chest Port 1 View  Result Date: 10/31/2018 CLINICAL DATA:  Chest pain and shortness of breath. EXAM: PORTABLE CHEST 1 VIEW COMPARISON:  12/01/2015. FINDINGS: Stable enlarged cardiac silhouette and post CABG changes. Mildly prominent pulmonary vasculature. Minimally prominent interstitial markings with improvement. Stable small calcified granuloma at the right lung base. Interval small left pleural effusion and mild left basilar airspace opacity. Diffuse osteopenia. IMPRESSION: 1. Interval small left pleural effusion and mild left basilar atelectasis or pneumonia. 2. Stable cardiomegaly and minimal chronic interstitial lung disease with resolved changes of congestive heart failure. Electronically Signed   By: Claudie Revering M.D.   On: 10/31/2018 13:06    Procedures Procedures (including critical care time)  Medications Ordered in ED Medications  LORazepam (ATIVAN) injection 1 mg (1 mg Intravenous Given 10/31/18 1304)     Initial Impression / Assessment and Plan / ED Course  I have reviewed the triage vital signs and the nursing notes.  Pertinent labs & imaging results that were available during my care of the patient were reviewed by me and considered in my medical decision making (see chart for details).  Clinical Course as of Oct 30 1953  Mon Oct 31, 3615  6875 78 year old male with chronic shortness of breath multifactorial including CHF COPD sarcoid here with increased shortness of breath along with left-sided facial swelling.  He has had a history of  some facial cellulitis in the past and it sounds like he picks at lesions.  He has a low-grade temp here and he was mildly tachypneic.  He is not requiring supplemental oxygen though.  He has some soft tissue thickening and tenderness throughout the left side of his face but no area of fluctuance.  CT shows cellulitis but no true abscess.   [MB]  0141 Shared decision making with patient's granddaughter, offered admission for facial cellulitis.   Patient's granddaughter feels patient would do better at home at this time, concern for confusion which occurs when he is admitted to the hospital previously, concern for current COVID pandemic, concern for patient unable to tolerate wearing a facemask properly.  Patient was given IV clindamycin while in the emergency room prior to discharge.  Discharged home with doxycycline for facial cellulitis, concern for previous MRSA, chest x-ray with atelectasis versus early pneumonia.  Also given prescription for nasal Bactroban for recurrent skin infections.  Granddaughter is in agreement for close monitoring with return to ER for any worsening or concerning symptoms, states she is an EMT and is comfortable with this plan.  Recommend close follow-up with PCP.   [LM]    Clinical Course User Index [LM] Tacy Learn, PA-C [MB] Hayden Rasmussen, MD      Final Clinical Impressions(s) / ED Diagnoses   Final diagnoses:  None    ED Discharge Orders    None       Roque Lias 10/31/18 1955    Hayden Rasmussen, MD 11/01/18 [phone]

## 2018-10-31 NOTE — ED Notes (Addendum)
Pt wife called and informed pt can have one visitor [phone]

## 2018-10-31 NOTE — Discharge Instructions (Addendum)
Take antibiotics as prescribed, apply antibiotic ointment inside each nostril. You may apply warm compresses to the face. Return to the emergency room at anytime for any new or worsening symptoms.  Recommend close follow-up with your primary care provider.

## 2018-10-31 NOTE — ED Triage Notes (Signed)
Onset 3 days ago boil on left side of face, swollen, painful.  Boil also underneath nose and on bottom lip.   Also, c/o increased shortness of breath.

## 2018-11-01 ENCOUNTER — Encounter (HOSPITAL_COMMUNITY): Payer: Self-pay | Admitting: Emergency Medicine

## 2018-11-01 ENCOUNTER — Inpatient Hospital Stay (HOSPITAL_COMMUNITY): Payer: No Typology Code available for payment source

## 2018-11-01 ENCOUNTER — Other Ambulatory Visit: Payer: Self-pay

## 2018-11-01 ENCOUNTER — Emergency Department (HOSPITAL_COMMUNITY): Payer: No Typology Code available for payment source

## 2018-11-01 ENCOUNTER — Inpatient Hospital Stay (HOSPITAL_COMMUNITY)
Admission: EM | Admit: 2018-11-01 | Discharge: 2018-11-04 | DRG: 602 | Disposition: A | Discharge status: Home Health | Payer: No Typology Code available for payment source | Attending: Family Medicine | Admitting: Family Medicine

## 2018-11-01 DIAGNOSIS — G4733 Obstructive sleep apnea (adult) (pediatric): Secondary | ICD-10-CM | POA: Diagnosis present

## 2018-11-01 DIAGNOSIS — N184 Chronic kidney disease, stage 4 (severe): Secondary | ICD-10-CM | POA: Diagnosis present

## 2018-11-01 DIAGNOSIS — E669 Obesity, unspecified: Secondary | ICD-10-CM | POA: Diagnosis present

## 2018-11-01 DIAGNOSIS — E1140 Type 2 diabetes mellitus with diabetic neuropathy, unspecified: Secondary | ICD-10-CM | POA: Diagnosis present

## 2018-11-01 DIAGNOSIS — Z951 Presence of aortocoronary bypass graft: Secondary | ICD-10-CM

## 2018-11-01 DIAGNOSIS — J449 Chronic obstructive pulmonary disease, unspecified: Secondary | ICD-10-CM | POA: Diagnosis present

## 2018-11-01 DIAGNOSIS — Z03818 Encounter for observation for suspected exposure to other biological agents ruled out: Secondary | ICD-10-CM | POA: Diagnosis not present

## 2018-11-01 DIAGNOSIS — I130 Hypertensive heart and chronic kidney disease with heart failure and stage 1 through stage 4 chronic kidney disease, or unspecified chronic kidney disease: Secondary | ICD-10-CM | POA: Diagnosis present

## 2018-11-01 DIAGNOSIS — I509 Heart failure, unspecified: Secondary | ICD-10-CM | POA: Diagnosis not present

## 2018-11-01 DIAGNOSIS — I2510 Atherosclerotic heart disease of native coronary artery without angina pectoris: Secondary | ICD-10-CM | POA: Diagnosis present

## 2018-11-01 DIAGNOSIS — Z79899 Other long term (current) drug therapy: Secondary | ICD-10-CM | POA: Diagnosis not present

## 2018-11-01 DIAGNOSIS — Z7901 Long term (current) use of anticoagulants: Secondary | ICD-10-CM

## 2018-11-01 DIAGNOSIS — N183 Chronic kidney disease, stage 3 (moderate): Secondary | ICD-10-CM | POA: Diagnosis not present

## 2018-11-01 DIAGNOSIS — F419 Anxiety disorder, unspecified: Secondary | ICD-10-CM | POA: Diagnosis present

## 2018-11-01 DIAGNOSIS — Z20828 Contact with and (suspected) exposure to other viral communicable diseases: Secondary | ICD-10-CM | POA: Diagnosis present

## 2018-11-01 DIAGNOSIS — L03211 Cellulitis of face: Secondary | ICD-10-CM | POA: Diagnosis present

## 2018-11-01 DIAGNOSIS — N400 Enlarged prostate without lower urinary tract symptoms: Secondary | ICD-10-CM | POA: Diagnosis present

## 2018-11-01 DIAGNOSIS — I480 Paroxysmal atrial fibrillation: Secondary | ICD-10-CM | POA: Diagnosis present

## 2018-11-01 DIAGNOSIS — E1022 Type 1 diabetes mellitus with diabetic chronic kidney disease: Secondary | ICD-10-CM | POA: Diagnosis not present

## 2018-11-01 DIAGNOSIS — Z87891 Personal history of nicotine dependence: Secondary | ICD-10-CM

## 2018-11-01 DIAGNOSIS — Z7951 Long term (current) use of inhaled steroids: Secondary | ICD-10-CM

## 2018-11-01 DIAGNOSIS — M353 Polymyalgia rheumatica: Secondary | ICD-10-CM | POA: Diagnosis present

## 2018-11-01 DIAGNOSIS — E1122 Type 2 diabetes mellitus with diabetic chronic kidney disease: Secondary | ICD-10-CM | POA: Diagnosis present

## 2018-11-01 DIAGNOSIS — M069 Rheumatoid arthritis, unspecified: Secondary | ICD-10-CM | POA: Diagnosis present

## 2018-11-01 DIAGNOSIS — R1310 Dysphagia, unspecified: Secondary | ICD-10-CM | POA: Diagnosis present

## 2018-11-01 DIAGNOSIS — Z8679 Personal history of other diseases of the circulatory system: Secondary | ICD-10-CM

## 2018-11-01 DIAGNOSIS — I5033 Acute on chronic diastolic (congestive) heart failure: Secondary | ICD-10-CM | POA: Diagnosis present

## 2018-11-01 DIAGNOSIS — Z794 Long term (current) use of insulin: Secondary | ICD-10-CM | POA: Diagnosis not present

## 2018-11-01 DIAGNOSIS — Z888 Allergy status to other drugs, medicaments and biological substances status: Secondary | ICD-10-CM | POA: Diagnosis not present

## 2018-11-01 DIAGNOSIS — Z85528 Personal history of other malignant neoplasm of kidney: Secondary | ICD-10-CM

## 2018-11-01 DIAGNOSIS — R269 Unspecified abnormalities of gait and mobility: Secondary | ICD-10-CM

## 2018-11-01 DIAGNOSIS — I440 Atrioventricular block, first degree: Secondary | ICD-10-CM | POA: Diagnosis present

## 2018-11-01 DIAGNOSIS — I4510 Unspecified right bundle-branch block: Secondary | ICD-10-CM | POA: Diagnosis present

## 2018-11-01 DIAGNOSIS — Z833 Family history of diabetes mellitus: Secondary | ICD-10-CM

## 2018-11-01 DIAGNOSIS — Z7952 Long term (current) use of systemic steroids: Secondary | ICD-10-CM

## 2018-11-01 DIAGNOSIS — Z841 Family history of disorders of kidney and ureter: Secondary | ICD-10-CM

## 2018-11-01 DIAGNOSIS — K219 Gastro-esophageal reflux disease without esophagitis: Secondary | ICD-10-CM | POA: Diagnosis present

## 2018-11-01 DIAGNOSIS — Z6831 Body mass index (BMI) 31.0-31.9, adult: Secondary | ICD-10-CM

## 2018-11-01 DIAGNOSIS — Z8249 Family history of ischemic heart disease and other diseases of the circulatory system: Secondary | ICD-10-CM

## 2018-11-01 DIAGNOSIS — L0390 Cellulitis, unspecified: Secondary | ICD-10-CM | POA: Diagnosis present

## 2018-11-01 DIAGNOSIS — D869 Sarcoidosis, unspecified: Secondary | ICD-10-CM | POA: Diagnosis present

## 2018-11-01 LAB — CBC WITH DIFFERENTIAL/PLATELET
Abs Immature Granulocytes: 0.05 10*3/uL (ref 0.00–0.07)
Basophils Absolute: 0.1 10*3/uL (ref 0.0–0.1)
Basophils Relative: 1 %
Eosinophils Absolute: 0 10*3/uL (ref 0.0–0.5)
Eosinophils Relative: 0 %
HCT: 37.2 % — ABNORMAL LOW (ref 39.0–52.0)
Hemoglobin: 11.7 g/dL — ABNORMAL LOW (ref 13.0–17.0)
Immature Granulocytes: 0 %
Lymphocytes Relative: 13 %
Lymphs Abs: 1.5 10*3/uL (ref 0.7–4.0)
MCH: 31.5 pg (ref 26.0–34.0)
MCHC: 31.5 g/dL (ref 30.0–36.0)
MCV: 100 fL (ref 80.0–100.0)
Monocytes Absolute: 1.2 10*3/uL — ABNORMAL HIGH (ref 0.1–1.0)
Monocytes Relative: 11 %
Neutro Abs: 8.5 10*3/uL — ABNORMAL HIGH (ref 1.7–7.7)
Neutrophils Relative %: 75 %
Platelets: 174 10*3/uL (ref 150–400)
RBC: 3.72 MIL/uL — ABNORMAL LOW (ref 4.22–5.81)
RDW: 14.9 % (ref 11.5–15.5)
WBC: 11.3 10*3/uL — ABNORMAL HIGH (ref 4.0–10.5)
nRBC: 0 % (ref 0.0–0.2)

## 2018-11-01 LAB — BASIC METABOLIC PANEL
Anion gap: 12 (ref 5–15)
BUN: 36 mg/dL — ABNORMAL HIGH (ref 8–23)
CO2: 25 mmol/L (ref 22–32)
Calcium: 8.3 mg/dL — ABNORMAL LOW (ref 8.9–10.3)
Chloride: 103 mmol/L (ref 98–111)
Creatinine, Ser: 2.87 mg/dL — ABNORMAL HIGH (ref 0.61–1.24)
GFR calc Af Amer: 23 mL/min — ABNORMAL LOW (ref >60)
GFR calc non Af Amer: 20 mL/min — ABNORMAL LOW (ref >60)
Glucose, Bld: 123 mg/dL — ABNORMAL HIGH (ref 70–99)
Potassium: 4.3 mmol/L (ref 3.5–5.1)
Sodium: 140 mmol/L (ref 135–145)

## 2018-11-01 LAB — SARS CORONAVIRUS 2 BY RT PCR (HOSPITAL ORDER, PERFORMED IN ~~LOC~~ HOSPITAL LAB): SARS Coronavirus 2: NEGATIVE

## 2018-11-01 LAB — BRAIN NATRIURETIC PEPTIDE: B Natriuretic Peptide: 1675.9 pg/mL — ABNORMAL HIGH (ref 0.0–100.0)

## 2018-11-01 MED ORDER — CALCITRIOL 0.25 MCG PO CAPS
0.2500 ug | ORAL_CAPSULE | ORAL | Status: DC
  Administered 2018-11-02 – 2018-11-04 (×2): 0.25 ug via ORAL
  Filled 2018-11-01 (×3): qty 1

## 2018-11-01 MED ORDER — LATANOPROST 0.005 % OP SOLN
1.0000 [drp] | Freq: Every day | OPHTHALMIC | Status: DC
  Administered 2018-11-02 – 2018-11-03 (×2): 1 [drp] via OPHTHALMIC
  Filled 2018-11-01: qty 2.5

## 2018-11-01 MED ORDER — AMLODIPINE BESYLATE 5 MG PO TABS
10.0000 mg | ORAL_TABLET | Freq: Every day | ORAL | Status: DC
  Administered 2018-11-02 – 2018-11-04 (×3): 10 mg via ORAL
  Filled 2018-11-01 (×3): qty 2

## 2018-11-01 MED ORDER — MOMETASONE FURO-FORMOTEROL FUM 200-5 MCG/ACT IN AERO
2.0000 | INHALATION_SPRAY | Freq: Two times a day (BID) | RESPIRATORY_TRACT | Status: DC
  Administered 2018-11-02 – 2018-11-04 (×5): 2 via RESPIRATORY_TRACT
  Filled 2018-11-01: qty 8.8

## 2018-11-01 MED ORDER — MONTELUKAST SODIUM 10 MG PO TABS
10.0000 mg | ORAL_TABLET | Freq: Every day | ORAL | Status: DC
  Administered 2018-11-02 – 2018-11-03 (×3): 10 mg via ORAL
  Filled 2018-11-01 (×4): qty 1

## 2018-11-01 MED ORDER — PANTOPRAZOLE SODIUM 40 MG PO TBEC
40.0000 mg | DELAYED_RELEASE_TABLET | Freq: Two times a day (BID) | ORAL | Status: DC
  Administered 2018-11-02 – 2018-11-04 (×6): 40 mg via ORAL
  Filled 2018-11-01 (×6): qty 1

## 2018-11-01 MED ORDER — ACETAMINOPHEN 325 MG PO TABS
650.0000 mg | ORAL_TABLET | Freq: Four times a day (QID) | ORAL | Status: DC | PRN
  Administered 2018-11-02: 650 mg via ORAL
  Filled 2018-11-01: qty 2

## 2018-11-01 MED ORDER — ATORVASTATIN CALCIUM 10 MG PO TABS
20.0000 mg | ORAL_TABLET | Freq: Every day | ORAL | Status: DC
  Administered 2018-11-02 – 2018-11-03 (×2): 20 mg via ORAL
  Filled 2018-11-01 (×2): qty 2

## 2018-11-01 MED ORDER — INSULIN ASPART 100 UNIT/ML ~~LOC~~ SOLN
0.0000 [IU] | Freq: Three times a day (TID) | SUBCUTANEOUS | Status: DC
  Administered 2018-11-02 (×2): 2 [IU] via SUBCUTANEOUS

## 2018-11-01 MED ORDER — AMIODARONE HCL 200 MG PO TABS
100.0000 mg | ORAL_TABLET | Freq: Every day | ORAL | Status: DC
  Administered 2018-11-02 – 2018-11-04 (×3): 100 mg via ORAL
  Filled 2018-11-01 (×3): qty 1

## 2018-11-01 MED ORDER — TRAMADOL HCL 50 MG PO TABS
50.0000 mg | ORAL_TABLET | Freq: Two times a day (BID) | ORAL | Status: DC | PRN
  Administered 2018-11-02: 50 mg via ORAL
  Filled 2018-11-01: qty 1

## 2018-11-01 MED ORDER — VANCOMYCIN HCL IN DEXTROSE 1-5 GM/200ML-% IV SOLN
1000.0000 mg | Freq: Once | INTRAVENOUS | Status: AC
  Administered 2018-11-01: 1000 mg via INTRAVENOUS
  Filled 2018-11-01: qty 200

## 2018-11-01 MED ORDER — APIXABAN 5 MG PO TABS
5.0000 mg | ORAL_TABLET | Freq: Two times a day (BID) | ORAL | Status: DC
  Administered 2018-11-02 – 2018-11-04 (×6): 5 mg via ORAL
  Filled 2018-11-01 (×5): qty 1
  Filled 2018-11-01: qty 2

## 2018-11-01 MED ORDER — METOPROLOL TARTRATE 100 MG PO TABS
100.0000 mg | ORAL_TABLET | Freq: Two times a day (BID) | ORAL | Status: DC
  Administered 2018-11-02 – 2018-11-03 (×5): 100 mg via ORAL
  Filled 2018-11-01 (×6): qty 1

## 2018-11-01 MED ORDER — FUROSEMIDE 10 MG/ML IJ SOLN
40.0000 mg | Freq: Once | INTRAMUSCULAR | Status: AC
  Administered 2018-11-02: 40 mg via INTRAVENOUS
  Filled 2018-11-01: qty 4

## 2018-11-01 MED ORDER — PREDNISONE 5 MG PO TABS
5.0000 mg | ORAL_TABLET | Freq: Every day | ORAL | Status: DC
  Administered 2018-11-02 – 2018-11-04 (×3): 5 mg via ORAL
  Filled 2018-11-01 (×3): qty 1

## 2018-11-01 MED ORDER — FINASTERIDE 5 MG PO TABS
5.0000 mg | ORAL_TABLET | Freq: Every day | ORAL | Status: DC
  Administered 2018-11-02 – 2018-11-03 (×3): 5 mg via ORAL
  Filled 2018-11-01 (×3): qty 1

## 2018-11-01 MED ORDER — ACETAMINOPHEN 650 MG RE SUPP: 650.0000 mg | Freq: Four times a day (QID) | RECTAL | Status: DC | PRN

## 2018-11-01 MED ORDER — HYDRALAZINE HCL 25 MG PO TABS
25.0000 mg | ORAL_TABLET | Freq: Two times a day (BID) | ORAL | Status: DC
  Administered 2018-11-02 – 2018-11-04 (×6): 25 mg via ORAL
  Filled 2018-11-01 (×6): qty 1

## 2018-11-01 MED ORDER — PREGABALIN 100 MG PO CAPS
100.0000 mg | ORAL_CAPSULE | Freq: Two times a day (BID) | ORAL | Status: DC
  Administered 2018-11-02 – 2018-11-04 (×6): 100 mg via ORAL
  Filled 2018-11-01 (×6): qty 1

## 2018-11-01 MED ORDER — PILOCARPINE HCL 4 % OP SOLN
1.0000 [drp] | Freq: Three times a day (TID) | OPHTHALMIC | Status: DC
  Administered 2018-11-02 – 2018-11-04 (×7): 1 [drp] via OPHTHALMIC
  Filled 2018-11-01: qty 15

## 2018-11-01 MED ORDER — TAMSULOSIN HCL 0.4 MG PO CAPS
0.4000 mg | ORAL_CAPSULE | Freq: Two times a day (BID) | ORAL | Status: DC
  Administered 2018-11-02 – 2018-11-04 (×6): 0.4 mg via ORAL
  Filled 2018-11-01 (×6): qty 1

## 2018-11-01 NOTE — ED Provider Notes (Signed)
[facility] EMERGENCY DEPARTMENT Provider Note   CSN: [id number] Arrival date & time: 11/01/18  1054     History   Chief Complaint Chief Complaint  Patient presents with   Facial Swelling    HPI Jacob [name] is a 78 y.o. male.     Patient is a 78 year old male with extensive past medical history including congestive heart failure, COPD, coronary artery disease with CABG, diabetes, GERD, and paroxysmal A. fib.  He presents today for evaluation of facial swelling and redness.  He was seen yesterday with similar complaints.  He was diagnosed with cellulitis and discharged with doxycycline.  Since returning home, the redness and swelling has extended into his neck.  Patient feels anxious and somewhat short of breath.  He denies any fevers or chills.  The history is provided by the patient and a relative.    Past Medical History:  Diagnosis Date   Allergy    Anemia    Anxiety    Arthritis    Asthma    BPH (benign prostatic hyperplasia)    Cancer of kidney (HCC)    Cataract    CHF (congestive heart failure) (HCC)    Chronic kidney disease    chronic  kidney failure  kidney function at 42%   COPD (chronic obstructive pulmonary disease) (HCC)    Coronary artery disease    CABG  7 bypasses   Diabetes mellitus without complication (HCC)    GERD (gastroesophageal reflux disease)    Gout    Hepatitis    many years ago   Hyperlipidemia    Hypertension    Lumbar disc disease    Obesity    Paroxysmal atrial fibrillation (HCC)    Peripheral neuropathy    Polymyalgia rheumatica ([place])    maintained on Prednisone, Plaquenil. Followed by rhuematology every 4 months/[name].   Shortness of breath dyspnea    with exertion   Sleep apnea    CPAP   Trying to use    Patient Active Problem List   Diagnosis Date Noted   Paroxysmal atrial fibrillation ([place]) 08/22/2018   High risk medication use 08/22/2018   Chronic anticoagulation  08/22/2018   On amiodarone therapy 08/22/2018   Hypertensive heart disease 04/24/2018   CKD (chronic kidney disease) 11/27/2015   Chronic diastolic heart failure ([place]) 11/27/2015   Normocytic anemia 11/27/2015   AKI (acute kidney injury) ([place])    Acute kidney injury superimposed on chronic kidney disease ([place]) 10/02/2015   Hypotension 10/02/2015   Anorexia    Adjustment disorder with mixed emotional features 09/01/2015   Acute encephalopathy 08/29/2015   Elevated troponin 08/29/2015   Renal cancer ([place]) 08/28/2015   Spinal stenosis of lumbar region with radiculopathy 08/07/2015   COPD (chronic obstructive pulmonary disease) (HCC)    OSA (obstructive sleep apnea) 07/27/2015   HNP (herniated nucleus pulposus), lumbar 07/24/2015   PMR (polymyalgia rheumatica) ([place]) 09/09/2013   Rheumatoid arthritis ([place]) 04/13/2012   CAD (coronary artery disease) 12/19/2011   Insulin dependent diabetes mellitus ([place]) 12/19/2011   Neuropathy ([place]) 12/19/2011    Past Surgical History:  Procedure Laterality Date   [place], 2000   left   CATARACT EXTRACTION W/PHACO Right 11/28/2014   Procedure: CATARACT EXTRACTION PHACO AND INTRAOCULAR LENS PLACEMENT (IOC) RIGHT ;  Surgeon: Marylynn Pearson, MD;  Location: [place];  Service: Ophthalmology;  Laterality: Right;   CORONARY ARTERY BYPASS GRAFT  03/17/1995   Wynonia Lawman; followed every six  months.   HERNIA REPAIR     LUMBAR LAMINECTOMY/DECOMPRESSION MICRODISCECTOMY N/A 07/30/2015   Procedure: LUMBAR LAMINECTOMY DISCECTOMY ;  Surgeon: Ashok Pall, MD;  Location: [place] [facility];  Service: Neurosurgery;  Laterality: N/A;  LUMBAR LAMINECTOMY DISCECTOMY    LUMBAR LAMINECTOMY/DECOMPRESSION MICRODISCECTOMY N/A 09/06/2015   Procedure: Redo L3/4 Disectomy;  Surgeon: Ashok Pall, MD;  Location: [place] [facility];  Service: Neurosurgery;  Laterality: N/A;   PROSTATE SURGERY     TURP at [place].   TRANSURETHRAL RESECTION  OF PROSTATE          Home Medications    Prior to Admission medications   Medication Sig Start Date End Date Taking? Authorizing Provider  albuterol (PROVENTIL HFA;VENTOLIN HFA) 108 (90 BASE) MCG/ACT inhaler Inhale 2 puffs into the lungs every 6 (six) hours as needed for wheezing or shortness of breath. 07/16/13   Wardell Honour, MD  amiodarone (PACERONE) 200 MG tablet Take 100 mg by mouth daily.    [provider]  amLODipine (NORVASC) 10 MG tablet Take 1 tablet (10 mg total) by mouth daily. 05/16/18   Richardo Priest, MD  apixaban (ELIQUIS) 5 MG TABS tablet Take 5 mg by mouth 2 (two) times daily.    [provider]  atorvastatin (LIPITOR) 20 MG tablet Take 20mg  (1 tab) 2 days a week 08/23/18   Richardo Priest, MD  budesonide-formoterol [facility]) 160-4.5 MCG/ACT inhaler Inhale 2 puffs into the lungs 2 (two) times daily.    [provider]  calcitRIOL (ROCALTROL) 0.25 MCG capsule Take 0.25 mcg by mouth every Monday, Wednesday, and Friday.    [provider]  cefUROXime (CEFTIN) 250 MG tablet Take 250 mg by mouth 2 (two) times daily as needed.    [provider]  chlorpheniramine (CHLOR-TRIMETON) 4 MG tablet Take 4 mg by mouth at bedtime.    [provider]  cyanocobalamin 1000 MCG tablet Take 1,000 mcg by mouth daily.    [provider]  denosumab (PROLIA) 60 MG/ML SOSY injection Inject 60 mg into the skin every 6 (six) months.    [provider]  doxycycline (VIBRAMYCIN) 100 MG capsule Take 1 capsule (100 mg total) by mouth 2 (two) times daily for 10 days. 10/31/18 11/10/18  Tacy Learn, PA-C  ferrous sulfate 325 (65 FE) MG tablet Take 325 mg by mouth as directed. MWF    [provider]  finasteride (PROSCAR) 5 MG tablet Take 5 mg by mouth at bedtime.     [provider]  furosemide (LASIX) 40 MG tablet Take 40 mg by mouth daily. 06/05/18   [provider]  hydrALAZINE (APRESOLINE) 25 MG tablet  Take 25 mg by mouth 2 (two) times daily.    [provider]  insulin aspart protamine- aspart (NOVOLOG MIX 70/30) (70-30) 100 UNIT/ML injection Inject into the skin as directed.     [provider]  latanoprost (XALATAN) 0.005 % ophthalmic solution Place 1 drop into both eyes at bedtime.    [provider]  loratadine (CLARITIN) 10 MG tablet Take 10 mg by mouth daily.    [provider]  metoprolol tartrate (LOPRESSOR) 100 MG tablet Take 100 mg by mouth 2 (two) times daily.    [provider]  montelukast (SINGULAIR) 10 MG tablet Take 1 tablet (10 mg total) by mouth at bedtime. 07/27/13   Wardell Honour, MD  Multiple Vitamins-Minerals (PRESERVISION AREDS 2 PO) Take 2 tablets by mouth daily.     [provider]  mupirocin ointment (BACTROBAN) 2 % Place 1 application into the nose 2 (two) times daily. 10/31/18   Tacy Learn, PA-C  nitroGLYCERIN (NITROSTAT) 0.4 MG SL tablet Place 0.4 mg under the tongue every 5 (five) minutes as needed for chest pain.    [provider]  oxymetazoline (AFRIN) 0.05 % nasal spray Place 2 sprays into both nostrils at bedtime.    [provider]  pantoprazole (PROTONIX) 40 MG tablet Take 40 mg by mouth 2 (two) times daily.     [provider]  pilocarpine (PILOCAR) 4 % ophthalmic solution Place 1 drop into both eyes 3 (three) times daily.     [provider]  predniSONE (DELTASONE) 5 MG tablet Take 5 mg by mouth daily with breakfast.     [provider]  pregabalin (LYRICA) 75 MG capsule Take 100 mg by mouth 2 (two) times daily.     [provider]  SEMAGLUTIDE,0.25 OR 0.5MG /DOS, [place] Inject 1.5 mLs into the skin once a week. 0.5mg /0.38ml    [provider]  Tamsulosin HCl (FLOMAX) 0.4 MG CAPS Take 0.4 mg by mouth 2 (two) times daily.     [provider]    Family History Family History  Problem Relation Age of Onset   Hypertension Other     Diabetes Brother    Kidney failure Brother     Social History Social History   Tobacco Use   Smoking status: Former Smoker    Types: Cigarettes    Quit date: 03/16/1957    Years since quitting: 61.6   Smokeless tobacco: Never Used  Substance Use Topics   Alcohol use: No   Drug use: No     Allergies   Ace inhibitors, Actonel [risedronate], Ciprocinonide [fluocinolone], Flunisolide, Metformin and related, Sertraline, Sulindac, and Terazosin   Review of Systems Review of Systems  All other systems reviewed and are negative.    Physical Exam Updated Vital Signs BP (!) 146/77 (BP Location: Right Arm)    Pulse 74    Temp 98.2 F (36.8 C) (Oral)    Resp 16    Ht 5\' 11"  (1.803 m)    Wt 108.9 kg    SpO2 95%    BMI 33.47 kg/m   Physical Exam Vitals signs and nursing note reviewed.  Constitutional:      General: He is not in acute distress.    Appearance: He is well-developed. He is not diaphoretic.  HENT:     Head: Normocephalic and atraumatic.  Neck:     Musculoskeletal: Normal range of motion and neck supple.  Cardiovascular:     Rate and Rhythm: Normal rate and regular rhythm.     Heart sounds: No murmur. No friction rub.  Pulmonary:     Effort: Pulmonary effort is normal. No respiratory distress.     Breath sounds: Rales present. No wheezing.     Comments: Patient appears somewhat anxious.  There are rales in the bases bilaterally. Abdominal:     General: Bowel sounds are normal. There is no distension.     Palpations: Abdomen is soft.     Tenderness: There is no abdominal tenderness.  Musculoskeletal: Normal range of motion.     Right lower leg: Edema present.     Left lower leg: Edema present.     Comments: There is 2+ pitting edema of both lower extremities.  Skin:    General: Skin is warm and dry.  Neurological:     Mental  Status: He is alert and oriented to person, place, and time.     Coordination: Coordination normal.      ED Treatments /  Results  Labs (all labs ordered are listed, but only abnormal results are displayed) Labs Reviewed  BASIC METABOLIC PANEL  CBC WITH DIFFERENTIAL/PLATELET  BRAIN NATRIURETIC PEPTIDE    EKG None  Radiology Dg Chest Port 1 View  Result Date: 10/31/2018 CLINICAL DATA:  Chest pain and shortness of breath. EXAM: PORTABLE CHEST 1 VIEW COMPARISON:  12/01/2015. FINDINGS: Stable enlarged cardiac silhouette and post CABG changes. Mildly prominent pulmonary vasculature. Minimally prominent interstitial markings with improvement. Stable small calcified granuloma at the right lung base. Interval small left pleural effusion and mild left basilar airspace opacity. Diffuse osteopenia. IMPRESSION: 1. Interval small left pleural effusion and mild left basilar atelectasis or pneumonia. 2. Stable cardiomegaly and minimal chronic interstitial lung disease with resolved changes of congestive heart failure. Electronically Signed   By: Claudie Revering M.D.   On: 10/31/2018 13:06   Ct Maxillofacial Wo Cm  Result Date: 10/31/2018 CLINICAL DATA:  Recurring LEFT-sided facial swelling. EXAM: CT MAXILLOFACIAL WITHOUT CONTRAST TECHNIQUE: Multidetector CT imaging of the maxillofacial structures was performed. Multiplanar CT image reconstructions were also generated. COMPARISON:  None. FINDINGS: Osseous: Facial bones are intact, normally aligned and normal in mineralization throughout. No teeth. No evidence of residual dental abscess. Orbits: Periorbital and retro-orbital soft tissues are unremarkable. Sinuses: Chronic mucosal thickening and/or mucous retention cysts filling the bilateral maxillary sinuses. Additional mild mucosal thickening within the LEFT frontal sinus and sphenoid sinus. No fluid levels. Both ostiomeatal units are obstructed by the mucosal thickening, complete on the RIGHT and partial on the LEFT. Soft tissues: Diffuse ill-defined edema/inflammation of the subcutaneous soft tissues of the LEFT face, with overlying  skin thickening. No circumscribed fluid collection or abscess like collection identified within the soft tissues. Deeper parapharyngeal soft tissues are unremarkable. Soft tissues of the RIGHT face are unremarkable. Limited intracranial: No significant or unexpected finding. IMPRESSION: 1. Diffuse ill-defined edema/inflammation of the subcutaneous soft tissues of the LEFT face, with overlying skin thickening, most compatible with cellulitis. No circumscribed fluid collection or abscess like collection is identified within these soft tissues. 2. Chronic appearing paranasal sinus disease, as detailed above. 3. No acute appearing osseous abnormality. No evidence of osteomyelitis. Electronically Signed   By: Franki Cabot M.D.   On: 10/31/2018 13:54    Procedures Procedures (including critical care time)  Medications Ordered in ED Medications  vancomycin (VANCOCIN) IVPB 1000 mg/200 mL premix (has no administration in time range)     Initial Impression / Assessment and Plan / ED Course  I have reviewed the triage vital signs and the nursing notes.  Pertinent labs & imaging results that were available during my care of the patient were reviewed by me and considered in my medical decision making (see chart for details).  Patient presenting here with facial swelling which is now extending into his neck.  He was seen yesterday with similar symptoms, however it is now worsening despite oral antibiotics.  Patient afebrile, but white count 11,000.  As his cellulitis appears to be worsening, I feel as though patient will be in need of admission for IV antibiotics.  He was given IV vancomycin here and will be admitted to the family medicine service.  Final Clinical Impressions(s) / ED Diagnoses   Final diagnoses:  None    ED Discharge Orders    None  Veryl Speak, MD 11/01/18 [phone]

## 2018-11-01 NOTE — ED Triage Notes (Signed)
Pt was just recently seen for the facial swelling and now same is worse.

## 2018-11-01 NOTE — ED Notes (Signed)
Pt voiced concern regarding placement of CPAP d/t redness + swelling; Notified admitting doc and RT.

## 2018-11-01 NOTE — H&P (Addendum)
[facility] Admission History and Physical Service Pager: [phone]  Patient name: Jacob [name] Medical record number: [id number] Date of birth: [birth year] Age: 78 y.o. Gender: male  Primary Care Provider: Clinic, Jacob [name] Consultants: none Code Status: Full Emergency contact:Wife, Jacob [name](POA): [phone]; Daughter, Jacob [name]): [phone]  Chief Complaint: Left facial swelling  Assessment and Plan: Jacob [name] is a 78 y.o. male presenting with left facial swelling. PMH is significant for CAD, insulin-dependent T2DM, neuropathy, RA, PMR, OSA, COPD, CKD stage IV, HFpEF, paroxysmal atrial fibrillation on amiodarone and Eliquis.  L Facial cellulitis Patient reports that he had some redness on his face from a boil that he frequently gets on Friday.  He reported to the [place] yesterday and they told him to come to the [facility] emergency room.  Yesterday he had CT imaging which showed no signs of abscess or orbital involvement.  He was started on doxycycline and given return precautions.  Patient returned today given that his swelling had worsened and had spread into his neck.  He was started on IV vancomycin in the ED.  On my exam there was significant erythema of the neck and patient was reporting some trouble swallowing.  CT without of the soft tissue obtained in order to evaluate that patient is protecting airway.  No concern for orbital cellulitis at this time given that his extraocular movements are intact.  Will obtain blood cultures given that this has spread so quickly.  Could be secondary to patient's chronic prednisone use.  He reports that his diabetes is well controlled with an A1c of 6.2.  -Admit to telemetry, attending Dr. Nori [name] -continue IV vancomycin per pharmacy -Follow-up CT neck soft tissue -Obtain blood cultures although they will be after patient has received IV antibiotics -Monitor patient for any signs of orbital involvement or  airway compromise -Vitals per routine -Consider ENT evaluation if patient worsens  Acute on chronic HFpEF  CAD BNP elevated to 1675.  CXR with signs of fluid overload.  Patient with crackles on exam on left side although he is laying on his left side given his swelling in his neck.  Does have 3+ pitting BL LE.  Patient reports that he is compliant with his furosemide 40 mg at home.  He states that he has not been able to lay flat for years and periodically becomes short of breath on exertion but this has not significantly worsened. Patient on metoprolol 100 mg twice daily, amlodipine 10 mg daily, atorvastatin 20 mg.  COVID-19 negative -IV Lasix 40 mg -Continue home medications -Consider cardiology consult if patient's status worsens or unable to diurese -Strict I's and O's, daily weights  Paroxysmal atrial fibrillation on amiodarone and Eliquis Patient in sinus rhythm on 8/17 and EKG with RBBB, first-degree AV block which is previously seen. Will repeat EKG in a.m.  Patient is on amiodarone 100 mg daily as well as Eliquis 5 mg twice daily.   -Continue home Eliquis and amiodarone -EKG in a.m. -Telemetry  CKD stage IV Patient on calcitriol at home. Creatinine 2.85 in the ED.  Unclear patient's baseline as he follows at the [facility] as able and continue to monitor renal function -Trend BMP / urinary output -Replace electrolytes as indicated -Avoid nephrotoxic agents, ensure adequate renal perfusion  Insulin-dependent T2DM  Neuropathy Patient reports that he uses 70/30 insulin at home which he scales between 15 and 30 units based on his CBGs.  He states his last A1c that he knows it  was 6.2. -SSI, CBG's with meals and nightly -Continue home Lyrica -Restart home long-acting when he is having more p.o. intake  Rheumatoid arthritis  Patient reports taking 5 mg prednisone daily. -Continue chronic steroid  COPD  OSA  sarcoidosis Patient on Symbicort daily and albuterol as needed.   Also takes montelukast.  Likely the patient will not be able to use his CPAP for his OSA given the swelling on his face but will offer. -Continue home inhalers -CPAP nightly if patient able to tolerate with facial swelling  BPH -Continue home Flomax  FEN/GI: Carb modified/heart healthy Prophylaxis: Eliquis   Disposition: Admit to telemetry for IV antibiotics  History of Present Illness:  Jacob [name] is a 78 y.o. male presenting with worsening left facial swelling. Noticed area on his face on Friday. He has a problem with boils and has been treated at the [place]. He has never needed IV abx for them before. [facility]. He was given doxycycline as well as Bactroban to apply to his nose and had testing (CT which showed no abscess) and was told to return if it got worse. Patient having trouble breathing and redness was worse overnight.  He came in today due to the increased redness and has occasional trouble swallowing.   Patient reports he has COPD/Asthma and HFpEF and periodically has shortness of breath. He reports he has a hospital bed at home which he often has to keep elevated.  He uses 1 pillow.  He states that he has not had to increase the angle or use more pillows recently.  He states that at baseline he has shortness of breath with exertion and he does not believe this is worsened.  He periodically gets short of breath.  He does not use any oxygen at home.  He states that he occasionally has swelling in his leg but he does not believe it is worse than what it normally is today.  Review Of Systems: Per HPI with the following additions:   Review of Systems  Constitutional: Negative for fever.  HENT: Negative for congestion.   Eyes: Negative for blurred vision and double vision.  Respiratory: Positive for cough and shortness of breath.   Cardiovascular: Negative for chest pain, orthopnea and leg swelling.  Gastrointestinal: Positive for abdominal pain.  Negative for constipation, diarrhea and nausea.  Genitourinary: Negative for dysuria.  Skin: Positive for rash.  Neurological: Positive for weakness and headaches. Negative for dizziness.    Patient Active Problem List   Diagnosis Date Noted  . Paroxysmal atrial fibrillation ([place]) 08/22/2018  . High risk medication use 08/22/2018  . Chronic anticoagulation 08/22/2018  . On amiodarone therapy 08/22/2018  . Hypertensive heart disease 04/24/2018  . CKD (chronic kidney disease) 11/27/2015  . Chronic diastolic heart failure ([place]) 11/27/2015  . Normocytic anemia 11/27/2015  . AKI (acute kidney injury) ([place])   . Acute kidney injury superimposed on chronic kidney disease ([place]) 10/02/2015  . Hypotension 10/02/2015  . Anorexia   . Adjustment disorder with mixed emotional features 09/01/2015  . Acute encephalopathy 08/29/2015  . Elevated troponin 08/29/2015  . Renal cancer ([place]) 08/28/2015  . Spinal stenosis of lumbar region with radiculopathy 08/07/2015  . COPD (chronic obstructive pulmonary disease) ([place])   . OSA (obstructive sleep apnea) 07/27/2015  . HNP (herniated nucleus pulposus), lumbar 07/24/2015  . PMR (polymyalgia rheumatica) ([place]) 09/09/2013  . Rheumatoid arthritis ([place]) 04/13/2012  . CAD (coronary artery disease) 12/19/2011  . Insulin  dependent diabetes mellitus ([place]) 12/19/2011  . Neuropathy ([place]) 12/19/2011    Past Medical History: Past Medical History:  Diagnosis Date  . Allergy   . Anemia   . Anxiety   . Arthritis   . Asthma   . BPH (benign prostatic hyperplasia)   . Cancer of kidney ([place])   . Cataract   . CHF (congestive heart failure) ([place])   . Chronic kidney disease    chronic  kidney failure  kidney function at 42%  . COPD (chronic obstructive pulmonary disease) ([place])   . Coronary artery disease    CABG  7 bypasses  . Diabetes mellitus without complication ([place])   . GERD (gastroesophageal reflux disease)   . Gout   . Hepatitis    many years ago  .  Hyperlipidemia   . Hypertension   . Lumbar disc disease   . Obesity   . Paroxysmal atrial fibrillation (H[facility])   . Peripheral neuropathy   . Polymyalgia rheumatica (H[facility])    maintained on Prednisone, Plaquenil. Followed by rhuematology every 4 months/[name].  . Shortness of breath dyspnea    with exertion  . Sleep apnea    CPAP   Trying to use    Past Surgical History: Past Surgical History:  Procedure Laterality Date  . APPENDECTOMY    . CARDIAC CATHETERIZATION  1998, 2000   left  . CATARACT EXTRACTION W/PHACO Right 11/28/2014   Procedure: CATARACT EXTRACTION PHACO AND INTRAOCULAR LENS PLACEMENT ([place]) RIGHT ;  Surgeon: Marylynn Pearson, MD;  Location: [facility];  Service: Ophthalmology;  Laterality: Right;  . CORONARY ARTERY BYPASS GRAFT  03/17/1995   Wynonia Lawman; followed every six months.  Marland Kitchen HERNIA REPAIR    . LUMBAR LAMINECTOMY/DECOMPRESSION MICRODISCECTOMY N/A 07/30/2015   Procedure: LUMBAR LAMINECTOMY DISCECTOMY ;  Surgeon: Ashok Pall, MD;  Location: [facility];  Service: Neurosurgery;  Laterality: N/A;  LUMBAR LAMINECTOMY DISCECTOMY   . LUMBAR LAMINECTOMY/DECOMPRESSION MICRODISCECTOMY N/A 09/06/2015   Procedure: Redo L3/4 Disectomy;  Surgeon: Ashok Pall, MD;  Location: [facility];  Service: Neurosurgery;  Laterality: N/A;  . PROSTATE SURGERY     TURP at [place].  Marland Kitchen TRANSURETHRAL RESECTION OF PROSTATE      Social History: Social History   Tobacco Use  . Smoking status: Former Smoker    Types: Cigarettes    Quit date: 03/16/1957    Years since quitting: 61.6  . Smokeless tobacco: Never Used  Substance Use Topics  . Alcohol use: No  . Drug use: No   Additional social history: never smoker Please also refer to relevant sections of EMR.  Family History: Family History  Problem Relation Age of Onset  . Hypertension Other   . Diabetes Brother   . Kidney failure Brother     Allergies and Medications: Allergies  Allergen Reactions  . Ace Inhibitors Other (See Comments)     Probably nausea and vomiting per patient   . Actonel [Risedronate] Nausea And Vomiting  . Ciprocinonide [Fluocinolone] Other (See Comments)    Probably nausea and vomiting per patient  . Flunisolide Other (See Comments)    Probably nausea and vomiting per patient   . Metformin And Related Other (See Comments)    Probably nausea and vomiting per patient   . Sertraline Other (See Comments)    Probably nausea and vomiting per patient   . Sulindac Other (See Comments)    Probably nausea and vomiting per patient   . Terazosin Other (See Comments)    Probably nausea and  vomiting per patient    No current facility-administered medications on file prior to encounter.    Current Outpatient Medications on File Prior to Encounter  Medication Sig Dispense Refill  . albuterol (PROVENTIL HFA;VENTOLIN HFA) 108 (90 BASE) MCG/ACT inhaler Inhale 2 puffs into the lungs every 6 (six) hours as needed for wheezing or shortness of breath. 1 Inhaler 2  . amiodarone (PACERONE) 200 MG tablet Take 100 mg by mouth daily.    Marland Kitchen amLODipine (NORVASC) 10 MG tablet Take 1 tablet (10 mg total) by mouth daily. 90 tablet 1  . apixaban (ELIQUIS) 5 MG TABS tablet Take 5 mg by mouth 2 (two) times daily.    Marland Kitchen atorvastatin (LIPITOR) 20 MG tablet Take 20mg  (1 tab) 2 days a week 32 tablet 3  . budesonide-formoterol (SYMBICORT) 160-4.5 MCG/ACT inhaler Inhale 2 puffs into the lungs 2 (two) times daily.    . calcitRIOL (ROCALTROL) 0.25 MCG capsule Take 0.25 mcg by mouth every Monday, Wednesday, and Friday.    . cefUROXime (CEFTIN) 250 MG tablet Take 250 mg by mouth 2 (two) times daily as needed.    . chlorpheniramine (CHLOR-TRIMETON) 4 MG tablet Take 4 mg by mouth at bedtime.    . cyanocobalamin 1000 MCG tablet Take 1,000 mcg by mouth daily.    Marland Kitchen denosumab (PROLIA) 60 MG/ML SOSY injection Inject 60 mg into the skin every 6 (six) months.    . doxycycline (VIBRAMYCIN) 100 MG capsule Take 1 capsule (100 mg total) by mouth 2 (two)  times daily for 10 days. 20 capsule 0  . ferrous sulfate 325 (65 FE) MG tablet Take 325 mg by mouth as directed. MWF    . finasteride (PROSCAR) 5 MG tablet Take 5 mg by mouth at bedtime.     . furosemide (LASIX) 40 MG tablet Take 40 mg by mouth daily.    . hydrALAZINE (APRESOLINE) 25 MG tablet Take 25 mg by mouth 2 (two) times daily.    . insulin aspart protamine- aspart (NOVOLOG MIX 70/30) (70-30) 100 UNIT/ML injection Inject into the skin as directed.     . latanoprost (XALATAN) 0.005 % ophthalmic solution Place 1 drop into both eyes at bedtime.    Marland Kitchen loratadine (CLARITIN) 10 MG tablet Take 10 mg by mouth daily.    . metoprolol tartrate (LOPRESSOR) 100 MG tablet Take 100 mg by mouth 2 (two) times daily.    . montelukast (SINGULAIR) 10 MG tablet Take 1 tablet (10 mg total) by mouth at bedtime. 90 tablet 3  . Multiple Vitamins-Minerals (PRESERVISION AREDS 2 PO) Take 2 tablets by mouth daily.     . mupirocin ointment (BACTROBAN) 2 % Place 1 application into the nose 2 (two) times daily. 22 g 0  . nitroGLYCERIN (NITROSTAT) 0.4 MG SL tablet Place 0.4 mg under the tongue every 5 (five) minutes as needed for chest pain.    Marland Kitchen oxymetazoline (AFRIN) 0.05 % nasal spray Place 2 sprays into both nostrils at bedtime.    . pantoprazole (PROTONIX) 40 MG tablet Take 40 mg by mouth 2 (two) times daily.     . pilocarpine (PILOCAR) 4 % ophthalmic solution Place 1 drop into both eyes 3 (three) times daily.     . predniSONE (DELTASONE) 5 MG tablet Take 5 mg by mouth daily with breakfast.     . pregabalin (LYRICA) 75 MG capsule Take 100 mg by mouth 2 (two) times daily.     Marland Kitchen SEMAGLUTIDE,0.25 OR 0.5MG /DOS, [place] Inject 1.5 mLs into the skin  once a week. 0.5mg /0.33ml    . Tamsulosin HCl (FLOMAX) 0.4 MG CAPS Take 0.4 mg by mouth 2 (two) times daily.       Objective: BP (!) 146/77 (BP Location: Right Arm)   Pulse 74   Temp 98.2 F (36.8 C) (Oral)   Resp 16   Ht 5\' 11"  (1.803 m)   Wt 108.9 kg   SpO2 95%   BMI  33.47 kg/m  Exam: General: NAD, pleasant, laying in bed on left side Cardiovascular: RRR, no m/r/g, 3+ pitting BL LE edema to knees Respiratory: CTA on R with crackles noted on left, normal work of breathing on room air Gastrointestinal: soft, nontender, nondistended, normoactive BS MSK: moves 4 extremities equally Neuro: CN II-XII grossly intact Psych: AOx3, appropriate affect       Labs and Imaging: CBC BMET  Recent Labs  Lab 11/01/18 1548  WBC 11.3*  HGB 11.7*  HCT 37.2*  PLT 174   Recent Labs  Lab 11/01/18 1548  NA 140  K 4.3  CL 103  CO2 25  BUN 36*  CREATININE 2.87*  GLUCOSE 123*  CALCIUM 8.3*     BNP    Component Value Date/Time   BNP 1,675.9 (H) 11/01/2018 [place], [place], DO 11/01/2018, 5:03 PM PGY-3, [facility] Intern pager: [phone], text pages welcome

## 2018-11-01 NOTE — ED Notes (Signed)
ED TO INPATIENT HANDOFF REPORT  ED Nurse Name and Phone #: William Hamburger, RN   S Name/Age/Gender Jacob [name] 78 y.o. male Room/Bed: 036C/036C  Code Status   Code Status: Full Code  Home/SNF/Other Home Patient oriented to: situation Is this baseline? Yes   Triage Complete: Triage complete  Chief Complaint FACIAL SWELLING  Triage Note Pt was just recently seen for the facial swelling and now same is worse.    Allergies Allergies  Allergen Reactions  . Ace Inhibitors Other (See Comments)    Probably nausea and vomiting per patient   . Actonel [Risedronate] Nausea And Vomiting  . Ciprocinonide [Fluocinolone] Other (See Comments)    Probably nausea and vomiting per patient  . Flunisolide Other (See Comments)    Probably nausea and vomiting per patient   . Metformin And Related Other (See Comments)    Probably nausea and vomiting per patient   . Sertraline Other (See Comments)    Probably nausea and vomiting per patient   . Sulindac Other (See Comments)    Probably nausea and vomiting per patient   . Terazosin Other (See Comments)    Probably nausea and vomiting per patient     Level of Care/Admitting Diagnosis ED Disposition    ED Disposition Condition [place]: [facility] [[id number]]  Level of Care: Telemetry Cardiac [103]  Covid Evaluation: Confirmed COVID Negative  Diagnosis: Cellulitis [[id number]]  Admitting Physician: SHIRLEY, [place] [[id number]]  Attending Physician: Dickie La [[id number]]  Estimated length of stay: past midnight tomorrow  Certification:: I certify this patient will need inpatient services for at least 2 midnights  PT Class (Do Not Modify): Inpatient [101]  PT Acc Code (Do Not Modify): Private [1]       B Medical/Surgery History Past Medical History:  Diagnosis Date  . Allergy   . Anemia   . Anxiety   . Arthritis   . Asthma   . BPH (benign prostatic hyperplasia)   . Cancer of kidney ([place])   .  Cataract   . CHF (congestive heart failure) ([place])   . Chronic kidney disease    chronic  kidney failure  kidney function at 42%  . COPD (chronic obstructive pulmonary disease) ([place])   . Coronary artery disease    CABG  7 bypasses  . Diabetes mellitus without complication ([place])   . GERD (gastroesophageal reflux disease)   . Gout   . Hepatitis    many years ago  . Hyperlipidemia   . Hypertension   . Lumbar disc disease   . Obesity   . Paroxysmal atrial fibrillation (HCC)   . Peripheral neuropathy   . Polymyalgia rheumatica (HCC)    maintained on Prednisone, Plaquenil. Followed by rhuematology every 4 months/[name].  . Shortness of breath dyspnea    with exertion  . Sleep apnea    CPAP   Trying to use   Past Surgical History:  Procedure Laterality Date  . APPENDECTOMY    . CARDIAC CATHETERIZATION  1998, 2000   left  . CATARACT EXTRACTION W/PHACO Right 11/28/2014   Procedure: CATARACT EXTRACTION PHACO AND INTRAOCULAR LENS PLACEMENT ([place]) RIGHT ;  Surgeon: Marylynn Pearson, MD;  Location: [facility];  Service: Ophthalmology;  Laterality: Right;  . CORONARY ARTERY BYPASS GRAFT  03/17/1995   Wynonia Lawman; followed every six months.  Marland Kitchen HERNIA REPAIR    . LUMBAR LAMINECTOMY/DECOMPRESSION MICRODISCECTOMY N/A 07/30/2015   Procedure: LUMBAR LAMINECTOMY DISCECTOMY ;  Surgeon: Ashok Pall, MD;  Location: [facility]URO [facility]S;  Service: Neurosurgery;  Laterality: N/A;  LUMBAR LAMINECTOMY DISCECTOMY   . LUMBAR LAMINECTOMY/DECOMPRESSION MICRODISCECTOMY N/A 09/06/2015   Procedure: Redo L3/4 Disectomy;  Surgeon: Ashok Pall, MD;  Location: [facility]S;  Service: Neurosurgery;  Laterality: N/A;  . PROSTATE SURGERY     TURP at [place].  Marland Kitchen TRANSURETHRAL RESECTION OF PROSTATE       A IV Location/Drains/Wounds Patient Lines/Drains/Airways Status   Active Line/Drains/Airways    Name:   Placement date:   Placement time:   Site:   Days:   Peripheral IV 11/01/18 Right Antecubital   11/01/18    1602    Antecubital   less than  1   Incision (Closed) 09/06/15 Back Other (Comment)   09/06/15    2016     1152   Wound / Incision (Open or Dehisced) 08/30/15   Lower   08/30/15    1503    -   1159          Intake/Output Last 24 hours No intake or output data in the 24 hours ending 11/01/18 2131  Labs/Imaging Results for orders placed or performed during the hospital encounter of 11/01/18 (from the past 48 hour(s))  Basic metabolic panel     Status: Abnormal   Collection Time: 11/01/18  3:48 PM  Result Value Ref Range   Sodium 140 135 - 145 mmol/L   Potassium 4.3 3.5 - 5.1 mmol/L   Chloride 103 98 - 111 mmol/L   CO2 25 22 - 32 mmol/L   Glucose, Bld 123 (H) 70 - 99 mg/dL   BUN 36 (H) 8 - 23 mg/dL   Creatinine, Ser 2.87 (H) 0.61 - 1.24 mg/dL   Calcium 8.3 (L) 8.9 - 10.3 mg/dL   GFR calc non Af Amer 20 (L) >60 mL/min   GFR calc Af Amer 23 (L) >60 mL/min   Anion gap 12 5 - 15    Comment: Performed at [facility], [address], [place], [place] [address]  CBC with Differential     Status: Abnormal   Collection Time: 11/01/18  3:48 PM  Result Value Ref Range   WBC 11.3 (H) 4.0 - 10.5 K/uL   RBC 3.72 (L) 4.22 - 5.81 MIL/uL   Hemoglobin 11.7 (L) 13.0 - 17.0 g/dL   HCT 37.2 (L) 39.0 - 52.0 %   MCV 100.0 80.0 - 100.0 fL   MCH 31.5 26.0 - 34.0 pg   MCHC 31.5 30.0 - 36.0 g/dL   RDW 14.9 11.5 - 15.5 %   Platelets 174 150 - 400 K/uL   nRBC 0.0 0.0 - 0.2 %   Neutrophils Relative % 75 %   Neutro Abs 8.5 (H) 1.7 - 7.7 K/uL   Lymphocytes Relative 13 %   Lymphs Abs 1.5 0.7 - 4.0 K/uL   Monocytes Relative 11 %   Monocytes Absolute 1.2 (H) 0.1 - 1.0 K/uL   Eosinophils Relative 0 %   Eosinophils Absolute 0.0 0.0 - 0.5 K/uL   Basophils Relative 1 %   Basophils Absolute 0.1 0.0 - 0.1 K/uL   Immature Granulocytes 0 %   Abs Immature Granulocytes 0.05 0.00 - 0.07 K/uL    Comment: Performed at [facility], [place] [address], [place], [place] [address]  Brain natriuretic peptide     Status: Abnormal    Collection Time: 11/01/18  4:11 PM  Result Value Ref Range   B Natriuretic Peptide 1,675.9 (H) 0.0 - 100.0  pg/mL    Comment: Performed at [facility], [place] [address] [place], [place] [address]  SARS Coronavirus 2 [facility] order, Performed in [facility] hospital lab) Nasopharyngeal Nasopharyngeal Swab     Status: None   Collection Time: 11/01/18  4:57 PM   Specimen: Nasopharyngeal Swab  Result Value Ref Range   SARS Coronavirus 2 NEGATIVE NEGATIVE    Comment: (NOTE) If result is NEGATIVE SARS-CoV-2 target nucleic acids are NOT DETECTED. The SARS-CoV-2 RNA is generally detectable in upper and lower  respiratory specimens during the acute phase of infection. The lowest  concentration of SARS-CoV-2 viral copies this assay can detect is 250  copies / mL. A negative result does not preclude SARS-CoV-2 infection  and should not be used as the sole basis for treatment or other  patient management decisions.  A negative result may occur with  improper specimen collection / handling, submission of specimen other  than nasopharyngeal swab, presence of viral mutation(s) within the  areas targeted by this assay, and inadequate number of viral copies  (<250 copies / mL). A negative result must be combined with clinical  observations, patient history, and epidemiological information. If result is POSITIVE SARS-CoV-2 target nucleic acids are DETECTED. The SARS-CoV-2 RNA is generally detectable in upper and lower  respiratory specimens dur ing the acute phase of infection.  Positive  results are indicative of active infection with SARS-CoV-2.  Clinical  correlation with patient history and other diagnostic information is  necessary to determine patient infection status.  Positive results do  not rule out bacterial infection or co-infection with other viruses. If result is PRESUMPTIVE POSTIVE SARS-CoV-2 nucleic acids MAY BE PRESENT.   A presumptive positive result was obtained on the  submitted specimen  and confirmed on repeat testing.  While 2019 novel coronavirus  (SARS-CoV-2) nucleic acids may be present in the submitted sample  additional confirmatory testing may be necessary for epidemiological  and / or clinical management purposes  to differentiate between  SARS-CoV-2 and other Sarbecovirus currently known to infect humans.  If clinically indicated additional testing with an alternate test  methodology [phone]) is advised. The SARS-CoV-2 RNA is generally  detectable in upper and lower respiratory sp ecimens during the acute  phase of infection. The expected result is Negative. Fact Sheet for Patients:  StrictlyIdeas.no Fact Sheet for Healthcare Providers: BankingDealers.co.za This test is not yet approved or cleared by the [place] FDA and has been authorized for detection and/or diagnosis of SARS-CoV-2 by FDA under an Emergency Use Authorization (EUA).  This EUA will remain in effect (meaning this test can be used) for the duration of the COVID-19 declaration under Section 564(b)(1) of the Act, 21 U.S.C. section 360bbb-3(b)(1), unless the authorization is terminated or revoked sooner. Performed at [facility], [place] [address] [place], [place] [address]    Ct Soft Tissue Neck Wo Contrast  Result Date: 11/01/2018 CLINICAL DATA:  Sore throat/stridor. Epiglottitis or tonsillitis suspected. Left neck and face swelling. EXAM: CT NECK WITHOUT CONTRAST TECHNIQUE: Multidetector CT imaging of the neck was performed following the standard protocol without intravenous contrast. COMPARISON:  CT face 10/31/2018 FINDINGS: Pharynx and larynx: Normal. No mass or swelling. Salivary glands: No inflammation, mass, or stone. Thyroid: Negative Lymph nodes: No enlarged or pathologic lymph nodes in the neck. Vascular: Limited vascular evaluation without intravenous contrast. Atherosclerotic calcification in the carotid  bifurcation bilaterally. Limited intracranial: No acute abnormality. Visualized orbits: Bilateral cataract surgery. No mass or edema in  the orbit Mastoids and visualized paranasal sinuses: Extensive mucosal edema in the maxillary sinus bilaterally. Mild mucosal edema in the sphenoid and ethmoid sinuses bilaterally. Mastoid sinus is clear bilaterally. Skeleton: Cervical spondylosis.  No acute skeletal abnormality. Upper chest: Lung apices clear bilaterally. Other: Soft tissue swelling of the left face and neck. There is skin thickening with infiltration of subcutaneous tissue. There is also thickening of the platysmas muscle and edema deep to the platysmas muscle which shows mild progression. No abscess. Unenhanced CT is less accurate in detecting abscess compared to contrast-enhanced CT. IMPRESSION: Diffuse edema in the left neck with skin thickening and subcutaneous edema. Mild progression of thickening of the platysmas muscle and edema deep to the platysmas muscle. Findings are most likely due to cellulitis. No definite abscess given unenhanced imaging. Electronically Signed   By: Franchot Gallo M.D.   On: 11/01/2018 20:36   Dg Chest Port 1 View  Result Date: 11/01/2018 CLINICAL DATA:  Shortness of breath EXAM: PORTABLE CHEST 1 VIEW COMPARISON:  10/31/2018 FINDINGS: Prior CABG. Cardiomegaly. Calcified granuloma at the right lung base. Small left pleural effusion with left base atelectasis again noted, unchanged. No acute bony abnormality. IMPRESSION: Cardiomegaly. Small left pleural effusion with left base atelectasis. Electronically Signed   By: Rolm Baptise M.D.   On: 11/01/2018 16:14   Dg Chest Port 1 View  Result Date: 10/31/2018 CLINICAL DATA:  Chest pain and shortness of breath. EXAM: PORTABLE CHEST 1 VIEW COMPARISON:  12/01/2015. FINDINGS: Stable enlarged cardiac silhouette and post CABG changes. Mildly prominent pulmonary vasculature. Minimally prominent interstitial markings with improvement.  Stable small calcified granuloma at the right lung base. Interval small left pleural effusion and mild left basilar airspace opacity. Diffuse osteopenia. IMPRESSION: 1. Interval small left pleural effusion and mild left basilar atelectasis or pneumonia. 2. Stable cardiomegaly and minimal chronic interstitial lung disease with resolved changes of congestive heart failure. Electronically Signed   By: Claudie Revering M.D.   On: 10/31/2018 13:06   Ct Maxillofacial Wo Cm  Result Date: 10/31/2018 CLINICAL DATA:  Recurring LEFT-sided facial swelling. EXAM: CT MAXILLOFACIAL WITHOUT CONTRAST TECHNIQUE: Multidetector CT imaging of the maxillofacial structures was performed. Multiplanar CT image reconstructions were also generated. COMPARISON:  None. FINDINGS: Osseous: Facial bones are intact, normally aligned and normal in mineralization throughout. No teeth. No evidence of residual dental abscess. Orbits: Periorbital and retro-orbital soft tissues are unremarkable. Sinuses: Chronic mucosal thickening and/or mucous retention cysts filling the bilateral maxillary sinuses. Additional mild mucosal thickening within the LEFT frontal sinus and sphenoid sinus. No fluid levels. Both ostiomeatal units are obstructed by the mucosal thickening, complete on the RIGHT and partial on the LEFT. Soft tissues: Diffuse ill-defined edema/inflammation of the subcutaneous soft tissues of the LEFT face, with overlying skin thickening. No circumscribed fluid collection or abscess like collection identified within the soft tissues. Deeper parapharyngeal soft tissues are unremarkable. Soft tissues of the RIGHT face are unremarkable. Limited intracranial: No significant or unexpected finding. IMPRESSION: 1. Diffuse ill-defined edema/inflammation of the subcutaneous soft tissues of the LEFT face, with overlying skin thickening, most compatible with cellulitis. No circumscribed fluid collection or abscess like collection is identified within these  soft tissues. 2. Chronic appearing paranasal sinus disease, as detailed above. 3. No acute appearing osseous abnormality. No evidence of osteomyelitis. Electronically Signed   By: Franki Cabot M.D.   On: 10/31/2018 13:54    Pending Labs [employer] (From admission, onward)    Start     Ordered  11/02/18 1610  Basic metabolic panel  Tomorrow morning,   R     11/01/18 2009   11/02/18 0500  CBC  Tomorrow morning,   R     11/01/18 2009   11/01/18 2013  Culture, blood (routine x 2)  BLOOD CULTURE X 2,   R (with STAT occurrences)     11/01/18 2012          Vitals/Pain Today's Vitals   11/01/18 1900 11/01/18 1915 11/01/18 2015 11/01/18 2030  BP: (!) 159/78 (!) 158/86 (!) 152/82 (!) 146/80  Pulse: 82 82 (!) 55 82  Resp:    16  Temp:      TempSrc:      SpO2: 93% 94%  92%  Weight:      Height:      PainSc:        Isolation Precautions No active isolations  Medications Medications  amiodarone (PACERONE) tablet 100 mg (has no administration in time range)  amLODipine (NORVASC) tablet 10 mg (has no administration in time range)  atorvastatin (LIPITOR) tablet 20 mg (has no administration in time range)  hydrALAZINE (APRESOLINE) tablet 25 mg (has no administration in time range)  metoprolol tartrate (LOPRESSOR) tablet 100 mg (has no administration in time range)  calcitRIOL (ROCALTROL) capsule 0.25 mcg (has no administration in time range)  predniSONE (DELTASONE) tablet 5 mg (has no administration in time range)  pantoprazole (PROTONIX) EC tablet 40 mg (has no administration in time range)  tamsulosin (FLOMAX) capsule 0.4 mg (has no administration in time range)  apixaban (ELIQUIS) tablet 5 mg (has no administration in time range)  pregabalin (LYRICA) capsule 100 mg (has no administration in time range)  mometasone-formoterol (DULERA) 200-5 MCG/ACT inhaler 2 puff (has no administration in time range)  montelukast (SINGULAIR) tablet 10 mg (has no administration in time range)   latanoprost (XALATAN) 0.005 % ophthalmic solution 1 drop (has no administration in time range)  pilocarpine (PILOCAR) 4 % ophthalmic solution 1 drop (has no administration in time range)  finasteride (PROSCAR) tablet 5 mg (has no administration in time range)  acetaminophen (TYLENOL) tablet 650 mg (has no administration in time range)    Or  acetaminophen (TYLENOL) suppository 650 mg (has no administration in time range)  insulin aspart (novoLOG) injection 0-9 Units (has no administration in time range)  furosemide (LASIX) injection 40 mg (has no administration in time range)  traMADol (ULTRAM) tablet 50 mg (has no administration in time range)  vancomycin (VANCOCIN) IVPB 1000 mg/200 mL premix (0 mg Intravenous Stopped 11/01/18 1756)    Mobility walks with person assist Low fall risk   Focused Assessments Pulmonary Assessment Handoff:  Lung sounds:   O2 Device: Room Air        R Recommendations: See Admitting Provider Note  Report given to:   Additional Notes: Swelling + redness on left side of face; ABX given; pt in hospital bed.

## 2018-11-02 DIAGNOSIS — E1022 Type 1 diabetes mellitus with diabetic chronic kidney disease: Secondary | ICD-10-CM

## 2018-11-02 DIAGNOSIS — N183 Chronic kidney disease, stage 3 (moderate): Secondary | ICD-10-CM

## 2018-11-02 DIAGNOSIS — R269 Unspecified abnormalities of gait and mobility: Secondary | ICD-10-CM

## 2018-11-02 DIAGNOSIS — J449 Chronic obstructive pulmonary disease, unspecified: Secondary | ICD-10-CM

## 2018-11-02 DIAGNOSIS — Z8679 Personal history of other diseases of the circulatory system: Secondary | ICD-10-CM

## 2018-11-02 LAB — BASIC METABOLIC PANEL
Anion gap: 10 (ref 5–15)
BUN: 33 mg/dL — ABNORMAL HIGH (ref 8–23)
CO2: 24 mmol/L (ref 22–32)
Calcium: 7.8 mg/dL — ABNORMAL LOW (ref 8.9–10.3)
Chloride: 107 mmol/L (ref 98–111)
Creatinine, Ser: 2.88 mg/dL — ABNORMAL HIGH (ref 0.61–1.24)
GFR calc Af Amer: 23 mL/min — ABNORMAL LOW (ref >60)
GFR calc non Af Amer: 20 mL/min — ABNORMAL LOW (ref >60)
Glucose, Bld: 102 mg/dL — ABNORMAL HIGH (ref 70–99)
Potassium: 3.9 mmol/L (ref 3.5–5.1)
Sodium: 141 mmol/L (ref 135–145)

## 2018-11-02 LAB — CBC
HCT: 31.5 % — ABNORMAL LOW (ref 39.0–52.0)
Hemoglobin: 10 g/dL — ABNORMAL LOW (ref 13.0–17.0)
MCH: 31.3 pg (ref 26.0–34.0)
MCHC: 31.7 g/dL (ref 30.0–36.0)
MCV: 98.4 fL (ref 80.0–100.0)
Platelets: 146 10*3/uL — ABNORMAL LOW (ref 150–400)
RBC: 3.2 MIL/uL — ABNORMAL LOW (ref 4.22–5.81)
RDW: 14.6 % (ref 11.5–15.5)
WBC: 8.5 10*3/uL (ref 4.0–10.5)
nRBC: 0 % (ref 0.0–0.2)

## 2018-11-02 LAB — GLUCOSE, CAPILLARY
Glucose-Capillary: 94 mg/dL (ref 70–99)
Glucose-Capillary: 168 mg/dL — ABNORMAL HIGH (ref 70–99)
Glucose-Capillary: 185 mg/dL — ABNORMAL HIGH (ref 70–99)
Glucose-Capillary: 163 mg/dL — ABNORMAL HIGH (ref 70–99)

## 2018-11-02 LAB — MRSA PCR SCREENING: MRSA by PCR: POSITIVE — AB

## 2018-11-02 MED ORDER — SODIUM CHLORIDE 0.9 % IV SOLN
INTRAVENOUS | Status: DC | PRN
  Administered 2018-11-02 – 2018-11-03 (×2): via INTRAVENOUS

## 2018-11-02 MED ORDER — ACETAMINOPHEN 325 MG PO TABS
650.0000 mg | ORAL_TABLET | Freq: Four times a day (QID) | ORAL | Status: DC
  Administered 2018-11-02 – 2018-11-04 (×7): 650 mg via ORAL
  Filled 2018-11-02 (×7): qty 2

## 2018-11-02 MED ORDER — POLYETHYLENE GLYCOL 3350 17 G PO PACK
17.0000 g | PACK | Freq: Every day | ORAL | Status: DC | PRN
  Filled 2018-11-02: qty 1

## 2018-11-02 MED ORDER — VANCOMYCIN HCL 10 G IV SOLR: 2000.0000 mg | INTRAVENOUS | Status: DC

## 2018-11-02 MED ORDER — VANCOMYCIN VARIABLE DOSE PER UNSTABLE RENAL FUNCTION (PHARMACIST DOSING): Status: DC

## 2018-11-02 MED ORDER — FUROSEMIDE 10 MG/ML IJ SOLN
80.0000 mg | Freq: Once | INTRAMUSCULAR | Status: AC
  Administered 2018-11-02: 80 mg via INTRAVENOUS
  Filled 2018-11-02: qty 8

## 2018-11-02 MED ORDER — RAMELTEON 8 MG PO TABS
8.0000 mg | ORAL_TABLET | Freq: Every day | ORAL | Status: DC
  Administered 2018-11-02 – 2018-11-03 (×2): 8 mg via ORAL
  Filled 2018-11-02 (×4): qty 1

## 2018-11-02 MED ORDER — ACETAMINOPHEN 650 MG RE SUPP: 650.0000 mg | Freq: Four times a day (QID) | RECTAL | Status: DC

## 2018-11-02 MED ORDER — VANCOMYCIN HCL 10 G IV SOLR
1250.0000 mg | Freq: Once | INTRAVENOUS | Status: AC
  Administered 2018-11-02: 1250 mg via INTRAVENOUS
  Filled 2018-11-02: qty 1250

## 2018-11-02 NOTE — Progress Notes (Addendum)
Spoke to Daughter- Hilda Blades (medical healthcare POA) Reports pt will get delirious with most pain medications (hallucination, verbally aggressive)    Patient having 8 out of 10 pain in back- gave tramadol- will continue to monitor.    OT asked if can order K-pad  Paged MD- asked if she can call Daughter to give update and if can order K-pad

## 2018-11-02 NOTE — Progress Notes (Signed)
Patient reported he has not walked in a week.   Patient was sat in chair for breakfast.   Patient did not call out for assistance to bathroom- got up and started walking by himself.   RN saw- came to assist patient to get to bathroom.    I provided education - asked patient not to get up by himself- to call before to prevent falling.   Assisted patient back to bed and placed bed alarm on.    Also asked patient to urinate in urinal so we can measure urination

## 2018-11-02 NOTE — Progress Notes (Signed)
Family Medicine Teaching Service Daily Progress Note Intern Pager: [phone]  Patient name: Jacob [name] Medical record number: [id number] Date of birth: [birth year] Age: 78 y.o. Gender: male  Primary Care Provider: Clinic, [facility] Consultants: None Code Status: Full  Pt Overview and Major Events to Date:  8/18-patient admitted to [facility] for facial cellulitis  Assessment and Plan: Jacob [name] is a 78 y.o. male presenting with left facial swelling. PMH is significant for CAD, insulin-dependent T2DM, neuropathy, RA, PMR, OSA, COPD, CKD stage IV, HFpEF, paroxysmal atrial fibrillation on amiodarone and Eliquis.  Left facial cellulitis Patient had a boil on the left side of his face on Friday.  He went to the [place] 8/17 and they told him he needed to come to the [facility] emergency room.  CT 8/17 showed no signs of abscess or oral involvement.  He was started on doxycycline given for precautions, and discharged.  Patient returned 8/18 due to increased swelling including spread to his neck.  He was started on IV Vanco in the ED 8/18.  Significant edema and erythema of the face and neck on exam.  Extraocular movements and intact.  Blood cultures collected although they will most likely be negative due to patient's antibiotic status. - Continue IV vancomycin dosed by pharmacy - Follow blood cultures no growth after 24 hours - Monitor for signs of orbital involvement or airway compromise -Consider ENT if patient worsens  Acute on chronic HFpEF BNP on admission 6035.  Chest x-ray showed signs of overload.  Crackles and left side on admission exam.  Class III bilateral lower extremity pitting edema.  Patient has been unable to lie flat for "years".  Patient reports good compliance with home furosemide.Home meds include metoprolol 100 mg twice daily, amlodipine 10 mg daily, atorvastatin 20 mg COVID test was negative. - IV Lasix 40 mg with little response -IV Lasix 80 mg -Continue home  meds -Cardiology consult if patient worsens - I's/O's  Paroxysmal atrial fibrillation on amiodarone and Eliquis On 8/17 EKG showed right bundle block, first-degree AV block which was previously seen.  Patient takes amiodarone 100 mg daily and Eliquis 5 mg twice daily. -Continue home meds -EKG will be repeated this morning -Telemetry  CKD stage IV Patient home meds include Calcitrol.  Creatinine on admission was 2.85.  Patient follows with [facility] so unable to see what baseline creatinine is. - Diuresis stable and continue to monitor renal function -Morning BMPs -I's/O's -Replace electrolytes as needed - Avoid nephrotoxic agents  Insulin-dependent type 2 diabetes mellitus with neuropathy Patient uses a 70/30 insulin at home which he scales based on CBGs last A1c reported by patient was 6.2. -SSI, CBG units with meals and nightly -Continue home Lyrica - Restart home long-acting when he has more p.o. intake  Rheumatoid arthritis Home meds include daily 5 mg prednisone -Continue home meds  COPD/OSA/sarcoidosis Patient home meds include Symbicort daily and albuterol as needed.  Also takes montelukast.  Patient may be unable to use CPAP for OSA due to facial cellulitis. -Continue home medications - CPAP nightly if patient can tolerate  BPH -Continue home Flomax  FEN/GI: Carb modified/heart healthy Prophylaxis: Eliquis *  Disposition: Home  Subjective:  Patient doing well this morning.  Reports he has no difficulty breathing with regards to throat closing up.  Says that he has been short of breath and took a breathing treatment this morning.  Patient says that he has a CPAP but he feels anxious and cannot wear it.  Patient also reports he is having anxiety because of the hospitalization.  Denies any chest pain.  Reports he had a fever overnight.  T-max overnight was 100.7.  Objective: Temp:  [98.2 F (36.8 C)-100.7 F (38.2 C)] 98.2 F (36.8 C) (08/19 2119) Pulse  Rate:  [55-88] 80 (08/19 0632) Resp:  [15-20] 15 (08/19 4174) BP: (145-171)/(63-87) 155/71 (08/19 0632) SpO2:  [91 %-99 %] 96 % (08/19 0814) Weight:  [103.9 kg-108.9 kg] 103.9 kg (08/19 4818) General: Alert and cooperative and appears to be in no acute distress resting comfortably on left side in bed. HEENT: Neck non-tender without lymphadenopathy, masses or thyromegaly Cardio: Normal S1 and S2, no S3 or S4. Rhythm is regular. No murmurs or rubs.   Pulm: Normal respiratory effort.  Diffuse expiratory wheezes bilaterally Abdomen: Bowel sounds normal. Abdomen soft and non-tender.  Extremities: 2+ edema in lower extremities bilaterally Neuro: Cranial nerves grossly intact   Laboratory: Recent Labs  Lab 10/31/18 1139 11/01/18 1548 11/02/18 0502  WBC 11.0* 11.3* 8.5  HGB 10.6* 11.7* 10.0*  HCT 34.5* 37.2* 31.5*  PLT 163 174 146*   Recent Labs  Lab 10/31/18 1139 11/01/18 1548  NA 137 140  K 4.3 4.3  CL 101 103  CO2 25 25  BUN 32* 36*  CREATININE 2.63* 2.87*  CALCIUM 8.2* 8.3*  GLUCOSE 197* 123*     Imaging/Diagnostic Tests: Ct Soft Tissue Neck Wo Contrast  Result Date: 11/01/2018 CLINICAL DATA:  Sore throat/stridor. Epiglottitis or tonsillitis suspected. Left neck and face swelling. EXAM: CT NECK WITHOUT CONTRAST TECHNIQUE: Multidetector CT imaging of the neck was performed following the standard protocol without intravenous contrast. COMPARISON:  CT face 10/31/2018 FINDINGS: Pharynx and larynx: Normal. No mass or swelling. Salivary glands: No inflammation, mass, or stone. Thyroid: Negative Lymph nodes: No enlarged or pathologic lymph nodes in the neck. Vascular: Limited vascular evaluation without intravenous contrast. Atherosclerotic calcification in the carotid bifurcation bilaterally. Limited intracranial: No acute abnormality. Visualized orbits: Bilateral cataract surgery. No mass or edema in the orbit Mastoids and visualized paranasal sinuses: Extensive mucosal edema in the  maxillary sinus bilaterally. Mild mucosal edema in the sphenoid and ethmoid sinuses bilaterally. Mastoid sinus is clear bilaterally. Skeleton: Cervical spondylosis.  No acute skeletal abnormality. Upper chest: Lung apices clear bilaterally. Other: Soft tissue swelling of the left face and neck. There is skin thickening with infiltration of subcutaneous tissue. There is also thickening of the platysmas muscle and edema deep to the platysmas muscle which shows mild progression. No abscess. Unenhanced CT is less accurate in detecting abscess compared to contrast-enhanced CT. IMPRESSION: Diffuse edema in the left neck with skin thickening and subcutaneous edema. Mild progression of thickening of the platysmas muscle and edema deep to the platysmas muscle. Findings are most likely due to cellulitis. No definite abscess given unenhanced imaging. Electronically Signed   By: Franchot Gallo M.D.   On: 11/01/2018 20:36   Dg Chest Port 1 View  Result Date: 11/01/2018 CLINICAL DATA:  Shortness of breath EXAM: PORTABLE CHEST 1 VIEW COMPARISON:  10/31/2018 FINDINGS: Prior CABG. Cardiomegaly. Calcified granuloma at the right lung base. Small left pleural effusion with left base atelectasis again noted, unchanged. No acute bony abnormality. IMPRESSION: Cardiomegaly. Small left pleural effusion with left base atelectasis. Electronically Signed   By: Rolm Baptise M.D.   On: 11/01/2018 16:14   Dg Chest Port 1 View  Result Date: 10/31/2018 CLINICAL DATA:  Chest pain and shortness of breath. EXAM: PORTABLE CHEST 1 VIEW COMPARISON:  12/01/2015. FINDINGS: Stable enlarged cardiac silhouette and post CABG changes. Mildly prominent pulmonary vasculature. Minimally prominent interstitial markings with improvement. Stable small calcified granuloma at the right lung base. Interval small left pleural effusion and mild left basilar airspace opacity. Diffuse osteopenia. IMPRESSION: 1. Interval small left pleural effusion and mild left  basilar atelectasis or pneumonia. 2. Stable cardiomegaly and minimal chronic interstitial lung disease with resolved changes of congestive heart failure. Electronically Signed   By: Claudie Revering M.D.   On: 10/31/2018 13:06   Ct Maxillofacial Wo Cm  Result Date: 10/31/2018 CLINICAL DATA:  Recurring LEFT-sided facial swelling. EXAM: CT MAXILLOFACIAL WITHOUT CONTRAST TECHNIQUE: Multidetector CT imaging of the maxillofacial structures was performed. Multiplanar CT image reconstructions were also generated. COMPARISON:  None. FINDINGS: Osseous: Facial bones are intact, normally aligned and normal in mineralization throughout. No teeth. No evidence of residual dental abscess. Orbits: Periorbital and retro-orbital soft tissues are unremarkable. Sinuses: Chronic mucosal thickening and/or mucous retention cysts filling the bilateral maxillary sinuses. Additional mild mucosal thickening within the LEFT frontal sinus and sphenoid sinus. No fluid levels. Both ostiomeatal units are obstructed by the mucosal thickening, complete on the RIGHT and partial on the LEFT. Soft tissues: Diffuse ill-defined edema/inflammation of the subcutaneous soft tissues of the LEFT face, with overlying skin thickening. No circumscribed fluid collection or abscess like collection identified within the soft tissues. Deeper parapharyngeal soft tissues are unremarkable. Soft tissues of the RIGHT face are unremarkable. Limited intracranial: No significant or unexpected finding. IMPRESSION: 1. Diffuse ill-defined edema/inflammation of the subcutaneous soft tissues of the LEFT face, with overlying skin thickening, most compatible with cellulitis. No circumscribed fluid collection or abscess like collection is identified within these soft tissues. 2. Chronic appearing paranasal sinus disease, as detailed above. 3. No acute appearing osseous abnormality. No evidence of osteomyelitis. Electronically Signed   By: Franki Cabot M.D.   On: 10/31/2018 13:54     Gifford Shave, MD 11/02/2018, 6:40 AM PGY-1, [place] Intern pager: [phone], text pages welcome

## 2018-11-02 NOTE — Progress Notes (Signed)
PT Cancellation Note  Patient Details Name: GERHART RUGGIERI MRN: [id number] DOB: 11/16/1940   Cancelled Treatment:    Reason Eval/Treat Not Completed: Patient declined, no reason specified- did not sleep last night, back is hurting. Asks to wait until tomorrow.    Auna Mikkelsen 11/02/2018, 2:52 PM

## 2018-11-02 NOTE — Progress Notes (Deleted)
Pharmacy Antibiotic Note  Jacob [name] is a 78 y.o. male admitted on 11/01/2018 with cellulitis.  Pharmacy has been consulted for Vancomycin dosing.  Vancomycin 2000 mg IV Q 48 hrs. Goal AUC 400-550. Expected AUC: 515 SCr used: 2.88  Plan: Vancomycin 2000 mg IV q48 hr Monitor renal function, clinical status and C&S.  Height: 5\' 11"  (180.3 cm) Weight: 229 lb (103.9 kg) IBW/kg (Calculated) : 75.3  Temp (24hrs), Avg:99.3 F (37.4 C), Min:98.2 F (36.8 C), Max:100.7 F (38.2 C)  Recent Labs  Lab 10/31/18 1139 10/31/18 1256 11/01/18 1548 11/02/18 0502  WBC 11.0*  --  11.3* 8.5  CREATININE 2.63*  --  2.87* 2.88*  LATICACIDVEN  --  1.4  --   --     Estimated Creatinine Clearance: 25.9 mL/min (A) (by C-G formula based on SCr of 2.88 mg/dL (H)).    Allergies  Allergen Reactions  . Ace Inhibitors Other (See Comments)    Probably nausea and vomiting per patient   . Actonel [Risedronate] Nausea And Vomiting  . Ciprocinonide [Fluocinolone] Other (See Comments)    Probably nausea and vomiting per patient  . Flunisolide Other (See Comments)    Probably nausea and vomiting per patient   . Metformin And Related Other (See Comments)    Probably nausea and vomiting per patient   . Sertraline Other (See Comments)    Probably nausea and vomiting per patient   . Sulindac Other (See Comments)    Probably nausea and vomiting per patient   . Terazosin Other (See Comments)    Probably nausea and vomiting per patient     Antimicrobials this admission: Vanc 8/18 >>   Microbiology results: 8/18 BCx: pending  Thank you for allowing pharmacy to be a part of this patient's care.  Alanda Slim, PharmD, [facility] Clinical Pharmacist Please see AMION for all Pharmacists' Contact Phone Numbers 11/02/2018, 8:05 AM

## 2018-11-02 NOTE — Progress Notes (Signed)
Patient stated that he has one at home and he has been trying to use it for the last month, but he cannot get used to using it.  The patient refused CPAP for tonight.  No distress noted, however, patient does seem rather sleepy but arousable.  Will continue to monitor.

## 2018-11-02 NOTE — Progress Notes (Signed)
Pharmacy Antibiotic Note  Jacob [name] is a 78 y.o. male admitted on 11/01/2018 with facial cellulitis.  Pharmacy has been consulted for vancomycin dosing. Pt used doxycycline 100 mg and possibly also cefuroxime 250 mg BID for ~24 hrs before presenting to ED with worsening cellulitis (erythemia spread and throat swelling).  Mildly elevated WBC and low grade fever. Creatinine elevated with known CKD Stage IV.   Loading dose of IV Vanc 1,000 mg given 8/18 at 4pm. Dose not adequate given patient's weight.   Plan: Give another loading dose of IV Vanc at 1,250 mg.  - Check random level AM. Re-dose when level < 20 mg/mL.  Height: 5\' 11"  (180.3 cm) Weight: 229 lb (103.9 kg) IBW/kg (Calculated) : 75.3  Temp (24hrs), Avg:99 F (37.2 C), Min:97.5 F (36.4 C), Max:100.7 F (38.2 C)  Recent Labs  Lab 10/31/18 1139 10/31/18 1256 11/01/18 1548 11/02/18 0502  WBC 11.0*  --  11.3* 8.5  CREATININE 2.63*  --  2.87* 2.88*  LATICACIDVEN  --  1.4  --   --     Estimated Creatinine Clearance: 25.9 mL/min (A) (by C-G formula based on SCr of 2.88 mg/dL (H)).    Allergies  Allergen Reactions  . Ace Inhibitors Other (See Comments)    Probably nausea and vomiting per patient   . Actonel [Risedronate] Nausea And Vomiting  . Ciprocinonide [Fluocinolone] Other (See Comments)    Probably nausea and vomiting per patient  . Flunisolide Other (See Comments)    Probably nausea and vomiting per patient   . Metformin And Related Other (See Comments)    Probably nausea and vomiting per patient   . Sertraline Other (See Comments)    Probably nausea and vomiting per patient   . Sulindac Other (See Comments)    Probably nausea and vomiting per patient   . Terazosin Other (See Comments)    Probably nausea and vomiting per patient     Antimicrobials this admission: Vanc 8/18 >>  Dose adjustments this admission:  Microbiology results: 8/18 BCx: NGTD 8/19 MRSA PCR: 8/18 Covid Neg  Thank you  for allowing pharmacy to be a part of this patient's care.  Julieta Bellini 11/02/2018 1:06 PM

## 2018-11-02 NOTE — Progress Notes (Addendum)
Occupational Therapy Evaluation Patient Details Name: BRACEN SCHUM MRN: [id number] DOB: October 09, 1940 Today's Date: 11/02/2018    History of Present Illness ISAC LINCKS is a 78 y.o. male presenting with left facial swelling. PMH is significant for CAD, insulin-dependent T2DM, neuropathy, RA, PMR, OSA, COPD, CKD stage IV, HFpEF, paroxysmal atrial fibrillation on amiodarone and Eliquis.   Clinical Impression   PTA, pt ambulated with a RW and wife assisted as needed with ADL due to back issues. Pt states he has required more assistance lately and has experienced 3 falls within the last month. Feel pt would benefit from [facility] maximize functional level of independence and reduce risk of falls. Will follow acutely.     Follow Up Recommendations  Home health OT;Supervision - Intermittent    Equipment Recommendations  None recommended by OT    Recommendations for Other Services PT consult     Precautions / Restrictions Precautions Precautions: Fall      Mobility Bed Mobility Overal bed mobility: Modified Independent             General bed mobility comments: HOB elevated; got out of the bed on his own earlier today without nursing Discussed need to call for help due to fall risk. Pt verbalized understanding.  Transfers Overall transfer level: Needs assistance Equipment used: Rolling walker (2 wheeled) Transfers: Sit to/from Stand Sit to Stand: Min guard              Balance Overall balance assessment: History of Falls                                         ADL either performed or assessed with clinical judgement   ADL Overall ADL's : Needs assistance/impaired     Grooming: Min guard;Standing   Upper Body Bathing: Set up;Sitting   Lower Body Bathing: Minimal assistance;Sit to/from stand   Upper Body Dressing : Set up;Sitting   Lower Body Dressing: Moderate assistance;Sit to/from stand   Toilet Transfer: RW;Ambulation;Comfort height  toilet;Grab bars;Min guard   Toileting- Clothing Manipulation and Hygiene: Supervision/safety;Sit to/from stand       Functional mobility during ADLs: Rolling walker;Cueing for safety;Min guard  States getting in /out of tub is very difficult. Pt has tub bench      Vision Baseline Vision/History: Wears glasses Wears Glasses: At all times Additional Comments: does not have his glasses at the hospital     Perception     Praxis      Pertinent Vitals/Pain Pain Assessment: Faces Faces Pain Scale: Hurts little more Pain Location: back; face Pain Descriptors / Indicators: Aching;Discomfort Pain Intervention(s): Limited activity within patient's tolerance     Hand Dominance Right   Extremity/Trunk Assessment Upper Extremity Assessment Upper Extremity Assessment: Generalized weakness   Lower Extremity Assessment Lower Extremity Assessment: Defer to PT evaluation   Cervical / Trunk Assessment Cervical / Trunk Assessment: Kyphotic;Other exceptions(hx of back pain; recent injection; hx of surgery)   Communication Communication Communication: HOH   Cognition Arousal/Alertness: Awake/alert Behavior During Therapy: WFL for tasks assessed/performed Overall Cognitive Status: History of cognitive impairments - at baseline(daughter reports pt becomes delirius with pain medicine)                                 General Comments: brother assists with medication management; decreased safety awareness  General Comments  States he has had 3 falls in past month. Able to scoot to the furniture adn pull himself off of the floor.  Pt states he is very frustrated because his "body is wearing out"    Exercises     Shoulder Instructions      Home Living Family/patient expects to be discharged to:: Private residence Living Arrangements: Spouse/significant other Available Help at Discharge: Available 24 hours/day Type of Home: Apartment Home Access: Level entry     Home  Layout: One level     Bathroom Shower/Tub: Teacher, early years/pre: Standard Bathroom Accessibility: Yes How Accessible: Accessible via walker Home Equipment: [place] - 2 wheels;Walker - 4 wheels;Cane - single point;Bedside commode;Tub bench;Wheelchair - manual;Hospital bed          Prior Functioning/Environment Level of Independence: Needs assistance    ADL's / Homemaking Assistance Needed: wife assists with LB ADL as needed Communication / Swallowing Assistance Needed: HOH          OT Problem List: Decreased range of motion;Decreased activity tolerance;Impaired balance (sitting and/or standing);Decreased strength;Decreased safety awareness;Decreased knowledge of use of DME or AE;Cardiopulmonary status limiting activity;Obesity;Pain      OT Treatment/Interventions: Self-care/ADL training;Therapeutic exercise;Energy conservation;DME and/or AE instruction;Therapeutic activities;Cognitive remediation/compensation;Patient/family education;Balance training    OT Goals(Current goals can be found in the care plan section) Acute Rehab OT Goals Patient Stated Goal: to go home OT Goal Formulation: With patient Time For Goal Achievement: 11/16/18 Potential to Achieve Goals: Good  OT Frequency: Min 2X/week   Barriers to D/C:            Co-evaluation              AM-PAC OT "6 Clicks" Daily Activity     Outcome Measure Help from another person eating meals?: None Help from another person taking care of personal grooming?: A Little Help from another person toileting, which includes using toliet, bedpan, or urinal?: A Little Help from another person bathing (including washing, rinsing, drying)?: A Little Help from another person to put on and taking off regular upper body clothing?: A Little Help from another person to put on and taking off regular lower body clothing?: A Lot 6 Click Score: 18   End of Session Equipment Utilized During Treatment: Gait belt;Rolling  walker Nurse Communication: Mobility status  Activity Tolerance: Patient tolerated treatment well Patient left: in chair;with call bell/phone within reach;with chair alarm set  OT Visit Diagnosis: Unsteadiness on feet (R26.81);History of falling (Z91.81);Muscle weakness (generalized) (M62.81);Pain Pain - part of body: (back adn face)                Time: 1210-1240 OT Time Calculation (min): 30 min Charges:  OT General Charges $OT Visit: 1 Visit OT Evaluation $OT Eval Moderate Complexity: 1 Mod OT Treatments $Self Care/Home Management : 8-22 mins  Maurie Boettcher, OT/L   Acute OT Clinical Specialist [place] Pager [phone] Office [phone]   [facility] 11/02/2018, 1:33 PM

## 2018-11-02 NOTE — Progress Notes (Signed)
Patient daughter helped pt back to bathroom and bed.   Patient did not urinate in urinal again.   Provided education for daughter- need to collect urine for measurement.

## 2018-11-02 NOTE — TOC Initial Note (Addendum)
Transition of Care [facility]) - Initial/Assessment Note    Patient Details  Name: Jacob [name] MRN: [id number] Date of Birth: December 06, 1940  Transition of Care [facility]) CM/SW Contact:    Carles Collet, RN Phone Number: 11/02/2018, 3:04 PM  Clinical Narrative:          Damaris Schooner w patient at bedside. He is from home w his wife. OT recs for [facility], PT pending. Discussed [place] care, discussed medicare list. He stated he used [facility] in past and would like to use them again. Referral made to [place], waiting to hear back.   Patient goes to [facility], PCP is Elberta Spaniel, CSW is Abundio Miu [phone] ext [phone]  Spoke w April Alexander and notified of admission and paged and LM with  Marquita to notify of DC to home w [facility] services through [facility]   Please call Marquita CSW at [facility] to notify her of [facility] services ordered.  Please fax orders, H&P, PT OT notes to Dr Elberta Spaniel at [phone]   Update 11/03/18 08:15 [place] accepted for [facility] services          Expected Discharge Plan: [place] Barriers to Discharge: Continued Medical Work up   Patient Goals and CMS Choice Patient states their goals for this hospitalization and ongoing recovery are:: to return home CMS Medicare.gov Compare Post Acute Care list provided to:: Patient Choice offered to / list presented to : Patient  Expected Discharge Plan and Services Expected Discharge Plan: [place] Choice: [place] arrangements for the past 2 months: Single Family Home                           HH Arranged: PT, OT [place] Agency: [facility] Date [place]: 11/02/18 Time [place]: 1504 Representative spoke with at [place]: [place] Arrangements/Services Living arrangements for the past 2 months: [place] with:: Spouse                   Activities of Daily Living Home Assistive Devices/Equipment: Wheelchair ADL  Screening (condition at time of admission) Patient's cognitive ability adequate to safely complete daily activities?: No Is the patient deaf or have difficulty hearing?: No Does the patient have difficulty seeing, even when wearing glasses/contacts?: No Does the patient have difficulty concentrating, remembering, or making decisions?: No Patient able to express need for assistance with ADLs?: Yes Does the patient have difficulty dressing or bathing?: Yes Independently performs ADLs?: No Communication: Independent Dressing (OT): Needs assistance Is this a change from baseline?: Pre-admission baseline Grooming: Independent Feeding: Independent Bathing: Needs assistance Is this a change from baseline?: Pre-admission baseline Toileting: Needs assistance Is this a change from baseline?: Pre-admission baseline In/Out Bed: Dependent Is this a change from baseline?: Pre-admission baseline Walks in Home: Dependent Is this a change from baseline?: Pre-admission baseline Does the patient have difficulty walking or climbing stairs?: Yes Weakness of Legs: None Weakness of Arms/Hands: None  Permission Sought/Granted                  Emotional Assessment              Admission diagnosis:  History of CHF (congestive heart failure) [Z86.79] Facial cellulitis [L03.211] Patient Active Problem List   Diagnosis Date Noted  . Gait abnormality 11/02/2018  . Cellulitis 11/01/2018  . Paroxysmal  atrial fibrillation ([place]) 08/22/2018  . High risk medication use 08/22/2018  . Chronic anticoagulation 08/22/2018  . On amiodarone therapy 08/22/2018  . Hypertensive heart disease 04/24/2018  . CKD (chronic kidney disease) 11/27/2015  . Chronic diastolic heart failure ([place]) 11/27/2015  . Normocytic anemia 11/27/2015  . AKI (acute kidney injury) ([place])   . Acute kidney injury superimposed on chronic kidney disease ([place]) 10/02/2015  . Hypotension 10/02/2015  . Anorexia   . Adjustment disorder  with mixed emotional features 09/01/2015  . Acute encephalopathy 08/29/2015  . Elevated troponin 08/29/2015  . Renal cancer ([place]) 08/28/2015  . Spinal stenosis of lumbar region with radiculopathy 08/07/2015  . COPD (chronic obstructive pulmonary disease) ([place])   . OSA (obstructive sleep apnea) 07/27/2015  . HNP (herniated nucleus pulposus), lumbar 07/24/2015  . PMR (polymyalgia rheumatica) ([place]) 09/09/2013  . Rheumatoid arthritis ([place]) 04/13/2012  . CAD (coronary artery disease) 12/19/2011  . Insulin dependent diabetes mellitus ([place]) 12/19/2011  . Neuropathy ([place]) 12/19/2011   PCP:  Clinic, [place]:   [id number] [place], [place] - [address] [place] [address] Phone: [phone] Fax: [phone]     Social Determinants of Health (SDOH) Interventions    Readmission Risk Interventions No flowsheet data found.

## 2018-11-03 DIAGNOSIS — L03211 Cellulitis of face: Secondary | ICD-10-CM

## 2018-11-03 DIAGNOSIS — I509 Heart failure, unspecified: Secondary | ICD-10-CM

## 2018-11-03 LAB — BASIC METABOLIC PANEL
Anion gap: 11 (ref 5–15)
BUN: 36 mg/dL — ABNORMAL HIGH (ref 8–23)
CO2: 26 mmol/L (ref 22–32)
Calcium: 8 mg/dL — ABNORMAL LOW (ref 8.9–10.3)
Chloride: 105 mmol/L (ref 98–111)
Creatinine, Ser: 2.97 mg/dL — ABNORMAL HIGH (ref 0.61–1.24)
GFR calc Af Amer: 22 mL/min — ABNORMAL LOW (ref >60)
GFR calc non Af Amer: 19 mL/min — ABNORMAL LOW (ref >60)
Glucose, Bld: 151 mg/dL — ABNORMAL HIGH (ref 70–99)
Potassium: 3.9 mmol/L (ref 3.5–5.1)
Sodium: 142 mmol/L (ref 135–145)

## 2018-11-03 LAB — CBC
HCT: 30.5 % — ABNORMAL LOW (ref 39.0–52.0)
Hemoglobin: 9.7 g/dL — ABNORMAL LOW (ref 13.0–17.0)
MCH: 31.5 pg (ref 26.0–34.0)
MCHC: 31.8 g/dL (ref 30.0–36.0)
MCV: 99 fL (ref 80.0–100.0)
Platelets: 164 10*3/uL (ref 150–400)
RBC: 3.08 MIL/uL — ABNORMAL LOW (ref 4.22–5.81)
RDW: 14.6 % (ref 11.5–15.5)
WBC: 6.2 10*3/uL (ref 4.0–10.5)
nRBC: 0 % (ref 0.0–0.2)

## 2018-11-03 LAB — GLUCOSE, CAPILLARY
Glucose-Capillary: 140 mg/dL — ABNORMAL HIGH (ref 70–99)
Glucose-Capillary: 212 mg/dL — ABNORMAL HIGH (ref 70–99)
Glucose-Capillary: 283 mg/dL — ABNORMAL HIGH (ref 70–99)
Glucose-Capillary: 218 mg/dL — ABNORMAL HIGH (ref 70–99)

## 2018-11-03 LAB — VANCOMYCIN, RANDOM: Vancomycin Rm: 16

## 2018-11-03 MED ORDER — CLINDAMYCIN HCL 300 MG PO CAPS
450.0000 mg | ORAL_CAPSULE | Freq: Three times a day (TID) | ORAL | Status: DC
  Administered 2018-11-04 (×2): 450 mg via ORAL
  Filled 2018-11-03 (×4): qty 1

## 2018-11-03 MED ORDER — VANCOMYCIN HCL IN DEXTROSE 1-5 GM/200ML-% IV SOLN
1000.0000 mg | Freq: Once | INTRAVENOUS | Status: AC
  Administered 2018-11-03: 1000 mg via INTRAVENOUS
  Filled 2018-11-03 (×2): qty 200

## 2018-11-03 MED ORDER — INSULIN ASPART 100 UNIT/ML ~~LOC~~ SOLN
0.0000 [IU] | Freq: Three times a day (TID) | SUBCUTANEOUS | Status: DC
  Administered 2018-11-03: 1 [IU] via SUBCUTANEOUS
  Administered 2018-11-03: 3 [IU] via SUBCUTANEOUS
  Administered 2018-11-03: 5 [IU] via SUBCUTANEOUS
  Administered 2018-11-04: 3 [IU] via SUBCUTANEOUS

## 2018-11-03 MED ORDER — MUPIROCIN CALCIUM 2 % EX CREA
TOPICAL_CREAM | Freq: Two times a day (BID) | CUTANEOUS | Status: DC
  Administered 2018-11-03 – 2018-11-04 (×3): via TOPICAL
  Filled 2018-11-03: qty 15

## 2018-11-03 NOTE — Evaluation (Addendum)
Physical Therapy Evaluation Patient Details Name: Jacob [name] MRN: [id number] DOB: 12/23/1940 Today's Date: 11/03/2018   History of Present Illness  Jacob [name] is a 78 y.o. male presenting with left facial swelling. PMH is significant for CAD, insulin-dependent T2DM, neuropathy, RA, PMR, OSA, COPD, CKD stage IV, HFpEF, paroxysmal atrial fibrillation on amiodarone and Eliquis.  Clinical Impression  Patient received in bed, reports he had a better night last night. Also reports he sat up in the recliner to eat breakfast. Agrees to PT evaluation. Patient performed bed mobility with mod independence ( use of rails, increased time and effort to complete due to back pain). Transfers with min guard. Ambulated 80 feet with RW and min guard assist. Patient limited by fatigue and back pain. Required one brief standing rest break. O2 sats >90% throughout session. Patient will benefit from continued skilled PT to address his weakness, decreased activity tolerance and difficulty with mobility.       Follow Up Recommendations Home health PT    Equipment Recommendations  None recommended by PT    Recommendations for Other Services       Precautions / Restrictions Precautions Precautions: Fall Restrictions Weight Bearing Restrictions: No      Mobility  Bed Mobility Overal bed mobility: Modified Independent             General bed mobility comments: HOB elevated, use of rails  Transfers Overall transfer level: Needs assistance Equipment used: Rolling walker (2 wheeled) Transfers: Sit to/from Stand Sit to Stand: Min guard            Ambulation/Gait Ambulation/Gait assistance: Min guard Gait Distance (Feet): 80 Feet Assistive device: Rolling walker (2 wheeled) Gait Pattern/deviations: Decreased step length - right;Decreased step length - left;Step-through pattern;Decreased stride length Gait velocity: decreased   General Gait Details: back pain increased with  walking  Stairs            Wheelchair Mobility    Modified Rankin (Stroke Patients Only)       Balance Overall balance assessment: History of Falls                                           Pertinent Vitals/Pain Pain Assessment: 0-10 Pain Score: 8  Pain Location: back, chronic Pain Descriptors / Indicators: Aching;Discomfort Pain Intervention(s): Monitored during session;Limited activity within patient's tolerance    Home Living Family/patient expects to be discharged to:: Private residence Living Arrangements: Spouse/significant other Available Help at Discharge: Family;Available 24 hours/day Type of Home: Apartment Home Access: Level entry     Home Layout: One level Home Equipment: Walker - 2 wheels;Walker - 4 wheels;Cane - single point;Bedside commode;Tub bench;Wheelchair - manual;Hospital bed      Prior Function Level of Independence: Needs assistance   Gait / Transfers Assistance Needed: limited ambulation at baseline due to chronic back pain  ADL's / Homemaking Assistance Needed: wife assists with LB ADL as needed        Hand Dominance   Dominant Hand: Right    Extremity/Trunk Assessment   Upper Extremity Assessment Upper Extremity Assessment: Defer to OT evaluation    Lower Extremity Assessment Lower Extremity Assessment: Generalized weakness       Communication   Communication: HOH  Cognition Arousal/Alertness: Awake/alert Behavior During Therapy: WFL for tasks assessed/performed Overall Cognitive Status: No family/caregiver present to determine baseline cognitive functioning  General Comments: brother assists with medication management; decreased safety awareness      General Comments      Exercises     Assessment/Plan    PT Assessment Patient needs continued PT services  PT Problem List Decreased strength;Decreased mobility;Pain;Decreased activity  tolerance;Decreased cognition;Decreased safety awareness       PT Treatment Interventions Therapeutic activities;Gait training;Therapeutic exercise;Patient/family education;Functional mobility training    PT Goals (Current goals can be found in the Care Plan section)  Acute Rehab PT Goals Patient Stated Goal: to return home PT Goal Formulation: With patient Time For Goal Achievement: 11/10/18 Potential to Achieve Goals: Good    Frequency Min 2X/week   Barriers to discharge        Co-evaluation               AM-PAC PT "6 Clicks" Mobility  Outcome Measure Help needed turning from your back to your side while in a flat bed without using bedrails?: A Little Help needed moving from lying on your back to sitting on the side of a flat bed without using bedrails?: A Little Help needed moving to and from a bed to a chair (including a wheelchair)?: A Little Help needed standing up from a chair using your arms (e.g., wheelchair or bedside chair)?: A Little Help needed to walk in hospital room?: A Little Help needed climbing 3-5 steps with a railing? : A Lot 6 Click Score: 17    End of Session Equipment Utilized During Treatment: Gait belt Activity Tolerance: Patient tolerated treatment well;Patient limited by pain Patient left: in bed;with bed alarm set;with call bell/phone within reach Nurse Communication: Mobility status PT Visit Diagnosis: Muscle weakness (generalized) (M62.81);Other abnormalities of gait and mobility (R26.89);Pain Pain - part of body: (back)    Time: 4469-5072 PT Time Calculation (min) (ACUTE ONLY): 19 min   Charges:   PT Evaluation $PT Eval Low Complexity: 1 Low PT Treatments $Gait Training: 8-22 mins        Jacob [name], PT, GCS 11/03/18,11:32 AM

## 2018-11-03 NOTE — Progress Notes (Signed)
Pharmacy Antibiotic Note  Jacob [name] is a 78 y.o. male admitted on 11/01/2018 with facial cellulitis.  Pharmacy has been consulted for vancomycin dosing. Pt used doxycycline 100 mg and possibly also cefuroxime 250 mg BID for ~24 hrs before presenting to ED with worsening cellulitis (erythemia spread and throat swelling). Pt also reported using doxy 2-3 times in the past for upper respiratory infections. WBC normal at 6.2 and Temp 98.4 F. Creatinine elevated with known CKD Stage IV.   Loading dose of IV Vanc 1,000 mg given 8/18 at 4pm. Another loading dose of IV Vanc 1,250 mg given 8/19 at 8:30 am to complete loading dose based on weight.  Random level checked AM at 16.   Plan: Give another loading dose of IV Vanc at 1,000 mg based on estimated weight of 100 kg due to random level of 16. - Switch to PO clindamycin 450 mg TID for 3-5 days.    Height: 5\' 11"  (180.3 cm) Weight: 163 lb (73.9 kg) IBW/kg (Calculated) : 75.3  Temp (24hrs), Avg:98.3 F (36.8 C), Min:97.5 F (36.4 C), Max:99.2 F (37.3 C)  Recent Labs  Lab 10/31/18 1139 10/31/18 1256 11/01/18 1548 11/02/18 0502 11/03/18 0600  WBC 11.0*  --  11.3* 8.5 6.2  CREATININE 2.63*  --  2.87* 2.88* 2.97*  LATICACIDVEN  --  1.4  --   --   --   VANCORANDOM  --   --   --   --  16    Estimated Creatinine Clearance: 21.4 mL/min (A) (by C-G formula based on SCr of 2.97 mg/dL (H)).    Allergies  Allergen Reactions  . Ace Inhibitors Other (See Comments)    Probably nausea and vomiting per patient   . Actonel [Risedronate] Nausea And Vomiting  . Ciprocinonide [Fluocinolone] Other (See Comments)    Probably nausea and vomiting per patient  . Flunisolide Other (See Comments)    Probably nausea and vomiting per patient   . Metformin And Related Other (See Comments)    Probably nausea and vomiting per patient   . Sertraline Other (See Comments)    Probably nausea and vomiting per patient   . Sulindac Other (See Comments)   Probably nausea and vomiting per patient   . Terazosin Other (See Comments)    Probably nausea and vomiting per patient     Antimicrobials this admission: Vanc 8/18 >>   Dose adjustments this admission:  Microbiology results: 8/18 BCx: NGTD  8/19 MRSA PCR Positive 8/18 Covid Neg  Thank you for allowing pharmacy to be a part of this patient's care.  Julieta Bellini 11/03/2018 10:51 AM

## 2018-11-03 NOTE — Progress Notes (Addendum)
Patient  reweighed-  102.694 kg Standing weight.    Ht: 5 ft 11in

## 2018-11-03 NOTE — Progress Notes (Signed)
Spoke to IP- will leave Contact precautions on until patient is decolonized. Once patient has been started on medication to can remove Precautions

## 2018-11-03 NOTE — Progress Notes (Signed)
Pt refuses to wear CPAP for tonight.

## 2018-11-03 NOTE — Progress Notes (Addendum)
Family Medicine Teaching Service Daily Progress Note Intern Pager: [phone]  Patient name: Jacob [name] Medical record number: [id number] Date of birth: 1940-08-04 Age: 78 y.o. Gender: male  Primary Care Provider: [facility] Consultants: None Code Status: Full  Pt Overview and Major Events to Date:  8/18-patient admitted to [facility] for facial cellulitis  Assessment and Plan: Jacob [name] is a 78 y.o. male presenting with left facial swelling. PMH is significant for CAD, insulin-dependent T2DM, neuropathy, RA, PMR, OSA, COPD, CKD stage IV, HFpEF, paroxysmal atrial fibrillation on amiodarone and Eliquis.  Left facial cellulitis Patient had a boil on the left side of his face on Friday.  He went to the [place] 8/17 and they told him he needed to come to the [facility] emergency room.  CT 8/17 showed no signs of abscess or oral involvement.  He was started on doxycycline given for precautions, and discharged.  Patient returned 8/18 due to increased swelling including spread to his neck.  He was started on IV Vanco in the ED 8/18.    Extraocular movements and intact.  Blood cultures collected although they will most likely be negative due to patient's antibiotic status.   - Plan to transition to p.o. clindamycin 8/21 - Follow blood cultures no growth after <12 hours  - Monitor for signs of orbital involvement or airway compromise -Consider ENT if patient worsens  Acute on chronic HFpEF BNP on admission 6035.  Chest x-ray showed signs of overload.  Crackles and left side on admission exam.  Class III bilateral lower extremity pitting edema.  Patient has been unable to lie flat for "years".  Patient reports good compliance with home furosemide.Home meds include metoprolol 100 mg twice daily, amlodipine 10 mg daily, atorvastatin 20 mg COVID test was negative.  Patient urine output reportedly 500 over the last 24 hours but there is nursing notes that patient is urinating in toilet rather than  urine also it is unclear how much urine output he has had. -Was diuresed yesterday and appears dry today we will hold Lasix at this time -Continue home meds -Cardiology consult if patient worsens - I's/O's  Paroxysmal atrial fibrillation on amiodarone and Eliquis On 8/17 EKG showed right bundle block, first-degree AV block which was previously seen.  Patient takes amiodarone 100 mg daily and Eliquis 5 mg twice daily. -Continue home meds -EKG will be repeated this morning -Telemetry  CKD stage IV Patient home meds include Calcitrol.  Creatinine on admission was 2.85.  Patient follows with [facility] so unable to see what baseline creatinine is. - Diuresis stable and continue to monitor renal function -Morning BMPs -I's/O's -Replace electrolytes as needed - Avoid nephrotoxic agents  Insulin-dependent type 2 diabetes mellitus with neuropathy Patient uses a 70/30 insulin at home which he scales based on CBGs last A1c reported by patient was 6.2. -SSI, CBG units with meals and nightly -Continue home Lyrica - Restart home long-acting when he has more p.o. intake  Rheumatoid arthritis Home meds include daily 5 mg prednisone -Continue home meds  COPD/OSA/sarcoidosis Patient home meds include Symbicort daily and albuterol as needed.  Also takes montelukast.  Patient may be unable to use CPAP for OSA due to facial cellulitis. -Continue home medications - CPAP nightly if patient can tolerate  BPH -Continue home Flomax  FEN/GI: Carb modified/heart healthy Prophylaxis: Eliquis *  Disposition: Home tomorrow morning  Subjective:  Doing well this morning.  Reports that he slept better than he did night before last.  Denies pain in his face.  Also denies shortness of breath or chest pain.  Objective: Temp:  [97.5 F (36.4 C)-99.2 F (37.3 C)] 98 F (36.7 C) (08/20 0624) Pulse Rate:  [64-83] 64 (08/20 0624) Resp:  [15-20] 20 (08/20 0624) BP: (131-158)/(63-76) 131/63 (08/20  0624) SpO2:  [90 %-99 %] 98 % (08/20 0624) Weight:  [103.9 kg] 103.9 kg (08/19 6010) General: Alert and cooperative and appears to be in no acute distress resting comfortably on left side in bed.  Has heating pack under his back HEENT: Neck non-tender without lymphadenopathy, masses or thyromegaly patient has cellulitis on the left face.  Edema appears decreased from yesterday and erythema is unchanged from yesterday. Cardio: Normal S1 and S2, no S3 or S4. Rhythm is regular. No murmurs or rubs.   Pulm: Normal respiratory effort.  Diffuse expiratory wheezes bilaterally Abdomen: Bowel sounds normal. Abdomen soft and non-tender.  Extremities: 2+ edema in lower extremities bilaterally Neuro: Cranial nerves grossly intact   Laboratory: Recent Labs  Lab 10/31/18 1139 11/01/18 1548 11/02/18 0502  WBC 11.0* 11.3* 8.5  HGB 10.6* 11.7* 10.0*  HCT 34.5* 37.2* 31.5*  PLT 163 174 146*   Recent Labs  Lab 10/31/18 1139 11/01/18 1548 11/02/18 0502  NA 137 140 141  K 4.3 4.3 3.9  CL 101 103 107  CO2 25 25 24   BUN 32* 36* 33*  CREATININE 2.63* 2.87* 2.88*  CALCIUM 8.2* 8.3* 7.8*  GLUCOSE 197* 123* 102*     Imaging/Diagnostic Tests: Ct Soft Tissue Neck Wo Contrast  Result Date: 11/01/2018 CLINICAL DATA:  Sore throat/stridor. Epiglottitis or tonsillitis suspected. Left neck and face swelling. EXAM: CT NECK WITHOUT CONTRAST TECHNIQUE: Multidetector CT imaging of the neck was performed following the standard protocol without intravenous contrast. COMPARISON:  CT face 10/31/2018 FINDINGS: Pharynx and larynx: Normal. No mass or swelling. Salivary glands: No inflammation, mass, or stone. Thyroid: Negative Lymph nodes: No enlarged or pathologic lymph nodes in the neck. Vascular: Limited vascular evaluation without intravenous contrast. Atherosclerotic calcification in the carotid bifurcation bilaterally. Limited intracranial: No acute abnormality. Visualized orbits: Bilateral cataract surgery. No  mass or edema in the orbit Mastoids and visualized paranasal sinuses: Extensive mucosal edema in the maxillary sinus bilaterally. Mild mucosal edema in the sphenoid and ethmoid sinuses bilaterally. Mastoid sinus is clear bilaterally. Skeleton: Cervical spondylosis.  No acute skeletal abnormality. Upper chest: Lung apices clear bilaterally. Other: Soft tissue swelling of the left face and neck. There is skin thickening with infiltration of subcutaneous tissue. There is also thickening of the platysmas muscle and edema deep to the platysmas muscle which shows mild progression. No abscess. Unenhanced CT is less accurate in detecting abscess compared to contrast-enhanced CT. IMPRESSION: Diffuse edema in the left neck with skin thickening and subcutaneous edema. Mild progression of thickening of the platysmas muscle and edema deep to the platysmas muscle. Findings are most likely due to cellulitis. No definite abscess given unenhanced imaging. Electronically Signed   By: Franchot Gallo M.D.   On: 11/01/2018 20:36   Dg Chest Port 1 View  Result Date: 11/01/2018 CLINICAL DATA:  Shortness of breath EXAM: PORTABLE CHEST 1 VIEW COMPARISON:  10/31/2018 FINDINGS: Prior CABG. Cardiomegaly. Calcified granuloma at the right lung base. Small left pleural effusion with left base atelectasis again noted, unchanged. No acute bony abnormality. IMPRESSION: Cardiomegaly. Small left pleural effusion with left base atelectasis. Electronically Signed   By: Rolm Baptise M.D.   On: 11/01/2018 16:14   Dg Chest Port 1  View  Result Date: 10/31/2018 CLINICAL DATA:  Chest pain and shortness of breath. EXAM: PORTABLE CHEST 1 VIEW COMPARISON:  12/01/2015. FINDINGS: Stable enlarged cardiac silhouette and post CABG changes. Mildly prominent pulmonary vasculature. Minimally prominent interstitial markings with improvement. Stable small calcified granuloma at the right lung base. Interval small left pleural effusion and mild left basilar  airspace opacity. Diffuse osteopenia. IMPRESSION: 1. Interval small left pleural effusion and mild left basilar atelectasis or pneumonia. 2. Stable cardiomegaly and minimal chronic interstitial lung disease with resolved changes of congestive heart failure. Electronically Signed   By: Claudie Revering M.D.   On: 10/31/2018 13:06   Ct Maxillofacial Wo Cm  Result Date: 10/31/2018 CLINICAL DATA:  Recurring LEFT-sided facial swelling. EXAM: CT MAXILLOFACIAL WITHOUT CONTRAST TECHNIQUE: Multidetector CT imaging of the maxillofacial structures was performed. Multiplanar CT image reconstructions were also generated. COMPARISON:  None. FINDINGS: Osseous: Facial bones are intact, normally aligned and normal in mineralization throughout. No teeth. No evidence of residual dental abscess. Orbits: Periorbital and retro-orbital soft tissues are unremarkable. Sinuses: Chronic mucosal thickening and/or mucous retention cysts filling the bilateral maxillary sinuses. Additional mild mucosal thickening within the LEFT frontal sinus and sphenoid sinus. No fluid levels. Both ostiomeatal units are obstructed by the mucosal thickening, complete on the RIGHT and partial on the LEFT. Soft tissues: Diffuse ill-defined edema/inflammation of the subcutaneous soft tissues of the LEFT face, with overlying skin thickening. No circumscribed fluid collection or abscess like collection identified within the soft tissues. Deeper parapharyngeal soft tissues are unremarkable. Soft tissues of the RIGHT face are unremarkable. Limited intracranial: No significant or unexpected finding. IMPRESSION: 1. Diffuse ill-defined edema/inflammation of the subcutaneous soft tissues of the LEFT face, with overlying skin thickening, most compatible with cellulitis. No circumscribed fluid collection or abscess like collection is identified within these soft tissues. 2. Chronic appearing paranasal sinus disease, as detailed above. 3. No acute appearing osseous  abnormality. No evidence of osteomyelitis. Electronically Signed   By: Franki Cabot M.D.   On: 10/31/2018 13:54    Gifford Shave, MD 11/03/2018, 6:27 AM PGY-1, [place] Intern pager: [phone], text pages welcome

## 2018-11-03 NOTE — Progress Notes (Signed)
MRSA result back- patient is positive Called MD to let them know.

## 2018-11-03 NOTE — Plan of Care (Signed)
  Problem: Education: Goal: Knowledge of General Education information will improve Description Including pain rating scale, medication(s)/side effects and non-pharmacologic comfort measures Outcome: Progressing   

## 2018-11-04 DIAGNOSIS — Z8679 Personal history of other diseases of the circulatory system: Secondary | ICD-10-CM

## 2018-11-04 DIAGNOSIS — R269 Unspecified abnormalities of gait and mobility: Secondary | ICD-10-CM

## 2018-11-04 LAB — GLUCOSE, CAPILLARY
Glucose-Capillary: 218 mg/dL — ABNORMAL HIGH (ref 70–99)
Glucose-Capillary: 267 mg/dL — ABNORMAL HIGH (ref 70–99)

## 2018-11-04 MED ORDER — INSULIN ASPART 100 UNIT/ML ~~LOC~~ SOLN
0.0000 [IU] | Freq: Three times a day (TID) | SUBCUTANEOUS | Status: DC
  Administered 2018-11-04: 8 [IU] via SUBCUTANEOUS

## 2018-11-04 MED ORDER — CLINDAMYCIN HCL 150 MG PO CAPS: 450.0000 mg | ORAL_CAPSULE | Freq: Three times a day (TID) | ORAL | 0 refills | Status: AC

## 2018-11-04 MED ORDER — FUROSEMIDE 40 MG PO TABS
40.0000 mg | ORAL_TABLET | Freq: Every day | ORAL | Status: DC
  Administered 2018-11-04: 40 mg via ORAL
  Filled 2018-11-04: qty 1

## 2018-11-04 MED FILL — CLINDAMYCIN HCL 150 MG CAPS: 150 | 7 days supply | Qty: 63 | Fill #0

## 2018-11-04 NOTE — Discharge Summary (Signed)
[facility] Discharge Summary  Patient name: Jacob [name] Medical record number: [id number] Date of birth: 08/30/1940 Age: 78 y.o. Gender: male Date of Admission: 11/01/2018  Date of Discharge: 11/04/2018 Admitting Physician: Dickie La, MD  Primary Care Provider: [facility], [facility] Consultants: None  Indication for Hospitalization: Left facial cellulitis  Discharge Diagnoses/Problem List:  Left facial cellulitis Acute on chronic HFpEF Paroxysmal atrial fibrillation on amiodarone and Eliquis CKD stage IV Insulin-dependent type 2 diabetes mellitus with neuropathy Rheumatoid arthritis COPD/OSA/sarcoidosis BPH  Disposition: Home  Discharge Condition: Stable  Discharge Exam:  Temp:  [97.5 F (36.4 C)-99.2 F (37.3 C)] 98 F (36.7 C) (08/20 0624) Pulse Rate:  [64-83] 64 (08/20 0624) Resp:  [15-20] 20 (08/20 0624) BP: (131-158)/(63-76) 131/63 (08/20 0624) SpO2:  [90 %-99 %] 98 % (08/20 0624) Weight:  [103.9 kg] 103.9 kg (08/19 9798) General: Alert and cooperative and appears to be in no acute distress resting comfortably in bedside chair with heat pack on back HEENT: Neck non-tender without lymphadenopathy, masses or thyromegaly patient has cellulitis on the left face.    Edema stable from prior exam, facial erythema mildly decreased from yesterday. Cardio: Normal S1 and S2, no S3 or S4. Rhythm is regular. No murmurs or rubs.   Pulm: Normal respiratory effort.  Diffuse expiratory wheezes bilaterally Abdomen: Bowel sounds normal. Abdomen soft and non-tender.  Extremities: 2+ edema in lower extremities bilaterally Neuro: Cranial nerves grossly intact  Brief Hospital Course:  Patient was admitted after returning to the ED due to increased left facial swelling.  He was evaluated in the emergency room the day prior due to a left facial cellulitis.  He was started on oral doxycycline and discharged from the emergency room.  The following morning the  swelling had increased in his face and he is starting to notice some difficulty breathing.  He returned to the emergency room and was evaluated and started on IV vancomycin.  On admission patient's WBC-11.3, hemoglobin-11.7, creatinine-2.87.  During the work-up they also did a cardiac rule out due to the increased work of breathing.  Patient's BNP came back elevated at 1675.  Patient's creatinine has ranged between 1.5 and 2.5 since July 2017.  Leaving that the creatinine bump may be a result of cardiorenal effects the patient was diuresed. His urine output was not collected regularly because he kept using the restroom rather than the urinal.  His shortness of breath did decrease in the lower extremity edema that is well.  Weight at discharge was 102.7 and I believe that is close to his dry weight.  Patient's fattening slightly increased but given that the patient was on vancomycin as well as being diuresed this was not unsuspected.  While hospitalized the patient received 3 doses of vancomycin and then was transitioned to oral clindamycin.  Patient's WBCs normalized, patient remained afebrile, and his swelling and edema decreased.  Patient was lined up with follow-up with our clinic and discharged home   Issues for Follow Up:  1. Patient needs repeat BMP to check renal function, consider nephrology referral if he is not established. 2. Patient needs recheck of A1c. 3. Dry weight approximately 102.7 kg  Significant Procedures: None  Significant Labs and Imaging:  Recent Labs  Lab 11/01/18 1548 11/02/18 0502 11/03/18 0600  WBC 11.3* 8.5 6.2  HGB 11.7* 10.0* 9.7*  HCT 37.2* 31.5* 30.5*  PLT 174 146* 164   Recent Labs  Lab 10/31/18 1139 11/01/18 1548 11/02/18 0502 11/03/18 0600  NA 137 140 141 142  K 4.3 4.3 3.9 3.9  CL 101 103 107 105  CO2 25 25 24 26   GLUCOSE 197* 123* 102* 151*  BUN 32* 36* 33* 36*  CREATININE 2.63* 2.87* 2.88* 2.97*  CALCIUM 8.2* 8.3* 7.8* 8.0*  MRSA by  PCR-positive BNP-1675.9  Ct Soft Tissue Neck Wo Contrast  Result Date: 11/01/2018 CLINICAL DATA:  Sore throat/stridor. Epiglottitis or tonsillitis suspected. Left neck and face swelling. EXAM: CT NECK WITHOUT CONTRAST TECHNIQUE: Multidetector CT imaging of the neck was performed following the standard protocol without intravenous contrast. COMPARISON:  CT face 10/31/2018 FINDINGS: Pharynx and larynx: Normal. No mass or swelling. Salivary glands: No inflammation, mass, or stone. Thyroid: Negative Lymph nodes: No enlarged or pathologic lymph nodes in the neck. Vascular: Limited vascular evaluation without intravenous contrast. Atherosclerotic calcification in the carotid bifurcation bilaterally. Limited intracranial: No acute abnormality. Visualized orbits: Bilateral cataract surgery. No mass or edema in the orbit Mastoids and visualized paranasal sinuses: Extensive mucosal edema in the maxillary sinus bilaterally. Mild mucosal edema in the sphenoid and ethmoid sinuses bilaterally. Mastoid sinus is clear bilaterally. Skeleton: Cervical spondylosis.  No acute skeletal abnormality. Upper chest: Lung apices clear bilaterally. Other: Soft tissue swelling of the left face and neck. There is skin thickening with infiltration of subcutaneous tissue. There is also thickening of the platysmas muscle and edema deep to the platysmas muscle which shows mild progression. No abscess. Unenhanced CT is less accurate in detecting abscess compared to contrast-enhanced CT. IMPRESSION: Diffuse edema in the left neck with skin thickening and subcutaneous edema. Mild progression of thickening of the platysmas muscle and edema deep to the platysmas muscle. Findings are most likely due to cellulitis. No definite abscess given unenhanced imaging. Electronically Signed   By: Franchot Gallo M.D.   On: 11/01/2018 20:36   Dg Chest Port 1 View  Result Date: 11/01/2018 CLINICAL DATA:  Shortness of breath EXAM: PORTABLE CHEST 1 VIEW  COMPARISON:  10/31/2018 FINDINGS: Prior CABG. Cardiomegaly. Calcified granuloma at the right lung base. Small left pleural effusion with left base atelectasis again noted, unchanged. No acute bony abnormality. IMPRESSION: Cardiomegaly. Small left pleural effusion with left base atelectasis. Electronically Signed   By: Rolm Baptise M.D.   On: 11/01/2018 16:14   Dg Chest Port 1 View  Result Date: 10/31/2018 CLINICAL DATA:  Chest pain and shortness of breath. EXAM: PORTABLE CHEST 1 VIEW COMPARISON:  12/01/2015. FINDINGS: Stable enlarged cardiac silhouette and post CABG changes. Mildly prominent pulmonary vasculature. Minimally prominent interstitial markings with improvement. Stable small calcified granuloma at the right lung base. Interval small left pleural effusion and mild left basilar airspace opacity. Diffuse osteopenia. IMPRESSION: 1. Interval small left pleural effusion and mild left basilar atelectasis or pneumonia. 2. Stable cardiomegaly and minimal chronic interstitial lung disease with resolved changes of congestive heart failure. Electronically Signed   By: Claudie Revering M.D.   On: 10/31/2018 13:06   Ct Maxillofacial Wo Cm  Result Date: 10/31/2018 CLINICAL DATA:  Recurring LEFT-sided facial swelling. EXAM: CT MAXILLOFACIAL WITHOUT CONTRAST TECHNIQUE: Multidetector CT imaging of the maxillofacial structures was performed. Multiplanar CT image reconstructions were also generated. COMPARISON:  None. FINDINGS: Osseous: Facial bones are intact, normally aligned and normal in mineralization throughout. No teeth. No evidence of residual dental abscess. Orbits: Periorbital and retro-orbital soft tissues are unremarkable. Sinuses: Chronic mucosal thickening and/or mucous retention cysts filling the bilateral maxillary sinuses. Additional mild mucosal thickening within the LEFT frontal sinus and sphenoid sinus. No fluid  levels. Both ostiomeatal units are obstructed by the mucosal thickening, complete on the  RIGHT and partial on the LEFT. Soft tissues: Diffuse ill-defined edema/inflammation of the subcutaneous soft tissues of the LEFT face, with overlying skin thickening. No circumscribed fluid collection or abscess like collection identified within the soft tissues. Deeper parapharyngeal soft tissues are unremarkable. Soft tissues of the RIGHT face are unremarkable. Limited intracranial: No significant or unexpected finding. IMPRESSION: 1. Diffuse ill-defined edema/inflammation of the subcutaneous soft tissues of the LEFT face, with overlying skin thickening, most compatible with cellulitis. No circumscribed fluid collection or abscess like collection is identified within these soft tissues. 2. Chronic appearing paranasal sinus disease, as detailed above. 3. No acute appearing osseous abnormality. No evidence of osteomyelitis. Electronically Signed   By: Franki Cabot M.D.   On: 10/31/2018 13:54     Results/Tests Pending at Time of Discharge: None  Discharge Medications:  Allergies as of 11/04/2018      Reactions   Ace Inhibitors Other (See Comments)   Probably nausea and vomiting per patient   Actonel [risedronate] Nausea And Vomiting   Ciprocinonide [fluocinolone] Other (See Comments)   Probably nausea and vomiting per patient   Flunisolide Other (See Comments)   Probably nausea and vomiting per patient   Metformin And Related Other (See Comments)   Probably nausea and vomiting per patient   Sertraline Other (See Comments)   Probably nausea and vomiting per patient   Sulindac Other (See Comments)   Probably nausea and vomiting per patient   Terazosin Other (See Comments)   Probably nausea and vomiting per patient      Medication List    STOP taking these medications   cefUROXime 250 MG tablet Commonly known as: CEFTIN   chlorpheniramine 4 MG tablet Commonly known as: CHLOR-TRIMETON   doxycycline 100 MG capsule Commonly known as: VIBRAMYCIN   oxymetazoline 0.05 % nasal  spray Commonly known as: AFRIN     TAKE these medications   albuterol 108 (90 Base) MCG/ACT inhaler Commonly known as: VENTOLIN HFA Inhale 2 puffs into the lungs every 6 (six) hours as needed for wheezing or shortness of breath.   amiodarone 200 MG tablet Commonly known as: PACERONE Take 100 mg by mouth daily.   amLODipine 10 MG tablet Commonly known as: NORVASC Take 1 tablet (10 mg total) by mouth daily.   apixaban 5 MG Tabs tablet Commonly known as: ELIQUIS Take 5 mg by mouth 2 (two) times daily.   atorvastatin 20 MG tablet Commonly known as: LIPITOR Take 20mg  (1 tab) 2 days a week What changed:   how much to take  how to take this  when to take this  additional instructions Notes to patient: See directions   budesonide-formoterol 160-4.5 MCG/ACT inhaler Commonly known as: SYMBICORT Inhale 2 puffs into the lungs 2 (two) times daily.   calcitRIOL 0.25 MCG capsule Commonly known as: ROCALTROL Take 0.25 mcg by mouth every Monday, Wednesday, and Friday. Notes to patient: See directions   clindamycin 150 MG capsule Commonly known as: CLEOCIN Take 3 capsules (450 mg total) by mouth every 8 (eight) hours for 7 days. Notes to patient: See directions   cyanocobalamin 1000 MCG tablet Take 1,000 mcg by mouth daily.   denosumab 60 MG/ML Sosy injection Commonly known as: PROLIA Inject 60 mg into the skin every 6 (six) months. Notes to patient: See directions   ferrous sulfate 325 (65 FE) MG tablet Take 325 mg by mouth as directed. MWF Notes to  patient: See directions   finasteride 5 MG tablet Commonly known as: PROSCAR Take 5 mg by mouth at bedtime.   furosemide 40 MG tablet Commonly known as: LASIX Take 40 mg by mouth daily.   hydrALAZINE 25 MG tablet Commonly known as: APRESOLINE Take 25 mg by mouth 2 (two) times daily.   insulin aspart protamine- aspart (70-30) 100 UNIT/ML injection Commonly known as: NOVOLOG MIX 70/30 Inject 15-35 Units into the  skin See admin instructions. Take 15 units if sugar is 100 or take 35 units if sugar is above 270 Notes to patient: See directions   latanoprost 0.005 % ophthalmic solution Commonly known as: XALATAN Place 1 drop into both eyes at bedtime.   loratadine 10 MG tablet Commonly known as: CLARITIN Take 10 mg by mouth daily.   metoprolol tartrate 100 MG tablet Commonly known as: LOPRESSOR Take 100 mg by mouth 2 (two) times daily.   montelukast 10 MG tablet Commonly known as: SINGULAIR Take 1 tablet (10 mg total) by mouth at bedtime.   mupirocin ointment 2 % Commonly known as: Bactroban Place 1 application into the nose 2 (two) times daily.   nitroGLYCERIN 0.4 MG SL tablet Commonly known as: NITROSTAT Place 0.4 mg under the tongue every 5 (five) minutes as needed for chest pain.   pilocarpine 4 % ophthalmic solution Commonly known as: PILOCAR Place 1 drop into both eyes 3 (three) times daily.   predniSONE 5 MG tablet Commonly known as: DELTASONE Take 5 mg by mouth daily with breakfast.   pregabalin 75 MG capsule Commonly known as: LYRICA Take 75 mg by mouth 2 (two) times daily.   PRESERVISION AREDS 2 PO Take 2 tablets by mouth daily.   SEMAGLUTIDE(0.25 OR 0.5MG /DOS) [place] Inject 1.5 mLs into the skin once a week. 0.5mg /0.328ml Notes to patient: See directions   tamsulosin 0.4 MG Caps capsule Commonly known as: FLOMAX Take 0.4 mg by mouth 2 (two) times daily.       Discharge Instructions: Please refer to Patient Instructions section of EMR for full details.  Patient was counseled important signs and symptoms that should prompt return to medical care, changes in medications, dietary instructions, activity restrictions, and follow up appointments.   Follow-Up Appointments: Follow-up Information    Clinic, [place].   Why: Please follow up Contact information: [place] [address] [phone]        Care, [facility] Follow up.   Specialty: Burg[place] Why: RN, PT, OT, aide,  Contact information: [place] [address] [phone]           Gifford Shave, MD 11/05/2018, 1:33 PM PGY-1, [place]

## 2018-11-04 NOTE — Progress Notes (Signed)
His left cheek pain, redness, and swelling continue to improve.  Exam: HEENT: EOMI, PERRLA, Left cheek swollen(improved), mildly tender with improved erythema. Heart/Resp: S1 S2 normal, no murmur. Air entry equal B/L with left LL crackles. Abd: Soft, NT/ND, BS+, and normal. Ext: No, edema.  A/P: Facial cellulitis: FWe will transition to oral A/B today and have him f/u next week with his PCP. Complete a total of 10-14 day course of A/B, i.e., IV plus oral. ED precaution was discussed.  Acute on chronic stage IV kidney disease: Creatinine level increased s/p IV Lasix. We have now transitioned to oral Lasix at home dose. Need PCP f/u and nephrology referral as outpatient.  Mild acute diastolic CHF exacerbation S/P IV Lasix. Now back at his baseline. Outpatient f/u with his cardiologist.   DM2: Need outpatient A1C check with PCP. He requested transitioning his care from the [place] to [facility]. An appointment should be made for f/u upon d/c.

## 2018-11-04 NOTE — Progress Notes (Addendum)
Pt discharge instructions reviewed with pt and daughter. Pt and daughter verbalize understanding. Pt's daughter has questions for MD; MD states they will come to the room to answer her questions. Pt belongings with pt. Pt is not in distress. Pt's daughter [name] is legal guardian) is driving him home. Pharmacy brought scheduled prescription to pt's room.

## 2018-11-04 NOTE — Progress Notes (Signed)
Occupational Therapy Treatment Patient Details Name: Jacob [name] MRN: [id number] DOB: 08/17/1940 Today's Date: 11/04/2018    History of present illness Jacob [name] is a 78 y.o. male presenting with left facial swelling. PMH is significant for CAD, insulin-dependent T2DM, neuropathy, RA, PMR, OSA, COPD, CKD stage IV, HFpEF, paroxysmal atrial fibrillation on amiodarone and Eliquis.   OT comments  Pt completed toileting, standing grooming and dressing with supervision to min assist. Educated in fall prevention and reinforced with written handout. Pt is eager to go home later today.  Follow Up Recommendations  Home health OT;Supervision - Intermittent    Equipment Recommendations  None recommended by OT    Recommendations for Other Services      Precautions / Restrictions Precautions Precautions: Fall Restrictions Weight Bearing Restrictions: No       Mobility Bed Mobility               General bed mobility comments: pt in chair  Transfers Overall transfer level: Needs assistance Equipment used: Rolling walker (2 wheeled) Transfers: Sit to/from Stand Sit to Stand: Supervision         General transfer comment: for safety    Balance Overall balance assessment: History of Falls                                         ADL either performed or assessed with clinical judgement   ADL Overall ADL's : Needs assistance/impaired     Grooming: Min guard;Standing;Wash/dry hands         Lower Body Bathing Details (indicate cue type and reason): recommended long handled bath sponge Upper Body Dressing : Set up;Sitting   Lower Body Dressing: Sit to/from stand;Minimal assistance Lower Body Dressing Details (indicate cue type and reason): pt is familiar with AE for LB ADL, but states his hands don't work well enough to benefit Toilet Transfer: Ambulation;RW;Supervision/safety   Toileting- Water quality scientist and Hygiene: Supervision/safety;Sit  to/from stand       Functional mobility during ADLs: Supervision/safety;Rolling walker       Vision       Perception     Praxis      Cognition Arousal/Alertness: Awake/alert Behavior During Therapy: WFL for tasks assessed/performed Overall Cognitive Status: Within Functional Limits for tasks assessed                                          Exercises     Shoulder Instructions       General Comments      Pertinent Vitals/ Pain       Pain Assessment: Faces Faces Pain Scale: Hurts little more Pain Location: back, chronic Pain Descriptors / Indicators: Aching;Discomfort Pain Intervention(s): Heat applied  Home Living                                          Prior Functioning/Environment              Frequency  Min 2X/week        Progress Toward Goals  OT Goals(current goals can now be found in the care plan section)  Progress towards OT goals: Progressing toward goals  Acute Rehab OT Goals Patient Stated  Goal: to return home OT Goal Formulation: With patient Time For Goal Achievement: 11/16/18 Potential to Achieve Goals: Good  Plan Discharge plan remains appropriate    Co-evaluation                 AM-PAC OT "6 Clicks" Daily Activity     Outcome Measure   Help from another person eating meals?: None Help from another person taking care of personal grooming?: A Little Help from another person toileting, which includes using toliet, bedpan, or urinal?: A Little Help from another person bathing (including washing, rinsing, drying)?: A Little Help from another person to put on and taking off regular upper body clothing?: None Help from another person to put on and taking off regular lower body clothing?: A Little 6 Click Score: 20    End of Session Equipment Utilized During Treatment: Gait belt;Rolling walker  OT Visit Diagnosis: Unsteadiness on feet (R26.81);History of falling (Z91.81);Muscle  weakness (generalized) (M62.81);Pain   Activity Tolerance Patient tolerated treatment well   Patient Left in chair;with call bell/phone within reach   Nurse Communication          Time: 1001-1030 OT Time Calculation (min): 29 min  Charges: OT General Charges $OT Visit: 1 Visit OT Treatments $Self Care/Home Management : 23-37 mins  Nestor Lewandowsky, OTR/L Acute Rehabilitation Services Pager: [phone] Office: [phone]   Malka So 11/04/2018, 10:50 AM

## 2018-11-04 NOTE — TOC Transition Note (Addendum)
Transition of Care [facility]) - CM/SW Discharge Note   Patient Details  Name: GEDALIA MCMILLON MRN: [id number] Date of Birth: [birth year]  [facility] CM/SW Contact:  Zenon Mayo, RN Phone Number: 11/04/2018, 1:46 PM   Clinical Narrative:    Have not heard back from [facility], [place] tried calling , left message, patient is for dc.  Daughter said to try [facility], NCM made referral to [facility] with [facility] , he was able to take referral.  Soc will begin 24 to 48 hrs post dc.  He will be going thru his HTA insurance.  NCM spoke with MD for [place], [place], [place], [employer] orders. This needs to be in before he is dc.    Final next level of care: [place] Barriers to Discharge: No Barriers Identified   Patient Goals and CMS Choice Patient states their goals for this hospitalization and ongoing recovery are:: get better CMS Medicare.gov Compare Post Acute Care list provided to:: Patient Represenative (must comment)(daughter) Choice offered to / list presented to : Adult Children  Discharge Placement                       Discharge Plan and Services     Post Acute Care Choice: Home Health          DME Arranged: (NA)         HH Arranged: RN, PT, OT, Nurse's Aide HH Agency: [facility] Date [facility] Agency Contacted: 11/04/18 Time [place]: 74 Representative spoke with at [place]: cory  Social Determinants of Health ([place]) Interventions     Readmission Risk Interventions No flowsheet data found.

## 2018-11-04 NOTE — Discharge Instructions (Signed)
Thank you for allowing Jacob [name] to participate in your care!    You were admitted for cellulitis on your face.  You were started on IV vancomycin and received 3 days of treatment from that.  You have received an antibiotic, clindamycin. Please take three 150 mg  tablets(450 mg per dosing) 3 times a day for 7 days.  This is a total treatment time of 10 days (including the IV medication you received in the hospital).  I am going to schedule an appointment with our clinic next Wednesday at 1:50 pm with Dr. Tammi Klippel.  Please bring your health records if possible because you will be a new patient in our practice.  I will be your new primary care but I have no open slots for clinic visits next week.  We also diuresed you while you were admitted.  I want you to restart your Lasix daily as prescribed by your cardiologist.  Please keep track of your weights so that we know if you need further diuresis or not.  If you notice increased swelling, redness on your face, fever, please seek further treatment. If you experience worsening of your admission symptoms, develop shortness of breath, life threatening emergency, suicidal or homicidal thoughts you must seek medical attention immediately by calling 911 or calling your MD immediately  if symptoms less severe.    Cellulitis, Adult  Cellulitis is a skin infection. The infected area is often warm, red, swollen, and sore. It occurs most often in the arms and lower legs. It is very important to get treated for this condition. What are the causes? This condition is caused by bacteria. The bacteria enter through a break in the skin, such as a cut, burn, insect bite, open sore, or crack. What increases the risk? This condition is more likely to occur in people who:  Have a weak body defense system (immune system).  Have open cuts, burns, bites, or scrapes on the skin.  Are older than 79 years of age.  Have a blood sugar problem (diabetes).  Have a long-lasting  (chronic) liver disease (cirrhosis) or kidney disease.  Are very overweight (obese).  Have a skin problem, such as: ? Itchy rash (eczema). ? Slow movement of blood in the veins (venous stasis). ? Fluid buildup below the skin (edema).  Have been treated with high-energy rays (radiation).  Use IV drugs. What are the signs or symptoms? Symptoms of this condition include:  Skin that is: ? Red. ? Streaking. ? Spotting. ? Swollen. ? Sore or painful when you touch it. ? Warm.  A fever.  Chills.  Blisters. How is this diagnosed? This condition is diagnosed based on:  Medical history.  Physical exam.  Blood tests.  Imaging tests. How is this treated? Treatment for this condition may include:  Medicines to treat infections or allergies.  Home care, such as: ? Rest. ? Placing cold or warm cloths (compresses) on the skin.  Hospital care, if the condition is very bad. Follow these instructions at home: Medicines  Take over-the-counter and prescription medicines only as told by your doctor.  If you were prescribed an antibiotic medicine, take it as told by your doctor. Do not stop taking it even if you start to feel better. General instructions   Drink enough fluid to keep your pee (urine) pale yellow.  Do not touch or rub the infected area.  Raise (elevate) the infected area above the level of your heart while you are sitting or lying down.  Place  cold or warm cloths on the area as told by your doctor.  Keep all follow-up visits as told by your doctor. This is important. Contact a doctor if:  You have a fever.  You do not start to get better after 1-2 days of treatment.  Your bone or joint under the infected area starts to hurt after the skin has healed.  Your infection comes back. This can happen in the same area or another area.  You have a swollen bump in the area.  You have new symptoms.  You feel ill and have muscle aches and pains. Get help  right away if:  Your symptoms get worse.  You feel very sleepy.  You throw up (vomit) or have watery poop (diarrhea) for a long time.  You see red streaks coming from the area.  Your red area gets larger.  Your red area turns dark in color. These symptoms may represent a serious problem that is an emergency. Do not wait to see if the symptoms will go away. Get medical help right away. Call your local emergency services (911 in the [place] Do not drive yourself to the hospital. Summary  Cellulitis is a skin infection. The area is often warm, red, swollen, and sore.  This condition is treated with medicines, rest, and cold and warm cloths.  Take all medicines only as told by your doctor.  Tell your doctor if symptoms do not start to get better after 1-2 days of treatment. This information is not intended to replace advice given to you by your health care provider. Make sure you discuss any questions you have with your health care provider. Document Released: 08/19/2007 Document Revised: 07/22/2017 Document Reviewed: 07/22/2017 Elsevier Patient Education  2020 [employer].

## 2018-11-04 NOTE — Progress Notes (Signed)
Patient HR is at 53. MD notified, per MD hold metoprolol medication.

## 2018-11-05 ENCOUNTER — Telehealth: Payer: Self-pay | Admitting: *Deleted

## 2018-11-05 NOTE — Telephone Encounter (Signed)
TOC CM received call from pt's and gave permission to speak to dtr. Pt is complaining of indigestion or stomach rumbling after taking po abx. Pt wants to take Tums to settle stomach. Denies any other symptoms such as hives, shortness of breath, or throat irritation. Message sent out to attending, Dr Caron Presume MD. Pt can take tums and make sure pt is taking with food. Notified dtr with MD instructions and gave instructions to call back after he takes his 3 pm dose if problem persist and/or after taking tums prn. [facility]. Jonnie Finner RN CCM Case Mgmt phone [phone]

## 2018-11-06 ENCOUNTER — Telehealth: Payer: Self-pay | Admitting: *Deleted

## 2018-11-06 LAB — CULTURE, BLOOD (ROUTINE X 2)
Culture: NO GROWTH
Culture: NO GROWTH
Special Requests: ADEQUATE

## 2018-11-06 NOTE — Telephone Encounter (Signed)
**  After Hours/ Emergency Line Call**  Patient: Jacob [name]  Caller: patient's daughter Confirmed name & DOB of patient with caller  Subjective:  Caller reports that patient has not been using the bathroom and has not had a bowel movement since Wednesday in the hospital.  Per daughter, patient denies any abdominal tenderness, fevers, chills, bloody bowel movements, nausea, vomiting.  He has been drinking extra fluids during the day as well as taking laxatives and over-the-counter regimens to improve his constipation.  Daughter also reports that patient has had 10 pound weight gain since leaving the hospital 3 days ago.  He has been drinking extra fluids as mentioned above in the setting of constipation.  He is also drinking a bottle of water with every dose of antibiotics.  He is currently taking Lasix p.o. 40 mg daily.  Daughter also reports that patient's glucoses have been in the high 200s and 300s.  However, this morning it was 170.  Objective:  Observations: Patient answering questions in the background as daughter asks them.  Assessment & Plan  Jacob [name] is a 78 y.o. male with PMHx of CAD, CKD, COPD, hypertension, BPH who calls with the following complaints and concerns: Constipation after hospital admission as well as mild shortness of breath in setting of CHF with preserved ejection fraction.  Constipation -Decreased water intake in the setting of congestive heart failure and 10 pound weight gain -Continue to monitor, would expect patient -If any fevers, chills, abdominal pain or tenderness, or if patient is increasingly distended and unable to pass gas, patient report to the ED  Weight gain in setting of CHF During patient's most recent hospitalization, also treated for CHF exacerbation.  He was diuresed with IV diuretics.  He was discharged home on p.o. 40 mg Lasix.  Instructed family to increase Lasix 80 mg up to twice daily to increase urination and therefore decreased  fluid which is likely reason that patient has been short of breath.  He is still up and moving around and not in any distress but does notice dyspnea.  As patient is not in critical condition currently and is stable on room air, patient will not need to come to the emergency room unless further dyspnea.  Instead, have rescheduled patient to follow-up at [facility] tomorrow, August 24 instead of previously scheduled hospital follow-up on the 26th. -Please review patient's volume status as well as assess for SBO or other concerning findings that would present with constipation.  Patient's dry weight on discharge was 102.7 kg. -Please encourage patient to follow-up with PCP, Dr. Caron Presume, to officially establish care as he was requested to follow-up at [facility] after his hospiatlization.   Previously seen at the [place]. Access to care appointment made on the 24th will not be enough time to establish care.  -- other recs per d/c summary  -- Will forward to PCP.  Wilber Oliphant, M.D.  PGY-2  [facility] 11/06/2018 4:31 PM

## 2018-11-06 NOTE — Telephone Encounter (Addendum)
Follow up call, TOC CM spoke to pt's dtr, [name] and states she is thinking about bringing him back to ED. Pt complaining of shortness of breath, and increased swelling with a 10 lb weight gain since Friday. States [facility] scheduled to come out on Monday. Message sent to physician. Encouraged pt to seek medical attention if his symptoms persist or worsen. Jonnie Finner RN CCM Case Mgmt phone [phone]  3:03 pm Received message back from attending. Pt can call PCP's office after hours if he does not want to return to ED. Pt will seen in the [facility]. Provided dtr with contact number. Jonnie Finner RN CCM Case Mgmt phone [phone]

## 2018-11-07 ENCOUNTER — Encounter: Payer: PPO | Admitting: Physical Therapy

## 2018-11-07 ENCOUNTER — Encounter: Payer: Self-pay | Admitting: Family Medicine

## 2018-11-07 ENCOUNTER — Ambulatory Visit (INDEPENDENT_AMBULATORY_CARE_PROVIDER_SITE_OTHER): Payer: PPO | Admitting: Family Medicine

## 2018-11-07 ENCOUNTER — Telehealth: Payer: Self-pay | Admitting: *Deleted

## 2018-11-07 ENCOUNTER — Other Ambulatory Visit: Payer: Self-pay

## 2018-11-07 VITALS — BP 134/68 | HR 58 | Wt 237.0 lb

## 2018-11-07 DIAGNOSIS — K219 Gastro-esophageal reflux disease without esophagitis: Secondary | ICD-10-CM | POA: Diagnosis not present

## 2018-11-07 DIAGNOSIS — L03211 Cellulitis of face: Secondary | ICD-10-CM | POA: Diagnosis not present

## 2018-11-07 DIAGNOSIS — N184 Chronic kidney disease, stage 4 (severe): Secondary | ICD-10-CM

## 2018-11-07 DIAGNOSIS — I509 Heart failure, unspecified: Secondary | ICD-10-CM

## 2018-11-07 DIAGNOSIS — Z794 Long term (current) use of insulin: Secondary | ICD-10-CM | POA: Diagnosis not present

## 2018-11-07 DIAGNOSIS — E1169 Type 2 diabetes mellitus with other specified complication: Secondary | ICD-10-CM | POA: Diagnosis not present

## 2018-11-07 LAB — POCT GLYCOSYLATED HEMOGLOBIN (HGB A1C): HbA1c, POC (controlled diabetic range): 6.2 % (ref 0.0–7.0)

## 2018-11-07 MED ORDER — FUROSEMIDE 40 MG PO TABS: 40.0000 mg | ORAL_TABLET | Freq: Every day | ORAL | 0 refills | Status: DC

## 2018-11-07 MED ORDER — OMEPRAZOLE 20 MG PO CPDR: 20.0000 mg | DELAYED_RELEASE_CAPSULE | Freq: Every day | ORAL | 0 refills | Status: DC

## 2018-11-07 MED ORDER — ONDANSETRON HCL 4 MG PO TABS: 4.0000 mg | ORAL_TABLET | Freq: Three times a day (TID) | ORAL | 0 refills | Status: DC | PRN

## 2018-11-07 NOTE — Assessment & Plan Note (Signed)
With history of acid reflux and heartburn in the past.  No red flags on history.  Has previously seen GI with prior endoscopy though is not currently on PPI or H2 blocker.  Believe current issues taking antibiotics are more related to GERD rather than the antibiotics as he has had similar symptoms prior to his current antibiotic course.  Will trial a PPI.  If symptoms are still persistent despite trial and completion of antibiotic course, consider referral to GI for additional endoscopy.

## 2018-11-07 NOTE — Assessment & Plan Note (Addendum)
Rechecking creatinine today and again next week after increasing diuretic dose.  If AKI demonstrated in the hospital more related to cardiorenal syndrome, expect creatinine to improve with more diuresis, however was on IV vancomycin for cellulitis so was likely contributing.  Has follow-up nephrology appointment later this week.

## 2018-11-07 NOTE — Patient Instructions (Addendum)
It was great to see you!  Our plans for today:  - Take 80mg  of Lasix once daily for the next week. Let your kidney doctor know we increased this dose for this week. - Take your weight every day in the morning before you eat. - Continue to take the antibiotic. Take the omeprazole to help with your reflux symptoms. You can also take ondansetron if you are feeling nauseous. - If you are still having issues with heartburn after you finish the antibiotic, we may need to send you back to the GI doctor.  - Make an appointment to establish care with your new doctor, Dr. Caron Presume. We will give him your records to review.  We are checking some labs today, we will call you or send you a letter if they are abnormal.   Take care and seek immediate care sooner if you develop any concerns.   Dr. [facility]

## 2018-11-07 NOTE — Assessment & Plan Note (Addendum)
Improved with IV diuresis during recent admission, however has regained 11 pounds since discharge with marked evidence of volume overload with shortness of breath and pitting edema on exam.  Will have patient increase home Lasix to 80 mg daily.  We will recheck creatinine today and again in 1 week given CKD 4.  Advised patient to inform nephrologist of the recent increase in diuretic as well as inability of prior dose to maintain volume status and to check weights daily.

## 2018-11-07 NOTE — Assessment & Plan Note (Signed)
Improved.  Patient believes he is able to finish antibiotic course despite discomfort with taking antibiotic.  Per patient he is a MRSA carrier and already failed outpatient doxycycline, so other PO antibiotic choices would be limited.

## 2018-11-07 NOTE — Telephone Encounter (Signed)
Calls because he was supposed to get furosemide 40mg  two times a day not one time a day.  To provider. Christen Bame, CMA

## 2018-11-07 NOTE — Progress Notes (Signed)
Subjective:   Patient ID: Jacob [name]    DOB: 01-23-1941, 78 y.o. male   MRN: [id number]  Jacob [name] is a 78 y.o. male with a history of CAD, diastolic CHF, PAF, OSA, COPD, IDT2DM, RA, h/o renal cancer with CKD4, adjustment d/o, BPH, sarcoidosis here for   Hospital f/u - admitted 8/18-8/21 for facial cellulitis failing outpt doxycycline, treated with IV Vanc. Transitioned to PO Clinda on discharge. Has been doing well but does have significant heartburn with taking them. He is taking tums and is making sure he eats with it which has helped but he is still having heartburn. He thinks he can finish the antibiotic, he has not missed any doses.  He is due to finish antibiotic 8/27.  He does have a significant h/o heartburn and had an endoscopy many years ago. Last saw GI in March.  - Also with AoC HFpEF requiring IV diuresis.  Takes Lasix 40 mg daily.  Had an increase in shortness of breath since discharge.  Called after-hours line and was told to increase Lasix to 80 mg twice daily but did not do this as his nephrologist has told him not to increase this dose in the past.  Typically can walk about 200 feet before he gets short of breath but now he is getting short of breath while getting dressed.  He has been sleeping on an angle recently.  He feels okay at rest.  He has gained 10 pounds since discharge.  Per discharge summary, dry weight is believed to be about 102 kg.  He does not notice much increase in urination after he takes his 40 mg Lasix. -Also during after hours call 8/23 he reported no BM since 8/19 but has since had a bowel movement, had 2 this morning.  Denies abdominal pain.  He is passing flatus without difficulties.  Review of Systems:  Per HPI.  Jacob [name], medications and smoking status reviewed.  Objective:   BP 134/68   Pulse (!) 58   Wt 237 lb (107.5 kg)   SpO2 96%   BMI 33.05 kg/m  Vitals and nursing note reviewed.  General: overweight elderly male, in no acute distress  with non-toxic appearance HEENT: normocephalic, atraumatic, moist mucous membranes. ~4x2cm area of induration on L cheek without surrounding erythema or fluctuance.  CV: regular rate and rhythm without murmurs, rubs, or gallops, 3+ pitting lower extremity edema to knee. Lungs: clear to auscultation bilaterally with very faint bibasilar crackles. Normal work of breathing at rest, appropriately saturated on room air.  Skin: warm, dry, no rashes or lesions Extremities: warm and well perfused, normal tone MSK: ROM grossly intact, strength intact, gait normal Neuro: Alert and oriented, speech normal  Assessment & Plan:   Acute on chronic congestive heart failure (HCC) Improved with IV diuresis during recent admission, however has regained 11 pounds since discharge with marked evidence of volume overload with shortness of breath and pitting edema on exam.  Will have patient increase home Lasix to 80 mg daily.  We will recheck creatinine today and again in 1 week given CKD 4.  Advised patient to inform nephrologist of the recent increase in diuretic as well as inability of prior dose to maintain volume status and to check weights daily.  CKD (chronic kidney disease) Rechecking creatinine today and again next week after increasing diuretic dose.  If AKI demonstrated in the hospital more related to cardiorenal syndrome, expect creatinine to improve with more diuresis, however was on IV vancomycin for  cellulitis so was likely contributing.  Has follow-up nephrology appointment later this week.  Facial cellulitis Improved.  Patient believes he is able to finish antibiotic course despite discomfort with taking antibiotic.  Per patient he is a MRSA carrier and already failed outpatient doxycycline, so other PO antibiotic choices would be limited.  GERD (gastroesophageal reflux disease) With history of acid reflux and heartburn in the past.  No red flags on history.  Has previously seen GI with prior  endoscopy though is not currently on PPI or H2 blocker.  Believe current issues taking antibiotics are more related to GERD rather than the antibiotics as he has had similar symptoms prior to his current antibiotic course.  Will trial a PPI.  If symptoms are still persistent despite trial and completion of antibiotic course, consider referral to GI for additional endoscopy.  Orders Placed This Encounter  Procedures  . Basic Metabolic Panel  . HgB A1c   Meds ordered this encounter  Medications  . DISCONTD: omeprazole (PRILOSEC) 20 MG capsule    Sig: Take 1 capsule (20 mg total) by mouth daily.    Dispense:  30 capsule    Refill:  0  . DISCONTD: ondansetron (ZOFRAN) 4 MG tablet    Sig: Take 1 tablet (4 mg total) by mouth every 8 (eight) hours as needed for nausea or vomiting.    Dispense:  20 tablet    Refill:  0  . DISCONTD: furosemide (LASIX) 40 MG tablet    Sig: Take 1 tablet (40 mg total) by mouth daily.    Dispense:  90 tablet    Refill:  0  . furosemide (LASIX) 40 MG tablet    Sig: Take 1 tablet (40 mg total) by mouth daily.    Dispense:  90 tablet    Refill:  0  . omeprazole (PRILOSEC) 20 MG capsule    Sig: Take 1 capsule (20 mg total) by mouth daily.    Dispense:  30 capsule    Refill:  0  . ondansetron (ZOFRAN) 4 MG tablet    Sig: Take 1 tablet (4 mg total) by mouth every 8 (eight) hours as needed for nausea or vomiting.    Dispense:  20 tablet    Refill:  0    Rory Percy, DO PGY-3, [facility] 11/07/2018 4:41 PM

## 2018-11-08 DIAGNOSIS — E1122 Type 2 diabetes mellitus with diabetic chronic kidney disease: Secondary | ICD-10-CM | POA: Diagnosis not present

## 2018-11-08 DIAGNOSIS — D631 Anemia in chronic kidney disease: Secondary | ICD-10-CM | POA: Diagnosis not present

## 2018-11-08 DIAGNOSIS — M47812 Spondylosis without myelopathy or radiculopathy, cervical region: Secondary | ICD-10-CM | POA: Diagnosis not present

## 2018-11-08 DIAGNOSIS — M1990 Unspecified osteoarthritis, unspecified site: Secondary | ICD-10-CM | POA: Diagnosis not present

## 2018-11-08 DIAGNOSIS — I5033 Acute on chronic diastolic (congestive) heart failure: Secondary | ICD-10-CM | POA: Diagnosis not present

## 2018-11-08 DIAGNOSIS — I130 Hypertensive heart and chronic kidney disease with heart failure and stage 1 through stage 4 chronic kidney disease, or unspecified chronic kidney disease: Secondary | ICD-10-CM | POA: Diagnosis not present

## 2018-11-08 DIAGNOSIS — I2510 Atherosclerotic heart disease of native coronary artery without angina pectoris: Secondary | ICD-10-CM | POA: Diagnosis not present

## 2018-11-08 DIAGNOSIS — G4733 Obstructive sleep apnea (adult) (pediatric): Secondary | ICD-10-CM | POA: Diagnosis not present

## 2018-11-08 DIAGNOSIS — M353 Polymyalgia rheumatica: Secondary | ICD-10-CM | POA: Diagnosis not present

## 2018-11-08 DIAGNOSIS — J9811 Atelectasis: Secondary | ICD-10-CM | POA: Diagnosis not present

## 2018-11-08 DIAGNOSIS — M1030 Gout due to renal impairment, unspecified site: Secondary | ICD-10-CM | POA: Diagnosis not present

## 2018-11-08 DIAGNOSIS — M48061 Spinal stenosis, lumbar region without neurogenic claudication: Secondary | ICD-10-CM | POA: Diagnosis not present

## 2018-11-08 DIAGNOSIS — J449 Chronic obstructive pulmonary disease, unspecified: Secondary | ICD-10-CM | POA: Diagnosis not present

## 2018-11-08 DIAGNOSIS — K759 Inflammatory liver disease, unspecified: Secondary | ICD-10-CM | POA: Diagnosis not present

## 2018-11-08 DIAGNOSIS — I700 Atherosclerosis of aorta: Secondary | ICD-10-CM | POA: Diagnosis not present

## 2018-11-08 DIAGNOSIS — L03211 Cellulitis of face: Secondary | ICD-10-CM | POA: Diagnosis not present

## 2018-11-08 DIAGNOSIS — F4329 Adjustment disorder with other symptoms: Secondary | ICD-10-CM | POA: Diagnosis not present

## 2018-11-08 DIAGNOSIS — M5116 Intervertebral disc disorders with radiculopathy, lumbar region: Secondary | ICD-10-CM | POA: Diagnosis not present

## 2018-11-08 DIAGNOSIS — E1142 Type 2 diabetes mellitus with diabetic polyneuropathy: Secondary | ICD-10-CM | POA: Diagnosis not present

## 2018-11-08 DIAGNOSIS — M069 Rheumatoid arthritis, unspecified: Secondary | ICD-10-CM | POA: Diagnosis not present

## 2018-11-08 DIAGNOSIS — N184 Chronic kidney disease, stage 4 (severe): Secondary | ICD-10-CM | POA: Diagnosis not present

## 2018-11-08 DIAGNOSIS — I959 Hypotension, unspecified: Secondary | ICD-10-CM | POA: Diagnosis not present

## 2018-11-08 DIAGNOSIS — G9340 Encephalopathy, unspecified: Secondary | ICD-10-CM | POA: Diagnosis not present

## 2018-11-08 DIAGNOSIS — I480 Paroxysmal atrial fibrillation: Secondary | ICD-10-CM | POA: Diagnosis not present

## 2018-11-08 DIAGNOSIS — E785 Hyperlipidemia, unspecified: Secondary | ICD-10-CM | POA: Diagnosis not present

## 2018-11-08 LAB — BASIC METABOLIC PANEL
BUN/Creatinine Ratio: 16 (ref 10–24)
BUN: 51 mg/dL — ABNORMAL HIGH (ref 8–27)
CO2: 24 mmol/L (ref 20–29)
Calcium: 9.1 mg/dL (ref 8.6–10.2)
Chloride: 100 mmol/L (ref 96–106)
Creatinine, Ser: 3.22 mg/dL — ABNORMAL HIGH (ref 0.76–1.27)
GFR calc Af Amer: 20 mL/min/{1.73_m2} — ABNORMAL LOW (ref >59)
GFR calc non Af Amer: 17 mL/min/{1.73_m2} — ABNORMAL LOW (ref >59)
Glucose: 146 mg/dL — ABNORMAL HIGH (ref 65–99)
Potassium: 4.5 mmol/L (ref 3.5–5.2)
Sodium: 140 mmol/L (ref 134–144)

## 2018-11-09 ENCOUNTER — Other Ambulatory Visit: Payer: Self-pay | Admitting: Cardiology

## 2018-11-09 ENCOUNTER — Inpatient Hospital Stay: Payer: No Typology Code available for payment source | Admitting: Family Medicine

## 2018-11-09 ENCOUNTER — Encounter: Payer: PPO | Admitting: Physical Therapy

## 2018-11-09 NOTE — Telephone Encounter (Signed)
Spoke to daughter. Clarified he is to take lasix 80mg  (2 tabs) once daily, see result note from earlier today. Daughter verbalized understanding.

## 2018-11-14 ENCOUNTER — Encounter: Payer: PPO | Admitting: Physical Therapy

## 2018-11-14 ENCOUNTER — Ambulatory Visit (INDEPENDENT_AMBULATORY_CARE_PROVIDER_SITE_OTHER): Payer: PPO | Admitting: Family Medicine

## 2018-11-14 ENCOUNTER — Encounter: Payer: Self-pay | Admitting: Family Medicine

## 2018-11-14 ENCOUNTER — Other Ambulatory Visit: Payer: Self-pay

## 2018-11-14 VITALS — BP 138/70 | HR 60 | Wt 224.8 lb

## 2018-11-14 DIAGNOSIS — L03211 Cellulitis of face: Secondary | ICD-10-CM | POA: Diagnosis not present

## 2018-11-14 DIAGNOSIS — G4733 Obstructive sleep apnea (adult) (pediatric): Secondary | ICD-10-CM

## 2018-11-14 DIAGNOSIS — M48061 Spinal stenosis, lumbar region without neurogenic claudication: Secondary | ICD-10-CM | POA: Diagnosis not present

## 2018-11-14 DIAGNOSIS — N184 Chronic kidney disease, stage 4 (severe): Secondary | ICD-10-CM

## 2018-11-14 DIAGNOSIS — M5416 Radiculopathy, lumbar region: Secondary | ICD-10-CM

## 2018-11-14 DIAGNOSIS — I509 Heart failure, unspecified: Secondary | ICD-10-CM

## 2018-11-14 MED ORDER — FUROSEMIDE 40 MG PO TABS: 40.0000 mg | ORAL_TABLET | Freq: Two times a day (BID) | ORAL | 1 refills | Status: DC

## 2018-11-14 NOTE — Assessment & Plan Note (Signed)
Counseled on appropriate use of CPAP and risks of non-compliance. May be beneficial to provide qhs anxiety relief to promote compliance, would defer to PCP.

## 2018-11-14 NOTE — Assessment & Plan Note (Addendum)
Improved with increase in lasix symptomatically as well as down 12 lbs from last week and less evidence of volume overload on exam. Continue with 40mg  BID, new rx sent. Will recheck Cr today. F/u in one month with PCP.

## 2018-11-14 NOTE — Patient Instructions (Addendum)
It was great to see you!  Our plans for today:  - Continue to take Lasix 40 mg twice daily as you have been doing.   - We are rechecking your kidney function today.  Will call you with these results. - Follow-up with Dr. Caron Presume in 1 month.  Take care and seek immediate care sooner if you develop any concerns.   Dr. [facility]

## 2018-11-14 NOTE — Assessment & Plan Note (Signed)
Will recheck Cr today, Cr did downtrend by recheck at nephrology office 4 days after increase so suspect AKI was indeed cardiorenal.

## 2018-11-14 NOTE — Progress Notes (Signed)
  Subjective:   Patient ID: Jacob [name]    DOB: 06/10/1940, 78 y.o. male   MRN: [id number]  COAL NEAR[place]OD is a 78 y.o. male with a history of HFpEF, CAD, PAF, COPD, OSA, GERD, type 2 diabetes, spinal stenosis, RA, CKD 4, history of renal cancer, adjustment disorder here for   CHF -Here for follow-up after increasing p.o. diuresis.  At hospital follow-up last week he had been complaining of shortness of breath and lower extremity swelling since discharge.  Had gained 12lbs. Was told to increase Lasix to 80 mg daily. Has instead been taking 40mg  BID, urinating well. - Saw nephrology last week. Cr 2.9 Thursday. - Has been checking weights daily at home, went from 240 to 220lbs - breathing is better but does report some headache and congestion in the morning. Is supposed to wear a CPAP but has issues with claustrophobia and doesn't wear it. Reports he was on oxygen at night in the hospital.  Facial cellulitis Completed p.o. clindamycin course 8/27 without further issues. Denies fevers, pain.   Heartburn Trialed PPI at last visit although reports he has had no issues with acid reflux recently and actually has not been taking PPI. Reports in the past he has had what feels like "hot water come up" into his mouth. Hasn't had any episodes like this recently.   Review of Systems:  Per HPI.  [place], medications and smoking status reviewed.  Objective:   BP 138/70   Pulse 60   Wt 224 lb 12.8 oz (102 kg)   SpO2 96%   BMI 31.35 kg/m  Vitals and nursing note reviewed.  General: obese elderly male, in no acute distress with non-toxic appearance HEENT: normocephalic, atraumatic, moist mucous membranes. No erythema or warmth of R cheek, small area of induration. CV: regular rate and rhythm without murmurs, rubs, or gallops, 1+ pitting edema to shins bilaterally Lungs: faint bibasilar crackles with normal work of breathing on room air Skin: warm, dry, no rashes or lesions Extremities: warm and well  perfused, normal tone MSK: ROM grossly intact, strength intact, gait normal Neuro: Alert and oriented, speech normal  Assessment & Plan:   OSA (obstructive sleep apnea) Counseled on appropriate use of CPAP and risks of non-compliance. May be beneficial to provide qhs anxiety relief to promote compliance, would defer to PCP.  Acute on chronic congestive heart failure (HCC) Improved with increase in lasix symptomatically as well as down 12 lbs from last week and less evidence of volume overload on exam. Continue with 40mg  BID, new rx sent. Will recheck Cr today. F/u in one month with PCP.  CKD (chronic kidney disease) Will recheck Cr today, Cr did downtrend by recheck at nephrology office 4 days after increase so suspect AKI was indeed cardiorenal.   Cellulitis Completed antibiotic course, resolved on exam without further issues.  Orders Placed This Encounter  Procedures  . For home use only DME Other see comment    [employer] heating pump    Order Specific Question:   Length of Need    Answer:   [employer]etime  . Basic Metabolic Panel   Meds ordered this encounter  Medications  . furosemide (LASIX) 40 MG tablet    Sig: Take 1 tablet (40 mg total) by mouth 2 (two) times daily.    Dispense:  180 tablet    Refill:  [place], DO PGY-3, [facility] 11/14/2018 6:42 PM

## 2018-11-14 NOTE — Assessment & Plan Note (Signed)
Completed antibiotic course, resolved on exam without further issues.

## 2018-11-15 LAB — BASIC METABOLIC PANEL
BUN/Creatinine Ratio: 15 (ref 10–24)
BUN: 50 mg/dL — ABNORMAL HIGH (ref 8–27)
CO2: 26 mmol/L (ref 20–29)
Calcium: 9.6 mg/dL (ref 8.6–10.2)
Chloride: 100 mmol/L (ref 96–106)
Creatinine, Ser: 3.26 mg/dL — ABNORMAL HIGH (ref 0.76–1.27)
GFR calc Af Amer: 20 mL/min/{1.73_m2} — ABNORMAL LOW (ref >59)
GFR calc non Af Amer: 17 mL/min/{1.73_m2} — ABNORMAL LOW (ref >59)
Glucose: 140 mg/dL — ABNORMAL HIGH (ref 65–99)
Potassium: 4.3 mmol/L (ref 3.5–5.2)
Sodium: 145 mmol/L — ABNORMAL HIGH (ref 134–144)

## 2018-11-16 DIAGNOSIS — I130 Hypertensive heart and chronic kidney disease with heart failure and stage 1 through stage 4 chronic kidney disease, or unspecified chronic kidney disease: Secondary | ICD-10-CM | POA: Diagnosis not present

## 2018-11-16 DIAGNOSIS — K759 Inflammatory liver disease, unspecified: Secondary | ICD-10-CM | POA: Diagnosis not present

## 2018-11-16 DIAGNOSIS — L03211 Cellulitis of face: Secondary | ICD-10-CM | POA: Diagnosis not present

## 2018-11-16 DIAGNOSIS — E1142 Type 2 diabetes mellitus with diabetic polyneuropathy: Secondary | ICD-10-CM | POA: Diagnosis not present

## 2018-11-16 DIAGNOSIS — M069 Rheumatoid arthritis, unspecified: Secondary | ICD-10-CM | POA: Diagnosis not present

## 2018-11-16 DIAGNOSIS — G4733 Obstructive sleep apnea (adult) (pediatric): Secondary | ICD-10-CM | POA: Diagnosis not present

## 2018-11-16 DIAGNOSIS — J9811 Atelectasis: Secondary | ICD-10-CM | POA: Diagnosis not present

## 2018-11-16 DIAGNOSIS — D631 Anemia in chronic kidney disease: Secondary | ICD-10-CM | POA: Diagnosis not present

## 2018-11-16 DIAGNOSIS — I959 Hypotension, unspecified: Secondary | ICD-10-CM | POA: Diagnosis not present

## 2018-11-16 DIAGNOSIS — M48061 Spinal stenosis, lumbar region without neurogenic claudication: Secondary | ICD-10-CM | POA: Diagnosis not present

## 2018-11-16 DIAGNOSIS — I5033 Acute on chronic diastolic (congestive) heart failure: Secondary | ICD-10-CM | POA: Diagnosis not present

## 2018-11-16 DIAGNOSIS — I2510 Atherosclerotic heart disease of native coronary artery without angina pectoris: Secondary | ICD-10-CM | POA: Diagnosis not present

## 2018-11-16 DIAGNOSIS — G9340 Encephalopathy, unspecified: Secondary | ICD-10-CM | POA: Diagnosis not present

## 2018-11-16 DIAGNOSIS — F4329 Adjustment disorder with other symptoms: Secondary | ICD-10-CM | POA: Diagnosis not present

## 2018-11-16 DIAGNOSIS — I480 Paroxysmal atrial fibrillation: Secondary | ICD-10-CM | POA: Diagnosis not present

## 2018-11-16 DIAGNOSIS — M1030 Gout due to renal impairment, unspecified site: Secondary | ICD-10-CM | POA: Diagnosis not present

## 2018-11-16 DIAGNOSIS — J449 Chronic obstructive pulmonary disease, unspecified: Secondary | ICD-10-CM | POA: Diagnosis not present

## 2018-11-16 DIAGNOSIS — M353 Polymyalgia rheumatica: Secondary | ICD-10-CM | POA: Diagnosis not present

## 2018-11-16 DIAGNOSIS — M5116 Intervertebral disc disorders with radiculopathy, lumbar region: Secondary | ICD-10-CM | POA: Diagnosis not present

## 2018-11-16 DIAGNOSIS — I700 Atherosclerosis of aorta: Secondary | ICD-10-CM | POA: Diagnosis not present

## 2018-11-16 DIAGNOSIS — E1122 Type 2 diabetes mellitus with diabetic chronic kidney disease: Secondary | ICD-10-CM | POA: Diagnosis not present

## 2018-11-16 DIAGNOSIS — E785 Hyperlipidemia, unspecified: Secondary | ICD-10-CM | POA: Diagnosis not present

## 2018-11-16 DIAGNOSIS — M1990 Unspecified osteoarthritis, unspecified site: Secondary | ICD-10-CM | POA: Diagnosis not present

## 2018-11-16 DIAGNOSIS — N184 Chronic kidney disease, stage 4 (severe): Secondary | ICD-10-CM | POA: Diagnosis not present

## 2018-11-16 DIAGNOSIS — M47812 Spondylosis without myelopathy or radiculopathy, cervical region: Secondary | ICD-10-CM | POA: Diagnosis not present

## 2018-11-29 ENCOUNTER — Other Ambulatory Visit: Payer: Self-pay | Admitting: *Deleted

## 2018-11-29 ENCOUNTER — Telehealth: Payer: Self-pay | Admitting: *Deleted

## 2018-11-29 MED ORDER — OMEPRAZOLE 20 MG PO CPDR: 20.0000 mg | DELAYED_RELEASE_CAPSULE | Freq: Every day | ORAL | 12 refills | Status: DC

## 2018-11-29 NOTE — Telephone Encounter (Signed)
Spoke with wife and she is aware of appt.  States that husband is urinating a normal amount but hasn't had a bowel movement in about 3 days.  Will forward to MD who is on access to care. Maniyah Moller,CMA

## 2018-11-29 NOTE — Telephone Encounter (Signed)
Pts wife calls because pt "feels bloated today" and has gained 8 lbs since 11/14/18.  She states that he is taking 40mg  furosemide Twice daily and denies any trouble breathing.  States that he "just feels bloated".  We have no openings today.  Will forward to PCP and Dr Tammi Klippel who is covering for Dr. Ky Barban who saw him last. Christen Bame, CMA

## 2018-11-29 NOTE — Telephone Encounter (Signed)
Have scheduled patient in ATC for 10:30 AM on 11/30/18. Please inform them of this visit. If he wants to come in in person please change to office visit from virtual visit.   Dalphine Handing, PGY-3 [facility] 11/29/2018 3:31 PM

## 2018-11-30 ENCOUNTER — Telehealth (INDEPENDENT_AMBULATORY_CARE_PROVIDER_SITE_OTHER): Payer: PPO | Admitting: Family Medicine

## 2018-11-30 ENCOUNTER — Other Ambulatory Visit: Payer: Self-pay

## 2018-11-30 DIAGNOSIS — R140 Abdominal distension (gaseous): Secondary | ICD-10-CM | POA: Diagnosis not present

## 2018-11-30 DIAGNOSIS — I5032 Chronic diastolic (congestive) heart failure: Secondary | ICD-10-CM

## 2018-12-02 DIAGNOSIS — I5033 Acute on chronic diastolic (congestive) heart failure: Secondary | ICD-10-CM | POA: Diagnosis not present

## 2018-12-02 DIAGNOSIS — J9811 Atelectasis: Secondary | ICD-10-CM | POA: Diagnosis not present

## 2018-12-02 DIAGNOSIS — D631 Anemia in chronic kidney disease: Secondary | ICD-10-CM | POA: Diagnosis not present

## 2018-12-02 DIAGNOSIS — I130 Hypertensive heart and chronic kidney disease with heart failure and stage 1 through stage 4 chronic kidney disease, or unspecified chronic kidney disease: Secondary | ICD-10-CM | POA: Diagnosis not present

## 2018-12-02 DIAGNOSIS — E1142 Type 2 diabetes mellitus with diabetic polyneuropathy: Secondary | ICD-10-CM | POA: Diagnosis not present

## 2018-12-02 DIAGNOSIS — K759 Inflammatory liver disease, unspecified: Secondary | ICD-10-CM | POA: Diagnosis not present

## 2018-12-02 DIAGNOSIS — G9340 Encephalopathy, unspecified: Secondary | ICD-10-CM | POA: Diagnosis not present

## 2018-12-02 DIAGNOSIS — G4733 Obstructive sleep apnea (adult) (pediatric): Secondary | ICD-10-CM | POA: Diagnosis not present

## 2018-12-02 DIAGNOSIS — M069 Rheumatoid arthritis, unspecified: Secondary | ICD-10-CM | POA: Diagnosis not present

## 2018-12-02 DIAGNOSIS — M1990 Unspecified osteoarthritis, unspecified site: Secondary | ICD-10-CM | POA: Diagnosis not present

## 2018-12-02 DIAGNOSIS — I700 Atherosclerosis of aorta: Secondary | ICD-10-CM | POA: Diagnosis not present

## 2018-12-02 DIAGNOSIS — E1122 Type 2 diabetes mellitus with diabetic chronic kidney disease: Secondary | ICD-10-CM | POA: Diagnosis not present

## 2018-12-02 DIAGNOSIS — E785 Hyperlipidemia, unspecified: Secondary | ICD-10-CM | POA: Diagnosis not present

## 2018-12-02 DIAGNOSIS — I2510 Atherosclerotic heart disease of native coronary artery without angina pectoris: Secondary | ICD-10-CM | POA: Diagnosis not present

## 2018-12-02 DIAGNOSIS — J449 Chronic obstructive pulmonary disease, unspecified: Secondary | ICD-10-CM | POA: Diagnosis not present

## 2018-12-02 DIAGNOSIS — I959 Hypotension, unspecified: Secondary | ICD-10-CM | POA: Diagnosis not present

## 2018-12-02 DIAGNOSIS — M1030 Gout due to renal impairment, unspecified site: Secondary | ICD-10-CM | POA: Diagnosis not present

## 2018-12-02 DIAGNOSIS — N184 Chronic kidney disease, stage 4 (severe): Secondary | ICD-10-CM | POA: Diagnosis not present

## 2018-12-02 DIAGNOSIS — L03211 Cellulitis of face: Secondary | ICD-10-CM | POA: Diagnosis not present

## 2018-12-02 DIAGNOSIS — M47812 Spondylosis without myelopathy or radiculopathy, cervical region: Secondary | ICD-10-CM | POA: Diagnosis not present

## 2018-12-02 DIAGNOSIS — F4329 Adjustment disorder with other symptoms: Secondary | ICD-10-CM | POA: Diagnosis not present

## 2018-12-02 DIAGNOSIS — M5116 Intervertebral disc disorders with radiculopathy, lumbar region: Secondary | ICD-10-CM | POA: Diagnosis not present

## 2018-12-02 DIAGNOSIS — M353 Polymyalgia rheumatica: Secondary | ICD-10-CM | POA: Diagnosis not present

## 2018-12-02 DIAGNOSIS — I480 Paroxysmal atrial fibrillation: Secondary | ICD-10-CM | POA: Diagnosis not present

## 2018-12-02 DIAGNOSIS — M48061 Spinal stenosis, lumbar region without neurogenic claudication: Secondary | ICD-10-CM | POA: Diagnosis not present

## 2018-12-03 NOTE — Progress Notes (Signed)
[place] Telemedicine Visit  Patient consented to have virtual visit. Method of visit: Telephone  Encounter participants: Patient: Jacob [name] - located at home Provider: Guadalupe Dawn - [facility] at [facility] Others (if applicable): none  Chief Complaint: diuretic managment  HPI: 77 year old male who presents for diuretic management.  The patient originally made the appointment due to abdominal bloating.  He states that on the day prior to his telephone visit his urination picked up quite a bit and he felt his abdomen "opened up".  He feels much better from a bloating standpoint but is seeking advice on his diuretic.  He states that he is going several times per day still.  He weighed himself and is up about 4 pounds from previous visit.  He has not noted any new swelling or increased swelling from last visit.  He is taking his Lasix 40 mg twice daily as prescribed by his PCP and nephrologist.  He is wondering if he should continue taking this dose until his follow-up in person with his PCP.  ROS: per HPI  Pertinent PMHx: HFpEF  Exam:  General: Well sounding male, no acute distress Respiratory: Able speak in clear coherent sentences with no distress Psych: Appropriate thought process, very pleasant  Assessment/Plan:  No problem-specific Assessment & Plan notes found for this encounter.    Time spent during visit with patient: 13 minutes

## 2018-12-03 NOTE — Assessment & Plan Note (Signed)
Up about 4 pounds from last clinic visit, although there is the possibility with some small variation between the clinic scale on his home scale.  Has not noted any increased swelling.  Urinating several times per day.  Can continue taking the Lasix 40 mg twice daily, until his next follow-up in October.  Reviewed most to most recent BMPs and creatinine was stable on this dose, both readings around 3.2 taken 1 week apart.

## 2018-12-08 ENCOUNTER — Telehealth: Payer: Self-pay | Admitting: Cardiology

## 2018-12-08 ENCOUNTER — Encounter: Payer: Self-pay | Admitting: Neurology

## 2018-12-08 NOTE — Telephone Encounter (Signed)
Has questions about him stopping eliquis for a precedure

## 2018-12-08 NOTE — Telephone Encounter (Signed)
Attempted to contact patient's wife, Adonis Huguenin, per [facility] with no answer. Unable to leave voicemail. Will continue efforts.

## 2018-12-08 NOTE — Telephone Encounter (Signed)
[name] calling back. Please call.  She said she must speak to someone this afternoon

## 2018-12-09 ENCOUNTER — Telehealth: Payer: Self-pay

## 2018-12-09 NOTE — Telephone Encounter (Signed)
Spoke with Adonis Huguenin who explained that patient is scheduled for injections into his legs on Monday, 12/12/18, at [facility] with Dr. Posey Pronto. Adonis Huguenin states this was arranged by Dr. Towanda Malkin office with the [facility] who contacted our office regarding instructions for holding eliquis prior to injections. Adonis Huguenin wanted to confirm that these instructions are correct. Adonis Huguenin states patient was advised to hold eliqiuis on 12/10/18, 12/11/18, and the morning of 12/12/18.   Spoke with Laurann Montana, NP who confirmed that these instructions were correct. Informed Adonis Huguenin of this information and advised her that Dr. Posey Pronto will inform patient of when to resume eliquis after injections. Adonis Huguenin is agreeable and verbalized understanding. No further questions.

## 2018-12-12 ENCOUNTER — Ambulatory Visit (INDEPENDENT_AMBULATORY_CARE_PROVIDER_SITE_OTHER): Payer: No Typology Code available for payment source | Admitting: Neurology

## 2018-12-12 ENCOUNTER — Encounter: Payer: Self-pay | Admitting: Neurology

## 2018-12-12 ENCOUNTER — Other Ambulatory Visit: Payer: Self-pay

## 2018-12-12 VITALS — BP 120/80 | HR 78 | Ht 71.0 in | Wt 235.0 lb

## 2018-12-12 DIAGNOSIS — M5416 Radiculopathy, lumbar region: Secondary | ICD-10-CM | POA: Diagnosis not present

## 2018-12-12 DIAGNOSIS — E1142 Type 2 diabetes mellitus with diabetic polyneuropathy: Secondary | ICD-10-CM | POA: Diagnosis not present

## 2018-12-12 NOTE — Telephone Encounter (Signed)
Verified patient information as it relates to any open calls concerning medications.

## 2018-12-12 NOTE — Progress Notes (Signed)
[facility] Clinic Note - Initial Visit   Date: 12/12/18  Jacob [name] MRN: [id number] DOB: 06/14/1940   Dear Dr Patrice Paradise:  Thank you for your kind referral of Jacob [name] for consultation of bilateral leg weakness. Although his history is well known to you, please allow [name] to reiterate it for the purpose of our medical record. The patient was accompanied to the clinic by self.   History of Present Illness: Jacob [name] is a 78 y.o. right-handed male with hypertension, OSA, CAD s/p CABG, CKD, diabetes, COPD, polymyalgia rheumatica, anxiety, lumbar stenosis s/p surgery  presenting for evaluation of bilateral leg weakness.   He underwent lumbar decompression at L3-4 on 07/30/2015 for and since this time, has developed increased numbness, pain, and weakness in the legs.  His initial surgery was complicated by early disciitis/osteomyelitis and he was treated with antibiotics and redo L3-4 discectomy on 09/10/2015.  He had extended rehab stab and ultimately worked his way back to ambulating with a walker from being wheelchair-bound.  He has ongoing imbalance and has been using a walker since his surgery.  He recently completed physical therapy for leg strengthening and balance training, however he denies any marked improvement.  He has fallen 3 times this year, all which have occurred when he was transferring.  He has difficulty with standing up from low chairs, transfers, and climbing stairs.  He is followed by Dr. Herma Mering for his back pain and has underwent a series of injections, without any improvement.  He had an MRI lumbar spine performed at the [place] earlier this year, however, I do not have this report or images to review personally.  He has diabetic neuropathy in the legs for the past 10 years.  He has numbness in the feet and lower legs.  His right leg has more weakness and pain, specifically sharp pain over the lateral lower leg.  He lives on a one-level home.   He used to drink heavily (12 beers daily + 2-3 shots of liquor) for 20 years and quit in 1987.  His diabetes is well-controlled, last HbA1c 6.2.  No family history of neuropathy.     Out-side paper records, electronic medical record, and images have been reviewed where available and summarized as: MRI lumbar spine wwo contrast 10/03/2015: Status post redo interval RIGHT L3 hemilaminectomy for resection of disc extrusion with minimal residual suspected component, ventral epidural hematoma and/or granulation tissue.  1.4 x 2.5 x 3.3 cm rim enhancing paraspinal fluid collection concerning for abscess. 7 x 15 mm fluid collection in RIGHT laminectomy defect equivocal for abscess, and could represent seroma or possibly hematoma.  No convincing evidence of discitis osteomyelitis.  Similar degenerative lumbar spine resulting in mild canal stenosis L3-4 and L4-5. Neural foraminal narrowing L3-4 through L5-S1: Moderate to severe at L3-4 and L5-S1.  Lab Results  Component Value Date   HGBA1C 6.2 11/07/2018   Lab Results  Component Value Date   [id number] 676 (H) 09/02/2015   Lab Results  Component Value Date   TSH 5.850 (H) 08/23/2018     Past Medical History:  Diagnosis Date  . Allergy   . Anemia   . Anxiety   . Arthritis   . Asthma   . BPH (benign prostatic hyperplasia)   . Cancer of kidney ([place])   . Cataract   . CHF (congestive heart failure) ([place])   . Chronic kidney disease    chronic  kidney failure  kidney function at 42%  .  COPD (chronic obstructive pulmonary disease) ([place])   . Coronary artery disease    CABG  7 bypasses  . Diabetes mellitus without complication ([place])   . GERD (gastroesophageal reflux disease)   . Gout   . Hepatitis    many years ago  . Hyperlipidemia   . Hypertension   . Lumbar disc disease   . Obesity   . Paroxysmal atrial fibrillation (HCC)   . Peripheral neuropathy   . Polymyalgia rheumatica (HCC)    maintained on Prednisone, Plaquenil.  Followed by rhuematology every 4 months/[name].  . Shortness of breath dyspnea    with exertion  . Sleep apnea    CPAP   Trying to use    Past Surgical History:  Procedure Laterality Date  . APPENDECTOMY    . CARDIAC CATHETERIZATION  1998, 2000   left  . CATARACT EXTRACTION W/PHACO Right 11/28/2014   Procedure: CATARACT EXTRACTION PHACO AND INTRAOCULAR LENS PLACEMENT ([place]) RIGHT ;  Surgeon: Marylynn Pearson, MD;  Location: [place];  Service: Ophthalmology;  Laterality: Right;  . CORONARY ARTERY BYPASS GRAFT  03/17/1995   Wynonia Lawman; followed every six months.  Marland Kitchen HERNIA REPAIR    . LUMBAR LAMINECTOMY/DECOMPRESSION MICRODISCECTOMY N/A 07/30/2015   Procedure: LUMBAR LAMINECTOMY DISCECTOMY ;  Surgeon: Ashok Pall, MD;  Location: [place] [facility]URO ORS;  Service: Neurosurgery;  Laterality: N/A;  LUMBAR LAMINECTOMY DISCECTOMY   . LUMBAR LAMINECTOMY/DECOMPRESSION MICRODISCECTOMY N/A 09/06/2015   Procedure: Redo L3/4 Disectomy;  Surgeon: Ashok Pall, MD;  Location: [place] NEURO ORS;  Service: Neurosurgery;  Laterality: N/A;  . PROSTATE SURGERY     TURP at [place].  Marland Kitchen TRANSURETHRAL RESECTION OF PROSTATE       Medications:  Outpatient Encounter Medications as of 12/12/2018  Medication Sig Note  . albuterol (PROVENTIL HFA;VENTOLIN HFA) 108 (90 BASE) MCG/ACT inhaler Inhale 2 puffs into the lungs every 6 (six) hours as needed for wheezing or shortness of breath.   Marland Kitchen amiodarone (PACERONE) 200 MG tablet Take 100 mg by mouth daily.   Marland Kitchen amLODipine (NORVASC) 10 MG tablet TAKE 1 TABLET BY MOUTH EVERY DAY   . apixaban (ELIQUIS) 5 MG TABS tablet Take 5 mg by mouth 2 (two) times daily.   Marland Kitchen atorvastatin (LIPITOR) 20 MG tablet Take 20mg  (1 tab) 2 days a week (Patient taking differently: Take 20 mg by mouth See admin instructions. Take 20mg  (1 tab) 2 days a week on Mondays and Wednesdays)   . budesonide-formoterol (SYMBICORT) 160-4.5 MCG/ACT inhaler Inhale 2 puffs into the lungs 2 (two) times daily.   . calcitRIOL (ROCALTROL) 0.25 MCG  capsule Take 0.25 mcg by mouth every Monday, Wednesday, and Friday.   . cyanocobalamin 1000 MCG tablet Take 1,000 mcg by mouth daily.   Marland Kitchen denosumab (PROLIA) 60 MG/ML SOSY injection Inject 60 mg into the skin every 6 (six) months.   . ferrous sulfate 325 (65 FE) MG tablet Take 325 mg by mouth as directed. MWF   . finasteride (PROSCAR) 5 MG tablet Take 5 mg by mouth at bedtime.    . furosemide (LASIX) 40 MG tablet Take 1 tablet (40 mg total) by mouth 2 (two) times daily.   . hydrALAZINE (APRESOLINE) 25 MG tablet Take 25 mg by mouth 2 (two) times daily.   . insulin aspart protamine- aspart (NOVOLOG MIX 70/30) (70-30) 100 UNIT/ML injection Inject 15-35 Units into the skin See admin instructions. Take 15 units if sugar is 100 or take 35 units if sugar is above 270   . latanoprost (XALATAN) 0.005 %  ophthalmic solution Place 1 drop into both eyes at bedtime.   Marland Kitchen loratadine (CLARITIN) 10 MG tablet Take 10 mg by mouth daily.   . metoprolol tartrate (LOPRESSOR) 100 MG tablet Take 100 mg by mouth 2 (two) times daily.   . montelukast (SINGULAIR) 10 MG tablet Take 1 tablet (10 mg total) by mouth at bedtime.   . Multiple Vitamins-Minerals (PRESERVISION AREDS 2 PO) Take 2 tablets by mouth daily.    . mupirocin ointment (BACTROBAN) 2 % Place 1 application into the nose 2 (two) times daily.   . nitroGLYCERIN (NITROSTAT) 0.4 MG SL tablet Place 0.4 mg under the tongue every 5 (five) minutes as needed for chest pain.   Marland Kitchen omeprazole (PRILOSEC) 20 MG capsule Take 1 capsule (20 mg total) by mouth daily.   . ondansetron (ZOFRAN) 4 MG tablet Take 1 tablet (4 mg total) by mouth every 8 (eight) hours as needed for nausea or vomiting.   . pilocarpine (PILOCAR) 4 % ophthalmic solution Place 1 drop into both eyes 3 (three) times daily.    . predniSONE (DELTASONE) 5 MG tablet Take 5 mg by mouth daily with breakfast.    . SEMAGLUTIDE,0.25 OR 0.5MG /DOS, [place] Inject 1.5 mLs into the skin once a week. 0.5mg /0.358ml 11/01/2018: Take on  Mondays   . Tamsulosin HCl (FLOMAX) 0.4 MG CAPS Take 0.4 mg by mouth 2 (two) times daily.    . pregabalin (LYRICA) 75 MG capsule Take 75 mg by mouth 2 (two) times daily.     No facility-administered encounter medications on file as of 12/12/2018.     Allergies:  Allergies  Allergen Reactions  . Ace Inhibitors Other (See Comments)    Probably nausea and vomiting per patient   . Actonel [Risedronate] Nausea And Vomiting  . Ciprocinonide [Fluocinolone] Other (See Comments)    Probably nausea and vomiting per patient  . Flunisolide Other (See Comments)    Probably nausea and vomiting per patient   . Metformin And Related Other (See Comments)    Probably nausea and vomiting per patient   . Sertraline Other (See Comments)    Probably nausea and vomiting per patient   . Sulindac Other (See Comments)    Probably nausea and vomiting per patient   . Terazosin Other (See Comments)    Probably nausea and vomiting per patient     Family History: Family History  Problem Relation Age of Onset  . Hypertension Other   . Diabetes Brother   . Kidney failure Brother     Social History: Social History   Tobacco Use  . Smoking status: Former Smoker    Types: Cigarettes    Quit date: 03/16/1957    Years since quitting: 61.7  . Smokeless tobacco: Never Used  Substance Use Topics  . Alcohol use: No  . Drug use: No   Social History   Social History Narrative   Marital status: Married x 55 years.      Children: 3 children; 5 grandchildren; 6 gg.      Lives: with wife.        Employment: retired; [employer].  Working every other week; 45 hours.      Tobacco:  Quit at age 82.      Alcohol:  None      Exercise: none      ADLs: indepedent with ADLs;  Uses cane and has a walker.  Drives.      Advanced Directives:  +Living Will.  DNR/DNI.  Wife  is HCPOA.   Right hand   One story    Review of Systems:  CONSTITUTIONAL: No fevers, chills, night sweats, or weight  loss.   EYES: No visual changes or eye pain ENT: No hearing changes.  No history of nose bleeds.   RESPIRATORY: No cough, wheezing and shortness of breath.   CARDIOVASCULAR: Negative for chest pain, and palpitations.   GI: Negative for abdominal discomfort, blood in stools or black stools.  No recent change in bowel habits.   GU:  No history of incontinence.   MUSCLOSKELETAL: No history of joint pain or swelling.  No myalgias.   SKIN: Negative for lesions, rash, and itching.   HEMATOLOGY/ONCOLOGY: Negative for prolonged bleeding, bruising easily, and swollen nodes.  No history of cancer.   ENDOCRINE: Negative for cold or heat intolerance, polydipsia or goiter.   PSYCH:  No depression or anxiety symptoms.   NEURO: As Above.   Vital Signs:  BP 120/80   Pulse 78   Ht 5\' 11"  (1.803 m)   Wt 235 lb (106.6 kg)   SpO2 95%   BMI 32.78 kg/m    General Medical Exam:   General:  Tired-appearing, comfortable.   Eyes/ENT: see cranial nerve examination.   Neck:   No carotid bruits. Respiratory:  Clear to auscultation, good air entry bilaterally.   Cardiac:  Regular rate and rhythm, no murmur.   Extremities:  No deformities, 1+ pitting edema of the legs.  Skin:  No rashes or lesions.  Neurological Exam: MENTAL STATUS including orientation to time, place, person, recent and remote memory, attention span and concentration, language, and fund of knowledge is normal.  Speech is not dysarthric.  CRANIAL NERVES: II:  No visual field defects.  Unremarkable fundi.   III-IV-VI: Pupils equal round and reactive to light.  Normal conjugate, extra-ocular eye movements in all directions of gaze.  No nystagmus.  No ptosis.   V:  Normal facial sensation.    VII:  Normal facial symmetry and movements.   VIII:  Normal hearing and vestibular function.   IX-X:  Normal palatal movement.   XI:  Normal shoulder shrug and head rotation.   XII:  Normal tongue strength and range of motion, no deviation or  fasciculation.  MOTOR:  No atrophy, fasciculations or abnormal movements.  No pronator drift.   Upper Extremity:  Right  Left  Deltoid  5/5   5/5   Biceps  5/5   5/5   Triceps  5/5   5/5   Infraspinatus 5/5  5/5  Medial pectoralis 5/5  5/5  Wrist extensors  5/5   5/5   Wrist flexors  5/5   5/5   Finger extensors  5/5   5/5   Finger flexors  5/5   5/5   Dorsal interossei  5/5   5/5   Abductor pollicis  5/5   5/5   Tone (Ashworth scale)  0  0   Lower Extremity:  Right  Left  Hip flexors  5/5   5/5   Hip extensors  5/5   5/5   Adductor 5/5  5/5  Abductor 5/5  5/5  Knee flexors  5/5   5/5   Knee extensors  5/5   5/5   Dorsiflexors  4/5   5/5   Plantarflexors  5/5   5/5   Toe extensors  3+/5   4+/5   Toe flexors  5-/5   5-/5   Tone (Ashworth scale)  0  0  MSRs:  Right        Left                  brachioradialis 1+  1+  biceps 1+  1+  triceps 1+  1+  patellar 0  0  ankle jerk 0  0  Hoffman no  no  plantar response down  down   SENSORY: Vibration is intact at the MCP, reduced to 20% at the knees, and absent distal to ankles.  There is a gradient pattern of temperature and pinprick loss from the lower legs and into the feet.  Sensation in the hands is intact.   COORDINATION/GAIT: Normal finger-to- nose-finger.  Intact rapid alternating movements bilaterally.  Gait is assisted with walker, appears wide-based, slow, mild dragging of the right leg  IMPRESSION: Bilateral leg weakness contributed by lumbosacral degenerative changes and to a lesser extent diabetic neuropathy.  The asymmetric weakness in the right foot and increased pain in the right leg, is suggestive of possible L4-5 radiculopathy. I have requested a copy of his most recent MRI lumbar spine for my records He will return to the office for electrodiagnostic testing of the legs, keeping in mind that the results will most likely show overlap of lumbosacral radiculopathy and neuropathy and may not add to what is  already known.  I do not see any findings on his exam to suggest motor neuron disease or myopathy.  Further recommendations pending results.   Thank you for allowing me to participate in patient's care.  If I can answer any additional questions, I would be pleased to do so.    Sincerely,    Donika K. Posey Pronto, DO

## 2018-12-12 NOTE — Patient Instructions (Addendum)
Nerve testing of the legs  Please bring a copy of the MRI lumbar spine to your EMG appointment  You can resume taking all your medications

## 2018-12-15 ENCOUNTER — Ambulatory Visit (INDEPENDENT_AMBULATORY_CARE_PROVIDER_SITE_OTHER): Payer: No Typology Code available for payment source | Admitting: Neurology

## 2018-12-15 ENCOUNTER — Other Ambulatory Visit: Payer: Self-pay

## 2018-12-15 DIAGNOSIS — E1142 Type 2 diabetes mellitus with diabetic polyneuropathy: Secondary | ICD-10-CM | POA: Diagnosis not present

## 2018-12-15 DIAGNOSIS — M5416 Radiculopathy, lumbar region: Secondary | ICD-10-CM | POA: Diagnosis not present

## 2018-12-15 NOTE — Progress Notes (Signed)
Follow-up Visit   Date: 12/15/18   THAO VANOVER MRN: [id number] DOB: 27-Apr-1940   Interim History: Jacob [name] is a 78 y.o. right-handed male with hypertension, OSA, CAD s/p CABG, CKD, diabetes, COPD, polymyalgia rheumatica, anxiety, lumbar stenosis s/p surgery returning to the clinic for follow-up of bilateral leg weakness.  The patient was accompanied to the clinic by wife who also provides collateral information.    History of present illness: He underwent lumbar decompression at L3-4 on 07/30/2015 for and since this time, has developed increased numbness, pain, and weakness in the legs.  His initial surgery was complicated by early disciitis/osteomyelitis and he was treated with antibiotics and redo L3-4 discectomy on 09/10/2015.  He had extended rehab stab and ultimately worked his way back to ambulating with a walker from being wheelchair-bound.  He has ongoing imbalance and has been using a walker since his surgery.  He recently completed physical therapy for leg strengthening and balance training, however he denies any marked improvement.  He has fallen 3 times this year, all which have occurred when he was transferring.  He has difficulty with standing up from low chairs, transfers, and climbing stairs.  He is followed by Dr. Herma Mering for his back pain and has underwent a series of injections, without any improvement.  He had an MRI lumbar spine performed at the [place] earlier this year, however, I do not have this report or images to review personally.  He has diabetic neuropathy in the legs for the past 10 years.  He has numbness in the feet and lower legs.  His right leg has more weakness and pain, specifically sharp pain over the lateral lower leg.  He lives on a one-level home.  He used to drink heavily (12 beers daily + 2-3 shots of liquor) for 20 years and quit in 1987.  His diabetes is   UPDATE 12/15/2018: He is here for electrodiagnostic testing of the legs.  He also brings  his MRI from December 2019 which shows multilevel degenerative changes worse at L3-4 and L4-5 where there is moderate spinal canal narrowing and multilevel foraminal narrowing.  He is in a lot of discomfort today due to chronic back pain and appears in mild distress.  Medications:  Current Outpatient Medications on File Prior to Visit  Medication Sig Dispense Refill  . albuterol (PROVENTIL HFA;VENTOLIN HFA) 108 (90 BASE) MCG/ACT inhaler Inhale 2 puffs into the lungs every 6 (six) hours as needed for wheezing or shortness of breath. 1 Inhaler 2  . amiodarone (PACERONE) 200 MG tablet Take 100 mg by mouth daily.    Marland Kitchen amLODipine (NORVASC) 10 MG tablet TAKE 1 TABLET BY MOUTH EVERY DAY 90 tablet 1  . apixaban (ELIQUIS) 5 MG TABS tablet Take 5 mg by mouth 2 (two) times daily.    Marland Kitchen atorvastatin (LIPITOR) 20 MG tablet Take 20mg  (1 tab) 2 days a week (Patient taking differently: Take 20 mg by mouth See admin instructions. Take 20mg  (1 tab) 2 days a week on Mondays and Wednesdays) 32 tablet 3  . budesonide-formoterol (SYMBICORT) 160-4.5 MCG/ACT inhaler Inhale 2 puffs into the lungs 2 (two) times daily.    . calcitRIOL (ROCALTROL) 0.25 MCG capsule Take 0.25 mcg by mouth every Monday, Wednesday, and Friday.    . cyanocobalamin 1000 MCG tablet Take 1,000 mcg by mouth daily.    Marland Kitchen denosumab (PROLIA) 60 MG/ML SOSY injection Inject 60 mg into the skin every 6 (six) months.    Marland Kitchen  ferrous sulfate 325 (65 FE) MG tablet Take 325 mg by mouth as directed. MWF    . finasteride (PROSCAR) 5 MG tablet Take 5 mg by mouth at bedtime.     . furosemide (LASIX) 40 MG tablet Take 1 tablet (40 mg total) by mouth 2 (two) times daily. 180 tablet 1  . hydrALAZINE (APRESOLINE) 25 MG tablet Take 25 mg by mouth 2 (two) times daily.    . insulin aspart protamine- aspart (NOVOLOG MIX 70/30) (70-30) 100 UNIT/ML injection Inject 15-35 Units into the skin See admin instructions. Take 15 units if sugar is 100 or take 35 units if sugar is above  270    . latanoprost (XALATAN) 0.005 % ophthalmic solution Place 1 drop into both eyes at bedtime.    Marland Kitchen loratadine (CLARITIN) 10 MG tablet Take 10 mg by mouth daily.    . metoprolol tartrate (LOPRESSOR) 100 MG tablet Take 100 mg by mouth 2 (two) times daily.    . montelukast (SINGULAIR) 10 MG tablet Take 1 tablet (10 mg total) by mouth at bedtime. 90 tablet 3  . Multiple Vitamins-Minerals (PRESERVISION AREDS 2 PO) Take 2 tablets by mouth daily.     . mupirocin ointment (BACTROBAN) 2 % Place 1 application into the nose 2 (two) times daily. 22 g 0  . nitroGLYCERIN (NITROSTAT) 0.4 MG SL tablet Place 0.4 mg under the tongue every 5 (five) minutes as needed for chest pain.    Marland Kitchen omeprazole (PRILOSEC) 20 MG capsule Take 1 capsule (20 mg total) by mouth daily. 30 capsule 12  . ondansetron (ZOFRAN) 4 MG tablet Take 1 tablet (4 mg total) by mouth every 8 (eight) hours as needed for nausea or vomiting. 20 tablet 0  . pilocarpine (PILOCAR) 4 % ophthalmic solution Place 1 drop into both eyes 3 (three) times daily.     . predniSONE (DELTASONE) 5 MG tablet Take 5 mg by mouth daily with breakfast.     . pregabalin (LYRICA) 75 MG capsule Take 75 mg by mouth 2 (two) times daily.     Marland Kitchen SEMAGLUTIDE,0.25 OR 0.5MG /DOS, Adrian Inject 1.5 mLs into the skin once a week. 0.5mg /0.388ml    . Tamsulosin HCl (FLOMAX) 0.4 MG CAPS Take 0.4 mg by mouth 2 (two) times daily.      No current facility-administered medications on file prior to visit.     Allergies:  Allergies  Allergen Reactions  . Ace Inhibitors Other (See Comments)    Probably nausea and vomiting per patient   . Actonel [Risedronate] Nausea And Vomiting  . Ciprocinonide [Fluocinolone] Other (See Comments)    Probably nausea and vomiting per patient  . Flunisolide Other (See Comments)    Probably nausea and vomiting per patient   . Metformin And Related Other (See Comments)    Probably nausea and vomiting per patient   . Sertraline Other (See Comments)     Probably nausea and vomiting per patient   . Sulindac Other (See Comments)    Probably nausea and vomiting per patient   . Terazosin Other (See Comments)    Probably nausea and vomiting per patient     Review of Systems:  CONSTITUTIONAL: No fevers, chills, night sweats, or weight loss.  EYES: No visual changes or eye pain ENT: No hearing changes.  No history of nose bleeds.   RESPIRATORY: No cough, wheezing and shortness of breath.   CARDIOVASCULAR: Negative for chest pain, and palpitations.   GI: Negative for abdominal discomfort, blood in stools or  black stools.  No recent change in bowel habits.   GU:  No history of incontinence.   MUSCLOSKELETAL: ++history of joint pain or swelling.  No myalgias.   SKIN: Negative for lesions, rash, and itching.   ENDOCRINE: Negative for cold or heat intolerance, polydipsia or goiter.   PSYCH:  + depression or anxiety symptoms.   NEURO: As Above.   Vital Signs:  There were no vitals taken for this visit.     Neurological Exam: MENTAL STATUS including orientation to time, place, person, recent and remote memory, attention span and concentration, language, and fund of knowledge is normal.  Speech is not dysarthric.  CRANIAL NERVES:  No visual field defects.  Pupils equal round and reactive to light.  Normal conjugate, extra-ocular eye movements in all directions of gaze.  No ptosis.   MOTOR:  Motor strength is 5/5 the upper extremities and 4+/5 L dorsiflexion/toe extension, 4-/5 R dorsiflexion and 3/5 toe extension.  Moderate bilateral TA atrophy.  No fasciculations.  COORDINATION/GAIT: Gait appears slow, antalgic, unassisted with walker.    Data: MRI lumbar spine 03/04/2018: L1/L2: No focal disc herniation, canal stenosis, or neural foraminal  stenosis is identified. There is mild bilateral facet degenerative  changes.   L2/L3: There is disc desiccation with posterior disc bulge and mild  bilateral facet degenerative changes with  ligamentum flavum hypertrophy.  There is no significant spinal canal stenosis. There is partial effacement  of the right subarticular zone. There is no significant neural foraminal  stenosis.   L3/L4: Retrolisthesis of L3 on L4 is noted at this level. There is also  severe degenerative disc disease with severe disc height loss and  circumferential bulge. There is bilateral facet degenerative changes.  There is mild to moderate spinal canal narrowing. There is also narrowing  of the bilateral subarticular zones. There is moderate left and mild right  neural foraminal narrowing.   L4/L5: Moderate to severe degenerative disc disease with diffuse disc  bulge and bilateral facet degenerative changes with ligamentum flavum  hypertrophy. This results in mild-to-moderate spinal canal narrowing. In  addition there is narrowing of the bilateral subarticular zone. There is  also mild bilateral neural foraminal narrowing.   L5/S1: There is mild to moderate degenerative disc disease with disc  height loss, generalized disc bulge and bilateral facet degenerative  changes. There is no significant spinal canal narrowing. There is  narrowing of the bilateral subarticular zones. There is severe right and  moderate left neural foraminal narrowing.   1. Multilevel degenerative changes as detailed above. This is most  pronounced at L3-L4 and L4-L5 where there is mild to moderate spinal canal  narrowing. 2. Multilevel neural foraminal narrowing as above.  NCS/EMG of the legs 12/15/2018: 1. The electrophysiologic findings are consistent with a sensorimotor axonal polyneuropathy affecting the lower extremities. 2. There is evidence of a superimposed chronic L4-5 radiculopathy affecting bilateral lower extremities, moderate-to-severe and worse on the right.  IMPRESSION/PLAN: Bilateral leg weakness which is likely contributed by lumbosacral degenerative disease, especially notable at L4-5 causing bilateral  foot drop, worse on the right.  This is seen both on his imaging and electrodiagnostic testing.  Peripheral neuropathy is contributing to his distal weakness, however this is not the primary factor.  These findings were discussed with patient and his wife.  He will follow-up with his spine specialist for further recommendations.  Greater than 50% of this 15 minute visit was spent in counseling, explanation of diagnosis, planning of further management, and  coordination of care.   Thank you for allowing me to participate in patient's care.  If I can answer any additional questions, I would be pleased to do so.    Sincerely,     K. Posey Pronto, DO

## 2018-12-15 NOTE — Procedures (Signed)
[facility]  [place], [place]  [place], [place] [address] Tel: [phone] Fax:  [phone] Test Date:  12/15/2018  Patient: Jacob [name] DOB: 12-Jul-1940 Physician: Narda Amber, DO  Sex: Male Height: 5\' 11"  Ref Phys: Debby Bud, MD  ID#: [id number] Temp: 33.0C Technician:    Patient Complaints: This is a 78 year old man with history of neuropathy and lumbar surgery referred for evaluation of bilateral leg weakness.  NCV & EMG Findings: Extensive electrodiagnostic testing of the right lower extremity and additional studies of the left shows:  1. Bilateral sural and superficial peroneal sensory responses are absent. 2. Bilateral tibial and peroneal (EDB) motor responses are absent.  Bilateral peroneal motor responses at the tibialis anterior show reduced amplitudes. 3. Bilateral tibial H reflex studies are prolonged. 4. Chronic motor axonal loss changes are seen affecting the L4-5 arms bilaterally with sparse changes in bilateral medial gastrocnemius muscles.  There is no evidence of accompanied active denervation.  Impression: 1. The electrophysiologic findings are consistent with a sensorimotor axonal polyneuropathy affecting the lower extremities. 2. There is evidence of a superimposed chronic L4-5 radiculopathy affecting bilateral lower extremities, moderate-to-severe and worse on the right.   ___________________________ Narda Amber, DO    Nerve Conduction Studies Anti Sensory Summary Table   Site NR Peak (ms) Norm Peak (ms) P-T Amp (V) Norm P-T Amp  Left Sup Peroneal Anti Sensory (Ant Lat Mall)  33C  12 cm NR  <4.6  >3  Right Sup Peroneal Anti Sensory (Ant Lat Mall)  12 cm NR  <4.6  >3  Left Sural Anti Sensory (Lat Mall)  33C  Calf NR  <4.6  >3  Right Sural Anti Sensory (Lat Mall)  Calf NR  <4.6  >3   Motor Summary Table   Site NR Onset (ms) Norm Onset (ms) O-P Amp (mV) Norm O-P Amp Site1 Site2 Delta-0 (ms) Dist (cm) Vel (m/s) Norm Vel  (m/s)  Left Peroneal Motor (Ext Dig Brev)  33C  Ankle NR  <6.0  >2.5 B Fib Ankle  0.0  >40  B Fib NR     Poplt B Fib  0.0  >40  Poplt NR            Right Peroneal Motor (Ext Dig Brev)  33C  Ankle NR  <6.0  >2.5 B Fib Ankle  0.0  >40  B Fib NR     Poplt B Fib  0.0  >40  Poplt NR            Left Peroneal TA Motor (Tib Ant)  33C  Fib Head    4.0 <4.5 1.7 >3 Poplit Fib Head 1.4 7.0 50 >40  Poplit    5.4  1.6         Right Peroneal TA Motor (Tib Ant)  33C  Fib Head    3.3 <4.5 1.2 >3 Poplit Fib Head 1.3 7.0 54 >40  Poplit    4.6  1.1         Left Tibial Motor (Abd Hall Brev)  33C  Ankle NR  <6.0  >4 Knee Ankle  0.0  >40  Knee NR            Right Tibial Motor (Abd Hall Brev)  33C  Ankle NR  <6.0  >4 Knee Ankle  0.0  >40  Knee NR             H Reflex Studies   NR H-Lat (ms) Lat Norm (ms) L-R  H-Lat (ms)  Left Tibial (Gastroc)  33C     37.82 <35 0.00  Right Tibial (Gastroc)  33C     37.82 <35 0.00   EMG   Side Muscle Ins Act Fibs Psw Fasc Number Recrt Dur Dur. Amp Amp. Poly Poly. Comment  Right AntTibialis Nml Nml Nml Nml SMU Rapid All 1+ All 1+ All 1+ ATR  Right Gastroc Nml Nml Nml Nml 1- Rapid Few 1+ Few 1+ Few 1+ N/A  Right RectFemoris Nml Nml Nml Nml 1- Rapid Some 1+ Some 1+ Nml Nml N/A  Right GluteusMed Nml Nml Nml Nml 3- Rapid Most 1+ Most 1+ Many 1+ N/A  Right BicepsFemS Nml Nml Nml Nml Nml Nml Nml Nml Nml Nml Nml Nml N/A  Left AntTibialis Nml Nml Nml Nml 3- Rapid Many 1+ Many 1+ Many 1+ ATR  Left Gastroc Nml Nml Nml Nml Nml Nml Nml Nml Nml Nml Nml Nml N/A  Left RectFemoris Nml Nml Nml Nml 2- Rapid Some 1+ Some 1+ Some 1+ N/A      Waveforms:

## 2018-12-20 ENCOUNTER — Ambulatory Visit (INDEPENDENT_AMBULATORY_CARE_PROVIDER_SITE_OTHER): Payer: PPO | Admitting: Family Medicine

## 2018-12-20 ENCOUNTER — Other Ambulatory Visit: Payer: Self-pay

## 2018-12-20 ENCOUNTER — Encounter: Payer: Self-pay | Admitting: Family Medicine

## 2018-12-20 VITALS — BP 128/62 | HR 58 | Ht 71.0 in | Wt 230.0 lb

## 2018-12-20 DIAGNOSIS — N184 Chronic kidney disease, stage 4 (severe): Secondary | ICD-10-CM | POA: Diagnosis not present

## 2018-12-20 NOTE — Patient Instructions (Signed)
It was a pleasure to see you today.  I am glad that you are doing well!  Today we discussed your medication regimen.  You to continue taking your Lasix twice daily.  I am going to get some lab work today to take a look at your kidney function.  When I get those results back I will give you call and let you know if you need to continue taking the 2 pills for need to back off and on take 1.  Regarding refilling all of your prescriptions please let me know what I can do so that we can get the packet prescriptions taking care of.  If I need to fill them all at once and happy to do so.  I will need you to make sure you let me know if your cardiology or other healthcare providers change any of your medications so that I can make those changes appropriately.

## 2018-12-20 NOTE — Progress Notes (Signed)
    Subjective:  Jacob [name] is a 78 y.o. male who presents to the [facility] today for a follow-up visit regarding his kidney function as well as to discuss medication management.  HPI: Patient has been doing well on currnet medications and denies SOB, CP. He reports increased urination due to the lasix but it is not unmanagable. Patient is concernde about his kidney function and want to know if he can decrease lasix back to 1 dose daily.  Patient asking if I can prescribe those medications for him so that he can use the packs rather than individual bottles because he is having issues organizing all of his medications  Objective:  Physical Exam: BP 128/62   Pulse (!) 58   Ht 5\' 11"  (1.803 m)   Wt 104.3 kg   SpO2 97%   BMI 32.08 kg/m   Gen: NAD, resting comfortably CV: RRR with no murmurs appreciated Pulm: NWOB, CTAB with no crackles, wheezes, or rhonchi GI: Normal bowel sounds present. Soft, Nontender, Nondistended. MSK: no edema, cyanosis, or clubbing noted Skin: warm, dry Neuro: grossly normal, moves all extremities Psych: Normal affect and thought content  Results for orders placed or performed in visit on 12/20/18 (from the past 72 hour(s))  Basic metabolic panel     Status: Abnormal   Collection Time: 12/20/18  2:48 PM  Result Value Ref Range   Glucose 119 (H) 65 - 99 mg/dL   BUN 38 (H) 8 - 27 mg/dL   Creatinine, Ser 2.97 (H) 0.76 - 1.27 mg/dL   GFR calc non Af Amer 19 (L) >59 mL/min/1.73   GFR calc Af Amer 22 (L) >59 mL/min/1.73   BUN/Creatinine Ratio 13 10 - 24   Sodium 142 134 - 144 mmol/L   Potassium 3.7 3.5 - 5.2 mmol/L   Chloride 99 96 - 106 mmol/L   CO2 29 20 - 29 mmol/L   Calcium 9.7 8.6 - 10.2 mg/dL     Assessment/Plan:  CKD (chronic kidney disease) Patient only knows he can decrease his Lasix to 1 dose daily rather than 2.  Patient reports he is urinating well but not excessively, says that his edema has decreased. -We will check BMP today to evaluate  creatinine - We will notify patient if he needs to decrease or not.  I am comfortable keeping his Lasix where it is because he seems to be doing well on this dose.    Lab Orders     Basic metabolic panel  No orders of the defined types were placed in this encounter.     Gifford Shave, MD  PGY-1, [facility]  12/22/18 6:46 AM

## 2018-12-21 LAB — BASIC METABOLIC PANEL
BUN/Creatinine Ratio: 13 (ref 10–24)
BUN: 38 mg/dL — ABNORMAL HIGH (ref 8–27)
CO2: 29 mmol/L (ref 20–29)
Calcium: 9.7 mg/dL (ref 8.6–10.2)
Chloride: 99 mmol/L (ref 96–106)
Creatinine, Ser: 2.97 mg/dL — ABNORMAL HIGH (ref 0.76–1.27)
GFR calc Af Amer: 22 mL/min/{1.73_m2} — ABNORMAL LOW (ref >59)
GFR calc non Af Amer: 19 mL/min/{1.73_m2} — ABNORMAL LOW (ref >59)
Glucose: 119 mg/dL — ABNORMAL HIGH (ref 65–99)
Potassium: 3.7 mmol/L (ref 3.5–5.2)
Sodium: 142 mmol/L (ref 134–144)

## 2018-12-22 NOTE — Assessment & Plan Note (Signed)
Patient only knows he can decrease his Lasix to 1 dose daily rather than 2.  Patient reports he is urinating well but not excessively, says that his edema has decreased. -We will check BMP today to evaluate creatinine - We will notify patient if he needs to decrease or not.  I am comfortable keeping his Lasix where it is because he seems to be doing well on this dose.

## 2018-12-23 ENCOUNTER — Telehealth: Payer: Self-pay | Admitting: Neurology

## 2018-12-23 ENCOUNTER — Telehealth: Payer: Self-pay | Admitting: *Deleted

## 2018-12-23 NOTE — Telephone Encounter (Signed)
Spoke with patient's wife and informed her that the patient's kidney function was stable and I would like him to continue on his Lasix twice daily.

## 2018-12-23 NOTE — Telephone Encounter (Signed)
Faxed notes to [place] [phone]

## 2018-12-23 NOTE — Telephone Encounter (Signed)
[facility] called needing patient's note from 12/12/18 faxed to them at [phone]. Thanks

## 2018-12-23 NOTE — Telephone Encounter (Signed)
Wife calls and would like to know if they should decrease his furosemide or not based on his labs. To PCP. Christen Bame, CMA

## 2018-12-29 ENCOUNTER — Telehealth: Payer: Self-pay | Admitting: Cardiology

## 2018-12-29 NOTE — Telephone Encounter (Signed)
Patients pulmonologist recommends he come off amiodarone because it is affecting his lungs. Please advise.

## 2018-12-30 NOTE — Telephone Encounter (Signed)
Please have them schedule with someone in the office next week.  Discussed also evaluate the patient, even if we will get a change in medication we can do some education at that time as well.

## 2018-12-30 NOTE — Telephone Encounter (Signed)
Stop amiodarone pending office fu

## 2018-12-30 NOTE — Telephone Encounter (Signed)
Called patient's wife. Got patient scheduled to see Dr. Bettina Gavia in [facility] on Tuesday 01/03/2019. Advised her to have the patient stop amiodarone and see Dr. Bettina Gavia at the appointment next week per Dr. Bettina Gavia. She verbally understood. No further questions.

## 2018-12-30 NOTE — Telephone Encounter (Signed)
Called and spoke to patient's wife. She reports the patient's pulmonologist reports the patient's breathing is getting worse and believes it is from amiodarone 100 mg daily. The wife would like to know if the patient can be switched to something else that won't affect his breathing negatively. She would like a answer as soon as we can let her know. Will consult with DOD since Dr. Bettina Gavia is out.

## 2019-01-03 ENCOUNTER — Other Ambulatory Visit: Payer: Self-pay

## 2019-01-03 ENCOUNTER — Encounter: Payer: Self-pay | Admitting: Cardiology

## 2019-01-03 ENCOUNTER — Ambulatory Visit (INDEPENDENT_AMBULATORY_CARE_PROVIDER_SITE_OTHER): Payer: HMO | Admitting: Cardiology

## 2019-01-03 VITALS — BP 116/68 | HR 72 | Ht 71.0 in | Wt 228.1 lb

## 2019-01-03 DIAGNOSIS — Z79899 Other long term (current) drug therapy: Secondary | ICD-10-CM

## 2019-01-03 DIAGNOSIS — I5032 Chronic diastolic (congestive) heart failure: Secondary | ICD-10-CM

## 2019-01-03 DIAGNOSIS — Z7901 Long term (current) use of anticoagulants: Secondary | ICD-10-CM | POA: Diagnosis not present

## 2019-01-03 DIAGNOSIS — I480 Paroxysmal atrial fibrillation: Secondary | ICD-10-CM | POA: Diagnosis not present

## 2019-01-03 DIAGNOSIS — N184 Chronic kidney disease, stage 4 (severe): Secondary | ICD-10-CM

## 2019-01-03 DIAGNOSIS — I110 Hypertensive heart disease with heart failure: Secondary | ICD-10-CM | POA: Diagnosis not present

## 2019-01-03 NOTE — Patient Instructions (Signed)
Medication Instructions:  Your physician has recommended you make the following change in your medication:   STOP amiodarone  *If you need a refill on your cardiac medications before your next appointment, please call your pharmacy*  Lab Work: None  If you have labs (blood work) drawn today and your tests are completely normal, you will receive your results only by: Marland Kitchen MyChart Message (if you have MyChart) OR . A paper copy in the mail If you have any lab test that is abnormal or we need to change your treatment, we will call you to review the results.  Testing/Procedures: You had an EKG today.   Follow-Up: At [facility], you and your health needs are our priority.  As part of our continuing mission to provide you with exceptional heart care, we have created designated Provider Care Teams.  These Care Teams include your primary Cardiologist (physician) and Advanced Practice Providers (APPs -  Physician Assistants and Nurse Practitioners) who all work together to provide you with the care you need, when you need it.  Your next appointment:   6 weeks  The format for your next appointment:   In Person  Provider:   Shirlee More, MD or Laurann Montana, NP  Other Instructions **Check your heart rate/pulse daily and call our office if your heart rate is greater than 100 or if your BP monitor notifies you of atrial fibrillation.

## 2019-01-03 NOTE — Progress Notes (Signed)
Cardiology Office Note:    Date:  01/03/2019   ID:  Jacob [name], DOB 1940/05/28, MRN [id number]  PCP:  Gifford Shave, MD  Cardiologist:  Shirlee More, MD    Referring MD: Gifford Shave, MD    ASSESSMENT:    1. On amiodarone therapy   2. Paroxysmal atrial fibrillation (HCC)   3. Chronic anticoagulation   4. Hypertensive heart disease with chronic diastolic congestive heart failure (HCC)   5. Stage 4 chronic kidney disease ([place])    PLAN:    In order of problems listed above:  1. Discontinued concern of pulmonary toxicity CT pending regardless results I would restart the drug 2. Stable, he is maintaining sinus rhythm if follow-up in 6 weeks and monitor his heart rate at home and call me if he gets a digital alarm on his blood pressure cuff for rapid rates 3. Continue his coagulant high risk of recurrent atrial fibrillation 4. Difficult to judge with his kidney disease multiple drugs causing edema lung disease but I think his heart failure is compensated I would not increase diuretics with his stage IV CKD   Next appointment: 6 weeks   Medication Adjustments/Labs and Tests Ordered: Current medicines are reviewed at length with the patient today.  Concerns regarding medicines are outlined above.  No orders of the defined types were placed in this encounter.  No orders of the defined types were placed in this encounter.   Chief Complaint  Patient presents with  . Follow-up  . Atrial Fibrillation    Amiodarone discontinued    History of Present Illness:    Jacob [name] is a 78 y.o. male with a hx of CAD s/p CABGx7 1997, DM2, HTN, HF, PAF on amiodarone & chronic anticoagulation, CKD, COPD, OSA  last seen 08/23/2018.  There was a call to our office 12/29/2018 the patient's wife related that his pulmonologist recommended discontinuing amiodarone he stopped same day and is now seen in follow-up regarding his atrial fibrillation Compliance with diet, lifestyle and  medications: Yes  Wife handcarried a note from the [place] hospital that he has restriction on PFTs advised to have amiodarone discontinued.  He has not noticed any palpitations since then he has been taking Lyrica and has more edema and its been weaned hopefully discontinued.  Chronic shortness of breath unchanged he has minimal cough he does not have a rapid progression of pulmonary deterioration however at times amiodarone presents insidiously with lung toxicity.  CT was performed today chest results unavailable.  No orthopnea chest pain palpitation or syncope he remains anticoagulated.  CXR 11/01/2018: FINDINGS: Prior CABG. Cardiomegaly. Calcified granuloma at the right lung base. Small left pleural effusion with left base atelectasis again noted, unchanged. No acute bony abnormality. IMPRESSION: Cardiomegaly. Small left pleural effusion with left base atelectasis.  Past Medical History:  Diagnosis Date  . Allergy   . Anemia   . Anxiety   . Arthritis   . Asthma   . BPH (benign prostatic hyperplasia)   . Cancer of kidney ([place])   . Cataract   . CHF (congestive heart failure) ([place])   . Chronic kidney disease    chronic  kidney failure  kidney function at 42%  . COPD (chronic obstructive pulmonary disease) ([place])   . Coronary artery disease    CABG  7 bypasses  . Diabetes mellitus without complication ([place])   . Facial cellulitis   . GERD (gastroesophageal reflux disease)   . Gout   . Hepatitis  many years ago  . Hyperlipidemia   . Hypertension   . Lumbar disc disease   . Obesity   . Paroxysmal atrial fibrillation (HCC)   . Peripheral neuropathy   . Polymyalgia rheumatica (HCC)    maintained on Prednisone, Plaquenil. Followed by rhuematology every 4 months/[name].  . Shortness of breath dyspnea    with exertion  . Sleep apnea    CPAP   Trying to use    Past Surgical History:  Procedure Laterality Date  . APPENDECTOMY    . CARDIAC CATHETERIZATION  1998, 2000   left  .  CATARACT EXTRACTION W/PHACO Right 11/28/2014   Procedure: CATARACT EXTRACTION PHACO AND INTRAOCULAR LENS PLACEMENT ([place]) RIGHT ;  Surgeon: Marylynn Pearson, MD;  Location: [facility];  Service: Ophthalmology;  Laterality: Right;  . CORONARY ARTERY BYPASS GRAFT  03/17/1995   Wynonia Lawman; followed every six months.  Marland Kitchen HERNIA REPAIR    . LUMBAR LAMINECTOMY/DECOMPRESSION MICRODISCECTOMY N/A 07/30/2015   Procedure: LUMBAR LAMINECTOMY DISCECTOMY ;  Surgeon: Ashok Pall, MD;  Location: [facility];  Service: Neurosurgery;  Laterality: N/A;  LUMBAR LAMINECTOMY DISCECTOMY   . LUMBAR LAMINECTOMY/DECOMPRESSION MICRODISCECTOMY N/A 09/06/2015   Procedure: Redo L3/4 Disectomy;  Surgeon: Ashok Pall, MD;  Location: [facility];  Service: Neurosurgery;  Laterality: N/A;  . PROSTATE SURGERY     TURP at [place].  Marland Kitchen TRANSURETHRAL RESECTION OF PROSTATE      Current Medications: Current Meds  Medication Sig  . amLODipine (NORVASC) 10 MG tablet TAKE 1 TABLET BY MOUTH EVERY DAY  . apixaban (ELIQUIS) 5 MG TABS tablet Take 5 mg by mouth 2 (two) times daily.  Marland Kitchen atorvastatin (LIPITOR) 20 MG tablet Take 20mg  (1 tab) 2 days a week (Patient taking differently: Take 20 mg by mouth See admin instructions. Take 20mg  (1 tab) 2 days a week on Mondays and Wednesdays)  . budesonide-formoterol (SYMBICORT) 160-4.5 MCG/ACT inhaler Inhale 2 puffs into the lungs 2 (two) times daily.  . calcitRIOL (ROCALTROL) 0.25 MCG capsule Take 0.25 mcg by mouth every Monday, Wednesday, and Friday.  . cyanocobalamin 1000 MCG tablet Take 1,000 mcg by mouth daily.  Marland Kitchen denosumab (PROLIA) 60 MG/ML SOSY injection Inject 60 mg into the skin every 6 (six) months.  . ferrous sulfate 325 (65 FE) MG tablet Take 325 mg by mouth as directed. MWF  . finasteride (PROSCAR) 5 MG tablet Take 5 mg by mouth at bedtime.   . furosemide (LASIX) 40 MG tablet Take 1 tablet (40 mg total) by mouth 2 (two) times daily.  . hydrALAZINE (APRESOLINE) 25 MG tablet Take 25 mg by mouth 2 (two) times  daily.  . insulin lispro protamine-lispro (HUMALOG 75/25 MIX) (75-25) 100 UNIT/ML SUSP injection Inject into the skin 2 (two) times daily with a meal. See sliding scale instructions.  Marland Kitchen latanoprost (XALATAN) 0.005 % ophthalmic solution Place 1 drop into both eyes at bedtime.  Marland Kitchen loratadine (CLARITIN) 10 MG tablet Take 10 mg by mouth daily.  . metoprolol tartrate (LOPRESSOR) 100 MG tablet Take 100 mg by mouth 2 (two) times daily.  . montelukast (SINGULAIR) 10 MG tablet Take 1 tablet (10 mg total) by mouth at bedtime.  . Multiple Vitamins-Minerals (PRESERVISION AREDS 2 PO) Take 2 tablets by mouth daily.   . nitroGLYCERIN (NITROSTAT) 0.4 MG SL tablet Place 0.4 mg under the tongue every 5 (five) minutes as needed for chest pain.  . pilocarpine (PILOCAR) 4 % ophthalmic solution Place 1 drop into both eyes 3 (three) times daily.   Marland Kitchen  predniSONE (DELTASONE) 5 MG tablet Take 5 mg by mouth daily with breakfast.   . pregabalin (LYRICA) 75 MG capsule Take 75 mg by mouth 2 (two) times daily.   Marland Kitchen SEMAGLUTIDE,0.25 OR 0.5MG /DOS, [place] Inject 1.5 mLs into the skin once a week. 0.5mg /0.370ml  . Tamsulosin HCl (FLOMAX) 0.4 MG CAPS Take 0.4 mg by mouth 2 (two) times daily.      Allergies:   Ace inhibitors, Actonel [risedronate], Ciprocinonide [fluocinolone], Flunisolide, Metformin and related, Sertraline, Sulindac, and Terazosin   Social History   Socioeconomic History  . Marital status: Married    Spouse name: Not on file  . Number of children: Not on file  . Years of education: Not on file  . Highest education level: Not on file  Occupational History  . Occupation: Diplomatic Services operational officer  . Financial resource strain: Not on file  . Food insecurity    Worry: Not on file    Inability: Not on file  . Transportation needs    Medical: Not on file    Non-medical: Not on file  Tobacco Use  . Smoking status: Former Smoker    Types: Cigarettes    Quit date: 03/16/1957    Years since quitting: 61.8  . Smokeless  tobacco: Never Used  Substance and Sexual Activity  . Alcohol use: No  . Drug use: No  . Sexual activity: Not Currently    Birth control/protection: None  Lifestyle  . Physical activity    Days per week: Not on file    Minutes per session: Not on file  . Stress: Not on file  Relationships  . Social Herbalist on phone: Not on file    Gets together: Not on file    Attends religious service: Not on file    Active member of club or organization: Not on file    Attends meetings of clubs or organizations: Not on file    Relationship status: Not on file  Other Topics Concern  . Not on file  Social History Narrative   Marital status: Married x 55 years.      Children: 3 children; 5 grandchildren; 6 gg.      Lives: with wife.        Employment: retired; [employer].  Working every other week; 45 hours.      Tobacco:  Quit at age 40.      Alcohol:  None      Exercise: none      ADLs: indepedent with ADLs;  Uses cane and has a walker.  Drives.      Advanced Directives:  +Living Will.  DNR/DNI.  Wife is HCPOA.   Right hand   One story     Family History: The patient's family history includes Diabetes in his brother; Hypertension in an other family member; Kidney failure in his brother. ROS:   Please see the history of present illness.    All other systems reviewed and are negative.  EKGs/Labs/Other Studies Reviewed:    The following studies were reviewed today:  EKG:  EKG ordered today and personally reviewed.  The ekg ordered today demonstrates sinus rhythm first-degree AV block right bundle branch block  Recent Labs:  11/14/2018:  Creatinine 3.26 GFR 17 cc/min potassium 4.3 08/23/2018: ALT 20; NT-Pro BNP 3,661; TSH 5.850 11/01/2018: B Natriuretic Peptide 1,675.9 11/03/2018: Hemoglobin 9.7; Platelets 164 12/20/2018: BUN 38; Creatinine, Ser 2.97; Potassium 3.7; Sodium 142  Recent Lipid Panel  Component Value Date/Time   CHOL 167 08/23/2018  1058   TRIG 104 08/23/2018 1058   HDL 52 08/23/2018 1058   CHOLHDL 3.2 08/23/2018 1058   CHOLHDL 4.1 01/24/2014 1425   VLDL 23 01/24/2014 1425   LDLCALC 94 08/23/2018 1058    Physical Exam:    VS:  BP 116/68 (BP Location: Right Arm, Patient Position: Sitting, Cuff Size: Large)   Pulse 72   Ht 5\' 11"  (1.803 m)   Wt 228 lb 1.9 oz (103.5 kg)   SpO2 94%   BMI 31.82 kg/m     Wt Readings from Last 3 Encounters:  01/03/19 228 lb 1.9 oz (103.5 kg)  12/20/18 230 lb (104.3 kg)  12/12/18 235 lb (106.6 kg)     GEN: Looks very sedated chronically ill well nourished, well developed in no acute distress HEENT: Normal NECK: No JVD; No carotid bruits LYMPHATICS: No lymphadenopathy CARDIAC: RRR, no murmurs, rubs, gallops RESPIRATORY:  Clear to auscultation without rales, wheezing or rhonchi  ABDOMEN: Soft, non-tender, non-distended MUSCULOSKELETAL: He has bilateral lower extremity doughy edema with calcium channel blocker and Lyrica edema; No deformity  SKIN: Warm and dry NEUROLOGIC:  Alert and oriented x 3 PSYCHIATRIC:  Normal affect    Signed, Shirlee More, MD  01/03/2019 4:31 PM    [facility]

## 2019-01-03 NOTE — Addendum Note (Signed)
Addended by: Stevan Born on: 01/03/2019 04:42 PM   Modules accepted: Orders

## 2019-01-07 ENCOUNTER — Other Ambulatory Visit: Payer: Self-pay

## 2019-01-07 ENCOUNTER — Encounter (HOSPITAL_COMMUNITY): Payer: Self-pay | Admitting: Emergency Medicine

## 2019-01-07 ENCOUNTER — Emergency Department (HOSPITAL_COMMUNITY)
Admission: EM | Admit: 2019-01-07 | Discharge: 2019-01-08 | Disposition: A | Discharge status: Home / Self Care | Payer: No Typology Code available for payment source | Attending: Emergency Medicine | Admitting: Emergency Medicine

## 2019-01-07 DIAGNOSIS — Z79899 Other long term (current) drug therapy: Secondary | ICD-10-CM | POA: Insufficient documentation

## 2019-01-07 DIAGNOSIS — Z794 Long term (current) use of insulin: Secondary | ICD-10-CM | POA: Diagnosis not present

## 2019-01-07 DIAGNOSIS — M25551 Pain in right hip: Secondary | ICD-10-CM | POA: Insufficient documentation

## 2019-01-07 DIAGNOSIS — I130 Hypertensive heart and chronic kidney disease with heart failure and stage 1 through stage 4 chronic kidney disease, or unspecified chronic kidney disease: Secondary | ICD-10-CM | POA: Insufficient documentation

## 2019-01-07 DIAGNOSIS — M545 Low back pain, unspecified: Secondary | ICD-10-CM

## 2019-01-07 DIAGNOSIS — E1122 Type 2 diabetes mellitus with diabetic chronic kidney disease: Secondary | ICD-10-CM | POA: Diagnosis not present

## 2019-01-07 DIAGNOSIS — G8929 Other chronic pain: Secondary | ICD-10-CM | POA: Diagnosis not present

## 2019-01-07 DIAGNOSIS — Z87891 Personal history of nicotine dependence: Secondary | ICD-10-CM | POA: Insufficient documentation

## 2019-01-07 DIAGNOSIS — I259 Chronic ischemic heart disease, unspecified: Secondary | ICD-10-CM | POA: Diagnosis not present

## 2019-01-07 DIAGNOSIS — N189 Chronic kidney disease, unspecified: Secondary | ICD-10-CM | POA: Insufficient documentation

## 2019-01-07 DIAGNOSIS — J45909 Unspecified asthma, uncomplicated: Secondary | ICD-10-CM | POA: Insufficient documentation

## 2019-01-07 DIAGNOSIS — I480 Paroxysmal atrial fibrillation: Secondary | ICD-10-CM | POA: Insufficient documentation

## 2019-01-07 DIAGNOSIS — Z7901 Long term (current) use of anticoagulants: Secondary | ICD-10-CM | POA: Insufficient documentation

## 2019-01-07 DIAGNOSIS — S79911A Unspecified injury of right hip, initial encounter: Secondary | ICD-10-CM | POA: Diagnosis not present

## 2019-01-07 DIAGNOSIS — I509 Heart failure, unspecified: Secondary | ICD-10-CM | POA: Diagnosis not present

## 2019-01-07 NOTE — ED Triage Notes (Signed)
Pt c/o back pain that radiates down both legs. Pt reports pain has been constant since doctor took him off his gabapentin.

## 2019-01-08 ENCOUNTER — Emergency Department (HOSPITAL_COMMUNITY): Payer: No Typology Code available for payment source

## 2019-01-08 DIAGNOSIS — S79911A Unspecified injury of right hip, initial encounter: Secondary | ICD-10-CM | POA: Diagnosis not present

## 2019-01-08 DIAGNOSIS — M25551 Pain in right hip: Secondary | ICD-10-CM | POA: Diagnosis not present

## 2019-01-08 DIAGNOSIS — M545 Low back pain: Secondary | ICD-10-CM | POA: Diagnosis not present

## 2019-01-08 LAB — URINALYSIS, ROUTINE W REFLEX MICROSCOPIC
Bacteria, UA: NONE SEEN
Bilirubin Urine: NEGATIVE
Glucose, UA: NEGATIVE mg/dL
Hgb urine dipstick: NEGATIVE
Ketones, ur: NEGATIVE mg/dL
Leukocytes,Ua: NEGATIVE
Nitrite: NEGATIVE
Protein, ur: >=300 mg/dL — AB
Specific Gravity, Urine: 1.013 (ref 1.005–1.030)
pH: 5 (ref 5.0–8.0)

## 2019-01-08 MED ORDER — METHOCARBAMOL 500 MG PO TABS
500.0000 mg | ORAL_TABLET | Freq: Once | ORAL | Status: AC
  Administered 2019-01-08: 500 mg via ORAL
  Filled 2019-01-08: qty 1

## 2019-01-08 MED ORDER — ONDANSETRON 4 MG PO TBDP: 4.0000 mg | ORAL_TABLET | Freq: Four times a day (QID) | ORAL | 0 refills | Status: DC | PRN

## 2019-01-08 MED ORDER — ONDANSETRON 4 MG PO TBDP
4.0000 mg | ORAL_TABLET | Freq: Once | ORAL | Status: AC
  Administered 2019-01-08: 4 mg via ORAL
  Filled 2019-01-08: qty 1

## 2019-01-08 MED ORDER — METHOCARBAMOL 500 MG PO TABS: 500.0000 mg | ORAL_TABLET | Freq: Three times a day (TID) | ORAL | 0 refills | Status: DC | PRN

## 2019-01-08 MED ORDER — MORPHINE SULFATE (PF) 4 MG/ML IV SOLN
4.0000 mg | Freq: Once | INTRAVENOUS | Status: AC
  Administered 2019-01-08: 4 mg via INTRAMUSCULAR
  Filled 2019-01-08: qty 1

## 2019-01-08 NOTE — Discharge Instructions (Addendum)
Please continue your Percocet as prescribed.  I recommend that you follow-up closely with your primary care physician and pain management specialist.  Please take your Percocet as prescribed.  If you develop worsening pain that is uncontrolled with your medications at home, new numbness, worsening weakness, bowel or bladder incontinence, are unable to empty your bladder or have a fever of 100.4 or higher, please return to the emergency department.

## 2019-01-08 NOTE — ED Notes (Signed)
Patient transported to X-ray 

## 2019-01-08 NOTE — ED Notes (Signed)
Pt was ambulated in hallway w/ walker. Pt tolerated walk well with no signs of dizziness, SOB, or exertion.

## 2019-01-08 NOTE — ED Notes (Signed)
Patient verbalizes understanding of discharge instructions. Opportunity for questioning and answers were provided. Armband removed by staff, pt discharged from ED.  

## 2019-01-08 NOTE — ED Provider Notes (Signed)
TIME SEEN: 1:52 AM  CHIEF COMPLAINT: Back pain  HPI: Patient is a 78 year old male with history of chronic back pain, CHF, COPD, CAD status post CABG, hypertension and hyperlipidemia who presents to the ED with increasing back pain over the past 2 weeks with radiation of pain down the right leg.  Also having some pain in the right anterior thigh, groin.  No flank pain, dysuria, hematuria, urgency or frequency.  He is able to bear weight on the right leg but is now having to use a walker due to pain.  States he has had a history of previous kidney infections.  No history of kidney stones.  Patient has had 2 lumbar back surgeries in 2017 -L3-L4 lumbar decompression on 07/30/2015 and redo L3-L4 discectomy on 09/10/2015.  First surgery was complicated by early discitis/osteomyelitis.  Last epidural injection with Dr. Nelva Bush 2-3 months ago.  Gets percocet from Dr. Nelva Bush, his pain doctor.  Has medication at home.  But doesn't feel oxycodone is helping.  Gets refill in 4 days.    Pain goes down right leg.  Usually gabapentin helps.  Cardiologist, Dr. Bettina Gavia, stopped this medication one week ago because wife reported it was causing confusion.  Has chronic neuropathy of bilateral lower extremities that is unchanged.  No new numbness or tingling.  No new weakness.  States his right leg has given out on him twice and he has had 2 falls.  Last fall was 2 weeks ago.  States he fell to his knees and did not strike his head.  He denies bowel or bladder incontinence.  No fever or chills.  He feels like both of his legs are weak but this is also chronic and unchanged.  Also followed by Dr. Posey Pronto with [facility].  Last MRI was December 2019 at the [facility] that showed multilevel degenerative changes worse at L3-L4 and L4-L5 where there is moderate spinal canal narrowing and multilevel foraminal narrowing.  PCP is with [facility].  He has not scheduled an appointment or made them aware of his worsening symptoms.     Arrives to ED by ambulance.  MRI lumbar spine 03/04/2018: L1/L2: No focal disc herniation, canal stenosis, or neural foraminal  stenosis is identified. There is mild bilateral facet degenerative  changes.   L2/L3: There is disc desiccation with posterior disc bulge and mild  bilateral facet degenerative changes with ligamentum flavum hypertrophy.  There is no significant spinal canal stenosis. There is partial effacement  of the right subarticular zone. There is no significant neural foraminal  stenosis.   L3/L4: Retrolisthesis of L3 on L4 is noted at this level. There is also  severe degenerative disc disease with severe disc height loss and  circumferential bulge. There is bilateral facet degenerative changes.  There is mild to moderate spinal canal narrowing. There is also narrowing  of the bilateral subarticular zones. There is moderate left and mild right  neural foraminal narrowing.   L4/L5: Moderate to severe degenerative disc disease with diffuse disc  bulge and bilateral facet degenerative changes with ligamentum flavum  hypertrophy. This results in mild-to-moderate spinal canal narrowing. In  addition there is narrowing of the bilateral subarticular zone. There is  also mild bilateral neural foraminal narrowing.   L5/S1: There is mild to moderate degenerative disc disease with disc  height loss, generalized disc bulge and bilateral facet degenerative  changes. There is no significant spinal canal narrowing. There is  narrowing of the bilateral subarticular zones. There is severe right  and  moderate left neural foraminal narrowing.   1. Multilevel degenerative changes as detailed above. This is most  pronounced at L3-L4 and L4-L5 where there is mild to moderate spinal canal  narrowing. 2. Multilevel neural foraminal narrowing as above.  NCS/EMG of the legs 12/15/2018: 1. The electrophysiologic findings are consistent with a sensorimotor axonal polyneuropathy  affecting the lower extremities. 2. There is evidence of a superimposed chronic L4-5 radiculopathy affecting bilateral lower extremities, moderate-to-severe and worse on the right.  ROS: See HPI Constitutional: no fever  Eyes: no drainage  ENT: no runny nose   Cardiovascular:  no chest pain  Resp: no SOB  GI: no vomiting GU: no dysuria Integumentary: no rash  Allergy: no hives  Musculoskeletal: no leg swelling  Neurological: no slurred speech ROS otherwise negative  PAST MEDICAL HISTORY/PAST SURGICAL HISTORY:  Past Medical History:  Diagnosis Date  . Allergy   . Anemia   . Anxiety   . Arthritis   . Asthma   . BPH (benign prostatic hyperplasia)   . Cancer of kidney ([place])   . Cataract   . CHF (congestive heart failure) ([place])   . Chronic kidney disease    chronic  kidney failure  kidney function at 42%  . COPD (chronic obstructive pulmonary disease) ([place])   . Coronary artery disease    CABG  7 bypasses  . Diabetes mellitus without complication ([place])   . Facial cellulitis   . GERD (gastroesophageal reflux disease)   . Gout   . Hepatitis    many years ago  . Hyperlipidemia   . Hypertension   . Lumbar disc disease   . Obesity   . Paroxysmal atrial fibrillation (H[facility])   . Peripheral neuropathy   . Polymyalgia rheumatica (H[facility])    maintained on Prednisone, Plaquenil. Followed by rhuematology every 4 months/[name].  . Shortness of breath dyspnea    with exertion  . Sleep apnea    CPAP   Trying to use    MEDICATIONS:  Prior to Admission medications   Medication Sig Start Date End Date Taking? Authorizing Provider  amLODipine (NORVASC) 10 MG tablet TAKE 1 TABLET BY MOUTH EVERY DAY 11/09/18   Richardo Priest, MD  apixaban (ELIQUIS) 5 MG TABS tablet Take 5 mg by mouth 2 (two) times daily.    [provider]  atorvastatin (LIPITOR) 20 MG tablet Take 20mg  (1 tab) 2 days a week Patient taking differently: Take 20 mg by mouth See admin instructions. Take 20mg  (1 tab) 2  days a week on Mondays and Wednesdays 08/23/18   Richardo Priest, MD  budesonide-formoterol [facility]) 160-4.5 MCG/ACT inhaler Inhale 2 puffs into the lungs 2 (two) times daily.    [provider]  calcitRIOL (ROCALTROL) 0.25 MCG capsule Take 0.25 mcg by mouth every Monday, Wednesday, and Friday.    [provider]  cyanocobalamin 1000 MCG tablet Take 1,000 mcg by mouth daily.    [provider]  denosumab (PROLIA) 60 MG/ML SOSY injection Inject 60 mg into the skin every 6 (six) months.    [provider]  ferrous sulfate 325 (65 FE) MG tablet Take 325 mg by mouth as directed. MWF    [provider]  finasteride (PROSCAR) 5 MG tablet Take 5 mg by mouth at bedtime.     [provider]  furosemide (LASIX) 40 MG tablet Take 1 tablet (40 mg total) by mouth 2 (two) times daily. 11/14/18   Rory Percy, DO  hydrALAZINE (APRESOLINE)  25 MG tablet Take 25 mg by mouth 2 (two) times daily.    [provider]  insulin lispro protamine-lispro (HUMALOG 75/25 MIX) (75-25) 100 UNIT/ML SUSP injection Inject into the skin 2 (two) times daily with a meal. See sliding scale instructions.    [provider]  latanoprost (XALATAN) 0.005 % ophthalmic solution Place 1 drop into both eyes at bedtime.    [provider]  loratadine (CLARITIN) 10 MG tablet Take 10 mg by mouth daily.    [provider]  metoprolol tartrate (LOPRESSOR) 100 MG tablet Take 100 mg by mouth 2 (two) times daily.    [provider]  montelukast (SINGULAIR) 10 MG tablet Take 1 tablet (10 mg total) by mouth at bedtime. 07/27/13   Wardell Honour, MD  Multiple Vitamins-Minerals (PRESERVISION AREDS 2 PO) Take 2 tablets by mouth daily.     [provider]  nitroGLYCERIN (NITROSTAT) 0.4 MG SL tablet Place 0.4 mg under the tongue every 5 (five) minutes as needed for chest pain.    [provider]  pilocarpine (PILOCAR) 4 % ophthalmic solution  Place 1 drop into both eyes 3 (three) times daily.     [provider]  predniSONE (DELTASONE) 5 MG tablet Take 5 mg by mouth daily with breakfast.     [provider]  pregabalin (LYRICA) 75 MG capsule Take 75 mg by mouth 2 (two) times daily.     [provider]  SEMAGLUTIDE,0.25 OR 0.5MG /DOS, [place] Inject 1.5 mLs into the skin once a week. 0.5mg /0.332ml    [provider]  Tamsulosin HCl (FLOMAX) 0.4 MG CAPS Take 0.4 mg by mouth 2 (two) times daily.     [provider]    ALLERGIES:  Allergies  Allergen Reactions  . Ace Inhibitors Other (See Comments)    Probably nausea and vomiting per patient   . Actonel [Risedronate] Nausea And Vomiting  . Ciprocinonide [Fluocinolone] Other (See Comments)    Probably nausea and vomiting per patient  . Flunisolide Other (See Comments)    Probably nausea and vomiting per patient   . Metformin And Related Other (See Comments)    Probably nausea and vomiting per patient   . Sertraline Other (See Comments)    Probably nausea and vomiting per patient   . Sulindac Other (See Comments)    Probably nausea and vomiting per patient   . Terazosin Other (See Comments)    Probably nausea and vomiting per patient     SOCIAL HISTORY:  Social History   Tobacco Use  . Smoking status: Former Smoker    Types: Cigarettes    Quit date: 03/16/1957    Years since quitting: 61.8  . Smokeless tobacco: Never Used  Substance Use Topics  . Alcohol use: No    FAMILY HISTORY: Family History  Problem Relation Age of Onset  . Hypertension Other   . Diabetes Brother   . Kidney failure Brother     EXAM: BP (!) 159/81 (BP Location: Right Arm)   Pulse 71   Temp 98.3 F (36.8 C) (Oral)   Resp 14   SpO2 97%  CONSTITUTIONAL: Alert and oriented and responds appropriately to questions.  Elderly, appears uncomfortable HEAD: Normocephalic, atraumatic EYES: Conjunctivae clear, pupils appear equal, EOMI ENT: normal nose;  moist mucous membranes NECK: Supple, no meningismus, no nuchal rigidity, no LAD  CARD: RRR; S1 and S2 appreciated; no murmurs, no clicks, no rubs, no gallops RESP: Normal chest excursion without splinting or tachypnea;  breath sounds clear and equal bilaterally; no wheezes, no rhonchi, no rales, no hypoxia or respiratory distress, speaking full sentences ABD/GI: Normal bowel sounds; non-distended; soft, non-tender, no rebound, no guarding, no peritoneal signs, no hepatosplenomegaly BACK:  The back appears normal but is tender to palpation over the right lumbar paraspinal muscles without redness, warmth, swelling or ecchymosis.  There is a surgical scar over the lumbar spine.  There is no midline tenderness, step-off or deformity noted. EXT: Patient does have some pain over the anterior and lateral right hip there is no redness, warmth, swelling or bony deformity.  No leg length discrepancy.  He does have some pain with internal and external rotation of the hip but no pain with flexion or extension.  He has some tenderness over the medial right thigh.  No anterior thigh or calf tenderness or swelling.  He has 2+ DP pulses bilaterally.  His compartments are soft.  No joint effusion noted.  No lymphadenopathy in the inguinal region.    SKIN: Normal color for age and race; warm; no rash NEURO: Moves all extremities equally, patient reports normal sensation in all extremities, strength 5/5 in all 4 extremities when testing each muscle group individually, negative straight leg raise PSYCH: The patient's mood and manner are appropriate. Grooming and personal hygiene are appropriate.  MEDICAL DECISION MAKING: Patient here with right lumbar back pain that is reproducible with palpation.  He is also having some right hip pain and has pain in the medial aspect of the right anterior thigh.  Low suspicion for DVT.  He has no calf tenderness or swelling.  No signs of compartment syndrome, cellulitis, abscess, shingles,  arterial obstruction, septic arthritis, gout.  Will obtain x-rays of the lumbar spine and hip given worsening pain with 2 falls.  He has not hit his head.  Last fall was approximately 2 weeks ago.  He is on Eliquis but given fall 2 weeks ago doubt worsening epidural hematoma.  He has no infectious symptoms.  His last epidural injection he states was 2 to 3 months ago.  Low suspicion also for epidural abscess, discitis or osteomyelitis.  He states he is very sensitive to pain medication but feels his home oxycodone is not helping.  Will give IM dose of morphine as well as Robaxin as he seems to have some muscle spasms in the right paraspinal lumbar muscles.  We will also check urinalysis given he states he has had previous kidney infections is complaining of some groin pain but denies testicular pain or swelling, penile discharge, dysuria.  Doubt kidney stone.  ED PROGRESS: Urine shows no blood or sign of infection.  X-ray of the hip shows osteopenia without acute osseous abnormality.  X-ray of the spine shows no acute abnormality but does show worsening of the spondylosis at L3-L4.  Patient able to ambulate using his walker without difficulty.  Pain is improved after IM morphine and oral Robaxin.  He has Percocet at home.  Will discharge with prescriptions of Robaxin he is also requesting Zofran as he has had some nausea with his pain medication.  Have recommended close follow-up with his primary care physician and he agrees to this.  Will also follow-up with his pain management specialist.  Discussed at length return precautions.   At this time, I do not feel there is any life-threatening condition present. I have reviewed and discussed all results (EKG, imaging, lab, urine as appropriate) and exam findings with patient/family. I have reviewed nursing notes and appropriate  previous records.  I feel the patient is safe to be discharged home without further emergent workup and can continue workup as an  outpatient as needed. Discussed usual and customary return precautions. Patient/family verbalize understanding and are comfortable with this plan.  Outpatient follow-up has been provided as needed. All questions have been answered.   DEVANSH [name] was evaluated in Emergency Department on 01/08/2019 for the symptoms described in the history of present illness. He was evaluated in the context of the global COVID-19 pandemic, which necessitated consideration that the patient might be at risk for infection with the SARS-CoV-2 virus that causes COVID-19. Institutional protocols and algorithms that pertain to the evaluation of patients at risk for COVID-19 are in a state of rapid change based on information released by regulatory bodies including the CDC and federal and state organizations. These policies and algorithms were followed during the patient's care in the ED.    , Delice Bison, DO 01/08/19 [phone]

## 2019-01-11 ENCOUNTER — Encounter: Payer: Self-pay | Admitting: Family Medicine

## 2019-01-19 ENCOUNTER — Encounter (HOSPITAL_COMMUNITY): Payer: Self-pay | Admitting: Emergency Medicine

## 2019-01-19 ENCOUNTER — Inpatient Hospital Stay (HOSPITAL_COMMUNITY)
Admission: EM | Admit: 2019-01-19 | Discharge: 2019-01-24 | DRG: 074 | Disposition: A | Discharge status: Home Health | Payer: No Typology Code available for payment source | Attending: Internal Medicine | Admitting: Internal Medicine

## 2019-01-19 ENCOUNTER — Other Ambulatory Visit: Payer: Self-pay

## 2019-01-19 ENCOUNTER — Emergency Department (HOSPITAL_COMMUNITY): Payer: No Typology Code available for payment source

## 2019-01-19 DIAGNOSIS — K224 Dyskinesia of esophagus: Secondary | ICD-10-CM | POA: Diagnosis present

## 2019-01-19 DIAGNOSIS — F1123 Opioid dependence with withdrawal: Secondary | ICD-10-CM | POA: Diagnosis not present

## 2019-01-19 DIAGNOSIS — D869 Sarcoidosis, unspecified: Secondary | ICD-10-CM | POA: Diagnosis not present

## 2019-01-19 DIAGNOSIS — M353 Polymyalgia rheumatica: Secondary | ICD-10-CM | POA: Diagnosis present

## 2019-01-19 DIAGNOSIS — Z841 Family history of disorders of kidney and ureter: Secondary | ICD-10-CM

## 2019-01-19 DIAGNOSIS — E860 Dehydration: Secondary | ICD-10-CM

## 2019-01-19 DIAGNOSIS — E1122 Type 2 diabetes mellitus with diabetic chronic kidney disease: Secondary | ICD-10-CM | POA: Diagnosis not present

## 2019-01-19 DIAGNOSIS — G8929 Other chronic pain: Secondary | ICD-10-CM | POA: Diagnosis not present

## 2019-01-19 DIAGNOSIS — E1143 Type 2 diabetes mellitus with diabetic autonomic (poly)neuropathy: Secondary | ICD-10-CM | POA: Diagnosis not present

## 2019-01-19 DIAGNOSIS — Z7952 Long term (current) use of systemic steroids: Secondary | ICD-10-CM

## 2019-01-19 DIAGNOSIS — N401 Enlarged prostate with lower urinary tract symptoms: Secondary | ICD-10-CM | POA: Diagnosis not present

## 2019-01-19 DIAGNOSIS — Z7901 Long term (current) use of anticoagulants: Secondary | ICD-10-CM

## 2019-01-19 DIAGNOSIS — I130 Hypertensive heart and chronic kidney disease with heart failure and stage 1 through stage 4 chronic kidney disease, or unspecified chronic kidney disease: Secondary | ICD-10-CM | POA: Diagnosis present

## 2019-01-19 DIAGNOSIS — K219 Gastro-esophageal reflux disease without esophagitis: Secondary | ICD-10-CM | POA: Diagnosis not present

## 2019-01-19 DIAGNOSIS — Z20828 Contact with and (suspected) exposure to other viral communicable diseases: Secondary | ICD-10-CM | POA: Diagnosis not present

## 2019-01-19 DIAGNOSIS — Z79899 Other long term (current) drug therapy: Secondary | ICD-10-CM

## 2019-01-19 DIAGNOSIS — I5032 Chronic diastolic (congestive) heart failure: Secondary | ICD-10-CM | POA: Diagnosis present

## 2019-01-19 DIAGNOSIS — K2940 Chronic atrophic gastritis without bleeding: Secondary | ICD-10-CM | POA: Diagnosis present

## 2019-01-19 DIAGNOSIS — B3781 Candidal esophagitis: Secondary | ICD-10-CM | POA: Diagnosis present

## 2019-01-19 DIAGNOSIS — N179 Acute kidney failure, unspecified: Secondary | ICD-10-CM

## 2019-01-19 DIAGNOSIS — R338 Other retention of urine: Secondary | ICD-10-CM | POA: Diagnosis present

## 2019-01-19 DIAGNOSIS — Z87891 Personal history of nicotine dependence: Secondary | ICD-10-CM

## 2019-01-19 DIAGNOSIS — K228 Other specified diseases of esophagus: Secondary | ICD-10-CM | POA: Diagnosis present

## 2019-01-19 DIAGNOSIS — G4733 Obstructive sleep apnea (adult) (pediatric): Secondary | ICD-10-CM | POA: Diagnosis present

## 2019-01-19 DIAGNOSIS — Z7951 Long term (current) use of inhaled steroids: Secondary | ICD-10-CM

## 2019-01-19 DIAGNOSIS — Z888 Allergy status to other drugs, medicaments and biological substances status: Secondary | ICD-10-CM

## 2019-01-19 DIAGNOSIS — R1110 Vomiting, unspecified: Secondary | ICD-10-CM | POA: Insufficient documentation

## 2019-01-19 DIAGNOSIS — R1115 Cyclical vomiting syndrome unrelated to migraine: Secondary | ICD-10-CM | POA: Diagnosis present

## 2019-01-19 DIAGNOSIS — K317 Polyp of stomach and duodenum: Secondary | ICD-10-CM | POA: Diagnosis present

## 2019-01-19 DIAGNOSIS — N184 Chronic kidney disease, stage 4 (severe): Secondary | ICD-10-CM | POA: Diagnosis not present

## 2019-01-19 DIAGNOSIS — M4726 Other spondylosis with radiculopathy, lumbar region: Secondary | ICD-10-CM | POA: Diagnosis present

## 2019-01-19 DIAGNOSIS — I480 Paroxysmal atrial fibrillation: Secondary | ICD-10-CM | POA: Diagnosis not present

## 2019-01-19 DIAGNOSIS — Z8249 Family history of ischemic heart disease and other diseases of the circulatory system: Secondary | ICD-10-CM

## 2019-01-19 DIAGNOSIS — K229 Disease of esophagus, unspecified: Secondary | ICD-10-CM | POA: Diagnosis not present

## 2019-01-19 DIAGNOSIS — E876 Hypokalemia: Secondary | ICD-10-CM

## 2019-01-19 DIAGNOSIS — J449 Chronic obstructive pulmonary disease, unspecified: Secondary | ICD-10-CM | POA: Diagnosis present

## 2019-01-19 DIAGNOSIS — J9811 Atelectasis: Secondary | ICD-10-CM | POA: Diagnosis not present

## 2019-01-19 DIAGNOSIS — Z833 Family history of diabetes mellitus: Secondary | ICD-10-CM

## 2019-01-19 DIAGNOSIS — E785 Hyperlipidemia, unspecified: Secondary | ICD-10-CM | POA: Diagnosis present

## 2019-01-19 DIAGNOSIS — R5381 Other malaise: Secondary | ICD-10-CM | POA: Diagnosis not present

## 2019-01-19 DIAGNOSIS — M069 Rheumatoid arthritis, unspecified: Secondary | ICD-10-CM | POA: Diagnosis present

## 2019-01-19 DIAGNOSIS — Z794 Long term (current) use of insulin: Secondary | ICD-10-CM

## 2019-01-19 DIAGNOSIS — R112 Nausea with vomiting, unspecified: Secondary | ICD-10-CM

## 2019-01-19 DIAGNOSIS — R52 Pain, unspecified: Secondary | ICD-10-CM | POA: Diagnosis not present

## 2019-01-19 DIAGNOSIS — Z951 Presence of aortocoronary bypass graft: Secondary | ICD-10-CM

## 2019-01-19 DIAGNOSIS — N390 Urinary tract infection, site not specified: Secondary | ICD-10-CM | POA: Diagnosis not present

## 2019-01-19 DIAGNOSIS — R531 Weakness: Secondary | ICD-10-CM | POA: Diagnosis not present

## 2019-01-19 DIAGNOSIS — E1140 Type 2 diabetes mellitus with diabetic neuropathy, unspecified: Secondary | ICD-10-CM | POA: Diagnosis not present

## 2019-01-19 DIAGNOSIS — R7889 Finding of other specified substances, not normally found in blood: Secondary | ICD-10-CM | POA: Diagnosis not present

## 2019-01-19 DIAGNOSIS — Z881 Allergy status to other antibiotic agents status: Secondary | ICD-10-CM

## 2019-01-19 LAB — CBC WITH DIFFERENTIAL/PLATELET
Abs Immature Granulocytes: 0.03 10*3/uL (ref 0.00–0.07)
Basophils Absolute: 0 10*3/uL (ref 0.0–0.1)
Basophils Relative: 1 %
Eosinophils Absolute: 0 10*3/uL (ref 0.0–0.5)
Eosinophils Relative: 0 %
HCT: 40.9 % (ref 39.0–52.0)
Hemoglobin: 13.2 g/dL (ref 13.0–17.0)
Immature Granulocytes: 0 %
Lymphocytes Relative: 13 %
Lymphs Abs: 0.9 10*3/uL (ref 0.7–4.0)
MCH: 31.1 pg (ref 26.0–34.0)
MCHC: 32.3 g/dL (ref 30.0–36.0)
MCV: 96.2 fL (ref 80.0–100.0)
Monocytes Absolute: 0.8 10*3/uL (ref 0.1–1.0)
Monocytes Relative: 13 %
Neutro Abs: 4.9 10*3/uL (ref 1.7–7.7)
Neutrophils Relative %: 73 %
Platelets: 179 10*3/uL (ref 150–400)
RBC: 4.25 MIL/uL (ref 4.22–5.81)
RDW: 14.5 % (ref 11.5–15.5)
WBC: 6.7 10*3/uL (ref 4.0–10.5)
nRBC: 0 % (ref 0.0–0.2)

## 2019-01-19 LAB — COMPREHENSIVE METABOLIC PANEL
ALT: 19 U/L (ref 0–44)
AST: 18 U/L (ref 15–41)
Albumin: 3.9 g/dL (ref 3.5–5.0)
Alkaline Phosphatase: 71 U/L (ref 38–126)
Anion gap: 13 (ref 5–15)
BUN: 29 mg/dL — ABNORMAL HIGH (ref 8–23)
CO2: 28 mmol/L (ref 22–32)
Calcium: 8.6 mg/dL — ABNORMAL LOW (ref 8.9–10.3)
Chloride: 101 mmol/L (ref 98–111)
Creatinine, Ser: 3.29 mg/dL — ABNORMAL HIGH (ref 0.61–1.24)
GFR calc Af Amer: 20 mL/min — ABNORMAL LOW (ref >60)
GFR calc non Af Amer: 17 mL/min — ABNORMAL LOW (ref >60)
Glucose, Bld: 164 mg/dL — ABNORMAL HIGH (ref 70–99)
Potassium: 2.8 mmol/L — ABNORMAL LOW (ref 3.5–5.1)
Sodium: 142 mmol/L (ref 135–145)
Total Bilirubin: 1.7 mg/dL — ABNORMAL HIGH (ref 0.3–1.2)
Total Protein: 6.7 g/dL (ref 6.5–8.1)

## 2019-01-19 LAB — BRAIN NATRIURETIC PEPTIDE: B Natriuretic Peptide: 267.3 pg/mL — ABNORMAL HIGH (ref 0.0–100.0)

## 2019-01-19 LAB — LIPASE, BLOOD: Lipase: 26 U/L (ref 11–51)

## 2019-01-19 LAB — GLUCOSE, CAPILLARY: Glucose-Capillary: 138 mg/dL — ABNORMAL HIGH (ref 70–99)

## 2019-01-19 MED ORDER — HYDROMORPHONE HCL 1 MG/ML IJ SOLN
0.5000 mg | INTRAMUSCULAR | Status: DC | PRN
  Administered 2019-01-19 – 2019-01-24 (×14): 0.5 mg via INTRAVENOUS
  Filled 2019-01-19 (×14): qty 0.5

## 2019-01-19 MED ORDER — APIXABAN 5 MG PO TABS
5.0000 mg | ORAL_TABLET | Freq: Two times a day (BID) | ORAL | Status: DC
  Administered 2019-01-19 – 2019-01-21 (×5): 5 mg via ORAL
  Filled 2019-01-19 (×3): qty 1
  Filled 2019-01-19: qty 2
  Filled 2019-01-19: qty 1

## 2019-01-19 MED ORDER — LATANOPROST 0.005 % OP SOLN
1.0000 [drp] | Freq: Every day | OPHTHALMIC | Status: DC
  Administered 2019-01-19 – 2019-01-23 (×5): 1 [drp] via OPHTHALMIC
  Filled 2019-01-19: qty 2.5

## 2019-01-19 MED ORDER — ONDANSETRON HCL 4 MG/2ML IJ SOLN
4.0000 mg | Freq: Four times a day (QID) | INTRAMUSCULAR | Status: DC | PRN
  Administered 2019-01-20: 4 mg via INTRAVENOUS
  Filled 2019-01-19: qty 2

## 2019-01-19 MED ORDER — HYDRALAZINE HCL 25 MG PO TABS
25.0000 mg | ORAL_TABLET | Freq: Two times a day (BID) | ORAL | Status: DC
  Administered 2019-01-19 – 2019-01-24 (×10): 25 mg via ORAL
  Filled 2019-01-19 (×9): qty 1

## 2019-01-19 MED ORDER — INSULIN ASPART 100 UNIT/ML ~~LOC~~ SOLN
0.0000 [IU] | Freq: Every day | SUBCUTANEOUS | Status: DC
  Filled 2019-01-19: qty 0.05

## 2019-01-19 MED ORDER — HEPARIN SODIUM (PORCINE) 5000 UNIT/ML IJ SOLN: 5000.0000 [IU] | Freq: Three times a day (TID) | INTRAMUSCULAR | Status: DC

## 2019-01-19 MED ORDER — FINASTERIDE 5 MG PO TABS
5.0000 mg | ORAL_TABLET | Freq: Every day | ORAL | Status: DC
  Administered 2019-01-20 – 2019-01-23 (×4): 5 mg via ORAL
  Filled 2019-01-19 (×4): qty 1

## 2019-01-19 MED ORDER — POTASSIUM CHLORIDE IN NACL 40-0.9 MEQ/L-% IV SOLN
INTRAVENOUS | Status: DC
  Administered 2019-01-19 – 2019-01-20 (×2): 100 mL/h via INTRAVENOUS
  Filled 2019-01-19 (×3): qty 1000

## 2019-01-19 MED ORDER — SODIUM CHLORIDE 0.9 % IV SOLN: INTRAVENOUS | Status: DC

## 2019-01-19 MED ORDER — SODIUM CHLORIDE 0.9 % IV BOLUS
500.0000 mL | Freq: Once | INTRAVENOUS | Status: AC
  Administered 2019-01-19: 500 mL via INTRAVENOUS

## 2019-01-19 MED ORDER — ATORVASTATIN CALCIUM 20 MG PO TABS: 20.0000 mg | ORAL_TABLET | ORAL | Status: DC

## 2019-01-19 MED ORDER — PREDNISONE 5 MG PO TABS
5.0000 mg | ORAL_TABLET | Freq: Every day | ORAL | Status: DC
  Administered 2019-01-20 – 2019-01-24 (×4): 5 mg via ORAL
  Filled 2019-01-19 (×4): qty 1

## 2019-01-19 MED ORDER — PILOCARPINE HCL 4 % OP SOLN
1.0000 [drp] | Freq: Three times a day (TID) | OPHTHALMIC | Status: DC
  Administered 2019-01-19 – 2019-01-24 (×14): 1 [drp] via OPHTHALMIC
  Filled 2019-01-19: qty 15

## 2019-01-19 MED ORDER — POTASSIUM CHLORIDE CRYS ER 20 MEQ PO TBCR
40.0000 meq | EXTENDED_RELEASE_TABLET | Freq: Once | ORAL | Status: AC
  Administered 2019-01-19: 40 meq via ORAL
  Filled 2019-01-19: qty 2

## 2019-01-19 MED ORDER — METOPROLOL TARTRATE 50 MG PO TABS
100.0000 mg | ORAL_TABLET | Freq: Two times a day (BID) | ORAL | Status: DC
  Administered 2019-01-19 – 2019-01-24 (×10): 100 mg via ORAL
  Filled 2019-01-19 (×10): qty 2

## 2019-01-19 MED ORDER — INSULIN ASPART 100 UNIT/ML ~~LOC~~ SOLN
0.0000 [IU] | Freq: Three times a day (TID) | SUBCUTANEOUS | Status: DC
  Administered 2019-01-20: 3 [IU] via SUBCUTANEOUS
  Administered 2019-01-20: 1 [IU] via SUBCUTANEOUS
  Administered 2019-01-20 – 2019-01-21 (×3): 3 [IU] via SUBCUTANEOUS
  Administered 2019-01-21 – 2019-01-24 (×7): 2 [IU] via SUBCUTANEOUS
  Administered 2019-01-24: 3 [IU] via SUBCUTANEOUS
  Filled 2019-01-19: qty 0.09

## 2019-01-19 MED ORDER — ATORVASTATIN CALCIUM 10 MG PO TABS
20.0000 mg | ORAL_TABLET | ORAL | Status: DC
  Administered 2019-01-23: 20 mg via ORAL
  Filled 2019-01-19: qty 2

## 2019-01-19 MED ORDER — FENTANYL CITRATE (PF) 100 MCG/2ML IJ SOLN
12.5000 ug | Freq: Once | INTRAMUSCULAR | Status: AC
  Administered 2019-01-19: 12.5 ug via INTRAVENOUS
  Filled 2019-01-19: qty 2

## 2019-01-19 MED ORDER — ONDANSETRON HCL 4 MG/2ML IJ SOLN
4.0000 mg | Freq: Once | INTRAMUSCULAR | Status: AC
  Administered 2019-01-19: 4 mg via INTRAVENOUS
  Filled 2019-01-19: qty 2

## 2019-01-19 MED ORDER — POTASSIUM CHLORIDE 10 MEQ/100ML IV SOLN
10.0000 meq | Freq: Once | INTRAVENOUS | Status: AC
  Administered 2019-01-19: 10 meq via INTRAVENOUS
  Filled 2019-01-19: qty 100

## 2019-01-19 MED ORDER — PANTOPRAZOLE SODIUM 40 MG IV SOLR
40.0000 mg | Freq: Two times a day (BID) | INTRAVENOUS | Status: DC
  Administered 2019-01-19 – 2019-01-24 (×11): 40 mg via INTRAVENOUS
  Filled 2019-01-19 (×10): qty 40

## 2019-01-19 MED ORDER — ENSURE ENLIVE PO LIQD
237.0000 mL | Freq: Two times a day (BID) | ORAL | Status: DC
  Administered 2019-01-20: 237 mL via ORAL

## 2019-01-19 MED ORDER — TAMSULOSIN HCL 0.4 MG PO CAPS
0.4000 mg | ORAL_CAPSULE | Freq: Two times a day (BID) | ORAL | Status: DC
  Administered 2019-01-19 – 2019-01-24 (×10): 0.4 mg via ORAL
  Filled 2019-01-19 (×10): qty 1

## 2019-01-19 NOTE — Progress Notes (Signed)
New Admission Note:  Patient arrived to 5E, A X O c/o pain 7/10. Awaiting orders. Patient ambulatory with weakness. Orthostatic vitals complete. Patient on tele. Skin intact. Patient oriented to staff. No questions. Will continue to monitor.   Today's Vitals   01/19/19 1908 01/19/19 1930 01/19/19 2026 01/19/19 2035  BP:  (!) 170/91 (!) 141/70   Pulse:  83 86   Resp:  16 16   Temp:   97.9 F (36.6 C)   TempSrc:      SpO2:  91% 97%   PainSc: 5    7    There is no height or weight on file to calculate BMI.

## 2019-01-19 NOTE — ED Notes (Signed)
Patient aware that we need a urine specimen

## 2019-01-19 NOTE — ED Notes (Signed)
Hospitalist at bedside 

## 2019-01-19 NOTE — H&P (Signed)
History and Physical    Jacob [name] UKG:[id number] DOB: 1941-03-03 DOA: 01/19/2019  PCP: Gifford Shave, MD Patient coming from: Home Chief Complaint: Nausea and vomiting for 2 weeks HPI: Jacob [name] is a 78 y.o. male with medical history significant of diastolic heart failure, paroxysmal atrial fibrillation on Eliquis, CKD stage IV, type 2 diabetes with neuropathy, rheumatoid arthritis, COPD, sarcoidosis, obstructive sleep apnea and BPH admitted with complaints of intractable nausea and vomiting for the last 2 weeks.  Denies any diarrhea or abdominal pain.  Denies any hematemesis melena.  Wife reports that he is very lightheaded and unable to stand up.  Denies headache or changes with his vision.  Denies sick contacts.  Patient headache Covid test 3 weeks ago which was negative.  Denies chest pain shortness of breath cough.  He [facility]  He has no history of gastric ulcer or gastritis. He has chronic back pain radiates to his right leg followed by neurology for lumbar radiculopathy.    ED Course: Patient received IV fluids and potassium replacement  Review of Systems: As per HPI otherwise all other systems reviewed and are negative  Ambulatory Status: Ambulatory at baseline but due to back pain and bilateral leg pain he has been walking less than usual.  Past Medical History:  Diagnosis Date  . Allergy   . Anemia   . Anxiety   . Arthritis   . Asthma   . BPH (benign prostatic hyperplasia)   . Cancer of kidney ([place])   . Cataract   . CHF (congestive heart failure) ([place])   . Chronic kidney disease    chronic  kidney failure  kidney function at 42%  . COPD (chronic obstructive pulmonary disease) ([place])   . Coronary artery disease    CABG  7 bypasses  . Diabetes mellitus without complication ([place])   . Facial cellulitis   . GERD (gastroesophageal reflux disease)   . Gout   . Hepatitis    many years ago  . Hyperlipidemia   .  Hypertension   . Lumbar disc disease   . Obesity   . Paroxysmal atrial fibrillation (HCC)   . Peripheral neuropathy   . Polymyalgia rheumatica (HCC)    maintained on Prednisone, Plaquenil. Followed by rhuematology every 4 months/[name].  . Shortness of breath dyspnea    with exertion  . Sleep apnea    CPAP   Trying to use    Past Surgical History:  Procedure Laterality Date  . APPENDECTOMY    . CARDIAC CATHETERIZATION  1998, 2000   left  . CATARACT EXTRACTION W/PHACO Right 11/28/2014   Procedure: CATARACT EXTRACTION PHACO AND INTRAOCULAR LENS PLACEMENT ([place]) RIGHT ;  Surgeon: Marylynn Pearson, MD;  Location: [facility];  Service: Ophthalmology;  Laterality: Right;  . CORONARY ARTERY BYPASS GRAFT  03/17/1995   Wynonia Lawman; followed every six months.  Marland Kitchen HERNIA REPAIR    . LUMBAR LAMINECTOMY/DECOMPRESSION MICRODISCECTOMY N/A 07/30/2015   Procedure: LUMBAR LAMINECTOMY DISCECTOMY ;  Surgeon: Ashok Pall, MD;  Location: [facility];  Service: Neurosurgery;  Laterality: N/A;  LUMBAR LAMINECTOMY DISCECTOMY   . LUMBAR LAMINECTOMY/DECOMPRESSION MICRODISCECTOMY N/A 09/06/2015   Procedure: Redo L3/4 Disectomy;  Surgeon: Ashok Pall, MD;  Location: [facility];  Service: Neurosurgery;  Laterality: N/A;  . PROSTATE SURGERY     TURP at [place].  Marland Kitchen TRANSURETHRAL RESECTION OF PROSTATE      Social History   Socioeconomic History  . Marital  status: Married    Spouse name: Not on file  . Number of children: Not on file  . Years of education: Not on file  . Highest education level: Not on file  Occupational History  . Occupation: Diplomatic Services operational officer  . Financial resource strain: Not on file  . Food insecurity    Worry: Not on file    Inability: Not on file  . Transportation needs    Medical: Not on file    Non-medical: Not on file  Tobacco Use  . Smoking status: Former Smoker    Types: Cigarettes    Quit date: 03/16/1957    Years since quitting: 61.8  . Smokeless tobacco: Never Used  Substance and  Sexual Activity  . Alcohol use: No  . Drug use: No  . Sexual activity: Not Currently    Birth control/protection: None  Lifestyle  . Physical activity    Days per week: Not on file    Minutes per session: Not on file  . Stress: Not on file  Relationships  . Social Herbalist on phone: Not on file    Gets together: Not on file    Attends religious service: Not on file    Active member of club or organization: Not on file    Attends meetings of clubs or organizations: Not on file    Relationship status: Not on file  . Intimate partner violence    Fear of current or ex partner: Not on file    Emotionally abused: Not on file    Physically abused: Not on file    Forced sexual activity: Not on file  Other Topics Concern  . Not on file  Social History Narrative   Marital status: Married x 55 years.      Children: 3 children; 5 grandchildren; 6 gg.      Lives: with wife.        Employment: retired; [employer].  Working every other week; 45 hours.      Tobacco:  Quit at age 30.      Alcohol:  None      Exercise: none      ADLs: indepedent with ADLs;  Uses cane and has a walker.  Drives.      Advanced Directives:  +Living Will.  DNR/DNI.  Wife is HCPOA.   Right hand   One story    Allergies  Allergen Reactions  . Ace Inhibitors Other (See Comments)    Probably nausea and vomiting per patient   . Actonel [Risedronate] Nausea And Vomiting  . Ciprocinonide [Fluocinolone] Other (See Comments)    Probably nausea and vomiting per patient  . Flunisolide Other (See Comments)    Probably nausea and vomiting per patient   . Metformin And Related Other (See Comments)    Probably nausea and vomiting per patient   . Sertraline Other (See Comments)    Probably nausea and vomiting per patient   . Sulindac Other (See Comments)    Probably nausea and vomiting per patient   . Terazosin Other (See Comments)    Probably nausea and vomiting per patient      Family History  Problem Relation Age of Onset  . Hypertension Other   . Diabetes Brother   . Kidney failure Brother      Prior to Admission medications   Medication Sig Start Date End Date Taking? Authorizing Provider  amLODipine (NORVASC) 10 MG tablet TAKE 1 TABLET BY  MOUTH EVERY DAY Patient not taking: Reported on 01/08/2019 11/09/18   Richardo Priest, MD  apixaban (ELIQUIS) 5 MG TABS tablet Take 5 mg by mouth 2 (two) times daily.    [provider]  atorvastatin (LIPITOR) 20 MG tablet Take 20mg  (1 tab) 2 days a week Patient taking differently: Take 20 mg by mouth See admin instructions. Take 20mg  (1 tab) 2 days a week on Mondays and Wednesdays 08/23/18   Richardo Priest, MD  budesonide-formoterol [facility]) 160-4.5 MCG/ACT inhaler Inhale 2 puffs into the lungs 2 (two) times daily.    [provider]  calcitRIOL (ROCALTROL) 0.25 MCG capsule Take 0.25 mcg by mouth every Monday, Wednesday, and Friday.    [provider]  cyanocobalamin 1000 MCG tablet Take 1,000 mcg by mouth daily.    [provider]  denosumab (PROLIA) 60 MG/ML SOSY injection Inject 60 mg into the skin every 6 (six) months.    [provider]  ferrous sulfate 325 (65 FE) MG tablet Take 325 mg by mouth as directed. MWF    [provider]  finasteride (PROSCAR) 5 MG tablet Take 5 mg by mouth at bedtime.     [provider]  furosemide (LASIX) 40 MG tablet Take 1 tablet (40 mg total) by mouth 2 (two) times daily. 11/14/18   Rory Percy, DO  hydrALAZINE (APRESOLINE) 25 MG tablet Take 25 mg by mouth 2 (two) times daily.    [provider]  insulin lispro protamine-lispro (HUMALOG 75/25 MIX) (75-25) 100 UNIT/ML SUSP injection Inject into the skin 2 (two) times daily with a meal. See sliding scale instructions.    [provider]  latanoprost (XALATAN) 0.005 % ophthalmic solution Place 1 drop into both eyes at bedtime.    [provider]   loratadine (CLARITIN) 10 MG tablet Take 10 mg by mouth daily.    [provider]  methocarbamol (ROBAXIN) 500 MG tablet Take 1 tablet (500 mg total) by mouth every 8 (eight) hours as needed for muscle spasms. 01/08/19   Ward, Delice Bison, DO  metoprolol tartrate (LOPRESSOR) 100 MG tablet Take 100 mg by mouth 2 (two) times daily.    [provider]  montelukast (SINGULAIR) 10 MG tablet Take 1 tablet (10 mg total) by mouth at bedtime. 07/27/13   Wardell Honour, MD  Multiple Vitamins-Minerals (PRESERVISION AREDS 2 PO) Take 2 tablets by mouth daily.     [provider]  nitroGLYCERIN (NITROSTAT) 0.4 MG SL tablet Place 0.4 mg under the tongue every 5 (five) minutes as needed for chest pain.    [provider]  ondansetron (ZOFRAN ODT) 4 MG disintegrating tablet Take 1 tablet (4 mg total) by mouth every 6 (six) hours as needed. 01/08/19   Ward, Delice Bison, DO  pilocarpine (PILOCAR) 4 % ophthalmic solution Place 1 drop into both eyes 3 (three) times daily.     [provider]  predniSONE (DELTASONE) 5 MG tablet Take 5 mg by mouth daily with breakfast.     [provider]  SEMAGLUTIDE,0.25 OR 0.5MG /DOS, [place] Inject 1.5 mLs into the skin once a week. 0.5mg /0.31ml    [provider]  Tamsulosin HCl (FLOMAX) 0.4 MG CAPS Take 0.4 mg by mouth 2 (two) times daily.     [provider]    Physical Exam: Vitals:   01/19/19 1411 01/19/19 1430 01/19/19 1500 01/19/19 1800  BP: (!) 157/81 (!) 154/83 (!) 154/82 (!) 170/91  Pulse: 78 75 77 85  Resp: 18 15 17 13   Temp: 98.1 F (36.7 C)     TempSrc: Oral     SpO2: 98% 97% 95% 95%     . General:  Appears calm and comfortable . Eyes:  PERRL, EOMI, normal lids, iris . ENT:  grossly normal hearing, lips & tongue, mmm . Neck:  no LAD, masses or thyromegaly . Cardiovascular: RRR, no m/r/g. No LE edema.  Marland Kitchen Respiratory: CTA bilaterally, no w/r/r. Normal respiratory effort. . Abdomen: soft, ntnd,  NABS . Skin:  no rash or induration seen on limited exam . Musculoskeletal:  grossly normal tone BUE/BLE, decreased range of motion in both lower extremities due to lower back pain.  No bony abnormality . Psychiatric:  grossly normal mood and affect, speech fluent and appropriate, AOx3 . Neurologic:  CN 2-12 grossly intact,  sensation intact  Labs on Admission: I have personally reviewed following labs and imaging studies  CBC: Recent Labs  Lab 01/19/19 1514  WBC 6.7  NEUTROABS 4.9  HGB 13.2  HCT 40.9  MCV 96.2  PLT 235   Basic Metabolic Panel: Recent Labs  Lab 01/19/19 1514  NA 142  K 2.8*  CL 101  CO2 28  GLUCOSE 164*  BUN 29*  CREATININE 3.29*  CALCIUM 8.6*   GFR: CrCl cannot be calculated (Unknown ideal weight.). Liver Function Tests: Recent Labs  Lab 01/19/19 1514  AST 18  ALT 19  ALKPHOS 71  BILITOT 1.7*  PROT 6.7  ALBUMIN 3.9   Recent Labs  Lab 01/19/19 1514  LIPASE 26   No results for input(s): AMMONIA in the last 168 hours. Coagulation Profile: No results for input(s): INR, PROTIME in the last 168 hours. Cardiac Enzymes: No results for input(s): CKTOTAL, CKMB, CKMBINDEX, TROPONINI in the last 168 hours. BNP (last 3 results) Recent Labs    08/23/18 1058  PROBNP 3,661*   HbA1C: No results for input(s): HGBA1C in the last 72 hours. CBG: No results for input(s): GLUCAP in the last 168 hours. Lipid Profile: No results for input(s): CHOL, HDL, LDLCALC, TRIG, CHOLHDL, LDLDIRECT in the last 72 hours. Thyroid Function Tests: No results for input(s): TSH, T4TOTAL, FREET4, T3FREE, THYROIDAB in the last 72 hours. Anemia Panel: No results for input(s): VITAMINB12, FOLATE, FERRITIN, TIBC, IRON, RETICCTPCT in the last 72 hours. Urine analysis:    Component Value Date/Time   COLORURINE YELLOW 01/08/2019 0259   APPEARANCEUR HAZY (A) 01/08/2019 0259   LABSPEC 1.013 01/08/2019 0259   PHURINE 5.0 01/08/2019 0259   GLUCOSEU NEGATIVE 01/08/2019 0259    HGBUR NEGATIVE 01/08/2019 0259   BILIRUBINUR NEGATIVE 01/08/2019 0259   BILIRUBINUR neg 09/09/2013 1233   KETONESUR NEGATIVE 01/08/2019 0259   PROTEINUR >=300 (A) 01/08/2019 0259   UROBILINOGEN 0.2 09/09/2013 1233   NITRITE NEGATIVE 01/08/2019 0259   LEUKOCYTESUR NEGATIVE 01/08/2019 0259    Creatinine Clearance: CrCl cannot be calculated (Unknown ideal weight.).  Sepsis Labs: @LABRCNTIP (procalcitonin:4,lacticidven:4) )No results found for this or any previous visit (from the past 240 hour(s)).   Radiological Exams on Admission: Dg Chest Port 1 View  Result Date: 01/19/2019 CLINICAL DATA:  Failure to thrive EXAM: PORTABLE CHEST 1 VIEW COMPARISON:  Radiograph November 01, 2018, CT Jul 27, 2015 FINDINGS: Postsurgical changes related to prior CABG including intact and aligned sternotomy wires and multiple surgical clips projecting over the mediastinum. Stable cardiomegaly. Calcified aorta is again noted. Central pulmonary arterial enlargement is suggestive of chronic pulmonary artery hypertension, better seen on comparison CTs. Few scattered calcified granulomata are  again seen in the lungs. Hazy basilar atelectatic changes are noted. No convincing features of edema. No consolidative process, pneumothorax or effusion. Degenerative changes are present in the imaged spine and shoulders. IMPRESSION: 1. Atelectasis, otherwise no acute cardiopulmonary disease. 2. Postsurgical changes related to prior CABG. 3. Findings suggestive of chronic pulmonary artery hypertension, better seen on comparison CT. Electronically Signed   By: Lovena Le M.D.   On: 01/19/2019 15:30    EKG: Independently reviewed.  Assessment/Plan Active Problems:   * No active hospital problems. *   #1 intractable nausea and vomiting of unclear etiology patient admitted with 2-week history of intractable nausea vomiting unable to keep anything down associated with lightheadedness. Will admit to telemetry Hydrate with normal  saline Zofran for nausea PPI twice daily If symptoms not improved will consult GI in a.m.  #2 hypokalemia with CKD stage IV replete carefully check BMP later today.  #3 history of stage IV CKD creatinine slightly higher than his normal baseline at the time of admission creatinine 3.29 he mostly runs around 2.97.  IV fluids follow-up labs later.  Hold Lasix  #4 dehydration with possible orthostasis check orthostasis IV hydration  #5 hypertension restart home medications hold Lasix  #6 type 2 diabetes with neuropathy SSI hold long-acting insulin  #7 history of heart failure with preserved ejection fraction monitor closely hold Lasix  Rehydrate cautiously.  Severity of Illness: The appropriate patient status for this patient is INPATIENT. Inpatient status is judged to be reasonable and necessary in order to provide the required intensity of service to ensure the patient's safety. The patient's presenting symptoms, physical exam findings, and initial radiographic and laboratory data in the context of their chronic comorbidities is felt to place them at high risk for further clinical deterioration. Furthermore, it is not anticipated that the patient will be medically stable for discharge from the hospital within 2 midnights of admission. The following factors support the patient status of inpatient.   " The patient's presenting symptoms include intractable nausea vomiting. " The worrisome physical exam findings include dry mucous membrane " The initial radiographic and laboratory data are worrisome because of severe hypokalemia CKD stage IV worsening. " The chronic co-morbidities include CKD stage IV type 2 diabetes hypertension CHF   * I certify that at the point of admission it is my clinical judgment that the patient will require inpatient hospital care spanning beyond 2 midnights from the point of admission due to high intensity of service, high risk for further deterioration and high  frequency of surveillance required.*    Estimated body mass index is 31.82 kg/m as calculated from the following:   Height as of 01/03/19: 5\' 11"  (1.803 m).   Weight as of 01/03/19: 103.5 kg.   DVT prophylaxis: Eliquis Code Status: Full code Family Communication: Discussed with wife Disposition Plan: Pending clinical improvement Consults called: None Admission status: Inpatient   Georgette Shell MD [facility]  If 7PM-7AM, please contact night-coverage [email or web address] Password [facility]  01/19/2019, 6:13 PM

## 2019-01-19 NOTE — ED Triage Notes (Signed)
Per EMS, patient from home, daughter reports recent d/c for weakness. Reports continued decreased appetite and decreased urine output. Daughter reports concern for failure to thrive. Ambulatory.

## 2019-01-19 NOTE — ED Provider Notes (Signed)
[facility] DEPT Provider Note   CSN: [id number] Arrival date & time: 01/19/19  1404     History   Chief Complaint Chief Complaint  Patient presents with  . Weakness    HPI Jacob [name] is a 78 y.o. male.     Patient referred in from the [place] clinic in [place] for abnormal labs.  [name] and patient report that for 2 weeks he has had dry heaves and persistent vomiting not really able to keep much down.  Certainly got worse in the last 3 days.  Definitely worse at the [place] this morning.  Patient really not eating not drinking.  He has chronic back pain he is on oxycodone for that.  But there is no change in the prescription he has not run out of any of them.  Has been on that for 3 months and has not had any vomiting issues.  He is on that for his chronic back pain.  Currently not followed by pain management.  No diarrhea no fever no congestion no vomiting of blood.  [facility] was showing worsening of his [facility] function.  And that his potassium was 2.5.     Past Medical History:  Diagnosis Date  . Allergy   . Anemia   . Anxiety   . Arthritis   . Asthma   . BPH (benign prostatic hyperplasia)   . Cancer of kidney ([place])   . Cataract   . CHF (congestive heart failure) ([place])   . Chronic kidney disease    chronic  kidney failure  kidney function at 42%  . COPD (chronic obstructive pulmonary disease) ([place])   . Coronary artery disease    CABG  7 bypasses  . Diabetes mellitus without complication ([place])   . Facial cellulitis   . GERD (gastroesophageal reflux disease)   . Gout   . Hepatitis    many years ago  . Hyperlipidemia   . Hypertension   . Lumbar disc disease   . Obesity   . Paroxysmal atrial fibrillation (HCC)   . Peripheral neuropathy   . Polymyalgia rheumatica (HCC)    maintained on Prednisone, Plaquenil. Followed by rhuematology every 4 months/[name].  . Shortness of breath dyspnea    with exertion  .  Sleep apnea    CPAP   Trying to use    Patient Active Problem List   Diagnosis Date Noted  . Intractable nausea and vomiting 01/19/2019  . GERD (gastroesophageal reflux disease) 11/07/2018  . History of CHF (congestive heart failure)   . Facial cellulitis   . Acute on chronic congestive heart failure ([place])   . Gait abnormality 11/02/2018  . Cellulitis 11/01/2018  . Paroxysmal atrial fibrillation ([place]) 08/22/2018  . High risk medication use 08/22/2018  . Chronic anticoagulation 08/22/2018  . On amiodarone therapy 08/22/2018  . Hypertensive heart disease 04/24/2018  . CKD (chronic kidney disease) 11/27/2015  . Chronic diastolic heart failure ([place]) 11/27/2015  . Normocytic anemia 11/27/2015  . AKI (acute kidney injury) ([place])   . Acute renal failure superimposed on stage 4 chronic kidney disease ([place]) 10/02/2015  . Hypotension 10/02/2015  . Anorexia   . Adjustment disorder with mixed emotional features 09/01/2015  . Acute encephalopathy 08/29/2015  . Elevated troponin 08/29/2015  . Renal cancer ([place]) 08/28/2015  . Spinal stenosis of lumbar region with radiculopathy 08/07/2015  . COPD (chronic obstructive pulmonary disease) ([place])   . OSA (obstructive sleep apnea) 07/27/2015  .  HNP (herniated nucleus pulposus), lumbar 07/24/2015  . PMR (polymyalgia rheumatica) ([place]) 09/09/2013  . Rheumatoid arthritis ([place]) 04/13/2012  . CAD (coronary artery disease) 12/19/2011  . Type 2 diabetes mellitus with other specified complication ([place]) 96/28/3662  . Neuropathy ([place]) 12/19/2011    Past Surgical History:  Procedure Laterality Date  . APPENDECTOMY    . CARDIAC CATHETERIZATION  1998, 2000   left  . CATARACT EXTRACTION W/PHACO Right 11/28/2014   Procedure: CATARACT EXTRACTION PHACO AND INTRAOCULAR LENS PLACEMENT ([place]) RIGHT ;  Surgeon: Marylynn Pearson, MD;  Location: [facility];  Service: Ophthalmology;  Laterality: Right;  . CORONARY ARTERY BYPASS GRAFT  03/17/1995   Wynonia Lawman; followed every six  months.  Marland Kitchen HERNIA REPAIR    . LUMBAR LAMINECTOMY/DECOMPRESSION MICRODISCECTOMY N/A 07/30/2015   Procedure: LUMBAR LAMINECTOMY DISCECTOMY ;  Surgeon: Ashok Pall, MD;  Location: [facility] NEURO [facility]S;  Service: Neurosurgery;  Laterality: N/A;  LUMBAR LAMINECTOMY DISCECTOMY   . LUMBAR LAMINECTOMY/DECOMPRESSION MICRODISCECTOMY N/A 09/06/2015   Procedure: Redo L3/4 Disectomy;  Surgeon: Ashok Pall, MD;  Location: [facility]UR[facility]S;  Service: Neurosurgery;  Laterality: N/A;  . PROSTATE SURGERY     TURP at [place].  Marland Kitchen TRANSURETHRAL RESECTION OF PROSTATE          Home Medications    Prior to Admission medications   Medication Sig Start Date End Date Taking? Authorizing Provider  amLODipine (NORVASC) 10 MG tablet TAKE 1 TABLET BY MOUTH EVERY DAY Patient not taking: Reported on 01/08/2019 11/09/18   Richardo Priest, MD  apixaban (ELIQUIS) 5 MG TABS tablet Take 5 mg by mouth 2 (two) times daily.    [provider]  atorvastatin (LIPITOR) 20 MG tablet Take 20mg  (1 tab) 2 days a week Patient taking differently: Take 20 mg by mouth See admin instructions. Take 20mg  (1 tab) 2 days a week on Mondays and Wednesdays 08/23/18   Richardo Priest, MD  budesonide-formoterol [facility]) 160-4.5 MCG/ACT inhaler Inhale 2 puffs into the lungs 2 (two) times daily.    [provider]  calcitRIOL (ROCALTROL) 0.25 MCG capsule Take 0.25 mcg by mouth every Monday, Wednesday, and Friday.    [provider]  cyanocobalamin 1000 MCG tablet Take 1,000 mcg by mouth daily.    [provider]  denosumab (PROLIA) 60 MG/ML SOSY injection Inject 60 mg into the skin every 6 (six) months.    [provider]  ferrous sulfate 325 (65 FE) MG tablet Take 325 mg by mouth as directed. MWF    [provider]  finasteride (PROSCAR) 5 MG tablet Take 5 mg by mouth at bedtime.     [provider]  furosemide (LASIX) 40 MG tablet Take 1 tablet (40 mg total) by mouth 2 (two) times daily. 11/14/18    Rory Percy, DO  hydrALAZINE (APRESOLINE) 25 MG tablet Take 25 mg by mouth 2 (two) times daily.    [provider]  insulin lispro protamine-lispro (HUMALOG 75/25 MIX) (75-25) 100 UNIT/ML SUSP injection Inject into the skin 2 (two) times daily with a meal. See sliding scale instructions.    [provider]  latanoprost (XALATAN) 0.005 % ophthalmic solution Place 1 drop into both eyes at bedtime.    [provider]  loratadine (CLARITIN) 10 MG tablet Take 10 mg by mouth daily.    [provider]  methocarbamol (ROBAXIN) 500 MG tablet Take 1 tablet (500 mg total) by mouth every 8 (eight) hours as needed for muscle spasms. 01/08/19   Ward, Delice Bison,  DO  metoprolol tartrate (LOPRESSOR) 100 MG tablet Take 100 mg by mouth 2 (two) times daily.    [provider]  montelukast (SINGULAIR) 10 MG tablet Take 1 tablet (10 mg total) by mouth at bedtime. 07/27/13   Wardell Honour, MD  Multiple Vitamins-Minerals (PRESERVISION AREDS 2 PO) Take 2 tablets by mouth daily.     [provider]  nitroGLYCERIN (NITROSTAT) 0.4 MG SL tablet Place 0.4 mg under the tongue every 5 (five) minutes as needed for chest pain.    [provider]  ondansetron (ZOFRAN ODT) 4 MG disintegrating tablet Take 1 tablet (4 mg total) by mouth every 6 (six) hours as needed. 01/08/19   Ward, Delice Bison, DO  pilocarpine (PILOCAR) 4 % ophthalmic solution Place 1 drop into both eyes 3 (three) times daily.     [provider]  predniSONE (DELTASONE) 5 MG tablet Take 5 mg by mouth daily with breakfast.     [provider]  SEMAGLUTIDE,0.25 OR 0.5MG /DOS, [place] Inject 1.5 mLs into the skin once a week. 0.5mg /0.389ml    [provider]  Tamsulosin HCl (FLOMAX) 0.4 MG CAPS Take 0.4 mg by mouth 2 (two) times daily.     [provider]    Family History Family History  Problem Relation Age of Onset  . Hypertension Other   . Diabetes Brother   . Kidney  failure Brother     Social History Social History   Tobacco Use  . Smoking status: Former Smoker    Types: Cigarettes    Quit date: 03/16/1957    Years since quitting: 61.8  . Smokeless tobacco: Never Used  Substance Use Topics  . Alcohol use: No  . Drug use: No     Allergies   Ace inhibitors, Actonel [risedronate], Ciprocinonide [fluocinolone], Flunisolide, Metformin and related, Sertraline, Sulindac, and Terazosin   Review of Systems Review of Systems  Constitutional: Negative for chills and fever.  HENT: Negative for congestion, rhinorrhea and sore throat.   Eyes: Negative for visual disturbance.  Respiratory: Negative for cough and shortness of breath.   Cardiovascular: Negative for chest pain and leg swelling.  Gastrointestinal: Positive for nausea. Negative for abdominal pain, diarrhea and vomiting.  Genitourinary: Negative for dysuria.  Musculoskeletal: Negative for back pain and neck pain.  Skin: Negative for rash.  Neurological: Negative for dizziness, light-headedness and headaches.  Hematological: Does not bruise/bleed easily.  Psychiatric/Behavioral: Negative for confusion.     Physical Exam Updated Vital Signs BP (!) 170/91   Pulse 85   Temp 98.1 F (36.7 C) (Oral)   Resp 13   SpO2 95%   Physical Exam Vitals signs and nursing note reviewed.  Constitutional:      General: He is not in acute distress.    Appearance: He is well-developed.  HENT:     Head: Normocephalic and atraumatic.     Mouth/Throat:     Mouth: Mucous membranes are dry.  Eyes:     Extraocular Movements: Extraocular movements intact.     Conjunctiva/sclera: Conjunctivae normal.     Pupils: Pupils are equal, round, and reactive to light.  Neck:     Musculoskeletal: Normal range of motion and neck supple.  Cardiovascular:     Rate and Rhythm: Normal rate and regular rhythm.     Heart sounds: No murmur.  Pulmonary:     Effort: Pulmonary effort is normal. No respiratory  distress.     Breath sounds: Normal breath sounds.  Abdominal:  General: There is no distension.     Palpations: Abdomen is soft. There is no mass.     Tenderness: There is no abdominal tenderness. There is no guarding.     Hernia: No hernia is present.  Musculoskeletal: Normal range of motion.        General: No swelling.  Skin:    General: Skin is warm and dry.  Neurological:     General: No focal deficit present.     Mental Status: He is alert and oriented to person, place, and time.     Cranial Nerves: No cranial nerve deficit.     Sensory: No sensory deficit.     Motor: No weakness.      ED Treatments / Results  Labs (all labs ordered are listed, but only abnormal results are displayed) Labs Reviewed  COMPREHENSIVE METABOLIC PANEL - Abnormal; Notable for the following components:      Result Value   Potassium 2.8 (*)    Glucose, Bld 164 (*)    BUN 29 (*)    Creatinine, Ser 3.29 (*)    Calcium 8.6 (*)    Total Bilirubin 1.7 (*)    GFR calc non Af Amer 17 (*)    GFR calc Af Amer 20 (*)    All other components within normal limits  BRAIN NATRIURETIC PEPTIDE - Abnormal; Notable for the following components:   B Natriuretic Peptide 267.3 (*)    All other components within normal limits  SARS CORONAVIRUS 2 (TAT 6-24 HRS)  CBC WITH DIFFERENTIAL/PLATELET  LIPASE, BLOOD  URINALYSIS, ROUTINE W REFLEX MICROSCOPIC  COMPREHENSIVE METABOLIC PANEL  CBC  MAGNESIUM  BASIC METABOLIC PANEL    EKG None  Radiology Dg Chest Port 1 View  Result Date: 01/19/2019 CLINICAL DATA:  Failure to thrive EXAM: PORTABLE CHEST 1 VIEW COMPARISON:  Radiograph November 01, 2018, CT Jul 27, 2015 FINDINGS: Postsurgical changes related to prior CABG including intact and aligned sternotomy wires and multiple surgical clips projecting over the mediastinum. Stable cardiomegaly. Calcified aorta is again noted. Central pulmonary arterial enlargement is suggestive of chronic pulmonary artery  hypertension, better seen on comparison CTs. Few scattered calcified granulomata are again seen in the lungs. Hazy basilar atelectatic changes are noted. No convincing features of edema. No consolidative process, pneumothorax or effusion. Degenerative changes are present in the imaged spine and shoulders. IMPRESSION: 1. Atelectasis, otherwise no acute cardiopulmonary disease. 2. Postsurgical changes related to prior CABG. 3. Findings suggestive of chronic pulmonary artery hypertension, better seen on comparison CT. Electronically Signed   By: Lovena Le M.D.   On: 01/19/2019 15:30    Procedures Procedures (including critical care time)  CRITICAL CARE Performed by: Fredia Sorrow Total critical care time: 30 minutes Critical care time was exclusive of separately billable procedures and treating other patients. Critical care was necessary to treat or prevent imminent or life-threatening deterioration. Critical care was time spent personally by me on the following activities: development of treatment plan with patient and/or surrogate as well as nursing, discussions with consultants, evaluation of patient's response to treatment, examination of patient, obtaining history from patient or surrogate, ordering and performing treatments and interventions, ordering and review of laboratory studies, ordering and review of radiographic studies, pulse oximetry and re-evaluation of patient's condition.   Medications Ordered in ED Medications  0.9 %  sodium chloride infusion (has no administration in time range)  potassium chloride SA (KLOR-CON) CR tablet 40 mEq (has no administration in time range)  potassium chloride 10  mEq in 100 mL IVPB (has no administration in time range)  heparin injection 5,000 Units (has no administration in time range)  ondansetron (ZOFRAN) injection 4 mg (has no administration in time range)  0.9 % NaCl with KCl 40 mEq / L  infusion (has no administration in time range)   pantoprazole (PROTONIX) injection 40 mg (has no administration in time range)  apixaban (ELIQUIS) tablet 5 mg (has no administration in time range)  atorvastatin (LIPITOR) tablet 20 mg (has no administration in time range)  finasteride (PROSCAR) tablet 5 mg (has no administration in time range)  hydrALAZINE (APRESOLINE) tablet 25 mg (has no administration in time range)  latanoprost (XALATAN) 0.005 % ophthalmic solution 1 drop (has no administration in time range)  metoprolol tartrate (LOPRESSOR) tablet 100 mg (has no administration in time range)  pilocarpine (PILOCAR) 4 % ophthalmic solution 1 drop (has no administration in time range)  predniSONE (DELTASONE) tablet 5 mg (has no administration in time range)  tamsulosin (FLOMAX) capsule 0.4 mg (has no administration in time range)  sodium chloride 0.9 % bolus 500 mL (0 mLs Intravenous Stopped 01/19/19 1812)  ondansetron (ZOFRAN) injection 4 mg (4 mg Intravenous Given 01/19/19 1535)  fentaNYL (SUBLIMAZE) injection 12.5 mcg (12.5 mcg Intravenous Given 01/19/19 1601)     Initial Impression / Assessment and Plan / ED Course  I have reviewed the triage vital signs and the nursing notes.  Pertinent labs & imaging results that were available during my care of the patient were reviewed by me and considered in my medical decision making (see chart for details).        Patient's abdomen is soft and nontender.  Zofran to help control the vomiting but he is still having nausea.  Receiving IV fluids for hydration.  Mucous membranes were dry.  Patient's potassium here was 2.8.  And worsening of his creatinine.  BUN was not significantly elevated.  Patient received 10 mEq of IV potassium 40 mEq of p.o. potassium.  Covid testing ordered.  Patient will be admitted by the hospitalist team for hydration for the acute kidney injury and hypokalemia.  Clinically do not feel the patient requires CT scan of his abdomen his abdomen is completely nontender there has  been no diarrhea.  Final Clinical Impressions(s) / ED Diagnoses   Final diagnoses:  Dehydration  Intractable vomiting with nausea, unspecified vomiting type  Other chronic pain  Hypokalemia  AKI (acute kidney injury) [facility])    ED Discharge Orders    None       Fredia Sorrow, MD 01/19/19 [place]

## 2019-01-19 NOTE — ED Notes (Signed)
X-ray at bedside

## 2019-01-20 ENCOUNTER — Encounter (HOSPITAL_COMMUNITY): Payer: Self-pay

## 2019-01-20 ENCOUNTER — Other Ambulatory Visit: Payer: Self-pay

## 2019-01-20 DIAGNOSIS — G8929 Other chronic pain: Secondary | ICD-10-CM

## 2019-01-20 LAB — URINALYSIS, ROUTINE W REFLEX MICROSCOPIC
Bacteria, UA: NONE SEEN
Bilirubin Urine: NEGATIVE
Glucose, UA: NEGATIVE mg/dL
Hgb urine dipstick: NEGATIVE
Ketones, ur: 5 mg/dL — AB
Leukocytes,Ua: NEGATIVE
Nitrite: NEGATIVE
Protein, ur: 100 mg/dL — AB
Specific Gravity, Urine: 1.012 (ref 1.005–1.030)
pH: 6 (ref 5.0–8.0)

## 2019-01-20 LAB — COMPREHENSIVE METABOLIC PANEL
ALT: 17 U/L (ref 0–44)
AST: 14 U/L — ABNORMAL LOW (ref 15–41)
Albumin: 3.6 g/dL (ref 3.5–5.0)
Alkaline Phosphatase: 66 U/L (ref 38–126)
Anion gap: 12 (ref 5–15)
BUN: 25 mg/dL — ABNORMAL HIGH (ref 8–23)
CO2: 26 mmol/L (ref 22–32)
Calcium: 8.3 mg/dL — ABNORMAL LOW (ref 8.9–10.3)
Chloride: 106 mmol/L (ref 98–111)
Creatinine, Ser: 3.1 mg/dL — ABNORMAL HIGH (ref 0.61–1.24)
GFR calc Af Amer: 21 mL/min — ABNORMAL LOW (ref >60)
GFR calc non Af Amer: 18 mL/min — ABNORMAL LOW (ref >60)
Glucose, Bld: 109 mg/dL — ABNORMAL HIGH (ref 70–99)
Potassium: 3.3 mmol/L — ABNORMAL LOW (ref 3.5–5.1)
Sodium: 144 mmol/L (ref 135–145)
Total Bilirubin: 1.3 mg/dL — ABNORMAL HIGH (ref 0.3–1.2)
Total Protein: 6.4 g/dL — ABNORMAL LOW (ref 6.5–8.1)

## 2019-01-20 LAB — GLUCOSE, CAPILLARY
Glucose-Capillary: 126 mg/dL — ABNORMAL HIGH (ref 70–99)
Glucose-Capillary: 212 mg/dL — ABNORMAL HIGH (ref 70–99)
Glucose-Capillary: 211 mg/dL — ABNORMAL HIGH (ref 70–99)
Glucose-Capillary: 185 mg/dL — ABNORMAL HIGH (ref 70–99)

## 2019-01-20 LAB — CBC
HCT: 38.4 % — ABNORMAL LOW (ref 39.0–52.0)
Hemoglobin: 11.8 g/dL — ABNORMAL LOW (ref 13.0–17.0)
MCH: 30.3 pg (ref 26.0–34.0)
MCHC: 30.7 g/dL (ref 30.0–36.0)
MCV: 98.7 fL (ref 80.0–100.0)
Platelets: 191 10*3/uL (ref 150–400)
RBC: 3.89 MIL/uL — ABNORMAL LOW (ref 4.22–5.81)
RDW: 14.4 % (ref 11.5–15.5)
WBC: 7.1 10*3/uL (ref 4.0–10.5)
nRBC: 0 % (ref 0.0–0.2)

## 2019-01-20 LAB — MRSA PCR SCREENING: MRSA by PCR: POSITIVE — AB

## 2019-01-20 LAB — MAGNESIUM: Magnesium: 2.2 mg/dL (ref 1.7–2.4)

## 2019-01-20 LAB — SARS CORONAVIRUS 2 (TAT 6-24 HRS): SARS Coronavirus 2: NEGATIVE

## 2019-01-20 MED ORDER — ONDANSETRON HCL 4 MG/2ML IJ SOLN: 4.0000 mg | Freq: Four times a day (QID) | INTRAMUSCULAR | Status: DC

## 2019-01-20 MED ORDER — CHLORHEXIDINE GLUCONATE CLOTH 2 % EX PADS
6.0000 | MEDICATED_PAD | Freq: Every day | CUTANEOUS | Status: DC
  Administered 2019-01-20 – 2019-01-24 (×5): 6 via TOPICAL

## 2019-01-20 MED ORDER — MUPIROCIN 2 % EX OINT
1.0000 "application " | TOPICAL_OINTMENT | Freq: Two times a day (BID) | CUTANEOUS | Status: DC
  Administered 2019-01-20 – 2019-01-24 (×9): 1 via NASAL
  Filled 2019-01-20 (×4): qty 22

## 2019-01-20 MED ORDER — POTASSIUM CHLORIDE CRYS ER 20 MEQ PO TBCR
40.0000 meq | EXTENDED_RELEASE_TABLET | Freq: Once | ORAL | Status: AC
  Administered 2019-01-20: 40 meq via ORAL
  Filled 2019-01-20: qty 2

## 2019-01-20 MED ORDER — SODIUM CHLORIDE 0.9 % IV SOLN
INTRAVENOUS | Status: DC
  Administered 2019-01-20 – 2019-01-22 (×4): via INTRAVENOUS

## 2019-01-20 MED ORDER — PANTOPRAZOLE SODIUM 40 MG PO TBEC: 40.0000 mg | DELAYED_RELEASE_TABLET | Freq: Every day | ORAL | 1 refills | Status: DC

## 2019-01-20 MED ORDER — METOCLOPRAMIDE HCL 5 MG/ML IJ SOLN
5.0000 mg | Freq: Four times a day (QID) | INTRAMUSCULAR | Status: DC | PRN
  Administered 2019-01-20 – 2019-01-22 (×6): 5 mg via INTRAVENOUS
  Filled 2019-01-20 (×6): qty 2

## 2019-01-20 MED ORDER — GLUCERNA SHAKE PO LIQD
237.0000 mL | Freq: Three times a day (TID) | ORAL | Status: DC
  Administered 2019-01-20 – 2019-01-24 (×8): 237 mL via ORAL
  Filled 2019-01-20 (×14): qty 237

## 2019-01-20 NOTE — Evaluation (Signed)
Physical Therapy Evaluation Patient Details Name: Jacob [name] MRN: [id number] DOB: [birth year] Today's Date: 01/20/2019   History of Present Illness  78yo male c/o intractable nausea and vomiting of 2 weeks, light-headedness. PMH CHF, A-fib on Eliquis, CKD stage 4, DM with neuropathy, RA, COPD, sarcoidosis, kidney CA, CABGx7, HTN, obesity, chronic DOE, cardiac cath, lumbar surgery  Clinical Impression   Patient received in bed, pleasant and motivated to work with therapy. Able to complete bed mobility with S and extended time, functional transfers with MinA and RW, and gait approximately 75f with min guard with RW limited by back pain and general nausea. He was left up in the chair with chair alarm active and all needs otherwise met this morning. Currently recommending skilled HHPT services moving forward, encouraged patient to further explore possibility of PCA when talking to insurance but did educate they are commonly OOP.     Follow Up Recommendations Home health PT;Other (comment)(would also benefit from PCA)    Equipment Recommendations  3in1 (PT)    Recommendations for Other Services       Precautions / Restrictions Precautions Precautions: Fall;Other (comment) Precaution Comments: chronic back pain Restrictions Weight Bearing Restrictions: No      Mobility  Bed Mobility Overal bed mobility: Needs Assistance Bed Mobility: Supine to Sit     Supine to sit: Supervision     General bed mobility comments: S for safety, no physical assist given  Transfers Overall transfer level: Needs assistance Equipment used: Rolling walker (2 wheeled) Transfers: Sit to/from Stand Sit to Stand: Min assist         General transfer comment: light MinA to boost to full standing position, cues for hand placement  Ambulation/Gait Ambulation/Gait assistance: Min guard Gait Distance (Feet): 50 Feet Assistive device: Rolling walker (2 wheeled) Gait Pattern/deviations: Step-through  pattern;Decreased step length - right;Decreased step length - left;Decreased stride length;Decreased dorsiflexion - right;Decreased dorsiflexion - left;Trunk flexed Gait velocity: decreased   General Gait Details: gait distance limited by back pain and general nausea; slow but steady with RW  Stairs            Wheelchair Mobility    Modified Rankin (Stroke Patients Only)       Balance Overall balance assessment: Needs assistance Sitting-balance support: Bilateral upper extremity supported;Feet supported Sitting balance-Leahy Scale: Normal     Standing balance support: Bilateral upper extremity supported;During functional activity Standing balance-Leahy Scale: Fair Standing balance comment: reliant on B UE support                             Pertinent Vitals/Pain Pain Assessment: 0-10 Pain Score: 6  Pain Location: back pain Pain Descriptors / Indicators: Aching;Sharp Pain Intervention(s): Limited activity within patient's tolerance;Monitored during session    [place] to be discharged to:: Private residence Living Arrangements: Spouse/significant other(is at the point she cannot help with anything) Available Help at Discharge: Family;Available 24 hours/day Type of Home: Apartment Home Access: Level entry     Home Layout: One level Home Equipment: Walker - 2 wheels;Walker - 4 wheels;Cane - single point;Tub bench;Wheelchair - manual;Hospital bed      Prior Function Level of Independence: Independent with assistive device(s)         Comments: independent with RW but has been having some trouble     Hand Dominance   Dominant Hand: Right    Extremity/Trunk Assessment   Upper Extremity Assessment Upper Extremity Assessment: Generalized weakness  Lower Extremity Assessment Lower Extremity Assessment: Generalized weakness    Cervical / Trunk Assessment Cervical / Trunk Assessment: Kyphotic  Communication    Communication: HOH  Cognition Arousal/Alertness: Awake/alert Behavior During Therapy: WFL for tasks assessed/performed Overall Cognitive Status: Within Functional Limits for tasks assessed                                 General Comments: good insight into current deficits and need for ongoing rehab      General Comments      Exercises     Assessment/Plan    PT Assessment Patient needs continued PT services  PT Problem List Decreased strength;Decreased activity tolerance;Decreased balance;Pain;Decreased mobility;Decreased coordination       PT Treatment Interventions DME instruction;Balance training;Gait training;Neuromuscular re-education;Stair training;Functional mobility training;Patient/family education;Therapeutic activities;Therapeutic exercise    PT Goals (Current goals can be found in the Care Plan section)  Acute Rehab PT Goals Patient Stated Goal: less back pain, feel stronger PT Goal Formulation: With patient Time For Goal Achievement: 02/03/19 Potential to Achieve Goals: Good    Frequency Min 3X/week   Barriers to discharge        Co-evaluation               AM-PAC PT "6 Clicks" Mobility  Outcome Measure Help needed turning from your back to your side while in a flat bed without using bedrails?: A Little Help needed moving from lying on your back to sitting on the side of a flat bed without using bedrails?: A Little Help needed moving to and from a bed to a chair (including a wheelchair)?: A Little Help needed standing up from a chair using your arms (e.g., wheelchair or bedside chair)?: A Little Help needed to walk in hospital room?: A Little Help needed climbing 3-5 steps with a railing? : A Little 6 Click Score: 18    End of Session Equipment Utilized During Treatment: Gait belt Activity Tolerance: Patient limited by pain;Other (comment)(limited by back pain and nausea) Patient left: in chair;with call bell/phone within reach;with  chair alarm set   PT Visit Diagnosis: Difficulty in walking, not elsewhere classified (R26.2);Unsteadiness on feet (R26.81);Muscle weakness (generalized) (M62.81);Pain Pain - Right/Left: (back pain) Pain - part of body: (back pain)    Time: 9833-8250 PT Time Calculation (min) (ACUTE ONLY): 24 min   Charges:   PT Evaluation $PT Eval Low Complexity: 1 Low PT Treatments $Gait Training: 8-22 mins        Windell Norfolk, DPT, CBIS  Supplemental Physical Therapist [place]    Pager [phone] Acute Rehab Office [phone]

## 2019-01-20 NOTE — Patient Outreach (Signed)
  [facility]) Care Management Chronic Special Needs Program    01/20/2019  Name: YVON MCCORD, DOB: Jun 12, 1940  MRN: [id number]   Mr. Jonahtan Manseau is enrolled in a chronic special needs plan for Diabetes and Heart FailureClient [facility] for nausea and vomiting and dehydration.. Individualized care plan sent to [facility] for admission. Care plan developed based upon HRA and available data.   PLAN: RNCM will continue to follow.   Quinn Plowman RN,BSN,CCM [facility] Telephonic  [phone]

## 2019-01-20 NOTE — Consult Note (Signed)
[facility] Consultation Note  Referring Provider: [facility] Primary Care Physician:  Gifford Shave, MD  Reason for Consultation:  Nausea and vomiting  HPI: Jacob [name] is a 78 y.o. male whom I was asked to see for nausea and vomiting.  He has chronic, over one year, history of "foamy" vomit and nausea.  Has longstanding back pain on chronic narcotics, as well as diabetes.  Couple weeks ago, his back pain regimen was adjusted, and new medication (muscle relaxant?) was added.  Since that time, patient developed profound nausea and vomiting.  Nausea and vomiting can be while eating or while fasting.  No hematemesis, dysphagia, abdominal pain, blood in stool, change in bowel habits, unintentional weight loss.  Reports most of his care at [facility], and reports having endoscopy and colonoscopy within past 10 and 5 years, respectively, unclear on results.  While having full liquid lunch, patient reported nausea and vomiting; emesis was more foamy material and not consistent with food or bile.   Past Medical History:  Diagnosis Date  . Allergy   . Anemia   . Anxiety   . Arthritis   . Asthma   . BPH (benign prostatic hyperplasia)   . Cancer of kidney ([place])   . Cataract   . CHF (congestive heart failure) ([place])   . Chronic kidney disease    chronic  kidney failure  kidney function at 42%  . COPD (chronic obstructive pulmonary disease) ([place])   . Coronary artery disease    CABG  7 bypasses  . Diabetes mellitus without complication ([place])   . Facial cellulitis   . GERD (gastroesophageal reflux disease)   . Gout   . Hepatitis    many years ago  . Hyperlipidemia   . Hypertension   . Lumbar disc disease   . Obesity   . Paroxysmal atrial fibrillation (HCC)   . Peripheral neuropathy   . Polymyalgia rheumatica (HCC)    maintained on Prednisone, Plaquenil. Followed by rhuematology every 4 months/[name].  . Shortness of breath dyspnea    with exertion  . Sleep apnea    CPAP    Trying to use    Past Surgical History:  Procedure Laterality Date  . APPENDECTOMY    . CARDIAC CATHETERIZATION  1998, 2000   left  . CATARACT EXTRACTION W/PHACO Right 11/28/2014   Procedure: CATARACT EXTRACTION PHACO AND INTRAOCULAR LENS PLACEMENT ([place]) RIGHT ;  Surgeon: Marylynn Pearson, MD;  Location: [place] [facility];  Service: Ophthalmology;  Laterality: Right;  . CORONARY ARTERY BYPASS GRAFT  03/17/1995   Wynonia Lawman; followed every six months.  Marland Kitchen HERNIA REPAIR    . LUMBAR LAMINECTOMY/DECOMPRESSION MICRODISCECTOMY N/A 07/30/2015   Procedure: LUMBAR LAMINECTOMY DISCECTOMY ;  Surgeon: Ashok Pall, MD;  Location: [facility];  Service: Neurosurgery;  Laterality: N/A;  LUMBAR LAMINECTOMY DISCECTOMY   . LUMBAR LAMINECTOMY/DECOMPRESSION MICRODISCECTOMY N/A 09/06/2015   Procedure: Redo L3/4 Disectomy;  Surgeon: Ashok Pall, MD;  Location: [facility];  Service: Neurosurgery;  Laterality: N/A;  . PROSTATE SURGERY     TURP at [place].  Marland Kitchen TRANSURETHRAL RESECTION OF PROSTATE      Prior to Admission medications   Medication Sig Start Date End Date Taking? Authorizing Provider  amLODipine (NORVASC) 10 MG tablet TAKE 1 TABLET BY MOUTH EVERY DAY Patient taking differently: Take 10 mg by mouth daily.  11/09/18  Yes Richardo Priest, MD  apixaban (ELIQUIS) 5 MG TABS tablet Take 5 mg by mouth 2 (two) times daily.   Yes [provider]  atorvastatin (LIPITOR) 20 MG tablet Take 20mg  (1 tab) 2 days a week Patient taking differently: Take 20 mg by mouth See admin instructions. Take 20mg  (1 tab) 2 days a week on Sundays and Wednesdays 08/23/18  Yes Munley, Hilton Cork, MD  budesonide-formoterol [facility]) 160-4.5 MCG/ACT inhaler Inhale 2 puffs into the lungs 2 (two) times daily.   Yes [provider]  calcitRIOL (ROCALTROL) 0.25 MCG capsule Take 0.25 mcg by mouth every Monday, Wednesday, and Friday.   Yes [provider]  cyanocobalamin 1000 MCG tablet Take 1,000 mcg by mouth daily.   Yes [provider]  denosumab (PROLIA) 60 MG/ML SOSY injection Inject 60 mg into the skin every 6 (six) months.   Yes [provider]  ferrous sulfate 325 (65 FE) MG tablet Take 325 mg by mouth as directed. MWF   Yes [provider]  finasteride (PROSCAR) 5 MG tablet Take 5 mg by mouth daily.    Yes [provider]  furosemide (LASIX) 40 MG tablet Take 1 tablet (40 mg total) by mouth 2 (two) times daily. 11/14/18  Yes Rory Percy, DO  hydrALAZINE (APRESOLINE) 25 MG tablet Take 25 mg by mouth 2 (two) times daily.   Yes [provider]  insulin lispro protamine-lispro (HUMALOG 75/25 MIX) (75-25) 100 UNIT/ML SUSP injection Inject into the skin 2 (two) times daily with a meal. See sliding scale instructions.   Yes [provider]  latanoprost (XALATAN) 0.005 % ophthalmic solution Place 1 drop into both eyes at bedtime.   Yes [provider]  loratadine (CLARITIN) 10 MG tablet Take 10 mg by mouth daily.   Yes [provider]  methocarbamol (ROBAXIN) 500 MG tablet Take 1 tablet (500 mg total) by mouth every 8 (eight) hours as needed for muscle spasms. 01/08/19  Yes Ward, Delice Bison, DO  metoprolol tartrate (LOPRESSOR) 100 MG tablet Take 100 mg by mouth 2 (two) times daily.   Yes [provider]  montelukast (SINGULAIR) 10 MG tablet Take 1 tablet (10 mg total) by mouth at bedtime. 07/27/13  Yes Wardell Honour, MD  Multiple Vitamins-Minerals (PRESERVISION AREDS 2 PO) Take 1 tablet by mouth 2 (two) times daily.    Yes [provider]  nitroGLYCERIN (NITROSTAT) 0.4 MG SL tablet Place 0.4 mg under the tongue every 5 (five) minutes as needed for chest pain.   Yes [provider]  ondansetron (ZOFRAN ODT) 4 MG disintegrating tablet Take 1 tablet (4 mg total) by mouth every 6 (six) hours as needed. 01/08/19  Yes Ward, Delice Bison, DO  pilocarpine (PILOCAR) 4 % ophthalmic solution Place 1 drop into both eyes 2 (two) times daily.     Yes [provider]  predniSONE (DELTASONE) 5 MG tablet Take 5 mg by mouth daily with breakfast.    Yes [provider]  SEMAGLUTIDE,0.25 OR 0.5MG /DOS, [place] Inject 1.5 mLs into the skin once a week. 0.5mg /0.334ml   Yes [provider]  Tamsulosin HCl (FLOMAX) 0.4 MG CAPS Take 0.8 mg by mouth at bedtime.    Yes [provider]  pantoprazole (PROTONIX) 40 MG tablet Take 1 tablet (40 mg total) by mouth daily. 01/20/19 01/20/20  Georgette Shell, MD    Current Facility-Administered Medications  Medication Dose Route Frequency Provider Last Rate Last Dose  . 0.9 %  sodium chloride infusion   Intravenous Continuous Georgette Shell, MD      . apixaban [facility]) tablet 5 mg  5 mg  Oral BID Georgette Shell, MD   5 mg at 01/20/19 1062  . [START ON 01/23/2019] atorvastatin (LIPITOR) tablet 20 mg  20 mg Oral Once per day on Mon Wed Mathews, Elizabeth G, MD      . Chlorhexidine Gluconate Cloth 2 % PADS 6 each  6 each Topical Daily Georgette Shell, MD   6 each at 01/20/19 1235  . feeding supplement (GLUCERNA SHAKE) (GLUCERNA SHAKE) liquid 237 mL  237 mL Oral TID BM Georgette Shell, MD   237 mL at 01/20/19 1314  . finasteride (PROSCAR) tablet 5 mg  5 mg Oral QHS Georgette Shell, MD      . hydrALAZINE (APRESOLINE) tablet 25 mg  25 mg Oral BID Georgette Shell, MD   25 mg at 01/20/19 6948  . HYDROmorphone (DILAUDID) injection 0.5 mg  0.5 mg Intravenous Q4H PRN Georgette Shell, MD   0.5 mg at 01/20/19 1031  . insulin aspart (novoLOG) injection 0-5 Units  0-5 Units Subcutaneous QHS Georgette Shell, MD      . insulin aspart (novoLOG) injection 0-9 Units  0-9 Units Subcutaneous TID WC Georgette Shell, MD   3 Units at 01/20/19 1234  . latanoprost (XALATAN) 0.005 % ophthalmic solution 1 drop  1 drop Both Eyes QHS Georgette Shell, MD   1 drop at 01/19/19 2301  . metoprolol tartrate (LOPRESSOR) tablet 100 mg  100 mg Oral BID Georgette Shell, MD   100 mg at 01/20/19 5462  . mupirocin ointment (BACTROBAN) 2 % 1 application  1 application Nasal BID Georgette Shell, MD   1 application at 70/35/00 1234  . ondansetron (ZOFRAN) injection 4 mg  4 mg Intravenous Q6H PRN Georgette Shell, MD   4 mg at 01/20/19 1314  . pantoprazole (PROTONIX) injection 40 mg  40 mg Intravenous Q12H Georgette Shell, MD   40 mg at 01/20/19 9381  . pilocarpine (PILOCAR) 4 % ophthalmic solution 1 drop  1 drop Both Eyes TID Georgette Shell, MD   1 drop at 01/20/19 8299  . predniSONE (DELTASONE) tablet 5 mg  5 mg Oral Q breakfast Georgette Shell, MD   5 mg at 01/20/19 3716  . tamsulosin (FLOMAX) capsule 0.4 mg  0.4 mg Oral BID Georgette Shell, MD   0.4 mg at 01/20/19 9678    Allergies as of 01/19/2019 - Review Complete 01/19/2019  Allergen Reaction Noted  . Ace inhibitors Other (See Comments) 04/13/2012  . Actonel [risedronate] Nausea And Vomiting 07/25/2015  . Ciprocinonide [fluocinolone] Other (See Comments) 04/13/2012  . Flunisolide Other (See Comments) 04/13/2012  . Metformin and related Other (See Comments) 12/19/2011  . Sertraline Other (See Comments) 04/13/2012  . Sulindac Other (See Comments) 04/13/2012  . Terazosin Other (See Comments) 04/13/2012    Family History  Problem Relation Age of Onset  . Hypertension Other   . Diabetes Brother   . Kidney failure Brother     Social History   Socioeconomic History  . Marital status: Married    Spouse name: Not on file  . Number of children: Not on file  . Years of education: Not on file  . Highest education level: Not on file  Occupational History  . Occupation: Diplomatic Services operational officer  . Financial resource strain: Not on file  . Food insecurity    Worry: Not on file    Inability: Not on file  . Transportation needs    Medical: Not  on file    Non-medical: Not on file  Tobacco Use  . Smoking status: Former Smoker    Types: Cigarettes    Quit date:  03/16/1957    Years since quitting: 61.8  . Smokeless tobacco: Never Used  Substance and Sexual Activity  . Alcohol use: No  . Drug use: No  . Sexual activity: Not Currently    Birth control/protection: None  Lifestyle  . Physical activity    Days per week: Not on file    Minutes per session: Not on file  . Stress: Not on file  Relationships  . Social Herbalist on phone: Not on file    Gets together: Not on file    Attends religious service: Not on file    Active member of club or organization: Not on file    Attends meetings of clubs or organizations: Not on file    Relationship status: Not on file  . Intimate partner violence    Fear of current or ex partner: Not on file    Emotionally abused: Not on file    Physically abused: Not on file    Forced sexual activity: Not on file  Other Topics Concern  . Not on file  Social History Narrative   Marital status: Married x 55 years.      Children: 3 children; 5 grandchildren; 6 gg.      Lives: with wife.        Employment: retired; [employer].  Working every other week; 45 hours.      Tobacco:  Quit at age 49.      Alcohol:  None      Exercise: none      ADLs: indepedent with ADLs;  Uses cane and has a walker.  Drives.      Advanced Directives:  +Living Will.  DNR/DNI.  Wife is HCPOA.   Right hand   One story    Review of Systems: As per HPI, all others negative  Physical Exam: Vital signs in last 24 hours: Temp:  [97.9 F (36.6 C)-98.2 F (36.8 C)] 98.2 F (36.8 C) (11/06 1331) Pulse Rate:  [68-86] 68 (11/06 1331) Resp:  [13-18] 18 (11/06 1331) BP: (141-170)/(70-91) 146/76 (11/06 1331) SpO2:  [91 %-97 %] 96 % (11/06 1331) Weight:  [95.6 kg] 95.6 kg (11/05 2348) Last BM Date: 01/17/19 General:   Alert, overweight, pleasant and cooperative in NAD Head:  Normocephalic and atraumatic. Eyes:  Sclera clear, no icterus.   Conjunctiva pink. Ears:  Normal auditory acuity. Nose:  No  deformity, discharge,  or lesions. Mouth:  No deformity or lesions.  Oropharynx pink & moist. Neck:  Supple; no masses or thyromegaly. Lungs:  Clear throughout to auscultation.   No wheezes, crackles, or rhonchi. No acute distress. Heart:  Regular rate and rhythm; no murmurs, clicks, rubs,  or gallops. Abdomen:  Soft, protuberant, nontender and nondistended. No masses, hepatosplenomegaly or hernias noted. Normal bowel sounds, without guarding, and without rebound.     Msk:  Symmetrical without gross deformities. Normal posture. Pulses:  Normal pulses noted. Extremities:  Without clubbing or edema. Neurologic:  Alert and  oriented x4;  grossly normal neurologically. Skin:  Scattered diffuse ecchymoses, especially of upper extremities; otherwise intact without significant lesions or rashes. Psych:  Alert and cooperative. Normal mood and affect.   Lab Results: Recent Labs    01/19/19 1514 01/20/19 0540  WBC 6.7 7.1  HGB 13.2 11.8*  HCT 40.9  38.4*  PLT 179 191   BMET Recent Labs    01/19/19 1514 01/20/19 0540  NA 142 144  K 2.8* 3.3*  CL 101 106  CO2 28 26  GLUCOSE 164* 109*  BUN 29* 25*  CREATININE 3.29* 3.10*  CALCIUM 8.6* 8.3*   LFT Recent Labs    01/20/19 0540  PROT 6.4*  ALBUMIN 3.6  AST 14*  ALT 17  ALKPHOS 66  BILITOT 1.3*   PT/INR No results for input(s): LABPROT, INR in the last 72 hours.  Studies/Results: Dg Chest Port 1 View  Result Date: 01/19/2019 CLINICAL DATA:  Failure to thrive EXAM: PORTABLE CHEST 1 VIEW COMPARISON:  Radiograph November 01, 2018, CT Jul 27, 2015 FINDINGS: Postsurgical changes related to prior CABG including intact and aligned sternotomy wires and multiple surgical clips projecting over the mediastinum. Stable cardiomegaly. Calcified aorta is again noted. Central pulmonary arterial enlargement is suggestive of chronic pulmonary artery hypertension, better seen on comparison CTs. Few scattered calcified granulomata are again seen in the  lungs. Hazy basilar atelectatic changes are noted. No convincing features of edema. No consolidative process, pneumothorax or effusion. Degenerative changes are present in the imaged spine and shoulders. IMPRESSION: 1. Atelectasis, otherwise no acute cardiopulmonary disease. 2. Postsurgical changes related to prior CABG. 3. Findings suggestive of chronic pulmonary artery hypertension, better seen on comparison CT. Electronically Signed   By: Lovena Le M.D.   On: 01/19/2019 15:30    Impression:  1.  Refractory nausea and vomiting.  Acutely worse over past couple weeks after adding new medications to his regimen for back pain.  Has chronic baseline "foamy" emesis and nausea.  Suspect patient's symptoms are at least in large part gastroparesis-mediated, which in turn is likely multifactorial (diabetes, narcotics and other medication effects), as well as some likely underlying GERD (which could be primary, or in turn secondary to gastroparesis as well). 2.  Multiple medical problems (CKD, DM with neuropathy, HTN, heart failure). 3.  Atrial fibrillation on chronic anticoagulation (apixaban).  Plan:  1.  Will initiate scheduled (not prn) antiemetics, continue PPI and continue full liquid diet. 2.  Minimize narcotics and other antimotility agents, as clinically feasible. 3.  Revisit symptoms tomorrow; if things don't improve over the next day or two, we'll need to hold patient's apixaban with consideration of endoscopy Monday.  If things do improve, then patient can likely be discharged home in the next day or two. 4.  [facility] will follow.   LOS: 1 day   Eunice Oldaker M  01/20/2019, 4:05 PM  Cell [phone] If no answer or after 5 PM call [phone]

## 2019-01-20 NOTE — Progress Notes (Signed)
Noted possible ST elevation and first degree bundle branch block in notes- discussed case with attending MD, who reports patient is OK to participate in PT. Will continue with eval as planned.   Windell Norfolk, DPT, CBIS  Supplemental Physical Therapist [facility]    Pager [phone] Acute Rehab Office [phone]

## 2019-01-20 NOTE — Progress Notes (Signed)
Notified by tele that patient had questionable ST elevation but has now shifted to blocked PAC- 1st degree bundle branch block. Patient asymptomatic. MD on call notified. No orders. Will continue to monitor.

## 2019-01-20 NOTE — Progress Notes (Signed)
PROGRESS NOTE    LORI LIEW  FUX:[id number] DOB: April 18, 1940 DOA: 01/19/2019 PCP: Gifford Shave, MD  Brief Narrative:  78 y.o. male with medical history significant of diastolic heart failure, paroxysmal atrial fibrillation on Eliquis, CKD stage IV, type 2 diabetes with neuropathy, rheumatoid arthritis, COPD, sarcoidosis, obstructive sleep apnea and BPH admitted with complaints of intractable nausea and vomiting for the last 2 weeks.  Denies any diarrhea or abdominal pain.  Denies any hematemesis melena.  Wife reports that he is very lightheaded and unable to stand up.  Denies headache or changes with his vision.  Denies sick contacts.  Patient headache Covid test 3 weeks ago which was negative.  Denies chest pain shortness of breath cough.  He [facility]  He has no history of gastric ulcer or gastritis. He has chronic back pain radiates to his right leg followed by neurology for lumbar radiculopathy.    Assessment & Plan:   Active Problems:   Intractable nausea and vomiting   #1 intractable nausea and vomiting of unclear etiology patient admitted with 2-week history of intractable nausea vomiting unable to keep anything down associated with lightheadedness. Continue Zofran and IV Protonix twice a day.  Discussed with GI Dr. Paulita Fujita who will see the patient. Continue IV fluids cautiously. Patient reports lightheadedness with standing.  #2 hypokalemia with CKD stage IV replete  #3 history of stage IV CKD creatinine slightly higher than his normal baseline at the time of admission creatinine 3.29 he mostly runs around 2.97.  Creatinine improving but not yet back to baseline.  Continue IV fluids slowly Lasix being on hold.   #4 dehydration patient is orthostatic continue IV fluids  #5 hypertension restart home medications hold Lasix.  Blood pressure 146/76.  #6 type 2 diabetes with neuropathy SSI hold long-acting insulin  #7 history  of heart failure with preserved ejection fraction monitor closely hold Lasix  Rehydrate cautiously.    Nutrition Problem: Inadequate oral intake Etiology: nausea, vomiting     Signs/Symptoms: per patient/family report    Interventions: Glucerna shake  Estimated body mass index is 29.4 kg/m as calculated from the following:   Height as of this encounter: 5\' 11"  (1.803 m).   Weight as of this encounter: 95.6 kg.  DVT prophylaxis: eliquis Code Status:full  Family Communication:dw daughter debbie Disposition Plan:pending clinical improvement    Consultants:   GI  Procedures: None Antimicrobials: None  Subjective: Patient continues to have nausea and vomiting not able to tolerate p.o. intake  Not able to keep anything down  Objective: Vitals:   01/19/19 2026 01/19/19 2348 01/20/19 0523 01/20/19 1331  BP: (!) 141/70  (!) 147/75 (!) 146/76  Pulse: 86  76 68  Resp: 16  16 18   Temp: 97.9 F (36.6 C)  98.1 F (36.7 C) 98.2 F (36.8 C)  TempSrc:    Oral  SpO2: 97%  94% 96%  Weight:  95.6 kg    Height:  5\' 11"  (1.803 m)      Intake/Output Summary (Last 24 hours) at 01/20/2019 1407 Last data filed at 01/20/2019 0900 Gross per 24 hour  Intake 868.4 ml  Output 0 ml  Net 868.4 ml   Filed Weights   01/19/19 2348  Weight: 95.6 kg    Examination:  General exam: Appears in mild distress due to nausea and vomiting Respiratory system: Clear to auscultation. Respiratory effort normal. Cardiovascular system: S1 & S2 heard, RRR. No JVD, murmurs,  rubs, gallops or clicks. No pedal edema. Gastrointestinal system: Abdomen is nondistended, soft and nontender. No organomegaly or masses felt. Normal bowel sounds heard. Central nervous system: Alert and oriented. No focal neurological deficits. Extremities: Trace edema. Skin: No rashes, lesions or ulcers Psychiatry: Judgement and insight appear normal. Mood & affect appropriate.     Data Reviewed: I have personally  reviewed following labs and imaging studies  CBC: Recent Labs  Lab 01/19/19 1514 01/20/19 0540  WBC 6.7 7.1  NEUTROABS 4.9  --   HGB 13.2 11.8*  HCT 40.9 38.4*  MCV 96.2 98.7  PLT 179 332   Basic Metabolic Panel: Recent Labs  Lab 01/19/19 1514 01/20/19 0540  NA 142 144  K 2.8* 3.3*  CL 101 106  CO2 28 26  GLUCOSE 164* 109*  BUN 29* 25*  CREATININE 3.29* 3.10*  CALCIUM 8.6* 8.3*  MG  --  2.2   GFR: Estimated Creatinine Clearance: 22.8 mL/min (A) (by C-G formula based on SCr of 3.1 mg/dL (H)). Liver Function Tests: Recent Labs  Lab 01/19/19 1514 01/20/19 0540  AST 18 14*  ALT 19 17  ALKPHOS 71 66  BILITOT 1.7* 1.3*  PROT 6.7 6.4*  ALBUMIN 3.9 3.6   Recent Labs  Lab 01/19/19 1514  LIPASE 26   No results for input(s): AMMONIA in the last 168 hours. Coagulation Profile: No results for input(s): INR, PROTIME in the last 168 hours. Cardiac Enzymes: No results for input(s): CKTOTAL, CKMB, CKMBINDEX, TROPONINI in the last 168 hours. BNP (last 3 results) Recent Labs    08/23/18 1058  PROBNP 3,661*   HbA1C: No results for input(s): HGBA1C in the last 72 hours. CBG: Recent Labs  Lab 01/19/19 2029 01/20/19 0801 01/20/19 1222  GLUCAP 138* 126* 212*   Lipid Profile: No results for input(s): CHOL, HDL, LDLCALC, TRIG, CHOLHDL, LDLDIRECT in the last 72 hours. Thyroid Function Tests: No results for input(s): TSH, T4TOTAL, FREET4, T3FREE, THYROIDAB in the last 72 hours. Anemia Panel: No results for input(s): VITAMINB12, FOLATE, FERRITIN, TIBC, IRON, RETICCTPCT in the last 72 hours. Sepsis Labs: No results for input(s): PROCALCITON, LATICACIDVEN in the last 168 hours.  Recent Results (from the past 240 hour(s))  SARS CORONAVIRUS 2 (TAT 6-24 HRS) Nasopharyngeal Nasopharyngeal Swab     Status: None   Collection Time: 01/19/19  7:41 PM   Specimen: Nasopharyngeal Swab  Result Value Ref Range Status   SARS Coronavirus 2 NEGATIVE NEGATIVE Final    Comment:  (NOTE) SARS-CoV-2 target nucleic acids are NOT DETECTED. The SARS-CoV-2 RNA is generally detectable in upper and lower respiratory specimens during the acute phase of infection. Negative results do not preclude SARS-CoV-2 infection, do not rule out co-infections with other pathogens, and should not be used as the sole basis for treatment or other patient management decisions. Negative results must be combined with clinical observations, patient history, and epidemiological information. The expected result is Negative. Fact Sheet for Patients: SugarRoll.be Fact Sheet for Healthcare Providers: https://www.woods-.com/ This test is not yet approved or cleared by the [place] FDA and  has been authorized for detection and/or diagnosis of SARS-CoV-2 by FDA under an Emergency Use Authorization (EUA). This EUA will remain  in effect (meaning this test can be used) for the duration of the COVID-19 declaration under Section 56 4(b)(1) of the Act, 21 U.S.C. section 360bbb-3(b)(1), unless the authorization is terminated or revoked sooner. Performed at [facility], [place] [address], [place], [place] [address]   MRSA PCR  Screening     Status: Abnormal   Collection Time: 01/20/19 10:26 AM   Specimen: Nasal Mucosa; Nasopharyngeal  Result Value Ref Range Status   MRSA by PCR POSITIVE (A) NEGATIVE Final    Comment:        The GeneXpert MRSA Assay (FDA approved for NASAL specimens only), is one component of a comprehensive MRSA colonization surveillance program. It is not intended to diagnose MRSA infection nor to guide or monitor treatment for MRSA infections. RESULT CALLED TO, READ BACK BY AND VERIFIED WITH: DUMAS,N. RN @1228  ON 11.06.2020 BY COHEN,K Performed at [facility], [place] [address], [place], [place] [address]          Radiology Studies: Dg Chest Port 1 View  Result Date: 01/19/2019 CLINICAL DATA:   Failure to thrive EXAM: PORTABLE CHEST 1 VIEW COMPARISON:  Radiograph November 01, 2018, CT Jul 27, 2015 FINDINGS: Postsurgical changes related to prior CABG including intact and aligned sternotomy wires and multiple surgical clips projecting over the mediastinum. Stable cardiomegaly. Calcified aorta is again noted. Central pulmonary arterial enlargement is suggestive of chronic pulmonary artery hypertension, better seen on comparison CTs. Few scattered calcified granulomata are again seen in the lungs. Hazy basilar atelectatic changes are noted. No convincing features of edema. No consolidative process, pneumothorax or effusion. Degenerative changes are present in the imaged spine and shoulders. IMPRESSION: 1. Atelectasis, otherwise no acute cardiopulmonary disease. 2. Postsurgical changes related to prior CABG. 3. Findings suggestive of chronic pulmonary artery hypertension, better seen on comparison CT. Electronically Signed   By: Lovena Le M.D.   On: 01/19/2019 15:30        Scheduled Meds: . apixaban  5 mg Oral BID  . [START ON 01/23/2019] atorvastatin  20 mg Oral Once per day on Mon Wed  . Chlorhexidine Gluconate Cloth  6 each Topical Daily  . feeding supplement (GLUCERNA SHAKE)  237 mL Oral TID BM  . finasteride  5 mg Oral QHS  . hydrALAZINE  25 mg Oral BID  . insulin aspart  0-5 Units Subcutaneous QHS  . insulin aspart  0-9 Units Subcutaneous TID WC  . latanoprost  1 drop Both Eyes QHS  . metoprolol tartrate  100 mg Oral BID  . mupirocin ointment  1 application Nasal BID  . pantoprazole (PROTONIX) IV  40 mg Intravenous Q12H  . pilocarpine  1 drop Both Eyes TID  . predniSONE  5 mg Oral Q breakfast  . tamsulosin  0.4 mg Oral BID   Continuous Infusions: . sodium chloride    . 0.9 % NaCl with KCl 40 mEq / L 100 mL/hr (01/20/19 0927)     LOS: 1 day      Georgette Shell, MD [facility]   If 7PM-7AM, please contact night-coverage [email or web address] Password [email or web address]  01/20/2019, 2:07 PM

## 2019-01-20 NOTE — Progress Notes (Signed)
Initial Nutrition Assessment  DOCUMENTATION CODES:   Not applicable  INTERVENTION:   - Glucerna Shake po TID, each supplement provides 220 kcal and 10 grams of protein  - d/c Ensure Enlive  NUTRITION DIAGNOSIS:   Inadequate oral intake related to nausea, vomiting as evidenced by per patient/family report.  GOAL:   Patient will meet greater than or equal to 90% of their needs  MONITOR:   PO intake, Supplement acceptance, Diet advancement, Labs, Weight trends  REASON FOR ASSESSMENT:   Malnutrition Screening Tool    ASSESSMENT:   78 year old male who presented to the ED on 11/05 with weakness, N/V. PMH of CHF, atrial fibrillation, CKD stage IV, T2DM, COPD, sarcoidosis.   Spoke with pt at bedside. Pt sleeping at time of RD visit but awoke to RD voice.  Pt reports little PO intake x 2 weeks PTA. Pt states he was able to eat applesauce and graham crackers and drink some fluids but that everything he consumed "came back up."  Pt states that he is now feeling better and consumed 100% of some broth this AM without emesis. Pt states that he had a few burps after eating but has not had any N/V.  When asked about oral nutrition supplements, pt reports that he has a "protein problem." When asked to explain further, pt reports that his kidneys are bad and his protein levels are too high. RD discussed importance of adequate protein in maintaining lean muscle mass rather than trying to avoid protein altogether. Pt expresses understanding. Pt is willing to drink some shakes during admission to aid in meeting kcal needs.  Unable to obtain further history as multiple RNs entered room and began preparing pt for Foley insertion.  Reviewed weight history in chart. Noted pt with a weight loss of 11 kg since 12/12/18. This is a 10.3% weight loss in less than 2 months which is significant for timeframe. Unable to ask pt about weight loss. If this is true weight loss rather than fluid loss, pt is at  risk for malnutrition.  Medications reviewed and include: Ensure Enlive BID, SSI, Protonix, Prednisone IVF: NS with KCl @ 100 ml/hr  Labs reviewed: potassium 3.3 CBG's: 126, 138  NUTRITION - FOCUSED PHYSICAL EXAM:  Unable to complete at this time. Pt about to have Foley placed.  Diet Order:   Diet Order            Diet Carb Modified Fluid consistency: Thin; Room service appropriate? Yes  Diet effective now              EDUCATION NEEDS:   Education needs have been addressed  Skin:  Skin Assessment: Reviewed RN Assessment  Last BM:  01/17/19  Height:   Ht Readings from Last 1 Encounters:  01/19/19 5\' 11"  (1.803 m)    Weight:   Wt Readings from Last 1 Encounters:  01/19/19 95.6 kg    Ideal Body Weight:  78.2 kg  BMI:  Body mass index is 29.4 kg/m.  Estimated Nutritional Needs:   Kcal:  1800-2000  Protein:  85-100 grams  Fluid:  >/= 1.8 L    Gaynell Face, MS, RD, LDN Inpatient Clinical Dietitian Pager: [phone] Weekend/After Hours: [phone]

## 2019-01-20 NOTE — Progress Notes (Signed)
On call ordered EKG, in chart.  Will continue to monitor.

## 2019-01-20 NOTE — TOC Initial Note (Addendum)
Transition of Care [facility]) - Initial/Assessment Note    Patient Details  Name: Jacob [name] MRN: [id number] Date of Birth: 1941-03-10  Transition [facility]) CM/SW Contact:    Trish Mage, LCSW Phone Number: 01/20/2019, 12:37 PM  Clinical Narrative:   Mr Dada was seen due to high risk for readmission, and [facility] PT recommendation.  Mr Benning reports he has overwhelming back pain, and is very focused on this during the course of our conversation.  He lives at home with his wife, who also has some medical issues.  Limited family support.  Is getting support from [facility] and says he just talked to them recently about how they [he and wife] can get more assistance.  He uses a walker, rejects an order for a besdside commode, and says he would like me to set him up with [facility] PT if possible. He gets his outpatient care and medications through [facility].    Patient referred to, and accepted by [facility] for PT.           Expected Discharge Plan: [place] Barriers to Discharge: No Barriers Identified   Patient Goals and CMS Choice Patient states their goals for this hospitalization and ongoing recovery are:: "I got to get this back pain taken care of.  I can hardly function." CMS Medicare.gov Compare Post Acute Care list provided to:: Patient Choice offered to / list presented to : Patient  Expected Discharge Plan and Services Expected Discharge Plan: [place]   Discharge Planning Services: CM Consult Post Acute Care Choice: [place] arrangements for the past 2 months: Apartment                           HH Arranged: PT, Nurse's Aide [place] Agency: [facility] [employer] [facility] [employer] Date [place]: 01/20/19 Time [place]: [phone] Representative spoke with at [place]: [place] Arrangements/Services Living arrangements for the past 2 months: Apartment Lives with::  Spouse Patient language and need for interpreter reviewed:: Yes Do you feel safe going back to the place where you live?: Yes      Need for Family Participation in Patient Care: Yes (Comment) Care giver support system in place?: Yes (comment) Current home services: DME Criminal Activity/Legal Involvement Pertinent to Current Situation/Hospitalization: No - Comment as needed  Activities of Daily Living Home Assistive Devices/Equipment: CBG Meter, Hospital bed, Eyeglasses, Dentures (specify type), Walker (specify type) ADL Screening (condition at time of admission) Patient's cognitive ability adequate to safely complete daily activities?: No Is the patient deaf or have difficulty hearing?: No Does the patient have difficulty seeing, even when wearing glasses/contacts?: Yes Does the patient have difficulty concentrating, remembering, or making decisions?: No Patient able to express need for assistance with ADLs?: Yes Does the patient have difficulty dressing or bathing?: Yes Independently performs ADLs?: Yes (appropriate for developmental age) Does the patient have difficulty walking or climbing stairs?: Yes Weakness of Legs: Both Weakness of Arms/Hands: None  Permission Sought/Granted                  Emotional Assessment Appearance:: Appears stated age Attitude/Demeanor/Rapport: Engaged Affect (typically observed): Appropriate Orientation: : Oriented to Self, Oriented to Place, Oriented to  Time, Oriented to Situation Alcohol / Substance Use: Not Applicable Psych Involvement: No (comment)  Admission diagnosis:  Dehydration [E86.0] Hypokalemia [E87.6] Other chronic  pain [G89.29] AKI (acute kidney injury) ([place]) [N17.9] Intractable vomiting with nausea, unspecified vomiting type [R11.2] Patient Active Problem List   Diagnosis Date Noted  . Intractable nausea and vomiting 01/19/2019  . Dehydration   . Intractable vomiting   . Hypokalemia   . GERD (gastroesophageal reflux  disease) 11/07/2018  . History of CHF (congestive heart failure)   . Facial cellulitis   . Acute on chronic congestive heart failure ([place])   . Gait abnormality 11/02/2018  . Cellulitis 11/01/2018  . Paroxysmal atrial fibrillation ([place]) 08/22/2018  . High risk medication use 08/22/2018  . Chronic anticoagulation 08/22/2018  . On amiodarone therapy 08/22/2018  . Hypertensive heart disease 04/24/2018  . CKD (chronic kidney disease) 11/27/2015  . Chronic diastolic heart failure ([place]) 11/27/2015  . Normocytic anemia 11/27/2015  . AKI (acute kidney injury) ([place])   . Acute renal failure superimposed on stage 4 chronic kidney disease ([place]) 10/02/2015  . Hypotension 10/02/2015  . Anorexia   . Adjustment disorder with mixed emotional features 09/01/2015  . Acute encephalopathy 08/29/2015  . Elevated troponin 08/29/2015  . Renal cancer ([place]) 08/28/2015  . Spinal stenosis of lumbar region with radiculopathy 08/07/2015  . COPD (chronic obstructive pulmonary disease) ([place])   . OSA (obstructive sleep apnea) 07/27/2015  . HNP (herniated nucleus pulposus), lumbar 07/24/2015  . PMR (polymyalgia rheumatica) ([place]) 09/09/2013  . Rheumatoid arthritis ([place]) 04/13/2012  . CAD (coronary artery disease) 12/19/2011  . Type 2 diabetes mellitus with other specified complication ([place]) 95/09/2255  . Neuropathy ([place]) 12/19/2011   PCP:  Gifford Shave, MD Pharmacy:   [id number] - [place], [place] - [address] [place] [address] Phone: [phone] Fax: [phone]  [facility] [place], [place] - [address] [place] [address] Phone: [phone] Fax: [phone]     Social Determinants of Health (SDOH) Interventions    Readmission Risk Interventions No flowsheet data found.

## 2019-01-21 LAB — GLUCOSE, CAPILLARY
Glucose-Capillary: 167 mg/dL — ABNORMAL HIGH (ref 70–99)
Glucose-Capillary: 220 mg/dL — ABNORMAL HIGH (ref 70–99)
Glucose-Capillary: 234 mg/dL — ABNORMAL HIGH (ref 70–99)
Glucose-Capillary: 190 mg/dL — ABNORMAL HIGH (ref 70–99)

## 2019-01-21 LAB — BASIC METABOLIC PANEL
Anion gap: 8 (ref 5–15)
BUN: 22 mg/dL (ref 8–23)
CO2: 26 mmol/L (ref 22–32)
Calcium: 8 mg/dL — ABNORMAL LOW (ref 8.9–10.3)
Chloride: 110 mmol/L (ref 98–111)
Creatinine, Ser: 2.67 mg/dL — ABNORMAL HIGH (ref 0.61–1.24)
GFR calc Af Amer: 25 mL/min — ABNORMAL LOW (ref >60)
GFR calc non Af Amer: 22 mL/min — ABNORMAL LOW (ref >60)
Glucose, Bld: 186 mg/dL — ABNORMAL HIGH (ref 70–99)
Potassium: 4.4 mmol/L (ref 3.5–5.1)
Sodium: 144 mmol/L (ref 135–145)

## 2019-01-21 MED ORDER — SALINE SPRAY 0.65 % NA SOLN
1.0000 | NASAL | Status: DC | PRN
  Administered 2019-01-21: 1 via NASAL
  Filled 2019-01-21: qty 44

## 2019-01-21 NOTE — Progress Notes (Signed)
Subjective: Nausea and vomiting improving. No abdominal pain.  Objective: Vital signs in last 24 hours: Temp:  [98.2 F (36.8 C)-98.5 F (36.9 C)] 98.5 F (36.9 C) (11/07 0511) Pulse Rate:  [68-79] 79 (11/07 0511) Resp:  [18-20] 20 (11/07 0511) BP: (146-162)/(76-81) 162/81 (11/07 0511) SpO2:  [93 %-97 %] 97 % (11/07 0511) Weight change:  Last BM Date: 01/17/19  PE: GEN:  NAD  Lab Results: CBC    Component Value Date/Time   WBC 7.1 01/20/2019 0540   RBC 3.89 (L) 01/20/2019 0540   HGB 11.8 (L) 01/20/2019 0540   HCT 38.4 (L) 01/20/2019 0540   PLT 191 01/20/2019 0540   MCV 98.7 01/20/2019 0540   MCV 93.2 03/29/2014 1737   MCH 30.3 01/20/2019 0540   MCHC 30.7 01/20/2019 0540   RDW 14.4 01/20/2019 0540   LYMPHSABS 0.9 01/19/2019 1514   MONOABS 0.8 01/19/2019 1514   EOSABS 0.0 01/19/2019 1514   BASOSABS 0.0 01/19/2019 1514   CMP     Component Value Date/Time   NA 144 01/21/2019 0927   NA 142 12/20/2018 1448   K 4.4 01/21/2019 0927   CL 110 01/21/2019 0927   CO2 26 01/21/2019 0927   GLUCOSE 186 (H) 01/21/2019 0927   BUN 22 01/21/2019 0927   BUN 38 (H) 12/20/2018 1448   CREATININE 2.67 (H) 01/21/2019 0927   CREATININE 1.41 (H) 03/29/2014 1812   CALCIUM 8.0 (L) 01/21/2019 0927   PROT 6.4 (L) 01/20/2019 0540   PROT 6.0 08/23/2018 1058   ALBUMIN 3.6 01/20/2019 0540   ALBUMIN 3.7 08/23/2018 1058   AST 14 (L) 01/20/2019 0540   ALT 17 01/20/2019 0540   ALKPHOS 66 01/20/2019 0540   BILITOT 1.3 (H) 01/20/2019 0540   BILITOT 0.6 08/23/2018 1058   GFRNONAA 22 (L) 01/21/2019 0927   GFRNONAA 51 (L) 01/24/2014 1425   GFRAA 25 (L) 01/21/2019 0927   GFRAA 59 (L) 01/24/2014 1425   Assessment:  1.  Refractory nausea and vomiting.  Acutely worse over past couple weeks after adding new medications to his regimen for back pain.  Has chronic baseline "foamy" emesis and nausea.  Suspect patient's symptoms are at least in large part gastroparesis-mediated, which in turn is likely  multifactorial (diabetes, narcotics and other medication effects), as well as some likely underlying GERD (which could be primary, or in turn secondary to gastroparesis as well).  Has improved a bit since yesterday (11/6). 2.  Multiple medical problems (CKD, DM with neuropathy, HTN, heart failure). 3.  Atrial fibrillation on chronic anticoagulation (apixaban).  Plan:  1.  PPI. 2.  Continue scheduled (not prn) antiemetics. 3.  Advance to soft diet. 4.  If not continuing to do better tomorrow, would consider EGD Monday. 5.  [facility] will follow.   Landry Dyke 01/21/2019, 1:31 PM   Cell [phone] If no answer or after 5 PM call [phone]

## 2019-01-21 NOTE — Progress Notes (Signed)
Patient still having a lot of nausea, no vomiting just spitting up clear fluid. Not tolerating the liquid diet and hasn't tried solid food yet, too nervous to eat anything. Will pass this information along to oncoming RN. May need to have EGD Monday.

## 2019-01-21 NOTE — Progress Notes (Addendum)
PROGRESS NOTE    Jacob [name]  UXL:[id number] DOB: 07/19/1940 DOA: 01/19/2019 PCP: Gifford Shave, MD   Brief Narrative:78 y.o.malewith medical history significant ofdiastolic heart failure, paroxysmal atrial fibrillation on Eliquis, CKD stage IV, type 2 diabetes with neuropathy, rheumatoid arthritis, COPD, sarcoidosis, obstructive sleep apnea and BPH admitted with complaints of intractable nausea and vomiting for the last 2 weeks. Denies any diarrhea or abdominal pain. Denies any hematemesis melena. Wife reports that he is very lightheaded and unable to stand up. Denies headache or changes with his vision. Denies sick contacts. Patient headache Covid test 3 weeks ago which was negative. Denies chest pain shortness of breath cough. He has [facility] a [facility] many many [facility]. He has no history of gastric ulcer or gastritis. He has chronic back pain radiates to his right leg followed by neurology for lumbar radiculopathy.    Assessment & Plan:   Active Problems:   Intractable nausea and vomiting   Other chronic pain   #1 intractable nausea and vomiting -patient continues to have nausea and vomiting on full liquid diet.   Appreciate GI input.   Continue PPI and Zofran.   Possible EGD on Monday   #2 hypokalemia with CKD stage IV repleted potassium 4.4.  #3 history of stage IV CKD creatinine slightly higher than his normal baseline at the time of admission creatinine 3.29 he mostly runs around 2.97.   Creatinine down to 2.67 on 01/21/2019.   Lasix is still on hold monitor closely for fluid overload.  #4 dehydration patient is orthostatic continue IV fluids  #5 hypertension restart home medications hold Lasix.  Blood pressure 162/81 prior to morning medications.  #6 type 2 diabetes with neuropathy SSI hold long-acting insulin.  Blood sugar 1 67-2 20.  #7 history of heart failure with preserved ejection fraction monitor closely hold Lasix  Rehydrate  cautiously.  #8 urinary retention patient with history of BPH on Proscar and Flomax.  He was unable to urinate yesterday with the bladder scan showing over 600 cc of urine.  A Foley catheter was placed.  He will follow-up with [facility] as an outpatient after discharge within 1 to 2 weeks.  The family is concerned about taking him home with a Foley in place will arrange for home health to teach him how to empty the Foley catheter.     Nutrition Problem: Inadequate oral intake Etiology: nausea, vomiting     Signs/Symptoms: per patient/family report    Interventions: Glucerna shake  Estimated body mass index is 29.4 kg/m as calculated from the following:   Height as of this encounter: 5\' 11"  (1.803 m).   Weight as of this encounter: 95.6 kg. DVT prophylaxis: eliquis Code Status:full  Family Communication:dw daughter debbie Disposition Plan:pending clinical improvement    Consultants:   GI  Procedures: None Antimicrobials: None   Subjective:  Continues to have nausea and vomiting  not able to keep anything down Objective: Vitals:   01/20/19 0523 01/20/19 1331 01/20/19 2031 01/21/19 0511  BP: (!) 147/75 (!) 146/76 (!) 147/81 (!) 162/81  Pulse: 76 68 78 79  Resp: 16 18 18 20   Temp: 98.1 F (36.7 C) 98.2 F (36.8 C) 98.2 F (36.8 C) 98.5 F (36.9 C)  TempSrc:  Oral Oral Oral  SpO2: 94% 96% 93% 97%  Weight:      Height:        Intake/Output Summary (Last 24 hours) at 01/21/2019 1232 Last data filed at 01/21/2019 0600 Gross  per 24 hour  Intake 1921.3 ml  Output 1400 ml  Net 521.3 ml   Filed Weights   01/19/19 2348  Weight: 95.6 kg    Examination:  General exam: Appears calm and comfortable  Respiratory system: Diminished breath sounds at the bases to auscultation. Respiratory effort normal. Cardiovascular system: S1 & S2 heard, RRR. No JVD, murmurs, rubs, gallops or clicks. No pedal edema. Gastrointestinal system: Abdomen is distended, soft  and nontender. No organomegaly or masses felt. Normal bowel sounds heard. Central nervous system: Alert and oriented. No focal neurological deficits. Extremities trace bilateral pitting edema  skin: No rashes, lesions or ulcers Psychiatry: Judgement and insight appear normal. Mood & affect appropriate.     Data Reviewed: I have personally reviewed following labs and imaging studies  CBC: Recent Labs  Lab 01/19/19 1514 01/20/19 0540  WBC 6.7 7.1  NEUTROABS 4.9  --   HGB 13.2 11.8*  HCT 40.9 38.4*  MCV 96.2 98.7  PLT 179 683   Basic Metabolic Panel: Recent Labs  Lab 01/19/19 1514 01/20/19 0540 01/21/19 0927  NA 142 144 144  K 2.8* 3.3* 4.4  CL 101 106 110  CO2 28 26 26   GLUCOSE 164* 109* 186*  BUN 29* 25* 22  CREATININE 3.29* 3.10* 2.67*  CALCIUM 8.6* 8.3* 8.0*  MG  --  2.2  --    GFR: Estimated Creatinine Clearance: 26.5 mL/min (A) (by C-G formula based on SCr of 2.67 mg/dL (H)). Liver Function Tests: Recent Labs  Lab 01/19/19 1514 01/20/19 0540  AST 18 14*  ALT 19 17  ALKPHOS 71 66  BILITOT 1.7* 1.3*  PROT 6.7 6.4*  ALBUMIN 3.9 3.6   Recent Labs  Lab 01/19/19 1514  LIPASE 26   No results for input(s): AMMONIA in the last 168 hours. Coagulation Profile: No results for input(s): INR, PROTIME in the last 168 hours. Cardiac Enzymes: No results for input(s): CKTOTAL, CKMB, CKMBINDEX, TROPONINI in the last 168 hours. BNP (last 3 results) Recent Labs    08/23/18 1058  PROBNP 3,661*   HbA1C: No results for input(s): HGBA1C in the last 72 hours. CBG: Recent Labs  Lab 01/20/19 1222 01/20/19 1639 01/20/19 2030 01/21/19 0737 01/21/19 1157  GLUCAP 212* 211* 185* 167* 220*   Lipid Profile: No results for input(s): CHOL, HDL, LDLCALC, TRIG, CHOLHDL, LDLDIRECT in the last 72 hours. Thyroid Function Tests: No results for input(s): TSH, T4TOTAL, FREET4, T3FREE, THYROIDAB in the last 72 hours. Anemia Panel: No results for input(s): VITAMINB12, FOLATE,  FERRITIN, TIBC, IRON, RETICCTPCT in the last 72 hours. Sepsis Labs: No results for input(s): PROCALCITON, LATICACIDVEN in the last 168 hours.  Recent Results (from the past 240 hour(s))  SARS CORONAVIRUS 2 (TAT 6-24 HRS) Nasopharyngeal Nasopharyngeal Swab     Status: None   Collection Time: 01/19/19  7:41 PM   Specimen: Nasopharyngeal Swab  Result Value Ref Range Status   SARS Coronavirus 2 NEGATIVE NEGATIVE Final    Comment: (NOTE) SARS-CoV-2 target nucleic acids are NOT DETECTED. The SARS-CoV-2 RNA is generally detectable in upper and lower respiratory specimens during the acute phase of infection. Negative results do not preclude SARS-CoV-2 infection, do not rule out co-infections with other pathogens, and should not be used as the sole basis for treatment or other patient management decisions. Negative results must be combined with clinical observations, patient history, and epidemiological information. The expected result is Negative. Fact Sheet for Patients: SugarRoll.be Fact Sheet for Healthcare Providers: https://www.woods-.com/ This test is not  yet approved or cleared by the [place] and  has been authorized for detection and/or diagnosis of SARS-CoV-2 by FDA under an Emergency Use Authorization (EUA). This EUA will remain  in effect (meaning this test can be used) for the duration of the COVID-19 declaration under Section 56 4(b)(1) of the Act, 21 U.S.C. section 360bbb-3(b)(1), unless the authorization is terminated or revoked sooner. Performed at [facility], [place] [address] [place], [place] [address]   MRSA PCR Screening     Status: Abnormal   Collection Time: 01/20/19 10:26 AM   Specimen: Nasal Mucosa; Nasopharyngeal  Result Value Ref Range Status   MRSA by PCR POSITIVE (A) NEGATIVE Final    Comment:        The GeneXpert MRSA Assay (FDA approved for NASAL specimens only), is one component of a  comprehensive MRSA colonization surveillance program. It is not intended to diagnose MRSA infection nor to guide or monitor treatment for MRSA infections. RESULT CALLED TO, READ BACK BY AND VERIFIED WITH: DUMAS,N. RN @1228  ON 11.06.2020 BY COHEN,K Performed at [facility], [place] [address], [place], [place] [address]          Radiology Studies: Dg Chest Port 1 View  Result Date: 01/19/2019 CLINICAL DATA:  Failure to thrive EXAM: PORTABLE CHEST 1 VIEW COMPARISON:  Radiograph November 01, 2018, CT Jul 27, 2015 FINDINGS: Postsurgical changes related to prior CABG including intact and aligned sternotomy wires and multiple surgical clips projecting over the mediastinum. Stable cardiomegaly. Calcified aorta is again noted. Central pulmonary arterial enlargement is suggestive of chronic pulmonary artery hypertension, better seen on comparison CTs. Few scattered calcified granulomata are again seen in the lungs. Hazy basilar atelectatic changes are noted. No convincing features of edema. No consolidative process, pneumothorax or effusion. Degenerative changes are present in the imaged spine and shoulders. IMPRESSION: 1. Atelectasis, otherwise no acute cardiopulmonary disease. 2. Postsurgical changes related to prior CABG. 3. Findings suggestive of chronic pulmonary artery hypertension, better seen on comparison CT. Electronically Signed   By: Lovena Le M.D.   On: 01/19/2019 15:30        Scheduled Meds: . apixaban  5 mg Oral BID  . [START ON 01/23/2019] atorvastatin  20 mg Oral Once per day on Mon Wed  . Chlorhexidine Gluconate Cloth  6 each Topical Daily  . feeding supplement (GLUCERNA SHAKE)  237 mL Oral TID BM  . finasteride  5 mg Oral QHS  . hydrALAZINE  25 mg Oral BID  . insulin aspart  0-5 Units Subcutaneous QHS  . insulin aspart  0-9 Units Subcutaneous TID WC  . latanoprost  1 drop Both Eyes QHS  . metoprolol tartrate  100 mg Oral BID  . mupirocin ointment  1  application Nasal BID  . pantoprazole (PROTONIX) IV  40 mg Intravenous Q12H  . pilocarpine  1 drop Both Eyes TID  . predniSONE  5 mg Oral Q breakfast  . tamsulosin  0.4 mg Oral BID   Continuous Infusions: . sodium chloride 50 mL/hr at 01/21/19 0210     LOS: 2 days     Georgette Shell, MD [facility]  If 7PM-7AM, please contact night-coverage [email or web address] Password [facility] 01/21/2019, 12:32 PM

## 2019-01-22 LAB — RAPID URINE DRUG SCREEN, HOSP PERFORMED
Amphetamines: NOT DETECTED
Barbiturates: NOT DETECTED
Benzodiazepines: NOT DETECTED
Cocaine: NOT DETECTED
Opiates: POSITIVE — AB
Tetrahydrocannabinol: NOT DETECTED

## 2019-01-22 LAB — GLUCOSE, CAPILLARY
Glucose-Capillary: 173 mg/dL — ABNORMAL HIGH (ref 70–99)
Glucose-Capillary: 199 mg/dL — ABNORMAL HIGH (ref 70–99)
Glucose-Capillary: 162 mg/dL — ABNORMAL HIGH (ref 70–99)
Glucose-Capillary: 164 mg/dL — ABNORMAL HIGH (ref 70–99)

## 2019-01-22 MED ORDER — DICYCLOMINE HCL 20 MG PO TABS
20.0000 mg | ORAL_TABLET | Freq: Four times a day (QID) | ORAL | Status: DC | PRN
  Administered 2019-01-22: 20 mg via ORAL
  Filled 2019-01-22: qty 1

## 2019-01-22 MED ORDER — AMLODIPINE BESYLATE 10 MG PO TABS
10.0000 mg | ORAL_TABLET | Freq: Every day | ORAL | Status: DC
  Administered 2019-01-22 – 2019-01-24 (×3): 10 mg via ORAL
  Filled 2019-01-22 (×3): qty 1

## 2019-01-22 MED ORDER — LORAZEPAM 2 MG/ML IJ SOLN
0.5000 mg | INTRAMUSCULAR | Status: DC | PRN
  Administered 2019-01-22 (×2): 0.5 mg via INTRAVENOUS
  Filled 2019-01-22: qty 1

## 2019-01-22 MED ORDER — CLONIDINE HCL 0.1 MG PO TABS
0.1000 mg | ORAL_TABLET | Freq: Four times a day (QID) | ORAL | Status: DC
  Administered 2019-01-22 – 2019-01-24 (×7): 0.1 mg via ORAL
  Filled 2019-01-22 (×7): qty 1

## 2019-01-22 MED ORDER — LORAZEPAM 2 MG/ML IJ SOLN
0.5000 mg | Freq: Four times a day (QID) | INTRAMUSCULAR | Status: DC | PRN
  Administered 2019-01-22: 0.5 mg via INTRAVENOUS
  Filled 2019-01-22 (×2): qty 1

## 2019-01-22 MED ORDER — CLONIDINE HCL 0.1 MG PO TABS: 0.1000 mg | ORAL_TABLET | Freq: Every day | ORAL | Status: DC

## 2019-01-22 MED ORDER — TRAMADOL HCL 50 MG PO TABS
50.0000 mg | ORAL_TABLET | Freq: Two times a day (BID) | ORAL | Status: DC | PRN
  Administered 2019-01-22 – 2019-01-24 (×2): 50 mg via ORAL
  Filled 2019-01-22 (×2): qty 1

## 2019-01-22 MED ORDER — CALCITRIOL 0.25 MCG PO CAPS
0.2500 ug | ORAL_CAPSULE | ORAL | Status: DC
  Administered 2019-01-23: 0.25 ug via ORAL
  Filled 2019-01-22: qty 1

## 2019-01-22 MED ORDER — METHOCARBAMOL 500 MG PO TABS
500.0000 mg | ORAL_TABLET | Freq: Three times a day (TID) | ORAL | Status: DC | PRN
  Administered 2019-01-22: 500 mg via ORAL
  Filled 2019-01-22: qty 1

## 2019-01-22 MED ORDER — HYDROXYZINE HCL 25 MG PO TABS: 25.0000 mg | ORAL_TABLET | Freq: Four times a day (QID) | ORAL | Status: DC | PRN

## 2019-01-22 MED ORDER — CLONIDINE HCL 0.1 MG PO TABS: 0.1000 mg | ORAL_TABLET | ORAL | Status: DC

## 2019-01-22 NOTE — Plan of Care (Signed)
  Problem: Education: Goal: Knowledge of General Education information will improve Description: Including pain rating scale, medication(s)/side effects and non-pharmacologic comfort measures Outcome: Progressing   Problem: Clinical Measurements: Goal: Ability to maintain clinical measurements within normal limits will improve Outcome: Progressing Goal: Will remain free from infection Outcome: Progressing Goal: Diagnostic test results will improve Outcome: Progressing Goal: Respiratory complications will improve Outcome: Progressing   Problem: Nutrition: Goal: Adequate nutrition will be maintained Outcome: Progressing   Problem: Coping: Goal: Level of anxiety will decrease Outcome: Progressing   

## 2019-01-22 NOTE — Progress Notes (Signed)
Subjective: Nausea and vomiting worsening.  Objective: Vital signs in last 24 hours: Temp:  [98.3 F (36.8 C)-98.7 F (37.1 C)] 98.5 F (36.9 C) (11/08 1313) Pulse Rate:  [70-74] 74 (11/08 1313) Resp:  [18-22] 18 (11/08 1313) BP: (139-189)/(71-92) 139/71 (11/08 1313) SpO2:  [96 %-97 %] 96 % (11/08 1313) Weight change:  Last BM Date: 01/21/19  PE: GEN:  Nauseated-appearing but NAD ABD:  Protuberant, soft, non-tender  Lab Results: CBC    Component Value Date/Time   WBC 7.1 01/20/2019 0540   RBC 3.89 (L) 01/20/2019 0540   HGB 11.8 (L) 01/20/2019 0540   HCT 38.4 (L) 01/20/2019 0540   PLT 191 01/20/2019 0540   MCV 98.7 01/20/2019 0540   MCV 93.2 03/29/2014 1737   MCH 30.3 01/20/2019 0540   MCHC 30.7 01/20/2019 0540   RDW 14.4 01/20/2019 0540   LYMPHSABS 0.9 01/19/2019 1514   MONOABS 0.8 01/19/2019 1514   EOSABS 0.0 01/19/2019 1514   BASOSABS 0.0 01/19/2019 1514   CMP     Component Value Date/Time   NA 144 01/21/2019 0927   NA 142 12/20/2018 1448   K 4.4 01/21/2019 0927   CL 110 01/21/2019 0927   CO2 26 01/21/2019 0927   GLUCOSE 186 (H) 01/21/2019 0927   BUN 22 01/21/2019 0927   BUN 38 (H) 12/20/2018 1448   CREATININE 2.67 (H) 01/21/2019 0927   CREATININE 1.41 (H) 03/29/2014 1812   CALCIUM 8.0 (L) 01/21/2019 0927   PROT 6.4 (L) 01/20/2019 0540   PROT 6.0 08/23/2018 1058   ALBUMIN 3.6 01/20/2019 0540   ALBUMIN 3.7 08/23/2018 1058   AST 14 (L) 01/20/2019 0540   ALT 17 01/20/2019 0540   ALKPHOS 66 01/20/2019 0540   BILITOT 1.3 (H) 01/20/2019 0540   BILITOT 0.6 08/23/2018 1058   GFRNONAA 22 (L) 01/21/2019 0927   GFRNONAA 51 (L) 01/24/2014 1425   GFRAA 25 (L) 01/21/2019 0927   GFRAA 59 (L) 01/24/2014 1425    Assessment:  1. Refractory nausea and vomiting. Acutely worse over past couple weeks after adding new medications to his regimen for back pain. Has chronic baseline "foamy" emesis and nausea. Suspect patient's symptoms are at least in large part  gastroparesis-mediated, which in turn is likely multifactorial (diabetes, narcotics and other medication effects), as well as some likely underlying GERD (which could be primary, or in turn secondary to gastroparesis as well).  Has improved a bit since yesterday (11/6). 2. Multiple medical problems (CKD, DM with neuropathy, HTN, heart failure). 3. Atrial fibrillation on chronic anticoagulation (apixaban).  Plan:  1.  Scheduled antiemetics, PPI, minimize narcotics. 2.  Apixaban on hold. 3.  EGD tomorrow. 4.  Risks (bleeding, infection, bowel perforation that could require surgery, sedation-related changes in cardiopulmonary systems), benefits (identification and possible treatment of source of symptoms, exclusion of certain causes of symptoms), and alternatives (watchful waiting, radiographic imaging studies, empiric medical treatment) of upper endoscopy (EGD) were explained to patient/family in detail and patient wishes to proceed. 5.  Next step in management pending endoscopy findings.   Landry Dyke 01/22/2019, 1:30 PM   Cell [phone] If no answer or after 5 PM call [phone]

## 2019-01-22 NOTE — Anesthesia Preprocedure Evaluation (Addendum)
Anesthesia Evaluation  Patient identified by MRN, date of birth, ID band Patient awake    Reviewed: Allergy & Precautions, NPO status , Patient's Chart, lab work & pertinent test results  Airway Mallampati: III  TM Distance: >3 FB Neck ROM: Full    Dental  (+) Dental Advisory Given, Missing   Pulmonary asthma , sleep apnea and Continuous Positive Airway Pressure Ventilation , COPD, former smoker,    Pulmonary exam normal breath sounds clear to auscultation       Cardiovascular hypertension, Pt. on home beta blockers and Pt. on medications + CAD, + CABG and +CHF  Normal cardiovascular exam+ dysrhythmias Atrial Fibrillation  Rhythm:Regular Rate:Normal     Neuro/Psych PSYCHIATRIC DISORDERS Anxiety  Neuromuscular disease    GI/Hepatic Neg liver ROS, GERD  Medicated,  Endo/Other  diabetes, Type 2, Insulin Dependent  Renal/GU Renal InsufficiencyRenal disease     Musculoskeletal  (+) Arthritis , Polymyalgia rheumatica   Abdominal   Peds  Hematology negative hematology ROS (+)   Anesthesia Other Findings Day of surgery medications reviewed with the patient.  Reproductive/Obstetrics                            Anesthesia Physical Anesthesia Plan  ASA: III  Anesthesia Plan: MAC   Post-op Pain Management:    Induction: Intravenous  PONV Risk Score and Plan: 1 and Propofol infusion and Treatment may vary due to age or medical condition  Airway Management Planned: Natural Airway and Nasal Cannula  Additional Equipment:   Intra-op Plan:   Post-operative Plan:   Informed Consent: I have reviewed the patients History and Physical, chart, labs and discussed the procedure including the risks, benefits and alternatives for the proposed anesthesia with the patient or authorized representative who has indicated his/her understanding and acceptance.     Dental advisory given  Plan Discussed with:  CRNA  Anesthesia Plan Comments:        Anesthesia Quick Evaluation

## 2019-01-22 NOTE — Progress Notes (Signed)
PROGRESS NOTE    Jacob [name]  HKV:[id number] DOB: August 18, 1940 DOA: 01/19/2019 PCP: Gifford Shave, MD    Brief Narrative: :78 y.o.malewith medical history significant ofdiastolic heart failure, paroxysmal atrial fibrillation on Eliquis, CKD stage IV, type 2 diabetes with neuropathy, rheumatoid arthritis, COPD, sarcoidosis, obstructive sleep apnea and BPH admitted with complaints of intractable nausea and vomiting for the last 2 weeks. Denies any diarrhea or abdominal pain. Denies any hematemesis melena. Wife reports that he is very lightheaded and unable to stand up. Denies headache or changes with his vision. Denies sick contacts. Patient headache Covid test 3 weeks ago which was negative. Denies chest pain shortness of breath cough. He has [facility] He has no history of gastric ulcer or gastritis. He has chronic back pain radiates to his right leg followed by neurology for lumbar radiculopathy.   Assessment & Plan:   Active Problems:   Intractable nausea and vomiting   Other chronic pain   #1 intractable nausea and vomiting -patient continues to complain of nausea and vomiting on clear liquid diet.  Staff reports he had chicken broth last night which came up.  And had Jell-O this morning that also came up.  Will hold Eliquis in preparation for EGD tomorrow   He is not able to tolerate any p.o. intake Continue PPI and Zofran.   Possible EGD on Monday   #2 hypokalemia with CKD stage IV repleted potassium 4.4.  Resolved.  #3 history of stage IV CKD creatinine slightly higher than his normal baseline at the time of admission creatinine 3.29 he mostly runs around 2.97.  Creatinine down to 2.67 on 01/21/2019.  Lasix is still on hold monitor closely for fluid overload.  Follow-up labs in a.m.  #4 dehydrationpatient is orthostatic continue IV fluids  #5 hypertension restart home medications hold Lasix. Blood pressure 189/86 prior  to morning medications.  #6 type 2 diabetes with neuropathy SSI hold long-acting insulin.  Blood sugar 1 73-2 30.  #7 history of heart failure with preserved ejection fraction monitor closely hold Lasix  Rehydrate cautiously.  #8 urinary retention patient with history of BPH on Proscar and Flomax.  He was unable to urinate yesterday with the bladder scan showing over 600 cc of urine.  A Foley catheter was placed.  [facility] as an outpatient after discharge within 1 to 2 weeks.  The family is concerned about taking him home with a Foley in place will arrange for home health to teach him how to empty the Foley catheter.  #9 chronic lumbar radiculopathy patient is followed by neurology.  Restart Robaxin.  He was not on any medications at home prior to admission for pain.  I will start him on tramadol 50 every 6 as needed.      Nutrition Problem: Inadequate oral intake Etiology: nausea, vomiting     Signs/Symptoms: per patient/family report    Interventions: Glucerna shake  Estimated body mass index is 29.4 kg/m as calculated from the following:   Height as of this encounter: 5\' 11"  (1.803 m).   Weight as of this encounter: 95.6 kg.  DVT prophylaxis:eliquis on hold for EGD Code Status:full Family Communication called daughter Jackelyn Poling with no response Disposition Plan:pending clinical improvement   Consultants:  GI  Procedures:None Antimicrobials:None   Subjective:  Patient complaining of nausea and vomiting with clear liquids had a bowel movement last night and this morning Objective: Vitals:   01/21/19 0511  01/21/19 1506 01/21/19 2042 01/22/19 0609  BP: (!) 162/81 (!) 143/72 (!) 150/92 (!) 189/86  Pulse: 79 74 73 70  Resp: 20 (!) 22 20 19   Temp: 98.5 F (36.9 C) 98.3 F (36.8 C) 98.7 F (37.1 C) 98.6 F (37 C)  TempSrc: Oral Oral Oral Oral  SpO2: 97% 96% 97% 96%  Weight:      Height:        Intake/Output Summary  (Last 24 hours) at 01/22/2019 1102 Last data filed at 01/22/2019 1696 Gross per 24 hour  Intake 800.2 ml  Output 1300 ml  Net -499.8 ml   Filed Weights   01/19/19 2348  Weight: 95.6 kg    Examination:  General exam: Appears calm and comfortable  Respiratory system: Diminished breath sounds at the base to auscultation. Respiratory effort normal. Cardiovascular system: S1 & S2 heard, RRR. No JVD, murmurs, rubs, gallops or clicks. No pedal edema. Gastrointestinal system: Abdomen is nondistended, soft and nontender. No organomegaly or masses felt. Normal bowel sounds heard. Central nervous system: Alert and oriented. No focal neurological deficits. Extremities: Symmetric 5 x 5 power. Skin: No rashes, lesions or ulcers Psychiatry: Judgement and insight appear normal. Mood & affect appropriate.     Data Reviewed: I have personally reviewed following labs and imaging studies  CBC: Recent Labs  Lab 01/19/19 1514 01/20/19 0540  WBC 6.7 7.1  NEUTROABS 4.9  --   HGB 13.2 11.8*  HCT 40.9 38.4*  MCV 96.2 98.7  PLT 179 789   Basic Metabolic Panel: Recent Labs  Lab 01/19/19 1514 01/20/19 0540 01/21/19 0927  NA 142 144 144  K 2.8* 3.3* 4.4  CL 101 106 110  CO2 28 26 26   GLUCOSE 164* 109* 186*  BUN 29* 25* 22  CREATININE 3.29* 3.10* 2.67*  CALCIUM 8.6* 8.3* 8.0*  MG  --  2.2  --    GFR: Estimated Creatinine Clearance: 26.5 mL/min (A) (by C-G formula based on SCr of 2.67 mg/dL (H)). Liver Function Tests: Recent Labs  Lab 01/19/19 1514 01/20/19 0540  AST 18 14*  ALT 19 17  ALKPHOS 71 66  BILITOT 1.7* 1.3*  PROT 6.7 6.4*  ALBUMIN 3.9 3.6   Recent Labs  Lab 01/19/19 1514  LIPASE 26   No results for input(s): AMMONIA in the last 168 hours. Coagulation Profile: No results for input(s): INR, PROTIME in the last 168 hours. Cardiac Enzymes: No results for input(s): CKTOTAL, CKMB, CKMBINDEX, TROPONINI in the last 168 hours. BNP (last 3 results) Recent Labs     08/23/18 1058  PROBNP 3,661*   HbA1C: No results for input(s): HGBA1C in the last 72 hours. CBG: Recent Labs  Lab 01/21/19 0737 01/21/19 1157 01/21/19 1636 01/21/19 2045 01/22/19 0714  GLUCAP 167* 220* 234* 190* 173*   Lipid Profile: No results for input(s): CHOL, HDL, LDLCALC, TRIG, CHOLHDL, LDLDIRECT in the last 72 hours. Thyroid Function Tests: No results for input(s): TSH, T4TOTAL, FREET4, T3FREE, THYROIDAB in the last 72 hours. Anemia Panel: No results for input(s): VITAMINB12, FOLATE, FERRITIN, TIBC, IRON, RETICCTPCT in the last 72 hours. Sepsis Labs: No results for input(s): PROCALCITON, LATICACIDVEN in the last 168 hours.  Recent Results (from the past 240 hour(s))  SARS CORONAVIRUS 2 (TAT 6-24 HRS) Nasopharyngeal Nasopharyngeal Swab     Status: None   Collection Time: 01/19/19  7:41 PM   Specimen: Nasopharyngeal Swab  Result Value Ref Range Status   SARS Coronavirus 2 NEGATIVE NEGATIVE Final    Comment: (NOTE)  SARS-CoV-2 target nucleic acids are NOT DETECTED. The SARS-CoV-2 RNA is generally detectable in upper and lower respiratory specimens during the acute phase of infection. Negative results do not preclude SARS-CoV-2 infection, do not rule out co-infections with other pathogens, and should not be used as the sole basis for treatment or other patient management decisions. Negative results must be combined with clinical observations, patient history, and epidemiological information. The expected result is Negative. Fact Sheet for Patients: SugarRoll.be Fact Sheet for Healthcare Providers: https://www.woods-[employer]ik.com/ This test is not yet approved or cleared by the [place] FDA and  has been authorized for detection and/or diagnosis of SARS-CoV-2 by FDA under an Emergency Use Authorization (EUA). This EUA will remain  in effect (meaning this test can be used) for the duration of the COVID-19 declaration under  Section 56 4(b)(1) of the Act, 21 U.S.C. section 360bbb-3(b)(1), unless the authorization is terminated or revoked sooner. Performed at [facility], [place] [address], [place], [place] [address]   MRSA PCR Screening     Status: Abnormal   Collection Time: 01/20/19 10:26 AM   Specimen: Nasal Mucosa; Nasopharyngeal  Result Value Ref Range Status   MRSA by PCR POSITIVE (A) NEGATIVE Final    Comment:        The GeneXpert MRSA Assay (FDA approved for NASAL specimens only), is one component of a comprehensive MRSA colonization surveillance program. It is not intended to diagnose MRSA infection nor to guide or monitor treatment for MRSA infections. RESULT CALLED TO, READ BACK BY AND VERIFIED WITH: DUMAS,N. RN @1228  ON 11.06.2020 BY COHEN,K Performed at [facility], [place] [address], [place], [place] [address]          Radiology Studies: No results found.      Scheduled Meds:  apixaban  5 mg Oral BID   [START ON 01/23/2019] atorvastatin  20 mg Oral Once per day on Mon Wed   Chlorhexidine Gluconate Cloth  6 each Topical Daily   feeding supplement (GLUCERNA SHAKE)  237 mL Oral TID BM   finasteride  5 mg Oral QHS   hydrALAZINE  25 mg Oral BID   insulin aspart  0-5 Units Subcutaneous QHS   insulin aspart  0-9 Units Subcutaneous TID WC   latanoprost  1 drop Both Eyes QHS   metoprolol tartrate  100 mg Oral BID   mupirocin ointment  1 application Nasal BID   pantoprazole (PROTONIX) IV  40 mg Intravenous Q12H   pilocarpine  1 drop Both Eyes TID   predniSONE  5 mg Oral Q breakfast   tamsulosin  0.4 mg Oral BID   Continuous Infusions:  sodium chloride 50 mL/hr at 01/22/19 1014     LOS: 3 days     Georgette Shell, MD [facility]  If 7PM-7AM, please contact night-coverage [email or web address] Password [email or web address] 01/22/2019, 11:02 AM

## 2019-01-23 ENCOUNTER — Encounter (HOSPITAL_COMMUNITY): Payer: Self-pay | Admitting: Gastroenterology

## 2019-01-23 ENCOUNTER — Encounter (HOSPITAL_COMMUNITY)
Admission: EM | Disposition: A | Discharge status: Home Health | Payer: Self-pay | Source: Home / Self Care | Attending: Internal Medicine

## 2019-01-23 ENCOUNTER — Inpatient Hospital Stay (HOSPITAL_COMMUNITY): Payer: No Typology Code available for payment source | Admitting: Anesthesiology

## 2019-01-23 DIAGNOSIS — E860 Dehydration: Secondary | ICD-10-CM

## 2019-01-23 DIAGNOSIS — K2940 Chronic atrophic gastritis without bleeding: Secondary | ICD-10-CM | POA: Diagnosis not present

## 2019-01-23 DIAGNOSIS — R112 Nausea with vomiting, unspecified: Secondary | ICD-10-CM

## 2019-01-23 DIAGNOSIS — G8929 Other chronic pain: Secondary | ICD-10-CM

## 2019-01-23 HISTORY — PX: ESOPHAGOGASTRODUODENOSCOPY (EGD) WITH PROPOFOL: SHX5813

## 2019-01-23 HISTORY — PX: BIOPSY: SHX5522

## 2019-01-23 LAB — CBC
HCT: 33.9 % — ABNORMAL LOW (ref 39.0–52.0)
Hemoglobin: 10.7 g/dL — ABNORMAL LOW (ref 13.0–17.0)
MCH: 31 pg (ref 26.0–34.0)
MCHC: 31.6 g/dL (ref 30.0–36.0)
MCV: 98.3 fL (ref 80.0–100.0)
Platelets: 185 10*3/uL (ref 150–400)
RBC: 3.45 MIL/uL — ABNORMAL LOW (ref 4.22–5.81)
RDW: 14.4 % (ref 11.5–15.5)
WBC: 10.7 10*3/uL — ABNORMAL HIGH (ref 4.0–10.5)
nRBC: 0 % (ref 0.0–0.2)

## 2019-01-23 LAB — GLUCOSE, CAPILLARY
Glucose-Capillary: 164 mg/dL — ABNORMAL HIGH (ref 70–99)
Glucose-Capillary: 169 mg/dL — ABNORMAL HIGH (ref 70–99)
Glucose-Capillary: 161 mg/dL — ABNORMAL HIGH (ref 70–99)
Glucose-Capillary: 134 mg/dL — ABNORMAL HIGH (ref 70–99)

## 2019-01-23 LAB — COMPREHENSIVE METABOLIC PANEL
ALT: 13 U/L (ref 0–44)
AST: 12 U/L — ABNORMAL LOW (ref 15–41)
Albumin: 3.1 g/dL — ABNORMAL LOW (ref 3.5–5.0)
Alkaline Phosphatase: 58 U/L (ref 38–126)
Anion gap: 8 (ref 5–15)
BUN: 19 mg/dL (ref 8–23)
CO2: 21 mmol/L — ABNORMAL LOW (ref 22–32)
Calcium: 7.7 mg/dL — ABNORMAL LOW (ref 8.9–10.3)
Chloride: 113 mmol/L — ABNORMAL HIGH (ref 98–111)
Creatinine, Ser: 2.51 mg/dL — ABNORMAL HIGH (ref 0.61–1.24)
GFR calc Af Amer: 27 mL/min — ABNORMAL LOW (ref >60)
GFR calc non Af Amer: 23 mL/min — ABNORMAL LOW (ref >60)
Glucose, Bld: 162 mg/dL — ABNORMAL HIGH (ref 70–99)
Potassium: 4.8 mmol/L (ref 3.5–5.1)
Sodium: 142 mmol/L (ref 135–145)
Total Bilirubin: 1.9 mg/dL — ABNORMAL HIGH (ref 0.3–1.2)
Total Protein: 6.1 g/dL — ABNORMAL LOW (ref 6.5–8.1)

## 2019-01-23 SURGERY — ESOPHAGOGASTRODUODENOSCOPY (EGD) WITH PROPOFOL
Anesthesia: Monitor Anesthesia Care | Laterality: Left

## 2019-01-23 MED ORDER — FUROSEMIDE 10 MG/ML IJ SOLN: 20.0000 mg | Freq: Once | INTRAMUSCULAR | Status: DC

## 2019-01-23 MED ORDER — FUROSEMIDE 10 MG/ML IJ SOLN
20.0000 mg | Freq: Two times a day (BID) | INTRAMUSCULAR | Status: DC
  Administered 2019-01-23 – 2019-01-24 (×2): 20 mg via INTRAVENOUS
  Filled 2019-01-23 (×2): qty 2

## 2019-01-23 MED ORDER — PROPOFOL 500 MG/50ML IV EMUL
INTRAVENOUS | Status: AC
  Filled 2019-01-23: qty 50

## 2019-01-23 MED ORDER — SODIUM CHLORIDE 0.9 % IV SOLN
INTRAVENOUS | Status: DC
  Administered 2019-01-23 (×2): via INTRAVENOUS

## 2019-01-23 MED ORDER — APIXABAN 5 MG PO TABS
5.0000 mg | ORAL_TABLET | Freq: Two times a day (BID) | ORAL | Status: DC
  Administered 2019-01-23 – 2019-01-24 (×2): 5 mg via ORAL
  Filled 2019-01-23 (×2): qty 1

## 2019-01-23 MED ORDER — NYSTATIN 100000 UNIT/ML MT SUSP: 5.0000 mL | Freq: Four times a day (QID) | OROMUCOSAL | 0 refills | Status: AC

## 2019-01-23 MED ORDER — PROPOFOL 10 MG/ML IV BOLUS
INTRAVENOUS | Status: DC | PRN
  Administered 2019-01-23 (×3): 20 mg via INTRAVENOUS

## 2019-01-23 MED ORDER — PROPOFOL 500 MG/50ML IV EMUL
INTRAVENOUS | Status: DC | PRN
  Administered 2019-01-23: 75 ug/kg/min via INTRAVENOUS

## 2019-01-23 MED ORDER — NYSTATIN 100000 UNIT/ML MT SUSP
5.0000 mL | Freq: Four times a day (QID) | OROMUCOSAL | Status: DC
  Administered 2019-01-23 – 2019-01-24 (×4): 500000 [IU] via ORAL
  Filled 2019-01-23 (×4): qty 5

## 2019-01-23 MED ORDER — FLUCONAZOLE IN SODIUM CHLORIDE 200-0.9 MG/100ML-% IV SOLN
200.0000 mg | INTRAVENOUS | Status: DC
  Administered 2019-01-23: 200 mg via INTRAVENOUS
  Filled 2019-01-23 (×2): qty 100

## 2019-01-23 SURGICAL SUPPLY — 15 items

## 2019-01-23 NOTE — Transfer of Care (Signed)
Immediate Anesthesia Transfer of Care Note  Patient: Jacob [name]  Procedure(s) Performed: ESOPHAGOGASTRODUODENOSCOPY (EGD) WITH PROPOFOL (Left )  Patient Location: PACU and Endoscopy Unit  Anesthesia Type:MAC  Level of Consciousness: awake, drowsy and responds to stimulation  Airway & Oxygen Therapy: Patient Spontanous Breathing and Patient connected to face mask oxygen  Post-op Assessment: Report given to RN and Post -op Vital signs reviewed and stable  Post vital signs: Reviewed and stable  Last Vitals:  Vitals Value Taken Time  BP    Temp    Pulse 81 01/23/19 0842  Resp 16 01/23/19 0842  SpO2 100 % 01/23/19 0842  Vitals shown include unvalidated device data.  Last Pain:  Vitals:   01/23/19 0727  TempSrc: Oral  PainSc: 0-No pain      Patients Stated Pain Goal: 3 (30/14/15 9733)  Complications: No apparent anesthesia complications

## 2019-01-23 NOTE — Progress Notes (Signed)
PROGRESS NOTE    Jacob [name]  LPF:[id number] DOB: [birth year] DOA: 01/19/2019 PCP: Gifford Shave, MD    Brief Narrative::78 y.o.malewith medical history significant ofdiastolic heart failure, paroxysmal atrial fibrillation on Eliquis, CKD stage IV, type 2 diabetes with neuropathy, rheumatoid arthritis, COPD, sarcoidosis, obstructive sleep apnea and BPH admitted with complaints of intractable nausea and vomiting for the last 2 weeks. Denies any diarrhea or abdominal pain. Denies any hematemesis melena. Wife reports that he is very lightheaded and unable to stand up. Denies headache or changes with his vision. Denies sick contacts. Patient headache Covid test 3 weeks ago which was negative. Denies chest pain shortness of breath cough. He has [facility] He has no history of gastric ulcer or gastritis. He has chronic back pain radiates to his right leg followed by neurology for lumbar radiculopathy.   Assessment & Plan:   Active Problems:   Intractable nausea and vomiting   Other chronic pain   #1 intractable nausea and vomiting-patient had EGD 01/23/2019- Abnormal esophageal motility suspicious for presbyesophagus.  Esophageal plaques suspicious for candidiasis.  Single gastric polyp biopsied.  Chronic atrophic gastritis biopsied.  Normal duodenum. GI recommended soft diet today Nystatin 4 times daily for 1 week Follow up with biopsy results Continue PPI and Zofran.    #2 hypokalemia with CKD stage IV repletedpotassium 4.8   #3 history of stage IV CKD creatinine slightly higher than his normal baseline at the time of admission creatinine 3.29 he mostly runs around 2.97.Creatinine down to 2.57 on 01/23/2019.  #4 dehydrationresolved with IV fluids  #5 hypertension 132/64 continue home meds  #6 type 2 diabetes with neuropathy SSI hold long-acting insulin. Blood sugar 164 to -199.  #7 history of heart failure with  preserved ejection fraction monitor closely .  Restart Lasix. He is positive by 6.9 lt  #8 urinary retention patient with history of BPH on Proscar and Flomax. He was unable to urinate yesterday with the bladder scan showing over 600 cc of urine. A Foley catheter was placed. [facility] as [facility] after discharge within 1 to 2 weeks. The family is concerned about taking him home with a Foley in place will arrange for home health to teach him how to empty the Foley catheter.  #9 chronic lumbar radiculopathy patient is followed by neurology.  Restart Robaxin.  He was not on any medications at home prior to admission for pain.  I will start him on tramadol 50 every 6 as needed.  #10?  Opioid withdrawal-there are concerns that patient is taking Patient at home on Percocet prescription filled in July August October and November.  Had Vicodin prescription only in February.  There is concerns that patient and his wife is taking pain pills.  According to the daughter his wife takes his pain pill.  And according to patient he takes 2 tablets when he needs it he is not sure how many he takes.  He should not be discharged home on any narcotics. I have him on clonidine detox protocol and Ativan as needed.   Nutrition Problem: Inadequate oral intake Etiology: nausea, vomiting     Signs/Symptoms: per patient/family report    Interventions: Glucerna shake  Estimated body mass index is 29.4 kg/m as calculated from the following:   Height as of this encounter: 5\' 11"  (1.803 m).   Weight as of this encounter: 95.6 kg. DVT prophylaxis:eliquis  Code Status:full Family Communication  discussed with daughter Jackelyn Poling disposition [place] clinical improvement   Consultants:  GI  Procedures:EGD 01/23/2019 Antimicrobials:None  Subjective:  Patient very groggy after EGD Objective: Vitals:   01/23/19 0842 01/23/19 0850 01/23/19 0900 01/23/19 0932   BP: (!) 125/43 (!) 131/50 139/66 (!) 135/58  Pulse: 79 80 82 82  Resp: (!) 24 18 19 18   Temp:    99.3 F (37.4 C)  TempSrc:    Oral  SpO2: 100% 100% 100% 95%  Weight:      Height:        Intake/Output Summary (Last 24 hours) at 01/23/2019 1322 Last data filed at 01/23/2019 0935 Gross per 24 hour  Intake 516.99 ml  Output 1150 ml  Net -633.01 ml   Filed Weights   01/19/19 2348  Weight: 95.6 kg    Examination:  General exam: Appears calm and comfortable  Respiratory system: decreased breath sounds  to auscultation. Respiratory effort normal. Cardiovascular system: S1 & S2 heard, RRR. No JVD, murmurs, rubs, gallops or clicks. No pedal edema. Gastrointestinal system: Abdomen is nondistended, soft and nontender. No organomegaly or masses felt. Normal bowel sounds heard. Central nervous system: Sleeping after procedure  extremities:1 plus edema Skin: No rashes, lesions or ulcers Psychiatry: Judgement and insight appear normal. Mood & affect appropriate.     Data Reviewed: I have personally reviewed following labs and imaging studies  CBC: Recent Labs  Lab 01/19/19 1514 01/20/19 0540 01/23/19 0526  WBC 6.7 7.1 10.7*  NEUTROABS 4.9  --   --   HGB 13.2 11.8* 10.7*  HCT 40.9 38.4* 33.9*  MCV 96.2 98.7 98.3  PLT 179 191 160   Basic Metabolic Panel: Recent Labs  Lab 01/19/19 1514 01/20/19 0540 01/21/19 0927 01/23/19 0526  NA 142 144 144 142  K 2.8* 3.3* 4.4 4.8  CL 101 106 110 113*  CO2 28 26 26  21*  GLUCOSE 164* 109* 186* 162*  BUN 29* 25* 22 19  CREATININE 3.29* 3.10* 2.67* 2.51*  CALCIUM 8.6* 8.3* 8.0* 7.7*  MG  --  2.2  --   --    GFR: Estimated Creatinine Clearance: 28.2 mL/min (A) (by C-G formula based on SCr of 2.51 mg/dL (H)). Liver Function Tests: Recent Labs  Lab 01/19/19 1514 01/20/19 0540 01/23/19 0526  AST 18 14* 12*  ALT 19 17 13   ALKPHOS 71 66 58  BILITOT 1.7* 1.3* 1.9*  PROT 6.7 6.4* 6.1*  ALBUMIN 3.9 3.6 3.1*   Recent Labs  Lab  01/19/19 1514  LIPASE 26   No results for input(s): AMMONIA in the last 168 hours. Coagulation Profile: No results for input(s): INR, PROTIME in the last 168 hours. Cardiac Enzymes: No results for input(s): CKTOTAL, CKMB, CKMBINDEX, TROPONINI in the last 168 hours. BNP (last 3 results) Recent Labs    08/23/18 1058  PROBNP 3,661*   HbA1C: No results for input(s): HGBA1C in the last 72 hours. CBG: Recent Labs  Lab 01/22/19 1146 01/22/19 1619 01/22/19 2056 01/23/19 0926 01/23/19 1158  GLUCAP 199* 162* 164* 164* 169*   Lipid Profile: No results for input(s): CHOL, HDL, LDLCALC, TRIG, CHOLHDL, LDLDIRECT in the last 72 hours. Thyroid Function Tests: No results for input(s): TSH, T4TOTAL, FREET4, T3FREE, THYROIDAB in the last 72 hours. Anemia Panel: No results for input(s): VITAMINB12, FOLATE, FERRITIN, TIBC, IRON, RETICCTPCT in the last 72 hours. Sepsis Labs: No results for input(s): PROCALCITON, LATICACIDVEN in the last 168 hours.  Recent Results (from the past 240 hour(s))  SARS CORONAVIRUS 2 (  TAT 6-24 HRS) Nasopharyngeal Nasopharyngeal Swab     Status: None   Collection Time: 01/19/19  7:41 PM   Specimen: Nasopharyngeal Swab  Result Value Ref Range Status   SARS Coronavirus 2 NEGATIVE NEGATIVE Final    Comment: (NOTE) SARS-CoV-2 target nucleic acids are NOT DETECTED. The SARS-CoV-2 RNA is generally detectable in upper and lower respiratory specimens during the acute phase of infection. Negative results do not preclude SARS-CoV-2 infection, do not rule out co-infections with other pathogens, and should not be used as the sole basis for treatment or other patient management decisions. Negative results must be combined with clinical observations, patient history, and epidemiological information. The expected result is Negative. Fact Sheet for Patients: SugarRoll.be Fact Sheet for Healthcare Providers:  https://www.woods-[place].com/ This test is not yet approved or cleared by the [place] FDA and  has been authorized for detection and/or diagnosis of SARS-CoV-2 by FDA under an Emergency Use Authorization (EUA). This EUA will remain  in effect (meaning this test can be used) for the duration of the COVID-19 declaration under Section 56 4(b)(1) of the Act, 21 U.S.C. section 360bbb-3(b)(1), unless the authorization is terminated or revoked sooner. Performed at [facility], [place] [address] [place], [place] [address]   MRSA PCR Screening     Status: Abnormal   Collection Time: 01/20/19 10:26 AM   Specimen: Nasal Mucosa; Nasopharyngeal  Result Value Ref Range Status   MRSA by PCR POSITIVE (A) NEGATIVE Final    Comment:        The GeneXpert MRSA Assay (FDA approved for NASAL specimens only), is one component of a comprehensive MRSA colonization surveillance program. It is not intended to diagnose MRSA infection nor to guide or monitor treatment for MRSA infections. RESULT CALLED TO, READ BACK BY AND VERIFIED WITH: DUMAS,N. RN @1228  ON 11.06.2020 BY COHEN,K Performed at [facility], [place] [address], [place], [place] [address]          Radiology Studies: No results found.      Scheduled Meds: . amLODipine  10 mg Oral Daily  . atorvastatin  20 mg Oral Once per day on Mon Wed  . calcitRIOL  0.25 mcg Oral Q M,W,F  . Chlorhexidine Gluconate Cloth  6 each Topical Daily  . cloNIDine  0.1 mg Oral QID   Followed by  . [START ON 01/24/2019] cloNIDine  0.1 mg Oral BH-qamhs   Followed by  . [START ON 01/27/2019] cloNIDine  0.1 mg Oral QAC breakfast  . feeding supplement (GLUCERNA SHAKE)  237 mL Oral TID BM  . finasteride  5 mg Oral QHS  . hydrALAZINE  25 mg Oral BID  . insulin aspart  0-5 Units Subcutaneous QHS  . insulin aspart  0-9 Units Subcutaneous TID WC  . latanoprost  1 drop Both Eyes QHS  . metoprolol tartrate  100 mg Oral  BID  . mupirocin ointment  1 application Nasal BID  . pantoprazole (PROTONIX) IV  40 mg Intravenous Q12H  . pilocarpine  1 drop Both Eyes TID  . predniSONE  5 mg Oral Q breakfast  . tamsulosin  0.4 mg Oral BID   Continuous Infusions: . sodium chloride 50 mL/hr at 01/22/19 1014     LOS: 4 days     Georgette Shell, MD [facility]  If 7PM-7AM, please contact night-coverage [email or web address] Password [email or web address] 01/23/2019, 1:22 PM

## 2019-01-23 NOTE — Progress Notes (Signed)
Physical Therapy Treatment Patient Details Name: Jacob [name] MRN: [id number] DOB: 19-Jan-1941 Today's Date: 01/23/2019    History of Present Illness 78yo male c/o intractable nausea and vomiting of 2 weeks, light-headedness. PMH CHF, A-fib on Eliquis, CKD stage 4, DM with neuropathy, RA, COPD, sarcoidosis, kidney CA, CABGx7, HTN, obesity, chronic DOE, cardiac cath, lumbar surgery    PT Comments    Pt was still groggy from EGD this a.m-unsure of why imminent PT eval was ordered for today. Pt was barely safe enough to attempt mobility. LOB multiple times sitting EOB. He was finally able to stand and pivot from bed to recliner. D/C plan depends on how pt presents over next day or so after anesthesia from procedure has worn off. Based on today's performance, he may need SNF placement.    Follow Up Recommendations  SNF vs Home health PT;Supervision/Assistance - 24 hour; [place]( May need SNF if pt continues to require increased assistance to mobilize)     Equipment Recommendations  3in1 (PT)    Recommendations for Other Services       Precautions / Restrictions Precautions Precautions: Fall Precaution Comments: chronic back pain Restrictions Weight Bearing Restrictions: No    Mobility  Bed Mobility Overal bed mobility: Needs Assistance Bed Mobility: Supine to Sit     Supine to sit: Mod assist;HOB elevated     General bed mobility comments: Assist for trunk. Multiple loses of balance required assist to either prevent LOB or correct LOB. Increased time. Cues for safety.  Transfers Overall transfer level: Needs assistance Equipment used: Rolling walker (2 wheeled) Transfers: Sit to/from Stand Sit to Stand: Min assist         General transfer comment: Assist to rise, stabilize, control descent. VCs safety, technique. Stand pivot, bed to reclliner, using RW.  Ambulation/Gait             General Gait Details: Deferred-unsteady/unsafe on today.   Stairs              Wheelchair Mobility    Modified Rankin (Stroke Patients Only)       Balance Overall balance assessment: Needs assistance         Standing balance support: Bilateral upper extremity supported Standing balance-Leahy Scale: Poor                              Cognition Arousal/Alertness: Awake/alert Behavior During Therapy: WFL for tasks assessed/performed Overall Cognitive Status: Within Functional Limits for tasks assessed                                 General Comments: a bit groggy after EGD this a.m      Exercises      General Comments        Pertinent Vitals/Pain Pain Assessment: Faces Faces Pain Scale: Hurts even more Pain Location: back pain Pain Descriptors / Indicators: Aching;Discomfort;Grimacing Pain Intervention(s): Monitored during session;Repositioned    Home Living                      Prior Function            PT Goals (current goals can now be found in the care plan section) Progress towards PT goals: Progressing toward goals    Frequency    Min 3X/week      PT Plan Current plan remains appropriate  Co-evaluation              AM-PAC PT "6 Clicks" Mobility   Outcome Measure  Help needed turning from your back to your side while in a flat bed without using bedrails?: A Lot Help needed moving from lying on your back to sitting on the side of a flat bed without using bedrails?: A Lot Help needed moving to and from a bed to a chair (including a wheelchair)?: A Little Help needed standing up from a chair using your arms (e.g., wheelchair or bedside chair)?: A Little Help needed to walk in hospital room?: A Little Help needed climbing 3-5 steps with a railing? : A Lot 6 Click Score: 15    End of Session Equipment Utilized During Treatment: Gait belt Activity Tolerance: Patient tolerated treatment well Patient left: in chair;with call bell/phone within reach;with chair alarm  set   PT Visit Diagnosis: Difficulty in walking, not elsewhere classified (R26.2);Unsteadiness on feet (R26.81);Muscle weakness (generalized) (M62.81);Pain Pain - part of body: (back)     Time: 1224-4975 PT Time Calculation (min) (ACUTE ONLY): 19 min  Charges:  $Therapeutic Activity: 8-22 mins                       Weston Anna, PT Acute Rehabilitation Services Pager: [phone] Office: [phone]

## 2019-01-23 NOTE — Progress Notes (Signed)
RN noted bloody red urinary output in collection bag (175mL) and bleeding from foley catheter entry site. MD paged. Received called from MD, advised to continue to monitor and hold IVF for now.

## 2019-01-23 NOTE — Progress Notes (Signed)
Jacob [name] 8:14 AM  Subjective: The patient is seen and examined and case discussed with my partner Dr. Paulita Fujita and he has no new complaints and his hospital computer chart was reviewed Objective: Vital signs stable afebrile no acute distress exam please see preassessment evaluation labs reviewed all stable  Assessment: Nausea vomiting questionable etiology  Plan: Okay to proceed with endoscopy with anesthesia assistance with further work-up and plans pending those findings  [facility]  office [phone] After 5PM or if no answer call [phone]

## 2019-01-23 NOTE — Anesthesia Procedure Notes (Signed)
Procedure Name: MAC Date/Time: 01/23/2019 8:23 AM Performed by: Lollie Sails, CRNA Pre-anesthesia Checklist: Patient identified, Emergency Drugs available, Suction available and Patient being monitored Oxygen Delivery Method: Simple face mask Comments: POM Mask used

## 2019-01-23 NOTE — Care Management Important Message (Signed)
Important Message  Patient Details IM Letter given to [facility] SW to present to the Patient Name: Jacob [name] MRN: [id number] Date of Birth: 01-19-1941   Medicare Important Message Given:  Yes     Kerin Salen 01/23/2019, 12:45 PM

## 2019-01-23 NOTE — Op Note (Signed)
[facility] Patient Name: Jacob [name] Procedure Date: 01/23/2019 MRN: [id number] Attending MD: Clarene Essex , MD Date of Birth: 1940-04-30 CSN: [id number] Age: 78 Admit Type: Inpatient Procedure:                Upper GI endoscopy Indications:              Nausea with vomiting Providers:                Josie Dixon, RN, Cherylynn Ridges, Technician,                            Edman Circle. Jacob Resides CRNA, CRNA, Clarene Essex, MD Referring MD:              Medicines:                Propofol total dose 536 mg IV Complications:            No immediate complications. Estimated Blood Loss:     Estimated blood loss: none. Procedure:                Pre-Anesthesia Assessment:                           - Prior to the procedure, a History and Physical                            was performed, and patient medications and                            allergies were reviewed. The patient's tolerance of                            previous anesthesia was also reviewed. The risks                            and benefits of the procedure and the sedation                            options and risks were discussed with the patient.                            All questions were answered, and informed consent                            was obtained. Prior Anticoagulants: The patient has                            taken Eliquis (apixaban), last dose was 2 days                            prior to procedure. ASA Grade Assessment: III - A                            patient with severe systemic disease. After  reviewing the risks and benefits, the patient was                            deemed in satisfactory condition to undergo the                            procedure.                           After obtaining informed consent, the endoscope was                            passed under direct vision. Throughout the                            procedure, the patient's blood pressure,  pulse, and                            oxygen saturations were monitored continuously. The                            GIF-H190 ([id number]) Olympus gastroscope was                            introduced through the mouth, and advanced to the                            second part of duodenum. The upper GI endoscopy was                            accomplished without difficulty. The patient                            tolerated the procedure well. Scope In: Scope Out: Findings:      The larynx was normal.      Abnormal motility was noted in the distal esophagus. There is spasticity       of the esophageal body. The distal esophagus/lower esophageal sphincter       is spastic, but gives up passage to the endoscope.      Localized, white plaques were found in the upper third of the esophagus.      A single small semi-sessile polyp with no bleeding and no stigmata of       recent bleeding was found on the lesser curvature of the stomach.       Biopsies were taken with a cold forceps for histology.      Diffuse moderate inflammation characterized by congestion (edema) was       found in the entire examined stomach. Biopsies were taken with a cold       forceps for histology.      The duodenal bulb, first portion of the duodenum and second portion of       the duodenum were normal.      The exam was otherwise without abnormality. Impression:               - Normal larynx.                           -  Abnormal esophageal motility, suspicious for                            presbyesophagus.                           - Esophageal plaques were found, suspicious for                            candidiasis.                           - A single gastric polyp. Biopsied.                           - Chronic atrophic gastritis. Biopsied.                           - Normal duodenal bulb, first portion of the                            duodenum and second portion of the duodenum.                           - The  examination was otherwise normal. Moderate Sedation:      Not Applicable - Patient had care per Anesthesia. Recommendation:           - Soft diet today. Await pathology                           - Resume Eliquis (apixaban) at prior dose today or                            tomorrow.                           - Nystatin suspension 100,000 units PO QID for 1                            week.                           - Return to GI clinic PRN.                           - Telephone GI clinic for pathology results in 5                            days.                           - Telephone GI clinic if symptomatic PRN. Consider                            gastric emptying if you thought this was not just a  swallowing problem but if this was a swallowing                            problem consider Botox in the future and also                            consider abdominal CT scan just to be complete Procedure Code(s):        --- Professional ---                           [phone], Esophagogastroduodenoscopy, flexible,                            transoral; with biopsy, single or multiple Diagnosis Code(s):        --- Professional ---                           K22.4, Dyskinesia of esophagus                           K22.9, Disease of esophagus, unspecified                           K31.7, Polyp of stomach and duodenum                           K29.40, Chronic atrophic gastritis without bleeding                           R11.2, Nausea with vomiting, unspecified CPT copyright 2019 [employer]. All rights reserved. The codes documented in this report are preliminary and upon coder review may  be revised to meet current compliance requirements. Clarene Essex, MD 01/23/2019 8:50:13 AM This report has been signed electronically. Number of Addenda: 0

## 2019-01-23 NOTE — Progress Notes (Signed)
RN received called from pt's spouse for update. RN answer any questions spouse had and let pt speak with spouse briefly. No further questions at this time.

## 2019-01-23 NOTE — Anesthesia Postprocedure Evaluation (Signed)
Anesthesia Post Note  Patient: Jacob [name]  Procedure(s) Performed: ESOPHAGOGASTRODUODENOSCOPY (EGD) WITH PROPOFOL (Left )     Patient location during evaluation: PACU Anesthesia Type: MAC Level of consciousness: awake and alert Pain management: pain level controlled Vital Signs Assessment: post-procedure vital signs reviewed and stable Respiratory status: spontaneous breathing, nonlabored ventilation and respiratory function stable Cardiovascular status: stable and blood pressure returned to baseline Postop Assessment: no apparent nausea or vomiting Anesthetic complications: no    Last Vitals:  Vitals:   01/23/19 0850 01/23/19 0900  BP: (!) 131/50 139/66  Pulse: 80 82  Resp: 18 19  Temp:    SpO2: 100% 100%    Last Pain:  Vitals:   01/23/19 0727  TempSrc: Oral  PainSc: 0-No pain                 Catalina Gravel

## 2019-01-24 DIAGNOSIS — E876 Hypokalemia: Secondary | ICD-10-CM

## 2019-01-24 DIAGNOSIS — N179 Acute kidney failure, unspecified: Secondary | ICD-10-CM

## 2019-01-24 LAB — GLUCOSE, CAPILLARY
Glucose-Capillary: 202 mg/dL — ABNORMAL HIGH (ref 70–99)
Glucose-Capillary: 187 mg/dL — ABNORMAL HIGH (ref 70–99)

## 2019-01-24 LAB — SURGICAL PATHOLOGY

## 2019-01-24 NOTE — Progress Notes (Signed)
RN called pt's daughter Hilda Blades) per MD to advised pt is being discharged today. Pts daughter stated she should be him to pick up pt between 3p-4p today.

## 2019-01-24 NOTE — Progress Notes (Signed)
[facility]astroenterology Progress Note  Jacob [name] 78 y.o. 1940-04-16  CC:   Nausea and vomiting.  Subjective: Feeling better today.  Occasional nausea but denies any vomiting.  Denies abdominal pain, diarrhea and constipation.     Objective: Vital signs in last 24 hours: Vitals:   01/23/19 2016 01/24/19 0450  BP: (!) 130/54 137/65  Pulse: 72 79  Resp: 18 18  Temp: 99.4 F (37.4 C) 98.8 F (37.1 C)  SpO2: 93% 93%    Physical Exam:  General.  Well developed.  Resting comfortably Abdomen.  Soft, nontender, nondistended, bowel sounds present.   Lab Results: Recent Labs    01/23/19 0526  NA 142  K 4.8  CL 113*  CO2 21*  GLUCOSE 162*  BUN 19  CREATININE 2.51*  CALCIUM 7.7*   Recent Labs    01/23/19 0526  AST 12*  ALT 13  ALKPHOS 58  BILITOT 1.9*  PROT 6.1*  ALBUMIN 3.1*   Recent Labs    01/23/19 0526  WBC 10.7*  HGB 10.7*  HCT 33.9*  MCV 98.3  PLT 185   No results for input(s): LABPROT, INR in the last 72 hours.    Assessment/Plan: -Recurrent nausea and vomiting.  Could be gastroparesis versus esophageal dysmotility.  EGD yesterday showed esophageal dysmotility, possible Candida esophagitis, atrophic gastritis and small gastric polyp.  Recommendations ------------------------ -Primary team planning to discharge patient as he is feeling better. -Continue soft diet -Follow biopsies -Follow-up in GI clinic in 4 to 6 weeks if continues to have nausea and vomiting. -GI will sign off.  Call us back if needed   Otis Brace MD, [place] 01/24/2019, 11:54 AM  Contact #  [phone]

## 2019-01-24 NOTE — Discharge Summary (Addendum)
Physician Discharge Summary  Jacob [name] BHA:[id number] DOB: [birth year] DOA: 01/19/2019  PCP: Gifford Shave, MD  Admit date: 01/19/2019 Discharge date: 01/24/2019  Admitted From: Home Disposition: Home  Recommendations for Outpatient Follow-up:  1. Follow up with PCP in 1-2 weeks 2. Please obtain BMP/CBC in one week 3. Follow-up with GI Dr. Paulita Fujita for biopsy results 4. Follow-up with [facility] for BPH and Foley  Home Health: PT  equipment/Devices: None Discharge Condition stable and improved CODE STATUS full code  diet recommendation: Cardiac Brief/Interim Summary:78 y.o.malewith medical history significant ofdiastolic heart failure, paroxysmal atrial fibrillation on Eliquis, CKD stage IV, type 2 diabetes with neuropathy, rheumatoid arthritis, COPD, sarcoidosis, obstructive sleep apnea and BPH admitted with complaints of intractable nausea and vomiting for the last 2 weeks. Denies any diarrhea or abdominal pain. Denies any hematemesis melena. Wife reports that he is very lightheaded and unable to stand up. Denies headache or changes with his vision. Denies sick contacts. Patient headache Covid test 3 weeks ago which was negative. Denies chest pain shortness of breath cough. He has [facility] He has no history of gastric ulcer or gastritis. He has chronic back pain radiates to his right leg followed by neurology for lumbar radiculopathy.     Discharge Diagnoses:  Active Problems:   Intractable nausea and vomiting   Other chronic pain    #1 intractable nausea and vomiting-patient had EGD 01/23/2019- Abnormal esophageal motility suspicious for presbyesophagus.  Esophageal plaques suspicious for candidiasis.  Single gastric polyp biopsied.  Chronic atrophic gastritis biopsied.  Normal duodenum. Patient tolerated a regular diet prior to discharge.  Follow-up with GI for biopsy results.  #2 hypokalemia with CKD stage IV  repletedpotassium 4.8   #3 history of stage IV CKD creatinine slightly higher than his normal baseline at the time of admission creatinine 3.29 he mostly runs around 2.97.Creatinine down to 2.57 on 01/23/2019.  #4 dehydrationresolved with IV fluids  #5 hypertension 132/64 continue home meds  #6 type 2 diabetes with neuropathy continue Lantus   #7 history of heart failure with preserved ejection fraction monitor closely .  Continue Lasix.  #8 urinary retention patient with history of BPH on Proscar and Flomax.  Bladder scan 600 cc of urine. He will [facility] as an outpatient after discharge within 1 to 2 weeks.   #9 chronic lumbar radiculopathy patient is followed by neurology. Restart Robaxin. Follow-up with neurosurgery as an outpatient for possible surgical options.  #10?  Opioid withdrawal-patient takes narcotics at home for back pain.  His symptoms were treated with clonidine detox protocol and Ativan.    #10 unsteady gait patient seen by PT recommends SNF.  However patient refused to go to SNF and wants to go home.  He will need physical therapy for gait training transfer training and stair training.  He has an unstable gait due to balance issues and he is unable to leave home without assistance.  Nutrition Problem: Inadequate oral intake Etiology: nausea, vomiting    Signs/Symptoms: per patient/family report     Interventions: Glucerna shake  Estimated body mass index is 29.4 kg/m as calculated from the following:   Height as of this encounter: 5\' 11"  (1.803 m).   Weight as of this encounter: 95.6 kg.  Discharge Instructions  Discharge Instructions    Call MD for:  difficulty breathing, headache or visual disturbances   Complete by: As directed    Call MD for:  persistant  nausea and vomiting   Complete by: As directed    Call MD for:  severe uncontrolled pain   Complete by: As directed    Diet - low sodium heart healthy    Complete by: As directed    Increase activity slowly   Complete by: As directed      Allergies as of 01/24/2019      Reactions   Ace Inhibitors Other (See Comments)   Probably nausea and vomiting per patient   Actonel [risedronate] Nausea And Vomiting   Ciprocinonide [fluocinolone] Other (See Comments)   Probably nausea and vomiting per patient   Flunisolide Other (See Comments)   Probably nausea and vomiting per patient   Metformin And Related Other (See Comments)   Probably nausea and vomiting per patient   Sertraline Other (See Comments)   Probably nausea and vomiting per patient   Sulindac Other (See Comments)   Probably nausea and vomiting per patient   Terazosin Other (See Comments)   Probably nausea and vomiting per patient      Medication List    TAKE these medications   amLODipine 10 MG tablet Commonly known as: NORVASC TAKE 1 TABLET BY MOUTH EVERY DAY   apixaban 5 MG Tabs tablet Commonly known as: ELIQUIS Take 5 mg by mouth 2 (two) times daily.   atorvastatin 20 MG tablet Commonly known as: LIPITOR Take 20mg  (1 tab) 2 days a week What changed:   how much to take  how to take this  when to take this  additional instructions   budesonide-formoterol 160-4.5 MCG/ACT inhaler Commonly known as: SYMBICORT Inhale 2 puffs into the lungs 2 (two) times daily.   calcitRIOL 0.25 MCG capsule Commonly known as: ROCALTROL Take 0.25 mcg by mouth every Monday, Wednesday, and Friday.   cyanocobalamin 1000 MCG tablet Take 1,000 mcg by mouth daily.   denosumab 60 MG/ML Sosy injection Commonly known as: PROLIA Inject 60 mg into the skin every 6 (six) months.   ferrous sulfate 325 (65 FE) MG tablet Take 325 mg by mouth as directed. MWF   finasteride 5 MG tablet Commonly known as: PROSCAR Take 5 mg by mouth daily.   furosemide 40 MG tablet Commonly known as: LASIX Take 1 tablet (40 mg total) by mouth 2 (two) times daily.   hydrALAZINE 25 MG  tablet Commonly known as: APRESOLINE Take 25 mg by mouth 2 (two) times daily.   insulin lispro protamine-lispro (75-25) 100 UNIT/ML Susp injection Commonly known as: HUMALOG 75/25 MIX Inject into the skin 2 (two) times daily with a meal. See sliding scale instructions.   latanoprost 0.005 % ophthalmic solution Commonly known as: XALATAN Place 1 drop into both eyes at bedtime.   loratadine 10 MG tablet Commonly known as: CLARITIN Take 10 mg by mouth daily.   methocarbamol 500 MG tablet Commonly known as: ROBAXIN Take 1 tablet (500 mg total) by mouth every 8 (eight) hours as needed for muscle spasms.   metoprolol tartrate 100 MG tablet Commonly known as: LOPRESSOR Take 100 mg by mouth 2 (two) times daily.   montelukast 10 MG tablet Commonly known as: SINGULAIR Take 1 tablet (10 mg total) by mouth at bedtime.   nitroGLYCERIN 0.4 MG SL tablet Commonly known as: NITROSTAT Place 0.4 mg under the tongue every 5 (five) minutes as needed for chest pain.   nystatin 100000 UNIT/ML suspension Commonly known as: MYCOSTATIN Take 5 mLs (500,000 Units total) by mouth 4 (four) times daily for 7 days.  ondansetron 4 MG disintegrating tablet Commonly known as: Zofran ODT Take 1 tablet (4 mg total) by mouth every 6 (six) hours as needed.   oxyCODONE-acetaminophen 5-325 MG tablet Commonly known as: PERCOCET/ROXICET Take 2 tablets by mouth every 4 (four) hours as needed for severe pain.   pantoprazole 40 MG tablet Commonly known as: Protonix Take 1 tablet (40 mg total) by mouth daily.   pilocarpine 4 % ophthalmic solution Commonly known as: PILOCAR Place 1 drop into both eyes 2 (two) times daily.   predniSONE 5 MG tablet Commonly known as: DELTASONE Take 5 mg by mouth daily with breakfast.   PRESERVISION AREDS 2 PO Take 1 tablet by mouth 2 (two) times daily.   SEMAGLUTIDE(0.25 OR 0.5MG /DOS) [place] Inject 1.5 mLs into the skin once a week. 0.5mg /0.360ml   tamsulosin 0.4 MG Caps  capsule Commonly known as: FLOMAX Take 0.8 mg by mouth at bedtime.      Follow-up Information    Gifford Shave, MD Follow up.   Specialty: Family Medicine Contact information: [address] [address] [phone]        Richardo Priest, MD .   Specialty: Cardiology Contact information: [place] [id number] [phone]        [facility] Follow up.   Contact information: [address]       Arta Silence, MD Follow up.   Specialty: Gastroenterology Contact information: [address] [place] [address] [phone]        Earnie Larsson, MD Follow up.   Specialty: Neurosurgery Contact information: [address] [place] [address] [phone]          Allergies  Allergen Reactions  . Ace Inhibitors Other (See Comments)    Probably nausea and vomiting per patient   . Actonel [Risedronate] Nausea And Vomiting  . Ciprocinonide [Fluocinolone] Other (See Comments)    Probably nausea and vomiting per patient  . Flunisolide Other (See Comments)    Probably nausea and vomiting per patient   . Metformin And Related Other (See Comments)    Probably nausea and vomiting per patient   . Sertraline Other (See Comments)    Probably nausea and vomiting per patient   . Sulindac Other (See Comments)    Probably nausea and vomiting per patient   . Terazosin Other (See Comments)    Probably nausea and vomiting per patient     Consultations: [facility]  Procedures/Studies: Dg Lumbar Spine Complete  Result Date: 01/08/2019 CLINICAL DATA:  Hip and back pain EXAM: LUMBAR SPINE - COMPLETE 4+ VIEW COMPARISON:  MRI October 02, 2015 FINDINGS: There is worsening spondylosis seen at L3-L4 with endplate sclerosis and flattening of the intervertebral disc space with large anterior osteophytes. There is neural foraminal narrowing seen.  There is also disc height loss with facet arthrosis and neural foraminal narrowing at L4-L5 and L5-S1. No definite acute fracture is seen. IMPRESSION: No definite acute fracture. Worsening lumbar spine spondylosis at L3-L4. Electronically Signed   By: Prudencio Pair M.D.   On: 01/08/2019 03:34   Dg Chest Port 1 View  Result Date: 01/19/2019 CLINICAL DATA:  Failure to thrive EXAM: PORTABLE CHEST 1 VIEW COMPARISON:  Radiograph November 01, 2018, CT Jul 27, 2015 FINDINGS: Postsurgical changes related to prior CABG including intact and aligned sternotomy wires and multiple surgical clips projecting over the mediastinum. Stable cardiomegaly. Calcified aorta is again  noted. Central pulmonary arterial enlargement is suggestive of chronic pulmonary artery hypertension, better seen on comparison CTs. Few scattered calcified granulomata are again seen in the lungs. Hazy basilar atelectatic changes are noted. No convincing features of edema. No consolidative process, pneumothorax or effusion. Degenerative changes are present in the imaged spine and shoulders. IMPRESSION: 1. Atelectasis, otherwise no acute cardiopulmonary disease. 2. Postsurgical changes related to prior CABG. 3. Findings suggestive of chronic pulmonary artery hypertension, better seen on comparison CT. Electronically Signed   By: Lovena Le M.D.   On: 01/19/2019 15:30   Dg Hip Unilat W Or Wo Pelvis 2-3 Views Right  Result Date: 01/08/2019 CLINICAL DATA:  Fall and hip pain EXAM: DG HIP (WITH OR WITHOUT PELVIS) 2-3V RIGHT COMPARISON:  None. FINDINGS: There is diffuse osteopenia. No definite fracture is seen. Moderate bilateral hip osteoarthritis is seen. Scattered dense vascular calcifications. IMPRESSION: No definite acute osseous abnormality, however diffuse osteopenia limits evaluation. Electronically Signed   By: Prudencio Pair M.D.   On: 01/08/2019 03:28    (Echo, Carotid, EGD, Colonoscopy, ERCP)    Subjective:  Resting in bed denies any  shortness of breath or chest pain tolerated breakfast this morning Discharge Exam: Vitals:   01/23/19 2016 01/24/19 0450  BP: (!) 130/54 137/65  Pulse: 72 79  Resp: 18 18  Temp: 99.4 F (37.4 C) 98.8 F (37.1 C)  SpO2: 93% 93%   Vitals:   01/23/19 0932 01/23/19 1400 01/23/19 2016 01/24/19 0450  BP: (!) 135/58 132/64 (!) 130/54 137/65  Pulse: 82 77 72 79  Resp: 18 20 18 18   Temp: 99.3 F (37.4 C) 99.9 F (37.7 C) 99.4 F (37.4 C) 98.8 F (37.1 C)  TempSrc: Oral Oral Oral Oral  SpO2: 95% 93% 93% 93%  Weight:      Height:        General: Pt is alert, awake, not in acute distress Cardiovascular: RRR, S1/S2 +, no rubs, no gallops Respiratory: Decreased breath sounds at the bases bilaterally, no wheezing, no rhonchi Abdominal: Soft, NT, ND, bowel sounds + Extremities: 1+ edema bilaterally   The results of significant diagnostics from this hospitalization (including imaging, microbiology, ancillary and laboratory) are listed below for reference.     Microbiology: Recent Results (from the past 240 hour(s))  SARS CORONAVIRUS 2 (TAT 6-24 HRS) Nasopharyngeal Nasopharyngeal Swab     Status: None   Collection Time: 01/19/19  7:41 PM   Specimen: Nasopharyngeal Swab  Result Value Ref Range Status   SARS Coronavirus 2 NEGATIVE NEGATIVE Final    Comment: (NOTE) SARS-CoV-2 target nucleic acids are NOT DETECTED. The SARS-CoV-2 RNA is generally detectable in upper and lower respiratory specimens during the acute phase of infection. Negative results do not preclude SARS-CoV-2 infection, do not rule out co-infections with other pathogens, and should not be used as the sole basis for treatment or other patient management decisions. Negative results must be combined with clinical observations, patient history, and epidemiological information. The expected result is Negative. Fact Sheet for Patients: SugarRoll.be Fact Sheet for Healthcare  Providers: https://www.woods-.com/ This test is not yet approved or cleared by the [place] FDA and  has been authorized for detection and/or diagnosis of SARS-CoV-2 by FDA under an Emergency Use Authorization (EUA). This EUA will remain  in effect (meaning this test can be used) for the duration of the COVID-19 declaration under Section 56 4(b)(1) of the Act, 21 U.S.C. section 360bbb-3(b)(1), unless the authorization is terminated or revoked sooner. Performed at [facility]  [facility], [place] [address] [place], [place] [address]   MRSA PCR Screening     Status: Abnormal   Collection Time: 01/20/19 10:26 AM   Specimen: Nasal Mucosa; Nasopharyngeal  Result Value Ref Range Status   MRSA by PCR POSITIVE (A) NEGATIVE Final    Comment:        The GeneXpert MRSA Assay (FDA approved for NASAL specimens only), is one component of a comprehensive MRSA colonization surveillance program. It is not intended to diagnose MRSA infection nor to guide or monitor treatment for MRSA infections. RESULT CALLED TO, READ BACK BY AND VERIFIED WITH: DUMAS,N. RN @1228  ON 11.06.2020 BY COHEN,K Performed at [facility], [place] [address] [place], [place] [address]      Labs: BNP (last 3 results) Recent Labs    10/31/18 1139 11/01/18 1611 01/19/19 1514  BNP 1,473.5* 1,675.9* 628.3*   Basic Metabolic Panel: Recent Labs  Lab 01/19/19 1514 01/20/19 0540 01/21/19 0927 01/23/19 0526  NA 142 144 144 142  K 2.8* 3.3* 4.4 4.8  CL 101 106 110 113*  CO2 28 26 26  21*  GLUCOSE 164* 109* 186* 162*  BUN 29* 25* 22 19  CREATININE 3.29* 3.10* 2.67* 2.51*  CALCIUM 8.6* 8.3* 8.0* 7.7*  MG  --  2.2  --   --    Liver Function Tests: Recent Labs  Lab 01/19/19 1514 01/20/19 0540 01/23/19 0526  AST 18 14* 12*  ALT 19 17 13   ALKPHOS 71 66 58  BILITOT 1.7* 1.3* 1.9*  PROT 6.7 6.4* 6.1*  ALBUMIN 3.9 3.6 3.1*   Recent Labs  Lab 01/19/19 1514  LIPASE 26   No  results for input(s): AMMONIA in the last 168 hours. CBC: Recent Labs  Lab 01/19/19 1514 01/20/19 0540 01/23/19 0526  WBC 6.7 7.1 10.7*  NEUTROABS 4.9  --   --   HGB 13.2 11.8* 10.7*  HCT 40.9 38.4* 33.9*  MCV 96.2 98.7 98.3  PLT 179 191 185   Cardiac Enzymes: No results for input(s): CKTOTAL, CKMB, CKMBINDEX, TROPONINI in the last 168 hours. BNP: Invalid input(s): POCBNP CBG: Recent Labs  Lab 01/23/19 0926 01/23/19 1158 01/23/19 1602 01/23/19 2150 01/24/19 0759  GLUCAP 164* 169* 161* 134* 202*   D-Dimer No results for input(s): DDIMER in the last 72 hours. Hgb A1c No results for input(s): HGBA1C in the last 72 hours. Lipid Profile No results for input(s): CHOL, HDL, LDLCALC, TRIG, CHOLHDL, LDLDIRECT in the last 72 hours. Thyroid function studies No results for input(s): TSH, T4TOTAL, T3FREE, THYROIDAB in the last 72 hours.  Invalid input(s): FREET3 Anemia work up No results for input(s): VITAMINB12, FOLATE, FERRITIN, TIBC, IRON, RETICCTPCT in the last 72 hours. Urinalysis    Component Value Date/Time   COLORURINE YELLOW 01/20/2019 1113   APPEARANCEUR CLEAR 01/20/2019 1113   LABSPEC 1.012 01/20/2019 1113   PHURINE 6.0 01/20/2019 1113   GLUCOSEU NEGATIVE 01/20/2019 1113   HGBUR NEGATIVE 01/20/2019 1113   BILIRUBINUR NEGATIVE 01/20/2019 1113   BILIRUBINUR neg 09/09/2013 1233   KETONESUR 5 (A) 01/20/2019 1113   PROTEINUR 100 (A) 01/20/2019 1113   UROBILINOGEN 0.2 09/09/2013 1233   NITRITE NEGATIVE 01/20/2019 1113   LEUKOCYTESUR NEGATIVE 01/20/2019 1113   Sepsis Labs Invalid input(s): PROCALCITONIN,  WBC,  LACTICIDVEN Microbiology Recent Results (from the past 240 hour(s))  SARS CORONAVIRUS 2 (TAT 6-24 HRS) Nasopharyngeal Nasopharyngeal Swab     Status: None   Collection Time: 01/19/19  7:41 PM   Specimen: Nasopharyngeal  Swab  Result Value Ref Range Status   SARS Coronavirus 2 NEGATIVE NEGATIVE Final    Comment: (NOTE) SARS-CoV-2 target nucleic acids are  NOT DETECTED. The SARS-CoV-2 RNA is generally detectable in upper and lower respiratory specimens during the acute phase of infection. Negative results do not preclude SARS-CoV-2 infection, do not rule out co-infections with other pathogens, and should not be used as the sole basis for treatment or other patient management decisions. Negative results must be combined with clinical observations, patient history, and epidemiological information. The expected result is Negative. Fact Sheet for Patients: SugarRoll.be Fact Sheet for Healthcare Providers: https://www.woods-[place].com/ This test is not yet approved or cleared by the [place] FDA and  has been authorized for detection and/or diagnosis of SARS-CoV-2 by FDA under an Emergency Use Authorization (EUA). This EUA will remain  in effect (meaning this test can be used) for the duration of the COVID-19 declaration under Section 56 4(b)(1) of the Act, 21 U.S.C. section 360bbb-3(b)(1), unless the authorization is terminated or revoked sooner. Performed at [facility], [place] [address], [place], [place] [address]   MRSA PCR Screening     Status: Abnormal   Collection Time: 01/20/19 10:26 AM   Specimen: Nasal Mucosa; Nasopharyngeal  Result Value Ref Range Status   MRSA by PCR POSITIVE (A) NEGATIVE Final    Comment:        The GeneXpert MRSA Assay (FDA approved for NASAL specimens only), is one component of a comprehensive MRSA colonization surveillance program. It is not intended to diagnose MRSA infection nor to guide or monitor treatment for MRSA infections. RESULT CALLED TO, READ BACK BY AND VERIFIED WITH: DUMAS,N. RN @1228  ON 11.06.2020 BY COHEN,K Performed at [facility], [place] [address] [place], [name] [address]      Time coordinating discharge:  39 minutes  SIGNED:   Georgette Shell, MD  [facility] 01/24/2019, 11:48 AM Pager    If 7PM-7AM, please contact night-coverage [email or web address] Password [email or web address]

## 2019-01-24 NOTE — Progress Notes (Signed)
Physical Therapy Treatment Patient Details Name: Jacob [name] MRN: [id number] DOB: [birth year] Today's Date: 01/24/2019    History of Present Illness 78yo male c/o intractable nausea and vomiting of 2 weeks, light-headedness. PMH CHF, A-fib on Eliquis, CKD stage 4, DM with neuropathy, RA, COPD, sarcoidosis, kidney CA, CABGx7, HTN, obesity, chronic DOE, cardiac cath, lumbar surgery    PT Comments    Pt was willing to get up and go on a short walk today. Pt c/o back and R sided pain during treatment. General bed mobility comments: Pt required HHA to sit up in bed with HOB elevated. Once sitting pt had LOB x2. Pt c/o back pain and pain on the right side of his body General transfer comment: Pt was able to stand with minimal assitance but required CGA for steadying. Pt was reminded to push up from the bed and not to pull on the RW General Gait Details: Pt was able to ambulate 73ft with RW Pt c/o back pain while walking and was limited by pain and fatigue We attempted to put Pt in chair but Pt stated he was in too much pain and wanted to go back to bed.    Follow Up Recommendations  SNF;Home health PT;Supervision/Assistance - 24 hour     Equipment Recommendations  3in1 (PT)    Recommendations for Other Services       Precautions / Restrictions Precautions Precautions: Fall Precaution Comments: chronic back pain Restrictions Weight Bearing Restrictions: No    Mobility  Bed Mobility Overal bed mobility: Needs Assistance Bed Mobility: Supine to Sit     Supine to sit: Mod assist;HOB elevated     General bed mobility comments: Pt required HHA to sit up in bed with HOB elevated. Once sitting pt had LOB x2. Pt c/o back pain and pain on the right side of his body  Transfers Overall transfer level: Needs assistance Equipment used: Rolling walker (2 wheeled) Transfers: Sit to/from Stand Sit to Stand: Min assist         General transfer comment: Pt was able to stand with minimal  assitance but required CGA for steadying. Pt was reminded to push up from the bed and not to pull on the RW  Ambulation/Gait Ambulation/Gait assistance: Min guard Gait Distance (Feet): 35 Feet Assistive device: Rolling walker (2 wheeled) Gait Pattern/deviations: Step-through pattern;Decreased step length - right;Decreased step length - left;Decreased stride length;Decreased dorsiflexion - right;Decreased dorsiflexion - left;Trunk flexed     General Gait Details: Pt was able to ambulate 69ft with RW Pt c/o back pain while walking and was limited by pain and fatigue   Stairs             Wheelchair Mobility    Modified Rankin (Stroke Patients Only)       Balance                                            Cognition Arousal/Alertness: Awake/alert Behavior During Therapy: WFL for tasks assessed/performed Overall Cognitive Status: Within Functional Limits for tasks assessed                                        Exercises      General Comments        Pertinent Vitals/Pain Pain Assessment: 0-10  Pain Score: 8  Pain Location: back pain Pain Descriptors / Indicators: Aching;Discomfort;Grimacing Pain Intervention(s): Monitored during session;Repositioned;RN gave pain meds during session    Home Living                      Prior Function            PT Goals (current goals can now be found in the care plan section) Progress towards PT goals: Progressing toward goals    Frequency    Min 3X/week      PT Plan Current plan remains appropriate    Co-evaluation              AM-PAC PT "6 Clicks" Mobility   Outcome Measure  Help needed turning from your back to your side while in a flat bed without using bedrails?: A Lot Help needed moving from lying on your back to sitting on the side of a flat bed without using bedrails?: A Lot Help needed moving to and from a bed to a chair (including a wheelchair)?: A  Little Help needed standing up from a chair using your arms (e.g., wheelchair or bedside chair)?: A Little Help needed to walk in hospital room?: A Little Help needed climbing 3-5 steps with a railing? : A Lot 6 Click Score: 15    End of Session Equipment Utilized During Treatment: Gait belt Activity Tolerance: Patient limited by pain;Patient limited by fatigue Patient left: in bed;with bed alarm set;with call bell/phone within reach;with nursing/sitter in room   PT Visit Diagnosis: Difficulty in walking, not elsewhere classified (R26.2);Unsteadiness on feet (R26.81);Muscle weakness (generalized) (M62.81);Pain     Time: 5681-2751 PT Time Calculation (min) (ACUTE ONLY): 24 min  Charges:  $Gait Training: 8-22 mins $Therapeutic Activity: 8-22 mins                     Excell Seltzer, [facility]

## 2019-01-24 NOTE — Progress Notes (Signed)
Pt being discharged home with home health. Daughter notified. Discharge instructions including medications and follow up appts given. Foley care supplies provided. Pt had not further questions at this time.

## 2019-01-26 ENCOUNTER — Encounter (HOSPITAL_COMMUNITY): Payer: Self-pay | Admitting: Gastroenterology

## 2019-01-26 ENCOUNTER — Other Ambulatory Visit: Payer: Self-pay

## 2019-01-26 NOTE — Patient Outreach (Signed)
  [facility]) Care Management Chronic Special Needs Program  01/26/2019  Name: Jacob [name] DOB: 05/05/1940  MRN: [id number]  Jacob [name] is enrolled in a chronic special needs plan for Diabetes. Reviewed and updated care plan.  Patient was admitted on 01/19/19 nausea/ vomiting and weakness. Patient discharged on 01/24/19 Transition of care call to be completed by [facility] utilization management team and RNCM will follow up per their recommendations.  Goals Addressed            This Visit's Progress   . General - Client will not be readmitted within 30 days (C-SNP)       Client discharged on 01/24/2019 Please follow discharge instructions and call provider if you have any questions. Please attend all follow up appointments as scheduled. Please take your medications as prescribed. Please call 24 Hour nurse advice line as needed [phone]).        Plan:  Send  outreach letter with a copy of their individualized care plan  Chronic care management coordinator will outreach within 1 month or sooner if needed.    Quinn Plowman RN,BSN,CCM [place] [facility] [phone]    .

## 2019-01-28 DIAGNOSIS — R338 Other retention of urine: Secondary | ICD-10-CM | POA: Diagnosis not present

## 2019-01-28 DIAGNOSIS — D869 Sarcoidosis, unspecified: Secondary | ICD-10-CM | POA: Diagnosis not present

## 2019-01-28 DIAGNOSIS — M5116 Intervertebral disc disorders with radiculopathy, lumbar region: Secondary | ICD-10-CM | POA: Diagnosis not present

## 2019-01-28 DIAGNOSIS — N401 Enlarged prostate with lower urinary tract symptoms: Secondary | ICD-10-CM | POA: Diagnosis not present

## 2019-01-28 DIAGNOSIS — I5032 Chronic diastolic (congestive) heart failure: Secondary | ICD-10-CM | POA: Diagnosis not present

## 2019-01-28 DIAGNOSIS — E1142 Type 2 diabetes mellitus with diabetic polyneuropathy: Secondary | ICD-10-CM | POA: Diagnosis not present

## 2019-01-28 DIAGNOSIS — G4733 Obstructive sleep apnea (adult) (pediatric): Secondary | ICD-10-CM | POA: Diagnosis not present

## 2019-01-28 DIAGNOSIS — F419 Anxiety disorder, unspecified: Secondary | ICD-10-CM | POA: Diagnosis not present

## 2019-01-28 DIAGNOSIS — M8580 Other specified disorders of bone density and structure, unspecified site: Secondary | ICD-10-CM | POA: Diagnosis not present

## 2019-01-28 DIAGNOSIS — M109 Gout, unspecified: Secondary | ICD-10-CM | POA: Diagnosis not present

## 2019-01-28 DIAGNOSIS — M1990 Unspecified osteoarthritis, unspecified site: Secondary | ICD-10-CM | POA: Diagnosis not present

## 2019-01-28 DIAGNOSIS — M069 Rheumatoid arthritis, unspecified: Secondary | ICD-10-CM | POA: Diagnosis not present

## 2019-01-28 DIAGNOSIS — D631 Anemia in chronic kidney disease: Secondary | ICD-10-CM | POA: Diagnosis not present

## 2019-01-28 DIAGNOSIS — G8929 Other chronic pain: Secondary | ICD-10-CM | POA: Diagnosis not present

## 2019-01-28 DIAGNOSIS — M48061 Spinal stenosis, lumbar region without neurogenic claudication: Secondary | ICD-10-CM | POA: Diagnosis not present

## 2019-01-28 DIAGNOSIS — F4329 Adjustment disorder with other symptoms: Secondary | ICD-10-CM | POA: Diagnosis not present

## 2019-01-28 DIAGNOSIS — E1122 Type 2 diabetes mellitus with diabetic chronic kidney disease: Secondary | ICD-10-CM | POA: Diagnosis not present

## 2019-01-28 DIAGNOSIS — N184 Chronic kidney disease, stage 4 (severe): Secondary | ICD-10-CM | POA: Diagnosis not present

## 2019-01-28 DIAGNOSIS — I130 Hypertensive heart and chronic kidney disease with heart failure and stage 1 through stage 4 chronic kidney disease, or unspecified chronic kidney disease: Secondary | ICD-10-CM | POA: Diagnosis not present

## 2019-01-28 DIAGNOSIS — I480 Paroxysmal atrial fibrillation: Secondary | ICD-10-CM | POA: Diagnosis not present

## 2019-01-28 DIAGNOSIS — M4726 Other spondylosis with radiculopathy, lumbar region: Secondary | ICD-10-CM | POA: Diagnosis not present

## 2019-01-28 DIAGNOSIS — M353 Polymyalgia rheumatica: Secondary | ICD-10-CM | POA: Diagnosis not present

## 2019-01-28 DIAGNOSIS — I2510 Atherosclerotic heart disease of native coronary artery without angina pectoris: Secondary | ICD-10-CM | POA: Diagnosis not present

## 2019-01-28 DIAGNOSIS — J449 Chronic obstructive pulmonary disease, unspecified: Secondary | ICD-10-CM | POA: Diagnosis not present

## 2019-01-30 ENCOUNTER — Telehealth: Payer: Self-pay | Admitting: *Deleted

## 2019-01-30 NOTE — Telephone Encounter (Signed)
Pt was released form the hospital and they were advised to call and see if/when they needed to follow-up with PCP.  To Dr. Caron Presume. Christen Bame, CMA

## 2019-01-31 ENCOUNTER — Other Ambulatory Visit: Payer: Self-pay

## 2019-01-31 ENCOUNTER — Telehealth: Payer: Self-pay | Admitting: Family Medicine

## 2019-01-31 DIAGNOSIS — G8929 Other chronic pain: Secondary | ICD-10-CM | POA: Diagnosis not present

## 2019-01-31 DIAGNOSIS — I480 Paroxysmal atrial fibrillation: Secondary | ICD-10-CM | POA: Diagnosis not present

## 2019-01-31 DIAGNOSIS — M8580 Other specified disorders of bone density and structure, unspecified site: Secondary | ICD-10-CM | POA: Diagnosis not present

## 2019-01-31 DIAGNOSIS — N184 Chronic kidney disease, stage 4 (severe): Secondary | ICD-10-CM | POA: Diagnosis not present

## 2019-01-31 DIAGNOSIS — D631 Anemia in chronic kidney disease: Secondary | ICD-10-CM | POA: Diagnosis not present

## 2019-01-31 DIAGNOSIS — M353 Polymyalgia rheumatica: Secondary | ICD-10-CM | POA: Diagnosis not present

## 2019-01-31 DIAGNOSIS — E1122 Type 2 diabetes mellitus with diabetic chronic kidney disease: Secondary | ICD-10-CM | POA: Diagnosis not present

## 2019-01-31 DIAGNOSIS — R338 Other retention of urine: Secondary | ICD-10-CM | POA: Diagnosis not present

## 2019-01-31 DIAGNOSIS — I5032 Chronic diastolic (congestive) heart failure: Secondary | ICD-10-CM | POA: Diagnosis not present

## 2019-01-31 DIAGNOSIS — D869 Sarcoidosis, unspecified: Secondary | ICD-10-CM | POA: Diagnosis not present

## 2019-01-31 DIAGNOSIS — E1142 Type 2 diabetes mellitus with diabetic polyneuropathy: Secondary | ICD-10-CM | POA: Diagnosis not present

## 2019-01-31 DIAGNOSIS — J449 Chronic obstructive pulmonary disease, unspecified: Secondary | ICD-10-CM | POA: Diagnosis not present

## 2019-01-31 DIAGNOSIS — G4733 Obstructive sleep apnea (adult) (pediatric): Secondary | ICD-10-CM | POA: Diagnosis not present

## 2019-01-31 DIAGNOSIS — M1990 Unspecified osteoarthritis, unspecified site: Secondary | ICD-10-CM | POA: Diagnosis not present

## 2019-01-31 DIAGNOSIS — M069 Rheumatoid arthritis, unspecified: Secondary | ICD-10-CM | POA: Diagnosis not present

## 2019-01-31 DIAGNOSIS — M4726 Other spondylosis with radiculopathy, lumbar region: Secondary | ICD-10-CM | POA: Diagnosis not present

## 2019-01-31 DIAGNOSIS — M5116 Intervertebral disc disorders with radiculopathy, lumbar region: Secondary | ICD-10-CM | POA: Diagnosis not present

## 2019-01-31 DIAGNOSIS — F4329 Adjustment disorder with other symptoms: Secondary | ICD-10-CM | POA: Diagnosis not present

## 2019-01-31 DIAGNOSIS — N401 Enlarged prostate with lower urinary tract symptoms: Secondary | ICD-10-CM | POA: Diagnosis not present

## 2019-01-31 DIAGNOSIS — I130 Hypertensive heart and chronic kidney disease with heart failure and stage 1 through stage 4 chronic kidney disease, or unspecified chronic kidney disease: Secondary | ICD-10-CM | POA: Diagnosis not present

## 2019-01-31 DIAGNOSIS — M109 Gout, unspecified: Secondary | ICD-10-CM | POA: Diagnosis not present

## 2019-01-31 DIAGNOSIS — I2510 Atherosclerotic heart disease of native coronary artery without angina pectoris: Secondary | ICD-10-CM | POA: Diagnosis not present

## 2019-01-31 DIAGNOSIS — F419 Anxiety disorder, unspecified: Secondary | ICD-10-CM | POA: Diagnosis not present

## 2019-01-31 DIAGNOSIS — M48061 Spinal stenosis, lumbar region without neurogenic claudication: Secondary | ICD-10-CM | POA: Diagnosis not present

## 2019-01-31 NOTE — Patient Outreach (Signed)
  [facility]) Care Management Chronic Special Needs Program    01/31/2019  Name: Jacob [name], DOB: 11/30/1940  MRN: [id number]   [name] is enrolled in a chronic special needs plan for Heart Failure. Update received from utilization management that EMMI red flag notification was received on client for lost of interest in things since last hospital admission/ discharge.   Client is engaged with [facility].  PLAN:  RNCM sent notification to [facility] of clients EMMI red flag notification for follow up.    Quinn Plowman RN,BSN,CCM [place] Management [phone]

## 2019-01-31 NOTE — Telephone Encounter (Signed)
Pt's daughter Hilda Blades is calling and would like for Dr. Caron Presume to call her to discuss her fathers medical records. Pt states that she brought a paper copy of the last year worth of records from his [place] office. Pt is now needing a portion of these records for another specialist. Pt daughter states that the [place] will only release the records as a paper copy and only release the records once a year.  Hilda Blades said she explained to Dr. Scherrie November she would need a copy of the records back due to only being able to have this paper copy.   She states that Dr. Cordella Register said after they were reviewed he would let Hilda Blades know when she could pick up the copy of his records.  Hilda Blades is very understanding that he will need time to review these and would like to know when she would be able to receive a copy of the records.   The best call back number is [phone]

## 2019-02-01 ENCOUNTER — Telehealth: Payer: Self-pay

## 2019-02-01 ENCOUNTER — Telehealth: Payer: Self-pay | Admitting: Cardiology

## 2019-02-01 NOTE — Telephone Encounter (Signed)
Spoke with Jenny Reichmann, home health nurse and gave verbal orders to continue home health per recent hospital discharge.

## 2019-02-01 NOTE — Telephone Encounter (Signed)
I attempted to call Neoma Laming this morning regarding the records and the appointment.  Was unable to reach her so called Mr. Claretha Cooper cell phone.  Spoke with Mr. Savant wife while Mr. Zellars was in the background.  Informed her that I was working on the records and sifting through what needed to be scanned in.  She says that they have an appointment and need the records back before November 30 so I will go today and try to finish up copying what needs to be scanned in.  I will call them when I am done so that they can come pick up the records.  I would like Mr. Henk to follow-up from his hospital visit if needed.  If he is doing well from his hospital visit and has no concerns and there is no need for him to come to the office.

## 2019-02-01 NOTE — Telephone Encounter (Signed)
Jenny Reichmann, home health nurse, calls nurse line requesting verbal orders for home health nursing.   1x a week for 4 weeks. 1x a month there after.  VM can be left at [phone].

## 2019-02-01 NOTE — Telephone Encounter (Signed)
Dianah Field, home health physical therapist, calls nurse line requesting verbal orders for home health PT.  2x a week for 4 weeks.  Dianah Field [phone], ok to LVM.

## 2019-02-01 NOTE — Telephone Encounter (Signed)
Called and spoke with patient's wife, Jacob [name], per DPR who is asking if patient should continue taking furosemide 40 mg twice daily. Patient was discharged from [facility] on 01/24/2019. He has a history of kidney disease and his creatinine was 2.51 on 01/23/2019. Patient has no complaints at this time. Please advise if any changes to furosemide should be made. Thanks!

## 2019-02-01 NOTE — Telephone Encounter (Signed)
Spoke with 4 on the phone.  She says patient is having reduced endurance, difficulty ambulating, increased weakness since his hospitalization which is why she recommends this physical therapy schedule.  Verbal orders given over the phone.

## 2019-02-01 NOTE — Telephone Encounter (Signed)
Should patient still take two water pills? Please advise

## 2019-02-02 ENCOUNTER — Other Ambulatory Visit: Payer: Self-pay

## 2019-02-02 NOTE — Telephone Encounter (Signed)
Patient informed to continue taking furosemide 40 mg twice daily since this is how he was taking this medication before his hospitalization. Patient is agreeable and verbalized understanding. No further questions.

## 2019-02-02 NOTE — Patient Outreach (Signed)
  [facility]) Care Management Chronic Special Needs Program   02/02/2019  Name: Jacob [name], DOB: [birth year]  MRN: [id number]  The client was discussed in today's interdisciplinary care team meeting.  The following issues were discussed:  Client's needs, Changes in health status, Care Plan, Coordination of care and Care transitions  Participants present:  Thea Silversmith, MSN, RN, CCM   Melissa Sandlin RN,BSN,CCM, CDE  Quinn Plowman RN, BSN, CCM Kelli Churn, RN, CCM, CDE  Maryella Shivers, MD  Gilda Crease, PharmD, RPh Bary Castilla, RN, BSN, MS, CCM Coralie Carpen, MD [facility] RN Teresa Pelton RN, BSN  Client is actively engaged with [facility] for complex community care management. [facility] will continue to follow client for transition of care.   Plan:  RNCM will continue care coordination with [facility] as needed.  RNCM will continue to follow as client's CSNP chronic care management coordinator.  Quinn Plowman RN,BSN,CCM [place] [facility] [phone]

## 2019-02-02 NOTE — Telephone Encounter (Signed)
I would have him take his usual dose of diuretic now that he is home

## 2019-02-06 ENCOUNTER — Ambulatory Visit: Payer: HMO | Admitting: Family

## 2019-02-08 ENCOUNTER — Ambulatory Visit: Payer: Self-pay

## 2019-02-13 DIAGNOSIS — M48062 Spinal stenosis, lumbar region with neurogenic claudication: Secondary | ICD-10-CM | POA: Diagnosis not present

## 2019-02-20 ENCOUNTER — Ambulatory Visit: Payer: Self-pay

## 2019-02-22 ENCOUNTER — Ambulatory Visit: Payer: Self-pay

## 2019-02-27 NOTE — Progress Notes (Signed)
Cardiology Office Note:    Date:  02/28/2019   ID:  Jacob [name], DOB 08-Jul-1940, MRN [id number]  PCP:  Gifford Shave, MD  Cardiologist:  Shirlee More, MD    Referring MD: [facility], [facility]    ASSESSMENT:    1. Paroxysmal atrial fibrillation (HCC)   2. Chronic anticoagulation   3. Hypertensive heart disease with chronic diastolic congestive heart failure (HCC)   4. Stage 4 chronic kidney disease ([place])   5. Coronary artery disease of native artery of native heart with stable angina pectoris ([place])   6. Dyslipidemia    PLAN:    In order of problems listed above:  1. Stable off amiodarone I would not resume in retrospect I do not think he had amiodarone lung toxicity.  He will continue anticoagulation 2. BP at target continue current treatment heart failure is compensated continue his current diuretic twice daily 3. Stable followed by nephrology [facility] 4. Stable CAD continue medical treatment 5. Stable continue his high intensity statin   Next appointment: 6 months   Medication Adjustments/Labs and Tests Ordered: Current medicines are reviewed at length with the patient today.  Concerns regarding medicines are outlined above.  Orders Placed This Encounter  Procedures  . EKG 12-Lead   No orders of the defined types were placed in this encounter.   Chief Complaint  Patient presents with  . Follow-up  . Atrial Fibrillation    History of Present Illness:    Jacob [name] is a 78 y.o. male with a hx of CAD s/p CABGx7 1997, DM2, HTN, HF, PAF on amiodarone & chronic anticoagulation, CKD, COPD, OSA  last seen 08/23/2018.  There was a call to our office 12/29/2018 the patient's wife related that his pulmonologist recommended discontinuing amiodarone and  he was instructed and stopped same day. He was last seen 01/03/2019. Compliance with diet, lifestyle and medications: Yes  He is frustrated he has been off his amiodarone for close to 2 months and has  noticed no improvement.  He had extensive pulmonary test done waiting for the report and he stopped using his bronchodilators.  He remains short of breath with more than usual activities but no edema orthopnea chest pain palpitation or syncope.  Is no indication has had recurrent atrial fibrillation his EKG today shows sinus rhythm bifascicular heart block.  I asked him to monitor his heart rhythm with digital device at home and contact me if he gets a warning for irregular heartbeat.  At this time I would not put him on another antiarrhythmic drug. Past Medical History:  Diagnosis Date  . Allergy   . Anemia   . Anxiety   . Arthritis   . Asthma   . BPH (benign prostatic hyperplasia)   . Cancer of kidney ([place])   . Cataract   . CHF (congestive heart failure) ([place])   . Chronic kidney disease    chronic  kidney failure  kidney function at 42%  . COPD (chronic obstructive pulmonary disease) ([place])   . Coronary artery disease    CABG  7 bypasses  . Diabetes mellitus without complication ([place])   . Facial cellulitis   . GERD (gastroesophageal reflux disease)   . Gout   . Hepatitis    many years ago  . Hyperlipidemia   . Hypertension   . Lumbar disc disease   . Obesity   . Paroxysmal atrial fibrillation (HCC)   . Peripheral neuropathy   . Polymyalgia rheumatica ([place])  maintained on Prednisone, Plaquenil. Followed by rhuematology every 4 months/[name].  . Shortness of breath dyspnea    with exertion  . Sleep apnea    CPAP   Trying to use    Past Surgical History:  Procedure Laterality Date  . APPENDECTOMY    . BIOPSY  01/23/2019   Procedure: BIOPSY;  Surgeon: Otis Brace, MD;  Location: [facility];  Service: Gastroenterology;;  . [place] 2000   left  . CATARACT EXTRACTION W/PHACO Right 11/28/2014   Procedure: CATARACT EXTRACTION PHACO AND INTRAOCULAR LENS PLACEMENT ([place]) RIGHT ;  Surgeon: Marylynn Pearson, MD;  Location: [place];  Service: Ophthalmology;   Laterality: Right;  . CORONARY ARTERY BYPASS GRAFT  03/17/1995   [facility]onia Lawman; followed every six months.  . ESOPHAGOGASTRODUODENOSCOPY (EGD) WITH PROPOFOL Left 01/23/2019   Procedure: ESOPHAGOGASTRODUODENOSCOPY (EGD) WITH PROPOFOL;  Surgeon: Otis Brace, MD;  Location: [facility];  Service: Gastroenterology;  Laterality: Left;  . HERNIA REPAIR    . LUMBAR LAMINECTOMY/DECOMPRESSION MICRODISCECTOMY N/A 07/30/2015   Procedure: LUMBAR LAMINECTOMY DISCECTOMY ;  Surgeon: Ashok Pall, MD;  Location: [place] [facility];  Service: Neurosurgery;  Laterality: N/A;  LUMBAR LAMINECTOMY DISCECTOMY   . LUMBAR LAMINECTOMY/DECOMPRESSION MICRODISCECTOMY N/A 09/06/2015   Procedure: Redo L3/4 Disectomy;  Surgeon: Ashok Pall, MD;  Location: [facility];  Service: Neurosurgery;  Laterality: N/A;  . PROSTATE SURGERY     TURP at [place].  Marland Kitchen TRANSURETHRAL RESECTION OF PROSTATE      Current Medications: Current Meds  Medication Sig  . amLODipine (NORVASC) 10 MG tablet TAKE 1 TABLET BY MOUTH EVERY DAY (Patient taking differently: Take 10 mg by mouth daily. )  . apixaban (ELIQUIS) 5 MG TABS tablet Take 5 mg by mouth 2 (two) times daily.  Marland Kitchen atorvastatin (LIPITOR) 20 MG tablet Take 20mg  (1 tab) 2 days a week (Patient taking differently: Take 20 mg by mouth See admin instructions. Take 20mg  (1 tab) 2 days a week on Sundays and Wednesdays)  . budesonide-formoterol (SYMBICORT) 160-4.5 MCG/ACT inhaler Inhale 2 puffs into the lungs 2 (two) times daily.  . calcitRIOL (ROCALTROL) 0.25 MCG capsule Take 0.25 mcg by mouth every Monday, Wednesday, and Friday.  . cyanocobalamin 1000 MCG tablet Take 1,000 mcg by mouth daily.  Marland Kitchen denosumab (PROLIA) 60 MG/ML SOSY injection Inject 60 mg into the skin every 6 (six) months.  . ferrous sulfate 325 (65 FE) MG tablet Take 325 mg by mouth as directed. MWF  . finasteride (PROSCAR) 5 MG tablet Take 5 mg by mouth daily.   . furosemide (LASIX) 40 MG tablet Take 1 tablet (40 mg total) by mouth 2 (two)  times daily.  . hydrALAZINE (APRESOLINE) 25 MG tablet Take 25 mg by mouth 2 (two) times daily.  . insulin lispro protamine-lispro (HUMALOG 75/25 MIX) (75-25) 100 UNIT/ML SUSP injection Inject into the skin 2 (two) times daily with a meal. See sliding scale instructions.  Marland Kitchen latanoprost (XALATAN) 0.005 % ophthalmic solution Place 1 drop into both eyes at bedtime.  Marland Kitchen loratadine (CLARITIN) 10 MG tablet Take 10 mg by mouth daily.  . metoprolol tartrate (LOPRESSOR) 100 MG tablet Take 100 mg by mouth 2 (two) times daily.  . montelukast (SINGULAIR) 10 MG tablet Take 1 tablet (10 mg total) by mouth at bedtime.  . Multiple Vitamins-Minerals (PRESERVISION AREDS 2 PO) Take 1 tablet by mouth 2 (two) times daily.   . nitroGLYCERIN (NITROSTAT) 0.4 MG SL tablet Place 0.4 mg under the tongue every 5 (five) minutes as needed for  chest pain.  . pantoprazole (PROTONIX) 40 MG tablet Take 1 tablet (40 mg total) by mouth daily.  . pilocarpine (PILOCAR) 4 % ophthalmic solution Place 1 drop into both eyes 2 (two) times daily.   . predniSONE (DELTASONE) 5 MG tablet Take 5 mg by mouth daily with breakfast.   . SEMAGLUTIDE,0.25 OR 0.5MG /DOS, [place] Inject 1.5 mLs into the skin once a week. 0.5mg /0.352ml  . Tamsulosin HCl (FLOMAX) 0.4 MG CAPS Take 0.8 mg by mouth at bedtime.   . [DISCONTINUED] methocarbamol (ROBAXIN) 500 MG tablet Take 1 tablet (500 mg total) by mouth every 8 (eight) hours as needed for muscle spasms.  . [DISCONTINUED] ondansetron (ZOFRAN ODT) 4 MG disintegrating tablet Take 1 tablet (4 mg total) by mouth every 6 (six) hours as needed.  . [DISCONTINUED] oxyCODONE-acetaminophen (PERCOCET/ROXICET) 5-325 MG tablet Take 2 tablets by mouth every 4 (four) hours as needed for severe pain.     Allergies:   Ace inhibitors, Actonel [risedronate], Ciprocinonide [fluocinolone], Flunisolide, Metformin and related, Sertraline, Sulindac, Terazosin, and Oxycodone   Social History   Socioeconomic History  . Marital status:  Married    Spouse name: Not on file  . Number of children: Not on file  . Years of education: Not on file  . Highest education level: Not on file  Occupational History  . Occupation: Geophysicist/field seismologist  Tobacco Use  . Smoking status: Former Smoker    Types: Cigarettes    Quit date: 03/16/1957    Years since quitting: 61.9  . Smokeless tobacco: Never Used  Substance and Sexual Activity  . Alcohol use: No  . Drug use: No  . Sexual activity: Not Currently    Birth control/protection: None  Other Topics Concern  . Not on file  Social History Narrative   Marital status: Married x 55 years.      Children: 3 children; 5 grandchildren; 6 gg.      Lives: with wife.        Employment: retired; [employer].  Working every other week; 45 hours.      Tobacco:  Quit at age 7.      Alcohol:  None      Exercise: none      ADLs: indepedent with ADLs;  Uses cane and has a walker.  Drives.      Advanced Directives:  +Living Will.  DNR/DNI.  Wife is HCPOA.   Right hand   One story   Social Determinants of Health   Financial Resource Strain:   . Difficulty of Paying Living Expenses: Not on file  Food Insecurity:   . Worried About Charity fundraiser in the Last Year: Not on file  . Ran Out of Food in the Last Year: Not on file  Transportation Needs:   . Lack of Transportation (Medical): Not on file  . Lack of Transportation (Non-Medical): Not on file  Physical Activity:   . Days of Exercise per Week: Not on file  . Minutes of Exercise per Session: Not on file  Stress:   . Feeling of Stress : Not on file  Social Connections:   . Frequency of Communication with Friends and Family: Not on file  . Frequency of Social Gatherings with Friends and Family: Not on file  . Attends Religious Services: Not on file  . Active Member of Clubs or Organizations: Not on file  . Attends Archivist Meetings: Not on file  . Marital Status: Not on file  Family History: The  patient's family history includes Diabetes in his brother; Hypertension in an other family member; Kidney failure in his brother. ROS:   Please see the history of present illness.    All other systems reviewed and are negative.  EKGs/Labs/Other Studies Reviewed:    The following studies were reviewed today:  EKG:  EKG ordered today and personally reviewed.  The ekg ordered today demonstrates sinus rhythm bifascicular heart block  Recent Labs: 08/23/2018: NT-Pro BNP 3,661; TSH 5.850 01/19/2019: B Natriuretic Peptide 267.3 01/20/2019: Magnesium 2.2 01/23/2019: ALT 13; BUN 19; Creatinine, Ser 2.51; Hemoglobin 10.7; Platelets 185; Potassium 4.8; Sodium 142  Recent Lipid Panel    Component Value Date/Time   CHOL 167 08/23/2018 1058   TRIG 104 08/23/2018 1058   HDL 52 08/23/2018 1058   CHOLHDL 3.2 08/23/2018 1058   CHOLHDL 4.1 01/24/2014 1425   VLDL 23 01/24/2014 1425   LDLCALC 94 08/23/2018 1058    Physical Exam:    VS:  BP 110/80 (BP Location: Right Arm, Patient Position: Sitting, Cuff Size: Normal)   Pulse (!) 59   Ht 5\' 11"  (1.803 m)   Wt 220 lb (99.8 kg)   SpO2 95%   BMI 30.68 kg/m     Wt Readings from Last 3 Encounters:  02/28/19 220 lb (99.8 kg)  01/19/19 210 lb 12.2 oz (95.6 kg)  01/03/19 228 lb 1.9 oz (103.5 kg)     GEN:  Well nourished, well developed in no acute distress HEENT: Normal NECK: No JVD; No carotid bruits LYMPHATICS: No lymphadenopathy CARDIAC: RRR, no murmurs, rubs, gallops RESPIRATORY:  Clear to auscultation without rales, wheezing or rhonchi  ABDOMEN: Soft, non-tender, non-distended MUSCULOSKELETAL:  No edema; No deformity  SKIN: Warm and dry NEUROLOGIC:  Alert and oriented x 3 PSYCHIATRIC:  Normal affect    Signed, Shirlee More, MD  02/28/2019 2:48 PM    [place]

## 2019-02-28 ENCOUNTER — Ambulatory Visit (INDEPENDENT_AMBULATORY_CARE_PROVIDER_SITE_OTHER): Payer: HMO | Admitting: Cardiology

## 2019-02-28 ENCOUNTER — Encounter: Payer: Self-pay | Admitting: Cardiology

## 2019-02-28 ENCOUNTER — Other Ambulatory Visit: Payer: Self-pay

## 2019-02-28 VITALS — BP 110/80 | HR 59 | Ht 71.0 in | Wt 220.0 lb

## 2019-02-28 DIAGNOSIS — Z7901 Long term (current) use of anticoagulants: Secondary | ICD-10-CM

## 2019-02-28 DIAGNOSIS — I110 Hypertensive heart disease with heart failure: Secondary | ICD-10-CM | POA: Diagnosis not present

## 2019-02-28 DIAGNOSIS — I5032 Chronic diastolic (congestive) heart failure: Secondary | ICD-10-CM | POA: Diagnosis not present

## 2019-02-28 DIAGNOSIS — I480 Paroxysmal atrial fibrillation: Secondary | ICD-10-CM

## 2019-02-28 DIAGNOSIS — N184 Chronic kidney disease, stage 4 (severe): Secondary | ICD-10-CM

## 2019-02-28 DIAGNOSIS — I25118 Atherosclerotic heart disease of native coronary artery with other forms of angina pectoris: Secondary | ICD-10-CM

## 2019-02-28 DIAGNOSIS — E785 Hyperlipidemia, unspecified: Secondary | ICD-10-CM

## 2019-02-28 NOTE — Patient Instructions (Signed)
Medication Instructions:  Your physician recommends that you continue on your current medications as directed. Please refer to the Current Medication list given to you today.  *If you need a refill on your cardiac medications before your next appointment, please call your pharmacy*  Lab Work: None  If you have labs (blood work) drawn today and your tests are completely normal, you will receive your results only by: Marland Kitchen MyChart Message (if you have MyChart) OR . A paper copy in the mail If you have any lab test that is abnormal or we need to change your treatment, we will call you to review the results.  Testing/Procedures: You had an EKG today.   Follow-Up: At [facility], you and your health needs are our priority.  As part of our continuing mission to provide you with exceptional heart care, we have created designated Provider Care Teams.  These Care Teams include your primary Cardiologist (physician) and Advanced Practice Providers (APPs -  Physician Assistants and Nurse Practitioners) who all work together to provide you with the care you need, when you need it.  Your next appointment:   3 month(s)  The format for your next appointment:   In Person  Provider:   Shirlee More, MD  Other Instructions **Check your blood pressure and heart rate daily at the same time every day and call our office if your heart rate is consistently irregular.

## 2019-03-02 ENCOUNTER — Ambulatory Visit: Payer: Self-pay

## 2019-03-03 NOTE — Telephone Encounter (Signed)
This encounter was created in error - please disregard.

## 2019-03-06 ENCOUNTER — Ambulatory Visit: Payer: PPO | Admitting: Cardiology

## 2019-03-09 ENCOUNTER — Other Ambulatory Visit: Payer: Self-pay

## 2019-03-09 NOTE — Patient Outreach (Signed)
  [facility]) Care Management Chronic Special Needs Program  03/09/2019  Name: Jacob [name] DOB: 1940/10/01  MRN: [id number]  [name] is enrolled in a chronic special needs plan for Heart Failure. Telephone call to client for follow up assessment. Unable to reach. HIPAA compliant voice message left with call back phone number and return call request.  Per review of medical records client is closed  to [facility] as of 02/01/19 at member request. RNCM will follow up with client and assist with care coordination needs per tier ( 3 months) level.    Goals Addressed            This Visit's Progress   . Client understands the importance of follow-up with providers by attending scheduled visits       Per chart review client attends appointments regularly.Medical record review indicated client has completed  at least 5 office visits with primary MD,  4 office visits with cardiologist, and  1 office visit with neurologist in 2020.      Marland Kitchen COMPLETED: General - Client will not be readmitted within 30 days (C-SNP)       No readmissions or ED visits post 30 days of discharge.     . Obtain annual  Lipid Profile, LDL-C       Per medical review Lipid profile completed on  08/24/18    . Visit Primary Care Provider or Cardiologist at least 2 times per year       Per chart review client attends appointments regularly.Medical record review indicated client has completed  at least 5 office visits with primary MD,  4 office visits with cardiologist, and  1 office visit with neurologist in 2020.         PLAN; RNCM will attempt 2nd telephone outreach to patient within 1 week.   Quinn Plowman RN,BSN,CCM [place] [facility] [phone]

## 2019-03-15 ENCOUNTER — Ambulatory Visit: Payer: Self-pay

## 2019-03-16 ENCOUNTER — Other Ambulatory Visit: Payer: Self-pay | Admitting: Family Medicine

## 2019-03-16 ENCOUNTER — Other Ambulatory Visit: Payer: Self-pay

## 2019-03-16 NOTE — Patient Outreach (Addendum)
[facility]) Care Management Chronic Special Needs Program  03/16/2019  Name: Jacob [name] DOB: May 02, 1940  MRN: [id number]  Mr. Kerman Pfost is enrolled in a chronic special needs plan for Diabetes. Chronic Care Management Coordinator telephoned client to review health risk assessment and to develop individualized care plan.  Introduced the chronic care management program, importance of client participation, and taking their care plan to all provider appointments and inpatient facilities.    Subjective: Telephone call to client.  Clients wife and designated party release answered call. Wife verified HIPAA for client. Wife confirms [facility] no longer providing services to client at her request. Wife states client is receiving home health nursing and physical therapy services from [facility] which was ordered by clients [place] doctor. Wife verbally agreed to ongoing follow up with CSNP RNCM.  Wife states client is independent with his ADL's but continues to need assistance with his IADL's.  She reports client has short term memory difficulty. Wife reports she manages clients medication and fills his pill box weekly. Wife reports client is taking approximately 20 medications.  Wife reports client also has macular degeneration.  Wife reports client continues to have some walking difficulty and instability but is doing much better since discharge from the hospital.   Wife states client continues to use his walker for ambulation and denies any recent falls.  Wife reports client has a follow up appointment with his [place] primary provider on tomorrow 03/17/2019.   Wife reports patient is weighing and recording weights daily for heart failure management. RNCM discussed heart failure symptoms and action plan/ zones. Wife verbalized understanding.  Wife states she and client know to call his primary MD with symptoms. RNCM discussed low salt/ heart healthy diet.  Wife states client is adhering to  his diet plan.      Goals Addressed            This Visit's Progress   . Client will have ADL/IADL needs addressed  within 3 weeks.       Client is receiving services with [facility] for nursing and physical therapy     . Client will report no worsening of symptoms of Atrial Fibrillation within the next within 3 months   On track    Client's spouse reports no worsening symptoms of atrial fibrillation    . Client will report no worsening of symptoms related to heart disease within the next within 3 months   On track   . Client will verbalize knowledge of chronic lung disease as evidenced by no ED visits or Inpatient stays related to chronic lung disease    On track   . Client will verbalize knowledge of diabetes self-management as evidenced by Hgb A1C <7 or as defined by provider.   On track    A1c 6.2 completed on 08/23/18  Diabetes self management actions:  Glucose monitoring per provider recommendations  Perform Quality checks on blood meter  Eat Healthy  Check feet daily  Visit provider every 3-6 months as directed  Hbg A1C level every 3-6 months.  Eye Exam yearly    . COMPLETED: Client/Caregiver will verbalize understanding of instructions related to self-care and safety   On track    Safety instructions discussed    . Maintain timely refills of Heart Failure medication as prescribed within the year    On track    Maintains timely refills.     . COMPLETED: Obtain annual  Lipid Profile, LDL-C  Per medical review Lipid profile completed on  08/24/18    . COMPLETED: Visit Primary Care Provider or Cardiologist at least 2 times per year       Per chart review client attends appointments regularly.Medical record review indicated client has completed  at least 5 office visits with primary MD,  4 office visits with cardiologist, and  1 office visit with neurologist in 2020.         Assessment: Client/ spouse has good understanding of:  COVID-19 cause,  symptoms, precautions (social distancing, stay at home order, hand washing, wear face covering when unable to maintain or ensure 6 foot social distancing), and symptoms requiring provider notification.   Plan:  Send successful outreach letter with a copy of their individualized care plan, Send individual care plan to provider   Chronic care management coordination will outreach in:  3 Months  Will refer client to:  [place] RN,BSN,CCM [place] Management [phone]

## 2019-03-21 ENCOUNTER — Telehealth: Payer: Self-pay | Admitting: Pharmacist

## 2019-03-21 NOTE — Patient Outreach (Signed)
[name]) Care Management  [name]   03/21/2019  Jacob [name] [birth year] [id number]  Reason for referral: Medication Management  Referral source: [employer] Care Manager with [facility] Current insurance: [employer]  PMHx includes but not limited to:  CHF, Adjustment Disorder, CAD, CKD, COPD, GERD, CHF,  OSA, A fib, Rheumatoid Arthritis, and type 2 diabetes.  Outreach:  Successful telephone call with patient and his wife.  HIPAA identifiers verified.   Subjective:  Patient is a 79 year old male with the medical conditions listed above. HIPAA identifiers were obtained.   Patient gets all of his medications from the [place] and did not report any issues or questions.   Does the patient ever forget to take medication?  no Does the patient have problems obtaining medications due to transportation?   no Does the patient have problems obtaining medications due to cost?  no  Does the patient feel that medications prescribed are effective?  yes Does the patient ever experience any side effects to the medications prescribed?  no  Does the patient measure his/her own blood glucose at home?  Yes Does the patient measure his/her own blood pressure at home? No   Objective: The 10-year ASCVD risk score Jacob Bussing DC Jr., et al., 2013) is: 45.7%   Values used to calculate the score:     Age: 50 years     Sex: Male     Is Non-Hispanic African American: No     Diabetic: Yes     Tobacco smoker: No     Systolic Blood Pressure: 629 mmHg     Is BP treated: Yes     HDL Cholesterol: 52 mg/dL     Total Cholesterol: 167 mg/dL  Lab Results  Component Value Date   CREATININE 2.51 (H) 01/23/2019   CREATININE 2.67 (H) 01/21/2019   CREATININE 3.10 (H) 01/20/2019    Lab Results  Component Value Date   HGBA1C 6.2 11/07/2018    Lipid Panel     Component Value Date/Time   CHOL 167 08/23/2018 1058   TRIG 104 08/23/2018 1058   HDL 52  08/23/2018 1058   CHOLHDL 3.2 08/23/2018 1058   CHOLHDL 4.1 01/24/2014 1425   VLDL 23 01/24/2014 1425   LDLCALC 94 08/23/2018 1058    BP Readings from Last 3 Encounters:  02/28/19 110/80  01/24/19 (!) 121/50  01/08/19 (!) 184/99    Allergies  Allergen Reactions  . Ace Inhibitors Other (See Comments)    Probably nausea and vomiting per patient   . Actonel [Risedronate] Nausea And Vomiting  . Ciprocinonide [Fluocinolone] Other (See Comments)    Probably nausea and vomiting per patient  . Flunisolide Other (See Comments)    Probably nausea and vomiting per patient   . Metformin And Related Other (See Comments)    Probably nausea and vomiting per patient   . Sertraline Other (See Comments)    Probably nausea and vomiting per patient   . Sulindac Other (See Comments)    Probably nausea and vomiting per patient   . Terazosin Other (See Comments)    Probably nausea and vomiting per patient   . Oxycodone Nausea And Vomiting    Medications Reviewed Today    Reviewed by Elayne Guerin, [facility] (Pharmacist) on 03/21/19 at [phone]  Med List Status: <None>  Medication Order Taking? Sig Documenting Provider Last Dose Status Informant  amLODipine (NORVASC) 10 MG tablet [id number] Yes TAKE 1 TABLET BY MOUTH  EVERY DAY  Patient taking differently: Take 10 mg by mouth daily.    Richardo Priest, MD Taking Active Spouse/Significant Other  apixaban (ELIQUIS) 5 MG TABS tablet [id number] Yes Take 5 mg by mouth 2 (two) times daily. [provider] Taking Active Spouse/Significant Other  atorvastatin (LIPITOR) 20 MG tablet [id number] Yes Take 20mg  (1 tab) 2 days a week  Patient taking differently: Take 20 mg by mouth See admin instructions. Take 20mg  (1 tab) 2 days a week on Sundays and Wednesdays   Richardo Priest, MD Taking Active Spouse/Significant Other  budesonide-formoterol [facility]) 160-4.5 MCG/ACT inhaler [id number] No Inhale 2 puffs into the lungs 2 (two) times daily. [provider] Not Taking Active Spouse/Significant Other  calcitRIOL (ROCALTROL) 0.25 MCG capsule [id number] Yes Take 0.25 mcg by mouth every Monday, Wednesday, and Friday. [provider] Taking Active Spouse/Significant Other  cyanocobalamin 1000 MCG tablet [id number] Yes Take 1,000 mcg by mouth daily. [provider] Taking Active Spouse/Significant Other  denosumab (PROLIA) 60 MG/ML SOSY injection [id number] Yes Inject 60 mg into the skin every 6 (six) months. [provider] Taking Active Spouse/Significant Other  ferrous sulfate 325 (65 FE) MG tablet [id number] Yes Take 325 mg by mouth as directed. MWF [provider] Taking Active Spouse/Significant Other  finasteride (PROSCAR) 5 MG tablet [id number] Yes Take 5 mg by mouth daily.  [provider] Taking Active Spouse/Significant Other  furosemide (LASIX) 40 MG tablet [id number] Yes TAKE 1 TABLET BY MOUTH TWICE A DAY Gifford Shave, MD Taking Active   hydrALAZINE (APRESOLINE) 25 MG tablet [id number] Yes Take 25 mg by mouth 2 (two) times daily. [provider] Taking Active Spouse/Significant Other  insulin lispro protamine-lispro (HUMALOG 75/25 MIX) (75-25) 100 UNIT/ML SUSP injection [id number] Yes Inject into the skin 2 (two) times daily with a meal. See sliding scale instructions. [provider] Taking Active Spouse/Significant Other  latanoprost (XALATAN) 0.005 % ophthalmic solution [id number] Yes Place 1 drop into both eyes at bedtime. [provider] Taking Active Spouse/Significant Other  metoprolol tartrate (LOPRESSOR) 100 MG tablet [id number] Yes Take 100 mg by mouth 2 (two) times daily. [provider] Taking Active Spouse/Significant Other  Multiple Vitamins-Minerals (PRESERVISION AREDS 2 PO) [id number]  Take 1 tablet by mouth 2 (two) times daily.  [provider]  Active Spouse/Significant Other  nitroGLYCERIN (NITROSTAT) 0.4 MG SL tablet [id number] Yes  Place 0.4 mg under the tongue every 5 (five) minutes as needed for chest pain. [provider] Taking Active Spouse/Significant Other           Med Note Kenton Kingfisher, ABBIE R   Tue Feb 28, 2019  2:01 PM)    pilocarpine (PILOCAR) 4 % ophthalmic solution [id number] Yes Place 1 drop into both eyes 2 (two) times daily.  [provider] Taking Active Spouse/Significant Other  predniSONE (DELTASONE) 5 MG tablet [id number] Yes Take 5 mg by mouth daily with breakfast.  [provider] Taking Active Spouse/Significant Other           Med Note Eulas Post, CRYSTAL D   Thu Jan 19, 2019  9:25 PM) Continuous   SEMAGLUTIDE,0.25 OR 0.5MG /DOS, Earl Gala [id number] Yes Inject 1.5 mLs into the skin once a week. 0.5mg /0.394ml [provider] Taking Active Spouse/Significant Other           Med Note ([place], [place] Nov 01, 2018  9:06 PM) Take on Mondays   Tamsulosin HCl (FLOMAX) 0.4 MG CAPS [id number] Yes Take 0.8 mg by  mouth at bedtime.  [provider] Taking Active Spouse/Significant Other  Med List Note Dessie Coma, CPhT 07/25/15 1319): [facility] in both [place] and [place] ([place])            Assessment: Drugs sorted by system:  Cardiovascular:  Amlodipine, Atorvastatin, Furosemide, Hydralazine, Metoprolol Tartrate, Nitroglycerin  Pulmonary/Allergy: Symbicort  Endocrine: Prolia, Humalog 75/25, Ozempic  Pain: Prednisone  Genitourinary: Finasteride, Tamsulosin  Vitamins/Minerals/Supplements: Calcitriol, Cyanocobalamin, Ferrous Sulfate, Multiple Vitamin     Miscellaneous: Xalatan, Latanoprost, Pilocarpine  Medication Review Findings:  Patient reported his HgA1c was 6.3%. He checked his blood sugar this morning and reported it was 165 mg/dl fasting.  Medication Adherence Findings: Adherence Review  [x]  Excellent (no doses missed/week)     []  Good (no more than 1 dose missed/week)     []  Partial (2-3 doses missed/week) []  Poor (>3 doses  missed/week)  Patient with good understanding of regimen and good understanding of indications.    Medication Assistance Findings:  No medication assistance needs identified  Extra Help:  Not eligible for Extra Help Low Income Subsidy based on reported income and assets  Plan: . Will follow-up in 3-4 months.Elayne Guerin, PharmD, [place] Clinical Pharmacist [phone]

## 2019-04-04 DIAGNOSIS — D131 Benign neoplasm of stomach: Secondary | ICD-10-CM | POA: Diagnosis not present

## 2019-05-11 ENCOUNTER — Other Ambulatory Visit: Payer: Self-pay | Admitting: Cardiology

## 2019-05-22 NOTE — Progress Notes (Signed)
Cardiology Office Note:    Date:  05/23/2019   ID:  Jacob [name], DOB 1940-08-04, MRN [id number]  PCP:  Gifford Shave, MD  Cardiologist:  Shirlee More, MD    Referring MD: Gifford Shave, MD    ASSESSMENT:    1. Paroxysmal atrial fibrillation (HCC)   2. Chronic anticoagulation   3. Hypertensive heart disease with chronic diastolic congestive heart failure (HCC)   4. Stage 4 chronic kidney disease ([place])   5. Right bundle branch block (RBBB) with left anterior hemiblock    PLAN:    In order of problems listed above:  1. Stable in sinus rhythm off amiodarone continue anticoagulant I would not place him on antiarrhythmic drug at this time 2. Failure compensated continue current antihypertensives and diuretic.  BNP level in November was reassuring and at target level 3. Stable CKD followed at the [facility] hospital 4. His EKG today shows he maintained sinus rhythm but has bifascicular heart block and very mild prolongation of his AV interval we will applied 3-day ZIO monitor to screen him for bradycardia as he is at increased risk of complete heart block even if asymptomatic.  If he has evidence of higher degree heart block would benefit from EP consultation and pacemaker prophylactically.   Next appointment: 6 months   Medication Adjustments/Labs and Tests Ordered: Current medicines are reviewed at length with the patient today.  Concerns regarding medicines are outlined above.  No orders of the defined types were placed in this encounter.  No orders of the defined types were placed in this encounter.   Chief Complaint  Patient presents with  . Follow-up    Amiodarone was discontinued  . Atrial Fibrillation    History of Present Illness:    Jacob [name] is a 79 y.o. male with a hx of CAD s/p CABGx7 1997, DM2, HTN, HF, PAF on amiodarone & chronic anticoagulation, CKD, COPD, and OSA .  There was a call to our office 12/29/2018 the patient's wife related that his  pulmonologist recommended discontinuing amiodarone and  he was instructed and stopped same day.   he was last seen 02/28/2019 maintaining sinus rhythm off amiodarone. Compliance with diet, lifestyle and medications: Yes  He is doing better his shortness of breath is improved off amiodarone no cough is followed by pulmonary at the [facility] hospital and he is off his inhaled corticosteroid.  Inquires about endoscopy I do not see a problem with that and I told him typically to skip his anticoagulant for 2 days before and 1 to 2 days afterwards determined by the gastroenterologist.  He has had no palpitations wife follows heart rate and blood pressure at home and no abnormality.  No edema chest pain and has mild exertional shortness of breath without orthopnea.  His wife said recent labs from the [place] hospital shows stable stage IV CKD Past Medical History:  Diagnosis Date  . Allergy   . Anemia   . Anxiety   . Arthritis   . Asthma   . BPH (benign prostatic hyperplasia)   . Cancer of kidney ([place])   . Cataract   . CHF (congestive heart failure) ([place])   . Chronic kidney disease    chronic  kidney failure  kidney function at 42%  . COPD (chronic obstructive pulmonary disease) ([place])   . Coronary artery disease    CABG  7 bypasses  . Diabetes mellitus without complication ([place])   . Facial cellulitis   . GERD (gastroesophageal reflux disease)   .  Gout   . Hepatitis    many years ago  . Hyperlipidemia   . Hypertension   . Lumbar disc disease   . Obesity   . Paroxysmal atrial fibrillation (HCC)   . Peripheral neuropathy   . Polymyalgia rheumatica (HCC)    maintained on Prednisone, Plaquenil. Followed by rhuematology every 4 months/[name].  . Shortness of breath dyspnea    with exertion  . Sleep apnea    CPAP   Trying to use    Past Surgical History:  Procedure Laterality Date  . APPENDECTOMY    . BIOPSY  01/23/2019   Procedure: BIOPSY;  Surgeon: Otis Brace, MD;  Location: [facility];   Service: Gastroenterology;;  . [place] 2000   left  . CATARACT EXTRACTION W/PHACO Right 11/28/2014   Procedure: CATARACT EXTRACTION PHACO AND INTRAOCULAR LENS PLACEMENT ([place]) RIGHT ;  Surgeon: Marylynn Pearson, MD;  Location: [facility];  Service: Ophthalmology;  Laterality: Right;  . CORONARY ARTERY BYPASS GRAFT  03/17/1995   [place]onia Lawman; followed every six months.  . ESOPHAGOGASTRODUODENOSCOPY (EGD) WITH PROPOFOL Left 01/23/2019   Procedure: ESOPHAGOGASTRODUODENOSCOPY (EGD) WITH PROPOFOL;  Surgeon: Otis Brace, MD;  Location: [facility];  Service: Gastroenterology;  Laterality: Left;  . HERNIA REPAIR    . LUMBAR LAMINECTOMY/DECOMPRESSION MICRODISCECTOMY N/A 07/30/2015   Procedure: LUMBAR LAMINECTOMY DISCECTOMY ;  Surgeon: Ashok Pall, MD;  Location: [place] [facility];  Service: Neurosurgery;  Laterality: N/A;  LUMBAR LAMINECTOMY DISCECTOMY   . LUMBAR LAMINECTOMY/DECOMPRESSION MICRODISCECTOMY N/A 09/06/2015   Procedure: Redo L3/4 Disectomy;  Surgeon: Ashok Pall, MD;  Location: [place] [facility];  Service: Neurosurgery;  Laterality: N/A;  . PROSTATE SURGERY     TURP at [place].  Marland Kitchen TRANSURETHRAL RESECTION OF PROSTATE      Current Medications: Current Meds  Medication Sig  . amLODipine (NORVASC) 10 MG tablet Take 1 tablet (10 mg total) by mouth daily.  Marland Kitchen apixaban (ELIQUIS) 5 MG TABS tablet Take 5 mg by mouth 2 (two) times daily.  Marland Kitchen atorvastatin (LIPITOR) 20 MG tablet Take 20mg  (1 tab) 2 days a week (Patient taking differently: Take 20 mg by mouth See admin instructions. Take 20mg  (1 tab) 2 days a week on Sundays and Wednesdays)  . calcitRIOL (ROCALTROL) 0.25 MCG capsule Take 0.25 mcg by mouth every Monday, Wednesday, and Friday.  . cyanocobalamin 1000 MCG tablet Take 1,000 mcg by mouth daily.  Marland Kitchen denosumab (PROLIA) 60 MG/ML SOSY injection Inject 60 mg into the skin every 6 (six) months.  . ferrous sulfate 325 (65 FE) MG tablet Take 325 mg by mouth as directed. MWF  . finasteride (PROSCAR) 5  MG tablet Take 5 mg by mouth daily.   . furosemide (LASIX) 40 MG tablet TAKE 1 TABLET BY MOUTH TWICE A DAY  . hydrALAZINE (APRESOLINE) 25 MG tablet Take 25 mg by mouth 2 (two) times daily.  . insulin lispro protamine-lispro (HUMALOG 75/25 MIX) (75-25) 100 UNIT/ML SUSP injection Inject into the skin 2 (two) times daily with a meal. See sliding scale instructions.  Marland Kitchen latanoprost (XALATAN) 0.005 % ophthalmic solution Place 1 drop into both eyes at bedtime.  . metoprolol tartrate (LOPRESSOR) 100 MG tablet Take 100 mg by mouth 2 (two) times daily.  . Multiple Vitamins-Minerals (PRESERVISION AREDS 2 PO) Take 1 tablet by mouth 2 (two) times daily.   . nitroGLYCERIN (NITROSTAT) 0.4 MG SL tablet Place 0.4 mg under the tongue every 5 (five) minutes as needed for chest pain.  . pilocarpine (PILOCAR) 4 % ophthalmic solution  Place 1 drop into both eyes 2 (two) times daily.   . predniSONE (DELTASONE) 5 MG tablet Take 5 mg by mouth daily with breakfast.   . SEMAGLUTIDE,0.25 OR 0.5MG /DOS, [place] Inject 1.5 mLs into the skin once a week. 0.5mg /0.342ml  . Tamsulosin HCl (FLOMAX) 0.4 MG CAPS Take 0.8 mg by mouth at bedtime.      Allergies:   Ace inhibitors, Actonel [risedronate], Ciprocinonide [fluocinolone], Flunisolide, Metformin and related, Sertraline, Sulindac, Terazosin, and Oxycodone   Social History   Socioeconomic History  . Marital status: Married    Spouse name: Not on file  . Number of children: Not on file  . Years of education: Not on file  . Highest education level: Not on file  Occupational History  . Occupation: Geophysicist/field seismologist  Tobacco Use  . Smoking status: Former Smoker    Types: Cigarettes    Quit date: 03/16/1957    Years since quitting: 62.2  . Smokeless tobacco: Never Used  Substance and Sexual Activity  . Alcohol use: No  . Drug use: No  . Sexual activity: Not Currently    Birth control/protection: None  Other Topics Concern  . Not on file  Social History Narrative   Marital status:  Married x 55 years.      Children: 3 children; 5 grandchildren; 6 gg.      Lives: with wife.        Employment: retired; [employer].  Working every other week; 45 hours.      Tobacco:  Quit at age 56.      Alcohol:  None      Exercise: none      ADLs: indepedent with ADLs;  Uses cane and has a walker.  Drives.      Advanced Directives:  +Living Will.  DNR/DNI.  Wife is HCPOA.   Right hand   One story   Social Determinants of Health   Financial Resource Strain:   . Difficulty of Paying Living Expenses: Not on file  Food Insecurity: No Food Insecurity  . Worried About Charity fundraiser in the Last Year: Never true  . Ran Out of Food in the Last Year: Never true  Transportation Needs: No Transportation Needs  . Lack of Transportation (Medical): No  . Lack of Transportation (Non-Medical): No  Physical Activity:   . Days of Exercise per Week: Not on file  . Minutes of Exercise per Session: Not on file  Stress:   . Feeling of Stress : Not on file  Social Connections:   . Frequency of Communication with Friends and Family: Not on file  . Frequency of Social Gatherings with Friends and Family: Not on file  . Attends Religious Services: Not on file  . Active Member of Clubs or Organizations: Not on file  . Attends Archivist Meetings: Not on file  . Marital Status: Not on file     Family History: The patient's family history includes Diabetes in his brother; Hypertension in an other family member; Kidney failure in his brother. ROS:   Please see the history of present illness.    All other systems reviewed and are negative.  EKGs/Labs/Other Studies Reviewed:    The following studies were reviewed today:  EKG:  EKG ordered today and personally reviewed.  The ekg ordered today demonstrates sinus rhythm first-degree AV block right bundle branch block left anterior hemiblock PR interval mildly increased 212 ms  Recent Labs: 08/23/2018: NT-Pro BNP  3,661; TSH 5.850  01/19/2019: B Natriuretic Peptide 267.3 01/20/2019: Magnesium 2.2 01/23/2019: ALT 13; BUN 19; Creatinine, Ser 2.51; Hemoglobin 10.7; Platelets 185; Potassium 4.8; Sodium 142  Recent Lipid Panel    Component Value Date/Time   CHOL 167 08/23/2018 1058   TRIG 104 08/23/2018 1058   HDL 52 08/23/2018 1058   CHOLHDL 3.2 08/23/2018 1058   CHOLHDL 4.1 01/24/2014 1425   VLDL 23 01/24/2014 1425   LDLCALC 94 08/23/2018 1058    Physical Exam:    VS:  BP 120/62   Pulse 68   Temp (!) 97 F (36.1 C)   Ht 5\' 11"  (1.803 m)   Wt 226 lb (102.5 kg)   SpO2 98%   BMI 31.52 kg/m     Wt Readings from Last 3 Encounters:  05/23/19 226 lb (102.5 kg)  02/28/19 220 lb (99.8 kg)  01/19/19 210 lb 12.2 oz (95.6 kg)     GEN: Looks improved no longer has a gray appearance well nourished, well developed in no acute distress HEENT: Normal NECK: No JVD; No carotid bruits LYMPHATICS: No lymphadenopathy CARDIAC: RRR, no murmurs, rubs, gallops RESPIRATORY:  Clear to auscultation without rales, wheezing or rhonchi  ABDOMEN: Soft, non-tender, non-distended MUSCULOSKELETAL:  No edema; No deformity  SKIN: Warm and dry NEUROLOGIC:  Alert and oriented x 3 PSYCHIATRIC:  Normal affect    Signed, Shirlee More, MD  05/23/2019 1:52 PM    [facility]

## 2019-05-23 ENCOUNTER — Ambulatory Visit (INDEPENDENT_AMBULATORY_CARE_PROVIDER_SITE_OTHER): Payer: HMO

## 2019-05-23 ENCOUNTER — Other Ambulatory Visit: Payer: Self-pay

## 2019-05-23 ENCOUNTER — Ambulatory Visit (INDEPENDENT_AMBULATORY_CARE_PROVIDER_SITE_OTHER): Payer: HMO | Admitting: Cardiology

## 2019-05-23 ENCOUNTER — Encounter: Payer: Self-pay | Admitting: Cardiology

## 2019-05-23 VITALS — BP 120/62 | HR 68 | Temp 97.0°F | Ht 71.0 in | Wt 226.0 lb

## 2019-05-23 DIAGNOSIS — I110 Hypertensive heart disease with heart failure: Secondary | ICD-10-CM

## 2019-05-23 DIAGNOSIS — I452 Bifascicular block: Secondary | ICD-10-CM

## 2019-05-23 DIAGNOSIS — Z7901 Long term (current) use of anticoagulants: Secondary | ICD-10-CM | POA: Diagnosis not present

## 2019-05-23 DIAGNOSIS — N184 Chronic kidney disease, stage 4 (severe): Secondary | ICD-10-CM | POA: Diagnosis not present

## 2019-05-23 DIAGNOSIS — I480 Paroxysmal atrial fibrillation: Secondary | ICD-10-CM

## 2019-05-23 DIAGNOSIS — I5032 Chronic diastolic (congestive) heart failure: Secondary | ICD-10-CM

## 2019-05-23 NOTE — Patient Instructions (Signed)
Medication Instructions:  Your physician recommends that you continue on your current medications as directed. Please refer to the Current Medication list given to you today. Your physician recommends that you continue on your current medications as directed. Please refer to the Current Medication list given to you today.  *If you need a refill on your cardiac medications before your next appointment, please call your pharmacy*   Lab Work: NONE If you have labs (blood work) drawn today and your tests are completely normal, you will receive your results only by: Marland Kitchen MyChart Message (if you have MyChart) OR . A paper copy in the mail If you have any lab test that is abnormal or we need to change your treatment, we will call you to review the results.   Testing/Procedures: EKG, Your physician has recommended that you wear a holter monitor. Holter monitors are medical devices that record the heart's electrical activity. Doctors most often use these monitors to diagnose arrhythmias. Arrhythmias are problems with the speed or rhythm of the heartbeat. The monitor is a small, portable device. You can wear one while you do your normal daily activities. This is usually used to diagnose what is causing palpitations/syncope (passing out).Will wear the zio monitor for 3 days    Follow-Up: At [facility], you and your health needs are our priority.  As part of our continuing mission to provide you with exceptional heart care, we have created designated Provider Care Teams.  These Care Teams include your primary Cardiologist (physician) and Advanced Practice Providers (APPs -  Physician Assistants and Nurse Practitioners) who all work together to provide you with the care you need, when you need it.  We recommend signing up for the patient portal called "MyChart".  Sign up information is provided on this After Visit Summary.  MyChart is used to connect with patients for Virtual Visits (Telemedicine).   Patients are able to view lab/test results, encounter notes, upcoming appointments, etc.  Non-urgent messages can be sent to your provider as well.   To learn more about what you can do with MyChart, go to [email or web address].    Your next appointment:   6 month(s)  The format for your next appointment:   In Person  Provider:   Shirlee More, MD   Other Instructions

## 2019-05-30 ENCOUNTER — Other Ambulatory Visit: Payer: Self-pay

## 2019-05-30 NOTE — Patient Outreach (Signed)
[facility]) Care Management Chronic Special Needs Program  05/30/2019  Name: Jacob [name] DOB: 1941-01-14  MRN: [id number]  Mr. Eulice Rutledge is enrolled in a chronic special needs plan for Heart Failure. Reviewed and updated care plan.  Call to client for assessment follow up. Client's wife/ designated provider, Janice Norrie answered call.  Wife states client is doing good. She reports he still has some back and leg discomfort which has been normal for him. Wife states client is no longer receiving services with [facility]. She reports the physical therapist gave the client home exercises which he does everyday. Wife state client saw his cardiologist on 05/23/19. She states client is not voicing any heart failure symptoms. She reports he continues to weigh daily and reports clients current weight is 220 lbs.  She denies any medication changes at this visit.  Wife states the cardiologist ordered client an EKG and 3 day Holter monitor. Wife reports the results of both were good and client is not having any problems.  Wife states client has not seen his primary provider Dr. Caron Presume in quite a while. She reports client saw his [place] primary care provider on 05/09/19. She states some of client's breathing medications were discontinued. Wife states clients A1c has been great at 6.2 and his blood sugar this morning was 91.  Wife states client has a follow up visit with his cardiologist in September 2021 and a follow up with the [place] provider in 6 months.   Goals Addressed            This Visit's Progress   . COMPLETED: Client understands the importance of follow-up with providers by attending scheduled visits       Clients wife states client had follow up visit with [facility] primary care provider on 05/09/19 and follow up with cardiologist on 05/23/19 Client's and/ or caregiver verbalizes understanding of follow up visits with providers.     . Client understands the importance of  follow-up with providers by attending scheduled visits      . COMPLETED: Client will have ADL/IADL needs addressed  within 3 weeks.       Wife states client is is independent with his activities of daily living. Wife reports [facility] closed client to service on 05/23/19.      Marland Kitchen COMPLETED: Client will report no worsening of symptoms of Atrial Fibrillation within the next within 3 months       Wife reports client has not had any symptoms related to Atrial fibrillation. Wife states client had follow up visit with cardiologist on 05/23/19.    Marland Kitchen COMPLETED: Client will report no worsening of symptoms related to heart disease within the next within 3 months       Client reported no worsening symptoms related to heart disease.     . COMPLETED: Client will verbalize knowledge of chronic lung disease as evidenced by no ED visits or Inpatient stays related to chronic lung disease        Client / caregiver report no ED visits or hospitalization related to chronic lung disease.     . Client will verbalize knowledge of diabetes self-management as evidenced by Hgb A1C <7 or as defined by provider.       A1c 6.2 completed on 11/07/18 Continue diabetes self management actions:  Glucose monitoring per provider recommendations  Perform Quality checks on blood meter  Eat Healthy  Visit provider every 3-6 months as directed  Hbg A1C level  every 3-6 months.     . Client will verbalize knowledge of self management of Hypertension as evidences by BP reading of 140/90 or less; or as defined by provider       Wife states client is monitoring his blood pressure at home.  Wife reports client's most recent blood pressure was 120/60 Mailed client education article:  Diabetes and blood pressure Continue to take your medications as prescribed. Follow up with your provider as recommended.  Continue to monitor your blood pressure and take results to your doctor appointments.     . Decrease inpatient Heart  Failure admissions/ readmissions with in the year      . Decrease the use of hospital emergency department related to heart failure within the next year      . Maintain timely refills of Heart Failure medication as prescribed within the year        Client's wife reports client is taking his medications as prescribed.  Review of medical record indicates client maintains timely refills of his heart failure medication Follow up with your doctor if you have questions Contact your assigned RN case manager if you have difficulty obtaining your medications    . Obtain annual  Lipid Profile, LDL-C       The goal for LDL is less than 70 mg / dl as you are at high risk for complications try to avoid saturated fats, trans-fats, and eat more fiber RN case manager will send client education article: Heart healthy diet.     . Visit Primary Care Provider or Cardiologist at least 2 times per year       Primary care provider visit completed 05/09/19 Cardiology visit completed 05/23/19 Continue to see your provider and cardiologist at least 2 times per year       ASSESSMENT: Clients medications review.  Client continues to meet diabetes self-management goal of hemoglobin A1C of <7.0% with most recent reading of 6.2% on 11/07/18 without reports of hypoglycemia . Client continues to have good understanding of:  COVID-19 cause, symptoms, precautions (social distancing, stay at home order, hand washing, wear face covering when unable to maintain or ensure 6 foot social distancing), and symptoms requiring provider notification.  Plan:  Send successful outreach letter with a copy of their individualized care plan, Send individual care plan to provider and Send educational material  Chronic care management coordinator will outreach in:  6 Months     Quinn Plowman RN,BSN,CCM [place] [facility] [phone]    .

## 2019-06-15 ENCOUNTER — Other Ambulatory Visit: Payer: Self-pay | Admitting: Pharmacist

## 2019-06-15 NOTE — Patient Outreach (Addendum)
[facility])  [place] Team    [facility] pharmacy case will be closed as our team is transitioning from the [place] Management Department into the [facility] Quality Department and will no longer be using [facility] for documentation purposes.     Patient has had a medication review by a Pharmacist and did not have any medication related questions or concerns.    Elayne Guerin, PharmD, [place] Clinical Pharmacist [phone]

## 2019-06-19 ENCOUNTER — Ambulatory Visit: Payer: Self-pay | Admitting: Pharmacist

## 2019-08-01 ENCOUNTER — Ambulatory Visit: Payer: HMO

## 2019-08-15 ENCOUNTER — Ambulatory Visit: Payer: Self-pay

## 2019-09-13 ENCOUNTER — Other Ambulatory Visit: Payer: Self-pay | Admitting: Family Medicine

## 2019-10-19 ENCOUNTER — Other Ambulatory Visit: Payer: Self-pay

## 2019-10-19 ENCOUNTER — Ambulatory Visit: Payer: No Typology Code available for payment source | Attending: Internal Medicine | Admitting: Physical Therapy

## 2019-10-19 DIAGNOSIS — R2681 Unsteadiness on feet: Secondary | ICD-10-CM | POA: Diagnosis present

## 2019-10-19 DIAGNOSIS — M6281 Muscle weakness (generalized): Secondary | ICD-10-CM | POA: Insufficient documentation

## 2019-10-19 DIAGNOSIS — M545 Low back pain, unspecified: Secondary | ICD-10-CM

## 2019-10-19 DIAGNOSIS — R2689 Other abnormalities of gait and mobility: Secondary | ICD-10-CM | POA: Diagnosis present

## 2019-10-19 DIAGNOSIS — G8929 Other chronic pain: Secondary | ICD-10-CM | POA: Diagnosis present

## 2019-10-19 NOTE — Therapy (Signed)
[facility] [address] [place], [place], [address] Phone: [phone]   Fax:  [phone]  Physical Therapy Evaluation  Patient Details  Name: Jacob [name] MRN: [id number] Date of Birth: 1941/02/27 Referring Provider (PT): Dr. Elberta Spaniel    Encounter Date: 10/19/2019   PT End of Session - 10/19/19 1419    Visit Number 1    Date for PT Re-Evaluation 01/09/20    Authorization Type VA 15 visits 09/08/19-01/09/20    PT Start Time 1230    PT Stop Time 1315    PT Time Calculation (min) 45 min    Activity Tolerance Patient limited by fatigue           Past Medical History:  Diagnosis Date  . Allergy   . Anemia   . Anxiety   . Arthritis   . Asthma   . BPH (benign prostatic hyperplasia)   . Cancer of kidney ([place])   . Cataract   . CHF (congestive heart failure) ([place])   . Chronic kidney disease    chronic  kidney failure  kidney function at 42%  . COPD (chronic obstructive pulmonary disease) ([place])   . Coronary artery disease    CABG  7 bypasses  . Diabetes mellitus without complication ([place])   . Facial cellulitis   . GERD (gastroesophageal reflux disease)   . Gout   . Hepatitis    many years ago  . Hyperlipidemia   . Hypertension   . Lumbar disc disease   . Obesity   . Paroxysmal atrial fibrillation (HCC)   . Peripheral neuropathy   . Polymyalgia rheumatica (HCC)    maintained on Prednisone, Plaquenil. Followed by rhuematology every 4 months/[name].  . Shortness of breath dyspnea    with exertion  . Sleep apnea    CPAP   Trying to use    Past Surgical History:  Procedure Laterality Date  . APPENDECTOMY    . BIOPSY  01/23/2019   Procedure: BIOPSY;  Surgeon: Otis Brace, MD;  Location: [facility];  Service: Gastroenterology;;  . [place] 2000   left  . CATARACT EXTRACTION W/PHACO Right 11/28/2014   Procedure: CATARACT EXTRACTION PHACO AND INTRAOCULAR LENS PLACEMENT ([place])  RIGHT ;  Surgeon: Marylynn Pearson, MD;  Location: [facility];  Service: Ophthalmology;  Laterality: Right;  . CORONARY ARTERY BYPASS GRAFT  03/17/1995   [facility]onia Lawman; followed every six months.  . ESOPHAGOGASTRODUODENOSCOPY (EGD) WITH PROPOFOL Left 01/23/2019   Procedure: ESOPHAGOGASTRODUODENOSCOPY (EGD) WITH PROPOFOL;  Surgeon: Otis Brace, MD;  Location: [facility];  Service: Gastroenterology;  Laterality: Left;  . HERNIA REPAIR    . LUMBAR LAMINECTOMY/DECOMPRESSION MICRODISCECTOMY N/A 07/30/2015   Procedure: LUMBAR LAMINECTOMY DISCECTOMY ;  Surgeon: Ashok Pall, MD;  Location: [facility];  Service: Neurosurgery;  Laterality: N/A;  LUMBAR LAMINECTOMY DISCECTOMY   . LUMBAR LAMINECTOMY/DECOMPRESSION MICRODISCECTOMY N/A 09/06/2015   Procedure: Redo L3/4 Disectomy;  Surgeon: Ashok Pall, MD;  Location: [facility];  Service: Neurosurgery;  Laterality: N/A;  . PROSTATE SURGERY     TURP at [place].  Marland Kitchen TRANSURETHRAL RESECTION OF PROSTATE      There were no vitals filed for this visit.    Subjective Assessment - 10/19/19 1237    Subjective Complains of frequent falls legs give way and I fall.   "I'm going downhill."   Walks with RW forward bent.    Pertinent History had HHPT 2 months ago after hospitalized 5 days related to kidney  Limitations Walking;Standing;House hold activities    How long can you sit comfortably? 2 hours    How long can you stand comfortably? sits in chair in shower with wife's assist    How long can you walk comfortably? no where comfortably, need RW to walk apartment to car 100 feet    Patient Stated Goals walk better, get around better    Currently in Pain? Yes    Pain Score 5     Pain Location Back    Pain Orientation Right;Left;Lower    Pain Type Chronic pain    Pain Radiating Towards both legs, front thighs to lower legs right side mainly    Aggravating Factors  walking, standing    Pain Relieving Factors rub leg and ice it              [facility] PT Assessment -  10/19/19 0001      Assessment   Medical Diagnosis frequent falls;decreased balanc e    Referring Provider (PT) Dr. Elberta Spaniel     Onset Date/Surgical Date --   worse in the last few months   Next MD Visit unsure    Prior Therapy HHPT      Precautions   Precautions Fall      Restrictions   Weight Bearing Restrictions No      Balance Screen   Has the patient fallen in the past 6 months Yes    How many times? 6    Has the patient had a decrease in activity level because of a fear of falling?  Yes    Is the patient reluctant to leave their home because of a fear of falling?  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 forward trunk lean       AROM   Overall AROM Comments UE ROM WFLS;  LE ROM WFLS except decreased right ankle dorsiflexion to neutral       Strength   Overall Strength Comments Requires significant UE use to rise sit to stand     Right Hip Flexion 4-/5    Right Hip ABduction 3+/5    Left Hip Flexion 3/5    Left Hip ABduction 3+/5    Right Knee Flexion 3+/5    Right Knee Extension 3+/5    Left Knee Flexion 3+/5    Left Knee Extension 3+/5    Right Ankle Dorsiflexion 3-/5    Right Ankle Plantar Flexion 3+/5    Left Ankle Dorsiflexion 3+/5    Left Ankle Plantar Flexion 3+/5      Ambulation/Gait   Ambulation/Gait Yes    Assistive device 4-wheeled walker    Gait Comments Very forward flexed with walker; knee giveway 2x during BERG test       Standardized Balance Assessment   Five times sit to  stand comments  needs UE assist 28.18 sec       Berg Balance Test   Sit to Stand Needs moderate or maximal  assist to stand    Standing Unsupported Unable to stand 30 seconds unassisted    Sitting with Back Unsupported but Feet Supported on Floor or Stool Able to sit safely and securely 2 minutes    Stand to Sit Controls descent by using hands    Transfers Able to transfer safely, definite need of hands    Standing Unsupported with Eyes Closed Needs help to keep from falling    Standing Unsupported with Feet Together Needs help to attain position and unable to hold for 15 seconds    From Standing, Reach Forward with Outstretched Arm Loses balance while trying/requires external support    From Standing Position, Pick up Object from Floor Unable to try/needs assist to keep balance    From Standing Position, Turn to Look Behind Over each Shoulder Needs assist to keep from losing balance and falling    Turn 360 Degrees Needs assistance while turning    Standing Unsupported, Alternately Place Feet on Step/Stool Needs assistance to keep from falling or unable to try    Standing Unsupported, One Foot in [employer] balance while stepping or standing    Standing on One Leg Unable to try or needs assist to prevent fall    Total Score 10      Timed Up and Go Test   Normal TUG (seconds) 32.66    TUG Comments with RW                      Objective measurements completed on examination: See above findings.                 PT Short Term Goals - 10/19/19 2107      PT SHORT TERM GOAL #1   Title Pt will be independent with his initial HEP to improve flexibility, strength and balance.     Time 6    Period Weeks    Status New    Target Date 11/30/19      PT SHORT TERM GOAL #2   Title perform 5x sit to stand in < or = to 25 seconds with UE support to improve balance    Time 6    Period Weeks    Status New      PT SHORT TERM GOAL #3   Title Timed up and Go time improved  to 29 sec or less    Time 6    Period Weeks    Status New      PT SHORT TERM GOAL #4   Title The patient will be able to walk 200 feet with RW needed for improved short distance community mobility    Time 6    Period Weeks    Status New             PT Long Term Goals - 10/19/19 2112      PT LONG TERM GOAL #1   Title Pt will demo independence with his advanced HEP to futher promote strength gains after d/c from PT.    Time 12    Period Weeks    Status New      PT LONG TERM GOAL #2   Title Pt will demo improved functional strength and power of the LEs evident by his ability to complete 5x/sit to stand in less than 22 sec with moderate UE use.    Time 12    Period Weeks    Status New      PT LONG  TERM GOAL #3   Title The patient will be able to walk for 3 minutes or 300 feet with RW needed for improved community mobility    Time 12    Period Weeks    Status New      PT LONG TERM GOAL #4   Title BERG balance score improved to 15/56    Time 12    Period Weeks    Status New      PT LONG TERM GOAL #5   Title Timed up and go improved to 22 sec indicating improved gait speed and safety    Time 12    Period Weeks    Status New                  Plan - 10/19/19 1416    Clinical Impression Statement The patient reports a declining functional status over the last few months.  He states 6 falls in the last 6 months when his legs give way.  He uses a RW on a full time basis with max distance from his home to the car about 100 feet.   His wife assists him with bathing (he sits on a bath bench) and he sits to shave.  He is limited by LBP and right LE pain as well as decreased stamina.  LE strength decreased bilaterally particularly with left hip flexion and right ankle dorsiflexion.  Timed up and Go 32.66 sec.  5x sit to stand 28 sec.  BERG 10/56 indicating 100% risk of falls.  He would benefit from PT to address numerous deficits.    Personal Factors and Comorbidities  Age;Fitness;Comorbidity 1;Comorbidity 2;Comorbidity 3+;Time since onset of injury/illness/exacerbation    Comorbidities COPD; peripheral neuropathy; renal failure; diabetes; heart disease;  chronic LBP    Examination-Activity Limitations Bathing;Locomotion Level;Transfers;Bend;Carry;Dressing;Lift;Stand;Hygiene/Grooming    Examination-Participation Restrictions Community Activity;Interpersonal Relationship;Other;Meal Prep    Stability/Clinical Decision Making Evolving/Moderate complexity    Clinical Decision Making Moderate    Rehab Potential Good    PT Frequency 2x / week    PT Duration 12 weeks    PT Treatment/Interventions ADLs/Self Care Home Management;Moist Heat;Neuromuscular re-education;Balance training;Therapeutic exercise;Therapeutic activities;Functional mobility training;Gait training;Patient/family education;Manual techniques;Dry needling    PT Next Visit Plan monitor O2 sats;  Nu-Step;  low level seated and short duration standing ex's for strengthening; initiate basic HEP; walking laps    Consulted and Agree with Plan of Care Patient           Patient will benefit from skilled therapeutic intervention in order to improve the following deficits and impairments:  Difficulty walking, Decreased endurance, Decreased activity tolerance, Impaired perceived functional ability, Pain, Decreased balance, Decreased mobility, Decreased strength, Postural dysfunction  Visit Diagnosis: Unsteadiness on feet  Muscle weakness (generalized)  Chronic low back pain, unspecified back pain laterality, unspecified whether sciatica present  Other abnormalities of gait and mobility     Problem List Patient Active Problem List   Diagnosis Date Noted  . Other chronic pain   . Intractable nausea and vomiting 01/19/2019  . Dehydration   . Intractable vomiting   . Hypokalemia   . GERD (gastroesophageal reflux disease) 11/07/2018  . History of CHF (congestive heart failure)   . Facial  cellulitis   . Acute on chronic congestive heart failure ([place])   . Gait abnormality 11/02/2018  . Cellulitis 11/01/2018  . Paroxysmal atrial fibrillation ([place]) 08/22/2018  . High risk medication use 08/22/2018  . Chronic anticoagulation 08/22/2018  . On amiodarone therapy 08/22/2018  .  Lumbar radiculopathy 05/05/2018  . Hypertensive heart disease 04/24/2018  . CKD (chronic kidney disease) 11/27/2015  . Chronic diastolic heart failure ([place]) 11/27/2015  . Normocytic anemia 11/27/2015  . AKI (acute kidney injury) ([place])   . Acute renal failure superimposed on stage 4 chronic kidney disease ([place]) 10/02/2015  . Hypotension 10/02/2015  . Anorexia   . Adjustment disorder with mixed emotional features 09/01/2015  . Acute encephalopathy 08/29/2015  . Elevated troponin 08/29/2015  . Renal cancer ([place]) 08/28/2015  . Spinal stenosis of lumbar region with radiculopathy 08/07/2015  . COPD (chronic obstructive pulmonary disease) ([place])   . OSA (obstructive sleep apnea) 07/27/2015  . HNP (herniated nucleus pulposus), lumbar 07/24/2015  . Renal cell carcinoma ([place]) 07/23/2015  . Essential hypertension 07/12/2015  . Renal mass 07/12/2015  . PMR (polymyalgia rheumatica) ([place]) 09/09/2013  . Rheumatoid arthritis ([place]) 04/13/2012  . CAD (coronary artery disease) 12/19/2011  . Type 2 diabetes mellitus with other specified complication ([place]) 97/94/8016  . Neuropathy ([place]) 12/19/2011   Ruben Im, PT 10/19/19 9:21 PM Phone: [phone] Fax: [phone] Alvera Singh 10/19/2019, 9:21 PM  [facility] [address] [place], [place], [address] Phone: [phone]   Fax:  [phone]  Name: Jacob [name] MRN: [id number] Date of Birth: [birth year]

## 2019-10-23 ENCOUNTER — Encounter: Payer: Self-pay | Admitting: Physical Therapy

## 2019-10-23 ENCOUNTER — Other Ambulatory Visit: Payer: Self-pay

## 2019-10-23 ENCOUNTER — Ambulatory Visit: Payer: No Typology Code available for payment source | Admitting: Physical Therapy

## 2019-10-23 DIAGNOSIS — R2681 Unsteadiness on feet: Secondary | ICD-10-CM | POA: Diagnosis not present

## 2019-10-23 DIAGNOSIS — R2689 Other abnormalities of gait and mobility: Secondary | ICD-10-CM

## 2019-10-23 DIAGNOSIS — G8929 Other chronic pain: Secondary | ICD-10-CM

## 2019-10-23 DIAGNOSIS — M545 Low back pain, unspecified: Secondary | ICD-10-CM

## 2019-10-23 DIAGNOSIS — M6281 Muscle weakness (generalized): Secondary | ICD-10-CM

## 2019-10-23 NOTE — Therapy (Signed)
[facility] [address] [place], [place], [address] Phone: [phone]   Fax:  [phone]  Physical Therapy Treatment  Patient Details  Name: Jacob [name] MRN: [id number] Date of Birth: 08-Aug-1940 Referring Provider (PT): Dr. Elberta Spaniel    Encounter Date: 10/23/2019   PT End of Session - 10/23/19 1217    Visit Number 2    Date for PT Re-Evaluation 01/09/20    Authorization Type VA 15 visits 09/08/19-01/09/20    PT Start Time 1147    PT Stop Time 1223    PT Time Calculation (min) 36 min    Activity Tolerance Patient tolerated treatment well;Patient limited by fatigue    Behavior During Therapy [facility] for tasks assessed/performed           Past Medical History:  Diagnosis Date  . Allergy   . Anemia   . Anxiety   . Arthritis   . Asthma   . BPH (benign prostatic hyperplasia)   . Cancer of kidney ([place])   . Cataract   . CHF (congestive heart failure) ([place])   . Chronic kidney disease    chronic  kidney failure  kidney function at 42%  . COPD (chronic obstructive pulmonary disease) ([place])   . Coronary artery disease    CABG  7 bypasses  . Diabetes mellitus without complication ([place])   . Facial cellulitis   . GERD (gastroesophageal reflux disease)   . Gout   . Hepatitis    many years ago  . Hyperlipidemia   . Hypertension   . Lumbar disc disease   . Obesity   . Paroxysmal atrial fibrillation (HCC)   . Peripheral neuropathy   . Polymyalgia rheumatica (HCC)    maintained on Prednisone, Plaquenil. Followed by rhuematology every 4 months/[name].  . Shortness of breath dyspnea    with exertion  . Sleep apnea    CPAP   Trying to use    Past Surgical History:  Procedure Laterality Date  . APPENDECTOMY    . BIOPSY  01/23/2019   Procedure: BIOPSY;  Surgeon: Otis Brace, MD;  Location: [facility];  Service: Gastroenterology;;  . [place] 2000   left  . CATARACT EXTRACTION W/PHACO  Right 11/28/2014   Procedure: CATARACT EXTRACTION PHACO AND INTRAOCULAR LENS PLACEMENT ([place]) RIGHT ;  Surgeon: Marylynn Pearson, MD;  Location: [place];  Service: Ophthalmology;  Laterality: Right;  . CORONARY ARTERY BYPASS GRAFT  03/17/1995   Wynonia Lawman; followed every six months.  . ESOPHAGOGASTRODUODENOSCOPY (EGD) WITH PROPOFOL Left 01/23/2019   Procedure: ESOPHAGOGASTRODUODENOSCOPY (EGD) WITH PROPOFOL;  Surgeon: Otis Brace, MD;  [facility];  Service: Gastroenterology;  Laterality: Left;  . HERNIA REPAIR    . LUMBAR LAMINECTOMY/DECOMPRESSION MICRODISCECTOMY N/A 07/30/2015   Procedure: LUMBAR LAMINECTOMY DISCECTOMY ;  Surgeon: Ashok Pall, MD;  Location: [place] [facility];  Service: Neurosurgery;  Laterality: N/A;  LUMBAR LAMINECTOMY DISCECTOMY   . LUMBAR LAMINECTOMY/DECOMPRESSION MICRODISCECTOMY N/A 09/06/2015   Procedure: Redo L3/4 Disectomy;  Surgeon: Ashok Pall, MD;  Location: [place] [facility];  Service: Neurosurgery;  Laterality: N/A;  . PROSTATE SURGERY     TURP at [place].  Marland Kitchen TRANSURETHRAL RESECTION OF PROSTATE      There were no vitals filed for this visit.   Subjective Assessment - 10/23/19 1151    Subjective MY right leg is killing me today.    Pertinent History had HHPT 2 months ago after hospitalized 5 days related to kidney    Currently in  Pain? Yes    Pain Score 6     Pain Location Leg    Pain Orientation Right    Pain Descriptors / Indicators Sore;Aching;Radiating    Multiple Pain Sites No                             [facility] Adult PT Treatment/Exercise - 10/23/19 0001      Knee/Hip Exercises: Aerobic   Nustep L1 x 5 min PTA present to monitor breathing      Knee/Hip Exercises: Standing   Other Standing Knee Exercises 2 laps 160 feet with RW without stopping   Vc for posture     Knee/Hip Exercises: Seated   Long Arc Quad Strengthening;Both;1 set;10 reps;Weights    Long Arc Quad Weight 2 lbs.    Ball Squeeze 3 sec hold 10x     Clamshell with TheraBand  --   blue loop 2x10   Marching Strengthening;Both;1 set;10 reps;Weights    Marching Weights 2 lbs.                    PT Short Term Goals - 10/19/19 2107      PT SHORT TERM GOAL #1   Title Pt will be independent with his initial HEP to improve flexibility, strength and balance.     Time 6    Period Weeks    Status New    Target Date 11/30/19      PT SHORT TERM GOAL #2   Title perform 5x sit to stand in < or = to 25 seconds with UE support to improve balance    Time 6    Period Weeks    Status New      PT SHORT TERM GOAL #3   Title Timed up and Go time improved to 29 sec or less    Time 6    Period Weeks    Status New      PT SHORT TERM GOAL #4   Title The patient will be able to walk 200 feet with RW needed for improved short distance community mobility    Time 6    Period Weeks    Status New             PT Long Term Goals - 10/19/19 2112      PT LONG TERM GOAL #1   Title Pt will demo independence with his advanced HEP to futher promote strength gains after d/c from PT.    Time 12    Period Weeks    Status New      PT LONG TERM GOAL #2   Title Pt will demo improved functional strength and power of the LEs evident by his ability to complete 5x/sit to stand in less than 22 sec with moderate UE use.    Time 12    Period Weeks    Status New      PT LONG TERM GOAL #3   Title The patient will be able to walk for 3 minutes or 300 feet with RW needed for improved community mobility    Time 12    Period Weeks    Status New      PT LONG TERM GOAL #4   Title BERG balance score improved to 15/56    Time 12    Period Weeks    Status New      PT LONG TERM GOAL #5   Title  Timed up and go improved to 22 sec indicating improved gait speed and safety    Time 12    Period Weeks    Status New                 Plan - 10/23/19 1157    Clinical Impression Statement Pt  arrives with RT leg pain. This pain did nt limit his LE seated exercises. Pt  hasbeen doing his old chair exercises at home. Today we challenged them by adding 2# weights. Breathing was difficult for pt throughout the session. Pt ablulated 160 feet ( 2 laps) with RW. Pt required continual postural verbal cues and had increased LBP. O2 was monitored  throughout and was stable at 97%.    Personal Factors and Comorbidities Age;Fitness;Comorbidity 1;Comorbidity 2;Comorbidity 3+;Time since onset of injury/illness/exacerbation    Comorbidities COPD; peripheral neuropathy; renal failure; diabetes; heart disease;  chronic LBP    Examination-Activity Limitations Bathing;Locomotion Level;Transfers;Bend;Carry;Dressing;Lift;Stand;Hygiene/Grooming    Examination-Participation Restrictions Community Activity;Interpersonal Relationship;Other;Meal Prep    Stability/Clinical Decision Making Evolving/Moderate complexity    Rehab Potential Good    PT Frequency 2x / week    PT Duration 12 weeks    PT Treatment/Interventions ADLs/Self Care Home Management;Moist Heat;Neuromuscular re-education;Balance training;Therapeutic exercise;Therapeutic activities;Functional mobility training;Gait training;Patient/family education;Manual techniques;Dry needling    PT Next Visit Plan monitor O2 sats;  Nu-Step;  low level seated and short duration standing ex's for strengthening; walking laps    Consulted and Agree with Plan of Care Patient           Patient will benefit from skilled therapeutic intervention in order to improve the following deficits and impairments:  Difficulty walking, Decreased endurance, Decreased activity tolerance, Impaired perceived functional ability, Pain, Decreased balance, Decreased mobility, Decreased strength, Postural dysfunction  Visit Diagnosis: Unsteadiness on feet  Muscle weakness (generalized)  Chronic low back pain, unspecified back pain laterality, unspecified whether sciatica present  Other abnormalities of gait and mobility     Problem List Patient Active  Problem List   Diagnosis Date Noted  . Other chronic pain   . Intractable nausea and vomiting 01/19/2019  . Dehydration   . Intractable vomiting   . Hypokalemia   . GERD (gastroesophageal reflux disease) 11/07/2018  . History of CHF (congestive heart failure)   . Facial cellulitis   . Acute on chronic congestive heart failure ([place])   . Gait abnormality 11/02/2018  . Cellulitis 11/01/2018  . Paroxysmal atrial fibrillation ([place]) 08/22/2018  . High risk medication use 08/22/2018  . Chronic anticoagulation 08/22/2018  . On amiodarone therapy 08/22/2018  . Lumbar radiculopathy 05/05/2018  . Hypertensive heart disease 04/24/2018  . CKD (chronic kidney disease) 11/27/2015  . Chronic diastolic heart failure ([place]) 11/27/2015  . Normocytic anemia 11/27/2015  . AKI (acute kidney injury) ([place])   . Acute renal failure superimposed on stage 4 chronic kidney disease ([place]) 10/02/2015  . Hypotension 10/02/2015  . Anorexia   . Adjustment disorder with mixed emotional features 09/01/2015  . Acute encephalopathy 08/29/2015  . Elevated troponin 08/29/2015  . Renal cancer ([place]) 08/28/2015  . Spinal stenosis of lumbar region with radiculopathy 08/07/2015  . COPD (chronic obstructive pulmonary disease) ([place])   . OSA (obstructive sleep apnea) 07/27/2015  . HNP (herniated nucleus pulposus), lumbar 07/24/2015  . Renal cell carcinoma ([place]) 07/23/2015  . Essential hypertension 07/12/2015  . Renal mass 07/12/2015  . PMR (polymyalgia rheumatica) ([place]) 09/09/2013  . Rheumatoid arthritis ([place]) 04/13/2012  . CAD (coronary artery disease) 12/19/2011  .  Type 2 diabetes mellitus with other specified complication ([place]) 75/79/7282  . Neuropathy ([place]) 12/19/2011    COCHRAN,JENNIFER, PTA 10/23/2019, 12:25 PM  [facility] [address] [place], [place], [address] Phone: [phone]   Fax:  [phone]  Name: Jacob [name] MRN: [id number] Date  of Birth: 06-06-1940

## 2019-10-25 ENCOUNTER — Other Ambulatory Visit: Payer: Self-pay

## 2019-10-25 ENCOUNTER — Encounter: Payer: Self-pay | Admitting: Physical Therapy

## 2019-10-25 ENCOUNTER — Ambulatory Visit: Payer: No Typology Code available for payment source | Admitting: Physical Therapy

## 2019-10-25 DIAGNOSIS — G8929 Other chronic pain: Secondary | ICD-10-CM

## 2019-10-25 DIAGNOSIS — R2689 Other abnormalities of gait and mobility: Secondary | ICD-10-CM

## 2019-10-25 DIAGNOSIS — M6281 Muscle weakness (generalized): Secondary | ICD-10-CM

## 2019-10-25 DIAGNOSIS — R2681 Unsteadiness on feet: Secondary | ICD-10-CM

## 2019-10-25 DIAGNOSIS — M545 Low back pain, unspecified: Secondary | ICD-10-CM

## 2019-10-25 NOTE — Therapy (Signed)
[facility] [address], [place], [place], [address] Phone: [phone]   Fax:  [phone]  Physical Therapy Treatment  Patient Details  Name: Jacob [name] MRN: [id number] Date of Birth: 1941-02-17 Referring Provider (PT): Dr. Elberta Spaniel    Encounter Date: 10/25/2019   PT End of Session - 10/25/19 1146    Visit Number 3    Authorization Type VA 15 visits 09/08/19-01/09/20    PT Start Time 7035   Ended session early due to pain levels   PT Stop Time 1214    PT Time Calculation (min) 29 min    Activity Tolerance Patient tolerated treatment well;Patient limited by pain    Behavior During Therapy [facility] for tasks assessed/performed           Past Medical History:  Diagnosis Date  . Allergy   . Anemia   . Anxiety   . Arthritis   . Asthma   . BPH (benign prostatic hyperplasia)   . Cancer of kidney ([place])   . Cataract   . CHF (congestive heart failure) ([place])   . Chronic kidney disease    chronic  kidney failure  kidney function at 42%  . COPD (chronic obstructive pulmonary disease) ([place])   . Coronary artery disease    CABG  7 bypasses  . Diabetes mellitus without complication ([place])   . Facial cellulitis   . GERD (gastroesophageal reflux disease)   . Gout   . Hepatitis    many years ago  . Hyperlipidemia   . Hypertension   . Lumbar disc disease   . Obesity   . Paroxysmal atrial fibrillation (HCC)   . Peripheral neuropathy   . Polymyalgia rheumatica (HCC)    maintained on Prednisone, Plaquenil. Followed by rhuematology every 4 months/[name].  . Shortness of breath dyspnea    with exertion  . Sleep apnea    CPAP   Trying to use    Past Surgical History:  Procedure Laterality Date  . APPENDECTOMY    . BIOPSY  01/23/2019   Procedure: BIOPSY;  Surgeon: Otis Brace, MD;  [facility];  Service: Gastroenterology;;  . [place] 2000   left  . CATARACT EXTRACTION W/PHACO  Right 11/28/2014   Procedure: CATARACT EXTRACTION PHACO AND INTRAOCULAR LENS PLACEMENT ([place]) RIGHT ;  Surgeon: Marylynn Pearson, MD;  Location: [place];  Service: Ophthalmology;  Laterality: Right;  . CORONARY ARTERY BYPASS GRAFT  03/17/1995   Wynonia Lawman; followed every six months.  . ESOPHAGOGASTRODUODENOSCOPY (EGD) WITH PROPOFOL Left 01/23/2019   Procedure: ESOPHAGOGASTRODUODENOSCOPY (EGD) WITH PROPOFOL;  Surgeon: Otis Brace, MD;  [facility];  Service: Gastroenterology;  Laterality: Left;  . HERNIA REPAIR    . LUMBAR LAMINECTOMY/DECOMPRESSION MICRODISCECTOMY N/A 07/30/2015   Procedure: LUMBAR LAMINECTOMY DISCECTOMY ;  Surgeon: Ashok Pall, MD;  Location: [place] [facility];  Service: Neurosurgery;  Laterality: N/A;  LUMBAR LAMINECTOMY DISCECTOMY   . LUMBAR LAMINECTOMY/DECOMPRESSION MICRODISCECTOMY N/A 09/06/2015   Procedure: Redo L3/4 Disectomy;  Surgeon: Ashok Pall, MD;  Location: [place] [facility];  Service: Neurosurgery;  Laterality: N/A;  . PROSTATE SURGERY     TURP at [place].  Marland Kitchen TRANSURETHRAL RESECTION OF PROSTATE      There were no vitals filed for this visit.   Subjective Assessment - 10/25/19 1152    Subjective My back pain has been pretty bad since last session. Still hurting today.    Pertinent History had HHPT 2 months ago after hospitalized 5 days related to  kidney    Currently in Pain? Yes    Pain Score 8     Pain Location Back    Pain Orientation Lower                             [facility] Adult PT Treatment/Exercise - 10/25/19 0001      Knee/Hip Exercises: Aerobic   Nustep L1 x 6 min with MHP to lumbar      Knee/Hip Exercises: Seated   Long Arc Quad AROM;Strengthening;Both;2 sets;10 reps    Long Arc Quad Weight --   2# on LT, 0# on RT secondary to signif RT hip pain   Ball Squeeze 3 sec hold 15x     Clamshell with TheraBand --   blue loop10x but stopped due to pain   Marching Strengthening;Both;1 set;20 reps;Weights    Marching Weights 0 lbs.                     PT Short Term Goals - 10/19/19 2107      PT SHORT TERM GOAL #1   Title Pt will be independent with his initial HEP to improve flexibility, strength and balance.     Time 6    Period Weeks    Status New    Target Date 11/30/19      PT SHORT TERM GOAL #2   Title perform 5x sit to stand in < or = to 25 seconds with UE support to improve balance    Time 6    Period Weeks    Status New      PT SHORT TERM GOAL #3   Title Timed up and Go time improved to 29 sec or less    Time 6    Period Weeks    Status New      PT SHORT TERM GOAL #4   Title The patient will be able to walk 200 feet with RW needed for improved short distance community mobility    Time 6    Period Weeks    Status New             PT Long Term Goals - 10/19/19 2112      PT LONG TERM GOAL #1   Title Pt will demo independence with his advanced HEP to futher promote strength gains after d/c from PT.    Time 12    Period Weeks    Status New      PT LONG TERM GOAL #2   Title Pt will demo improved functional strength and power of the LEs evident by his ability to complete 5x/sit to stand in less than 22 sec with moderate UE use.    Time 12    Period Weeks    Status New      PT LONG TERM GOAL #3   Title The patient will be able to walk for 3 minutes or 300 feet with RW needed for improved community mobility    Time 12    Period Weeks    Status New      PT LONG TERM GOAL #4   Title BERG balance score improved to 15/56    Time 12    Period Weeks    Status New      PT LONG TERM GOAL #5   Title Timed up and go improved to 22 sec indicating improved gait speed and safety    Time 12  Period Weeks    Status New                 Plan - 10/25/19 1147    Clinical Impression Statement Pt arrives to PT with significant low back pain and Rt hip pain. This pain limited his exercises, he could only do AROM on the RT hip today. Pt was able to perform limited seated exercises with a  heating pad to his back. PTA asked pt to bring his TENS unit in next session and we would put it on for him.    Personal Factors and Comorbidities Age;Fitness;Comorbidity 1;Comorbidity 2;Comorbidity 3+;Time since onset of injury/illness/exacerbation    Comorbidities COPD; peripheral neuropathy; renal failure; diabetes; heart disease;  chronic LBP    Examination-Activity Limitations Bathing;Locomotion Level;Transfers;Bend;Carry;Dressing;Lift;Stand;Hygiene/Grooming    Examination-Participation Restrictions Community Activity;Interpersonal Relationship;Other;Meal Prep    Stability/Clinical Decision Making Evolving/Moderate complexity    Rehab Potential Good    PT Frequency 2x / week    PT Duration 12 weeks    PT Treatment/Interventions ADLs/Self Care Home Management;Moist Heat;Neuromuscular re-education;Balance training;Therapeutic exercise;Therapeutic activities;Functional mobility training;Gait training;Patient/family education;Manual techniques;Dry needling    PT Next Visit Plan Pt may bring TENS unit next session for low back pain. Low level chair exs.    Consulted and Agree with Plan of Care Patient           Patient will benefit from skilled therapeutic intervention in order to improve the following deficits and impairments:  Difficulty walking, Decreased endurance, Decreased activity tolerance, Impaired perceived functional ability, Pain, Decreased balance, Decreased mobility, Decreased strength, Postural dysfunction  Visit Diagnosis: Unsteadiness on feet  Muscle weakness (generalized)  Chronic low back pain, unspecified back pain laterality, unspecified whether sciatica present  Other abnormalities of gait and mobility     Problem List Patient Active Problem List   Diagnosis Date Noted  . Other chronic pain   . Intractable nausea and vomiting 01/19/2019  . Dehydration   . Intractable vomiting   . Hypokalemia   . GERD (gastroesophageal reflux disease) 11/07/2018  . History  of CHF (congestive heart failure)   . Facial cellulitis   . Acute on chronic congestive heart failure ([place])   . Gait abnormality 11/02/2018  . Cellulitis 11/01/2018  . Paroxysmal atrial fibrillation ([place]) 08/22/2018  . High risk medication use 08/22/2018  . Chronic anticoagulation 08/22/2018  . On amiodarone therapy 08/22/2018  . Lumbar radiculopathy 05/05/2018  . Hypertensive heart disease 04/24/2018  . CKD (chronic kidney disease) 11/27/2015  . Chronic diastolic heart failure ([place]) 11/27/2015  . Normocytic anemia 11/27/2015  . AKI (acute kidney injury) ([place])   . Acute renal failure superimposed on stage 4 chronic kidney disease ([place]) 10/02/2015  . Hypotension 10/02/2015  . Anorexia   . Adjustment disorder with mixed emotional features 09/01/2015  . Acute encephalopathy 08/29/2015  . Elevated troponin 08/29/2015  . Renal cancer ([place]) 08/28/2015  . Spinal stenosis of lumbar region with radiculopathy 08/07/2015  . COPD (chronic obstructive pulmonary disease) ([place])   . OSA (obstructive sleep apnea) 07/27/2015  . HNP (herniated nucleus pulposus), lumbar 07/24/2015  . Renal cell carcinoma ([place]) 07/23/2015  . Essential hypertension 07/12/2015  . Renal mass 07/12/2015  . PMR (polymyalgia rheumatica) ([place]) 09/09/2013  . Rheumatoid arthritis ([place]) 04/13/2012  . CAD (coronary artery disease) 12/19/2011  . Type 2 diabetes mellitus with other specified complication ([place]) 26/94/8546  . Neuropathy ([place]) 12/19/2011    Jeany Seville, PTA 10/25/2019, 12:18 PM  [facility] [address]  [place], [place], [place], [address] Phone: [phone]   Fax:  [phone]  Name: Jacob [name] MRN: [id number] Date of Birth: 02-15-1941

## 2019-10-30 ENCOUNTER — Encounter: Payer: Self-pay | Admitting: Physical Therapy

## 2019-10-30 ENCOUNTER — Ambulatory Visit: Payer: No Typology Code available for payment source | Admitting: Physical Therapy

## 2019-10-30 ENCOUNTER — Other Ambulatory Visit: Payer: Self-pay

## 2019-10-30 DIAGNOSIS — M545 Low back pain, unspecified: Secondary | ICD-10-CM

## 2019-10-30 DIAGNOSIS — G8929 Other chronic pain: Secondary | ICD-10-CM

## 2019-10-30 DIAGNOSIS — R2681 Unsteadiness on feet: Secondary | ICD-10-CM

## 2019-10-30 DIAGNOSIS — M6281 Muscle weakness (generalized): Secondary | ICD-10-CM

## 2019-10-30 DIAGNOSIS — R2689 Other abnormalities of gait and mobility: Secondary | ICD-10-CM

## 2019-10-30 NOTE — Therapy (Signed)
[facility] [address] [place], [place], [address] Phone: [phone]   Fax:  [phone]  Physical Therapy Treatment  Patient Details  Name: Jacob [name] MRN: [id number] Date of Birth: 12/12/1940 Referring Provider (PT): Dr. Elberta Spaniel    Encounter Date: 10/30/2019   PT End of Session - 10/30/19 1446    Visit Number 4    Date for PT Re-Evaluation 01/09/20    Authorization Type VA 15 visits 09/08/19-01/09/20    PT Start Time 1446    PT Stop Time 1530    PT Time Calculation (min) 44 min    Activity Tolerance Patient tolerated treatment well;Patient limited by pain    Behavior During Therapy [facility] for tasks assessed/performed           Past Medical History:  Diagnosis Date  . Allergy   . Anemia   . Anxiety   . Arthritis   . Asthma   . BPH (benign prostatic hyperplasia)   . Cancer of kidney ([place])   . Cataract   . CHF (congestive heart failure) ([place])   . Chronic kidney disease    chronic  kidney failure  kidney function at 42%  . COPD (chronic obstructive pulmonary disease) ([place])   . Coronary artery disease    CABG  7 bypasses  . Diabetes mellitus without complication ([place])   . Facial cellulitis   . GERD (gastroesophageal reflux disease)   . Gout   . Hepatitis    many years ago  . Hyperlipidemia   . Hypertension   . Lumbar disc disease   . Obesity   . Paroxysmal atrial fibrillation (HCC)   . Peripheral neuropathy   . Polymyalgia rheumatica (HCC)    maintained on Prednisone, Plaquenil. Followed by rhuematology every 4 months/[name].  . Shortness of breath dyspnea    with exertion  . Sleep apnea    CPAP   Trying to use    Past Surgical History:  Procedure Laterality Date  . APPENDECTOMY    . BIOPSY  01/23/2019   Procedure: BIOPSY;  Surgeon: Otis Brace, MD;  [facility]: [facility];  Service: Gastroenterology;;  . [place] 2000   left  . CATARACT EXTRACTION W/PHACO  Right 11/28/2014   Procedure: CATARACT EXTRACTION PHACO AND INTRAOCULAR LENS PLACEMENT ([place]) RIGHT ;  Surgeon: Marylynn Pearson, MD;  Location: [place];  Service: Ophthalmology;  Laterality: Right;  . CORONARY ARTERY BYPASS GRAFT  03/17/1995   [place] Lawman; followed every six months.  . ESOPHAGOGASTRODUODENOSCOPY (EGD) WITH PROPOFOL Left 01/23/2019   Procedure: ESOPHAGOGASTRODUODENOSCOPY (EGD) WITH PROPOFOL;  Surgeon: Otis Brace, MD;  [facility];  Service: Gastroenterology;  Laterality: Left;  . HERNIA REPAIR    . LUMBAR LAMINECTOMY/DECOMPRESSION MICRODISCECTOMY N/A 07/30/2015   Procedure: LUMBAR LAMINECTOMY DISCECTOMY ;  Surgeon: Ashok Pall, MD;  Location: [place] [facility];  Service: Neurosurgery;  Laterality: N/A;  LUMBAR LAMINECTOMY DISCECTOMY   . LUMBAR LAMINECTOMY/DECOMPRESSION MICRODISCECTOMY N/A 09/06/2015   Procedure: Redo L3/4 Disectomy;  Surgeon: Ashok Pall, MD;  Location: [place] [facility];  Service: Neurosurgery;  Laterality: N/A;  . PROSTATE SURGERY     TURP at [place].  Marland Kitchen TRANSURETHRAL RESECTION OF PROSTATE      There were no vitals filed for this visit.   Subjective Assessment - 10/30/19 1447    Subjective I am not much better than when I was here last time. My Rt leg and back are still hurting. I go to the MD on the 24th. My  legs really hurt after we do exercises.    Pertinent History had HHPT 2 months ago after hospitalized 5 days related to kidney    Currently in Pain? Yes    Pain Score 5    8/10 in my leg   Pain Location Back    Pain Orientation Right    Pain Descriptors / Indicators Radiating    Aggravating Factors  constant    Pain Relieving Factors ice sometimes,    Multiple Pain Sites No                             [facility] Adult PT Treatment/Exercise - 10/30/19 0001      Lumbar Exercises: Stretches   Other Lumbar Stretch Exercise Seated side bends 5x Bil       Lumbar Exercises: Seated   Other Seated Lumbar Exercises Small  ball compresisons for  abdominal compressions 5 sec hold 10x, VC for forced exhale   yellow band lifts for multifidus contraction 10x   Other Seated Lumbar Exercises Blue ball arms only side to side holding trrunk still 5x bil    Bicep curls 6# Bil 10x     Knee/Hip Exercises: Aerobic   Nustep L1 x 55min with PTA present to discuss status and pain      Knee/Hip Exercises: Standing   Gait Training Tried 1 lap ( 80 ft) of clinic with RW. increased hip pain.       Knee/Hip Exercises: Seated   Sit to Sand --   Ankle rocker board x 2 min                   PT Short Term Goals - 10/19/19 2107      PT SHORT TERM GOAL #1   Title Pt will be independent with his initial HEP to improve flexibility, strength and balance.     Time 6    Period Weeks    Status New    Target Date 11/30/19      PT SHORT TERM GOAL #2   Title perform 5x sit to stand in < or = to 25 seconds with UE support to improve balance    Time 6    Period Weeks    Status New      PT SHORT TERM GOAL #3   Title Timed up and Go time improved to 29 sec or less    Time 6    Period Weeks    Status New      PT SHORT TERM GOAL #4   Title The patient will be able to walk 200 feet with RW needed for improved short distance community mobility    Time 6    Period Weeks    Status New             PT Long Term Goals - 10/19/19 2112      PT LONG TERM GOAL #1   Title Pt will demo independence with his advanced HEP to futher promote strength gains after d/c from PT.    Time 12    Period Weeks    Status New      PT LONG TERM GOAL #2   Title Pt will demo improved functional strength and power of the LEs evident by his ability to complete 5x/sit to stand in less than 22 sec with moderate UE use.    Time 12    Period Weeks    Status New  PT LONG TERM GOAL #3   Title The patient will be able to walk for 3 minutes or 300 feet with RW needed for improved community mobility    Time 12    Period Weeks    Status New      PT LONG TERM  GOAL #4   Title BERG balance score improved to 15/56    Time 12    Period Weeks    Status New      PT LONG TERM GOAL #5   Title Timed up and go improved to 22 sec indicating improved gait speed and safety    Time 12    Period Weeks    Status New                 Plan - 10/30/19 1510    Clinical Impression Statement Pt arrives in about the same condition as last session: Rt low back pain tha radiates into the Rt leg. These are not new symptoms for pt, but he does report worsening intensity. LE exercises seem to increase pain ( even after session is over) so we declined any LE movement except ankle rocker board to help  from increasing his leg pain. Pt cannot lay supine per his report. pt was able to do low level core and back strengthening seated without too much pain. Pt was able to tolerate longer session by eliminating LE exercises.    PT Next Visit Plan Pt may bring TENS unit next session for low back pain. Low level chair exs focusing on core and UE strength and endurance.    Consulted and Agree with Plan of Care Patient           Patient will benefit from skilled therapeutic intervention in order to improve the following deficits and impairments:  Difficulty walking, Decreased endurance, Decreased activity tolerance, Impaired perceived functional ability, Pain, Decreased balance, Decreased mobility, Decreased strength, Postural dysfunction  Visit Diagnosis: Unsteadiness on feet  Muscle weakness (generalized)  Chronic low back pain, unspecified back pain laterality, unspecified whether sciatica present  Other abnormalities of gait and mobility     Problem List Patient Active Problem List   Diagnosis Date Noted  . Other chronic pain   . Intractable nausea and vomiting 01/19/2019  . Dehydration   . Intractable vomiting   . Hypokalemia   . GERD (gastroesophageal reflux disease) 11/07/2018  . History of CHF (congestive heart failure)   . Facial cellulitis   .  Acute on chronic congestive heart failure ([place])   . Gait abnormality 11/02/2018  . Cellulitis 11/01/2018  . Paroxysmal atrial fibrillation ([place]) 08/22/2018  . High risk medication use 08/22/2018  . Chronic anticoagulation 08/22/2018  . On amiodarone therapy 08/22/2018  . Lumbar radiculopathy 05/05/2018  . Hypertensive heart disease 04/24/2018  . CKD (chronic kidney disease) 11/27/2015  . Chronic diastolic heart failure ([place]) 11/27/2015  . Normocytic anemia 11/27/2015  . AKI (acute kidney injury) ([place])   . Acute renal failure superimposed on stage 4 chronic kidney disease ([place]) 10/02/2015  . Hypotension 10/02/2015  . Anorexia   . Adjustment disorder with mixed emotional features 09/01/2015  . Acute encephalopathy 08/29/2015  . Elevated troponin 08/29/2015  . Renal cancer ([place]) 08/28/2015  . Spinal stenosis of lumbar region with radiculopathy 08/07/2015  . COPD (chronic obstructive pulmonary disease) ([place])   . OSA (obstructive sleep apnea) 07/27/2015  . HNP (herniated nucleus pulposus), lumbar 07/24/2015  . Renal cell carcinoma ([place]) 07/23/2015  . Essential hypertension 07/12/2015  .  Renal mass 07/12/2015  . PMR (polymyalgia rheumatica) ([place]) 09/09/2013  . Rheumatoid arthritis ([place]) 04/13/2012  . CAD (coronary artery disease) 12/19/2011  . Type 2 diabetes mellitus with other specified complication ([place]) 41/96/2229  . Neuropathy ([place]) 12/19/2011    COCHRAN,JENNIFER, PTA 10/30/2019, 3:23 PM  [facility] [address] [place], [place], [address] Phone: [phone]   Fax:  [phone]  Name: KERIC ZEHREN MRN: [id number] Date of Birth: 25-Jan-1941

## 2019-11-01 ENCOUNTER — Encounter: Payer: Self-pay | Admitting: Physical Therapy

## 2019-11-01 ENCOUNTER — Other Ambulatory Visit: Payer: Self-pay

## 2019-11-01 ENCOUNTER — Ambulatory Visit: Payer: No Typology Code available for payment source | Admitting: Physical Therapy

## 2019-11-01 DIAGNOSIS — R2681 Unsteadiness on feet: Secondary | ICD-10-CM

## 2019-11-01 DIAGNOSIS — R2689 Other abnormalities of gait and mobility: Secondary | ICD-10-CM

## 2019-11-01 DIAGNOSIS — M545 Low back pain, unspecified: Secondary | ICD-10-CM

## 2019-11-01 DIAGNOSIS — G8929 Other chronic pain: Secondary | ICD-10-CM

## 2019-11-01 DIAGNOSIS — M6281 Muscle weakness (generalized): Secondary | ICD-10-CM

## 2019-11-01 NOTE — Therapy (Signed)
[facility] [address], [place], [place], [address] Phone: [phone]   Fax:  [phone]  Physical Therapy Treatment  Patient Details  Name: Jacob [name] MRN: [id number] Date of Birth: April 18, 1940 Referring Provider (PT): Dr. Elberta Spaniel    Encounter Date: 11/01/2019   PT End of Session - 11/01/19 1142    Visit Number 5    Date for PT Re-Evaluation 01/09/20    Authorization Type VA 15 visits 09/08/19-01/09/20    PT Start Time 1140    PT Stop Time 1213    PT Time Calculation (min) 33 min    Activity Tolerance Patient tolerated treatment well;Patient limited by pain    Behavior During Therapy [facility] for tasks assessed/performed           Past Medical History:  Diagnosis Date  . Allergy   . Anemia   . Anxiety   . Arthritis   . Asthma   . BPH (benign prostatic hyperplasia)   . Cancer of kidney ([place])   . Cataract   . CHF (congestive heart failure) ([place])   . Chronic kidney disease    chronic  kidney failure  kidney function at 42%  . COPD (chronic obstructive pulmonary disease) ([place])   . Coronary artery disease    CABG  7 bypasses  . Diabetes mellitus without complication ([place])   . Facial cellulitis   . GERD (gastroesophageal reflux disease)   . Gout   . Hepatitis    many years ago  . Hyperlipidemia   . Hypertension   . Lumbar disc disease   . Obesity   . Paroxysmal atrial fibrillation (HCC)   . Peripheral neuropathy   . Polymyalgia rheumatica (HCC)    maintained on Prednisone, Plaquenil. Followed by rhuematology every 4 months/[name].  . Shortness of breath dyspnea    with exertion  . Sleep apnea    CPAP   Trying to use    Past Surgical History:  Procedure Laterality Date  . APPENDECTOMY    . BIOPSY  01/23/2019   Procedure: BIOPSY;  Surgeon: Otis Brace, MD;  [facility];  Service: Gastroenterology;;  . [place] 2000   left  . CATARACT EXTRACTION W/PHACO  Right 11/28/2014   Procedure: CATARACT EXTRACTION PHACO AND INTRAOCULAR LENS PLACEMENT ([place]) RIGHT ;  Surgeon: Marylynn Pearson, MD;  Location: [place];  Service: Ophthalmology;  Laterality: Right;  . CORONARY ARTERY BYPASS GRAFT  03/17/1995   [place] Lawman; followed every six months.  . ESOPHAGOGASTRODUODENOSCOPY (EGD) WITH PROPOFOL Left 01/23/2019   Procedure: ESOPHAGOGASTRODUODENOSCOPY (EGD) WITH PROPOFOL;  Surgeon: Otis Brace, MD;  [facility];  Service: Gastroenterology;  Laterality: Left;  . HERNIA REPAIR    . LUMBAR LAMINECTOMY/DECOMPRESSION MICRODISCECTOMY N/A 07/30/2015   Procedure: LUMBAR LAMINECTOMY DISCECTOMY ;  Surgeon: Ashok Pall, MD;  Location: [place] [facility];  Service: Neurosurgery;  Laterality: N/A;  LUMBAR LAMINECTOMY DISCECTOMY   . LUMBAR LAMINECTOMY/DECOMPRESSION MICRODISCECTOMY N/A 09/06/2015   Procedure: Redo L3/4 Disectomy;  Surgeon: Ashok Pall, MD;  Location: [place] [facility];  Service: Neurosurgery;  Laterality: N/A;  . PROSTATE SURGERY     TURP at [place].  Marland Kitchen TRANSURETHRAL RESECTION OF PROSTATE      There were no vitals filed for this visit.   Subjective Assessment - 11/01/19 1143    Subjective My back pain is still awful. Had terrible night last night. i am not interested in pain medication. My TENS unit doesn't help much even if I crank it  up.    Pertinent History had HHPT 2 months ago after hospitalized 5 days related to kidney    Currently in Pain? Yes    Pain Score 5    5/10 sitting/resting; night time & walking 8/10   Pain Location Back    Pain Orientation Lower    Pain Descriptors / Indicators Sore;Sharp;Constant    Aggravating Factors  Constant    Pain Relieving Factors Heat probably    Multiple Pain Sites No                             [facility] Adult PT Treatment/Exercise - 11/01/19 0001      Lumbar Exercises: Stretches   Other Lumbar Stretch Exercise Seated side bends 10x Bil More like AROm, no holding       Lumbar Exercises: Seated    Sit to Stand Limitations Red band rows 2x15    Other Seated Lumbar Exercises Small  ball compresisons for abdominal compressions 5 sec hold 10x, VC for forced exhale   yellow band lifts for multifidus contraction 10x   Other Seated Lumbar Exercises Blue ball arms only side to side holding trrunk still 10x bil    Bicep curls 7# Bil 10x2     Lumbar Exercises: Supine   Other Supine Lumbar Exercises Ankle DF/PF 4 min on rocker board      Knee/Hip Exercises: Aerobic   Nustep L1 x 8 min: no increase in LBP, MHP to back      Knee/Hip Exercises: Standing   Gait Training Tried 1 lap ( 80 ft)  + 30 feet otowards the door  RW. increased back pain.       Knee/Hip Exercises: Seated   Sit to Sand 5 reps;with UE support   2 sets                   PT Short Term Goals - 10/19/19 2107      PT SHORT TERM GOAL #1   Title Pt will be independent with his initial HEP to improve flexibility, strength and balance.     Time 6    Period Weeks    Status New    Target Date 11/30/19      PT SHORT TERM GOAL #2   Title perform 5x sit to stand in < or = to 25 seconds with UE support to improve balance    Time 6    Period Weeks    Status New      PT SHORT TERM GOAL #3   Title Timed up and Go time improved to 29 sec or less    Time 6    Period Weeks    Status New      PT SHORT TERM GOAL #4   Title The patient will be able to walk 200 feet with RW needed for improved short distance community mobility    Time 6    Period Weeks    Status New             PT Long Term Goals - 10/19/19 2112      PT LONG TERM GOAL #1   Title Pt will demo independence with his advanced HEP to futher promote strength gains after d/c from PT.    Time 12    Period Weeks    Status New      PT LONG TERM GOAL #2   Title Pt will demo improved functional strength  and power of the LEs evident by his ability to complete 5x/sit to stand in less than 22 sec with moderate UE use.    Time 12    Period Weeks    Status  New      PT LONG TERM GOAL #3   Title The patient will be able to walk for 3 minutes or 300 feet with RW needed for improved community mobility    Time 12    Period Weeks    Status New      PT LONG TERM GOAL #4   Title BERG balance score improved to 15/56    Time 12    Period Weeks    Status New      PT LONG TERM GOAL #5   Title Timed up and go improved to 22 sec indicating improved gait speed and safety    Time 12    Period Weeks    Status New                 Plan - 11/01/19 1147    Clinical Impression Statement Pt continues to report high levels of back and RT hip/leg pain. This pain does limit treatment at times. Pt did perfrom al exercises not as guarded and slow; most exercises focused more core, back strength and UE strength as we can manage his back and hip pain better. Post sit to stand and walking pain was now up to 8-9/10 so we decided to end treatment.    Personal Factors and Comorbidities Age;Fitness;Comorbidity 1;Comorbidity 2;Comorbidity 3+;Time since onset of injury/illness/exacerbation    Comorbidities COPD; peripheral neuropathy; renal failure; diabetes; heart disease;  chronic LBP    Examination-Activity Limitations Bathing;Locomotion Level;Transfers;Bend;Carry;Dressing;Lift;Stand;Hygiene/Grooming    Examination-Participation Restrictions Community Activity;Interpersonal Relationship;Other;Meal Prep    Stability/Clinical Decision Making Evolving/Moderate complexity    Rehab Potential Good    PT Frequency 2x / week    PT Duration 12 weeks    PT Treatment/Interventions ADLs/Self Care Home Management;Moist Heat;Neuromuscular re-education;Balance training;Therapeutic exercise;Therapeutic activities;Functional mobility training;Gait training;Patient/family education;Manual techniques;Dry needling    PT Next Visit Plan MMT for goals, sit to stand and TUG to see if pt made progress. Be aware of hip and back pain, pt does not like laying supine.    Consulted and  Agree with Plan of Care Patient           Patient will benefit from skilled therapeutic intervention in order to improve the following deficits and impairments:  Difficulty walking, Decreased endurance, Decreased activity tolerance, Impaired perceived functional ability, Pain, Decreased balance, Decreased mobility, Decreased strength, Postural dysfunction  Visit Diagnosis: Unsteadiness on feet  Muscle weakness (generalized)  Chronic low back pain, unspecified back pain laterality, unspecified whether sciatica present  Other abnormalities of gait and mobility     Problem List Patient Active Problem List   Diagnosis Date Noted  . Other chronic pain   . Intractable nausea and vomiting 01/19/2019  . Dehydration   . Intractable vomiting   . Hypokalemia   . GERD (gastroesophageal reflux disease) 11/07/2018  . History of CHF (congestive heart failure)   . Facial cellulitis   . Acute on chronic congestive heart failure ([place])   . Gait abnormality 11/02/2018  . Cellulitis 11/01/2018  . Paroxysmal atrial fibrillation ([place]) 08/22/2018  . High risk medication use 08/22/2018  . Chronic anticoagulation 08/22/2018  . On amiodarone therapy 08/22/2018  . Lumbar radiculopathy 05/05/2018  . Hypertensive heart disease 04/24/2018  . CKD (chronic kidney disease) 11/27/2015  .  Chronic diastolic heart failure ([place]) 11/27/2015  . Normocytic anemia 11/27/2015  . AKI (acute kidney injury) ([place])   . Acute renal failure superimposed on stage 4 chronic kidney disease ([place]) 10/02/2015  . Hypotension 10/02/2015  . Anorexia   . Adjustment disorder with mixed emotional features 09/01/2015  . Acute encephalopathy 08/29/2015  . Elevated troponin 08/29/2015  . Renal cancer ([place]) 08/28/2015  . Spinal stenosis of lumbar region with radiculopathy 08/07/2015  . COPD (chronic obstructive pulmonary disease) ([place])   . OSA (obstructive sleep apnea) 07/27/2015  . HNP (herniated nucleus pulposus), lumbar  07/24/2015  . Renal cell carcinoma ([place]) 07/23/2015  . Essential hypertension 07/12/2015  . Renal mass 07/12/2015  . PMR (polymyalgia rheumatica) ([place]) 09/09/2013  . Rheumatoid arthritis ([place]) 04/13/2012  . CAD (coronary artery disease) 12/19/2011  . Type 2 diabetes mellitus with other specified complication ([place]) 04/15/4386  . Neuropathy ([place]) 12/19/2011    Billie Intriago, PTA 11/01/2019, 12:21 PM  [facility] [address] [place], [place], [address] Phone: [phone]   Fax:  [phone]  Name: Jacob [name] MRN: [id number] Date of Birth: Nov 07, 1940

## 2019-11-03 ENCOUNTER — Telehealth: Payer: Self-pay | Admitting: Cardiology

## 2019-11-03 NOTE — Telephone Encounter (Signed)
Spoke to the patients wife just now and she let me know that the nephrologist called today letting them know that the patients potassium was low and they were going to send in potassium for the patient to take. They are also wanting him to alternate taking one 40 mg tablet of his furosemide on one day and alternate every other day taking 40 mg twice daily on the opposite days.  She wanted to verify with Dr. Bettina Gavia that this was ok with him as well. I will route this call to him for verification.

## 2019-11-03 NOTE — Telephone Encounter (Signed)
I would follow their advice

## 2019-11-03 NOTE — Telephone Encounter (Signed)
New message:    Patient wife calling concering her husband medication Furosemide 40 mg. She states the [place] would like for him to take a half, they would like for some one to call them concerning this matter. Patient will be gone by 12 pm. Husband can not take messages.

## 2019-11-03 NOTE — Telephone Encounter (Signed)
Spoke to the patients wife just now and let her know that per Dr. Bettina Gavia he stated that he would follow the nephrologists advise. She verbalizes understanding and thanks me for the call back.

## 2019-11-06 ENCOUNTER — Other Ambulatory Visit: Payer: Self-pay | Admitting: Cardiology

## 2019-11-07 ENCOUNTER — Ambulatory Visit: Payer: No Typology Code available for payment source | Admitting: Physical Therapy

## 2019-11-07 ENCOUNTER — Other Ambulatory Visit: Payer: Self-pay

## 2019-11-07 DIAGNOSIS — G8929 Other chronic pain: Secondary | ICD-10-CM

## 2019-11-07 DIAGNOSIS — M6281 Muscle weakness (generalized): Secondary | ICD-10-CM

## 2019-11-07 DIAGNOSIS — M545 Low back pain, unspecified: Secondary | ICD-10-CM

## 2019-11-07 DIAGNOSIS — R2681 Unsteadiness on feet: Secondary | ICD-10-CM | POA: Diagnosis not present

## 2019-11-07 DIAGNOSIS — R2689 Other abnormalities of gait and mobility: Secondary | ICD-10-CM

## 2019-11-07 NOTE — Therapy (Signed)
[facility] [address] [place], [place], [address] Phone: [phone]   Fax:  [phone]  Physical Therapy Treatment  Patient Details  Name: Jacob [name] MRN: [id number] Date of Birth: May 30, 1940 Referring Provider (PT): Dr. Elberta Spaniel    Encounter Date: 11/07/2019   PT End of Session - 11/07/19 1239    Visit Number 6    Date for PT Re-Evaluation 01/09/20    Authorization Type VA 15 visits 09/08/19-01/09/20    PT Start Time 1145    PT Stop Time 1225    PT Time Calculation (min) 40 min    Activity Tolerance Patient limited by fatigue;Patient limited by pain           Past Medical History:  Diagnosis Date  . Allergy   . Anemia   . Anxiety   . Arthritis   . Asthma   . BPH (benign prostatic hyperplasia)   . Cancer of kidney ([place])   . Cataract   . CHF (congestive heart failure) ([place])   . Chronic kidney disease    chronic  kidney failure  kidney function at 42%  . COPD (chronic obstructive pulmonary disease) ([place])   . Coronary artery disease    CABG  7 bypasses  . Diabetes mellitus without complication ([place])   . Facial cellulitis   . GERD (gastroesophageal reflux disease)   . Gout   . Hepatitis    many years ago  . Hyperlipidemia   . Hypertension   . Lumbar disc disease   . Obesity   . Paroxysmal atrial fibrillation (HCC)   . Peripheral neuropathy   . Polymyalgia rheumatica (HCC)    maintained on Prednisone, Plaquenil. Followed by rhuematology every 4 months/[name].  . Shortness of breath dyspnea    with exertion  . Sleep apnea    CPAP   Trying to use    Past Surgical History:  Procedure Laterality Date  . APPENDECTOMY    . BIOPSY  01/23/2019   Procedure: BIOPSY;  Surgeon: Otis Brace, MD;  Location: [facility];  Service: Gastroenterology;;  . [place] 2000   left  . CATARACT EXTRACTION W/PHACO Right 11/28/2014   Procedure: CATARACT EXTRACTION PHACO AND INTRAOCULAR  LENS PLACEMENT ([place]) RIGHT ;  Surgeon: Marylynn Pearson, MD;  Location: [facility];  Service: Ophthalmology;  Laterality: Right;  . CORONARY ARTERY BYPASS GRAFT  03/17/1995   Wynonia Lawman; followed every six months.  . ESOPHAGOGASTRODUODENOSCOPY (EGD) WITH PROPOFOL Left 01/23/2019   Procedure: ESOPHAGOGASTRODUODENOSCOPY (EGD) WITH PROPOFOL;  Surgeon: Otis Brace, MD;  [facility];  Service: Gastroenterology;  Laterality: Left;  . HERNIA REPAIR    . LUMBAR LAMINECTOMY/DECOMPRESSION MICRODISCECTOMY N/A 07/30/2015   Procedure: LUMBAR LAMINECTOMY DISCECTOMY ;  Surgeon: Ashok Pall, MD;  Location: [facility];  Service: Neurosurgery;  Laterality: N/A;  LUMBAR LAMINECTOMY DISCECTOMY   . LUMBAR LAMINECTOMY/DECOMPRESSION MICRODISCECTOMY N/A 09/06/2015   Procedure: Redo L3/4 Disectomy;  Surgeon: Ashok Pall, MD;  Location: [facility];  Service: Neurosurgery;  Laterality: N/A;  . PROSTATE SURGERY     TURP at [place].  Marland Kitchen TRANSURETHRAL RESECTION OF PROSTATE      There were no vitals filed for this visit.   Subjective Assessment - 11/07/19 1144    Subjective My legs are weak and want to give out today.  They wanted to give out while I was combing my hair.    My back always hurts.  Sitting is the best position.  That doesn't seem  to bother me.  Seeing the doctor this afternoon.    Pertinent History had HHPT 2 months ago after hospitalized 5 days related to kidney              [facility] PT Assessment - 11/07/19 0001      Standardized Balance Assessment   Five times sit to stand comments  needs UE assist 31.45 sec    15/20 perceived exertion level     Timed Up and Go Test   Normal TUG (seconds) 23.33    TUG Comments with RW                         [facility] Adult PT Treatment/Exercise - 11/07/19 0001      Therapeutic Activites    Therapeutic Activities ADL's    ADL's sit to stand, walking, standing, pulling, pushing      Lumbar Exercises: Seated   Hip Flexion on Ball Limitations hip  flexion with 5# weight on knee 10x each     Other Seated Lumbar Exercises blue band rows 20x, extensions  20x    Other Seated Lumbar Exercises blue band sit tall extensions 20x   some low back discomfort     Knee/Hip Exercises: Aerobic   Nustep seat 10 L1 10 minutes PT monitoring response                    PT Short Term Goals - 11/07/19 1206      PT SHORT TERM GOAL #3   Title Timed up and Go time improved to 29 sec or less    Status Achieved      PT SHORT TERM GOAL #4   Title The patient will be able to walk 200 feet with RW needed for improved short distance community mobility             PT Long Term Goals - 10/19/19 2112      PT LONG TERM GOAL #1   Title Pt will demo independence with his advanced HEP to futher promote strength gains after d/c from PT.    Time 12    Period Weeks    Status New      PT LONG TERM GOAL #2   Title Pt will demo improved functional strength and power of the LEs evident by his ability to complete 5x/sit to stand in less than 22 sec with moderate UE use.    Time 12    Period Weeks    Status New      PT LONG TERM GOAL #3   Title The patient will be able to walk for 3 minutes or 300 feet with RW needed for improved community mobility    Time 12    Period Weeks    Status New      PT LONG TERM GOAL #4   Title BERG balance score improved to 15/56    Time 12    Period Weeks    Status New      PT LONG TERM GOAL #5   Title Timed up and go improved to 22 sec indicating improved gait speed and safety    Time 12    Period Weeks    Status New                 Plan - 11/07/19 1240    Clinical Impression Statement The patient is limited by back and bil LE pain as well as general fatigue  and weakness.  Exercise performed in seated position only (position of comfort) although he does continue to have unchanging moderate back pain and LE pain.  His 5x sit to stand time has not improved although his Timed up and go score  significantly improved by 9 sec.  Slow progress anticipated secondary to numerous co-morbidities and chronicity of these factors.  Therapist closely monitoring response with all interventions and modifying in response.    Personal Factors and Comorbidities Age;Fitness;Comorbidity 1;Comorbidity 2;Comorbidity 3+;Time since onset of injury/illness/exacerbation    Comorbidities COPD; peripheral neuropathy; renal failure; diabetes; heart disease;  chronic LBP    Examination-Activity Limitations Bathing;Locomotion Level;Transfers;Bend;Carry;Dressing;Lift;Stand;Hygiene/Grooming    Examination-Participation Restrictions Community Activity;Interpersonal Relationship;Other;Meal Prep    Rehab Potential Good    PT Frequency 2x / week    PT Duration 12 weeks    PT Treatment/Interventions ADLs/Self Care Home Management;Moist Heat;Neuromuscular re-education;Balance training;Therapeutic exercise;Therapeutic activities;Functional mobility training;Gait training;Patient/family education;Manual techniques;Dry needling    PT Next Visit Plan low level exercise in sitting and limited standing ex;  Nu-Step           Patient will benefit from skilled therapeutic intervention in order to improve the following deficits and impairments:  Difficulty walking, Decreased endurance, Decreased activity tolerance, Impaired perceived functional ability, Pain, Decreased balance, Decreased mobility, Decreased strength, Postural dysfunction  Visit Diagnosis: Unsteadiness on feet  Muscle weakness (generalized)  Chronic low back pain, unspecified back pain laterality, unspecified whether sciatica present  Other abnormalities of gait and mobility     Problem List Patient Active Problem List   Diagnosis Date Noted  . Other chronic pain   . Intractable nausea and vomiting 01/19/2019  . Dehydration   . Intractable vomiting   . Hypokalemia   . GERD (gastroesophageal reflux disease) 11/07/2018  . History of CHF (congestive  heart failure)   . Facial cellulitis   . Acute on chronic congestive heart failure ([place])   . Gait abnormality 11/02/2018  . Cellulitis 11/01/2018  . Paroxysmal atrial fibrillation ([place]) 08/22/2018  . High risk medication use 08/22/2018  . Chronic anticoagulation 08/22/2018  . On amiodarone therapy 08/22/2018  . Lumbar radiculopathy 05/05/2018  . Hypertensive heart disease 04/24/2018  . CKD (chronic kidney disease) 11/27/2015  . Chronic diastolic heart failure ([place]) 11/27/2015  . Normocytic anemia 11/27/2015  . AKI (acute kidney injury) ([place])   . Acute renal failure superimposed on stage 4 chronic kidney disease ([place]) 10/02/2015  . Hypotension 10/02/2015  . Anorexia   . Adjustment disorder with mixed emotional features 09/01/2015  . Acute encephalopathy 08/29/2015  . Elevated troponin 08/29/2015  . Renal cancer ([place]) 08/28/2015  . Spinal stenosis of lumbar region with radiculopathy 08/07/2015  . COPD (chronic obstructive pulmonary disease) ([place])   . OSA (obstructive sleep apnea) 07/27/2015  . HNP (herniated nucleus pulposus), lumbar 07/24/2015  . Renal cell carcinoma ([place]) 07/23/2015  . Essential hypertension 07/12/2015  . Renal mass 07/12/2015  . PMR (polymyalgia rheumatica) ([place]) 09/09/2013  . Rheumatoid arthritis ([place]) 04/13/2012  . CAD (coronary artery disease) 12/19/2011  . Type 2 diabetes mellitus with other specified complication ([place]) 93/57/0177  . Neuropathy ([place]) 12/19/2011   Ruben Im, PT 11/07/19 1:58 PM Phone: [phone] Fax: [phone] Alvera Singh 11/07/2019, 1:58 PM  [facility] [address], [place], [place], [address] Phone: [phone]   Fax:  [phone]  Name: AMOS MICHEALS MRN: [id number] Date of Birth: 1941-02-29

## 2019-11-09 ENCOUNTER — Other Ambulatory Visit: Payer: Self-pay

## 2019-11-09 ENCOUNTER — Ambulatory Visit: Payer: No Typology Code available for payment source | Admitting: Physical Therapy

## 2019-11-09 DIAGNOSIS — R2681 Unsteadiness on feet: Secondary | ICD-10-CM

## 2019-11-09 DIAGNOSIS — M6281 Muscle weakness (generalized): Secondary | ICD-10-CM

## 2019-11-09 DIAGNOSIS — R2689 Other abnormalities of gait and mobility: Secondary | ICD-10-CM

## 2019-11-09 DIAGNOSIS — G8929 Other chronic pain: Secondary | ICD-10-CM

## 2019-11-09 DIAGNOSIS — M545 Low back pain, unspecified: Secondary | ICD-10-CM

## 2019-11-09 NOTE — Therapy (Signed)
[facility] [address], [place], [place], [address] Phone: [phone]   Fax:  [phone]  Physical Therapy Treatment  Patient Details  Name: Jacob [name] MRN: [id number] Date of Birth: 10/15/1940 Referring Provider (PT): Dr. Elberta Spaniel    Encounter Date: 11/09/2019   PT End of Session - 11/09/19 1229    Visit Number 7    Date for PT Re-Evaluation 01/09/20    Authorization Type VA 15 visits 09/08/19-01/09/20    PT Start Time 1145    PT Stop Time 1227    PT Time Calculation (min) 42 min    Activity Tolerance Patient limited by fatigue;Patient limited by pain           Past Medical History:  Diagnosis Date  . Allergy   . Anemia   . Anxiety   . Arthritis   . Asthma   . BPH (benign prostatic hyperplasia)   . Cancer of kidney ([place])   . Cataract   . CHF (congestive heart failure) ([place])   . Chronic kidney disease    chronic  kidney failure  kidney function at 42%  . COPD (chronic obstructive pulmonary disease) ([place])   . Coronary artery disease    CABG  7 bypasses  . Diabetes mellitus without complication ([place])   . Facial cellulitis   . GERD (gastroesophageal reflux disease)   . Gout   . Hepatitis    many years ago  . Hyperlipidemia   . Hypertension   . Lumbar disc disease   . Obesity   . Paroxysmal atrial fibrillation (HCC)   . Peripheral neuropathy   . Polymyalgia rheumatica (HCC)    maintained on Prednisone, Plaquenil. Followed by rhuematology every 4 months/[name].  . Shortness of breath dyspnea    with exertion  . Sleep apnea    CPAP   Trying to use    Past Surgical History:  Procedure Laterality Date  . APPENDECTOMY    . BIOPSY  01/23/2019   Procedure: BIOPSY;  Surgeon: Otis Brace, MD;  [facility];  Service: Gastroenterology;;  . [place] 2000   left  . CATARACT EXTRACTION W/PHACO Right 11/28/2014   Procedure: CATARACT EXTRACTION PHACO AND INTRAOCULAR  LENS PLACEMENT ([place]) RIGHT ;  Surgeon: Marylynn Pearson, MD;  Location: [facility];  Service: Ophthalmology;  Laterality: Right;  . CORONARY ARTERY BYPASS GRAFT  03/17/1995   Wynonia Lawman; followed every six months.  . ESOPHAGOGASTRODUODENOSCOPY (EGD) WITH PROPOFOL Left 01/23/2019   Procedure: ESOPHAGOGASTRODUODENOSCOPY (EGD) WITH PROPOFOL;  Surgeon: Otis Brace, MD;  [facility];  Service: Gastroenterology;  Laterality: Left;  . HERNIA REPAIR    . LUMBAR LAMINECTOMY/DECOMPRESSION MICRODISCECTOMY N/A 07/30/2015   Procedure: LUMBAR LAMINECTOMY DISCECTOMY ;  Surgeon: Ashok Pall, MD;  Location: [facility];  Service: Neurosurgery;  Laterality: N/A;  LUMBAR LAMINECTOMY DISCECTOMY   . LUMBAR LAMINECTOMY/DECOMPRESSION MICRODISCECTOMY N/A 09/06/2015   Procedure: Redo L3/4 Disectomy;  Surgeon: Ashok Pall, MD;  Location: [facility];  Service: Neurosurgery;  Laterality: N/A;  . PROSTATE SURGERY     TURP at [place].  Marland Kitchen TRANSURETHRAL RESECTION OF PROSTATE      There were no vitals filed for this visit.   Subjective Assessment - 11/09/19 1145    Subjective I'm doing fair.  My back is bothering me.  It never stops.  PT wasn't too bad last time but then I had to walk a long ways at the [place] and that wore me out.  My  knees hurt and my legs were just tired.    Pertinent History had HHPT 2 months ago after hospitalized 5 days related to kidney    How long can you stand comfortably? sits in chair in shower with wife's assist    How long can you walk comfortably? no where comfortably, need RW to walk apartment to car 100 feet    Currently in Pain? Yes    Pain Score 5     Pain Location Back    Pain Orientation Lower                             [facility] Adult PT Treatment/Exercise - 11/09/19 0001      Lumbar Exercises: Standing   Other Standing Lumbar Exercises 5# weight to knee and chest 5x right/left     Other Standing Lumbar Exercises step taps 5 inch 10x right/left       Lumbar  Exercises: Seated   Other Seated Lumbar Exercises blue band rows 20x, extensions  20x    Other Seated Lumbar Exercises 5# chops 10x each way       Knee/Hip Exercises: Aerobic   Nustep seat 10 L1 10 minutes PT monitoring response      Knee/Hip Exercises: Standing   Other Standing Knee Exercises side to side step taps 10x       Knee/Hip Exercises: Seated   Clamshell with TheraBand Blue   30x   Sit to Sand 5 reps   black foam                   PT Short Term Goals - 11/07/19 1206      PT SHORT TERM GOAL #3   Title Timed up and Go time improved to 29 sec or less    Status Achieved      PT SHORT TERM GOAL #4   Title The patient will be able to walk 200 feet with RW needed for improved short distance community mobility             PT Long Term Goals - 10/19/19 2112      PT LONG TERM GOAL #1   Title Pt will demo independence with his advanced HEP to futher promote strength gains after d/c from PT.    Time 12    Period Weeks    Status New      PT LONG TERM GOAL #2   Title Pt will demo improved functional strength and power of the LEs evident by his ability to complete 5x/sit to stand in less than 22 sec with moderate UE use.    Time 12    Period Weeks    Status New      PT LONG TERM GOAL #3   Title The patient will be able to walk for 3 minutes or 300 feet with RW needed for improved community mobility    Time 12    Period Weeks    Status New      PT LONG TERM GOAL #4   Title BERG balance score improved to 15/56    Time 12    Period Weeks    Status New      PT LONG TERM GOAL #5   Title Timed up and go improved to 22 sec indicating improved gait speed and safety    Time 12    Period Weeks    Status New  Plan - 11/09/19 1231    Clinical Impression Statement The patient complains of constant low back pain and leg pain but he is willing to perform a progression of strengthening despite the pain.  Sitting and standing ex's alternated  to allow for graded exposure of standing which always increases his pain.  Improved speed and agility with sit to stand.  He has some shortness of breath with wearing a mask but O2 saturation at 97%.  Therapist monitoring response continuously throughout session.    Personal Factors and Comorbidities Age;Fitness;Comorbidity 1;Comorbidity 2;Comorbidity 3+;Time since onset of injury/illness/exacerbation    Comorbidities COPD; peripheral neuropathy; renal failure; diabetes; heart disease;  chronic LBP    Examination-Activity Limitations Bathing;Locomotion Level;Transfers;Bend;Carry;Dressing;Lift;Stand;Hygiene/Grooming    Stability/Clinical Decision Making Evolving/Moderate complexity    Rehab Potential Good    PT Frequency 2x / week    PT Duration 12 weeks    PT Treatment/Interventions ADLs/Self Care Home Management;Moist Heat;Neuromuscular re-education;Balance training;Therapeutic exercise;Therapeutic activities;Functional mobility training;Gait training;Patient/family education;Manual techniques;Dry needling    PT Next Visit Plan low level exercise in sitting and limited standing ex;  Nu-Step           Patient will benefit from skilled therapeutic intervention in order to improve the following deficits and impairments:  Difficulty walking, Decreased endurance, Decreased activity tolerance, Impaired perceived functional ability, Pain, Decreased balance, Decreased mobility, Decreased strength, Postural dysfunction  Visit Diagnosis: Unsteadiness on feet  Muscle weakness (generalized)  Chronic low back pain, unspecified back pain laterality, unspecified whether sciatica present  Other abnormalities of gait and mobility     Problem List Patient Active Problem List   Diagnosis Date Noted  . Other chronic pain   . Intractable nausea and vomiting 01/19/2019  . Dehydration   . Intractable vomiting   . Hypokalemia   . GERD (gastroesophageal reflux disease) 11/07/2018  . History of CHF  (congestive heart failure)   . Facial cellulitis   . Acute on chronic congestive heart failure ([place])   . Gait abnormality 11/02/2018  . Cellulitis 11/01/2018  . Paroxysmal atrial fibrillation ([place]) 08/22/2018  . High risk medication use 08/22/2018  . Chronic anticoagulation 08/22/2018  . On amiodarone therapy 08/22/2018  . Lumbar radiculopathy 05/05/2018  . Hypertensive heart disease 04/24/2018  . CKD (chronic kidney disease) 11/27/2015  . Chronic diastolic heart failure ([place]) 11/27/2015  . Normocytic anemia 11/27/2015  . AKI (acute kidney injury) ([place])   . Acute renal failure superimposed on stage 4 chronic kidney disease ([place]) 10/02/2015  . Hypotension 10/02/2015  . Anorexia   . Adjustment disorder with mixed emotional features 09/01/2015  . Acute encephalopathy 08/29/2015  . Elevated troponin 08/29/2015  . Renal cancer ([place]) 08/28/2015  . Spinal stenosis of lumbar region with radiculopathy 08/07/2015  . COPD (chronic obstructive pulmonary disease) ([place])   . OSA (obstructive sleep apnea) 07/27/2015  . HNP (herniated nucleus pulposus), lumbar 07/24/2015  . Renal cell carcinoma ([place]) 07/23/2015  . Essential hypertension 07/12/2015  . Renal mass 07/12/2015  . PMR (polymyalgia rheumatica) ([facility] [employer] [facility]) 09/09/2013  . Rheumatoid arthritis ([place]) 04/13/2012  . CAD (coronary artery disease) 12/19/2011  . Type 2 diabetes mellitus with other specified complication ([place]) 56/21/3086  . Neuropathy ([place]) 12/19/2011   Ruben Im, PT 11/09/19 5:19 PM Phone: [phone] Fax: [phone] Alvera Singh 11/09/2019, 5:19 PM  [facility] [address] [place], [place], [address] Phone: [phone]   Fax:  [phone]  Name: KEATEN MASHEK MRN: [id number] Date of Birth: [birth year]

## 2019-11-14 ENCOUNTER — Other Ambulatory Visit: Payer: Self-pay

## 2019-11-14 ENCOUNTER — Ambulatory Visit: Payer: No Typology Code available for payment source | Admitting: Physical Therapy

## 2019-11-14 DIAGNOSIS — M6281 Muscle weakness (generalized): Secondary | ICD-10-CM

## 2019-11-14 DIAGNOSIS — R2681 Unsteadiness on feet: Secondary | ICD-10-CM

## 2019-11-14 DIAGNOSIS — M545 Low back pain, unspecified: Secondary | ICD-10-CM

## 2019-11-14 DIAGNOSIS — G8929 Other chronic pain: Secondary | ICD-10-CM

## 2019-11-14 DIAGNOSIS — R2689 Other abnormalities of gait and mobility: Secondary | ICD-10-CM

## 2019-11-14 NOTE — Therapy (Signed)
[facility] [address], [place], [place], [address] Phone: [phone]   Fax:  [phone]  Physical Therapy Treatment  Patient Details  Name: Jacob [name] MRN: [id number] Date of Birth: [birth year] Referring Provider (PT): Dr. Elberta Spaniel    Encounter Date: 11/14/2019   PT End of Session - 11/14/19 1229    Visit Number 8    Date for PT Re-Evaluation 01/09/20    Authorization Type VA 15 visits 09/08/19-01/09/20    PT Start Time 1145    PT Stop Time 1225    PT Time Calculation (min) 40 min    Activity Tolerance Patient limited by fatigue;Patient limited by pain           Past Medical History:  Diagnosis Date  . Allergy   . Anemia   . Anxiety   . Arthritis   . Asthma   . BPH (benign prostatic hyperplasia)   . Cancer of kidney ([place])   . Cataract   . CHF (congestive heart failure) ([place])   . Chronic kidney disease    chronic  kidney failure  kidney function at 42%  . COPD (chronic obstructive pulmonary disease) ([place])   . Coronary artery disease    CABG  7 bypasses  . Diabetes mellitus without complication ([place])   . Facial cellulitis   . GERD (gastroesophageal reflux disease)   . Gout   . Hepatitis    many years ago  . Hyperlipidemia   . Hypertension   . Lumbar disc disease   . Obesity   . Paroxysmal atrial fibrillation (HCC)   . Peripheral neuropathy   . Polymyalgia rheumatica (HCC)    maintained on Prednisone, Plaquenil. Followed by rhuematology every 4 months/[name].  . Shortness of breath dyspnea    with exertion  . Sleep apnea    CPAP   Trying to use    Past Surgical History:  Procedure Laterality Date  . APPENDECTOMY    . BIOPSY  01/23/2019   Procedure: BIOPSY;  Surgeon: Otis Brace, MD;  Location: [facility];  Service: Gastroenterology;;  . [place] 2000   left  . CATARACT EXTRACTION W/PHACO Right 11/28/2014   Procedure: CATARACT EXTRACTION PHACO AND INTRAOCULAR  LENS PLACEMENT ([place]) RIGHT ;  Surgeon: Marylynn Pearson, MD;  Location: [place];  Service: Ophthalmology;  Laterality: Right;  . CORONARY ARTERY BYPASS GRAFT  03/17/1995   Wynonia Lawman; followed every six months.  . ESOPHAGOGASTRODUODENOSCOPY (EGD) WITH PROPOFOL Left 01/23/2019   Procedure: ESOPHAGOGASTRODUODENOSCOPY (EGD) WITH PROPOFOL;  Surgeon: Otis Brace, MD;  [facility];  Service: Gastroenterology;  Laterality: Left;  . HERNIA REPAIR    . LUMBAR LAMINECTOMY/DECOMPRESSION MICRODISCECTOMY N/A 07/30/2015   Procedure: LUMBAR LAMINECTOMY DISCECTOMY ;  Surgeon: Ashok Pall, MD;  Location: [place] [facility];  Service: Neurosurgery;  Laterality: N/A;  LUMBAR LAMINECTOMY DISCECTOMY   . LUMBAR LAMINECTOMY/DECOMPRESSION MICRODISCECTOMY N/A 09/06/2015   Procedure: Redo L3/4 Disectomy;  Surgeon: Ashok Pall, MD;  Location: [place] [facility];  Service: Neurosurgery;  Laterality: N/A;  . PROSTATE SURGERY     TURP at [place].  Marland Kitchen TRANSURETHRAL RESECTION OF PROSTATE      There were no vitals filed for this visit.   Subjective Assessment - 11/14/19 1147    Subjective I fell on Sunday b/c my legs gave out walking 40-50 feet.  I was able to get myself up.  This morning my back pain was terrible.  It doesn'tfeel bad while I'm doing PT but 2 days  later I'm worse.    Pertinent History had HHPT 2 months ago after hospitalized 5 days related to kidney    How long can you walk comfortably? no where comfortably, need RW to walk apartment to car 100 feet    Patient Stated Goals walk better, get around better    Currently in Pain? Yes    Pain Score 6     Pain Location Back                             [facility] Adult PT Treatment/Exercise - 11/14/19 0001      Lumbar Exercises: Seated   Other Seated Lumbar Exercises 2# punches 12x forward and overhead     Other Seated Lumbar Exercises red band horizontal abduction 2x 10       Knee/Hip Exercises: Aerobic   Nustep seat 10 L1 6 minutes PT monitoring response       Knee/Hip Exercises: Standing   Other Standing Knee Exercises side to side weight shifts 30 sec    Other Standing Knee Exercises 30 sec front to back sways 10x      Knee/Hip Exercises: Seated   Long Arc Quad Strengthening;Right;Left;10 reps    Long Arc Quad Weight 4 lbs.    Clamshell with TheraBand Blue   30x   Other Seated Knee/Hip Exercises seated red band HS curls 8x left only   discontinued secondary to right LE pain   Marching Strengthening;Right;Left;10 reps    Marching Limitations 2# resting on thigh                     PT Short Term Goals - 11/07/19 1206      PT SHORT TERM GOAL #3   Title Timed up and Go time improved to 29 sec or less    Status Achieved      PT SHORT TERM GOAL #4   Title The patient will be able to walk 200 feet with RW needed for improved short distance community mobility             PT Long Term Goals - 10/19/19 2112      PT LONG TERM GOAL #1   Title Pt will demo independence with his advanced HEP to futher promote strength gains after d/c from PT.    Time 12    Period Weeks    Status New      PT LONG TERM GOAL #2   Title Pt will demo improved functional strength and power of the LEs evident by his ability to complete 5x/sit to stand in less than 22 sec with moderate UE use.    Time 12    Period Weeks    Status New      PT LONG TERM GOAL #3   Title The patient will be able to walk for 3 minutes or 300 feet with RW needed for improved community mobility    Time 12    Period Weeks    Status New      PT LONG TERM GOAL #4   Title BERG balance score improved to 15/56    Time 12    Period Weeks    Status New      PT LONG TERM GOAL #5   Title Timed up and go improved to 22 sec indicating improved gait speed and safety    Time 12    Period Weeks    Status New  Plan - 11/14/19 1232    Clinical Impression Statement Treatment modified extensively secondary to complaints of back and right LE pain as  well as general fatigue.  He had a fall recently with walking when his legs give out.  Despite modified ex's to include more sitting vs. standing he continues to complain of right LE pain throughout session.  He reports most of the time he feels better after PT but then when he goes home, he takes a nap and when he gets up he is worse.   Discussed weighing the benefits of strengthening/PT vs. what he is able to handle pain-wise.  Therapist closely monitoring response and modifying accordingly.    Personal Factors and Comorbidities Age;Fitness;Comorbidity 1;Comorbidity 2;Comorbidity 3+;Time since onset of injury/illness/exacerbation    Comorbidities COPD; peripheral neuropathy; renal failure; diabetes; heart disease;  chronic LBP    Rehab Potential Good    PT Frequency 2x / week    PT Duration 12 weeks    PT Treatment/Interventions ADLs/Self Care Home Management;Moist Heat;Neuromuscular re-education;Balance training;Therapeutic exercise;Therapeutic activities;Functional mobility training;Gait training;Patient/family education;Manual techniques;Dry needling    PT Next Visit Plan low level exercise in sitting and limited standing ex;  Nu-Step    Consulted and Agree with Plan of Care Patient           Patient will benefit from skilled therapeutic intervention in order to improve the following deficits and impairments:  Difficulty walking, Decreased endurance, Decreased activity tolerance, Impaired perceived functional ability, Pain, Decreased balance, Decreased mobility, Decreased strength, Postural dysfunction  Visit Diagnosis: Unsteadiness on feet  Muscle weakness (generalized)  Chronic low back pain, unspecified back pain laterality, unspecified whether sciatica present  Other abnormalities of gait and mobility     Problem List Patient Active Problem List   Diagnosis Date Noted  . Other chronic pain   . Intractable nausea and vomiting 01/19/2019  . Dehydration   . Intractable  vomiting   . Hypokalemia   . GERD (gastroesophageal reflux disease) 11/07/2018  . History of CHF (congestive heart failure)   . Facial cellulitis   . Acute on chronic congestive heart failure ([place])   . Gait abnormality 11/02/2018  . Cellulitis 11/01/2018  . Paroxysmal atrial fibrillation ([place]) 08/22/2018  . High risk medication use 08/22/2018  . Chronic anticoagulation 08/22/2018  . On amiodarone therapy 08/22/2018  . Lumbar radiculopathy 05/05/2018  . Hypertensive heart disease 04/24/2018  . CKD (chronic kidney disease) 11/27/2015  . Chronic diastolic heart failure ([place]) 11/27/2015  . Normocytic anemia 11/27/2015  . AKI (acute kidney injury) ([place])   . Acute renal failure superimposed on stage 4 chronic kidney disease ([place]) 10/02/2015  . Hypotension 10/02/2015  . Anorexia   . Adjustment disorder with mixed emotional features 09/01/2015  . Acute encephalopathy 08/29/2015  . Elevated troponin 08/29/2015  . Renal cancer ([place]) 08/28/2015  . Spinal stenosis of lumbar region with radiculopathy 08/07/2015  . COPD (chronic obstructive pulmonary disease) ([place])   . OSA (obstructive sleep apnea) 07/27/2015  . HNP (herniated nucleus pulposus), lumbar 07/24/2015  . Renal cell carcinoma ([place]) 07/23/2015  . Essential hypertension 07/12/2015  . Renal mass 07/12/2015  . PMR (polymyalgia rheumatica) ([place]) 09/09/2013  . Rheumatoid arthritis ([place]) 04/13/2012  . CAD (coronary artery disease) 12/19/2011  . Type 2 diabetes mellitus with other specified complication ([place]) 96/06/5407  . Neuropathy ([place]) 12/19/2011   Ruben Im, PT 11/14/19 6:58 PM Phone: [phone] Fax: [phone] Alvera Singh 11/14/2019, 6:58 PM  [facility] [address]  [address], [place], [place], [address] Phone: [phone]   Fax:  [phone]  Name: Jacob [name] MRN: [id number] Date of Birth: December 28, 1940

## 2019-11-16 ENCOUNTER — Other Ambulatory Visit: Payer: Self-pay

## 2019-11-16 ENCOUNTER — Ambulatory Visit: Payer: No Typology Code available for payment source | Attending: Internal Medicine | Admitting: Physical Therapy

## 2019-11-16 DIAGNOSIS — M6281 Muscle weakness (generalized): Secondary | ICD-10-CM | POA: Diagnosis not present

## 2019-11-16 DIAGNOSIS — G8929 Other chronic pain: Secondary | ICD-10-CM | POA: Insufficient documentation

## 2019-11-16 DIAGNOSIS — R2681 Unsteadiness on feet: Secondary | ICD-10-CM | POA: Insufficient documentation

## 2019-11-16 DIAGNOSIS — M545 Low back pain, unspecified: Secondary | ICD-10-CM

## 2019-11-16 DIAGNOSIS — R2689 Other abnormalities of gait and mobility: Secondary | ICD-10-CM | POA: Diagnosis not present

## 2019-11-16 NOTE — Therapy (Signed)
[facility] [address], [place], [place], [address] Phone: [phone]   Fax:  [phone]  Physical Therapy Treatment  Patient Details  Name: Jacob [name] MRN: [id number] Date of Birth: [birth year] Referring Provider (PT): Dr. Elberta Spaniel    Encounter Date: 11/16/2019   PT End of Session - 11/16/19 1201    Visit Number 9    Date for PT Re-Evaluation 01/09/20    Authorization Type VA 15 visits 09/08/19-01/09/20    PT Start Time 1093    PT Stop Time 1228    PT Time Calculation (min) 44 min    Activity Tolerance Patient limited by fatigue;Patient tolerated treatment well           Past Medical History:  Diagnosis Date  . Allergy   . Anemia   . Anxiety   . Arthritis   . Asthma   . BPH (benign prostatic hyperplasia)   . Cancer of kidney ([place])   . Cataract   . CHF (congestive heart failure) ([place])   . Chronic kidney disease    chronic  kidney failure  kidney function at 42%  . COPD (chronic obstructive pulmonary disease) ([place])   . Coronary artery disease    CABG  7 bypasses  . Diabetes mellitus without complication ([place])   . Facial cellulitis   . GERD (gastroesophageal reflux disease)   . Gout   . Hepatitis    many years ago  . Hyperlipidemia   . Hypertension   . Lumbar disc disease   . Obesity   . Paroxysmal atrial fibrillation (HCC)   . Peripheral neuropathy   . Polymyalgia rheumatica (HCC)    maintained on Prednisone, Plaquenil. Followed by rhuematology every 4 months/[name].  . Shortness of breath dyspnea    with exertion  . Sleep apnea    CPAP   Trying to use    Past Surgical History:  Procedure Laterality Date  . APPENDECTOMY    . BIOPSY  01/23/2019   Procedure: BIOPSY;  Surgeon: Otis Brace, MD;  [facility];  Service: Gastroenterology;;  . [place] 2000   left  . CATARACT EXTRACTION W/PHACO Right 11/28/2014   Procedure: CATARACT EXTRACTION PHACO AND  INTRAOCULAR LENS PLACEMENT ([place]) RIGHT ;  Surgeon: Marylynn Pearson, MD;  Location: [facility];  Service: Ophthalmology;  Laterality: Right;  . CORONARY ARTERY BYPASS GRAFT  03/17/1995   Wynonia Lawman; followed every six months.  . ESOPHAGOGASTRODUODENOSCOPY (EGD) WITH PROPOFOL Left 01/23/2019   Procedure: ESOPHAGOGASTRODUODENOSCOPY (EGD) WITH PROPOFOL;  Surgeon: Otis Brace, MD;  [facility];  Service: Gastroenterology;  Laterality: Left;  . HERNIA REPAIR    . LUMBAR LAMINECTOMY/DECOMPRESSION MICRODISCECTOMY N/A 07/30/2015   Procedure: LUMBAR LAMINECTOMY DISCECTOMY ;  Surgeon: Ashok Pall, MD;  Location: [place] [facility];  Service: Neurosurgery;  Laterality: N/A;  LUMBAR LAMINECTOMY DISCECTOMY   . LUMBAR LAMINECTOMY/DECOMPRESSION MICRODISCECTOMY N/A 09/06/2015   Procedure: Redo L3/4 Disectomy;  Surgeon: Ashok Pall, MD;  Location: [place] [facility];  Service: Neurosurgery;  Laterality: N/A;  . PROSTATE SURGERY     TURP at [place].  Marland Kitchen TRANSURETHRAL RESECTION OF PROSTATE      There were no vitals filed for this visit.   Subjective Assessment - 11/16/19 1150    Subjective I'm kind of foggy this morning.  I hoping to get my driver's license next week.    Pertinent History had HHPT 2 months ago after hospitalized 5 days related to kidney    How long can  you stand comfortably? sits in chair in shower with wife's assist    How long can you walk comfortably? no where comfortably, need RW to walk apartment to car 100 feet    Patient Stated Goals walk better, get around better    Currently in Pain? Yes    Pain Score 6     Pain Location Back                             [facility] Adult PT Treatment/Exercise - 11/16/19 0001      Lumbar Exercises: Seated   Sit to Stand Limitations 2# alternating bicep curls 10x     Other Seated Lumbar Exercises green band rows 20x    Other Seated Lumbar Exercises green band shoulder extensions 20x       Knee/Hip Exercises: Aerobic   Nustep seat 10 L2 10 minutes  PT monitoring response      Knee/Hip Exercises: Standing   Heel Raises Both;1 set;10 reps    Hip Abduction AROM;Right;Left;5 reps    Other Standing Knee Exercises side to side weight shifts 45 sec    Other Standing Knee Exercises 45 sec front to back sways 10x      Knee/Hip Exercises: Seated   Long Arc Quad Strengthening;Right;Left;15 reps    Long Arc Quad Weight 4 lbs.    Clamshell with TheraBand Blue   30x   Marching Strengthening;Right;Left;10 reps    Marching Limitations 2# resting on thigh     Sit to [employer] 5 reps   black foam                   PT Short Term Goals - 11/07/19 1206      PT SHORT TERM GOAL #3   Title Timed up and Go time improved to 29 sec or less    Status Achieved      PT SHORT TERM GOAL #4   Title The patient will be able to walk 200 feet with RW needed for improved short distance community mobility             PT Long Term Goals - 10/19/19 2112      PT LONG TERM GOAL #1   Title Pt will demo independence with his advanced HEP to futher promote strength gains after d/c from PT.    Time 12    Period Weeks    Status New      PT LONG TERM GOAL #2   Title Pt will demo improved functional strength and power of the LEs evident by his ability to complete 5x/sit to stand in less than 22 sec with moderate UE use.    Time 12    Period Weeks    Status New      PT LONG TERM GOAL #3   Title The patient will be able to walk for 3 minutes or 300 feet with RW needed for improved community mobility    Time 12    Period Weeks    Status New      PT LONG TERM GOAL #4   Title BERG balance score improved to 15/56    Time 12    Period Weeks    Status New      PT LONG TERM GOAL #5   Title Timed up and go improved to 22 sec indicating improved gait speed and safety    Time 12    Period Weeks  Status New                 Plan - 11/16/19 1212    Clinical Impression Statement The patient is able to increase repetition and/or resistance  intensity with most exercises although standing tolerance is still very limited.  He is only able to stand up to 90 sec before he is excessively leaning forward and his knees begin to bend.  He reports some fleeting "light headedness" during session requiring some extra rest breaks.  The patient is not as limited by back pain today during exercise compared to previous sessions.  Therapist monitoring response with all interventions and modifying accordingly.    Personal Factors and Comorbidities Age;Fitness;Comorbidity 1;Comorbidity 2;Comorbidity 3+;Time since onset of injury/illness/exacerbation    Comorbidities COPD; peripheral neuropathy; renal failure; diabetes; heart disease;  chronic LBP    Rehab Potential Good    PT Frequency 2x / week    PT Duration 12 weeks    PT Treatment/Interventions ADLs/Self Care Home Management;Moist Heat;Neuromuscular re-education;Balance training;Therapeutic exercise;Therapeutic activities;Functional mobility training;Gait training;Patient/family education;Manual techniques;Dry needling    PT Next Visit Plan 10th visit progress note next session; TUG; walking measurement;  Nu-Step L2; seated and standing general strengthening           Patient will benefit from skilled therapeutic intervention in order to improve the following deficits and impairments:  Difficulty walking, Decreased endurance, Decreased activity tolerance, Impaired perceived functional ability, Pain, Decreased balance, Decreased mobility, Decreased strength, Postural dysfunction  Visit Diagnosis: Unsteadiness on feet  Muscle weakness (generalized)  Chronic low back pain, unspecified back pain laterality, unspecified whether sciatica present  Other abnormalities of gait and mobility     Problem List Patient Active Problem List   Diagnosis Date Noted  . Other chronic pain   . Intractable nausea and vomiting 01/19/2019  . Dehydration   . Intractable vomiting   . Hypokalemia   . GERD  (gastroesophageal reflux disease) 11/07/2018  . History of CHF (congestive heart failure)   . Facial cellulitis   . Acute on chronic congestive heart failure ([place])   . Gait abnormality 11/02/2018  . Cellulitis 11/01/2018  . Paroxysmal atrial fibrillation ([place]) 08/22/2018  . High risk medication use 08/22/2018  . Chronic anticoagulation 08/22/2018  . On amiodarone therapy 08/22/2018  . Lumbar radiculopathy 05/05/2018  . Hypertensive heart disease 04/24/2018  . CKD (chronic kidney disease) 11/27/2015  . Chronic diastolic heart failure ([place]) 11/27/2015  . Normocytic anemia 11/27/2015  . AKI (acute kidney injury) ([place])   . Acute renal failure superimposed on stage 4 chronic kidney disease ([place]) 10/02/2015  . Hypotension 10/02/2015  . Anorexia   . Adjustment disorder with mixed emotional features 09/01/2015  . Acute encephalopathy 08/29/2015  . Elevated troponin 08/29/2015  . Renal cancer ([place]) 08/28/2015  . Spinal stenosis of lumbar region with radiculopathy 08/07/2015  . COPD (chronic obstructive pulmonary disease) ([place])   . OSA (obstructive sleep apnea) 07/27/2015  . HNP (herniated nucleus pulposus), lumbar 07/24/2015  . Renal cell carcinoma ([place]) 07/23/2015  . Essential hypertension 07/12/2015  . Renal mass 07/12/2015  . PMR (polymyalgia rheumatica) ([place]) 09/09/2013  . Rheumatoid arthritis ([place]) 04/13/2012  . CAD (coronary artery disease) 12/19/2011  . Type 2 diabetes mellitus with other specified complication ([place]) 01/74/9449  . Neuropathy ([place]) 12/19/2011   Ruben Im, PT 11/16/19 12:42 PM Phone: [phone] Fax: [phone] Alvera Singh 11/16/2019, 12:41 PM  [facility] [address], [place], [place], [address]  Phone: [phone]   Fax:  [phone]  Name: CORBETT MOULDER MRN: [id number] Date of Birth: 11/23/1940

## 2019-11-17 ENCOUNTER — Other Ambulatory Visit: Payer: Self-pay

## 2019-11-17 NOTE — Patient Outreach (Signed)
  [facility] Care Management Chronic Special Needs Program    11/17/2019  Name: Jacob [name], DOB: September 22, 1940  MRN: [id number]   [name] is enrolled in a chronic special needs plan for Heart Failure. Telephone call to client for CSNP assessment follow up. Unable to reach. HIPAA compliant voice message left with call back phone number and return call request.   PLAN:  RNCM will attempt 2nd telephone call to client in 2 weeks.   Quinn Plowman RN,BSN,CCM [facility] Care Management [phone]

## 2019-11-21 ENCOUNTER — Ambulatory Visit: Payer: No Typology Code available for payment source | Admitting: Physical Therapy

## 2019-11-21 ENCOUNTER — Other Ambulatory Visit: Payer: Self-pay

## 2019-11-21 DIAGNOSIS — M6281 Muscle weakness (generalized): Secondary | ICD-10-CM

## 2019-11-21 DIAGNOSIS — G8929 Other chronic pain: Secondary | ICD-10-CM

## 2019-11-21 DIAGNOSIS — R2689 Other abnormalities of gait and mobility: Secondary | ICD-10-CM

## 2019-11-21 DIAGNOSIS — R2681 Unsteadiness on feet: Secondary | ICD-10-CM | POA: Diagnosis not present

## 2019-11-21 DIAGNOSIS — M545 Low back pain, unspecified: Secondary | ICD-10-CM

## 2019-11-21 NOTE — Therapy (Signed)
[facility] [address], [place], [place], [address] Phone: [phone]   Fax:  [phone]  Physical Therapy Treatment  Patient Details  Name: Jacob [name] MRN: [id number] Date of Birth: 01-28-1941 Referring Provider (PT): Dr. Elberta Spaniel    Encounter Date: 11/21/2019   PT End of Session - 11/21/19 1225    Visit Number 10    Number of Visits 15    Date for PT Re-Evaluation 01/09/20    Authorization Type VA 15 visits 09/08/19-01/09/20    PT Start Time 7341    PT Stop Time 1220   treatment discontinued pt not feeling well   PT Time Calculation (min) 35 min    Activity Tolerance Patient limited by fatigue           Past Medical History:  Diagnosis Date  . Allergy   . Anemia   . Anxiety   . Arthritis   . Asthma   . BPH (benign prostatic hyperplasia)   . Cancer of kidney ([place])   . Cataract   . CHF (congestive heart failure) ([place])   . Chronic kidney disease    chronic  kidney failure  kidney function at 42%  . COPD (chronic obstructive pulmonary disease) ([place])   . Coronary artery disease    CABG  7 bypasses  . Diabetes mellitus without complication ([place])   . Facial cellulitis   . GERD (gastroesophageal reflux disease)   . Gout   . Hepatitis    many years ago  . Hyperlipidemia   . Hypertension   . Lumbar disc disease   . Obesity   . Paroxysmal atrial fibrillation (HCC)   . Peripheral neuropathy   . Polymyalgia rheumatica (HCC)    maintained on Prednisone, Plaquenil. Followed by rhuematology every 4 months/[name].  . Shortness of breath dyspnea    with exertion  . Sleep apnea    CPAP   Trying to use    Past Surgical History:  Procedure Laterality Date  . APPENDECTOMY    . BIOPSY  01/23/2019   Procedure: BIOPSY;  Surgeon: Otis Brace, MD;  [facility];  Service: Gastroenterology;;  . [place] 2000   left  . CATARACT EXTRACTION W/PHACO Right 11/28/2014    Procedure: CATARACT EXTRACTION PHACO AND INTRAOCULAR LENS PLACEMENT ([place]) RIGHT ;  Surgeon: Marylynn Pearson, MD;  Location: [facility];  Service: Ophthalmology;  Laterality: Right;  . CORONARY ARTERY BYPASS GRAFT  03/17/1995   Wynonia Lawman; followed every six months.  . ESOPHAGOGASTRODUODENOSCOPY (EGD) WITH PROPOFOL Left 01/23/2019   Procedure: ESOPHAGOGASTRODUODENOSCOPY (EGD) WITH PROPOFOL;  Surgeon: Otis Brace, MD;  [facility];  Service: Gastroenterology;  Laterality: Left;  . HERNIA REPAIR    . LUMBAR LAMINECTOMY/DECOMPRESSION MICRODISCECTOMY N/A 07/30/2015   Procedure: LUMBAR LAMINECTOMY DISCECTOMY ;  Surgeon: Ashok Pall, MD;  Location: [facility];  Service: Neurosurgery;  Laterality: N/A;  LUMBAR LAMINECTOMY DISCECTOMY   . LUMBAR LAMINECTOMY/DECOMPRESSION MICRODISCECTOMY N/A 09/06/2015   Procedure: Redo L3/4 Disectomy;  Surgeon: Ashok Pall, MD;  Location: [place];  Service: Neurosurgery;  Laterality: N/A;  . PROSTATE SURGERY     TURP at [place].  Marland Kitchen TRANSURETHRAL RESECTION OF PROSTATE      There were no vitals filed for this visit.       [facility] PT Assessment - 11/21/19 0001      Special Tests   Other special tests BP 127/75  HR 59       6 minute walk test  results    Aerobic Endurance Distance Walked 100   RW    Endurance additional comments 1 min 16 sec       Standardized Balance Assessment   Five times sit to stand comments  24.2 UE assist       Timed Up and Go Test   Normal TUG (seconds) 17.83    TUG Comments with RW                         [facility] Adult PT Treatment/Exercise - 11/21/19 0001      Ambulation/Gait   Gait Comments gait with RW 1:15 sec                     PT Short Term Goals - 11/21/19 1412      PT SHORT TERM GOAL #1   Title Pt will be independent with his initial HEP to improve flexibility, strength and balance.     Status Partially Met      PT SHORT TERM GOAL #2   Title perform 5x sit to stand in < or = to 25 seconds  with UE support to improve balance    Status Achieved      PT SHORT TERM GOAL #3   Title Timed up and Go time improved to 29 sec or less    Status Achieved      PT SHORT TERM GOAL #4   Title The patient will be able to walk 200 feet with RW needed for improved short distance community mobility    Time 6    Period Weeks    Status On-going             PT Long Term Goals - 10/19/19 2112      PT LONG TERM GOAL #1   Title Pt will demo independence with his advanced HEP to futher promote strength gains after d/c from PT.    Time 12    Period Weeks    Status New      PT LONG TERM GOAL #2   Title Pt will demo improved functional strength and power of the LEs evident by his ability to complete 5x/sit to stand in less than 22 sec with moderate UE use.    Time 12    Period Weeks    Status New      PT LONG TERM GOAL #3   Title The patient will be able to walk for 3 minutes or 300 feet with RW needed for improved community mobility    Time 12    Period Weeks    Status New      PT LONG TERM GOAL #4   Title BERG balance score improved to 15/56    Time 12    Period Weeks    Status New      PT LONG TERM GOAL #5   Title Timed up and go improved to 22 sec indicating improved gait speed and safety    Time 12    Period Weeks    Status New                 Plan - 11/21/19 1226    Clinical Impression Statement Patient arrives reporting feeling dizzy and "spots".  He is able to participate in functional retests including TUG, 5x sit to stand and walking timed test however he reports not feeling well.  BP and HR normal.  He ate 2 pieces of  candy (for possible low blood sugar)  however symptoms continue.  Treatment terminated and pt assisted to the car where his wife was advised of symptoms.  The patient has improved with TUG speed as well as 5x sit to stand test although walking tolerance is very limited.  His numerous co-morbidities have slowed his progress and may take longer to  show bigger functional improvements.  Partial STGs met.  He will continue to require close monitoring while exercising.    Personal Factors and Comorbidities Age;Fitness;Comorbidity 1;Comorbidity 2;Comorbidity 3+;Time since onset of injury/illness/exacerbation    Comorbidities COPD; peripheral neuropathy; renal failure; diabetes; heart disease;  chronic LBP    Examination-Participation Restrictions Community Activity;Interpersonal Relationship;Other;Meal Prep    Rehab Potential Good    PT Frequency 2x / week    PT Duration 12 weeks    PT Treatment/Interventions ADLs/Self Care Home Management;Moist Heat;Neuromuscular re-education;Balance training;Therapeutic exercise;Therapeutic activities;Functional mobility training;Gait training;Patient/family education;Manual techniques;Dry needling    PT Next Visit Plan Nu-Step L2; seated and standing general strengthening           Patient will benefit from skilled therapeutic intervention in order to improve the following deficits and impairments:  Difficulty walking, Decreased endurance, Decreased activity tolerance, Impaired perceived functional ability, Pain, Decreased balance, Decreased mobility, Decreased strength, Postural dysfunction  Visit Diagnosis: Unsteadiness on feet  Muscle weakness (generalized)  Chronic low back pain, unspecified back pain laterality, unspecified whether sciatica present  Other abnormalities of gait and mobility     Problem List Patient Active Problem List   Diagnosis Date Noted  . Other chronic pain   . Intractable nausea and vomiting 01/19/2019  . Dehydration   . Intractable vomiting   . Hypokalemia   . GERD (gastroesophageal reflux disease) 11/07/2018  . History of CHF (congestive heart failure)   . Facial cellulitis   . Acute on chronic congestive heart failure ([place])   . Gait abnormality 11/02/2018  . Cellulitis 11/01/2018  . Paroxysmal atrial fibrillation ([place]) 08/22/2018  . High risk medication  use 08/22/2018  . Chronic anticoagulation 08/22/2018  . On amiodarone therapy 08/22/2018  . Lumbar radiculopathy 05/05/2018  . Hypertensive heart disease 04/24/2018  . CKD (chronic kidney disease) 11/27/2015  . Chronic diastolic heart failure ([place]) 11/27/2015  . Normocytic anemia 11/27/2015  . AKI (acute kidney injury) ([place])   . Acute renal failure superimposed on stage 4 chronic kidney disease ([place]) 10/02/2015  . Hypotension 10/02/2015  . Anorexia   . Adjustment disorder with mixed emotional features 09/01/2015  . Acute encephalopathy 08/29/2015  . Elevated troponin 08/29/2015  . Renal cancer ([place]) 08/28/2015  . Spinal stenosis of lumbar region with radiculopathy 08/07/2015  . COPD (chronic obstructive pulmonary disease) ([place])   . OSA (obstructive sleep apnea) 07/27/2015  . HNP (herniated nucleus pulposus), lumbar 07/24/2015  . Renal cell carcinoma ([place]) 07/23/2015  . Essential hypertension 07/12/2015  . Renal mass 07/12/2015  . PMR (polymyalgia rheumatica) ([place]) 09/09/2013  . Rheumatoid arthritis ([place]) 04/13/2012  . CAD (coronary artery disease) 12/19/2011  . Type 2 diabetes mellitus with other specified complication ([place]) 48/18/5909  . Neuropathy ([place]) 12/19/2011   Ruben Im, PT 11/21/19 2:14 PM Phone: [phone] Fax: [phone] Alvera Singh 11/21/2019, 2:13 PM  [facility] [address] [place], [place], [address] Phone: [phone]   Fax:  [phone]  Name: LOYD MARHEFKA MRN: [id number] Date of Birth: 08/20/1940

## 2019-11-21 NOTE — Therapy (Signed)
[facility] [address], [place], [place], [address] Phone: [phone]   Fax:  [phone]  Physical Therapy Treatment  Patient Details  Name: Jacob [name] MRN: [id number] Date of Birth: 04-13-1940 Referring Provider (PT): Dr. Elberta Spaniel   Progress Note Reporting Period 10/19/2019 to 11/21/2019  See note below for Objective Data and Assessment of Progress/Goals.       Encounter Date: 11/21/2019   PT End of Session - 11/21/19 1225    Visit Number 10    Number of Visits 15    Date for PT Re-Evaluation 01/09/20    Authorization Type VA 15 visits 09/08/19-01/09/20    PT Start Time 3646    PT Stop Time 1220   treatment discontinued pt not feeling well   PT Time Calculation (min) 35 min    Activity Tolerance Patient limited by fatigue           Past Medical History:  Diagnosis Date  . Allergy   . Anemia   . Anxiety   . Arthritis   . Asthma   . BPH (benign prostatic hyperplasia)   . Cancer of kidney ([place])   . Cataract   . CHF (congestive heart failure) ([place])   . Chronic kidney disease    chronic  kidney failure  kidney function at 42%  . COPD (chronic obstructive pulmonary disease) ([place])   . Coronary artery disease    CABG  7 bypasses  . Diabetes mellitus without complication ([place])   . Facial cellulitis   . GERD (gastroesophageal reflux disease)   . Gout   . Hepatitis    many years ago  . Hyperlipidemia   . Hypertension   . Lumbar disc disease   . Obesity   . Paroxysmal atrial fibrillation (HCC)   . Peripheral neuropathy   . Polymyalgia rheumatica (HCC)    maintained on Prednisone, Plaquenil. Followed by rhuematology every 4 months/[name].  . Shortness of breath dyspnea    with exertion  . Sleep apnea    CPAP   Trying to use    Past Surgical History:  Procedure Laterality Date  . APPENDECTOMY    . BIOPSY  01/23/2019   Procedure: BIOPSY;  Surgeon: Otis Brace, MD;  [facility];   Service: Gastroenterology;;  . [place] 2000   left  . CATARACT EXTRACTION W/PHACO Right 11/28/2014   Procedure: CATARACT EXTRACTION PHACO AND INTRAOCULAR LENS PLACEMENT ([place]) RIGHT ;  Surgeon: Marylynn Pearson, MD;  Location: [facility];  Service: Ophthalmology;  Laterality: Right;  . CORONARY ARTERY BYPASS GRAFT  03/17/1995   Wynonia Lawman; followed every six months.  . ESOPHAGOGASTRODUODENOSCOPY (EGD) WITH PROPOFOL Left 01/23/2019   Procedure: ESOPHAGOGASTRODUODENOSCOPY (EGD) WITH PROPOFOL;  Surgeon: Otis Brace, MD;  [facility];  Service: Gastroenterology;  Laterality: Left;  . HERNIA REPAIR    . LUMBAR LAMINECTOMY/DECOMPRESSION MICRODISCECTOMY N/A 07/30/2015   Procedure: LUMBAR LAMINECTOMY DISCECTOMY ;  Surgeon: Ashok Pall, MD;  Location: [facility];  Service: Neurosurgery;  Laterality: N/A;  LUMBAR LAMINECTOMY DISCECTOMY   . LUMBAR LAMINECTOMY/DECOMPRESSION MICRODISCECTOMY N/A 09/06/2015   Procedure: Redo L3/4 Disectomy;  Surgeon: Ashok Pall, MD;  Location: [place];  Service: Neurosurgery;  Laterality: N/A;  . PROSTATE SURGERY     TURP at [place].  Marland Kitchen TRANSURETHRAL RESECTION OF PROSTATE      There were no vitals filed for this visit.   Subjective Assessment - 11/21/19 1709    Subjective I'm really dizzy today and  seeing spots. Pt staggers while getting hand sanitizer.    I ate 2 pieces of toast at 10:00.              [facility] PT Assessment - 11/21/19 0001      Special Tests   Other special tests BP 127/75  HR 59       6 minute walk test results    Aerobic Endurance Distance Walked 100   RW    Endurance additional comments 1 min 16 sec       Standardized Balance Assessment   Five times sit to stand comments  24.2 UE assist       Timed Up and Go Test   Normal TUG (seconds) 17.83    TUG Comments with RW                         [facility] Adult PT Treatment/Exercise - 11/21/19 0001      Ambulation/Gait   Gait Comments gait with RW 1:15  sec                     PT Short Term Goals - 11/21/19 1412      PT SHORT TERM GOAL #1   Title Pt will be independent with his initial HEP to improve flexibility, strength and balance.     Status Partially Met      PT SHORT TERM GOAL #2   Title perform 5x sit to stand in < or = to 25 seconds with UE support to improve balance    Status Achieved      PT SHORT TERM GOAL #3   Title Timed up and Go time improved to 29 sec or less    Status Achieved      PT SHORT TERM GOAL #4   Title The patient will be able to walk 200 feet with RW needed for improved short distance community mobility    Time 6    Period Weeks    Status On-going             PT Long Term Goals - 10/19/19 2112      PT LONG TERM GOAL #1   Title Pt will demo independence with his advanced HEP to futher promote strength gains after d/c from PT.    Time 12    Period Weeks    Status New      PT LONG TERM GOAL #2   Title Pt will demo improved functional strength and power of the LEs evident by his ability to complete 5x/sit to stand in less than 22 sec with moderate UE use.    Time 12    Period Weeks    Status New      PT LONG TERM GOAL #3   Title The patient will be able to walk for 3 minutes or 300 feet with RW needed for improved community mobility    Time 12    Period Weeks    Status New      PT LONG TERM GOAL #4   Title BERG balance score improved to 15/56    Time 12    Period Weeks    Status New      PT LONG TERM GOAL #5   Title Timed up and go improved to 22 sec indicating improved gait speed and safety    Time 12    Period Weeks    Status New  Plan - 11/21/19 1226    Clinical Impression Statement Patient arrives reporting feeling dizzy and "spots".  He is able to participate in functional retests including TUG, 5x sit to stand and walking timed test however he reports not feeling well.  BP and HR normal.  He ate 2 pieces of candy (for possible low blood  sugar)  however symptoms continue.  Treatment terminated and pt assisted to the car where his wife was advised of symptoms.  The patient has improved with TUG speed as well as 5x sit to stand test although walking tolerance is very limited.  His numerous co-morbidities have slowed his progress and may take longer to show bigger functional improvements.  Partial STGs met.  He will continue to require close monitoring while exercising.    Personal Factors and Comorbidities Age;Fitness;Comorbidity 1;Comorbidity 2;Comorbidity 3+;Time since onset of injury/illness/exacerbation    Comorbidities COPD; peripheral neuropathy; renal failure; diabetes; heart disease;  chronic LBP    Examination-Participation Restrictions Community Activity;Interpersonal Relationship;Other;Meal Prep    Rehab Potential Good    PT Frequency 2x / week    PT Duration 12 weeks    PT Treatment/Interventions ADLs/Self Care Home Management;Moist Heat;Neuromuscular re-education;Balance training;Therapeutic exercise;Therapeutic activities;Functional mobility training;Gait training;Patient/family education;Manual techniques;Dry needling    PT Next Visit Plan Nu-Step L2; seated and standing general strengthening           Patient will benefit from skilled therapeutic intervention in order to improve the following deficits and impairments:  Difficulty walking, Decreased endurance, Decreased activity tolerance, Impaired perceived functional ability, Pain, Decreased balance, Decreased mobility, Decreased strength, Postural dysfunction  Visit Diagnosis: Unsteadiness on feet  Muscle weakness (generalized)  Chronic low back pain, unspecified back pain laterality, unspecified whether sciatica present  Other abnormalities of gait and mobility     Problem List Patient Active Problem List   Diagnosis Date Noted  . Other chronic pain   . Intractable nausea and vomiting 01/19/2019  . Dehydration   . Intractable vomiting   .  Hypokalemia   . GERD (gastroesophageal reflux disease) 11/07/2018  . History of CHF (congestive heart failure)   . Facial cellulitis   . Acute on chronic congestive heart failure ([place])   . Gait abnormality 11/02/2018  . Cellulitis 11/01/2018  . Paroxysmal atrial fibrillation ([place]) 08/22/2018  . High risk medication use 08/22/2018  . Chronic anticoagulation 08/22/2018  . On amiodarone therapy 08/22/2018  . Lumbar radiculopathy 05/05/2018  . Hypertensive heart disease 04/24/2018  . CKD (chronic kidney disease) 11/27/2015  . Chronic diastolic heart failure ([place]) 11/27/2015  . Normocytic anemia 11/27/2015  . AKI (acute kidney injury) ([place])   . Acute renal failure superimposed on stage 4 chronic kidney disease ([place]) 10/02/2015  . Hypotension 10/02/2015  . Anorexia   . Adjustment disorder with mixed emotional features 09/01/2015  . Acute encephalopathy 08/29/2015  . Elevated troponin 08/29/2015  . Renal cancer ([place]) 08/28/2015  . Spinal stenosis of lumbar region with radiculopathy 08/07/2015  . COPD (chronic obstructive pulmonary disease) ([place])   . OSA (obstructive sleep apnea) 07/27/2015  . HNP (herniated nucleus pulposus), lumbar 07/24/2015  . Renal cell carcinoma ([place]) 07/23/2015  . Essential hypertension 07/12/2015  . Renal mass 07/12/2015  . PMR (polymyalgia rheumatica) ([place]) 09/09/2013  . Rheumatoid arthritis ([place]) 04/13/2012  . CAD (coronary artery disease) 12/19/2011  . Type 2 diabetes mellitus with other specified complication ([place]) 51/88/4166  . Neuropathy ([place]) 12/19/2011   Ruben Im, PT 11/21/19 5:11 PM Phone: [phone] Fax: [phone] Alvera Singh  11/21/2019, 5:10 PM  [facility] [address], [place], [place], [address] Phone: [phone]   Fax:  [phone]  Name: EDEL RIVERO MRN: [id number] Date of Birth: 1940/11/07

## 2019-11-22 ENCOUNTER — Other Ambulatory Visit: Payer: Self-pay

## 2019-11-22 NOTE — Patient Outreach (Signed)
  [facility]) Care Management Chronic Special Needs Program    11/22/2019  Name: Jacob [name], DOB: Dec 26, 1940  MRN: [id number]   [name] is enrolled in a chronic special needs plan for Heart Failure. Telephone call to client for CSNP assessment follow up. Wife/ designated party release, Amanuel [place] answered call.  HIPAA verified. Wife states client has an appointment today and therefore they will not be able to complete call with RNCM. Request call back at another time.   PLAN;  RNCM will attempt 3rd telephone outreach to client in 1 week.   Quinn Plowman RN,BSN,CCM [facility] Management [phone]

## 2019-11-23 ENCOUNTER — Other Ambulatory Visit: Payer: Self-pay

## 2019-11-23 ENCOUNTER — Ambulatory Visit: Payer: No Typology Code available for payment source | Admitting: Physical Therapy

## 2019-11-23 DIAGNOSIS — M545 Low back pain, unspecified: Secondary | ICD-10-CM

## 2019-11-23 DIAGNOSIS — G8929 Other chronic pain: Secondary | ICD-10-CM

## 2019-11-23 DIAGNOSIS — M6281 Muscle weakness (generalized): Secondary | ICD-10-CM

## 2019-11-23 DIAGNOSIS — R2681 Unsteadiness on feet: Secondary | ICD-10-CM

## 2019-11-23 DIAGNOSIS — R2689 Other abnormalities of gait and mobility: Secondary | ICD-10-CM

## 2019-11-23 NOTE — Patient Outreach (Signed)
[facility]) Care Management Chronic Special Needs Program  11/23/2019  Name: Jacob [name] DOB: [birth year]  MRN: [id number]  Mr. Ziyad Dyar is enrolled in a chronic special needs plan for Heart Failure.  Telephone call to client for CSNP assessment follow up. Unable to reach. HIPAA compliant voice message left with call bac phone number with return call request. Individualized care plan updated based on available data. Reviewed and updated care plan.    Goals Addressed            This Visit's Progress   . Client understands the importance of follow-up with providers by attending scheduled visits   On track    Primary care provider follow up ([place] doctor) 05/09/19 Cardiology follow up 05/23/19 and 02/28/19 Continue to follow up with your providers as recommended.     . Client will verbalize knowledge of diabetes self-management as evidenced by Hgb A1C <7 or as defined by provider.   On track    Your last documented Hgb A1c  was 6.2 completed on 11/07/18.  Have your Hgb Ac1 checked every 3-6 months if you are at goal or every 3 months if you are not at goal (or as recommended by your physician)  Continue  to follow diabetes self management actions:  Glucose monitoring per provider recommendations  Perform Quality checks on blood meter  Eat Healthy (Plan to eat low carbohydrate and low salt meals, Watch portion sizes and avoid sugar sweetened drinks.   Visit provider every 3-6 months as directed  Hbg A1C level every 3-6 months.     . Client will verbalize knowledge of self management of Hypertension as evidences by BP reading of 140/90 or less; or as defined by provider   On track    Mailed client education article:  High blood pressure in adults and low salt diet.  Continue to take your medications as prescribed. Follow up with your provider as recommended.  Continue to monitor your blood pressure and take results to your doctor appointments.     . Decrease  inpatient Heart Failure admissions/ readmissions with in the year   On track    Continue to take your medications as prescribed.  Review [employer] calendar sent in the mail for Heart failure information It is important to weigh daily and write down weights.  Pay attention to your body if you have any shortness of breath, swelling in you feet, ankles, legs or your waistband gets tight.  Call your provider if you gain 3 pounds overnight or gain 5 pounds in a week.  Follow a low salt meal plan, read food labels for the sodium (salt content)     . Decrease the use of hospital emergency department related to heart failure within the next year   On track    Continue to take your medications as prescribed.  Review [employer] calendar sent in the mail for Heart failure information It is important to weigh daily and write down weights.  Pay attention to your body if you have any shortness of breath, swelling in you feet, ankles, legs or your waistband gets tight.  Call your provider if you gain 3 pounds overnight or gain 5 pounds in a week.  Follow a low salt meal plan, read food labels for the sodium (salt content)    . Maintain timely refills of Heart Failure medication as prescribed within the year    On track    Continue to take your  medications as prescribed. Follow up with your doctor if you have questions Contact your assigned RN case manager if you have difficulty obtaining your medications    . Obtain annual  Lipid Profile, LDL-C   On track    RN case manager will send client education article: Low cholesterol, saturated fat, and trans fat diet.   RN case manager will send client education article: High Cholesterol    . Visit Primary Care Provider or Cardiologist at least 2 times per year   On track    Primary care provider visit completed 05/09/19 Cardiology visit completed 05/23/19 Continue to see your provider and cardiologist at least 2 times per year         Plan:  Send successful outreach letter with a copy of their individualized care plan, Send individual care plan to provider and Send educational material  Chronic care management coordinator will outreach in:  6 Months    Quinn Plowman RN,BSN,CCM [facility] [phone]    .

## 2019-11-23 NOTE — Therapy (Signed)
[facility] [address] [place], [place], [address] Phone: [phone]   Fax:  [phone]  Physical Therapy Treatment  Patient Details  Name: Jacob [name] MRN: [id number] Date of Birth: 20-Jan-1941 Referring Provider (PT): Dr. Elberta Spaniel    Encounter Date: 11/23/2019   PT End of Session - 11/23/19 1231    Visit Number 11    Number of Visits 15    Date for PT Re-Evaluation 01/09/20    Authorization Type VA 15 visits 09/08/19-01/09/20    PT Start Time 1143    PT Stop Time 1225    PT Time Calculation (min) 42 min    Activity Tolerance Patient tolerated treatment well           Past Medical History:  Diagnosis Date  . Allergy   . Anemia   . Anxiety   . Arthritis   . Asthma   . BPH (benign prostatic hyperplasia)   . Cancer of kidney ([place])   . Cataract   . CHF (congestive heart failure) ([place])   . Chronic kidney disease    chronic  kidney failure  kidney function at 42%  . COPD (chronic obstructive pulmonary disease) ([place])   . Coronary artery disease    CABG  7 bypasses  . Diabetes mellitus without complication ([place])   . Facial cellulitis   . GERD (gastroesophageal reflux disease)   . Gout   . Hepatitis    many years ago  . Hyperlipidemia   . Hypertension   . Lumbar disc disease   . Obesity   . Paroxysmal atrial fibrillation (HCC)   . Peripheral neuropathy   . Polymyalgia rheumatica (HCC)    maintained on Prednisone, Plaquenil. Followed by rhuematology every 4 months/[name].  . Shortness of breath dyspnea    with exertion  . Sleep apnea    CPAP   Trying to use    Past Surgical History:  Procedure Laterality Date  . APPENDECTOMY    . BIOPSY  01/23/2019   Procedure: BIOPSY;  Surgeon: Otis Brace, MD;  Location: [facility];  Service: Gastroenterology;;  . [place] 2000   left  . CATARACT EXTRACTION W/PHACO Right 11/28/2014   Procedure: CATARACT EXTRACTION PHACO AND  INTRAOCULAR LENS PLACEMENT ([place]) RIGHT ;  Surgeon: Marylynn Pearson, MD;  Location: [place];  Service: Ophthalmology;  Laterality: Right;  . CORONARY ARTERY BYPASS GRAFT  03/17/1995   Wynonia Lawman; followed every six months.  . ESOPHAGOGASTRODUODENOSCOPY (EGD) WITH PROPOFOL Left 01/23/2019   Procedure: ESOPHAGOGASTRODUODENOSCOPY (EGD) WITH PROPOFOL;  Surgeon: Otis Brace, MD;  [facility];  Service: Gastroenterology;  Laterality: Left;  . HERNIA REPAIR    . LUMBAR LAMINECTOMY/DECOMPRESSION MICRODISCECTOMY N/A 07/30/2015   Procedure: LUMBAR LAMINECTOMY DISCECTOMY ;  Surgeon: Ashok Pall, MD;  Location: [place] [facility];  Service: Neurosurgery;  Laterality: N/A;  LUMBAR LAMINECTOMY DISCECTOMY   . LUMBAR LAMINECTOMY/DECOMPRESSION MICRODISCECTOMY N/A 09/06/2015   Procedure: Redo L3/4 Disectomy;  Surgeon: Ashok Pall, MD;  Location: [place] [facility];  Service: Neurosurgery;  Laterality: N/A;  . PROSTATE SURGERY     TURP at [place].  Marland Kitchen TRANSURETHRAL RESECTION OF PROSTATE      There were no vitals filed for this visit.   Subjective Assessment - 11/23/19 1147    Subjective My blood sugar was low the other day.  Even after I ate the 2 pieces of candy here, my blood sugar was only 80 when I got home.  I think coming off the  prednisone has lowered my numbers.  My back is the same it doesn't change much.  I failed renewing my driving test b/c of my vision.    Pertinent History had HHPT 2 months ago after hospitalized 5 days related to kidney    How long can you stand comfortably? sits in chair in shower with wife's assist    How long can you walk comfortably? no where comfortably, need RW to walk apartment to car 100 feet    Currently in Pain? Yes    Pain Score 6     Pain Location Back                             [facility] Adult PT Treatment/Exercise - 11/23/19 0001      Lumbar Exercises: Standing   Other Standing Lumbar Exercises step taps 5 inch 10x right/left       Lumbar Exercises: Seated    Sit to Stand Limitations red band punches forward and to the side 10x each     Other Seated Lumbar Exercises green band rows 20x    Other Seated Lumbar Exercises green band shoulder extensions, Ws Ts 10x each       Knee/Hip Exercises: Aerobic   Nustep seat 10 L2 10 minutes PT monitoring response   61 spm     Knee/Hip Exercises: Standing   Heel Raises Both;1 set;10 reps    Hip Abduction AROM;Right;Left;10 reps    Other Standing Knee Exercises 30 sec front to back sways 10x      Knee/Hip Exercises: Seated   Clamshell with TheraBand Blue   30x   Marching Strengthening;Right;Left;10 reps    Marching Limitations 3# resting on thigh     Sit to Sand 5 reps                    PT Short Term Goals - 11/21/19 1412      PT SHORT TERM GOAL #1   Title Pt will be independent with his initial HEP to improve flexibility, strength and balance.     Status Partially Met      PT SHORT TERM GOAL #2   Title perform 5x sit to stand in < or = to 25 seconds with UE support to improve balance    Status Achieved      PT SHORT TERM GOAL #3   Title Timed up and Go time improved to 29 sec or less    Status Achieved      PT SHORT TERM GOAL #4   Title The patient will be able to walk 200 feet with RW needed for improved short distance community mobility    Time 6    Period Weeks    Status On-going             PT Long Term Goals - 10/19/19 2112      PT LONG TERM GOAL #1   Title Pt will demo independence with his advanced HEP to futher promote strength gains after d/c from PT.    Time 12    Period Weeks    Status New      PT LONG TERM GOAL #2   Title Pt will demo improved functional strength and power of the LEs evident by his ability to complete 5x/sit to stand in less than 22 sec with moderate UE use.    Time 12    Period Weeks    Status New  PT LONG TERM GOAL #3   Title The patient will be able to walk for 3 minutes or 300 feet with RW needed for improved community  mobility    Time 12    Period Weeks    Status New      PT LONG TERM GOAL #4   Title BERG balance score improved to 15/56    Time 12    Period Weeks    Status New      PT LONG TERM GOAL #5   Title Timed up and go improved to 22 sec indicating improved gait speed and safety    Time 12    Period Weeks    Status New                 Plan - 11/23/19 1204    Clinical Impression Statement Patient had adjusted his insulin dosage and is prepared with juice today after low blood sugar episode last visit.  He had much improved exercise tolerance today and was able to stand about 2 minutes before requiring a seated rest break.  With fatigue he progressively bends forward at the trunk.  Therapist closely monitoring response and providing close supervision and at times CGA for safety especially as he fatigues near the end of the treatment session.  Progression has been slower secondary to numerous co-morbidities.    Comorbidities COPD; peripheral neuropathy; renal failure; diabetes; heart disease;  chronic LBP    Examination-Activity Limitations Bathing;Locomotion Level;Transfers;Bend;Carry;Dressing;Lift;Stand;Hygiene/Grooming    Examination-Participation Restrictions Community Activity;Interpersonal Relationship;Other;Meal Prep    Stability/Clinical Decision Making Evolving/Moderate complexity    Rehab Potential Good    PT Frequency 2x / week    PT Duration 12 weeks    PT Treatment/Interventions ADLs/Self Care Home Management;Moist Heat;Neuromuscular re-education;Balance training;Therapeutic exercise;Therapeutic activities;Functional mobility training;Gait training;Patient/family education;Manual techniques;Dry needling    PT Next Visit Plan Nu-Step L2; seated and standing general strengthening low level;  4 more visits under current VA authorization           Patient will benefit from skilled therapeutic intervention in order to improve the following deficits and impairments:  Difficulty  walking, Decreased endurance, Decreased activity tolerance, Impaired perceived functional ability, Pain, Decreased balance, Decreased mobility, Decreased strength, Postural dysfunction  Visit Diagnosis: Unsteadiness on feet  Muscle weakness (generalized)  Chronic low back pain, unspecified back pain laterality, unspecified whether sciatica present  Other abnormalities of gait and mobility     Problem List Patient Active Problem List   Diagnosis Date Noted  . Other chronic pain   . Intractable nausea and vomiting 01/19/2019  . Dehydration   . Intractable vomiting   . Hypokalemia   . GERD (gastroesophageal reflux disease) 11/07/2018  . History of CHF (congestive heart failure)   . Facial cellulitis   . Acute on chronic congestive heart failure ([place])   . Gait abnormality 11/02/2018  . Cellulitis 11/01/2018  . Paroxysmal atrial fibrillation ([place]) 08/22/2018  . High risk medication use 08/22/2018  . Chronic anticoagulation 08/22/2018  . On amiodarone therapy 08/22/2018  . Lumbar radiculopathy 05/05/2018  . Hypertensive heart disease 04/24/2018  . CKD (chronic kidney disease) 11/27/2015  . Chronic diastolic heart failure ([place]) 11/27/2015  . Normocytic anemia 11/27/2015  . AKI (acute kidney injury) ([place])   . Acute renal failure superimposed on stage 4 chronic kidney disease ([place]) 10/02/2015  . Hypotension 10/02/2015  . Anorexia   . Adjustment disorder with mixed emotional features 09/01/2015  . Acute encephalopathy 08/29/2015  . Elevated troponin 08/29/2015  .  Renal cancer ([place]) 08/28/2015  . Spinal stenosis of lumbar region with radiculopathy 08/07/2015  . COPD (chronic obstructive pulmonary disease) ([place])   . OSA (obstructive sleep apnea) 07/27/2015  . HNP (herniated nucleus pulposus), lumbar 07/24/2015  . Renal cell carcinoma ([place]) 07/23/2015  . Essential hypertension 07/12/2015  . Renal mass 07/12/2015  . PMR (polymyalgia rheumatica) ([place]) 09/09/2013  . Rheumatoid  arthritis ([place]) 04/13/2012  . CAD (coronary artery disease) 12/19/2011  . Type 2 diabetes mellitus with other specified complication ([place]) 66/08/43  . Neuropathy ([place]) 12/19/2011   Ruben Im, PT 11/23/19 5:40 PM Phone: [phone] Fax: [phone] Alvera Singh 11/23/2019, 5:40 PM  [facility] [address] [place], [place], [address] Phone: [phone]   Fax:  [phone]  Name: OJANI BERENSON MRN: [id number] Date of Birth: Jul 23, 1940

## 2019-11-24 ENCOUNTER — Ambulatory Visit: Payer: Self-pay

## 2019-11-28 ENCOUNTER — Ambulatory Visit: Payer: HMO | Admitting: Cardiology

## 2019-11-30 ENCOUNTER — Telehealth: Payer: Self-pay | Admitting: Cardiology

## 2019-11-30 NOTE — Telephone Encounter (Signed)
m °

## 2019-12-01 NOTE — Telephone Encounter (Signed)
Called spoke to patients daughter, she is not on dpr form but she will go Monday to [facility] office to get that filled out.   She wants to know if Dr. Bettina Gavia will allow the patient to come off of cholesterol medicine for 6 months to 1 year. The patient is having trouble falling and has very little muscle strength in his legs. His neurologists states this could be from cholesterol medicine and wants to know if he can come off for a little while. Will consult with Dr. Bettina Gavia.

## 2019-12-01 NOTE — Telephone Encounter (Signed)
Called spoke to patient. He gave verbal permission for me to talk to daughter Hilda Blades. Informed Hilda Blades Dr.Munley is ok with the patient stopping cholesterol medicine. She will ask more specifics at the patients next visit. No further questions.

## 2019-12-01 NOTE — Telephone Encounter (Signed)
Left message for Jacob [name] to return call.

## 2019-12-01 NOTE — Telephone Encounter (Signed)
Follow up:      PATIENT DAUGHTER RETURNING YOUR CALL BACK.

## 2019-12-01 NOTE — Telephone Encounter (Signed)
Yes

## 2019-12-01 NOTE — Telephone Encounter (Signed)
Please call and see if you can get an understanding of what this is about

## 2019-12-04 ENCOUNTER — Other Ambulatory Visit: Payer: Self-pay

## 2019-12-04 ENCOUNTER — Encounter: Payer: Self-pay | Admitting: Physical Therapy

## 2019-12-04 ENCOUNTER — Ambulatory Visit: Payer: No Typology Code available for payment source | Admitting: Physical Therapy

## 2019-12-04 DIAGNOSIS — G8929 Other chronic pain: Secondary | ICD-10-CM

## 2019-12-04 DIAGNOSIS — R2681 Unsteadiness on feet: Secondary | ICD-10-CM | POA: Diagnosis not present

## 2019-12-04 DIAGNOSIS — R2689 Other abnormalities of gait and mobility: Secondary | ICD-10-CM

## 2019-12-04 DIAGNOSIS — M6281 Muscle weakness (generalized): Secondary | ICD-10-CM

## 2019-12-04 DIAGNOSIS — M545 Low back pain, unspecified: Secondary | ICD-10-CM

## 2019-12-04 NOTE — Therapy (Signed)
[facility] [address], [place], [place], [address] Phone: [phone]   Fax:  [phone]  Physical Therapy Treatment  Patient Details  Name: Jacob [name] MRN: [id number] Date of Birth: 1940-08-20 Referring Provider (PT): Dr. Elberta Spaniel    Encounter Date: 12/04/2019   PT End of Session - 12/04/19 1234    Visit Number 12    Number of Visits 15    Date for PT Re-Evaluation 01/09/20    Authorization Type VA 15 visits 09/08/19-01/09/20    PT Start Time 1231    PT Stop Time 1308    PT Time Calculation (min) 37 min    Activity Tolerance Patient limited by fatigue;Patient limited by pain    Behavior During Therapy [facility] for tasks assessed/performed           Past Medical History:  Diagnosis Date  . Allergy   . Anemia   . Anxiety   . Arthritis   . Asthma   . BPH (benign prostatic hyperplasia)   . Cancer of kidney ([place])   . Cataract   . CHF (congestive heart failure) ([place])   . Chronic kidney disease    chronic  kidney failure  kidney function at 42%  . COPD (chronic obstructive pulmonary disease) ([place])   . Coronary artery disease    CABG  7 bypasses  . Diabetes mellitus without complication ([place])   . Facial cellulitis   . GERD (gastroesophageal reflux disease)   . Gout   . Hepatitis    many years ago  . Hyperlipidemia   . Hypertension   . Lumbar disc disease   . Obesity   . Paroxysmal atrial fibrillation (HCC)   . Peripheral neuropathy   . Polymyalgia rheumatica (HCC)    maintained on Prednisone, Plaquenil. Followed by rhuematology every 4 months/[name].  . Shortness of breath dyspnea    with exertion  . Sleep apnea    CPAP   Trying to use    Past Surgical History:  Procedure Laterality Date  . APPENDECTOMY    . BIOPSY  01/23/2019   Procedure: BIOPSY;  Surgeon: Otis Brace, MD;  [facility];  Service: Gastroenterology;;  . [place] 2000   left  . CATARACT  EXTRACTION W/PHACO Right 11/28/2014   Procedure: CATARACT EXTRACTION PHACO AND INTRAOCULAR LENS PLACEMENT ([place]) RIGHT ;  Surgeon: Marylynn Pearson, MD;  Location: [facility];  Service: Ophthalmology;  Laterality: Right;  . CORONARY ARTERY BYPASS GRAFT  03/17/1995   Wynonia Lawman; followed every six months.  . ESOPHAGOGASTRODUODENOSCOPY (EGD) WITH PROPOFOL Left 01/23/2019   Procedure: ESOPHAGOGASTRODUODENOSCOPY (EGD) WITH PROPOFOL;  Surgeon: Otis Brace, MD;  [facility];  Service: Gastroenterology;  Laterality: Left;  . HERNIA REPAIR    . LUMBAR LAMINECTOMY/DECOMPRESSION MICRODISCECTOMY N/A 07/30/2015   Procedure: LUMBAR LAMINECTOMY DISCECTOMY ;  Surgeon: Ashok Pall, MD;  Location: [facility];  Service: Neurosurgery;  Laterality: N/A;  LUMBAR LAMINECTOMY DISCECTOMY   . LUMBAR LAMINECTOMY/DECOMPRESSION MICRODISCECTOMY N/A 09/06/2015   Procedure: Redo L3/4 Disectomy;  Surgeon: Ashok Pall, MD;  Location: [facility];  Service: Neurosurgery;  Laterality: N/A;  . PROSTATE SURGERY     TURP at [place].  Marland Kitchen TRANSURETHRAL RESECTION OF PROSTATE      There were no vitals filed for this visit.   Subjective Assessment - 12/04/19 1235    Subjective I had a pretty good day end of last week but then over the weekend "it all went down hill." i feel  wore out and my legs feel 'strangely weak."    Pertinent History had HHPT 2 months ago after hospitalized 5 days related to kidney    Currently in Pain? Yes    Pain Score 6     Pain Location Back    Pain Orientation Lower    Pain Descriptors / Indicators Sore    Multiple Pain Sites No                             [facility] Adult PT Treatment/Exercise - 12/04/19 0001      Lumbar Exercises: Seated   Sit to Stand Limitations red band punches forward and to the side 15x each     Other Seated Lumbar Exercises green band rows 25x      Knee/Hip Exercises: Aerobic   Nustep L2 x 10 min with discussion of status      Knee/Hip Exercises: Standing    Heel Raises Both;1 set;10 reps    Hip Abduction AROM;Right;Left;10 reps    Other Standing Knee Exercises 30 sec front to back sways 10x      Knee/Hip Exercises: Seated   Clamshell with TheraBand Blue   30x   Marching Strengthening;Right;Left;10 reps    Marching Limitations 3# resting on thigh     Abd/Adduction Limitations Ankle seated rocker board x 4 min                     PT Short Term Goals - 11/21/19 1412      PT SHORT TERM GOAL #1   Title Pt will be independent with his initial HEP to improve flexibility, strength and balance.     Status Partially Met      PT SHORT TERM GOAL #2   Title perform 5x sit to stand in < or = to 25 seconds with UE support to improve balance    Status Achieved      PT SHORT TERM GOAL #3   Title Timed up and Go time improved to 29 sec or less    Status Achieved      PT SHORT TERM GOAL #4   Title The patient will be able to walk 200 feet with RW needed for improved short distance community mobility    Time 6    Period Weeks    Status On-going             PT Long Term Goals - 10/19/19 2112      PT LONG TERM GOAL #1   Title Pt will demo independence with his advanced HEP to futher promote strength gains after d/c from PT.    Time 12    Period Weeks    Status New      PT LONG TERM GOAL #2   Title Pt will demo improved functional strength and power of the LEs evident by his ability to complete 5x/sit to stand in less than 22 sec with moderate UE use.    Time 12    Period Weeks    Status New      PT LONG TERM GOAL #3   Title The patient will be able to walk for 3 minutes or 300 feet with RW needed for improved community mobility    Time 12    Period Weeks    Status New      PT LONG TERM GOAL #4   Title BERG balance score improved to 15/56      Time 12    Period Weeks    Status New      PT LONG TERM GOAL #5   Title Timed up and go improved to 22 sec indicating improved gait speed and safety    Time 12    Period Weeks     Status New                 Plan - 12/04/19 1234    Clinical Impression Statement Pt reports Friday was a very good day where hewas able to walk with a cane for most of the day and felt his posture was decent. This did not last for the rest of the weekend as pain returned. He reports not slepeing well last night due to pain. Pt definitely showed signs of fatigue throughout the session. Pt was pretty much able to maintain the same of work he did at his last session with a few increases in reps.    Personal Factors and Comorbidities Age;Fitness;Comorbidity 1;Comorbidity 2;Comorbidity 3+;Time since onset of injury/illness/exacerbation    Comorbidities COPD; peripheral neuropathy; renal failure; diabetes; heart disease;  chronic LBP    Examination-Activity Limitations Bathing;Locomotion Level;Transfers;Bend;Carry;Dressing;Lift;Stand;Hygiene/Grooming    Examination-Participation Restrictions Community Activity;Interpersonal Relationship;Other;Meal Prep    Stability/Clinical Decision Making Evolving/Moderate complexity    Rehab Potential Good    PT Frequency 2x / week    PT Duration 12 weeks    PT Treatment/Interventions ADLs/Self Care Home Management;Moist Heat;Neuromuscular re-education;Balance training;Therapeutic exercise;Therapeutic activities;Functional mobility training;Gait training;Patient/family education;Manual techniques;Dry needling    PT Next Visit Plan Nu-Step L2; seated and standing general strengthening low level;  3  more visits under current VA authorization    Consulted and Agree with Plan of Care Patient           Patient will benefit from skilled therapeutic intervention in order to improve the following deficits and impairments:  Difficulty walking, Decreased endurance, Decreased activity tolerance, Impaired perceived functional ability, Pain, Decreased balance, Decreased mobility, Decreased strength, Postural dysfunction  Visit Diagnosis: Unsteadiness on  feet  Muscle weakness (generalized)  Chronic low back pain, unspecified back pain laterality, unspecified whether sciatica present  Other abnormalities of gait and mobility     Problem List Patient Active Problem List   Diagnosis Date Noted  . Other chronic pain   . Intractable nausea and vomiting 01/19/2019  . Dehydration   . Intractable vomiting   . Hypokalemia   . GERD (gastroesophageal reflux disease) 11/07/2018  . History of CHF (congestive heart failure)   . Facial cellulitis   . Acute on chronic congestive heart failure (HCC)   . Gait abnormality 11/02/2018  . Cellulitis 11/01/2018  . Paroxysmal atrial fibrillation (HCC) 08/22/2018  . High risk medication use 08/22/2018  . Chronic anticoagulation 08/22/2018  . On amiodarone therapy 08/22/2018  . Lumbar radiculopathy 05/05/2018  . Hypertensive heart disease 04/24/2018  . CKD (chronic kidney disease) 11/27/2015  . Chronic diastolic heart failure (HCC) 11/27/2015  . Normocytic anemia 11/27/2015  . AKI (acute kidney injury) (HCC)   . Acute renal failure superimposed on stage 4 chronic kidney disease (HCC) 10/02/2015  . Hypotension 10/02/2015  . Anorexia   . Adjustment disorder with mixed emotional features 09/01/2015  . Acute encephalopathy 08/29/2015  . Elevated troponin 08/29/2015  . Renal cancer (HCC) 08/28/2015  . Spinal stenosis of lumbar region with radiculopathy 08/07/2015  . COPD (chronic obstructive pulmonary disease) (HCC)   . OSA (obstructive sleep apnea) 07/27/2015  . HNP (herniated nucleus pulposus), lumbar 07/24/2015  .   Renal cell carcinoma (HCC) 07/23/2015  . Essential hypertension 07/12/2015  . Renal mass 07/12/2015  . PMR (polymyalgia rheumatica) (HCC) 09/09/2013  . Rheumatoid arthritis (HCC) 04/13/2012  . CAD (coronary artery disease) 12/19/2011  . Type 2 diabetes mellitus with other specified complication (HCC) 12/19/2011  . Neuropathy (HCC) 12/19/2011    ,, PTA 12/04/2019,  1:15 PM  [facility] [address], [address] [place], [place], [address] Phone: [phone]   Fax:  [phone]  Name: Khalil C Berges MRN: [id number] Date of Birth: 08/28/1940   

## 2019-12-06 ENCOUNTER — Ambulatory Visit: Payer: No Typology Code available for payment source | Admitting: Physical Therapy

## 2019-12-06 ENCOUNTER — Other Ambulatory Visit: Payer: Self-pay

## 2019-12-06 ENCOUNTER — Encounter: Payer: Self-pay | Admitting: Physical Therapy

## 2019-12-06 DIAGNOSIS — M6281 Muscle weakness (generalized): Secondary | ICD-10-CM

## 2019-12-06 DIAGNOSIS — R2689 Other abnormalities of gait and mobility: Secondary | ICD-10-CM

## 2019-12-06 DIAGNOSIS — G8929 Other chronic pain: Secondary | ICD-10-CM

## 2019-12-06 DIAGNOSIS — M545 Low back pain, unspecified: Secondary | ICD-10-CM

## 2019-12-06 DIAGNOSIS — R2681 Unsteadiness on feet: Secondary | ICD-10-CM | POA: Diagnosis not present

## 2019-12-06 NOTE — Therapy (Signed)
[facility] [address] [place], [place], [address] Phone: [phone]   Fax:  [phone]  Physical Therapy Treatment  Patient Details  Name: Jacob [name] MRN: [id number] Date of Birth: Jul 25, 1940 Referring Provider (PT): Dr. Elberta Spaniel    Encounter Date: 12/06/2019   PT End of Session - 12/06/19 1232    Visit Number 13    Number of Visits 15    Date for PT Re-Evaluation 01/09/20    Authorization Type VA 15 visits 09/08/19-01/09/20    PT Start Time 1230   limited d/t pain   PT Stop Time 1301    PT Time Calculation (min) 31 min    Activity Tolerance Patient limited by pain    Behavior During Therapy [facility] for tasks assessed/performed           Past Medical History:  Diagnosis Date  . Allergy   . Anemia   . Anxiety   . Arthritis   . Asthma   . BPH (benign prostatic hyperplasia)   . Cancer of kidney ([place])   . Cataract   . CHF (congestive heart failure) ([place])   . Chronic kidney disease    chronic  kidney failure  kidney function at 42%  . COPD (chronic obstructive pulmonary disease) ([place])   . Coronary artery disease    CABG  7 bypasses  . Diabetes mellitus without complication ([place])   . Facial cellulitis   . GERD (gastroesophageal reflux disease)   . Gout   . Hepatitis    many years ago  . Hyperlipidemia   . Hypertension   . Lumbar disc disease   . Obesity   . Paroxysmal atrial fibrillation (HCC)   . Peripheral neuropathy   . Polymyalgia rheumatica (HCC)    maintained on Prednisone, Plaquenil. Followed by rhuematology every 4 months/[name].  . Shortness of breath dyspnea    with exertion  . Sleep apnea    CPAP   Trying to use    Past Surgical History:  Procedure Laterality Date  . APPENDECTOMY    . BIOPSY  01/23/2019   Procedure: BIOPSY;  Surgeon: Otis Brace, MD;  [facility];  Service: Gastroenterology;;  . [place] 2000   left  . CATARACT EXTRACTION  W/PHACO Right 11/28/2014   Procedure: CATARACT EXTRACTION PHACO AND INTRAOCULAR LENS PLACEMENT ([place]) RIGHT ;  Surgeon: Marylynn Pearson, MD;  Location: [place];  Service: Ophthalmology;  Laterality: Right;  . CORONARY ARTERY BYPASS GRAFT  03/17/1995   Wynonia Lawman; followed every six months.  . ESOPHAGOGASTRODUODENOSCOPY (EGD) WITH PROPOFOL Left 01/23/2019   Procedure: ESOPHAGOGASTRODUODENOSCOPY (EGD) WITH PROPOFOL;  Surgeon: Otis Brace, MD;  [facility];  Service: Gastroenterology;  Laterality: Left;  . HERNIA REPAIR    . LUMBAR LAMINECTOMY/DECOMPRESSION MICRODISCECTOMY N/A 07/30/2015   Procedure: LUMBAR LAMINECTOMY DISCECTOMY ;  Surgeon: Ashok Pall, MD;  Location: [place] [facility];  Service: Neurosurgery;  Laterality: N/A;  LUMBAR LAMINECTOMY DISCECTOMY   . LUMBAR LAMINECTOMY/DECOMPRESSION MICRODISCECTOMY N/A 09/06/2015   Procedure: Redo L3/4 Disectomy;  Surgeon: Ashok Pall, MD;  Location: [place] [facility];  Service: Neurosurgery;  Laterality: N/A;  . PROSTATE SURGERY     TURP at [place].  Marland Kitchen TRANSURETHRAL RESECTION OF PROSTATE      There were no vitals filed for this visit.   Subjective Assessment - 12/06/19 1235    Subjective My back and legs hurt very bad today, I do not know why and I did not sleep at all last  night    Currently in Pain? Yes   Back and legs hurt constantly 7-8/10   Pain Score 8     Pain Descriptors / Indicators Shooting;Spasm;Stabbing    Aggravating Factors  Not sure    Pain Relieving Factors not much                             [facility] Adult PT Treatment/Exercise - 12/06/19 0001      Lumbar Exercises: Seated   Sit to Stand Limitations red band punches forward and to the side 15x each     Other Seated Lumbar Exercises green band rows 25x    Other Seated Lumbar Exercises [place] blue 2# ball shoulder to shouler 10x       Knee/Hip Exercises: Aerobic   Nustep L2 x 10 min with discussion of status   PTA ckecking in with pt often for pain assess      Knee/Hip Exercises: Seated   Clamshell with TheraBand Blue   2x10   Marching Strengthening;Both;1 set;5 reps    Marching Limitations 3# resting on thigh    Stopped d/t pain   Abd/Adduction Limitations Ankle seated rocker board x 5 min     Sit to [employer] --   5 reps, very big effort = UE/LE usage     Moist Heat Therapy   Moist Heat Location --   To back during seated ex                   PT Short Term Goals - 11/21/19 1412      PT SHORT TERM GOAL #1   Title Pt will be independent with his initial HEP to improve flexibility, strength and balance.     Status Partially Met      PT SHORT TERM GOAL #2   Title perform 5x sit to stand in < or = to 25 seconds with UE support to improve balance    Status Achieved      PT SHORT TERM GOAL #3   Title Timed up and Go time improved to 29 sec or less    Status Achieved      PT SHORT TERM GOAL #4   Title The patient will be able to walk 200 feet with RW needed for improved short distance community mobility    Time 6    Period Weeks    Status On-going             PT Long Term Goals - 10/19/19 2112      PT LONG TERM GOAL #1   Title Pt will demo independence with his advanced HEP to futher promote strength gains after d/c from PT.    Time 12    Period Weeks    Status New      PT LONG TERM GOAL #2   Title Pt will demo improved functional strength and power of the LEs evident by his ability to complete 5x/sit to stand in less than 22 sec with moderate UE use.    Time 12    Period Weeks    Status New      PT LONG TERM GOAL #3   Title The patient will be able to walk for 3 minutes or 300 feet with RW needed for improved community mobility    Time 12    Period Weeks    Status New      PT LONG TERM GOAL #4  Title BERG balance score improved to 15/56    Time 12    Period Weeks    Status New      PT LONG TERM GOAL #5   Title Timed up and go improved to 22 sec indicating improved gait speed and safety    Time 12    Period  Weeks    Status New                 Plan - 12/06/19 1233    Clinical Impression Statement Pt with verbal reports of severe back pain and leg pain. He is frustrated by the pain inconsistencies. Moist heat helped pt complete his seated exercises without much increase in pain. Treatment ended at about 30 min due to sig fatigue.    Personal Factors and Comorbidities Age;Fitness;Comorbidity 1;Comorbidity 2;Comorbidity 3+;Time since onset of injury/illness/exacerbation    Comorbidities COPD; peripheral neuropathy; renal failure; diabetes; heart disease;  chronic LBP    Examination-Activity Limitations Bathing;Locomotion Level;Transfers;Bend;Carry;Dressing;Lift;Stand;Hygiene/Grooming    Examination-Participation Restrictions Community Activity;Interpersonal Relationship;Other;Meal Prep    Stability/Clinical Decision Making Evolving/Moderate complexity    Rehab Potential Good    PT Frequency 2x / week    PT Duration 12 weeks    PT Treatment/Interventions ADLs/Self Care Home Management;Moist Heat;Neuromuscular re-education;Balance training;Therapeutic exercise;Therapeutic activities;Functional mobility training;Gait training;Patient/family education;Manual techniques;Dry needling    PT Next Visit Plan Nu-Step L2; seated and standing general strengthening low level;  2  more visits under current VA authorization    Consulted and Agree with Plan of Care Patient           Patient will benefit from skilled therapeutic intervention in order to improve the following deficits and impairments:  Difficulty walking, Decreased endurance, Decreased activity tolerance, Impaired perceived functional ability, Pain, Decreased balance, Decreased mobility, Decreased strength, Postural dysfunction  Visit Diagnosis: Unsteadiness on feet  Muscle weakness (generalized)  Chronic low back pain, unspecified back pain laterality, unspecified whether sciatica present  Other abnormalities of gait and  mobility     Problem List Patient Active Problem List   Diagnosis Date Noted  . Other chronic pain   . Intractable nausea and vomiting 01/19/2019  . Dehydration   . Intractable vomiting   . Hypokalemia   . GERD (gastroesophageal reflux disease) 11/07/2018  . History of CHF (congestive heart failure)   . Facial cellulitis   . Acute on chronic congestive heart failure ([place])   . Gait abnormality 11/02/2018  . Cellulitis 11/01/2018  . Paroxysmal atrial fibrillation ([place]) 08/22/2018  . High risk medication use 08/22/2018  . Chronic anticoagulation 08/22/2018  . On amiodarone therapy 08/22/2018  . Lumbar radiculopathy 05/05/2018  . Hypertensive heart disease 04/24/2018  . CKD (chronic kidney disease) 11/27/2015  . Chronic diastolic heart failure ([place]) 11/27/2015  . Normocytic anemia 11/27/2015  . AKI (acute kidney injury) ([place])   . Acute renal failure superimposed on stage 4 chronic kidney disease ([place]) 10/02/2015  . Hypotension 10/02/2015  . Anorexia   . Adjustment disorder with mixed emotional features 09/01/2015  . Acute encephalopathy 08/29/2015  . Elevated troponin 08/29/2015  . Renal cancer ([place]) 08/28/2015  . Spinal stenosis of lumbar region with radiculopathy 08/07/2015  . COPD (chronic obstructive pulmonary disease) ([place])   . OSA (obstructive sleep apnea) 07/27/2015  . HNP (herniated nucleus pulposus), lumbar 07/24/2015  . Renal cell carcinoma ([place]) 07/23/2015  . Essential hypertension 07/12/2015  . Renal mass 07/12/2015  . PMR (polymyalgia rheumatica) ([place]) 09/09/2013  . Rheumatoid arthritis ([place]) 04/13/2012  . CAD (  coronary artery disease) 12/19/2011  . Type 2 diabetes mellitus with other specified complication ([place]) 49/32/4199  . Neuropathy ([place]) 12/19/2011    Astou Lada, PTA 12/06/2019, 1:03 PM  [facility] [address], [place], [place], [address] Phone: [phone]   Fax:   [phone]  Name: Jacob [name] MRN: [id number] Date of Birth: [birth year]

## 2019-12-12 ENCOUNTER — Ambulatory Visit: Payer: No Typology Code available for payment source | Admitting: Physical Therapy

## 2019-12-12 ENCOUNTER — Other Ambulatory Visit: Payer: Self-pay

## 2019-12-12 DIAGNOSIS — J45909 Unspecified asthma, uncomplicated: Secondary | ICD-10-CM | POA: Insufficient documentation

## 2019-12-12 DIAGNOSIS — N184 Chronic kidney disease, stage 4 (severe): Secondary | ICD-10-CM | POA: Insufficient documentation

## 2019-12-12 DIAGNOSIS — E669 Obesity, unspecified: Secondary | ICD-10-CM | POA: Insufficient documentation

## 2019-12-12 DIAGNOSIS — N400 Enlarged prostate without lower urinary tract symptoms: Secondary | ICD-10-CM | POA: Insufficient documentation

## 2019-12-12 DIAGNOSIS — M6281 Muscle weakness (generalized): Secondary | ICD-10-CM

## 2019-12-12 DIAGNOSIS — G4730 Sleep apnea, unspecified: Secondary | ICD-10-CM | POA: Insufficient documentation

## 2019-12-12 DIAGNOSIS — C649 Malignant neoplasm of unspecified kidney, except renal pelvis: Secondary | ICD-10-CM | POA: Insufficient documentation

## 2019-12-12 DIAGNOSIS — H269 Unspecified cataract: Secondary | ICD-10-CM | POA: Insufficient documentation

## 2019-12-12 DIAGNOSIS — M519 Unspecified thoracic, thoracolumbar and lumbosacral intervertebral disc disorder: Secondary | ICD-10-CM | POA: Insufficient documentation

## 2019-12-12 DIAGNOSIS — K759 Inflammatory liver disease, unspecified: Secondary | ICD-10-CM | POA: Insufficient documentation

## 2019-12-12 DIAGNOSIS — I2510 Atherosclerotic heart disease of native coronary artery without angina pectoris: Secondary | ICD-10-CM | POA: Insufficient documentation

## 2019-12-12 DIAGNOSIS — M545 Low back pain, unspecified: Secondary | ICD-10-CM

## 2019-12-12 DIAGNOSIS — R2689 Other abnormalities of gait and mobility: Secondary | ICD-10-CM

## 2019-12-12 DIAGNOSIS — G8929 Other chronic pain: Secondary | ICD-10-CM

## 2019-12-12 DIAGNOSIS — I10 Essential (primary) hypertension: Secondary | ICD-10-CM | POA: Insufficient documentation

## 2019-12-12 DIAGNOSIS — G629 Polyneuropathy, unspecified: Secondary | ICD-10-CM | POA: Insufficient documentation

## 2019-12-12 DIAGNOSIS — E785 Hyperlipidemia, unspecified: Secondary | ICD-10-CM | POA: Insufficient documentation

## 2019-12-12 DIAGNOSIS — M353 Polymyalgia rheumatica: Secondary | ICD-10-CM | POA: Insufficient documentation

## 2019-12-12 DIAGNOSIS — D649 Anemia, unspecified: Secondary | ICD-10-CM | POA: Insufficient documentation

## 2019-12-12 DIAGNOSIS — F419 Anxiety disorder, unspecified: Secondary | ICD-10-CM | POA: Insufficient documentation

## 2019-12-12 DIAGNOSIS — M109 Gout, unspecified: Secondary | ICD-10-CM | POA: Insufficient documentation

## 2019-12-12 DIAGNOSIS — I509 Heart failure, unspecified: Secondary | ICD-10-CM | POA: Insufficient documentation

## 2019-12-12 DIAGNOSIS — R2681 Unsteadiness on feet: Secondary | ICD-10-CM | POA: Diagnosis not present

## 2019-12-12 DIAGNOSIS — T7840XA Allergy, unspecified, initial encounter: Secondary | ICD-10-CM | POA: Insufficient documentation

## 2019-12-12 DIAGNOSIS — E119 Type 2 diabetes mellitus without complications: Secondary | ICD-10-CM | POA: Insufficient documentation

## 2019-12-12 DIAGNOSIS — N189 Chronic kidney disease, unspecified: Secondary | ICD-10-CM | POA: Insufficient documentation

## 2019-12-12 DIAGNOSIS — M1990 Unspecified osteoarthritis, unspecified site: Secondary | ICD-10-CM | POA: Insufficient documentation

## 2019-12-12 NOTE — Progress Notes (Signed)
Cardiology Office Note:    Date:  12/13/2019   ID:  Jacob [name], DOB March 09, 1941, MRN [id number]  PCP:  Gifford Shave, MD  Cardiologist:  Shirlee More, MD    Referring MD: Gifford Shave, MD    ASSESSMENT:    1. Dyslipidemia   2. Paroxysmal atrial fibrillation (HCC)   3. Chronic anticoagulation   4. Hypertensive heart disease with chronic diastolic congestive heart failure (HCC)   5. Stage 4 chronic kidney disease ([place])   6. Coronary artery disease of native artery of native heart with stable angina pectoris ([place])    PLAN:    In order of problems listed above:  1. I reviewed the notes from neurology with the patient and his daughter will stop his statin I would not reintroduce any lipid-lowering therapy other than icosapent ethyl or PCSK9 inhibitor in the future.  I have arrangements to have treatment evaluation,?  EMG nerve conduction studies at [facility]. 2. Stable off amiodarone maintain sinus rhythm continue anticoagulant 3. BP at target heart failure compensated his current dose of diuretic and antihypertensives 4. Stable, his daughter obtain copies labs [facility] for me to his lab work from the [place] 5. Stable CAD no anginal discomfort with all of his comorbidities at continue medical therapy including his beta-blocker 6. Stable EKG pattern right bundle branch block   Next appointment: 3 months   Medication Adjustments/Labs and Tests Ordered: Current medicines are reviewed at length with the patient today.  Concerns regarding medicines are outlined above.  No orders of the defined types were placed in this encounter.  No orders of the defined types were placed in this encounter.  Here to discuss withdrawal of statin  History of Present Illness:    Jacob [name] is a 79 y.o. male with a hx of CAD s/p CABGx7 1997, DM2, HTN, HF, PAF on amiodarone & chronic anticoagulation, right branch block, CKD, COPD, and OSA   He was last seen 05/23/2019 after  amiodarone was discontinued.  His neurologist at the [facility] thinks he may have a statin induced myopathy as that being off of statin for several months was an adequate to see response and advised to stop statin therapy.. Compliance with diet, lifestyle and medications: Yes  His daughter is present participates in the evaluation and decision-making.  Is having progressive muscle weakening especially in his proximal lower extremities he will stop his statin wait 6 weeks check a lipid profile if he requires therapy I would only use a PCSK9 inhibitor.  Fortunately no chest pain palpitation or syncope.  She asked about his diuretic he has CKD he takes a stable dose of furosemide 40 alternating with 80 mg daily has no edema no shortness of breath not can alter the treatment and she will make arrangements for me to have access to those labs at the [facility] hospital.  His CAD is stable no anginal discomfort blood pressure is at target maintain sinus rhythm and tolerates his anticoagulant without bleeding  EKG today is a stable pattern right bundle branch block old inferior MI first-degree AV block. Past Medical History:  Diagnosis Date  . Acute encephalopathy 08/29/2015  . Acute on chronic congestive heart failure ([place])   . Acute renal failure superimposed on stage 4 chronic kidney disease ([place]) 10/02/2015  . Adjustment disorder with mixed emotional features 09/01/2015  . AKI (acute kidney injury) ([place])   . Allergy   . Anemia   . Anxiety   . Arthritis   .  Asthma   . BPH (benign prostatic hyperplasia)   . CAD (coronary artery disease) 12/19/2011  . Cancer of kidney ([place])   . Cataract   . CHF (congestive heart failure) ([place])   . Chronic anticoagulation 08/22/2018  . Chronic diastolic heart failure ([place]) 11/27/2015  . Chronic kidney disease    chronic  kidney failure  kidney function at 42%  . CKD (chronic kidney disease) 11/27/2015  . COPD (chronic obstructive pulmonary disease) ([place])   . Coronary artery  disease    CABG  7 bypasses  . Dehydration   . Diabetes mellitus without complication ([place])   . Elevated troponin 08/29/2015  . Essential hypertension 07/12/2015  . Facial cellulitis   . GERD (gastroesophageal reflux disease)   . Gout   . Hepatitis    many years ago  . High risk medication use 08/22/2018  . History of CHF (congestive heart failure)   . HNP (herniated nucleus pulposus), lumbar 07/24/2015  . Hyperlipidemia   . Hypertension   . Hypertensive heart disease 04/24/2018  . Hypokalemia   . Hypotension 10/02/2015  . Lumbar disc disease   . Lumbar radiculopathy 05/05/2018  . Neuropathy ([place]) 12/19/2011  . Normocytic anemia 11/27/2015  . Obesity   . On amiodarone therapy 08/22/2018  . OSA (obstructive sleep apnea) 07/27/2015  . Paroxysmal atrial fibrillation (HCC)   . Peripheral neuropathy   . PMR (polymyalgia rheumatica) ([place]) 09/09/2013  . Polymyalgia rheumatica (HCC)    maintained on Prednisone, Plaquenil. Followed by rhuematology every 4 months/[name].  . Renal cell carcinoma ([place]) 07/23/2015  . Renal mass 07/12/2015  . Rheumatoid arthritis ([place]) 04/13/2012  . Shortness of breath dyspnea    with exertion  . Sleep apnea    CPAP   Trying to use  . Spinal stenosis of lumbar region with radiculopathy 08/07/2015  . Type 2 diabetes mellitus with other specified complication ([place]) 31/07/4006    Past Surgical History:  Procedure Laterality Date  . APPENDECTOMY    . BIOPSY  01/23/2019   Procedure: BIOPSY;  Surgeon: Otis Brace, MD;  [facility];  Service: Gastroenterology;;  . [place] 2000   left  . CATARACT EXTRACTION W/PHACO Right 11/28/2014   Procedure: CATARACT EXTRACTION PHACO AND INTRAOCULAR LENS PLACEMENT ([place]) RIGHT ;  Surgeon: Marylynn Pearson, MD;  Location: [place];  Service: Ophthalmology;  Laterality: Right;  . CORONARY ARTERY BYPASS GRAFT  03/17/1995   Wynonia Lawman; followed every six months.  . ESOPHAGOGASTRODUODENOSCOPY (EGD) WITH PROPOFOL Left  01/23/2019   Procedure: ESOPHAGOGASTRODUODENOSCOPY (EGD) WITH PROPOFOL;  Surgeon: Otis Brace, MD;  [facility];  Service: Gastroenterology;  Laterality: Left;  . HERNIA REPAIR    . LUMBAR LAMINECTOMY/DECOMPRESSION MICRODISCECTOMY N/A 07/30/2015   Procedure: LUMBAR LAMINECTOMY DISCECTOMY ;  Surgeon: Ashok Pall, MD;  Location: [place] [facility];  Service: Neurosurgery;  Laterality: N/A;  LUMBAR LAMINECTOMY DISCECTOMY   . LUMBAR LAMINECTOMY/DECOMPRESSION MICRODISCECTOMY N/A 09/06/2015   Procedure: Redo L3/4 Disectomy;  Surgeon: Ashok Pall, MD;  Location: [place] [facility];  Service: Neurosurgery;  Laterality: N/A;  . PROSTATE SURGERY     TURP at [place].  Marland Kitchen TRANSURETHRAL RESECTION OF PROSTATE      Current Medications: No outpatient medications have been marked as taking for the 12/13/19 encounter (Appointment) with Richardo Priest, MD.     Allergies:   Ace inhibitors, Actonel [risedronate], Ciprocinonide [fluocinolone], Flunisolide, Metformin and related, Sertraline, Sulindac, Terazosin, and Oxycodone   Social History   Socioeconomic History  . Marital status: Married  Spouse name: Not on file  . Number of children: Not on file  . Years of education: Not on file  . Highest education level: Not on file  Occupational History  . Occupation: Geophysicist/field seismologist  Tobacco Use  . Smoking status: Former Smoker    Types: Cigarettes    Quit date: 03/16/1957    Years since quitting: 62.7  . Smokeless tobacco: Never Used  Vaping Use  . Vaping Use: Never used  Substance and Sexual Activity  . Alcohol use: No  . Drug use: No  . Sexual activity: Not Currently    Birth control/protection: None  Other Topics Concern  . Not on file  Social History Narrative   Marital status: Married x 55 years.      Children: 3 children; 5 grandchildren; 6 gg.      Lives: with wife.        Employment: retired; [employer].  Working every other week; 45 hours.      Tobacco:  Quit at age 73.       Alcohol:  None      Exercise: none      ADLs: indepedent with ADLs;  Uses cane and has a walker.  Drives.      Advanced Directives:  +Living Will.  DNR/DNI.  Wife is HCPOA.   Right hand   One story   Social Determinants of Health   Financial Resource Strain:   . Difficulty of Paying Living Expenses: Not on file  Food Insecurity: No Food Insecurity  . Worried About Charity fundraiser in the Last Year: Never true  . Ran Out of Food in the Last Year: Never true  Transportation Needs: No Transportation Needs  . Lack of Transportation (Medical): No  . Lack of Transportation (Non-Medical): No  Physical Activity:   . Days of Exercise per Week: Not on file  . Minutes of Exercise per Session: Not on file  Stress:   . Feeling of Stress : Not on file  Social Connections:   . Frequency of Communication with Friends and Family: Not on file  . Frequency of Social Gatherings with Friends and Family: Not on file  . Attends Religious Services: Not on file  . Active Member of Clubs or Organizations: Not on file  . Attends Archivist Meetings: Not on file  . Marital Status: Not on file     Family History: The patient's family history includes Diabetes in his brother; Hypertension in an other family member; Kidney failure in his brother. ROS:   Please see the history of present illness.    All other systems reviewed and are negative.  EKGs/Labs/Other Studies Reviewed:    The following studies were reviewed today:  EKG:  EKG ordered today and personally reviewed.  The ekg ordered today demonstrates sinus rhythm right bundle branch block old inferior MI first-degree AV block  Recent Labs: 01/19/2019: B Natriuretic Peptide 267.3 01/20/2019: Magnesium 2.2 01/23/2019: ALT 13; BUN 19; Creatinine, Ser 2.51; Hemoglobin 10.7; Platelets 185; Potassium 4.8; Sodium 142  Recent Lipid Panel    Component Value Date/Time   CHOL 167 08/23/2018 1058   TRIG 104 08/23/2018 1058   HDL 52  08/23/2018 1058   CHOLHDL 3.2 08/23/2018 1058   CHOLHDL 4.1 01/24/2014 1425   VLDL 23 01/24/2014 1425   LDLCALC 94 08/23/2018 1058    Physical Exam:    VS:  There were no vitals taken for this visit.    Wt Readings from  Last 3 Encounters:  05/23/19 226 lb (102.5 kg)  02/28/19 220 lb (99.8 kg)  01/19/19 210 lb 12.2 oz (95.6 kg)     GEN: He looks increasingly weak he is in a wheelchair well nourished, well developed in no acute distress HEENT: Normal NECK: No JVD; No carotid bruits LYMPHATICS: No lymphadenopathy CARDIAC: RRR, no murmurs, rubs, gallops RESPIRATORY:  Clear to auscultation without rales, wheezing or rhonchi  ABDOMEN: Soft, non-tender, non-distended MUSCULOSKELETAL:  No edema; No deformity  SKIN: Warm and dry NEUROLOGIC:  Alert and oriented x 3 PSYCHIATRIC:  Normal affect    Signed, Shirlee More, MD  12/13/2019 10:29 AM    [place]

## 2019-12-12 NOTE — Therapy (Signed)
[facility] [address], [place], [place], [address] Phone: [phone]   Fax:  [phone]  Physical Therapy Treatment/Recertification   Patient Details  Name: Jacob [name] MRN: [id number] Date of Birth: [birth year] Referring Provider (PT): Dr. Elberta Spaniel    Encounter Date: 12/12/2019   PT End of Session - 12/12/19 1411    Visit Number 14    Number of Visits 15    Date for PT Re-Evaluation 02/06/20    Authorization Type VA 15 visits 09/08/19-01/09/20;  will submit for additional visits    PT Start Time 1400    PT Stop Time 1440    PT Time Calculation (min) 40 min    Activity Tolerance Patient limited by pain           Past Medical History:  Diagnosis Date  . Acute encephalopathy 08/29/2015  . Acute on chronic congestive heart failure ([place])   . Acute renal failure superimposed on stage 4 chronic kidney disease ([place]) 10/02/2015  . Adjustment disorder with mixed emotional features 09/01/2015  . AKI (acute kidney injury) ([place])   . Allergy   . Anemia   . Anxiety   . Arthritis   . Asthma   . BPH (benign prostatic hyperplasia)   . CAD (coronary artery disease) 12/19/2011  . Cancer of kidney ([place])   . Cataract   . CHF (congestive heart failure) ([place])   . Chronic anticoagulation 08/22/2018  . Chronic diastolic heart failure ([place]) 11/27/2015  . Chronic kidney disease    chronic  kidney failure  kidney function at 42%  . CKD (chronic kidney disease) 11/27/2015  . COPD (chronic obstructive pulmonary disease) ([place])   . Coronary artery disease    CABG  7 bypasses  . Dehydration   . Diabetes mellitus without complication ([place])   . Elevated troponin 08/29/2015  . Essential hypertension 07/12/2015  . Facial cellulitis   . GERD (gastroesophageal reflux disease)   . Gout   . Hepatitis    many years ago  . High risk medication use 08/22/2018  . History of CHF (congestive heart failure)   . HNP (herniated nucleus pulposus),  lumbar 07/24/2015  . Hyperlipidemia   . Hypertension   . Hypertensive heart disease 04/24/2018  . Hypokalemia   . Hypotension 10/02/2015  . Lumbar disc disease   . Lumbar radiculopathy 05/05/2018  . Neuropathy ([place]) 12/19/2011  . Normocytic anemia 11/27/2015  . Obesity   . On amiodarone therapy 08/22/2018  . OSA (obstructive sleep apnea) 07/27/2015  . Paroxysmal atrial fibrillation (HCC)   . Peripheral neuropathy   . PMR (polymyalgia rheumatica) ([place]) 09/09/2013  . Polymyalgia rheumatica (HCC)    maintained on Prednisone, Plaquenil. Followed by rhuematology every 4 months/[name].  . Renal cell carcinoma ([place]) 07/23/2015  . Renal mass 07/12/2015  . Rheumatoid arthritis ([place]) 04/13/2012  . Shortness of breath dyspnea    with exertion  . Sleep apnea    CPAP   Trying to use  . Spinal stenosis of lumbar region with radiculopathy 08/07/2015  . Type 2 diabetes mellitus with other specified complication ([place]) 43/05/2949    Past Surgical History:  Procedure Laterality Date  . APPENDECTOMY    . BIOPSY  01/23/2019   Procedure: BIOPSY;  Surgeon: Otis Brace, MD;  Location: [facility];  Service: Gastroenterology;;  . Newborn 2000   left  . CATARACT EXTRACTION W/PHACO Right 11/28/2014   Procedure: CATARACT EXTRACTION PHACO AND INTRAOCULAR LENS PLACEMENT (IOC) RIGHT ;  Surgeon: Marylynn Pearson, MD;  Location: [place];  Service: Ophthalmology;  Laterality: Right;  . CORONARY ARTERY BYPASS GRAFT  03/17/1995   Wynonia Lawman; followed every six months.  . ESOPHAGOGASTRODUODENOSCOPY (EGD) WITH PROPOFOL Left 01/23/2019   Procedure: ESOPHAGOGASTRODUODENOSCOPY (EGD) WITH PROPOFOL;  Surgeon: Otis Brace, MD;  Location: [facility];  Service: Gastroenterology;  Laterality: Left;  . HERNIA REPAIR    . LUMBAR LAMINECTOMY/DECOMPRESSION MICRODISCECTOMY N/A 07/30/2015   Procedure: LUMBAR LAMINECTOMY DISCECTOMY ;  Surgeon: Ashok Pall, MD;  Location: [facility];  Service: Neurosurgery;  Laterality:  N/A;  LUMBAR LAMINECTOMY DISCECTOMY   . LUMBAR LAMINECTOMY/DECOMPRESSION MICRODISCECTOMY N/A 09/06/2015   Procedure: Redo L3/4 Disectomy;  Surgeon: Ashok Pall, MD;  Location: [place] [facility];  Service: Neurosurgery;  Laterality: N/A;  . PROSTATE SURGERY     TURP at [place].  Marland Kitchen TRANSURETHRAL RESECTION OF PROSTATE      There were no vitals filed for this visit.   Subjective Assessment - 12/12/19 1402    Subjective I'm in bad shape.  My back has been really hurting.  I feel like I need to keep coming b/c otherwise I know I won't get better on my own.    Pertinent History had HHPT 2 months ago after hospitalized 5 days related to kidney    How long can you walk comfortably? no where comfortably, need RW to walk apartment to car 100 feet    Currently in Pain? Yes    Pain Score 8     Pain Location Back              [facility] PT Assessment - 12/12/19 0001      Standardized Balance Assessment   Five times sit to stand comments  21.98 UE assist      Berg Balance Test   Sit to Stand Needs moderate or maximal assist to stand    Standing Unsupported Unable to stand 30 seconds unassisted    Sitting with Back Unsupported but Feet Supported on Floor or Stool Able to sit safely and securely 2 minutes    Stand to Sit Controls descent by using hands    Transfers Able to transfer safely, definite need of hands    Standing Unsupported with Eyes Closed Needs help to keep from falling    Standing Unsupported with Feet Together Needs help to attain position and unable to hold for 15 seconds    From Standing, Reach Forward with Outstretched Arm Reaches forward but needs supervision    From Standing Position, Pick up Object from Floor Unable to try/needs assist to keep balance    From Standing Position, Turn to Look Behind Over each Shoulder Needs supervision when turning    Turn 360 Degrees Needs assistance while turning    Standing Unsupported, Alternately Place Feet on Step/Stool Needs assistance to keep from  falling or unable to try    Standing Unsupported, One Foot in [employer] balance while stepping or standing    Standing on One Leg Unable to try or needs assist to prevent fall    Total Score 12      Timed Up and Go Test   Normal TUG (seconds) 29.08    TUG Comments with RW                         [facility] Adult PT Treatment/Exercise - 12/12/19 0001      Therapeutic Activites    ADL's close supervision/min assist with descending curb; gait 90  feet max with RW       Lumbar Exercises: Seated   Other Seated Lumbar Exercises blue band rows 20x    Other Seated Lumbar Exercises blue band single shoulder extensions 20x right/left       Knee/Hip Exercises: Aerobic   Nustep L1 10 min    while discussing progress     Knee/Hip Exercises: Standing   Heel Raises Right;Left;10 reps    Heel Raises Limitations 5# resting on knee      Knee/Hip Exercises: Seated   Clamshell with TheraBand Blue   2x10   Sit to Sand 5 reps   UE assist     Shoulder Exercises: Seated   Other Seated Exercises green band punches 15x right/left                     PT Short Term Goals - 12/12/19 2140      PT SHORT TERM GOAL #1   Title Pt will be independent with his initial HEP to improve flexibility, strength and balance.     Status Partially Met      PT SHORT TERM GOAL #2   Title perform 5x sit to stand in < or = to 25 seconds with UE support to improve balance    Status Achieved      PT SHORT TERM GOAL #3   Title Timed up and Go time improved to 29 sec or less    Status Achieved      PT SHORT TERM GOAL #4   Title The patient will be able to walk 200 feet with RW needed for improved short distance community mobility    Status Deferred             PT Long Term Goals - 12/12/19 2141      PT LONG TERM GOAL #1   Title Pt will demo independence with his advanced HEP to futher promote strength gains after d/c from PT.    Time 8    Period Weeks    Status On-going    Target Date  02/06/20      PT LONG TERM GOAL #2   Title Pt will demo improved functional strength and power of the LEs evident by his ability to complete 5x/sit to stand in less than 22 sec with moderate UE use.    Time 8    Status Achieved      PT LONG TERM GOAL #3   Title The patient will be able to stand for 30 sec with minimal UE support without loss of balance needed for personal care/grooming    Time 8    Period Weeks    Status Revised      PT LONG TERM GOAL #4   Title BERG balance score improved to 17/56    Time 8    Period Weeks    Status Revised      PT LONG TERM GOAL #5   Title Timed up and go improved to 22 sec indicating improved gait speed and safety    Time 8    Period Weeks                 Plan - 12/12/19 2131    Clinical Impression Statement The patient has progressed at a slower rate and variable status secondary to numerous co-morbidities.  His severe back pain level has been a limiting factor in rehab and limits his standing tolerance to 1-2 minutes.  He ambulates with a RW  full time max distance about 90 feet with progressively increasing forward trunk flexion.  He is at very high risk of falls with a BERG balance score of 12/56 (slightly better from 10/56).  His Timed up and Go test is variable with a speed of 29 sec today.  His 5x sit to stand test has been steadily improving each assessment time 21.98 sec today.  Although progress is very slow he would benefit from additional PT.  Without skilled intervention he would have a definite decline in function and mobility    Personal Factors and Comorbidities Age;Fitness;Comorbidity 1;Comorbidity 2;Comorbidity 3+;Time since onset of injury/illness/exacerbation    Comorbidities COPD; peripheral neuropathy; renal failure; diabetes; heart disease;  chronic LBP    Examination-Activity Limitations Bathing;Locomotion Level;Transfers;Bend;Carry;Dressing;Lift;Stand;Hygiene/Grooming    Rehab Potential Good    PT Frequency 2x / week     PT Duration 12 weeks    PT Treatment/Interventions ADLs/Self Care Home Management;Moist Heat;Neuromuscular re-education;Balance training;Therapeutic exercise;Therapeutic activities;Functional mobility training;Gait training;Patient/family education;Manual techniques;Dry needling    PT Next Visit Plan KX next visit;  Nu-Step L2; seated and standing general strengthening low level;  will submit for additional  VA authorization for more visits           Patient will benefit from skilled therapeutic intervention in order to improve the following deficits and impairments:  Difficulty walking, Decreased endurance, Decreased activity tolerance, Impaired perceived functional ability, Pain, Decreased balance, Decreased mobility, Decreased strength, Postural dysfunction  Visit Diagnosis: Unsteadiness on feet - Plan: PT plan of care cert/re-cert  Muscle weakness (generalized) - Plan: PT plan of care cert/re-cert  Chronic low back pain, unspecified back pain laterality, unspecified whether sciatica present - Plan: PT plan of care cert/re-cert  Other abnormalities of gait and mobility - Plan: PT plan of care cert/re-cert     Problem List Patient Active Problem List   Diagnosis Date Noted  . Sleep apnea   . Polymyalgia rheumatica ([place])   . Peripheral neuropathy   . Obesity   . Lumbar disc disease   . Hypertension   . Hyperlipidemia   . Hepatitis   . Gout   . Diabetes mellitus without complication ([place])   . Coronary artery disease   . Chronic kidney disease   . CHF (congestive heart failure) ([place])   . Cataract   . Cancer of kidney ([place])   . BPH (benign prostatic hyperplasia)   . Asthma   . Arthritis   . Anxiety   . Anemia   . Allergy   . Other chronic pain   . Intractable nausea and vomiting 01/19/2019  . Intractable vomiting   . Hypokalemia   . GERD (gastroesophageal reflux disease) 11/07/2018  . History of CHF (congestive heart failure)   . Facial cellulitis   . Acute on  chronic congestive heart failure ([place])   . Gait abnormality 11/02/2018  . Cellulitis 11/01/2018  . Paroxysmal atrial fibrillation ([place]) 08/22/2018  . High risk medication use 08/22/2018  . Chronic anticoagulation 08/22/2018  . On amiodarone therapy 08/22/2018  . Lumbar radiculopathy 05/05/2018  . Hypertensive heart disease 04/24/2018  . CKD (chronic kidney disease) 11/27/2015  . Chronic diastolic heart failure ([place]) 11/27/2015  . Normocytic anemia 11/27/2015  . AKI (acute kidney injury) ([place])   . Acute renal failure superimposed on stage 4 chronic kidney disease ([place]) 10/02/2015  . Hypotension 10/02/2015  . Anorexia   . Adjustment disorder with mixed emotional features 09/01/2015  . Acute encephalopathy 08/29/2015  . Elevated troponin 08/29/2015  .  Renal cancer ([place]) 08/28/2015  . Spinal stenosis of lumbar region with radiculopathy 08/07/2015  . COPD (chronic obstructive pulmonary disease) ([place])   . OSA (obstructive sleep apnea) 07/27/2015  . HNP (herniated nucleus pulposus), lumbar 07/24/2015  . Renal cell carcinoma ([place]) 07/23/2015  . Essential hypertension 07/12/2015  . Renal mass 07/12/2015  . PMR (polymyalgia rheumatica) ([place]) 09/09/2013  . Rheumatoid arthritis ([place]) 04/13/2012  . CAD (coronary artery disease) 12/19/2011  . Type 2 diabetes mellitus with other specified complication ([place]) 05/69/7948  . Neuropathy ([place]) 12/19/2011   Ruben Im, PT 12/12/19 9:48 PM Phone: [phone] Fax: [phone] Alvera Singh 12/12/2019, 9:48 PM  [facility] [address], [place], [place], [address] Phone: [phone]   Fax:  [phone]  Name: Jacob [name] MRN: [id number] Date of Birth: 1940/12/10

## 2019-12-13 ENCOUNTER — Encounter: Payer: Self-pay | Admitting: Cardiology

## 2019-12-13 ENCOUNTER — Other Ambulatory Visit: Payer: Self-pay

## 2019-12-13 ENCOUNTER — Ambulatory Visit (INDEPENDENT_AMBULATORY_CARE_PROVIDER_SITE_OTHER): Payer: HMO | Admitting: Cardiology

## 2019-12-13 VITALS — BP 142/70 | HR 69 | Ht 71.0 in | Wt 222.0 lb

## 2019-12-13 DIAGNOSIS — I25118 Atherosclerotic heart disease of native coronary artery with other forms of angina pectoris: Secondary | ICD-10-CM | POA: Diagnosis not present

## 2019-12-13 DIAGNOSIS — I110 Hypertensive heart disease with heart failure: Secondary | ICD-10-CM

## 2019-12-13 DIAGNOSIS — E785 Hyperlipidemia, unspecified: Secondary | ICD-10-CM | POA: Diagnosis not present

## 2019-12-13 DIAGNOSIS — I480 Paroxysmal atrial fibrillation: Secondary | ICD-10-CM

## 2019-12-13 DIAGNOSIS — Z7901 Long term (current) use of anticoagulants: Secondary | ICD-10-CM | POA: Diagnosis not present

## 2019-12-13 DIAGNOSIS — N184 Chronic kidney disease, stage 4 (severe): Secondary | ICD-10-CM

## 2019-12-13 DIAGNOSIS — I5032 Chronic diastolic (congestive) heart failure: Secondary | ICD-10-CM | POA: Diagnosis not present

## 2019-12-13 NOTE — Patient Instructions (Signed)
Medication Instructions:  Your physician has recommended you make the following change in your medication:  STOP: Atorvastatin *If you need a refill on your cardiac medications before your next appointment, please call your pharmacy*   Lab Work: Your physician recommends that you return for lab work in: 6 weeks Lipids  If you have labs (blood work) drawn today and your tests are completely normal, you will receive your results only by: Marland Kitchen MyChart Message (if you have MyChart) OR . A paper copy in the mail If you have any lab test that is abnormal or we need to change your treatment, we will call you to review the results.   Testing/Procedures: None   Follow-Up: At [facility], you and your health needs are our priority.  As part of our continuing mission to provide you with exceptional heart care, we have created designated Provider Care Teams.  These Care Teams include your primary Cardiologist (physician) and Advanced Practice Providers (APPs -  Physician Assistants and Nurse Practitioners) who all work together to provide you with the care you need, when you need it.  We recommend signing up for the patient portal called "MyChart".  Sign up information is provided on this After Visit Summary.  MyChart is used to connect with patients for Virtual Visits (Telemedicine).  Patients are able to view lab/test results, encounter notes, upcoming appointments, etc.  Non-urgent messages can be sent to your provider as well.   To learn more about what you can do with MyChart, go to [email or web address].    Your next appointment:   3 month(s)  The format for your next appointment:   In Person  Provider:   Shirlee More, MD   Other Instructions

## 2019-12-14 ENCOUNTER — Encounter: Payer: HMO | Admitting: Physical Therapy

## 2019-12-18 ENCOUNTER — Encounter: Payer: HMO | Admitting: Physical Therapy

## 2019-12-21 ENCOUNTER — Ambulatory Visit: Payer: No Typology Code available for payment source | Attending: Internal Medicine | Admitting: Physical Therapy

## 2019-12-21 ENCOUNTER — Other Ambulatory Visit: Payer: Self-pay

## 2019-12-21 DIAGNOSIS — M545 Low back pain, unspecified: Secondary | ICD-10-CM | POA: Insufficient documentation

## 2019-12-21 DIAGNOSIS — M6281 Muscle weakness (generalized): Secondary | ICD-10-CM | POA: Diagnosis not present

## 2019-12-21 DIAGNOSIS — R2689 Other abnormalities of gait and mobility: Secondary | ICD-10-CM | POA: Diagnosis not present

## 2019-12-21 DIAGNOSIS — G8929 Other chronic pain: Secondary | ICD-10-CM | POA: Insufficient documentation

## 2019-12-21 DIAGNOSIS — R2681 Unsteadiness on feet: Secondary | ICD-10-CM | POA: Diagnosis not present

## 2019-12-21 NOTE — Therapy (Signed)
[facility] [address] [place], [place], [address] Phone: [phone]   Fax:  [phone]  Physical Therapy Treatment  Patient Details  Name: Jacob [name] MRN: [id number] Date of Birth: 10-May-1940 Referring Provider (PT): Dr. Elberta Spaniel    Encounter Date: 12/21/2019   PT End of Session - 12/21/19 1245    Visit Number 15    Number of Visits 15    Date for PT Re-Evaluation 02/06/20    Authorization Type VA 15 visits 09/08/19-01/09/20;  will submit for additional visits    PT Start Time 1218    PT Stop Time 1300    PT Time Calculation (min) 42 min    Activity Tolerance Patient limited by pain           Past Medical History:  Diagnosis Date  . Acute encephalopathy 08/29/2015  . Acute on chronic congestive heart failure ([place])   . Acute renal failure superimposed on stage 4 chronic kidney disease ([place]) 10/02/2015  . Adjustment disorder with mixed emotional features 09/01/2015  . AKI (acute kidney injury) ([place])   . Allergy   . Anemia   . Anxiety   . Arthritis   . Asthma   . BPH (benign prostatic hyperplasia)   . CAD (coronary artery disease) 12/19/2011  . Cancer of kidney ([place])   . Cataract   . CHF (congestive heart failure) ([place])   . Chronic anticoagulation 08/22/2018  . Chronic diastolic heart failure ([place]) 11/27/2015  . Chronic kidney disease    chronic  kidney failure  kidney function at 42%  . CKD (chronic kidney disease) 11/27/2015  . COPD (chronic obstructive pulmonary disease) ([place])   . Coronary artery disease    CABG  7 bypasses  . Dehydration   . Diabetes mellitus without complication ([place])   . Elevated troponin 08/29/2015  . Essential hypertension 07/12/2015  . Facial cellulitis   . GERD (gastroesophageal reflux disease)   . Gout   . Hepatitis    many years ago  . High risk medication use 08/22/2018  . History of CHF (congestive heart failure)   . HNP (herniated nucleus pulposus), lumbar 07/24/2015  .  Hyperlipidemia   . Hypertension   . Hypertensive heart disease 04/24/2018  . Hypokalemia   . Hypotension 10/02/2015  . Lumbar disc disease   . Lumbar radiculopathy 05/05/2018  . Neuropathy ([place]) 12/19/2011  . Normocytic anemia 11/27/2015  . Obesity   . On amiodarone therapy 08/22/2018  . OSA (obstructive sleep apnea) 07/27/2015  . Paroxysmal atrial fibrillation (HCC)   . Peripheral neuropathy   . PMR (polymyalgia rheumatica) ([place]) 09/09/2013  . Polymyalgia rheumatica (HCC)    maintained on Prednisone, Plaquenil. Followed by rhuematology every 4 months/[name].  . Renal cell carcinoma ([place]) 07/23/2015  . Renal mass 07/12/2015  . Rheumatoid arthritis ([place]) 04/13/2012  . Shortness of breath dyspnea    with exertion  . Sleep apnea    CPAP   Trying to use  . Spinal stenosis of lumbar region with radiculopathy 08/07/2015  . Type 2 diabetes mellitus with other specified complication ([place]) 66/06/4032    Past Surgical History:  Procedure Laterality Date  . APPENDECTOMY    . BIOPSY  01/23/2019   Procedure: BIOPSY;  Surgeon: Otis Brace, MD;  [facility]: [facility];  Service: Gastroenterology;;  . [place] 2000   left  . CATARACT EXTRACTION W/PHACO Right 11/28/2014   Procedure: CATARACT EXTRACTION PHACO AND INTRAOCULAR LENS PLACEMENT (IOC) RIGHT ;  Surgeon: Marylynn Pearson, MD;  Location: [place] [facility];  Service: Ophthalmology;  Laterality: Right;  . CORONARY ARTERY BYPASS GRAFT  03/17/1995   Wynonia Lawman; followed every six months.  . ESOPHAGOGASTRODUODENOSCOPY (EGD) WITH PROPOFOL Left 01/23/2019   Procedure: ESOPHAGOGASTRODUODENOSCOPY (EGD) WITH PROPOFOL;  Surgeon: Otis Brace, MD;  [facility];  Service: Gastroenterology;  Laterality: Left;  . HERNIA REPAIR    . LUMBAR LAMINECTOMY/DECOMPRESSION MICRODISCECTOMY N/A 07/30/2015   Procedure: LUMBAR LAMINECTOMY DISCECTOMY ;  Surgeon: Ashok Pall, MD;  Location: [facility];  Service: Neurosurgery;  Laterality: N/A;  LUMBAR  LAMINECTOMY DISCECTOMY   . LUMBAR LAMINECTOMY/DECOMPRESSION MICRODISCECTOMY N/A 09/06/2015   Procedure: Redo L3/4 Disectomy;  Surgeon: Ashok Pall, MD;  Location: [facility];  Service: Neurosurgery;  Laterality: N/A;  . PROSTATE SURGERY     TURP at [place].  Marland Kitchen TRANSURETHRAL RESECTION OF PROSTATE      There were no vitals filed for this visit.   Subjective Assessment - 12/21/19 1222    Subjective I was sick for a couple of days.  My wife is still sick.  My knees got so weak on Sunday I could hardly walk.  I have an appt with the neurologist but it's not until December.    Pertinent History had HHPT 2 months ago after hospitalized 5 days related to kidney    How long can you stand comfortably? sits in chair in shower with wife's assist    How long can you walk comfortably? no where comfortably, need RW to walk apartment to car 100 feet    Patient Stated Goals walk better, get around better    Currently in Pain? Yes    Pain Score 5     Pain Location Back    Pain Orientation Lower    Pain Type Chronic pain    Aggravating Factors  walking, standing, sidelying                             [facility] Adult PT Treatment/Exercise - 12/21/19 0001      Lumbar Exercises: Seated   Sit to Stand Limitations bicep 3# 10x right/left     Other Seated Lumbar Exercises blue band vertical rows 10x    Other Seated Lumbar Exercises blue band horizontal rows 10x      Knee/Hip Exercises: Aerobic   Nustep L1 8 min    while discussing progress     Knee/Hip Exercises: Standing   Heel Raises Right;Left;10 reps    Other Standing Knee Exercises 15 sec each side to side weight shift, staggered stand weight shift to each side 15 sec each       Knee/Hip Exercises: Seated   Clamshell with TheraBand Blue   2x10 black loop    Knee/Hip Flexion 3# left only 10x     Other Seated Knee/Hip Exercises heel raise with 5# resting on knee 10x right/left     Sit to Sand 5 reps   UE assist     Shoulder  Exercises: Seated   Extension Strengthening;Both;10 reps;20 reps    Theraband Level (Shoulder Extension) Level 4 (Blue)    Diagonals Limitations 5# chops 10x right/left     Other Seated Exercises 3# weight punches 10x right/left                     PT Short Term Goals - 12/12/19 2140      PT SHORT TERM GOAL #1   Title Pt will  be independent with his initial HEP to improve flexibility, strength and balance.     Status Partially Met      PT SHORT TERM GOAL #2   Title perform 5x sit to stand in < or = to 25 seconds with UE support to improve balance    Status Achieved      PT SHORT TERM GOAL #3   Title Timed up and Go time improved to 29 sec or less    Status Achieved      PT SHORT TERM GOAL #4   Title The patient will be able to walk 200 feet with RW needed for improved short distance community mobility    Status Deferred             PT Long Term Goals - 12/12/19 2141      PT LONG TERM GOAL #1   Title Pt will demo independence with his advanced HEP to futher promote strength gains after d/c from PT.    Time 8    Period Weeks    Status On-going    Target Date 02/06/20      PT LONG TERM GOAL #2   Title Pt will demo improved functional strength and power of the LEs evident by his ability to complete 5x/sit to stand in less than 22 sec with moderate UE use.    Time 8    Status Achieved      PT LONG TERM GOAL #3   Title The patient will be able to stand for 30 sec with minimal UE support without loss of balance needed for personal care/grooming    Time 8    Period Weeks    Status Revised      PT LONG TERM GOAL #4   Title BERG balance score improved to 17/56    Time 8    Period Weeks    Status Revised      PT LONG TERM GOAL #5   Title Timed up and go improved to 22 sec indicating improved gait speed and safety    Time 8    Period Weeks                 Plan - 12/21/19 1308    Clinical Impression Statement The patient reports continued LE  weakness, right worse than left, which is unchanging and maybe a little worse the last few days.  Decreased exercise intensity today secondary to a short gap in care/his recent illness.  He is more limited in standing time today secondary to fatigue and weakness rather than LBP as was the issue in the past.  Despite moderate cues to stand erect and look up, he progressively leans more and more forward within 10-15 sec of being on his feet.  Therapist providing close supervision for safety due to high risk for falls.  His daughter brought him to PT today and assists him in/out of the car.    Comorbidities COPD; peripheral neuropathy; renal failure; diabetes; heart disease;  chronic LBP    Examination-Activity Limitations Bathing;Locomotion Level;Transfers;Bend;Carry;Dressing;Lift;Stand;Hygiene/Grooming    Examination-Participation Restrictions Community Activity;Interpersonal Relationship;Other;Meal Prep    Rehab Potential Good    PT Frequency 2x / week    PT Duration 12 weeks    PT Treatment/Interventions ADLs/Self Care Home Management;Moist Heat;Neuromuscular re-education;Balance training;Therapeutic exercise;Therapeutic activities;Functional mobility training;Gait training;Patient/family education;Manual techniques;Dry needling    PT Next Visit Plan KX now;  Nu-Step; seated and standing general strengthening low level;  awaiting  VA authorization for more  visits           Patient will benefit from skilled therapeutic intervention in order to improve the following deficits and impairments:  Difficulty walking, Decreased endurance, Decreased activity tolerance, Impaired perceived functional ability, Pain, Decreased balance, Decreased mobility, Decreased strength, Postural dysfunction  Visit Diagnosis: Unsteadiness on feet  Muscle weakness (generalized)  Chronic low back pain, unspecified back pain laterality, unspecified whether sciatica present  Other abnormalities of gait and  mobility     Problem List Patient Active Problem List   Diagnosis Date Noted  . Sleep apnea   . Polymyalgia rheumatica ([place])   . Peripheral neuropathy   . Obesity   . Lumbar disc disease   . Hypertension   . Hyperlipidemia   . Hepatitis   . Gout   . Diabetes mellitus without complication ([place])   . Coronary artery disease   . Chronic kidney disease   . CHF (congestive heart failure) ([place])   . Cataract   . Cancer of kidney ([place])   . BPH (benign prostatic hyperplasia)   . Asthma   . Arthritis   . Anxiety   . Anemia   . Allergy   . Other chronic pain   . Intractable nausea and vomiting 01/19/2019  . Intractable vomiting   . Hypokalemia   . GERD (gastroesophageal reflux disease) 11/07/2018  . History of CHF (congestive heart failure)   . Facial cellulitis   . Acute on chronic congestive heart failure ([place])   . Gait abnormality 11/02/2018  . Cellulitis 11/01/2018  . Paroxysmal atrial fibrillation ([place]) 08/22/2018  . High risk medication use 08/22/2018  . Chronic anticoagulation 08/22/2018  . On amiodarone therapy 08/22/2018  . Lumbar radiculopathy 05/05/2018  . Hypertensive heart disease 04/24/2018  . CKD (chronic kidney disease) 11/27/2015  . Chronic diastolic heart failure ([place]) 11/27/2015  . Normocytic anemia 11/27/2015  . AKI (acute kidney injury) ([place])   . Acute renal failure superimposed on stage 4 chronic kidney disease ([place]) 10/02/2015  . Hypotension 10/02/2015  . Anorexia   . Adjustment disorder with mixed emotional features 09/01/2015  . Acute encephalopathy 08/29/2015  . Elevated troponin 08/29/2015  . Renal cancer ([place]) 08/28/2015  . Spinal stenosis of lumbar region with radiculopathy 08/07/2015  . COPD (chronic obstructive pulmonary disease) ([place])   . OSA (obstructive sleep apnea) 07/27/2015  . HNP (herniated nucleus pulposus), lumbar 07/24/2015  . Renal cell carcinoma ([place]) 07/23/2015  . Essential hypertension 07/12/2015  . Renal mass 07/12/2015   . PMR (polymyalgia rheumatica) ([place]) 09/09/2013  . Rheumatoid arthritis ([place]) 04/13/2012  . CAD (coronary artery disease) 12/19/2011  . Type 2 diabetes mellitus with other specified complication ([place]) 71/24/5809  . Neuropathy ([place]) 12/19/2011   Ruben Im, PT 12/21/19 1:16 PM Phone: [phone] Fax: [phone] Alvera Singh 12/21/2019, 1:16 PM  [facility] [address], [place], [place], [address] Phone: [phone]   Fax:  [phone]  Name: Jacob [name] MRN: [id number] Date of Birth: 03-Jan-1941

## 2019-12-25 ENCOUNTER — Ambulatory Visit: Payer: No Typology Code available for payment source | Admitting: Physical Therapy

## 2020-01-01 ENCOUNTER — Encounter: Payer: Self-pay | Admitting: Physical Therapy

## 2020-01-01 ENCOUNTER — Ambulatory Visit: Payer: No Typology Code available for payment source | Admitting: Physical Therapy

## 2020-01-01 ENCOUNTER — Other Ambulatory Visit: Payer: Self-pay

## 2020-01-01 DIAGNOSIS — R2681 Unsteadiness on feet: Secondary | ICD-10-CM | POA: Diagnosis not present

## 2020-01-01 DIAGNOSIS — M545 Low back pain, unspecified: Secondary | ICD-10-CM

## 2020-01-01 DIAGNOSIS — R2689 Other abnormalities of gait and mobility: Secondary | ICD-10-CM

## 2020-01-01 DIAGNOSIS — M6281 Muscle weakness (generalized): Secondary | ICD-10-CM

## 2020-01-01 DIAGNOSIS — G8929 Other chronic pain: Secondary | ICD-10-CM

## 2020-01-01 NOTE — Therapy (Signed)
[facility] [address] [place], [place], [address] Phone: [phone]   Fax:  [phone]  Physical Therapy Treatment  Patient Details  Name: Jacob [name] MRN: [id number] Date of Birth: 1940/11/17 Referring Provider (PT): Dr. Elberta Spaniel    Encounter Date: 01/01/2020   PT End of Session - 01/01/20 1016    Visit Number 16    Number of Visits 30    Date for PT Re-Evaluation 02/06/20    Authorization Type VA 15 additional visits 12/27/19-04/25/2020;  will submit for additional visits    PT Start Time 1014   limited by pain   PT Stop Time 1045    PT Time Calculation (min) 31 min    Activity Tolerance Patient limited by pain    Behavior During Therapy [facility] for tasks assessed/performed           Past Medical History:  Diagnosis Date  . Acute encephalopathy 08/29/2015  . Acute on chronic congestive heart failure ([place])   . Acute renal failure superimposed on stage 4 chronic kidney disease ([place]) 10/02/2015  . Adjustment disorder with mixed emotional features 09/01/2015  . AKI (acute kidney injury) ([place])   . Allergy   . Anemia   . Anxiety   . Arthritis   . Asthma   . BPH (benign prostatic hyperplasia)   . CAD (coronary artery disease) 12/19/2011  . Cancer of kidney ([place])   . Cataract   . CHF (congestive heart failure) ([place])   . Chronic anticoagulation 08/22/2018  . Chronic diastolic heart failure ([place]) 11/27/2015  . Chronic kidney disease    chronic  kidney failure  kidney function at 42%  . CKD (chronic kidney disease) 11/27/2015  . COPD (chronic obstructive pulmonary disease) ([place])   . Coronary artery disease    CABG  7 bypasses  . Dehydration   . Diabetes mellitus without complication ([place])   . Elevated troponin 08/29/2015  . Essential hypertension 07/12/2015  . Facial cellulitis   . GERD (gastroesophageal reflux disease)   . Gout   . Hepatitis    many years ago  . High risk medication use 08/22/2018  . History  of CHF (congestive heart failure)   . HNP (herniated nucleus pulposus), lumbar 07/24/2015  . Hyperlipidemia   . Hypertension   . Hypertensive heart disease 04/24/2018  . Hypokalemia   . Hypotension 10/02/2015  . Lumbar disc disease   . Lumbar radiculopathy 05/05/2018  . Neuropathy ([place]) 12/19/2011  . Normocytic anemia 11/27/2015  . Obesity   . On amiodarone therapy 08/22/2018  . OSA (obstructive sleep apnea) 07/27/2015  . Paroxysmal atrial fibrillation (HCC)   . Peripheral neuropathy   . PMR (polymyalgia rheumatica) ([place]) 09/09/2013  . Polymyalgia rheumatica (HCC)    maintained on Prednisone, Plaquenil. Followed by rhuematology every 4 months/[name].  . Renal cell carcinoma ([place]) 07/23/2015  . Renal mass 07/12/2015  . Rheumatoid arthritis ([place]) 04/13/2012  . Shortness of breath dyspnea    with exertion  . Sleep apnea    CPAP   Trying to use  . Spinal stenosis of lumbar region with radiculopathy 08/07/2015  . Type 2 diabetes mellitus with other specified complication ([place]) 34/09/4257    Past Surgical History:  Procedure Laterality Date  . APPENDECTOMY    . BIOPSY  01/23/2019   Procedure: BIOPSY;  Surgeon: Otis Brace, MD;  [facility];  Service: Gastroenterology;;  . [place] 2000   left  . CATARACT EXTRACTION W/PHACO  Right 11/28/2014   Procedure: CATARACT EXTRACTION PHACO AND INTRAOCULAR LENS PLACEMENT (IOC) RIGHT ;  Surgeon: Marylynn Pearson, MD;  Location: [place] [facility];  Service: Ophthalmology;  Laterality: Right;  . CORONARY ARTERY BYPASS GRAFT  03/17/1995   Wynonia Lawman; followed every six months.  . ESOPHAGOGASTRODUODENOSCOPY (EGD) WITH PROPOFOL Left 01/23/2019   Procedure: ESOPHAGOGASTRODUODENOSCOPY (EGD) WITH PROPOFOL;  Surgeon: Otis Brace, MD;  [facility];  Service: Gastroenterology;  Laterality: Left;  . HERNIA REPAIR    . LUMBAR LAMINECTOMY/DECOMPRESSION MICRODISCECTOMY N/A 07/30/2015   Procedure: LUMBAR LAMINECTOMY DISCECTOMY ;  Surgeon: Ashok Pall, MD;  Location: [facility];  Service: Neurosurgery;  Laterality: N/A;  LUMBAR LAMINECTOMY DISCECTOMY   . LUMBAR LAMINECTOMY/DECOMPRESSION MICRODISCECTOMY N/A 09/06/2015   Procedure: Redo L3/4 Disectomy;  Surgeon: Ashok Pall, MD;  Location: [facility];  Service: Neurosurgery;  Laterality: N/A;  . PROSTATE SURGERY     TURP at [place].  Marland Kitchen TRANSURETHRAL RESECTION OF PROSTATE      There were no vitals filed for this visit.   Subjective Assessment - 01/01/20 1017    Subjective i have not had a good week. I feel like a knife is stabbing me in my Rt hip.    Currently in Pain? Yes    Pain Location Hip    Pain Orientation Right    Pain Descriptors / Indicators Stabbing    Multiple Pain Sites No                             [facility] Adult PT Treatment/Exercise - 01/01/20 0001      Lumbar Exercises: Seated   Sit to Stand Limitations bicep 5# 10x right/left     Other Seated Lumbar Exercises 5# wt shoulder to shoulder 10x    Other Seated Lumbar Exercises green band 10x      Knee/Hip Exercises: Aerobic   Nustep L2 x 6 min: stopped d/t hip pain      Knee/Hip Exercises: Standing   Heel Raises Right;Left;10 reps    Other Standing Knee Exercises 10 sec each side to side weight shift, staggered stand weight shift to each side 15 sec each    reduced reps d/t pain levels     Knee/Hip Exercises: Seated   Long Arc Quad AROM;Both;1 set;10 reps    Clamshell with TheraBand --   black loop 2x10   Knee/Hip Flexion 0# 10x, 3# 10x    Other Seated Knee/Hip Exercises heel & toe raise with 5# resting on knee 10x right/left     Sit to Sand 5 reps;with UE support   PTA had to hold walker down.      Shoulder Exercises: Seated   Row Strengthening;Both;20 reps;Theraband    Theraband Level (Shoulder Row) Level 4 (Blue)      Moist Heat Therapy   Moist Heat Location --   lumbar when seated                   PT Short Term Goals - 12/12/19 2140      PT SHORT TERM GOAL #1    Title Pt will be independent with his initial HEP to improve flexibility, strength and balance.     Status Partially Met      PT SHORT TERM GOAL #2   Title perform 5x sit to stand in < or = to 25 seconds with UE support to improve balance    Status Achieved      PT SHORT  TERM GOAL #3   Title Timed up and Go time improved to 29 sec or less    Status Achieved      PT SHORT TERM GOAL #4   Title The patient will be able to walk 200 feet with RW needed for improved short distance community mobility    Status Deferred             PT Long Term Goals - 12/12/19 2141      PT LONG TERM GOAL #1   Title Pt will demo independence with his advanced HEP to futher promote strength gains after d/c from PT.    Time 8    Period Weeks    Status On-going    Target Date 02/06/20      PT LONG TERM GOAL #2   Title Pt will demo improved functional strength and power of the LEs evident by his ability to complete 5x/sit to stand in less than 22 sec with moderate UE use.    Time 8    Status Achieved      PT LONG TERM GOAL #3   Title The patient will be able to stand for 30 sec with minimal UE support without loss of balance needed for personal care/grooming    Time 8    Period Weeks    Status Revised      PT LONG TERM GOAL #4   Title BERG balance score improved to 17/56    Time 8    Period Weeks    Status Revised      PT LONG TERM GOAL #5   Title Timed up and go improved to 22 sec indicating improved gait speed and safety    Time 8    Period Weeks                 Plan - 01/01/20 1016    Clinical Impression Statement Pt arrives with hip pian that initially limited some exercises like the Nustep, but this reduced some to aloow for pt to participate in mostly seated exercises. Hip pain almost abolished near end of sesison but pt had increased back pain at end of session.    Personal Factors and Comorbidities Age;Fitness;Comorbidity 1;Comorbidity 2;Comorbidity 3+;Time since onset of  injury/illness/exacerbation    Comorbidities COPD; peripheral neuropathy; renal failure; diabetes; heart disease;  chronic LBP    Examination-Activity Limitations Bathing;Locomotion Level;Transfers;Bend;Carry;Dressing;Lift;Stand;Hygiene/Grooming    Examination-Participation Restrictions Community Activity;Interpersonal Relationship;Other;Meal Prep    Stability/Clinical Decision Making Evolving/Moderate complexity    Rehab Potential Good    PT Frequency 2x / week    PT Duration 12 weeks    PT Treatment/Interventions ADLs/Self Care Home Management;Moist Heat;Neuromuscular re-education;Balance training;Therapeutic exercise;Therapeutic activities;Functional mobility training;Gait training;Patient/family education;Manual techniques;Dry needling    PT Next Visit Plan KX now;  Nu-Step; seated and standing general strengthening low level;    Consulted and Agree with Plan of Care Patient           Patient will benefit from skilled therapeutic intervention in order to improve the following deficits and impairments:  Difficulty walking, Decreased endurance, Decreased activity tolerance, Impaired perceived functional ability, Pain, Decreased balance, Decreased mobility, Decreased strength, Postural dysfunction  Visit Diagnosis: Muscle weakness (generalized)  Unsteadiness on feet  Chronic low back pain, unspecified back pain laterality, unspecified whether sciatica present  Other abnormalities of gait and mobility     Problem List Patient Active Problem List   Diagnosis Date Noted  . Sleep apnea   . Polymyalgia rheumatica ([place])   .  Peripheral neuropathy   . Obesity   . Lumbar disc disease   . Hypertension   . Hyperlipidemia   . Hepatitis   . Gout   . Diabetes mellitus without complication ([place])   . Coronary artery disease   . Chronic kidney disease   . CHF (congestive heart failure) ([place])   . Cataract   . Cancer of kidney ([place])   . BPH (benign prostatic hyperplasia)   . Asthma    . Arthritis   . Anxiety   . Anemia   . Allergy   . Other chronic pain   . Intractable nausea and vomiting 01/19/2019  . Intractable vomiting   . Hypokalemia   . GERD (gastroesophageal reflux disease) 11/07/2018  . History of CHF (congestive heart failure)   . Facial cellulitis   . Acute on chronic congestive heart failure ([place])   . Gait abnormality 11/02/2018  . Cellulitis 11/01/2018  . Paroxysmal atrial fibrillation ([place]) 08/22/2018  . High risk medication use 08/22/2018  . Chronic anticoagulation 08/22/2018  . On amiodarone therapy 08/22/2018  . Lumbar radiculopathy 05/05/2018  . Hypertensive heart disease 04/24/2018  . CKD (chronic kidney disease) 11/27/2015  . Chronic diastolic heart failure ([place]) 11/27/2015  . Normocytic anemia 11/27/2015  . AKI (acute kidney injury) ([place])   . Acute renal failure superimposed on stage 4 chronic kidney disease ([place]) 10/02/2015  . Hypotension 10/02/2015  . Anorexia   . Adjustment disorder with mixed emotional features 09/01/2015  . Acute encephalopathy 08/29/2015  . Elevated troponin 08/29/2015  . Renal cancer ([place]) 08/28/2015  . Spinal stenosis of lumbar region with radiculopathy 08/07/2015  . COPD (chronic obstructive pulmonary disease) ([place])   . OSA (obstructive sleep apnea) 07/27/2015  . HNP (herniated nucleus pulposus), lumbar 07/24/2015  . Renal cell carcinoma ([place]) 07/23/2015  . Essential hypertension 07/12/2015  . Renal mass 07/12/2015  . PMR (polymyalgia rheumatica) ([place]) 09/09/2013  . Rheumatoid arthritis ([place]) 04/13/2012  . CAD (coronary artery disease) 12/19/2011  . Type 2 diabetes mellitus with other specified complication ([place]) 97/94/8016  . Neuropathy ([place]) 12/19/2011    ,, PTA 01/01/2020, 10:53 AM  [facility] [address], [place], [place], [address] Phone: [phone]   Fax:  [phone]  Name: Jacob [name] MRN: [id number] Date  of Birth: 08/14/1940

## 2020-01-11 ENCOUNTER — Ambulatory Visit (INDEPENDENT_AMBULATORY_CARE_PROVIDER_SITE_OTHER): Payer: PPO | Admitting: Podiatry

## 2020-01-11 ENCOUNTER — Other Ambulatory Visit: Payer: Self-pay

## 2020-01-11 ENCOUNTER — Ambulatory Visit (INDEPENDENT_AMBULATORY_CARE_PROVIDER_SITE_OTHER): Payer: PPO

## 2020-01-11 DIAGNOSIS — G63 Polyneuropathy in diseases classified elsewhere: Secondary | ICD-10-CM | POA: Diagnosis not present

## 2020-01-11 DIAGNOSIS — M79675 Pain in left toe(s): Secondary | ICD-10-CM | POA: Diagnosis not present

## 2020-01-11 DIAGNOSIS — M79672 Pain in left foot: Secondary | ICD-10-CM | POA: Diagnosis not present

## 2020-01-11 DIAGNOSIS — E1142 Type 2 diabetes mellitus with diabetic polyneuropathy: Secondary | ICD-10-CM

## 2020-01-11 DIAGNOSIS — B351 Tinea unguium: Secondary | ICD-10-CM | POA: Diagnosis not present

## 2020-01-11 DIAGNOSIS — M79674 Pain in right toe(s): Secondary | ICD-10-CM

## 2020-01-11 DIAGNOSIS — S90122A Contusion of left lesser toe(s) without damage to nail, initial encounter: Secondary | ICD-10-CM

## 2020-01-14 NOTE — Progress Notes (Signed)
  Subjective:  Patient ID: Jacob [name], male    DOB: 1940/11/27,  MRN: [id number]  Chief Complaint  Patient presents with  . routine foot care    diabetic foot care, nail trim     79 y.o. male presents with the above complaint. History confirmed with patient.  Here with his wife today.  He has long elongated thickened and painful toenails which he is unable to cut.  He also stubbed the fourth and fifth toe on the left foot.  Objective:  Physical Exam: warm, good capillary refill, no trophic changes or ulcerative lesions, normal DP and PT pulses and abnormal sensory exam with loss of protective sensation distally.  Onychomycosis x10.  Dry skin noted.  Mild tenderness to the fifth toe to palpation and range of motion   Radiographs: X-ray of the left foot: No fracture or dislocation is noted of the digits  Assessment:   1. Foot pain, left      Plan:  Patient was evaluated and treated and all questions answered.   Patient educated on diabetes. Discussed proper diabetic foot care and discussed risks and complications of disease. Educated patient in depth on reasons to return to the office immediately should he/she discover anything concerning or new on the feet. All questions answered. Discussed proper shoes as well.   Discussed the etiology and treatment options for the condition in detail with the patient. Educated patient on the topical and oral treatment options for mycotic nails. Recommended debridement of the nails today. Sharp and mechanical debridement performed of all painful and mycotic nails today. Nails debrided in length and thickness using a nail nipper and a mechanical burr to level of comfort. Discussed treatment options including appropriate shoe gear. Follow up as needed for painful nails.  He has contusion of the toes of the left foot without fracture.  Advised to continue wearing shoe gear as tolerated and it may take a while to heal up.  No follow-ups on file.

## 2020-01-15 ENCOUNTER — Other Ambulatory Visit: Payer: Self-pay

## 2020-01-15 ENCOUNTER — Ambulatory Visit: Payer: No Typology Code available for payment source | Attending: Internal Medicine | Admitting: Physical Therapy

## 2020-01-15 ENCOUNTER — Encounter: Payer: Self-pay | Admitting: Physical Therapy

## 2020-01-15 ENCOUNTER — Other Ambulatory Visit: Payer: Self-pay | Admitting: Podiatry

## 2020-01-15 DIAGNOSIS — R2689 Other abnormalities of gait and mobility: Secondary | ICD-10-CM | POA: Diagnosis not present

## 2020-01-15 DIAGNOSIS — S90122A Contusion of left lesser toe(s) without damage to nail, initial encounter: Secondary | ICD-10-CM

## 2020-01-15 DIAGNOSIS — M545 Low back pain, unspecified: Secondary | ICD-10-CM | POA: Diagnosis not present

## 2020-01-15 DIAGNOSIS — M6281 Muscle weakness (generalized): Secondary | ICD-10-CM | POA: Diagnosis not present

## 2020-01-15 DIAGNOSIS — G8929 Other chronic pain: Secondary | ICD-10-CM | POA: Diagnosis not present

## 2020-01-15 DIAGNOSIS — R2681 Unsteadiness on feet: Secondary | ICD-10-CM | POA: Diagnosis not present

## 2020-01-15 NOTE — Therapy (Signed)
[facility] [address], [place], [place], [address] Phone: [phone]   Fax:  [phone]  Physical Therapy Treatment  Patient Details  Name: Jacob [name] MRN: [id number] Date of Birth: 22-Apr-1940 Referring Provider (PT): Dr. Elberta Spaniel    Encounter Date: 01/15/2020   PT End of Session - 01/15/20 1149    Visit Number 17    Number of Visits 30    Date for PT Re-Evaluation 02/06/20    Authorization Type [facility] 15 additional visits 12/27/19-04/25/2020;  will submit for additional visits    PT Start Time 1145    PT Stop Time 1216    PT Time Calculation (min) 31 min    Activity Tolerance Patient tolerated treatment well;Patient limited by pain;Patient limited by fatigue    Behavior During Therapy [facility] for tasks assessed/performed           Past Medical History:  Diagnosis Date  . Acute encephalopathy 08/29/2015  . Acute on chronic congestive heart failure ([place])   . Acute renal failure superimposed on stage 4 chronic kidney disease ([place]) 10/02/2015  . Adjustment disorder with mixed emotional features 09/01/2015  . AKI (acute kidney injury) ([place])   . Allergy   . Anemia   . Anxiety   . Arthritis   . Asthma   . BPH (benign prostatic hyperplasia)   . CAD (coronary artery disease) 12/19/2011  . Cancer of kidney ([place])   . Cataract   . CHF (congestive heart failure) ([place])   . Chronic anticoagulation 08/22/2018  . Chronic diastolic heart failure ([place]) 11/27/2015  . Chronic kidney disease    chronic  kidney failure  kidney function at 42%  . CKD (chronic kidney disease) 11/27/2015  . COPD (chronic obstructive pulmonary disease) ([place])   . Coronary artery disease    CABG  7 bypasses  . Dehydration   . Diabetes mellitus without complication ([place])   . Elevated troponin 08/29/2015  . Essential hypertension 07/12/2015  . Facial cellulitis   . GERD (gastroesophageal reflux disease)   . Gout   . Hepatitis    many years ago  . High  risk medication use 08/22/2018  . History of CHF (congestive heart failure)   . HNP (herniated nucleus pulposus), lumbar 07/24/2015  . Hyperlipidemia   . Hypertension   . Hypertensive heart disease 04/24/2018  . Hypokalemia   . Hypotension 10/02/2015  . Lumbar disc disease   . Lumbar radiculopathy 05/05/2018  . Neuropathy ([place]) 12/19/2011  . Normocytic anemia 11/27/2015  . Obesity   . On amiodarone therapy 08/22/2018  . OSA (obstructive sleep apnea) 07/27/2015  . Paroxysmal atrial fibrillation (HCC)   . Peripheral neuropathy   . PMR (polymyalgia rheumatica) ([place]) 09/09/2013  . Polymyalgia rheumatica (HCC)    maintained on Prednisone, Plaquenil. Followed by rhuematology every 4 months/[name].  . Renal cell carcinoma ([place]) 07/23/2015  . Renal mass 07/12/2015  . Rheumatoid arthritis ([place]) 04/13/2012  . Shortness of breath dyspnea    with exertion  . Sleep apnea    CPAP   Trying to use  . Spinal stenosis of lumbar region with radiculopathy 08/07/2015  . Type 2 diabetes mellitus with other specified complication ([place]) 96/04/2295    Past Surgical History:  Procedure Laterality Date  . APPENDECTOMY    . BIOPSY  01/23/2019   Procedure: BIOPSY;  Surgeon: Otis Brace, MD;  Location: [facility];  Service: Gastroenterology;;  . [place] 2000   left  . CATARACT  EXTRACTION W/PHACO Right 11/28/2014   Procedure: CATARACT EXTRACTION PHACO AND INTRAOCULAR LENS PLACEMENT (IOC) RIGHT ;  Surgeon: Roy Whitaker, MD;  Location: [facility] OR;  Service: Ophthalmology;  Laterality: Right;  . CORONARY ARTERY BYPASS GRAFT  03/17/1995   [place]; followed every six months.  . ESOPHAGOGASTRODUODENOSCOPY (EGD) WITH PROPOFOL Left 01/23/2019   Procedure: ESOPHAGOGASTRODUODENOSCOPY (EGD) WITH PROPOFOL;  Surgeon: Brahmbhatt, Parag, MD;  Location: [facility];  Service: Gastroenterology;  Laterality: Left;  . HERNIA REPAIR    . LUMBAR LAMINECTOMY/DECOMPRESSION MICRODISCECTOMY N/A 07/30/2015   Procedure: LUMBAR  LAMINECTOMY DISCECTOMY ;  Surgeon: Kyle Cabbell, MD;  Location: [facility];  Service: Neurosurgery;  Laterality: N/A;  LUMBAR LAMINECTOMY DISCECTOMY   . LUMBAR LAMINECTOMY/DECOMPRESSION MICRODISCECTOMY N/A 09/06/2015   Procedure: Redo L3/4 Disectomy;  Surgeon: Kyle Cabbell, MD;  Location: [facility];  Service: Neurosurgery;  Laterality: N/A;  . PROSTATE SURG[facility]     [facility]  . TRANSURETHRAL RESECTION OF PROSTATE      There were no vitals filed for this visit.   Subjective Assessment - 01/15/20 1151    Subjective Pain can change/fluctuate between UE/LE on a day to day basis.    Pertinent History had HHPT 2 months ago after hospitalized 5 days related to kidney    Currently in Pain? Yes    Pain Score 5     Pain Location Hip    Pain Orientation Right    Pain Descriptors / Indicators Stabbing    Aggravating Factors  Walking/standing    Pain Relieving Factors Not much, usually sitting helps                             [facility] Adult PT Treatment/Exercise - 01/15/20 0001      Knee/Hip Exercises: Aerobic   Nustep L2 x 8 min:      Knee/Hip Exercises: Standing   Heel Raises Both;1 set;15 reps    Heel Raises Limitations Heavy use of UE    Other Standing Knee Exercises 10 sec each side to side weight shift, staggered stand weight shift to each side 15 sec each    reduced reps d/t pain levels   Other Standing Knee Exercises Small step forward hold 5 sec: 2x each side with medium UE support       Knee/Hip Exercises: Seated   Long Arc Quad Strengthening;Both;1 set;10 reps;Weights    Long Arc Quad Weight 3 lbs.    Clamshell with TheraBand --   black loop 2x10   Knee/Hip Flexion 0# 20x     Sit to Sand 5 reps;with UE support   PTA had to hold walker down.      Shoulder Exercises: Seated   Row Strengthening;Both;20 reps;Theraband    Theraband Level (Shoulder Row) Level 4 (Blue)    Diagonals Limitations 6# chops 10x right/left    6# was "heavy"    Other Seated Exercises  6# biceps curls 10x                     PT Short Term Goals - 12/12/19 2140      PT SHORT TERM GOAL #1   Title Pt will be independent with his initial HEP to improve flexibility, strength and balance.     Status Partially Met      PT SHORT TERM GOAL #2   Title perform 5x sit to stand in < or = to 25 seconds with UE support to improve balance      Status Achieved      PT SHORT TERM GOAL #3   Title Timed up and Go time improved to 29 sec or less    Status Achieved      PT SHORT TERM GOAL #4   Title The patient will be able to walk 200 feet with RW needed for improved short distance community mobility    Status Deferred             PT Long Term Goals - 12/12/19 2141      PT LONG TERM GOAL #1   Title Pt will demo independence with his advanced HEP to futher promote strength gains after d/c from PT.    Time 8    Period Weeks    Status On-going    Target Date 02/06/20      PT LONG TERM GOAL #2   Title Pt will demo improved functional strength and power of the LEs evident by his ability to complete 5x/sit to stand in less than 22 sec with moderate UE use.    Time 8    Status Achieved      PT LONG TERM GOAL #3   Title The patient will be able to stand for 30 sec with minimal UE support without loss of balance needed for personal care/grooming    Time 8    Period Weeks    Status Revised      PT LONG TERM GOAL #4   Title BERG balance score improved to 17/56    Time 8    Period Weeks    Status Revised      PT LONG TERM GOAL #5   Title Timed up and go improved to 22 sec indicating improved gait speed and safety    Time 8    Period Weeks                 Plan - 01/15/20 1150    Clinical Impression Statement Pt continues to struggle with really a global pain; at times he reports his upper extremities are very painful and tehn it will switch to his back and RT hip/leg. He has no results from his nerve conduction test. Today pt handled seated exercises  fairly well but had increasing discomfort in his RT hip with standing. Pt fatigued pretty significantly at about 30 min mark. Near fall getting coat on.    Personal Factors and Comorbidities Age;Fitness;Comorbidity 1;Comorbidity 2;Comorbidity 3+;Time since onset of injury/illness/exacerbation    Comorbidities COPD; peripheral neuropathy; renal failure; diabetes; heart disease;  chronic LBP    Examination-Activity Limitations Bathing;Locomotion Level;Transfers;Bend;Carry;Dressing;Lift;Stand;Hygiene/Grooming    Examination-Participation Restrictions Community Activity;Interpersonal Relationship;Other;Meal Prep    Stability/Clinical Decision Making Evolving/Moderate complexity    Rehab Potential Good    PT Frequency 2x / week    PT Duration 12 weeks    PT Treatment/Interventions ADLs/Self Care Home Management;Moist Heat;Neuromuscular re-education;Balance training;Therapeutic exercise;Therapeutic activities;Functional mobility training;Gait training;Patient/family education;Manual techniques;Dry needling    PT Next Visit Plan KX now;  Nu-Step; seated and standing general strengthening low level;    Consulted and Agree with Plan of Care Patient           Patient will benefit from skilled therapeutic intervention in order to improve the following deficits and impairments:  Difficulty walking, Decreased endurance, Decreased activity tolerance, Impaired perceived functional ability, Pain, Decreased balance, Decreased mobility, Decreased strength, Postural dysfunction  Visit Diagnosis: Muscle weakness (generalized)  Unsteadiness on feet  Chronic low back pain, unspecified back pain laterality, unspecified whether sciatica  present  Other abnormalities of gait and mobility     Problem List Patient Active Problem List   Diagnosis Date Noted  . Sleep apnea   . Polymyalgia rheumatica (HCC)   . Peripheral neuropathy   . Obesity   . Lumbar disc disease   . Hypertension   . Hyperlipidemia   .  Hepatitis   . Gout   . Diabetes mellitus without complication (HCC)   . Coronary artery disease   . Chronic kidney disease   . CHF (congestive heart failure) (HCC)   . Cataract   . Cancer of kidney (HCC)   . BPH (benign prostatic hyperplasia)   . Asthma   . Arthritis   . Anxiety   . Anemia   . Allergy   . Other chronic pain   . Intractable nausea and vomiting 01/19/2019  . Intractable vomiting   . Hypokalemia   . GERD (gastroesophageal reflux disease) 11/07/2018  . History of CHF (congestive heart failure)   . Facial cellulitis   . Acute on chronic congestive heart failure (HCC)   . Gait abnormality 11/02/2018  . Cellulitis 11/01/2018  . Paroxysmal atrial fibrillation (HCC) 08/22/2018  . High risk medication use 08/22/2018  . Chronic anticoagulation 08/22/2018  . On amiodarone therapy 08/22/2018  . Lumbar radiculopathy 05/05/2018  . Hypertensive heart disease 04/24/2018  . CKD (chronic kidney disease) 11/27/2015  . Chronic diastolic heart failure (HCC) 11/27/2015  . Normocytic anemia 11/27/2015  . AKI (acute kidney injury) (HCC)   . Acute renal failure superimposed on stage 4 chronic kidney disease (HCC) 10/02/2015  . Hypotension 10/02/2015  . Anorexia   . Adjustment disorder with mixed emotional features 09/01/2015  . Acute encephalopathy 08/29/2015  . Elevated troponin 08/29/2015  . Renal cancer (HCC) 08/28/2015  . Spinal stenosis of lumbar region with radiculopathy 08/07/2015  . COPD (chronic obstructive pulmonary disease) (HCC)   . OSA (obstructive sleep apnea) 07/27/2015  . HNP (herniated nucleus pulposus), lumbar 07/24/2015  . Renal cell carcinoma (HCC) 07/23/2015  . Essential hypertension 07/12/2015  . Renal mass 07/12/2015  . PMR (polymyalgia rheumatica) (HCC) 09/09/2013  . Rheumatoid arthritis (HCC) 04/13/2012  . CAD (coronary artery disease) 12/19/2011  . Type 2 diabetes mellitus with other specified complication (HCC) 12/19/2011  . Neuropathy (HCC)  12/19/2011    ,, PTA 01/15/2020, 12:30 PM  [facility] [address], [address] [place], [place], [address] Phone: [phone]   Fax:  [phone]  Name: Saad C [place] MRN: [id number] Date of Birth: 03/05/1941   

## 2020-01-17 ENCOUNTER — Ambulatory Visit: Payer: No Typology Code available for payment source | Admitting: Physical Therapy

## 2020-01-17 ENCOUNTER — Other Ambulatory Visit: Payer: Self-pay

## 2020-01-17 ENCOUNTER — Encounter: Payer: Self-pay | Admitting: Physical Therapy

## 2020-01-17 DIAGNOSIS — M545 Low back pain, unspecified: Secondary | ICD-10-CM

## 2020-01-17 DIAGNOSIS — R2689 Other abnormalities of gait and mobility: Secondary | ICD-10-CM

## 2020-01-17 DIAGNOSIS — R2681 Unsteadiness on feet: Secondary | ICD-10-CM

## 2020-01-17 DIAGNOSIS — M6281 Muscle weakness (generalized): Secondary | ICD-10-CM

## 2020-01-17 DIAGNOSIS — G8929 Other chronic pain: Secondary | ICD-10-CM

## 2020-01-17 NOTE — Therapy (Signed)
[facility] [address], [place], [place], [address] Phone: [phone]   Fax:  [phone]  Physical Therapy Treatment  Patient Details  Name: Jacob [name] MRN: [id number] Date of Birth: 1940-07-15 Referring Provider (PT): Dr. Elberta Spaniel    Encounter Date: 01/17/2020   PT End of Session - 01/17/20 1205    Visit Number 18    Number of Visits 30    Date for PT Re-Evaluation 02/06/20    Authorization Type VA 15 additional visits 12/27/19-04/25/2020;  will submit for additional visits    PT Start Time 1142    PT Stop Time 1212    PT Time Calculation (min) 30 min    Activity Tolerance Patient tolerated treatment well;Patient limited by pain;Patient limited by fatigue    Behavior During Therapy [facility] for tasks assessed/performed           Past Medical History:  Diagnosis Date  . Acute encephalopathy 08/29/2015  . Acute on chronic congestive heart failure ([place])   . Acute renal failure superimposed on stage 4 chronic kidney disease ([place]) 10/02/2015  . Adjustment disorder with mixed emotional features 09/01/2015  . AKI (acute kidney injury) ([place])   . Allergy   . Anemia   . Anxiety   . Arthritis   . Asthma   . BPH (benign prostatic hyperplasia)   . CAD (coronary artery disease) 12/19/2011  . Cancer of kidney ([place])   . Cataract   . CHF (congestive heart failure) ([place])   . Chronic anticoagulation 08/22/2018  . Chronic diastolic heart failure ([place]) 11/27/2015  . Chronic kidney disease    chronic  kidney failure  kidney function at 42%  . CKD (chronic kidney disease) 11/27/2015  . COPD (chronic obstructive pulmonary disease) ([place])   . Coronary artery disease    CABG  7 bypasses  . Dehydration   . Diabetes mellitus without complication ([place])   . Elevated troponin 08/29/2015  . Essential hypertension 07/12/2015  . Facial cellulitis   . GERD (gastroesophageal reflux disease)   . Gout   . Hepatitis    many years ago  . High  risk medication use 08/22/2018  . History of CHF (congestive heart failure)   . HNP (herniated nucleus pulposus), lumbar 07/24/2015  . Hyperlipidemia   . Hypertension   . Hypertensive heart disease 04/24/2018  . Hypokalemia   . Hypotension 10/02/2015  . Lumbar disc disease   . Lumbar radiculopathy 05/05/2018  . Neuropathy ([place]) 12/19/2011  . Normocytic anemia 11/27/2015  . Obesity   . On amiodarone therapy 08/22/2018  . OSA (obstructive sleep apnea) 07/27/2015  . Paroxysmal atrial fibrillation (HCC)   . Peripheral neuropathy   . PMR (polymyalgia rheumatica) ([place]) 09/09/2013  . Polymyalgia rheumatica (HCC)    maintained on Prednisone, Plaquenil. Followed by rhuematology every 4 months/[name].  . Renal cell carcinoma ([place]) 07/23/2015  . Renal mass 07/12/2015  . Rheumatoid arthritis ([place]) 04/13/2012  . Shortness of breath dyspnea    with exertion  . Sleep apnea    CPAP   Trying to use  . Spinal stenosis of lumbar region with radiculopathy 08/07/2015  . Type 2 diabetes mellitus with other specified complication ([place]) 90/04/4095    Past Surgical History:  Procedure Laterality Date  . APPENDECTOMY    . BIOPSY  01/23/2019   Procedure: BIOPSY;  Surgeon: Otis Brace, MD;  Location: [facility];  Service: Gastroenterology;;  . [place] 2000   left  . CATARACT  EXTRACTION W/PHACO Right 11/28/2014   Procedure: CATARACT EXTRACTION PHACO AND INTRAOCULAR LENS PLACEMENT (IOC) RIGHT ;  Surgeon: Marylynn Pearson, MD;  Location: [facility];  Service: Ophthalmology;  Laterality: Right;  . CORONARY ARTERY BYPASS GRAFT  03/17/1995   Wynonia Lawman; followed every six months.  . ESOPHAGOGASTRODUODENOSCOPY (EGD) WITH PROPOFOL Left 01/23/2019   Procedure: ESOPHAGOGASTRODUODENOSCOPY (EGD) WITH PROPOFOL;  Surgeon: Otis Brace, MD;  [facility];  Service: Gastroenterology;  Laterality: Left;  . HERNIA REPAIR    . LUMBAR LAMINECTOMY/DECOMPRESSION MICRODISCECTOMY N/A 07/30/2015   Procedure: LUMBAR  LAMINECTOMY DISCECTOMY ;  Surgeon: Ashok Pall, MD;  Location: [place];  Service: Neurosurgery;  Laterality: N/A;  LUMBAR LAMINECTOMY DISCECTOMY   . LUMBAR LAMINECTOMY/DECOMPRESSION MICRODISCECTOMY N/A 09/06/2015   Procedure: Redo L3/4 Disectomy;  Surgeon: Ashok Pall, MD;  Location: [facility];  Service: Neurosurgery;  Laterality: N/A;  . PROSTATE SURGERY     TURP at [place].  Marland Kitchen TRANSURETHRAL RESECTION OF PROSTATE      There were no vitals filed for this visit.   Subjective Assessment - 01/17/20 1147    Subjective Pt reports falling Tuesday morning while he was brushing his teeth. He reports "my legs just gave out on me." Pt reports he is "sore all over from fall."    Pertinent History had HHPT 2 months ago after hospitalized 5 days related to kidney    Currently in Pain? Yes   Sore "all over rom fall."   Pain Score 6     Pain Location Hip    Pain Orientation Right    Pain Descriptors / Indicators Throbbing;Shooting;Sharp    Aggravating Factors  Standing/walking    Pain Relieving Factors Nothing but sitting    Multiple Pain Sites No                             [facility] Adult PT Treatment/Exercise - 01/17/20 0001      Knee/Hip Exercises: Aerobic   Nustep L1 x  8 min with MHP to lumbar      Knee/Hip Exercises: Seated   Long Arc Quad Strengthening;Both;1 set;10 reps;Weights    Long Arc Quad Weight 3 lbs.    Ball Squeeze 10x   hip pain   Knee/Hip Flexion 0# 20x     Other Seated Knee/Hip Exercises heel & toe raise with 5# resting on knee 10x right/left     Abd/Adduction Limitations Ankle rocker board 2 min DF/PF    Sit to Sand 5 reps;with UE support   PTA had to hold walker down.      Shoulder Exercises: Seated   Row Strengthening;Both;20 reps;Theraband    Theraband Level (Shoulder Row) Level 4 (Blue)    Flexion AAROM;Both;10 reps    Flexion Limitations used cane                    PT Short Term Goals - 12/12/19 2140      PT SHORT TERM GOAL #1    Title Pt will be independent with his initial HEP to improve flexibility, strength and balance.     Status Partially Met      PT SHORT TERM GOAL #2   Title perform 5x sit to stand in < or = to 25 seconds with UE support to improve balance    Status Achieved      PT SHORT TERM GOAL #3   Title Timed up and Go time improved to 29 sec or less  Status Achieved      PT SHORT TERM GOAL #4   Title The patient will be able to walk 200 feet with RW needed for improved short distance community mobility    Status Deferred             PT Long Term Goals - 12/12/19 2141      PT LONG TERM GOAL #1   Title Pt will demo independence with his advanced HEP to futher promote strength gains after d/c from PT.    Time 8    Period Weeks    Status On-going    Target Date 02/06/20      PT LONG TERM GOAL #2   Title Pt will demo improved functional strength and power of the LEs evident by his ability to complete 5x/sit to stand in less than 22 sec with moderate UE use.    Time 8    Status Achieved      PT LONG TERM GOAL #3   Title The patient will be able to stand for 30 sec with minimal UE support without loss of balance needed for personal care/grooming    Time 8    Period Weeks    Status Revised      PT LONG TERM GOAL #4   Title BERG balance score improved to 17/56    Time 8    Period Weeks    Status Revised      PT LONG TERM GOAL #5   Title Timed up and go improved to 22 sec indicating improved gait speed and safety    Time 8    Period Weeks                 Plan - 01/17/20 1206    Clinical Impression Statement Pt reports having a fall Tuesday morning when he was standing at the sink brushing his teeth. He reports his legs "went out from under him."    Personal Factors and Comorbidities Age;Fitness;Comorbidity 1;Comorbidity 2;Comorbidity 3+;Time since onset of injury/illness/exacerbation    Comorbidities COPD; peripheral neuropathy; renal failure; diabetes; heart disease;   chronic LBP    Examination-Activity Limitations Bathing;Locomotion Level;Transfers;Bend;Carry;Dressing;Lift;Stand;Hygiene/Grooming    Examination-Participation Restrictions Community Activity;Interpersonal Relationship;Other;Meal Prep    Stability/Clinical Decision Making Evolving/Moderate complexity    Rehab Potential Good    PT Frequency 2x / week    PT Duration 12 weeks    PT Treatment/Interventions ADLs/Self Care Home Management;Moist Heat;Neuromuscular re-education;Balance training;Therapeutic exercise;Therapeutic activities;Functional mobility training;Gait training;Patient/family education;Manual techniques;Dry needling    PT Next Visit Plan KX now;  Nu-Step; seated and standing general strengthening low level;    Consulted and Agree with Plan of Care Patient           Patient will benefit from skilled therapeutic intervention in order to improve the following deficits and impairments:  Difficulty walking, Decreased endurance, Decreased activity tolerance, Impaired perceived functional ability, Pain, Decreased balance, Decreased mobility, Decreased strength, Postural dysfunction  Visit Diagnosis: Muscle weakness (generalized)  Unsteadiness on feet  Chronic low back pain, unspecified back pain laterality, unspecified whether sciatica present  Other abnormalities of gait and mobility     Problem List Patient Active Problem List   Diagnosis Date Noted  . Sleep apnea   . Polymyalgia rheumatica ([place])   . Peripheral neuropathy   . Obesity   . Lumbar disc disease   . Hypertension   . Hyperlipidemia   . Hepatitis   . Gout   . Diabetes mellitus without complication ([place])   .  Coronary artery disease   . Chronic kidney disease   . CHF (congestive heart failure) ([place])   . Cataract   . Cancer of kidney ([place])   . BPH (benign prostatic hyperplasia)   . Asthma   . Arthritis   . Anxiety   . Anemia   . Allergy   . Other chronic pain   . Intractable nausea and vomiting  01/19/2019  . Intractable vomiting   . Hypokalemia   . GERD (gastroesophageal reflux disease) 11/07/2018  . History of CHF (congestive heart failure)   . Facial cellulitis   . Acute on chronic congestive heart failure ([place])   . Gait abnormality 11/02/2018  . Cellulitis 11/01/2018  . Paroxysmal atrial fibrillation ([place]) 08/22/2018  . High risk medication use 08/22/2018  . Chronic anticoagulation 08/22/2018  . On amiodarone therapy 08/22/2018  . Lumbar radiculopathy 05/05/2018  . Hypertensive heart disease 04/24/2018  . CKD (chronic kidney disease) 11/27/2015  . Chronic diastolic heart failure ([place]) 11/27/2015  . Normocytic anemia 11/27/2015  . AKI (acute kidney injury) ([place])   . Acute renal failure superimposed on stage 4 chronic kidney disease ([place]) 10/02/2015  . Hypotension 10/02/2015  . Anorexia   . Adjustment disorder with mixed emotional features 09/01/2015  . Acute encephalopathy 08/29/2015  . Elevated troponin 08/29/2015  . Renal cancer ([place]) 08/28/2015  . Spinal stenosis of lumbar region with radiculopathy 08/07/2015  . COPD (chronic obstructive pulmonary disease) ([place])   . OSA (obstructive sleep apnea) 07/27/2015  . HNP (herniated nucleus pulposus), lumbar 07/24/2015  . Renal cell carcinoma ([place]) 07/23/2015  . Essential hypertension 07/12/2015  . Renal mass 07/12/2015  . PMR (polymyalgia rheumatica) ([place]) 09/09/2013  . Rheumatoid arthritis ([place]) 04/13/2012  . CAD (coronary artery disease) 12/19/2011  . Type 2 diabetes mellitus with other specified complication ([place]) 83/46/2194  . Neuropathy ([place]) 12/19/2011    Cosima Prentiss, PTA 01/17/2020, 12:14 PM  [facility] [address], [place], [place], [address] Phone: [phone]   Fax:  [phone]  Name: WILBUR OAKLAND MRN: [id number] Date of Birth: [birth year]

## 2020-01-22 ENCOUNTER — Ambulatory Visit: Payer: No Typology Code available for payment source | Admitting: Physical Therapy

## 2020-01-22 ENCOUNTER — Other Ambulatory Visit: Payer: Self-pay

## 2020-01-22 ENCOUNTER — Encounter: Payer: Self-pay | Admitting: Physical Therapy

## 2020-01-22 DIAGNOSIS — M6281 Muscle weakness (generalized): Secondary | ICD-10-CM

## 2020-01-22 DIAGNOSIS — R2681 Unsteadiness on feet: Secondary | ICD-10-CM

## 2020-01-22 DIAGNOSIS — G8929 Other chronic pain: Secondary | ICD-10-CM

## 2020-01-22 DIAGNOSIS — R2689 Other abnormalities of gait and mobility: Secondary | ICD-10-CM

## 2020-01-22 DIAGNOSIS — M545 Low back pain, unspecified: Secondary | ICD-10-CM

## 2020-01-22 NOTE — Therapy (Signed)
[facility] [address] [place], [place], [address] Phone: [phone]   Fax:  [phone]  Physical Therapy Treatment  Patient Details  Name: Jacob [name] MRN: [id number] Date of Birth: 04/04/1940 Referring Provider (PT): Dr. Elberta Spaniel    Encounter Date: 01/22/2020   PT End of Session - 01/22/20 1102    Visit Number 19    Number of Visits 30    Date for PT Re-Evaluation 02/06/20    Authorization Type VA 15 additional visits 12/27/19-04/25/2020;  will submit for additional visits    PT Start Time 1056   pt low level exerciser   PT Stop Time 1127    PT Time Calculation (min) 31 min    Activity Tolerance Patient tolerated treatment well;Patient limited by pain;Patient limited by fatigue    Behavior During Therapy [facility] for tasks assessed/performed           Past Medical History:  Diagnosis Date  . Acute encephalopathy 08/29/2015  . Acute on chronic congestive heart failure ([place])   . Acute renal failure superimposed on stage 4 chronic kidney disease ([place]) 10/02/2015  . Adjustment disorder with mixed emotional features 09/01/2015  . AKI (acute kidney injury) ([place])   . Allergy   . Anemia   . Anxiety   . Arthritis   . Asthma   . BPH (benign prostatic hyperplasia)   . CAD (coronary artery disease) 12/19/2011  . Cancer of kidney ([place])   . Cataract   . CHF (congestive heart failure) ([place])   . Chronic anticoagulation 08/22/2018  . Chronic diastolic heart failure ([place]) 11/27/2015  . Chronic kidney disease    chronic  kidney failure  kidney function at 42%  . CKD (chronic kidney disease) 11/27/2015  . COPD (chronic obstructive pulmonary disease) ([place])   . Coronary artery disease    CABG  7 bypasses  . Dehydration   . Diabetes mellitus without complication ([place])   . Elevated troponin 08/29/2015  . Essential hypertension 07/12/2015  . Facial cellulitis   . GERD (gastroesophageal reflux disease)   . Gout   . Hepatitis     many years ago  . High risk medication use 08/22/2018  . History of CHF (congestive heart failure)   . HNP (herniated nucleus pulposus), lumbar 07/24/2015  . Hyperlipidemia   . Hypertension   . Hypertensive heart disease 04/24/2018  . Hypokalemia   . Hypotension 10/02/2015  . Lumbar disc disease   . Lumbar radiculopathy 05/05/2018  . Neuropathy ([place]) 12/19/2011  . Normocytic anemia 11/27/2015  . Obesity   . On amiodarone therapy 08/22/2018  . OSA (obstructive sleep apnea) 07/27/2015  . Paroxysmal atrial fibrillation (HCC)   . Peripheral neuropathy   . PMR (polymyalgia rheumatica) ([place]) 09/09/2013  . Polymyalgia rheumatica (HCC)    maintained on Prednisone, Plaquenil. Followed by rhuematology every 4 months/[name].  . Renal cell carcinoma ([place]) 07/23/2015  . Renal mass 07/12/2015  . Rheumatoid arthritis ([place]) 04/13/2012  . Shortness of breath dyspnea    with exertion  . Sleep apnea    CPAP   Trying to use  . Spinal stenosis of lumbar region with radiculopathy 08/07/2015  . Type 2 diabetes mellitus with other specified complication ([place]) 73/04/2023    Past Surgical History:  Procedure Laterality Date  . APPENDECTOMY    . BIOPSY  01/23/2019   Procedure: BIOPSY;  Surgeon: Otis Brace, MD;  [facility];  Service: Gastroenterology;;  . [place], 2000  left  . CATARACT EXTRACTION W/PHACO Right 11/28/2014   Procedure: CATARACT EXTRACTION PHACO AND INTRAOCULAR LENS PLACEMENT (IOC) RIGHT ;  Surgeon: Marylynn Pearson, MD;  Location: [facility];  Service: Ophthalmology;  Laterality: Right;  . CORONARY ARTERY BYPASS GRAFT  03/17/1995   Wynonia Lawman; followed every six months.  . ESOPHAGOGASTRODUODENOSCOPY (EGD) WITH PROPOFOL Left 01/23/2019   Procedure: ESOPHAGOGASTRODUODENOSCOPY (EGD) WITH PROPOFOL;  Surgeon: Otis Brace, MD;  [facility];  Service: Gastroenterology;  Laterality: Left;  . HERNIA REPAIR    . LUMBAR LAMINECTOMY/DECOMPRESSION MICRODISCECTOMY N/A  07/30/2015   Procedure: LUMBAR LAMINECTOMY DISCECTOMY ;  Surgeon: Ashok Pall, MD;  Location: [facility];  Service: Neurosurgery;  Laterality: N/A;  LUMBAR LAMINECTOMY DISCECTOMY   . LUMBAR LAMINECTOMY/DECOMPRESSION MICRODISCECTOMY N/A 09/06/2015   Procedure: Redo L3/4 Disectomy;  Surgeon: Ashok Pall, MD;  Location: [facility];  Service: Neurosurgery;  Laterality: N/A;  . PROSTATE SURGERY     TURP at [place].  Marland Kitchen TRANSURETHRAL RESECTION OF PROSTATE      There were no vitals filed for this visit.   Subjective Assessment - 01/22/20 1104    Subjective I fell 2x over the weekend: my legs keep going out from under me.    Pertinent History had HHPT 2 months ago after hospitalized 5 days related to kidney    Currently in Pain? Yes    Pain Score 5     Pain Location Back    Pain Orientation Right    Pain Descriptors / Indicators Sore    Aggravating Factors  Standing    Pain Relieving Factors Not much    Multiple Pain Sites No                             [facility] Adult PT Treatment/Exercise - 01/22/20 0001      Knee/Hip Exercises: Aerobic   Nustep L2 x 5 min PTA present to monitor   Stopped d/t Rt hip/leg pain increasing     Knee/Hip Exercises: Seated   Long Arc Quad Strengthening;Both;1 set;10 reps;Weights    Long Arc Quad Weight 3 lbs.    Ball Squeeze 15x    Clamshell with TheraBand --   black loop 3x10   Knee/Hip Flexion 0# 20x     Other Seated Knee/Hip Exercises heel & toe raise with 5# resting on knee 30x right/left     Abd/Adduction Limitations Ankle rocker board 3 min DF/PF    Sit to Sand 5 reps;with UE support                    PT Short Term Goals - 12/12/19 2140      PT SHORT TERM GOAL #1   Title Pt will be independent with his initial HEP to improve flexibility, strength and balance.     Status Partially Met      PT SHORT TERM GOAL #2   Title perform 5x sit to stand in < or = to 25 seconds with UE support to improve balance    Status Achieved       PT SHORT TERM GOAL #3   Title Timed up and Go time improved to 29 sec or less    Status Achieved      PT SHORT TERM GOAL #4   Title The patient will be able to walk 200 feet with RW needed for improved short distance community mobility    Status Deferred  PT Long Term Goals - 12/12/19 2141      PT LONG TERM GOAL #1   Title Pt will demo independence with his advanced HEP to futher promote strength gains after d/c from PT.    Time 8    Period Weeks    Status On-going    Target Date 02/06/20      PT LONG TERM GOAL #2   Title Pt will demo improved functional strength and power of the LEs evident by his ability to complete 5x/sit to stand in less than 22 sec with moderate UE use.    Time 8    Status Achieved      PT LONG TERM GOAL #3   Title The patient will be able to stand for 30 sec with minimal UE support without loss of balance needed for personal care/grooming    Time 8    Period Weeks    Status Revised      PT LONG TERM GOAL #4   Title BERG balance score improved to 17/56    Time 8    Period Weeks    Status Revised      PT LONG TERM GOAL #5   Title Timed up and go improved to 22 sec indicating improved gait speed and safety    Time 8    Period Weeks                 Plan - 01/22/20 1119    Clinical Impression Statement Pt reports 2 more falls this weekend where his "legs go out from under him." He reports his daughter has been in contact with the MD but does not see them until December. back pain was more managable today, pt could perform a few more reps than he had previiously. PTA unsure of pt's safety when standing and will speak with PT about this. Pt did mention he and daughter have been discussing water therapy and might like to pursue this avenue.    Personal Factors and Comorbidities Age;Fitness;Comorbidity 1;Comorbidity 2;Comorbidity 3+;Time since onset of injury/illness/exacerbation    Comorbidities COPD; peripheral neuropathy;  renal failure; diabetes; heart disease;  chronic LBP    Examination-Activity Limitations Bathing;Locomotion Level;Transfers;Bend;Carry;Dressing;Lift;Stand;Hygiene/Grooming    Examination-Participation Restrictions Community Activity;Interpersonal Relationship;Other;Meal Prep    Stability/Clinical Decision Making Evolving/Moderate complexity    Rehab Potential Good    PT Frequency 2x / week    PT Duration 12 weeks    PT Treatment/Interventions ADLs/Self Care Home Management;Moist Heat;Neuromuscular re-education;Balance training;Therapeutic exercise;Therapeutic activities;Functional mobility training;Gait training;Patient/family education;Manual techniques;Dry needling    PT Next Visit Plan KX now;  Stacy: I kept the level still low even though overall his pain was limiting, I did not want to increase any pain when he was having a "good day."  I did not do any standing based on his reports of his legs just going out....his MD appt to follow up on his LE is not until December.    Consulted and Agree with Plan of Care Patient           Patient will benefit from skilled therapeutic intervention in order to improve the following deficits and impairments:  Difficulty walking, Decreased endurance, Decreased activity tolerance, Impaired perceived functional ability, Pain, Decreased balance, Decreased mobility, Decreased strength, Postural dysfunction  Visit Diagnosis: Muscle weakness (generalized)  Unsteadiness on feet  Chronic low back pain, unspecified back pain laterality, unspecified whether sciatica present  Other abnormalities of gait and mobility     Problem List Patient Active Problem  List   Diagnosis Date Noted  . Sleep apnea   . Polymyalgia rheumatica ([place])   . Peripheral neuropathy   . Obesity   . Lumbar disc disease   . Hypertension   . Hyperlipidemia   . Hepatitis   . Gout   . Diabetes mellitus without complication ([place])   . Coronary artery disease   . Chronic kidney  disease   . CHF (congestive heart failure) ([place])   . Cataract   . Cancer of kidney ([place])   . BPH (benign prostatic hyperplasia)   . Asthma   . Arthritis   . Anxiety   . Anemia   . Allergy   . Other chronic pain   . Intractable nausea and vomiting 01/19/2019  . Intractable vomiting   . Hypokalemia   . GERD (gastroesophageal reflux disease) 11/07/2018  . History of CHF (congestive heart failure)   . Facial cellulitis   . Acute on chronic congestive heart failure ([place])   . Gait abnormality 11/02/2018  . Cellulitis 11/01/2018  . Paroxysmal atrial fibrillation ([place]) 08/22/2018  . High risk medication use 08/22/2018  . Chronic anticoagulation 08/22/2018  . On amiodarone therapy 08/22/2018  . Lumbar radiculopathy 05/05/2018  . Hypertensive heart disease 04/24/2018  . CKD (chronic kidney disease) 11/27/2015  . Chronic diastolic heart failure ([place]) 11/27/2015  . Normocytic anemia 11/27/2015  . AKI (acute kidney injury) ([place])   . Acute renal failure superimposed on stage 4 chronic kidney disease ([place]) 10/02/2015  . Hypotension 10/02/2015  . Anorexia   . Adjustment disorder with mixed emotional features 09/01/2015  . Acute encephalopathy 08/29/2015  . Elevated troponin 08/29/2015  . Renal cancer ([place]) 08/28/2015  . Spinal stenosis of lumbar region with radiculopathy 08/07/2015  . COPD (chronic obstructive pulmonary disease) ([place])   . OSA (obstructive sleep apnea) 07/27/2015  . HNP (herniated nucleus pulposus), lumbar 07/24/2015  . Renal cell carcinoma ([place]) 07/23/2015  . Essential hypertension 07/12/2015  . Renal mass 07/12/2015  . PMR (polymyalgia rheumatica) ([place]) 09/09/2013  . Rheumatoid arthritis ([place]) 04/13/2012  . CAD (coronary artery disease) 12/19/2011  . Type 2 diabetes mellitus with other specified complication ([place]) 72/25/7505  . Neuropathy ([place]) 12/19/2011    Anasha Perfecto, PTA 01/22/2020, 11:32 AM  [facility] [address], [place], [place], [address] Phone: [phone]   Fax:  [phone]  Name: DIQUAN KASSIS MRN: [id number] Date of Birth: December 07, 1940

## 2020-01-23 ENCOUNTER — Other Ambulatory Visit: Payer: Self-pay

## 2020-01-23 DIAGNOSIS — E785 Hyperlipidemia, unspecified: Secondary | ICD-10-CM | POA: Diagnosis not present

## 2020-01-23 DIAGNOSIS — I480 Paroxysmal atrial fibrillation: Secondary | ICD-10-CM | POA: Diagnosis not present

## 2020-01-23 DIAGNOSIS — I5032 Chronic diastolic (congestive) heart failure: Secondary | ICD-10-CM | POA: Diagnosis not present

## 2020-01-23 DIAGNOSIS — I110 Hypertensive heart disease with heart failure: Secondary | ICD-10-CM | POA: Diagnosis not present

## 2020-01-23 DIAGNOSIS — Z7901 Long term (current) use of anticoagulants: Secondary | ICD-10-CM

## 2020-01-23 DIAGNOSIS — I25118 Atherosclerotic heart disease of native coronary artery with other forms of angina pectoris: Secondary | ICD-10-CM | POA: Diagnosis not present

## 2020-01-23 DIAGNOSIS — N184 Chronic kidney disease, stage 4 (severe): Secondary | ICD-10-CM | POA: Diagnosis not present

## 2020-01-24 LAB — LIPID PANEL
Chol/HDL Ratio: 9 ratio — ABNORMAL HIGH (ref 0.0–5.0)
Cholesterol, Total: 260 mg/dL — ABNORMAL HIGH (ref 100–199)
HDL: 29 mg/dL — ABNORMAL LOW (ref >39)
LDL Chol Calc (NIH): 181 mg/dL — ABNORMAL HIGH (ref 0–99)
Triglycerides: 260 mg/dL — ABNORMAL HIGH (ref 0–149)
VLDL Cholesterol Cal: 50 mg/dL — ABNORMAL HIGH (ref 5–40)

## 2020-01-24 NOTE — Progress Notes (Signed)
I was able to speak with Mr. Beckner wife. He has been off of the statin medication. I advised her that we would like him to be see neurology. Her daughter is going to try to get an appt at [facility] (wife is already established there). I offered an ambulatory referral - she will call back if she needs this. All questions answered.

## 2020-01-25 ENCOUNTER — Other Ambulatory Visit: Payer: Self-pay

## 2020-01-25 ENCOUNTER — Ambulatory Visit: Payer: No Typology Code available for payment source | Admitting: Physical Therapy

## 2020-01-25 DIAGNOSIS — R2689 Other abnormalities of gait and mobility: Secondary | ICD-10-CM

## 2020-01-25 DIAGNOSIS — M6281 Muscle weakness (generalized): Secondary | ICD-10-CM | POA: Diagnosis not present

## 2020-01-25 DIAGNOSIS — M545 Low back pain, unspecified: Secondary | ICD-10-CM

## 2020-01-25 DIAGNOSIS — G8929 Other chronic pain: Secondary | ICD-10-CM

## 2020-01-25 DIAGNOSIS — R2681 Unsteadiness on feet: Secondary | ICD-10-CM

## 2020-01-25 NOTE — Therapy (Signed)
[facility] [address] [place], [place], [address] Phone: [phone]   Fax:  [phone]  Physical Therapy Treatment  Patient Details  Name: Jacob [name] MRN: [id number] Date of Birth: [birth year] Referring Provider (PT): Dr. Elberta Spaniel   Progress Note Reporting Period 11/21/19 to 01/25/20  See note below for Objective Data and Assessment of Progress/Goals.      Encounter Date: 01/25/2020   PT End of Session - 01/25/20 1236    Visit Number 20    Number of Visits 30    Date for PT Re-Evaluation 02/06/20    Authorization Type VA 15 additional visits 12/27/19-04/25/2020;    PT Start Time 9357    PT Stop Time 1228    PT Time Calculation (min) 43 min    Activity Tolerance Patient tolerated treatment well;Patient limited by pain;Patient limited by fatigue           Past Medical History:  Diagnosis Date  . Acute encephalopathy 08/29/2015  . Acute on chronic congestive heart failure ([place])   . Acute renal failure superimposed on stage 4 chronic kidney disease ([place]) 10/02/2015  . Adjustment disorder with mixed emotional features 09/01/2015  . AKI (acute kidney injury) ([place])   . Allergy   . Anemia   . Anxiety   . Arthritis   . Asthma   . BPH (benign prostatic hyperplasia)   . CAD (coronary artery disease) 12/19/2011  . Cancer of kidney ([place])   . Cataract   . CHF (congestive heart failure) ([place])   . Chronic anticoagulation 08/22/2018  . Chronic diastolic heart failure ([place]) 11/27/2015  . Chronic kidney disease    chronic  kidney failure  kidney function at 42%  . CKD (chronic kidney disease) 11/27/2015  . COPD (chronic obstructive pulmonary disease) ([place])   . Coronary artery disease    CABG  7 bypasses  . Dehydration   . Diabetes mellitus without complication ([place])   . Elevated troponin 08/29/2015  . Essential hypertension 07/12/2015  . Facial cellulitis   . GERD (gastroesophageal reflux disease)   . Gout   .  Hepatitis    many years ago  . High risk medication use 08/22/2018  . History of CHF (congestive heart failure)   . HNP (herniated nucleus pulposus), lumbar 07/24/2015  . Hyperlipidemia   . Hypertension   . Hypertensive heart disease 04/24/2018  . Hypokalemia   . Hypotension 10/02/2015  . Lumbar disc disease   . Lumbar radiculopathy 05/05/2018  . Neuropathy ([place]) 12/19/2011  . Normocytic anemia 11/27/2015  . Obesity   . On amiodarone therapy 08/22/2018  . OSA (obstructive sleep apnea) 07/27/2015  . Paroxysmal atrial fibrillation (HCC)   . Peripheral neuropathy   . PMR (polymyalgia rheumatica) ([place]) 09/09/2013  . Polymyalgia rheumatica (HCC)    maintained on Prednisone, Plaquenil. Followed by rhuematology every 4 months/[name].  . Renal cell carcinoma ([place]) 07/23/2015  . Renal mass 07/12/2015  . Rheumatoid arthritis ([place]) 04/13/2012  . Shortness of breath dyspnea    with exertion  . Sleep apnea    CPAP   Trying to use  . Spinal stenosis of lumbar region with radiculopathy 08/07/2015  . Type 2 diabetes mellitus with other specified complication ([place]) 03/22/7937    Past Surgical History:  Procedure Laterality Date  . APPENDECTOMY    . BIOPSY  01/23/2019   Procedure: BIOPSY;  Surgeon: Otis Brace, MD;  Location: [facility];  Service: Gastroenterology;;  . [place], 2000  left  . CATARACT EXTRACTION W/PHACO Right 11/28/2014   Procedure: CATARACT EXTRACTION PHACO AND INTRAOCULAR LENS PLACEMENT (IOC) RIGHT ;  Surgeon: Marylynn Pearson, MD;  Location: [place] [facility];  Service: Ophthalmology;  Laterality: Right;  . CORONARY ARTERY BYPASS GRAFT  03/17/1995   [place] Lawman; followed every six months.  . ESOPHAGOGASTRODUODENOSCOPY (EGD) WITH PROPOFOL Left 01/23/2019   Procedure: ESOPHAGOGASTRODUODENOSCOPY (EGD) WITH PROPOFOL;  Surgeon: Otis Brace, MD;  [facility];  Service: Gastroenterology;  Laterality: Left;  . HERNIA REPAIR    . LUMBAR LAMINECTOMY/DECOMPRESSION  MICRODISCECTOMY N/A 07/30/2015   Procedure: LUMBAR LAMINECTOMY DISCECTOMY ;  Surgeon: Ashok Pall, MD;  Location: [place] [facility];  Service: Neurosurgery;  Laterality: N/A;  LUMBAR LAMINECTOMY DISCECTOMY   . LUMBAR LAMINECTOMY/DECOMPRESSION MICRODISCECTOMY N/A 09/06/2015   Procedure: Redo L3/4 Disectomy;  Surgeon: Ashok Pall, MD;  Location: [place] [facility];  Service: Neurosurgery;  Laterality: N/A;  . PROSTATE SURGERY     TURP at [place].  Marland Kitchen TRANSURETHRAL RESECTION OF PROSTATE      There were no vitals filed for this visit.   Subjective Assessment - 01/25/20 1148    Subjective I'm not so good.  My back, shoulders and hands hurt.  I've had a number of falls and several times when I caught myself with the walker.   I'm going for more testing in [place].  My daughter wants me to go to the neurologist.  PT doesn't make my pain worse.  It just stays about a 5 or 6/10.    Pertinent History had HHPT 2 months ago after hospitalized 5 days related to kidney    Limitations Walking;Standing;House hold activities    How long can you stand comfortably? sits in chair in shower with wife's assist    How long can you walk comfortably? no where comfortably, need RW to walk apartment to car 100 feet    Patient Stated Goals walk better, get around better    Currently in Pain? Yes    Pain Score 6     Pain Location Back    Pain Orientation Right    Pain Type Chronic pain    Pain Radiating Towards right lower leg    Pain Relieving Factors heat, rubbing my leg              [facility] PT Assessment - 01/25/20 0001      Standardized Balance Assessment   Five times sit to stand comments  19.44 with UE assist       Timed Up and Go Test   Normal TUG (seconds) 23.16                         [facility] Adult PT Treatment/Exercise - 01/25/20 0001      Neuro Re-ed    Neuro Re-ed Details  head turns, trunk rotation, UE reaches single arm only;  weight shifting 45 sec side to side       Knee/Hip  Exercises: Aerobic   Nustep L2 8 min while discussing progress      Knee/Hip Exercises: Standing   Hip Abduction AROM;Right;Left;5 reps    Other Standing Knee Exercises step taps 5x       Knee/Hip Exercises: Seated   Long Arc Quad Strengthening;Right;Left;15 reps;Weights    Long Arc Quad Weight 3 lbs.    Clamshell with TheraBand --   black loop 3x10   Other Seated Knee/Hip Exercises heel & toe raise with 5# resting on knee 30x right/left  Seated blue band rows 20x           PT Short Term Goals - 12/12/19 2140      PT SHORT TERM GOAL #1   Title Pt will be independent with his initial HEP to improve flexibility, strength and balance.     Status Partially Met      PT SHORT TERM GOAL #2   Title perform 5x sit to stand in < or = to 25 seconds with UE support to improve balance    Status Achieved      PT SHORT TERM GOAL #3   Title Timed up and Go time improved to 29 sec or less    Status Achieved      PT SHORT TERM GOAL #4   Title The patient will be able to walk 200 feet with RW needed for improved short distance community mobility    Status Deferred             PT Long Term Goals - 12/12/19 2141      PT LONG TERM GOAL #1   Title Pt will demo independence with his advanced HEP to futher promote strength gains after d/c from PT.    Time 8    Period Weeks    Status On-going    Target Date 02/06/20      PT LONG TERM GOAL #2   Title Pt will demo improved functional strength and power of the LEs evident by his ability to complete 5x/sit to stand in less than 22 sec with moderate UE use.    Time 8    Status Achieved      PT LONG TERM GOAL #3   Title The patient will be able to stand for 30 sec with minimal UE support without loss of balance needed for personal care/grooming    Time 8    Period Weeks    Status Revised      PT LONG TERM GOAL #4   Title BERG balance score improved to 17/56    Time 8    Period Weeks    Status Revised      PT LONG  TERM GOAL #5   Title Timed up and go improved to 22 sec indicating improved gait speed and safety    Time 8    Period Weeks                 Plan - 01/25/20 1237    Clinical Impression Statement The patinet has had several recent falls at home secondary to knee buckling/give-way.  He is scheduled to see his doctor and a neurologist regarding this increase in occurrence.  He states it typically happens after he's been sitting for a while and/or with just waking up and not following exercise/PT.  He has been fairly limited by back pain at times but today he reports his pain is improved with ex.  His 5x sit to stand time is significantly improved from previous trials and Timed up and Go is improved from last attempt as well.  Anticipate a slower progression secondary to numerous co-morbities.  Without PT he would most certainly have a decline in function.    Personal Factors and Comorbidities Age;Fitness;Comorbidity 1;Comorbidity 2;Comorbidity 3+;Time since onset of injury/illness/exacerbation    Comorbidities COPD; peripheral neuropathy; renal failure; diabetes; heart disease;  chronic LBP    Examination-Activity Limitations Bathing;Locomotion Level;Transfers;Bend;Carry;Dressing;Lift;Stand;Hygiene/Grooming    Rehab Potential Good    PT Frequency 2x / week    PT  Duration 12 weeks    PT Treatment/Interventions ADLs/Self Care Home Management;Moist Heat;Neuromuscular re-education;Balance training;Therapeutic exercise;Therapeutic activities;Functional mobility training;Gait training;Patient/family education;Manual techniques;Dry needling    PT Next Visit Plan KX now;  Nu-Step;  seated and standing low level trunk and LE strengthening           Patient will benefit from skilled therapeutic intervention in order to improve the following deficits and impairments:  Difficulty walking, Decreased endurance, Decreased activity tolerance, Impaired perceived functional ability, Pain, Decreased balance,  Decreased mobility, Decreased strength, Postural dysfunction  Visit Diagnosis: Muscle weakness (generalized)  Unsteadiness on feet  Chronic low back pain, unspecified back pain laterality, unspecified whether sciatica present  Other abnormalities of gait and mobility     Problem List Patient Active Problem List   Diagnosis Date Noted  . Sleep apnea   . Polymyalgia rheumatica ([place])   . Peripheral neuropathy   . Obesity   . Lumbar disc disease   . Hypertension   . Hyperlipidemia   . Hepatitis   . Gout   . Diabetes mellitus without complication ([place])   . Coronary artery disease   . Chronic kidney disease   . CHF (congestive heart failure) ([place])   . Cataract   . Cancer of kidney ([place])   . BPH (benign prostatic hyperplasia)   . Asthma   . Arthritis   . Anxiety   . Anemia   . Allergy   . Other chronic pain   . Intractable nausea and vomiting 01/19/2019  . Intractable vomiting   . Hypokalemia   . GERD (gastroesophageal reflux disease) 11/07/2018  . History of CHF (congestive heart failure)   . Facial cellulitis   . Acute on chronic congestive heart failure ([place])   . Gait abnormality 11/02/2018  . Cellulitis 11/01/2018  . Paroxysmal atrial fibrillation ([place]) 08/22/2018  . High risk medication use 08/22/2018  . Chronic anticoagulation 08/22/2018  . On amiodarone therapy 08/22/2018  . Lumbar radiculopathy 05/05/2018  . Hypertensive heart disease 04/24/2018  . CKD (chronic kidney disease) 11/27/2015  . Chronic diastolic heart failure ([place]) 11/27/2015  . Normocytic anemia 11/27/2015  . AKI (acute kidney injury) ([place])   . Acute renal failure superimposed on stage 4 chronic kidney disease ([place]) 10/02/2015  . Hypotension 10/02/2015  . Anorexia   . Adjustment disorder with mixed emotional features 09/01/2015  . Acute encephalopathy 08/29/2015  . Elevated troponin 08/29/2015  . Renal cancer ([place]) 08/28/2015  . Spinal stenosis of lumbar region with radiculopathy  08/07/2015  . COPD (chronic obstructive pulmonary disease) ([place])   . OSA (obstructive sleep apnea) 07/27/2015  . HNP (herniated nucleus pulposus), lumbar 07/24/2015  . Renal cell carcinoma ([place]) 07/23/2015  . Essential hypertension 07/12/2015  . Renal mass 07/12/2015  . PMR (polymyalgia rheumatica) ([place]) 09/09/2013  . Rheumatoid arthritis ([place]) 04/13/2012  . CAD (coronary artery disease) 12/19/2011  . Type 2 diabetes mellitus with other specified complication ([place]) 03/70/4888  . Neuropathy ([place]) 12/19/2011   Ruben Im, PT 01/25/20 7:26 PM Phone: [phone] Fax: [phone] Alvera Singh 01/25/2020, 7:24 PM  [facility] [address] [place], [place], [address] Phone: [phone]   Fax:  [phone]  Name: KIELAN DREISBACH MRN: [id number] Date of Birth: 01-23-1941

## 2020-01-30 ENCOUNTER — Ambulatory Visit: Payer: No Typology Code available for payment source | Admitting: Physical Therapy

## 2020-01-30 ENCOUNTER — Other Ambulatory Visit: Payer: Self-pay

## 2020-01-30 DIAGNOSIS — M6281 Muscle weakness (generalized): Secondary | ICD-10-CM

## 2020-01-30 DIAGNOSIS — R2689 Other abnormalities of gait and mobility: Secondary | ICD-10-CM

## 2020-01-30 DIAGNOSIS — G8929 Other chronic pain: Secondary | ICD-10-CM

## 2020-01-30 DIAGNOSIS — M545 Low back pain, unspecified: Secondary | ICD-10-CM

## 2020-01-30 DIAGNOSIS — R2681 Unsteadiness on feet: Secondary | ICD-10-CM

## 2020-01-30 NOTE — Therapy (Signed)
[facility] [address], [place], [place], [address] Phone: [phone]   Fax:  [phone]  Physical Therapy Treatment  Patient Details  Name: Jacob [name] MRN: [id number] Date of Birth: 1940-10-11 Referring Provider (PT): Dr. Elberta Spaniel    Encounter Date: 01/30/2020   PT End of Session - 01/30/20 1302    Visit Number 21    Number of Visits 30    Date for PT Re-Evaluation 02/06/20    Authorization Type VA 15 additional visits 12/27/19-04/25/2020;    PT Start Time 1228    PT Stop Time 1308    PT Time Calculation (min) 40 min    Activity Tolerance Patient tolerated treatment well;Patient limited by pain;Patient limited by fatigue           Past Medical History:  Diagnosis Date  . Acute encephalopathy 08/29/2015  . Acute on chronic congestive heart failure ([place])   . Acute renal failure superimposed on stage 4 chronic kidney disease ([place]) 10/02/2015  . Adjustment disorder with mixed emotional features 09/01/2015  . AKI (acute kidney injury) ([place])   . Allergy   . Anemia   . Anxiety   . Arthritis   . Asthma   . BPH (benign prostatic hyperplasia)   . CAD (coronary artery disease) 12/19/2011  . Cancer of kidney ([place])   . Cataract   . CHF (congestive heart failure) ([place])   . Chronic anticoagulation 08/22/2018  . Chronic diastolic heart failure ([place]) 11/27/2015  . Chronic kidney disease    chronic  kidney failure  kidney function at 42%  . CKD (chronic kidney disease) 11/27/2015  . COPD (chronic obstructive pulmonary disease) ([place])   . Coronary artery disease    CABG  7 bypasses  . Dehydration   . Diabetes mellitus without complication ([place])   . Elevated troponin 08/29/2015  . Essential hypertension 07/12/2015  . Facial cellulitis   . GERD (gastroesophageal reflux disease)   . Gout   . Hepatitis    many years ago  . High risk medication use 08/22/2018  . History of CHF (congestive heart failure)   . HNP (herniated  nucleus pulposus), lumbar 07/24/2015  . Hyperlipidemia   . Hypertension   . Hypertensive heart disease 04/24/2018  . Hypokalemia   . Hypotension 10/02/2015  . Lumbar disc disease   . Lumbar radiculopathy 05/05/2018  . Neuropathy ([place]) 12/19/2011  . Normocytic anemia 11/27/2015  . Obesity   . On amiodarone therapy 08/22/2018  . OSA (obstructive sleep apnea) 07/27/2015  . Paroxysmal atrial fibrillation (HCC)   . Peripheral neuropathy   . PMR (polymyalgia rheumatica) ([place]) 09/09/2013  . Polymyalgia rheumatica (HCC)    maintained on Prednisone, Plaquenil. Followed by rhuematology every 4 months/[name].  . Renal cell carcinoma ([place]) 07/23/2015  . Renal mass 07/12/2015  . Rheumatoid arthritis ([place]) 04/13/2012  . Shortness of breath dyspnea    with exertion  . Sleep apnea    CPAP   Trying to use  . Spinal stenosis of lumbar region with radiculopathy 08/07/2015  . Type 2 diabetes mellitus with other specified complication ([place]) 24/06/6284    Past Surgical History:  Procedure Laterality Date  . APPENDECTOMY    . BIOPSY  01/23/2019   Procedure: BIOPSY;  Surgeon: Otis Brace, MD;  [facility]: [facility];  Service: Gastroenterology;;  . [place] 2000   left  . CATARACT EXTRACTION W/PHACO Right 11/28/2014   Procedure: CATARACT EXTRACTION PHACO AND INTRAOCULAR LENS PLACEMENT (IOC) RIGHT ;  Surgeon: Marylynn Pearson, MD;  Location: [facility];  Service: Ophthalmology;  Laterality: Right;  . CORONARY ARTERY BYPASS GRAFT  03/17/1995   [place] Lawman; followed every six months.  . ESOPHAGOGASTRODUODENOSCOPY (EGD) WITH PROPOFOL Left 01/23/2019   Procedure: ESOPHAGOGASTRODUODENOSCOPY (EGD) WITH PROPOFOL;  Surgeon: Otis Brace, MD;  [facility];  Service: Gastroenterology;  Laterality: Left;  . HERNIA REPAIR    . LUMBAR LAMINECTOMY/DECOMPRESSION MICRODISCECTOMY N/A 07/30/2015   Procedure: LUMBAR LAMINECTOMY DISCECTOMY ;  Surgeon: Ashok Pall, MD;  Location: [place] [facility];  Service:  Neurosurgery;  Laterality: N/A;  LUMBAR LAMINECTOMY DISCECTOMY   . LUMBAR LAMINECTOMY/DECOMPRESSION MICRODISCECTOMY N/A 09/06/2015   Procedure: Redo L3/4 Disectomy;  Surgeon: Ashok Pall, MD;  Location: [facility];  Service: Neurosurgery;  Laterality: N/A;  . PROSTATE SURGERY     TURP at [place].  Marland Kitchen TRANSURETHRAL RESECTION OF PROSTATE      There were no vitals filed for this visit.   Subjective Assessment - 01/30/20 1230    Subjective My neck, shoulders, arms and hands are giving me a fit today.  It's flared up.  Normally I would increase my prednisone but my kidney doctor doesn't want me to do that.  I try to walk my 80 foot hallway but I walked 60 this past weekend and almost fell when my legs gave way.  My wife had to help me to my chair.    Pertinent History had HHPT 2 months ago after hospitalized 5 days related to kidney    How long can you stand comfortably? sits in chair in shower with wife's assist    How long can you walk comfortably? no where comfortably, need RW to walk apartment to car 100 feet    Patient Stated Goals walk better, get around better    Currently in Pain? Yes    Pain Score 6    it hurts all the time   Pain Location Back                             [facility] Adult PT Treatment/Exercise - 01/30/20 0001      Lumbar Exercises: Standing   Other Standing Lumbar Exercises small push ups on treadmill railing 10x       Lumbar Exercises: Seated   Other Seated Lumbar Exercises 5# wt shoulder to shoulder 10x      Knee/Hip Exercises: Aerobic   Nustep L2 10 min while discussing progress      Knee/Hip Exercises: Standing   Other Standing Knee Exercises step taps 10x       Knee/Hip Exercises: Seated   Long Arc Quad Strengthening;Right;Left;15 reps;Weights    Long Arc Quad Weight 4 lbs.    Clamshell with TheraBand --   black loop 3x10   Knee/Hip Flexion 5# weight on knee 10x    very difficult   Other Seated Knee/Hip Exercises heel & toe raise with 5#  resting on knee 30x right/left                     PT Short Term Goals - 12/12/19 2140      PT SHORT TERM GOAL #1   Title Pt will be independent with his initial HEP to improve flexibility, strength and balance.     Status Partially Met      PT SHORT TERM GOAL #2   Title perform 5x sit to stand in < or = to 25 seconds with UE support to  improve balance    Status Achieved      PT SHORT TERM GOAL #3   Title Timed up and Go time improved to 29 sec or less    Status Achieved      PT SHORT TERM GOAL #4   Title The patient will be able to walk 200 feet with RW needed for improved short distance community mobility    Status Deferred             PT Long Term Goals - 12/12/19 2141      PT LONG TERM GOAL #1   Title Pt will demo independence with his advanced HEP to futher promote strength gains after d/c from PT.    Time 8    Period Weeks    Status On-going    Target Date 02/06/20      PT LONG TERM GOAL #2   Title Pt will demo improved functional strength and power of the LEs evident by his ability to complete 5x/sit to stand in less than 22 sec with moderate UE use.    Time 8    Status Achieved      PT LONG TERM GOAL #3   Title The patient will be able to stand for 30 sec with minimal UE support without loss of balance needed for personal care/grooming    Time 8    Period Weeks    Status Revised      PT LONG TERM GOAL #4   Title BERG balance score improved to 17/56    Time 8    Period Weeks    Status Revised      PT LONG TERM GOAL #5   Title Timed up and go improved to 22 sec indicating improved gait speed and safety    Time 8    Period Weeks                 Plan - 01/30/20 1319    Clinical Impression Statement The patient is limited with exercise tolerance secondary to multi regional pain and right > left LE weakness/giveway.  Within 30 sec of standing, he becomes progressively more bent over.  Therapist closely monitoring response and modifying  based on his pain and fatigue level.  Close supervision also provided for safety given his increased risk of falls.    Comorbidities COPD; peripheral neuropathy; renal failure; diabetes; heart disease;  chronic LBP    Rehab Potential Good    PT Frequency 2x / week    PT Duration 12 weeks    PT Treatment/Interventions ADLs/Self Care Home Management;Moist Heat;Neuromuscular re-education;Balance training;Therapeutic exercise;Therapeutic activities;Functional mobility training;Gait training;Patient/family education;Manual techniques;Dry needling    PT Next Visit Plan Nu-Step;  seated and standing low level trunk and LE strengthening;  recert in 1 week           Patient will benefit from skilled therapeutic intervention in order to improve the following deficits and impairments:  Difficulty walking, Decreased endurance, Decreased activity tolerance, Impaired perceived functional ability, Pain, Decreased balance, Decreased mobility, Decreased strength, Postural dysfunction  Visit Diagnosis: Muscle weakness (generalized)  Unsteadiness on feet  Chronic low back pain, unspecified back pain laterality, unspecified whether sciatica present  Other abnormalities of gait and mobility     Problem List Patient Active Problem List   Diagnosis Date Noted  . Sleep apnea   . Polymyalgia rheumatica ([place])   . Peripheral neuropathy   . Obesity   . Lumbar disc disease   . Hypertension   .  Hyperlipidemia   . Hepatitis   . Gout   . Diabetes mellitus without complication ([place])   . Coronary artery disease   . Chronic kidney disease   . CHF (congestive heart failure) ([place])   . Cataract   . Cancer of kidney ([place])   . BPH (benign prostatic hyperplasia)   . Asthma   . Arthritis   . Anxiety   . Anemia   . Allergy   . Other chronic pain   . Intractable nausea and vomiting 01/19/2019  . Intractable vomiting   . Hypokalemia   . GERD (gastroesophageal reflux disease) 11/07/2018  . History of CHF  (congestive heart failure)   . Facial cellulitis   . Acute on chronic congestive heart failure ([place])   . Gait abnormality 11/02/2018  . Cellulitis 11/01/2018  . Paroxysmal atrial fibrillation ([place]) 08/22/2018  . High risk medication use 08/22/2018  . Chronic anticoagulation 08/22/2018  . On amiodarone therapy 08/22/2018  . Lumbar radiculopathy 05/05/2018  . Hypertensive heart disease 04/24/2018  . CKD (chronic kidney disease) 11/27/2015  . Chronic diastolic heart failure ([place]) 11/27/2015  . Normocytic anemia 11/27/2015  . AKI (acute kidney injury) ([place])   . Acute renal failure superimposed on stage 4 chronic kidney disease ([place]) 10/02/2015  . Hypotension 10/02/2015  . Anorexia   . Adjustment disorder with mixed emotional features 09/01/2015  . Acute encephalopathy 08/29/2015  . Elevated troponin 08/29/2015  . Renal cancer ([place]) 08/28/2015  . Spinal stenosis of lumbar region with radiculopathy 08/07/2015  . COPD (chronic obstructive pulmonary disease) ([place])   . OSA (obstructive sleep apnea) 07/27/2015  . HNP (herniated nucleus pulposus), lumbar 07/24/2015  . Renal cell carcinoma ([place]) 07/23/2015  . Essential hypertension 07/12/2015  . Renal mass 07/12/2015  . PMR (polymyalgia rheumatica) ([place]) 09/09/2013  . Rheumatoid arthritis ([place]) 04/13/2012  . CAD (coronary artery disease) 12/19/2011  . Type 2 diabetes mellitus with other specified complication ([place]) 95/18/8416  . Neuropathy ([place]) 12/19/2011   Ruben Im, PT 01/30/20 2:03 PM Phone: [phone] Fax: [phone] Alvera Singh 01/30/2020, 2:02 PM  [facility] [address], [place], [place], [address] Phone: [phone]   Fax:  [phone]  Name: Jacob [name] MRN: [id number] Date of Birth: 08-19-1940

## 2020-01-31 ENCOUNTER — Other Ambulatory Visit: Payer: Self-pay

## 2020-01-31 ENCOUNTER — Telehealth: Payer: Self-pay | Admitting: Cardiology

## 2020-01-31 DIAGNOSIS — E785 Hyperlipidemia, unspecified: Secondary | ICD-10-CM

## 2020-01-31 DIAGNOSIS — I480 Paroxysmal atrial fibrillation: Secondary | ICD-10-CM

## 2020-01-31 NOTE — Progress Notes (Signed)
Referral placed to [facility] Neurology. Daughter made aware of the patients recent lab results and agreeable to plan of patient seeing Neurology before instituting a lipid-lowering medication for the patient.

## 2020-01-31 NOTE — Telephone Encounter (Signed)
Patient's daughter is requesting to review results/recommendation from lipid panel completed on 01/23/20. Please call.

## 2020-02-01 ENCOUNTER — Ambulatory Visit: Payer: No Typology Code available for payment source | Admitting: Physical Therapy

## 2020-02-01 ENCOUNTER — Other Ambulatory Visit: Payer: Self-pay

## 2020-02-01 ENCOUNTER — Telehealth: Payer: Self-pay | Admitting: Cardiology

## 2020-02-01 DIAGNOSIS — R2681 Unsteadiness on feet: Secondary | ICD-10-CM

## 2020-02-01 DIAGNOSIS — R2689 Other abnormalities of gait and mobility: Secondary | ICD-10-CM

## 2020-02-01 DIAGNOSIS — M6281 Muscle weakness (generalized): Secondary | ICD-10-CM | POA: Diagnosis not present

## 2020-02-01 DIAGNOSIS — G8929 Other chronic pain: Secondary | ICD-10-CM

## 2020-02-01 DIAGNOSIS — M545 Low back pain, unspecified: Secondary | ICD-10-CM

## 2020-02-01 NOTE — Telephone Encounter (Signed)
New Message:    Please call, question about this pt's referral.

## 2020-02-01 NOTE — Telephone Encounter (Signed)
Left a message on the voicemail at [facility]. I left my name and number for them to call back if they had any questions or concerns that I could help them with.

## 2020-02-01 NOTE — Therapy (Signed)
[facility] [address] [place], [place], [address] Phone: [phone]   Fax:  [phone]  Physical Therapy Treatment  Patient Details  Name: Jacob [name] MRN: [id number] Date of Birth: [birth year] Referring Provider (PT): Dr. Elberta Spaniel    Encounter Date: 02/01/2020   PT End of Session - 02/01/20 1306    Visit Number 22    Number of Visits 30    Date for PT Re-Evaluation 02/06/20    Authorization Type VA 15 additional visits 12/27/19-04/25/2020;    PT Start Time 1227    PT Stop Time 1312    PT Time Calculation (min) 45 min    Activity Tolerance Patient tolerated treatment well;Patient limited by pain;Patient limited by fatigue           Past Medical History:  Diagnosis Date  . Acute encephalopathy 08/29/2015  . Acute on chronic congestive heart failure ([place])   . Acute renal failure superimposed on stage 4 chronic kidney disease ([place]) 10/02/2015  . Adjustment disorder with mixed emotional features 09/01/2015  . AKI (acute kidney injury) ([place])   . Allergy   . Anemia   . Anxiety   . Arthritis   . Asthma   . BPH (benign prostatic hyperplasia)   . CAD (coronary artery disease) 12/19/2011  . Cancer of kidney ([place])   . Cataract   . CHF (congestive heart failure) ([place])   . Chronic anticoagulation 08/22/2018  . Chronic diastolic heart failure ([place]) 11/27/2015  . Chronic kidney disease    chronic  kidney failure  kidney function at 42%  . CKD (chronic kidney disease) 11/27/2015  . COPD (chronic obstructive pulmonary disease) ([place])   . Coronary artery disease    CABG  7 bypasses  . Dehydration   . Diabetes mellitus without complication ([place])   . Elevated troponin 08/29/2015  . Essential hypertension 07/12/2015  . Facial cellulitis   . GERD (gastroesophageal reflux disease)   . Gout   . Hepatitis    many years ago  . High risk medication use 08/22/2018  . History of CHF (congestive heart failure)   . HNP (herniated  nucleus pulposus), lumbar 07/24/2015  . Hyperlipidemia   . Hypertension   . Hypertensive heart disease 04/24/2018  . Hypokalemia   . Hypotension 10/02/2015  . Lumbar disc disease   . Lumbar radiculopathy 05/05/2018  . Neuropathy ([place]) 12/19/2011  . Normocytic anemia 11/27/2015  . Obesity   . On amiodarone therapy 08/22/2018  . OSA (obstructive sleep apnea) 07/27/2015  . Paroxysmal atrial fibrillation (HCC)   . Peripheral neuropathy   . PMR (polymyalgia rheumatica) ([place]) 09/09/2013  . Polymyalgia rheumatica (HCC)    maintained on Prednisone, Plaquenil. Followed by rhuematology every 4 months/[name].  . Renal cell carcinoma ([place]) 07/23/2015  . Renal mass 07/12/2015  . Rheumatoid arthritis ([place]) 04/13/2012  . Shortness of breath dyspnea    with exertion  . Sleep apnea    CPAP   Trying to use  . Spinal stenosis of lumbar region with radiculopathy 08/07/2015  . Type 2 diabetes mellitus with other specified complication ([place]) 69/08/2950    Past Surgical History:  Procedure Laterality Date  . APPENDECTOMY    . BIOPSY  01/23/2019   Procedure: BIOPSY;  Surgeon: Otis Brace, MD;  Location: [facility];  Service: Gastroenterology;;  . [place] 2000   left  . CATARACT EXTRACTION W/PHACO Right 11/28/2014   Procedure: CATARACT EXTRACTION PHACO AND INTRAOCULAR LENS PLACEMENT (IOC) RIGHT ;  Surgeon: Marylynn Pearson, MD;  Location: [place];  Service: Ophthalmology;  Laterality: Right;  . CORONARY ARTERY BYPASS GRAFT  03/17/1995   Wynonia Lawman; followed every six months.  . ESOPHAGOGASTRODUODENOSCOPY (EGD) WITH PROPOFOL Left 01/23/2019   Procedure: ESOPHAGOGASTRODUODENOSCOPY (EGD) WITH PROPOFOL;  Surgeon: Otis Brace, MD;  Location: [facility];  Service: Gastroenterology;  Laterality: Left;  . HERNIA REPAIR    . LUMBAR LAMINECTOMY/DECOMPRESSION MICRODISCECTOMY N/A 07/30/2015   Procedure: LUMBAR LAMINECTOMY DISCECTOMY ;  Surgeon: Ashok Pall, MD;  Location: [place] [facility];  Service:  Neurosurgery;  Laterality: N/A;  LUMBAR LAMINECTOMY DISCECTOMY   . LUMBAR LAMINECTOMY/DECOMPRESSION MICRODISCECTOMY N/A 09/06/2015   Procedure: Redo L3/4 Disectomy;  Surgeon: Ashok Pall, MD;  Location: [place] [facility];  Service: Neurosurgery;  Laterality: N/A;  . PROSTATE SURGERY     TURP at [place].  Marland Kitchen TRANSURETHRAL RESECTION OF PROSTATE      There were no vitals filed for this visit.   Subjective Assessment - 02/01/20 1235    Subjective My back is not too bad today.  I did a lot of walking at the [place] yesterday.  My legs were worn out.  Today my legs are a little more tired.  Supposed to get a motorized scooter in March.    Pertinent History had HHPT 2 months ago after hospitalized 5 days related to kidney    Currently in Pain? Yes    Pain Score 6     Pain Location Back                             [facility] Adult PT Treatment/Exercise - 02/01/20 0001      Lumbar Exercises: Standing   Other Standing Lumbar Exercises small push ups on treadmill railing 10x     Other Standing Lumbar Exercises UE reaches 10x right/left alternating       Lumbar Exercises: Seated   Other Seated Lumbar Exercises --      Knee/Hip Exercises: Aerobic   Nustep L2 10 min while discussing progress      Knee/Hip Exercises: Standing   Heel Raises Both;10 reps    Hip Abduction AROM;Right;Left;5 reps    Other Standing Knee Exercises step taps 15x     Other Standing Knee Exercises HS curls 8x right/left       Knee/Hip Exercises: Seated   Long Arc Quad --    Long Arc [employer] --    Clamshell with TheraBand --    Knee/Hip Flexion --    Other Seated Knee/Hip Exercises 6 inch step taps seated 15x right/left     Sit to Sand 2 sets;5 reps;with UE support   1 hand on treadmill railing      Shoulder Exercises: Seated   Extension Strengthening;Both;15 reps;Theraband    Theraband Level (Shoulder Extension) Level 4 (Blue)    Row Strengthening;Both;20 reps;Theraband    Theraband Level (Shoulder Row)  Level 4 (Blue)                    PT Short Term Goals - 12/12/19 2140      PT SHORT TERM GOAL #1   Title Pt will be independent with his initial HEP to improve flexibility, strength and balance.     Status Partially Met      PT SHORT TERM GOAL #2   Title perform 5x sit to stand in < or = to 25 seconds with UE support to improve balance    Status Achieved  PT SHORT TERM GOAL #3   Title Timed up and Go time improved to 29 sec or less    Status Achieved      PT SHORT TERM GOAL #4   Title The patient will be able to walk 200 feet with RW needed for improved short distance community mobility    Status Deferred             PT Long Term Goals - 12/12/19 2141      PT LONG TERM GOAL #1   Title Pt will demo independence with his advanced HEP to futher promote strength gains after d/c from PT.    Time 8    Period Weeks    Status On-going    Target Date 02/06/20      PT LONG TERM GOAL #2   Title Pt will demo improved functional strength and power of the LEs evident by his ability to complete 5x/sit to stand in less than 22 sec with moderate UE use.    Time 8    Status Achieved      PT LONG TERM GOAL #3   Title The patient will be able to stand for 30 sec with minimal UE support without loss of balance needed for personal care/grooming    Time 8    Period Weeks    Status Revised      PT LONG TERM GOAL #4   Title BERG balance score improved to 17/56    Time 8    Period Weeks    Status Revised      PT LONG TERM GOAL #5   Title Timed up and go improved to 22 sec indicating improved gait speed and safety    Time 8    Period Weeks                 Plan - 02/01/20 2057    Clinical Impression Statement The patient is able to perform alternating standing/sitting exercises.  His standing tolerance is about 90 sec limited by LBP as well as LE fatigue/give way.  He becomes progressively more bent over with standing as well.  He puts forth good effort with  exercises in the clinic although he admits he doesn't ex as much as he should at home.  Therapist closely monitoring response and modifying treatment accordingly.    Comorbidities COPD; peripheral neuropathy; renal failure; diabetes; heart disease;  chronic LBP    Examination-Activity Limitations Bathing;Locomotion Level;Transfers;Bend;Carry;Dressing;Lift;Stand;Hygiene/Grooming    Examination-Participation Restrictions Community Activity;Interpersonal Relationship;Other;Meal Prep    Rehab Potential Good    PT Frequency 2x / week    PT Duration 12 weeks    PT Treatment/Interventions ADLs/Self Care Home Management;Moist Heat;Neuromuscular re-education;Balance training;Therapeutic exercise;Therapeutic activities;Functional mobility training;Gait training;Patient/family education;Manual techniques;Dry needling    PT Next Visit Plan Nu-Step;  seated and standing low level trunk and LE strengthening;  recert in 1 week           Patient will benefit from skilled therapeutic intervention in order to improve the following deficits and impairments:  Difficulty walking, Decreased endurance, Decreased activity tolerance, Impaired perceived functional ability, Pain, Decreased balance, Decreased mobility, Decreased strength, Postural dysfunction  Visit Diagnosis: Muscle weakness (generalized)  Unsteadiness on feet  Chronic low back pain, unspecified back pain laterality, unspecified whether sciatica present  Other abnormalities of gait and mobility     Problem List Patient Active Problem List   Diagnosis Date Noted  . Sleep apnea   . Polymyalgia rheumatica ([place])   .  Peripheral neuropathy   . Obesity   . Lumbar disc disease   . Hypertension   . Hyperlipidemia   . Hepatitis   . Gout   . Diabetes mellitus without complication ([place])   . Coronary artery disease   . Chronic kidney disease   . CHF (congestive heart failure) ([place])   . Cataract   . Cancer of kidney ([place])   . BPH (benign  prostatic hyperplasia)   . Asthma   . Arthritis   . Anxiety   . Anemia   . Allergy   . Other chronic pain   . Intractable nausea and vomiting 01/19/2019  . Intractable vomiting   . Hypokalemia   . GERD (gastroesophageal reflux disease) 11/07/2018  . History of CHF (congestive heart failure)   . Facial cellulitis   . Acute on chronic congestive heart failure ([place])   . Gait abnormality 11/02/2018  . Cellulitis 11/01/2018  . Paroxysmal atrial fibrillation ([place]) 08/22/2018  . High risk medication use 08/22/2018  . Chronic anticoagulation 08/22/2018  . On amiodarone therapy 08/22/2018  . Lumbar radiculopathy 05/05/2018  . Hypertensive heart disease 04/24/2018  . CKD (chronic kidney disease) 11/27/2015  . Chronic diastolic heart failure ([place]) 11/27/2015  . Normocytic anemia 11/27/2015  . AKI (acute kidney injury) ([place])   . Acute renal failure superimposed on stage 4 chronic kidney disease ([place]) 10/02/2015  . Hypotension 10/02/2015  . Anorexia   . Adjustment disorder with mixed emotional features 09/01/2015  . Acute encephalopathy 08/29/2015  . Elevated troponin 08/29/2015  . Renal cancer ([place]) 08/28/2015  . Spinal stenosis of lumbar region with radiculopathy 08/07/2015  . COPD (chronic obstructive pulmonary disease) ([place])   . OSA (obstructive sleep apnea) 07/27/2015  . HNP (herniated nucleus pulposus), lumbar 07/24/2015  . Renal cell carcinoma ([place]) 07/23/2015  . Essential hypertension 07/12/2015  . Renal mass 07/12/2015  . PMR (polymyalgia rheumatica) ([place]) 09/09/2013  . Rheumatoid arthritis ([place]) 04/13/2012  . CAD (coronary artery disease) 12/19/2011  . Type 2 diabetes mellitus with other specified complication ([place]) 67/34/1937  . Neuropathy ([place]) 12/19/2011   Ruben Im, PT 02/01/20 9:03 PM Phone: [phone] Fax: [phone]  Alvera Singh 02/01/2020, 9:02 PM   [facility] [address], [place], [place], [address] Phone: [phone]   Fax:  [phone]  Name: Jacob [name] MRN: [id number] Date of Birth: 06/27/1940

## 2020-02-06 ENCOUNTER — Ambulatory Visit: Payer: No Typology Code available for payment source | Admitting: Physical Therapy

## 2020-02-06 ENCOUNTER — Other Ambulatory Visit: Payer: Self-pay

## 2020-02-06 DIAGNOSIS — G8929 Other chronic pain: Secondary | ICD-10-CM

## 2020-02-06 DIAGNOSIS — M545 Low back pain, unspecified: Secondary | ICD-10-CM

## 2020-02-06 DIAGNOSIS — R2681 Unsteadiness on feet: Secondary | ICD-10-CM

## 2020-02-06 DIAGNOSIS — M6281 Muscle weakness (generalized): Secondary | ICD-10-CM

## 2020-02-06 DIAGNOSIS — R2689 Other abnormalities of gait and mobility: Secondary | ICD-10-CM

## 2020-02-06 NOTE — Therapy (Addendum)
[facility] [address] [place], [place], [address] Phone: [phone]   Fax:  [phone]  Physical Therapy Treatment/Recertification   Patient Details  Name: Jacob [name] MRN: [id number] Date of Birth: 08-14-1940 Referring Provider (PT): Dr. Elberta Spaniel    Encounter Date: 02/06/2020   PT End of Session - 02/06/20 1308    Visit Number 23    Number of Visits 30    Date for PT Re-Evaluation 04/02/20    Authorization Type VA 15 additional visits 12/27/19-04/25/2020;    PT Start Time 1230    PT Stop Time 1311    PT Time Calculation (min) 41 min    Activity Tolerance Patient tolerated treatment well;Patient limited by pain;Patient limited by fatigue           Past Medical History:  Diagnosis Date  . Acute encephalopathy 08/29/2015  . Acute on chronic congestive heart failure ([place])   . Acute renal failure superimposed on stage 4 chronic kidney disease ([place]) 10/02/2015  . Adjustment disorder with mixed emotional features 09/01/2015  . AKI (acute kidney injury) ([place])   . Allergy   . Anemia   . Anxiety   . Arthritis   . Asthma   . BPH (benign prostatic hyperplasia)   . CAD (coronary artery disease) 12/19/2011  . Cancer of kidney ([place])   . Cataract   . CHF (congestive heart failure) ([place])   . Chronic anticoagulation 08/22/2018  . Chronic diastolic heart failure ([place]) 11/27/2015  . Chronic kidney disease    chronic  kidney failure  kidney function at 42%  . CKD (chronic kidney disease) 11/27/2015  . COPD (chronic obstructive pulmonary disease) ([place])   . Coronary artery disease    CABG  7 bypasses  . Dehydration   . Diabetes mellitus without complication ([place])   . Elevated troponin 08/29/2015  . Essential hypertension 07/12/2015  . Facial cellulitis   . GERD (gastroesophageal reflux disease)   . Gout   . Hepatitis    many years ago  . High risk medication use 08/22/2018  . History of CHF (congestive heart failure)    . HNP (herniated nucleus pulposus), lumbar 07/24/2015  . Hyperlipidemia   . Hypertension   . Hypertensive heart disease 04/24/2018  . Hypokalemia   . Hypotension 10/02/2015  . Lumbar disc disease   . Lumbar radiculopathy 05/05/2018  . Neuropathy ([place]) 12/19/2011  . Normocytic anemia 11/27/2015  . Obesity   . On amiodarone therapy 08/22/2018  . OSA (obstructive sleep apnea) 07/27/2015  . Paroxysmal atrial fibrillation (HCC)   . Peripheral neuropathy   . PMR (polymyalgia rheumatica) ([place]) 09/09/2013  . Polymyalgia rheumatica (HCC)    maintained on Prednisone, Plaquenil. Followed by rhuematology every 4 months/[name].  . Renal cell carcinoma ([place]) 07/23/2015  . Renal mass 07/12/2015  . Rheumatoid arthritis ([place]) 04/13/2012  . Shortness of breath dyspnea    with exertion  . Sleep apnea    CPAP   Trying to use  . Spinal stenosis of lumbar region with radiculopathy 08/07/2015  . Type 2 diabetes mellitus with other specified complication ([place]) 85/0/2774    Past Surgical History:  Procedure Laterality Date  . APPENDECTOMY    . BIOPSY  01/23/2019   Procedure: BIOPSY;  Surgeon: Otis Brace, MD;  Location: [facility];  Service: Gastroenterology;;  . [place] 2000   left  . CATARACT EXTRACTION W/PHACO Right 11/28/2014   Procedure: CATARACT EXTRACTION PHACO AND INTRAOCULAR LENS PLACEMENT (IOC)  RIGHT ;  Surgeon: Marylynn Pearson, MD;  Location: [facility];  Service: Ophthalmology;  Laterality: Right;  . CORONARY ARTERY BYPASS GRAFT  03/17/1995   [place] Lawman; followed every six months.  . ESOPHAGOGASTRODUODENOSCOPY (EGD) WITH PROPOFOL Left 01/23/2019   Procedure: ESOPHAGOGASTRODUODENOSCOPY (EGD) WITH PROPOFOL;  Surgeon: Otis Brace, MD;  Location: [facility];  Service: Gastroenterology;  Laterality: Left;  . HERNIA REPAIR    . LUMBAR LAMINECTOMY/DECOMPRESSION MICRODISCECTOMY N/A 07/30/2015   Procedure: LUMBAR LAMINECTOMY DISCECTOMY ;  Surgeon: Ashok Pall, MD;  Location: [facility];  Service: Neurosurgery;  Laterality: N/A;  LUMBAR LAMINECTOMY DISCECTOMY   . LUMBAR LAMINECTOMY/DECOMPRESSION MICRODISCECTOMY N/A 09/06/2015   Procedure: Redo L3/4 Disectomy;  Surgeon: Ashok Pall, MD;  Location: [facility];  Service: Neurosurgery;  Laterality: N/A;  . PROSTATE SURGERY     TURP at [place].  Marland Kitchen TRANSURETHRAL RESECTION OF PROSTATE      There were no vitals filed for this visit.   Subjective Assessment - 02/06/20 1252    Subjective The day after PT, I woke up in terrible back,  leg pain and foot pain.  My arms even hurt.  It's different than it has been before.  I see the neurologist in December. It even hurts sitting down.    Pertinent History had HHPT 2 months ago after hospitalized 5 days related to kidney    Limitations Walking;Standing;House hold activities    How long can you walk comfortably? no where comfortably, need RW to walk apartment to car 100 feet    Currently in Pain? Yes    Pain Score 8     Pain Location Back    Pain Orientation Right;Left    Pain Type Chronic pain    Pain Radiating Towards bil LES              [facility] PT Assessment - 02/06/20 0001      Berg Balance Test   Sit to Stand Needs minimal aid to stand or to stabilize    Standing Unsupported Unable to stand 30 seconds unassisted    Sitting with Back Unsupported but Feet Supported on Floor or Stool Able to sit safely and securely 2 minutes    Stand to Sit Controls descent by using hands    Transfers Able to transfer safely, definite need of hands    Standing Unsupported with Eyes Closed Needs help to keep from falling    Standing Unsupported with Feet Together Needs help to attain position and unable to hold for 15 seconds    From Standing, Reach Forward with Outstretched Arm Reaches forward but needs supervision    From Standing Position, Pick up Object from Floor Unable to try/needs assist to keep balance    From Standing Position, Turn to Look Behind Over each Shoulder Needs supervision  when turning    Turn 360 Degrees Needs assistance while turning    Standing Unsupported, Alternately Place Feet on Step/Stool Needs assistance to keep from falling or unable to try    Standing Unsupported, One Foot in [employer] balance while stepping or standing    Standing on One Leg Unable to try or needs assist to prevent fall    Total Score 13      Timed Up and Go Test   Normal TUG (seconds) 23.16                         [facility] Adult PT Treatment/Exercise - 02/06/20 0001  Lumbar Exercises: Seated   Other Seated Lumbar Exercises blue band rows 20x     Other Seated Lumbar Exercises blue band shoulder extension 15x      Knee/Hip Exercises: Aerobic   Nustep L1 8 min       Knee/Hip Exercises: Standing   Hip Abduction AROM;Right;Left;5 reps    Other Standing Knee Exercises step taps 5x       Knee/Hip Exercises: Seated   Long Arc Quad AROM;Right;Left;10 reps    Clamshell with TheraBand --   black band 20x    Knee/Hip Flexion seated marching 10x     Other Seated Knee/Hip Exercises heel/toe raises  15x                   PT Short Term Goals - 02/06/20 1709      PT SHORT TERM GOAL #1   Title Pt will be independent with his initial HEP to improve flexibility, strength and balance.     Status Partially Met      PT SHORT TERM GOAL #2   Title perform 5x sit to stand in < or = to 25 seconds with UE support to improve balance    Status Achieved      PT SHORT TERM GOAL #3   Title Timed up and Go time improved to 29 sec or less    Status Achieved      PT SHORT TERM GOAL #4   Title The patient will be able to walk 200 feet with RW needed for improved short distance community mobility    Status Deferred             PT Long Term Goals - 02/06/20 1709      PT LONG TERM GOAL #1   Title Pt will demo independence with his advanced HEP to futher promote strength gains after d/c from PT.    Time 8    Period Weeks    Status On-going    Target Date  04/02/20      PT LONG TERM GOAL #2   Title Pt will demo improved functional strength and power of the LEs evident by his ability to complete 5x/sit to stand in less than 22 sec with moderate UE use.    Status Achieved      PT LONG TERM GOAL #3   Title The patient will be able to stand for 30 sec with minimal UE support without loss of balance needed for personal care/grooming    Time 8    Period Weeks    Status On-going      PT LONG TERM GOAL #4   Title BERG balance score improved to 17/56    Time 8    Period Weeks    Status On-going      PT LONG TERM GOAL #5   Time 8    Period Weeks    Status On-going                 Plan - 02/06/20 1659    Clinical Impression Statement The patient reports an exacerbation of not only his back and legs but also UE pain.  Treatment modified in response to pain and sensation of weakness.  He is still at very high risk of falls with several occurrences of knee give way and falls over the last several weeks.  Discussed with patient and his wife my recommendation for follow up with a medical provider regarding these symptoms.  He will be seeing  a neurologist in [place] in December.  The patient may have limited improvement however the patient's condition requires further skilled care to maintain or slow deterioration.    Personal Factors and Comorbidities Age;Fitness;Comorbidity 1;Comorbidity 2;Comorbidity 3+;Time since onset of injury/illness/exacerbation    Comorbidities COPD; peripheral neuropathy; renal failure; diabetes; heart disease;  chronic LBP    Examination-Activity Limitations Bathing;Locomotion Level;Transfers;Bend;Carry;Dressing;Lift;Stand;Hygiene/Grooming    Examination-Participation Restrictions Community Activity;Interpersonal Relationship;Other;Meal Prep    Stability/Clinical Decision Making Evolving/Moderate complexity    Rehab Potential Good    PT Frequency 2x / week    PT Duration 12 weeks    PT Treatment/Interventions  ADLs/Self Care Home Management;Moist Heat;Neuromuscular re-education;Balance training;Therapeutic exercise;Therapeutic activities;Functional mobility training;Gait training;Patient/family education;Manual techniques;Dry needling    PT Next Visit Plan Nu-Step;  seated and standing low level trunk and LE strengthening    Consulted and Agree with Plan of Care Patient           Patient will benefit from skilled therapeutic intervention in order to improve the following deficits and impairments:  Difficulty walking, Decreased endurance, Decreased activity tolerance, Impaired perceived functional ability, Pain, Decreased balance, Decreased mobility, Decreased strength, Postural dysfunction  Visit Diagnosis: Muscle weakness (generalized) - Plan: PT plan of care cert/re-cert  Unsteadiness on feet - Plan: PT plan of care cert/re-cert  Chronic low back pain, unspecified back pain laterality, unspecified whether sciatica present - Plan: PT plan of care cert/re-cert  Other abnormalities of gait and mobility - Plan: PT plan of care cert/re-cert    PHYSICAL THERAPY DISCHARGE SUMMARY  Visits from Start of Care: 23  Current functional level related to goals / functional outcomes: The patient's wife called in early December stating the patient would be undergoing some tests secondary to progressive weakness and severe back pain.  PT has been on hold since that time.  Will discharge from PT at this time.     Remaining deficits: As above   Education / Equipment: HEP Plan:                                                    Patient goals were not met. Patient is being discharged due to a change in medical status.  ?????         Problem List Patient Active Problem List   Diagnosis Date Noted  . Sleep apnea   . Polymyalgia rheumatica ([place])   . Peripheral neuropathy   . Obesity   . Lumbar disc disease   . Hypertension   . Hyperlipidemia   . Hepatitis   . Gout   . Diabetes mellitus  without complication ([place])   . Coronary artery disease   . Chronic kidney disease   . CHF (congestive heart failure) ([place])   . Cataract   . Cancer of kidney ([place])   . BPH (benign prostatic hyperplasia)   . Asthma   . Arthritis   . Anxiety   . Anemia   . Allergy   . Other chronic pain   . Intractable nausea and vomiting 01/19/2019  . Intractable vomiting   . Hypokalemia   . GERD (gastroesophageal reflux disease) 11/07/2018  . History of CHF (congestive heart failure)   . Facial cellulitis   . Acute on chronic congestive heart failure ([place])   . Gait abnormality 11/02/2018  . Cellulitis 11/01/2018  . Paroxysmal atrial fibrillation (HCC)  08/22/2018  . High risk medication use 08/22/2018  . Chronic anticoagulation 08/22/2018  . On amiodarone therapy 08/22/2018  . Lumbar radiculopathy 05/05/2018  . Hypertensive heart disease 04/24/2018  . CKD (chronic kidney disease) 11/27/2015  . Chronic diastolic heart failure ([place]) 11/27/2015  . Normocytic anemia 11/27/2015  . AKI (acute kidney injury) ([place])   . Acute renal failure superimposed on stage 4 chronic kidney disease ([place]) 10/02/2015  . Hypotension 10/02/2015  . Anorexia   . Adjustment disorder with mixed emotional features 09/01/2015  . Acute encephalopathy 08/29/2015  . Elevated troponin 08/29/2015  . Renal cancer ([place]) 08/28/2015  . Spinal stenosis of lumbar region with radiculopathy 08/07/2015  . COPD (chronic obstructive pulmonary disease) ([place])   . OSA (obstructive sleep apnea) 07/27/2015  . HNP (herniated nucleus pulposus), lumbar 07/24/2015  . Renal cell carcinoma ([place]) 07/23/2015  . Essential hypertension 07/12/2015  . Renal mass 07/12/2015  . PMR (polymyalgia rheumatica) ([place]) 09/09/2013  . Rheumatoid arthritis ([place]) 04/13/2012  . CAD (coronary artery disease) 12/19/2011  . Type 2 diabetes mellitus with other specified complication ([place]) 72/09/2180  . Neuropathy ([place]) 12/19/2011   Ruben Im, PT 02/06/20 5:13  PM Phone: [phone] Fax: [phone] Alvera Singh 02/06/2020, 5:12 PM  [facility] [address], [place], [place], [address] Phone: [phone]   Fax:  [phone]  Name: Jacob [name] MRN: [id number] Date of Birth: 01/03/1941

## 2020-02-13 ENCOUNTER — Telehealth: Payer: Self-pay | Admitting: Cardiology

## 2020-02-13 ENCOUNTER — Ambulatory Visit: Payer: No Typology Code available for payment source | Admitting: Physical Therapy

## 2020-02-13 NOTE — Telephone Encounter (Signed)
[name] is calling to get patient's lab results. Says that she was told two different things. She says its very important for her to know what exactly is going on.

## 2020-02-13 NOTE — Telephone Encounter (Signed)
Spoke with the patients wife just now and she has many questions in regards to the patients care and the patients neurological symptoms. She states that she needs to discuss this with Dr. Bettina Gavia and would like to make an appointment with him. I go the patient scheduled for 02/23/20 at 10:40 AM. The wife states that she was told that something was going on in the patients brain. She is unsure what this means and is wondering what she needs to be doing. Upon reviewing Dr. Joya Gaskins last office visit note it appears that the patient had statin induced myopathy. I discussed this with the wife but she is still upset and has many questions. I advised that Dr. Bettina Gavia would discuss these questions with her during their office visit.

## 2020-02-15 ENCOUNTER — Ambulatory Visit: Payer: No Typology Code available for payment source | Admitting: Physical Therapy

## 2020-02-15 DIAGNOSIS — E860 Dehydration: Secondary | ICD-10-CM | POA: Insufficient documentation

## 2020-02-20 ENCOUNTER — Other Ambulatory Visit: Payer: Self-pay

## 2020-02-20 NOTE — Patient Outreach (Signed)
  [facility]) Care Management Chronic Special Needs Program    02/20/2020  Name: QUINTEZ MASELLI, DOB: 1940/03/26  MRN: [id number]   Mr. Daemien Fronczak is enrolled in a chronic special needs plan for Diabetes.  [facility] Management will continue to provide services for this member through 03/15/20.  The [facility] care management team will assume care 03/16/2020.   Quinn Plowman RN,BSN,CCM [facility] Management [phone]

## 2020-02-23 ENCOUNTER — Ambulatory Visit: Payer: HMO | Admitting: Cardiology

## 2020-03-07 ENCOUNTER — Telehealth: Payer: Self-pay | Admitting: Cardiology

## 2020-03-07 NOTE — Telephone Encounter (Signed)
I would increase the dose and seems like a good idea

## 2020-03-07 NOTE — Telephone Encounter (Signed)
Called patient wife informed her that Dr. Bettina Gavia agreed with increasing medication. Wife verbally understood no further questions.

## 2020-03-07 NOTE — Telephone Encounter (Signed)
Pt c/o medication issue:  1. Name of Medication:   hydrALAZINE (APRESOLINE) 25 MG tablet   2. How are you currently taking this medication (dosage and times per day)? 1 tablet by mouth two times daily   3. Are you having a reaction (difficulty breathing--STAT)? No   4. What is your medication issue? Jacob [name] is calling stating Jacob [name] went to see his kidney Doctor on 03/05/20 and she wanted to increase this medication to 50 MG's twice daily due to his BP being a little high. Jacob [name] states he has not started taking the increased dosage yet due to both her and their daughter feeling they should confirm this change is okay with Dr. Bettina Gavia first. Please advise.

## 2020-03-15 ENCOUNTER — Other Ambulatory Visit: Payer: Self-pay | Admitting: Family Medicine

## 2020-03-18 ENCOUNTER — Ambulatory Visit: Payer: HMO | Admitting: Cardiology

## 2020-03-19 ENCOUNTER — Other Ambulatory Visit: Payer: Self-pay

## 2020-03-19 ENCOUNTER — Ambulatory Visit (INDEPENDENT_AMBULATORY_CARE_PROVIDER_SITE_OTHER): Payer: HMO | Admitting: Cardiology

## 2020-03-19 ENCOUNTER — Encounter: Payer: Self-pay | Admitting: Cardiology

## 2020-03-19 VITALS — BP 146/76 | HR 60 | Ht 71.0 in | Wt 216.0 lb

## 2020-03-19 DIAGNOSIS — I5032 Chronic diastolic (congestive) heart failure: Secondary | ICD-10-CM | POA: Diagnosis not present

## 2020-03-19 DIAGNOSIS — I480 Paroxysmal atrial fibrillation: Secondary | ICD-10-CM

## 2020-03-19 DIAGNOSIS — Z7901 Long term (current) use of anticoagulants: Secondary | ICD-10-CM | POA: Diagnosis not present

## 2020-03-19 DIAGNOSIS — N184 Chronic kidney disease, stage 4 (severe): Secondary | ICD-10-CM | POA: Diagnosis not present

## 2020-03-19 DIAGNOSIS — I25118 Atherosclerotic heart disease of native coronary artery with other forms of angina pectoris: Secondary | ICD-10-CM | POA: Diagnosis not present

## 2020-03-19 DIAGNOSIS — I110 Hypertensive heart disease with heart failure: Secondary | ICD-10-CM

## 2020-03-19 NOTE — Progress Notes (Signed)
Cardiology Office Note:    Date:  03/19/2020   ID:  Jacob [name], DOB [birth year], MRN [id number]  PCP:  Verda Cumins, MD  Cardiologist:  Shirlee More, MD    Referring MD: Verda Cumins, MD    ASSESSMENT:    1. Paroxysmal atrial fibrillation (HCC)   2. Chronic anticoagulation   3. Hypertensive heart disease with chronic diastolic congestive heart failure (HCC)   4. Stage 4 chronic kidney disease ([place])   5. Coronary artery disease of native artery of native heart with stable angina pectoris ([place])    PLAN:    In order of problems listed above:  1. Stable maintaining sinus rhythm off amiodarone he will continue beta-blocker and anticoagulant 2. Stable continue current antihypertensives BP is now in range as well as his current loop diuretic is followed by nephrology for CKD 3. Stable CAD continue medical therapy not on lipid-lowering treatment awaiting further evaluation for neuromuscular disease   Next appointment: 6 months   Medication Adjustments/Labs and Tests Ordered: Current medicines are reviewed at length with the patient today.  Concerns regarding medicines are outlined above.  Orders Placed This Encounter  Procedures  . EKG 12-Lead   No orders of the defined types were placed in this encounter.   Chief Complaint  Patient presents with  . Follow-up  . Atrial Fibrillation    History of Present Illness:    Jacob [name] is a 80 y.o. male with a hx of CAD with remote bypass surgery in 1997 atrial fibrillation paroxysmal treated with amiodarone and chronic anticoagulation right bundle branch block hypertensive heart disease with heart failure and chronic kidney disease COPD and obstructive sleep apnea.  In March she is amiodarone was discontinued at the recommendation of her neurologist at the [facility] hospital feel he was having a statin induced myopathy unimproved after stopping the statin.  When seen in the office last visit 12/13/2019 he was in sinus rhythm  first-degree AV block and right bundle branch block.  He was last seen 12/13/2019 Compliance with diet, lifestyle and medications: Yes  In the interim is been seen by neurology [facility] who thinks he may have myasthenia gravis he has follow-up testing scheduled.  His daughter wants to be sure I communicated placed Dr. Sallee Provencal and his care team.  His blood pressure is quite elevated his dose of Apresoline was increased he has been followed with nephrology.  He is having no palpitations syncope chest pain shortness of breath Past Medical History:  Diagnosis Date  . Acute encephalopathy 08/29/2015  . Acute on chronic congestive heart failure ([place])   . Acute renal failure superimposed on stage 4 chronic kidney disease ([place]) 10/02/2015  . Adjustment disorder with mixed emotional features 09/01/2015  . AKI (acute kidney injury) ([place])   . Allergy   . Anemia   . Anxiety   . Arthritis   . Asthma   . BPH (benign prostatic hyperplasia)   . CAD (coronary artery disease) 12/19/2011  . Cancer of kidney ([place])   . Cataract   . CHF (congestive heart failure) ([place])   . Chronic anticoagulation 08/22/2018  . Chronic diastolic heart failure ([place]) 11/27/2015  . Chronic kidney disease    chronic  kidney failure  kidney function at 42%  . CKD (chronic kidney disease) 11/27/2015  . COPD (chronic obstructive pulmonary disease) ([place])   . Coronary artery disease    CABG  7 bypasses  . Dehydration   . Diabetes mellitus without  complication ([place])   . Elevated troponin 08/29/2015  . Essential hypertension 07/12/2015  . Facial cellulitis   . GERD (gastroesophageal reflux disease)   . Gout   . Hepatitis    many years ago  . High risk medication use 08/22/2018  . History of CHF (congestive heart failure)   . HNP (herniated nucleus pulposus), lumbar 07/24/2015  . Hyperlipidemia   . Hypertension   . Hypertensive heart disease 04/24/2018  . Hypokalemia   . Hypotension 10/02/2015  . Lumbar disc disease   . Lumbar  radiculopathy 05/05/2018  . Neuropathy ([place]) 12/19/2011  . Normocytic anemia 11/27/2015  . Obesity   . On amiodarone therapy 08/22/2018  . OSA (obstructive sleep apnea) 07/27/2015  . Paroxysmal atrial fibrillation (HCC)   . Peripheral neuropathy   . PMR (polymyalgia rheumatica) ([place]) 09/09/2013  . Polymyalgia rheumatica (HCC)    maintained on Prednisone, Plaquenil. Followed by rhuematology every 4 months/[name].  . Renal cell carcinoma ([place]) 07/23/2015  . Renal mass 07/12/2015  . Rheumatoid arthritis ([place]) 04/13/2012  . Shortness of breath dyspnea    with exertion  . Sleep apnea    CPAP   Trying to use  . Spinal stenosis of lumbar region with radiculopathy 08/07/2015  . Type 2 diabetes mellitus with other specified complication ([place]) 28/05/1515    Past Surgical History:  Procedure Laterality Date  . APPENDECTOMY    . BIOPSY  01/23/2019   Procedure: BIOPSY;  Surgeon: Otis Brace, MD;  Location: [facility];  Service: Gastroenterology;;  . [place] 2000   left  . CATARACT EXTRACTION W/PHACO Right 11/28/2014   Procedure: CATARACT EXTRACTION PHACO AND INTRAOCULAR LENS PLACEMENT ([place]) RIGHT ;  Surgeon: Marylynn Pearson, MD;  Location: [place];  Service: Ophthalmology;  Laterality: Right;  . CORONARY ARTERY BYPASS GRAFT  03/17/1995   Wynonia Lawman; followed every six months.  . ESOPHAGOGASTRODUODENOSCOPY (EGD) WITH PROPOFOL Left 01/23/2019   Procedure: ESOPHAGOGASTRODUODENOSCOPY (EGD) WITH PROPOFOL;  Surgeon: Otis Brace, MD;  Location: [facility];  Service: Gastroenterology;  Laterality: Left;  . HERNIA REPAIR    . LUMBAR LAMINECTOMY/DECOMPRESSION MICRODISCECTOMY N/A 07/30/2015   Procedure: LUMBAR LAMINECTOMY DISCECTOMY ;  Surgeon: Ashok Pall, MD;  Location: [facility];  Service: Neurosurgery;  Laterality: N/A;  LUMBAR LAMINECTOMY DISCECTOMY   . LUMBAR LAMINECTOMY/DECOMPRESSION MICRODISCECTOMY N/A 09/06/2015   Procedure: Redo L3/4 Disectomy;  Surgeon: Ashok Pall, MD;   Location: [place];  Service: Neurosurgery;  Laterality: N/A;  . PROSTATE SURGERY     TURP at [place].  Marland Kitchen TRANSURETHRAL RESECTION OF PROSTATE      Current Medications: Current Meds  Medication Sig  . acetaminophen (TYLENOL) 500 MG tablet Take 1,000 mg by mouth every 6 (six) hours as needed.  Marland Kitchen amLODipine (NORVASC) 10 MG tablet TAKE 1 TABLET BY MOUTH EVERY DAY  . apixaban (ELIQUIS) 5 MG TABS tablet Take 5 mg by mouth 2 (two) times daily.  . Cholecalciferol 50 MCG (2000 UT) CAPS Take 2,000 Units by mouth daily.  . cyanocobalamin 1000 MCG tablet Take 1,000 mcg by mouth daily.  Marland Kitchen denosumab (PROLIA) 60 MG/ML SOSY injection Inject 60 mg into the skin every 6 (six) months.  . ferrous sulfate 325 (65 FE) MG tablet Take 325 mg by mouth as directed. MWF  . finasteride (PROSCAR) 5 MG tablet Take 5 mg by mouth daily.   . furosemide (LASIX) 40 MG tablet TAKE 1 TABLET BY MOUTH TWICE A DAY  . hydrALAZINE (APRESOLINE) 50 MG tablet Take 50 mg by  mouth 2 (two) times daily.  . insulin lispro protamine-lispro (HUMALOG 75/25 MIX) (75-25) 100 UNIT/ML SUSP injection Inject into the skin 2 (two) times daily with a meal. See sliding scale instructions.  Marland Kitchen latanoprost (XALATAN) 0.005 % ophthalmic solution Place 1 drop into both eyes at bedtime.  Marland Kitchen loratadine (CLARITIN) 10 MG tablet Take 10 mg by mouth daily.  . metoprolol tartrate (LOPRESSOR) 100 MG tablet Take 100 mg by mouth 2 (two) times daily.  . Multiple Vitamins-Minerals (PRESERVISION AREDS 2 PO) Take 1 tablet by mouth 2 (two) times daily.   . nitroGLYCERIN (NITROSTAT) 0.4 MG SL tablet Place 0.4 mg under the tongue every 5 (five) minutes as needed for chest pain.  . pilocarpine (PILOCAR) 4 % ophthalmic solution Place 1 drop into both eyes 2 (two) times daily.   . predniSONE (DELTASONE) 5 MG tablet Take 5 mg by mouth daily with breakfast.   . pregabalin (LYRICA) 25 MG capsule Take 25 mg by mouth daily.  Marland Kitchen SEMAGLUTIDE,0.25 OR 0.5MG /DOS, [place] Inject 1.5 mLs into  the skin once a week. 0.5mg /0.341ml  . Tamsulosin HCl (FLOMAX) 0.4 MG CAPS Take 0.8 mg by mouth at bedtime.      Allergies:   Ace inhibitors, Actonel [risedronate], Ciprocinonide [fluocinolone], Flunisolide, Metformin and related, Sertraline, Sulindac, Terazosin, and Oxycodone   Social History   Socioeconomic History  . Marital status: Married    Spouse name: Not on file  . Number of children: Not on file  . Years of education: Not on file  . Highest education level: Not on file  Occupational History  . Occupation: Geophysicist/field seismologist  Tobacco Use  . Smoking status: Former Smoker    Types: Cigarettes    Quit date: 03/16/1957    Years since quitting: 63.0  . Smokeless tobacco: Never Used  Vaping Use  . Vaping Use: Never used  Substance and Sexual Activity  . Alcohol use: No  . Drug use: No  . Sexual activity: Not Currently    Birth control/protection: None  Other Topics Concern  . Not on file  Social History Narrative   Marital status: Married x 55 years.      Children: 3 children; 5 grandchildren; 6 gg.      Lives: with wife.        Employment: retired; [employer].  Working every other week; 45 hours.      Tobacco:  Quit at age 53.      Alcohol:  None      Exercise: none      ADLs: indepedent with ADLs;  Uses cane and has a walker.  Drives.      Advanced Directives:  +Living Will.  DNR/DNI.  Wife is HCPOA.   Right hand   One story   Social Determinants of Health   Financial Resource Strain: Not on file  Food Insecurity: Not on file  Transportation Needs: Not on file  Physical Activity: Not on file  Stress: Not on file  Social Connections: Not on file     Family History: The patient's family history includes Diabetes in his brother; Hypertension in an other family member; Kidney failure in his brother. ROS:   Please see the history of present illness.    All other systems reviewed and are negative.  EKGs/Labs/Other Studies Reviewed:    The  following studies were reviewed today:  EKG:  EKG ordered today and personally reviewed.  The ekg ordered today demonstrates right bundle branch block sinus rhythm old inferior infarction  Recent Labs: No results found for requested labs within last 8760 hours.  Recent Lipid Panel    Component Value Date/Time   CHOL 260 (H) 01/23/2020 1125   TRIG 260 (H) 01/23/2020 1125   HDL 29 (L) 01/23/2020 1125   CHOLHDL 9.0 (H) 01/23/2020 1125   CHOLHDL 4.1 01/24/2014 1425   VLDL 23 01/24/2014 1425   LDLCALC 181 (H) 01/23/2020 1125    Physical Exam:    VS:  BP (!) 146/76   Pulse 60   Ht 5\' 11"  (1.803 m)   Wt 216 lb 0.6 oz (98 kg)   SpO2 97%   BMI 30.13 kg/m     Wt Readings from Last 3 Encounters:  03/19/20 216 lb 0.6 oz (98 kg)  12/13/19 222 lb (100.7 kg)  05/23/19 226 lb (102.5 kg)     GEN: He appears frail well nourished, well developed in no acute distress HEENT: Normal NECK: No JVD; No carotid bruits LYMPHATICS: No lymphadenopathy CARDIAC: RRR, no murmurs, rubs, gallops RESPIRATORY:  Clear to auscultation without rales, wheezing or rhonchi  ABDOMEN: Soft, non-tender, non-distended MUSCULOSKELETAL:  No edema; No deformity  SKIN: Warm and dry NEUROLOGIC:  Alert and oriented x 3 PSYCHIATRIC:  Normal affect    Signed, Shirlee More, MD  03/19/2020 1:41 PM    [facility]

## 2020-03-19 NOTE — Patient Instructions (Signed)

## 2020-03-21 ENCOUNTER — Encounter: Payer: HMO | Admitting: Physical Therapy

## 2020-03-26 ENCOUNTER — Ambulatory Visit: Payer: PPO | Admitting: Podiatry

## 2020-05-01 ENCOUNTER — Ambulatory Visit: Payer: PPO | Admitting: Podiatry

## 2020-05-03 ENCOUNTER — Telehealth: Payer: Self-pay | Admitting: *Deleted

## 2020-05-03 MED ORDER — AMLODIPINE BESYLATE 10 MG PO TABS: 10.0000 mg | ORAL_TABLET | Freq: Every day | ORAL | 3 refills | Status: AC

## 2020-05-03 NOTE — Telephone Encounter (Signed)
Rx refill sent to pharmacy. 

## 2020-05-14 DIAGNOSIS — N189 Chronic kidney disease, unspecified: Secondary | ICD-10-CM | POA: Diagnosis not present

## 2020-05-14 DIAGNOSIS — Z794 Long term (current) use of insulin: Secondary | ICD-10-CM | POA: Diagnosis not present

## 2020-05-14 DIAGNOSIS — E1140 Type 2 diabetes mellitus with diabetic neuropathy, unspecified: Secondary | ICD-10-CM | POA: Diagnosis not present

## 2020-05-14 DIAGNOSIS — J449 Chronic obstructive pulmonary disease, unspecified: Secondary | ICD-10-CM | POA: Diagnosis not present

## 2020-05-14 DIAGNOSIS — C649 Malignant neoplasm of unspecified kidney, except renal pelvis: Secondary | ICD-10-CM | POA: Diagnosis not present

## 2020-05-14 DIAGNOSIS — M4800 Spinal stenosis, site unspecified: Secondary | ICD-10-CM | POA: Diagnosis not present

## 2020-05-14 DIAGNOSIS — J690 Pneumonitis due to inhalation of food and vomit: Secondary | ICD-10-CM | POA: Diagnosis not present

## 2020-05-14 DIAGNOSIS — R5381 Other malaise: Secondary | ICD-10-CM | POA: Diagnosis not present

## 2020-05-15 ENCOUNTER — Ambulatory Visit: Payer: HMO

## 2020-05-15 DIAGNOSIS — F4322 Adjustment disorder with anxiety: Secondary | ICD-10-CM | POA: Diagnosis not present

## 2020-05-15 DIAGNOSIS — G4700 Insomnia, unspecified: Secondary | ICD-10-CM | POA: Diagnosis not present

## 2020-05-20 DIAGNOSIS — J690 Pneumonitis due to inhalation of food and vomit: Secondary | ICD-10-CM | POA: Diagnosis not present

## 2020-05-20 DIAGNOSIS — R5381 Other malaise: Secondary | ICD-10-CM | POA: Diagnosis not present

## 2020-05-20 DIAGNOSIS — L97419 Non-pressure chronic ulcer of right heel and midfoot with unspecified severity: Secondary | ICD-10-CM | POA: Diagnosis not present

## 2020-05-20 DIAGNOSIS — E1140 Type 2 diabetes mellitus with diabetic neuropathy, unspecified: Secondary | ICD-10-CM | POA: Diagnosis not present

## 2020-05-20 DIAGNOSIS — M4800 Spinal stenosis, site unspecified: Secondary | ICD-10-CM | POA: Diagnosis not present

## 2020-05-20 DIAGNOSIS — C649 Malignant neoplasm of unspecified kidney, except renal pelvis: Secondary | ICD-10-CM | POA: Diagnosis not present

## 2020-05-20 DIAGNOSIS — N189 Chronic kidney disease, unspecified: Secondary | ICD-10-CM | POA: Diagnosis not present

## 2020-05-20 DIAGNOSIS — L97422 Non-pressure chronic ulcer of left heel and midfoot with fat layer exposed: Secondary | ICD-10-CM | POA: Diagnosis not present

## 2020-05-20 DIAGNOSIS — Z794 Long term (current) use of insulin: Secondary | ICD-10-CM | POA: Diagnosis not present

## 2020-05-20 DIAGNOSIS — J449 Chronic obstructive pulmonary disease, unspecified: Secondary | ICD-10-CM | POA: Diagnosis not present

## 2020-05-22 DIAGNOSIS — F419 Anxiety disorder, unspecified: Secondary | ICD-10-CM | POA: Diagnosis not present

## 2020-05-22 DIAGNOSIS — G9341 Metabolic encephalopathy: Secondary | ICD-10-CM | POA: Diagnosis not present

## 2020-05-23 DIAGNOSIS — I10 Essential (primary) hypertension: Secondary | ICD-10-CM | POA: Diagnosis not present

## 2020-05-24 DIAGNOSIS — Z794 Long term (current) use of insulin: Secondary | ICD-10-CM | POA: Diagnosis not present

## 2020-05-24 DIAGNOSIS — J690 Pneumonitis due to inhalation of food and vomit: Secondary | ICD-10-CM | POA: Diagnosis not present

## 2020-05-24 DIAGNOSIS — R5381 Other malaise: Secondary | ICD-10-CM | POA: Diagnosis not present

## 2020-05-24 DIAGNOSIS — N189 Chronic kidney disease, unspecified: Secondary | ICD-10-CM | POA: Diagnosis not present

## 2020-05-24 DIAGNOSIS — J449 Chronic obstructive pulmonary disease, unspecified: Secondary | ICD-10-CM | POA: Diagnosis not present

## 2020-05-24 DIAGNOSIS — I10 Essential (primary) hypertension: Secondary | ICD-10-CM | POA: Diagnosis not present

## 2020-05-24 DIAGNOSIS — G9341 Metabolic encephalopathy: Secondary | ICD-10-CM | POA: Diagnosis not present

## 2020-05-24 DIAGNOSIS — F419 Anxiety disorder, unspecified: Secondary | ICD-10-CM | POA: Diagnosis not present

## 2020-05-27 DIAGNOSIS — L97419 Non-pressure chronic ulcer of right heel and midfoot with unspecified severity: Secondary | ICD-10-CM | POA: Diagnosis not present

## 2020-05-27 DIAGNOSIS — L97422 Non-pressure chronic ulcer of left heel and midfoot with fat layer exposed: Secondary | ICD-10-CM | POA: Diagnosis not present

## 2020-05-28 ENCOUNTER — Telehealth: Payer: Self-pay | Admitting: Cardiology

## 2020-05-28 NOTE — Telephone Encounter (Signed)
Mrs. Harmon notified of the following per Dr. Bettina Gavia and verbalized understanding.

## 2020-05-28 NOTE — Telephone Encounter (Signed)
If doing well and not having edema and continue the Lasix once a day  I looked at the records from [facility] and he is to take Eliquis twice daily

## 2020-05-28 NOTE — Telephone Encounter (Signed)
  Pt c/o medication issue:  1. Name of Medication:  furosemide (LASIX) 40 MG tablet apixaban (ELIQUIS) 5 MG TABS tablet  2. How are you currently taking this medication (dosage and times per day)? See below  3. Are you having a reaction (difficulty breathing--STAT)?  No  4. What is your medication issue? When patient was in [place] they changed his dosage on the above meds to once a day for both. Mrs Guia would like to know if he should continue taking the lower dosage or should he go back to taking it like Dr Bettina Gavia had him taking it before his hospitalization.

## 2020-05-28 NOTE — Telephone Encounter (Signed)
We will have him take it twice daily.  We are not managing his home care.

## 2020-05-29 ENCOUNTER — Inpatient Hospital Stay (HOSPITAL_COMMUNITY)
Admission: EM | Admit: 2020-05-29 | Discharge: 2020-06-14 | DRG: 291 | Disposition: E | Payer: No Typology Code available for payment source | Attending: Internal Medicine | Admitting: Internal Medicine

## 2020-05-29 ENCOUNTER — Telehealth: Payer: Self-pay | Admitting: Cardiology

## 2020-05-29 ENCOUNTER — Encounter (HOSPITAL_COMMUNITY): Payer: Self-pay

## 2020-05-29 ENCOUNTER — Other Ambulatory Visit: Payer: Self-pay

## 2020-05-29 ENCOUNTER — Emergency Department (HOSPITAL_COMMUNITY): Payer: No Typology Code available for payment source

## 2020-05-29 DIAGNOSIS — I5033 Acute on chronic diastolic (congestive) heart failure: Secondary | ICD-10-CM | POA: Diagnosis present

## 2020-05-29 DIAGNOSIS — G8929 Other chronic pain: Secondary | ICD-10-CM | POA: Diagnosis present

## 2020-05-29 DIAGNOSIS — E1122 Type 2 diabetes mellitus with diabetic chronic kidney disease: Secondary | ICD-10-CM | POA: Diagnosis present

## 2020-05-29 DIAGNOSIS — A419 Sepsis, unspecified organism: Secondary | ICD-10-CM | POA: Diagnosis not present

## 2020-05-29 DIAGNOSIS — M549 Dorsalgia, unspecified: Secondary | ICD-10-CM | POA: Diagnosis present

## 2020-05-29 DIAGNOSIS — R0602 Shortness of breath: Secondary | ICD-10-CM | POA: Diagnosis not present

## 2020-05-29 DIAGNOSIS — Z85528 Personal history of other malignant neoplasm of kidney: Secondary | ICD-10-CM

## 2020-05-29 DIAGNOSIS — S91301A Unspecified open wound, right foot, initial encounter: Secondary | ICD-10-CM | POA: Diagnosis present

## 2020-05-29 DIAGNOSIS — J440 Chronic obstructive pulmonary disease with acute lower respiratory infection: Secondary | ICD-10-CM | POA: Diagnosis present

## 2020-05-29 DIAGNOSIS — Z888 Allergy status to other drugs, medicaments and biological substances status: Secondary | ICD-10-CM

## 2020-05-29 DIAGNOSIS — G4733 Obstructive sleep apnea (adult) (pediatric): Secondary | ICD-10-CM | POA: Diagnosis present

## 2020-05-29 DIAGNOSIS — I130 Hypertensive heart and chronic kidney disease with heart failure and stage 1 through stage 4 chronic kidney disease, or unspecified chronic kidney disease: Secondary | ICD-10-CM | POA: Diagnosis not present

## 2020-05-29 DIAGNOSIS — G9341 Metabolic encephalopathy: Secondary | ICD-10-CM | POA: Diagnosis present

## 2020-05-29 DIAGNOSIS — I480 Paroxysmal atrial fibrillation: Secondary | ICD-10-CM | POA: Diagnosis present

## 2020-05-29 DIAGNOSIS — R778 Other specified abnormalities of plasma proteins: Secondary | ICD-10-CM | POA: Diagnosis present

## 2020-05-29 DIAGNOSIS — E669 Obesity, unspecified: Secondary | ICD-10-CM | POA: Diagnosis present

## 2020-05-29 DIAGNOSIS — M48061 Spinal stenosis, lumbar region without neurogenic claudication: Secondary | ICD-10-CM | POA: Diagnosis present

## 2020-05-29 DIAGNOSIS — E1169 Type 2 diabetes mellitus with other specified complication: Secondary | ICD-10-CM | POA: Diagnosis present

## 2020-05-29 DIAGNOSIS — I2510 Atherosclerotic heart disease of native coronary artery without angina pectoris: Secondary | ICD-10-CM | POA: Diagnosis present

## 2020-05-29 DIAGNOSIS — N400 Enlarged prostate without lower urinary tract symptoms: Secondary | ICD-10-CM | POA: Diagnosis present

## 2020-05-29 DIAGNOSIS — S91309A Unspecified open wound, unspecified foot, initial encounter: Secondary | ICD-10-CM

## 2020-05-29 DIAGNOSIS — J811 Chronic pulmonary edema: Secondary | ICD-10-CM | POA: Diagnosis not present

## 2020-05-29 DIAGNOSIS — J449 Chronic obstructive pulmonary disease, unspecified: Secondary | ICD-10-CM | POA: Diagnosis not present

## 2020-05-29 DIAGNOSIS — Z881 Allergy status to other antibiotic agents status: Secondary | ICD-10-CM

## 2020-05-29 DIAGNOSIS — Z951 Presence of aortocoronary bypass graft: Secondary | ICD-10-CM

## 2020-05-29 DIAGNOSIS — E1142 Type 2 diabetes mellitus with diabetic polyneuropathy: Secondary | ICD-10-CM | POA: Diagnosis not present

## 2020-05-29 DIAGNOSIS — N184 Chronic kidney disease, stage 4 (severe): Secondary | ICD-10-CM | POA: Diagnosis not present

## 2020-05-29 DIAGNOSIS — Z841 Family history of disorders of kidney and ureter: Secondary | ICD-10-CM

## 2020-05-29 DIAGNOSIS — J690 Pneumonitis due to inhalation of food and vomit: Secondary | ICD-10-CM | POA: Diagnosis present

## 2020-05-29 DIAGNOSIS — Z8249 Family history of ischemic heart disease and other diseases of the circulatory system: Secondary | ICD-10-CM

## 2020-05-29 DIAGNOSIS — E782 Mixed hyperlipidemia: Secondary | ICD-10-CM | POA: Diagnosis present

## 2020-05-29 DIAGNOSIS — Z7952 Long term (current) use of systemic steroids: Secondary | ICD-10-CM

## 2020-05-29 DIAGNOSIS — R9431 Abnormal electrocardiogram [ECG] [EKG]: Secondary | ICD-10-CM | POA: Diagnosis present

## 2020-05-29 DIAGNOSIS — L89622 Pressure ulcer of left heel, stage 2: Secondary | ICD-10-CM | POA: Diagnosis present

## 2020-05-29 DIAGNOSIS — S91302A Unspecified open wound, left foot, initial encounter: Secondary | ICD-10-CM | POA: Diagnosis present

## 2020-05-29 DIAGNOSIS — Z794 Long term (current) use of insulin: Secondary | ICD-10-CM

## 2020-05-29 DIAGNOSIS — M069 Rheumatoid arthritis, unspecified: Secondary | ICD-10-CM | POA: Diagnosis present

## 2020-05-29 DIAGNOSIS — I517 Cardiomegaly: Secondary | ICD-10-CM | POA: Diagnosis not present

## 2020-05-29 DIAGNOSIS — N390 Urinary tract infection, site not specified: Secondary | ICD-10-CM | POA: Diagnosis present

## 2020-05-29 DIAGNOSIS — J159 Unspecified bacterial pneumonia: Secondary | ICD-10-CM | POA: Diagnosis present

## 2020-05-29 DIAGNOSIS — Z833 Family history of diabetes mellitus: Secondary | ICD-10-CM

## 2020-05-29 DIAGNOSIS — Z79899 Other long term (current) drug therapy: Secondary | ICD-10-CM

## 2020-05-29 DIAGNOSIS — R0902 Hypoxemia: Secondary | ICD-10-CM | POA: Diagnosis present

## 2020-05-29 DIAGNOSIS — L89899 Pressure ulcer of other site, unspecified stage: Secondary | ICD-10-CM | POA: Diagnosis present

## 2020-05-29 DIAGNOSIS — Z7901 Long term (current) use of anticoagulants: Secondary | ICD-10-CM

## 2020-05-29 DIAGNOSIS — Z515 Encounter for palliative care: Secondary | ICD-10-CM

## 2020-05-29 DIAGNOSIS — I509 Heart failure, unspecified: Secondary | ICD-10-CM

## 2020-05-29 DIAGNOSIS — M353 Polymyalgia rheumatica: Secondary | ICD-10-CM | POA: Diagnosis present

## 2020-05-29 DIAGNOSIS — Z66 Do not resuscitate: Secondary | ICD-10-CM | POA: Diagnosis present

## 2020-05-29 DIAGNOSIS — Z87891 Personal history of nicotine dependence: Secondary | ICD-10-CM

## 2020-05-29 DIAGNOSIS — J189 Pneumonia, unspecified organism: Secondary | ICD-10-CM | POA: Diagnosis not present

## 2020-05-29 DIAGNOSIS — Z20822 Contact with and (suspected) exposure to covid-19: Secondary | ICD-10-CM | POA: Diagnosis present

## 2020-05-29 DIAGNOSIS — Z6827 Body mass index (BMI) 27.0-27.9, adult: Secondary | ICD-10-CM

## 2020-05-29 DIAGNOSIS — Z885 Allergy status to narcotic agent status: Secondary | ICD-10-CM

## 2020-05-29 DIAGNOSIS — F32A Depression, unspecified: Secondary | ICD-10-CM | POA: Diagnosis present

## 2020-05-29 DIAGNOSIS — I4581 Long QT syndrome: Secondary | ICD-10-CM | POA: Diagnosis present

## 2020-05-29 DIAGNOSIS — J90 Pleural effusion, not elsewhere classified: Secondary | ICD-10-CM | POA: Diagnosis not present

## 2020-05-29 LAB — URINALYSIS, ROUTINE W REFLEX MICROSCOPIC
Bilirubin Urine: NEGATIVE
Glucose, UA: NEGATIVE mg/dL
Ketones, ur: NEGATIVE mg/dL
Nitrite: POSITIVE — AB
Protein, ur: >300 mg/dL — AB
Specific Gravity, Urine: 1.025 (ref 1.005–1.030)
pH: 6 (ref 5.0–8.0)

## 2020-05-29 LAB — COMPREHENSIVE METABOLIC PANEL
ALT: 15 U/L (ref 0–44)
AST: 33 U/L (ref 15–41)
Albumin: 3.8 g/dL (ref 3.5–5.0)
Alkaline Phosphatase: 115 U/L (ref 38–126)
Anion gap: 12 (ref 5–15)
BUN: 32 mg/dL — ABNORMAL HIGH (ref 8–23)
CO2: 22 mmol/L (ref 22–32)
Calcium: 8.4 mg/dL — ABNORMAL LOW (ref 8.9–10.3)
Chloride: 109 mmol/L (ref 98–111)
Creatinine, Ser: 2.47 mg/dL — ABNORMAL HIGH (ref 0.61–1.24)
GFR, Estimated: 26 mL/min — ABNORMAL LOW (ref >60)
Glucose, Bld: 164 mg/dL — ABNORMAL HIGH (ref 70–99)
Potassium: 4.7 mmol/L (ref 3.5–5.1)
Sodium: 143 mmol/L (ref 135–145)
Total Bilirubin: 1.8 mg/dL — ABNORMAL HIGH (ref 0.3–1.2)
Total Protein: 7.7 g/dL (ref 6.5–8.1)

## 2020-05-29 LAB — CBG MONITORING, ED: Glucose-Capillary: 126 mg/dL — ABNORMAL HIGH (ref 70–99)

## 2020-05-29 LAB — CBC WITH DIFFERENTIAL/PLATELET
Abs Immature Granulocytes: 0.02 10*3/uL (ref 0.00–0.07)
Basophils Absolute: 0 10*3/uL (ref 0.0–0.1)
Basophils Relative: 1 %
Eosinophils Absolute: 0 10*3/uL (ref 0.0–0.5)
Eosinophils Relative: 0 %
HCT: 37 % — ABNORMAL LOW (ref 39.0–52.0)
Hemoglobin: 11.1 g/dL — ABNORMAL LOW (ref 13.0–17.0)
Immature Granulocytes: 0 %
Lymphocytes Relative: 14 %
Lymphs Abs: 0.9 10*3/uL (ref 0.7–4.0)
MCH: 31.4 pg (ref 26.0–34.0)
MCHC: 30 g/dL (ref 30.0–36.0)
MCV: 104.5 fL — ABNORMAL HIGH (ref 80.0–100.0)
Monocytes Absolute: 0.4 10*3/uL (ref 0.1–1.0)
Monocytes Relative: 6 %
Neutro Abs: 5.2 10*3/uL (ref 1.7–7.7)
Neutrophils Relative %: 79 %
Platelets: 181 10*3/uL (ref 150–400)
RBC: 3.54 MIL/uL — ABNORMAL LOW (ref 4.22–5.81)
RDW: 17.4 % — ABNORMAL HIGH (ref 11.5–15.5)
WBC: 6.5 10*3/uL (ref 4.0–10.5)
nRBC: 0 % (ref 0.0–0.2)

## 2020-05-29 LAB — URINALYSIS, MICROSCOPIC (REFLEX)

## 2020-05-29 LAB — BLOOD GAS, VENOUS
Acid-base deficit: 1.5 mmol/L (ref 0.0–2.0)
Bicarbonate: 24.3 mmol/L (ref 20.0–28.0)
O2 Saturation: 47.9 %
Patient temperature: 98.6
pCO2, Ven: 48.8 mmHg (ref 44.0–60.0)
pH, Ven: 7.318 (ref 7.250–7.430)
pO2, Ven: <31 mmHg — CL (ref 32.0–45.0)

## 2020-05-29 LAB — SARS CORONAVIRUS 2 (TAT 6-24 HRS): SARS Coronavirus 2: NEGATIVE

## 2020-05-29 LAB — BRAIN NATRIURETIC PEPTIDE: B Natriuretic Peptide: 4485.9 pg/mL — ABNORMAL HIGH (ref 0.0–100.0)

## 2020-05-29 LAB — TROPONIN I (HIGH SENSITIVITY): Troponin I (High Sensitivity): 75 ng/L — ABNORMAL HIGH (ref <18)

## 2020-05-29 MED ORDER — SODIUM CHLORIDE 0.9 % IV SOLN
1.0000 g | Freq: Once | INTRAVENOUS | Status: AC
  Administered 2020-05-29: 1 g via INTRAVENOUS
  Filled 2020-05-29: qty 10

## 2020-05-29 MED ORDER — FUROSEMIDE 10 MG/ML IJ SOLN
60.0000 mg | Freq: Once | INTRAMUSCULAR | Status: AC
  Administered 2020-05-29: 60 mg via INTRAVENOUS
  Filled 2020-05-29: qty 8

## 2020-05-29 MED ORDER — ONDANSETRON HCL 4 MG/2ML IJ SOLN
4.0000 mg | Freq: Once | INTRAMUSCULAR | Status: AC
  Administered 2020-05-29: 4 mg via INTRAVENOUS
  Filled 2020-05-29: qty 2

## 2020-05-29 MED ORDER — INSULIN ASPART 100 UNIT/ML ~~LOC~~ SOLN
0.0000 [IU] | Freq: Three times a day (TID) | SUBCUTANEOUS | Status: DC
  Administered 2020-05-29 – 2020-05-31 (×5): 2 [IU] via SUBCUTANEOUS
  Administered 2020-05-31: 5 [IU] via SUBCUTANEOUS
  Filled 2020-05-29: qty 0.15

## 2020-05-29 NOTE — ED Triage Notes (Addendum)
Pt arrived via walk in, was recent discharged from [facility] about a week ago for kidney infection and failure. Was sent to rehab after d/c. States swelling in bilateral feet the last couple days, swelling worsening today, with worsening SOB. Hx of CHF, lasix dose doubled by cardiologist yesterday.

## 2020-05-29 NOTE — ED Notes (Signed)
ED TO INPATIENT HANDOFF REPORT  Name/Age/Gender Jacob [name] 80 y.o. male  Code Status Code Status History    Date Active Date Inactive Code Status Order ID Comments User Context   01/19/2019 1815 01/24/2019 1851 Full Code [id number]  Georgette Shell, MD ED   11/01/2018 2009 11/04/2018 1735 Full Code [id number]  Shirley, [place], DO ED   11/28/2015 0051 12/04/2015 0034 Partial Code [id number]  Vianne Bulls, MD ED   10/02/2015 0341 10/14/2015 1825 Partial Code [id number]  Norval Morton, MD ED   10/02/2015 0229 10/02/2015 0341 Full Code [id number]  Norval Morton, MD ED   08/29/2015 0021 09/09/2015 1958 Full Code [id number]  Toy Baker, MD Inpatient   07/24/2015 2327 08/09/2015 1822 Full Code [id number]  Ashok Pall, MD Inpatient   Advance Care Planning Activity    Questions for Most Recent Historical Code Status (Order [id number])         Advance Directive Documentation   Flowsheet Row Most Recent Value  Type of Advance Directive Out of facility DNR (pink MOST or yellow form), Healthcare Power of Attorney, Living will  [daughter states that pt has dnr - notified Sayuri Rhames RN that she said this - no yellow form in room]  Pre-existing out of facility DNR order (yellow form or pink MOST form) --  "MOST" Form in Place? --      Home/SNF/Other Home  Chief Complaint Acute congestive heart failure ([place]) [I50.9]  Level of Care/Admitting Diagnosis ED Disposition    ED Disposition Condition Real: [place] [100102]  Level of Care: Telemetry [5]  Admit to tele based on following criteria: Acute CHF  Admit to tele based on following criteria: Monitor for Ischemic changes  Covid Evaluation: Asymptomatic Screening Protocol (No Symptoms)  Diagnosis: Acute congestive heart failure [facility]) [[id number]]  Admitting Physician: Vernelle Emerald [[id number]]  Attending Physician: Vernelle Emerald [[id number]]       Medical History Past Medical  History:  Diagnosis Date  . Acute encephalopathy 08/29/2015  . Acute on chronic congestive heart failure ([place])   . Acute renal failure superimposed on stage 4 chronic kidney disease ([place]) 10/02/2015  . Adjustment disorder with mixed emotional features 09/01/2015  . AKI (acute kidney injury) ([place])   . Allergy   . Anemia   . Anxiety   . Arthritis   . Asthma   . BPH (benign prostatic hyperplasia)   . CAD (coronary artery disease) 12/19/2011  . Cancer of kidney ([place])   . Cataract   . CHF (congestive heart failure) ([place])   . Chronic anticoagulation 08/22/2018  . Chronic diastolic heart failure ([place]) 11/27/2015  . Chronic kidney disease    chronic  kidney failure  kidney function at 42%  . CKD (chronic kidney disease) 11/27/2015  . COPD (chronic obstructive pulmonary disease) ([place])   . Coronary artery disease    CABG  7 bypasses  . Dehydration   . Diabetes mellitus without complication ([place])   . Elevated troponin 08/29/2015  . Essential hypertension 07/12/2015  . Facial cellulitis   . GERD (gastroesophageal reflux disease)   . Gout   . Hepatitis    many years ago  . High risk medication use 08/22/2018  . History of CHF (congestive heart failure)   . HNP (herniated nucleus pulposus), lumbar 07/24/2015  . Hyperlipidemia   . Hypertension   . Hypertensive heart disease 04/24/2018  . Hypokalemia   . Hypotension 10/02/2015  . Lumbar disc  disease   . Lumbar radiculopathy 05/05/2018  . Neuropathy ([place]) 12/19/2011  . Normocytic anemia 11/27/2015  . Obesity   . On amiodarone therapy 08/22/2018  . OSA (obstructive sleep apnea) 07/27/2015  . Paroxysmal atrial fibrillation (HCC)   . Peripheral neuropathy   . PMR (polymyalgia rheumatica) ([place]) 09/09/2013  . Polymyalgia rheumatica (HCC)    maintained on Prednisone, Plaquenil. Followed by rhuematology every 4 months/[name].  . Renal cell carcinoma ([place]) 07/23/2015  . Renal mass 07/12/2015  . Rheumatoid arthritis ([place]) 04/13/2012  . Shortness of breath dyspnea     with exertion  . Sleep apnea    CPAP   Trying to use  . Spinal stenosis of lumbar region with radiculopathy 08/07/2015  . Type 2 diabetes mellitus with other specified complication ([place]) 37/10/5883    Allergies Allergies  Allergen Reactions  . Ace Inhibitors Other (See Comments)    Probably nausea and vomiting per patient   . Actonel [Risedronate] Nausea And Vomiting  . Ciprocinonide [Fluocinolone] Other (See Comments)    Probably nausea and vomiting per patient  . Flunisolide Other (See Comments)    Probably nausea and vomiting per patient   . Gabapentin     Other reaction(s): Coordination problem, Other (See Comments) tremors   . Metformin And Related Other (See Comments)    Probably nausea and vomiting per patient   . Sertraline Other (See Comments)    Probably nausea and vomiting per patient   . Sulindac Other (See Comments)    Probably nausea and vomiting per patient   . Terazosin Other (See Comments)    Probably nausea and vomiting per patient   . Ciprofloxacin Nausea And Vomiting    Other reaction(s): Edema of oral soft tissues, Irritation of penis, Edema of oral soft tissues, Irritation of penis, GI Upset (intolerance)  . Oxycodone Nausea And Vomiting    IV Location/Drains/Wounds Patient Lines/Drains/Airways Status    Active Line/Drains/Airways    Name Placement date Placement time Site Days   Peripheral IV 06/06/2020 Right;Lateral Forearm 06/01/2020  2115  Forearm  less than 1   Urethral Catheter D.Dark,RN [place] 18 Fr. 01/20/19  1110  Coude  495   Incision (Closed) 09/06/15 Back Other (Comment) 09/06/15  2016  -- 1727   Wound / Incision (Open or Dehisced) 08/30/15   Lower 08/30/15  1503  --  1734          Labs/Imaging Results for orders placed or performed during the hospital encounter of 05/25/2020 (from the past 48 hour(s))  Comprehensive metabolic panel     Status: Abnormal   Collection Time: 05/27/2020  3:00 PM  Result Value Ref Range   Sodium 143 135  - 145 mmol/L   Potassium 4.7 3.5 - 5.1 mmol/L   Chloride 109 98 - 111 mmol/L   CO2 22 22 - 32 mmol/L   Glucose, Bld 164 (H) 70 - 99 mg/dL    Comment: Glucose reference range applies only to samples taken after fasting for at least 8 hours.   BUN 32 (H) 8 - 23 mg/dL   Creatinine, Ser 2.47 (H) 0.61 - 1.24 mg/dL   Calcium 8.4 (L) 8.9 - 10.3 mg/dL   Total Protein 7.7 6.5 - 8.1 g/dL   Albumin 3.8 3.5 - 5.0 g/dL   AST 33 15 - 41 U/L   ALT 15 0 - 44 U/L   Alkaline Phosphatase 115 38 - 126 U/L   Total Bilirubin 1.8 (H) 0.3 - 1.2 mg/dL   GFR,  Estimated 26 (L) >60 mL/min    Comment: (NOTE) Calculated using the CKD-EPI Creatinine Equation (2021)    Anion gap 12 5 - 15    Comment: Performed at [facility], [place] [address] [place], [place] [address]  Brain natriuretic peptide     Status: Abnormal   Collection Time: 05/27/2020  3:00 PM  Result Value Ref Range   B Natriuretic Peptide 4,485.9 (H) 0.0 - 100.0 pg/mL    Comment: Performed at [facility], [place] [address] [place], [place] [address]  CBC with Differential     Status: Abnormal   Collection Time: 06/03/2020  3:00 PM  Result Value Ref Range   WBC 6.5 4.0 - 10.5 K/uL   RBC 3.54 (L) 4.22 - 5.81 MIL/uL   Hemoglobin 11.1 (L) 13.0 - 17.0 g/dL   HCT 37.0 (L) 39.0 - 52.0 %   MCV 104.5 (H) 80.0 - 100.0 fL   MCH 31.4 26.0 - 34.0 pg   MCHC 30.0 30.0 - 36.0 g/dL   RDW 17.4 (H) 11.5 - 15.5 %   Platelets 181 150 - 400 K/uL   nRBC 0.0 0.0 - 0.2 %   Neutrophils Relative % 79 %   Neutro Abs 5.2 1.7 - 7.7 K/uL   Lymphocytes Relative 14 %   Lymphs Abs 0.9 0.7 - 4.0 K/uL   Monocytes Relative 6 %   Monocytes Absolute 0.4 0.1 - 1.0 K/uL   Eosinophils Relative 0 %   Eosinophils Absolute 0.0 0.0 - 0.5 K/uL   Basophils Relative 1 %   Basophils Absolute 0.0 0.0 - 0.1 K/uL   Immature Granulocytes 0 %   Abs Immature Granulocytes 0.02 0.00 - 0.07 K/uL    Comment: Performed at [facility], [place]  [address], [place], [place] [address]  Blood gas, venous     Status: Abnormal   Collection Time: 05/19/2020  3:00 PM  Result Value Ref Range   pH, Ven 7.318 7.250 - 7.430   pCO2, Ven 48.8 44.0 - 60.0 mmHg   pO2, Ven <31.0 (LL) 32.0 - 45.0 mmHg    Comment: CRITICAL RESULT CALLED TO, READ BACK BY AND VERIFIED WITH: S.WEST, RN AT 1527 ON 03.16.22 BY N.THOMPSON    Bicarbonate 24.3 20.0 - 28.0 mmol/L   Acid-base deficit 1.5 0.0 - 2.0 mmol/L   O2 Saturation 47.9 %   Patient temperature 98.6     Comment: Performed at [facility], [place] [address] [place], [place] [address]  Urinalysis, Routine w reflex microscopic Urine, Clean Catch     Status: Abnormal   Collection Time: 05/26/2020  4:00 PM  Result Value Ref Range   Color, Urine YELLOW YELLOW   APPearance CLEAR CLEAR   Specific Gravity, Urine 1.025 1.005 - 1.030   pH 6.0 5.0 - 8.0   Glucose, UA NEGATIVE NEGATIVE mg/dL   Hgb urine dipstick SMALL (A) NEGATIVE   Bilirubin Urine NEGATIVE NEGATIVE   Ketones, ur NEGATIVE NEGATIVE mg/dL   Protein, ur >300 (A) NEGATIVE mg/dL   Nitrite POSITIVE (A) NEGATIVE   Leukocytes,Ua SMALL (A) NEGATIVE    Comment: Performed at [facility], [place] [address] [place], [place] [address]  Urinalysis, Microscopic (reflex)     Status: Abnormal   Collection Time: 06/01/2020  4:00 PM  Result Value Ref Range   RBC / HPF 0-5 0 - 5 RBC/hpf   WBC, UA 0-5 0 - 5 WBC/hpf   Bacteria, UA RARE (A) NONE  SEEN   Squamous Epithelial / LPF 0-5 0 - 5    Comment: Performed at [facility], [place] [address], [place], [place] [address]  CBG monitoring, ED     Status: Abnormal   Collection Time: 06/04/2020  9:20 PM  Result Value Ref Range   Glucose-Capillary 126 (H) 70 - 99 mg/dL    Comment: Glucose reference range applies only to samples taken after fasting for at least 8 hours.   DG Chest 2 View  Result Date: 05/16/2020 CLINICAL DATA:  Increasing shortness of breath over the  past few days. EXAM: CHEST - 2 VIEW COMPARISON:  Single-view of the chest 01/19/2019 in 11/21/2015. FINDINGS: There is cardiomegaly and interstitial pulmonary edema. Small to moderate pleural effusions and basilar airspace disease are much worse on the left. Aortic atherosclerosis is noted. No pneumothorax. No focal bony abnormality. IMPRESSION: Cardiomegaly and pulmonary edema. Small to moderate pleural effusions and basilar airspace disease, worse on the left. Airspace disease is likely due to atelectasis but could reflect pneumonia. Electronically Signed   By: Inge Rise M.D.   On: 05/27/2020 14:16    Pending Labs Unresulted Labs (From admission, onward)          Start     Ordered   05/22/2020 2034  Hemoglobin A1c  Add-on,   AD       Comments: To assess prior glycemic control    05/25/2020 2033   05/27/2020 1908  Urine culture  ONCE - STAT,   STAT        06/08/2020 1907   05/16/2020 1411  SARS CORONAVIRUS 2 (TAT 6-24 HRS) Nasopharyngeal Nasopharyngeal Swab  (Tier 3 - Symptomatic/asymptomatic with Precautions)  Once,   STAT       Question Answer Comment  Is this test for diagnosis or screening Screening   Symptomatic for COVID-19 as defined by CDC No   Hospitalized for COVID-19 No   Admitted to ICU for COVID-19 No   Previously tested for COVID-19 Yes   Resident in a congregate (group) care setting No   Employed in healthcare setting No   Has patient completed COVID vaccination(s) (2 doses of Pfizer/Moderna 1 dose of [employer]) Unknown      06/12/2020 1410          Vitals/Pain Today's Vitals   05/14/2020 1912 05/21/2020 1930 06/12/2020 2000 06/13/2020 2100  BP:  (!) 162/97 (!) 157/102 (!) 154/93  Pulse:  73 71 78  Resp:  16 16 16   Temp: 98 F (36.7 C)   98 F (36.7 C)  TempSrc: Oral     SpO2:  98% 98% 91%    Isolation Precautions Airborne and Contact precautions  Medications Medications  cefTRIAXone (ROCEPHIN) 1 g in sodium chloride 0.9 % 100 mL IVPB (1 g Intravenous New  Bag/Given 06/03/2020 2117)  insulin aspart (novoLOG) injection 0-15 Units (2 Units Subcutaneous Given 06/04/2020 2122)  furosemide (LASIX) injection 60 mg (60 mg Intravenous Given 06/06/2020 1507)  ondansetron (ZOFRAN) injection 4 mg (4 mg Intravenous Given 05/16/2020 1607)    Mobility walks with device

## 2020-05-29 NOTE — Telephone Encounter (Signed)
Left another message to rerun my call.

## 2020-05-29 NOTE — ED Notes (Signed)
Wounds changed on bilateral feet.

## 2020-05-29 NOTE — ED Provider Notes (Addendum)
[facility] DEPT Provider Note   CSN: [id number] Arrival date & time: 06/06/2020  1324     History Chief Complaint  Patient presents with  . Shortness of Breath    Jacob [name] is a 80 y.o. male.  HPI Patient presenting for evaluation of shortness of breath and leg swelling, gradually worse over the last 2 days since discharge from rehab, 2 days ago.  He states he is sitting with his feet dangling and his head forward because he gets more trouble breathing if he lays back, or puts his feet up.  He has ongoing mild shortness of breath.  He was recently restarted on bronchodilators and long-acting inhaled steroid, following recent hospitalization.  He denies fever, chills, nausea, vomiting, focal weakness or paresthesia.  He has wounds of his feet, bilaterally, which started as blisters and were last treated on the day of discharge from rehab, 2 days ago with therapeutic wraps.   He was hospitalized for 2 and half weeks and discharged on 05/13/2020: At that time was treated for hypoxic hypercapnic respiratory failure and aspiration pneumonia, AKI, and toxic metabolic encephalopathy.  He has chronic back pain requiring multiple medications.  Patient was noted to have difficult to control blood sugar during hospitalization.   There are no other known active modifying factors.  Past Medical History:  Diagnosis Date  . Acute encephalopathy 08/29/2015  . Acute on chronic congestive heart failure ([place])   . Acute renal failure superimposed on stage 4 chronic kidney disease ([place]) 10/02/2015  . Adjustment disorder with mixed emotional features 09/01/2015  . AKI (acute kidney injury) ([place])   . Allergy   . Anemia   . Anxiety   . Arthritis   . Asthma   . BPH (benign prostatic hyperplasia)   . CAD (coronary artery disease) 12/19/2011  . Cancer of kidney ([place])   . Cataract   . CHF (congestive heart failure) ([place])   . Chronic anticoagulation 08/22/2018  . Chronic  diastolic heart failure ([place]) 11/27/2015  . Chronic kidney disease    chronic  kidney failure  kidney function at 42%  . CKD (chronic kidney disease) 11/27/2015  . COPD (chronic obstructive pulmonary disease) ([place])   . Coronary artery disease    CABG  7 bypasses  . Dehydration   . Diabetes mellitus without complication ([place])   . Elevated troponin 08/29/2015  . Essential hypertension 07/12/2015  . Facial cellulitis   . GERD (gastroesophageal reflux disease)   . Gout   . Hepatitis    many years ago  . High risk medication use 08/22/2018  . History of CHF (congestive heart failure)   . HNP (herniated nucleus pulposus), lumbar 07/24/2015  . Hyperlipidemia   . Hypertension   . Hypertensive heart disease 04/24/2018  . Hypokalemia   . Hypotension 10/02/2015  . Lumbar disc disease   . Lumbar radiculopathy 05/05/2018  . Neuropathy ([place]) 12/19/2011  . Normocytic anemia 11/27/2015  . Obesity   . On amiodarone therapy 08/22/2018  . OSA (obstructive sleep apnea) 07/27/2015  . Paroxysmal atrial fibrillation (HCC)   . Peripheral neuropathy   . PMR (polymyalgia rheumatica) ([place]) 09/09/2013  . Polymyalgia rheumatica (HCC)    maintained on Prednisone, Plaquenil. Followed by rhuematology every 4 months/[name].  . Renal cell carcinoma ([place]) 07/23/2015  . Renal mass 07/12/2015  . Rheumatoid arthritis ([place]) 04/13/2012  . Shortness of breath dyspnea    with exertion  . Sleep apnea    CPAP   Trying  to use  . Spinal stenosis of lumbar region with radiculopathy 08/07/2015  . Type 2 diabetes mellitus with other specified complication ([place]) 76/03/9507    Patient Active Problem List   Diagnosis Date Noted  . Dehydration   . Sleep apnea   . Polymyalgia rheumatica ([place])   . Peripheral neuropathy   . Obesity   . Lumbar disc disease   . Hypertension   . Hyperlipidemia   . Hepatitis   . Gout   . Diabetes mellitus without complication ([place])   . Coronary artery disease   . Chronic kidney disease   . CHF  (congestive heart failure) ([place])   . Cataract   . Cancer of kidney ([place])   . BPH (benign prostatic hyperplasia)   . Asthma   . Arthritis   . Anxiety   . Anemia   . Allergy   . Other chronic pain   . Intractable nausea and vomiting 01/19/2019  . Intractable vomiting   . Hypokalemia   . GERD (gastroesophageal reflux disease) 11/07/2018  . History of CHF (congestive heart failure)   . Facial cellulitis   . Acute on chronic congestive heart failure ([place])   . Gait abnormality 11/02/2018  . Cellulitis 11/01/2018  . Paroxysmal atrial fibrillation ([place]) 08/22/2018  . High risk medication use 08/22/2018  . Chronic anticoagulation 08/22/2018  . On amiodarone therapy 08/22/2018  . Lumbar radiculopathy 05/05/2018  . Hypertensive heart disease 04/24/2018  . CKD (chronic kidney disease) 11/27/2015  . Chronic diastolic heart failure ([place]) 11/27/2015  . Normocytic anemia 11/27/2015  . AKI (acute kidney injury) ([place])   . Acute renal failure superimposed on stage 4 chronic kidney disease ([place]) 10/02/2015  . Hypotension 10/02/2015  . Anorexia   . Adjustment disorder with mixed emotional features 09/01/2015  . Acute encephalopathy 08/29/2015  . Elevated troponin 08/29/2015  . Renal cancer ([place]) 08/28/2015  . Spinal stenosis of lumbar region with radiculopathy 08/07/2015  . COPD (chronic obstructive pulmonary disease) ([place])   . OSA (obstructive sleep apnea) 07/27/2015  . HNP (herniated nucleus pulposus), lumbar 07/24/2015  . Renal cell carcinoma ([place]) 07/23/2015  . Essential hypertension 07/12/2015  . Renal mass 07/12/2015  . PMR (polymyalgia rheumatica) ([place]) 09/09/2013  . Rheumatoid arthritis ([place]) 04/13/2012  . CAD (coronary artery disease) 12/19/2011  . Type 2 diabetes mellitus with other specified complication ([place]) 32/67/1245  . Neuropathy ([place]) 12/19/2011    Past Surgical History:  Procedure Laterality Date  . APPENDECTOMY    . BIOPSY  01/23/2019   Procedure: BIOPSY;  Surgeon:  Otis Brace, MD;  Location: [facility];  Service: Gastroenterology;;  . [place] 2000   left  . CATARACT EXTRACTION W/PHACO Right 11/28/2014   Procedure: CATARACT EXTRACTION PHACO AND INTRAOCULAR LENS PLACEMENT ([place]) RIGHT ;  Surgeon: Marylynn Pearson, MD;  Location: [facility];  Service: Ophthalmology;  Laterality: Right;  . CORONARY ARTERY BYPASS GRAFT  03/17/1995   Wynonia Lawman; followed every six months.  . ESOPHAGOGASTRODUODENOSCOPY (EGD) WITH PROPOFOL Left 01/23/2019   Procedure: ESOPHAGOGASTRODUODENOSCOPY (EGD) WITH PROPOFOL;  Surgeon: Otis Brace, MD;  Location: [facility];  Service: Gastroenterology;  Laterality: Left;  . HERNIA REPAIR    . LUMBAR LAMINECTOMY/DECOMPRESSION MICRODISCECTOMY N/A 07/30/2015   Procedure: LUMBAR LAMINECTOMY DISCECTOMY ;  Surgeon: Ashok Pall, MD;  Location: [facility];  Service: Neurosurgery;  Laterality: N/A;  LUMBAR LAMINECTOMY DISCECTOMY   . LUMBAR LAMINECTOMY/DECOMPRESSION MICRODISCECTOMY N/A 09/06/2015   Procedure: Redo L3/4 Disectomy;  Surgeon: Ashok Pall, MD;  Location: [facility];  Service: Neurosurgery;  Laterality: N/A;  . PROSTATE SURGERY     TURP at [place].  Marland Kitchen TRANSURETHRAL RESECTION OF PROSTATE         Family History  Problem Relation Age of Onset  . Hypertension Other   . Diabetes Brother   . Kidney failure Brother     Social History   Tobacco Use  . Smoking status: Former Smoker    Types: Cigarettes    Quit date: 03/16/1957    Years since quitting: 63.2  . Smokeless tobacco: Never Used  Vaping Use  . Vaping Use: Never used  Substance Use Topics  . Alcohol use: No  . Drug use: No    Home Medications Prior to Admission medications   Medication Sig Start Date End Date Taking? Authorizing Provider  acetaminophen (TYLENOL) 500 MG tablet Take 500 mg by mouth every 6 (six) hours as needed for moderate pain.   Yes [provider]  apixaban (ELIQUIS) 5 MG TABS tablet Take 5 mg by mouth 2 (two) times  daily.   Yes [provider]  atorvastatin (LIPITOR) 20 MG tablet Take 20 mg by mouth daily.   Yes [provider]  busPIRone (BUSPAR) 7.5 MG tablet Take 7.5 mg by mouth 2 (two) times daily.   Yes [provider]  Cholecalciferol 50 MCG (2000 UT) CAPS Take 2,000 Units by mouth daily.   Yes [provider]  denosumab (PROLIA) 60 MG/ML SOSY injection Inject 60 mg into the skin every 6 (six) months.   Yes [provider]  ferrous sulfate 325 (65 FE) MG tablet Take 325 mg by mouth See admin instructions. Takes 1 tablet Mon, Wed, Fri   Yes [provider]  finasteride (PROSCAR) 5 MG tablet Take 5 mg by mouth daily.    Yes [provider]  furosemide (LASIX) 40 MG tablet TAKE 1 TABLET BY MOUTH TWICE A DAY Patient taking differently: Take 40 mg by mouth 2 (two) times daily. 03/18/20  Yes Gifford Shave, MD  insulin lispro protamine-lispro (HUMALOG 75/25 MIX) (75-25) 100 UNIT/ML SUSP injection Inject 20-30 Units into the skin 2 (two) times daily with a meal. Sliding scale   Yes [provider]  latanoprost (XALATAN) 0.005 % ophthalmic solution Place 1 drop into both eyes at bedtime.   Yes [provider]  loratadine (CLARITIN) 10 MG tablet Take 10 mg by mouth daily.   Yes [provider]  metoprolol tartrate (LOPRESSOR) 50 MG tablet Take 50 mg by mouth 2 (two) times daily.   Yes [provider]  nitroGLYCERIN (NITROSTAT) 0.4 MG SL tablet Place 0.4 mg under the tongue every 5 (five) minutes as needed for chest pain.   Yes [provider]  ondansetron (ZOFRAN) 8 MG tablet Take 8 mg by mouth every 8 (eight) hours as needed for nausea or vomiting.   Yes [provider]  pilocarpine (PILOCAR) 1 % ophthalmic solution Place 1 drop into both eyes 2 (two) times daily.   Yes [provider]  polyethylene glycol (MIRALAX / GLYCOLAX) 17 g packet Take 17 g by mouth daily.   Yes [provider]  predniSONE (DELTASONE) 5 MG tablet Take 5 mg by mouth daily with breakfast.    Yes [provider]  pregabalin (LYRICA) 25 MG capsule Take 50 mg by mouth at bedtime.   Yes [provider]  SEMAGLUTIDE,0.25 OR 0.5MG /DOS, [place] Inject 0.5 mg into the skin once a week. Mondays   Yes [provider]  traZODone (DESYREL) 50 MG tablet Take 25 mg by mouth at bedtime.   Yes [provider]  amLODipine (NORVASC) 10 MG tablet Take 1 tablet (10 mg total) by mouth daily. Patient not taking: Reported on 05/28/2020 05/03/20   Tobb, Godfrey Pick, DO    Allergies    Ace inhibitors, Actonel [risedronate], Ciprocinonide [fluocinolone], Flunisolide, Gabapentin, Metformin and related, Sertraline, Sulindac, Terazosin, Ciprofloxacin, and Oxycodone  Review of Systems   Review of Systems  All other systems reviewed and are negative.   Physical Exam Updated Vital Signs BP (!) 162/97   Pulse 73   Temp 98 F (36.7 C) (Oral)   Resp 16   SpO2 98%   Physical Exam Vitals and nursing note reviewed.  Constitutional:      General: He is not in acute distress.    Appearance: He is well-developed. He is obese. He is ill-appearing. He is not toxic-appearing or diaphoretic.  HENT:     Head: Normocephalic and atraumatic.     Right Ear: External ear normal.     Left Ear: External ear normal.     Mouth/Throat:     Mouth: Mucous membranes are moist.     Pharynx: No oropharyngeal exudate.  Eyes:     Conjunctiva/sclera: Conjunctivae normal.     Pupils: Pupils are equal, round, and reactive to light.  Neck:     Trachea: Phonation normal.  Cardiovascular:     Rate and Rhythm: Normal rate and regular rhythm.     Heart sounds: Normal heart sounds.  Pulmonary:     Effort: Pulmonary effort is normal.     Breath sounds: Rales present. No rhonchi.  Abdominal:     Palpations: Abdomen is soft.     Tenderness: There is no abdominal tenderness.  Musculoskeletal:        General:  Normal range of motion.     Cervical back: Normal range of motion and neck supple.     Right lower leg: Edema present.     Left lower leg: Edema present.  Skin:    General: Skin is warm and dry.  Neurological:     Mental Status: He is alert and oriented to person, place, and time.     Cranial Nerves: No cranial nerve deficit.     Sensory: No sensory deficit.     Motor: No abnormal muscle tone.     Coordination: Coordination normal.  Psychiatric:        Mood and Affect: Mood normal.        Behavior: Behavior normal.        Thought Content: Thought content normal.        Judgment: Judgment normal.     ED Results / Procedures / Treatments   Labs (all labs ordered are listed, but only abnormal results are displayed) Labs Reviewed  COMPREHENSIVE METABOLIC PANEL - Abnormal; Notable for the following components:      Result Value   Glucose, Bld 164 (*)    BUN 32 (*)    Creatinine, Ser 2.47 (*)    Calcium 8.4 (*)    Total Bilirubin 1.8 (*)    GFR, Estimated 26 (*)    All other components within normal limits  BRAIN NATRIURETIC PEPTIDE - Abnormal; Notable for the following components:   B Natriuretic Peptide 4,485.9 (*)    All other components within normal limits  CBC WITH DIFFERENTIAL/PLATELET - Abnormal; Notable for the following components:   RBC 3.54 (*)    Hemoglobin  11.1 (*)    HCT 37.0 (*)    MCV 104.5 (*)    RDW 17.4 (*)    All other components within normal limits  BLOOD GAS, VENOUS - Abnormal; Notable for the following components:   pO2, Ven <31.0 (*)    All other components within normal limits  URINALYSIS, ROUTINE W REFLEX MICROSCOPIC - Abnormal; Notable for the following components:   Hgb urine dipstick SMALL (*)    Protein, ur >300 (*)    Nitrite POSITIVE (*)    Leukocytes,Ua SMALL (*)    All other components within normal limits  URINALYSIS, MICROSCOPIC (REFLEX) - Abnormal; Notable for the following components:   Bacteria, UA RARE (*)    All other  components within normal limits  SARS CORONAVIRUS 2 (TAT 6-24 HRS)  URINE CULTURE    EKG EKG Interpretation  Date/Time:  Wednesday May 29 2020 13:39:24 EDT Ventricular Rate:  79 PR Interval:    QRS Duration: 162 QT Interval:  453 QTC Calculation: 520 R Axis:   -169 Text Interpretation: Sinus rhythm Right bundle branch block Abnormal T, consider ischemia, lateral leads Since last tracing Nonspecific T wave abnormality is present Otherwise no significant change Confirmed by Daleen Bo [phone]) on 06/04/2020 3:17:49 PM   Radiology DG Chest 2 View  Result Date: 05/21/2020 CLINICAL DATA:  Increasing shortness of breath over the past few days. EXAM: CHEST - 2 VIEW COMPARISON:  Single-view of the chest 01/19/2019 in 11/21/2015. FINDINGS: There is cardiomegaly and interstitial pulmonary edema. Small to moderate pleural effusions and basilar airspace disease are much worse on the left. Aortic atherosclerosis is noted. No pneumothorax. No focal bony abnormality. IMPRESSION: Cardiomegaly and pulmonary edema. Small to moderate pleural effusions and basilar airspace disease, worse on the left. Airspace disease is likely due to atelectasis but could reflect pneumonia. Electronically Signed   By: Inge Rise M.D.   On: 05/19/2020 14:16    Procedures .Critical Care Performed by: Daleen Bo, MD Authorized by: Daleen Bo, MD   Critical care provider statement:    Critical care time (minutes):  35   Critical care start time:  06/04/2020 2:05 PM   Critical care end time:  05/20/2020 8:22 PM   Critical care time was exclusive of:  Separately billable procedures and treating other patients   Critical care was necessary to treat or prevent imminent or life-threatening deterioration of the following conditions:  Cardiac failure   Critical care was time spent personally by me on the following activities:  Blood draw for specimens, development of treatment plan with patient or surrogate,  discussions with consultants, evaluation of patient's response to treatment, examination of patient, obtaining history from patient or surrogate, ordering and performing treatments and interventions, ordering and review of laboratory studies, pulse oximetry, re-evaluation of patient's condition, review of old charts and ordering and review of radiographic studies     Medications Ordered in ED Medications  cefTRIAXone (ROCEPHIN) 1 g in sodium chloride 0.9 % 100 mL IVPB (has no administration in time range)  furosemide (LASIX) injection 60 mg (60 mg Intravenous Given 06/07/2020 1507)  ondansetron (ZOFRAN) injection 4 mg (4 mg Intravenous Given 05/23/2020 1607)    ED Course  I have reviewed the triage vital signs and the nursing notes.  Pertinent labs & imaging results that were available during my care of the patient were reviewed by me and considered in my medical decision making (see chart for details).  Clinical Course as of 05/25/2020 1945  Wed May 29, 2020  1945 At this time agitation 96% on room air.  No respiratory distress at this time.  Repeat blood pressure at 1930, elevated 162/97.  Patient now agreeable to hospitalization. [EW]    Clinical Course User Index [EW] Daleen Bo, MD   MDM Rules/Calculators/A&P                           Patient Vitals for the past 24 hrs:  BP Temp Temp src Pulse Resp SpO2  06/09/2020 1930 (!) 162/97 -- -- 73 16 98 %  05/28/2020 1912 -- 98 F (36.7 C) Oral -- -- --  05/16/2020 1900 (!) 165/109 -- -- 78 15 96 %  06/08/2020 1830 (!) 168/103 -- -- 74 14 98 %  06/12/2020 1800 (!) 164/107 -- -- 75 15 98 %  06/05/2020 1730 (!) 161/94 -- -- 74 14 97 %  05/23/2020 1700 (!) 160/98 -- -- 74 (!) 21 100 %  06/01/2020 1600 (!) 155/99 -- -- 77 (!) 22 99 %  05/26/2020 1429 (!) 163/100 -- -- 76 18 98 %  05/27/2020 1400 (!) 163/100 -- -- 78 -- 95 %  05/28/2020 1340 (!) 155/105 (!) 97.5 F (36.4 C) Oral 88 (!) 25 96 %    7:41 PM Reevaluation with update and discussion. After  initial assessment and treatment, an updated evaluation reveals patient reluctantly agrees to stay in the hospital at this time.  At this time he has urinated only 200 cc, after IV Lasix.  Findings discussed with patient, and daughter, all questions answered. Daleen Bo   Medical Decision Making:  This patient is presenting for evaluation of shortness of breath and leg swelling, which does require a range of treatment options, and is a complaint that involves a moderate risk of morbidity and mortality. The differential diagnoses include CHF, pneumonia, metabolic disorder, acute infection. I decided to review old records, and in summary elderly male, being managed at home, following recent hospitalization and rehab, presenting for shortness of breath with leg swelling.  He has a history of heart failure..  I obtained additional historical information from daughter at bedside.  Clinical Laboratory Tests Ordered, included CBC, Metabolic panel, Urinalysis and BMP, urine culture, Covid screening. Review indicates possible UTI, nitrite positive, without elevation of red or white cells.  Hemoglobin slightly low, white count normal.  BNP markedly elevated over baseline.  Glucose high, BUN high, creatinine high, calcium low. Radiologic Tests Ordered, included chest x-ray.  I independently Visualized: Radiograph images, which show pulmonary edema, with nonspecific infiltrate  Cardiac Monitor Tracing which shows normal sinus rhythm    Critical Interventions-clinical evaluation, laboratory testing, chest x-ray, Lasix treatment, wound care observation and reassessment  After These Interventions, the Patient was reevaluated and was found with elevated BNP, and likely congestive heart failure.  Patient with elevated creatinine however it is better than when hospitalized last month, when it was as high as 5.06.  He was also treated for UTI and that admission, with Rocephin.  He had a cardiac echo indicating  normal ejection fraction by report.  Patient has healing wounds of his feet, following blistering, they do not appear infected at this time.  He has been additionally troubled by lack of home health services, which had been intended following discharge from rehab, 2 days ago.  He will need evaluation by social services to improve home health services, following hospital discharge.  CRITICAL CARE-yes Performed by: Daleen Bo  Nursing Notes Reviewed/  Care Coordinated Applicable Imaging Reviewed Interpretation of Laboratory Data incorporated into ED treatment  7:43 PM-Consult complete with hospitalist. Patient case explained and discussed.  He agrees to admit patient for further evaluation and treatment. Call ended at 8:20 PM     Final Clinical Impression(s) / ED Diagnoses Final diagnoses:  Acute on chronic congestive heart failure, unspecified heart failure type ([place])  Urinary tract infection without hematuria, site unspecified  Multiple open wounds of foot    Rx / DC Orders ED Discharge Orders    None       Daleen Bo, MD 06/04/2020 2021    Daleen Bo, MD 05/14/2020 2022

## 2020-05-29 NOTE — ED Notes (Signed)
Updated wife regarding patient's plan of care-waiting for BNP to result and then possible discharge per provider-wife will call back at a later time for another update

## 2020-05-29 NOTE — Plan of Care (Signed)
  Problem: Education: Goal: Knowledge of General Education information will improve Description: Including pain rating scale, medication(s)/side effects and non-pharmacologic comfort measures Outcome: Progressing   Problem: Clinical Measurements: Goal: Ability to maintain clinical measurements within normal limits will improve Outcome: Progressing Goal: Will remain free from infection Outcome: Progressing Goal: Diagnostic test results will improve Outcome: Progressing Goal: Respiratory complications will improve Outcome: Progressing Goal: Cardiovascular complication will be avoided Outcome: Progressing   Problem: Activity: Goal: Risk for activity intolerance will decrease Outcome: Progressing   Problem: Nutrition: Goal: Adequate nutrition will be maintained Outcome: Progressing   Problem: Coping: Goal: Level of anxiety will decrease Outcome: Progressing   Problem: Pain Managment: Goal: General experience of comfort will improve Outcome: Progressing   Problem: Safety: Goal: Ability to remain free from injury will improve Outcome: Progressing   Problem: Skin Integrity: Goal: Risk for impaired skin integrity will decrease Outcome: Progressing

## 2020-05-29 NOTE — Telephone Encounter (Signed)
Left a message to return my call.

## 2020-05-29 NOTE — H&P (Signed)
History and Physical    Jacob [name] EML:[id number] DOB: [birth year] DOA: 05/21/2020  PCP: Verda Cumins, MD  Patient coming from: Home   Chief Complaint:  Chief Complaint  Patient presents with  . Shortness of Breath     HPI:    80 year old male with past medical history of diastolic congestive heart failure (Echo at [facility] with preserved EF Feb 2022), chronic kidney disease stage IV, insulin-dependent diabetes mellitus type 2, spinal stenosis (S/P laminectomy 2017), paroxysmal atrial fibrillation, hyperlipidemia, anxiety disorder, coronary artery disease  (S/P CABG 1997),  COPD, right bundle branch block and obstructive sleep apnea (not on CPAP) who presents to [facility] emergency department with complaints of leg swelling and weeping wounds of the bilateral feet.  Of note, patient was recently hospitalized at [facility] from 2/11 until 2/28.  During that hospitalization, patient was managed for acute hypercapnic respiratory failure felt to be secondary to aspiration pneumonia with hospital course complicated by acute metabolic encephalopathy felt to be multifactorial as well as acute kidney injury superimposed on chronic kidney disease.  Patient was treated with a course of antibiotics to treat both UTI and suspected pneumonia.  After patient's hospitalization, he was discharged to a skilled nursing facility.  On Monday, 3/14 patient was discharged home.  The patient and his daughter both report that within 24 hours of arriving home patient began to develop rapidly progressive bilateral lower extremity swelling.  Swelling worsened daily and became associated with progressively worsening shortness of breath.  Shortness of breath is worse with exertion and improved with rest.  Shortness of breath is moderate to severe in intensity.  As patient symptoms worsened the patient also complains of associated paroxysmal nocturnal dyspnea and pillow orthopnea.  Patient  is also complaining of increasing weeping of the wounds of the dorsum of the bilateral feet as patient's leg swelling progressed.   With this constellation of symptoms the patient initially presented to [facility] emergency department for evaluation.  Upon evaluation in the emergency department, patient was found to have a BNP of 4485 with pulmonary edema and bilateral pleural effusions on chest x-ray.  Patient was found to have clinical evidence of acute congestive heart failure and was initiated on intravenous Lasix.  The hospitalist group was then called to assess the patient for mission the hospital.   Review of Systems:   Review of Systems  Respiratory: Positive for shortness of breath.   Cardiovascular: Positive for leg swelling and PND.  All other systems reviewed and are negative.   Past Medical History:  Diagnosis Date  . Acute encephalopathy 08/29/2015  . Acute on chronic congestive heart failure ([place])   . Acute renal failure superimposed on stage 4 chronic kidney disease ([place]) 10/02/2015  . Adjustment disorder with mixed emotional features 09/01/2015  . AKI (acute kidney injury) ([place])   . Allergy   . Anemia   . Anxiety   . Arthritis   . Asthma   . BPH (benign prostatic hyperplasia)   . CAD (coronary artery disease) 12/19/2011  . Cancer of kidney ([place])   . Cataract   . CHF (congestive heart failure) ([place])   . Chronic anticoagulation 08/22/2018  . Chronic diastolic heart failure ([place]) 11/27/2015  . Chronic kidney disease    chronic  kidney failure  kidney function at 42%  . CKD (chronic kidney disease) 11/27/2015  . COPD (chronic obstructive pulmonary disease) ([place])   . Coronary artery disease    CABG  7 bypasses  . Dehydration   . Diabetes mellitus without complication ([place])   . Elevated troponin 08/29/2015  . Essential hypertension 07/12/2015  . Facial cellulitis   . GERD (gastroesophageal reflux disease)   . Gout   . Hepatitis    many years ago  . High risk  medication use 08/22/2018  . History of CHF (congestive heart failure)   . HNP (herniated nucleus pulposus), lumbar 07/24/2015  . Hyperlipidemia   . Hypertension   . Hypertensive heart disease 04/24/2018  . Hypokalemia   . Hypotension 10/02/2015  . Lumbar disc disease   . Lumbar radiculopathy 05/05/2018  . Neuropathy ([place]) 12/19/2011  . Normocytic anemia 11/27/2015  . Obesity   . On amiodarone therapy 08/22/2018  . OSA (obstructive sleep apnea) 07/27/2015  . Paroxysmal atrial fibrillation (HCC)   . Peripheral neuropathy   . PMR (polymyalgia rheumatica) ([place]) 09/09/2013  . Polymyalgia rheumatica (HCC)    maintained on Prednisone, Plaquenil. Followed by rhuematology every 4 months/[name].  . Renal cell carcinoma ([place]) 07/23/2015  . Renal mass 07/12/2015  . Rheumatoid arthritis ([place]) 04/13/2012  . Shortness of breath dyspnea    with exertion  . Sleep apnea    CPAP   Trying to use  . Spinal stenosis of lumbar region with radiculopathy 08/07/2015  . Type 2 diabetes mellitus with other specified complication ([place]) 57/05/2200    Past Surgical History:  Procedure Laterality Date  . APPENDECTOMY    . BIOPSY  01/23/2019   Procedure: BIOPSY;  Surgeon: Otis Brace, MD;  Location: [facility];  Service: Gastroenterology;;  . [place] 2000   left  . CATARACT EXTRACTION W/PHACO Right 11/28/2014   Procedure: CATARACT EXTRACTION PHACO AND INTRAOCULAR LENS PLACEMENT ([place]) RIGHT ;  Surgeon: Marylynn Pearson, MD;  Location: [place];  Service: Ophthalmology;  Laterality: Right;  . CORONARY ARTERY BYPASS GRAFT  03/17/1995   [place]ynonia Lawman; followed every six months.  . ESOPHAGOGASTRODUODENOSCOPY (EGD) WITH PROPOFOL Left 01/23/2019   Procedure: ESOPHAGOGASTRODUODENOSCOPY (EGD) WITH PROPOFOL;  Surgeon: Otis Brace, MD;  Location: [facility];  Service: Gastroenterology;  Laterality: Left;  . HERNIA REPAIR    . LUMBAR LAMINECTOMY/DECOMPRESSION MICRODISCECTOMY N/A 07/30/2015   Procedure: LUMBAR  LAMINECTOMY DISCECTOMY ;  Surgeon: Ashok Pall, MD;  Location: [place] [facility];  Service: Neurosurgery;  Laterality: N/A;  LUMBAR LAMINECTOMY DISCECTOMY   . LUMBAR LAMINECTOMY/DECOMPRESSION MICRODISCECTOMY N/A 09/06/2015   Procedure: Redo L3/4 Disectomy;  Surgeon: Ashok Pall, MD;  Location: [place] [facility];  Service: Neurosurgery;  Laterality: N/A;  . PROSTATE SURGERY     TURP at [place].  Marland Kitchen TRANSURETHRAL RESECTION OF PROSTATE       reports that he quit smoking about 63 years ago. His smoking use included cigarettes. He has never used smokeless tobacco. He reports that he does not drink alcohol and does not use drugs.  Allergies  Allergen Reactions  . Ace Inhibitors Other (See Comments)    Probably nausea and vomiting per patient   . Actonel [Risedronate] Nausea And Vomiting  . Ciprocinonide [Fluocinolone] Other (See Comments)    Probably nausea and vomiting per patient  . Flunisolide Other (See Comments)    Probably nausea and vomiting per patient   . Gabapentin     Other reaction(s): Coordination problem, Other (See Comments) tremors   . Metformin And Related Other (See Comments)    Probably nausea and vomiting per patient   . Sertraline Other (See Comments)    Probably nausea and vomiting per patient   .  Sulindac Other (See Comments)    Probably nausea and vomiting per patient   . Terazosin Other (See Comments)    Probably nausea and vomiting per patient   . Ciprofloxacin Nausea And Vomiting    Other reaction(s): Edema of oral soft tissues, Irritation of penis, Edema of oral soft tissues, Irritation of penis, GI Upset (intolerance)  . Oxycodone Nausea And Vomiting    Family History  Problem Relation Age of Onset  . Hypertension Other   . Diabetes Brother   . Kidney failure Brother      Prior to Admission medications   Medication Sig Start Date End Date Taking? Authorizing Provider  acetaminophen (TYLENOL) 500 MG tablet Take 500 mg by mouth every 6 (six) hours as needed  for moderate pain.   Yes [provider]  apixaban (ELIQUIS) 5 MG TABS tablet Take 5 mg by mouth 2 (two) times daily.   Yes [provider]  atorvastatin (LIPITOR) 20 MG tablet Take 20 mg by mouth daily.   Yes [provider]  busPIRone (BUSPAR) 7.5 MG tablet Take 7.5 mg by mouth 2 (two) times daily.   Yes [provider]  Cholecalciferol 50 MCG (2000 UT) CAPS Take 2,000 Units by mouth daily.   Yes [provider]  denosumab (PROLIA) 60 MG/ML SOSY injection Inject 60 mg into the skin every 6 (six) months.   Yes [provider]  ferrous sulfate 325 (65 FE) MG tablet Take 325 mg by mouth See admin instructions. Takes 1 tablet Mon, Wed, Fri   Yes [provider]  finasteride (PROSCAR) 5 MG tablet Take 5 mg by mouth daily.    Yes [provider]  furosemide (LASIX) 40 MG tablet TAKE 1 TABLET BY MOUTH TWICE A DAY Patient taking differently: Take 40 mg by mouth 2 (two) times daily. 03/18/20  Yes Gifford Shave, MD  insulin lispro protamine-lispro (HUMALOG 75/25 MIX) (75-25) 100 UNIT/ML SUSP injection Inject 20-30 Units into the skin 2 (two) times daily with a meal. Sliding scale   Yes [provider]  latanoprost (XALATAN) 0.005 % ophthalmic solution Place 1 drop into both eyes at bedtime.   Yes [provider]  loratadine (CLARITIN) 10 MG tablet Take 10 mg by mouth daily.   Yes [provider]  metoprolol tartrate (LOPRESSOR) 50 MG tablet Take 50 mg by mouth 2 (two) times daily.   Yes [provider]  nitroGLYCERIN (NITROSTAT) 0.4 MG SL tablet Place 0.4 mg under the tongue every 5 (five) minutes as needed for chest pain.   Yes [provider]  ondansetron (ZOFRAN) 8 MG tablet Take 8 mg by mouth every 8 (eight) hours as needed for nausea or vomiting.   Yes [provider]  pilocarpine (PILOCAR) 1 % ophthalmic solution Place 1 drop into both eyes 2 (two) times daily.   Yes [provider]  polyethylene glycol (MIRALAX / GLYCOLAX) 17 g packet Take 17 g by mouth daily.   Yes [provider]  predniSONE (DELTASONE) 5 MG tablet Take 5 mg by mouth daily with breakfast.    Yes [provider]  pregabalin (LYRICA) 25 MG capsule Take 50 mg by mouth at bedtime.   Yes [provider]  SEMAGLUTIDE,0.25 OR 0.5MG /DOS,  Inject 0.5 mg into the skin once a week. Mondays   Yes [provider]  traZODone (DESYREL) 50 MG tablet Take 25 mg by mouth at bedtime.   Yes [provider]  amLODipine (NORVASC) 10 MG  tablet Take 1 tablet (10 mg total) by mouth daily. Patient not taking: Reported on 05/26/2020 05/03/20   Berniece Salines, DO    Physical Exam: Vitals:   06/09/2020 2100 05/15/2020 2145 05/17/2020 2230 06/07/2020 2241  BP: (!) 154/93 (!) 155/80  (!) 142/85  Pulse: 78 80  75  Resp: 16 16  (!) 24  Temp: 98 F (36.7 C) 98 F (36.7 C)  98.3 F (36.8 C)  TempSrc:  Oral  Oral  SpO2: 91% 94% 94% (!) 83%  Weight:    89.6 kg  Height:    5\' 11"  (1.803 m)    Constitutional: Patient is somewhat lethargic but arousable and oriented x3.  Patient is in mild respiratory distress.   Skin: Shallow wounds of ruptured blisters on the dorsal surface of the bilateral feet.  Marked amount of serous drainage noted.  No evidence of foul smell induration or warmth.  good skin turgor noted. Eyes: Pupils are equally reactive to light.  No evidence of scleral icterus or conjunctival pallor.  ENMT: Moist mucous membranes noted.  Posterior pharynx clear of any exudate or lesions.  Neck: normal, supple, no masses, no thyromegaly.  No evidence of jugular venous distension.   Respiratory: Significant bibasilar mid field rales noted with reduced breath sounds at the bases.  No significant evidence of wheezing at this time.  Patient is exhibiting evidence of increased respiratory effort with evidence of accessory muscle use. Cardiovascular: Regular rate and rhythm, no  murmurs / rubs / gallops.  Extensive bilateral lower extremity pitting edema that tracks in the feet all the way up to the thighs.  2+ pedal pulses. No carotid bruits.  Chest:   Nontender without crepitus or deformity.   Back:   Nontender without crepitus or deformity. Abdomen: Abdomen is protuberant but soft and nontender.  No evidence of intra-abdominal masses.  Positive bowel sounds noted in all quadrants.   Musculoskeletal: No joint deformity upper and lower extremities. Good ROM, no contractures. Normal muscle tone.  Neurologic: CN 2-12 grossly intact. Sensation intact.  Patient moving all 4 extremities spontaneously.  Patient is following all commands.  Patient is responsive to verbal stimuli.   Psychiatric: Patient exhibits normal mood with appropriate affect.  Patient seems to possess insight as to their current situation.     Labs on Admission: I have personally reviewed following labs and imaging studies -   CBC: Recent Labs  Lab 06/05/2020 1500  WBC 6.5  NEUTROABS 5.2  HGB 11.1*  HCT 37.0*  MCV 104.5*  PLT 222   Basic Metabolic Panel: Recent Labs  Lab 06/03/2020 1500  NA 143  K 4.7  CL 109  CO2 22  GLUCOSE 164*  BUN 32*  CREATININE 2.47*  CALCIUM 8.4*   GFR: Estimated Creatinine Clearance: 25.4 mL/min (A) (by C-G formula based on SCr of 2.47 mg/dL (H)). Liver Function Tests: Recent Labs  Lab 06/08/2020 1500  AST 33  ALT 15  ALKPHOS 115  BILITOT 1.8*  PROT 7.7  ALBUMIN 3.8   No results for input(s): LIPASE, AMYLASE in the last 168 hours. No results for input(s): AMMONIA in the last 168 hours. Coagulation Profile: No results for input(s): INR, PROTIME in the last 168 hours. Cardiac Enzymes: No results for input(s): CKTOTAL, CKMB, CKMBINDEX, TROPONINI in the last 168 hours. BNP (last 3 results) No results for input(s): PROBNP in the last 8760 hours. HbA1C: No results for input(s): HGBA1C in the last 72 hours. CBG: Recent Labs  Lab 06/04/2020 2120  GLUCAP  126*   Lipid Profile: No results for input(s): CHOL, HDL, LDLCALC, TRIG, CHOLHDL, LDLDIRECT in the last 72 hours. Thyroid Function Tests: No results for input(s): TSH, T4TOTAL, FREET4, T3FREE, THYROIDAB in the last 72 hours. Anemia Panel: No results for input(s): VITAMINB12, FOLATE, FERRITIN, TIBC, IRON, RETICCTPCT in the last 72 hours. Urine analysis:    Component Value Date/Time   COLORURINE YELLOW 05/17/2020 1600   APPEARANCEUR CLEAR 06/13/2020 1600   LABSPEC 1.025 06/13/2020 1600   PHURINE 6.0 06/11/2020 1600   GLUCOSEU NEGATIVE 05/22/2020 1600   HGBUR SMALL (A) 06/09/2020 1600   BILIRUBINUR NEGATIVE 06/03/2020 1600   BILIRUBINUR neg 09/09/2013 1233   KETONESUR NEGATIVE 06/04/2020 1600   PROTEINUR >300 (A) 06/02/2020 1600   UROBILINOGEN 0.2 09/09/2013 1233   NITRITE POSITIVE (A) 05/19/2020 1600   LEUKOCYTESUR SMALL (A) 06/04/2020 1600    Radiological Exams on Admission - Personally Reviewed: DG Chest 2 View  Result Date: 06/03/2020 CLINICAL DATA:  Increasing shortness of breath over the past few days. EXAM: CHEST - 2 VIEW COMPARISON:  Single-view of the chest 01/19/2019 in 11/21/2015. FINDINGS: There is cardiomegaly and interstitial pulmonary edema. Small to moderate pleural effusions and basilar airspace disease are much worse on the left. Aortic atherosclerosis is noted. No pneumothorax. No focal bony abnormality. IMPRESSION: Cardiomegaly and pulmonary edema. Small to moderate pleural effusions and basilar airspace disease, worse on the left. Airspace disease is likely due to atelectasis but could reflect pneumonia. Electronically Signed   By: Inge Rise M.D.   On: 06/13/2020 14:16    EKG: Personally reviewed.  Rhythm is normal sinus rhythm with heart rate of 79 bpm.  No dynamic ST segment changes appreciated.  Assessment/Plan Principal Problem:   Acute on chronic diastolic CHF (congestive heart failure) ([place])   Patient presents with several day history of rapidly  progressive bilateral lower extremity edema, complaints of shortness of breath, paroxysmal nocturnal dyspnea and pillow orthopnea  Upon evaluation in the emergency department patient found to have a markedly elevated BNP of 4485 with evidence of pulmonary edema on chest x-ray and bilateral pleural effusions all concerning for acute congestive heart failure  Patient has been placed on 60 mg of IV Lasix twice daily Strict input and output monitoring Supplemental oxygen for bouts of hypoxia Monitoring renal function and electrolytes with serial chemistires  Active Problems:   Type 2 diabetes mellitus with diabetic polyneuropathy, with long-term current use of insulin ([place]) . Resuming home regimen of 70/30 BID . Discharge summary from [facility] health next mention that patient's blood sugars are poorly controlled, will need to titrate patient's insulin regimen to achieve glycemic control . Patient been placed on Accu-Cheks before every meal and nightly with sliding scale insulin . Hemoglobin A1C ordered . Diabetic Diet    COPD (chronic obstructive pulmonary disease) (HCC)   In the absence of wheezing or prolonged expiratory phase on examination I do not believe the patient is suffering from a superimposed COPD exacerbation  PRN Bronchodilator therapy for shortness of breath and wheezing    AF (paroxysmal atrial fibrillation) ([place])   Patient is typically on Eliquis 5 mg twice daily however patient is now 80 years old with a creatinine of greater than 1.5 and therefore 2.5 mg twice daily should suffice  Continuing home regimen of metoprolol.  Continue to monitor on telemetry  Currently rate controlled and in sinus rhythm    Coronary artery disease involving native heart without angina pectoris  . Patient is currently  chest pain free . Monitoring patient on telemetry . Continue home regimen of antiplatelet therapy, lipid lowering therapy and AV nodal blocking therapy     CKD (chronic kidney disease), stage IV ([place])  . Strict intake and output monitoring . Creatinine near baseline . Minimizing nephrotoxic agents as much as possible . Serial chemistries to monitor renal function and electrolytes    Prolonged QT interval   Notable prolonged QT interval on admission EKG  Minimizing use of QT prolonging agents of much as possible  Ensuring patient's electrolytes are all corrected  Monitoring patient on telemetry  Bilateral wounds f the dorsum of the feet   Patient is exhibiting significant serous drainage from these wounds due to severe bilateral lower extremity pitting edema from volume overload  No clinical evidence of infection  Wound care consultation for assistance with daily dressing changes    Mixed diabetic hyperlipidemia associated with type 2 diabetes mellitus ([place])  . Continuing home regimen of lipid lowering therapy.  Elevated troponin not due to myocardial infarction  Minimally elevated troponin in the setting of acute congestive heart failure  Patient is chest pain-free  Unlikely to be plaque rupture  Monitoring patient on telemetry  Cycling cardiac enzymes   Code Status:  Full code Family Communication: Daughter is at bedside and has been updated on plan of care  Status is: Observation  The patient remains OBS appropriate and will d/c before 2 midnights.  Dispo: The patient is from: Home              Anticipated d/c is to: Home              Patient currently is not medically stable to d/c.   Difficult to place patient No        Vernelle Emerald MD [facility] Pager [phone]  If 7PM-7AM, please contact night-coverage [email or web address] Use universal [place] password for that web site. If you do not have the password, please call the hospital operator.  05/30/2020, 1:52 AM

## 2020-05-29 NOTE — Telephone Encounter (Signed)
I think he is best to follow-up with that what ever physician was seeing him post hospital and deal with this problem outside of telephone calls.  My purpose was to reconcile his medications.  He should have designated hospital follow-up.  This was not a cardiac admission to the hospital

## 2020-05-29 NOTE — Telephone Encounter (Signed)
I do think it is important he follows up with his primary care physician I reviewed the hospital records and the problems were not cardiac that brought him to the hospital.  You can work him in with one of the doctors up at the [place] office or me next Tuesday.

## 2020-05-29 NOTE — Telephone Encounter (Signed)
Pt c/o swelling: STAT is pt has developed SOB within 24 hours  1) How much weight have you gained and in what time span? No. Patient can not stand on his feet   2) If swelling, where is the swelling located? Feet and legs   3) Are you currently taking a fluid pill? yes  4) Are you currently SOB? no  5) Do you have a log of your daily weights (if so, list)?   6) Have you gained 3 pounds in a day or 5 pounds in a week?   7) Have you traveled recently? No  Pt c/o BP issue: STAT if pt c/o blurred vision, one-sided weakness or slurred speech  1. What are your last 5 BP readings?  160/99  165/98 153/94 152/102 147/94  2. Are you having any other symptoms (ex. Dizziness, headache, blurred vision, passed out)? no  3. What is your BP issue? Wife is concerned about his BP not coming down after adding back medicine that was reduced during his rehab stay  Patient states he has a hard time breathing when he elevates his feet while sitting in a recliner.

## 2020-05-30 DIAGNOSIS — F32A Depression, unspecified: Secondary | ICD-10-CM | POA: Diagnosis present

## 2020-05-30 DIAGNOSIS — Z66 Do not resuscitate: Secondary | ICD-10-CM | POA: Diagnosis present

## 2020-05-30 DIAGNOSIS — J159 Unspecified bacterial pneumonia: Secondary | ICD-10-CM | POA: Diagnosis present

## 2020-05-30 DIAGNOSIS — G4733 Obstructive sleep apnea (adult) (pediatric): Secondary | ICD-10-CM | POA: Diagnosis present

## 2020-05-30 DIAGNOSIS — N184 Chronic kidney disease, stage 4 (severe): Secondary | ICD-10-CM | POA: Diagnosis present

## 2020-05-30 DIAGNOSIS — Z7189 Other specified counseling: Secondary | ICD-10-CM | POA: Diagnosis not present

## 2020-05-30 DIAGNOSIS — I480 Paroxysmal atrial fibrillation: Secondary | ICD-10-CM | POA: Diagnosis present

## 2020-05-30 DIAGNOSIS — E669 Obesity, unspecified: Secondary | ICD-10-CM | POA: Diagnosis present

## 2020-05-30 DIAGNOSIS — Z515 Encounter for palliative care: Secondary | ICD-10-CM | POA: Diagnosis not present

## 2020-05-30 DIAGNOSIS — G9341 Metabolic encephalopathy: Secondary | ICD-10-CM | POA: Diagnosis present

## 2020-05-30 DIAGNOSIS — L89899 Pressure ulcer of other site, unspecified stage: Secondary | ICD-10-CM | POA: Diagnosis present

## 2020-05-30 DIAGNOSIS — J189 Pneumonia, unspecified organism: Secondary | ICD-10-CM | POA: Diagnosis present

## 2020-05-30 DIAGNOSIS — E782 Mixed hyperlipidemia: Secondary | ICD-10-CM | POA: Diagnosis present

## 2020-05-30 DIAGNOSIS — M549 Dorsalgia, unspecified: Secondary | ICD-10-CM | POA: Diagnosis present

## 2020-05-30 DIAGNOSIS — E1169 Type 2 diabetes mellitus with other specified complication: Secondary | ICD-10-CM | POA: Diagnosis present

## 2020-05-30 DIAGNOSIS — N400 Enlarged prostate without lower urinary tract symptoms: Secondary | ICD-10-CM | POA: Diagnosis present

## 2020-05-30 DIAGNOSIS — G8929 Other chronic pain: Secondary | ICD-10-CM | POA: Diagnosis present

## 2020-05-30 DIAGNOSIS — R531 Weakness: Secondary | ICD-10-CM | POA: Diagnosis not present

## 2020-05-30 DIAGNOSIS — N390 Urinary tract infection, site not specified: Secondary | ICD-10-CM | POA: Diagnosis present

## 2020-05-30 DIAGNOSIS — Z20822 Contact with and (suspected) exposure to covid-19: Secondary | ICD-10-CM | POA: Diagnosis present

## 2020-05-30 DIAGNOSIS — R9431 Abnormal electrocardiogram [ECG] [EKG]: Secondary | ICD-10-CM | POA: Diagnosis present

## 2020-05-30 DIAGNOSIS — J440 Chronic obstructive pulmonary disease with acute lower respiratory infection: Secondary | ICD-10-CM | POA: Diagnosis present

## 2020-05-30 DIAGNOSIS — I5033 Acute on chronic diastolic (congestive) heart failure: Secondary | ICD-10-CM | POA: Diagnosis not present

## 2020-05-30 DIAGNOSIS — I130 Hypertensive heart and chronic kidney disease with heart failure and stage 1 through stage 4 chronic kidney disease, or unspecified chronic kidney disease: Secondary | ICD-10-CM | POA: Diagnosis present

## 2020-05-30 DIAGNOSIS — E1122 Type 2 diabetes mellitus with diabetic chronic kidney disease: Secondary | ICD-10-CM | POA: Diagnosis present

## 2020-05-30 DIAGNOSIS — J690 Pneumonitis due to inhalation of food and vomit: Secondary | ICD-10-CM | POA: Diagnosis present

## 2020-05-30 DIAGNOSIS — L89622 Pressure ulcer of left heel, stage 2: Secondary | ICD-10-CM | POA: Diagnosis present

## 2020-05-30 DIAGNOSIS — E1142 Type 2 diabetes mellitus with diabetic polyneuropathy: Secondary | ICD-10-CM | POA: Diagnosis present

## 2020-05-30 DIAGNOSIS — A419 Sepsis, unspecified organism: Secondary | ICD-10-CM | POA: Diagnosis not present

## 2020-05-30 LAB — CBC WITH DIFFERENTIAL/PLATELET
Abs Immature Granulocytes: 0.05 10*3/uL (ref 0.00–0.07)
Basophils Absolute: 0 10*3/uL (ref 0.0–0.1)
Basophils Relative: 0 %
Eosinophils Absolute: 0 10*3/uL (ref 0.0–0.5)
Eosinophils Relative: 0 %
HCT: 36.4 % — ABNORMAL LOW (ref 39.0–52.0)
Hemoglobin: 10.7 g/dL — ABNORMAL LOW (ref 13.0–17.0)
Immature Granulocytes: 1 %
Lymphocytes Relative: 3 %
Lymphs Abs: 0.3 10*3/uL — ABNORMAL LOW (ref 0.7–4.0)
MCH: 31.2 pg (ref 26.0–34.0)
MCHC: 29.4 g/dL — ABNORMAL LOW (ref 30.0–36.0)
MCV: 106.1 fL — ABNORMAL HIGH (ref 80.0–100.0)
Monocytes Absolute: 0.3 10*3/uL (ref 0.1–1.0)
Monocytes Relative: 3 %
Neutro Abs: 8.7 10*3/uL — ABNORMAL HIGH (ref 1.7–7.7)
Neutrophils Relative %: 93 %
Platelets: 176 10*3/uL (ref 150–400)
RBC: 3.43 MIL/uL — ABNORMAL LOW (ref 4.22–5.81)
RDW: 17.6 % — ABNORMAL HIGH (ref 11.5–15.5)
WBC: 9.4 10*3/uL (ref 4.0–10.5)
nRBC: 0 % (ref 0.0–0.2)

## 2020-05-30 LAB — COMPREHENSIVE METABOLIC PANEL
ALT: 13 U/L (ref 0–44)
AST: 15 U/L (ref 15–41)
Albumin: 3.2 g/dL — ABNORMAL LOW (ref 3.5–5.0)
Alkaline Phosphatase: 103 U/L (ref 38–126)
Anion gap: 13 (ref 5–15)
BUN: 31 mg/dL — ABNORMAL HIGH (ref 8–23)
CO2: 23 mmol/L (ref 22–32)
Calcium: 8.2 mg/dL — ABNORMAL LOW (ref 8.9–10.3)
Chloride: 108 mmol/L (ref 98–111)
Creatinine, Ser: 2.69 mg/dL — ABNORMAL HIGH (ref 0.61–1.24)
GFR, Estimated: 23 mL/min — ABNORMAL LOW (ref >60)
Glucose, Bld: 108 mg/dL — ABNORMAL HIGH (ref 70–99)
Potassium: 3.9 mmol/L (ref 3.5–5.1)
Sodium: 144 mmol/L (ref 135–145)
Total Bilirubin: 1.1 mg/dL (ref 0.3–1.2)
Total Protein: 6.8 g/dL (ref 6.5–8.1)

## 2020-05-30 LAB — GLUCOSE, CAPILLARY
Glucose-Capillary: 139 mg/dL — ABNORMAL HIGH (ref 70–99)
Glucose-Capillary: 146 mg/dL — ABNORMAL HIGH (ref 70–99)
Glucose-Capillary: 140 mg/dL — ABNORMAL HIGH (ref 70–99)
Glucose-Capillary: 112 mg/dL — ABNORMAL HIGH (ref 70–99)

## 2020-05-30 LAB — TROPONIN I (HIGH SENSITIVITY): Troponin I (High Sensitivity): 97 ng/L — ABNORMAL HIGH (ref <18)

## 2020-05-30 LAB — MAGNESIUM: Magnesium: 2.2 mg/dL (ref 1.7–2.4)

## 2020-05-30 LAB — HEMOGLOBIN A1C
Hgb A1c MFr Bld: 6 % — ABNORMAL HIGH (ref 4.8–5.6)
Mean Plasma Glucose: 125.5 mg/dL

## 2020-05-30 LAB — PROCALCITONIN: Procalcitonin: 0.96 ng/mL

## 2020-05-30 MED ORDER — LORATADINE 10 MG PO TABS
10.0000 mg | ORAL_TABLET | Freq: Every day | ORAL | Status: DC
  Administered 2020-05-30: 10 mg via ORAL
  Filled 2020-05-30 (×2): qty 1

## 2020-05-30 MED ORDER — SODIUM CHLORIDE 0.9 % IV SOLN
2.0000 g | INTRAVENOUS | Status: DC
  Administered 2020-05-30: 2 g via INTRAVENOUS
  Filled 2020-05-30: qty 20

## 2020-05-30 MED ORDER — SODIUM CHLORIDE 0.9% FLUSH: 3.0000 mL | INTRAVENOUS | Status: DC | PRN

## 2020-05-30 MED ORDER — ONDANSETRON HCL 4 MG/2ML IJ SOLN: 4.0000 mg | Freq: Once | INTRAMUSCULAR | Status: DC

## 2020-05-30 MED ORDER — DOXYCYCLINE HYCLATE 100 MG IV SOLR
100.0000 mg | Freq: Two times a day (BID) | INTRAVENOUS | Status: DC
Start: 2020-05-30 — End: 2020-05-31
  Administered 2020-05-30 – 2020-05-31 (×3): 100 mg via INTRAVENOUS
  Filled 2020-05-30 (×3): qty 100

## 2020-05-30 MED ORDER — PILOCARPINE HCL 1 % OP SOLN
1.0000 [drp] | Freq: Two times a day (BID) | OPHTHALMIC | Status: DC
  Administered 2020-05-30 – 2020-05-31 (×3): 1 [drp] via OPHTHALMIC
  Filled 2020-05-30: qty 15

## 2020-05-30 MED ORDER — INSULIN ASPART PROT & ASPART (70-30 MIX) 100 UNIT/ML ~~LOC~~ SUSP
20.0000 [IU] | Freq: Two times a day (BID) | SUBCUTANEOUS | Status: DC
  Filled 2020-05-30: qty 10

## 2020-05-30 MED ORDER — AZITHROMYCIN 250 MG PO TABS: 500.0000 mg | ORAL_TABLET | Freq: Every day | ORAL | Status: DC

## 2020-05-30 MED ORDER — ALBUTEROL SULFATE (2.5 MG/3ML) 0.083% IN NEBU
2.5000 mg | INHALATION_SOLUTION | RESPIRATORY_TRACT | Status: DC | PRN
  Administered 2020-05-31: 2.5 mg via RESPIRATORY_TRACT
  Filled 2020-05-30: qty 3

## 2020-05-30 MED ORDER — PREDNISONE 5 MG PO TABS
5.0000 mg | ORAL_TABLET | Freq: Every day | ORAL | Status: DC
  Administered 2020-05-30 – 2020-05-31 (×2): 5 mg via ORAL
  Filled 2020-05-30 (×2): qty 1

## 2020-05-30 MED ORDER — ACETAMINOPHEN 325 MG PO TABS
650.0000 mg | ORAL_TABLET | ORAL | Status: DC | PRN
  Administered 2020-05-30 – 2020-05-31 (×3): 650 mg via ORAL
  Filled 2020-05-30 (×2): qty 2

## 2020-05-30 MED ORDER — PREGABALIN 50 MG PO CAPS
50.0000 mg | ORAL_CAPSULE | Freq: Every day | ORAL | Status: DC
Start: 2020-05-30 — End: 2020-05-31
  Administered 2020-05-30 (×2): 50 mg via ORAL
  Filled 2020-05-30 (×2): qty 1

## 2020-05-30 MED ORDER — LATANOPROST 0.005 % OP SOLN
1.0000 [drp] | Freq: Every day | OPHTHALMIC | Status: DC
  Administered 2020-05-30: 1 [drp] via OPHTHALMIC
  Filled 2020-05-30: qty 2.5

## 2020-05-30 MED ORDER — SODIUM CHLORIDE 0.9 % IV SOLN: 250.0000 mL | INTRAVENOUS | Status: DC | PRN

## 2020-05-30 MED ORDER — TRAZODONE HCL 50 MG PO TABS
25.0000 mg | ORAL_TABLET | Freq: Every day | ORAL | Status: DC
  Administered 2020-05-30 (×2): 25 mg via ORAL
  Filled 2020-05-30 (×2): qty 1

## 2020-05-30 MED ORDER — PROSOURCE PLUS PO LIQD
30.0000 mL | Freq: Two times a day (BID) | ORAL | Status: DC
  Administered 2020-05-30 – 2020-05-31 (×2): 30 mL via ORAL
  Filled 2020-05-30 (×2): qty 30

## 2020-05-30 MED ORDER — ACETAMINOPHEN 325 MG PO TABS
650.0000 mg | ORAL_TABLET | ORAL | Status: DC | PRN
  Filled 2020-05-30: qty 2

## 2020-05-30 MED ORDER — ADULT MULTIVITAMIN W/MINERALS CH
1.0000 | ORAL_TABLET | Freq: Every day | ORAL | Status: DC
  Filled 2020-05-30: qty 1

## 2020-05-30 MED ORDER — APIXABAN 2.5 MG PO TABS
2.5000 mg | ORAL_TABLET | Freq: Two times a day (BID) | ORAL | Status: DC
  Administered 2020-05-30 – 2020-05-31 (×3): 2.5 mg via ORAL
  Filled 2020-05-30 (×3): qty 1

## 2020-05-30 MED ORDER — NITROGLYCERIN 0.4 MG SL SUBL: 0.4000 mg | SUBLINGUAL_TABLET | SUBLINGUAL | Status: DC | PRN

## 2020-05-30 MED ORDER — METOPROLOL TARTRATE 50 MG PO TABS
50.0000 mg | ORAL_TABLET | Freq: Two times a day (BID) | ORAL | Status: DC
  Administered 2020-05-30 – 2020-05-31 (×4): 50 mg via ORAL
  Filled 2020-05-30 (×4): qty 1

## 2020-05-30 MED ORDER — APIXABAN 5 MG PO TABS
5.0000 mg | ORAL_TABLET | Freq: Two times a day (BID) | ORAL | Status: DC
  Administered 2020-05-30: 5 mg via ORAL
  Filled 2020-05-30: qty 1

## 2020-05-30 MED ORDER — SODIUM CHLORIDE 0.9% FLUSH
3.0000 mL | Freq: Two times a day (BID) | INTRAVENOUS | Status: DC
  Administered 2020-05-30 – 2020-05-31 (×4): 3 mL via INTRAVENOUS

## 2020-05-30 MED ORDER — ATORVASTATIN CALCIUM 20 MG PO TABS
20.0000 mg | ORAL_TABLET | Freq: Every day | ORAL | Status: DC
  Administered 2020-05-30 – 2020-05-31 (×2): 20 mg via ORAL
  Filled 2020-05-30 (×2): qty 1

## 2020-05-30 MED ORDER — POLYETHYLENE GLYCOL 3350 17 G PO PACK: 17.0000 g | PACK | Freq: Every day | ORAL | Status: DC | PRN

## 2020-05-30 MED ORDER — POLYETHYLENE GLYCOL 3350 17 G PO PACK
17.0000 g | PACK | Freq: Every day | ORAL | Status: DC
  Filled 2020-05-30 (×2): qty 1

## 2020-05-30 MED ORDER — FINASTERIDE 5 MG PO TABS
5.0000 mg | ORAL_TABLET | Freq: Every day | ORAL | Status: DC
  Administered 2020-05-30 – 2020-05-31 (×2): 5 mg via ORAL
  Filled 2020-05-30 (×2): qty 1

## 2020-05-30 MED ORDER — FUROSEMIDE 10 MG/ML IJ SOLN
60.0000 mg | Freq: Two times a day (BID) | INTRAMUSCULAR | Status: DC
  Administered 2020-05-30 (×2): 60 mg via INTRAVENOUS
  Filled 2020-05-30 (×2): qty 6

## 2020-05-30 MED ORDER — BUSPIRONE HCL 5 MG PO TABS
7.5000 mg | ORAL_TABLET | Freq: Two times a day (BID) | ORAL | Status: DC
  Administered 2020-05-30 – 2020-05-31 (×4): 7.5 mg via ORAL
  Filled 2020-05-30 (×4): qty 2

## 2020-05-30 NOTE — Progress Notes (Addendum)
Spiritual care received call from pt's grand daughter.    Assisted pt in filling out documents for Advance Directive.  [place] daughter is coming to hospital this evening with notary to complete these documents and General POA.    Provided support and prayers at bedside with Mr Hubert, daughter and grand daughter

## 2020-05-30 NOTE — Consult Note (Addendum)
[facility] Nurse Consult Note: Reason for Consult: Consult requested for BLE.  Family member at the bedside states wounds were previously blisters and generalzied edema and have greatly improved. No further edema. Wound type: Left heel with stage 2 pressure injury; 1X1X.1cm, red and dry Left anterior foot with full thickness healing wound; 15% yellow, 85% red and moist, mod ant tan drainage, no odor Right anterior foot 100% red and dry; 3.5X3X.1cm, no odor or draiange.  Pressure Injury POA: Yes Dressing procedure/placement/frequency: Float heels to reduce pressure. Topical treatment orders provided for bedside nurses to perform as follows to promote moist healing: pply double-folded xeroform gauze to bilat feet wounds Q day, then cover with kerlex. Please re-consult if further assistance is needed.  Thank-you,  Julien Girt MSN, [place], [place], [place], [place]

## 2020-05-30 NOTE — Progress Notes (Signed)
Initial Nutrition Assessment  RD working remotely.  DOCUMENTATION CODES:   Not applicable  INTERVENTION:  - will order Hormel Shake once/day, each supplement provides 500 kcal and 22 grams protein. - will order Magic Cup BID with meals, each supplement provides 290 kcal and 9 grams of protein. - will order 30 ml Prosource Plus BID, each supplement provides 100 kcal and 15 grams protein.  - complete NFPE when feasible.    NUTRITION DIAGNOSIS:   Increased nutrient needs related to acute illness,wound healing as evidenced by estimated needs.  GOAL:   Patient will meet greater than or equal to 90% of their needs  MONITOR:   PO intake,Supplement acceptance,Labs,Weight trends,Skin  REASON FOR ASSESSMENT:   Malnutrition Screening Tool  ASSESSMENT:   80 year old male with medical history of CHF, stage 4 CKD, insulin-dependent type 2 DM, spinal stenosis s/p laminectomy 2017, afib, HLD, anxiety, CAD s/p CABG 1997, COPD, and OSA not on CPAP. He presented to the ED due to SOB, BLE edema, and weeping of bilateral feet wounds. He was discharged home from SNF on 05/27/20 after admission at [facility] 2/11-2/28.  Diet changed from Heart Healthy/Carb Modified, thin liquids to Dysphagia 2, nectar-thick liquids today at 0222; no intakes documented today.   He has not been assessed by a [place] RD since 01/20/2019.   Weight yesterday was 198 lb and weight on 03/19/20 as 216 lb. This indicates 18 lb weight loss (8.3% body weight) in the past 1.5 months; significant for time frame. In addition, noted moderate edema to BLE documented in the edema section of flow sheet which indicates weight is likely currently falsely elevated.   Per notes: - CAP - acute on chronic CHF - type 2 DM with most recent HgbA1c: 6% - stage 4 CKD   Labs reviewed; CBGs: 139 and 146 mg/dl, BUN: 31 mg/dl, creatinine: 2.69 mg/dl, Ca: 8.2 mg/dl, GFR: 23 ml/min.  Medications reviewed; 60 mg IV lasix BID, sliding scale  novolog, 20 units 70/30 novolog BID, 17 g miralax/day, 5 mg deltasone/day.    NUTRITION - FOCUSED PHYSICAL EXAM:  unable to complete at this time.  Diet Order:   Diet Order            DIET DYS 2 Room service appropriate? Yes; Fluid consistency: Nectar Thick  Diet effective now                 EDUCATION NEEDS:   No education needs have been identified at this time  Skin:  Skin Assessment: Skin Integrity Issues: Skin Integrity Issues:: Stage II Stage II: L heel  Last BM:  3/16  Height:   Ht Readings from Last 1 Encounters:  06/07/2020 5\' 11"  (1.803 m)    Weight:   Wt Readings from Last 1 Encounters:  05/22/2020 89.6 kg     Estimated Nutritional Needs:  Kcal:  1700-1900 kcal Protein:  80-90 grams Fluid:  >/= 1.5 L/day      Jarome Matin, MS, RD, LDN, CNSC Inpatient Clinical Dietitian RD pager # available in AMION  After hours/weekend pager # available in [facility]

## 2020-05-30 NOTE — Progress Notes (Signed)
The floor coverage (AMION) was notified for new orders.   New orders are in [facility].

## 2020-05-30 NOTE — Progress Notes (Signed)
PROGRESS NOTE    [name]  QQV:[id number] DOB: Jun 17, 1940 DOA: 05/19/2020 PCP: Verda Cumins, MD    Brief Narrative:  Jacob [name] is an 80 year old male with past medical history significant for chronic diastolic congestive heart failure, CKD stage IV, insulin-dependent diabetes mellitus type 2, spinal stenosis s/p laminectomy 2017, paroxysmal atrial fibrillation, hyperlipidemia, anxiety, CAD s/p CABG 1997, COPD, OSA not on CPAP who presented to [facility] with shortness of breath, lower extremity edema and weeping of wounds of bilateral feet.  Apparently, rapid onset of bilateral lower extremity edema following discharge from SNF on 3/14.  Patient with recent hospitalization at [facility] 2/11 through 2/28 for respiratory failure secondary to aspiration pneumonia, metabolic encephalopathy, acute renal failure, UTI and was subsequently discharged to SNF and ultimately discharged home on 3/14.  In the ED, temperature 97.5 F, HR 88, RR 25, BP 155/105, SPO2 96% on room air.  Sodium 143, potassium 4.7, chloride 109, CO2 22, glucose 164, BUN 32, creatinine 2.47, AST 33, ALT 15, total bilirubin 1.8.  BNP 4485.9.  WBC 6.5, hemoglobin 11.1, platelets 181.  Covid-19 test negative.  Urinalysis with small leukocytes, positive nitrite, rare bacteria, 0-5 WBCs.  Chest x-ray with cardiomegaly and pulmonary edema, small-moderate pleural effusions with basilar airspace disease worse on the left related to atelectasis versus pneumonia.  Hospital service consulted for further evaluation and management of acute on chronic diastolic congestive heart failure.   Assessment & Plan:   Principal Problem:   Acute on chronic diastolic CHF (congestive heart failure) (HCC) Active Problems:   Type 2 diabetes mellitus with diabetic polyneuropathy, with long-term current use of insulin (HCC)   COPD (chronic obstructive pulmonary disease) (HCC)   AF (paroxysmal atrial fibrillation) (HCC)    Coronary artery disease involving native heart without angina pectoris   CKD (chronic kidney disease), stage IV (HCC)   Prolonged QT interval   Mixed diabetic hyperlipidemia associated with type 2 diabetes mellitus (HCC)   Pressure ulcer of dorsum of foot   Sepsis, not present on admission Community acquired pneumonia Patient developed fever 103.0 F with decrease in mentation.  Chest x-ray suggestive of likely left lower lobe pneumonia.  Elevated procalcitonin of 0.96. --Ceftriaxone 2 g IV every 24 hours --Doxycycline 100 mg IV every 12 hours --Continue supple oxygen, maintain SPO2 > 92% --Trend procalcitonin --CBC daily  Acute on chronic diastolic congestive heart failure Patient presenting with progressive shortness of breath and lower extremity edema.  Was found to have an elevated BNP of 4 with chest x-ray findings consistent with pulmonary edema.  Requiring supplemental oxygen. --Furosemide 60 mg IV every 12 hours --Strict I's and O's and daily weights --Follow renal function closely daily given aggressive IV diuresis  Type 2 diabetes mellitus Hemoglobin A1c 6.0, well controlled.  Home regimen includes insulin 75/25 20-30u [place] BID, Semaglutide 0.5mg  inj weekly.  --Insulin 70/30 20u [place] BID --moderate dose SSI for coverage --CBGs qAC/HS  CKD stage IV Baseline creatinine 2.6 on 05/13/2020.  Creatinine on admission 2.47, at baseline. --Cr 2.47>2.69 --Avoid nephrotoxins, renally dose all medications --BMP daily  Paroxysmal atrial fibrillation --Metoprolol tartrate 50 mg p.o. twice daily --Eliquis 2.5 mg p.o. twice daily --Continue monitor on telemetry  Hyperlipidemia --Atorvastatin 10 mg p.o. daily  Depression: BuSpar 7.5 mg p.o. twice daily  OSA --CPAP nightly  BPH: Finasteride 5 mg p.o. daily   DVT prophylaxis: Eliquis   Code Status: Full Code Family Communication: updated patients daughter, Hilda Blades via telephone this  morning  Disposition Plan:  Level of care:  Telemetry Status is: Observation  The patient will require care spanning > 2 midnights and should be moved to inpatient because: Altered mental status, Ongoing diagnostic testing needed not appropriate for outpatient work up, Unsafe d/c plan, IV treatments appropriate due to intensity of illness or inability to take PO and Inpatient level of care appropriate due to severity of illness  Dispo: The patient is from: Home              Anticipated d/c is to: Home              Patient currently is not medically stable to d/c.   Difficult to place patient No  Consultants:   None  Procedures:   None  Antimicrobials:   Doxycycline 3/17>>  Ceftriaxone 3/17   Subjective: Patient seen and examined at bedside, resting comfortably.  Sleeping but arousable.  Feels fatigue and weak.  Nonproductive cough.  Fever up to 103.0 F this morning.  Now requiring supplemental oxygen.  Slightly confused this morning.  No other specific complaints or concerns at this time.  Denies headache, no visual changes, no chest pain, palpitations, no abdominal pain, no nausea/vomiting/diarrhea, no chills/night sweats.  Nursing concerned about his fever and lethargy this morning, otherwise no acute events overnight per nursing staff.  Objective: Vitals:   05/30/20 0300 05/30/20 0642 05/30/20 1026 05/30/20 1055  BP:  (!) 141/75 101/61 (!) 101/59  Pulse: 96 95 82 80  Resp:   (!) 22 15  Temp:  (!) 103 F (39.4 C) 98.3 F (36.8 C) (!) 100.9 F (38.3 C)  TempSrc:  Oral Oral Oral  SpO2: 98% 100% 98% 96%  Weight:      Height:        Intake/Output Summary (Last 24 hours) at 05/30/2020 1122 Last data filed at 05/30/2020 2157 Gross per 24 hour  Intake 100 ml  Output --  Net 100 ml   Filed Weights   06/06/2020 2241  Weight: 89.6 kg    Examination:  General exam: Appears calm and comfortable, ill in appearance Respiratory system: Breath sounds slightly decreased bilateral bases with crackles left base, normal  respiratory effort, on 3 L nasal cannula. (not on home O2) Cardiovascular system: S1 & S2 heard, RRR. No JVD, murmurs, rubs, gallops or clicks.  2+ pitting edema bilateral lower extremities up to knees Gastrointestinal system: Abdomen is nondistended, soft and nontender. No organomegaly or masses felt. Normal bowel sounds heard. Central nervous system: Alert. No focal neurological deficits. Extremities: Symmetric 5 x 5 power. Skin: No rashes, lesions or ulcers Psychiatry: Judgement and insight appear poor. Mood & affect appropriate.     Data Reviewed: I have personally reviewed following labs and imaging studies  CBC: Recent Labs  Lab 06/02/2020 1500 05/30/20 0436  WBC 6.5 9.4  NEUTROABS 5.2 8.7*  HGB 11.1* 10.7*  HCT 37.0* 36.4*  MCV 104.5* 106.1*  PLT 181 638   Basic Metabolic Panel: Recent Labs  Lab 06/07/2020 1500 05/30/20 0436  NA 143 144  K 4.7 3.9  CL 109 108  CO2 22 23  GLUCOSE 164* 108*  BUN 32* 31*  CREATININE 2.47* 2.69*  CALCIUM 8.4* 8.2*  MG  --  2.2   GFR: Estimated Creatinine Clearance: 23.3 mL/min (A) (by C-G formula based on SCr of 2.69 mg/dL (H)). Liver Function Tests: Recent Labs  Lab 05/17/2020 1500 05/30/20 0436  AST 33 15  ALT 15 13  ALKPHOS 115 103  BILITOT 1.8* 1.1  PROT 7.7 6.8  ALBUMIN 3.8 3.2*   No results for input(s): LIPASE, AMYLASE in the last 168 hours. No results for input(s): AMMONIA in the last 168 hours. Coagulation Profile: No results for input(s): INR, PROTIME in the last 168 hours. Cardiac Enzymes: No results for input(s): CKTOTAL, CKMB, CKMBINDEX, TROPONINI in the last 168 hours. BNP (last 3 results) No results for input(s): PROBNP in the last 8760 hours. HbA1C: Recent Labs    05/26/2020 2256  HGBA1C 6.0*   CBG: Recent Labs  Lab 05/26/2020 2120 05/30/20 0750  GLUCAP 126* 139*   Lipid Profile: No results for input(s): CHOL, HDL, LDLCALC, TRIG, CHOLHDL, LDLDIRECT in the last 72 hours. Thyroid Function Tests: No  results for input(s): TSH, T4TOTAL, FREET4, T3FREE, THYROIDAB in the last 72 hours. Anemia Panel: No results for input(s): VITAMINB12, FOLATE, FERRITIN, TIBC, IRON, RETICCTPCT in the last 72 hours. Sepsis Labs: Recent Labs  Lab 05/30/20 0947  PROCALCITON 0.96    Recent Results (from the past 240 hour(s))  SARS CORONAVIRUS 2 (TAT 6-24 HRS) Nasopharyngeal Nasopharyngeal Swab     Status: None   Collection Time: 06/02/2020  3:08 PM   Specimen: Nasopharyngeal Swab  Result Value Ref Range Status   SARS Coronavirus 2 NEGATIVE NEGATIVE Final    Comment: (NOTE) SARS-CoV-2 target nucleic acids are NOT DETECTED.  The SARS-CoV-2 RNA is generally detectable in upper and lower respiratory specimens during the acute phase of infection. Negative results do not preclude SARS-CoV-2 infection, do not rule out co-infections with other pathogens, and should not be used as the sole basis for treatment or other patient management decisions. Negative results must be combined with clinical observations, patient history, and epidemiological information. The expected result is Negative.  Fact Sheet for Patients: SugarRoll.be  Fact Sheet for Healthcare Providers: https://www.woods-mathews.com/  This test is not yet approved or cleared by the [place] FDA and  has been authorized for detection and/or diagnosis of SARS-CoV-2 by FDA under an Emergency Use Authorization (EUA). This EUA will remain  in effect (meaning this test can be used) for the duration of the COVID-19 declaration under Se ction 564(b)(1) of the Act, 21 U.S.C. section 360bbb-3(b)(1), unless the authorization is terminated or revoked sooner.  Performed at [facility], [address], [place], [place] [address]          Radiology Studies: DG Chest 2 View  Result Date: 06/02/2020 CLINICAL DATA:  Increasing shortness of breath over the past few days. EXAM: CHEST - 2 VIEW  COMPARISON:  Single-view of the chest 01/19/2019 in 11/21/2015. FINDINGS: There is cardiomegaly and interstitial pulmonary edema. Small to moderate pleural effusions and basilar airspace disease are much worse on the left. Aortic atherosclerosis is noted. No pneumothorax. No focal bony abnormality. IMPRESSION: Cardiomegaly and pulmonary edema. Small to moderate pleural effusions and basilar airspace disease, worse on the left. Airspace disease is likely due to atelectasis but could reflect pneumonia. Electronically Signed   By: Inge Rise M.D.   On: 05/14/2020 14:16        Scheduled Meds: . apixaban  2.5 mg Oral BID  . atorvastatin  20 mg Oral Daily  . busPIRone  7.5 mg Oral BID  . finasteride  5 mg Oral Daily  . furosemide  60 mg Intravenous BID  . insulin aspart  0-15 Units Subcutaneous TID AC & HS  . insulin aspart protamine- aspart  20 Units Subcutaneous BID WC  . latanoprost  1 drop Both  Eyes QHS  . loratadine  10 mg Oral Daily  . metoprolol tartrate  50 mg Oral BID  . pilocarpine  1 drop Both Eyes BID  . polyethylene glycol  17 g Oral Daily  . predniSONE  5 mg Oral Q breakfast  . pregabalin  50 mg Oral QHS  . sodium chloride flush  3 mL Intravenous Q12H  . traZODone  25 mg Oral QHS   Continuous Infusions: . sodium chloride    . cefTRIAXone (ROCEPHIN)  IV    . doxycycline (VIBRAMYCIN) IV 100 mg (05/30/20 1014)     LOS: 0 days    Time spent: 39 minutes spent on chart review, discussion with nursing staff, consultants, updating family and interview/physical exam; more than 50% of that time was spent in counseling and/or coordination of care.    Kadisha Goodine J [place] DO [facility] Available via Epic secure chat 7am-7pm After these hours, please refer to coverage provider listed on [email or web address] 05/30/2020, 11:22 AM

## 2020-05-31 DIAGNOSIS — I5033 Acute on chronic diastolic (congestive) heart failure: Secondary | ICD-10-CM

## 2020-05-31 DIAGNOSIS — Z7189 Other specified counseling: Secondary | ICD-10-CM

## 2020-05-31 DIAGNOSIS — Z515 Encounter for palliative care: Secondary | ICD-10-CM

## 2020-05-31 DIAGNOSIS — R531 Weakness: Secondary | ICD-10-CM | POA: Diagnosis not present

## 2020-05-31 DIAGNOSIS — J690 Pneumonitis due to inhalation of food and vomit: Secondary | ICD-10-CM | POA: Diagnosis present

## 2020-05-31 LAB — BASIC METABOLIC PANEL
Anion gap: 11 (ref 5–15)
BUN: 43 mg/dL — ABNORMAL HIGH (ref 8–23)
CO2: 24 mmol/L (ref 22–32)
Calcium: 7.8 mg/dL — ABNORMAL LOW (ref 8.9–10.3)
Chloride: 110 mmol/L (ref 98–111)
Creatinine, Ser: 3.28 mg/dL — ABNORMAL HIGH (ref 0.61–1.24)
GFR, Estimated: 18 mL/min — ABNORMAL LOW (ref >60)
Glucose, Bld: 192 mg/dL — ABNORMAL HIGH (ref 70–99)
Potassium: 4.1 mmol/L (ref 3.5–5.1)
Sodium: 145 mmol/L (ref 135–145)

## 2020-05-31 LAB — BLOOD GAS, ARTERIAL
Acid-base deficit: 7.2 mmol/L — ABNORMAL HIGH (ref 0.0–2.0)
Bicarbonate: 17.5 mmol/L — ABNORMAL LOW (ref 20.0–28.0)
O2 Saturation: 94.2 %
Patient temperature: 98.6
pCO2 arterial: 34.4 mmHg (ref 32.0–48.0)
pH, Arterial: 7.328 — ABNORMAL LOW (ref 7.350–7.450)
pO2, Arterial: 68.4 mmHg — ABNORMAL LOW (ref 83.0–108.0)

## 2020-05-31 LAB — CBC
HCT: 37.2 % — ABNORMAL LOW (ref 39.0–52.0)
Hemoglobin: 10.7 g/dL — ABNORMAL LOW (ref 13.0–17.0)
MCH: 31.4 pg (ref 26.0–34.0)
MCHC: 28.8 g/dL — ABNORMAL LOW (ref 30.0–36.0)
MCV: 109.1 fL — ABNORMAL HIGH (ref 80.0–100.0)
Platelets: 157 10*3/uL (ref 150–400)
RBC: 3.41 MIL/uL — ABNORMAL LOW (ref 4.22–5.81)
RDW: 18 % — ABNORMAL HIGH (ref 11.5–15.5)
WBC: 9.9 10*3/uL (ref 4.0–10.5)
nRBC: 0 % (ref 0.0–0.2)

## 2020-05-31 LAB — MAGNESIUM: Magnesium: 2.2 mg/dL (ref 1.7–2.4)

## 2020-05-31 LAB — EXPECTORATED SPUTUM ASSESSMENT W GRAM STAIN, RFLX TO RESP C

## 2020-05-31 LAB — PROCALCITONIN: Procalcitonin: 1.54 ng/mL

## 2020-05-31 LAB — GLUCOSE, CAPILLARY
Glucose-Capillary: 169 mg/dL — ABNORMAL HIGH (ref 70–99)
Glucose-Capillary: 234 mg/dL — ABNORMAL HIGH (ref 70–99)

## 2020-05-31 MED ORDER — SODIUM CHLORIDE 0.9 % IV BOLUS
500.0000 mL | Freq: Once | INTRAVENOUS | Status: AC
  Administered 2020-05-31: 500 mL via INTRAVENOUS

## 2020-05-31 MED ORDER — GLYCOPYRROLATE 0.2 MG/ML IJ SOLN: 0.2000 mg | INTRAMUSCULAR | Status: DC | PRN

## 2020-05-31 MED ORDER — POLYVINYL ALCOHOL 1.4 % OP SOLN
1.0000 [drp] | Freq: Four times a day (QID) | OPHTHALMIC | Status: DC | PRN
  Filled 2020-05-31: qty 15

## 2020-05-31 MED ORDER — LORAZEPAM 2 MG/ML PO CONC: 1.0000 mg | ORAL | Status: DC | PRN

## 2020-05-31 MED ORDER — ACETAMINOPHEN 650 MG RE SUPP: 650.0000 mg | Freq: Four times a day (QID) | RECTAL | Status: DC | PRN

## 2020-05-31 MED ORDER — SODIUM CHLORIDE 0.9 % IV SOLN: INTRAVENOUS | Status: DC

## 2020-05-31 MED ORDER — PIPERACILLIN-TAZOBACTAM IN DEX 2-0.25 GM/50ML IV SOLN
2.2500 g | Freq: Four times a day (QID) | INTRAVENOUS | Status: DC
  Filled 2020-05-31 (×2): qty 50

## 2020-05-31 MED ORDER — BIOTENE DRY MOUTH MT LIQD: 15.0000 mL | OROMUCOSAL | Status: DC | PRN

## 2020-05-31 MED ORDER — HYDROMORPHONE HCL 1 MG/ML IJ SOLN
0.5000 mg | INTRAMUSCULAR | Status: DC | PRN
  Administered 2020-05-31: 0.5 mg via INTRAVENOUS
  Filled 2020-05-31: qty 0.5

## 2020-05-31 MED ORDER — GLYCOPYRROLATE 1 MG PO TABS
1.0000 mg | ORAL_TABLET | ORAL | Status: DC | PRN
  Administered 2020-05-31: 1 mg via ORAL
  Filled 2020-05-31 (×2): qty 1

## 2020-05-31 MED ORDER — LORAZEPAM 1 MG PO TABS: 1.0000 mg | ORAL_TABLET | ORAL | Status: DC | PRN

## 2020-05-31 MED ORDER — ACETAMINOPHEN 325 MG PO TABS: 650.0000 mg | ORAL_TABLET | Freq: Four times a day (QID) | ORAL | Status: DC | PRN

## 2020-05-31 MED ORDER — LORAZEPAM 2 MG/ML IJ SOLN: 1.0000 mg | INTRAMUSCULAR | Status: DC | PRN

## 2020-06-01 LAB — URINE CULTURE: Culture: >=100000 — AB

## 2020-06-02 LAB — CULTURE, RESPIRATORY W GRAM STAIN

## 2020-06-04 LAB — CULTURE, BLOOD (ROUTINE X 2)
Culture: NO GROWTH
Culture: NO GROWTH
Special Requests: ADEQUATE

## 2020-06-13 ENCOUNTER — Ambulatory Visit: Payer: HMO | Admitting: Cardiology

## 2020-06-14 NOTE — Progress Notes (Signed)
All family members and wife left bedside.

## 2020-06-14 NOTE — Consult Note (Signed)
Consultation Note Date: June 09, 2020   Patient Name: Jacob [name]  DOB: Aug 13, 1940  MRN: [id number]  Age / Sex: 80 y.o., male  PCP: Verda Cumins, MD Referring Physician: [place] Eric J, DO  Reason for Consultation: Establishing goals of care  HPI/Patient Profile: 80 y.o. male  admitted on 05/30/2020    Clinical Assessment and Goals of Care: 80 year old gentleman who lives at home with his family.  Patient has longstanding chronic medical conditions of chronic diastolic congestive heart failure, stage IV CKD, diabetes, spinal stenosis status post laminectomy, paroxysmal atrial fibrillation, dyslipidemia anxiety.  Patient also has COPD, obstructive sleep apnea, status post CABG for coronary artery disease.  Patient was recently hospitalized at [facility] in February 2022 for respiratory failure secondary to aspiration pneumonia metabolic encephalopathy acute renal failure UTI and was subsequently discharged to skilled nursing facility and discharged home, patient admitted from home.  Patient admitted to hospital medicine service for acute on chronic diastolic congestive heart failure, COPD, sepsis, community-acquired versus aspiration pneumonia.  Patient was given IV antibiotics and diuresis.  Supplemental oxygen.  Is also on steroids.  Patient unfortunately has had continued ongoing decline in spite of above-mentioned interventions.  He has had fevers as high as 103 F.  Over the course of today 06-09-2020, the patient has noted to be less awake less alert.  He has low blood pressures.  He has coolness of lower extremities.  Palliative consult for goals of care discussions has been requested.  Mr. Davids is an elderly appearing gentleman resting in bed.  His eyes are closed and he is not awake and he is not alert.  He occasionally grunts and moans at times.  He has shallow respirations.  He does not follow  commands.  I met with patient's wife, daughter, granddaughter and son present at the bedside.  Introduced myself and palliative care as follows:  Palliative medicine is specialized medical care for people living with serious illness. It focuses on providing relief from the symptoms and stress of a serious illness. The goal is to improve quality of life for both the patient and the family.  Goals of care: Broad aims of medical therapy in relation to the patient's values and preferences. Our aim is to provide medical care aimed at enabling patients to achieve the goals that matter most to them, given the circumstances of their particular medical situation and their constraints.   Brief life review performed.  Patient's chronic medical conditions, hospitalization at outside facility last month, current hospitalization discussed in detail.  All of the family members patient's wife, daughter and granddaughter and son contributed to several different aspects of medical history taking, patient is not awake not alert nor conversational.  Goals wishes and values important to the patient and family as a unit attempted to be explored.  Discussed about current interventions that are being employed.  [name] states that they have had advance care planning discussions amongst themselves.  Patient has made his wishes known clearly that he would not want  to be artificially resuscitated, he would not want to be kept alive by artificial means, he would not want artificial nutrition and hydration at end-of-life.  Patient's family is concerned about avoiding suffering.  We discussed about common symptoms at end-of-life.  Patient has decreased responsive nests to visual and verbal stimuli, patient has shallow respirations, patient has coolness of peripheral extremities.  Discussed about his blood work and medications being given.  Goals wishes and values explored.  Overall, patient's family wishes to proceed with  comfort care going forward, see below.  Thank you for the consult.  HCPOA Wife, son and daughter present in the room.  Granddaughter also present in the room.  SUMMARY OF RECOMMENDATIONS   After thorough, reflective discussions regarding patient's current condition and overall goals of care, keeping in mind his previously expressed wishes, family wishes to proceed with full scope of comfort measures.  Anticipated hospital death.  End-of-life order set to be implemented.  Judicious use of opioids and other symptom management medications with comfort as a singular focus.  Prognosis Hours to days.  Continue to support patient and family as a unit.  Appreciate bedside nursing support.  Request chaplain support.   Code Status/Advance Care Planning:  DNR    Symptom Management:      Palliative Prophylaxis:   Delirium Protocol  Additional Recommendations (Limitations, Scope, Preferences):  Full Comfort Care  Psycho-social/Spiritual:   Desire for further Chaplaincy support:yes  Additional Recommendations: Caregiving  Support/Resources  Prognosis:   Hours to days.   Discharge Planning: Anticipated Hospital Death      Primary Diagnoses: Present on Admission: . Acute on chronic diastolic CHF (congestive heart failure) ([place]) . Prolonged QT interval . CKD (chronic kidney disease), stage IV ([place]) . Mixed diabetic hyperlipidemia associated with type 2 diabetes mellitus ([place]) . COPD (chronic obstructive pulmonary disease) ([place]) . Pressure ulcer of dorsum of foot . Community acquired bacterial pneumonia   I have reviewed the medical record, interviewed the patient and family, and examined the patient. The following aspects are pertinent.  Past Medical History:  Diagnosis Date  . Acute encephalopathy 08/29/2015  . Acute on chronic congestive heart failure ([place])   . Acute renal failure superimposed on stage 4 chronic kidney disease ([place]) 10/02/2015  . Adjustment disorder with  mixed emotional features 09/01/2015  . AKI (acute kidney injury) ([place])   . Allergy   . Anemia   . Anxiety   . Arthritis   . Asthma   . BPH (benign prostatic hyperplasia)   . CAD (coronary artery disease) 12/19/2011  . Cancer of kidney ([place])   . Cataract   . CHF (congestive heart failure) ([place])   . Chronic anticoagulation 08/22/2018  . Chronic diastolic heart failure ([place]) 11/27/2015  . Chronic kidney disease    chronic  kidney failure  kidney function at 42%  . CKD (chronic kidney disease) 11/27/2015  . COPD (chronic obstructive pulmonary disease) ([place])   . Coronary artery disease    CABG  7 bypasses  . Dehydration   . Diabetes mellitus without complication ([place])   . Elevated troponin 08/29/2015  . Essential hypertension 07/12/2015  . Facial cellulitis   . GERD (gastroesophageal reflux disease)   . Gout   . Hepatitis    many years ago  . High risk medication use 08/22/2018  . History of CHF (congestive heart failure)   . HNP (herniated nucleus pulposus), lumbar 07/24/2015  . Hyperlipidemia   . Hypertension   . Hypertensive heart disease 04/24/2018  .  Hypokalemia   . Hypotension 10/02/2015  . Lumbar disc disease   . Lumbar radiculopathy 05/05/2018  . Neuropathy ([place]) 12/19/2011  . Normocytic anemia 11/27/2015  . Obesity   . On amiodarone therapy 08/22/2018  . OSA (obstructive sleep apnea) 07/27/2015  . Paroxysmal atrial fibrillation (H[facility])   . Peripheral neuropathy   . PMR (polymyalgia rheumatica) ([place]) 09/09/2013  . Polymyalgia rheumatica (H[facility])    maintained on Prednisone, Plaquenil. Followed by rhuematology every 4 months/[name].  . Renal cell carcinoma ([place]) 07/23/2015  . Renal mass 07/12/2015  . Rheumatoid arthritis ([place]) 04/13/2012  . Shortness of breath dyspnea    with exertion  . Sleep apnea    CPAP   Trying to use  . Spinal stenosis of lumbar region with radiculopathy 08/07/2015  . Type 2 diabetes mellitus with other specified complication ([place]) 91/11/1658   Social History    Socioeconomic History  . Marital status: Married    Spouse name: Not on file  . Number of children: Not on file  . Years of education: Not on file  . Highest education level: Not on file  Occupational History  . Occupation: Geophysicist/field seismologist  Tobacco Use  . Smoking status: Former Smoker    Types: Cigarettes    Quit date: 03/16/1957    Years since quitting: 63.2  . Smokeless tobacco: Never Used  Vaping Use  . Vaping Use: Never used  Substance and Sexual Activity  . Alcohol use: No  . Drug use: No  . Sexual activity: Not Currently    Birth control/protection: None  Other Topics Concern  . Not on file  Social History Narrative   Marital status: Married x 55 years.      Children: 3 children; 5 grandchildren; 6 gg.      Lives: with wife.        Employment: retired; [employer].  Working every other week; 45 hours.      Tobacco:  Quit at age 60.      Alcohol:  None      Exercise: none      ADLs: indepedent with ADLs;  Uses cane and has a walker.  Drives.      Advanced Directives:  +Living Will.  DNR/DNI.  Wife is HCPOA.   Right hand   One story   Social Determinants of Health   Financial Resource Strain: Not on file  Food Insecurity: Not on file  Transportation Needs: Not on file  Physical Activity: Not on file  Stress: Not on file  Social Connections: Not on file   Family History  Problem Relation Age of Onset  . Hypertension Other   . Diabetes Brother   . Kidney failure Brother    Scheduled Meds: . (feeding supplement) PROSource Plus  30 mL Oral BID BM  . apixaban  2.5 mg Oral BID  . atorvastatin  20 mg Oral Daily  . busPIRone  7.5 mg Oral BID  . finasteride  5 mg Oral Daily  . insulin aspart  0-15 Units Subcutaneous TID AC & HS  . insulin aspart protamine- aspart  20 Units Subcutaneous BID WC  . latanoprost  1 drop Both Eyes QHS  . loratadine  10 mg Oral Daily  . multivitamin with minerals  1 tablet Oral Daily  . pilocarpine  1 drop Both Eyes  BID  . polyethylene glycol  17 g Oral Daily  . predniSONE  5 mg Oral Q breakfast  . pregabalin  50 mg Oral QHS  .  sodium chloride flush  3 mL Intravenous Q12H  . traZODone  25 mg Oral QHS   Continuous Infusions: . sodium chloride    . doxycycline (VIBRAMYCIN) IV 100 mg (2020/06/26 1032)  . piperacillin-tazobactam (ZOSYN)  IV     PRN Meds:.sodium chloride, acetaminophen, albuterol, nitroGLYCERIN, polyethylene glycol, sodium chloride flush Medications Prior to Admission:  Prior to Admission medications   Medication Sig Start Date End Date Taking? Authorizing Provider  acetaminophen (TYLENOL) 500 MG tablet Take 500 mg by mouth every 6 (six) hours as needed for moderate pain.   Yes [provider]  apixaban (ELIQUIS) 5 MG TABS tablet Take 5 mg by mouth 2 (two) times daily.   Yes [provider]  atorvastatin (LIPITOR) 20 MG tablet Take 20 mg by mouth daily.   Yes [provider]  busPIRone (BUSPAR) 7.5 MG tablet Take 7.5 mg by mouth 2 (two) times daily.   Yes [provider]  Cholecalciferol 50 MCG (2000 UT) CAPS Take 2,000 Units by mouth daily.   Yes [provider]  denosumab (PROLIA) 60 MG/ML SOSY injection Inject 60 mg into the skin every 6 (six) months.   Yes [provider]  ferrous sulfate 325 (65 FE) MG tablet Take 325 mg by mouth See admin instructions. Takes 1 tablet Mon, Wed, Fri   Yes [provider]  finasteride (PROSCAR) 5 MG tablet Take 5 mg by mouth daily.    Yes [provider]  furosemide (LASIX) 40 MG tablet TAKE 1 TABLET BY MOUTH TWICE A DAY Patient taking differently: Take 40 mg by mouth 2 (two) times daily. 03/18/20  Yes Gifford Shave, MD  insulin lispro protamine-lispro (HUMALOG 75/25 MIX) (75-25) 100 UNIT/ML SUSP injection Inject 20-30 Units into the skin 2 (two) times daily with a meal. Sliding scale   Yes [provider]  latanoprost (XALATAN) 0.005 % ophthalmic solution Place 1 drop into  both eyes at bedtime.   Yes [provider]  loratadine (CLARITIN) 10 MG tablet Take 10 mg by mouth daily.   Yes [provider]  metoprolol tartrate (LOPRESSOR) 50 MG tablet Take 50 mg by mouth 2 (two) times daily.   Yes [provider]  nitroGLYCERIN (NITROSTAT) 0.4 MG SL tablet Place 0.4 mg under the tongue every 5 (five) minutes as needed for chest pain.   Yes [provider]  ondansetron (ZOFRAN) 8 MG tablet Take 8 mg by mouth every 8 (eight) hours as needed for nausea or vomiting.   Yes [provider]  pilocarpine (PILOCAR) 1 % ophthalmic solution Place 1 drop into both eyes 2 (two) times daily.   Yes [provider]  polyethylene glycol (MIRALAX / GLYCOLAX) 17 g packet Take 17 g by mouth daily.   Yes [provider]  predniSONE (DELTASONE) 5 MG tablet Take 5 mg by mouth daily with breakfast.    Yes [provider]  pregabalin (LYRICA) 25 MG capsule Take 50 mg by mouth at bedtime.   Yes [provider]  SEMAGLUTIDE,0.25 OR 0.5MG/DOS, [place] Inject 0.5 mg into the skin once a week. Mondays   Yes [provider]  traZODone (DESYREL) 50 MG tablet Take 25 mg by mouth at bedtime.   Yes [provider]  amLODipine (NORVASC) 10 MG tablet Take 1 tablet (10 mg total) by mouth daily. Patient not taking: Reported on 05/24/2020 05/03/20   Berniece Salines, DO   Allergies  Allergen Reactions  . Ace Inhibitors Other (See Comments)  Probably nausea and vomiting per patient   . Actonel [Risedronate] Nausea And Vomiting  . Ciprocinonide [Fluocinolone] Other (See Comments)    Probably nausea and vomiting per patient  . Flunisolide Other (See Comments)    Probably nausea and vomiting per patient   . Gabapentin     Other reaction(s): Coordination problem, Other (See Comments) tremors   . Metformin And Related Other (See Comments)    Probably nausea and vomiting per patient   . Sertraline Other (See Comments)     Probably nausea and vomiting per patient   . Sulindac Other (See Comments)    Probably nausea and vomiting per patient   . Terazosin Other (See Comments)    Probably nausea and vomiting per patient   . Ciprofloxacin Nausea And Vomiting    Other reaction(s): Edema of oral soft tissues, Irritation of penis, Edema of oral soft tissues, Irritation of penis, GI Upset (intolerance)  . Oxycodone Nausea And Vomiting   Review of Systems Not awake not alert does not verbalize Physical Exam Elderly gentleman resting in bed Not awake not alert Does not verbalize Shallow breath sounds on auscultation Occasional grunting and moaning Patient has coolness of lower extremities 1-2+ generalized edema upper and lower extremities Abdomen not acutely distended Has some skin breakdown bilateral both heels Not able to feel radial pulse, patient with shallow respirations, grunting, occasionally also using abdominal accessory muscles of respiration.  Vital Signs: BP (!) 89/54 (BP Location: Right Arm)   Pulse 86   Temp 100.3 F (37.9 C) (Oral)   Resp 20   Ht _0  (1.803 m)   Wt 88.7 kg   SpO2 96%   BMI 27.28 kg/m  Pain Scale: 0-10   Pain Score: Asleep   SpO2: SpO2: 96 % O2 Device:SpO2: 96 % O2 Flow Rate: .O2 Flow Rate (L/min): 2 L/min  IO: Intake/output summary:   Intake/Output Summary (Last 24 hours) at 06-19-20 1518 Last data filed at 2020-06-19 0900 Gross per 24 hour  Intake 1197.07 ml  Output -  Net 1197.07 ml    LBM: Last BM Date: 05/22/2020 Baseline Weight: Weight: 89.6 kg Most recent weight: Weight: 88.7 kg     Palliative Assessment/Data:   PPS 20%  Time In:  1400 Time Out:  1510 Time Total:  70  Greater than 50%  of this time was spent counseling and coordinating care related to the above assessment and plan.  Signed by: Loistine Chance, MD   Please contact Palliative Medicine Team phone at [phone] for questions and concerns.  For individual provider: See  Shea Evans

## 2020-06-14 NOTE — Progress Notes (Signed)
PT Cancellation Note  Patient Details Name: Jacob [name] MRN: [id number] DOB: 31-Dec-1940   Cancelled Treatment:    Reason Eval/Treat Not Completed: PT screened, no needs identified, will sign off RN just in room with palliative care.  Pt now comfort measures.  Please re-order if new needs arise.   Franceska Strahm,KATHrine E 2020-06-21, 3:16 PM Jannette Spanner PT, DPT Acute Rehabilitation Services Pager: [phone] Office: [phone]

## 2020-06-14 NOTE — Progress Notes (Signed)
This chaplain contacted by pt grand daughter who requests support at pt's bedside.    Family consulted with palliative medicine and are proceeding with comfort care per Jacob [name] wishes.

## 2020-06-14 NOTE — Plan of Care (Signed)
  Problem: Clinical Measurements: Goal: Ability to maintain clinical measurements within normal limits will improve Outcome: Progressing Goal: Will remain free from infection Outcome: Progressing Goal: Respiratory complications will improve Outcome: Progressing   Problem: Activity: Goal: Risk for activity intolerance will decrease Outcome: Progressing   Problem: Nutrition: Goal: Adequate nutrition will be maintained Outcome: Progressing   Problem: Coping: Goal: Level of anxiety will decrease Outcome: Progressing   Problem: Elimination: Goal: Will not experience complications related to bowel motility Outcome: Progressing   Problem: Pain Managment: Goal: General experience of comfort will improve Outcome: Progressing   Problem: Safety: Goal: Ability to remain free from injury will improve Outcome: Progressing   Problem: Skin Integrity: Goal: Risk for impaired skin integrity will decrease Outcome: Progressing

## 2020-06-14 NOTE — Progress Notes (Signed)
Pharmacy Antibiotic Note  Jacob [name] is a 80 y.o. male admitted on 05/20/2020 with pneumonia. Patient initially placed on Ceftriaxone and Doxycyline. Given concern for aspiration, MD switching Ceftriaxone to Zosyn. Pharmacy has been consulted for Zosyn dosing.  Plan: Zosyn 2.25g IV q6h for CrCl < 20 ml/min Monitor renal function, cultures, clinical course  Height: 5\' 11"  (180.3 cm) Weight: 88.7 kg (195 lb 9.6 oz) IBW/kg (Calculated) : 75.3  Temp (24hrs), Avg:98.7 F (37.1 C), Min:98.2 F (36.8 C), Max:100.3 F (37.9 C)  Recent Labs  Lab 05/16/2020 1500 05/30/20 0436 Jun 29, 2020 0416  WBC 6.5 9.4 9.9  CREATININE 2.47* 2.69* 3.28*    Estimated Creatinine Clearance: 19.1 mL/min (A) (by C-G formula based on SCr of 3.28 mg/dL (H)).    Allergies  Allergen Reactions  . Ace Inhibitors Other (See Comments)    Probably nausea and vomiting per patient   . Actonel [Risedronate] Nausea And Vomiting  . Ciprocinonide [Fluocinolone] Other (See Comments)    Probably nausea and vomiting per patient  . Flunisolide Other (See Comments)    Probably nausea and vomiting per patient   . Gabapentin     Other reaction(s): Coordination problem, Other (See Comments) tremors   . Metformin And Related Other (See Comments)    Probably nausea and vomiting per patient   . Sertraline Other (See Comments)    Probably nausea and vomiting per patient   . Sulindac Other (See Comments)    Probably nausea and vomiting per patient   . Terazosin Other (See Comments)    Probably nausea and vomiting per patient   . Ciprofloxacin Nausea And Vomiting    Other reaction(s): Edema of oral soft tissues, Irritation of penis, Edema of oral soft tissues, Irritation of penis, GI Upset (intolerance)  . Oxycodone Nausea And Vomiting    Thank you for allowing pharmacy to be a part of this patient's care.  Luiz Ochoa 2020/06/29 1:28 PM

## 2020-06-14 NOTE — Death Summary Note (Signed)
DEATH SUMMARY   Patient Details  Name: Jacob [name] MRN: [id number] DOB: 08-27-1940  Admission/Discharge Information   Admit Date:  06/02/2020  Date of Death: Date of Death: 06/04/20  Time of Death: Time of Death: 06/10/1623  Length of Stay: 1  Referring Physician: Verda Cumins, MD   Reason(s) for Hospitalization  Shortness of breath  Diagnoses  Preliminary cause of death: Sepsis ([place]) Secondary Diagnoses (including complications and co-morbidities):  Principal Problem:   Acute on chronic diastolic CHF (congestive heart failure) ([place]) Active Problems:   Type 2 diabetes mellitus with diabetic polyneuropathy, with long-term current use of insulin (H[facility])   COPD (chronic obstructive pulmonary disease) ([place])   Sepsis ([place])   AF (paroxysmal atrial fibrillation) ([place])   Coronary artery disease involving native heart without angina pectoris   CKD (chronic kidney disease), stage IV ([place])   Prolonged QT interval   Mixed diabetic hyperlipidemia associated with type 2 diabetes mellitus ([place])   Pressure ulcer of dorsum of foot   Community acquired bacterial pneumonia   Aspiration pneumonia [facility])   Brief Hospital Course (including significant findings, care, treatment, and services provided and events leading to death)  Jacob [name] is a 80 y.o. year old male with past medical history significant for chronic diastolic congestive heart failure, CKD stage IV, insulin-dependent diabetes mellitus type 2, spinal stenosis s/p laminectomy June 10, 2015, paroxysmal atrial fibrillation, hyperlipidemia, anxiety, CAD s/p CABG 1995/06/10, COPD, OSA not on CPAP who presented to [facility] long ED with shortness of breath, lower extremity edema and weeping of wounds of bilateral feet.  Apparently, rapid onset of bilateral lower extremity edema following discharge from SNF on 3/14.  Patient with recent hospitalization at [facility] health 2/11 through 2/28 for respiratory failure secondary to aspiration pneumonia,  metabolic encephalopathy, acute renal failure, UTI and was subsequently discharged to SNF and ultimately discharged home on 3/14.  In the ED, temperature 97.5 F, HR 88, RR 25, BP 155/105, SPO2 96% on room air.  Sodium 143, potassium 4.7, chloride 109, CO2 22, glucose 164, BUN 32, creatinine 2.47, AST 33, ALT 15, total bilirubin 1.8.  BNP 4485.9.  WBC 6.5, hemoglobin 11.1, platelets 181.  Covid-19 test negative.  Urinalysis with small leukocytes, positive nitrite, rare bacteria, 0-5 WBCs.  Chest x-ray with cardiomegaly and pulmonary edema, small-moderate pleural effusions with basilar airspace disease worse on the left related to atelectasis versus pneumonia.  Hospital service consulted for further evaluation and management of acute on chronic diastolic congestive heart failure.   Hospital Course  Sepsis, not present on admission Urinary Tract infection Aspiration pneumonia Patient presented to the ED with progressive shortness of breath and lower extremity edema initially thought related to CHF exacerbation.  Shortly following admission, patient developed fever of 103.0 F with decreased mentation.  This with small leukocytes, positive nitrite, rare bacteria and 0-5 WBCs.  Chest x-ray suggestive of pneumonia, suspect aspiration given recent hospitalization for same vs UTI.  Patient's WBC count continue to trend up with elevated procalcitonin.  Patient was initially started on antibiotics with doxycycline and ceftriaxone and further escalated to doxycycline and Zosyn.  Despite aggressive measures, patient's clinical status continued to deteriorate.  Discussed this with his daughter and palliative care was consulted for further goals of care and medical decision making.  Given his significant decline despite aggressive measures, patient's family decided to transition his care to comfort measures.  Patient unfortunately passed away on 06-04-20 at 1625.  Family and chaplain were present at  bedside.  Acute on chronic diastolic congestive heart failure Patient presenting with progressive shortness of breath and lower extremity edema.  Was found to have an elevated BNP of 4 with chest x-ray findings consistent with pulmonary edema.  Requiring supplemental oxygen.  Started on IV furosemide for aggressive IV diuresis with good urine output, although unfortunately patient clinically declined during hospitalization due to sepsis from aspiration pneumonia as above.  Type 2 diabetes mellitus CKD stage IV Acute urinary retention Paroxysmal atrial fibrillation Hyperlipidemia Depression:  Obstructive sleep apnea BPH  OSA   Pertinent Labs and Studies  Significant Diagnostic Studies DG Chest 2 View  Result Date: 05/23/2020 CLINICAL DATA:  Increasing shortness of breath over the past few days. EXAM: CHEST - 2 VIEW COMPARISON:  Single-view of the chest 01/19/2019 in 11/21/2015. FINDINGS: There is cardiomegaly and interstitial pulmonary edema. Small to moderate pleural effusions and basilar airspace disease are much worse on the left. Aortic atherosclerosis is noted. No pneumothorax. No focal bony abnormality. IMPRESSION: Cardiomegaly and pulmonary edema. Small to moderate pleural effusions and basilar airspace disease, worse on the left. Airspace disease is likely due to atelectasis but could reflect pneumonia. Electronically Signed   By: Inge Rise M.D.   On: 06/06/2020 14:16    Microbiology Recent Results (from the past 240 hour(s))  SARS CORONAVIRUS 2 (TAT 6-24 HRS) Nasopharyngeal Nasopharyngeal Swab     Status: None   Collection Time: 05/22/2020  3:08 PM   Specimen: Nasopharyngeal Swab  Result Value Ref Range Status   SARS Coronavirus 2 NEGATIVE NEGATIVE Final    Comment: (NOTE) SARS-CoV-2 target nucleic acids are NOT DETECTED.  The SARS-CoV-2 RNA is generally detectable in upper and lower respiratory specimens during the acute phase of infection. Negative results do not  preclude SARS-CoV-2 infection, do not rule out co-infections with other pathogens, and should not be used as the sole basis for treatment or other patient management decisions. Negative results must be combined with clinical observations, patient history, and epidemiological information. The expected result is Negative.  Fact Sheet for Patients: SugarRoll.be  Fact Sheet for Healthcare Providers: https://www.woods-mathews.com/  This test is not yet approved or cleared by the [place] FDA and  has been authorized for detection and/or diagnosis of SARS-CoV-2 by FDA under an Emergency Use Authorization (EUA). This EUA will remain  in effect (meaning this test can be used) for the duration of the COVID-19 declaration under Se ction 564(b)(1) of the Act, 21 U.S.C. section 360bbb-3(b)(1), unless the authorization is terminated or revoked sooner.  Performed at [facility], [name] [address] [place], [place] [address]   Urine culture     Status: Abnormal (Preliminary result)   Collection Time: 06/12/2020  4:00 PM   Specimen: Urine, Clean Catch  Result Value Ref Range Status   Specimen Description   Final    URINE, CLEAN CATCH Performed at [facility], [place] [address], [place], [place] [address]    Special Requests   Final    NONE Performed at [facility], [place] [address] [place], [place] [address]    Culture (A)  Final    >=100,000 COLONIES/mL PSEUDOMONAS AERUGINOSA SUSCEPTIBILITIES TO FOLLOW Performed at [facility], [place] [address], [place], [place] [address]    Report Status PENDING  Incomplete  Culture, blood (routine x 2)     Status: None (Preliminary result)   Collection Time: 05/30/20  9:47 AM   Specimen: BLOOD  Result Value Ref Range Status  Specimen Description   Final    BLOOD BLOOD LEFT FOREARM Performed at [place] [address] [place],  [place] [address]    Special Requests   Final    BOTTLES DRAWN AEROBIC ONLY Blood Culture results may not be optimal due to an inadequate volume of blood received in culture bottles Performed at [place] [address], [place], [place] [address]    Culture   Final    NO GROWTH < 24 HOURS Performed at [place] [address], [place], [place] [address]    Report Status PENDING  Incomplete  Culture, blood (routine x 2)     Status: None (Preliminary result)   Collection Time: 05/30/20 10:04 AM   Specimen: BLOOD LEFT HAND  Result Value Ref Range Status   Specimen Description   Final    BLOOD LEFT HAND Performed at [place] [address], [place], [place] [address]    Special Requests   Final    BOTTLES DRAWN AEROBIC AND ANAEROBIC Blood Culture adequate volume Performed at [place] [address] [place], [place] [address]    Culture   Final    NO GROWTH < 24 HOURS Performed at [place] [address], [place], [place] [address]    Report Status PENDING  Incomplete  Expectorated Sputum Assessment w Gram Stain, Rflx to Resp Cult     Status: None   Collection Time: 05-Jun-2020  7:21 AM   Specimen: Expectorated Sputum  Result Value Ref Range Status   Specimen Description EXPECTORATED SPUTUM  Final   Special Requests NONE  Final   Sputum evaluation   Final    Sputum specimen not acceptable for testing.  Please recollect.   HOFFMAN,S. RN @0811  ON June 05, 2020 BY [facility] Performed at [facility], [place] [address], [place], [place] [address]    Report Status 06-05-2020 FINAL  Final  Expectorated Sputum Assessment w Gram Stain, Rflx to Resp Cult     Status: None   Collection Time: 06/05/2020 11:50 AM   Specimen: Expectorated Sputum  Result Value Ref Range Status   Specimen Description EXPECTORATED SPUTUM  Final   Special Requests NONE  Final   Sputum evaluation   Final    THIS SPECIMEN IS  ACCEPTABLE FOR SPUTUM CULTURE Performed at [facility], [place] [address], [place], [place] [address]    Report Status 06-05-20 FINAL  Final    Lab Basic Metabolic Panel: Recent Labs  Lab 06/10/2020 1500 05/30/20 0436 06/05/20 0416  NA 143 144 145  K 4.7 3.9 4.1  CL 109 108 110  CO2 22 23 24   GLUCOSE 164* 108* 192*  BUN 32* 31* 43*  CREATININE 2.47* 2.69* 3.28*  CALCIUM 8.4* 8.2* 7.8*  MG  --  2.2 2.2   Liver Function Tests: Recent Labs  Lab 05/25/2020 1500 05/30/20 0436  AST 33 15  ALT 15 13  ALKPHOS 115 103  BILITOT 1.8* 1.1  PROT 7.7 6.8  ALBUMIN 3.8 3.2*   No results for input(s): LIPASE, AMYLASE in the last 168 hours. No results for input(s): AMMONIA in the last 168 hours. CBC: Recent Labs  Lab 05/17/2020 1500 05/30/20 0436 June 05, 2020 0416  WBC 6.5 9.4 9.9  NEUTROABS 5.2 8.7*  --   HGB 11.1* 10.7* 10.7*  HCT 37.0* 36.4* 37.2*  MCV 104.5* 106.1* 109.1*  PLT 181 176 157   Cardiac Enzymes: No results  for input(s): CKTOTAL, CKMB, CKMBINDEX, TROPONINI in the last 168 hours. Sepsis Labs: Recent Labs  Lab 06/07/2020 1500 05/30/20 0436 05/30/20 0947 06-14-2020 0416  PROCALCITON  --   --  0.96 1.54  WBC 6.5 9.4  --  9.9    Procedures/Operations     Eric J [place] DO 06-14-2020, 4:40 PM

## 2020-06-14 NOTE — Progress Notes (Signed)
Family members at bedside

## 2020-06-14 NOTE — Progress Notes (Signed)
PROGRESS NOTE    Jacob [name]  URK:[id number] DOB: 07/02/1940 DOA: 06/11/2020 PCP: Verda Cumins, MD    Brief Narrative:  Jacob [name] is an 80 year old male with past medical history significant for chronic diastolic congestive heart failure, CKD stage IV, insulin-dependent diabetes mellitus type 2, spinal stenosis s/p laminectomy 2017, paroxysmal atrial fibrillation, hyperlipidemia, anxiety, CAD s/p CABG 1997, COPD, OSA not on CPAP who presented to [facility] ED with shortness of breath, lower extremity edema and weeping of wounds of bilateral feet.  Apparently, rapid onset of bilateral lower extremity edema following discharge from SNF on 3/14.  Patient with recent hospitalization at [facility] health 2/11 through 2/28 for respiratory failure secondary to aspiration pneumonia, metabolic encephalopathy, acute renal failure, UTI and was subsequently discharged to SNF and ultimately discharged home on 3/14.  In the ED, temperature 97.5 F, HR 88, RR 25, BP 155/105, SPO2 96% on room air.  Sodium 143, potassium 4.7, chloride 109, CO2 22, glucose 164, BUN 32, creatinine 2.47, AST 33, ALT 15, total bilirubin 1.8.  BNP 4485.9.  WBC 6.5, hemoglobin 11.1, platelets 181.  Covid-19 test negative.  Urinalysis with small leukocytes, positive nitrite, rare bacteria, 0-5 WBCs.  Chest x-ray with cardiomegaly and pulmonary edema, small-moderate pleural effusions with basilar airspace disease worse on the left related to atelectasis versus pneumonia.  Hospital service consulted for further evaluation and management of acute on chronic diastolic congestive heart failure.   Assessment & Plan:   Principal Problem:   Acute on chronic diastolic CHF (congestive heart failure) (HCC) Active Problems:   Type 2 diabetes mellitus with diabetic polyneuropathy, with long-term current use of insulin (HCC)   COPD (chronic obstructive pulmonary disease) (HCC)   AF (paroxysmal atrial fibrillation) (HCC)    Coronary artery disease involving native heart without angina pectoris   CKD (chronic kidney disease), stage IV (HCC)   Prolonged QT interval   Mixed diabetic hyperlipidemia associated with type 2 diabetes mellitus (HCC)   Pressure ulcer of dorsum of foot   Community acquired bacterial pneumonia   Sepsis, not present on admission Community acquired versus aspiration pneumonia Patient developed fever 103.0 F with decrease in mentation.  Chest x-ray suggestive of likely left lower lobe pneumonia.  Elevated procalcitonin of 0.96. --WBC 6.5>9.4>9.9 --Procalcitonin 0.96>1.54 --Change ceftriaxone to Zosyn for concern of aspiration --Doxycycline 100 mg IV every 12 hours --Continue supple oxygen, maintain SPO2 > 88%, on 3 L nasal cannula with SPO2 100% --Trend procalcitonin --CBC daily  Acute on chronic diastolic congestive heart failure Patient presenting with progressive shortness of breath and lower extremity edema.  Was found to have an elevated BNP of 4 with chest x-ray findings consistent with pulmonary edema.  Requiring supplemental oxygen. --Furosemide 60 mg IV every 12 hours --Strict I's and O's and daily weights --Follow renal function closely daily given aggressive IV diuresis  Type 2 diabetes mellitus Hemoglobin A1c 6.0, well controlled.  Home regimen includes insulin 75/25 20-30u [place] BID, Semaglutide 0.5mg  inj weekly.  --Insulin 70/30 20u [place] BID --moderate dose SSI for coverage --CBGs qAC/HS  CKD stage IV Baseline creatinine 2.6 on 05/13/2020.  Creatinine on admission 2.47, at baseline. --Cr 2.47>2.69>3.28 --Avoid nephrotoxins, renally dose all medications --BMP daily  Acute urinary retention --Foley catheter placed 3/18  Paroxysmal atrial fibrillation --Holding metoprolol for borderline hypotension --Eliquis 2.5 mg p.o. twice daily --Continue monitor on telemetry  Hyperlipidemia --Atorvastatin 10 mg p.o. daily  Depression: BuSpar 7.5 mg p.o. twice  daily  OSA --CPAP  nightly  BPH: Finasteride 5 mg p.o. daily  Ethics: Now with recurrent hospitalization for encephalopathy, aspiration pneumonia.  Just recently discharged from SNF and returns to the ED in 1 day.  Continues to slowly decline despite aggressive treatment with IV antibiotics and diuretics.  Discussed concern with daughter, Neoma Laming at bedside that may need to consider a more palliative/comfort approach if patient continues to decline.  Spouse stated that her husband would not want resuscitative efforts on 06-02-2020 and CODE STATUS changed to DNR. --Palliative care consult for further assistance with goals of care and medical decision making.   DVT prophylaxis: Eliquis   Code Status: DNR Family Communication: updated patients daughter, Hilda Blades at bedside this afternoon  Disposition Plan:  Level of care: Telemetry Status is: Inpatient  Remains inpatient appropriate because:Altered mental status, Ongoing diagnostic testing needed not appropriate for outpatient work up, Unsafe d/c plan, IV treatments appropriate due to intensity of illness or inability to take PO and Inpatient level of care appropriate due to severity of illness   Dispo: The patient is from: Home              Anticipated d/c is to: Home              Patient currently is not medically stable to d/c.   Difficult to place patient No   Consultants:   Palliative care  Procedures:   None  Antimicrobials:   Doxycycline 3/17>>  Ceftriaxone 3/17 - 3/18  Zosyn 3/18>>   Subjective: Patient seen and examined at bedside, resting comfortably.  Sleeping but arousable.  Feels fatigue and weak.  Nonproductive cough.  Fever up to 103.0 F this morning.  Now requiring supplemental oxygen.  Slightly confused this morning.  No other specific complaints or concerns at this time.  Denies headache, no visual changes, no chest pain, palpitations, no abdominal pain, no nausea/vomiting/diarrhea, no chills/night sweats.   Nursing concerned about his fever and lethargy this morning, otherwise no acute events overnight per nursing staff.  Objective: Vitals:   June 02, 2020 0100 2020-06-02 0430 June 02, 2020 0937 06-02-2020 1217  BP:  104/73 107/68 (!) 89/54  Pulse:  90 92 86  Resp:  20 (!) 24 20  Temp:  98.2 F (36.8 C) 98.4 F (36.9 C) 100.3 F (37.9 C)  TempSrc:  Oral Oral Oral  SpO2: 97% 100% 100% 96%  Weight:  88.7 kg    Height:        Intake/Output Summary (Last 24 hours) at 2020-06-02 1416 Last data filed at 06-02-20 0900 Gross per 24 hour  Intake 1197.07 ml  Output -  Net 1197.07 ml   Filed Weights   05/17/2020 2241 06-02-20 0430  Weight: 89.6 kg 88.7 kg    Examination:  General exam: Appears calm and comfortable, chronically ill in appearance Respiratory system: Breath sounds slightly decreased bilateral bases with crackles left base, normal respiratory effort, on 3 L nasal cannula with SPO2 100%. (not on home O2) Cardiovascular system: S1 & S2 heard, RRR. No JVD, murmurs, rubs, gallops or clicks.  1+ pitting edema bilateral lower extremities up to knees Gastrointestinal system: Abdomen is nondistended, soft and nontender. No organomegaly or masses felt. Normal bowel sounds heard. Central nervous system: Alert. No focal neurological deficits. Extremities: Symmetric 5 x 5 power. Skin: No rashes, lesions or ulcers Psychiatry: Judgement and insight appear poor. Mood & affect appropriate.     Data Reviewed: I have personally reviewed following labs and imaging studies  CBC: Recent Labs  Lab 06/10/2020 1500  05/30/20 0436 06-27-20 0416  WBC 6.5 9.4 9.9  NEUTROABS 5.2 8.7*  --   HGB 11.1* 10.7* 10.7*  HCT 37.0* 36.4* 37.2*  MCV 104.5* 106.1* 109.1*  PLT 181 176 154   Basic Metabolic Panel: Recent Labs  Lab 05/22/2020 1500 05/30/20 0436 Jun 27, 2020 0416  NA 143 144 145  K 4.7 3.9 4.1  CL 109 108 110  CO2 22 23 24   GLUCOSE 164* 108* 192*  BUN 32* 31* 43*  CREATININE 2.47* 2.69* 3.28*   CALCIUM 8.4* 8.2* 7.8*  MG  --  2.2 2.2   GFR: Estimated Creatinine Clearance: 19.1 mL/min (A) (by C-G formula based on SCr of 3.28 mg/dL (H)). Liver Function Tests: Recent Labs  Lab 06/08/2020 1500 05/30/20 0436  AST 33 15  ALT 15 13  ALKPHOS 115 103  BILITOT 1.8* 1.1  PROT 7.7 6.8  ALBUMIN 3.8 3.2*   No results for input(s): LIPASE, AMYLASE in the last 168 hours. No results for input(s): AMMONIA in the last 168 hours. Coagulation Profile: No results for input(s): INR, PROTIME in the last 168 hours. Cardiac Enzymes: No results for input(s): CKTOTAL, CKMB, CKMBINDEX, TROPONINI in the last 168 hours. BNP (last 3 results) No results for input(s): PROBNP in the last 8760 hours. HbA1C: Recent Labs    05/27/2020 2256  HGBA1C 6.0*   CBG: Recent Labs  Lab 05/30/20 1130 05/30/20 1637 05/30/20 2139 27-Jun-2020 0755 June 27, 2020 1122  GLUCAP 146* 140* 112* 169* 234*   Lipid Profile: No results for input(s): CHOL, HDL, LDLCALC, TRIG, CHOLHDL, LDLDIRECT in the last 72 hours. Thyroid Function Tests: No results for input(s): TSH, T4TOTAL, FREET4, T3FREE, THYROIDAB in the last 72 hours. Anemia Panel: No results for input(s): VITAMINB12, FOLATE, FERRITIN, TIBC, IRON, RETICCTPCT in the last 72 hours. Sepsis Labs: Recent Labs  Lab 05/30/20 0947 27-Jun-2020 0416  PROCALCITON 0.96 1.54    Recent Results (from the past 240 hour(s))  SARS CORONAVIRUS 2 (TAT 6-24 HRS) Nasopharyngeal Nasopharyngeal Swab     Status: None   Collection Time: 06/12/2020  3:08 PM   Specimen: Nasopharyngeal Swab  Result Value Ref Range Status   SARS Coronavirus 2 NEGATIVE NEGATIVE Final    Comment: (NOTE) SARS-CoV-2 target nucleic acids are NOT DETECTED.  The SARS-CoV-2 RNA is generally detectable in upper and lower respiratory specimens during the acute phase of infection. Negative results do not preclude SARS-CoV-2 infection, do not rule out co-infections with other pathogens, and should not be used as  the sole basis for treatment or other patient management decisions. Negative results must be combined with clinical observations, patient history, and epidemiological information. The expected result is Negative.  Fact Sheet for Patients: SugarRoll.be  Fact Sheet for Healthcare Providers: https://www.woods-mathews.com/  This test is not yet approved or cleared by the [place] FDA and  has been authorized for detection and/or diagnosis of SARS-CoV-2 by FDA under an Emergency Use Authorization (EUA). This EUA will remain  in effect (meaning this test can be used) for the duration of the COVID-19 declaration under Se ction 564(b)(1) of the Act, 21 U.S.C. section 360bbb-3(b)(1), unless the authorization is terminated or revoked sooner.  Performed at [facility], [place] [address] [place], [place] [address]   Urine culture     Status: Abnormal (Preliminary result)   Collection Time: 05/28/2020  4:00 PM   Specimen: Urine, Clean Catch  Result Value Ref Range Status   Specimen Description   Final    URINE, CLEAN CATCH Performed at  [facility], [place] [address] [place], [place] [address]    Special Requests   Final    NONE Performed at [facility], [place] [address], [place], [place] [address]    Culture (A)  Final    >=100,000 COLONIES/mL PSEUDOMONAS AERUGINOSA SUSCEPTIBILITIES TO FOLLOW Performed at [facility], [place] [address] [place], [place] [address]    Report Status PENDING  Incomplete  Culture, blood (routine x 2)     Status: None (Preliminary result)   Collection Time: 05/30/20  9:47 AM   Specimen: BLOOD  Result Value Ref Range Status   Specimen Description   Final    BLOOD BLOOD LEFT FOREARM Performed at [place] [address] [place], [place] [address]    Special Requests   Final    BOTTLES DRAWN AEROBIC ONLY Blood Culture results may not be optimal  due to an inadequate volume of blood received in culture bottles Performed at [place] [address] [place], [place] [address]    Culture   Final    NO GROWTH < 24 HOURS Performed at [place] [address], [place], [place] [address]    Report Status PENDING  Incomplete  Culture, blood (routine x 2)     Status: None (Preliminary result)   Collection Time: 05/30/20 10:04 AM   Specimen: BLOOD LEFT HAND  Result Value Ref Range Status   Specimen Description   Final    BLOOD LEFT HAND Performed at [place] [address] [place], [place] [address]    Special Requests   Final    BOTTLES DRAWN AEROBIC AND ANAEROBIC Blood Culture adequate volume Performed at [place] [address], [place], [place] [address]    Culture   Final    NO GROWTH < 24 HOURS Performed at [place] [address], [place], [place] [address]    Report Status PENDING  Incomplete  Expectorated Sputum Assessment w Gram Stain, Rflx to Resp Cult     Status: None   Collection Time: Jun 16, 2020  7:21 AM   Specimen: Expectorated Sputum  Result Value Ref Range Status   Specimen Description EXPECTORATED SPUTUM  Final   Special Requests NONE  Final   Sputum evaluation   Final    Sputum specimen not acceptable for testing.  Please recollect.   HOFFMAN,S. RN @0811  ON 06-16-20 BY [facility] Performed at [facility], [place] [address] [place], [place] [address]    Report Status Jun 16, 2020 FINAL  Final  Expectorated Sputum Assessment w Gram Stain, Rflx to Resp Cult     Status: None   Collection Time: 06/16/20 11:50 AM   Specimen: Expectorated Sputum  Result Value Ref Range Status   Specimen Description EXPECTORATED SPUTUM  Final   Special Requests NONE  Final   Sputum evaluation   Final    THIS SPECIMEN IS ACCEPTABLE FOR SPUTUM CULTURE Performed at [facility], [place] [address] [place],  [place] [address]    Report Status 06/16/2020 FINAL  Final         Radiology Studies: No results found.      Scheduled Meds: . (feeding supplement) PROSource Plus  30 mL Oral BID BM  . apixaban  2.5 mg Oral BID  . atorvastatin  20 mg Oral Daily  . busPIRone  7.5 mg Oral BID  . finasteride  5 mg Oral Daily  .  insulin aspart  0-15 Units Subcutaneous TID AC & HS  . insulin aspart protamine- aspart  20 Units Subcutaneous BID WC  . latanoprost  1 drop Both Eyes QHS  . loratadine  10 mg Oral Daily  . multivitamin with minerals  1 tablet Oral Daily  . pilocarpine  1 drop Both Eyes BID  . polyethylene glycol  17 g Oral Daily  . predniSONE  5 mg Oral Q breakfast  . pregabalin  50 mg Oral QHS  . sodium chloride flush  3 mL Intravenous Q12H  . traZODone  25 mg Oral QHS   Continuous Infusions: . sodium chloride    . doxycycline (VIBRAMYCIN) IV 100 mg (06-10-20 1032)  . piperacillin-tazobactam (ZOSYN)  IV       LOS: 1 day    Time spent: 39 minutes spent on chart review, discussion with nursing staff, consultants, updating family and interview/physical exam; more than 50% of that time was spent in counseling and/or coordination of care.    Eric J [place] DO [facility] Available via Epic secure chat 7am-7pm After these hours, please refer to coverage provider listed on [email or web address] 06/10/2020, 2:16 PM

## 2020-06-14 NOTE — Progress Notes (Signed)
Patient expired at 1625. Family at bedside, chaplin called and comforted family.Marland Kitchen

## 2020-06-14 DEATH — deceased
